# Patient Record
Sex: Female | Born: 1965
Health system: Southern US, Community
[De-identification: ages and names within clinical notes are randomized; demographics above are authoritative.]

## PROBLEM LIST (undated history)

## (undated) DIAGNOSIS — F32A Depression, unspecified: Secondary | ICD-10-CM

## (undated) DIAGNOSIS — M545 Low back pain, unspecified: Secondary | ICD-10-CM

## (undated) DIAGNOSIS — K219 Gastro-esophageal reflux disease without esophagitis: Secondary | ICD-10-CM

## (undated) DIAGNOSIS — I10 Essential (primary) hypertension: Secondary | ICD-10-CM

## (undated) DIAGNOSIS — J189 Pneumonia, unspecified organism: Secondary | ICD-10-CM

## (undated) DIAGNOSIS — Z8719 Personal history of other diseases of the digestive system: Secondary | ICD-10-CM

## (undated) DIAGNOSIS — M1990 Unspecified osteoarthritis, unspecified site: Secondary | ICD-10-CM

## (undated) DIAGNOSIS — K279 Peptic ulcer, site unspecified, unspecified as acute or chronic, without hemorrhage or perforation: Secondary | ICD-10-CM

## (undated) DIAGNOSIS — J449 Chronic obstructive pulmonary disease, unspecified: Secondary | ICD-10-CM

## (undated) DIAGNOSIS — R011 Cardiac murmur, unspecified: Secondary | ICD-10-CM

## (undated) DIAGNOSIS — Z720 Tobacco use: Secondary | ICD-10-CM

## (undated) DIAGNOSIS — E7800 Pure hypercholesterolemia, unspecified: Secondary | ICD-10-CM

## (undated) DIAGNOSIS — G8929 Other chronic pain: Secondary | ICD-10-CM

## (undated) DIAGNOSIS — R109 Unspecified abdominal pain: Secondary | ICD-10-CM

## (undated) DIAGNOSIS — I639 Cerebral infarction, unspecified: Secondary | ICD-10-CM

## (undated) DIAGNOSIS — K209 Esophagitis, unspecified: Secondary | ICD-10-CM

## (undated) DIAGNOSIS — K449 Diaphragmatic hernia without obstruction or gangrene: Secondary | ICD-10-CM

## (undated) DIAGNOSIS — K2270 Barrett's esophagus without dysplasia: Secondary | ICD-10-CM

## (undated) DIAGNOSIS — F329 Major depressive disorder, single episode, unspecified: Secondary | ICD-10-CM

## (undated) DIAGNOSIS — K861 Other chronic pancreatitis: Secondary | ICD-10-CM

## (undated) DIAGNOSIS — R0600 Dyspnea, unspecified: Secondary | ICD-10-CM

## (undated) HISTORY — DX: Cerebral infarction, unspecified: I63.9

## (undated) HISTORY — PX: FRACTURE SURGERY: SHX138

## (undated) HISTORY — PX: KNEE ARTHROSCOPY: SHX127

## (undated) HISTORY — PX: CARDIAC CATHETERIZATION: SHX172

## (undated) HISTORY — PX: PATELLA FRACTURE SURGERY: SHX735

## (undated) HISTORY — PX: LAPAROSCOPIC CHOLECYSTECTOMY: SUR755

---

## 1990-12-26 HISTORY — PX: TUBAL LIGATION: SHX77

## 2001-07-17 ENCOUNTER — Emergency Department (HOSPITAL_COMMUNITY): Admission: EM | Admit: 2001-07-17 | Discharge: 2001-07-18 | Payer: Self-pay | Admitting: *Deleted

## 2002-08-04 ENCOUNTER — Emergency Department (HOSPITAL_COMMUNITY): Admission: EM | Admit: 2002-08-04 | Discharge: 2002-08-04 | Payer: Self-pay | Admitting: Emergency Medicine

## 2003-06-13 ENCOUNTER — Emergency Department (HOSPITAL_COMMUNITY): Admission: EM | Admit: 2003-06-13 | Discharge: 2003-06-13 | Payer: Self-pay | Admitting: Emergency Medicine

## 2004-10-09 ENCOUNTER — Emergency Department (HOSPITAL_COMMUNITY): Admission: EM | Admit: 2004-10-09 | Discharge: 2004-10-09 | Payer: Self-pay | Admitting: Emergency Medicine

## 2005-03-17 ENCOUNTER — Inpatient Hospital Stay (HOSPITAL_COMMUNITY): Admission: EM | Admit: 2005-03-17 | Discharge: 2005-03-19 | Payer: Self-pay | Admitting: Emergency Medicine

## 2005-05-02 ENCOUNTER — Emergency Department (HOSPITAL_COMMUNITY): Admission: EM | Admit: 2005-05-02 | Discharge: 2005-05-03 | Payer: Self-pay | Admitting: Emergency Medicine

## 2005-07-13 ENCOUNTER — Emergency Department (HOSPITAL_COMMUNITY): Admission: EM | Admit: 2005-07-13 | Discharge: 2005-07-13 | Payer: Self-pay | Admitting: Emergency Medicine

## 2005-07-19 ENCOUNTER — Emergency Department (HOSPITAL_COMMUNITY): Admission: EM | Admit: 2005-07-19 | Discharge: 2005-07-19 | Payer: Self-pay | Admitting: Emergency Medicine

## 2005-08-15 ENCOUNTER — Emergency Department (HOSPITAL_COMMUNITY): Admission: EM | Admit: 2005-08-15 | Discharge: 2005-08-15 | Payer: Self-pay | Admitting: Emergency Medicine

## 2005-10-11 ENCOUNTER — Emergency Department (HOSPITAL_COMMUNITY): Admission: EM | Admit: 2005-10-11 | Discharge: 2005-10-12 | Payer: Self-pay | Admitting: Emergency Medicine

## 2007-01-26 ENCOUNTER — Emergency Department (HOSPITAL_COMMUNITY): Admission: EM | Admit: 2007-01-26 | Discharge: 2007-01-26 | Payer: Self-pay | Admitting: Emergency Medicine

## 2007-01-28 ENCOUNTER — Emergency Department (HOSPITAL_COMMUNITY): Admission: EM | Admit: 2007-01-28 | Discharge: 2007-01-28 | Payer: Self-pay | Admitting: Emergency Medicine

## 2007-05-25 ENCOUNTER — Emergency Department (HOSPITAL_COMMUNITY): Admission: EM | Admit: 2007-05-25 | Discharge: 2007-05-25 | Payer: Self-pay | Admitting: Emergency Medicine

## 2007-10-15 ENCOUNTER — Inpatient Hospital Stay (HOSPITAL_COMMUNITY): Admission: EM | Admit: 2007-10-15 | Discharge: 2007-10-19 | Payer: Self-pay | Admitting: Emergency Medicine

## 2007-11-27 ENCOUNTER — Emergency Department (HOSPITAL_COMMUNITY): Admission: EM | Admit: 2007-11-27 | Discharge: 2007-11-27 | Payer: Self-pay | Admitting: Emergency Medicine

## 2008-04-29 ENCOUNTER — Emergency Department (HOSPITAL_COMMUNITY): Admission: EM | Admit: 2008-04-29 | Discharge: 2008-04-29 | Payer: Self-pay | Admitting: Emergency Medicine

## 2008-06-10 ENCOUNTER — Emergency Department (HOSPITAL_COMMUNITY): Admission: EM | Admit: 2008-06-10 | Discharge: 2008-06-10 | Payer: Self-pay | Admitting: Emergency Medicine

## 2008-08-12 ENCOUNTER — Emergency Department (HOSPITAL_COMMUNITY): Admission: EM | Admit: 2008-08-12 | Discharge: 2008-08-12 | Payer: Self-pay | Admitting: Emergency Medicine

## 2009-02-20 ENCOUNTER — Emergency Department (HOSPITAL_COMMUNITY): Admission: EM | Admit: 2009-02-20 | Discharge: 2009-02-20 | Payer: Self-pay | Admitting: Emergency Medicine

## 2009-07-02 ENCOUNTER — Emergency Department (HOSPITAL_COMMUNITY): Admission: EM | Admit: 2009-07-02 | Discharge: 2009-07-02 | Payer: Self-pay | Admitting: Emergency Medicine

## 2009-08-13 ENCOUNTER — Emergency Department (HOSPITAL_COMMUNITY): Admission: EM | Admit: 2009-08-13 | Discharge: 2009-08-13 | Payer: Self-pay | Admitting: Emergency Medicine

## 2009-09-22 ENCOUNTER — Emergency Department (HOSPITAL_COMMUNITY): Admission: EM | Admit: 2009-09-22 | Discharge: 2009-09-22 | Payer: Self-pay | Admitting: Emergency Medicine

## 2009-09-23 ENCOUNTER — Ambulatory Visit (HOSPITAL_COMMUNITY): Admission: RE | Admit: 2009-09-23 | Discharge: 2009-09-23 | Payer: Self-pay | Admitting: Emergency Medicine

## 2009-10-23 ENCOUNTER — Emergency Department (HOSPITAL_COMMUNITY): Admission: EM | Admit: 2009-10-23 | Discharge: 2009-10-23 | Payer: Self-pay | Admitting: Emergency Medicine

## 2009-10-23 ENCOUNTER — Other Ambulatory Visit: Admission: RE | Admit: 2009-10-23 | Discharge: 2009-10-23 | Payer: Self-pay | Admitting: Obstetrics & Gynecology

## 2009-11-29 ENCOUNTER — Emergency Department (HOSPITAL_COMMUNITY): Admission: EM | Admit: 2009-11-29 | Discharge: 2009-11-30 | Payer: Self-pay | Admitting: Emergency Medicine

## 2010-06-23 ENCOUNTER — Emergency Department (HOSPITAL_COMMUNITY): Admission: EM | Admit: 2010-06-23 | Discharge: 2010-06-23 | Payer: Self-pay | Admitting: Emergency Medicine

## 2010-08-21 ENCOUNTER — Emergency Department (HOSPITAL_COMMUNITY): Admission: EM | Admit: 2010-08-21 | Discharge: 2010-08-22 | Payer: Self-pay | Admitting: Emergency Medicine

## 2010-08-30 ENCOUNTER — Emergency Department (HOSPITAL_COMMUNITY): Admission: EM | Admit: 2010-08-30 | Discharge: 2010-08-30 | Payer: Self-pay | Admitting: Emergency Medicine

## 2010-09-25 HISTORY — PX: ESOPHAGOGASTRODUODENOSCOPY: SHX1529

## 2010-10-06 ENCOUNTER — Inpatient Hospital Stay (HOSPITAL_COMMUNITY): Admission: EM | Admit: 2010-10-06 | Discharge: 2010-10-09 | Payer: Self-pay | Admitting: Emergency Medicine

## 2010-10-07 ENCOUNTER — Ambulatory Visit: Payer: Self-pay | Admitting: Internal Medicine

## 2010-10-08 ENCOUNTER — Encounter (INDEPENDENT_AMBULATORY_CARE_PROVIDER_SITE_OTHER): Payer: Self-pay | Admitting: Internal Medicine

## 2010-10-29 ENCOUNTER — Emergency Department (HOSPITAL_COMMUNITY): Admission: EM | Admit: 2010-10-29 | Discharge: 2010-10-29 | Payer: Self-pay | Admitting: Emergency Medicine

## 2010-11-10 ENCOUNTER — Emergency Department (HOSPITAL_COMMUNITY): Admission: EM | Admit: 2010-11-10 | Discharge: 2010-11-10 | Payer: Self-pay | Admitting: Emergency Medicine

## 2010-11-12 ENCOUNTER — Emergency Department (HOSPITAL_COMMUNITY): Admission: EM | Admit: 2010-11-12 | Discharge: 2010-11-12 | Payer: Self-pay | Admitting: Emergency Medicine

## 2010-11-30 ENCOUNTER — Emergency Department (HOSPITAL_COMMUNITY)
Admission: EM | Admit: 2010-11-30 | Discharge: 2010-11-30 | Payer: Self-pay | Source: Home / Self Care | Admitting: Emergency Medicine

## 2010-12-06 ENCOUNTER — Ambulatory Visit: Payer: Self-pay | Admitting: Internal Medicine

## 2010-12-10 ENCOUNTER — Inpatient Hospital Stay (HOSPITAL_COMMUNITY): Admission: EM | Admit: 2010-12-10 | Discharge: 2010-12-12 | Payer: Self-pay | Source: Home / Self Care

## 2011-01-17 ENCOUNTER — Emergency Department (HOSPITAL_COMMUNITY)
Admission: EM | Admit: 2011-01-17 | Discharge: 2011-01-17 | Payer: Self-pay | Source: Home / Self Care | Admitting: Emergency Medicine

## 2011-03-07 LAB — DIFFERENTIAL
Basophils Relative: 0 % (ref 0–1)
Basophils Relative: 0 % (ref 0–1)
Eosinophils Absolute: 0.2 10*3/uL (ref 0.0–0.7)
Eosinophils Absolute: 0.3 10*3/uL (ref 0.0–0.7)
Eosinophils Relative: 3 % (ref 0–5)
Lymphocytes Relative: 27 % (ref 12–46)
Lymphs Abs: 2.2 10*3/uL (ref 0.7–4.0)
Monocytes Absolute: 0.5 10*3/uL (ref 0.1–1.0)
Monocytes Relative: 6 % (ref 3–12)
Monocytes Relative: 6 % (ref 3–12)
Neutrophils Relative %: 64 % (ref 43–77)

## 2011-03-07 LAB — COMPREHENSIVE METABOLIC PANEL
ALT: 16 U/L (ref 0–35)
ALT: 11 U/L (ref 0–35)
Albumin: 4.1 g/dL (ref 3.5–5.2)
Alkaline Phosphatase: 42 U/L (ref 39–117)
BUN: 12 mg/dL (ref 6–23)
CO2: 24 mEq/L (ref 19–32)
Calcium: 8.7 mg/dL (ref 8.4–10.5)
Chloride: 101 mEq/L (ref 96–112)
Creatinine, Ser: 0.75 mg/dL (ref 0.4–1.2)
Glucose, Bld: 100 mg/dL — ABNORMAL HIGH (ref 70–99)
Glucose, Bld: 92 mg/dL (ref 70–99)
Potassium: 3.4 mEq/L — ABNORMAL LOW (ref 3.5–5.1)
Sodium: 134 mEq/L — ABNORMAL LOW (ref 135–145)
Sodium: 139 mEq/L (ref 135–145)
Total Protein: 7.3 g/dL (ref 6.0–8.3)

## 2011-03-07 LAB — URINALYSIS, ROUTINE W REFLEX MICROSCOPIC
Bilirubin Urine: NEGATIVE
Bilirubin Urine: NEGATIVE
Glucose, UA: NEGATIVE mg/dL
Ketones, ur: NEGATIVE mg/dL
Ketones, ur: NEGATIVE mg/dL
Leukocytes, UA: NEGATIVE
Nitrite: NEGATIVE
Protein, ur: NEGATIVE mg/dL
Urobilinogen, UA: 0.2 mg/dL (ref 0.0–1.0)
pH: 6 (ref 5.0–8.0)
pH: 6 (ref 5.0–8.0)

## 2011-03-07 LAB — LIPID PANEL
Triglycerides: 349 mg/dL — ABNORMAL HIGH (ref <150)
VLDL: 70 mg/dL — ABNORMAL HIGH (ref 0–40)

## 2011-03-07 LAB — CBC
HCT: 34.9 % — ABNORMAL LOW (ref 36.0–46.0)
HCT: 35.9 % — ABNORMAL LOW (ref 36.0–46.0)
Hemoglobin: 12.2 g/dL (ref 12.0–15.0)
MCH: 30.9 pg (ref 26.0–34.0)
MCHC: 35 g/dL (ref 30.0–36.0)
MCV: 88.4 fL (ref 78.0–100.0)
Platelets: 387 10*3/uL (ref 150–400)
Platelets: 420 10*3/uL — ABNORMAL HIGH (ref 150–400)
RDW: 13.3 % (ref 11.5–15.5)
RDW: 13.7 % (ref 11.5–15.5)
WBC: 8.7 10*3/uL (ref 4.0–10.5)

## 2011-03-07 LAB — BASIC METABOLIC PANEL
Chloride: 106 mEq/L (ref 96–112)
Creatinine, Ser: 0.75 mg/dL (ref 0.4–1.2)
GFR calc Af Amer: >60 mL/min (ref >60)
Sodium: 138 mEq/L (ref 135–145)

## 2011-03-07 LAB — RAPID URINE DRUG SCREEN, HOSP PERFORMED
Amphetamines: NOT DETECTED
Benzodiazepines: POSITIVE — AB
Tetrahydrocannabinol: NOT DETECTED

## 2011-03-07 LAB — LIPASE, BLOOD: Lipase: 42 U/L (ref 11–59)

## 2011-03-07 LAB — LACTATE DEHYDROGENASE: LDH: 120 U/L (ref 94–250)

## 2011-03-07 LAB — URINE MICROSCOPIC-ADD ON

## 2011-03-08 LAB — COMPREHENSIVE METABOLIC PANEL
ALT: 19 U/L (ref 0–35)
ALT: 23 U/L (ref 0–35)
AST: 13 U/L (ref 0–37)
AST: 20 U/L (ref 0–37)
Albumin: 3.8 g/dL (ref 3.5–5.2)
Albumin: 4 g/dL (ref 3.5–5.2)
Alkaline Phosphatase: 37 U/L — ABNORMAL LOW (ref 39–117)
BUN: 12 mg/dL (ref 6–23)
CO2: 22 mEq/L (ref 19–32)
CO2: 27 mEq/L (ref 19–32)
Calcium: 9.1 mg/dL (ref 8.4–10.5)
Calcium: 9.4 mg/dL (ref 8.4–10.5)
Chloride: 104 mEq/L (ref 96–112)
Creatinine, Ser: 0.7 mg/dL (ref 0.4–1.2)
Creatinine, Ser: 0.74 mg/dL (ref 0.4–1.2)
Creatinine, Ser: 0.77 mg/dL (ref 0.4–1.2)
GFR calc Af Amer: >60 mL/min (ref >60)
GFR calc non Af Amer: >60 mL/min (ref >60)
GFR calc non Af Amer: >60 mL/min (ref >60)
GFR calc non Af Amer: >60 mL/min (ref >60)
Glucose, Bld: 81 mg/dL (ref 70–99)
Potassium: 3.8 mEq/L (ref 3.5–5.1)
Sodium: 136 mEq/L (ref 135–145)
Sodium: 138 mEq/L (ref 135–145)
Total Bilirubin: 0.1 mg/dL — ABNORMAL LOW (ref 0.3–1.2)
Total Bilirubin: 0.3 mg/dL (ref 0.3–1.2)
Total Protein: 7 g/dL (ref 6.0–8.3)

## 2011-03-08 LAB — URINALYSIS, ROUTINE W REFLEX MICROSCOPIC
Bilirubin Urine: NEGATIVE
Bilirubin Urine: NEGATIVE
Glucose, UA: NEGATIVE mg/dL
Hgb urine dipstick: NEGATIVE
Ketones, ur: NEGATIVE mg/dL
Ketones, ur: NEGATIVE mg/dL
Nitrite: NEGATIVE
Protein, ur: NEGATIVE mg/dL
Specific Gravity, Urine: 1.005 (ref 1.005–1.030)
Specific Gravity, Urine: 1.015 (ref 1.005–1.030)
Urobilinogen, UA: 0.2 mg/dL (ref 0.0–1.0)
pH: 5.5 (ref 5.0–8.0)

## 2011-03-08 LAB — PREGNANCY, URINE: Preg Test, Ur: NEGATIVE

## 2011-03-08 LAB — DIFFERENTIAL
Basophils Absolute: 0.1 10*3/uL (ref 0.0–0.1)
Basophils Absolute: 0.1 10*3/uL (ref 0.0–0.1)
Basophils Relative: 1 % (ref 0–1)
Basophils Relative: 1 % (ref 0–1)
Eosinophils Absolute: 0.2 10*3/uL (ref 0.0–0.7)
Eosinophils Absolute: 0.2 10*3/uL (ref 0.0–0.7)
Eosinophils Relative: 2 % (ref 0–5)
Eosinophils Relative: 3 % (ref 0–5)
Lymphocytes Relative: 30 % (ref 12–46)
Lymphocytes Relative: 42 % (ref 12–46)
Lymphs Abs: 2.7 10*3/uL (ref 0.7–4.0)
Lymphs Abs: 3.1 10*3/uL (ref 0.7–4.0)
Monocytes Absolute: 0.6 10*3/uL (ref 0.1–1.0)
Monocytes Absolute: 0.7 10*3/uL (ref 0.1–1.0)
Monocytes Relative: 6 % (ref 3–12)
Neutro Abs: 3.9 10*3/uL (ref 1.7–7.7)
Neutrophils Relative %: 56 % (ref 43–77)

## 2011-03-08 LAB — CBC
HCT: 36.5 % (ref 36.0–46.0)
Hemoglobin: 11.6 g/dL — ABNORMAL LOW (ref 12.0–15.0)
Hemoglobin: 12.3 g/dL (ref 12.0–15.0)
Hemoglobin: 12.4 g/dL (ref 12.0–15.0)
MCH: 30.7 pg (ref 26.0–34.0)
MCH: 30.8 pg (ref 26.0–34.0)
MCH: 30.9 pg (ref 26.0–34.0)
MCHC: 33.8 g/dL (ref 30.0–36.0)
MCHC: 34 g/dL (ref 30.0–36.0)
MCV: 89.7 fL (ref 78.0–100.0)
Platelets: 486 10*3/uL — ABNORMAL HIGH (ref 150–400)
RBC: 3.77 MIL/uL — ABNORMAL LOW (ref 3.87–5.11)
WBC: 8.5 10*3/uL (ref 4.0–10.5)

## 2011-03-08 LAB — LIPASE, BLOOD
Lipase: 34 U/L (ref 11–59)
Lipase: 47 U/L (ref 11–59)
Lipase: 57 U/L (ref 11–59)

## 2011-03-09 LAB — CBC
HCT: 40 % (ref 36.0–46.0)
Hemoglobin: 11.6 g/dL — ABNORMAL LOW (ref 12.0–15.0)
MCH: 31.3 pg (ref 26.0–34.0)
MCH: 31.7 pg (ref 26.0–34.0)
MCHC: 34.2 g/dL (ref 30.0–36.0)
MCHC: 34.3 g/dL (ref 30.0–36.0)
MCHC: 34.4 g/dL (ref 30.0–36.0)
Platelets: 372 10*3/uL (ref 150–400)
Platelets: 357 10*3/uL (ref 150–400)
RBC: 3.62 MIL/uL — ABNORMAL LOW (ref 3.87–5.11)
RBC: 3.53 MIL/uL — ABNORMAL LOW (ref 3.87–5.11)
RBC: 3.69 MIL/uL — ABNORMAL LOW (ref 3.87–5.11)
RDW: 13.8 % (ref 11.5–15.5)

## 2011-03-09 LAB — RAPID URINE DRUG SCREEN, HOSP PERFORMED
Amphetamines: NOT DETECTED
Benzodiazepines: POSITIVE — AB
Cocaine: POSITIVE — AB
Tetrahydrocannabinol: NOT DETECTED

## 2011-03-09 LAB — URINALYSIS, ROUTINE W REFLEX MICROSCOPIC
Bilirubin Urine: NEGATIVE
Ketones, ur: NEGATIVE mg/dL
Nitrite: NEGATIVE
Protein, ur: NEGATIVE mg/dL
Urobilinogen, UA: 0.2 mg/dL (ref 0.0–1.0)

## 2011-03-09 LAB — DIFFERENTIAL
Basophils Relative: 1 % (ref 0–1)
Basophils Relative: 0 % (ref 0–1)
Basophils Relative: 1 % (ref 0–1)
Eosinophils Absolute: 0.2 10*3/uL (ref 0.0–0.7)
Eosinophils Absolute: 0.2 10*3/uL (ref 0.0–0.7)
Lymphs Abs: 2.5 10*3/uL (ref 0.7–4.0)
Lymphs Abs: 2.7 10*3/uL (ref 0.7–4.0)
Monocytes Relative: 5 % (ref 3–12)
Monocytes Relative: 6 % (ref 3–12)
Neutro Abs: 3.6 10*3/uL (ref 1.7–7.7)
Neutro Abs: 4.6 10*3/uL (ref 1.7–7.7)
Neutro Abs: 5.2 10*3/uL (ref 1.7–7.7)
Neutrophils Relative %: 53 % (ref 43–77)
Neutrophils Relative %: 56 % (ref 43–77)
Neutrophils Relative %: 57 % (ref 43–77)
Neutrophils Relative %: 63 % (ref 43–77)

## 2011-03-09 LAB — COMPREHENSIVE METABOLIC PANEL
ALT: 18 U/L (ref 0–35)
BUN: 9 mg/dL (ref 6–23)
Calcium: 9.6 mg/dL (ref 8.4–10.5)
Glucose, Bld: 95 mg/dL (ref 70–99)
Sodium: 137 mEq/L (ref 135–145)
Total Protein: 7.5 g/dL (ref 6.0–8.3)

## 2011-03-09 LAB — WET PREP, GENITAL
Trich, Wet Prep: NONE SEEN
Yeast Wet Prep HPF POC: NONE SEEN

## 2011-03-09 LAB — PREGNANCY, URINE: Preg Test, Ur: NEGATIVE

## 2011-03-09 LAB — TSH: TSH: 1.701 u[IU]/mL (ref 0.350–4.500)

## 2011-03-09 LAB — BASIC METABOLIC PANEL
CO2: 28 mEq/L (ref 19–32)
CO2: 25 mEq/L (ref 19–32)
Calcium: 8.9 mg/dL (ref 8.4–10.5)
Calcium: 8.4 mg/dL (ref 8.4–10.5)
Calcium: 8.4 mg/dL (ref 8.4–10.5)
Creatinine, Ser: 0.78 mg/dL (ref 0.4–1.2)
Creatinine, Ser: 0.76 mg/dL (ref 0.4–1.2)
GFR calc Af Amer: >60 mL/min (ref >60)
GFR calc Af Amer: >60 mL/min (ref >60)
GFR calc Af Amer: >60 mL/min (ref >60)
GFR calc non Af Amer: >60 mL/min (ref >60)
GFR calc non Af Amer: >60 mL/min (ref >60)
GFR calc non Af Amer: >60 mL/min (ref >60)
Glucose, Bld: 97 mg/dL (ref 70–99)
Glucose, Bld: 99 mg/dL (ref 70–99)
Sodium: 139 mEq/L (ref 135–145)
Sodium: 140 mEq/L (ref 135–145)

## 2011-03-09 LAB — GC/CHLAMYDIA PROBE AMP, GENITAL: Chlamydia, DNA Probe: NEGATIVE

## 2011-03-09 LAB — AMYLASE
Amylase: 124 U/L — ABNORMAL HIGH (ref 0–105)
Amylase: 54 U/L (ref 0–105)

## 2011-03-09 LAB — ALT: ALT: 12 U/L (ref 0–35)

## 2011-03-09 LAB — URINE CULTURE

## 2011-03-09 LAB — LIPASE, BLOOD: Lipase: 25 U/L (ref 11–59)

## 2011-03-10 LAB — URINALYSIS, ROUTINE W REFLEX MICROSCOPIC
Leukocytes, UA: NEGATIVE
Nitrite: NEGATIVE
Protein, ur: NEGATIVE mg/dL
Urobilinogen, UA: 0.2 mg/dL (ref 0.0–1.0)

## 2011-03-10 LAB — URINE MICROSCOPIC-ADD ON

## 2011-03-13 LAB — CBC
HCT: 37 % (ref 36.0–46.0)
MCHC: 34.4 g/dL (ref 30.0–36.0)
MCV: 92 fL (ref 78.0–100.0)
RDW: 13.8 % (ref 11.5–15.5)
WBC: 13.1 10*3/uL — ABNORMAL HIGH (ref 4.0–10.5)

## 2011-03-13 LAB — CK TOTAL AND CKMB (NOT AT ARMC): CK, MB: 1 ng/mL (ref 0.3–4.0)

## 2011-03-13 LAB — DIFFERENTIAL
Basophils Absolute: 0 10*3/uL (ref 0.0–0.1)
Basophils Relative: 0 % (ref 0–1)
Eosinophils Relative: 1 % (ref 0–5)
Monocytes Absolute: 0.1 10*3/uL (ref 0.1–1.0)
Neutro Abs: 11.8 10*3/uL — ABNORMAL HIGH (ref 1.7–7.7)

## 2011-03-13 LAB — URINALYSIS, ROUTINE W REFLEX MICROSCOPIC
Bilirubin Urine: NEGATIVE
Hgb urine dipstick: NEGATIVE
Ketones, ur: NEGATIVE mg/dL
Protein, ur: NEGATIVE mg/dL
Urobilinogen, UA: 0.2 mg/dL (ref 0.0–1.0)

## 2011-03-13 LAB — COMPREHENSIVE METABOLIC PANEL
Albumin: 3.8 g/dL (ref 3.5–5.2)
Alkaline Phosphatase: 49 U/L (ref 39–117)
BUN: 9 mg/dL (ref 6–23)
Chloride: 105 mEq/L (ref 96–112)
Potassium: 3.1 mEq/L — ABNORMAL LOW (ref 3.5–5.1)
Total Bilirubin: 0.5 mg/dL (ref 0.3–1.2)

## 2011-04-01 LAB — CBC
HCT: 36.3 % (ref 36.0–46.0)
MCHC: 35.1 g/dL (ref 30.0–36.0)
MCV: 93.1 fL (ref 78.0–100.0)
Platelets: 358 10*3/uL (ref 150–400)

## 2011-04-01 LAB — WET PREP, GENITAL
Trich, Wet Prep: NONE SEEN
WBC, Wet Prep HPF POC: NONE SEEN

## 2011-04-01 LAB — BASIC METABOLIC PANEL
BUN: 9 mg/dL (ref 6–23)
CO2: 28 mEq/L (ref 19–32)
Chloride: 109 mEq/L (ref 96–112)
Creatinine, Ser: 0.67 mg/dL (ref 0.4–1.2)

## 2011-04-01 LAB — PREGNANCY, URINE: Preg Test, Ur: NEGATIVE

## 2011-04-01 LAB — URINALYSIS, ROUTINE W REFLEX MICROSCOPIC
Bilirubin Urine: NEGATIVE
Glucose, UA: NEGATIVE mg/dL
Hgb urine dipstick: NEGATIVE
Ketones, ur: NEGATIVE mg/dL
Protein, ur: NEGATIVE mg/dL

## 2011-04-01 LAB — DIFFERENTIAL
Basophils Relative: 1 % (ref 0–1)
Eosinophils Absolute: 0.2 10*3/uL (ref 0.0–0.7)
Eosinophils Relative: 2 % (ref 0–5)
Monocytes Relative: 4 % (ref 3–12)
Neutrophils Relative %: 68 % (ref 43–77)

## 2011-04-01 LAB — SAMPLE TO BLOOD BANK

## 2011-04-17 ENCOUNTER — Emergency Department (HOSPITAL_COMMUNITY)
Admission: EM | Admit: 2011-04-17 | Discharge: 2011-04-17 | Disposition: A | Discharge status: Home / Self Care | Payer: Self-pay | Attending: Emergency Medicine | Admitting: Emergency Medicine

## 2011-04-17 DIAGNOSIS — M545 Low back pain, unspecified: Secondary | ICD-10-CM | POA: Insufficient documentation

## 2011-04-17 DIAGNOSIS — K219 Gastro-esophageal reflux disease without esophagitis: Secondary | ICD-10-CM | POA: Insufficient documentation

## 2011-05-04 ENCOUNTER — Emergency Department (HOSPITAL_COMMUNITY)
Admission: EM | Admit: 2011-05-04 | Discharge: 2011-05-04 | Disposition: A | Discharge status: Home / Self Care | Payer: Self-pay | Attending: Emergency Medicine | Admitting: Emergency Medicine

## 2011-05-04 DIAGNOSIS — K047 Periapical abscess without sinus: Secondary | ICD-10-CM | POA: Insufficient documentation

## 2011-05-04 DIAGNOSIS — K219 Gastro-esophageal reflux disease without esophagitis: Secondary | ICD-10-CM | POA: Insufficient documentation

## 2011-05-10 NOTE — Discharge Summary (Signed)
Rita [name], Rita [name]                 ACCOUNT NO.:  [id number]   MEDICAL RECORD NO.:  [id number]          PATIENT TYPE:  INP   LOCATION:  A319                          FACILITY:  [facility]   PHYSICIAN:  Marcello Moores, MD   DATE OF BIRTH:  1966/04/24   DATE OF ADMISSION:  10/15/2007  DATE OF DISCHARGE:  10/24/2008LH                               DISCHARGE SUMMARY   PMD unassigned.  She will be assigned when we discharge her, and she  will have follow-up with her PMD.   DISCHARGE DIAGNOSES:  1. Left-sided pyelonephritis.  2. Right small ovarian cyst.   DISCHARGE MEDICATIONS:  1. Levaquin 500 mg p.o. for seven days.  2. Potassium chloride 10 mEq p.o. daily for five days.  3. Percocet 5/500 mg p.o. every 6 hours p.r.n. for five days.   HOSPITAL COURSE:  Ms. Tangredi is a 45 year old female patient with no  significant past medical history.  She presented with abdominal pain,  and she was investigated with CT scan of the abdomen and pelvis which  showed left-sided pyelonephritis without abscess or mass, and she had  leukocytosis of 14,000 during admission, which was resolved, and she was  put on Levaquin IV, and the patient responded well.  Her pain decreased,  and she became asymptomatic, and she remained afebrile.  Blood culture,  which was two bottles negative and one bottle positive for Escherichia  coli, repeated yesterday and became negative, and urine culture is  negative, and she is ready to go home today, and she will have follow-up  with her PMD.   PHYSICAL EXAMINATION:  GENERAL:  Today, she is fairly stable.  VITAL SIGNS:  Temperature is 97.8, and pulse is 80, respiratory rate 20,  blood pressure is 139/86, and saturation 99%.  HEENT: She has pink conjunctivae, nonicteric sclera.  NECK:  Supple.  CHEST:  She has good air entry.  CVS:  S1-S2 regular were heard.  ABDOMEN:  Soft.  No area of tenderness.  EXTREMITIES:  She has no pedal edema.  CNS: She is alert and well  oriented.   On the chemistry today, sodium is 142, potassium is 3.2, chloride 106,  bicarbonate is 32, glucose is 95, BUN 1, creatinine 0.6, and calcium is  8.3.   DISCHARGE PLAN:  The patient will be discharged today after potassium  level is repeated and if it is within normal range, and she is strongly  advised to have follow-up with PMD in the coming 3-4 days as scheduled  as per the schedule of [facility].      Marcello Moores, MD  Electronically Signed     MT/MEDQ  D:  10/19/2007  T:  10/21/2007  Job:  [id number]

## 2011-05-10 NOTE — H&P (Signed)
Rita [name], Rita [name]                 ACCOUNT NO.:  [id number]   MEDICAL RECORD NO.:  [id number]          PATIENT TYPE:  INP   LOCATION:  A319                          FACILITY:  [facility]   PHYSICIAN:  Dorris Singh, DO    DATE OF BIRTH:  1966-02-02   DATE OF ADMISSION:  10/15/2007  DATE OF DISCHARGE:  LH                              HISTORY & PHYSICAL   The patient is a 45 year old female that presented to the [facility]  emergency room, with a progressive history of abdominal pain, stated  that the onset was acute.  She had multiple episodes that did not go  away.  It was noted as being stabbing and sharp and was rated at 7/10.  While in the emergency room, it was found that she had a left-sided  pyelonephrosis.   PAST MEDICAL HISTORY:  Includes LMP of September 30, 2007.  She has no  medical history.   SURGICAL HISTORY:  Cesarean section and a cholecystectomy.  She is a non-  drinker but does smoke a pack a day.  Also, her other surgery is a  bilateral tubal ligation.   ALLERGIES:  THE PATIENT DENIES ANY KNOWN DRUG ALLERGIES AT THIS POINT IN  TIME.   MEDICATIONS:  She is not on any medications.   REVIEW OF SYSTEMS:  GENERAL:  Negative for weight loss but positive for  weakness, appetite changes and chills.  HEENT:  Eyes:  Negative for eye  pain, changes in vision or discharge.  Ear, nose and throat negative for  ear pain, hearing loss, hoarseness, nasal discharge, sore throat or  sinus pressure.  CARDIOVASCULAR:  Negative for chest pain, palpitations,  bradycardia.  RESPIRATORY:  Negative for cough, dyspnea, or wheezing.  GASTROINTESTINAL:  Positive for nausea, vomiting, abdominal pain.  GU:  Positive for flank pain and suprapubic pain.  MUSCULOSKELETAL:  Negative  for arthralgias or back pain.  SKIN:  Negative for rashes or blistering.  NEURO:  Negative for headache, confusion, or altered mental status.  METABOLIC:  Negative for thirst or cold.  HEMATOLOGIC:  Negative for  anemia.   PHYSICAL EXAMINATION:  VITAL SIGNS:  Temperature 98.1, pulse 83,  respirations 20, blood pressure 110/77.  GENERAL:  This is a 45 year old female who is well-developed, well-  nourished, in no acute distress.  SKIN:  Good turgor, good texture, intact.  HEENT:  Normocephalic, atraumatic.  Eyes:  PERRLA, EOMI.  No scleral  icterus or conjunctival discharge.  Ears:  TMIs visualized bilaterally.  Gross auditory acuity intact.  Nose:  Symmetrical.  No discharge,  turbinate inflammation.  Throat:  Fair hygiene.  No erythema or exudate  noted.  NECK:  No masses, full range of motion.  HEART:  Regular rate and rhythm.  No gallops, rubs or murmurs.  LUNGS:  Clear to auscultation bilaterally.  CHEST:  Symmetrical with respirations.  ABDOMEN:  Positive tenderness in right and left lower quadrant, with  also CVA tenderness on the left side.  MUSCULOSKELETAL:  No muscular atrophy, full range of motion.  NEUROLOGIC:  Cranial nerves 2-12 grossly intact.   LABORATORY:  As follows:  Her white count max was 14.4.  Today it is  10.3.  Hemoglobin 11.0, hematocrit 31.9 and platelet count of 305.  Her  electrolytes:  Sodium 140, potassium 2.9, chloride 107, CO2 27, glucose  122, BUN 5 and creatinine of 0.72.  She had testing done which includes  on the 20th.  CT of the abdomen with contrast, which shows findings are  consistent with acute left-sided pyelonephritis, no abscess is noted,  and then she also had a CT of the pelvis with contrast, no acute pelvic  changes.  She had an ultrasound of the pelvis which demonstrated no  significant findings.  There is a small cyst of the right ovary, and  then she had a transvaginal non-OB, which is the same thing.  We have  two reports for that.   ASSESSMENT/PLAN:  1. Left-sided pyelonephritis.  2. Hypokalemia.   PLAN:  Plan will be to admit her to the service of [facility].  Also,  patient was given Levaquin 500 mg in the ED.  Will go ahead and  continue  with that IV.  Will also do appropriate DVT prophylaxis, as well as GI  prophylaxis.  Will continue with Levaquin 500 mg.  Also, received a  blood culture on her just now that was positive for gram-negative rods,  so will continue with the Levaquin as noted and continue with IV  hydration.      Dorris Singh, DO  Electronically Signed     CB/MEDQ  D:  10/16/2007  T:  10/16/2007  Job:  [id number]

## 2011-05-13 NOTE — Op Note (Signed)
Rita [name], Rita [name]                 ACCOUNT NO.:  [id number]   MEDICAL RECORD NO.:  [id number]          PATIENT TYPE:  INP   LOCATION:  A304                          FACILITY:  [facility]   PHYSICIAN:  Dalia Heading, M.D.  DATE OF BIRTH:  Jan 26, 1966   DATE OF PROCEDURE:  03/18/2005  DATE OF DISCHARGE:                                 OPERATIVE REPORT   PREOPERATIVE DIAGNOSIS:  Cholecystitis, cholelithiasis, pancreatitis.   POSTOPERATIVE DIAGNOSIS:  Cholecystitis, cholelithiasis, pancreatitis.   PROCEDURE:  Laparoscopic cholecystectomy with cholangiogram.   SURGEON:  Dr. Franky Macho.   ASSISTANT:  Dr. Arna Snipe.   ANESTHESIA:  General endotracheal.   INDICATIONS:  The patient is a 45 year old white female who presents with  cholecystitis, cholelithiasis, and gallstone pancreatitis. Risks and  benefits of the procedure including bleeding, infection, hepatobiliary  injury, and the possibility an open procedure were fully explained to the  patient, who gave informed consent.   PROCEDURE NOTE:  The patient was placed in supine position. After induction  of general endotracheal anesthesia, the abdomen was prepped and draped using  the usual sterile technique with Betadine. Surgical site confirmation was  performed.   A supraumbilical incision was made down to fascia. A Veress needle was  introduced into the abdominal cavity and confirmation of placement was done  using the saline drop test. The abdomen was then insufflated to 16 mmHg  pressure. An 11-mm trocar was introduced into the abdominal cavity under  direct visualization without difficulty. The patient was placed in reversed  Trendelenburg position. Additional 11-mm trocar was placed epigastric  region, and 5-mm trocars placed in the right upper quadrant and right flank  regions. The liver was inspected and noted to normal limits. The gallbladder  was retracted superior laterally. Dissection was begun around the  infundibulum of the gallbladder. The cystic duct was first identified. Its  juncture to the infundibulum fully identified. A single Endoclip was placed  proximally on cystic duct. An incision was made in the cystic duct, and the  cholangiocatheter was then inserted. Cholangiography was then performed  using digital fluoroscopy. The common bile duct was noted to be within  normal limits. No filling defects were noted. The dye flowed freely into the  duodenum. This system was flushed with saline, and the cholangiocatheter  removed. Multiple Endoclips were placed distally on the cystic duct and  cystic duct was divided. Cystic artery was likewise ligated and divided. The  gallbladder freed away from the gallbladder fossa using Bovie  electrocautery. The gallbladder was delivered through the epigastric trocar  site using EndoCatch bag. The gallbladder fossa was inspected. No abnormal  bleeding or bile leakage was noted. Surgicel was placed in the gallbladder  fossa. All fluid and air then evacuated from the abdominal cavity prior to  removal of the trocars.   All wounds were irrigated with normal saline. Wounds were injected with 0.5%  Sensorcaine. The supraumbilical fascia was reapproximated using a 0 Vicryl  interrupted suture. All skin incisions were closed using staples. Betadine  ointment and dry sterile dressings were applied.   All  tape and needle counts were correct in the procedure. The patient was  extubated in the operating room and went back to recovery room awake in  stable condition.   COMPLICATIONS:  None.   SPECIMEN:  Gallbladder with stones.   BLOOD LOSS:  Minimal.      MAJ/MEDQ  D:  03/18/2005  T:  03/18/2005  Job:  [id number]   cc:   Rita [name], M.D.  [address]  [place]  [place] [address]  Fax: [phone]

## 2011-05-13 NOTE — H&P (Signed)
NAMETRUDA, STAUB NO.:  [id number]   MEDICAL RECORD NO.:  [id number]          PATIENT TYPE:  EMS   LOCATION:  ED                            FACILITY:  [facility]   PHYSICIAN:  Dalia Heading, M.D.  DATE OF BIRTH:  07/20/1966   DATE OF ADMISSION:  03/17/2005  DATE OF DISCHARGE:  LH                                HISTORY & PHYSICAL   CHIEF COMPLAINT:  Cholecystitis, cholelithiasis.   HISTORY OF PRESENT ILLNESS:  The patient is a 45 year old white female who  presents with intermittent episodes of right upper quadrant abdominal pain  and nausea.  She presented to the emergency room today due to worsening  abdominal pain.  An ultrasound of the gallbladder was performed, which  revealed cholelithiasis with a dilated common bile duct.  The patient is now  being admitted for further evaluation and treatment.   PAST MEDICAL HISTORY:  Unremarkable.   PAST SURGICAL HISTORY:  Tubal ligation, C-section.   CURRENT MEDICATIONS:  None.   ALLERGIES:  No known drug allergies.   REVIEW OF SYSTEMS:  The patient does smoke.  She denies any significant  alcohol use.   PHYSICAL EXAMINATION:  GENERAL:  The patient is a well-developed, well-  nourished white female in no acute distress.  VITAL SIGNS:  She is afebrile, and vital signs are stable.  HEENT:  No scleral icterus.  CHEST:  Lungs are clear to auscultation with equal breath sounds  bilaterally.  CARDIAC:  Regular rate and rhythm without S3, S4 or murmurs.  ABDOMEN:  Soft with tenderness down the right upper quadrant to palpation.  No hepatosplenomegaly or masses are noted.   White blood cell count 10.0, hematocrit 41, platelet count 543,000.  MET-7  is within normal limits. SGOT is 608, SGPT 539, alkaline phosphatase 114,  total bilirubin was 1.6.  Lipase 114.  INR is within normal limits.   IMPRESSION:  Cholecystitis, cholelithiasis, dilated common bile duct.   PLAN:  The patient is scheduled for a laparoscopic  cholecystectomy with  cholangiograms tomorrow.  The risks and benefits of the procedure including  bleeding, infection, hepatobiliary injury, and the possibility of an open  procedure, were fully explained to the patient, who gave informed consent.      MAJ/MEDQ  D:  03/17/2005  T:  03/17/2005  Job:  [id number]   cc:   [name], M.D.  [address]  [place]  [place] [address]  Fax: [phone]

## 2011-05-13 NOTE — Discharge Summary (Signed)
NAMELASHICA, [name] NO.:  [id number]   MEDICAL RECORD NO.:  [id number]          PATIENT TYPE:  INP   LOCATION:  A304                          FACILITY:  [facility]   PHYSICIAN:  Dalia Heading, M.D.  DATE OF BIRTH:  04-14-1966   DATE OF ADMISSION:  03/17/2005  DATE OF DISCHARGE:  03/25/2006LH                                 DISCHARGE SUMMARY   HOSPITAL COURSE:  The patient is a 45 year old white female who presented to  the emergency room with right upper quadrant abdominal pain.  Ultrasound of  the gallbladder revealed cholelithiasis and cholecystitis.  She was also  noted to have pancreatitis on laboratory values.  Surgery consultation was  obtained and the patient was admitted to the hospital for further evaluation  and treatment.  She subsequently underwent laparoscopic cholecystectomy with  cholangiograms on March 18, 2005.  She tolerated the procedure well.  Postoperative course has been remarkable only for epigastric pain secondary  to the pancreatitis and her incisions.  Her diet was advanced without  difficulty.   The patient is being discharged home in postoperative day 1 in good and  improving condition.   DISCHARGE INSTRUCTIONS:  The patient is to follow up with Dr. Franky Macho  on March 24, 2005.   DISCHARGE MEDICATIONS:  Tylox 1 tablet p.o. q.4h. p.r.n. pain.   PRINCIPAL DIAGNOSIS:  1.  Cholecystitis, cholelithiasis.  2.  Gallstone pancreatitis.   PRINCIPAL PROCEDURE:  Laparoscopic cholecystectomy with cholangiograms on  March 18, 2005.      MAJ/MEDQ  D:  03/19/2005  T:  03/20/2005  Job:  [id number]   cc:   [name], M.D.  [address], [address]  [place]  [place] [address]  Fax: [phone]   Dalia Heading, M.D.  [address], [place]  [place] [address]  Fax: [phone]

## 2011-05-22 ENCOUNTER — Emergency Department (HOSPITAL_COMMUNITY)
Admission: EM | Admit: 2011-05-22 | Discharge: 2011-05-22 | Discharge status: Against Medical Advice | Payer: Self-pay | Attending: Emergency Medicine | Admitting: Emergency Medicine

## 2011-05-22 DIAGNOSIS — R109 Unspecified abdominal pain: Secondary | ICD-10-CM | POA: Insufficient documentation

## 2011-05-23 ENCOUNTER — Emergency Department (HOSPITAL_COMMUNITY)
Admission: EM | Admit: 2011-05-23 | Discharge: 2011-05-23 | Disposition: A | Discharge status: Home / Self Care | Payer: Self-pay | Attending: Emergency Medicine | Admitting: Emergency Medicine

## 2011-05-23 DIAGNOSIS — Z87898 Personal history of other specified conditions: Secondary | ICD-10-CM | POA: Insufficient documentation

## 2011-05-23 DIAGNOSIS — R1012 Left upper quadrant pain: Secondary | ICD-10-CM | POA: Insufficient documentation

## 2011-05-23 DIAGNOSIS — R112 Nausea with vomiting, unspecified: Secondary | ICD-10-CM | POA: Insufficient documentation

## 2011-05-23 DIAGNOSIS — F17200 Nicotine dependence, unspecified, uncomplicated: Secondary | ICD-10-CM | POA: Insufficient documentation

## 2011-05-23 LAB — COMPREHENSIVE METABOLIC PANEL
ALT: 21 U/L (ref 0–35)
AST: 12 U/L (ref 0–37)
Albumin: 3.4 g/dL — ABNORMAL LOW (ref 3.5–5.2)
Alkaline Phosphatase: 62 U/L (ref 39–117)
Chloride: 104 mEq/L (ref 96–112)
GFR calc Af Amer: >60 mL/min (ref >60)
Potassium: 3.5 mEq/L (ref 3.5–5.1)
Total Bilirubin: 0.1 mg/dL — ABNORMAL LOW (ref 0.3–1.2)

## 2011-05-23 LAB — URINALYSIS, ROUTINE W REFLEX MICROSCOPIC
Bilirubin Urine: NEGATIVE
Ketones, ur: NEGATIVE mg/dL
Nitrite: NEGATIVE
Protein, ur: NEGATIVE mg/dL
Urobilinogen, UA: 0.2 mg/dL (ref 0.0–1.0)
pH: 6.5 (ref 5.0–8.0)

## 2011-05-23 LAB — CBC
HCT: 36.8 % (ref 36.0–46.0)
MCH: 29.3 pg (ref 26.0–34.0)
MCHC: 33.2 g/dL (ref 30.0–36.0)
MCV: 88.5 fL (ref 78.0–100.0)
Platelets: 379 10*3/uL (ref 150–400)
RDW: 14.2 % (ref 11.5–15.5)

## 2011-05-23 LAB — URINE MICROSCOPIC-ADD ON

## 2011-05-23 LAB — DIFFERENTIAL
Eosinophils Absolute: 0.3 10*3/uL (ref 0.0–0.7)
Eosinophils Relative: 5 % (ref 0–5)
Lymphocytes Relative: 39 % (ref 12–46)
Lymphs Abs: 2.9 10*3/uL (ref 0.7–4.0)
Monocytes Absolute: 0.5 10*3/uL (ref 0.1–1.0)
Monocytes Relative: 6 % (ref 3–12)

## 2011-05-31 ENCOUNTER — Emergency Department (HOSPITAL_COMMUNITY)
Admission: EM | Admit: 2011-05-31 | Discharge: 2011-05-31 | Disposition: A | Discharge status: Home / Self Care | Payer: Self-pay | Attending: Emergency Medicine | Admitting: Emergency Medicine

## 2011-05-31 DIAGNOSIS — R0602 Shortness of breath: Secondary | ICD-10-CM | POA: Insufficient documentation

## 2011-05-31 DIAGNOSIS — R109 Unspecified abdominal pain: Secondary | ICD-10-CM | POA: Insufficient documentation

## 2011-05-31 DIAGNOSIS — R112 Nausea with vomiting, unspecified: Secondary | ICD-10-CM | POA: Insufficient documentation

## 2011-05-31 LAB — CBC
Hemoglobin: 13 g/dL (ref 12.0–15.0)
MCH: 29 pg (ref 26.0–34.0)
MCHC: 32.4 g/dL (ref 30.0–36.0)
Platelets: 415 10*3/uL — ABNORMAL HIGH (ref 150–400)

## 2011-05-31 LAB — COMPREHENSIVE METABOLIC PANEL
AST: 13 U/L (ref 0–37)
CO2: 27 mEq/L (ref 19–32)
Calcium: 10.3 mg/dL (ref 8.4–10.5)
Creatinine, Ser: 0.76 mg/dL (ref 0.4–1.2)
GFR calc Af Amer: >60 mL/min (ref >60)
GFR calc non Af Amer: >60 mL/min (ref >60)

## 2011-05-31 LAB — DIFFERENTIAL
Basophils Relative: 0 % (ref 0–1)
Eosinophils Absolute: 0.3 10*3/uL (ref 0.0–0.7)
Eosinophils Relative: 3 % (ref 0–5)
Monocytes Absolute: 0.5 10*3/uL (ref 0.1–1.0)
Monocytes Relative: 6 % (ref 3–12)

## 2011-07-26 ENCOUNTER — Emergency Department (HOSPITAL_COMMUNITY): Payer: Self-pay

## 2011-07-26 ENCOUNTER — Encounter: Payer: Self-pay | Admitting: Emergency Medicine

## 2011-07-26 ENCOUNTER — Emergency Department (HOSPITAL_COMMUNITY)
Admission: EM | Admit: 2011-07-26 | Discharge: 2011-07-26 | Disposition: A | Discharge status: Home / Self Care | Payer: Self-pay | Attending: Emergency Medicine | Admitting: Emergency Medicine

## 2011-07-26 DIAGNOSIS — F17200 Nicotine dependence, unspecified, uncomplicated: Secondary | ICD-10-CM | POA: Insufficient documentation

## 2011-07-26 DIAGNOSIS — W208XXA Other cause of strike by thrown, projected or falling object, initial encounter: Secondary | ICD-10-CM | POA: Insufficient documentation

## 2011-07-26 DIAGNOSIS — T148XXA Other injury of unspecified body region, initial encounter: Secondary | ICD-10-CM

## 2011-07-26 DIAGNOSIS — S9030XA Contusion of unspecified foot, initial encounter: Secondary | ICD-10-CM | POA: Insufficient documentation

## 2011-07-26 MED ORDER — OXYCODONE-ACETAMINOPHEN 5-325 MG PO TABS: 1.0000 | ORAL_TABLET | ORAL | Status: AC | PRN

## 2011-07-26 MED ORDER — OXYCODONE-ACETAMINOPHEN 5-325 MG PO TABS
1.0000 | ORAL_TABLET | Freq: Once | ORAL | Status: AC
  Administered 2011-07-26: 1 via ORAL

## 2011-07-26 NOTE — ED Notes (Signed)
Left in c/o family for transport home; a&ox4; in no distress

## 2011-07-26 NOTE — ED Notes (Signed)
Pt states she dropped a cinderblock on her rt foot about 30pta.

## 2011-07-26 NOTE — ED Notes (Signed)
Ace wrap applied to right foot; crutches with instructions given-demonstrates appropriate ambulation with crutches

## 2011-07-26 NOTE — ED Provider Notes (Signed)
History     Chief Complaint  Patient presents with  . Foot Pain   Patient is a 45 y.o. female presenting with lower extremity pain. The history is provided by the patient.  Foot Pain This is a new problem. The current episode started today. The problem occurs constantly. The problem has been unchanged. Pertinent negatives include no nausea or vomiting. The symptoms are aggravated by standing. She has tried nothing for the symptoms. The treatment provided no relief.  Foot Pain This is a new problem. The current episode started today. The problem occurs constantly. The problem has been unchanged. The symptoms are aggravated by standing. She has tried nothing for the symptoms. The treatment provided no relief.    History reviewed. No pertinent past medical history.  Past Surgical History  Procedure Date  . Cholecystectomy   . Cesarean section     History reviewed. No pertinent family history.  History  Substance Use Topics  . Smoking status: Current Everyday Smoker -- 0.5 packs/day    Types: Cigarettes  . Smokeless tobacco: Not on file  . Alcohol Use: No    OB History    Grav Para Term Preterm Abortions TAB SAB Ect Mult Living                  Review of Systems  Gastrointestinal: Negative for nausea and vomiting.  All other systems reviewed and are negative.    Physical Exam  BP 151/89  Pulse 79  Temp(Src) 98.2 F (36.8 C) (Oral)  Resp 18  Ht 5\' 7"  (1.702 m)  Wt 170 lb (77.111 kg)  BMI 26.63 kg/m2  SpO2 100%  LMP 06/28/2011  Physical Exam  Vitals reviewed. Constitutional: She is oriented to person, place, and time. She appears well-developed and well-nourished.  HENT:  Head: Normocephalic and atraumatic.  Eyes: Conjunctivae are normal.  Neck: Normal range of motion.  Cardiovascular: Normal rate, regular rhythm and intact distal pulses.   Pulmonary/Chest: Effort normal and breath sounds normal.  Abdominal: Soft. Bowel sounds are normal. There is no  tenderness.  Musculoskeletal: Normal range of motion. She exhibits edema and tenderness.       Right foot: She exhibits tenderness, bony tenderness and swelling. She exhibits no crepitus, no deformity and no laceration.       Feet:  Neurological: She is alert and oriented to person, place, and time.  Skin: Skin is warm and dry.  Psychiatric: She has a normal mood and affect.    ED Course  Procedures  MDM Normal xrays.  Patient treated with ice,  Percocet,  Crutches,  Ace wrap.      Candis Musa, PA 07/26/11 1742  Candis Musa, PA 07/26/11 1744  Carleene Cooper III, MD 07/27/11 [phone]

## 2011-07-26 NOTE — ED Notes (Addendum)
Ice pack applied to right foot; pt has foot elevated on pillow; c/o right foot pain-states dropped a cinder block on foot; awaiting assessment by EDPA

## 2011-08-12 ENCOUNTER — Emergency Department (HOSPITAL_COMMUNITY): Payer: Self-pay

## 2011-08-12 ENCOUNTER — Emergency Department (HOSPITAL_COMMUNITY)
Admission: EM | Admit: 2011-08-12 | Discharge: 2011-08-12 | Disposition: A | Discharge status: Home / Self Care | Payer: Self-pay | Attending: Emergency Medicine | Admitting: Emergency Medicine

## 2011-08-12 ENCOUNTER — Encounter (HOSPITAL_COMMUNITY): Payer: Self-pay | Admitting: Emergency Medicine

## 2011-08-12 DIAGNOSIS — F17200 Nicotine dependence, unspecified, uncomplicated: Secondary | ICD-10-CM | POA: Insufficient documentation

## 2011-08-12 DIAGNOSIS — S92253A Displaced fracture of navicular [scaphoid] of unspecified foot, initial encounter for closed fracture: Secondary | ICD-10-CM | POA: Insufficient documentation

## 2011-08-12 DIAGNOSIS — S92909A Unspecified fracture of unspecified foot, initial encounter for closed fracture: Secondary | ICD-10-CM

## 2011-08-12 DIAGNOSIS — IMO0002 Reserved for concepts with insufficient information to code with codable children: Secondary | ICD-10-CM | POA: Insufficient documentation

## 2011-08-12 MED ORDER — OXYCODONE-ACETAMINOPHEN 5-325 MG PO TABS: 2.0000 | ORAL_TABLET | ORAL | Status: DC | PRN

## 2011-08-12 NOTE — ED Notes (Signed)
Patient informed about her Percocet prescription and referral. Pt informed about the dangers of compartment syndrome and how to detect and prevent.

## 2011-08-12 NOTE — ED Notes (Signed)
Patient states that on 07/26/11 she dropped a cinder block on her right foot and that she has since been experiencing increased discomfort with her leg, Patient is experiencing increased swelling towards the end of the day. She states that her leg is "Shiney, it's so swollen" by the end of the day. She also c/o coldness in her foot and decreased ROM. Right foot feels slightly warmer than the left. No swelling seen in the toes or foot at this moment. Pain upon palpation and movement.

## 2011-08-12 NOTE — ED Provider Notes (Addendum)
History   CC: Right foot pain   CSN: [id number] Arrival date & time: 08/12/2011  8:35 AM  Chief Complaint  Patient presents with  . Foot Pain  . Leg Pain   HPI HPI: Cinder block dropped on right foot 2 weeks ago, neg plain films then, continued pain, no swell/red/deformity/weak/numb, pain 24/7 radiates sometimes to lower leg, no associated Sxs, pain severe, worse after walking all day, better if rests and elevates foot.  No other concerns.  History reviewed. No pertinent past medical history.  Past Surgical History  Procedure Date  . Cholecystectomy   . Cesarean section     History reviewed. No pertinent family history.  History  Substance Use Topics  . Smoking status: Current Everyday Smoker -- 0.5 packs/day    Types: Cigarettes  . Smokeless tobacco: Not on file  . Alcohol Use: No    OB History    Grav Para Term Preterm Abortions TAB SAB Ect Mult Living                  Review of Systems  Constitutional: Negative for fever.       10 Systems reviewed and are negative for acute change except as noted in the HPI.  HENT: Negative for congestion.   Eyes: Negative for discharge and redness.  Respiratory: Negative for cough and shortness of breath.   Cardiovascular: Negative for chest pain.  Gastrointestinal: Negative for vomiting and abdominal pain.  Musculoskeletal: Negative for back pain.  Skin: Negative for rash.  Neurological: Negative for syncope, numbness and headaches.  Psychiatric/Behavioral:       No behavior change.    Physical Exam  BP 135/93  Pulse 95  Temp(Src) 98 F (36.7 C) (Oral)  Resp 20  Ht 5\' 7"  (1.702 m)  Wt 170 lb (77.111 kg)  BMI 26.63 kg/m2  SpO2 100%  LMP 06/28/2011  Physical Exam  Nursing note and vitals reviewed. Constitutional:       Awake, alert, nontoxic appearance.  HENT:  Head: Atraumatic.  Eyes: Right eye exhibits no discharge. Left eye exhibits no discharge.  Neck: Neck supple.  Pulmonary/Chest: Effort normal. She  exhibits no tenderness.  Abdominal: Soft. There is no tenderness. There is no rebound.  Musculoskeletal:       Baseline ROM, no obvious new focal weakness.  Arms and left leg NT.  Right leg NT hip/thigh/knee/calf/ankle. Right foot DP intact, CR<2secs, normal LT, FROM, no swel/deformity, color normal, tender midfoot only  Neurological:       Mental status and motor strength appears baseline for patient and situation.  Skin: No rash noted.  Psychiatric: She has a normal mood and affect.    ED Course  Procedures Ct Foot Right Wo Contrast  08/12/2011  *RADIOLOGY REPORT*  Clinical Data: Persistent pain after the patient dropped a cinder block on the dorsum of her foot several weeks ago.  CT OF THE RIGHT FOOT WITHOUT CONTRAST  Technique:  Multidetector CT imaging was performed according to the standard protocol. Multiplanar CT image reconstructions were also generated.  Comparison: Radiographs dated 07/26/2011  Findings: There are hairline fractures of the medial aspect of the navicular adjacent to the os naviculare.  There is suggestion of a hairline fracture of the medial cuneiform but this is not definitive.  Slight lucency in the dorsal aspect of the fourth metatarsal is not felt to represent a fracture.  The other bones appear normal.  IMPRESSION:  1.  Comminuted hairline fractures of the medial aspect of  the navicular. 2.  Possible hairline fracture of the medial cuneiform but this is not definitive.  Original Report Authenticated By: Gwynn Burly, M.D.   Dg Abd Acute W/chest  08/20/2011  *RADIOLOGY REPORT*  Clinical Data: Abdominal pain and swelling.  ACUTE ABDOMEN SERIES (ABDOMEN 2 VIEW & CHEST 1 VIEW)  Comparison: CT scan from 12/10/2010.  Acute abdomen series 12/10/2010  Findings: The lungs are clear without focal infiltrate, edema, pneumothorax or pleural effusion. The cardiopericardial silhouette is within normal limits for size. Imaged bony structures of the thorax are intact.   Upright film shows no evidence for intraperitoneal free air.  No gaseous bowel dilatation to suggest obstruction.  Surgical clips in the right upper quadrant suggest prior cholecystectomy.  IMPRESSION: The no acute cardiopulmonary process.  No evidence for bowel perforation or obstruction.  Original Report Authenticated By: ERIC A. MANSELL, M.D.  MDM Patient / Family / Caregiver informed of clinical course, understand medical decision-making process, and agree with plan.      Hurman Horn, MD 08/26/11 2159  Hurman Horn, MD 08/26/11 2159

## 2011-08-12 NOTE — ED Notes (Signed)
Hyperspace incorrect, L Rita [name] is Charity fundraiser, not NT.

## 2011-08-12 NOTE — ED Notes (Signed)
Pt has prior old injury to the left foot and still c/o pain to the left foot and left leg

## 2011-08-20 ENCOUNTER — Emergency Department (HOSPITAL_COMMUNITY)
Admission: EM | Admit: 2011-08-20 | Discharge: 2011-08-20 | Disposition: A | Discharge status: Home / Self Care | Payer: Self-pay | Attending: Emergency Medicine | Admitting: Emergency Medicine

## 2011-08-20 ENCOUNTER — Emergency Department (HOSPITAL_COMMUNITY): Payer: Self-pay

## 2011-08-20 ENCOUNTER — Encounter (HOSPITAL_COMMUNITY): Payer: Self-pay | Admitting: Emergency Medicine

## 2011-08-20 DIAGNOSIS — K859 Acute pancreatitis without necrosis or infection, unspecified: Secondary | ICD-10-CM

## 2011-08-20 DIAGNOSIS — R1013 Epigastric pain: Secondary | ICD-10-CM | POA: Insufficient documentation

## 2011-08-20 DIAGNOSIS — Z7982 Long term (current) use of aspirin: Secondary | ICD-10-CM | POA: Insufficient documentation

## 2011-08-20 DIAGNOSIS — F17200 Nicotine dependence, unspecified, uncomplicated: Secondary | ICD-10-CM | POA: Insufficient documentation

## 2011-08-20 DIAGNOSIS — R748 Abnormal levels of other serum enzymes: Secondary | ICD-10-CM | POA: Diagnosis present

## 2011-08-20 HISTORY — DX: Gastro-esophageal reflux disease without esophagitis: K21.9

## 2011-08-20 HISTORY — DX: Peptic ulcer, site unspecified, unspecified as acute or chronic, without hemorrhage or perforation: K27.9

## 2011-08-20 LAB — COMPREHENSIVE METABOLIC PANEL
Alkaline Phosphatase: 55 U/L (ref 39–117)
BUN: 9 mg/dL (ref 6–23)
CO2: 26 mEq/L (ref 19–32)
Chloride: 103 mEq/L (ref 96–112)
GFR calc Af Amer: >60 mL/min (ref >60)
Glucose, Bld: 88 mg/dL (ref 70–99)
Potassium: 3.9 mEq/L (ref 3.5–5.1)
Total Bilirubin: 0.1 mg/dL — ABNORMAL LOW (ref 0.3–1.2)

## 2011-08-20 LAB — DIFFERENTIAL
Eosinophils Absolute: 0.5 10*3/uL (ref 0.0–0.7)
Lymphocytes Relative: 37 % (ref 12–46)
Lymphs Abs: 2.7 10*3/uL (ref 0.7–4.0)
Monocytes Relative: 6 % (ref 3–12)
Neutro Abs: 3.7 10*3/uL (ref 1.7–7.7)
Neutrophils Relative %: 50 % (ref 43–77)

## 2011-08-20 LAB — CBC
Hemoglobin: 12.6 g/dL (ref 12.0–15.0)
MCH: 30.1 pg (ref 26.0–34.0)
RBC: 4.18 MIL/uL (ref 3.87–5.11)

## 2011-08-20 LAB — LIPASE, BLOOD: Lipase: 242 U/L — ABNORMAL HIGH (ref 11–59)

## 2011-08-20 MED ORDER — HYDROMORPHONE HCL 1 MG/ML IJ SOLN
1.0000 mg | Freq: Once | INTRAMUSCULAR | Status: AC
  Administered 2011-08-20: 1 mg via INTRAVENOUS
  Filled 2011-08-20: qty 1

## 2011-08-20 MED ORDER — OXYCODONE-ACETAMINOPHEN 5-325 MG PO TABS: 1.0000 | ORAL_TABLET | Freq: Four times a day (QID) | ORAL | Status: AC | PRN

## 2011-08-20 MED ORDER — ONDANSETRON HCL 4 MG/2ML IJ SOLN
4.0000 mg | Freq: Once | INTRAMUSCULAR | Status: AC
  Administered 2011-08-20: 4 mg via INTRAVENOUS
  Filled 2011-08-20: qty 2

## 2011-08-20 MED ORDER — SODIUM CHLORIDE 0.9 % IV SOLN
Freq: Once | INTRAVENOUS | Status: AC
  Administered 2011-08-20: 12:00:00 via INTRAVENOUS

## 2011-08-20 NOTE — ED Notes (Signed)
Rectal exam negative for blood per Dr. Bernette Mayers

## 2011-08-20 NOTE — ED Notes (Signed)
Patient c/o abd pain x3 days. Per pateint hx of pancreatitis. Patient states "It feels like my pancreas. I have been doing the clear liquid diet and relaxing at home to try to help but nothings helping." Patient reports nausea, vomiting, diarrhea (bright red rectal bleeding with stools).

## 2011-08-20 NOTE — ED Notes (Signed)
C/o n/v/d for 3 days, rectal bleeding with bowel movements bright red in color, pt states that sometimes the bleeding is just a little bit but this am it was worse. Epigastric pain that radiates to left side, same pain as in previous flare ups with pancreatitis.

## 2011-08-20 NOTE — ED Notes (Signed)
Pt states that the pain is better but is requesting pain medication prior to going home,

## 2011-08-20 NOTE — ED Notes (Signed)
Pt also c/o pain to left hip area from fall yesterday, pt has splint in place to lower right extremity due to recent fx.

## 2011-08-20 NOTE — ED Provider Notes (Signed)
Scribed for [employer]. Bernette Mayers, MD, the patient was seen in room APA11/APA11. This chart was scribed by [employer]. The patient's care started at 11:10  CSN: [id number] Arrival date & time: 08/20/2011 10:43 AM  Chief Complaint  Patient presents with  . Abdominal Pain    n/v/d   HPI Rita [name] is a 45 y.o. female with a history of pancreatis, who presents to the Emergency Department complaining of epigastric abdominal pain, onset 3 days ago. Patient states that "it feels like my ribs are broken". Associated symptoms include nausea, vomiting, diarrhea with bright red rectal bleeding with stools. Pain is aggravated by coughing and not alleviated by pain medicine she takes for her broken leg. There are no other associated symptoms and no other alleviating or aggravating factors.    HPI ELEMENTS:  Location: Abdomen  Duration: 3 days  Timing: constant Modifying factors: aggravated by coughing Context:  as above  Associated symptoms: as above    Past Medical History  Diagnosis Date  . Pancreatitis   . Peptic ulcer   . GERD (gastroesophageal reflux disease)    MEDICATIONS:  Previous Medications   ARTIFICIAL TEAR OINTMENT (DRY EYES OP)    Apply 1 drop to eye daily as needed. For dry eye relief    ASPIRIN (BAYER ASPIRIN) 325 MG TABLET    Take 325 mg by mouth daily.     OMEPRAZOLE (PRILOSEC) 20 MG CAPSULE    Take 20 mg by mouth 2 (two) times daily.     OXYCODONE-ACETAMINOPHEN (PERCOCET) 5-325 MG PER TABLET    Take 2 tablets by mouth every 4 (four) hours as needed for pain.   SALINE NASAL MIST NA    Place 1 spray into the nose daily as needed. For congestion      ALLERGIES:  Allergies as of 08/20/2011 - Review Complete 08/20/2011  Allergen Reaction Noted  . Ibuprofen Other (See Comments) 08/12/2011  . Vicodin (hydrocodone-acetaminophen) Other (See Comments) 07/26/2011      Past Surgical History  Procedure Date  . Cholecystectomy   . Cesarean section   . Tubial ligation      Family History  Problem Relation Age of Onset  . Asthma Mother   . Heart failure Mother   . Cancer Mother   . Diabetes Mother   . Hypertension Mother   . Stroke Mother   . Heart failure Father   . Diabetes Father     History  Substance Use Topics  . Smoking status: Current Everyday Smoker -- 0.5 packs/day for 20 years    Types: Cigarettes  . Smokeless tobacco: Never Used  . Alcohol Use: No    OB History    Grav Para Term Preterm Abortions TAB SAB Ect Mult Living   4 3 3  1  1   3       Review of Systems  Physical Exam  BP 120/91  Pulse 98  Temp(Src) 97.6 F (36.4 C) (Oral)  Resp 20  Ht 5\' 7"  (1.702 m)  Wt 170 lb (77.111 kg)  BMI 26.63 kg/m2  SpO2 100%  LMP 08/17/2011  Physical Exam  Constitutional: She is oriented to person, place, and time. She appears well-developed and well-nourished. No distress.  HENT:  Head: Normocephalic and atraumatic.  Mouth/Throat: No oropharyngeal exudate.  Eyes: Conjunctivae and EOM are normal. Pupils are equal, round, and reactive to light.  Neck: Normal range of motion. Neck supple. No tracheal deviation present.  Cardiovascular: Normal rate, regular rhythm and normal heart  sounds.   No murmur heard. Pulmonary/Chest: Effort normal and breath sounds normal. No respiratory distress. She has no wheezes.  Abdominal: Bowel sounds are normal. She exhibits distension. There is tenderness (epigastric). There is no guarding.       No peritoneal signs  Musculoskeletal: She exhibits no edema and no tenderness.  Neurological: She is alert and oriented to person, place, and time. No cranial nerve deficit.  Skin: Skin is warm and dry. No rash noted. No erythema.  Psychiatric: She has a normal mood and affect. Her behavior is normal.    ED Course  Procedures  OTHER DATA REVIEWED: Nursing notes, vital signs, and past medical records reviewed.   Results for orders placed during the hospital encounter of 08/20/11  CBC      Component  Value Range   WBC 7.4  4.0 - 10.5 (K/uL)   RBC 4.18  3.87 - 5.11 (MIL/uL)   Hemoglobin 12.6  12.0 - 15.0 (g/dL)   HCT 16.1  09.6 - 04.5 (%)   MCV 88.0  78.0 - 100.0 (fL)   MCH 30.1  26.0 - 34.0 (pg)   MCHC 34.2  30.0 - 36.0 (g/dL)   RDW 40.9  81.1 - 91.4 (%)   Platelets 321  150 - 400 (K/uL)  DIFFERENTIAL      Component Value Range   Neutrophils Relative 50  43 - 77 (%)   Neutro Abs 3.7  1.7 - 7.7 (K/uL)   Lymphocytes Relative 37  12 - 46 (%)   Lymphs Abs 2.7  0.7 - 4.0 (K/uL)   Monocytes Relative 6  3 - 12 (%)   Monocytes Absolute 0.5  0.1 - 1.0 (K/uL)   Eosinophils Relative 6 (*) 0 - 5 (%)   Eosinophils Absolute 0.5  0.0 - 0.7 (K/uL)   Basophils Relative 0  0 - 1 (%)   Basophils Absolute 0.0  0.0 - 0.1 (K/uL)  COMPREHENSIVE METABOLIC PANEL      Component Value Range   Sodium 138  135 - 145 (mEq/L)   Potassium 3.9  3.5 - 5.1 (mEq/L)   Chloride 103  96 - 112 (mEq/L)   CO2 26  19 - 32 (mEq/L)   Glucose, Bld 88  70 - 99 (mg/dL)   BUN 9  6 - 23 (mg/dL)   Creatinine, Ser 7.82  0.50 - 1.10 (mg/dL)   Calcium 9.6  8.4 - 95.6 (mg/dL)   Total Protein 7.2  6.0 - 8.3 (g/dL)   Albumin 3.6  3.5 - 5.2 (g/dL)   AST 14  0 - 37 (U/L)   ALT 27  0 - 35 (U/L)   Alkaline Phosphatase 55  39 - 117 (U/L)   Total Bilirubin 0.1 (*) 0.3 - 1.2 (mg/dL)   GFR calc non Af Amer >60  >60 (mL/min)   GFR calc Af Amer >60  >60 (mL/min)  LIPASE, BLOOD      Component Value Range   Lipase 242 (*) 11 - 59 (U/L)     DIAGNOSTIC STUDIES: Oxygen Saturation is 100% on room air, normal by my interpretation.      ED COURSE / COORDINATION OF CARE: 11:15 - EDMD examined patient and performed hemoccult test. Hemoccult negative. EDMD ordered a DG Abd Acute W/Chest, CBC, Differential, CMP, Lipase, blood UA  MDM: Abdominal pain, consistent with prior pancreatitis. Not improved with clear fluids, rest and pain meds at home. Labs show mild elevation in her lipase. Offered admission to the patient, but she would  like  to continue management at home.    Scribe Attestation I personally performed the services described in the documentation, which were scribed in my presence. The recorded information has been reviewed and considered.   SHELDON,CHARLES B.         [name], MD 08/20/11 1300

## 2011-08-22 LAB — OCCULT BLOOD, POC DEVICE: Fecal Occult Bld: NEGATIVE

## 2011-08-28 ENCOUNTER — Emergency Department (HOSPITAL_COMMUNITY): Payer: Self-pay

## 2011-08-28 ENCOUNTER — Emergency Department (HOSPITAL_COMMUNITY)
Admission: EM | Admit: 2011-08-28 | Discharge: 2011-08-29 | Disposition: A | Discharge status: Home / Self Care | Payer: Self-pay | Attending: Emergency Medicine | Admitting: Emergency Medicine

## 2011-08-28 ENCOUNTER — Encounter (HOSPITAL_COMMUNITY): Payer: Self-pay | Admitting: *Deleted

## 2011-08-28 DIAGNOSIS — K279 Peptic ulcer, site unspecified, unspecified as acute or chronic, without hemorrhage or perforation: Secondary | ICD-10-CM | POA: Insufficient documentation

## 2011-08-28 DIAGNOSIS — F17200 Nicotine dependence, unspecified, uncomplicated: Secondary | ICD-10-CM | POA: Insufficient documentation

## 2011-08-28 DIAGNOSIS — K859 Acute pancreatitis without necrosis or infection, unspecified: Secondary | ICD-10-CM | POA: Insufficient documentation

## 2011-08-28 LAB — COMPREHENSIVE METABOLIC PANEL
Alkaline Phosphatase: 57 U/L (ref 39–117)
BUN: 10 mg/dL (ref 6–23)
CO2: 28 mEq/L (ref 19–32)
Chloride: 102 mEq/L (ref 96–112)
GFR calc Af Amer: >60 mL/min (ref >60)
Glucose, Bld: 103 mg/dL — ABNORMAL HIGH (ref 70–99)
Potassium: 3.4 mEq/L — ABNORMAL LOW (ref 3.5–5.1)
Total Bilirubin: 0.2 mg/dL — ABNORMAL LOW (ref 0.3–1.2)

## 2011-08-28 LAB — CBC
Hemoglobin: 12.8 g/dL (ref 12.0–15.0)
MCH: 29.7 pg (ref 26.0–34.0)
RBC: 4.31 MIL/uL (ref 3.87–5.11)

## 2011-08-28 LAB — DIFFERENTIAL
Lymphs Abs: 3 10*3/uL (ref 0.7–4.0)
Monocytes Relative: 5 % (ref 3–12)
Neutro Abs: 4.9 10*3/uL (ref 1.7–7.7)
Neutrophils Relative %: 55 % (ref 43–77)

## 2011-08-28 LAB — LIPASE, BLOOD: Lipase: 80 U/L — ABNORMAL HIGH (ref 11–59)

## 2011-08-28 MED ORDER — MORPHINE SULFATE 4 MG/ML IJ SOLN
4.0000 mg | Freq: Once | INTRAMUSCULAR | Status: AC
  Administered 2011-08-28: 4 mg via INTRAVENOUS
  Filled 2011-08-28: qty 1

## 2011-08-28 MED ORDER — ONDANSETRON HCL 4 MG/2ML IJ SOLN
4.0000 mg | Freq: Once | INTRAMUSCULAR | Status: AC
  Administered 2011-08-28: 4 mg via INTRAVENOUS
  Filled 2011-08-28: qty 2

## 2011-08-28 MED ORDER — IOHEXOL 300 MG/ML  SOLN
100.0000 mL | Freq: Once | INTRAMUSCULAR | Status: AC | PRN
  Administered 2011-08-28: 100 mL via INTRAVENOUS

## 2011-08-28 MED ORDER — SODIUM CHLORIDE 0.9 % IV SOLN
999.0000 mL | INTRAVENOUS | Status: DC
  Administered 2011-08-28: 1000 mL via INTRAVENOUS

## 2011-08-28 MED ORDER — HYDROMORPHONE HCL 1 MG/ML IJ SOLN
1.0000 mg | Freq: Once | INTRAMUSCULAR | Status: AC
  Administered 2011-08-28: 1 mg via INTRAVENOUS
  Filled 2011-08-28: qty 1

## 2011-08-28 MED ORDER — HYDROMORPHONE HCL 2 MG PO TABS: 2.0000 mg | ORAL_TABLET | ORAL | Status: AC | PRN

## 2011-08-28 MED ORDER — PROMETHAZINE HCL 25 MG PO TABS: 25.0000 mg | ORAL_TABLET | Freq: Four times a day (QID) | ORAL | Status: DC | PRN

## 2011-08-28 NOTE — ED Provider Notes (Addendum)
History   Scribed for Nicholes Stairs, MD, the patient was seen in room APA12/APA12. This chart was scribed by Clarita Crane. This patient's care was started at 8:08PM.   CSN: [id number] Arrival date & time: 08/28/2011  7:00 PM  Chief Complaint  Patient presents with  . Abdominal Pain   The history is provided by the patient.   Rita [name] is a 45 y.o. female who presents to the Emergency Department complaining of constant moderate to severe left upper quadrant abdominal pain onset this afternoon after eating a sandwich and persistent since with associated mildly productive cough. Patient also notes having an episode of hematemesis with bright red blood several minutes after onset of abdominal pain. States abdominal pain is aggravated with coughing and relived by nothing. Denies fever, chills, hematuria, dysuria, dizziness. Patient reports she has had her esophagus stretched several times by Dr. Karilyn Cota which has caused similar episodes of hematemesis as tonight. Patient with h/o pancreatitis, peptic ulcer, GERD, cholecystectomy, C-section. Patient is a current everyday smoker.  Gastroenterologist- Dr. Karilyn Cota  PAST MEDICAL HISTORY:  Past Medical History  Diagnosis Date  . Pancreatitis   . Peptic ulcer   . GERD (gastroesophageal reflux disease)     PAST SURGICAL HISTORY:  Past Surgical History  Procedure Date  . Cholecystectomy   . Cesarean section   . Tubial ligation     MEDICATIONS:  Previous Medications   ARTIFICIAL TEAR OINTMENT (DRY EYES OP)    Apply 1 drop to eye daily as needed. For dry eye relief    ASPIRIN (BAYER ASPIRIN) 325 MG TABLET    Take 325 mg by mouth daily.     OMEPRAZOLE (PRILOSEC) 20 MG CAPSULE    Take 20 mg by mouth 2 (two) times daily.     OXYCODONE-ACETAMINOPHEN (PERCOCET) 5-325 MG PER TABLET    Take 1-2 tablets by mouth every 6 (six) hours as needed for pain.   SALINE NASAL MIST NA    Place 1 spray into the nose daily as needed. For congestion       ALLERGIES:  Allergies as of 08/28/2011 - Review Complete 08/28/2011  Allergen Reaction Noted  . Ibuprofen Other (See Comments) 08/12/2011  . Vicodin (hydrocodone-acetaminophen) Other (See Comments) 07/26/2011     FAMILY HISTORY:  Family History  Problem Relation Age of Onset  . Asthma Mother   . Heart failure Mother   . Cancer Mother   . Diabetes Mother   . Hypertension Mother   . Stroke Mother   . Heart failure Father   . Diabetes Father      SOCIAL HISTORY: History   Social History  . Marital Status: Divorced    Spouse Name: N/A    Number of Children: N/A  . Years of Education: N/A   Social History Main Topics  . Smoking status: Current Everyday Smoker -- 0.5 packs/day for 20 years    Types: Cigarettes  . Smokeless tobacco: Never Used  . Alcohol Use: No  . Drug Use: No  . Sexually Active: Yes    Birth Control/ Protection: Surgical   Other Topics Concern  . None   Social History Narrative  . None    OB History    Grav Para Term Preterm Abortions TAB SAB Ect Mult Living   4 3 3  1  1   3       Review of Systems 10 Systems reviewed and are negative for acute change except as noted in the HPI.  Physical  Exam  BP 115/76  Pulse 67  Temp(Src) 98.2 F (36.8 C) (Oral)  Resp 18  Ht 5\' 7"  (1.702 m)  Wt 170 lb (77.111 kg)  BMI 26.63 kg/m2  SpO2 98%  LMP 08/17/2011  Physical Exam  Nursing note and vitals reviewed. Constitutional: She is oriented to person, place, and time. She appears well-developed and well-nourished.       Uncomfortable appearing.   HENT:  Head: Normocephalic and atraumatic.  Mouth/Throat: Oropharynx is clear and moist.  Eyes: Conjunctivae and EOM are normal.  Neck: Neck supple.  Cardiovascular: Normal rate, regular rhythm and intact distal pulses.  Exam reveals no gallop and no friction rub.   No murmur heard. Pulmonary/Chest: Effort normal and breath sounds normal. She has no wheezes. She has no rales.  Abdominal: Soft. Bowel  sounds are normal. She exhibits no distension. There is no tenderness. There is no rebound and no guarding.       Mild LLQ and RLQ tenderness. Moderate RUQ and LLQ tenderness.   Musculoskeletal: Normal range of motion. She exhibits no edema.  Neurological: She is alert and oriented to person, place, and time. No sensory deficit.  Skin: Skin is warm and dry.  Psychiatric: She has a normal mood and affect. Her behavior is normal.    ED Course  Procedures  OTHER DATA REVIEWED: Nursing notes, vital signs, and past medical records reviewed. Lab results reviewed and considered Imaging results reviewed and considered  DIAGNOSTIC STUDIES: Oxygen Saturation is 100% on room air, normal by my interpretation.    LABS / RADIOLOGY: Results for orders placed during the hospital encounter of 08/28/11  CBC      Component Value Range   WBC 8.9  4.0 - 10.5 (K/uL)   RBC 4.31  3.87 - 5.11 (MIL/uL)   Hemoglobin 12.8  12.0 - 15.0 (g/dL)   HCT 40.9  81.1 - 91.4 (%)   MCV 88.6  78.0 - 100.0 (fL)   MCH 29.7  26.0 - 34.0 (pg)   MCHC 33.5  30.0 - 36.0 (g/dL)   RDW 78.2  95.6 - 21.3 (%)   Platelets 400  150 - 400 (K/uL)  DIFFERENTIAL      Component Value Range   Neutrophils Relative 55  43 - 77 (%)   Neutro Abs 4.9  1.7 - 7.7 (K/uL)   Lymphocytes Relative 34  12 - 46 (%)   Lymphs Abs 3.0  0.7 - 4.0 (K/uL)   Monocytes Relative 5  3 - 12 (%)   Monocytes Absolute 0.4  0.1 - 1.0 (K/uL)   Eosinophils Relative 6 (*) 0 - 5 (%)   Eosinophils Absolute 0.5  0.0 - 0.7 (K/uL)   Basophils Relative 0  0 - 1 (%)   Basophils Absolute 0.0  0.0 - 0.1 (K/uL)  COMPREHENSIVE METABOLIC PANEL      Component Value Range   Sodium 138  135 - 145 (mEq/L)   Potassium 3.4 (*) 3.5 - 5.1 (mEq/L)   Chloride 102  96 - 112 (mEq/L)   CO2 28  19 - 32 (mEq/L)   Glucose, Bld 103 (*) 70 - 99 (mg/dL)   BUN 10  6 - 23 (mg/dL)   Creatinine, Ser 0.86  0.50 - 1.10 (mg/dL)   Calcium 9.3  8.4 - 57.8 (mg/dL)   Total Protein 7.2  6.0 -  8.3 (g/dL)   Albumin 3.9  3.5 - 5.2 (g/dL)   AST 13  0 - 37 (U/L)   ALT 15  0 - 35 (U/L)   Alkaline Phosphatase 57  39 - 117 (U/L)   Total Bilirubin 0.2 (*) 0.3 - 1.2 (mg/dL)   GFR calc non Af Amer >60  >60 (mL/min)   GFR calc Af Amer >60  >60 (mL/min)  LIPASE, BLOOD      Component Value Range   Lipase 80 (*) 11 - 59 (U/L)   Ct Abdomen Pelvis W Contrast  08/28/2011  *RADIOLOGY REPORT*  Clinical Data: Abdominal pain.  Distended abdomen.  History of pancreatitis, GERD, cholecystectomy, C-section.  CT ABDOMEN AND PELVIS WITH CONTRAST  Technique:  Multidetector CT imaging of the abdomen and pelvis was performed following the standard protocol during bolus administration of intravenous contrast.  Contrast: 100 ml Omnipaque-300  Comparison: 12/10/2010  Findings: There is mild dependent change at the lung bases.  The patient has a hiatal hernia.  Heart size is normal.  There are bilateral renal cysts.  No hydronephrosis.  The gallbladder is surgically absent.  There is minimal stranding within the region of the duodenal bulb at the level of the pancreatic head.  The findings may be related to duodenitis, ulcer disease, or less likely, pancreatitis. Otherwise, small and large bowel loops have a normal appearance. The appendix is well seen and has a normal appearance.  The uterus is present.  No adnexal mass or free pelvic fluid identified. No retroperitoneal or mesenteric adenopathy. No evidence for aortic aneurysm. Visualized osseous structures have a normal appearance.  IMPRESSION:  1.  Mild stranding in the region of the duodenal bulb, suggesting duodenitis or peptic ulcer disease.  Differential diagnosis also includes mild pancreatitis. 2.  No evidence for acute appendicitis, bowel obstruction, or abscess.  No evidence for pseudocyst.  Original Report Authenticated By: Patterson Hammersmith, M.D.   Dg Abd Acute W/chest  08/28/2011  *RADIOLOGY REPORT*  Clinical Data: Acute onset of epigastric pain.   Hemoptysis. History of peptic ulcer disease, abdominal surgery.  Rule out perforation or small bowel obstruction.  Nausea, vomiting.  ACUTE ABDOMEN SERIES (ABDOMEN 2 VIEW & CHEST 1 VIEW)  Comparison: 08/20/2011  Findings: The heart is mildly enlarged.  No evidence for pulmonary edema.  There are no focal consolidations or pleural effusions.  No free intraperitoneal air beneath the diaphragm.  Supine and erect views of the abdomen show mildly dilated small bowel loops centrally, a new finding since prior study. Air fluid levels are seen on the erect study.  There is stool within the colon.  IMPRESSION:  1.  No evidence for acute cardiopulmonary disease. 2.  Dilated small bowel loops consistent with early or partial small bowel obstruction.  Original Report Authenticated By: Patterson Hammersmith, M.D.    PROCEDURES:  ED COURSE / COORDINATION OF CARE: Orders Placed This Encounter  Procedures  . DG Abd Acute W/Chest  . CT Abdomen Pelvis W Contrast  . CBC  . Differential  . Comprehensive metabolic panel  . Lipase, blood     MDM: Differential Diagnosis: pud vs mild pancreatitis.  Pain improved.  Not hypotensive. Pt does not want to stay in hospital.  She has gi md with whom she can follow up   PLAN: analgesics, antiemetics, continue protonix. Follow up with her gi md. The patient is to return the emergency department if there is any worsening of symptoms. I have reviewed the discharge instructions with the patient/family  CONDITION ON DISCHARGE: stable   MEDICATIONS GIVEN IN THE E.D.  Medications  0.9 %  sodium chloride infusion (1000 mL  Intravenous New Bag 08/28/11 2048)  morphine injection 4 mg (4 mg Intravenous Given 08/28/11 2047)  ondansetron (ZOFRAN) injection 4 mg (4 mg Intravenous Given 08/28/11 2047)  HYDROmorphone (DILAUDID) injection 1 mg (1 mg Intravenous Given 08/28/11 2142)  iohexol (OMNIPAQUE) 300 MG/ML injection 100 mL (100 mL Intravenous Contrast Given 08/28/11 2209)      I  personally performed the services described in this documentation, which was scribed in my presence. The recorded information has been reviewed and considered. Nicholes Stairs, MD    Nicholes Stairs, MD 08/28/11 [id number]  Nicholes Stairs, MD 08/29/11 0001

## 2011-08-28 NOTE — ED Notes (Signed)
Pt reports some relief of abdominal pain.  tolerating p.o. Intake with no vomiting at this time.

## 2011-08-28 NOTE — ED Notes (Signed)
Pt c/o upper abdominal pain and swelling x 2 days. States that she got a french fry stuck in her throat approximately 1 1/2 hours ago. Pt states that she was sticking her finger down her throat and then she started bleeding from her mouth and her right nostril. No active bleeding at present. Pt states that she thinks the french fry is still in her throat but is not sure.

## 2011-08-29 MED ORDER — ONDANSETRON HCL 4 MG/2ML IJ SOLN
4.0000 mg | Freq: Once | INTRAMUSCULAR | Status: AC
  Administered 2011-08-29: 4 mg via INTRAVENOUS
  Filled 2011-08-29: qty 2

## 2011-08-29 NOTE — ED Notes (Signed)
Pt reports improvement in nausea.  Expressing desire for discharge now.

## 2011-08-29 NOTE — ED Notes (Signed)
Pt reporting nausea and recent emesis.  Notified EDP.

## 2011-09-17 ENCOUNTER — Emergency Department (HOSPITAL_COMMUNITY)
Admission: EM | Admit: 2011-09-17 | Discharge: 2011-09-17 | Disposition: A | Discharge status: Home / Self Care | Payer: Self-pay | Attending: Emergency Medicine | Admitting: Emergency Medicine

## 2011-09-17 ENCOUNTER — Emergency Department (HOSPITAL_COMMUNITY): Payer: Self-pay

## 2011-09-17 ENCOUNTER — Encounter (HOSPITAL_COMMUNITY): Payer: Self-pay | Admitting: *Deleted

## 2011-09-17 DIAGNOSIS — R112 Nausea with vomiting, unspecified: Secondary | ICD-10-CM

## 2011-09-17 DIAGNOSIS — G8929 Other chronic pain: Secondary | ICD-10-CM

## 2011-09-17 DIAGNOSIS — K920 Hematemesis: Secondary | ICD-10-CM | POA: Insufficient documentation

## 2011-09-17 DIAGNOSIS — Z7982 Long term (current) use of aspirin: Secondary | ICD-10-CM | POA: Insufficient documentation

## 2011-09-17 HISTORY — DX: Diaphragmatic hernia without obstruction or gangrene: K44.9

## 2011-09-17 LAB — URINALYSIS, ROUTINE W REFLEX MICROSCOPIC
Ketones, ur: NEGATIVE mg/dL
Nitrite: NEGATIVE
Specific Gravity, Urine: 1.01 (ref 1.005–1.030)
Urobilinogen, UA: 0.2 mg/dL (ref 0.0–1.0)
pH: 6.5 (ref 5.0–8.0)

## 2011-09-17 LAB — DIFFERENTIAL
Eosinophils Absolute: 0.4 10*3/uL (ref 0.0–0.7)
Eosinophils Relative: 6 % — ABNORMAL HIGH (ref 0–5)
Lymphocytes Relative: 34 % (ref 12–46)
Lymphs Abs: 2.2 10*3/uL (ref 0.7–4.0)
Monocytes Absolute: 0.6 10*3/uL (ref 0.1–1.0)

## 2011-09-17 LAB — POCT PREGNANCY, URINE: Preg Test, Ur: NEGATIVE

## 2011-09-17 LAB — COMPREHENSIVE METABOLIC PANEL
CO2: 28 mEq/L (ref 19–32)
Calcium: 9.1 mg/dL (ref 8.4–10.5)
Creatinine, Ser: 0.7 mg/dL (ref 0.50–1.10)
GFR calc Af Amer: >60 mL/min (ref >60)
GFR calc non Af Amer: >60 mL/min (ref >60)
Glucose, Bld: 93 mg/dL (ref 70–99)

## 2011-09-17 LAB — URINE MICROSCOPIC-ADD ON

## 2011-09-17 LAB — CBC
HCT: 37.2 % (ref 36.0–46.0)
MCH: 30 pg (ref 26.0–34.0)
MCV: 89.4 fL (ref 78.0–100.0)
RBC: 4.16 MIL/uL (ref 3.87–5.11)
RDW: 14.1 % (ref 11.5–15.5)
WBC: 6.6 10*3/uL (ref 4.0–10.5)

## 2011-09-17 MED ORDER — POTASSIUM CHLORIDE 20 MEQ PO PACK: 40.0000 meq | PACK | Freq: Once | ORAL | Status: AC

## 2011-09-17 MED ORDER — POTASSIUM CHLORIDE CRYS ER 20 MEQ PO TBCR
EXTENDED_RELEASE_TABLET | ORAL | Status: AC
  Administered 2011-09-17: 40 meq
  Filled 2011-09-17: qty 2

## 2011-09-17 MED ORDER — ONDANSETRON HCL 4 MG/2ML IJ SOLN
4.0000 mg | INTRAMUSCULAR | Status: DC | PRN
  Administered 2011-09-17: 4 mg via INTRAVENOUS
  Filled 2011-09-17: qty 2

## 2011-09-17 MED ORDER — ONDANSETRON HCL 4 MG PO TABS: 4.0000 mg | ORAL_TABLET | Freq: Four times a day (QID) | ORAL | Status: AC | PRN

## 2011-09-17 MED ORDER — PANTOPRAZOLE SODIUM 40 MG IV SOLR
40.0000 mg | Freq: Once | INTRAVENOUS | Status: AC
  Administered 2011-09-17: 40 mg via INTRAVENOUS
  Filled 2011-09-17: qty 40

## 2011-09-17 MED ORDER — FENTANYL CITRATE 0.05 MG/ML IJ SOLN
50.0000 ug | INTRAMUSCULAR | Status: AC | PRN
  Administered 2011-09-17 (×2): 50 ug via INTRAVENOUS
  Filled 2011-09-17 (×2): qty 2

## 2011-09-17 MED ORDER — SODIUM CHLORIDE 0.9 % IV SOLN
INTRAVENOUS | Status: DC
  Administered 2011-09-17: 08:00:00 via INTRAVENOUS

## 2011-09-17 MED ORDER — FAMOTIDINE IN NACL 20-0.9 MG/50ML-% IV SOLN
20.0000 mg | Freq: Once | INTRAVENOUS | Status: AC
  Administered 2011-09-17: 20 mg via INTRAVENOUS
  Filled 2011-09-17: qty 50

## 2011-09-17 NOTE — ED Notes (Signed)
Pt c/o blood coming from mouth and right sided abd pain;

## 2011-09-17 NOTE — ED Provider Notes (Signed)
History  Scribed for Dr. Clarene Duke, the patient was seen in room 15. The chart was scribed by Gilman Schmidt. The patients care was started at 0712.  CSN: [id number] Arrival date & time: 09/17/2011  6:52 AM  Chief Complaint  Patient presents with  . Hematemesis  . Abdominal Pain    right    HPI    HPI Pt was seen at 0710.  Rita [name] is a 45 y.o. female with a history of Pancreatitis and GERD who presents to the Emergency Department complaining of gradual onset and persistence of constant generalized abd "pain" since this morning.  States she woke up with "blood running out of my mouth" and epistaxis, followed by "vomiting blood."  Last bowel movement was thing morning (runny). States she washed her nose out with water and "nasal spray."  Endorses she has also run out of her chronic pain meds (percocet).  Denies black or blood in stools, no CP/SOB, no back pain, no fevers, no rash.     No PCP; GI MD:  Dr. Karilyn Cota  PAST MEDICAL HISTORY:  Past Medical History  Diagnosis Date  . Pancreatitis   . Peptic ulcer   . GERD (gastroesophageal reflux disease)   . Hiatal hernia   . Chronic back pain      PAST SURGICAL HISTORY:  Past Surgical History  Procedure Date  . Cholecystectomy   . Cesarean section   . Tubial ligation      MEDICATIONS:  Previous Medications   ARTIFICIAL TEAR OINTMENT (DRY EYES OP)    Apply 1 drop to eye daily as needed. For dry eye relief    ASPIRIN (BAYER ASPIRIN) 325 MG TABLET    Take 325 mg by mouth daily.     OMEPRAZOLE (PRILOSEC) 20 MG CAPSULE    Take 20 mg by mouth 2 (two) times daily.     SALINE NASAL MIST NA    Place 1 spray into the nose daily as needed. For congestion      ALLERGIES:  Allergies as of 09/17/2011 - Review Complete 09/17/2011  Allergen Reaction Noted  . Ibuprofen Other (See Comments) 08/12/2011  . Vicodin (hydrocodone-acetaminophen) Other (See Comments) 07/26/2011     FAMILY HISTORY:  Family History  Problem Relation Age of Onset  .  Asthma Mother   . Heart failure Mother   . Cancer Mother   . Diabetes Mother   . Hypertension Mother   . Stroke Mother   . Heart failure Father   . Diabetes Father      SOCIAL HISTORY: History  Substance Use Topics  . Smoking status: Current Everyday Smoker -- 0.5 packs/day for 20 years    Types: Cigarettes  . Smokeless tobacco: Never Used  . Alcohol Use: No      Review of Systems  Review of Systems ROS: Statement: All systems negative except as marked or noted in the HPI; Constitutional: Negative for fever and chills. ; ; Eyes: Negative for eye pain, redness and discharge. ; ; ENMT: +epistaxis.  Negative for ear pain, hoarseness, nasal congestion, sinus pressure and sore throat. ; ; Cardiovascular: Negative for chest pain, palpitations, diaphoresis, dyspnea and peripheral edema. ; ; Respiratory: Negative for cough, wheezing and stridor. ; ; Gastrointestinal: +N/V, abd pain, blood in emesis.  Negative for  diarrhea, blood in stool, jaundice and rectal bleeding. . ; ; Genitourinary: Negative for dysuria, flank pain and hematuria. ; ; Musculoskeletal: Negative for back pain and neck pain. Negative for swelling and trauma.; ;  Skin: Negative for pruritus, rash, abrasions, blisters, bruising and skin lesion.; ; Neuro: Negative for headache, lightheadedness and neck stiffness. Negative for weakness, altered level of consciousness , altered mental status, extremity weakness, paresthesias, involuntary movement, seizure and syncope.     Physical Exam    BP 129/86  Pulse 80  Temp(Src) 97.5 F (36.4 C) (Oral)  Resp 18  Ht 5\' 7"  (1.702 m)  Wt 170 lb (77.111 kg)  BMI 26.63 kg/m2  SpO2 100%  LMP 08/17/2011  Physical Exam 0715: Physical examination:  Nursing notes reviewed; Vital signs and O2 SAT reviewed;  Constitutional: Well developed, Well nourished, Well hydrated, In no acute distress; Head:  Normocephalic, atraumatic; Eyes: EOMI, PERRL, No scleral icterus; ENMT: No epistaxis, no dried  blood in nares bilat.  No blood in post pharynx or mouth.  Mouth and pharynx normal, Mucous membranes moist; Neck: Supple, Full range of motion, No lymphadenopathy; Cardiovascular: Regular rate and rhythm, No murmur, rub, or gallop; Respiratory: Breath sounds clear & equal bilaterally, No rales, rhonchi, wheezes, or rub, Normal respiratory effort/excursion; Chest: Nontender, Movement normal; Abdomen: Soft, +mild diffuse tenderness to palp.  No rebound or guarding. Nondistended, Normal bowel sounds, Rectal exam performed w/permission of pt and ED RN chaparone present.  Anal tone normal.  Non-tender, soft brown stool in rectal vault, heme neg.  No fissures, no external hemorrhoids, no palp masses.  Extremities: Pulses normal, No tenderness, No edema, No calf edema or asymmetry.; Neuro: AA&Ox3, Major CN grossly intact. No facial droop, speech clear.  Normal coordination. No gross focal motor or sensory deficits in extremities.; Skin: Color normal, Warm, Dry.    MDM Reviewed: previous chart, nursing note and vitals Reviewed previous: labs Interpretation: labs and x-ray   Results for orders placed during the hospital encounter of 09/17/11  LIPASE, BLOOD      Component Value Range   Lipase 81 (*) 11 - 59 (U/L)  URINALYSIS, ROUTINE W REFLEX MICROSCOPIC      Component Value Range   Color, Urine YELLOW  YELLOW    Appearance HAZY (*) CLEAR    Specific Gravity, Urine 1.010  1.005 - 1.030    pH 6.5  5.0 - 8.0    Glucose, UA NEGATIVE  NEGATIVE (mg/dL)   Hgb urine dipstick LARGE (*) NEGATIVE    Bilirubin Urine NEGATIVE  NEGATIVE    Ketones, ur NEGATIVE  NEGATIVE (mg/dL)   Protein, ur NEGATIVE  NEGATIVE (mg/dL)   Urobilinogen, UA 0.2  0.0 - 1.0 (mg/dL)   Nitrite NEGATIVE  NEGATIVE    Leukocytes, UA NEGATIVE  NEGATIVE   CBC      Component Value Range   WBC 6.6  4.0 - 10.5 (K/uL)   RBC 4.16  3.87 - 5.11 (MIL/uL)   Hemoglobin 12.5  12.0 - 15.0 (g/dL)   HCT 16.1  09.6 - 04.5 (%)   MCV 89.4  78.0 -  100.0 (fL)   MCH 30.0  26.0 - 34.0 (pg)   MCHC 33.6  30.0 - 36.0 (g/dL)   RDW 40.9  81.1 - 91.4 (%)   Platelets 292  150 - 400 (K/uL)  DIFFERENTIAL      Component Value Range   Neutrophils Relative 51  43 - 77 (%)   Neutro Abs 3.3  1.7 - 7.7 (K/uL)   Lymphocytes Relative 34  12 - 46 (%)   Lymphs Abs 2.2  0.7 - 4.0 (K/uL)   Monocytes Relative 9  3 - 12 (%)   Monocytes Absolute 0.6  0.1 - 1.0 (K/uL)   Eosinophils Relative 6 (*) 0 - 5 (%)   Eosinophils Absolute 0.4  0.0 - 0.7 (K/uL)   Basophils Relative 1  0 - 1 (%)   Basophils Absolute 0.0  0.0 - 0.1 (K/uL)  COMPREHENSIVE METABOLIC PANEL      Component Value Range   Sodium 137  135 - 145 (mEq/L)   Potassium 3.2 (*) 3.5 - 5.1 (mEq/L)   Chloride 102  96 - 112 (mEq/L)   CO2 28  19 - 32 (mEq/L)   Glucose, Bld 93  70 - 99 (mg/dL)   BUN 6  6 - 23 (mg/dL)   Creatinine, Ser 0.45  0.50 - 1.10 (mg/dL)   Calcium 9.1  8.4 - 40.9 (mg/dL)   Total Protein 7.0  6.0 - 8.3 (g/dL)   Albumin 3.5  3.5 - 5.2 (g/dL)   AST 16  0 - 37 (U/L)   ALT 15  0 - 35 (U/L)   Alkaline Phosphatase 60  39 - 117 (U/L)   Total Bilirubin 0.3  0.3 - 1.2 (mg/dL)   GFR calc non Af Amer >60  >60 (mL/min)   GFR calc Af Amer >60  >60 (mL/min)  POCT PREGNANCY, URINE      Component Value Range   Preg Test, Ur NEGATIVE    URINE MICROSCOPIC-ADD ON      Component Value Range   Squamous Epithelial / LPF FEW (*) RARE    WBC, UA 3-6  <3 (WBC/hpf)   RBC / HPF 11-20  <3 (RBC/hpf)   Bacteria, UA RARE  RARE    Ct Abdomen Pelvis W Contrast  08/28/2011  *RADIOLOGY REPORT*  Clinical Data: Abdominal pain.  Distended abdomen.  History of pancreatitis, GERD, cholecystectomy, C-section.  CT ABDOMEN AND PELVIS WITH CONTRAST  Technique:  Multidetector CT imaging of the abdomen and pelvis was performed following the standard protocol during bolus administration of intravenous contrast.  Contrast: 100 ml Omnipaque-300  Comparison: 12/10/2010  Findings: There is mild dependent change at the  lung bases.  The patient has a hiatal hernia.  Heart size is normal.  There are bilateral renal cysts.  No hydronephrosis.  The gallbladder is surgically absent.  There is minimal stranding within the region of the duodenal bulb at the level of the pancreatic head.  The findings may be related to duodenitis, ulcer disease, or less likely, pancreatitis. Otherwise, small and large bowel loops have a normal appearance. The appendix is well seen and has a normal appearance.  The uterus is present.  No adnexal mass or free pelvic fluid identified. No retroperitoneal or mesenteric adenopathy. No evidence for aortic aneurysm. Visualized osseous structures have a normal appearance.  IMPRESSION:  1.  Mild stranding in the region of the duodenal bulb, suggesting duodenitis or peptic ulcer disease.  Differential diagnosis also includes mild pancreatitis. 2.  No evidence for acute appendicitis, bowel obstruction, or abscess.  No evidence for pseudocyst.  Original Report Authenticated By: Patterson Hammersmith, M.D.   Dg Abd Acute W/chest  09/17/2011  *RADIOLOGY REPORT*  Clinical Data: Pancreatitis, abdominal pain  ACUTE ABDOMEN SERIES (ABDOMEN 2 VIEW & CHEST 1 VIEW)  Comparison: 08/28/2011  Findings: Heart size upper limits normal.  Patchy atelectasis or early infiltrates in the lung bases as before.  No effusion.  No free air.  Vascular clips in the right upper abdomen.  Small bowel decompressed.  Normal distribution of gas and stool throughout the colon.  Regional bones unremarkable.  IMPRESSION:  1.  Normal bowel gas pattern. 2.  No free air. 3.  Persistent bibasilar atelectasis or infiltrates.  Original Report Authenticated By: Osa Craver, M.D.   Results for JOSLYN, RAMOS (MRN [id number]) as of 09/17/2011 11:39  Ref. Range 08/20/2011 11:35 08/28/2011 20:26 09/17/2011 07:39  Lipase Latest Range: 11-59 U/L 242 (H) 80 (H) 81 (H)    11:40 AM:  Lipase improved from previous.  Will not repeat CT A/P since already had  CT on 08/28/11 (results above).  Pt states she feels "better now" and wants to go home.  Will replete potassium PO.  H/H normal.  No hx of varices per previous Endoscopy report in 09/2010.  Dx testing d/w pt.  Questions answered.  Verb understanding, agreeable to d/c home with outpt f/u.      MEDICATIONS GIVEN IN THE E.D.  Medications  0.9 %  sodium chloride infusion (not administered)  famotidine (PEPCID) IVPB 20 mg (not administered)  ondansetron (ZOFRAN) injection 4 mg (not administered)  fentaNYL (SUBLIMAZE) 0.05 MG/ML injection 50 mcg (not administered)    New Prescriptions   ONDANSETRON (ZOFRAN) 4 MG TABLET    Take 1 tablet (4 mg total) by mouth every 6 (six) hours as needed for nausea.   [facility] M   I personally performed the services described in this documentation, which was scribed in my presence. The recorded information has been reviewed and considered. [facility] M      Procedures             Laray Anger, DO 09/17/11 2059

## 2011-09-17 NOTE — ED Notes (Signed)
Pt to xray

## 2011-09-17 NOTE — ED Notes (Signed)
Pt states that she is still unable to urinate

## 2011-09-17 NOTE — ED Notes (Signed)
Pt states that she is feeling better, feels like she is able to tolerate the pain at home now and is ready to go home, Dr. Clarene Duke notified,

## 2011-09-18 LAB — URINE CULTURE: Culture  Setup Time: 201209222030

## 2011-09-19 ENCOUNTER — Encounter (HOSPITAL_COMMUNITY): Payer: Self-pay

## 2011-09-19 ENCOUNTER — Emergency Department (HOSPITAL_COMMUNITY)
Admission: EM | Admit: 2011-09-19 | Discharge: 2011-09-19 | Disposition: A | Discharge status: Home / Self Care | Payer: Self-pay | Attending: Emergency Medicine | Admitting: Emergency Medicine

## 2011-09-19 DIAGNOSIS — M549 Dorsalgia, unspecified: Secondary | ICD-10-CM | POA: Insufficient documentation

## 2011-09-19 DIAGNOSIS — R10819 Abdominal tenderness, unspecified site: Secondary | ICD-10-CM | POA: Insufficient documentation

## 2011-09-19 DIAGNOSIS — M25569 Pain in unspecified knee: Secondary | ICD-10-CM | POA: Insufficient documentation

## 2011-09-19 DIAGNOSIS — Z7982 Long term (current) use of aspirin: Secondary | ICD-10-CM | POA: Insufficient documentation

## 2011-09-19 DIAGNOSIS — F17200 Nicotine dependence, unspecified, uncomplicated: Secondary | ICD-10-CM | POA: Insufficient documentation

## 2011-09-19 DIAGNOSIS — K449 Diaphragmatic hernia without obstruction or gangrene: Secondary | ICD-10-CM | POA: Insufficient documentation

## 2011-09-19 DIAGNOSIS — R109 Unspecified abdominal pain: Secondary | ICD-10-CM | POA: Insufficient documentation

## 2011-09-19 DIAGNOSIS — R609 Edema, unspecified: Secondary | ICD-10-CM | POA: Insufficient documentation

## 2011-09-19 LAB — URINALYSIS, ROUTINE W REFLEX MICROSCOPIC
Bilirubin Urine: NEGATIVE
Ketones, ur: NEGATIVE mg/dL
Leukocytes, UA: NEGATIVE
Nitrite: NEGATIVE
Specific Gravity, Urine: 1.005 (ref 1.005–1.030)
Urobilinogen, UA: 0.2 mg/dL (ref 0.0–1.0)
pH: 6.5 (ref 5.0–8.0)

## 2011-09-19 LAB — HEPATIC FUNCTION PANEL
Alkaline Phosphatase: 56 U/L (ref 39–117)
Total Bilirubin: 0.1 mg/dL — ABNORMAL LOW (ref 0.3–1.2)
Total Protein: 6.7 g/dL (ref 6.0–8.3)

## 2011-09-19 LAB — BASIC METABOLIC PANEL
CO2: 23 mEq/L (ref 19–32)
Chloride: 101 mEq/L (ref 96–112)
Creatinine, Ser: 0.77 mg/dL (ref 0.50–1.10)
GFR calc Af Amer: >60 mL/min (ref >60)
Sodium: 136 mEq/L (ref 135–145)

## 2011-09-19 LAB — DIFFERENTIAL
Basophils Absolute: 0 10*3/uL (ref 0.0–0.1)
Basophils Relative: 0 % (ref 0–1)
Lymphocytes Relative: 35 % (ref 12–46)
Monocytes Absolute: 0.5 10*3/uL (ref 0.1–1.0)
Neutro Abs: 4.2 10*3/uL (ref 1.7–7.7)
Neutrophils Relative %: 55 % (ref 43–77)

## 2011-09-19 LAB — CBC
MCHC: 33.4 g/dL (ref 30.0–36.0)
Platelets: 297 10*3/uL (ref 150–400)
RDW: 13.8 % (ref 11.5–15.5)
WBC: 7.7 10*3/uL (ref 4.0–10.5)

## 2011-09-19 LAB — URINE MICROSCOPIC-ADD ON

## 2011-09-19 LAB — LIPASE, BLOOD: Lipase: 64 U/L — ABNORMAL HIGH (ref 11–59)

## 2011-09-19 MED ORDER — SODIUM CHLORIDE 0.9 % IV BOLUS (SEPSIS)
1000.0000 mL | Freq: Once | INTRAVENOUS | Status: AC
  Administered 2011-09-19 (×2): 1000 mL via INTRAVENOUS

## 2011-09-19 MED ORDER — HYDROMORPHONE HCL 1 MG/ML IJ SOLN
1.0000 mg | Freq: Once | INTRAMUSCULAR | Status: AC
  Administered 2011-09-19: 1 mg via INTRAVENOUS
  Filled 2011-09-19: qty 1

## 2011-09-19 MED ORDER — ONDANSETRON HCL 4 MG/2ML IJ SOLN
4.0000 mg | Freq: Once | INTRAMUSCULAR | Status: AC
  Administered 2011-09-19: 4 mg via INTRAVENOUS
  Filled 2011-09-19: qty 2

## 2011-09-19 NOTE — ED Notes (Signed)
IV to left AC not running well. Pt c/o pain at site. IV pulled and 20 G placed to right AC.

## 2011-09-19 NOTE — ED Provider Notes (Signed)
History   Chart scribed for Rita B. Bernette Mayers, MD by [name]; the patient was seen in room APA09/APA09; this patient's care was started at 7:13 AM.    CSN: [id number] Arrival date & time: 09/19/2011  6:34 AM  Chief Complaint  Patient presents with  . Abdominal Pain    HPI Rita [name] is a 45 y.o. female who presents to the Emergency Department complaining of abd pain. Pt reports waking with moderate upper abd pain this AM, approx 3 hours ago, associated with nausea and vomiting x 3-4 as well as mild diarrhea. Emesis non-bilious but pt reports it is streaked with dark blood. Pt h/o pancreatitis and states pain and n/v are same as her past episodes of pancreatitis. Pt denies taking any tylenol. Pt denies recent cough, fever, chills, back pain, or urinary complaints. No chest pain or sob. Pt also with c/o mild hand and leg swelling noticed this AM, denies pain, redness, or h/o DVT.   PCP [facility]  Past Medical History  Diagnosis Date  . Pancreatitis   . Peptic ulcer   . GERD (gastroesophageal reflux disease)   . Hiatal hernia   . Chronic back pain     Past Surgical History  Procedure Date  . Cholecystectomy   . Cesarean section   . Tubial ligation     Family History  Problem Relation Age of Onset  . Asthma Mother   . Heart failure Mother   . Cancer Mother   . Diabetes Mother   . Hypertension Mother   . Stroke Mother   . Heart failure Father   . Diabetes Father     History  Substance Use Topics  . Smoking status: Current Everyday Smoker -- 0.5 packs/day for 20 years    Types: Cigarettes  . Smokeless tobacco: Never Used  . Alcohol Use: No    OB History    Grav Para Term Preterm Abortions TAB SAB Ect Mult Living   4 3 3  1  1   3       Review of Systems 10 Systems reviewed and are negative for acute change except as noted in the HPI.   Allergies  Ibuprofen and Vicodin  Home Medications   Current Outpatient Rx  Name Route Sig Dispense Refill  .  OMEPRAZOLE 20 MG PO CPDR Oral Take 20 mg by mouth 2 (two) times daily.      Marland Kitchen ONDANSETRON HCL 4 MG PO TABS Oral Take 1 tablet (4 mg total) by mouth every 6 (six) hours as needed for nausea. 6 tablet 0  . DRY EYES OP Ophthalmic Apply 1 drop to eye daily as needed. For dry eye relief     . ASPIRIN 325 MG PO TABS Oral Take 325 mg by mouth daily.      Marland Kitchen SALINE NASAL MIST NA Nasal Place 1 spray into the nose daily as needed. For congestion       Physical Exam    BP 135/71  Pulse 80  Temp(Src) 98.4 F (36.9 C) (Oral)  Resp 20  Ht 5\' 7"  (1.702 m)  Wt 170 lb (77.111 kg)  BMI 26.63 kg/m2  SpO2 100%  LMP 08/27/2011  Physical Exam  Nursing note and vitals reviewed. Constitutional: She is oriented to person, place, and time. She appears well-developed and well-nourished. No distress.  HENT:  Head: Normocephalic.  Mouth/Throat: Oropharynx is clear and moist and mucous membranes are normal.  Eyes: Conjunctivae are normal.  Neck: Normal range of motion. Neck  supple.  Cardiovascular: Normal rate, regular rhythm and intact distal pulses.  Exam reveals no gallop and no friction rub.   No murmur heard. Pulmonary/Chest: Effort normal and breath sounds normal. She has no wheezes. She has no rales.  Abdominal: Soft. Bowel sounds are normal. She exhibits no distension. There is tenderness (mild diffuse upper abd). There is no guarding.  Musculoskeletal: Normal range of motion. She exhibits edema (1+ bilaterally).  Neurological: She is alert and oriented to person, place, and time.  Skin: Skin is warm and dry. No rash noted.  Psychiatric: She has a normal mood and affect.    ED Course  Procedures - none  LABS/RADIOLOGY: Results for orders placed during the hospital encounter of 09/19/11  CBC      Component Value Range   WBC 7.7  4.0 - 10.5 (K/uL)   RBC 4.16  3.87 - 5.11 (MIL/uL)   Hemoglobin 12.5  12.0 - 15.0 (g/dL)   HCT 16.1  09.6 - 04.5 (%)   MCV 89.9  78.0 - 100.0 (fL)   MCH 30.0  26.0  - 34.0 (pg)   MCHC 33.4  30.0 - 36.0 (g/dL)   RDW 40.9  81.1 - 91.4 (%)   Platelets 297  150 - 400 (K/uL)  DIFFERENTIAL      Component Value Range   Neutrophils Relative 55  43 - 77 (%)   Neutro Abs 4.2  1.7 - 7.7 (K/uL)   Lymphocytes Relative 35  12 - 46 (%)   Lymphs Abs 2.7  0.7 - 4.0 (K/uL)   Monocytes Relative 6  3 - 12 (%)   Monocytes Absolute 0.5  0.1 - 1.0 (K/uL)   Eosinophils Relative 4  0 - 5 (%)   Eosinophils Absolute 0.3  0.0 - 0.7 (K/uL)   Basophils Relative 0  0 - 1 (%)   Basophils Absolute 0.0  0.0 - 0.1 (K/uL)  BASIC METABOLIC PANEL      Component Value Range   Sodium 136  135 - 145 (mEq/L)   Potassium 3.1 (*) 3.5 - 5.1 (mEq/L)   Chloride 101  96 - 112 (mEq/L)   CO2 23  19 - 32 (mEq/L)   Glucose, Bld 140 (*) 70 - 99 (mg/dL)   BUN 7  6 - 23 (mg/dL)   Creatinine, Ser 7.82  0.50 - 1.10 (mg/dL)   Calcium 8.9  8.4 - 95.6 (mg/dL)   GFR calc non Af Amer >60  >60 (mL/min)   GFR calc Af Amer >60  >60 (mL/min)  LIPASE, BLOOD      Component Value Range   Lipase 64 (*) 11 - 59 (U/L)  URINALYSIS, ROUTINE W REFLEX MICROSCOPIC      Component Value Range   Color, Urine YELLOW  YELLOW    Appearance CLEAR  CLEAR    Specific Gravity, Urine 1.005  1.005 - 1.030    pH 6.5  5.0 - 8.0    Glucose, UA NEGATIVE  NEGATIVE (mg/dL)   Hgb urine dipstick LARGE (*) NEGATIVE    Bilirubin Urine NEGATIVE  NEGATIVE    Ketones, ur NEGATIVE  NEGATIVE (mg/dL)   Protein, ur NEGATIVE  NEGATIVE (mg/dL)   Urobilinogen, UA 0.2  0.0 - 1.0 (mg/dL)   Nitrite NEGATIVE  NEGATIVE    Leukocytes, UA NEGATIVE  NEGATIVE   PREGNANCY, URINE      Component Value Range   Preg Test, Ur NEGATIVE    HEPATIC FUNCTION PANEL      Component Value Range  Total Protein 6.7  6.0 - 8.3 (g/dL)   Albumin 3.4 (*) 3.5 - 5.2 (g/dL)   AST 15  0 - 37 (U/L)   ALT 16  0 - 35 (U/L)   Alkaline Phosphatase 56  39 - 117 (U/L)   Total Bilirubin 0.1 (*) 0.3 - 1.2 (mg/dL)   Bilirubin, Direct <1.6  0.0 - 0.3 (mg/dL)   Indirect  Bilirubin NOT CALCULATED  0.3 - 0.9 (mg/dL)  URINE MICROSCOPIC-ADD ON      Component Value Range   Squamous Epithelial / LPF FEW (*) RARE    RBC / HPF 7-10  <3 (RBC/hpf)   Bacteria, UA FEW (*) RARE     9:01 AM - All results reviewed and discussed with pt, questions answered, pt agreeable with plan. Pt still c/o pain and requesting more pain meds prior to discharge.  MDM: Pt seen multiple times recently for same. Had Lipase elevation about a month ago that had been improving with each subsequent visit including today. No indication for imaging. Pt also complaining of some soft tissue swelling to hands and legs, but unclear acuity. Doubt DVT or CHF.   SCRIBE ATTESTATION: I personally performed the services described in the documentation, which were scribed in my presence. The recorded information has been reviewed and considered.   Rita B. Bernette Mayers, MD  Pt feeling better, but would like more pain medications. Her secondary complaints of soft tissue swelling and L knee pain are unclear as to etiology, but I not feel they are due to an emergent medical condition and do not require any additional ED evaluation.        Rita B. Bernette Mayers, MD 09/19/11 [phone]

## 2011-09-19 NOTE — ED Notes (Signed)
Pt reports having a h/o pancreatitis, began having severe ab pain this am, +vomiting blood at times. +diarrhea and pain

## 2011-10-05 LAB — CBC
Hemoglobin: 9.7 — ABNORMAL LOW
MCHC: 34.4
MCV: 87
MCV: 87.7
Platelets: 305
RBC: 3.29 — ABNORMAL LOW
RBC: 3.64 — ABNORMAL LOW
RBC: 4.11
WBC: 10.3
WBC: 14.4 — ABNORMAL HIGH
WBC: 8

## 2011-10-05 LAB — WET PREP, GENITAL
Clue Cells Wet Prep HPF POC: NONE SEEN
WBC, Wet Prep HPF POC: NONE SEEN
Yeast Wet Prep HPF POC: NONE SEEN

## 2011-10-05 LAB — BASIC METABOLIC PANEL
BUN: 5 — ABNORMAL LOW
CO2: 26
Calcium: 7.7 — ABNORMAL LOW
Calcium: 7.9 — ABNORMAL LOW
Calcium: 8.3 — ABNORMAL LOW
Chloride: 106
Chloride: 107
Creatinine, Ser: 0.61
Creatinine, Ser: 0.66
Creatinine, Ser: 0.72
GFR calc Af Amer: >60
GFR calc Af Amer: >60
GFR calc Af Amer: >60
GFR calc non Af Amer: >60
GFR calc non Af Amer: >60
Sodium: 140
Sodium: 142

## 2011-10-05 LAB — DIFFERENTIAL
Eosinophils Absolute: 0
Eosinophils Absolute: 0.1
Eosinophils Relative: 0
Lymphocytes Relative: 9 — ABNORMAL LOW
Lymphs Abs: 0.9
Lymphs Abs: 1.1
Lymphs Abs: 1.8
Monocytes Absolute: 0.6
Monocytes Relative: 10
Monocytes Relative: 8
Neutro Abs: 5.4
Neutro Abs: 8.3 — ABNORMAL HIGH
Neutrophils Relative %: 67
Neutrophils Relative %: 80 — ABNORMAL HIGH

## 2011-10-05 LAB — URINALYSIS, ROUTINE W REFLEX MICROSCOPIC
Bilirubin Urine: NEGATIVE
Glucose, UA: 100 — AB
Ketones, ur: NEGATIVE
Protein, ur: 100 — AB
pH: 6

## 2011-10-05 LAB — CULTURE, BLOOD (ROUTINE X 2)
Culture: NO GROWTH
Report Status: 10252008

## 2011-10-05 LAB — COMPREHENSIVE METABOLIC PANEL
ALT: 15
AST: 19
Alkaline Phosphatase: 74
CO2: 30
Calcium: 7.9 — ABNORMAL LOW
Chloride: 95 — ABNORMAL LOW
GFR calc Af Amer: >60
GFR calc non Af Amer: >60
Potassium: 2.7 — CL
Sodium: 133 — ABNORMAL LOW

## 2011-10-05 LAB — URINE CULTURE

## 2011-10-05 LAB — POTASSIUM
Potassium: 2.9 — ABNORMAL LOW
Potassium: 3.2 — ABNORMAL LOW
Potassium: 3.4 — ABNORMAL LOW

## 2011-10-05 LAB — URINE MICROSCOPIC-ADD ON

## 2011-10-05 LAB — LIPASE, BLOOD: Lipase: 12

## 2011-10-05 LAB — GC/CHLAMYDIA PROBE AMP, GENITAL
Chlamydia, DNA Probe: NEGATIVE
GC Probe Amp, Genital: NEGATIVE

## 2011-10-08 ENCOUNTER — Emergency Department (HOSPITAL_COMMUNITY)
Admission: EM | Admit: 2011-10-08 | Discharge: 2011-10-08 | Disposition: A | Discharge status: Home / Self Care | Payer: Self-pay | Attending: Emergency Medicine | Admitting: Emergency Medicine

## 2011-10-08 ENCOUNTER — Emergency Department (HOSPITAL_COMMUNITY): Payer: Self-pay

## 2011-10-08 ENCOUNTER — Encounter (HOSPITAL_COMMUNITY): Payer: Self-pay | Admitting: *Deleted

## 2011-10-08 DIAGNOSIS — R42 Dizziness and giddiness: Secondary | ICD-10-CM | POA: Insufficient documentation

## 2011-10-08 DIAGNOSIS — K219 Gastro-esophageal reflux disease without esophagitis: Secondary | ICD-10-CM | POA: Insufficient documentation

## 2011-10-08 DIAGNOSIS — R51 Headache: Secondary | ICD-10-CM | POA: Insufficient documentation

## 2011-10-08 DIAGNOSIS — K449 Diaphragmatic hernia without obstruction or gangrene: Secondary | ICD-10-CM | POA: Insufficient documentation

## 2011-10-08 DIAGNOSIS — R350 Frequency of micturition: Secondary | ICD-10-CM | POA: Insufficient documentation

## 2011-10-08 DIAGNOSIS — IMO0001 Reserved for inherently not codable concepts without codable children: Secondary | ICD-10-CM | POA: Insufficient documentation

## 2011-10-08 DIAGNOSIS — M25469 Effusion, unspecified knee: Secondary | ICD-10-CM

## 2011-10-08 DIAGNOSIS — R062 Wheezing: Secondary | ICD-10-CM | POA: Insufficient documentation

## 2011-10-08 DIAGNOSIS — H539 Unspecified visual disturbance: Secondary | ICD-10-CM | POA: Insufficient documentation

## 2011-10-08 DIAGNOSIS — M549 Dorsalgia, unspecified: Secondary | ICD-10-CM | POA: Insufficient documentation

## 2011-10-08 DIAGNOSIS — K279 Peptic ulcer, site unspecified, unspecified as acute or chronic, without hemorrhage or perforation: Secondary | ICD-10-CM | POA: Insufficient documentation

## 2011-10-08 DIAGNOSIS — M7918 Myalgia, other site: Secondary | ICD-10-CM

## 2011-10-08 DIAGNOSIS — R5381 Other malaise: Secondary | ICD-10-CM | POA: Insufficient documentation

## 2011-10-08 MED ORDER — OXYCODONE-ACETAMINOPHEN 5-325 MG PO TABS
1.0000 | ORAL_TABLET | Freq: Once | ORAL | Status: AC
  Administered 2011-10-08: 1 via ORAL
  Filled 2011-10-08: qty 1

## 2011-10-08 MED ORDER — TRAMADOL HCL 50 MG PO TABS
50.0000 mg | ORAL_TABLET | Freq: Four times a day (QID) | ORAL | Status: AC | PRN
Start: 2011-10-08 — End: 2011-10-18

## 2011-10-08 NOTE — ED Provider Notes (Signed)
History     CSN: [id number] Arrival date & time: 10/08/2011  8:52 AM  Chief Complaint  Patient presents with  . Hip Pain   HPI Rita [name] is a 45 y.o. female who presents to the ED for pain in the left hip that started 3 weeks ago. Also has swelling of the left knee that started 4 weeks ago. Over the past 3 days the pain has gotten much worse. Nausea associated with pain. She rates her pain as 9/10 with weight bearing and 10/10 with sitting. Pain increases with sneezing, cough or pressure.   Past Medical History  Diagnosis Date  . Pancreatitis   . Peptic ulcer   . GERD (gastroesophageal reflux disease)   . Hiatal hernia   . Chronic back pain     Past Surgical History  Procedure Date  . Cholecystectomy   . Cesarean section   . Tubial ligation     Family History  Problem Relation Age of Onset  . Asthma Mother   . Heart failure Mother   . Cancer Mother   . Diabetes Mother   . Hypertension Mother   . Stroke Mother   . Heart failure Father   . Diabetes Father     History  Substance Use Topics  . Smoking status: Current Everyday Smoker -- 0.5 packs/day for 20 years    Types: Cigarettes  . Smokeless tobacco: Never Used  . Alcohol Use: No    OB History    Grav Para Term Preterm Abortions TAB SAB Ect Mult Living   4 3 3  1  1   3       Review of Systems  Constitutional: Positive for chills and fatigue. Negative for fever and diaphoresis.  HENT: Positive for dental problem. Negative for ear pain, congestion, sore throat, facial swelling, neck pain, neck stiffness and sinus pressure.   Eyes: Positive for visual disturbance. Negative for photophobia, pain and discharge.  Respiratory: Positive for wheezing. Negative for cough and chest tightness.   Cardiovascular: Negative.   Gastrointestinal: Negative for nausea, vomiting, diarrhea, constipation and abdominal distention.  Genitourinary: Positive for frequency. Negative for dysuria, flank pain, vaginal bleeding,  vaginal discharge and difficulty urinating.  Musculoskeletal: Negative for myalgias, back pain and gait problem.  Skin: Negative for color change and rash.  Neurological: Positive for light-headedness and headaches. Negative for dizziness, speech difficulty, weakness and numbness.  Psychiatric/Behavioral: Negative for confusion and agitation.    Allergies  Ibuprofen and Vicodin  Home Medications   Current Outpatient Rx  Name Route Sig Dispense Refill  . DRY EYES OP Ophthalmic Apply 1 drop to eye daily as needed. For dry eye relief     . ASPIRIN 325 MG PO TABS Oral Take 325 mg by mouth daily.      Marland Kitchen OMEPRAZOLE 20 MG PO CPDR Oral Take 20 mg by mouth 2 (two) times daily.      Marland Kitchen SALINE NASAL MIST NA Nasal Place 1 spray into the nose daily as needed. For congestion       BP 131/105  Pulse 82  Temp(Src) 97.9 F (36.6 C) (Oral)  Resp 18  Ht 5\' 7"  (1.702 m)  Wt 170 lb (77.111 kg)  BMI 26.63 kg/m2  SpO2 98%  LMP 08/27/2011  Physical Exam  Nursing note and vitals reviewed. Constitutional: She is oriented to person, place, and time. She appears well-developed and well-nourished. No distress.       Blood pressure elevated  HENT:  Head: Normocephalic  and atraumatic.  Eyes: Conjunctivae and EOM are normal. Pupils are equal, round, and reactive to light.  Neck: Normal range of motion. Neck supple.  Cardiovascular: Normal rate and regular rhythm.   Pulmonary/Chest: Effort normal and breath sounds normal.  Abdominal: Soft. There is no tenderness.  Musculoskeletal:       Left knee with edema and tenderness on palpation. Tenderness left buttocks with deep palpation. No tenderness over bony prominence.  Neurological: She is alert and oriented to person, place, and time. No cranial nerve deficit.  Skin: Skin is warm and dry.   Dg Knee Complete 4 Views Left  10/08/2011  *RADIOLOGY REPORT*  Clinical Data: Left knee pain/swelling  LEFT KNEE - COMPLETE 4+ VIEW  Comparison: None.  Findings: No  fracture or dislocation is seen.  Mild degenerative changes in the medial and lateral compartments.  Small suprapatellar knee joint effusion.  IMPRESSION: Degenerative changes with small suprapatellar knee joint effusion.  Original Report Authenticated By: Charline Bills, M.D.   Assessment: Pain left buttock   Left knee effusion  Plan:  Knee immobilizer    Ice, elevate   Follow up with ortho   [facility] ED Course  Procedures     MDM          Kerrie Buffalo, NP 10/08/11 1113

## 2011-10-08 NOTE — ED Notes (Signed)
Pt c/o left sided hip pain that radiates down her leg. Pt also c/o left knee pain.

## 2011-10-08 NOTE — ED Notes (Signed)
Pt c/o pain in her left hip radiating down to her knee. States that her left knee is swollen. States that her pain has gotten worse in the last 3 days.

## 2011-10-08 NOTE — ED Provider Notes (Signed)
Medical screening examination/treatment/procedure(s) were performed by non-physician practitioner and as supervising physician I was immediately available for consultation/collaboration. Devoria Albe, MD, FACEP   Ward Givens, MD 10/08/11 1120

## 2011-10-15 ENCOUNTER — Emergency Department (HOSPITAL_COMMUNITY)
Admission: EM | Admit: 2011-10-15 | Discharge: 2011-10-15 | Disposition: A | Discharge status: Home / Self Care | Payer: Self-pay | Attending: Emergency Medicine | Admitting: Emergency Medicine

## 2011-10-15 ENCOUNTER — Encounter (HOSPITAL_COMMUNITY): Payer: Self-pay | Admitting: *Deleted

## 2011-10-15 DIAGNOSIS — K219 Gastro-esophageal reflux disease without esophagitis: Secondary | ICD-10-CM | POA: Insufficient documentation

## 2011-10-15 DIAGNOSIS — M549 Dorsalgia, unspecified: Secondary | ICD-10-CM | POA: Insufficient documentation

## 2011-10-15 DIAGNOSIS — K279 Peptic ulcer, site unspecified, unspecified as acute or chronic, without hemorrhage or perforation: Secondary | ICD-10-CM | POA: Insufficient documentation

## 2011-10-15 DIAGNOSIS — M25469 Effusion, unspecified knee: Secondary | ICD-10-CM | POA: Insufficient documentation

## 2011-10-15 DIAGNOSIS — M25462 Effusion, left knee: Secondary | ICD-10-CM

## 2011-10-15 DIAGNOSIS — Z7982 Long term (current) use of aspirin: Secondary | ICD-10-CM | POA: Insufficient documentation

## 2011-10-15 DIAGNOSIS — F17200 Nicotine dependence, unspecified, uncomplicated: Secondary | ICD-10-CM | POA: Insufficient documentation

## 2011-10-15 MED ORDER — OXYCODONE-ACETAMINOPHEN 5-325 MG PO TABS
1.0000 | ORAL_TABLET | Freq: Once | ORAL | Status: AC
  Administered 2011-10-15: 1 via ORAL
  Filled 2011-10-15: qty 1

## 2011-10-15 MED ORDER — OXYCODONE-ACETAMINOPHEN 5-325 MG PO TABS: 1.0000 | ORAL_TABLET | ORAL | Status: AC | PRN

## 2011-10-15 NOTE — ED Notes (Signed)
Pt a/ox4. Resp even and unlabored. NAD at this time. D/C instructions and Rx x1 reviewed with pt. Pt verbalized understanding. Pt ambulated to POV with steady gate. Husband to transport home.

## 2011-10-15 NOTE — ED Notes (Signed)
Pt c/o left knee pain and left leg pain; pt states she was seen here x 1 week ago with same complaint, but is unable to see Dr. Romeo Apple until she saves up the money

## 2011-10-18 NOTE — ED Provider Notes (Signed)
History     CSN: [id number] Arrival date & time: 10/15/2011  2:15 PM   First MD Initiated Contact with Patient 10/15/11 1416      Chief Complaint  Patient presents with  . Leg Pain    left  . Knee Pain    (Consider location/radiation/quality/duration/timing/severity/associated sxs/prior treatment) Patient is a 45 y.o. female presenting with leg pain and knee pain. The history is provided by the patient.  Leg Pain  The incident occurred more than 1 week ago. Incident location: She presents for pain management for her left knee which was diagnosed here one week ago via xray as djd with a small suprapatellar effusion.  She has not been able to see orthopedics for further management due to financial reasons.  There was no injury mechanism. The pain is present in the left knee. The quality of the pain is described as aching and sharp. The pain is at a severity of 8/10. The pain is moderate. The pain has been constant since onset. Pertinent negatives include no numbness, no inability to bear weight, no loss of motion, no muscle weakness and no loss of sensation. The symptoms are aggravated by bearing weight, activity and palpation. Treatments tried: Ultram. The treatment provided no relief.  Knee Pain Associated symptoms include arthralgias and joint swelling. Pertinent negatives include no chest pain, chills, congestion, fever, headaches, nausea, numbness, rash or weakness.    Past Medical History  Diagnosis Date  . Pancreatitis   . Peptic ulcer   . GERD (gastroesophageal reflux disease)   . Hiatal hernia   . Chronic back pain     Past Surgical History  Procedure Date  . Cholecystectomy   . Cesarean section   . Tubial ligation     Family History  Problem Relation Age of Onset  . Asthma Mother   . Heart failure Mother   . Cancer Mother   . Diabetes Mother   . Hypertension Mother   . Stroke Mother   . Heart failure Father   . Diabetes Father     History  Substance Use  Topics  . Smoking status: Current Everyday Smoker -- 0.5 packs/day for 20 years    Types: Cigarettes  . Smokeless tobacco: Never Used  . Alcohol Use: No    OB History    Grav Para Term Preterm Abortions TAB SAB Ect Mult Living   4 3 3  1  1   3       Review of Systems  Constitutional: Negative for fever and chills.  HENT: Negative for congestion.   Eyes: Negative.   Respiratory: Negative for shortness of breath.   Cardiovascular: Negative for chest pain.  Gastrointestinal: Negative for nausea.  Genitourinary: Negative.   Musculoskeletal: Positive for joint swelling and arthralgias.  Skin: Negative.  Negative for rash and wound.  Neurological: Negative for dizziness, weakness, light-headedness, numbness and headaches.  Hematological: Negative.   Psychiatric/Behavioral: Negative.     Allergies  Ibuprofen and Vicodin  Home Medications   Current Outpatient Rx  Name Route Sig Dispense Refill  . OMEPRAZOLE 20 MG PO CPDR Oral Take 20 mg by mouth 2 (two) times daily.      . TRAMADOL HCL 50 MG PO TABS Oral Take 1 tablet (50 mg total) by mouth every 6 (six) hours as needed for pain. 30 tablet 0  . DRY EYES OP Ophthalmic Apply 1 drop to eye daily as needed. For dry eye relief     . ASPIRIN 325 MG PO  TABS Oral Take 325 mg by mouth daily.      . OXYCODONE-ACETAMINOPHEN 5-325 MG PO TABS Oral Take 1 tablet by mouth every 4 (four) hours as needed for pain. 20 tablet 0  . SALINE NASAL MIST NA Nasal Place 1 spray into the nose daily as needed. For congestion       BP 141/91  Pulse 74  Temp(Src) 97.7 F (36.5 C) (Oral)  Resp 18  Ht 5\' 7"  (1.702 m)  Wt 170 lb (77.111 kg)  BMI 26.63 kg/m2  SpO2 100%  LMP 08/27/2011  Physical Exam  Nursing note and vitals reviewed. Constitutional: She is oriented to person, place, and time. She appears well-developed and well-nourished. No distress.  HENT:  Head: Normocephalic and atraumatic.  Eyes: Conjunctivae and EOM are normal. Pupils are  equal, round, and reactive to light.  Neck: Normal range of motion. Neck supple.  Cardiovascular: Normal rate and regular rhythm.   Pulmonary/Chest: Effort normal and breath sounds normal.  Abdominal: Soft. There is no tenderness.  Musculoskeletal:       Left knee with edema and tenderness on palpation. Tenderness left buttocks with deep palpation. No tenderness over bony prominence.  Neurological: She is alert and oriented to person, place, and time. No cranial nerve deficit.  Skin: Skin is warm and dry.    ED Course  Procedures (including critical care time)  Labs Reviewed - No data to display No results found.   1. Knee effusion, left       MDM  Changed pain medicine to oxycodone which gave better relief while in ed.  Continued RICE.  Patient has crutches and knee immobilizer.  Stressed need to f/u with ortho.          Candis Musa, PA 10/18/11 1507

## 2011-10-21 NOTE — ED Provider Notes (Signed)
Evaluation and management procedures were performed by the PA/NP under my supervision/collaboration.    Felisa Bonier, MD 10/21/11 2123

## 2011-10-24 ENCOUNTER — Emergency Department (HOSPITAL_COMMUNITY)
Admission: EM | Admit: 2011-10-24 | Discharge: 2011-10-24 | Discharge status: Against Medical Advice | Payer: Self-pay | Attending: Emergency Medicine | Admitting: Emergency Medicine

## 2011-10-24 ENCOUNTER — Encounter (HOSPITAL_COMMUNITY): Payer: Self-pay | Admitting: *Deleted

## 2011-10-24 DIAGNOSIS — Z5320 Procedure and treatment not carried out because of patient's decision for unspecified reasons: Secondary | ICD-10-CM | POA: Insufficient documentation

## 2011-10-24 DIAGNOSIS — IMO0002 Reserved for concepts with insufficient information to code with codable children: Secondary | ICD-10-CM | POA: Insufficient documentation

## 2011-10-24 DIAGNOSIS — T18108A Unspecified foreign body in esophagus causing other injury, initial encounter: Secondary | ICD-10-CM | POA: Insufficient documentation

## 2011-10-24 NOTE — ED Notes (Signed)
No answer when called to treatment area 

## 2011-10-24 NOTE — ED Notes (Addendum)
Pt states that she has had pork chop stuck in her throat since last night. States that she has had to have her esophagus stretched before. Pt able to speak with no difficulty. No drooling noted. States that she cant' swallow anything.

## 2011-10-24 NOTE — ED Notes (Signed)
No answer when called to the treatment area.

## 2011-11-11 ENCOUNTER — Encounter (HOSPITAL_COMMUNITY): Payer: Self-pay

## 2011-11-11 ENCOUNTER — Emergency Department (HOSPITAL_COMMUNITY)
Admission: EM | Admit: 2011-11-11 | Discharge: 2011-11-11 | Disposition: A | Discharge status: Home / Self Care | Payer: No Typology Code available for payment source | Attending: Emergency Medicine | Admitting: Emergency Medicine

## 2011-11-11 DIAGNOSIS — Z79899 Other long term (current) drug therapy: Secondary | ICD-10-CM | POA: Insufficient documentation

## 2011-11-11 DIAGNOSIS — K219 Gastro-esophageal reflux disease without esophagitis: Secondary | ICD-10-CM | POA: Insufficient documentation

## 2011-11-11 DIAGNOSIS — M545 Low back pain, unspecified: Secondary | ICD-10-CM | POA: Insufficient documentation

## 2011-11-11 DIAGNOSIS — M25519 Pain in unspecified shoulder: Secondary | ICD-10-CM | POA: Insufficient documentation

## 2011-11-11 DIAGNOSIS — R109 Unspecified abdominal pain: Secondary | ICD-10-CM | POA: Insufficient documentation

## 2011-11-11 DIAGNOSIS — Z9889 Other specified postprocedural states: Secondary | ICD-10-CM | POA: Insufficient documentation

## 2011-11-11 DIAGNOSIS — R079 Chest pain, unspecified: Secondary | ICD-10-CM | POA: Insufficient documentation

## 2011-11-11 DIAGNOSIS — Z7982 Long term (current) use of aspirin: Secondary | ICD-10-CM | POA: Insufficient documentation

## 2011-11-11 DIAGNOSIS — F17200 Nicotine dependence, unspecified, uncomplicated: Secondary | ICD-10-CM | POA: Insufficient documentation

## 2011-11-11 DIAGNOSIS — T07XXXA Unspecified multiple injuries, initial encounter: Secondary | ICD-10-CM

## 2011-11-11 MED ORDER — CARISOPRODOL 350 MG PO TABS: ORAL_TABLET | ORAL | Status: DC
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
## 2011-11-11 NOTE — ED Provider Notes (Signed)
Medical screening examination/treatment/procedure(s) were performed by non-physician practitioner and as supervising physician I was immediately available for consultation/collaboration.   Geoffery Lyons, MD 11/11/11 [phone]

## 2012-03-18 ENCOUNTER — Emergency Department (HOSPITAL_COMMUNITY)
Admission: EM | Admit: 2012-03-18 | Discharge: 2012-03-18 | Disposition: A | Discharge status: Home / Self Care | Payer: Self-pay | Attending: Emergency Medicine | Admitting: Emergency Medicine

## 2012-03-18 ENCOUNTER — Encounter (HOSPITAL_COMMUNITY): Payer: Self-pay | Admitting: *Deleted

## 2012-03-18 DIAGNOSIS — M549 Dorsalgia, unspecified: Secondary | ICD-10-CM | POA: Insufficient documentation

## 2012-03-18 MED ORDER — HYDROMORPHONE HCL PF 1 MG/ML IJ SOLN
1.0000 mg | Freq: Once | INTRAMUSCULAR | Status: AC
  Administered 2012-03-18: 1 mg via INTRAMUSCULAR
  Filled 2012-03-18: qty 1

## 2012-03-18 MED ORDER — ONDANSETRON HCL 4 MG PO TABS
4.0000 mg | ORAL_TABLET | Freq: Once | ORAL | Status: AC
  Administered 2012-03-18: 4 mg via ORAL
  Filled 2012-03-18: qty 1

## 2012-03-18 MED ORDER — DIAZEPAM 5 MG PO TABS
5.0000 mg | ORAL_TABLET | Freq: Once | ORAL | Status: AC
  Administered 2012-03-18: 5 mg via ORAL
  Filled 2012-03-18: qty 1

## 2012-03-18 MED ORDER — KETOROLAC TROMETHAMINE 60 MG/2ML IM SOLN
60.0000 mg | Freq: Once | INTRAMUSCULAR | Status: AC
  Administered 2012-03-18: 60 mg via INTRAMUSCULAR
  Filled 2012-03-18: qty 2

## 2012-03-18 MED ORDER — DEXAMETHASONE 4 MG PO TABS: ORAL_TABLET | ORAL | Status: AC

## 2012-03-18 MED ORDER — CARISOPRODOL 350 MG PO TABS: 350.0000 mg | ORAL_TABLET | Freq: Three times a day (TID) | ORAL | Status: AC

## 2012-03-18 NOTE — ED Notes (Signed)
Pt a/ox4. Resp even and unlabored. NAD at this time. D/ C instructions reviewed with pt. Pt verbalized understanding. Pt ambulated to d/c desk with steady gate.  

## 2012-03-18 NOTE — ED Notes (Signed)
Pt presents with chronic back pain, starting Thursday after twisting to throw building debris. Pt states pain radiates to left leg.

## 2012-03-18 NOTE — Discharge Instructions (Signed)
Please alternate heat and ice to the back.  Soma three times daily for spasm-pain. This medication may cause drowsiness, use with caution. Decadron daily with  Food. Please see the spine specialist  Dr Eduard Clos for additional evaluation and assistance with your pain.Back Pain, Adult Low back pain is very common. About 1 in 5 people have back pain.The cause of low back pain is rarely dangerous. The pain often gets better over time.About half of people with a sudden onset of back pain feel better in just 2 weeks. About 8 in 10 people feel better by 6 weeks.  CAUSES Some common causes of back pain include:  Strain of the muscles or ligaments supporting the spine.   Wear and tear (degeneration) of the spinal discs.   Arthritis.   Direct injury to the back.  DIAGNOSIS Most of the time, the direct cause of low back pain is not known.However, back pain can be treated effectively even when the exact cause of the pain is unknown.Answering your caregiver's questions about your overall health and symptoms is one of the most accurate ways to make sure the cause of your pain is not dangerous. If your caregiver needs more information, he or she may order lab work or imaging tests (X-rays or MRIs).However, even if imaging tests show changes in your back, this usually does not require surgery. HOME CARE INSTRUCTIONS For many people, back pain returns.Since low back pain is rarely dangerous, it is often a condition that people can learn to [facility] their own.   Remain active. It is stressful on the back to sit or stand in one place. Do not sit, drive, or stand in one place for more than 30 minutes at a time. Take short walks on level surfaces as soon as pain allows.Try to increase the length of time you walk each day.   Do not stay in bed.Resting more than 1 or 2 days can delay your recovery.   Do not avoid exercise or work.Your body is made to move.It is not dangerous to be active, even though your  back may hurt.Your back will likely heal faster if you return to being active before your pain is gone.   Pay attention to your body when you bend and lift. Many people have less discomfortwhen lifting if they bend their knees, keep the load close to their bodies,and avoid twisting. Often, the most comfortable positions are those that put less stress on your recovering back.   Find a comfortable position to sleep. Use a firm mattress and lie on your side with your knees slightly bent. If you lie on your back, put a pillow under your knees.   Only take over-the-counter or prescription medicines as directed by your caregiver. Over-the-counter medicines to reduce pain and inflammation are often the most helpful.Your caregiver may prescribe muscle relaxant drugs.These medicines help dull your pain so you can more quickly return to your normal activities and healthy exercise.   Put ice on the injured area.   Put ice in a plastic bag.   Place a towel between your skin and the bag.   Leave the ice on for 15 to 20 minutes, 3 to 4 times a day for the first 2 to 3 days. After that, ice and heat may be alternated to reduce pain and spasms.   Ask your caregiver about trying back exercises and gentle massage. This may be of some benefit.   Avoid feeling anxious or stressed.Stress increases muscle tension and can worsen  back pain.It is important to recognize when you are anxious or stressed and learn ways to manage it.Exercise is a great option.  SEEK MEDICAL CARE IF:  You have pain that is not relieved with rest or medicine.   You have pain that does not improve in 1 week.   You have new symptoms.   You are generally not feeling well.  SEEK IMMEDIATE MEDICAL CARE IF:   You have pain that radiates from your back into your legs.   You develop new bowel or bladder control problems.   You have unusual weakness or numbness in your arms or legs.   You develop nausea or vomiting.   You  develop abdominal pain.   You feel faint.  Document Released: 12/12/2005 Document Revised: 12/01/2011 Document Reviewed: 05/02/2011 [facility] Patient Information 2012 [place], [place].

## 2012-03-19 ENCOUNTER — Emergency Department (HOSPITAL_COMMUNITY)
Admission: EM | Admit: 2012-03-19 | Discharge: 2012-03-19 | Disposition: A | Discharge status: Home / Self Care | Payer: Self-pay | Attending: Emergency Medicine | Admitting: Emergency Medicine

## 2012-03-19 ENCOUNTER — Encounter (HOSPITAL_COMMUNITY): Payer: Self-pay | Admitting: *Deleted

## 2012-03-19 DIAGNOSIS — M545 Low back pain, unspecified: Secondary | ICD-10-CM | POA: Insufficient documentation

## 2012-03-19 DIAGNOSIS — F17200 Nicotine dependence, unspecified, uncomplicated: Secondary | ICD-10-CM | POA: Insufficient documentation

## 2012-03-19 DIAGNOSIS — K219 Gastro-esophageal reflux disease without esophagitis: Secondary | ICD-10-CM | POA: Insufficient documentation

## 2012-03-19 DIAGNOSIS — M549 Dorsalgia, unspecified: Secondary | ICD-10-CM

## 2012-03-19 DIAGNOSIS — R000 Tachycardia, unspecified: Secondary | ICD-10-CM | POA: Insufficient documentation

## 2012-03-19 MED ORDER — PROMETHAZINE HCL 12.5 MG PO TABS
25.0000 mg | ORAL_TABLET | Freq: Once | ORAL | Status: AC
  Administered 2012-03-19: 25 mg via ORAL
  Filled 2012-03-19: qty 2

## 2012-03-19 MED ORDER — DIAZEPAM 5 MG PO TABS
5.0000 mg | ORAL_TABLET | Freq: Once | ORAL | Status: AC
  Administered 2012-03-19: 5 mg via ORAL
  Filled 2012-03-19: qty 1

## 2012-03-19 MED ORDER — HYDROMORPHONE HCL PF 1 MG/ML IJ SOLN
1.0000 mg | Freq: Once | INTRAMUSCULAR | Status: AC
  Administered 2012-03-19: 1 mg via INTRAMUSCULAR
  Filled 2012-03-19: qty 1

## 2012-03-19 MED ORDER — HYDROCODONE-ACETAMINOPHEN 7.5-500 MG PO TABS: 1.0000 | ORAL_TABLET | ORAL | Status: AC | PRN

## 2012-03-19 NOTE — ED Provider Notes (Signed)
History     CSN: [id number]  Arrival date & time 03/19/12  1229   First MD Initiated Contact with Patient 03/19/12 1339      Chief Complaint  Patient presents with  . Back Pain    (Consider location/radiation/quality/duration/timing/severity/associated sxs/prior treatment) Patient is a 46 y.o. female presenting with back pain. The history is provided by the patient.  Back Pain  This is a chronic problem. Episode onset: Pain worse 2 days ago after tossing heavy objects. The problem occurs constantly. The problem has been gradually worsening. The pain is associated with twisting and lifting heavy objects. The pain is present in the lumbar spine. The quality of the pain is described as shooting, aching and burning. The pain is severe. The symptoms are aggravated by certain positions. The pain is the same all the time. Stiffness is present all day. Associated symptoms include leg pain and tingling. Pertinent negatives include no chest pain, no abdominal pain, no bowel incontinence, no perianal numbness, no bladder incontinence and no dysuria. She has tried muscle relaxants (steroids) for the symptoms.    Past Medical History  Diagnosis Date  . Pancreatitis   . Peptic ulcer   . GERD (gastroesophageal reflux disease)   . Hiatal hernia   . Chronic back pain     Past Surgical History  Procedure Date  . Cholecystectomy   . Cesarean section   . Tubial ligation     Family History  Problem Relation Age of Onset  . Asthma Mother   . Heart failure Mother   . Cancer Mother   . Diabetes Mother   . Hypertension Mother   . Stroke Mother   . Heart failure Father   . Diabetes Father     History  Substance Use Topics  . Smoking status: Current Everyday Smoker -- 0.5 packs/day for 20 years    Types: Cigarettes  . Smokeless tobacco: Never Used  . Alcohol Use: No    OB History    Grav Para Term Preterm Abortions TAB SAB Ect Mult Living   4 3 3  1  1   3       Review of Systems    Constitutional: Negative for activity change.       All ROS Neg except as noted in HPI  HENT: Negative for nosebleeds and neck pain.   Eyes: Negative for photophobia and discharge.  Respiratory: Negative for cough, shortness of breath and wheezing.   Cardiovascular: Negative for chest pain and palpitations.  Gastrointestinal: Negative for abdominal pain, blood in stool and bowel incontinence.  Genitourinary: Negative for bladder incontinence, dysuria, frequency and hematuria.  Musculoskeletal: Positive for back pain. Negative for arthralgias.  Skin: Negative.   Neurological: Positive for tingling. Negative for dizziness, seizures and speech difficulty.  Psychiatric/Behavioral: Negative for hallucinations and confusion.    Allergies  Ibuprofen and Vicodin  Home Medications   Current Outpatient Rx  Name Route Sig Dispense Refill  . ASPIRIN 325 MG PO TABS Oral Take 325 mg by mouth daily as needed.     Marland Kitchen CARISOPRODOL 350 MG PO TABS  1 po tid for pain and spasm 21 tablet 0  . CARISOPRODOL 350 MG PO TABS Oral Take 1 tablet (350 mg total) by mouth 3 (three) times daily. 21 tablet 0  . DEXAMETHASONE 4 MG PO TABS  1 po daily with food 6 tablet 0  . OMEPRAZOLE 20 MG PO CPDR Oral Take 20 mg by mouth 2 (two) times daily.  BP 138/98  Pulse 113  Temp(Src) 97.7 F (36.5 C) (Oral)  Resp 20  Ht 5\' 7"  (1.702 m)  Wt 170 lb (77.111 kg)  BMI 26.63 kg/m2  SpO2 99%  LMP 11/11/2011  Physical Exam  Nursing note and vitals reviewed. Constitutional: She is oriented to person, place, and time. She appears well-developed and well-nourished.  Non-toxic appearance.  HENT:  Head: Normocephalic.  Right Ear: Tympanic membrane and external ear normal.  Left Ear: Tympanic membrane and external ear normal.  Eyes: EOM and lids are normal. Pupils are equal, round, and reactive to light.  Neck: Normal range of motion. Neck supple. Carotid bruit is not present.  Cardiovascular: Regular rhythm, normal  heart sounds, intact distal pulses and normal pulses.  Tachycardia present.   Pulmonary/Chest: Breath sounds normal. No respiratory distress.  Abdominal: Soft. Bowel sounds are normal. There is no tenderness. There is no guarding.  Musculoskeletal: Normal range of motion.       Lower lumbar pain an spasm with movement and palpation.  Lymphadenopathy:       Head (right side): No submandibular adenopathy present.       Head (left side): No submandibular adenopathy present.    She has no cervical adenopathy.  Neurological: She is alert and oriented to person, place, and time. She has normal strength. No cranial nerve deficit or sensory deficit. She exhibits normal muscle tone. Coordination normal.  Skin: Skin is warm and dry.  Psychiatric: She has a normal mood and affect. Her speech is normal.    ED Course  Procedures (including critical care time)  Labs Reviewed - No data to display No results found.   No diagnosis found.    MDM  I have reviewed nursing notes, vital signs, and all appropriate lab and imaging results for this patient.* Pt states she is not sure why vicodin was listed as allergy. She denies having an allergic reaction to this medication. Pt states her back pain is getting worse. No loss of bowel or bladder function, just more pain. Will add vicodin and benadryl to current medications.       Kathie Dike, [place] 03/20/12 2123

## 2012-03-19 NOTE — ED Notes (Signed)
Pt states that she injured her back Thursday when she was tossing something heavy, was seen in er yesterday, states that she has not gotten any better.

## 2012-03-19 NOTE — Discharge Instructions (Signed)
Please rest your back. Alternate heat and ice. Continue your dexamethasone and Soma. Add Lortab every 4 hours if needed. Please use a benadryl tablet 2 times daily to prevent itching. Please check with the [facility] to possibly obtain a primary MD for assistance with this condition.Back Pain, Adult Low back pain is very common. About 1 in 5 people have back pain.The cause of low back pain is rarely dangerous. The pain often gets better over time.About half of people with a sudden onset of back pain feel better in just 2 weeks. About 8 in 10 people feel better by 6 weeks.  CAUSES Some common causes of back pain include:  Strain of the muscles or ligaments supporting the spine.   Wear and tear (degeneration) of the spinal discs.   Arthritis.   Direct injury to the back.  DIAGNOSIS Most of the time, the direct cause of low back pain is not known.However, back pain can be treated effectively even when the exact cause of the pain is unknown.Answering your caregiver's questions about your overall health and symptoms is one of the most accurate ways to make sure the cause of your pain is not dangerous. If your caregiver needs more information, he or she may order lab work or imaging tests (X-rays or MRIs).However, even if imaging tests show changes in your back, this usually does not require surgery. HOME CARE INSTRUCTIONS For many people, back pain returns.Since low back pain is rarely dangerous, it is often a condition that people can learn to [facility] their own.   Remain active. It is stressful on the back to sit or stand in one place. Do not sit, drive, or stand in one place for more than 30 minutes at a time. Take short walks on level surfaces as soon as pain allows.Try to increase the length of time you walk each day.   Do not stay in bed.Resting more than 1 or 2 days can delay your recovery.   Do not avoid exercise or work.Your body is made to move.It is not  dangerous to be active, even though your back may hurt.Your back will likely heal faster if you return to being active before your pain is gone.   Pay attention to your body when you bend and lift. Many people have less discomfortwhen lifting if they bend their knees, keep the load close to their bodies,and avoid twisting. Often, the most comfortable positions are those that put less stress on your recovering back.   Find a comfortable position to sleep. Use a firm mattress and lie on your side with your knees slightly bent. If you lie on your back, put a pillow under your knees.   Only take over-the-counter or prescription medicines as directed by your caregiver. Over-the-counter medicines to reduce pain and inflammation are often the most helpful.Your caregiver may prescribe muscle relaxant drugs.These medicines help dull your pain so you can more quickly return to your normal activities and healthy exercise.   Put ice on the injured area.   Put ice in a plastic bag.   Place a towel between your skin and the bag.   Leave the ice on for 15 to 20 minutes, 3 to 4 times a day for the first 2 to 3 days. After that, ice and heat may be alternated to reduce pain and spasms.   Ask your caregiver about trying back exercises and gentle massage. This may be of some benefit.   Avoid feeling anxious or  stressed.Stress increases muscle tension and can worsen back pain.It is important to recognize when you are anxious or stressed and learn ways to manage it.Exercise is a great option.  SEEK MEDICAL CARE IF:  You have pain that is not relieved with rest or medicine.   You have pain that does not improve in 1 week.   You have new symptoms.   You are generally not feeling well.  SEEK IMMEDIATE MEDICAL CARE IF:   You have pain that radiates from your back into your legs.   You develop new bowel or bladder control problems.   You have unusual weakness or numbness in your arms or legs.    You develop nausea or vomiting.   You develop abdominal pain.   You feel faint.  Document Released: 12/12/2005 Document Revised: 12/01/2011 Document Reviewed: 05/02/2011 [facility] Patient Information 2012 [place], [place].

## 2012-03-21 NOTE — ED Provider Notes (Signed)
Medical screening examination/treatment/procedure(s) were performed by non-physician practitioner and as supervising physician I was immediately available for consultation/collaboration.   Laray Anger, DO 03/21/12 [phone]

## 2012-03-31 IMAGING — CR DG ABDOMEN ACUTE W/ 1V CHEST
3 series · 3 of 3 positions shown · non-contrast
Comparison: 08/28/2011

CLINICAL DATA: Pancreatitis, abdominal pain

ACUTE ABDOMEN SERIES (ABDOMEN 2 VIEW & CHEST 1 VIEW)

[view not recorded (1 of 3)]
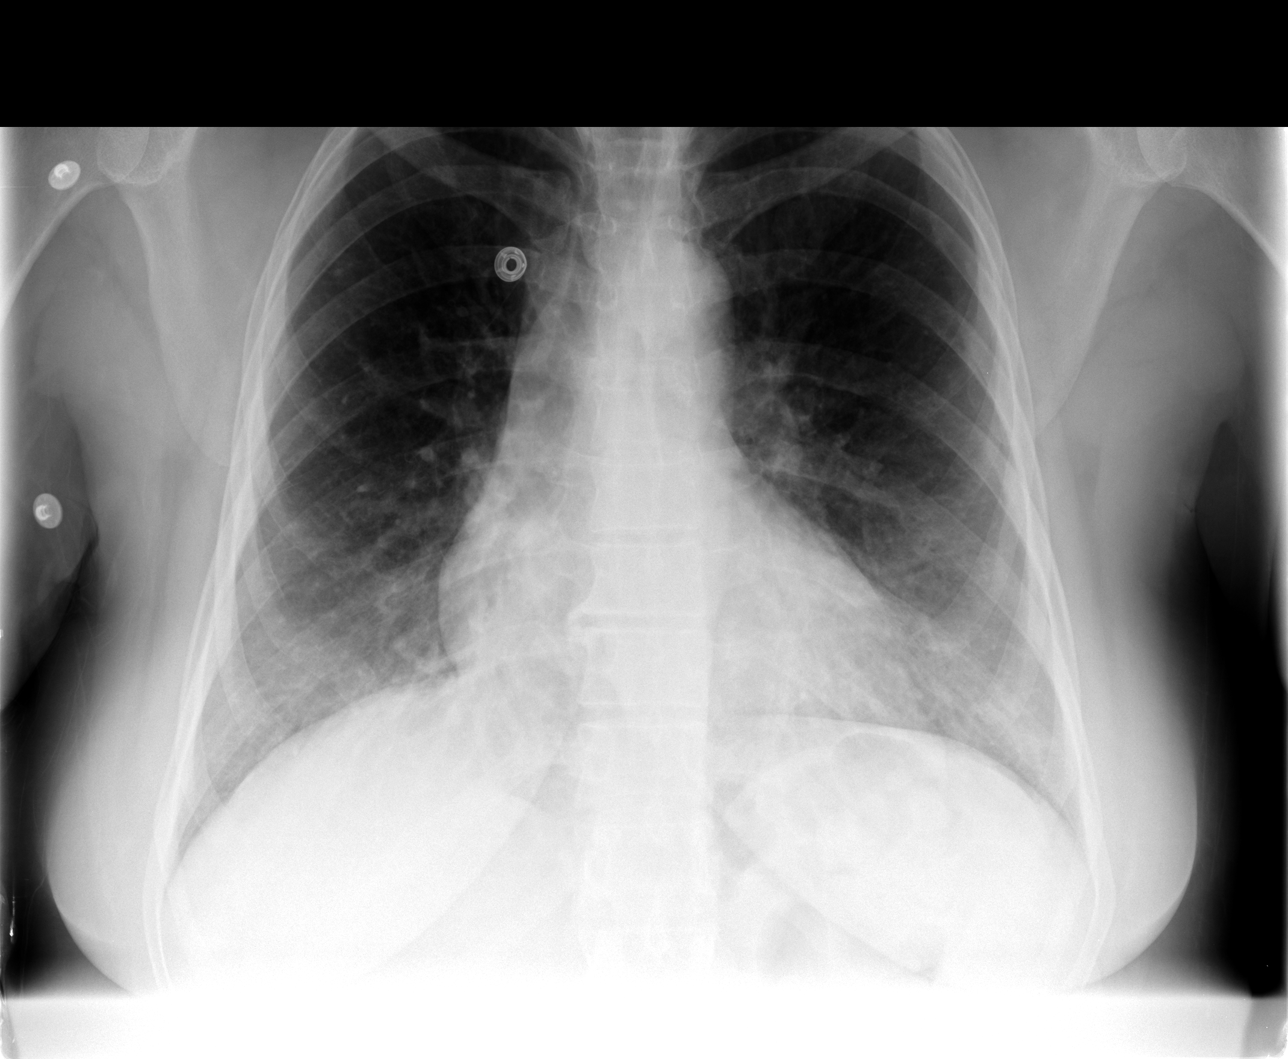

[view not recorded (2 of 3)]
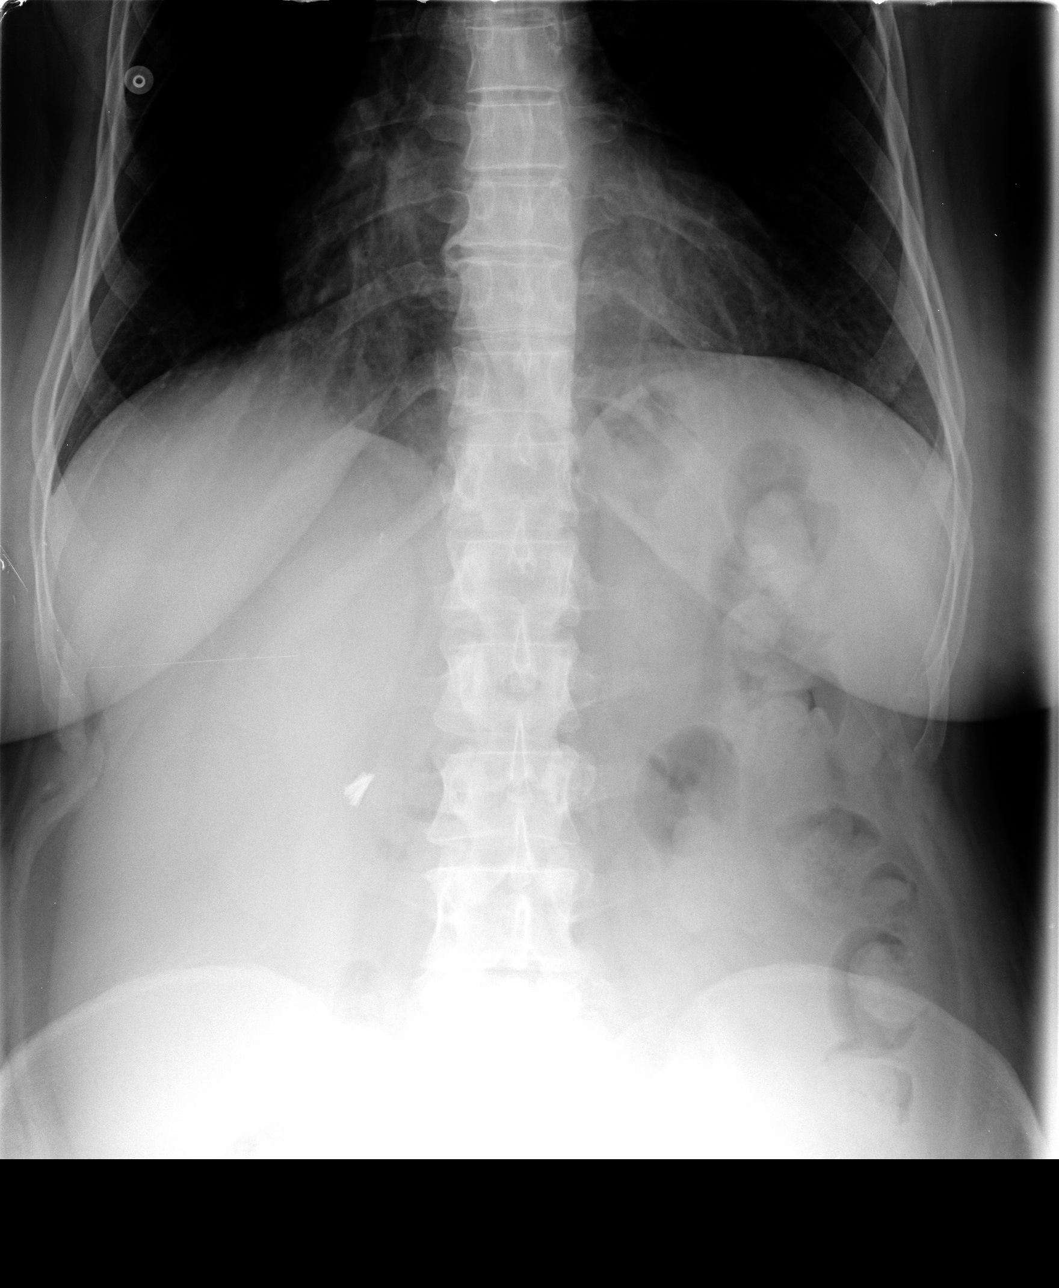

[view not recorded (3 of 3)]
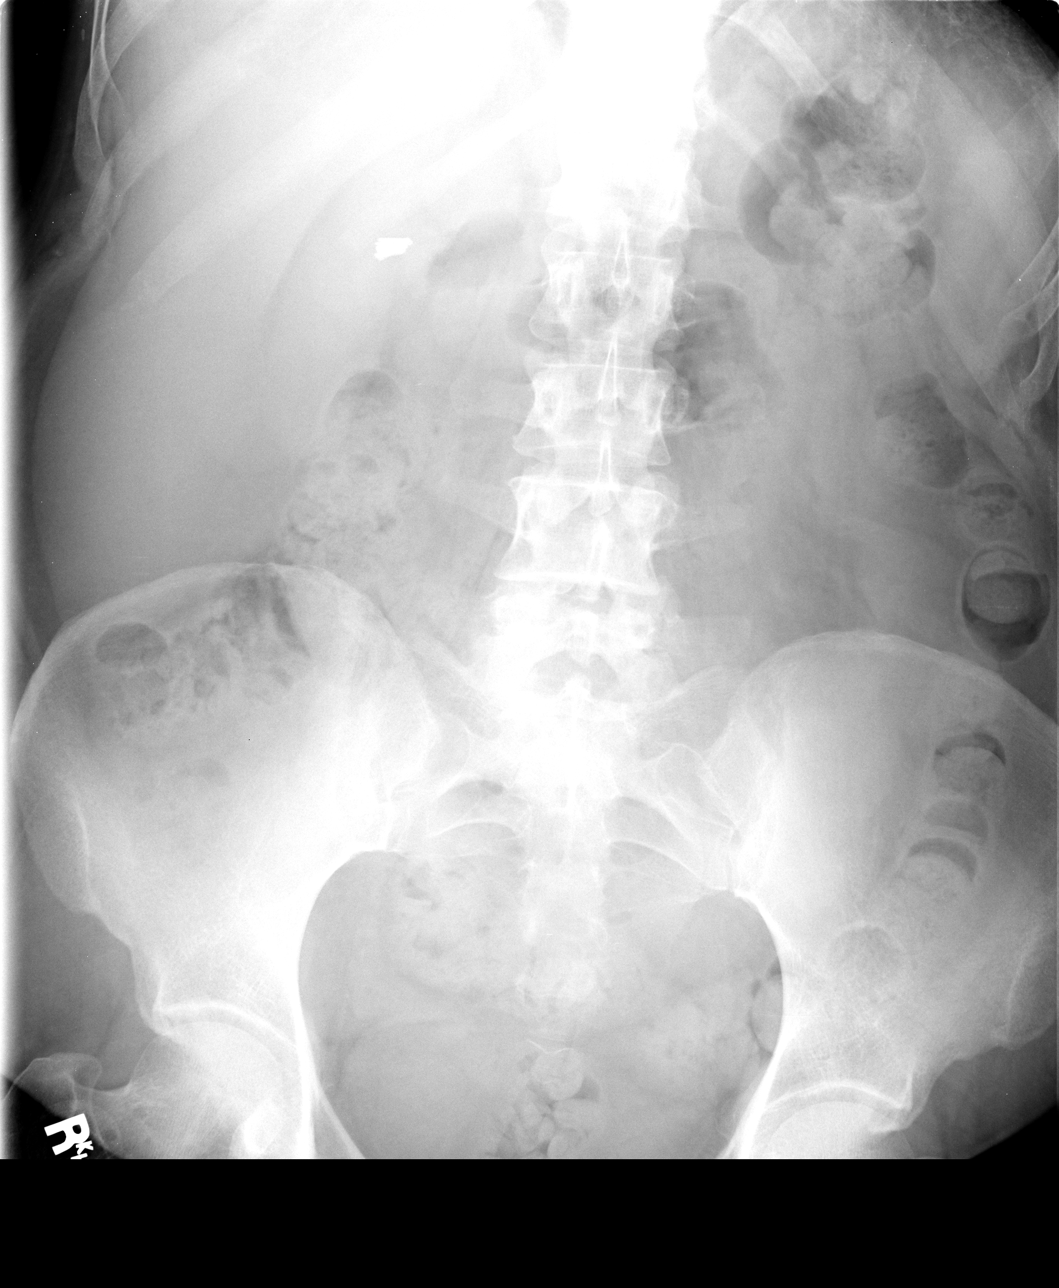

[3 of 3 positions shown; findings below may reference images not displayed]

FINDINGS: Heart size upper limits normal.  Patchy atelectasis or
early infiltrates in the lung bases as before.  No effusion.  No
free air.  Vascular clips in the right upper abdomen.  Small bowel
decompressed.  Normal distribution of gas and stool throughout the
colon.  Regional bones unremarkable.
IMPRESSION: 1.  Normal bowel gas pattern.
2.  No free air.
3.  Persistent bibasilar atelectasis or infiltrates.

## 2012-04-27 NOTE — ED Provider Notes (Signed)
History     CSN: [id number]  Arrival date & time 03/18/12  1055   First MD Initiated Contact with Patient 03/18/12 1204      Chief Complaint  Patient presents with  . Back Pain    (Consider location/radiation/quality/duration/timing/severity/associated sxs/prior treatment) Patient is a 46 y.o. female presenting with back pain. The history is provided by the patient.  Back Pain  This is a new problem. The current episode started more than 2 days ago. The problem occurs constantly. The problem has not changed since onset.The pain is associated with twisting and lifting heavy objects. The pain is present in the lumbar spine. The quality of the pain is described as aching. The pain radiates to the left thigh. The pain is severe. The symptoms are aggravated by certain positions. The pain is the same all the time. Stiffness is present all day. Pertinent negatives include no chest pain, no numbness, no abdominal pain, no bowel incontinence, no perianal numbness, no bladder incontinence, no dysuria and no paresthesias. She has tried bed rest for the symptoms. The treatment provided no relief.    Past Medical History  Diagnosis Date  . Pancreatitis   . Peptic ulcer   . GERD (gastroesophageal reflux disease)   . Hiatal hernia   . Chronic back pain     Past Surgical History  Procedure Date  . Cholecystectomy   . Cesarean section   . Tubial ligation     Family History  Problem Relation Age of Onset  . Asthma Mother   . Heart failure Mother   . Cancer Mother   . Diabetes Mother   . Hypertension Mother   . Stroke Mother   . Heart failure Father   . Diabetes Father     History  Substance Use Topics  . Smoking status: Current Everyday Smoker -- 0.5 packs/day for 20 years    Types: Cigarettes  . Smokeless tobacco: Never Used  . Alcohol Use: No    OB History    Grav Para Term Preterm Abortions TAB SAB Ect Mult Living   4 3 3  1  1   3       Review of Systems    Constitutional: Negative for activity change.       All ROS Neg except as noted in HPI  HENT: Negative for nosebleeds and neck pain.   Eyes: Negative for photophobia and discharge.  Respiratory: Negative for cough, shortness of breath and wheezing.   Cardiovascular: Negative for chest pain and palpitations.  Gastrointestinal: Negative for abdominal pain, blood in stool and bowel incontinence.  Genitourinary: Negative for bladder incontinence, dysuria, frequency and hematuria.  Musculoskeletal: Positive for back pain. Negative for arthralgias.  Skin: Negative.   Neurological: Negative for dizziness, seizures, speech difficulty, numbness and paresthesias.  Psychiatric/Behavioral: Negative for hallucinations and confusion.    Allergies  Ibuprofen and Vicodin  Home Medications   Current Outpatient Rx  Name Route Sig Dispense Refill  . ASPIRIN 325 MG PO TABS Oral Take 325 mg by mouth daily as needed.     Marland Kitchen CARISOPRODOL 350 MG PO TABS  1 po tid for pain and spasm 21 tablet 0  . OMEPRAZOLE 20 MG PO CPDR Oral Take 20 mg by mouth 2 (two) times daily.       BP 107/61  Pulse 97  Temp(Src) 98.3 F (36.8 C) (Oral)  Resp 20  Ht 5\' 7"  (1.702 m)  Wt 170 lb (77.111 kg)  BMI 26.63 kg/m2  SpO2  95%  LMP 11/11/2011  Physical Exam  Nursing note and vitals reviewed. Constitutional: She is oriented to person, place, and time. She appears well-developed and well-nourished.  Non-toxic appearance.  HENT:  Head: Normocephalic.  Right Ear: Tympanic membrane and external ear normal.  Left Ear: Tympanic membrane and external ear normal.  Eyes: EOM and lids are normal. Pupils are equal, round, and reactive to light.  Neck: Normal range of motion. Neck supple. Carotid bruit is not present.  Cardiovascular: Normal rate, regular rhythm, normal heart sounds, intact distal pulses and normal pulses.   Pulmonary/Chest: Breath sounds normal. No respiratory distress.  Abdominal: Soft. Bowel sounds are  normal. There is no tenderness. There is no guarding.  Musculoskeletal:       Pain to palpation of the lumbar area. Pain with attempted ROM. No palpable deformity.  Lymphadenopathy:       Head (right side): No submandibular adenopathy present.       Head (left side): No submandibular adenopathy present.    She has no cervical adenopathy.  Neurological: She is alert and oriented to person, place, and time. She has normal strength. No cranial nerve deficit or sensory deficit.       Gait wnl.  Skin: Skin is warm and dry.  Psychiatric: She has a normal mood and affect. Her speech is normal.    ED Course  Procedures (including critical care time)  Labs Reviewed - No data to display No results found.   1. Back pain       MDM  I have reviewed nursing notes, vital signs, and all appropriate lab and imaging results for this patient.* Pt treated with IM toradol and dilaudid. Pt ambulatory at d/c. No gross neuro deficits noted. Pt to see her pcp for follow up and recheck.       Kathie Dike, [place] 04/27/12 [phone]

## 2012-04-28 NOTE — ED Provider Notes (Signed)
Medical screening examination/treatment/procedure(s) were performed by non-physician practitioner and as supervising physician I was immediately available for consultation/collaboration.   Joya Gaskins, MD 04/28/12 [phone]

## 2012-05-01 ENCOUNTER — Encounter (HOSPITAL_COMMUNITY): Payer: Self-pay

## 2012-05-01 ENCOUNTER — Emergency Department (HOSPITAL_COMMUNITY)
Admission: EM | Admit: 2012-05-01 | Discharge: 2012-05-01 | Disposition: A | Discharge status: Home / Self Care | Payer: Self-pay | Attending: Emergency Medicine | Admitting: Emergency Medicine

## 2012-05-01 DIAGNOSIS — Z8711 Personal history of peptic ulcer disease: Secondary | ICD-10-CM | POA: Insufficient documentation

## 2012-05-01 DIAGNOSIS — R112 Nausea with vomiting, unspecified: Secondary | ICD-10-CM | POA: Insufficient documentation

## 2012-05-01 DIAGNOSIS — R079 Chest pain, unspecified: Secondary | ICD-10-CM | POA: Insufficient documentation

## 2012-05-01 DIAGNOSIS — K219 Gastro-esophageal reflux disease without esophagitis: Secondary | ICD-10-CM | POA: Insufficient documentation

## 2012-05-01 DIAGNOSIS — K859 Acute pancreatitis without necrosis or infection, unspecified: Secondary | ICD-10-CM

## 2012-05-01 DIAGNOSIS — F17200 Nicotine dependence, unspecified, uncomplicated: Secondary | ICD-10-CM | POA: Insufficient documentation

## 2012-05-01 DIAGNOSIS — R109 Unspecified abdominal pain: Secondary | ICD-10-CM | POA: Insufficient documentation

## 2012-05-01 LAB — URINALYSIS, ROUTINE W REFLEX MICROSCOPIC
Ketones, ur: NEGATIVE mg/dL
Leukocytes, UA: NEGATIVE
Nitrite: NEGATIVE
Protein, ur: NEGATIVE mg/dL
Urobilinogen, UA: 0.2 mg/dL (ref 0.0–1.0)
pH: 6.5 (ref 5.0–8.0)

## 2012-05-01 LAB — DIFFERENTIAL
Basophils Relative: 0 % (ref 0–1)
Monocytes Absolute: 0.6 10*3/uL (ref 0.1–1.0)
Monocytes Relative: 8 % (ref 3–12)
Neutro Abs: 4.6 10*3/uL (ref 1.7–7.7)

## 2012-05-01 LAB — COMPREHENSIVE METABOLIC PANEL
Albumin: 3.4 g/dL — ABNORMAL LOW (ref 3.5–5.2)
BUN: 6 mg/dL (ref 6–23)
Chloride: 106 mEq/L (ref 96–112)
Creatinine, Ser: 0.81 mg/dL (ref 0.50–1.10)
GFR calc Af Amer: >90 mL/min (ref >90)
GFR calc non Af Amer: 86 mL/min — ABNORMAL LOW (ref >90)
Glucose, Bld: 107 mg/dL — ABNORMAL HIGH (ref 70–99)
Total Bilirubin: 0.2 mg/dL — ABNORMAL LOW (ref 0.3–1.2)

## 2012-05-01 LAB — LIPASE, BLOOD: Lipase: 112 U/L — ABNORMAL HIGH (ref 11–59)

## 2012-05-01 LAB — CBC
HCT: 36.6 % (ref 36.0–46.0)
Hemoglobin: 12.5 g/dL (ref 12.0–15.0)
MCH: 30.2 pg (ref 26.0–34.0)
MCHC: 34.2 g/dL (ref 30.0–36.0)

## 2012-05-01 MED ORDER — ONDANSETRON 8 MG PO TBDP
8.0000 mg | ORAL_TABLET | Freq: Once | ORAL | Status: AC
  Administered 2012-05-01: 8 mg via ORAL
  Filled 2012-05-01: qty 1

## 2012-05-01 MED ORDER — OXYCODONE-ACETAMINOPHEN 5-325 MG PO TABS: 1.0000 | ORAL_TABLET | ORAL | Status: AC | PRN

## 2012-05-01 MED ORDER — OXYCODONE-ACETAMINOPHEN 5-325 MG PO TABS
2.0000 | ORAL_TABLET | Freq: Once | ORAL | Status: AC
  Administered 2012-05-01: 2 via ORAL
  Filled 2012-05-01: qty 2

## 2012-05-01 MED ORDER — HYDROMORPHONE HCL PF 1 MG/ML IJ SOLN
1.0000 mg | Freq: Once | INTRAMUSCULAR | Status: AC
  Administered 2012-05-01: 1 mg via INTRAMUSCULAR
  Filled 2012-05-01: qty 1

## 2012-05-01 NOTE — ED Provider Notes (Signed)
History   This chart was scribed for Joya Gaskins, MD by Clarita Crane. The patient was seen in room APA11/APA11. Patient's care was started at 1146.    CSN: [id number]  Arrival date & time 05/01/12  1146   First MD Initiated Contact with Patient 05/01/12 1202      Chief Complaint  Patient presents with  . Abdominal Pain    HPI Rita [name] is a 46 y.o. female who presents to the Emergency Department complaining of waxing and waning moderate to severe upper abdominal pain described as sharp which intermittently radiates to chest with associated abdominal swelling onset 3 days ago and gradually worsening since with associated nausea and vomiting. Denies diarrhea, constipation, melena, hematochezia, hematemesis, vaginal bleeding, vaginal discharge, dysuria. Patient with h/o pancreatitis, peptic ulcer, GERD, hiatal hernia, chronic back pain, cholecystectomy, c-section, tubal ligation.   Past Medical History  Diagnosis Date  . Pancreatitis   . Peptic ulcer   . GERD (gastroesophageal reflux disease)   . Hiatal hernia   . Chronic back pain     Past Surgical History  Procedure Date  . Cholecystectomy   . Cesarean section   . Tubial ligation     Family History  Problem Relation Age of Onset  . Asthma Mother   . Heart failure Mother   . Cancer Mother   . Diabetes Mother   . Hypertension Mother   . Stroke Mother   . Heart failure Father   . Diabetes Father     History  Substance Use Topics  . Smoking status: Current Everyday Smoker -- 0.5 packs/day for 20 years    Types: Cigarettes  . Smokeless tobacco: Never Used  . Alcohol Use: No    OB History    Grav Para Term Preterm Abortions TAB SAB Ect Mult Living   4 3 3  1  1   3       Review of Systems A complete 10 system review of systems was obtained and all systems are negative except as noted in the HPI and PMH.   Allergies  Ibuprofen and Vicodin  Home Medications   Current Outpatient Rx  Name Route Sig  Dispense Refill  . ASPIRIN 325 MG PO TABS Oral Take 325 mg by mouth daily as needed.     Marland Kitchen CARISOPRODOL 350 MG PO TABS  1 po tid for pain and spasm 21 tablet 0  . OMEPRAZOLE 20 MG PO CPDR Oral Take 20 mg by mouth 2 (two) times daily.       BP 151/72  Pulse 71  Temp(Src) 97.8 F (36.6 C) (Oral)  Resp 20  Ht 5\' 7"  (1.702 m)  Wt 170 lb (77.111 kg)  BMI 26.63 kg/m2  SpO2 100%  LMP 02/09/2012  Physical Exam CONSTITUTIONAL: Well developed/well nourished HEAD AND FACE: Normocephalic/atraumatic EYES: EOMI/PERRL, no scleral icterus ENMT: Mucous membranes moist NECK: supple no meningeal signs SPINE:entire spine nontender CV: S1/S2 noted, no murmurs/rubs/gallops noted LUNGS: Lungs are clear to auscultation bilaterally, no apparent distress ABDOMEN: soft, no rebound or guarding, epigastric tenderness, tenderness is moderate GU:no cva tenderness NEURO: Pt is awake/alert, moves all extremitiesx4 EXTREMITIES: pulses normal, full ROM SKIN: warm, color normal PSYCH: no abnormalities of mood noted  ED Course  Procedures   DIAGNOSTIC STUDIES: Oxygen Saturation is 100% on room air, normal by my interpretation.    COORDINATION OF CARE: 12:20PM-Patient informed of current plan for treatment and evaluation and agrees with plan at this time.  1:41PM- Patient reports  that pain persists at this time although improved.  3:21 PM Pt tolerating fluids and feels improved Suspect mild pancreatitis.  Discussed strict return precautions Pt agreeable Doubt acute abd process at this time   The patient appears reasonably screened and/or stabilized for discharge and I doubt any other medical condition or other [facility] requiring further screening, evaluation, or treatment in the ED at this time prior to discharge.    Results for orders placed during the hospital encounter of 05/01/12  CBC      Component Value Range   WBC 7.6  4.0 - 10.5 (K/uL)   RBC 4.14  3.87 - 5.11 (MIL/uL)   Hemoglobin 12.5  12.0 -  15.0 (g/dL)   HCT 95.6  21.3 - 08.6 (%)   MCV 88.4  78.0 - 100.0 (fL)   MCH 30.2  26.0 - 34.0 (pg)   MCHC 34.2  30.0 - 36.0 (g/dL)   RDW 57.8  46.9 - 62.9 (%)   Platelets 349  150 - 400 (K/uL)  DIFFERENTIAL      Component Value Range   Neutrophils Relative 61  43 - 77 (%)   Neutro Abs 4.6  1.7 - 7.7 (K/uL)   Lymphocytes Relative 30  12 - 46 (%)   Lymphs Abs 2.3  0.7 - 4.0 (K/uL)   Monocytes Relative 8  3 - 12 (%)   Monocytes Absolute 0.6  0.1 - 1.0 (K/uL)   Eosinophils Relative 1  0 - 5 (%)   Eosinophils Absolute 0.1  0.0 - 0.7 (K/uL)   Basophils Relative 0  0 - 1 (%)   Basophils Absolute 0.0  0.0 - 0.1 (K/uL)  COMPREHENSIVE METABOLIC PANEL      Component Value Range   Sodium 141  135 - 145 (mEq/L)   Potassium 3.5  3.5 - 5.1 (mEq/L)   Chloride 106  96 - 112 (mEq/L)   CO2 26  19 - 32 (mEq/L)   Glucose, Bld 107 (*) 70 - 99 (mg/dL)   BUN 6  6 - 23 (mg/dL)   Creatinine, Ser 5.28  0.50 - 1.10 (mg/dL)   Calcium 9.3  8.4 - 41.3 (mg/dL)   Total Protein 6.7  6.0 - 8.3 (g/dL)   Albumin 3.4 (*) 3.5 - 5.2 (g/dL)   AST 12  0 - 37 (U/L)   ALT 11  0 - 35 (U/L)   Alkaline Phosphatase 55  39 - 117 (U/L)   Total Bilirubin 0.2 (*) 0.3 - 1.2 (mg/dL)   GFR calc non Af Amer 86 (*) >90 (mL/min)   GFR calc Af Amer >90  >90 (mL/min)  LIPASE, BLOOD      Component Value Range   Lipase 112 (*) 11 - 59 (U/L)       MDM  Nursing notes reviewed and considered in documentation Previous records reviewed and considered All labs/vitals reviewed and considered     Date: 05/01/2012  Rate: 65  Rhythm: normal sinus rhythm  QRS Axis: normal  Intervals: normal  ST/T Wave abnormalities: normal  Conduction Disutrbances:none  Narrative Interpretation:   Old EKG Reviewed: unchanged     I personally performed the services described in this documentation, which was scribed in my presence. The recorded information has been reviewed and considered.      Joya Gaskins, MD 05/01/12 [phone]

## 2012-05-01 NOTE — ED Notes (Signed)
Pt c/o upper abd pain with n/v x 3 days.  Reports history of pancreatitis.

## 2012-05-01 NOTE — Discharge Instructions (Signed)
Acute Pancreatitis Acute pancreatitis is the sudden irritation (inflammation) of the pancreas. The pancreas produces digestive juices or enzymes that break down food. The pancreas also makes 2 hormones that control your blood sugar. If the pancreas is irritated, the enzymes attack internal structures and tissues become damaged. When this happens, the pancreas fails to make new enzymes that break down food. HOME CARE  Eat small meals often. Avoid fatty, greasy, and fried foods.   Follow diet instructions given to you by your doctor.   Avoid foods or drinks that may have started the problem (like alcohol).   Drink enough fluids to keep your pee (urine) clear or pale yellow.   Only take medicine as told by your doctor.   Rest often.   Check your blood sugar at home if you are told to.   Keep all your follow-up visits. This is very important.  GET HELP RIGHT AWAY IF:   You do not get better in the time your doctor said you would.   You have pain, weakness, or feel sick to your stomach (nauseous).   You recover but then have another episode of pain.   You are unable to eat or keep liquids down.   Your pain gets worse or changes.   You have a temperature by mouth above 102 F (38.9 C), not controlled by medicine.   Your skin or the white part of your eyes look yellow.   You start to throw up (vomit).   You feel dizzy or pass out (faint).   Your blood sugar is high (over 300).  MAKE SURE YOU:   Understand these instructions.   Will watch your condition.   Will get help right away if you are not doing well or get worse.  Document Released: 05/30/2008 Document Revised: 12/01/2011 Document Reviewed: 05/30/2008 [employer] Patient Information 2012 [employer]. 

## 2012-05-31 ENCOUNTER — Emergency Department (HOSPITAL_COMMUNITY)
Admission: EM | Admit: 2012-05-31 | Discharge: 2012-05-31 | Disposition: A | Discharge status: Home / Self Care | Payer: Self-pay | Attending: Emergency Medicine | Admitting: Emergency Medicine

## 2012-05-31 ENCOUNTER — Encounter (HOSPITAL_COMMUNITY): Payer: Self-pay | Admitting: *Deleted

## 2012-05-31 DIAGNOSIS — G8929 Other chronic pain: Secondary | ICD-10-CM | POA: Insufficient documentation

## 2012-05-31 DIAGNOSIS — L309 Dermatitis, unspecified: Secondary | ICD-10-CM

## 2012-05-31 DIAGNOSIS — F17200 Nicotine dependence, unspecified, uncomplicated: Secondary | ICD-10-CM | POA: Insufficient documentation

## 2012-05-31 DIAGNOSIS — K219 Gastro-esophageal reflux disease without esophagitis: Secondary | ICD-10-CM | POA: Insufficient documentation

## 2012-05-31 DIAGNOSIS — L559 Sunburn, unspecified: Secondary | ICD-10-CM | POA: Insufficient documentation

## 2012-05-31 DIAGNOSIS — W899XXA Exposure to unspecified man-made visible and ultraviolet light, initial encounter: Secondary | ICD-10-CM | POA: Insufficient documentation

## 2012-05-31 DIAGNOSIS — R21 Rash and other nonspecific skin eruption: Secondary | ICD-10-CM | POA: Insufficient documentation

## 2012-05-31 MED ORDER — CEPHALEXIN 250 MG PO CAPS: 250.0000 mg | ORAL_CAPSULE | Freq: Four times a day (QID) | ORAL | Status: AC

## 2012-05-31 MED ORDER — PREDNISONE 20 MG PO TABS
40.0000 mg | ORAL_TABLET | Freq: Once | ORAL | Status: AC
  Administered 2012-05-31: 40 mg via ORAL
  Filled 2012-05-31: qty 2

## 2012-05-31 MED ORDER — HYDROCODONE-ACETAMINOPHEN 5-325 MG PO TABS
1.0000 | ORAL_TABLET | Freq: Once | ORAL | Status: AC
  Administered 2012-05-31: 1 via ORAL
  Filled 2012-05-31: qty 1

## 2012-05-31 MED ORDER — PREDNISONE 10 MG PO TABS: ORAL_TABLET | ORAL | Status: DC

## 2012-05-31 MED ORDER — HYDROCODONE-ACETAMINOPHEN 5-325 MG PO TABS: ORAL_TABLET | ORAL | Status: AC

## 2012-05-31 NOTE — ED Notes (Signed)
Pt alert & oriented x4, stable gait. Pt given discharge instructions, paperwork & prescription(s). Patient instructed to stop at the registration desk to finish any additional paperwork. pt verbalized understanding. Pt left department w/ no further questions.  

## 2012-05-31 NOTE — ED Provider Notes (Signed)
History     CSN: [id number]  Arrival date & time 05/31/12  2014   First MD Initiated Contact with Patient 05/31/12 2025      Chief Complaint  Patient presents with  . Rash    (Consider location/radiation/quality/duration/timing/severity/associated sxs/prior treatment) HPI Comments: Patient reports having a sunburn to her upper back and across her shoulders that occurred 1 week ago. States the area " blistered and peeled".  States that her boyfriend has been applying baby oil and baby lotion to the area. She states the skin has been peeling. Today she noticed a red rash to the same area. She describes the rash as slightly itching and tingling. She denies fever, chills, neck pain, or radiation of tingling into her arms.  Patient is a 46 y.o. female presenting with rash. The history is provided by the patient.  Rash  This is a new problem. Episode onset: Today. The problem has not changed since onset.Associated with: Recent sunburn. There has been no fever. Affected Location: Upper back and shoulders. The pain is mild. The pain has been constant since onset. Associated symptoms include itching. Associated symptoms comments: Tingling. Treatments tried: Baby lotion and baby oil. The treatment provided no relief.    Past Medical History  Diagnosis Date  . Pancreatitis   . Peptic ulcer   . GERD (gastroesophageal reflux disease)   . Hiatal hernia   . Chronic back pain     Past Surgical History  Procedure Date  . Cholecystectomy   . Cesarean section   . Tubial ligation     Family History  Problem Relation Age of Onset  . Asthma Mother   . Heart failure Mother   . Cancer Mother   . Diabetes Mother   . Hypertension Mother   . Stroke Mother   . Heart failure Father   . Diabetes Father     History  Substance Use Topics  . Smoking status: Current Everyday Smoker -- 0.5 packs/day for 20 years    Types: Cigarettes  . Smokeless tobacco: Never Used  . Alcohol Use: No    OB  History    Grav Para Term Preterm Abortions TAB SAB Ect Mult Living   4 3 3  1  1   3       Review of Systems  Constitutional: Negative for fever and chills.  HENT: Negative for facial swelling, trouble swallowing, neck pain and neck stiffness.   Eyes: Negative for visual disturbance.  Respiratory: Negative for shortness of breath.   Cardiovascular: Negative for chest pain.  Musculoskeletal: Negative for joint swelling.  Skin: Positive for color change, itching and rash.  Neurological: Negative for dizziness, weakness, numbness and headaches.  All other systems reviewed and are negative.    Allergies  Review of patient's allergies indicates no known allergies.  Home Medications   Current Outpatient Rx  Name Route Sig Dispense Refill  . ASPIRIN 325 MG PO TABS Oral Take 325 mg by mouth daily as needed. For pain    . IBUPROFEN 200 MG PO TABS Oral Take 800 mg by mouth daily as needed. For pain    . OMEPRAZOLE 20 MG PO CPDR Oral Take 20 mg by mouth 2 (two) times daily.       BP 166/92  Pulse 77  Temp(Src) 98 F (36.7 C) (Oral)  Resp 18  SpO2 100%  LMP 02/09/2012  Physical Exam  Nursing note and vitals reviewed. Constitutional: She appears well-developed and well-nourished. No distress.  HENT:  Head: Normocephalic and atraumatic.  Mouth/Throat: Oropharynx is clear and moist.  Neck: Neck supple.  Cardiovascular: Normal rate, regular rhythm, normal heart sounds and intact distal pulses.   No murmur heard. Pulmonary/Chest: Effort normal and breath sounds normal. She exhibits no tenderness.  Musculoskeletal: She exhibits tenderness. She exhibits no edema.       Cervical back: She exhibits normal range of motion, no bony tenderness, no swelling, no deformity, no laceration and no spasm.       Back:       Multiple small erythematous papules and pustules to the upper back and bilateral posterior shoulders. No edema, bruising, or drainage. No other rashes or lesions to the  torso, neck or UE's  Lymphadenopathy:    She has no cervical adenopathy.  Neurological: She is alert. She exhibits normal muscle tone. Coordination normal.  Skin: Skin is warm and dry.       See MS exam    ED Course  Procedures (including critical care time)       MDM      Previous medical charts, nursing notes and vitals signs from this visit were reviewed by me   All laboratory results and/or imaging results performed on this visit, if applicable, were reviewed by me and discussed with the patient and/or parent as well as recommendation for follow-up    MEDICATIONS GIVEN IN ED:  Norco, prednisone  Multiple, confluent, erythematous papules to the upper back and across the shoulders. few scattered pustules also present.  Probable first to second degree burns of the upper back appears to be healing. Skin is still peeling.  She is smiling and appears non-toxic.  NV intact.  Possible contact dermatitis related to the OTC creams applied to the sunburn vs secondary reaction to the sunburn      PRESCRIPTIONS GIVEN AT DISCHARGE:  Prednisone taper, norco #8, keflex   Pt stable in ED with no significant deterioration in condition. Pt feels improved after observation and/or treatment in ED. Patient / Family / Caregiver understand and agree with initial ED impression and plan with expectations set for ED visit.  Patient agrees to return to ED for any worsening symptoms      Lichelle Viets L. Elsie, [place] 05/31/12 2114

## 2012-05-31 NOTE — ED Notes (Signed)
States she had a sunburn and her back is now tingling

## 2012-05-31 NOTE — ED Notes (Signed)
Pt w/ raised bumps to the back & shoulders. Pt states started last night.

## 2012-06-01 NOTE — ED Provider Notes (Signed)
Medical screening examination/treatment/procedure(s) were performed by non-physician practitioner and as supervising physician I was immediately available for consultation/collaboration.   Joya Gaskins, MD 06/01/12 [phone]

## 2012-08-15 ENCOUNTER — Emergency Department (HOSPITAL_COMMUNITY): Payer: Self-pay

## 2012-08-15 ENCOUNTER — Encounter (HOSPITAL_COMMUNITY): Payer: Self-pay | Admitting: Emergency Medicine

## 2012-08-15 ENCOUNTER — Emergency Department (HOSPITAL_COMMUNITY)
Admission: EM | Admit: 2012-08-15 | Discharge: 2012-08-15 | Disposition: A | Discharge status: Home / Self Care | Payer: Self-pay | Attending: Emergency Medicine | Admitting: Emergency Medicine

## 2012-08-15 DIAGNOSIS — K5900 Constipation, unspecified: Secondary | ICD-10-CM | POA: Insufficient documentation

## 2012-08-15 DIAGNOSIS — Z9089 Acquired absence of other organs: Secondary | ICD-10-CM | POA: Insufficient documentation

## 2012-08-15 DIAGNOSIS — K219 Gastro-esophageal reflux disease without esophagitis: Secondary | ICD-10-CM | POA: Insufficient documentation

## 2012-08-15 DIAGNOSIS — R109 Unspecified abdominal pain: Secondary | ICD-10-CM | POA: Insufficient documentation

## 2012-08-15 DIAGNOSIS — G8929 Other chronic pain: Secondary | ICD-10-CM | POA: Insufficient documentation

## 2012-08-15 DIAGNOSIS — F17200 Nicotine dependence, unspecified, uncomplicated: Secondary | ICD-10-CM | POA: Insufficient documentation

## 2012-08-15 DIAGNOSIS — M549 Dorsalgia, unspecified: Secondary | ICD-10-CM | POA: Insufficient documentation

## 2012-08-15 HISTORY — DX: Other chronic pain: G89.29

## 2012-08-15 HISTORY — DX: Unspecified abdominal pain: R10.9

## 2012-08-15 LAB — URINALYSIS, ROUTINE W REFLEX MICROSCOPIC
Bilirubin Urine: NEGATIVE
Glucose, UA: NEGATIVE mg/dL
Hgb urine dipstick: NEGATIVE
Ketones, ur: NEGATIVE mg/dL
Leukocytes, UA: NEGATIVE
Nitrite: NEGATIVE
Protein, ur: NEGATIVE mg/dL
Specific Gravity, Urine: <1.005 — ABNORMAL LOW (ref 1.005–1.030)
Urobilinogen, UA: 0.2 mg/dL (ref 0.0–1.0)
pH: 7.5 (ref 5.0–8.0)

## 2012-08-15 LAB — COMPREHENSIVE METABOLIC PANEL
ALT: 16 U/L (ref 0–35)
Albumin: 3.3 g/dL — ABNORMAL LOW (ref 3.5–5.2)
Alkaline Phosphatase: 60 U/L (ref 39–117)
BUN: 5 mg/dL — ABNORMAL LOW (ref 6–23)
Calcium: 9.4 mg/dL (ref 8.4–10.5)
GFR calc Af Amer: >90 mL/min (ref >90)
Potassium: 2.9 mEq/L — ABNORMAL LOW (ref 3.5–5.1)
Sodium: 141 mEq/L (ref 135–145)
Total Protein: 6.7 g/dL (ref 6.0–8.3)

## 2012-08-15 LAB — CBC WITH DIFFERENTIAL/PLATELET
Basophils Relative: 0 % (ref 0–1)
Eosinophils Absolute: 0.3 10*3/uL (ref 0.0–0.7)
Eosinophils Relative: 4 % (ref 0–5)
MCH: 29.8 pg (ref 26.0–34.0)
MCHC: 34.5 g/dL (ref 30.0–36.0)
MCV: 86.5 fL (ref 78.0–100.0)
Neutrophils Relative %: 49 % (ref 43–77)
Platelets: 324 10*3/uL (ref 150–400)
RDW: 13.5 % (ref 11.5–15.5)

## 2012-08-15 LAB — LIPASE, BLOOD: Lipase: 29 U/L (ref 11–59)

## 2012-08-15 MED ORDER — OXYCODONE-ACETAMINOPHEN 5-325 MG PO TABS: ORAL_TABLET | ORAL | Status: AC

## 2012-08-15 MED ORDER — OXYCODONE-ACETAMINOPHEN 5-325 MG PO TABS
2.0000 | ORAL_TABLET | Freq: Once | ORAL | Status: AC
  Administered 2012-08-15: 2 via ORAL
  Filled 2012-08-15: qty 2

## 2012-08-15 MED ORDER — POTASSIUM CHLORIDE 20 MEQ/15ML (10%) PO LIQD
40.0000 meq | Freq: Once | ORAL | Status: AC
  Administered 2012-08-15: 40 meq via ORAL
  Filled 2012-08-15: qty 30

## 2012-08-15 MED ORDER — ONDANSETRON 8 MG PO TBDP
8.0000 mg | ORAL_TABLET | Freq: Once | ORAL | Status: AC
  Administered 2012-08-15: 8 mg via ORAL
  Filled 2012-08-15: qty 1

## 2012-08-15 MED ORDER — FAMOTIDINE 20 MG PO TABS
40.0000 mg | ORAL_TABLET | Freq: Once | ORAL | Status: AC
  Administered 2012-08-15: 40 mg via ORAL
  Filled 2012-08-15: qty 2

## 2012-08-15 MED ORDER — HYDROMORPHONE HCL PF 2 MG/ML IJ SOLN
2.0000 mg | Freq: Once | INTRAMUSCULAR | Status: AC
  Administered 2012-08-15: 2 mg via INTRAMUSCULAR
  Filled 2012-08-15: qty 1

## 2012-08-15 MED ORDER — GI COCKTAIL ~~LOC~~
30.0000 mL | Freq: Once | ORAL | Status: AC
  Administered 2012-08-15: 30 mL via ORAL
  Filled 2012-08-15: qty 30

## 2012-08-15 MED ORDER — PROMETHAZINE HCL 25 MG PO TABS: 25.0000 mg | ORAL_TABLET | Freq: Four times a day (QID) | ORAL | Status: DC | PRN

## 2012-08-15 NOTE — ED Notes (Signed)
In and Out cath not needed pt voided on own; MD aware Order received to discontinue in and out

## 2012-08-15 NOTE — ED Notes (Signed)
Pts questions answered; pt instructed not to drive while on pain medication; pt verbalizes understanding

## 2012-08-15 NOTE — ED Notes (Signed)
In and Out cath not needed per MD; pt able to void on own

## 2012-08-15 NOTE — ED Provider Notes (Signed)
History     CSN: [id number]  Arrival date & time 08/15/12  2015   First MD Initiated Contact with Patient 08/15/12 2036      Chief Complaint  Patient presents with  . Abdominal Pain     HPI Pt was seen at 2055.  Per pt, c/o gradual onset and persistence of constant acute flair of her chronic abd "pain" for the past 2 days.  Describes the pain as "sharp," and "it might be my pancreatitis again."  Has been associated with several intermittent episodes of N/V/D.  Denies CP/SOB, no back pain, no black or blood in stools or emesis, no fevers, no rash.     Past Medical History  Diagnosis Date  . Pancreatitis   . Peptic ulcer   . GERD (gastroesophageal reflux disease)   . Hiatal hernia   . Chronic back pain   . Chronic abdominal pain     Past Surgical History  Procedure Date  . Cholecystectomy   . Cesarean section   . Tubial ligation     Family History  Problem Relation Age of Onset  . Asthma Mother   . Heart failure Mother   . Cancer Mother   . Diabetes Mother   . Hypertension Mother   . Stroke Mother   . Heart failure Father   . Diabetes Father     History  Substance Use Topics  . Smoking status: Current Everyday Smoker -- 0.5 packs/day for 20 years    Types: Cigarettes  . Smokeless tobacco: Never Used  . Alcohol Use: No    OB History    Grav Para Term Preterm Abortions TAB SAB Ect Mult Living   4 3 3  1  1   3       Review of Systems ROS: Statement: All systems negative except as marked or noted in the HPI; Constitutional: Negative for fever and chills. ; ; Eyes: Negative for eye pain, redness and discharge. ; ; ENMT: Negative for ear pain, hoarseness, nasal congestion, sinus pressure and sore throat. ; ; Cardiovascular: Negative for chest pain, palpitations, diaphoresis, dyspnea and peripheral edema. ; ; Respiratory: Negative for cough, wheezing and stridor. ; ; Gastrointestinal: +N/V/D, abd pain. Negative for blood in stool, hematemesis, jaundice and rectal  bleeding. . ; ; Genitourinary: Negative for dysuria, flank pain and hematuria. ; ; Musculoskeletal: Negative for back pain and neck pain. Negative for swelling and trauma.; ; Skin: Negative for pruritus, rash, abrasions, blisters, bruising and skin lesion.; ; Neuro: Negative for headache, lightheadedness and neck stiffness. Negative for weakness, altered level of consciousness , altered mental status, extremity weakness, paresthesias, involuntary movement, seizure and syncope.       Allergies  Review of patient's allergies indicates no known allergies.  Home Medications   Current Outpatient Rx  Name Route Sig Dispense Refill  . ASPIRIN 325 MG PO TABS Oral Take 325 mg by mouth daily as needed. For pain    . IBUPROFEN 200 MG PO TABS Oral Take 800 mg by mouth daily as needed. For pain      BP 144/78  Pulse 77  Temp 98 F (36.7 C) (Oral)  Resp 20  Ht 5\' 7"  (1.702 m)  Wt 170 lb (77.111 kg)  BMI 26.63 kg/m2  SpO2 100%  LMP 02/09/2012  Physical Exam 2100: Physical examination:  Nursing notes reviewed; Vital signs and O2 SAT reviewed;  Constitutional: Well developed, Well nourished, Well hydrated, In no acute distress; Head:  Normocephalic, atraumatic; Eyes: EOMI,  PERRL, No scleral icterus; ENMT: Mouth and pharynx normal, Mucous membranes moist; Neck: Supple, Full range of motion, No lymphadenopathy; Cardiovascular: Regular rate and rhythm, No gallop; Respiratory: Breath sounds clear & equal bilaterally, No wheezes.  Speaking full sentences with ease, Normal respiratory effort/excursion; Chest: Nontender, Movement normal; Abdomen: Soft, Nontender, Nondistended, Normal bowel sounds; Genitourinary: No CVA tenderness; Extremities: Pulses normal, No tenderness, No edema, No calf edema or asymmetry.; Neuro: AA&Ox3, Major CN grossly intact.  Speech clear. No gross focal motor or sensory deficits in extremities.; Skin: Color normal, Warm, Dry.   ED Course  Procedures   MDM  MDM Reviewed:  nursing note, vitals and previous chart Reviewed previous: labs Interpretation: labs and x-ray     Results for orders placed during the hospital encounter of 08/15/12  CBC WITH DIFFERENTIAL      Component Value Range   WBC 7.7  4.0 - 10.5 K/uL   RBC 4.06  3.87 - 5.11 MIL/uL   Hemoglobin 12.1  12.0 - 15.0 g/dL   HCT 16.1 (*) 09.6 - 04.5 %   MCV 86.5  78.0 - 100.0 fL   MCH 29.8  26.0 - 34.0 pg   MCHC 34.5  30.0 - 36.0 g/dL   RDW 40.9  81.1 - 91.4 %   Platelets 324  150 - 400 K/uL   Neutrophils Relative 49  43 - 77 %   Neutro Abs 3.8  1.7 - 7.7 K/uL   Lymphocytes Relative 38  12 - 46 %   Lymphs Abs 3.0  0.7 - 4.0 K/uL   Monocytes Relative 9  3 - 12 %   Monocytes Absolute 0.7  0.1 - 1.0 K/uL   Eosinophils Relative 4  0 - 5 %   Eosinophils Absolute 0.3  0.0 - 0.7 K/uL   Basophils Relative 0  0 - 1 %   Basophils Absolute 0.0  0.0 - 0.1 K/uL  COMPREHENSIVE METABOLIC PANEL      Component Value Range   Sodium 141  135 - 145 mEq/L   Potassium 2.9 (*) 3.5 - 5.1 mEq/L   Chloride 103  96 - 112 mEq/L   CO2 28  19 - 32 mEq/L   Glucose, Bld 111 (*) 70 - 99 mg/dL   BUN 5 (*) 6 - 23 mg/dL   Creatinine, Ser 7.82  0.50 - 1.10 mg/dL   Calcium 9.4  8.4 - 95.6 mg/dL   Total Protein 6.7  6.0 - 8.3 g/dL   Albumin 3.3 (*) 3.5 - 5.2 g/dL   AST 14  0 - 37 U/L   ALT 16  0 - 35 U/L   Alkaline Phosphatase 60  39 - 117 U/L   Total Bilirubin 0.2 (*) 0.3 - 1.2 mg/dL   GFR calc non Af Amer >90  >90 mL/min   GFR calc Af Amer >90  >90 mL/min  LIPASE, BLOOD      Component Value Range   Lipase 29  11 - 59 U/L  PREGNANCY, URINE      Component Value Range   Preg Test, Ur NEGATIVE  NEGATIVE  URINALYSIS, ROUTINE W REFLEX MICROSCOPIC      Component Value Range   Color, Urine STRAW (*) YELLOW   APPearance CLEAR  CLEAR   Specific Gravity, Urine <1.005 (*) 1.005 - 1.030   pH 7.5  5.0 - 8.0   Glucose, UA NEGATIVE  NEGATIVE mg/dL   Hgb urine dipstick NEGATIVE  NEGATIVE   Bilirubin Urine NEGATIVE  NEGATIVE  Ketones, ur NEGATIVE  NEGATIVE mg/dL   Protein, ur NEGATIVE  NEGATIVE mg/dL   Urobilinogen, UA 0.2  0.0 - 1.0 mg/dL   Nitrite NEGATIVE  NEGATIVE   Leukocytes, UA NEGATIVE  NEGATIVE   Dg Abd Acute W/chest 08/15/2012  *RADIOLOGY REPORT*  Clinical Data: Abdominal pain.  Rule out bowel obstruction.  ACUTE ABDOMEN SERIES (ABDOMEN 2 VIEW & CHEST 1 VIEW)  Comparison: 09/17/2011  Findings: Chest:  Mild cardiac enlargement.  Negative for heart failure or pneumonia.  Abdomen:  Normal bowel gas pattern.  Mild constipation with stool throughout the colon and rectum.  No air-fluid level.  Negative for pneumoperitoneum.  Surgical clips in the gallbladder fossa. Negative for renal calculi.  No bony abnormality.  IMPRESSION: No active cardiopulmonary disease.  Mild constipation without bowel obstruction.   Original Report Authenticated By: Camelia Phenes, M.D.      2230:  No vomiting in the ED.  Has tol PO well.  Potassium repleted PO. Has ambulated with steady gait.  VS remain stable.  Pt has had multiple ED visits for same.  No acute findings on workup today. Dx testing d/w pt.  Questions answered.  Verb understanding, agreeable to d/c home with outpt f/u.       Laray Anger, DO 08/16/12 2058

## 2012-08-15 NOTE — ED Notes (Signed)
Patient complaining of abdominal pain x 2 days. Also complaining of vomiting and diarrhea today. States "I think my pancreas is acting up again. I am really swollen in my belly and it feels like knives in my stomach."

## 2012-08-16 LAB — URINE CULTURE: Colony Count: 5000

## 2012-10-23 ENCOUNTER — Emergency Department (HOSPITAL_COMMUNITY): Payer: Self-pay

## 2012-10-23 ENCOUNTER — Emergency Department (HOSPITAL_COMMUNITY)
Admission: EM | Admit: 2012-10-23 | Discharge: 2012-10-23 | Disposition: A | Discharge status: Home / Self Care | Payer: Self-pay | Attending: Emergency Medicine | Admitting: Emergency Medicine

## 2012-10-23 ENCOUNTER — Encounter (HOSPITAL_COMMUNITY): Payer: Self-pay | Admitting: *Deleted

## 2012-10-23 DIAGNOSIS — R1084 Generalized abdominal pain: Secondary | ICD-10-CM | POA: Insufficient documentation

## 2012-10-23 DIAGNOSIS — F17200 Nicotine dependence, unspecified, uncomplicated: Secondary | ICD-10-CM | POA: Insufficient documentation

## 2012-10-23 DIAGNOSIS — R112 Nausea with vomiting, unspecified: Secondary | ICD-10-CM | POA: Insufficient documentation

## 2012-10-23 DIAGNOSIS — R109 Unspecified abdominal pain: Secondary | ICD-10-CM | POA: Insufficient documentation

## 2012-10-23 DIAGNOSIS — K219 Gastro-esophageal reflux disease without esophagitis: Secondary | ICD-10-CM | POA: Insufficient documentation

## 2012-10-23 DIAGNOSIS — R748 Abnormal levels of other serum enzymes: Secondary | ICD-10-CM | POA: Insufficient documentation

## 2012-10-23 DIAGNOSIS — G8929 Other chronic pain: Secondary | ICD-10-CM | POA: Insufficient documentation

## 2012-10-23 DIAGNOSIS — E876 Hypokalemia: Secondary | ICD-10-CM | POA: Insufficient documentation

## 2012-10-23 DIAGNOSIS — Z8719 Personal history of other diseases of the digestive system: Secondary | ICD-10-CM | POA: Insufficient documentation

## 2012-10-23 DIAGNOSIS — Z3202 Encounter for pregnancy test, result negative: Secondary | ICD-10-CM | POA: Insufficient documentation

## 2012-10-23 DIAGNOSIS — Z7982 Long term (current) use of aspirin: Secondary | ICD-10-CM | POA: Insufficient documentation

## 2012-10-23 DIAGNOSIS — M549 Dorsalgia, unspecified: Secondary | ICD-10-CM | POA: Insufficient documentation

## 2012-10-23 LAB — URINE MICROSCOPIC-ADD ON

## 2012-10-23 LAB — COMPREHENSIVE METABOLIC PANEL
Albumin: 3.6 g/dL (ref 3.5–5.2)
Alkaline Phosphatase: 250 U/L — ABNORMAL HIGH (ref 39–117)
BUN: 5 mg/dL — ABNORMAL LOW (ref 6–23)
CO2: 29 mEq/L (ref 19–32)
Chloride: 97 mEq/L (ref 96–112)
Potassium: 2.8 mEq/L — ABNORMAL LOW (ref 3.5–5.1)
Total Bilirubin: 1.8 mg/dL — ABNORMAL HIGH (ref 0.3–1.2)

## 2012-10-23 LAB — URINALYSIS, ROUTINE W REFLEX MICROSCOPIC
Glucose, UA: NEGATIVE mg/dL
Ketones, ur: NEGATIVE mg/dL
pH: 6 (ref 5.0–8.0)

## 2012-10-23 LAB — CBC WITH DIFFERENTIAL/PLATELET
Basophils Relative: 1 % (ref 0–1)
Hemoglobin: 14 g/dL (ref 12.0–15.0)
Lymphocytes Relative: 41 % (ref 12–46)
Lymphs Abs: 3.5 10*3/uL (ref 0.7–4.0)
MCHC: 35.2 g/dL (ref 30.0–36.0)
Monocytes Relative: 9 % (ref 3–12)
Neutro Abs: 3.9 10*3/uL (ref 1.7–7.7)
Neutrophils Relative %: 45 % (ref 43–77)
RBC: 4.66 MIL/uL (ref 3.87–5.11)
WBC: 8.5 10*3/uL (ref 4.0–10.5)

## 2012-10-23 LAB — LIPASE, BLOOD: Lipase: 61 U/L — ABNORMAL HIGH (ref 11–59)

## 2012-10-23 MED ORDER — FAMOTIDINE IN NACL 20-0.9 MG/50ML-% IV SOLN
20.0000 mg | Freq: Once | INTRAVENOUS | Status: AC
  Administered 2012-10-23: 20 mg via INTRAVENOUS
  Filled 2012-10-23: qty 50

## 2012-10-23 MED ORDER — SODIUM CHLORIDE 0.9 % IV SOLN
INTRAVENOUS | Status: DC
  Administered 2012-10-23 (×2): via INTRAVENOUS

## 2012-10-23 MED ORDER — MORPHINE SULFATE 4 MG/ML IJ SOLN
4.0000 mg | INTRAMUSCULAR | Status: AC | PRN
  Administered 2012-10-23 (×2): 4 mg via INTRAVENOUS
  Filled 2012-10-23 (×2): qty 1

## 2012-10-23 MED ORDER — FENTANYL CITRATE 0.05 MG/ML IJ SOLN
100.0000 ug | INTRAMUSCULAR | Status: AC | PRN
  Administered 2012-10-23 (×2): 100 ug via INTRAVENOUS
  Filled 2012-10-23 (×2): qty 2

## 2012-10-23 MED ORDER — POTASSIUM CHLORIDE 20 MEQ/15ML (10%) PO LIQD
40.0000 meq | Freq: Once | ORAL | Status: AC
  Administered 2012-10-23: 40 meq via ORAL
  Filled 2012-10-23: qty 30

## 2012-10-23 MED ORDER — IOHEXOL 300 MG/ML  SOLN
100.0000 mL | Freq: Once | INTRAMUSCULAR | Status: AC | PRN
  Administered 2012-10-23: 100 mL via INTRAVENOUS

## 2012-10-23 MED ORDER — POTASSIUM CHLORIDE 10 MEQ/100ML IV SOLN
10.0000 meq | Freq: Once | INTRAVENOUS | Status: AC
  Administered 2012-10-23: 10 meq via INTRAVENOUS
  Filled 2012-10-23: qty 100

## 2012-10-23 MED ORDER — ONDANSETRON HCL 4 MG/2ML IJ SOLN
4.0000 mg | INTRAMUSCULAR | Status: AC | PRN
  Administered 2012-10-23 (×2): 4 mg via INTRAVENOUS
  Filled 2012-10-23 (×2): qty 2

## 2012-10-23 MED ORDER — PROMETHAZINE HCL 25 MG PO TABS: 25.0000 mg | ORAL_TABLET | Freq: Four times a day (QID) | ORAL | Status: DC | PRN

## 2012-10-23 NOTE — ED Notes (Signed)
Pt ambulated to restroom without any problems at this time. Rita [name] 

## 2012-10-23 NOTE — ED Provider Notes (Signed)
History     CSN: [id number]  Arrival date & time 10/23/12  1335   First MD Initiated Contact with Patient 10/23/12 1516      Chief Complaint  Patient presents with  . Abdominal Pain     HPI Pt was seen at 1520.  Per pt, c/o gradual onset and persistence of constant generalized abd "pain" for the past 2 days.  Has been associated with multiple intermittent episodes of N/V.  Describes her symptoms as "it feels like my pancreatitis."  Denies fevers, no back pain, no CP/SOB, no rash, no diarrhea, no black or blood in stools or emesis.      Past Medical History  Diagnosis Date  . Pancreatitis   . Peptic ulcer   . GERD (gastroesophageal reflux disease)   . Hiatal hernia   . Chronic back pain   . Chronic abdominal pain     Past Surgical History  Procedure Date  . Cholecystectomy   . Cesarean section   . Tubial ligation     Family History  Problem Relation Age of Onset  . Asthma Mother   . Heart failure Mother   . Cancer Mother   . Diabetes Mother   . Hypertension Mother   . Stroke Mother   . Heart failure Father   . Diabetes Father     History  Substance Use Topics  . Smoking status: Current Every Day Smoker -- 0.5 packs/day for 20 years    Types: Cigarettes  . Smokeless tobacco: Never Used  . Alcohol Use: No    OB History    Grav Para Term Preterm Abortions TAB SAB Ect Mult Living   4 3 3  1  1   3       Review of Systems ROS: Statement: All systems negative except as marked or noted in the HPI; Constitutional: Negative for fever and chills. ; ; Eyes: Negative for eye pain, redness and discharge. ; ; ENMT: Negative for ear pain, hoarseness, nasal congestion, sinus pressure and sore throat. ; ; Cardiovascular: Negative for chest pain, palpitations, diaphoresis, dyspnea and peripheral edema. ; ; Respiratory: Negative for cough, wheezing and stridor. ; ; Gastrointestinal: +abd pain, N/V.  Negative for diarrhea, blood in stool, hematemesis, jaundice and rectal  bleeding. . ; ; Genitourinary: Negative for dysuria, flank pain and hematuria. ; ; Musculoskeletal: Negative for back pain and neck pain. Negative for swelling and trauma.; ; Skin: Negative for pruritus, rash, abrasions, blisters, bruising and skin lesion.; ; Neuro: Negative for headache, lightheadedness and neck stiffness. Negative for weakness, altered level of consciousness , altered mental status, extremity weakness, paresthesias, involuntary movement, seizure and syncope.       Allergies  Review of patient's allergies indicates no known allergies.  Home Medications   Current Outpatient Rx  Name Route Sig Dispense Refill  . ASPIRIN 325 MG PO TABS Oral Take 325 mg by mouth daily as needed. For pain    . IBUPROFEN 200 MG PO TABS Oral Take 800 mg by mouth daily as needed. For pain      BP 120/88  Pulse 71  Temp 98 F (36.7 C) (Oral)  Resp 18  Ht 5\' 7"  (1.702 m)  Wt 170 lb (77.111 kg)  BMI 26.63 kg/m2  SpO2 99%  LMP 02/09/2012  Physical Exam 1525: Physical examination:  Nursing notes reviewed; Vital signs and O2 SAT reviewed;  Constitutional: Well developed, Well nourished, Well hydrated, Uncomfortable appearing.; Head:  Normocephalic, atraumatic; Eyes: EOMI, PERRL, No scleral  icterus; ENMT: Mouth and pharynx normal, Mucous membranes moist; Neck: Supple, Full range of motion, No lymphadenopathy; Cardiovascular: Regular rate and rhythm, No murmur, rub, or gallop; Respiratory: Breath sounds clear & equal bilaterally, No rales, rhonchi, wheezes.  Speaking full sentences with ease, Normal respiratory effort/excursion; Chest: Nontender, Movement normal; Abdomen: Soft, +diffusely tender to palp. No rebound or guarding. Nondistended, Normal bowel sounds; Genitourinary: No CVA tenderness; Extremities: Pulses normal, No tenderness, No edema, No calf edema or asymmetry.; Neuro: AA&Ox3, Major CN grossly intact.  Speech clear. Walking around exam room with steady upright gait. No gross focal motor  or sensory deficits in extremities.; Skin: Color normal, Warm, Dry.   ED Course  Procedures   1720:  LFT's elevated from previous, pt is already s/p cholecystectomy.  [place] RUQ without acute findings.  Pt still c/o abd pain and requesting more pain meds; will check CT scan.    MDM  MDM Reviewed: nursing note, vitals and previous chart Reviewed previous: labs Interpretation: labs, x-ray, CT scan and ultrasound   Results for orders placed during the hospital encounter of 10/23/12  CBC WITH DIFFERENTIAL      Component Value Range   WBC 8.5  4.0 - 10.5 K/uL   RBC 4.66  3.87 - 5.11 MIL/uL   Hemoglobin 14.0  12.0 - 15.0 g/dL   HCT 16.1  09.6 - 04.5 %   MCV 85.4  78.0 - 100.0 fL   MCH 30.0  26.0 - 34.0 pg   MCHC 35.2  30.0 - 36.0 g/dL   RDW 40.9  81.1 - 91.4 %   Platelets 337  150 - 400 K/uL   Neutrophils Relative 45  43 - 77 %   Neutro Abs 3.9  1.7 - 7.7 K/uL   Lymphocytes Relative 41  12 - 46 %   Lymphs Abs 3.5  0.7 - 4.0 K/uL   Monocytes Relative 9  3 - 12 %   Monocytes Absolute 0.7  0.1 - 1.0 K/uL   Eosinophils Relative 5  0 - 5 %   Eosinophils Absolute 0.4  0.0 - 0.7 K/uL   Basophils Relative 1  0 - 1 %   Basophils Absolute 0.1  0.0 - 0.1 K/uL  COMPREHENSIVE METABOLIC PANEL      Component Value Range   Sodium 137  135 - 145 mEq/L   Potassium 2.8 (*) 3.5 - 5.1 mEq/L   Chloride 97  96 - 112 mEq/L   CO2 29  19 - 32 mEq/L   Glucose, Bld 90  70 - 99 mg/dL   BUN 5 (*) 6 - 23 mg/dL   Creatinine, Ser 7.82  0.50 - 1.10 mg/dL   Calcium 9.6  8.4 - 95.6 mg/dL   Total Protein 7.3  6.0 - 8.3 g/dL   Albumin 3.6  3.5 - 5.2 g/dL   AST 213 (*) 0 - 37 U/L   ALT 685 (*) 0 - 35 U/L   Alkaline Phosphatase 250 (*) 39 - 117 U/L   Total Bilirubin 1.8 (*) 0.3 - 1.2 mg/dL   GFR calc non Af Amer >90  >90 mL/min   GFR calc Af Amer >90  >90 mL/min  LIPASE, BLOOD      Component Value Range   Lipase 61 (*) 11 - 59 U/L  URINALYSIS, ROUTINE W REFLEX MICROSCOPIC      Component Value Range    Color, Urine YELLOW  YELLOW   APPearance CLEAR  CLEAR   Specific Gravity, Urine 1.015  1.005 - 1.030   pH 6.0  5.0 - 8.0   Glucose, UA NEGATIVE  NEGATIVE mg/dL   Hgb urine dipstick NEGATIVE  NEGATIVE   Bilirubin Urine SMALL (*) NEGATIVE   Ketones, ur NEGATIVE  NEGATIVE mg/dL   Protein, ur NEGATIVE  NEGATIVE mg/dL   Urobilinogen, UA 0.2  0.0 - 1.0 mg/dL   Nitrite NEGATIVE  NEGATIVE   Leukocytes, UA SMALL (*) NEGATIVE  PREGNANCY, URINE      Component Value Range   Preg Test, Ur NEGATIVE  NEGATIVE  URINE MICROSCOPIC-ADD ON      Component Value Range   Squamous Epithelial / LPF MANY (*) RARE   WBC, UA 3-6  <3 WBC/hpf   Bacteria, UA FEW (*) RARE   US Abdomen Complete 10/23/2012  *RADIOLOGY REPORT*  Clinical Data:  Abdominal pain with elevated LFT.  Cholecystectomy  COMPLETE ABDOMINAL ULTRASOUND  Comparison:  CT 08/28/2011  Findings:  Gallbladder:  Surgically absent gallbladder  Common bile duct:  7.8 mm which is within normal limits for cholecystectomy.  Liver:  Mild hepatomegaly measuring 20 cm in length.  Increased echogenicity of the liver without focal mass lesion.  IVC:  Appears normal.  Pancreas:  Not well visualized  Spleen:  7.7 cm  Right Kidney:  11.0 cm.  Negative for obstruction  Left Kidney:  11.8 cm.  Negative for obstruction  Abdominal aorta:  No aneurysm identified.  IMPRESSION: Common bile duct is 7.8 mm which is within normal limits for cholecystectomy.  Hepatomegaly with fatty infiltration of the liver.  Pancreas not well visualized.   Original Report Authenticated By: Camelia Phenes, M.D.     Ct Abdomen Pelvis W Contrast 10/23/2012  *RADIOLOGY REPORT*  Clinical Data: Abdominal pain.  Previous cholecystectomy, C- section.  CT ABDOMEN AND PELVIS WITH CONTRAST  Technique:  Multidetector CT imaging of the abdomen and pelvis was performed following the standard protocol during bolus administration of intravenous contrast.  Contrast: OMNIPAQUE IOHEXOL 300 MG/ML  SOLN   Comparison: 08/28/2011  Findings: Dependent atelectasis posteriorly in the visualized lung bases.  Vascular clips in the gallbladder fossa.  Sub centimeter cystic collection in the gallbladder fossa is stable.  No other liver lesion.  Unremarkable spleen, adrenal glands, kidneys, pancreas.  Patchy aortoiliac arterial plaque without aneurysm. Stomach physiologically distended.  Small bowel nondilated.  Normal appendix.  The colon is nondilated, with a few sigmoid diverticuli without adjacent inflammatory or edematous change.  Urinary bladder incompletely distended.  Uterus and adnexal regions unremarkable. No ascites.  No free air.  No adenopathy.  Portal vein patent. Lumbar spine intact.  IMPRESSION:  1.  No acute abdominal process. 2. Scattered sigmoid diverticuli.   Original Report Authenticated By: Osa Craver, M.D.    Dg Abd Acute W/chest 10/23/2012  *RADIOLOGY REPORT*  Clinical Data: Abdominal pain  ACUTE ABDOMEN SERIES (ABDOMEN 2 VIEW & CHEST 1 VIEW)  Comparison: 08/15/2012  Findings: Heart size upper limits normal for technique.  Prominent bibasilar interstitial markings persist.  No effusion.  No free air.  Vascular clips in the right upper abdomen.  Small bowel is decompressed.  Normal distribution of gas and stool throughout the colon.  No abnormal abdominal calcifications.  Regional bones unremarkable.  IMPRESSION:  1.  No acute cardiopulmonary disease. 2.  Nonobstructive bowel gas pattern. 3.  No free air.   Original Report Authenticated By: Osa Craver, M.D.       2115:  Pt has ambulated with steady gait, easy  resps.  Has tol PO well without N/V.  LFT's elevated higher than previous.  Potassium repleted PO and IV.  No other acute findings otherwise on workup today.  Pt wants to go home now.  Dx and testing d/w pt.  Questions answered.  Verb understanding, agreeable to d/c home with outpt f/u this week with her PMD or GI MD for re-check of her LFT's.       Laray Anger, DO 10/25/12 2222

## 2012-10-23 NOTE — ED Notes (Signed)
abd pain since Sunday .  Thinks it is pancreatitis, N/v,

## 2012-10-23 NOTE — ED Notes (Signed)
Pt alert & oriented x4, stable gait. Patient given discharge instructions, paperwork & prescription(s). Patient  instructed to stop at the registration desk to finish any additional paperwork. Patient verbalized understanding. Pt left department w/ no further questions. 

## 2012-10-23 NOTE — ED Notes (Signed)
Pt to US.

## 2012-10-25 LAB — URINE CULTURE

## 2013-03-25 ENCOUNTER — Encounter (HOSPITAL_COMMUNITY): Payer: Self-pay | Admitting: *Deleted

## 2013-03-25 ENCOUNTER — Inpatient Hospital Stay (HOSPITAL_COMMUNITY)
Admission: EM | Admit: 2013-03-25 | Discharge: 2013-03-29 | DRG: 381 | Disposition: A | Discharge status: Home / Self Care | Payer: Self-pay | Attending: Internal Medicine | Admitting: Internal Medicine

## 2013-03-25 ENCOUNTER — Inpatient Hospital Stay (HOSPITAL_COMMUNITY): Payer: Self-pay

## 2013-03-25 DIAGNOSIS — Z720 Tobacco use: Secondary | ICD-10-CM | POA: Diagnosis present

## 2013-03-25 DIAGNOSIS — D649 Anemia, unspecified: Secondary | ICD-10-CM | POA: Diagnosis not present

## 2013-03-25 DIAGNOSIS — M549 Dorsalgia, unspecified: Secondary | ICD-10-CM | POA: Diagnosis present

## 2013-03-25 DIAGNOSIS — K859 Acute pancreatitis without necrosis or infection, unspecified: Secondary | ICD-10-CM

## 2013-03-25 DIAGNOSIS — K2100 Gastro-esophageal reflux disease with esophagitis, without bleeding: Secondary | ICD-10-CM | POA: Diagnosis present

## 2013-03-25 DIAGNOSIS — Z885 Allergy status to narcotic agent status: Secondary | ICD-10-CM

## 2013-03-25 DIAGNOSIS — R1310 Dysphagia, unspecified: Secondary | ICD-10-CM | POA: Diagnosis present

## 2013-03-25 DIAGNOSIS — F17200 Nicotine dependence, unspecified, uncomplicated: Secondary | ICD-10-CM | POA: Diagnosis present

## 2013-03-25 DIAGNOSIS — Z23 Encounter for immunization: Secondary | ICD-10-CM

## 2013-03-25 DIAGNOSIS — K2210 Ulcer of esophagus without bleeding: Secondary | ICD-10-CM | POA: Diagnosis present

## 2013-03-25 DIAGNOSIS — R748 Abnormal levels of other serum enzymes: Secondary | ICD-10-CM | POA: Diagnosis present

## 2013-03-25 DIAGNOSIS — Z8249 Family history of ischemic heart disease and other diseases of the circulatory system: Secondary | ICD-10-CM

## 2013-03-25 DIAGNOSIS — K222 Esophageal obstruction: Secondary | ICD-10-CM | POA: Diagnosis present

## 2013-03-25 DIAGNOSIS — E876 Hypokalemia: Secondary | ICD-10-CM | POA: Diagnosis not present

## 2013-03-25 DIAGNOSIS — R109 Unspecified abdominal pain: Secondary | ICD-10-CM | POA: Diagnosis present

## 2013-03-25 DIAGNOSIS — Z825 Family history of asthma and other chronic lower respiratory diseases: Secondary | ICD-10-CM

## 2013-03-25 DIAGNOSIS — K2270 Barrett's esophagus without dysplasia: Principal | ICD-10-CM | POA: Diagnosis present

## 2013-03-25 DIAGNOSIS — Z833 Family history of diabetes mellitus: Secondary | ICD-10-CM

## 2013-03-25 DIAGNOSIS — Z823 Family history of stroke: Secondary | ICD-10-CM

## 2013-03-25 DIAGNOSIS — Z809 Family history of malignant neoplasm, unspecified: Secondary | ICD-10-CM

## 2013-03-25 DIAGNOSIS — R1319 Other dysphagia: Secondary | ICD-10-CM | POA: Diagnosis present

## 2013-03-25 DIAGNOSIS — K209 Esophagitis, unspecified without bleeding: Secondary | ICD-10-CM | POA: Diagnosis present

## 2013-03-25 DIAGNOSIS — K219 Gastro-esophageal reflux disease without esophagitis: Secondary | ICD-10-CM | POA: Diagnosis present

## 2013-03-25 DIAGNOSIS — G8929 Other chronic pain: Secondary | ICD-10-CM | POA: Diagnosis present

## 2013-03-25 HISTORY — DX: Barrett's esophagus without dysplasia: K22.70

## 2013-03-25 HISTORY — DX: Esophagitis, unspecified: K20.9

## 2013-03-25 HISTORY — DX: Tobacco use: Z72.0

## 2013-03-25 LAB — URINALYSIS, ROUTINE W REFLEX MICROSCOPIC
Glucose, UA: NEGATIVE mg/dL
Leukocytes, UA: NEGATIVE
Nitrite: NEGATIVE
Protein, ur: NEGATIVE mg/dL

## 2013-03-25 LAB — COMPREHENSIVE METABOLIC PANEL
AST: 15 U/L (ref 0–37)
Albumin: 3.9 g/dL (ref 3.5–5.2)
CO2: 24 mEq/L (ref 19–32)
Calcium: 9.6 mg/dL (ref 8.4–10.5)
Creatinine, Ser: 0.9 mg/dL (ref 0.50–1.10)
GFR calc non Af Amer: 75 mL/min — ABNORMAL LOW (ref >90)

## 2013-03-25 LAB — CBC WITH DIFFERENTIAL/PLATELET
Basophils Absolute: 0 10*3/uL (ref 0.0–0.1)
Basophils Relative: 0 % (ref 0–1)
Eosinophils Relative: 1 % (ref 0–5)
HCT: 40.4 % (ref 36.0–46.0)
MCH: 30 pg (ref 26.0–34.0)
MCHC: 33.9 g/dL (ref 30.0–36.0)
MCV: 88.4 fL (ref 78.0–100.0)
Monocytes Absolute: 0.5 10*3/uL (ref 0.1–1.0)
RDW: 13.4 % (ref 11.5–15.5)

## 2013-03-25 MED ORDER — IOHEXOL 300 MG/ML  SOLN: 50.0000 mL | Freq: Once | INTRAMUSCULAR | Status: DC | PRN

## 2013-03-25 MED ORDER — HYDROMORPHONE HCL PF 1 MG/ML IJ SOLN
1.0000 mg | INTRAMUSCULAR | Status: AC | PRN
  Administered 2013-03-25 – 2013-03-26 (×3): 1 mg via INTRAVENOUS
  Filled 2013-03-25 (×3): qty 1

## 2013-03-25 MED ORDER — HEPARIN SODIUM (PORCINE) 5000 UNIT/ML IJ SOLN
5000.0000 [IU] | Freq: Three times a day (TID) | INTRAMUSCULAR | Status: DC
  Administered 2013-03-25 – 2013-03-26 (×3): 5000 [IU] via SUBCUTANEOUS
  Filled 2013-03-25 (×3): qty 1

## 2013-03-25 MED ORDER — PNEUMOCOCCAL VAC POLYVALENT 25 MCG/0.5ML IJ INJ
0.5000 mL | INJECTION | INTRAMUSCULAR | Status: AC
  Administered 2013-03-26: 0.5 mL via INTRAMUSCULAR
  Filled 2013-03-25: qty 0.5

## 2013-03-25 MED ORDER — IOHEXOL 300 MG/ML  SOLN
50.0000 mL | Freq: Once | INTRAMUSCULAR | Status: AC | PRN
  Administered 2013-03-25: 50 mL via ORAL

## 2013-03-25 MED ORDER — INFLUENZA VIRUS VACC SPLIT PF IM SUSP
0.5000 mL | INTRAMUSCULAR | Status: AC
  Administered 2013-03-26: 0.5 mL via INTRAMUSCULAR
  Filled 2013-03-25: qty 0.5

## 2013-03-25 MED ORDER — HYDROMORPHONE HCL PF 2 MG/ML IJ SOLN
2.0000 mg | Freq: Once | INTRAMUSCULAR | Status: AC
  Administered 2013-03-25: 2 mg via INTRAVENOUS
  Filled 2013-03-25: qty 1

## 2013-03-25 MED ORDER — ONDANSETRON HCL 4 MG/2ML IJ SOLN: 4.0000 mg | Freq: Three times a day (TID) | INTRAMUSCULAR | Status: AC | PRN

## 2013-03-25 MED ORDER — MORPHINE SULFATE 4 MG/ML IJ SOLN
4.0000 mg | Freq: Once | INTRAMUSCULAR | Status: AC
  Administered 2013-03-25: 4 mg via INTRAVENOUS
  Filled 2013-03-25: qty 1

## 2013-03-25 MED ORDER — SODIUM CHLORIDE 0.9 % IV SOLN: INTRAVENOUS | Status: DC

## 2013-03-25 MED ORDER — SODIUM CHLORIDE 0.9 % IV SOLN
INTRAVENOUS | Status: DC
  Administered 2013-03-25: 23:00:00 via INTRAVENOUS
  Administered 2013-03-25: 150 mL/h via INTRAVENOUS
  Administered 2013-03-26 – 2013-03-29 (×5): via INTRAVENOUS

## 2013-03-25 MED ORDER — ONDANSETRON HCL 4 MG/2ML IJ SOLN
4.0000 mg | Freq: Once | INTRAMUSCULAR | Status: AC
  Administered 2013-03-25: 4 mg via INTRAVENOUS
  Filled 2013-03-25: qty 2

## 2013-03-25 MED ORDER — SODIUM CHLORIDE 0.9 % IV BOLUS (SEPSIS)
1000.0000 mL | Freq: Once | INTRAVENOUS | Status: AC
  Administered 2013-03-25: 1000 mL via INTRAVENOUS

## 2013-03-25 MED ORDER — IOHEXOL 300 MG/ML  SOLN
100.0000 mL | Freq: Once | INTRAMUSCULAR | Status: AC | PRN
  Administered 2013-03-25: 100 mL via INTRAVENOUS

## 2013-03-25 NOTE — ED Notes (Signed)
abd chest, back pain, onset 2 am, Hx of pancreatitis.  Vomiting,

## 2013-03-25 NOTE — ED Notes (Signed)
Report called to Tameka, RN on unit 300. 

## 2013-03-25 NOTE — ED Provider Notes (Signed)
History  This chart was scribed for Geoffery Lyons, MD by Bennett Scrape, ED Scribe. This patient was seen in room [facility] and the patient's care was started at 1:59 PM.  CSN: [id number]  Arrival date & time 03/25/13  1247   First MD Initiated Contact with Patient 03/25/13 1359      Chief Complaint  Patient presents with  . Abdominal Pain     The history is provided by the patient. No language interpreter was used.    Rita [name] is a 47 y.o. female with a h/o pancreatitis who presents to the Emergency Department complaining of a flare up of intermittent chronic epigastric abdominal pain that radiates into the back with associated chills, nausea, emesis and abdominal pain that started 12 hours ago. Pt states that she has been hospitalized for the same diagnosed as pancreatitis in the past and attempted to rest as instructed with no improvement. She reports chronic back pain but nothing this severe. She denies any known fevers, diarrhea, hematemesis and urinary symptoms as associated symptoms. She has a h/o cholecystectomy 6 or 7 years ago. She is a current everyday smoker but denies alcohol use.  Followed by Dr. Stefano Gaul   Past Medical History  Diagnosis Date  . Pancreatitis   . Peptic ulcer   . GERD (gastroesophageal reflux disease)   . Hiatal hernia   . Chronic back pain   . Chronic abdominal pain     Past Surgical History  Procedure Laterality Date  . Cholecystectomy    . Cesarean section    . Tubial ligation      Family History  Problem Relation Age of Onset  . Asthma Mother   . Heart failure Mother   . Cancer Mother   . Diabetes Mother   . Hypertension Mother   . Stroke Mother   . Heart failure Father   . Diabetes Father     History  Substance Use Topics  . Smoking status: Current Every Day Smoker -- 0.50 packs/day for 20 years    Types: Cigarettes  . Smokeless tobacco: Never Used  . Alcohol Use: No    OB History   Grav Para Term Preterm Abortions  TAB SAB Ect Mult Living   4 3 3  1  1   3       Review of Systems  Constitutional: Positive for chills. Negative for fever.  Gastrointestinal: Positive for nausea, vomiting and abdominal pain. Negative for diarrhea.  Genitourinary: Negative for dysuria, frequency and hematuria.  All other systems reviewed and are negative.    Allergies  Hydrocodone  Home Medications   Current Outpatient Rx  Name  Route  Sig  Dispense  Refill  . Ca Carbonate-Mag Hydroxide (ROLAIDS PO)   Oral   Take 3 tablets by mouth 3 (three) times daily.         Marland Kitchen ibuprofen (ADVIL,MOTRIN) 200 MG tablet   Oral   Take 800 mg by mouth daily as needed. For pain         . omeprazole (PRILOSEC) 20 MG capsule   Oral   Take 20 mg by mouth 2 (two) times daily.           Triage Vitals: BP 135/78  Pulse 88  Temp(Src) 97.9 F (36.6 C) (Oral)  Resp 24  Ht 5\' 7"  (1.702 m)  Wt 191 lb (86.637 kg)  BMI 29.91 kg/m2  SpO2 100%  LMP 02/09/2012  Physical Exam  Nursing note and vitals reviewed. Constitutional: She  is oriented to person, place, and time. She appears well-developed and well-nourished. No distress.  HENT:  Head: Normocephalic and atraumatic.  Eyes: EOM are normal.  Neck: Neck supple. No tracheal deviation present.  Cardiovascular: Normal rate and regular rhythm.   Pulmonary/Chest: Effort normal and breath sounds normal. No respiratory distress.  Abdominal: Soft. There is tenderness (mild epigastric tenderness to palpation ). There is no rebound and no guarding.  Musculoskeletal: Normal range of motion.  Neurological: She is alert and oriented to person, place, and time.  Skin: Skin is warm and dry.  Psychiatric: She has a normal mood and affect. Her behavior is normal.    ED Course  Procedures (including critical care time)  DIAGNOSTIC STUDIES: Oxygen Saturation is 100% on room air, normal by my interpretation.    COORDINATION OF CARE: 2:13 PM-Discussed treatment plan which includes  medications, CBC panel, CMP and UA with pt at bedside and pt agreed to plan.   Labs Reviewed - No data to display No results found.   No diagnosis found.    MDM  The patient presents with epigastric pain.  She has a history of pancreatitis and this feels the same.  She was given morphine and dilaudid and is not feeling much better.  The labs reveal and elevated wbc and lipase.  She is status post cholecystectomy and prior ct scans have not revealed any other causative pathology.  I will consult medicine for admission.        I personally performed the services described in this documentation, which was scribed in my presence. The recorded information has been reviewed and is accurate.      Geoffery Lyons, MD 03/25/13 [phone]

## 2013-03-25 NOTE — H&P (Signed)
[facility] History and Physical  Rita [name] ZOX:[id number] DOB: 27-Apr-1966 DOA: 03/25/2013  Referring physician: Dr Judd Lien PCP: [facility]    Chief Complaint: Abdominal pain  HPI: Rita [name] is a 47 y.o. female presents to the emergency room with epigastric pain radiating to the back, which started 2 AM this morning. It has been associated with nausea and vomiting. She does have a history of recurrent episodes of acute pancreatitis in the past. She denies heavy alcohol use. She does not know why she gets pancreatitis. When she was evaluated in the emergency room, her lipase was significantly elevated and we are asked to admit the patient.   Review of Systems:  Apart from history of present illness, other systems negative.  Past Medical History  Diagnosis Date  . Pancreatitis   . Peptic ulcer   . GERD (gastroesophageal reflux disease)   . Hiatal hernia   . Chronic back pain   . Chronic abdominal pain    Past Surgical History  Procedure Laterality Date  . Cholecystectomy    . Cesarean section    . Tubial ligation     Social History:  Divorced, 2 sons live with her. She denies alcohol use. She does smoke cigarettes. She works on a farm.  Allergies  Allergen Reactions  . Hydrocodone     Family History  Problem Relation Age of Onset  . Asthma Mother   . Heart failure Mother   . Cancer Mother   . Diabetes Mother   . Hypertension Mother   . Stroke Mother   . Heart failure Father   . Diabetes Father      Prior to Admission medications   Medication Sig Start Date End Date Taking? Authorizing Provider  Ca Carbonate-Mag Hydroxide (ROLAIDS PO) Take 3 tablets by mouth 3 (three) times daily.   Yes Historical Provider, MD  ibuprofen (ADVIL,MOTRIN) 200 MG tablet Take 800 mg by mouth daily as needed. For pain   Yes Historical Provider, MD  omeprazole (PRILOSEC) 20 MG capsule Take 20 mg by mouth 2 (two) times daily.   Yes Historical Provider, MD    Physical Exam: Filed Vitals:   03/25/13 1258  BP: 135/78  Pulse: 88  Temp: 97.9 F (36.6 C)  TempSrc: Oral  Resp: 24  Height: 5\' 7"  (1.702 m)  Weight: 86.637 kg (191 lb)  SpO2: 100%     General:  She looks systemically well. She is not toxic or septic.  Eyes: No pallor. No jaundice.  ENT: Unremarkable.  Neck: No lymphadenopathy.  Cardiovascular: Heart sounds are present and normal without murmurs or gallop rhythm.  Respiratory: Lung fields are clear.  Abdomen: Soft, not acutely tender. Bowel sounds scanty. No masses.  Skin: No rash.  Musculoskeletal: Unremarkable.  Psychiatric: Appropriate affect.  Neurologic: Alert and orientated without any focal neurological signs the  Labs on Admission:  Basic Metabolic Panel:  Recent Labs Lab 03/25/13 1426  NA 142  K 3.7  CL 105  CO2 24  GLUCOSE 102*  BUN 7  CREATININE 0.90  CALCIUM 9.6   Liver Function Tests:  Recent Labs Lab 03/25/13 1426  AST 15  ALT 18  ALKPHOS 64  BILITOT 0.2*  PROT 7.8  ALBUMIN 3.9    Recent Labs Lab 03/25/13 1426  LIPASE 590*    CBC:  Recent Labs Lab 03/25/13 1426  WBC 10.8*  NEUTROABS 8.0*  HGB 13.7  HCT 40.4  MCV 88.4  PLT 383    Radiological  Exams on Admission:     Assessment/Plan   1. Acute pancreatitis, unclear etiology.  Plan: 1. Admit. 2. Clear fluid diet. 3. Intravenous fluids. 4. Intravenous analgesia as required. Intravenous antiemetics as required. 5. CT scan of the abdomen. Further recommendations will depend on patient's hospital progress.   Code Status: Full code.  Family Communication: Discussed plan with patient at the bedside.   Disposition Plan: Home when medically stable.  Time spent: 30 minutes.  Wilson Singer [facility] Pager [phone]  If 7PM-7AM, please contact night-coverage [email or web address] Password [facility] 03/25/2013, 4:49 PM

## 2013-03-26 ENCOUNTER — Encounter (HOSPITAL_COMMUNITY): Payer: Self-pay | Admitting: Gastroenterology

## 2013-03-26 DIAGNOSIS — R1013 Epigastric pain: Secondary | ICD-10-CM

## 2013-03-26 DIAGNOSIS — R1310 Dysphagia, unspecified: Secondary | ICD-10-CM | POA: Diagnosis present

## 2013-03-26 DIAGNOSIS — F17200 Nicotine dependence, unspecified, uncomplicated: Secondary | ICD-10-CM

## 2013-03-26 DIAGNOSIS — K2270 Barrett's esophagus without dysplasia: Secondary | ICD-10-CM

## 2013-03-26 DIAGNOSIS — R748 Abnormal levels of other serum enzymes: Secondary | ICD-10-CM

## 2013-03-26 DIAGNOSIS — K219 Gastro-esophageal reflux disease without esophagitis: Secondary | ICD-10-CM | POA: Diagnosis present

## 2013-03-26 DIAGNOSIS — R1314 Dysphagia, pharyngoesophageal phase: Secondary | ICD-10-CM

## 2013-03-26 DIAGNOSIS — Z720 Tobacco use: Secondary | ICD-10-CM | POA: Diagnosis present

## 2013-03-26 DIAGNOSIS — R1319 Other dysphagia: Secondary | ICD-10-CM | POA: Diagnosis present

## 2013-03-26 DIAGNOSIS — M549 Dorsalgia, unspecified: Secondary | ICD-10-CM | POA: Diagnosis present

## 2013-03-26 DIAGNOSIS — G8929 Other chronic pain: Secondary | ICD-10-CM

## 2013-03-26 DIAGNOSIS — K209 Barrett's esophagus without dysplasia: Secondary | ICD-10-CM | POA: Diagnosis present

## 2013-03-26 HISTORY — DX: Barrett's esophagus without dysplasia: K22.70

## 2013-03-26 HISTORY — DX: Esophagitis, unspecified without bleeding: K20.90

## 2013-03-26 HISTORY — DX: Tobacco use: Z72.0

## 2013-03-26 LAB — COMPREHENSIVE METABOLIC PANEL
ALT: 34 U/L (ref 0–35)
Alkaline Phosphatase: 82 U/L (ref 39–117)
BUN: 6 mg/dL (ref 6–23)
CO2: 23 mEq/L (ref 19–32)
Chloride: 110 mEq/L (ref 96–112)
GFR calc Af Amer: >90 mL/min (ref >90)
GFR calc non Af Amer: 80 mL/min — ABNORMAL LOW (ref >90)
Glucose, Bld: 94 mg/dL (ref 70–99)
Potassium: 3.1 mEq/L — ABNORMAL LOW (ref 3.5–5.1)
Total Bilirubin: 0.2 mg/dL — ABNORMAL LOW (ref 0.3–1.2)

## 2013-03-26 LAB — CBC
MCHC: 33.8 g/dL (ref 30.0–36.0)
Platelets: 311 10*3/uL (ref 150–400)
RDW: 13.5 % (ref 11.5–15.5)
WBC: 8.1 10*3/uL (ref 4.0–10.5)

## 2013-03-26 MED ORDER — HYDROMORPHONE HCL PF 1 MG/ML IJ SOLN
1.0000 mg | INTRAMUSCULAR | Status: DC | PRN
  Administered 2013-03-26 – 2013-03-28 (×12): 1 mg via INTRAVENOUS
  Filled 2013-03-26 (×13): qty 1

## 2013-03-26 MED ORDER — PANTOPRAZOLE SODIUM 40 MG IV SOLR
40.0000 mg | Freq: Two times a day (BID) | INTRAVENOUS | Status: DC
  Administered 2013-03-26 – 2013-03-28 (×6): 40 mg via INTRAVENOUS
  Filled 2013-03-26 (×6): qty 40

## 2013-03-26 MED ORDER — PRO-STAT SUGAR FREE PO LIQD
30.0000 mL | Freq: Three times a day (TID) | ORAL | Status: DC
  Administered 2013-03-26 – 2013-03-27 (×2): 30 mL via ORAL
  Filled 2013-03-26 (×5): qty 30

## 2013-03-26 MED ORDER — BOOST / RESOURCE BREEZE PO LIQD
1.0000 | Freq: Three times a day (TID) | ORAL | Status: DC
  Administered 2013-03-26 – 2013-03-29 (×7): 1 via ORAL

## 2013-03-26 NOTE — Care Management Note (Signed)
    Page 1 of 1   03/29/2013     2:55:03 PM   CARE MANAGEMENT NOTE 03/29/2013  Patient:  Rita [name], Rita [name]   Account Number:  [id number]  Date Initiated:  03/26/2013  Documentation initiated by:  Rosemary Holms  Subjective/Objective Assessment:   Pt admitted from home where she lives with her son. Independent at home. Does not have a PCP but goes to [facility]. Has spoken to Kathie Rhodes, Artist     Action/Plan:   Anticipated DC Date:  03/29/2013   Anticipated DC Plan:  HOME/SELF CARE  In-house referral  Financial Counselor      DC Planning Services  CM consult  MATCH Program      Choice offered to / List presented to:             Status of service:  Completed, signed off Medicare Important Message given?  NA - LOS <3 / Initial given by admissions (If response is "NO", the following Medicare IM given date fields will be blank) Date Medicare IM given:   Date Additional Medicare IM given:    Discharge Disposition:  HOME/SELF CARE  Per UR Regulation:    If discussed at Long Length of Stay Meetings, dates discussed:    Comments:  03/26/13 Rosemary Holms RN BSN CM

## 2013-03-26 NOTE — Consult Note (Signed)
Primary Care Physician:  [facility] Primary Gastroenterologist:  Dr. Jena Gauss   Date of Admission:  03/25/13 Date of Consultation: 03/26/13  Reason for Consultation:  Elevated Lipase, Esophagitis   HPI:  Rita [name] is a pleasant 47 year old female with a history of Barrett's, last EGD in 2011 by Dr. Karilyn Cota. At that time, findings consistent with small ulcer in distal esophagus with soft stricture at GE junction s/p balloon dilation. Path consistent with Barrett's. She has a history of gallstone pancreatitis in the remote past, s/p cholecystectomy by Dr. Lovell Sheehan. She has had very mild bumps in lipase in the past year but no evidence of CT-documented pancreatitis. She presented on 3/31 with elevated lipase at 590, but no CT documentation of pancreatitis. Circumferential wall-thickening in distal esophagus noted. Today, lipase improved significantly.   Presented with epigastric pain, radiating towards back, worse in back "than it's ever been". N/V yesterday morning. No ETOH use. Feels hot and cold. States onset of pain yesterday morning, acute. No diarrhea. +black/tarry stool intermittently, noted last on Sunday. Takes Ibuprofen, Aleve, Advil, "something" at least daily. Works on a farm, Psychologist, sport and exercise. +dysphagia for several months. Feels like can't swallow her own saliva at times. Worse when anxious. Feels like an elephant sitting on her chest sometimes. Liquids hurt this morning going down. Macaroni gets stuck. "Anything". Decreased po intake, "scared it will get stuck". Takes Prilosec once daily.   Past Medical History  Diagnosis Date  . Pancreatitis   . Peptic ulcer   . GERD (gastroesophageal reflux disease)   . Hiatal hernia   . Chronic back pain   . Chronic abdominal pain   . Barrett's esophagus with esophagitis 03/26/2013  . Tobacco use 03/26/2013    Past Surgical History  Procedure Laterality Date  . Cholecystectomy    . Cesarean section    . Tubal ligation    .  Esophagogastroduodenoscopy  Oct 2011    Dr. Karilyn Cota: ulcer in distal esophagus, soft stricture at GE junction s/p balloon dilation PATH: BARRETT'S    Prior to Admission medications   Medication Sig Start Date End Date Taking? Authorizing Provider  Ca Carbonate-Mag Hydroxide (ROLAIDS PO) Take 3 tablets by mouth 3 (three) times daily.   Yes Historical Provider, MD  ibuprofen (ADVIL,MOTRIN) 200 MG tablet Take 800 mg by mouth daily as needed. For pain   Yes Historical Provider, MD  omeprazole (PRILOSEC) 20 MG capsule Take 20 mg by mouth 2 (two) times daily.   Yes Historical Provider, MD    Current Facility-Administered Medications  Medication Dose Route Frequency Provider Last Rate Last Dose  . 0.9 %  sodium chloride infusion   Intravenous Continuous Christiane Ha, MD 75 mL/hr at 03/26/13 0954    . HYDROmorphone (DILAUDID) injection 1 mg  1 mg Intravenous Q4H PRN Christiane Ha, MD   1 mg at 03/26/13 0811  . pantoprazole (PROTONIX) injection 40 mg  40 mg Intravenous Q12H Christiane Ha, MD   40 mg at 03/26/13 1039    Allergies as of 03/25/2013 - Review Complete 03/25/2013  Allergen Reaction Noted  . Hydrocodone  03/25/2013    Family History  Problem Relation Age of Onset  . Asthma Mother   . Heart failure Mother   . Cancer Mother   . Diabetes Mother   . Hypertension Mother   . Stroke Mother   . Heart failure Father   . Diabetes Father   . Colon cancer Neg Hx   . Pancreatic cancer Mother  deceased    History   Social History  . Marital Status: Divorced    Spouse Name: N/A    Number of Children: N/A  . Years of Education: N/A   Occupational History  . farm    Social History Main Topics  . Smoking status: Current Every Day Smoker -- 0.50 packs/day for 20 years    Types: Cigarettes  . Smokeless tobacco: Never Used  . Alcohol Use: No  . Drug Use: No  . Sexually Active: Not on file   Other Topics Concern  . Not on file   Social History Narrative  . No  narrative on file    Review of Systems: Gen: SEE HPI CV: +palpitations Resp: +DOE GI: SEE HPI GU : Denies urinary burning, urinary frequency, urinary incontinence.  MS: +joint pain Derm: Denies rash, itching, dry skin Psych: Denies depression, anxiety,confusion, or memory loss Heme: Denies bruising, bleeding, and enlarged lymph nodes.  Physical Exam: Vital signs in last 24 hours: Temp:  [97.4 F (36.3 C)-97.9 F (36.6 C)] 97.8 F (36.6 C) (04/01 0412) Pulse Rate:  [74-88] 74 (04/01 0412) Resp:  [22-24] 22 (04/01 0412) BP: (116-135)/(74-88) 116/74 mmHg (04/01 0412) SpO2:  [97 %-100 %] 97 % (04/01 0412) Weight:  [191 lb (86.637 kg)-196 lb 6.9 oz (89.1 kg)] 196 lb 6.9 oz (89.1 kg) (03/31 1803) Last BM Date: 03/26/13 General:   Alert,  Well-developed, well-nourished, pleasant and cooperative in NAD Head:  Normocephalic and atraumatic. Eyes:  Sclera clear, no icterus.   Conjunctiva pink. Ears:  Normal auditory acuity. Nose:  No deformity, discharge,  or lesions. Mouth:  No deformity or lesions, dentition normal. Neck:  Supple; no masses or thyromegaly. Lungs:  Clear throughout to auscultation.   No wheezes, crackles, or rhonchi. No acute distress. Heart:  Regular rate and rhythm; no murmurs, clicks, rubs,  or gallops. Abdomen:  Soft, very mild TTP epigastric region and nondistended. No masses, hepatosplenomegaly or hernias noted. Normal bowel sounds, without guarding, and without rebound.   Rectal:  Deferred  Msk:  Symmetrical without gross deformities. Normal posture. Extremities:  Without clubbing or edema. Neurologic:  Alert and  oriented x4;  grossly normal neurologically. Skin:  Intact without significant lesions or rashes. Cervical Nodes:  No significant cervical adenopathy. Psych:  Alert and cooperative. Normal mood and affect.  Intake/Output from previous day: 03/31 0701 - 04/01 0700 In: 1637.5 [I.V.:1637.5] Out: -  Intake/Output this shift: Total I/O In: 120  [P.O.:120] Out: -   Lab Results:  Recent Labs  03/25/13 1426 03/26/13 0520  WBC 10.8* 8.1  HGB 13.7 11.4*  HCT 40.4 33.7*  PLT 383 311   BMET  Recent Labs  03/25/13 1426 03/26/13 0520  NA 142 141  K 3.7 3.1*  CL 105 110  CO2 24 23  GLUCOSE 102* 94  BUN 7 6  CREATININE 0.90 0.85  CALCIUM 9.6 8.2*   LFT  Recent Labs  03/25/13 1426 03/26/13 0520  PROT 7.8 6.1  ALBUMIN 3.9 3.1*  AST 15 34  ALT 18 34  ALKPHOS 64 82  BILITOT 0.2* 0.2*   Lab Results  Component Value Date   LIPASE 128* 03/26/2013     Studies/Results: Ct Abdomen W Contrast  03/25/2013  *RADIOLOGY REPORT*  Clinical Data: Abdominal pain with nausea vomiting.  CT ABDOMEN WITH CONTRAST  Technique:  Multidetector CT imaging of the abdomen was performed following the standard protocol during bolus administration of intravenous contrast.  Contrast: 50mL OMNIPAQUE IOHEXOL 300 MG/ML  SOLN, OMNIPAQUE IOHEXOL 300 MG/ML  SOLN  Comparison: 10/23/2012  Findings: Mild circumferential wall thickening is seen in the distal esophagus.  The liver measures 24 cm in cranial caudal length, enlarged.  No focal abnormalities seen in the liver or spleen.  Stomach, duodenum, pancreas, and adrenal glands are unremarkable.  The gallbladder is surgically absent.  Small cortical cysts are seen in both kidneys.  The aorta is without aneurysm.  No free fluid or lymphadenopathy in the abdomen.  No small bowel dilatation to suggest obstruction.  Bone windows reveal no worrisome lytic or sclerotic osseous lesions.  IMPRESSION: Normal CT appearance of the pancreas.  Hepatomegaly  Circumferential wall thickening in the distal esophagus. Esophagitis or esophageal neoplasm could have this appearance.   Original Report Authenticated By: Kennith Center, M.D.     Impression: 47 year old female admitted with significantly elevated lipase of 590, normal LFTs, CT without evidence for pancreatitis. However, noted on CT was circumferential wall  thickening of distal esophagus. She has a history notable for Barrett's esophagus, with last EGD in Oct 2011 by Dr. Karilyn Cota. Dilation performed at that time due to soft stricture at GE junction. 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 Possible melena noted in recent past; also notes esophageal dysphagia/odynophagia with soft foods and liquids. In the setting of daily NSAID use, melena, known Barrett's, and CT findings, needs EGD tomorrow, 4/2. Further work-up of pancreas in the way of MRCP vs EUS as outpatient. Concern for PUD, severe esophagitis due to symptoms and presentation. May need dilation due to history of stricture in the past. The risks and benefits were discussed in detail with patient, and she is agreeable with proceeding.   Plan: Clear liquids today, NPO after midnight EGD with possible dilation if indicated with Dr. Jena Gauss on 4/2 Protonix BID Supportive measures Likely EUS as outpatient with Dr. Christella Hartigan (vs MRCP)   LOS: 1 day    03/26/2013, 12:04 PM

## 2013-03-26 NOTE — Progress Notes (Signed)
INITIAL NUTRITION ASSESSMENT  DOCUMENTATION CODES Per approved criteria  -Obesity Class I   INTERVENTION:  Resource Breeze po BID, each supplement provides 250 kcal and 9 grams of protein. ProStat 30 ml TID (each 30 ml provides 100 kcal, 15 gr protein)  NUTRITION DIAGNOSIS: Inadequate oral inatke related to altered GI function as evidenced by epigastric pain, abdominal distention and avoidance of foods due to GI symptoms.   Goal: Pt to meet >/= 90% of their estimated nutrition needs  Monitor:  Diet advancement/tolerance, po intake, labs and wt trends  Reason for Assessment: Malnurtriton Screen  47 y.o. female  Admitting Dx: Acute Esophagitis   ASSESSMENT: Pt has hx of Esphageal dysphagia, intermittent epigastric pain. Tolerating clear liquids today. She denies recent wt loss. In fact she has gain significant wt. 26# (11.8kg), 15% in six months.  GI team are following and plans for EGD, and possible EUS.  Oral supplement added pending diet advancement. She is reluctant to have diet advanced and says she tolerated the broth this morning but eating jello increased her abdominal pain. She is willing to "try" [employer].   Pt does not meet criteria for malnutrition at this time.   Height: Ht Readings from Last 1 Encounters:  03/25/13 5\' 7"  (1.702 m)    Weight: Wt Readings from Last 1 Encounters:  03/25/13 196 lb 6.9 oz (89.1 kg)    Ideal Body Weight: 135# (61.3 kg)  % Ideal Body Weight: 145%  Wt Readings from Last 10 Encounters:  03/25/13 196 lb 6.9 oz (89.1 kg)  10/23/12 170 lb (77.111 kg)  08/15/12 170 lb (77.111 kg)  05/01/12 170 lb (77.111 kg)  03/19/12 170 lb (77.111 kg)  03/18/12 170 lb (77.111 kg)  11/11/11 170 lb (77.111 kg)  10/24/11 170 lb (77.111 kg)  10/15/11 170 lb (77.111 kg)  10/08/11 170 lb (77.111 kg)    Usual Body Weight: 170#  % Usual Body Weight: 115%  BMI:  Body mass index is 30.76 kg/(m^2).Obesity Class I  Estimated Nutritional  Needs: Kcal: 1600-1900  Protein: 90-106 gr Fluid: >2000 ml/day  Skin: No issues noted  Diet Order: Clear Liquid  EDUCATION NEEDS: -Education not appropriate at this time   Intake/Output Summary (Last 24 hours) at 03/26/13 1332 Last data filed at 03/26/13 1256  Gross per 24 hour  Intake 1877.5 ml  Output      0 ml  Net 1877.5 ml    Last BM: 03/26/13 diarrhea, distended abdomen  Labs:   Recent Labs Lab 03/25/13 1426 03/26/13 0520  NA 142 141  K 3.7 3.1*  CL 105 110  CO2 24 23  BUN 7 6  CREATININE 0.90 0.85  CALCIUM 9.6 8.2*  GLUCOSE 102* 94    CBG (last 3)  No results found for this basename: GLUCAP,  in the last 72 hours  Scheduled Meds: . pantoprazole (PROTONIX) IV  40 mg Intravenous Q12H    Continuous Infusions: . sodium chloride 75 mL/hr at 03/26/13 9604    Past Medical History  Diagnosis Date  . Pancreatitis   . Peptic ulcer   . GERD (gastroesophageal reflux disease)   . Hiatal hernia   . Chronic back pain   . Chronic abdominal pain   . Barrett's esophagus with esophagitis 03/26/2013  . Tobacco use 03/26/2013    Past Surgical History  Procedure Laterality Date  . Cholecystectomy    . Cesarean section    . Tubal ligation    . Esophagogastroduodenoscopy  Oct 2011  Dr. Karilyn Cota: ulcer in distal esophagus, soft stricture at GE junction s/p balloon dilation PATH: BARRETT'S    Royann Shivers MS,RD,LDN,CSG Office: [phone] Pager: [phone]

## 2013-03-26 NOTE — Consult Note (Signed)
Patient seen and examined. Agree with above assessment and recommendations. Previously, patient did have changes on CT suspicious for pancreatitis. In 2013, she had a notable bump in aminotransferases during an attack. She has chronic stuttering intermittent epigastric pain which may or may not be related to her pancreas. Pending findings at upcoming EGD, I recommend this nice lady undergo an elective EUS to take a good look at her pancreas and to more closely image her biliary tree for possible occult stone disease.

## 2013-03-26 NOTE — Progress Notes (Addendum)
Chart reviewed.  Subjective: Complains of heartburn. Regurgitation symptoms. Has difficulty swallowing even soft foods, sometimes even saliva. No nausea currently. Would like to stick with clears for now. Takes ibuprofen daily for chronic back pain. 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 Takes omeprazole daily. Takes an entire roll of Rolaids daily recently. Has had endoscopy multiple times in the past, required dilatation. History of esophagitis and Barretts. Reports no alcohol intake whatsoever.  Objective: Vital signs in last 24 hours: Filed Vitals:   03/25/13 1258 03/25/13 1803 03/25/13 2115 03/26/13 0412  BP: 135/78 135/86 130/88 116/74  Pulse: 88 78 79 74  Temp: 97.9 F (36.6 C) 97.4 F (36.3 C) 97.8 F (36.6 C) 97.8 F (36.6 C)  TempSrc: Oral Oral Oral Oral  Resp: 24 22 22 22   Height: 5\' 7"  (1.702 m) 5\' 7"  (1.702 m)    Weight: 86.637 kg (191 lb) 89.1 kg (196 lb 6.9 oz)    SpO2: 100% 100% 99% 97%   Weight change:   Intake/Output Summary (Last 24 hours) at 03/26/13 0938 Last data filed at 03/26/13 0549  Gross per 24 hour  Intake 1637.5 ml  Output      0 ml  Net 1637.5 ml   General: Comfortable. Cooperative. Oriented. Lungs clear to auscultation bilaterally without wheeze rhonchi or rales Cardiovascular regular rate rhythm without murmurs gallops rubs Abdomen normal bowel sounds, soft, nontender, nondistended Extremities no clubbing cyanosis or edema  Lab Results: Basic Metabolic Panel:  Recent Labs Lab 03/25/13 1426 03/26/13 0520  NA 142 141  K 3.7 3.1*  CL 105 110  CO2 24 23  GLUCOSE 102* 94  BUN 7 6  CREATININE 0.90 0.85  CALCIUM 9.6 8.2*   Liver Function Tests:  Recent Labs Lab 03/25/13 1426 03/26/13 0520  AST 15 34  ALT 18 34  ALKPHOS 64 82  BILITOT 0.2* 0.2*  PROT 7.8 6.1  ALBUMIN 3.9 3.1*    Recent Labs Lab 03/25/13 1426 03/26/13 0520  LIPASE 590* 128*   No results found for this basename: AMMONIA,  in the last 168  hours CBC:  Recent Labs Lab 03/25/13 1426 03/26/13 0520  WBC 10.8* 8.1  NEUTROABS 8.0*  --   HGB 13.7 11.4*  HCT 40.4 33.7*  MCV 88.4 88.9  PLT 383 311     Recent Labs Lab 03/25/13 1720  COLORURINE YELLOW  LABSPEC 1.020  PHURINE 6.0  GLUCOSEU NEGATIVE  HGBUR NEGATIVE  BILIRUBINUR NEGATIVE  KETONESUR NEGATIVE  PROTEINUR NEGATIVE  UROBILINOGEN 0.2  NITRITE NEGATIVE  LEUKOCYTESUR NEGATIVE   Micro Results: No results found for this or any previous visit (from the past 240 hour(s)). Studies/Results: Ct Abdomen W Contrast  03/25/2013  *RADIOLOGY REPORT*  Clinical Data: Abdominal pain with nausea vomiting.  CT ABDOMEN WITH CONTRAST  Technique:  Multidetector CT imaging of the abdomen was performed following the standard protocol during bolus administration of intravenous contrast.  Contrast: 50mL OMNIPAQUE IOHEXOL 300 MG/ML  SOLN, OMNIPAQUE IOHEXOL 300 MG/ML  SOLN  Comparison: 10/23/2012  Findings: Mild circumferential wall thickening is seen in the distal esophagus.  The liver measures 24 cm in cranial caudal length, enlarged.  No focal abnormalities seen in the liver or spleen.  Stomach, duodenum, pancreas, and adrenal glands are unremarkable.  The gallbladder is surgically absent.  Small cortical cysts are seen in both kidneys.  The aorta is without aneurysm.  No free fluid or lymphadenopathy in the abdomen.  No small bowel dilatation to suggest  obstruction.  Bone windows reveal no worrisome lytic or sclerotic osseous lesions.  IMPRESSION: Normal CT appearance of the pancreas.  Hepatomegaly  Circumferential wall thickening in the distal esophagus. Esophagitis or esophageal neoplasm could have this appearance.   Original Report Authenticated By: Kennith Center, M.D.    Scheduled Meds: . heparin  5,000 Units Subcutaneous Q8H  . influenza  inactive virus vaccine  0.5 mL Intramuscular Tomorrow-1000  . pneumococcal 23 valent vaccine  0.5 mL Intramuscular Tomorrow-1000    Continuous Infusions: . sodium chloride 150 mL/hr at 03/26/13 0545   PRN Meds:.HYDROmorphone (DILAUDID) injection Assessment/Plan:    Acute esophagitis: Patient's abdomen is nontender. Her lipase is elevated, but CAT scan of the abdomen and pelvis shows no pancreatic inflammation. Suspect this is the main problem during this hospitalization. Continue clears. Add proton pump inhibitor. Patient describes fairly severe esophageal dysphagia. Will consult GI for endoscopy. Last endoscopy was by Dr. Dionicia Abler in 2011 which showed Barrett's esophagus and esophageal ulceration.    Elevated lipase: Pancreas on CAT scan normal. Clinical picture and CAT scan findings more consistent with esophagitis, likely from chronic reflux and NSAID use. Will add twice a day Protonix.    Esophageal dysphagia    Chronic back pain    GERD (gastroesophageal reflux disease)  Barrett's esophagus: Again, counseled against the use of NSAIDs.    Tobacco use, counseled against   LOS: 1 day   Shrihan Putt L 03/26/2013, 9:38 AM

## 2013-03-27 ENCOUNTER — Encounter (HOSPITAL_COMMUNITY)
Admission: EM | Disposition: A | Discharge status: Home / Self Care | Payer: Self-pay | Source: Home / Self Care | Attending: Internal Medicine

## 2013-03-27 ENCOUNTER — Encounter (HOSPITAL_COMMUNITY): Payer: Self-pay | Admitting: *Deleted

## 2013-03-27 DIAGNOSIS — K219 Gastro-esophageal reflux disease without esophagitis: Secondary | ICD-10-CM

## 2013-03-27 DIAGNOSIS — R933 Abnormal findings on diagnostic imaging of other parts of digestive tract: Secondary | ICD-10-CM

## 2013-03-27 DIAGNOSIS — K210 Gastro-esophageal reflux disease with esophagitis: Secondary | ICD-10-CM

## 2013-03-27 DIAGNOSIS — K222 Esophageal obstruction: Secondary | ICD-10-CM

## 2013-03-27 DIAGNOSIS — K2960 Other gastritis without bleeding: Secondary | ICD-10-CM

## 2013-03-27 DIAGNOSIS — D649 Anemia, unspecified: Secondary | ICD-10-CM | POA: Diagnosis not present

## 2013-03-27 DIAGNOSIS — E876 Hypokalemia: Secondary | ICD-10-CM | POA: Diagnosis not present

## 2013-03-27 DIAGNOSIS — R1310 Dysphagia, unspecified: Secondary | ICD-10-CM

## 2013-03-27 HISTORY — PX: ESOPHAGOGASTRODUODENOSCOPY (EGD) WITH ESOPHAGEAL DILATION: SHX5812

## 2013-03-27 SURGERY — ESOPHAGOGASTRODUODENOSCOPY (EGD) WITH ESOPHAGEAL DILATION
Anesthesia: Moderate Sedation

## 2013-03-27 MED ORDER — MIDAZOLAM HCL 5 MG/5ML IJ SOLN
INTRAMUSCULAR | Status: DC | PRN
  Administered 2013-03-27: 1 mg via INTRAVENOUS
  Administered 2013-03-27 (×3): 2 mg via INTRAVENOUS

## 2013-03-27 MED ORDER — ONDANSETRON HCL 4 MG/2ML IJ SOLN
INTRAMUSCULAR | Status: AC
  Filled 2013-03-27: qty 2

## 2013-03-27 MED ORDER — SODIUM CHLORIDE 0.9 % IV SOLN: INTRAVENOUS | Status: DC

## 2013-03-27 MED ORDER — PROMETHAZINE HCL 25 MG/ML IJ SOLN
INTRAMUSCULAR | Status: DC | PRN
  Administered 2013-03-27: 25 mg via INTRAVENOUS

## 2013-03-27 MED ORDER — SUCRALFATE 1 GM/10ML PO SUSP
1.0000 g | Freq: Three times a day (TID) | ORAL | Status: DC
  Administered 2013-03-27 – 2013-03-29 (×8): 1 g via ORAL
  Filled 2013-03-27 (×8): qty 10

## 2013-03-27 MED ORDER — POTASSIUM CHLORIDE 10 MEQ/100ML IV SOLN
10.0000 meq | INTRAVENOUS | Status: AC
  Administered 2013-03-27 (×2): 10 meq via INTRAVENOUS
  Filled 2013-03-27: qty 200

## 2013-03-27 MED ORDER — MEPERIDINE HCL 100 MG/ML IJ SOLN
INTRAMUSCULAR | Status: DC | PRN
  Administered 2013-03-27 (×3): 50 mg via INTRAVENOUS

## 2013-03-27 MED ORDER — STERILE WATER FOR IRRIGATION IR SOLN
Status: DC | PRN
  Administered 2013-03-27: 13:00:00

## 2013-03-27 MED ORDER — SODIUM CHLORIDE 0.9 % IJ SOLN
INTRAMUSCULAR | Status: AC
  Filled 2013-03-27: qty 10

## 2013-03-27 MED ORDER — PROMETHAZINE HCL 25 MG/ML IJ SOLN
INTRAMUSCULAR | Status: AC
  Filled 2013-03-27: qty 1

## 2013-03-27 MED ORDER — ONDANSETRON HCL 4 MG/2ML IJ SOLN
INTRAMUSCULAR | Status: DC | PRN
  Administered 2013-03-27: 4 mg via INTRAVENOUS

## 2013-03-27 MED ORDER — BUTAMBEN-TETRACAINE-BENZOCAINE 2-2-14 % EX AERO
INHALATION_SPRAY | CUTANEOUS | Status: DC | PRN
  Administered 2013-03-27: 2 via TOPICAL

## 2013-03-27 MED ORDER — MEPERIDINE HCL 100 MG/ML IJ SOLN
INTRAMUSCULAR | Status: AC
  Filled 2013-03-27: qty 2

## 2013-03-27 MED ORDER — MIDAZOLAM HCL 5 MG/5ML IJ SOLN
INTRAMUSCULAR | Status: AC
  Filled 2013-03-27: qty 10

## 2013-03-27 NOTE — Op Note (Signed)
[facility] [address] [place], [address]   ENDOSCOPY PROCEDURE REPORT  PATIENT: Rita [name], Rita [name]  MR#: [id number] BIRTHDATE: [birth year] , 46  yrs. old GENDER: Female ENDOSCOPIST: R.  Roetta Sessions, MD FACP FACG REFERRED BY:  Lilly Cove, M.D. PROCEDURE DATE:  03/27/2013 PROCEDURE:     EGD with Elease Hashimoto dilation followed by esophageal and gastric biopsy INDICATIONS:     Epigastric pain and esophageal dysphagia  INFORMED CONSENT:   The risks, benefits, limitations, alternatives and imponderables have been discussed.  The potential for biopsy, esophogeal dilation, etc. have also been reviewed.  Questions have been answered.  All parties agreeable.  Please see the history and physical in the medical record for more information.  MEDICATIONS:  Versed 7 mg IV and Demerol 150 mg IV in divided doses. Phenergan 25 mg IV. Zofran 4 mg IV. Cetacaine spray  DESCRIPTION OF PROCEDURE:   The EC-3890Li ([id number]) and EG-2990i ([id number])  endoscope was introduced through the mouth and advanced to the second portion of the duodenum without difficulty or limitations.  The mucosal surfaces were surveyed very carefully during advancement of the scope and upon withdrawal.  Retroflexion view of the proximal stomach and esophagogastric junction was performed.      FINDINGS:       Marked inflammatory changes of the esophagus extending halfway up the tubular esophagus characterized by extensive geographic and linear erosions with coalescing areas of ulceration in the distal esophagus producing a mild, soft non-critical.  tubular stricture in this segment. There was a "tongue"  of salmon-colored epithelium extending up 3 cm  from the EG junction. The total extent of this mucosal color change cannot be assessed due to the degree of inflammation.   The scope traversed the esophagus without any resistance, whatsoever. Stomach empty. 2 cm hiatal hernia. Antral erosions present. No  ulcer or infiltrating process. Patent pylorus. Normal first and second portion of the duodenum.  THERAPEUTIC / DIAGNOSTIC MANEUVERS PERFORMED:  The scope was removed. A 52 French Maloney dilator was passed to full insertion with only minimal resistance upon full insertion. A look back revealed the strictured area had been dilated with minimal bleeding or apparent complication.  Subsequently, biopsies of the "tongue" of salmon-colored epithelium in distal esophagus were biopsied for histologic study. Finally, biopsy of the eroded gastric mucosa were taken for histologic study.   COMPLICATIONS:  None  IMPRESSION:  Severe ulcerative/erosive reflux esophagitis with soft stricture  - status post dilation. Abnormal-appearing esophageal epithelium consistent with prior diagnosis of Barrett's esophagus.-Status post biopsy. Antral erosions-status post biopsy  RECOMMENDATIONS:  Continue PPI twice a day. Add Carafate suspension 4 times a day. Followup on pathology. Outpatient endoscopic ultrasound. Repeat EGD in approximately 3 months to reassess esophagus. I have paged Dr. Karilyn Cota to discuss.    _______________________________ R. Roetta Sessions, MD FACP [facility] eSigned:  R. Roetta Sessions, MD FACP [facility] 03/27/2013 2:07 PM     CC:  PATIENT NAME:  [name], [name] MR#: [id number]

## 2013-03-27 NOTE — Progress Notes (Addendum)
[facility] PROGRESS NOTE  Rita [name] ZOX:[id number] DOB: [birth year] DOA: 03/25/2013 PCP: [facility]  Assessment/Plan: Acute esophagitis: Patient's abdomen remains nontender. Unable to tolerate clears due to increase pain with po intake. Appreciate GI assistance. EGD scheduled for this am.  Continue proton pump inhibitor. Hx of  Barrett's esophagus and esophageal ulceration.   Elevated lipase: Pancreas on CAT scan normal. Clinical picture and CAT scan findings more consistent with esophagitis, likely from chronic reflux and NSAID use. Continue  twice a day Protonix.   Esophageal dysphagia: see #1. EGD today with possible dilatation.  Hypokalemia: likely related to decreased po intake. Will replete and recheck.  Anemia: normocytic. May be dilutional. Chart review indicates current level not too far from baseline. Concern given hx NSAID use and report of intermittent dark tarry stools. Will check FOBT. GI following  Chronic back pain: stable at baseline  GERD (gastroesophageal reflux disease): currently NPO for EGD. Continue PPI   Barrett's esophagus: Again, counseled against the use of NSAIDs.   Tobacco use, counseled against   Code Status: full Family Communication: Pt at bedside Disposition Plan: home when ready likely day or two  Consultants:  Dr Myra Rude  Procedures:  EGD 03/27/13  Antibiotics:  None  HPI/Subjective: Awake states "had a rough night". Complained of continued pain worse with po intake. Unable to tolerate clear liquids yesterday.  Objective: Filed Vitals:   03/26/13 0412 03/26/13 1455 03/26/13 2220 03/27/13 0548  BP: 116/74 122/82 139/87 128/93  Pulse: 74 71 81 76  Temp: 97.8 F (36.6 C) 98 F (36.7 C) 98.4 F (36.9 C) 97.9 F (36.6 C)  TempSrc: Oral Oral Oral Oral  Resp: 22 22 19 18   Height:      Weight:      SpO2: 97% 92% 100% 100%    Intake/Output Summary (Last 24 hours) at 03/27/13 0828 Last data filed at  03/26/13 2221  Gross per 24 hour  Intake   1651 ml  Output      0 ml  Net   1651 ml   Filed Weights   03/25/13 1258 03/25/13 1803  Weight: 86.637 kg (191 lb) 89.1 kg (196 lb 6.9 oz)    Exam:   General:  Somewhat uncomfortable appearing, well nourished   Cardiovascular: RRR no MGR no LE edema  Respiratory: normal effort BS clear bilaterally no wheeze/rhonchi  Abdomen:  round soft +BS non-tender to palpation.  Musculoskeletal: moves all extremities. No clubbing or cyanosis  Data Reviewed: Basic Metabolic Panel:  Recent Labs Lab 03/25/13 1426 03/26/13 0520  NA 142 141  K 3.7 3.1*  CL 105 110  CO2 24 23  GLUCOSE 102* 94  BUN 7 6  CREATININE 0.90 0.85  CALCIUM 9.6 8.2*   Liver Function Tests:  Recent Labs Lab 03/25/13 1426 03/26/13 0520  AST 15 34  ALT 18 34  ALKPHOS 64 82  BILITOT 0.2* 0.2*  PROT 7.8 6.1  ALBUMIN 3.9 3.1*    Recent Labs Lab 03/25/13 1426 03/26/13 0520  LIPASE 590* 128*   No results found for this basename: AMMONIA,  in the last 168 hours CBC:  Recent Labs Lab 03/25/13 1426 03/26/13 0520  WBC 10.8* 8.1  NEUTROABS 8.0*  --   HGB 13.7 11.4*  HCT 40.4 33.7*  MCV 88.4 88.9  PLT 383 311   Cardiac Enzymes: No results found for this basename: CKTOTAL, CKMB, CKMBINDEX, TROPONINI,  in the last 168 hours BNP (last 3 results) No results found  for this basename: PROBNP,  in the last 8760 hours CBG: No results found for this basename: GLUCAP,  in the last 168 hours  No results found for this or any previous visit (from the past 240 hour(s)).   Studies: Ct Abdomen W Contrast  03/25/2013  *RADIOLOGY REPORT*  Clinical Data: Abdominal pain with nausea vomiting.  CT ABDOMEN WITH CONTRAST  Technique:  Multidetector CT imaging of the abdomen was performed following the standard protocol during bolus administration of intravenous contrast.  Contrast: 50mL OMNIPAQUE IOHEXOL 300 MG/ML  SOLN, OMNIPAQUE IOHEXOL 300 MG/ML  SOLN  Comparison:  10/23/2012  Findings: Mild circumferential wall thickening is seen in the distal esophagus.  The liver measures 24 cm in cranial caudal length, enlarged.  No focal abnormalities seen in the liver or spleen.  Stomach, duodenum, pancreas, and adrenal glands are unremarkable.  The gallbladder is surgically absent.  Small cortical cysts are seen in both kidneys.  The aorta is without aneurysm.  No free fluid or lymphadenopathy in the abdomen.  No small bowel dilatation to suggest obstruction.  Bone windows reveal no worrisome lytic or sclerotic osseous lesions.  IMPRESSION: Normal CT appearance of the pancreas.  Hepatomegaly  Circumferential wall thickening in the distal esophagus. Esophagitis or esophageal neoplasm could have this appearance.   Original Report Authenticated By: Kennith Center, M.D.     Scheduled Meds: . feeding supplement  30 mL Oral TID WC  . feeding supplement  1 Container Oral TID BM  . pantoprazole (PROTONIX) IV  40 mg Intravenous Q12H   Continuous Infusions: . sodium chloride 75 mL/hr at 03/26/13 1517   Time spent: 30 minutes  [facility] M  [facility]  If 7PM-7AM, please contact night-coverage at [email or web address], password [facility] 03/27/2013, 8:28 AM  LOS: 2 days   Attending note  EGD note reviewed. Patient interviewed and examined. Agree with above. No ammended. Patient is still sedated post procedure. Will try a full liquid diet. Appreciate Dr. Luvenia Starch assistance.

## 2013-03-28 ENCOUNTER — Telehealth: Payer: Self-pay | Admitting: Gastroenterology

## 2013-03-28 DIAGNOSIS — K922 Gastrointestinal hemorrhage, unspecified: Secondary | ICD-10-CM

## 2013-03-28 DIAGNOSIS — K859 Acute pancreatitis without necrosis or infection, unspecified: Secondary | ICD-10-CM

## 2013-03-28 DIAGNOSIS — K210 Gastro-esophageal reflux disease with esophagitis: Secondary | ICD-10-CM

## 2013-03-28 LAB — BASIC METABOLIC PANEL
CO2: 22 mEq/L (ref 19–32)
Calcium: 8.6 mg/dL (ref 8.4–10.5)
Glucose, Bld: 108 mg/dL — ABNORMAL HIGH (ref 70–99)
Potassium: 3 mEq/L — ABNORMAL LOW (ref 3.5–5.1)
Sodium: 140 mEq/L (ref 135–145)

## 2013-03-28 LAB — CBC
Hemoglobin: 11.6 g/dL — ABNORMAL LOW (ref 12.0–15.0)
MCH: 29.7 pg (ref 26.0–34.0)
Platelets: 294 10*3/uL (ref 150–400)
RBC: 3.91 MIL/uL (ref 3.87–5.11)
WBC: 8 10*3/uL (ref 4.0–10.5)

## 2013-03-28 MED ORDER — HYDROMORPHONE HCL PF 1 MG/ML IJ SOLN
1.0000 mg | INTRAMUSCULAR | Status: DC | PRN
  Administered 2013-03-28 – 2013-03-29 (×7): 1 mg via INTRAVENOUS
  Filled 2013-03-28 (×7): qty 1

## 2013-03-28 MED ORDER — POTASSIUM CHLORIDE 10 MEQ/100ML IV SOLN
10.0000 meq | INTRAVENOUS | Status: AC
  Administered 2013-03-28 (×4): 10 meq via INTRAVENOUS
  Filled 2013-03-28: qty 400

## 2013-03-28 NOTE — Telephone Encounter (Signed)
Patient needs OV here in 10 weeks to schedule f/u EGD to verify ulcer healing and assess Barrett's.   Patient needs EUS with Dr. Rob Bunting for pancreatitis unknown etiology, ?occult stone, h/o abnormal LFTs. Please arrange to be done around 6 weeks from now.

## 2013-03-28 NOTE — Progress Notes (Addendum)
Subjective:  Patient c/o loose black stool the last couple of days. No dysphagia. Some upper abd pain and low back pain. Worse postprandially, currently on a full liquid diet. No vomiting. Has these symptoms every couple of months.   Objective: Vital signs in last 24 hours: Temp:  [97.3 F (36.3 C)-98.8 F (37.1 C)] 98.2 F (36.8 C) (04/03 0603) Pulse Rate:  [71-130] 71 (04/03 0603) Resp:  [13-23] 16 (04/03 0603) BP: (103-176)/(78-102) 138/79 mmHg (04/03 0603) SpO2:  [97 %-100 %] 98 % (04/03 0603) Last BM Date: 03/26/13 General:   Alert,  Well-developed, well-nourished, pleasant and cooperative in NAD Head:  Normocephalic and atraumatic. Eyes:  Sclera clear, no icterus.   Abdomen:  Soft, moderate epigastric tenderness, nondistended. No masses, hepatosplenomegaly or hernias noted. Normal bowel sounds, without guarding, and without rebound.   Extremities:  Without clubbing, deformity or edema. Neurologic:  Alert and  oriented x4;  grossly normal neurologically. Skin:  Intact without significant lesions or rashes. Psych:  Alert and cooperative. Normal mood and affect.  Intake/Output from previous day:   Intake/Output this shift:    Lab Results: CBC  Recent Labs  03/25/13 1426 03/26/13 0520 03/28/13 0502  WBC 10.8* 8.1 8.0  HGB 13.7 11.4* 11.6*  HCT 40.4 33.7* 34.0*  MCV 88.4 88.9 87.0  PLT 383 311 294   BMET  Recent Labs  03/25/13 1426 03/26/13 0520 03/28/13 0502  NA 142 141 140  K 3.7 3.1* 3.0*  CL 105 110 108  CO2 24 23 22   GLUCOSE 102* 94 108*  BUN 7 6 6   CREATININE 0.90 0.85 0.82  CALCIUM 9.6 8.2* 8.6   LFTs  Recent Labs  03/25/13 1426 03/26/13 0520  BILITOT 0.2* 0.2*  ALKPHOS 64 82  AST 15 34  ALT 18 34  PROT 7.8 6.1  ALBUMIN 3.9 3.1*    Recent Labs  03/25/13 1426 03/26/13 0520  LIPASE 590* 128*   PT/INR No results found for this basename: LABPROT, INR,  in the last 72 hours    Imaging Studies: Ct Abdomen W Contrast  03/25/2013   *RADIOLOGY REPORT*  Clinical Data: Abdominal pain with nausea vomiting.  CT ABDOMEN WITH CONTRAST  Technique:  Multidetector CT imaging of the abdomen was performed following the standard protocol during bolus administration of intravenous contrast.  Contrast: 50mL OMNIPAQUE IOHEXOL 300 MG/ML  SOLN, OMNIPAQUE IOHEXOL 300 MG/ML  SOLN  Comparison: 10/23/2012  Findings: Mild circumferential wall thickening is seen in the distal esophagus.  The liver measures 24 cm in cranial caudal length, enlarged.  No focal abnormalities seen in the liver or spleen.  Stomach, duodenum, pancreas, and adrenal glands are unremarkable.  The gallbladder is surgically absent.  Small cortical cysts are seen in both kidneys.  The aorta is without aneurysm.  No free fluid or lymphadenopathy in the abdomen.  No small bowel dilatation to suggest obstruction.  Bone windows reveal no worrisome lytic or sclerotic osseous lesions.  IMPRESSION: Normal CT appearance of the pancreas.  Hepatomegaly  Circumferential wall thickening in the distal esophagus. Esophagitis or esophageal neoplasm could have this appearance.   Original Report Authenticated By: Kennith Center, M.D.   [2 weeks]   Assessment: 47 y/o female admitted with elevated lipase of 590, normal LFTs, CT without evidence for pancreatitis. CT showed circumferential wall thickening of distal esophagus. H/O Barrrett's esophagus. Takes NSAIDS daily. ?recent melena, c/o esophageal dysphagia/odynophagia. EGD showed severe ulcerative/erosive reflux esophagitis with soft peptic stricture s/p dilation. Antral erosions s/p bx.  Barrett's s/p bx. She could have melena related this this. H/H stable last 24 hours hours with some drop since admission in part dilutional.  H/O pancreatitis in past. CT 08/2011 s/o pancreatitis. H/O AST/ALT in 400/500s in 09/2012, unexplained. ?occult stone as etiology of pancreatitis.     Plan: 1. PPI BID. 2. Carafate. 3. F/u path. 4. EGD 3 months.  5. No  NSAIDS. 6. Outpatient EUS to further evaluate pancreas and biliary tract for occult stone. 7. H/H AM. 8. Continue full liquids for now.   LOS: 3 days   Tana Coast  03/28/2013, 7:55 AM   Still with some epigastric pain radiating into the back but overall improved. Swallowing better. Would consider advancing her diet tomorrow depending on clinical progress. I would leave her on Carafate for a month and continue twice a day proton pump inhibitor therapy until she returns to see [place] in about 3 months. Agree with plans for outpatient EUS.

## 2013-03-28 NOTE — Telephone Encounter (Signed)
Forwarding Referral to Chales Abrahams with Dr. Christella Hartigan

## 2013-03-28 NOTE — Progress Notes (Signed)
[facility] PROGRESS NOTE  OKTOBER GLAZER ZOX:[id number] DOB: 1966/02/11 DOA: 03/25/2013 [facility]CP: [facility]  Assessment/Plan: Acute esophagitis: S/P EGD with dilation followed by esophageal and gastric biopsy on 03/27/13. Impression is severe ulcerative/erosive reflux esophagitis with soft stricture.  Appreciate GI assistance.  Continue proton pump inhibitor and carafate. Pt currently having some difficulty tolerating full liquids. Recommended room temperature foods.    Elevated lipase: Pancreas on CAT scan normal. Clinical picture and CAT scan findings more consistent with esophagitis, likely from chronic reflux and NSAID use. Continue twice a day Protonix.   Esophageal dysphagia: see #1.    Hypokalemia: likely related to decreased po intake. Will replete with IV potassium given #1. BMET in am   Anemia: normocytic. Stable at 11.4-13. No s/sx bleeding. Monitor   Chronic back pain: stable at baseline   GERD (gastroesophageal reflux disease): See #1. PPI and carafate   Barrett's esophagus: Again, counseled against the use of NSAIDs.   Tobacco use, counseled against      Code Status: full Family Communication:  Disposition Plan: home when ready. Hopefully in 24 hours   Consultants:  Dr Jena Gauss GI  Procedures:  EGD 4.2.14  Antibiotics:  none  HPI/Subjective: Awake alert trying to eat pudding/jello. Reports continued pain with eating.   Objective: Filed Vitals:   03/27/13 1350 03/27/13 1408 03/27/13 2042 03/28/13 0603  BP: 103/82 138/95 126/81 138/79  Pulse: 98 86 76 71  Temp:  97.3 F (36.3 C) 98.8 F (37.1 C) 98.2 F (36.8 C)  TempSrc:   Oral Oral  Resp: 15 17 16 16   Height:      Weight:      SpO2: 99% 98% 99% 98%   No intake or output data in the 24 hours ending 03/28/13 1029 Filed Weights   03/25/13 1258 03/25/13 1803  Weight: 86.637 kg (191 lb) 89.1 kg (196 lb 6.9 oz)    Exam:   General:  Awake alert NAD  Cardiovascular: RRR  No murmur gallup. No LE edema  Respiratory: normal effort BS clear bilaterally. No wheeze no rhonchii  Abdomen: round soft non-tender to palpation. +BS throughout  Musculoskeletal: no clubbing or cyanosis   Data Reviewed: Basic Metabolic Panel:  Recent Labs Lab 03/25/13 1426 03/26/13 0520 03/28/13 0502  NA 142 141 140  K 3.7 3.1* 3.0*  CL 105 110 108  CO2 24 23 22   GLUCOSE 102* 94 108*  BUN 7 6 6   CREATININE 0.90 0.85 0.82  CALCIUM 9.6 8.2* 8.6   Liver Function Tests:  Recent Labs Lab 03/25/13 1426 03/26/13 0520  AST 15 34  ALT 18 34  ALKPHOS 64 82  BILITOT 0.2* 0.2*  PROT 7.8 6.1  ALBUMIN 3.9 3.1*    Recent Labs Lab 03/25/13 1426 03/26/13 0520  LIPASE 590* 128*   No results found for this basename: AMMONIA,  in the last 168 hours CBC:  Recent Labs Lab 03/25/13 1426 03/26/13 0520 03/28/13 0502  WBC 10.8* 8.1 8.0  NEUTROABS 8.0*  --   --   HGB 13.7 11.4* 11.6*  HCT 40.4 33.7* 34.0*  MCV 88.4 88.9 87.0  PLT 383 311 294   Cardiac Enzymes: No results found for this basename: CKTOTAL, CKMB, CKMBINDEX, TROPONINI,  in the last 168 hours BNP (last 3 results) No results found for this basename: PROBNP,  in the last 8760 hours CBG: No results found for this basename: GLUCAP,  in the last 168 hours  No results found for this or any  previous visit (from the past 240 hour(s)).   Studies: No results found.  Scheduled Meds: . feeding supplement  30 mL Oral TID WC  . feeding supplement  1 Container Oral TID BM  . pantoprazole (PROTONIX) IV  40 mg Intravenous Q12H  . potassium chloride  10 mEq Intravenous Q1 Hr x 4  . sucralfate  1 g Oral TID WC & HS   Continuous Infusions: . sodium chloride 75 mL/hr at 03/27/13 1411   Time spent: 30 minutes  [facility] M  [facility]  If 7PM-7AM, please contact night-coverage at [email or web address], password [facility] 03/28/2013, 10:29 AM  LOS: 3 days   Attending note  Patient feels a little better, able to  tolerate small amounts of full liquid diet, but still having significant pain with swallowing. Will monitor her intake  Crista Curb, M.D.

## 2013-03-29 LAB — BASIC METABOLIC PANEL
Chloride: 107 mEq/L (ref 96–112)
GFR calc Af Amer: 87 mL/min — ABNORMAL LOW (ref >90)
GFR calc non Af Amer: 75 mL/min — ABNORMAL LOW (ref >90)
Glucose, Bld: 91 mg/dL (ref 70–99)
Potassium: 2.7 mEq/L — CL (ref 3.5–5.1)
Sodium: 143 mEq/L (ref 135–145)

## 2013-03-29 LAB — MAGNESIUM: Magnesium: 1.7 mg/dL (ref 1.5–2.5)

## 2013-03-29 LAB — HEMOGLOBIN AND HEMATOCRIT, BLOOD: HCT: 33.8 % — ABNORMAL LOW (ref 36.0–46.0)

## 2013-03-29 MED ORDER — PANTOPRAZOLE SODIUM 40 MG PO TBEC
40.0000 mg | DELAYED_RELEASE_TABLET | Freq: Two times a day (BID) | ORAL | Status: DC
  Administered 2013-03-29: 40 mg via ORAL
  Filled 2013-03-29: qty 1

## 2013-03-29 MED ORDER — HYDROCODONE-ACETAMINOPHEN 5-325 MG PO TABS: 2.0000 | ORAL_TABLET | Freq: Four times a day (QID) | ORAL | Status: DC | PRN

## 2013-03-29 MED ORDER — SUCRALFATE 1 GM/10ML PO SUSP: 1.0000 g | Freq: Three times a day (TID) | ORAL | Status: DC

## 2013-03-29 MED ORDER — HYDROCODONE-ACETAMINOPHEN 5-325 MG PO TABS
2.0000 | ORAL_TABLET | Freq: Four times a day (QID) | ORAL | Status: DC | PRN
  Administered 2013-03-29: 2 via ORAL
  Filled 2013-03-29: qty 2

## 2013-03-29 MED ORDER — POTASSIUM CHLORIDE 10 MEQ/100ML IV SOLN
10.0000 meq | INTRAVENOUS | Status: AC
  Administered 2013-03-29 (×2): 10 meq via INTRAVENOUS
  Filled 2013-03-29: qty 100
  Filled 2013-03-29: qty 200

## 2013-03-29 MED ORDER — PANCRELIPASE (LIP-PROT-AMYL) 12000-38000 UNITS PO CPEP
3.0000 | ORAL_CAPSULE | Freq: Three times a day (TID) | ORAL | Status: DC
  Administered 2013-03-29: 3 via ORAL
  Filled 2013-03-29: qty 3

## 2013-03-29 MED ORDER — POTASSIUM CHLORIDE CRYS ER 20 MEQ PO TBCR
40.0000 meq | EXTENDED_RELEASE_TABLET | Freq: Two times a day (BID) | ORAL | Status: DC
  Administered 2013-03-29: 40 meq via ORAL
  Filled 2013-03-29: qty 2

## 2013-03-29 MED ORDER — PRO-STAT SUGAR FREE PO LIQD: 30.0000 mL | Freq: Three times a day (TID) | ORAL | Status: DC

## 2013-03-29 MED ORDER — BOOST / RESOURCE BREEZE PO LIQD: 1.0000 | Freq: Three times a day (TID) | ORAL | Status: DC

## 2013-03-29 MED ORDER — PANCRELIPASE (LIP-PROT-AMYL) 12000-38000 UNITS PO CPEP: 3.0000 | ORAL_CAPSULE | Freq: Three times a day (TID) | ORAL | Status: DC

## 2013-03-29 MED ORDER — PANTOPRAZOLE SODIUM 40 MG PO TBEC: 40.0000 mg | DELAYED_RELEASE_TABLET | Freq: Two times a day (BID) | ORAL | Status: DC

## 2013-03-29 NOTE — Telephone Encounter (Signed)
See alternate note  

## 2013-03-29 NOTE — Progress Notes (Signed)
CRITICAL VALUE ALERT  Critical value received:  Potassium 2.7  Date of notification:  03/29/13  Time of notification:  0625  Critical value read back:yes  Nurse who received alert:  Linward Natal, RN  MD notified (1st page):  Phillips Odor  Time of first page:  0626  MD notified (2nd page):  Time of second page:  Responding MD:  Phillips Odor  Time MD responded:   Doctor put in new orders for patient

## 2013-03-29 NOTE — Telephone Encounter (Signed)
Patty, She needs upper eus, radial +/- linear, 60 min, ++MAC, for pancreatitis, elevated liver tests.  Should be about 6 weeks from now on EUS Thursday.  Thanks

## 2013-03-29 NOTE — Telephone Encounter (Signed)
Toya Smothers NP left message on voice mail and would like to see if the pt has been scheduled.  [phone]

## 2013-03-29 NOTE — Progress Notes (Signed)
Subjective:  Overall feeling better. No BM 24 hours. No pp abd pain so far this morning. Did have some epigastric and back pain during night. Wants to go home. No N/V.  Objective: Vital signs in last 24 hours: Temp:  [98 F (36.7 C)-98.4 F (36.9 C)] 98.4 F (36.9 C) (04/04 0551) Pulse Rate:  [62-84] 70 (04/04 0551) Resp:  [16] 16 (04/04 0551) BP: (151-159)/(78-94) 151/94 mmHg (04/04 0551) SpO2:  [98 %-100 %] 99 % (04/04 0551) Last BM Date: 03/28/13 General:   Alert,  Well-developed, well-nourished, pleasant and cooperative in NAD Head:  Normocephalic and atraumatic. Eyes:  Sclera clear, no icterus.   Abdomen:  Soft, mild epigastric tenderness and nondistended. No masses, hepatosplenomegaly or hernias noted. Normal bowel sounds, without guarding, and without rebound.   Extremities:  Without clubbing, deformity or edema. Neurologic:  Alert and  oriented x4;  grossly normal neurologically. Skin:  Intact without significant lesions or rashes. Psych:  Alert and cooperative. Normal mood and affect.  Intake/Output from previous day: 04/03 0701 - 04/04 0700 In: 2303.8 [I.V.:1903.8; IV Piggyback:400] Out: -  Intake/Output this shift:    Lab Results: CBC  Recent Labs  03/28/13 0502 03/29/13 0507  WBC 8.0  --   HGB 11.6* 11.7*  HCT 34.0* 33.8*  MCV 87.0  --   PLT 294  --    BMET  Recent Labs  03/28/13 0502 03/29/13 0507  NA 140 143  K 3.0* 2.7*  CL 108 107  CO2 22 24  GLUCOSE 108* 91  BUN 6 5*  CREATININE 0.82 0.90  CALCIUM 8.6 8.7     Imaging Studies: Ct Abdomen W Contrast  03/25/2013  *RADIOLOGY REPORT*  Clinical Data: Abdominal pain with nausea vomiting.  CT ABDOMEN WITH CONTRAST  Technique:  Multidetector CT imaging of the abdomen was performed following the standard protocol during bolus administration of intravenous contrast.  Contrast: 50mL OMNIPAQUE IOHEXOL 300 MG/ML  SOLN, OMNIPAQUE IOHEXOL 300 MG/ML  SOLN  Comparison: 10/23/2012  Findings: Mild  circumferential wall thickening is seen in the distal esophagus.  The liver measures 24 cm in cranial caudal length, enlarged.  No focal abnormalities seen in the liver or spleen.  Stomach, duodenum, pancreas, and adrenal glands are unremarkable.  The gallbladder is surgically absent.  Small cortical cysts are seen in both kidneys.  The aorta is without aneurysm.  No free fluid or lymphadenopathy in the abdomen.  No small bowel dilatation to suggest obstruction.  Bone windows reveal no worrisome lytic or sclerotic osseous lesions.  IMPRESSION: Normal CT appearance of the pancreas.  Hepatomegaly  Circumferential wall thickening in the distal esophagus. Esophagitis or esophageal neoplasm could have this appearance.   Original Report Authenticated By: Kennith Center, M.D.   [2 weeks]   Assessment: 47 y/o female admitted with elevated lipase of 590, normal LFTs, CT without evidence of pancreatitis. She has h/o Barrett's, daily NSAIDs. EGD showed severe ulcerative/erosive reflux esophagitis with soft peptic stricture s/p dilation. Antral erosions s/p bx. Barrett's s/p bx.   H/O pancreatitis in past and bump in AST/ALT last year. ?occult stone. EUS as outpatient planned.  H/H stable.    Plan: 1. PPI BID until next EGD. 2. Carafate X 1 month. 3. EGD 3 months. 4. No NSAIDS. 5. Outpatient EUS, in process of being arranged. 6. F/U path. 7. Advance to low-fat diet and add pancreatic enzymes short-term.   LOS: 4 days   Tana Coast  03/29/2013, 7:47 AM

## 2013-03-29 NOTE — Plan of Care (Signed)
Problem: Discharge Progression Outcomes Goal: Discharge plan in place and appropriate Outcome: Completed/Met Date Met:  03/29/13 D/c instructions given to pt pt verbalized understanding of instructions. Prescriptions given to pt

## 2013-03-29 NOTE — Discharge Summary (Signed)
Physician Discharge Summary  Rita [name] WUX:[id number] DOB: 08-May-1966 DOA: 03/25/2013  PCP: [facility]  Admit date: 03/25/2013 Discharge date: 03/29/2013  Time spent: 40 minutes  Recommendations for Outpatient Follow-up:  1. Follow up with health department in 1 week. Recommend BMET to trend potassium  2. Dr. Christella Hartigan with GI office will call to schedule EUS for 6 weeks from now. Will also need EGD in 3 months Discharge Diagnoses:  Principal Problem:   Barrett's esophagus with esophagitis Active Problems:   Elevated lipase   Esophageal dysphagia   Chronic back pain   GERD (gastroesophageal reflux disease)   Tobacco use   Hypokalemia   Anemia   Discharge Condition: stable  Diet recommendation: bland  Filed Weights   03/25/13 1258 03/25/13 1803  Weight: 86.637 kg (191 lb) 89.1 kg (196 lb 6.9 oz)    History of present illness:  Rita [name] is a 47 y.o. female presented to the emergency room 03/25/13 with epigastric pain radiating to the back, which started 2 AM that morning. It had been associated with nausea and vomiting. Pt with hxe a history of recurrent episodes of acute pancreatitis in the past. She denied heavy alcohol use. She did not know why she gets pancreatitis. When she was evaluated in the emergency room, her lipase was significantly elevated and [facility] was asked to admit   Hospital Course:  Acute esophagitis:  Pt admitted. Unable to tolerate clears due to increase pain with po intake. Seen by GI and EGD done 03/27/13. EGD with dilation of esophageal and gastric biopsy. Impression is severe ulcerative/erosive reflux esophagitis with soft stricture. Hx of Barrett's esophagus and esophageal ulceration. At discharge able to tolerate bland diet and manage pain with po pain med. Will continue PPI and carafate at discharge. Instructed to avoid NSAIDS. Follow up  with Dr. Gerilyn Pilgrim whose office will contact.  Elevated lipase: Pancreas on CAT scan normal.  Clinical picture and CAT scan findings more consistent with esophagitis, likely from chronic reflux and NSAID use. Continue twice a day Protonix. Hx of pancreatitis. Seen by GI who recommend EUS as OP, pancreatic enzymes. Concern for occult stone.    Esophageal dysphagia: see #1.    Hypokalemia: likely related to decreased po intake. Given IV on day of discharge. Level 2.7. Avoiding po replacement. Recommend BMET 1 week to track. Suspect will resolve when po intake reliable.  Anemia: normocytic. Stable at 11.6. No s/sx bleeding.  Chronic back pain: stable at baseline   GERD (gastroesophageal reflux disease):PPI and carafate Barrett's esophagus: Again, counseled against the use of NSAIDs.   Tobacco use, counseled against      Procedures:  EGD 03/27/13  Consultations:  GI  Discharge Exam: Filed Vitals:   03/28/13 0603 03/28/13 1500 03/28/13 2042 03/29/13 0551  BP: 138/79 159/78 154/91 151/94  Pulse: 71 62 84 70  Temp: 98.2 F (36.8 C) 98.2 F (36.8 C) 98 F (36.7 C) 98.4 F (36.9 C)  TempSrc: Oral Oral    Resp: 16 16 16 16   Height:      Weight:      SpO2: 98% 98% 100% 99%    General: awake alert NAD Cardiovascular: RRR No MGR No LE edema Respiratory: normal effort BS clear no wheeze Abdomen: round soft +BS non tender    Discharge Instructions  Discharge Orders   Future Orders Complete By Expires     Call MD for:  persistant nausea and vomiting  As directed     Call  MD for:  temperature >100.4  As directed     Diet - low sodium heart healthy  As directed     Increase activity slowly  As directed         Medication List    STOP taking these medications       ibuprofen 200 MG tablet  Commonly known as:  ADVIL,MOTRIN     omeprazole 20 MG capsule  Commonly known as:  PRILOSEC     ROLAIDS PO      TAKE these medications       feeding supplement Liqd  Take 30 mLs by mouth 3 (three) times daily with meals.     HYDROcodone-acetaminophen 5-325 MG per  tablet  Commonly known as:  NORCO/VICODIN  Take 2 tablets by mouth every 6 (six) hours as needed.     lipase/protease/amylase [address] UNITS Cpep  Commonly known as:  CREON-10/PANCREASE  Take 3 capsules by mouth 3 (three) times daily before meals.     pantoprazole 40 MG tablet  Commonly known as:  PROTONIX  Take 1 tablet (40 mg total) by mouth 2 (two) times daily before a meal.     sucralfate 1 GM/10ML suspension  Commonly known as:  CARAFATE  Take 10 mLs (1 g total) by mouth 4 (four) times daily -  with meals and at bedtime.           Follow-up Information   Follow up with [facility]. Schedule an appointment as soon as possible for a visit in 1 week. (will need BMET.)    Contact information:   [address] [place] [address] [phone]       Follow up with Rob Bunting, MD. Call in 2 weeks. (office in process of scheduling follow up . )    Contact information:   [address] [place] [address] [phone]        The results of significant diagnostics from this hospitalization (including imaging, microbiology, ancillary and laboratory) are listed below for reference.    Significant Diagnostic Studies: Ct Abdomen W Contrast  03/25/2013  *RADIOLOGY REPORT*  Clinical Data: Abdominal pain with nausea vomiting.  CT ABDOMEN WITH CONTRAST  Technique:  Multidetector CT imaging of the abdomen was performed following the standard protocol during bolus administration of intravenous contrast.  Contrast: 50mL OMNIPAQUE IOHEXOL 300 MG/ML  SOLN, OMNIPAQUE IOHEXOL 300 MG/ML  SOLN  Comparison: 10/23/2012  Findings: Mild circumferential wall thickening is seen in the distal esophagus.  The liver measures 24 cm in cranial caudal length, enlarged.  No focal abnormalities seen in the liver or spleen.  Stomach, duodenum, pancreas, and adrenal glands are unremarkable.  The gallbladder is surgically absent.  Small cortical cysts are seen in both kidneys.  The aorta  is without aneurysm.  No free fluid or lymphadenopathy in the abdomen.  No small bowel dilatation to suggest obstruction.  Bone windows reveal no worrisome lytic or sclerotic osseous lesions.  IMPRESSION: Normal CT appearance of the pancreas.  Hepatomegaly  Circumferential wall thickening in the distal esophagus. Esophagitis or esophageal neoplasm could have this appearance.   Original Report Authenticated By: Kennith Center, M.D.     Microbiology: No results found for this or any previous visit (from the past 240 hour(s)).   Labs: Basic Metabolic Panel:  Recent Labs Lab 03/25/13 1426 03/26/13 0520 03/28/13 0502 03/29/13 0507  NA 142 141 140 143  K 3.7 3.1* 3.0* 2.7*  CL 105 110 108 107  CO2  24 23 22 24   GLUCOSE 102* 94 108* 91  BUN 7 6 6  5*  CREATININE 0.90 0.85 0.82 0.90  CALCIUM 9.6 8.2* 8.6 8.7  MG  --   --   --  1.7   Liver Function Tests:  Recent Labs Lab 03/25/13 1426 03/26/13 0520  AST 15 34  ALT 18 34  ALKPHOS 64 82  BILITOT 0.2* 0.2*  PROT 7.8 6.1  ALBUMIN 3.9 3.1*    Recent Labs Lab 03/25/13 1426 03/26/13 0520  LIPASE 590* 128*   No results found for this basename: AMMONIA,  in the last 168 hours CBC:  Recent Labs Lab 03/25/13 1426 03/26/13 0520 03/28/13 0502 03/29/13 0507  WBC 10.8* 8.1 8.0  --   NEUTROABS 8.0*  --   --   --   HGB 13.7 11.4* 11.6* 11.7*  HCT 40.4 33.7* 34.0* 33.8*  MCV 88.4 88.9 87.0  --   PLT 383 311 294  --    Cardiac Enzymes: No results found for this basename: CKTOTAL, CKMB, CKMBINDEX, TROPONINI,  in the last 168 hours BNP: BNP (last 3 results) No results found for this basename: PROBNP,  in the last 8760 hours CBG: No results found for this basename: GLUCAP,  in the last 168 hours  Signed:  Gwenyth Bender  [facility] 03/29/2013, 2:00 PM  Crista Curb, M.D.

## 2013-03-29 NOTE — Progress Notes (Signed)
REVIEWED.  

## 2013-03-31 ENCOUNTER — Encounter: Payer: Self-pay | Admitting: Internal Medicine

## 2013-04-01 ENCOUNTER — Other Ambulatory Visit: Payer: Self-pay

## 2013-04-01 ENCOUNTER — Encounter (HOSPITAL_COMMUNITY): Payer: Self-pay | Admitting: Internal Medicine

## 2013-04-01 DIAGNOSIS — K859 Acute pancreatitis without necrosis or infection, unspecified: Secondary | ICD-10-CM

## 2013-04-01 NOTE — Telephone Encounter (Signed)
Pt has been scheduled and will need to be instructed and meds reviewed.  Also a call was placed again to Toya Smothers NP and a message was left

## 2013-04-01 NOTE — Addendum Note (Signed)
Addended by: Donata Duff on: 04/01/2013 09:49 AM   Modules accepted: Orders

## 2013-04-01 NOTE — Telephone Encounter (Signed)
Left message on machine to call back  

## 2013-04-02 NOTE — Telephone Encounter (Signed)
No answer and no voice mail available today

## 2013-04-03 NOTE — Telephone Encounter (Signed)
Left message on machine to call back  

## 2013-04-03 NOTE — Telephone Encounter (Signed)
Referring is aware that the pt is scheduled but I have been unable to reach her by phone.  I also have notified her that the instructions have been mailed to the pt's home

## 2013-04-15 ENCOUNTER — Encounter: Payer: Self-pay | Admitting: Internal Medicine

## 2013-05-07 IMAGING — CR DG ABDOMEN ACUTE W/ 1V CHEST
3 series · 3 of 3 positions shown · non-contrast
Comparison: 08/15/2012

CLINICAL DATA: Abdominal pain

ACUTE ABDOMEN SERIES (ABDOMEN 2 VIEW & CHEST 1 VIEW)

[view not recorded (1 of 3)]
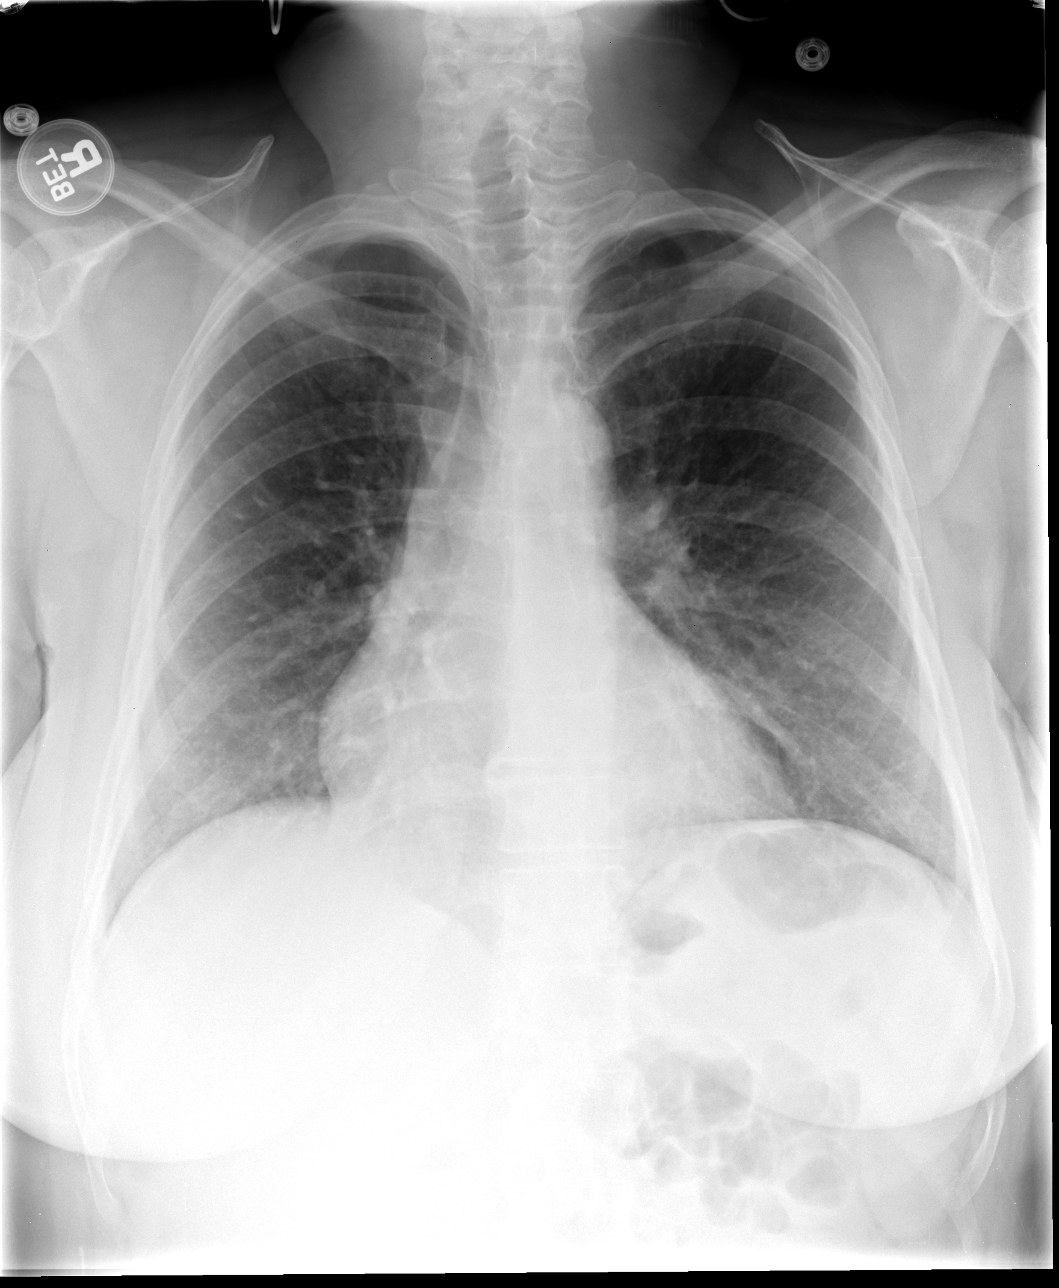

[view not recorded (2 of 3)]
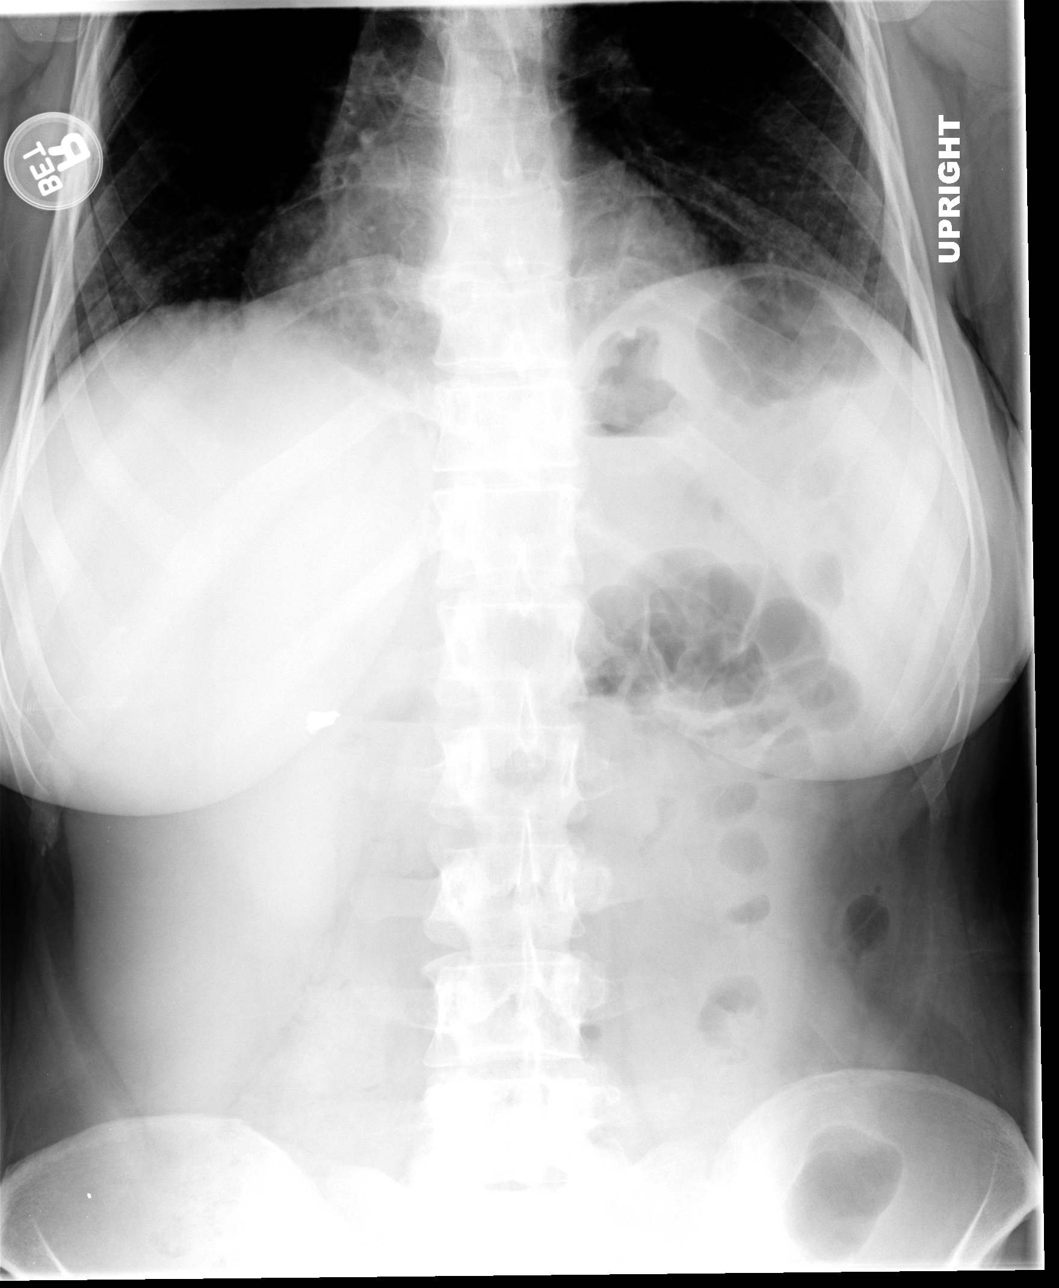

[view not recorded (3 of 3)]
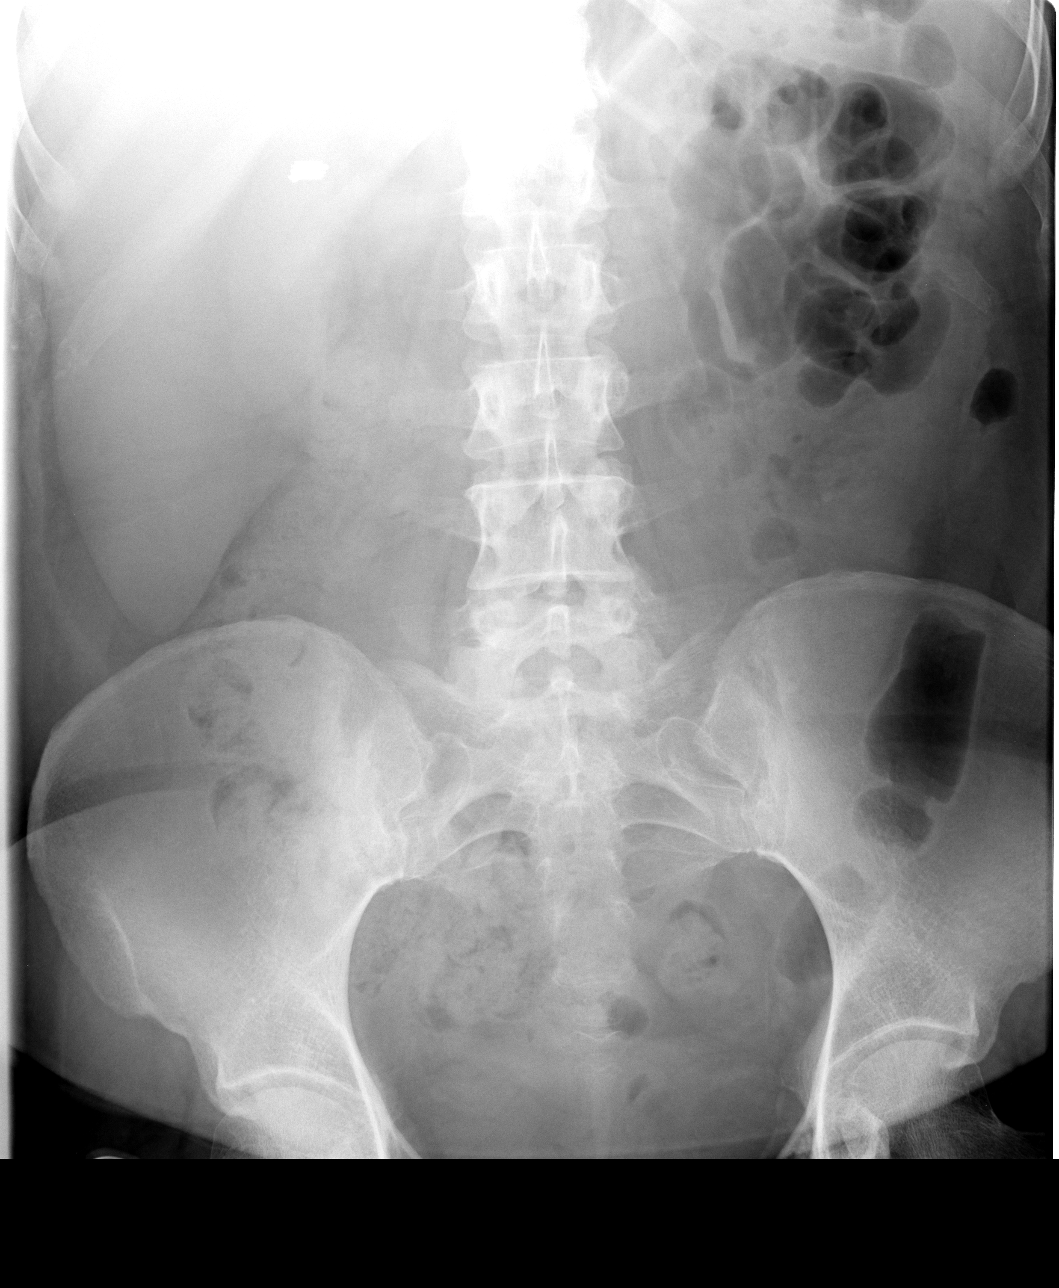

[3 of 3 positions shown; findings below may reference images not displayed]

FINDINGS: Heart size upper limits normal for technique.  Prominent
bibasilar interstitial markings persist.  No effusion.  No free
air.  Vascular clips in the right upper abdomen.  Small bowel is
decompressed.  Normal distribution of gas and stool throughout the
colon.  No abnormal abdominal calcifications.  Regional bones
unremarkable.
IMPRESSION: 1.  No acute cardiopulmonary disease.
2.  Nonobstructive bowel gas pattern.
3.  No free air.

## 2013-05-09 ENCOUNTER — Encounter (HOSPITAL_COMMUNITY)
Admission: RE | Disposition: A | Discharge status: Home / Self Care | Payer: Self-pay | Source: Ambulatory Visit | Attending: Gastroenterology

## 2013-05-09 ENCOUNTER — Ambulatory Visit (HOSPITAL_COMMUNITY): Payer: Self-pay | Admitting: Anesthesiology

## 2013-05-09 ENCOUNTER — Ambulatory Visit (HOSPITAL_COMMUNITY)
Admission: RE | Admit: 2013-05-09 | Discharge: 2013-05-09 | Disposition: A | Discharge status: Home / Self Care | Payer: Self-pay | Source: Ambulatory Visit | Attending: Gastroenterology | Admitting: Gastroenterology

## 2013-05-09 ENCOUNTER — Encounter (HOSPITAL_COMMUNITY): Payer: Self-pay

## 2013-05-09 ENCOUNTER — Encounter (HOSPITAL_COMMUNITY): Payer: Self-pay | Admitting: Anesthesiology

## 2013-05-09 DIAGNOSIS — K449 Diaphragmatic hernia without obstruction or gangrene: Secondary | ICD-10-CM | POA: Insufficient documentation

## 2013-05-09 DIAGNOSIS — Z9089 Acquired absence of other organs: Secondary | ICD-10-CM | POA: Insufficient documentation

## 2013-05-09 DIAGNOSIS — R7402 Elevation of levels of lactic acid dehydrogenase (LDH): Secondary | ICD-10-CM | POA: Insufficient documentation

## 2013-05-09 DIAGNOSIS — K861 Other chronic pancreatitis: Secondary | ICD-10-CM | POA: Insufficient documentation

## 2013-05-09 DIAGNOSIS — R7401 Elevation of levels of liver transaminase levels: Secondary | ICD-10-CM | POA: Insufficient documentation

## 2013-05-09 DIAGNOSIS — R748 Abnormal levels of other serum enzymes: Secondary | ICD-10-CM | POA: Insufficient documentation

## 2013-05-09 DIAGNOSIS — R7989 Other specified abnormal findings of blood chemistry: Secondary | ICD-10-CM

## 2013-05-09 DIAGNOSIS — G709 Myoneural disorder, unspecified: Secondary | ICD-10-CM | POA: Insufficient documentation

## 2013-05-09 DIAGNOSIS — Z885 Allergy status to narcotic agent status: Secondary | ICD-10-CM | POA: Insufficient documentation

## 2013-05-09 DIAGNOSIS — K859 Acute pancreatitis without necrosis or infection, unspecified: Secondary | ICD-10-CM

## 2013-05-09 DIAGNOSIS — F17200 Nicotine dependence, unspecified, uncomplicated: Secondary | ICD-10-CM | POA: Insufficient documentation

## 2013-05-09 DIAGNOSIS — Z8711 Personal history of peptic ulcer disease: Secondary | ICD-10-CM | POA: Insufficient documentation

## 2013-05-09 DIAGNOSIS — K2270 Barrett's esophagus without dysplasia: Secondary | ICD-10-CM | POA: Insufficient documentation

## 2013-05-09 DIAGNOSIS — K2100 Gastro-esophageal reflux disease with esophagitis, without bleeding: Secondary | ICD-10-CM | POA: Insufficient documentation

## 2013-05-09 HISTORY — PX: EUS: SHX5427

## 2013-05-09 LAB — POCT I-STAT 4, (NA,K, GLUC, HGB,HCT)
Glucose, Bld: 126 mg/dL — ABNORMAL HIGH (ref 70–99)
HCT: 38 % (ref 36.0–46.0)
Hemoglobin: 12.9 g/dL (ref 12.0–15.0)

## 2013-05-09 SURGERY — UPPER ENDOSCOPIC ULTRASOUND (EUS) LINEAR
Anesthesia: Monitor Anesthesia Care

## 2013-05-09 MED ORDER — PROPOFOL INFUSION 10 MG/ML OPTIME
INTRAVENOUS | Status: DC | PRN
  Administered 2013-05-09: 120 ug/kg/min via INTRAVENOUS

## 2013-05-09 MED ORDER — MIDAZOLAM HCL 5 MG/5ML IJ SOLN
INTRAMUSCULAR | Status: DC | PRN
  Administered 2013-05-09: 2 mg via INTRAVENOUS

## 2013-05-09 MED ORDER — LACTATED RINGERS IV SOLN
INTRAVENOUS | Status: DC
  Administered 2013-05-09: 1000 mL via INTRAVENOUS

## 2013-05-09 MED ORDER — SODIUM CHLORIDE 0.9 % IV SOLN: INTRAVENOUS | Status: DC

## 2013-05-09 MED ORDER — BUTAMBEN-TETRACAINE-BENZOCAINE 2-2-14 % EX AERO
INHALATION_SPRAY | CUTANEOUS | Status: DC | PRN
  Administered 2013-05-09: 1 via TOPICAL

## 2013-05-09 MED ORDER — KETAMINE HCL 10 MG/ML IJ SOLN
INTRAMUSCULAR | Status: DC | PRN
  Administered 2013-05-09 (×4): 10 mg via INTRAVENOUS

## 2013-05-09 NOTE — Op Note (Addendum)
[facility] [address] [place], [address]   ENDOSCOPIC ULTRASOUND PROCEDURE REPORT  PATIENT: Rita [name], Rita [name]  MR#: [id number] BIRTHDATE: 1966-07-14  GENDER: Female ENDOSCOPIST: Rachael Fee, MD REFERRED BY:  Roetta Sessions, M.D. PROCEDURE DATE:  05/09/2013 PROCEDURE:   Upper EUS ASA CLASS:      Class II INDICATIONS:   chronic abd pain; remote lap chole; recent admission for more acute pain with very elevated pancreatic enzymes but normal pancreatic imaging (CT).  Elevated transaminases 09/2012 but normal transaminses last month. Rare etoh drinker MEDICATIONS: MAC sedation, administered by CRNA  DESCRIPTION OF PROCEDURE:   After the risks benefits and alternatives of the procedure were  explained, informed consent was obtained. The patient was then placed in the left, lateral, decubitus postion and IV sedation was administered. Throughout the procedure, the patients blood pressure, pulse and oxygen saturations were monitored continuously.  Under direct visualization, the EUS scope [id number]  endoscope was introduced through the mouth  and advanced to the second portion of the duodenum .  Water was used as necessary to provide an acoustic interface.  Upon completion of the imaging, water was removed and the patient was sent to the recovery room in satisfactory condition.   Endoscopic findings (limited views with radial echoendoscope): 1. Distal esophagitis 2. Large amount of liquid, solid food in stomach 3. Otherwise normal UGI tract  EUS findings: 1. Pancreatic parenchyma was normal throughout the gland; no discrete lesions and no signs of chronic pancreatitis 2. Main pancreatic duct was normal; non-dilated; pancreatic divisum was ruled out during this exam 3. CBD was normal; non-dilated and contained no stones 4. Gallbladder surgically absent 5. No perpancreatic adenopathy 6. Limited views of liver, spleen, portal and splenic vessels were all  normal  Impression: Pancreas and CBD were normal.  Large amount of liquid and solid food in stomach without anatomic gastric outlet obstruction suggests signficant gastroparesis. This may contribute to her chronic abd pains.   _______________________________ eSignedRachael Fee, MD 05/09/2013 9:04 AM Revised: 05/09/2013 9:04 AM

## 2013-05-09 NOTE — H&P (Signed)
  HPI: This is a woman with recurrent pancreatitis; interesting that enzymes were very elevated during episode last month but pancreas was normal on CT.  LFTs normal last month, but transaminases very elevated 5-6 months ago.  Rare drinker, previous cholecystectomy.    Past Medical History  Diagnosis Date  . Pancreatitis   . Peptic ulcer   . GERD (gastroesophageal reflux disease)   . Hiatal hernia   . Chronic back pain   . Chronic abdominal pain   . Barrett's esophagus with esophagitis 03/26/2013  . Tobacco use 03/26/2013    Past Surgical History  Procedure Laterality Date  . Cholecystectomy    . Cesarean section    . Tubal ligation    . Esophagogastroduodenoscopy  Oct 2011    Dr. Karilyn Cota: ulcer in distal esophagus, soft stricture at GE junction s/p balloon dilation PATH: BARRETT'S  . Esophagogastroduodenoscopy (egd) with esophageal dilation N/A 03/27/2013    Procedure: ESOPHAGOGASTRODUODENOSCOPY (EGD) WITH ESOPHAGEAL DILATION;  Surgeon: Corbin Ade, MD;  Location: [facility];  Service: Endoscopy;  Laterality: N/A;  possible dilation    No current facility-administered medications for this encounter.    Allergies as of 04/01/2013 - Review Complete 03/27/2013  Allergen Reaction Noted  . Hydrocodone  03/25/2013    Family History  Problem Relation Age of Onset  . Asthma Mother   . Heart failure Mother   . Cancer Mother   . Diabetes Mother   . Hypertension Mother   . Stroke Mother   . Heart failure Father   . Diabetes Father   . Colon cancer Neg Hx   . Pancreatic cancer Mother     deceased    History   Social History  . Marital Status: Divorced    Spouse Name: N/A    Number of Children: N/A  . Years of Education: N/A   Occupational History  . farm    Social History Main Topics  . Smoking status: Current Every Day Smoker -- 0.50 packs/day for 20 years    Types: Cigarettes  . Smokeless tobacco: Never Used  . Alcohol Use: No  . Drug Use: No  . Sexually  Active: Not on file   Other Topics Concern  . Not on file   Social History Narrative  . No narrative on file      Physical Exam: LMP 02/09/2012 Constitutional: generally well-appearing Psychiatric: alert and oriented x3 Abdomen: soft, nontender, nondistended, no obvious ascites, no peritoneal signs, normal bowel sounds     Assessment and plan: 47 y.o. female with recurrent pancreatitis  For upper EUS today

## 2013-05-09 NOTE — Anesthesia Preprocedure Evaluation (Addendum)
Anesthesia Evaluation  Patient identified by MRN, date of birth, ID band Patient awake    Reviewed: Allergy & Precautions, H&P , NPO status , Patient's Chart, lab work & pertinent test results  Airway       Dental   Pulmonary Current Smoker,          Cardiovascular Exercise Tolerance: Good negative cardio ROS   ECG normal 05-01-12   Neuro/Psych Chronic back pain.  Neuromuscular disease negative psych ROS   GI/Hepatic Neg liver ROS, hiatal hernia, PUD, GERD-  Medicated,H/O pancreatitis, abdominal pain.   Endo/Other  negative endocrine ROS  Renal/GU K 2.7 on 03-29-13  negative genitourinary   Musculoskeletal negative musculoskeletal ROS (+)   Abdominal   Peds negative pediatric ROS (+)  Hematology negative hematology ROS (+)   Anesthesia Other Findings   Reproductive/Obstetrics S/P tubal ligation.                           Anesthesia Physical Anesthesia Plan  ASA: III  Anesthesia Plan: MAC   Post-op Pain Management:    Induction: Intravenous  Airway Management Planned:   Additional Equipment:   Intra-op Plan:   Post-operative Plan:   Informed Consent: I have reviewed the patients History and Physical, chart, labs and discussed the procedure including the risks, benefits and alternatives for the proposed anesthesia with the patient or authorized representative who has indicated his/her understanding and acceptance.   Dental advisory given  Plan Discussed with: CRNA  Anesthesia Plan Comments: (Repeat K 3.3 today.)       Anesthesia Quick Evaluation

## 2013-05-09 NOTE — Transfer of Care (Signed)
Immediate Anesthesia Transfer of Care Note  Patient: Rita [name]  Procedure(s) Performed: Procedure(s): UPPER ENDOSCOPIC ULTRASOUND (EUS) LINEAR (N/A)  Patient Location: PACU and Endoscopy Unit  Anesthesia Type:MAC  Level of Consciousness: awake, alert , oriented and patient cooperative  Airway & Oxygen Therapy: Patient Spontanous Breathing and Patient connected to nasal cannula oxygen  Post-op Assessment: Report given to PACU RN, Post -op Vital signs reviewed and stable and Patient moving all extremities X 4  Post vital signs: Reviewed and stable  Complications: No apparent anesthesia complications

## 2013-05-09 NOTE — Anesthesia Postprocedure Evaluation (Signed)
  Anesthesia Post-op Note  Patient: Rita [name]  Procedure(s) Performed: Procedure(s) (LRB): UPPER ENDOSCOPIC ULTRASOUND (EUS) LINEAR (N/A)  Patient Location: PACU  Anesthesia Type: MAC  Level of Consciousness: awake and alert   Airway and Oxygen Therapy: Patient Spontanous Breathing  Post-op Pain: mild  Post-op Assessment: Post-op Vital signs reviewed, Patient's Cardiovascular Status Stable, Respiratory Function Stable, Patent Airway and No signs of Nausea or vomiting  Last Vitals:  Filed Vitals:   05/09/13 0934  BP: 147/74  Pulse:   Temp:   Resp: 19    Post-op Vital Signs: stable   Complications: No apparent anesthesia complications. Denies SOB. Oxygen saturations have been good on room air.

## 2013-05-12 ENCOUNTER — Encounter (HOSPITAL_COMMUNITY): Payer: Self-pay | Admitting: Gastroenterology

## 2013-07-23 ENCOUNTER — Emergency Department (HOSPITAL_COMMUNITY)
Admission: EM | Admit: 2013-07-23 | Discharge: 2013-07-23 | Disposition: A | Discharge status: Home / Self Care | Payer: Self-pay | Attending: Emergency Medicine | Admitting: Emergency Medicine

## 2013-07-23 ENCOUNTER — Emergency Department (HOSPITAL_COMMUNITY): Payer: Self-pay

## 2013-07-23 ENCOUNTER — Encounter (HOSPITAL_COMMUNITY): Payer: Self-pay | Admitting: *Deleted

## 2013-07-23 DIAGNOSIS — Z9851 Tubal ligation status: Secondary | ICD-10-CM | POA: Insufficient documentation

## 2013-07-23 DIAGNOSIS — Z9089 Acquired absence of other organs: Secondary | ICD-10-CM | POA: Insufficient documentation

## 2013-07-23 DIAGNOSIS — Z8711 Personal history of peptic ulcer disease: Secondary | ICD-10-CM | POA: Insufficient documentation

## 2013-07-23 DIAGNOSIS — E876 Hypokalemia: Secondary | ICD-10-CM

## 2013-07-23 DIAGNOSIS — G8929 Other chronic pain: Secondary | ICD-10-CM

## 2013-07-23 DIAGNOSIS — Z8719 Personal history of other diseases of the digestive system: Secondary | ICD-10-CM | POA: Insufficient documentation

## 2013-07-23 DIAGNOSIS — R109 Unspecified abdominal pain: Secondary | ICD-10-CM | POA: Insufficient documentation

## 2013-07-23 DIAGNOSIS — F17200 Nicotine dependence, unspecified, uncomplicated: Secondary | ICD-10-CM | POA: Insufficient documentation

## 2013-07-23 DIAGNOSIS — Z3202 Encounter for pregnancy test, result negative: Secondary | ICD-10-CM | POA: Insufficient documentation

## 2013-07-23 LAB — URINE MICROSCOPIC-ADD ON

## 2013-07-23 LAB — CBC WITH DIFFERENTIAL/PLATELET
Basophils Relative: 0 % (ref 0–1)
Eosinophils Absolute: 0.2 10*3/uL (ref 0.0–0.7)
HCT: 41.6 % (ref 36.0–46.0)
Hemoglobin: 14.3 g/dL (ref 12.0–15.0)
MCH: 29.4 pg (ref 26.0–34.0)
MCHC: 34.4 g/dL (ref 30.0–36.0)
Monocytes Absolute: 0.6 10*3/uL (ref 0.1–1.0)
Monocytes Relative: 6 % (ref 3–12)
RDW: 13.6 % (ref 11.5–15.5)

## 2013-07-23 LAB — PREGNANCY, URINE: Preg Test, Ur: NEGATIVE

## 2013-07-23 LAB — COMPREHENSIVE METABOLIC PANEL
Albumin: 3.9 g/dL (ref 3.5–5.2)
BUN: 14 mg/dL (ref 6–23)
Calcium: 9.1 mg/dL (ref 8.4–10.5)
Creatinine, Ser: 0.96 mg/dL (ref 0.50–1.10)
Total Protein: 7.9 g/dL (ref 6.0–8.3)

## 2013-07-23 LAB — URINALYSIS, ROUTINE W REFLEX MICROSCOPIC
Bilirubin Urine: NEGATIVE
Ketones, ur: NEGATIVE mg/dL
Nitrite: NEGATIVE
Protein, ur: NEGATIVE mg/dL
pH: 6 (ref 5.0–8.0)

## 2013-07-23 LAB — LIPASE, BLOOD: Lipase: 75 U/L — ABNORMAL HIGH (ref 11–59)

## 2013-07-23 MED ORDER — POTASSIUM CHLORIDE 20 MEQ/15ML (10%) PO LIQD
40.0000 meq | Freq: Once | ORAL | Status: AC
  Administered 2013-07-23: 40 meq via ORAL
  Filled 2013-07-23: qty 30

## 2013-07-23 MED ORDER — FAMOTIDINE IN NACL 20-0.9 MG/50ML-% IV SOLN
20.0000 mg | Freq: Once | INTRAVENOUS | Status: AC
  Administered 2013-07-23: 20 mg via INTRAVENOUS
  Filled 2013-07-23: qty 50

## 2013-07-23 MED ORDER — POTASSIUM CHLORIDE 10 MEQ/100ML IV SOLN
10.0000 meq | Freq: Once | INTRAVENOUS | Status: AC
  Administered 2013-07-23: 10 meq via INTRAVENOUS
  Filled 2013-07-23: qty 100

## 2013-07-23 MED ORDER — ONDANSETRON HCL 4 MG/2ML IJ SOLN
4.0000 mg | INTRAMUSCULAR | Status: AC | PRN
  Administered 2013-07-23 (×2): 4 mg via INTRAVENOUS
  Filled 2013-07-23 (×2): qty 2

## 2013-07-23 MED ORDER — IOHEXOL 300 MG/ML  SOLN
50.0000 mL | Freq: Once | INTRAMUSCULAR | Status: AC | PRN
  Administered 2013-07-23: 50 mL via ORAL

## 2013-07-23 MED ORDER — PANTOPRAZOLE SODIUM 40 MG IV SOLR
40.0000 mg | Freq: Once | INTRAVENOUS | Status: AC
  Administered 2013-07-23: 40 mg via INTRAVENOUS
  Filled 2013-07-23: qty 40

## 2013-07-23 MED ORDER — FENTANYL CITRATE 0.05 MG/ML IJ SOLN
50.0000 ug | INTRAMUSCULAR | Status: DC | PRN
  Administered 2013-07-23: 50 ug via INTRAVENOUS
  Filled 2013-07-23: qty 2

## 2013-07-23 MED ORDER — OXYCODONE-ACETAMINOPHEN 5-325 MG PO TABS: ORAL_TABLET | ORAL | Status: DC

## 2013-07-23 MED ORDER — ONDANSETRON HCL 4 MG PO TABS: 4.0000 mg | ORAL_TABLET | Freq: Three times a day (TID) | ORAL | Status: DC | PRN

## 2013-07-23 MED ORDER — POTASSIUM CHLORIDE ER 10 MEQ PO TBCR
10.0000 meq | EXTENDED_RELEASE_TABLET | Freq: Two times a day (BID) | ORAL | Status: DC
Start: 2013-07-23 — End: 2013-09-04

## 2013-07-23 MED ORDER — FENTANYL CITRATE 0.05 MG/ML IJ SOLN
50.0000 ug | INTRAMUSCULAR | Status: AC | PRN
  Administered 2013-07-23 (×2): 50 ug via INTRAVENOUS
  Filled 2013-07-23 (×2): qty 2

## 2013-07-23 MED ORDER — SODIUM CHLORIDE 0.9 % IV SOLN
INTRAVENOUS | Status: DC
  Administered 2013-07-23: 18:00:00 via INTRAVENOUS

## 2013-07-23 MED ORDER — IOHEXOL 300 MG/ML  SOLN
100.0000 mL | Freq: Once | INTRAMUSCULAR | Status: AC | PRN
  Administered 2013-07-23: 100 mL via INTRAVENOUS

## 2013-07-23 NOTE — ED Notes (Addendum)
Drank 8 oz of potassium/orange juice mix w/o nausea.

## 2013-07-23 NOTE — ED Notes (Signed)
CRITICAL VALUE ALERT  Critical value received: K+ 2.5  Date of notification: 07/23/2013  Time of notification:  1910  Critical value read back: yes  Nurse who received alert:  Tiana Loft, RN  MD notified:  Dr. Clarene Duke aware

## 2013-07-23 NOTE — ED Notes (Signed)
abd swelling with pain/n/v radiating to back starting today.

## 2013-07-23 NOTE — ED Provider Notes (Signed)
CSN: [id number]     Arrival date & time 07/23/13  1654 History     First MD Initiated Contact with Patient 07/23/13 1721     Chief Complaint  Patient presents with  . Abdominal Pain    HPI Pt was seen at 1725.   Per pt, c/o gradual onset and persistence of constant acute flair of her chronic upper abd "pain" since this morning.  Has been associated with multiple intermittent episodes of N/V.  Describes the abd pain as "aching," and per her usual chronic pain pattern for the past several years. States she has not been taking her GI medications as instructed "for a while." Denies diarrhea, no fevers, no back pain, no rash, no CP/SOB, no black or blood in stools or emesis.      GI: Dr. Christella Hartigan Past Medical History  Diagnosis Date  . Pancreatitis   . Peptic ulcer   . GERD (gastroesophageal reflux disease)   . Hiatal hernia   . Chronic back pain   . Chronic abdominal pain   . Barrett's esophagus with esophagitis 03/26/2013  . Tobacco use 03/26/2013   Past Surgical History  Procedure Laterality Date  . Cholecystectomy    . Cesarean section    . Tubal ligation    . Esophagogastroduodenoscopy  Oct 2011    Dr. Karilyn Cota: ulcer in distal esophagus, soft stricture at GE junction s/p balloon dilation PATH: BARRETT'S  . Esophagogastroduodenoscopy (egd) with esophageal dilation N/A 03/27/2013    Procedure: ESOPHAGOGASTRODUODENOSCOPY (EGD) WITH ESOPHAGEAL DILATION;  Surgeon: Corbin Ade, MD;  Location: [facility];  Service: Endoscopy;  Laterality: N/A;  possible dilation  . Eus N/A 05/09/2013    Procedure: UPPER ENDOSCOPIC ULTRASOUND (EUS) LINEAR;  Surgeon: Rachael Fee, MD;  Location: [facility];  Service: Endoscopy;  Laterality: N/A;   Family History  Problem Relation Age of Onset  . Asthma Mother   . Heart failure Mother   . Cancer Mother   . Diabetes Mother   . Hypertension Mother   . Stroke Mother   . Heart failure Father   . Diabetes Father   . Colon cancer Neg Hx   .  Pancreatic cancer Mother     deceased   History  Substance Use Topics  . Smoking status: Current Every Day Smoker -- 0.50 packs/day for 20 years    Types: Cigarettes  . Smokeless tobacco: Never Used  . Alcohol Use: No   OB History   Grav Para Term Preterm Abortions TAB SAB Ect Mult Living   4 3 3  1  1   3      Review of Systems ROS: Statement: All systems negative except as marked or noted in the HPI; Constitutional: Negative for fever and chills. ; ; Eyes: Negative for eye pain, redness and discharge. ; ; ENMT: Negative for ear pain, hoarseness, nasal congestion, sinus pressure and sore throat. ; ; Cardiovascular: Negative for chest pain, palpitations, diaphoresis, dyspnea and peripheral edema. ; ; Respiratory: Negative for cough, wheezing and stridor. ; ; Gastrointestinal: +abd pain, N/V. Negative for diarrhea, blood in stool, hematemesis, jaundice and rectal bleeding. . ; ; Genitourinary: Negative for dysuria, flank pain and hematuria. ; ; Musculoskeletal: Negative for back pain and neck pain. Negative for swelling and trauma.; ; Skin: Negative for pruritus, rash, abrasions, blisters, bruising and skin lesion.; ; Neuro: Negative for headache, lightheadedness and neck stiffness. Negative for weakness, altered level of consciousness , altered mental status, extremity weakness, paresthesias, involuntary movement, seizure  and syncope.      Allergies  Hydrocodone and Ibuprofen  Home Medications  No current outpatient prescriptions on file. BP 138/72  Pulse 85  Temp(Src) 97.9 F (36.6 C) (Oral)  Resp 18  Ht 5\' 7"  (1.702 m)  Wt 190 lb (86.183 kg)  BMI 29.75 kg/m2  SpO2 100%  LMP 02/09/2012 Physical Exam 1730: Physical examination:  Nursing notes reviewed; Vital signs and O2 SAT reviewed;  Constitutional: Well developed, Well nourished, Well hydrated, In no acute distress; Head:  Normocephalic, atraumatic; Eyes: EOMI, PERRL, No scleral icterus; ENMT: Mouth and pharynx normal, Mucous  membranes moist; Neck: Supple, Full range of motion, No lymphadenopathy; Cardiovascular: Regular rate and rhythm, No murmur, rub, or gallop; Respiratory: Breath sounds clear & equal bilaterally, No rales, rhonchi, wheezes.  Speaking full sentences with ease, Normal respiratory effort/excursion; Chest: Nontender, Movement normal; Abdomen: Soft, +mid-epigastric area tenderness to palp. No rebound or guarding. Nondistended, Normal bowel sounds; Genitourinary: No CVA tenderness; Extremities: Pulses normal, No tenderness, No edema, No calf edema or asymmetry.; Neuro: AA&Ox3, Major CN grossly intact.  Speech clear. No gross focal motor or sensory deficits in extremities.; Skin: Color normal, Warm, Dry.   ED Course   Procedures     MDM  MDM Reviewed: previous chart, nursing note and vitals Reviewed previous: labs Interpretation: labs, x-ray and CT scan   Results for orders placed during the hospital encounter of 07/23/13  PREGNANCY, URINE      Result Value Range   Preg Test, Ur NEGATIVE  NEGATIVE  URINALYSIS, ROUTINE W REFLEX MICROSCOPIC      Result Value Range   Color, Urine YELLOW  YELLOW   APPearance CLEAR  CLEAR   Specific Gravity, Urine <1.005 (*) 1.005 - 1.030   pH 6.0  5.0 - 8.0   Glucose, UA NEGATIVE  NEGATIVE mg/dL   Hgb urine dipstick TRACE (*) NEGATIVE   Bilirubin Urine NEGATIVE  NEGATIVE   Ketones, ur NEGATIVE  NEGATIVE mg/dL   Protein, ur NEGATIVE  NEGATIVE mg/dL   Urobilinogen, UA 0.2  0.0 - 1.0 mg/dL   Nitrite NEGATIVE  NEGATIVE   Leukocytes, UA NEGATIVE  NEGATIVE  CBC WITH DIFFERENTIAL      Result Value Range   WBC 11.0 (*) 4.0 - 10.5 K/uL   RBC 4.86  3.87 - 5.11 MIL/uL   Hemoglobin 14.3  12.0 - 15.0 g/dL   HCT 16.1  09.6 - 04.5 %   MCV 85.6  78.0 - 100.0 fL   MCH 29.4  26.0 - 34.0 pg   MCHC 34.4  30.0 - 36.0 g/dL   RDW 40.9  81.1 - 91.4 %   Platelets 431 (*) 150 - 400 K/uL   Neutrophils Relative % 55  43 - 77 %   Neutro Abs 6.1  1.7 - 7.7 K/uL   Lymphocytes  Relative 37  12 - 46 %   Lymphs Abs 4.1 (*) 0.7 - 4.0 K/uL   Monocytes Relative 6  3 - 12 %   Monocytes Absolute 0.6  0.1 - 1.0 K/uL   Eosinophils Relative 2  0 - 5 %   Eosinophils Absolute 0.2  0.0 - 0.7 K/uL   Basophils Relative 0  0 - 1 %   Basophils Absolute 0.0  0.0 - 0.1 K/uL  COMPREHENSIVE METABOLIC PANEL      Result Value Range   Sodium 131 (*) 135 - 145 mEq/L   Potassium 2.5 (*) 3.5 - 5.1 mEq/L   Chloride 89 (*) 96 -  112 mEq/L   CO2 30  19 - 32 mEq/L   Glucose, Bld 99  70 - 99 mg/dL   BUN 14  6 - 23 mg/dL   Creatinine, Ser 1.61  0.50 - 1.10 mg/dL   Calcium 9.1  8.4 - 09.6 mg/dL   Total Protein 7.9  6.0 - 8.3 g/dL   Albumin 3.9  3.5 - 5.2 g/dL   AST 15  0 - 37 U/L   ALT 12  0 - 35 U/L   Alkaline Phosphatase 66  39 - 117 U/L   Total Bilirubin 0.2 (*) 0.3 - 1.2 mg/dL   GFR calc non Af Amer 69 (*) >90 mL/min   GFR calc Af Amer 80 (*) >90 mL/min  LIPASE, BLOOD      Result Value Range   Lipase 75 (*) 11 - 59 U/L  URINE MICROSCOPIC-ADD ON      Result Value Range   Squamous Epithelial / LPF FEW (*) RARE   WBC, UA 0-2  <3 WBC/hpf   RBC / HPF 0-2  <3 RBC/hpf   Dg Chest 2 View 07/23/2013   *RADIOLOGY REPORT*  Clinical Data: Chest pain  CHEST - 2 VIEW  Comparison: 10/23/2012  Findings: The cardiac shadow is stable.  The lungs are clear bilaterally.  No focal bony abnormality is seen.  IMPRESSION: No acute abnormality noted.   Original Report Authenticated By: Alcide Clever, M.D.   Ct Abdomen Pelvis W Contrast 07/23/2013   *RADIOLOGY REPORT*  Clinical Data: Pain and history of pancreatitis, peptic ulcer disease and hiatal hernia. Also stated history of gastroesophageal reflux disease and Barrett's esophagus.  CT ABDOMEN AND PELVIS WITH CONTRAST  Technique:  Multidetector CT imaging of the abdomen and pelvis was performed following the standard protocol during bolus administration of intravenous contrast.  Contrast: 50mL OMNIPAQUE IOHEXOL 300 MG/ML  SOLN, OMNIPAQUE IOHEXOL 300  MG/ML  SOLN  Comparison: 03/25/2013  Findings: Similar circumferential thickening of the distal esophagus which may relate to chronic reflux/ known Barrett's esophagus.  Correlation suggested with prior endoscopies.  The liver shows stable steatosis without evidence of mass.  The gallbladder has been removed.  There is no evidence of biliary ductal dilatation.  The pancreas shows normal enhancement and no evidence of surrounding inflammation, abnormal calcification, mass or abnormal fluid collection.  The spleen, adrenal glands and kidneys are stable and normal in appearance.  Bowel loops show no evidence of obstruction.  No inflammatory process or abnormal fluid collection is seen.  The bladder, uterus and adnexal regions are unremarkable.  No hernias are identified.  IMPRESSION: No acute findings.  Similar thickened appearance of the distal esophagus.  This could relate to chronic reflux and known history of Barrett's esophagus.  Correlation suggested with prior endoscopic exams.  Stable hepatic steatosis.  No evidence of pancreatitis by CT.   Original Report Authenticated By: Irish Lack, M.D.    Results for KHRISTA, BRAUN (MRN [id number]) as of 07/23/2013 20:43  Ref. Range 10/23/2012 15:30 03/25/2013 14:26 03/26/2013 05:20 07/23/2013 18:09  Lipase Latest Range: 11-59 U/L 61 (H) 590 (H) 128 (H) 75 (H)    2030:  Potassium repleted PO and IV. Pt has tol PO well while in the ED without N/V.  No stooling while in the ED.  Abd benign, VSS. Wants to go home now. Will tx symptomatically at this time, f/u with GI MD.  Dx and testing d/w pt.  Questions answered.  Verb understanding, agreeable to d/c home with outpt  f/u.    Laray Anger, DO 07/24/13 [phone]

## 2013-09-04 ENCOUNTER — Emergency Department (HOSPITAL_COMMUNITY)
Admission: EM | Admit: 2013-09-04 | Discharge: 2013-09-04 | Discharge status: Against Medical Advice | Payer: Self-pay | Attending: Emergency Medicine | Admitting: Emergency Medicine

## 2013-09-04 ENCOUNTER — Encounter (HOSPITAL_COMMUNITY): Payer: Self-pay

## 2013-09-04 ENCOUNTER — Emergency Department (HOSPITAL_COMMUNITY): Payer: Self-pay

## 2013-09-04 DIAGNOSIS — R1110 Vomiting, unspecified: Secondary | ICD-10-CM | POA: Insufficient documentation

## 2013-09-04 DIAGNOSIS — F17200 Nicotine dependence, unspecified, uncomplicated: Secondary | ICD-10-CM | POA: Insufficient documentation

## 2013-09-04 DIAGNOSIS — R1084 Generalized abdominal pain: Secondary | ICD-10-CM | POA: Insufficient documentation

## 2013-09-04 DIAGNOSIS — Z9089 Acquired absence of other organs: Secondary | ICD-10-CM | POA: Insufficient documentation

## 2013-09-04 DIAGNOSIS — Z8711 Personal history of peptic ulcer disease: Secondary | ICD-10-CM | POA: Insufficient documentation

## 2013-09-04 DIAGNOSIS — G8929 Other chronic pain: Secondary | ICD-10-CM

## 2013-09-04 DIAGNOSIS — Z9851 Tubal ligation status: Secondary | ICD-10-CM | POA: Insufficient documentation

## 2013-09-04 DIAGNOSIS — Z8719 Personal history of other diseases of the digestive system: Secondary | ICD-10-CM | POA: Insufficient documentation

## 2013-09-04 LAB — CBC WITH DIFFERENTIAL/PLATELET
Basophils Absolute: 0 10*3/uL (ref 0.0–0.1)
Basophils Relative: 0 % (ref 0–1)
Eosinophils Relative: 2 % (ref 0–5)
HCT: 39.6 % (ref 36.0–46.0)
Hemoglobin: 13.7 g/dL (ref 12.0–15.0)
MCHC: 34.6 g/dL (ref 30.0–36.0)
MCV: 87.4 fL (ref 78.0–100.0)
Monocytes Absolute: 0.5 10*3/uL (ref 0.1–1.0)
Monocytes Relative: 4 % (ref 3–12)
Neutro Abs: 6.9 10*3/uL (ref 1.7–7.7)
RDW: 13.9 % (ref 11.5–15.5)

## 2013-09-04 LAB — BASIC METABOLIC PANEL
BUN: 11 mg/dL (ref 6–23)
CO2: 30 mEq/L (ref 19–32)
Calcium: 10 mg/dL (ref 8.4–10.5)
Chloride: 104 mEq/L (ref 96–112)
Creatinine, Ser: 0.78 mg/dL (ref 0.50–1.10)

## 2013-09-04 LAB — HEPATIC FUNCTION PANEL
ALT: 15 U/L (ref 0–35)
Alkaline Phosphatase: 64 U/L (ref 39–117)
Bilirubin, Direct: <0.1 mg/dL (ref 0.0–0.3)

## 2013-09-04 LAB — LIPASE, BLOOD: Lipase: 75 U/L — ABNORMAL HIGH (ref 11–59)

## 2013-09-04 NOTE — ED Notes (Signed)
No one in room for recheck. edp also states just went in and on one there.

## 2013-09-04 NOTE — ED Provider Notes (Addendum)
CSN: [id number]     Arrival date & time 09/04/13  1115 History  This chart was scribed for Gilda Crease, MD by Greggory Stallion, ED Scribe. This patient was seen in room [facility]09/[facility]09 and the patient's care was started at 2:56 PM.   Chief Complaint  Patient presents with  . Abdominal Pain   The history is provided by the patient. No language interpreter was used.    HPI Comments: Rita [name] is a 47 y.o. female with h/o chronic back and abdominal pain who presents to the Emergency Department complaining of lower abdominal pain that radiates to her lower back that started yesterday. Pt states she is also having hematemesis and it's hard for her to eat. She states she has had this before. Pt denies any other associated symptoms.   Past Medical History  Diagnosis Date  . Pancreatitis   . Peptic ulcer   . GERD (gastroesophageal reflux disease)   . Hiatal hernia   . Chronic back pain   . Chronic abdominal pain   . Barrett's esophagus with esophagitis 03/26/2013  . Tobacco use 03/26/2013   Past Surgical History  Procedure Laterality Date  . Cholecystectomy    . Cesarean section    . Tubal ligation    . Esophagogastroduodenoscopy  Oct 2011    Dr. Karilyn Cota: ulcer in distal esophagus, soft stricture at GE junction s/p balloon dilation PATH: BARRETT'S  . Esophagogastroduodenoscopy (egd) with esophageal dilation N/A 03/27/2013    Procedure: ESOPHAGOGASTRODUODENOSCOPY (EGD) WITH ESOPHAGEAL DILATION;  Surgeon: Corbin Ade, MD;  Location: [facility];  Service: Endoscopy;  Laterality: N/A;  possible dilation  . Eus N/A 05/09/2013    Procedure: UPPER ENDOSCOPIC ULTRASOUND (EUS) LINEAR;  Surgeon: Rachael Fee, MD;  Location: [facility];  Service: Endoscopy;  Laterality: N/A;   Family History  Problem Relation Age of Onset  . Asthma Mother   . Heart failure Mother   . Cancer Mother   . Diabetes Mother   . Hypertension Mother   . Stroke Mother   . Heart failure Father   . Diabetes  Father   . Colon cancer Neg Hx   . Pancreatic cancer Mother     deceased   History  Substance Use Topics  . Smoking status: Current Every Day Smoker -- 0.50 packs/day for 20 years    Types: Cigarettes  . Smokeless tobacco: Never Used  . Alcohol Use: No   OB History   Grav Para Term Preterm Abortions TAB SAB Ect Mult Living   4 3 3  1  1   3      Review of Systems  Gastrointestinal: Positive for vomiting and abdominal pain.  All other systems reviewed and are negative.    Allergies  Hydrocodone and Ibuprofen  Home Medications   Current Outpatient Rx  Name  Route  Sig  Dispense  Refill  . acetaminophen (TYLENOL) 500 MG tablet   Oral   Take 1,000 mg by mouth every 6 (six) hours as needed for pain.          BP 153/79  Pulse 77  Temp(Src) 98.3 F (36.8 C) (Oral)  Resp 20  Ht 5\' 7"  (1.702 m)  Wt 190 lb (86.183 kg)  BMI 29.75 kg/m2  SpO2 99%  LMP 02/09/2012  Physical Exam  Nursing note and vitals reviewed. Constitutional: She is oriented to person, place, and time. She appears well-developed and well-nourished. No distress.  HENT:  Head: Normocephalic and atraumatic.  Right Ear:  Hearing normal.  Left Ear: Hearing normal.  Nose: Nose normal.  Mouth/Throat: Oropharynx is clear and moist and mucous membranes are normal.  Eyes: Conjunctivae and EOM are normal. Pupils are equal, round, and reactive to light.  Neck: Normal range of motion. Neck supple.  Cardiovascular: Regular rhythm, S1 normal and S2 normal.  Exam reveals no gallop and no friction rub.   No murmur heard. Pulmonary/Chest: Effort normal and breath sounds normal. No respiratory distress. She exhibits no tenderness.  Abdominal: Soft. Normal appearance and bowel sounds are normal. There is no hepatosplenomegaly. There is no rebound, no guarding, no tenderness at McBurney's point and negative Murphy's sign. No hernia.  Diffuse abdominal pain.  Musculoskeletal: Normal range of motion.  Neurological: She  is alert and oriented to person, place, and time. She has normal strength. No cranial nerve deficit or sensory deficit. Coordination normal. GCS eye subscore is 4. GCS verbal subscore is 5. GCS motor subscore is 6.  Skin: Skin is warm, dry and intact. No rash noted. No cyanosis.  Psychiatric: She has a normal mood and affect. Her speech is normal and behavior is normal. Thought content normal.    ED Course  Procedures (including critical care time)  DIAGNOSTIC STUDIES: Oxygen Saturation is 99% on RA, normal by my interpretation.    COORDINATION OF CARE: 3:12 PM-Discussed treatment plan which includes labs with pt at bedside and pt agreed to plan.   Labs Review Labs Reviewed  CBC WITH DIFFERENTIAL - Abnormal; Notable for the following:    WBC 10.9 (*)    Platelets 471 (*)    All other components within normal limits  HEPATIC FUNCTION PANEL - Abnormal; Notable for the following:    Total Bilirubin 0.2 (*)    All other components within normal limits  LIPASE, BLOOD - Abnormal; Notable for the following:    Lipase 75 (*)    All other components within normal limits  BASIC METABOLIC PANEL   Imaging Review Dg Abd Acute W/chest  09/04/2013   *RADIOLOGY REPORT*  Clinical Data: Abdominal pain  ACUTE ABDOMEN SERIES (ABDOMEN 2 VIEW & CHEST 1 VIEW)  Comparison: Chest radiograph July 23, 2013; CT abdomen and pelvis July 23, 2013  Findings:  PA chest:  There is no edema or consolidation.  Heart size and pulmonary vascularity are normal. No adenopathy.  Supine and upright abdomen: There is moderate stool in the colon. Bowel gas pattern is unremarkable.  No obstruction or free air. There are no abnormal calcifications.  There are surgical clips in the gallbladder fossa region.  Liver appears enlarged.  IMPRESSION:  Liver appears enlarged.  Bowel gas pattern unremarkable.  No edema or consolidation.   Original Report Authenticated By: Bretta Bang, M.D.    MDM  Diagnosis: Chronic abdominal  pain  Patient presents to the ER for evaluation of abdominal pain. Patient has chronic abdominal pain from previous pancreatitis. This is examination reveals diffuse pain. There is nothing to support a peritonitis or acute surgical process. X-ray does not show any evidence of obstruction. Lab work was unremarkable. There is nothing to indicate any acute process in the patient. Patient left the emergency department prior to the return of her labs. Review of the workup, however, reveals nothing unusual and no need for any further treatment or evaluation.  I personally performed the services described in this documentation, which was scribed in my presence. The recorded information has been reviewed and is accurate.    Gilda Crease, MD 09/05/13 [phone]  Gilda Crease, MD 09/05/13 1530

## 2013-09-04 NOTE — ED Notes (Signed)
Complain of abdominal and back pain. States she has been vomiting blood. Has hx

## 2013-10-21 ENCOUNTER — Encounter (HOSPITAL_COMMUNITY): Payer: Self-pay | Admitting: Emergency Medicine

## 2013-10-21 ENCOUNTER — Emergency Department (HOSPITAL_COMMUNITY)
Admission: EM | Admit: 2013-10-21 | Discharge: 2013-10-21 | Disposition: A | Discharge status: Home / Self Care | Payer: Self-pay | Attending: Emergency Medicine | Admitting: Emergency Medicine

## 2013-10-21 DIAGNOSIS — R05 Cough: Secondary | ICD-10-CM | POA: Insufficient documentation

## 2013-10-21 DIAGNOSIS — K029 Dental caries, unspecified: Secondary | ICD-10-CM

## 2013-10-21 DIAGNOSIS — F17200 Nicotine dependence, unspecified, uncomplicated: Secondary | ICD-10-CM | POA: Insufficient documentation

## 2013-10-21 DIAGNOSIS — R062 Wheezing: Secondary | ICD-10-CM | POA: Insufficient documentation

## 2013-10-21 DIAGNOSIS — R059 Cough, unspecified: Secondary | ICD-10-CM | POA: Insufficient documentation

## 2013-10-21 DIAGNOSIS — Z8711 Personal history of peptic ulcer disease: Secondary | ICD-10-CM | POA: Insufficient documentation

## 2013-10-21 DIAGNOSIS — K089 Disorder of teeth and supporting structures, unspecified: Secondary | ICD-10-CM | POA: Insufficient documentation

## 2013-10-21 DIAGNOSIS — G8929 Other chronic pain: Secondary | ICD-10-CM | POA: Insufficient documentation

## 2013-10-21 MED ORDER — AMOXICILLIN 250 MG PO CAPS
500.0000 mg | ORAL_CAPSULE | Freq: Once | ORAL | Status: AC
  Administered 2013-10-21: 500 mg via ORAL
  Filled 2013-10-21: qty 2

## 2013-10-21 MED ORDER — TRAMADOL HCL 50 MG PO TABS: 50.0000 mg | ORAL_TABLET | Freq: Four times a day (QID) | ORAL | Status: DC | PRN

## 2013-10-21 MED ORDER — OXYCODONE-ACETAMINOPHEN 5-325 MG PO TABS
1.0000 | ORAL_TABLET | Freq: Once | ORAL | Status: AC
  Administered 2013-10-21: 1 via ORAL
  Filled 2013-10-21: qty 1

## 2013-10-21 MED ORDER — AMOXICILLIN 500 MG PO CAPS: 500.0000 mg | ORAL_CAPSULE | Freq: Three times a day (TID) | ORAL | Status: DC

## 2013-10-21 NOTE — ED Notes (Addendum)
"  sore in my mouth" rt lower side. Cough "at night when I lay down" hoarse

## 2013-10-21 NOTE — ED Provider Notes (Signed)
CSN: [id number]     Arrival date & time 10/21/13  1603 History   First MD Initiated Contact with Patient 10/21/13 1627     Chief Complaint  Patient presents with  . Dental Pain   (Consider location/radiation/quality/duration/timing/severity/associated sxs/prior Treatment) Patient is a 47 y.o. female presenting with tooth pain. The history is provided by the patient.  Dental Pain Location:  Lower Lower teeth location:  31/RL 2nd molar, 30/RL 1st molar and 29/RL 2nd bicuspid Quality:  Throbbing and constant Severity:  Severe Onset quality:  Gradual Timing:  Constant Progression:  Worsening Chronicity:  Chronic Context: dental caries and poor dentition   Worsened by:  Cold food/drink and pressure Ineffective treatments:  Acetaminophen Associated symptoms: no facial swelling, no fever and no headaches   Risk factors: smoking    Rita [name] is a 48 y.o. female who presents to the ED with dental pain. The pain has been ongoing for several months. She has teeth on the lower right that have decayed down into the gum. The gum is swollen. She has not seen a dentist because they want money up front. She also has a cough that has been going on for months but she has scheduled an appointment for work up for that.   Past Medical History  Diagnosis Date  . Pancreatitis   . Peptic ulcer   . GERD (gastroesophageal reflux disease)   . Hiatal hernia   . Chronic back pain   . Chronic abdominal pain   . Barrett's esophagus with esophagitis 03/26/2013  . Tobacco use 03/26/2013   Past Surgical History  Procedure Laterality Date  . Cholecystectomy    . Cesarean section    . Tubal ligation    . Esophagogastroduodenoscopy  Oct 2011    Dr. Karilyn Cota: ulcer in distal esophagus, soft stricture at GE junction s/p balloon dilation PATH: BARRETT'S  . Esophagogastroduodenoscopy (egd) with esophageal dilation N/A 03/27/2013    Procedure: ESOPHAGOGASTRODUODENOSCOPY (EGD) WITH ESOPHAGEAL DILATION;  Surgeon:  Corbin Ade, MD;  Location: [facility];  Service: Endoscopy;  Laterality: N/A;  possible dilation  . Eus N/A 05/09/2013    Procedure: UPPER ENDOSCOPIC ULTRASOUND (EUS) LINEAR;  Surgeon: Rachael Fee, MD;  Location: [facility];  Service: Endoscopy;  Laterality: N/A;   Family History  Problem Relation Age of Onset  . Asthma Mother   . Heart failure Mother   . Cancer Mother   . Diabetes Mother   . Hypertension Mother   . Stroke Mother   . Heart failure Father   . Diabetes Father   . Colon cancer Neg Hx   . Pancreatic cancer Mother     deceased   History  Substance Use Topics  . Smoking status: Current Every Day Smoker -- 0.50 packs/day for 20 years    Types: Cigarettes  . Smokeless tobacco: Never Used  . Alcohol Use: No   OB History   Grav Para Term Preterm Abortions TAB SAB Ect Mult Living   4 3 3  1  1   3      Review of Systems  Constitutional: Negative for fever.  HENT: Positive for dental problem. Negative for facial swelling.   Eyes: Negative for redness.  Respiratory: Positive for cough and wheezing.   Gastrointestinal: Negative for nausea and vomiting.  Musculoskeletal: Negative for myalgias.  Skin: Negative for rash.  Neurological: Negative for headaches.  Psychiatric/Behavioral: The patient is not nervous/anxious.     Allergies  Hydrocodone and Ibuprofen  Home Medications  Current Outpatient Rx  Name  Route  Sig  Dispense  Refill  . acetaminophen (TYLENOL) 500 MG tablet   Oral   Take 1,000 mg by mouth every 6 (six) hours as needed for pain.         Marland Kitchen amoxicillin (AMOXIL) 500 MG capsule   Oral   Take 1 capsule (500 mg total) by mouth 3 (three) times daily.   21 capsule   0   . traMADol (ULTRAM) 50 MG tablet   Oral   Take 1 tablet (50 mg total) by mouth every 6 (six) hours as needed for pain.   15 tablet   0    LMP 02/09/2012 Physical Exam  Nursing note and vitals reviewed. Constitutional: She is oriented to person, place, and  time. She appears well-developed and well-nourished. No distress.  HENT:  Head: Normocephalic and atraumatic.  Mouth/Throat: Uvula is midline, oropharynx is clear and moist and mucous membranes are normal.    Teeth decayed to the gum with swelling noted. Tender on exam.  Eyes: Conjunctivae and EOM are normal.  Neck: Neck supple.  Cardiovascular: Normal rate and regular rhythm.   Pulmonary/Chest: Effort normal. She has wheezes.  Abdominal: Soft. There is no tenderness.  Musculoskeletal: Normal range of motion.  Neurological: She is alert and oriented to person, place, and time. No cranial nerve deficit.  Skin: Skin is warm and dry.  Psychiatric: She has a normal mood and affect. Her behavior is normal.    ED Course  Procedures  MDM   1. Pain due to dental caries    47 y.o. female with chronic dental pain. She is to follow up with the dental clinic as soon as possible.  Patient stable for discharge home without any immediate complications.  Discussed with the patient and all questioned fully answered.    Medication List    TAKE these medications       amoxicillin 500 MG capsule  Commonly known as:  AMOXIL  Take 1 capsule (500 mg total) by mouth 3 (three) times daily.     traMADol 50 MG tablet  Commonly known as:  ULTRAM  Take 1 tablet (50 mg total) by mouth every 6 (six) hours as needed for pain.      ASK your doctor about these medications       acetaminophen 500 MG tablet  Commonly known as:  TYLENOL  Take 1,000 mg by mouth every 6 (six) hours as needed for pain.         [name], [place] 10/21/13 [phone]

## 2013-10-22 NOTE — ED Provider Notes (Signed)
Medical screening examination/treatment/procedure(s) were performed by non-physician practitioner and as supervising physician I was immediately available for consultation/collaboration.  EKG Interpretation   None         Enid Skeens, MD 10/22/13 Jacinta Shoe

## 2014-01-20 ENCOUNTER — Encounter (HOSPITAL_COMMUNITY): Payer: Self-pay | Admitting: Emergency Medicine

## 2014-01-20 ENCOUNTER — Emergency Department (HOSPITAL_COMMUNITY)
Admission: EM | Admit: 2014-01-20 | Discharge: 2014-01-20 | Disposition: A | Discharge status: Home / Self Care | Payer: Self-pay | Attending: Emergency Medicine | Admitting: Emergency Medicine

## 2014-01-20 DIAGNOSIS — R1110 Vomiting, unspecified: Secondary | ICD-10-CM | POA: Insufficient documentation

## 2014-01-20 DIAGNOSIS — R0789 Other chest pain: Secondary | ICD-10-CM | POA: Insufficient documentation

## 2014-01-20 DIAGNOSIS — Z9851 Tubal ligation status: Secondary | ICD-10-CM | POA: Insufficient documentation

## 2014-01-20 DIAGNOSIS — Z9089 Acquired absence of other organs: Secondary | ICD-10-CM | POA: Insufficient documentation

## 2014-01-20 DIAGNOSIS — R0602 Shortness of breath: Secondary | ICD-10-CM | POA: Insufficient documentation

## 2014-01-20 DIAGNOSIS — F17200 Nicotine dependence, unspecified, uncomplicated: Secondary | ICD-10-CM | POA: Insufficient documentation

## 2014-01-20 DIAGNOSIS — R109 Unspecified abdominal pain: Secondary | ICD-10-CM

## 2014-01-20 DIAGNOSIS — M545 Low back pain, unspecified: Secondary | ICD-10-CM | POA: Insufficient documentation

## 2014-01-20 DIAGNOSIS — R1310 Dysphagia, unspecified: Secondary | ICD-10-CM | POA: Insufficient documentation

## 2014-01-20 DIAGNOSIS — Z8711 Personal history of peptic ulcer disease: Secondary | ICD-10-CM | POA: Insufficient documentation

## 2014-01-20 DIAGNOSIS — R1033 Periumbilical pain: Secondary | ICD-10-CM | POA: Insufficient documentation

## 2014-01-20 DIAGNOSIS — G8929 Other chronic pain: Secondary | ICD-10-CM | POA: Insufficient documentation

## 2014-01-20 DIAGNOSIS — R339 Retention of urine, unspecified: Secondary | ICD-10-CM | POA: Insufficient documentation

## 2014-01-20 DIAGNOSIS — Z8719 Personal history of other diseases of the digestive system: Secondary | ICD-10-CM | POA: Insufficient documentation

## 2014-01-20 LAB — URINALYSIS, ROUTINE W REFLEX MICROSCOPIC
Bilirubin Urine: NEGATIVE
Glucose, UA: NEGATIVE mg/dL
Hgb urine dipstick: NEGATIVE
Ketones, ur: NEGATIVE mg/dL
Leukocytes, UA: NEGATIVE
Nitrite: NEGATIVE
Protein, ur: NEGATIVE mg/dL
Specific Gravity, Urine: 1.015 (ref 1.005–1.030)
Urobilinogen, UA: 0.2 mg/dL (ref 0.0–1.0)
pH: 6.5 (ref 5.0–8.0)

## 2014-01-20 LAB — CBC WITH DIFFERENTIAL/PLATELET
Basophils Absolute: 0 10*3/uL (ref 0.0–0.1)
Basophils Relative: 0 % (ref 0–1)
Eosinophils Absolute: 0.2 10*3/uL (ref 0.0–0.7)
Eosinophils Relative: 3 % (ref 0–5)
HCT: 41.6 % (ref 36.0–46.0)
Hemoglobin: 13.9 g/dL (ref 12.0–15.0)
Lymphocytes Relative: 33 % (ref 12–46)
Lymphs Abs: 2.6 10*3/uL (ref 0.7–4.0)
MCH: 28.8 pg (ref 26.0–34.0)
MCHC: 33.4 g/dL (ref 30.0–36.0)
MCV: 86.1 fL (ref 78.0–100.0)
Monocytes Absolute: 0.5 10*3/uL (ref 0.1–1.0)
Monocytes Relative: 6 % (ref 3–12)
Neutro Abs: 4.6 10*3/uL (ref 1.7–7.7)
Neutrophils Relative %: 59 % (ref 43–77)
Platelets: 402 10*3/uL — ABNORMAL HIGH (ref 150–400)
RBC: 4.83 MIL/uL (ref 3.87–5.11)
RDW: 13.7 % (ref 11.5–15.5)
WBC: 7.8 10*3/uL (ref 4.0–10.5)

## 2014-01-20 LAB — COMPREHENSIVE METABOLIC PANEL
ALT: 21 U/L (ref 0–35)
AST: 29 U/L (ref 0–37)
Albumin: 3.5 g/dL (ref 3.5–5.2)
Alkaline Phosphatase: 68 U/L (ref 39–117)
BUN: 9 mg/dL (ref 6–23)
CO2: 24 mEq/L (ref 19–32)
Calcium: 9.1 mg/dL (ref 8.4–10.5)
Chloride: 104 mEq/L (ref 96–112)
Creatinine, Ser: 0.79 mg/dL (ref 0.50–1.10)
GFR calc Af Amer: >90 mL/min (ref >90)
GFR calc non Af Amer: >90 mL/min (ref >90)
Glucose, Bld: 95 mg/dL (ref 70–99)
Potassium: 3.9 mEq/L (ref 3.7–5.3)
Sodium: 143 mEq/L (ref 137–147)
Total Bilirubin: 0.2 mg/dL — ABNORMAL LOW (ref 0.3–1.2)
Total Protein: 7.5 g/dL (ref 6.0–8.3)

## 2014-01-20 LAB — LIPASE, BLOOD: Lipase: 75 U/L — ABNORMAL HIGH (ref 11–59)

## 2014-01-20 MED ORDER — GI COCKTAIL ~~LOC~~
30.0000 mL | Freq: Once | ORAL | Status: AC
  Administered 2014-01-20: 30 mL via ORAL
  Filled 2014-01-20: qty 30

## 2014-01-20 MED ORDER — FAMOTIDINE IN NACL 20-0.9 MG/50ML-% IV SOLN
20.0000 mg | Freq: Once | INTRAVENOUS | Status: AC
  Administered 2014-01-20: 20 mg via INTRAVENOUS
  Filled 2014-01-20: qty 50

## 2014-01-20 MED ORDER — ONDANSETRON HCL 4 MG/2ML IJ SOLN
4.0000 mg | Freq: Once | INTRAMUSCULAR | Status: DC
  Filled 2014-01-20: qty 2

## 2014-01-20 MED ORDER — HYDROMORPHONE HCL PF 1 MG/ML IJ SOLN
1.0000 mg | Freq: Once | INTRAMUSCULAR | Status: AC
  Administered 2014-01-20: 1 mg via INTRAVENOUS
  Filled 2014-01-20: qty 1

## 2014-01-20 MED ORDER — ONDANSETRON HCL 4 MG/2ML IJ SOLN
4.0000 mg | Freq: Once | INTRAMUSCULAR | Status: AC
  Administered 2014-01-20: 4 mg via INTRAVENOUS

## 2014-01-20 MED ORDER — SODIUM CHLORIDE 0.9 % IV BOLUS (SEPSIS)
1000.0000 mL | Freq: Once | INTRAVENOUS | Status: AC
  Administered 2014-01-20: 1000 mL via INTRAVENOUS

## 2014-01-20 NOTE — Discharge Instructions (Signed)
Abdominal Pain, Adult  Many things can cause belly (abdominal) pain. Most times, the belly pain is not dangerous. Many cases of belly pain can be watched and treated at home.  HOME CARE   · Do not take medicines that help you go poop (laxatives) unless told to by your doctor.  · Only take medicine as told by your doctor.  · Eat or drink as told by your doctor. Your doctor will tell you if you should be on a special diet.  GET HELP IF:  · You do not know what is causing your belly pain.  · You have belly pain while you are sick to your stomach (nauseous) or have runny poop (diarrhea).  · You have pain while you pee or poop.  · Your belly pain wakes you up at night.  · You have belly pain that gets worse or better when you eat.  · You have belly pain that gets worse when you eat fatty foods.  GET HELP RIGHT AWAY IF:   · The pain does not go away within 2 hours.  · You have a fever.  · You keep throwing up (vomiting).  · The pain changes and is only in the right or left part of the belly.  · You have bloody or tarry looking poop.  MAKE SURE YOU:   · Understand these instructions.  · Will watch your condition.  · Will get help right away if you are not doing well or get worse.  Document Released: 05/30/2008 Document Revised: 10/02/2013 Document Reviewed: 08/21/2013  [employer]® Patient Information ©2014 [employer].

## 2014-01-20 NOTE — ED Notes (Signed)
C/o abd pain/back pain since yesterday. Also states she can't swallow. Pt handling secretions in triage without difficulty. nad noted. Pt tearful

## 2014-01-20 NOTE — ED Provider Notes (Signed)
CSN: [id number]     Arrival date & time 01/20/14  1153 History  This chart was scribed for Virgel Manifold, MD by Zettie Pho, ED Scribe. This patient was seen in room APA19/[facility]19 and the patient's care was started at 1:37 PM.    Chief Complaint  Patient presents with  . Abdominal Pain  . Back Pain   The history is provided by the patient. No language interpreter was used.   HPI Comments: Rita [name] is a 48 y.o. Female with a history of pancreatitis, peptic ulcer, GERD, hiatal hernia, and Barett's esophagus with esophagitis, chronic abdominal pain, and chronic back pain who presents to the Emergency Department complaining of an intermittent, "gouging" pain to the periumbilical region of the abdomen onset this morning. She states that each episode lasts a few seconds and will onset about every 3-4 minutes. She states that this type of pain is new for her. Patient reports some associated difficulty swallowing, multiple episodes of emesis, chills, and some urinary retention. She reports some associated chest tightness and "feeling like I can't get enough air." She denies taking any medications at home to manage her symptoms. Patient has been seen here numerous times in the past for similar complaints. She denies diarrhea, dysuria, fever. Patient also has a history of cholecystectomy, cesarean section, tubal ligation, and esophagogastroduodenoscopy.   Patient is also complaining of a constant, sharp pain to the lower back that is chronic in nature. She states that her back pain is usually exacerbated with her abdominal pain. Patient reports an allergy to hydrocodone.   Past Medical History  Diagnosis Date  . Pancreatitis   . Peptic ulcer   . GERD (gastroesophageal reflux disease)   . Hiatal hernia   . Chronic back pain   . Chronic abdominal pain   . Barrett's esophagus with esophagitis 03/26/2013  . Tobacco use 03/26/2013   Past Surgical History  Procedure Laterality Date  . Cholecystectomy    .  Cesarean section    . Tubal ligation    . Esophagogastroduodenoscopy  Oct 2011    Dr. Laural Golden: ulcer in distal esophagus, soft stricture at GE junction s/p balloon dilation PATH: BARRETT'S  . Esophagogastroduodenoscopy (egd) with esophageal dilation N/A 03/27/2013    Procedure: ESOPHAGOGASTRODUODENOSCOPY (EGD) WITH ESOPHAGEAL DILATION;  Surgeon: Daneil Dolin, MD;  Location: [facility];  Service: Endoscopy;  Laterality: N/A;  possible dilation  . Eus N/A 05/09/2013    Procedure: UPPER ENDOSCOPIC ULTRASOUND (EUS) LINEAR;  Surgeon: Milus Banister, MD;  Location: [facility];  Service: Endoscopy;  Laterality: N/A;   Family History  Problem Relation Age of Onset  . Asthma Mother   . Heart failure Mother   . Cancer Mother   . Diabetes Mother   . Hypertension Mother   . Stroke Mother   . Heart failure Father   . Diabetes Father   . Colon cancer Neg Hx   . Pancreatic cancer Mother     deceased   History  Substance Use Topics  . Smoking status: Current Every Day Smoker -- 0.50 packs/day for 20 years    Types: Cigarettes  . Smokeless tobacco: Never Used  . Alcohol Use: No   OB History   Grav Para Term Preterm Abortions TAB SAB Ect Mult Living   4 3 3  1  1   3      Review of Systems  Constitutional: Negative for fever.  HENT: Positive for trouble swallowing.   Respiratory: Positive for chest tightness  and shortness of breath.   Gastrointestinal: Positive for vomiting and abdominal pain. Negative for diarrhea.  Genitourinary: Positive for decreased urine volume. Negative for dysuria.  Musculoskeletal: Positive for back pain.  All other systems reviewed and are negative.    Allergies  Hydrocodone and Ibuprofen  Home Medications   Current Outpatient Rx  Name  Route  Sig  Dispense  Refill  . acetaminophen (TYLENOL) 500 MG tablet   Oral   Take 1,000 mg by mouth every 6 (six) hours as needed for pain.          Triage Vitals: BP 144/121  Pulse 87  Temp(Src) 98.1 F  (36.7 C) (Oral)  Resp 18  SpO2 98%  LMP 02/09/2012  Physical Exam  Nursing note and vitals reviewed. Constitutional: She is oriented to person, place, and time. She appears well-developed and well-nourished. No distress.  HENT:  Head: Normocephalic and atraumatic.  Eyes: Conjunctivae are normal.  Neck: Normal range of motion. Neck supple.  Cardiovascular: Normal rate, regular rhythm and normal heart sounds.   Pulmonary/Chest: Effort normal and breath sounds normal. No respiratory distress.  Abdominal: Soft. She exhibits no distension. There is tenderness. There is no rebound and no guarding.  Mild suprapubic tenderness. No CVA tenderness.   Musculoskeletal: Normal range of motion.  Neurological: She is alert and oriented to person, place, and time.  Skin: Skin is warm and dry.  Psychiatric: She has a normal mood and affect. Her behavior is normal.    ED Course  Procedures (including critical care time)  DIAGNOSTIC STUDIES: Oxygen Saturation is 98% on room air, normal by my interpretation.    COORDINATION OF CARE: 1:43 PM- Will order blood labs and UA. Will order IV fluids, Pepcid, Dilaudid, Zofran, and a GI cocktail to manage symptoms. Discussed treatment plan with patient at bedside and patient verbalized agreement.     Labs Review Labs Reviewed  CBC WITH DIFFERENTIAL - Abnormal; Notable for the following:    Platelets 402 (*)    All other components within normal limits  COMPREHENSIVE METABOLIC PANEL  URINALYSIS, DIPSTICK ONLY  LIPASE, BLOOD   Imaging Review No results found.  EKG Interpretation   None       MDM   1. Abdominal pain     I personally preformed the services scribed in my presence. The recorded information has been reviewed is accurate. Virgel Manifold, MD.     Virgel Manifold, MD 01/23/14 [phone]

## 2014-01-20 NOTE — ED Notes (Signed)
Pt states she vomited one time today.complain of esophageus "acting up " states she can't swallow,

## 2014-03-07 ENCOUNTER — Emergency Department (HOSPITAL_COMMUNITY)
Admission: EM | Admit: 2014-03-07 | Discharge: 2014-03-07 | Disposition: A | Discharge status: Home / Self Care | Payer: Self-pay | Attending: Emergency Medicine | Admitting: Emergency Medicine

## 2014-03-07 ENCOUNTER — Emergency Department (HOSPITAL_COMMUNITY): Payer: Self-pay

## 2014-03-07 ENCOUNTER — Encounter (HOSPITAL_COMMUNITY): Payer: Self-pay | Admitting: Emergency Medicine

## 2014-03-07 DIAGNOSIS — Z8711 Personal history of peptic ulcer disease: Secondary | ICD-10-CM | POA: Insufficient documentation

## 2014-03-07 DIAGNOSIS — G8929 Other chronic pain: Secondary | ICD-10-CM | POA: Insufficient documentation

## 2014-03-07 DIAGNOSIS — R109 Unspecified abdominal pain: Secondary | ICD-10-CM | POA: Insufficient documentation

## 2014-03-07 DIAGNOSIS — Z9889 Other specified postprocedural states: Secondary | ICD-10-CM | POA: Insufficient documentation

## 2014-03-07 DIAGNOSIS — F17200 Nicotine dependence, unspecified, uncomplicated: Secondary | ICD-10-CM | POA: Insufficient documentation

## 2014-03-07 DIAGNOSIS — Z9089 Acquired absence of other organs: Secondary | ICD-10-CM | POA: Insufficient documentation

## 2014-03-07 DIAGNOSIS — Z7982 Long term (current) use of aspirin: Secondary | ICD-10-CM | POA: Insufficient documentation

## 2014-03-07 DIAGNOSIS — K219 Gastro-esophageal reflux disease without esophagitis: Secondary | ICD-10-CM | POA: Insufficient documentation

## 2014-03-07 DIAGNOSIS — Z79899 Other long term (current) drug therapy: Secondary | ICD-10-CM | POA: Insufficient documentation

## 2014-03-07 DIAGNOSIS — R110 Nausea: Secondary | ICD-10-CM | POA: Insufficient documentation

## 2014-03-07 LAB — COMPREHENSIVE METABOLIC PANEL
ALBUMIN: 3.5 g/dL (ref 3.5–5.2)
ALT: 22 U/L (ref 0–35)
AST: 24 U/L (ref 0–37)
Alkaline Phosphatase: 66 U/L (ref 39–117)
BUN: 9 mg/dL (ref 6–23)
CALCIUM: 9 mg/dL (ref 8.4–10.5)
CHLORIDE: 102 meq/L (ref 96–112)
CO2: 27 mEq/L (ref 19–32)
CREATININE: 0.73 mg/dL (ref 0.50–1.10)
GFR calc Af Amer: >90 mL/min (ref >90)
GFR calc non Af Amer: >90 mL/min (ref >90)
Glucose, Bld: 95 mg/dL (ref 70–99)
Potassium: 3.7 mEq/L (ref 3.7–5.3)
Sodium: 143 mEq/L (ref 137–147)
TOTAL PROTEIN: 7.4 g/dL (ref 6.0–8.3)

## 2014-03-07 LAB — CBC WITH DIFFERENTIAL/PLATELET
BASOS ABS: 0 10*3/uL (ref 0.0–0.1)
BASOS PCT: 1 % (ref 0–1)
EOS ABS: 0.3 10*3/uL (ref 0.0–0.7)
EOS PCT: 4 % (ref 0–5)
HEMATOCRIT: 40.5 % (ref 36.0–46.0)
HEMOGLOBIN: 13.6 g/dL (ref 12.0–15.0)
Lymphocytes Relative: 39 % (ref 12–46)
Lymphs Abs: 2.7 10*3/uL (ref 0.7–4.0)
MCH: 29.4 pg (ref 26.0–34.0)
MCHC: 33.6 g/dL (ref 30.0–36.0)
MCV: 87.7 fL (ref 78.0–100.0)
MONO ABS: 0.4 10*3/uL (ref 0.1–1.0)
MONOS PCT: 7 % (ref 3–12)
NEUTROS ABS: 3.4 10*3/uL (ref 1.7–7.7)
Neutrophils Relative %: 50 % (ref 43–77)
Platelets: 403 10*3/uL — ABNORMAL HIGH (ref 150–400)
RBC: 4.62 MIL/uL (ref 3.87–5.11)
RDW: 14 % (ref 11.5–15.5)
WBC: 6.8 10*3/uL (ref 4.0–10.5)

## 2014-03-07 LAB — AMYLASE: AMYLASE: 53 U/L (ref 0–105)

## 2014-03-07 LAB — TROPONIN I

## 2014-03-07 LAB — LIPASE, BLOOD: LIPASE: 90 U/L — AB (ref 11–59)

## 2014-03-07 MED ORDER — ONDANSETRON HCL 4 MG PO TABS: 4.0000 mg | ORAL_TABLET | Freq: Four times a day (QID) | ORAL | Status: DC

## 2014-03-07 MED ORDER — IOHEXOL 300 MG/ML  SOLN
50.0000 mL | Freq: Once | INTRAMUSCULAR | Status: AC | PRN
  Administered 2014-03-07: 50 mL via ORAL

## 2014-03-07 MED ORDER — IOHEXOL 300 MG/ML  SOLN
100.0000 mL | Freq: Once | INTRAMUSCULAR | Status: AC | PRN
  Administered 2014-03-07: 100 mL via INTRAVENOUS

## 2014-03-07 MED ORDER — ONDANSETRON HCL 4 MG/2ML IJ SOLN
4.0000 mg | Freq: Once | INTRAMUSCULAR | Status: AC
  Administered 2014-03-07: 4 mg via INTRAVENOUS
  Filled 2014-03-07: qty 2

## 2014-03-07 MED ORDER — ONDANSETRON 4 MG PO TBDP
4.0000 mg | ORAL_TABLET | Freq: Once | ORAL | Status: AC
  Administered 2014-03-07: 4 mg via ORAL
  Filled 2014-03-07: qty 1

## 2014-03-07 MED ORDER — OXYCODONE-ACETAMINOPHEN 5-325 MG PO TABS
2.0000 | ORAL_TABLET | Freq: Once | ORAL | Status: AC
  Administered 2014-03-07: 2 via ORAL
  Filled 2014-03-07: qty 2

## 2014-03-07 MED ORDER — OMEPRAZOLE 20 MG PO CPDR: 20.0000 mg | DELAYED_RELEASE_CAPSULE | Freq: Every day | ORAL | Status: DC

## 2014-03-07 MED ORDER — MORPHINE SULFATE 4 MG/ML IJ SOLN
4.0000 mg | Freq: Once | INTRAMUSCULAR | Status: AC
  Administered 2014-03-07: 4 mg via INTRAVENOUS
  Filled 2014-03-07: qty 1

## 2014-03-07 NOTE — ED Notes (Signed)
"  sharp abd pain and I can't swallow".  For 3 days. Alert,

## 2014-03-07 NOTE — Discharge Instructions (Signed)
Abdominal Pain, Adult Take your medications as prescribed and followup with Dr. Gala Romney. Return to the ED if you develop new or worsening symptoms. Many things can cause abdominal pain. Usually, abdominal pain is not caused by a disease and will improve without treatment. It can often be observed and treated at home. Your health care provider will do a physical exam and possibly order blood tests and X-rays to help determine the seriousness of your pain. However, in many cases, more time must pass before a clear cause of the pain can be found. Before that point, your health care provider may not know if you need more testing or further treatment. HOME CARE INSTRUCTIONS  Monitor your abdominal pain for any changes. The following actions may help to alleviate any discomfort you are experiencing:  Only take over-the-counter or prescription medicines as directed by your health care provider.  Do not take laxatives unless directed to do so by your health care provider.  Try a clear liquid diet (broth, tea, or water) as directed by your health care provider. Slowly move to a bland diet as tolerated. SEEK MEDICAL CARE IF:  You have unexplained abdominal pain.  You have abdominal pain associated with nausea or diarrhea.  You have pain when you urinate or have a bowel movement.  You experience abdominal pain that wakes you in the night.  You have abdominal pain that is worsened or improved by eating food.  You have abdominal pain that is worsened with eating fatty foods. SEEK IMMEDIATE MEDICAL CARE IF:   Your pain does not go away within 2 hours.  You have a fever.  You keep throwing up (vomiting).  Your pain is felt only in portions of the abdomen, such as the right side or the left lower portion of the abdomen.  You pass bloody or black tarry stools. MAKE SURE YOU:  Understand these instructions.   Will watch your condition.   Will get help right away if you are not doing well or  get worse.  Document Released: 09/21/2005 Document Revised: 10/02/2013 Document Reviewed: 08/21/2013 [facility] Patient Information 2014 [place].

## 2014-03-07 NOTE — ED Provider Notes (Signed)
CSN: [id number]     Arrival date & time 03/07/14  1433 History   First MD Initiated Contact with Patient 03/07/14 1820     Chief Complaint  Patient presents with  . Abdominal Pain     (Consider location/radiation/quality/duration/timing/severity/associated sxs/prior Treatment) HPI Comments: Patient complains of a three-day history of constant upper and lower abdominal pain typical of her previous episodes of pancreatitis. She states the pain is sharp and stabbing was worse last night and is today. Associated with nausea. She feels that she's having difficulty swallowing and wonders whether she needs esophageal dilation. Her last EGD was in April 2014 which showed "EGD with dilation of esophageal and gastric biopsy. Impression is severe ulcerative/erosive reflux esophagitis with soft stricture. Hx of Barrett's esophagus and esophageal ulceration". She is noncompliant with her protonic due to cost. She states the pain is similar to what she's had in the past. Denies alcohol abuse. Denies any fever. Denies any change in bowel habits.  The history is provided by the patient.    Past Medical History  Diagnosis Date  . Pancreatitis   . Peptic ulcer   . GERD (gastroesophageal reflux disease)   . Hiatal hernia   . Chronic back pain   . Chronic abdominal pain   . Barrett's esophagus with esophagitis 03/26/2013  . Tobacco use 03/26/2013   Past Surgical History  Procedure Laterality Date  . Cholecystectomy    . Cesarean section    . Tubal ligation    . Esophagogastroduodenoscopy  Oct 2011    Dr. Laural Golden: ulcer in distal esophagus, soft stricture at GE junction s/p balloon dilation PATH: BARRETT'S  . Esophagogastroduodenoscopy (egd) with esophageal dilation N/A 03/27/2013    Procedure: ESOPHAGOGASTRODUODENOSCOPY (EGD) WITH ESOPHAGEAL DILATION;  Surgeon: Daneil Dolin, MD;  [facility]: [facility];  Service: Endoscopy;  Laterality: N/A;  possible dilation  . Eus N/A 05/09/2013    Procedure: UPPER  ENDOSCOPIC ULTRASOUND (EUS) LINEAR;  Surgeon: Milus Banister, MD;  Location: [facility];  Service: Endoscopy;  Laterality: N/A;   Family History  Problem Relation Age of Onset  . Asthma Mother   . Heart failure Mother   . Cancer Mother   . Diabetes Mother   . Hypertension Mother   . Stroke Mother   . Heart failure Father   . Diabetes Father   . Colon cancer Neg Hx   . Pancreatic cancer Mother     deceased   History  Substance Use Topics  . Smoking status: Current Every Day Smoker -- 0.50 packs/day for 20 years    Types: Cigarettes  . Smokeless tobacco: Never Used  . Alcohol Use: No   OB History   Grav Para Term Preterm Abortions TAB SAB Ect Mult Living   4 3 3  1  1   3      Review of Systems  Constitutional: Positive for activity change and appetite change. Negative for fever and fatigue.  HENT: Negative for congestion and rhinorrhea.   Eyes: Negative for visual disturbance.  Respiratory: Negative for cough, chest tightness and shortness of breath.   Cardiovascular: Negative for chest pain.  Gastrointestinal: Positive for nausea and abdominal pain. Negative for vomiting.  Genitourinary: Negative for dysuria, hematuria, vaginal bleeding and vaginal discharge.  Musculoskeletal: Negative for arthralgias and myalgias.  Skin: Negative for rash.  Neurological: Negative for dizziness, weakness and headaches.  A complete 10 system review of systems was obtained and all systems are negative except as noted in the HPI and  PMH.      Allergies  Hydrocodone and Ibuprofen  Home Medications   Current Outpatient Rx  Name  Route  Sig  Dispense  Refill  . acetaminophen (TYLENOL) 500 MG tablet   Oral   Take 1,000 mg by mouth every 6 (six) hours as needed for pain.         Marland Kitchen aspirin 325 MG tablet   Oral   Take 650 mg by mouth once. Chest pain         . Simethicone (GAS-X EXTRA STRENGTH PO)   Oral   Take 2 tablets by mouth daily as needed. Gas/indigestion         .  omeprazole (PRILOSEC) 20 MG capsule   Oral   Take 1 capsule (20 mg total) by mouth daily.   30 capsule   0   . ondansetron (ZOFRAN) 4 MG tablet   Oral   Take 1 tablet (4 mg total) by mouth every 6 (six) hours.   12 tablet   0    BP 134/95  Pulse 79  Temp(Src) 98.4 F (36.9 C) (Oral)  Resp 20  SpO2 98%  LMP 02/09/2012 Physical Exam  Constitutional: She is oriented to person, place, and time. She appears well-developed and well-nourished. No distress.  HENT:  Head: Normocephalic and atraumatic.  Mouth/Throat: Oropharynx is clear and moist.  Eyes: Conjunctivae and EOM are normal. Pupils are equal, round, and reactive to light.  Neck: Normal range of motion. Neck supple.  Cardiovascular: Normal rate, regular rhythm and normal heart sounds.   Pulmonary/Chest: Effort normal. No respiratory distress.  Abdominal: Soft. There is tenderness. There is no rebound and no guarding.  Diffuse tenderness, no peritoneal signs  Musculoskeletal: Normal range of motion. She exhibits no edema and no tenderness.  Neurological: She is alert and oriented to person, place, and time. No cranial nerve deficit. She exhibits normal muscle tone. Coordination normal.  Skin: Skin is warm.    ED Course  Procedures (including critical care time) Labs Review Labs Reviewed  CBC WITH DIFFERENTIAL - Abnormal; Notable for the following:    Platelets 403 (*)    All other components within normal limits  COMPREHENSIVE METABOLIC PANEL - Abnormal; Notable for the following:    Total Bilirubin <0.2 (*)    All other components within normal limits  LIPASE, BLOOD - Abnormal; Notable for the following:    Lipase 90 (*)    All other components within normal limits  AMYLASE  TROPONIN I   Imaging Review Ct Abdomen Pelvis W Contrast  03/07/2014   CLINICAL DATA:  Periumbilical abdominal pain, onset this morning  EXAM: CT ABDOMEN AND PELVIS WITH CONTRAST  TECHNIQUE: Multidetector CT imaging of the abdomen and pelvis  was performed using the standard protocol following bolus administration of intravenous contrast.  CONTRAST:  79mL OMNIPAQUE IOHEXOL 300 MG/ML SOLN, 144mL OMNIPAQUE IOHEXOL 300 MG/ML SOLN  COMPARISON:  DG ABD ACUTE W/CHEST dated 03/07/2014; CT ABD - PELV W/ CM dated 07/23/2013; CT ABD - PELV W/ CM dated 10/23/2012  FINDINGS: Minimal bibasilar dependent atelectasis is noted.  Hepatic hypodensity again suggests steatosis without focal abnormality. Cholecystectomy clips are noted. Spleen, pancreas, adrenal glands are unremarkable. Right lower renal pole too small to characterize 5 mm hypodense lesion is most compatible with a cyst. Left mid renal cortical 1.5 cm hypodense lesion measures just above typical fluid density, 23 Hounsfield units, but is unchanged in size and previously measured low attenuation, suggesting this represents volume averaging or  a small amount of protein or hemorrhage. No hydroureteronephrosis or radiopaque renal or ureteral calculus is identified.  No free air. No ascites or lymphadenopathy. No bowel wall thickening or focal segmental dilatation. Uterus and ovaries are normal. The appendix is normal. Mild atheromatous aortic calcification noted without aneurysm. Left acetabular bone island incidentally noted. No acute osseous finding.  IMPRESSION: No acute intra-abdominal or pelvic pathology.   Electronically Signed   By: Conchita Paris M.D.   On: 03/07/2014 20:45   Dg Abd Acute W/chest  03/07/2014   CLINICAL DATA:  Abdominal pain and vomiting.  EXAM: ACUTE ABDOMEN SERIES (ABDOMEN 2 VIEW & CHEST 1 VIEW)  COMPARISON:  09/04/2013.  FINDINGS: The upright chest is unremarkable. No infiltrates or effusions. The heart is normal in size.  Two views of the abdomen demonstrate moderate air and stool throughout the colon and down into the rectum. There also air-filled loops of small bowel in the central abdomen with scattered air-fluid levels. Findings could be due to an early or partial small bowel  obstruction or gastroenteritis. No free air. The soft tissue shadows are maintained. The bony structures are intact.  IMPRESSION: Nonspecific bowel gas pattern could reflect an early or partial small bowel obstruction or gastroenteritis.   Electronically Signed   By: Kalman Jewels M.D.   On: 03/07/2014 19:05     EKG Interpretation   Date/Time:  Friday March 07 2014 18:37:32 EDT Ventricular Rate:  68 PR Interval:  188 QRS Duration: 144 QT Interval:  474 QTC Calculation: 504 R Axis:   30 Text Interpretation:  Normal sinus rhythm Right bundle branch block  Abnormal ECG When compared with ECG of 01-May-2012 12:31, Right bundle  branch block is now Present new RBBB Confirmed by Wyvonnia Dusky  MD, Marguerette Sheller  [phone]) on 03/07/2014 6:46:46 PM      MDM   Final diagnoses:  Chronic abdominal pain   Three-day history of abdominal pain typical of previous episodes of pancreatitis. History of ulcers, GERD and Barrett's esophagus. Noncompliant with PPI.  Abdomen soft without peritoneal signs. Lipase mildly elevated at 90. Labs otherwise unremarkable. X-ray shows possible ileus versus early small bowel obstruction.  This is not confirmed on CT. CT is normal. No acute findings. Patient is tolerating by mouth in the ED. She's not had any vomiting. She is instructed that she needs to restart her PPI. She will followup with her PCP and with Dr. Gala Romney. She is swallow without problem and there is no indication for emergent endoscopy tonight.   Date: 03/07/2014  Rate: 68  Rhythm: normal sinus rhythm  QRS Axis: normal  Intervals: normal  ST/T Wave abnormalities: nonspecific ST/T changes  Conduction Disutrbances:right bundle branch block  Narrative Interpretation:   Old EKG Reviewed: changes noted    Ezequiel Essex, MD 03/07/14 2146

## 2014-03-07 NOTE — ED Notes (Signed)
Patient given Coke to drink at this time. Offered Sprite states that it givers her heartburn. Patient is tearful at this time.

## 2014-03-11 ENCOUNTER — Emergency Department (HOSPITAL_COMMUNITY)
Admission: EM | Admit: 2014-03-11 | Discharge: 2014-03-11 | Disposition: A | Discharge status: Home / Self Care | Payer: Self-pay | Attending: Emergency Medicine | Admitting: Emergency Medicine

## 2014-03-11 ENCOUNTER — Encounter (HOSPITAL_COMMUNITY): Payer: Self-pay | Admitting: Emergency Medicine

## 2014-03-11 DIAGNOSIS — G8929 Other chronic pain: Secondary | ICD-10-CM | POA: Insufficient documentation

## 2014-03-11 DIAGNOSIS — R109 Unspecified abdominal pain: Secondary | ICD-10-CM | POA: Insufficient documentation

## 2014-03-11 DIAGNOSIS — E876 Hypokalemia: Secondary | ICD-10-CM | POA: Insufficient documentation

## 2014-03-11 DIAGNOSIS — R112 Nausea with vomiting, unspecified: Secondary | ICD-10-CM | POA: Insufficient documentation

## 2014-03-11 DIAGNOSIS — F17200 Nicotine dependence, unspecified, uncomplicated: Secondary | ICD-10-CM | POA: Insufficient documentation

## 2014-03-11 DIAGNOSIS — K279 Peptic ulcer, site unspecified, unspecified as acute or chronic, without hemorrhage or perforation: Secondary | ICD-10-CM | POA: Insufficient documentation

## 2014-03-11 DIAGNOSIS — Z79899 Other long term (current) drug therapy: Secondary | ICD-10-CM | POA: Insufficient documentation

## 2014-03-11 DIAGNOSIS — K219 Gastro-esophageal reflux disease without esophagitis: Secondary | ICD-10-CM | POA: Insufficient documentation

## 2014-03-11 LAB — BASIC METABOLIC PANEL
BUN: 10 mg/dL (ref 6–23)
CO2: 24 mEq/L (ref 19–32)
Calcium: 9.5 mg/dL (ref 8.4–10.5)
Chloride: 101 mEq/L (ref 96–112)
Creatinine, Ser: 0.8 mg/dL (ref 0.50–1.10)
GFR, EST NON AFRICAN AMERICAN: 86 mL/min — AB (ref >90)
Glucose, Bld: 150 mg/dL — ABNORMAL HIGH (ref 70–99)
Potassium: 2.9 mEq/L — CL (ref 3.7–5.3)
SODIUM: 141 meq/L (ref 137–147)

## 2014-03-11 MED ORDER — ONDANSETRON HCL 4 MG/2ML IJ SOLN
4.0000 mg | Freq: Once | INTRAMUSCULAR | Status: AC
  Administered 2014-03-11: 4 mg via INTRAVENOUS
  Filled 2014-03-11: qty 2

## 2014-03-11 MED ORDER — DIPHENHYDRAMINE HCL 50 MG/ML IJ SOLN
25.0000 mg | Freq: Once | INTRAMUSCULAR | Status: AC
  Administered 2014-03-11: 25 mg via INTRAVENOUS
  Filled 2014-03-11: qty 1

## 2014-03-11 MED ORDER — METOCLOPRAMIDE HCL 5 MG/ML IJ SOLN
10.0000 mg | Freq: Once | INTRAMUSCULAR | Status: AC
  Administered 2014-03-11: 10 mg via INTRAVENOUS
  Filled 2014-03-11: qty 2

## 2014-03-11 MED ORDER — SODIUM CHLORIDE 0.9 % IV SOLN
1000.0000 mL | Freq: Once | INTRAVENOUS | Status: AC
  Administered 2014-03-11: 1000 mL via INTRAVENOUS

## 2014-03-11 MED ORDER — POTASSIUM CHLORIDE 10 MEQ/100ML IV SOLN
10.0000 meq | Freq: Once | INTRAVENOUS | Status: AC
  Administered 2014-03-11: 10 meq via INTRAVENOUS
  Filled 2014-03-11: qty 100

## 2014-03-11 MED ORDER — ONDANSETRON HCL 4 MG/2ML IJ SOLN: 4.0000 mg | Freq: Once | INTRAMUSCULAR | Status: DC

## 2014-03-11 MED ORDER — PANTOPRAZOLE SODIUM 40 MG IV SOLR
40.0000 mg | Freq: Once | INTRAVENOUS | Status: AC
  Administered 2014-03-11: 40 mg via INTRAVENOUS
  Filled 2014-03-11: qty 40

## 2014-03-11 MED ORDER — POTASSIUM CHLORIDE CRYS ER 20 MEQ PO TBCR
40.0000 meq | EXTENDED_RELEASE_TABLET | Freq: Once | ORAL | Status: AC
  Administered 2014-03-11: 40 meq via ORAL
  Filled 2014-03-11: qty 2

## 2014-03-11 MED ORDER — SODIUM CHLORIDE 0.9 % IV SOLN: 1000.0000 mL | INTRAVENOUS | Status: DC

## 2014-03-11 MED ORDER — ACETAMINOPHEN 325 MG PO TABS
650.0000 mg | ORAL_TABLET | Freq: Once | ORAL | Status: AC
  Administered 2014-03-11: 650 mg via ORAL
  Filled 2014-03-11: qty 2

## 2014-03-11 NOTE — ED Notes (Addendum)
CRITICAL VALUE ALERT  Critical value received:  Potassium 2.9  Date of notification:  03/11/2014  Time of notification:  0805  Critical value read back:yes  Nurse who received alert:  Domenica Reamer RN  MD notified (1st page):  Dr Christy Gentles  Time of first page:  0805  MD notified (2nd page):  Time of second page:  Responding MD:  Dr Christy Gentles  Time MD responded:  [phone]

## 2014-03-11 NOTE — ED Notes (Signed)
Patient reports nausea returning after taking potassium pills. EDP made aware, verbal order given for zofran.

## 2014-03-11 NOTE — ED Provider Notes (Signed)
CSN: [id number]     Arrival date & time 03/11/14  8841 History   First MD Initiated Contact with Patient 03/11/14 [phone]     Chief Complaint  Patient presents with  . Emesis     Patient is a 48 y.o. female presenting with vomiting. The history is provided by the patient.  Emesis Severity:  Moderate Duration:  1 day Timing:  Intermittent Progression:  Worsening Chronicity:  Recurrent Relieved by:  Nothing Worsened by:  Liquids Associated symptoms: abdominal pain   Associated symptoms: no diarrhea   pt reports she has had multiple episodes of nonbloody vomiting since yesterday No diarrhea She reports epigastric abd pain No cp No new SOB No fever This is similar to prior episodes of abd pain/vomiting She denies ETOH use  She denies vag bleeding/disharge/dysuria Reports she hasn't had a period in "years"   Past Medical History  Diagnosis Date  . Pancreatitis   . Peptic ulcer   . GERD (gastroesophageal reflux disease)   . Hiatal hernia   . Chronic back pain   . Chronic abdominal pain   . Barrett's esophagus with esophagitis 03/26/2013  . Tobacco use 03/26/2013   Past Surgical History  Procedure Laterality Date  . Cholecystectomy    . Cesarean section    . Tubal ligation    . Esophagogastroduodenoscopy  Oct 2011    Dr. Laural Golden: ulcer in distal esophagus, soft stricture at GE junction s/p balloon dilation PATH: BARRETT'S  . Esophagogastroduodenoscopy (egd) with esophageal dilation N/A 03/27/2013    Procedure: ESOPHAGOGASTRODUODENOSCOPY (EGD) WITH ESOPHAGEAL DILATION;  Surgeon: Daneil Dolin, MD;  [facility];  Service: Endoscopy;  Laterality: N/A;  possible dilation  . Eus N/A 05/09/2013    Procedure: UPPER ENDOSCOPIC ULTRASOUND (EUS) LINEAR;  Surgeon: Milus Banister, MD;  Location: [facility];  Service: Endoscopy;  Laterality: N/A;   Family History  Problem Relation Age of Onset  . Asthma Mother   . Heart failure Mother   . Cancer Mother   . Diabetes Mother    . Hypertension Mother   . Stroke Mother   . Heart failure Father   . Diabetes Father   . Colon cancer Neg Hx   . Pancreatic cancer Mother     deceased   History  Substance Use Topics  . Smoking status: Current Every Day Smoker -- 0.50 packs/day for 20 years    Types: Cigarettes  . Smokeless tobacco: Never Used  . Alcohol Use: No   OB History   Grav Para Term Preterm Abortions TAB SAB Ect Mult Living   4 3 3  1  1   3      Review of Systems  Constitutional: Negative for fever.  Cardiovascular: Negative for chest pain.  Gastrointestinal: Positive for vomiting and abdominal pain. Negative for diarrhea.  Genitourinary: Negative for dysuria, vaginal bleeding and vaginal discharge.  Neurological: Negative for weakness.  All other systems reviewed and are negative.      Allergies  Hydrocodone and Ibuprofen  Home Medications   Current Outpatient Rx  Name  Route  Sig  Dispense  Refill  . acetaminophen (TYLENOL) 500 MG tablet   Oral   Take 1,000 mg by mouth every 6 (six) hours as needed for pain.         Marland Kitchen omeprazole (PRILOSEC) 20 MG capsule   Oral   Take 1 capsule (20 mg total) by mouth daily.   30 capsule   0   . ondansetron (ZOFRAN) 4  MG tablet   Oral   Take 1 tablet (4 mg total) by mouth every 6 (six) hours.   12 tablet   0   . Simethicone (GAS-X EXTRA STRENGTH PO)   Oral   Take 2 tablets by mouth daily as needed. Gas/indigestion          BP 155/104  Pulse 78  Temp(Src) 98.1 F (36.7 C) (Oral)  Resp 15  Ht 5\' 7"  (1.702 m)  Wt 190 lb (86.183 kg)  BMI 29.75 kg/m2  SpO2 99%  LMP 02/09/2012 Physical Exam CONSTITUTIONAL: Well developed/well nourished HEAD: Normocephalic/atraumatic EYES: EOMI/PERRL, no icterus ENMT: Mucous membranes dry NECK: supple no meningeal signs SPINE:entire spine nontender CV: S1/S2 noted, no murmurs/rubs/gallops noted LUNGS: Lungs are clear to auscultation bilaterally, no apparent distress ABDOMEN: soft, mild epigastric  tenderness, +Bs noted in abdomen, no rebound or guarding GU:no cva tenderness NEURO: Pt is awake/alert, moves all extremitiesx4 EXTREMITIES: pulses normal, full ROM SKIN: warm, color normal PSYCH: no abnormalities of mood noted   ED Course  Procedures  9:09 AM Pt with repeat ER visits last seen on 03/07/14 with workup including labs/imaging that were negative Pt admits to long h/o frequent episodes Defer imaging today Labs indicate mild hypoKalemia (qt mildly prolonged) Oral and IV KCL ordered Pt is without signs of acute abdomen I doubt ACS as cause Will need f/u as outpatient Pt tolerated in PO in the ED  Labs Review Labs Reviewed  BASIC METABOLIC PANEL - Abnormal; Notable for the following:    Potassium 2.9 (*)    Glucose, Bld 150 (*)    GFR calc non Af Amer 86 (*)    All other components within normal limits     EKG Interpretation   Date/Time:  Tuesday March 11 2014 07:24:05 EDT Ventricular Rate:  59 PR Interval:  146 QRS Duration: 90 QT Interval:  480 QTC Calculation: 475 R Axis:   35 Text Interpretation:  Sinus bradycardia Nonspecific T wave abnormality  Prolonged QT Abnormal ECG When compared with ECG of 07-Mar-2014 18:37,  Right bundle branch block is no longer Present Confirmed by Christy Gentles  MD,  Elenore Rota ([id number]) on 03/11/2014 7:31:24 AM      MDM   Final diagnoses:  Abdominal pain  Nausea and vomiting  Hypokalemia   Nursing notes including past medical history and social history reviewed and considered in documentation Labs/vital reviewed and considered Previous records reviewed and considered     Sharyon Cable, MD 03/11/14 [phone]

## 2014-03-11 NOTE — ED Notes (Signed)
Pt reports vomiting worse since yesterday. States has taking all the medication she got the other day.

## 2014-03-11 NOTE — Discharge Instructions (Signed)
°  SEEK IMMEDIATE MEDICAL ATTENTION IF: °The pain does not go away or becomes severe, particularly over the next 8-12 hours.  °A temperature above 100.4F develops.  °The pain becomes localized to portions of the abdomen. The right side could possibly be appendicitis. In an adult, the left lower portion of the abdomen could be colitis or diverticulitis.  °Blood is being passed in stools or vomit (bright red or black tarry stools).  °Return also if you develop chest pain, difficulty breathing, dizziness or fainting, or become confused, poorly responsive, or inconsolable. ° °

## 2014-03-11 NOTE — ED Notes (Signed)
Patient requesting something for pain. EDP made aware, verbal order given for tylenol.

## 2014-03-11 NOTE — Care Management Note (Signed)
ED/CM noted patient did not have health insurance and/or PCP listed in the computer.  Patient was given the [place] resource handout with information on the clinics, food pantries, and the handout for new health insurance sign-up.  Patient expressed appreciation for information received. 

## 2014-10-27 ENCOUNTER — Encounter (HOSPITAL_COMMUNITY): Payer: Self-pay | Admitting: Emergency Medicine

## 2014-11-17 ENCOUNTER — Encounter (HOSPITAL_COMMUNITY): Payer: Self-pay

## 2014-11-17 ENCOUNTER — Emergency Department (HOSPITAL_COMMUNITY)
Admission: EM | Admit: 2014-11-17 | Discharge: 2014-11-17 | Disposition: A | Discharge status: Home / Self Care | Payer: Self-pay | Attending: Emergency Medicine | Admitting: Emergency Medicine

## 2014-11-17 DIAGNOSIS — Z720 Tobacco use: Secondary | ICD-10-CM | POA: Insufficient documentation

## 2014-11-17 DIAGNOSIS — K0889 Other specified disorders of teeth and supporting structures: Secondary | ICD-10-CM

## 2014-11-17 DIAGNOSIS — K219 Gastro-esophageal reflux disease without esophagitis: Secondary | ICD-10-CM | POA: Insufficient documentation

## 2014-11-17 DIAGNOSIS — K029 Dental caries, unspecified: Secondary | ICD-10-CM | POA: Insufficient documentation

## 2014-11-17 DIAGNOSIS — Z8711 Personal history of peptic ulcer disease: Secondary | ICD-10-CM | POA: Insufficient documentation

## 2014-11-17 DIAGNOSIS — K088 Other specified disorders of teeth and supporting structures: Secondary | ICD-10-CM | POA: Insufficient documentation

## 2014-11-17 DIAGNOSIS — G8929 Other chronic pain: Secondary | ICD-10-CM | POA: Insufficient documentation

## 2014-11-17 MED ORDER — HYDROCODONE-ACETAMINOPHEN 5-325 MG PO TABS: ORAL_TABLET | ORAL | Status: DC

## 2014-11-17 MED ORDER — AMOXICILLIN 500 MG PO CAPS: 500.0000 mg | ORAL_CAPSULE | Freq: Three times a day (TID) | ORAL | Status: DC

## 2014-11-17 NOTE — Discharge Instructions (Signed)
Dental Pain °Toothache is pain in or around a tooth. It may get worse with chewing or with cold or heat.  °HOME CARE °· Your dentist may use a numbing medicine during treatment. If so, you may need to avoid eating until the medicine wears off. Ask your dentist about this. °· Only take medicine as told by your dentist or doctor. °· Avoid chewing food near the painful tooth until after all treatment is done. Ask your dentist about this. °GET HELP RIGHT AWAY IF:  °· The problem gets worse or new problems appear. °· You have a fever. °· There is redness and puffiness (swelling) of the face, jaw, or neck. °· You cannot open your mouth. °· There is pain in the jaw. °· There is very bad pain that is not helped by medicine. °MAKE SURE YOU:  °· Understand these instructions. °· Will watch your condition. °· Will get help right away if you are not doing well or get worse. °Document Released: 05/30/2008 Document Revised: 03/05/2012 Document Reviewed: 05/30/2008 °ExitCare® Patient Information ©2015 [employer]. This information is not intended to replace advice given to you by your health care provider. Make sure you discuss any questions you have with your health care provider. ° ° ° °Emergency Department Resource Guide °1) Find a Doctor and Pay Out of Pocket °Although you won't have to find out who is covered by your insurance plan, it is a good idea to ask around and get recommendations. You will then need to call the office and see if the doctor you have chosen will accept you as a new patient and what types of options they offer for patients who are self-pay. Some doctors offer discounts or will set up payment plans for their patients who do not have insurance, but you will need to ask so you aren't surprised when you get to your appointment. ° °2) Contact Your Local Health Department °Not all health departments have doctors that can see patients for sick visits, but many do, so it is worth a call to see if yours does.  If you don't know where your local health department is, you can check in your phone book. The CDC also has a tool to help you locate your state's health department, and many state websites also have listings of all of their local health departments. ° °3) Find a Walk-in Clinic °If your illness is not likely to be very severe or complicated, you may want to try a walk in clinic. These are popping up all over the country in pharmacies, drugstores, and shopping centers. They're usually staffed by nurse practitioners or physician assistants that have been trained to treat common illnesses and complaints. They're usually fairly quick and inexpensive. However, if you have serious medical issues or chronic medical problems, these are probably not your best option. ° °No Primary Care Doctor: °- Call [facility] at  832-8000 - they can help you locate a primary care doctor that  accepts your insurance, provides certain services, etc. °- Physician Referral Service- 1-800-533-3463 ° °Chronic Pain Problems: °Organization         Address  Phone   Notes  °[facility]  (336) 297-2271 Patients need to be referred by their primary care doctor.  ° °Medication Assistance: °Organization         Address  Phone   Notes  °[place] [facility] [address] °[place], [place] [address] (336) 641-8030 --Must be a resident   of [place] °-- Must have NO insurance coverage whatsoever (no Medicaid/ Medicare, etc.) °-- The pt. MUST have a primary care doctor that directs their care regularly and follows them in the community °  °[facility]  (866) 331-1348   °[employer]  (888) 892-1162   ° °Agencies that provide inexpensive medical care: °Organization         Address  Phone   Notes  °[facility]  (336) 832-8035   °[facility]    (336) 832-7272   °[facility] [address] °[place], [place] [address] (336) 832-4777   °[facility] [place] [facility] [address], °[place] (336) 271-4999   °[facility]    (336) 373-0678   °[facility]    (336) 272-1050   °[facility] ° [address], [place] Phone:  (336) 832-4444, Fax:  (336) 832-4440 Hours of Operation:  9 am - 6 pm, M-F.  Also accepts Medicaid/Medicare and self-pay.  °[facility] ° [address], [place] Phone: (336) 832-3150, Fax: (336) 832-3151. Hours of Operation:  8:30 am - 5:30 pm, M-F.  Also accepts Medicaid and self-pay.  °[facility] [place] [address], [place] Phone: (336) 878-6027   °[facility] [address], [place], [place] (336)723-1848, Ext. 123 Mondays & Thursdays: 7-9 AM.  First 15 patients are seen on a first come, first serve basis. °  ° °Medicaid-accepting [place] Providers: ° °Organization         Address  Phone   Notes  °[facility] [address], [place] (336) 641-2100 Also accepts self-pay patients.  °[facility] [address], [place] ° (336) 856-9996   °[facility] [address], [place] (336) 288-8857   °[facility] [address] [place] [address], [place] (336) 299-7000   °Veita Bland [address], [place]  ° (336) 373-1557 Only accepts [place] Access Medicaid patients after they have their name applied to their card.  ° °Self-Pay (no insurance) in [place]: ° °Organization         Address  Phone   Notes  °Sickle Cell Patients, [facility] [address], [place] (336) 832-1970   °[facility] [address], [place] (336) 832-4400   °[facility] ° [address], [place] (336) 992-4800   °[facility]/Dr. Osei-Bonsu ° [address] [place] [address], [place] or [address], [place] (336) 841-8500  Phone number for both [place] and [place] locations is the same.  °[facility] [address], [place] (336) 299-0000   °[facility] [address] [place] [address], [place] or [address] (336) 852-7530 °(336) 878-2260   °[facility] [address], [place] (336) 350-1642, phone; (336) 294-5005, fax Sees patients 1st and 3rd Saturday of every month.  Must not qualify for public or private insurance (i.e. Medicaid, Medicare, [place] Health Choice, Veterans' Benefits) • Household income should be no more than 200% of the poverty level •The clinic cannot treat you if you are pregnant or think you are pregnant • Sexually transmitted diseases are not treated at the clinic.  ° ° °Dental Care: °Organization         Address    Phone  Notes  °[facility] [address], [place] (336) 641-6152 Accepts children up to age 21 who are enrolled in Medicaid or [place] Health Choice; pregnant women with a Medicaid card; and children who have applied for Medicaid or [place] Health Choice, but were declined, whose parents can pay a reduced fee at time of service.  °[facility] [place]  [address], [place] (336) 641-7733 Accepts children up to age 21 who are enrolled in Medicaid or [place] Health Choice; pregnant women with a Medicaid card; and children who have applied for Medicaid or [place] Health Choice, but were declined, whose parents can pay a reduced fee at time of service.  °[facility] ° [address], [place] (336) 641-4533 Patients are seen by appointment only. Walk-ins are not accepted. [facility] will see patients 18 years of age and older. °Monday - Tuesday (8am-5pm) °Most Wednesdays (8:30-5pm) °$30 per visit, cash only  °[facility] ° [address], [place] (336) 641-4533 Patients are seen by appointment  only. Walk-ins are not accepted. [facility] will see patients 18 years of age and older. °One Wednesday Evening (Monthly: Volunteer Based).  $30 per visit, cash only  °[facility]  (919) 537-3737 for adults; Children under age 4, call Graduate Pediatric Dentistry at (919) [phone]. Children aged 4-14, please call (919) [phone] to request a pediatric application. ° Dental services are provided in all areas of dental care including fillings, crowns and bridges, complete and partial dentures, implants, gum treatment, root canals, and extractions. Preventive care is also provided. Treatment is provided to both adults and children. °Patients are selected via a lottery and there is often a waiting list. °  °[facility] [address], °[place] ° (336) 763-8833 [email or web address] °  °[facility] [address], [place], [place] (336)723-1848, Ext. 123 Second and Fourth Thursday of each month, opens at 6:30 AM; Clinic ends at 9 AM.  Patients are seen on a first-come first-served basis, and a limited number are seen during each clinic.  ° °[facility] ° [address], [place], [place] (336) 723-7904   Eligibility Requirements °You must have lived in [place], [place], or [place] counties for at least the last three months. °  You cannot be eligible for state or federal sponsored healthcare insurance, including Veterans Administration, Medicaid, or Medicare. °  You generally cannot be eligible for healthcare insurance through your employer.  °  How to apply: °Eligibility screenings are held every Tuesday and Wednesday afternoon from 1:00 pm until 4:00 pm. You do not need an appointment for the interview!  °[facility] [address], [place], [place] 336-631-2330   °[facility]  336-342-8273   °[facility]  336-703-3100   °[facility]  336-570-6415   ° °Behavioral Health  Resources in the Community: °Intensive Outpatient Programs °Organization         Address  Phone  Notes  °[facility] [address], [place], [place] 336-878-6098   °[facility] [address], [place], [place] 336-832-9800   °[facility] [address], [place], [place] ° 336-882-2125   °[facility] [address],  °, [place] 1-800-853-5163 or 336-641-4981   °Substance Abuse Resources °Organization           Address  Phone  Notes  °[facility]  336-882-2125   °[facility]  336-784-9470   °[facility]  336-285-9073   °[facility]  336-845-3988   °Residential & Outpatient Substance Abuse Program  1-800-659-3381   °Psychological Services °Organization         Address  Phone  Notes  °[facility]  336- 832-9600   °[facility]  336- 378-7881   °[facility] [address], [place] 1-800-853-5163 or 336-641-4981   ° °Mobile Crisis Teams °Organization         Address  Phone  Notes  °[facility], Mobile Crisis Care Unit  1-877-626-1772   °Assertive °[facility] ° [address] [place], [place] 336-834-9664   °Sharon DeEsch [address] °[place] 336-554-5454   ° °Self-Help/Support Groups °Organization         Address  Phone             Notes  °[facility] [place] [facility] - variety of support groups  336- 373-1402 Call for more information  °Narcotics Anonymous (NA), [facility] [address], °[place]  2 meetings at this location  ° °Residential Treatment Programs °Organization         Address  Phone  Notes  °[facility] [address],    °[place]  1-866-801-8205   °[facility] ° [address], [place], [place] 704-293-8524   °[facility] [address], [place] 336-845-3988 Admissions: 8am-3pm M-F  °[facility] [address]    °[place], [place] 336-841-1104   °[facility] [address], [place], [place] 336-379-7146   °[facility] [address]  °[place], [place] 336-285-9073   °[facility] Intensive Outpatient [address], [place], [place] 336-852-3033   °[facility] [address]  °[place], [place] 1-877-615-2722 or 336-784-9470   °[facility] [address] [place], [place] 336-227-7417 Accepts Medicaid  °[facility] [address]  °[place] 1-800-659-3381 Substance Abuse/Addiction Treatment  ° °[place] Behavioral Health Resources °Organization         Address  Phone  Notes  °[facility]  (888) 581-9988   °Julie Brannon, PhD [address] [place], [place]   (336) 349-5553 or (336) 951-0000   °[facility]   [address] °[place], [place] (336) 349-4454   °[facility] [address], [place], [place] (336) 342-8316 Insurance/Medicaid/sponsorship through [facility]  °[facility] [address]                                    [place], [place] (336) 342-8316 Therapy/tele-psych/case  °[facility] [address]  ° [place],  (336) 349-2233    °Dr. Arfeen  (336) 349-4544   °[facility] [place]  [employer] [facility] [place] [facility] 1) [address], [place] °2) [address], [place] °3)  [address], [place] (336) 349-3220 °(336) 342-7768 ° °(336) 342-8140   °[place] Child Abuse [facility] (336) 342-1394 or (336) 342-3537 (After Hours)    ° °  °

## 2014-11-17 NOTE — ED Notes (Signed)
Dental pain for "a long time" but worse for last 3 days.  Pt has mult dental caries and lost teeth.

## 2014-11-17 NOTE — ED Provider Notes (Signed)
CSN: [id number]     Arrival date & time 11/17/14  1325 History  This chart was scribed for non-physician practitioner Kem Parkinson, PA-C working with Nat Christen, MD by Zola Button, ED Scribe. This patient was seen in room APFT21/APFT21 and the patient's care was started at 2:34 PM.    Chief Complaint  Patient presents with  . Dental Pain    The history is provided by the patient. No language interpreter was used.   HPI Comments: Rita [name] is a 48 y.o. female with multiple dental caries and lost teeth who presents to the Emergency Department complaining of gradual onset dental pain that has been going on "for a while" but worsened 2 days ago. Patient also states that she has had a metallic taste in her mouth, and she thinks that her lower jaw has shifted outwards some due to her loss of her upper teeth. Patient denies fever, chills, neck pain or swelling or trouble swallowing. Patient has been trying to find an affordable dentist; she has had some trouble due to financial issues. Pain is worse by cold and heat.  Nothing makes it better   Past Medical History  Diagnosis Date  . Pancreatitis   . Peptic ulcer   . GERD (gastroesophageal reflux disease)   . Hiatal hernia   . Chronic back pain   . Chronic abdominal pain   . Barrett's esophagus with esophagitis 03/26/2013  . Tobacco use 03/26/2013   Past Surgical History  Procedure Laterality Date  . Cholecystectomy    . Cesarean section    . Tubal ligation    . Esophagogastroduodenoscopy  Oct 2011    Dr. Laural Golden: ulcer in distal esophagus, soft stricture at GE junction s/p balloon dilation PATH: BARRETT'S  . Esophagogastroduodenoscopy (egd) with esophageal dilation N/A 03/27/2013    Procedure: ESOPHAGOGASTRODUODENOSCOPY (EGD) WITH ESOPHAGEAL DILATION;  Surgeon: Daneil Dolin, MD;  [facility];  Service: Endoscopy;  Laterality: N/A;  possible dilation  . Eus N/A 05/09/2013    Procedure: UPPER ENDOSCOPIC ULTRASOUND (EUS) LINEAR;   Surgeon: Milus Banister, MD;  Location: [facility] ENDOSCOPY;  Service: Endoscopy;  Laterality: N/A;   Family History  Problem Relation Age of Onset  . Asthma Mother   . Heart failure Mother   . Cancer Mother   . Diabetes Mother   . Hypertension Mother   . Stroke Mother   . Heart failure Father   . Diabetes Father   . Colon cancer Neg Hx   . Pancreatic cancer Mother     deceased   History  Substance Use Topics  . Smoking status: Current Every Day Smoker -- 0.50 packs/day for 20 years    Types: Cigarettes  . Smokeless tobacco: Never Used  . Alcohol Use: No   OB History    Gravida Para Term Preterm AB TAB SAB Ectopic Multiple Living   4 3 3  1  1   3      Review of Systems  Constitutional: Negative for fever, chills and appetite change.  HENT: Positive for dental problem. Negative for congestion, facial swelling, sore throat and trouble swallowing.   Eyes: Negative for pain and visual disturbance.  Musculoskeletal: Negative for neck pain and neck stiffness.  Neurological: Negative for dizziness, facial asymmetry and headaches.  Hematological: Negative for adenopathy.  All other systems reviewed and are negative.     Allergies  Ibuprofen  Home Medications   Prior to Admission medications   Medication Sig Start Date End Date  Taking? Authorizing Provider  acetaminophen (TYLENOL) 500 MG tablet Take 1,000 mg by mouth every 6 (six) hours as needed for pain.   Yes Historical Provider, MD  calcium carbonate (TUMS - DOSED IN MG ELEMENTAL CALCIUM) 500 MG chewable tablet Chew 1 tablet by mouth daily as needed for indigestion or heartburn.   Yes Historical Provider, MD  omeprazole (PRILOSEC) 20 MG capsule Take 1 capsule (20 mg total) by mouth daily. Patient not taking: Reported on 11/17/2014 03/07/14   Ezequiel Essex, MD  ondansetron (ZOFRAN) 4 MG tablet Take 1 tablet (4 mg total) by mouth every 6 (six) hours. Patient not taking: Reported on 11/17/2014 03/07/14   Ezequiel Essex, MD    BP 142/79 mmHg  Pulse 77  Temp(Src) 98.8 F (37.1 C) (Oral)  Resp 18  Ht 5\' 7"  (1.702 m)  Wt 170 lb (77.111 kg)  BMI 26.62 kg/m2  SpO2 99%  LMP 02/09/2012 Physical Exam  Constitutional: She is oriented to person, place, and time. She appears well-developed and well-nourished. No distress.  HENT:  Head: Normocephalic and atraumatic.  Right Ear: Tympanic membrane and ear canal normal.  Left Ear: Tympanic membrane and ear canal normal.  Mouth/Throat: Uvula is midline, oropharynx is clear and moist and mucous membranes are normal. No trismus in the jaw. Dental caries present. No dental abscesses or uvula swelling. No oropharyngeal exudate.  Widespread dental decay and periodontal disease. Tenderness to the left upper central incisor and right upper canine. No obvious dental abscess.  Eyes: Pupils are equal, round, and reactive to light.  Neck: Normal range of motion. Neck supple.  Cardiovascular: Normal rate, regular rhythm, normal heart sounds and intact distal pulses.   No murmur heard. Pulmonary/Chest: Effort normal and breath sounds normal. No respiratory distress.  Musculoskeletal: Normal range of motion.  Lymphadenopathy:    She has no cervical adenopathy.  Neurological: She is alert and oriented to person, place, and time.  Skin: Skin is warm and dry. No rash noted.  Psychiatric: She has a normal mood and affect. Her behavior is normal.  Nursing note and vitals reviewed.   ED Course  Procedures  DIAGNOSTIC STUDIES: Oxygen Saturation is 99% on room air, normal by my interpretation.    COORDINATION OF CARE: 2:39 PM-Discussed treatment plan which includes medications with pt at bedside and pt agreed to plan.    Labs Review Labs Reviewed - No data to display  Imaging Review No results found.   EKG Interpretation None      MDM   Final diagnoses:  Pain, dental    Pt is well appearing, no concerning sx's for infection to floor of the mouth or deep structures  of the neck.  Rx for amoxil and vicodin  I personally performed the services described in this documentation, which was scribed in my presence. The recorded information has been reviewed and is accurate.    Dannel Rafter L. Vanessa St. Martin, PA-C 11/19/14 1332  Nat Christen, MD 11/22/14 1059

## 2014-11-17 NOTE — ED Notes (Signed)
Toothache x 4 days.

## 2015-02-05 ENCOUNTER — Inpatient Hospital Stay (HOSPITAL_COMMUNITY)
Admission: EM | Admit: 2015-02-05 | Discharge: 2015-02-08 | DRG: 440 | Disposition: A | Discharge status: Home / Self Care | Payer: Self-pay | Attending: Internal Medicine | Admitting: Internal Medicine

## 2015-02-05 ENCOUNTER — Emergency Department (HOSPITAL_COMMUNITY): Payer: Self-pay

## 2015-02-05 ENCOUNTER — Encounter (HOSPITAL_COMMUNITY): Payer: Self-pay | Admitting: Emergency Medicine

## 2015-02-05 DIAGNOSIS — Z823 Family history of stroke: Secondary | ICD-10-CM

## 2015-02-05 DIAGNOSIS — G8929 Other chronic pain: Secondary | ICD-10-CM | POA: Insufficient documentation

## 2015-02-05 DIAGNOSIS — K219 Gastro-esophageal reflux disease without esophagitis: Secondary | ICD-10-CM | POA: Diagnosis present

## 2015-02-05 DIAGNOSIS — I2699 Other pulmonary embolism without acute cor pulmonale: Secondary | ICD-10-CM

## 2015-02-05 DIAGNOSIS — Z9049 Acquired absence of other specified parts of digestive tract: Secondary | ICD-10-CM | POA: Diagnosis present

## 2015-02-05 DIAGNOSIS — Z833 Family history of diabetes mellitus: Secondary | ICD-10-CM

## 2015-02-05 DIAGNOSIS — R7989 Other specified abnormal findings of blood chemistry: Secondary | ICD-10-CM | POA: Insufficient documentation

## 2015-02-05 DIAGNOSIS — F17210 Nicotine dependence, cigarettes, uncomplicated: Secondary | ICD-10-CM | POA: Diagnosis present

## 2015-02-05 DIAGNOSIS — K859 Acute pancreatitis without necrosis or infection, unspecified: Secondary | ICD-10-CM | POA: Diagnosis present

## 2015-02-05 DIAGNOSIS — R109 Unspecified abdominal pain: Secondary | ICD-10-CM

## 2015-02-05 DIAGNOSIS — R945 Abnormal results of liver function studies: Secondary | ICD-10-CM

## 2015-02-05 DIAGNOSIS — Z8249 Family history of ischemic heart disease and other diseases of the circulatory system: Secondary | ICD-10-CM

## 2015-02-05 DIAGNOSIS — Z800 Family history of malignant neoplasm of digestive organs: Secondary | ICD-10-CM

## 2015-02-05 DIAGNOSIS — Z825 Family history of asthma and other chronic lower respiratory diseases: Secondary | ICD-10-CM

## 2015-02-05 DIAGNOSIS — J449 Chronic obstructive pulmonary disease, unspecified: Secondary | ICD-10-CM | POA: Diagnosis present

## 2015-02-05 LAB — TROPONIN I: Troponin I: <0.03 ng/mL (ref <0.031)

## 2015-02-05 LAB — CBC WITH DIFFERENTIAL/PLATELET
BASOS ABS: 0 10*3/uL (ref 0.0–0.1)
BASOS PCT: 0 % (ref 0–1)
Eosinophils Absolute: 0.1 10*3/uL (ref 0.0–0.7)
Eosinophils Relative: 1 % (ref 0–5)
HCT: 43.3 % (ref 36.0–46.0)
Hemoglobin: 14.6 g/dL (ref 12.0–15.0)
Lymphocytes Relative: 27 % (ref 12–46)
Lymphs Abs: 3.9 10*3/uL (ref 0.7–4.0)
MCH: 29.4 pg (ref 26.0–34.0)
MCHC: 33.7 g/dL (ref 30.0–36.0)
MCV: 87.1 fL (ref 78.0–100.0)
Monocytes Absolute: 0.9 10*3/uL (ref 0.1–1.0)
Monocytes Relative: 6 % (ref 3–12)
NEUTROS PCT: 66 % (ref 43–77)
Neutro Abs: 9.6 10*3/uL — ABNORMAL HIGH (ref 1.7–7.7)
Platelets: 538 10*3/uL — ABNORMAL HIGH (ref 150–400)
RBC: 4.97 MIL/uL (ref 3.87–5.11)
RDW: 13.9 % (ref 11.5–15.5)
WBC: 14.5 10*3/uL — ABNORMAL HIGH (ref 4.0–10.5)

## 2015-02-05 LAB — COMPREHENSIVE METABOLIC PANEL
ALK PHOS: 70 U/L (ref 39–117)
ALT: 18 U/L (ref 0–35)
AST: 16 U/L (ref 0–37)
Albumin: 4.4 g/dL (ref 3.5–5.2)
Anion gap: 11 (ref 5–15)
BILIRUBIN TOTAL: 0.5 mg/dL (ref 0.3–1.2)
BUN: 15 mg/dL (ref 6–23)
CHLORIDE: 106 mmol/L (ref 96–112)
CO2: 20 mmol/L (ref 19–32)
Calcium: 9.4 mg/dL (ref 8.4–10.5)
Creatinine, Ser: 0.8 mg/dL (ref 0.50–1.10)
GFR calc Af Amer: >90 mL/min (ref >90)
GFR calc non Af Amer: 85 mL/min — ABNORMAL LOW (ref >90)
Glucose, Bld: 119 mg/dL — ABNORMAL HIGH (ref 70–99)
Potassium: 3.5 mmol/L (ref 3.5–5.1)
SODIUM: 137 mmol/L (ref 135–145)
TOTAL PROTEIN: 8.2 g/dL (ref 6.0–8.3)

## 2015-02-05 LAB — LIPASE, BLOOD: Lipase: 207 U/L — ABNORMAL HIGH (ref 11–59)

## 2015-02-05 MED ORDER — HYDROMORPHONE HCL 1 MG/ML IJ SOLN
1.0000 mg | Freq: Once | INTRAMUSCULAR | Status: AC
  Administered 2015-02-05: 1 mg via INTRAVENOUS
  Filled 2015-02-05: qty 1

## 2015-02-05 MED ORDER — ONDANSETRON HCL 4 MG/2ML IJ SOLN
4.0000 mg | Freq: Once | INTRAMUSCULAR | Status: AC
  Administered 2015-02-05: 4 mg via INTRAVENOUS
  Filled 2015-02-05: qty 2

## 2015-02-05 MED ORDER — MORPHINE SULFATE 4 MG/ML IJ SOLN
4.0000 mg | Freq: Once | INTRAMUSCULAR | Status: AC
  Administered 2015-02-05: 4 mg via INTRAVENOUS
  Filled 2015-02-05: qty 1

## 2015-02-05 NOTE — ED Notes (Signed)
Patient reports pain to right side of upper back/ribs x 3 weeks. States pain started radiating into chest x 1 week, worsened last night. Reports nausea, shortness of breath, radiation of pain into right jaw. States pain worsens with deep breathing.

## 2015-02-05 NOTE — ED Notes (Signed)
Patient ambulatory to bathroom to have BM.  Patient informed that we will perform an in and out cath when she gets back to her room.  Patient medicated again for continued pain.

## 2015-02-05 NOTE — ED Notes (Signed)
I&O cath completed; urine sent to lab.

## 2015-02-05 NOTE — ED Provider Notes (Signed)
CSN: [id number]     Arrival date & time 02/05/15  2035 History   First MD Initiated Contact with Patient 02/05/15 2054     Chief Complaint  Patient presents with  . Chest Pain     (Consider location/radiation/quality/duration/timing/severity/associated sxs/prior Treatment) The history is provided by the patient.   Rita [name] is a 49 y.o. female with a history of pancreatitis, PUD and chronic abdominal pain presenting with a a 3 week history of slowly worsening right lateral back pain with radiation into her right upper quadrant, right chest with stabs of pain into her right jaw over the past week.  Her symptoms worsened last night.  She states her pain is similar to pancreatitis pain but not in the right location, as her pancreatitis pain is generally centered in her mid back.  Her pain is worsened with deep inspiration, movement and palpation.  She endorses nausea without emesis and has developed brown watery stool shortly before arrival here.  She has taken no medications and has found no alleviators.  She endorses anorexia and little fluid intake in the past 3 days. She denies history of etoh abuse.  She has had a cholecystectomy.    Past Medical History  Diagnosis Date  . Pancreatitis   . Peptic ulcer   . GERD (gastroesophageal reflux disease)   . Hiatal hernia   . Chronic back pain   . Chronic abdominal pain   . Barrett's esophagus with esophagitis 03/26/2013  . Tobacco use 03/26/2013   Past Surgical History  Procedure Laterality Date  . Cholecystectomy    . Cesarean section    . Tubal ligation    . Esophagogastroduodenoscopy  Oct 2011    Dr. Laural Golden: ulcer in distal esophagus, soft stricture at GE junction s/p balloon dilation PATH: BARRETT'S  . Esophagogastroduodenoscopy (egd) with esophageal dilation N/A 03/27/2013    Procedure: ESOPHAGOGASTRODUODENOSCOPY (EGD) WITH ESOPHAGEAL DILATION;  Surgeon: Daneil Dolin, MD;  Location: [facility];  Service: Endoscopy;  Laterality:  N/A;  possible dilation  . Eus N/A 05/09/2013    Procedure: UPPER ENDOSCOPIC ULTRASOUND (EUS) LINEAR;  Surgeon: Milus Banister, MD;  Location: [facility];  Service: Endoscopy;  Laterality: N/A;   Family History  Problem Relation Age of Onset  . Asthma Mother   . Heart failure Mother   . Cancer Mother   . Diabetes Mother   . Hypertension Mother   . Stroke Mother   . Heart failure Father   . Diabetes Father   . Colon cancer Neg Hx   . Pancreatic cancer Mother     deceased   History  Substance Use Topics  . Smoking status: Current Every Day Smoker -- 0.50 packs/day for 20 years    Types: Cigarettes  . Smokeless tobacco: Never Used  . Alcohol Use: No   OB History    Gravida Para Term Preterm AB TAB SAB Ectopic Multiple Living   4 3 3  1  1   3      Review of Systems  Constitutional: Negative for fever and chills.  HENT: Negative for congestion and sore throat.   Eyes: Negative.   Respiratory: Negative for chest tightness and shortness of breath.   Cardiovascular: Positive for chest pain. Negative for palpitations and leg swelling.  Gastrointestinal: Positive for nausea, abdominal pain and diarrhea. Negative for vomiting.  Genitourinary: Negative.   Musculoskeletal: Negative for joint swelling, arthralgias and neck pain.  Skin: Negative.  Negative for rash and wound.  Neurological:  Negative for dizziness, weakness, light-headedness, numbness and headaches.  Psychiatric/Behavioral: Negative.       Allergies  Ibuprofen  Home Medications   Prior to Admission medications   Medication Sig Start Date End Date Taking? Authorizing Provider  acetaminophen (TYLENOL) 500 MG tablet Take 1,000 mg by mouth every 6 (six) hours as needed for pain.   Yes Historical Provider, MD  calcium carbonate (TUMS - DOSED IN MG ELEMENTAL CALCIUM) 500 MG chewable tablet Chew 1 tablet by mouth daily as needed for indigestion or heartburn.   Yes Historical Provider, MD  amoxicillin (AMOXIL) 500 MG  capsule Take 1 capsule (500 mg total) by mouth 3 (three) times daily. Patient not taking: Reported on 02/05/2015 11/17/14   Tammy L. Triplett, PA-C  HYDROcodone-acetaminophen (NORCO/VICODIN) 5-325 MG per tablet Take one-two tabs po q 4-6 hrs prn pain Patient not taking: Reported on 02/05/2015 11/17/14   Tammy L. Triplett, PA-C  omeprazole (PRILOSEC) 20 MG capsule Take 1 capsule (20 mg total) by mouth daily. Patient not taking: Reported on 11/17/2014 03/07/14   Ezequiel Essex, MD  ondansetron (ZOFRAN) 4 MG tablet Take 1 tablet (4 mg total) by mouth every 6 (six) hours. Patient not taking: Reported on 11/17/2014 03/07/14   Ezequiel Essex, MD   BP 130/70 mmHg  Pulse 87  Temp(Src) 97.9 F (36.6 C) (Oral)  Resp 18  Ht 5\' 7"  (1.702 m)  Wt 187 lb 3.2 oz (84.913 kg)  BMI 29.31 kg/m2  SpO2 100%  LMP 02/09/2012 Physical Exam  Constitutional: She appears well-developed and well-nourished.  HENT:  Head: Normocephalic and atraumatic.  Eyes: Conjunctivae are normal.  Neck: Normal range of motion.  Cardiovascular: Normal rate, regular rhythm, normal heart sounds and intact distal pulses.   Pulmonary/Chest: Effort normal and breath sounds normal. She has no wheezes. She exhibits tenderness.    Abdominal: Soft. Bowel sounds are normal. She exhibits distension. She exhibits no mass. There is tenderness in the right upper quadrant and epigastric area. There is no rebound and no guarding.  Musculoskeletal: Normal range of motion.  Neurological: She is alert.  Skin: Skin is warm and dry.  Psychiatric: She has a normal mood and affect.  Nursing note and vitals reviewed.   ED Course  Procedures (including critical care time) Labs Review Labs Reviewed  CBC WITH DIFFERENTIAL/PLATELET - Abnormal; Notable for the following:    WBC 14.5 (*)    Platelets 538 (*)    Neutro Abs 9.6 (*)    All other components within normal limits  COMPREHENSIVE METABOLIC PANEL - Abnormal; Notable for the following:     Glucose, Bld 119 (*)    GFR calc non Af Amer 85 (*)    All other components within normal limits  LIPASE, BLOOD - Abnormal; Notable for the following:    Lipase 207 (*)    All other components within normal limits  URINALYSIS, ROUTINE W REFLEX MICROSCOPIC - Abnormal; Notable for the following:    Bilirubin Urine SMALL (*)    All other components within normal limits  PREGNANCY, URINE - Abnormal; Notable for the following:    Preg Test, Ur POSITIVE (*)    All other components within normal limits  TROPONIN I  HCG, QUANTITATIVE, PREGNANCY    Imaging Review Dg Chest 2 View  02/05/2015   CLINICAL DATA:  Acute onset of right upper back pain, radiating to the chest, with nausea and shortness of breath. Initial encounter.  EXAM: CHEST  2 VIEW  COMPARISON:  Chest radiograph  performed 03/07/2014  FINDINGS: The lungs are well-aerated and clear. There is no evidence of focal opacification, pleural effusion or pneumothorax.  The heart is normal in size; the mediastinal contour is within normal limits. No acute osseous abnormalities are seen.  IMPRESSION: No acute cardiopulmonary process seen. No displaced rib fractures identified.   Electronically Signed   By: Garald Balding M.D.   On: 02/05/2015 21:47   Dg Abd 2 Views  02/05/2015   CLINICAL DATA:  Acute onset of right upper back pain radiating to the chest. Diarrhea for 2 days. Initial encounter.  EXAM: ABDOMEN - 2 VIEW  COMPARISON:  Abdominal radiograph, and CT of the abdomen and pelvis, performed 03/07/2014  FINDINGS: The visualized bowel gas pattern is unremarkable. Scattered air and stool filled loops of colon are seen; no abnormal dilatation of small bowel loops is seen to suggest small bowel obstruction. No free intra-abdominal air is identified on the provided upright view. Clips are noted within the right upper quadrant, reflecting prior cholecystectomy.  The visualized osseous structures are within normal limits; the sacroiliac joints are  unremarkable in appearance. The visualized lung bases are essentially clear.  IMPRESSION: Unremarkable bowel gas pattern; no free intra-abdominal air seen. No significant stool seen within the colon.   Electronically Signed   By: Garald Balding M.D.   On: 02/05/2015 21:48     EKG Interpretation   Date/Time:  Thursday February 05 2015 21:04:55 EST Ventricular Rate:  87 PR Interval:  164 QRS Duration: 92 QT Interval:  384 QTC Calculation: 462 R Axis:   64 Text Interpretation:  Sinus rhythm Confirmed by Alvino Chapel  MD, Ovid Curd  [phone]) on 02/05/2015 9:13:32 PM      MDM   Final diagnoses:  Abdominal pain  Acute pancreatitis, unspecified pancreatitis type    Patients labs and/or radiological studies were viewed and considered during the medical decision making and disposition process. Urine preg positive.  Lab states faintly positive - suspect false positive.  Confirmed with pt LMP in 2013 - should be post menopausal. Quantitative negative - Rapid pregnancy test falsely positive.  Other labs c/w acute pancreatitis. She continues to have pain despite morphine and dilaudid dosing.  Will admit for pain control, support and hydration.    Discussed with Dr. Alvino Chapel who agrees with plan.    Evalee Jefferson, PA-C 02/06/15 [place] Alvino Chapel, MD 02/07/15 [phone]

## 2015-02-05 NOTE — ED Notes (Signed)
Patient ambulatory to bathroom; states having diarrhea.

## 2015-02-06 ENCOUNTER — Inpatient Hospital Stay (HOSPITAL_COMMUNITY): Payer: Self-pay

## 2015-02-06 DIAGNOSIS — R7989 Other specified abnormal findings of blood chemistry: Secondary | ICD-10-CM

## 2015-02-06 DIAGNOSIS — K859 Acute pancreatitis without necrosis or infection, unspecified: Secondary | ICD-10-CM | POA: Diagnosis present

## 2015-02-06 DIAGNOSIS — R945 Abnormal results of liver function studies: Secondary | ICD-10-CM

## 2015-02-06 DIAGNOSIS — K861 Other chronic pancreatitis: Secondary | ICD-10-CM

## 2015-02-06 DIAGNOSIS — K219 Gastro-esophageal reflux disease without esophagitis: Secondary | ICD-10-CM

## 2015-02-06 LAB — BRAIN NATRIURETIC PEPTIDE: B Natriuretic Peptide: 15 pg/mL (ref 0.0–100.0)

## 2015-02-06 LAB — CBC
HEMATOCRIT: 38.8 % (ref 36.0–46.0)
HEMOGLOBIN: 12.8 g/dL (ref 12.0–15.0)
MCH: 29.4 pg (ref 26.0–34.0)
MCHC: 33 g/dL (ref 30.0–36.0)
MCV: 89 fL (ref 78.0–100.0)
Platelets: 447 10*3/uL — ABNORMAL HIGH (ref 150–400)
RBC: 4.36 MIL/uL (ref 3.87–5.11)
RDW: 13.9 % (ref 11.5–15.5)
WBC: 10.5 10*3/uL (ref 4.0–10.5)

## 2015-02-06 LAB — URINALYSIS, ROUTINE W REFLEX MICROSCOPIC
Glucose, UA: NEGATIVE mg/dL
Hgb urine dipstick: NEGATIVE
KETONES UR: NEGATIVE mg/dL
LEUKOCYTES UA: NEGATIVE
Nitrite: NEGATIVE
PROTEIN: NEGATIVE mg/dL
SPECIFIC GRAVITY, URINE: 1.025 (ref 1.005–1.030)
Urobilinogen, UA: 0.2 mg/dL (ref 0.0–1.0)
pH: 6 (ref 5.0–8.0)

## 2015-02-06 LAB — LIPASE, BLOOD: Lipase: 184 U/L — ABNORMAL HIGH (ref 11–59)

## 2015-02-06 LAB — COMPREHENSIVE METABOLIC PANEL
ALBUMIN: 3.6 g/dL (ref 3.5–5.2)
ALK PHOS: 81 U/L (ref 39–117)
ALT: 57 U/L — AB (ref 0–35)
AST: 84 U/L — AB (ref 0–37)
Anion gap: 9 (ref 5–15)
BUN: 17 mg/dL (ref 6–23)
CO2: 22 mmol/L (ref 19–32)
Calcium: 8.4 mg/dL (ref 8.4–10.5)
Chloride: 108 mmol/L (ref 96–112)
Creatinine, Ser: 0.79 mg/dL (ref 0.50–1.10)
GFR calc Af Amer: >90 mL/min (ref >90)
GFR calc non Af Amer: >90 mL/min (ref >90)
Glucose, Bld: 96 mg/dL (ref 70–99)
Potassium: 3.1 mmol/L — ABNORMAL LOW (ref 3.5–5.1)
SODIUM: 139 mmol/L (ref 135–145)
TOTAL PROTEIN: 6.7 g/dL (ref 6.0–8.3)
Total Bilirubin: 0.5 mg/dL (ref 0.3–1.2)

## 2015-02-06 LAB — HCG, QUANTITATIVE, PREGNANCY: HCG, BETA CHAIN, QUANT, S: 3 m[IU]/mL (ref <5)

## 2015-02-06 LAB — MAGNESIUM: Magnesium: 1.9 mg/dL (ref 1.5–2.5)

## 2015-02-06 LAB — TROPONIN I
Troponin I: <0.03 ng/mL (ref <0.031)
Troponin I: <0.03 ng/mL (ref <0.031)
Troponin I: <0.03 ng/mL (ref <0.031)

## 2015-02-06 LAB — CLOSTRIDIUM DIFFICILE BY PCR: Toxigenic C. Difficile by PCR: NEGATIVE

## 2015-02-06 LAB — PREGNANCY, URINE: PREG TEST UR: POSITIVE — AB

## 2015-02-06 MED ORDER — SODIUM CHLORIDE 0.9 % IV SOLN: INTRAVENOUS | Status: AC

## 2015-02-06 MED ORDER — FAMOTIDINE IN NACL 20-0.9 MG/50ML-% IV SOLN
20.0000 mg | Freq: Two times a day (BID) | INTRAVENOUS | Status: DC
  Administered 2015-02-06 – 2015-02-08 (×5): 20 mg via INTRAVENOUS
  Filled 2015-02-06 (×7): qty 50

## 2015-02-06 MED ORDER — PANTOPRAZOLE SODIUM 40 MG IV SOLR
40.0000 mg | Freq: Two times a day (BID) | INTRAVENOUS | Status: DC
  Administered 2015-02-06 – 2015-02-08 (×5): 40 mg via INTRAVENOUS
  Filled 2015-02-06 (×5): qty 40

## 2015-02-06 MED ORDER — POTASSIUM CHLORIDE CRYS ER 20 MEQ PO TBCR
40.0000 meq | EXTENDED_RELEASE_TABLET | ORAL | Status: AC
  Administered 2015-02-06 (×2): 40 meq via ORAL
  Filled 2015-02-06 (×2): qty 2

## 2015-02-06 MED ORDER — ONDANSETRON HCL 4 MG PO TABS
4.0000 mg | ORAL_TABLET | Freq: Four times a day (QID) | ORAL | Status: DC | PRN
  Administered 2015-02-08: 4 mg via ORAL
  Filled 2015-02-06: qty 1

## 2015-02-06 MED ORDER — ENOXAPARIN SODIUM 40 MG/0.4ML ~~LOC~~ SOLN
40.0000 mg | SUBCUTANEOUS | Status: DC
  Administered 2015-02-06 – 2015-02-08 (×3): 40 mg via SUBCUTANEOUS
  Filled 2015-02-06 (×3): qty 0.4

## 2015-02-06 MED ORDER — ACETAMINOPHEN 325 MG PO TABS: 650.0000 mg | ORAL_TABLET | Freq: Four times a day (QID) | ORAL | Status: DC | PRN

## 2015-02-06 MED ORDER — SODIUM CHLORIDE 0.9 % IV BOLUS (SEPSIS)
1000.0000 mL | Freq: Once | INTRAVENOUS | Status: AC
  Administered 2015-02-06: 1000 mL via INTRAVENOUS

## 2015-02-06 MED ORDER — SODIUM CHLORIDE 0.9 % IV SOLN
INTRAVENOUS | Status: DC
  Administered 2015-02-06 – 2015-02-07 (×2): via INTRAVENOUS

## 2015-02-06 MED ORDER — IPRATROPIUM-ALBUTEROL 0.5-2.5 (3) MG/3ML IN SOLN
3.0000 mL | RESPIRATORY_TRACT | Status: DC
  Administered 2015-02-06 (×3): 3 mL via RESPIRATORY_TRACT
  Filled 2015-02-06 (×3): qty 3

## 2015-02-06 MED ORDER — ONDANSETRON HCL 4 MG/2ML IJ SOLN: 4.0000 mg | Freq: Three times a day (TID) | INTRAMUSCULAR | Status: AC | PRN

## 2015-02-06 MED ORDER — HYDROMORPHONE HCL 1 MG/ML IJ SOLN
1.0000 mg | INTRAMUSCULAR | Status: AC | PRN
  Administered 2015-02-06 (×2): 1 mg via INTRAVENOUS
  Filled 2015-02-06 (×3): qty 1

## 2015-02-06 MED ORDER — HYDROMORPHONE HCL 1 MG/ML IJ SOLN
1.0000 mg | INTRAMUSCULAR | Status: DC | PRN
  Administered 2015-02-06 – 2015-02-07 (×7): 1 mg via INTRAVENOUS
  Filled 2015-02-06 (×6): qty 1

## 2015-02-06 MED ORDER — IOHEXOL 350 MG/ML SOLN
100.0000 mL | Freq: Once | INTRAVENOUS | Status: AC | PRN
  Administered 2015-02-06: 100 mL via INTRAVENOUS

## 2015-02-06 MED ORDER — ONDANSETRON HCL 4 MG/2ML IJ SOLN: 4.0000 mg | Freq: Four times a day (QID) | INTRAMUSCULAR | Status: DC | PRN

## 2015-02-06 MED ORDER — GI COCKTAIL ~~LOC~~
30.0000 mL | Freq: Once | ORAL | Status: AC
  Administered 2015-02-06: 30 mL via ORAL
  Filled 2015-02-06: qty 30

## 2015-02-06 NOTE — Progress Notes (Signed)
Patient was admitted to the hospital earlier this morning by Dr. Darrick Meigs  Patient seen and examined.  She has been admitted to the hospital with abdominal pain, nausea and diarrhea. Her lipase is mildly elevated as are her AST/ALT. She is s/p cholecystectomy. Will continue with bowel rest. Check abdominal ultrasound.  She complains of progressive shortness of breath. CT angio negative for PE. She is a chronic smoker and may have underlying COPD. Will start on neb treatments  C diff has been ordered for diarrhea.  [name],[name]

## 2015-02-06 NOTE — ED Notes (Signed)
Patient medicated per order and NS 1L bolus infusing without difficulty.

## 2015-02-06 NOTE — H&P (Addendum)
PCP:   [facility]   Chief Complaint:  Chest pain  HPI: 49 year old female who   has a past medical history of Pancreatitis; Peptic ulcer; GERD (gastroesophageal reflux disease); Hiatal hernia; Chronic back pain; Chronic abdominal pain; Barrett's esophagus with esophagitis (03/26/2013); and Tobacco use (03/26/2013). Today presents to the ED with chief complaint of right-sided pleuritic chest pain, which radiates to upper epigastric region, right arm and right side of the jaw. The pain has been going on for past 2 days and yesterday patient also had an episode where she was about to pass out, though she denies syncope. Patient says that she came to the hospital today for further evaluation. She also complains of intermittent diarrhea. No blood or black colored stool noted in the stool. In the ED patient was found to have elevated lipase 207. She also complains of shortness of breath on exertion. Her O2 sats are 99% on room air.  Allergies:   Allergies  Allergen Reactions  . Ibuprofen Other (See Comments)    ulcers      Past Medical History  Diagnosis Date  . Pancreatitis   . Peptic ulcer   . GERD (gastroesophageal reflux disease)   . Hiatal hernia   . Chronic back pain   . Chronic abdominal pain   . Barrett's esophagus with esophagitis 03/26/2013  . Tobacco use 03/26/2013    Past Surgical History  Procedure Laterality Date  . Cholecystectomy    . Cesarean section    . Tubal ligation    . Esophagogastroduodenoscopy  Oct 2011    Dr. Laural Golden: ulcer in distal esophagus, soft stricture at GE junction s/p balloon dilation PATH: BARRETT'S  . Esophagogastroduodenoscopy (egd) with esophageal dilation N/A 03/27/2013    Procedure: ESOPHAGOGASTRODUODENOSCOPY (EGD) WITH ESOPHAGEAL DILATION;  Surgeon: Daneil Dolin, MD;  Location: [facility];  Service: Endoscopy;  Laterality: N/A;  possible dilation  . Eus N/A 05/09/2013    Procedure: UPPER ENDOSCOPIC ULTRASOUND (EUS)  LINEAR;  Surgeon: Milus Banister, MD;  Location: [facility];  Service: Endoscopy;  Laterality: N/A;    Prior to Admission medications   Medication Sig Start Date End Date Taking? Authorizing Provider  acetaminophen (TYLENOL) 500 MG tablet Take 1,000 mg by mouth every 6 (six) hours as needed for pain.   Yes Historical Provider, MD  calcium carbonate (TUMS - DOSED IN MG ELEMENTAL CALCIUM) 500 MG chewable tablet Chew 1 tablet by mouth daily as needed for indigestion or heartburn.   Yes Historical Provider, MD  amoxicillin (AMOXIL) 500 MG capsule Take 1 capsule (500 mg total) by mouth 3 (three) times daily. Patient not taking: Reported on 02/05/2015 11/17/14   Tammy L. Triplett, PA-C  HYDROcodone-acetaminophen (NORCO/VICODIN) 5-325 MG per tablet Take one-two tabs po q 4-6 hrs prn pain Patient not taking: Reported on 02/05/2015 11/17/14   Tammy L. Triplett, PA-C  omeprazole (PRILOSEC) 20 MG capsule Take 1 capsule (20 mg total) by mouth daily. Patient not taking: Reported on 11/17/2014 03/07/14   Ezequiel Essex, MD  ondansetron (ZOFRAN) 4 MG tablet Take 1 tablet (4 mg total) by mouth every 6 (six) hours. Patient not taking: Reported on 11/17/2014 03/07/14   Ezequiel Essex, MD    Social History:  reports that she has been smoking Cigarettes.  She has a 10 pack-year smoking history. She has never used smokeless tobacco. She reports that she does not drink alcohol or use illicit drugs.  Family History  Problem Relation Age of Onset  .  Asthma Mother   . Heart failure Mother   . Cancer Mother   . Diabetes Mother   . Hypertension Mother   . Stroke Mother   . Heart failure Father   . Diabetes Father   . Colon cancer Neg Hx   . Pancreatic cancer Mother     deceased     All the positives are listed in BOLD  Review of Systems:  HEENT: Headache, blurred vision, runny nose, sore throat Neck: Hypothyroidism, hyperthyroidism,,lymphadenopathy Chest : Shortness of breath, history of COPD,  Asthma Heart : Chest pain, history of coronary arterey disease GI:  Nausea, vomiting, diarrhea, constipation, GERD GU: Dysuria, urgency, frequency of urination, hematuria Neuro: Stroke, seizures, syncope Psych: Depression, anxiety, hallucinations   Physical Exam: Blood pressure 101/66, pulse 72, temperature 97.9 F (36.6 C), temperature source Oral, resp. rate 18, height 5\' 7"  (1.702 m), weight 84.913 kg (187 lb 3.2 oz), last menstrual period 02/09/2012, SpO2 99 %. Constitutional:   Patient is a well-developed and well-nourished *female in no acute distress and cooperative with exam. Head: Normocephalic and atraumatic Mouth: Mucus membranes moist Eyes: PERRL, EOMI, conjunctivae normal Neck: Supple, No Thyromegaly Cardiovascular: RRR, S1 normal, S2 normal Pulmonary/Chest: CTAB, no wheezes, rales, or rhonchi Abdominal: Soft. Positive epigastric tenderness to palpation, non-distended, bowel sounds are normal, no masses, organomegaly, or guarding present.  Neurological: A&O x3, Strength is normal and symmetric bilaterally, cranial nerve II-XII are grossly intact, no focal motor deficit, sensory intact to light touch bilaterally.  Extremities : No Cyanosis, Clubbing or Edema  Labs on Admission:  Basic Metabolic Panel:  Recent Labs Lab 02/05/15 2047  NA 137  K 3.5  CL 106  CO2 20  GLUCOSE 119*  BUN 15  CREATININE 0.80  CALCIUM 9.4   Liver Function Tests:  Recent Labs Lab 02/05/15 2047  AST 16  ALT 18  ALKPHOS 70  BILITOT 0.5  PROT 8.2  ALBUMIN 4.4    Recent Labs Lab 02/05/15 2115  LIPASE 207*   No results for input(s): AMMONIA in the last 168 hours. CBC:  Recent Labs Lab 02/05/15 2047  WBC 14.5*  NEUTROABS 9.6*  HGB 14.6  HCT 43.3  MCV 87.1  PLT 538*   Cardiac Enzymes:  Recent Labs Lab 02/05/15 2047  TROPONINI <0.03     Radiological Exams on Admission: Dg Chest 2 View  02/05/2015   CLINICAL DATA:  Acute onset of right upper back pain, radiating  to the chest, with nausea and shortness of breath. Initial encounter.  EXAM: CHEST  2 VIEW  COMPARISON:  Chest radiograph performed 03/07/2014  FINDINGS: The lungs are well-aerated and clear. There is no evidence of focal opacification, pleural effusion or pneumothorax.  The heart is normal in size; the mediastinal contour is within normal limits. No acute osseous abnormalities are seen.  IMPRESSION: No acute cardiopulmonary process seen. No displaced rib fractures identified.   Electronically Signed   By: Garald Balding M.D.   On: 02/05/2015 21:47   Dg Abd 2 Views  02/05/2015   CLINICAL DATA:  Acute onset of right upper back pain radiating to the chest. Diarrhea for 2 days. Initial encounter.  EXAM: ABDOMEN - 2 VIEW  COMPARISON:  Abdominal radiograph, and CT of the abdomen and pelvis, performed 03/07/2014  FINDINGS: The visualized bowel gas pattern is unremarkable. Scattered air and stool filled loops of colon are seen; no abnormal dilatation of small bowel loops is seen to suggest small bowel obstruction. No free intra-abdominal air is identified  on the provided upright view. Clips are noted within the right upper quadrant, reflecting prior cholecystectomy.  The visualized osseous structures are within normal limits; the sacroiliac joints are unremarkable in appearance. The visualized lung bases are essentially clear.  IMPRESSION: Unremarkable bowel gas pattern; no free intra-abdominal air seen. No significant stool seen within the colon.   Electronically Signed   By: Garald Balding M.D.   On: 02/05/2015 21:48    EKG: Independently reviewed. Sinus rhythm   Assessment/Plan Principal Problem:   Pancreatitis Active Problems:   GERD (gastroesophageal reflux disease)  Chest pain Patient has right-sided pleuritic chest pain, also had episode of near syncope. Will check a CT angiogram to rule out pulmonary embolism. Also check cardiac enzymes troponin I every 6 hours 3. EKG shows normal sinus  rhythm.  Pancreatitis Patient has mild elevation of lipase. Denies alcohol abuse. Liver enzymes are within normal limits. Will keep her nothing by mouth start IV fluids and recheck lipase in a.m. Will continue with Dilaudid 1 mg every 4 hours when necessary for pain.  Diarrhea She also complains of intermittent diarrhea, will check stool for C. difficile PCR.  DVT prophylaxis Lovenox  Code status: Patient is full code  Family discussion: No family at bedside   Time Spent on Admission: 60 minutes  LAMA,GAGAN S [facility] Pager: [phone] 02/06/2015, 2:31 AM  If 7PM-7AM, please contact night-coverage  [email or web address]  Password [email or web address]

## 2015-02-06 NOTE — ED Notes (Signed)
Patient transported to Room 335 via wheelchair.

## 2015-02-06 NOTE — ED Notes (Signed)
Patient to be admitted.  Continues to c/o abdominal/rib/chest pain.

## 2015-02-07 DIAGNOSIS — J449 Chronic obstructive pulmonary disease, unspecified: Secondary | ICD-10-CM

## 2015-02-07 LAB — COMPREHENSIVE METABOLIC PANEL
ALK PHOS: 61 U/L (ref 39–117)
ALT: 27 U/L (ref 0–35)
ANION GAP: 7 (ref 5–15)
AST: 20 U/L (ref 0–37)
Albumin: 3.1 g/dL — ABNORMAL LOW (ref 3.5–5.2)
BILIRUBIN TOTAL: 0.3 mg/dL (ref 0.3–1.2)
BUN: 13 mg/dL (ref 6–23)
CO2: 21 mmol/L (ref 19–32)
CREATININE: 0.69 mg/dL (ref 0.50–1.10)
Calcium: 8.2 mg/dL — ABNORMAL LOW (ref 8.4–10.5)
Chloride: 115 mmol/L — ABNORMAL HIGH (ref 96–112)
GFR calc Af Amer: >90 mL/min (ref >90)
GLUCOSE: 128 mg/dL — AB (ref 70–99)
POTASSIUM: 3.6 mmol/L (ref 3.5–5.1)
SODIUM: 143 mmol/L (ref 135–145)
TOTAL PROTEIN: 5.7 g/dL — AB (ref 6.0–8.3)

## 2015-02-07 LAB — CBC
HCT: 33.6 % — ABNORMAL LOW (ref 36.0–46.0)
Hemoglobin: 10.9 g/dL — ABNORMAL LOW (ref 12.0–15.0)
MCH: 29.6 pg (ref 26.0–34.0)
MCHC: 32.4 g/dL (ref 30.0–36.0)
MCV: 91.3 fL (ref 78.0–100.0)
Platelets: 339 10*3/uL (ref 150–400)
RBC: 3.68 MIL/uL — ABNORMAL LOW (ref 3.87–5.11)
RDW: 14.1 % (ref 11.5–15.5)
WBC: 6.5 10*3/uL (ref 4.0–10.5)

## 2015-02-07 LAB — LIPASE, BLOOD: Lipase: 40 U/L (ref 11–59)

## 2015-02-07 MED ORDER — IPRATROPIUM-ALBUTEROL 0.5-2.5 (3) MG/3ML IN SOLN
3.0000 mL | Freq: Four times a day (QID) | RESPIRATORY_TRACT | Status: DC
  Administered 2015-02-07: 3 mL via RESPIRATORY_TRACT
  Filled 2015-02-07: qty 3

## 2015-02-07 MED ORDER — SIMETHICONE 80 MG PO CHEW: 160.0000 mg | CHEWABLE_TABLET | Freq: Four times a day (QID) | ORAL | Status: DC | PRN

## 2015-02-07 MED ORDER — OXYCODONE-ACETAMINOPHEN 5-325 MG PO TABS
1.0000 | ORAL_TABLET | ORAL | Status: DC | PRN
  Administered 2015-02-07: 2 via ORAL
  Administered 2015-02-07 – 2015-02-08 (×3): 1 via ORAL
  Administered 2015-02-08: 2 via ORAL
  Filled 2015-02-07: qty 1
  Filled 2015-02-07: qty 2
  Filled 2015-02-07: qty 1
  Filled 2015-02-07: qty 2
  Filled 2015-02-07: qty 1

## 2015-02-07 MED ORDER — SODIUM CHLORIDE 0.45 % IV SOLN
INTRAVENOUS | Status: DC
  Administered 2015-02-07 – 2015-02-08 (×2): via INTRAVENOUS

## 2015-02-07 MED ORDER — ALBUTEROL SULFATE (2.5 MG/3ML) 0.083% IN NEBU: 2.5000 mg | INHALATION_SOLUTION | RESPIRATORY_TRACT | Status: DC | PRN

## 2015-02-07 MED ORDER — CYCLOBENZAPRINE HCL 10 MG PO TABS: 5.0000 mg | ORAL_TABLET | Freq: Three times a day (TID) | ORAL | Status: DC | PRN

## 2015-02-07 MED ORDER — IPRATROPIUM-ALBUTEROL 0.5-2.5 (3) MG/3ML IN SOLN
3.0000 mL | Freq: Three times a day (TID) | RESPIRATORY_TRACT | Status: DC | PRN
  Administered 2015-02-08: 3 mL via RESPIRATORY_TRACT
  Filled 2015-02-07: qty 3

## 2015-02-07 NOTE — Progress Notes (Signed)
Patient left floor this afternoon, stated "the doctor said I could eat and drink so I went to get Dr Rita [name]". Discussed with patient she should not leave unit for safety reasons due to having IV site and receiving pain medication. Stated she understood. Donavan Foil, RN

## 2015-02-07 NOTE — Progress Notes (Signed)
Patient neb txs decreased to q6, lungs decreased , xray clear, no Pulmonary emboli , no wheezes, Potassium low. Patient smokes, has hx of Asthma??

## 2015-02-07 NOTE — Progress Notes (Signed)
[facility] PROGRESS NOTE  LEOTHA WESTERMEYER WUX:[id number] DOB: Jun 13, 1966 DOA: 02/05/2015 PCP: [facility]  Assessment/Plan: 1. Acute pancreatitis. Patient denies any history of alcohol abuse. She is already status post cholecystectomy. She reports having episodes of pancreatitis in the past. She was initially made nothing by mouth for bowel rest and treated supportively with IV fluids and pain medications. She reports that her abdominal pain has improved and her lipase has trended back to normal range. We'll start the patient on clear liquids today. 2. Chest pain. Patient is ruled out for ACS with negative cardiac markers. ECG is nonacute. 3. Diarrhea. Possibly viral in origin. C. difficile is negative. Symptoms have resolved. 4. COPD. Patient is described progressive worsening shortness of breath and dyspnea on exertion. She has a long history of tobacco use. She was started on nebulizer treatments with improvement of her overall breathing. CT angio of the chest was negative for pulmonary embolus.  Code Status: full code Family Communication: discussed with patient Disposition Plan: discharge home once improved   Consultants:    Procedures:    Antibiotics:    HPI/Subjective: Feeling better today. Abdominal pain is improving. Shortness of breath is improving. Beginning to have productive cough  Objective: Filed Vitals:   02/07/15 1519  BP:   Pulse: 74  Temp:   Resp: 20    Intake/Output Summary (Last 24 hours) at 02/07/15 1648 Last data filed at 02/07/15 1451  Gross per 24 hour  Intake   2789 ml  Output      0 ml  Net   2789 ml   Filed Weights   02/05/15 2039 02/06/15 0314  Weight: 84.913 kg (187 lb 3.2 oz) 84.9 kg (187 lb 2.7 oz)    Exam:   General:  NAD  Cardiovascular: S1, S2 RRR  Respiratory: CTA B  Abdomen: soft, diffusely tender, bs+  Musculoskeletal: no edema b/l   Data Reviewed: Basic Metabolic Panel:  Recent Labs Lab  02/05/15 2047 02/06/15 0342 02/06/15 0916 02/07/15 0629  NA 137 139  --  143  K 3.5 3.1*  --  3.6  CL 106 108  --  115*  CO2 20 22  --  21  GLUCOSE 119* 96  --  128*  BUN 15 17  --  13  CREATININE 0.80 0.79  --  0.69  CALCIUM 9.4 8.4  --  8.2*  MG  --   --  1.9  --    Liver Function Tests:  Recent Labs Lab 02/05/15 2047 02/06/15 0342 02/07/15 0629  AST 16 84* 20  ALT 18 57* 27  ALKPHOS 70 81 61  BILITOT 0.5 0.5 0.3  PROT 8.2 6.7 5.7*  ALBUMIN 4.4 3.6 3.1*    Recent Labs Lab 02/05/15 2115 02/06/15 0342 02/07/15 0629  LIPASE 207* 184* 40   No results for input(s): AMMONIA in the last 168 hours. CBC:  Recent Labs Lab 02/05/15 2047 02/06/15 0342 02/07/15 0629  WBC 14.5* 10.5 6.5  NEUTROABS 9.6*  --   --   HGB 14.6 12.8 10.9*  HCT 43.3 38.8 33.6*  MCV 87.1 89.0 91.3  PLT 538* 447* 339   Cardiac Enzymes:  Recent Labs Lab 02/05/15 2047 02/06/15 0342 02/06/15 0916 02/06/15 1519  TROPONINI <0.03 <0.03 <0.03 <0.03   BNP (last 3 results)  Recent Labs  02/06/15 0335  BNP 15.0    ProBNP (last 3 results) No results for input(s): PROBNP in the last 8760 hours.  CBG: No results for  input(s): GLUCAP in the last 168 hours.  Recent Results (from the past 240 hour(s))  Clostridium Difficile by PCR     Status: None   Collection Time: 02/06/15 12:30 PM  Result Value Ref Range Status   C difficile by pcr NEGATIVE NEGATIVE Final     Studies: Dg Chest 2 View  02/05/2015   CLINICAL DATA:  Acute onset of right upper back pain, radiating to the chest, with nausea and shortness of breath. Initial encounter.  EXAM: CHEST  2 VIEW  COMPARISON:  Chest radiograph performed 03/07/2014  FINDINGS: The lungs are well-aerated and clear. There is no evidence of focal opacification, pleural effusion or pneumothorax.  The heart is normal in size; the mediastinal contour is within normal limits. No acute osseous abnormalities are seen.  IMPRESSION: No acute cardiopulmonary  process seen. No displaced rib fractures identified.   Electronically Signed   By: Garald Balding M.D.   On: 02/05/2015 21:47   Ct Angio Chest Pe W/cm &/or Wo Cm  02/06/2015   CLINICAL DATA:  Acute onset of right-sided pleuritic chest pain, radiating to the upper epigastric region, right arm and right side of the jaw. Near syncope. Initial encounter.  EXAM: CT ANGIOGRAPHY CHEST WITH CONTRAST  TECHNIQUE: Multidetector CT imaging of the chest was performed using the standard protocol during bolus administration of intravenous contrast. Multiplanar CT image reconstructions and MIPs were obtained to evaluate the vascular anatomy.  CONTRAST:  144mL OMNIPAQUE IOHEXOL 350 MG/ML SOLN  COMPARISON:  Chest radiograph performed 02/05/2015  FINDINGS: There is no evidence of pulmonary embolus.  The lungs are essentially clear bilaterally. There is no evidence of significant focal consolidation, pleural effusion or pneumothorax. No masses are identified; no abnormal focal contrast enhancement is seen.  Mild diffuse wall thickening is noted along the mid to distal esophagus, concerning for esophagitis. A tiny hiatal hernia is noted.  The mediastinum is otherwise unremarkable. No mediastinal lymphadenopathy is seen. No pericardial effusion is identified. The great vessels are grossly unremarkable in appearance. No axillary lymphadenopathy is seen. The visualized portions of the thyroid gland are unremarkable in appearance.  The visualized portions of the liver and spleen are unremarkable. The visualized portions of the pancreas, stomach, adrenal glands and kidneys are within normal limits.  No acute osseous abnormalities are seen.  Review of the MIP images confirms the above findings.  IMPRESSION: 1. No evidence of pulmonary embolus. 2. Lungs clear bilaterally. 3. Mild diffuse wall thickening along the mid to distal esophagus raises concern for esophagitis, though it could also reflect chronic inflammation. Would correlate for  associated symptoms. 4. Tiny hiatal hernia seen.   Electronically Signed   By: Garald Balding M.D.   On: 02/06/2015 03:38   Dg Abd 2 Views  02/05/2015   CLINICAL DATA:  Acute onset of right upper back pain radiating to the chest. Diarrhea for 2 days. Initial encounter.  EXAM: ABDOMEN - 2 VIEW  COMPARISON:  Abdominal radiograph, and CT of the abdomen and pelvis, performed 03/07/2014  FINDINGS: The visualized bowel gas pattern is unremarkable. Scattered air and stool filled loops of colon are seen; no abnormal dilatation of small bowel loops is seen to suggest small bowel obstruction. No free intra-abdominal air is identified on the provided upright view. Clips are noted within the right upper quadrant, reflecting prior cholecystectomy.  The visualized osseous structures are within normal limits; the sacroiliac joints are unremarkable in appearance. The visualized lung bases are essentially clear.  IMPRESSION: Unremarkable bowel gas  pattern; no free intra-abdominal air seen. No significant stool seen within the colon.   Electronically Signed   By: Garald Balding M.D.   On: 02/05/2015 21:48   US Abdomen Limited Ruq  02/06/2015   CLINICAL DATA:  Elevated liver function tests.  EXAM: US ABDOMEN LIMITED - RIGHT UPPER QUADRANT  COMPARISON:  CT scan of March 07, 2014.  FINDINGS: Gallbladder:  Status post cholecystectomy.  Common bile duct:  Diameter: 7 mm which is within normal limits status post cholecystectomy.  Liver:  Liver measures greater than 20 cm in diameter consistent with hepatomegaly. Increased echogenicity is noted suggesting fatty infiltration or other diffuse hepatocellular disease. No focal abnormality is noted.  IMPRESSION: Status post cholecystectomy.  No biliary dilatation is noted.  Hepatomegaly is noted with increased echogenicity of hepatic parenchyma suggesting fatty infiltration or other diffuse hepatocellular disease.   Electronically Signed   By: Marijo Conception, M.D.   On: 02/06/2015 15:22     Scheduled Meds: . enoxaparin (LOVENOX) injection  40 mg Subcutaneous Q24H  . famotidine (PEPCID) IV  20 mg Intravenous Q12H  . pantoprazole (PROTONIX) IV  40 mg Intravenous Q12H   Continuous Infusions: . sodium chloride 75 mL/hr at 02/07/15 1451    Principal Problem:   Pancreatitis Active Problems:   GERD (gastroesophageal reflux disease)   Acute pancreatitis   Elevated LFTs    Time spent: 78mins    [name],[name]  [facility] Pager [phone]. If 7PM-7AM, please contact night-coverage at [email or web address], password [facility] 02/07/2015, 4:48 PM  LOS: 1 day

## 2015-02-08 DIAGNOSIS — R109 Unspecified abdominal pain: Secondary | ICD-10-CM

## 2015-02-08 DIAGNOSIS — G8929 Other chronic pain: Secondary | ICD-10-CM | POA: Insufficient documentation

## 2015-02-08 MED ORDER — PANTOPRAZOLE SODIUM 40 MG PO TBEC: 40.0000 mg | DELAYED_RELEASE_TABLET | Freq: Two times a day (BID) | ORAL | Status: DC

## 2015-02-08 MED ORDER — CYCLOBENZAPRINE HCL 10 MG PO TABS
5.0000 mg | ORAL_TABLET | ORAL | Status: AC
  Administered 2015-02-08: 5 mg via ORAL
  Filled 2015-02-08: qty 1

## 2015-02-08 MED ORDER — OXYCODONE-ACETAMINOPHEN 5-325 MG PO TABS: 1.0000 | ORAL_TABLET | ORAL | Status: DC | PRN

## 2015-02-08 MED ORDER — ONDANSETRON HCL 4 MG PO TABS: 4.0000 mg | ORAL_TABLET | Freq: Four times a day (QID) | ORAL | Status: DC | PRN

## 2015-02-08 MED ORDER — SIMETHICONE 80 MG PO CHEW
160.0000 mg | CHEWABLE_TABLET | Freq: Once | ORAL | Status: AC
  Administered 2015-02-08: 160 mg via ORAL
  Filled 2015-02-08: qty 2

## 2015-02-08 MED ORDER — LOPERAMIDE HCL 2 MG PO CAPS
4.0000 mg | ORAL_CAPSULE | Freq: Once | ORAL | Status: AC
  Administered 2015-02-08: 4 mg via ORAL
  Filled 2015-02-08: qty 2

## 2015-02-08 MED ORDER — ALBUTEROL SULFATE (2.5 MG/3ML) 0.083% IN NEBU: 2.5000 mg | INHALATION_SOLUTION | Freq: Four times a day (QID) | RESPIRATORY_TRACT | Status: DC | PRN

## 2015-02-08 NOTE — Discharge Summary (Signed)
Physician Discharge Summary  Rita [name] XNA:[id number] DOB: December 09, 1966 DOA: 02/05/2015  PCP: [place] date: 02/05/2015 Discharge date: 02/08/2015  Time spent: 40 minutes  Recommendations for Outpatient Follow-up:  1. Follow up with primary care physician in 1-2 weeks  Discharge Diagnoses:  Principal Problem:   Pancreatitis Active Problems:   GERD (gastroesophageal reflux disease)   Acute pancreatitis   Elevated LFTs   Abdominal pain Chest pain, atypical Diarrhea COPD  Discharge Condition: improved  Diet recommendation: low salt  Filed Weights   02/05/15 2039 02/06/15 0314  Weight: 84.913 kg (187 lb 3.2 oz) 84.9 kg (187 lb 2.7 oz)    History of present illness:  This patient presents to the hospital with complaints of right-sided pleuritic chest pain. She also had epigastric pain, right arm pain and right jaw pain. Workup in the emergency room indicated an elevated lipase. CT angiography of chest was negative for PE and EKG was nonacute. Troponins were found to be negative. She is admitted for further treatment of pancreatitis  Hospital Course:  Patient was treated with bowel rest, IV fluids and pain management. Her symptoms improved and her diet was able to be advanced to solid diet. Patient is feeling significantly improved and her lipase/LFTs have normalized. Abdominal ultrasound indicated that she had a prior cholecystitis and there was evidence of fatty liver.  Patient ruled out for ACS with negative cardiac markers. EKG remained nonacute.  She she does describe significant shortness of breath and is a long-time smoker. She is beside mobility treatments with significant improvement of her overall breathing. It is likely that she has component of COPD and may benefit from outpatient PFTs.  Patient is now tolerating a solid diet. Her pain appears to be reasonably controlled. She'll be discharged home  today.  Procedures:    Consultations:    Discharge Exam: Filed Vitals:   02/08/15 0545  BP: 140/70  Pulse: 72  Temp: 98.2 F (36.8 C)  Resp: 20    General: NAD Cardiovascular: S1, S2 RRR Respiratory: CTA B  Discharge Instructions   Discharge Instructions    Diet - low sodium heart healthy    Complete by:  As directed      Increase activity slowly    Complete by:  As directed           Discharge Medication List as of 02/08/2015  3:44 PM    START taking these medications   Details  albuterol (PROVENTIL) (2.5 MG/3ML) 0.083% nebulizer solution Take 3 mLs (2.5 mg total) by nebulization every 6 (six) hours as needed for wheezing or shortness of breath., Starting 02/08/2015, Until Discontinued, Normal    ondansetron (ZOFRAN) 4 MG tablet Take 1 tablet (4 mg total) by mouth every 6 (six) hours as needed for nausea., Starting 02/08/2015, Until Discontinued, Print    oxyCODONE-acetaminophen (PERCOCET/ROXICET) 5-325 MG per tablet Take 1-2 tablets by mouth every 4 (four) hours as needed for moderate pain., Starting 02/08/2015, Until Discontinued, Print    pantoprazole (PROTONIX) 40 MG tablet Take 1 tablet (40 mg total) by mouth 2 (two) times daily., Starting 02/08/2015, Until Discontinued, Print      CONTINUE these medications which have NOT CHANGED   Details  acetaminophen (TYLENOL) 500 MG tablet Take 1,000 mg by mouth every 6 (six) hours as needed for pain., Until Discontinued, Historical Med    calcium carbonate (TUMS - DOSED IN MG ELEMENTAL CALCIUM) 500 MG chewable tablet Chew 1 tablet by mouth daily as needed  for indigestion or heartburn., Until Discontinued, Historical Med       Allergies  Allergen Reactions  . Ibuprofen Other (See Comments)    ulcers   Follow-up Information    Follow up with [facility]. Schedule an appointment as soon as possible for a visit in 2 weeks.   Specialty:  Occupational Therapy   Contact information:   [address] [place] [address] [phone]        The results of significant diagnostics from this hospitalization (including imaging, microbiology, ancillary and laboratory) are listed below for reference.    Significant Diagnostic Studies: Dg Chest 2 View  02/05/2015   CLINICAL DATA:  Acute onset of right upper back pain, radiating to the chest, with nausea and shortness of breath. Initial encounter.  EXAM: CHEST  2 VIEW  COMPARISON:  Chest radiograph performed 03/07/2014  FINDINGS: The lungs are well-aerated and clear. There is no evidence of focal opacification, pleural effusion or pneumothorax.  The heart is normal in size; the mediastinal contour is within normal limits. No acute osseous abnormalities are seen.  IMPRESSION: No acute cardiopulmonary process seen. No displaced rib fractures identified.   Electronically Signed   By: Garald Balding M.D.   On: 02/05/2015 21:47   Ct Angio Chest Pe W/cm &/or Wo Cm  02/06/2015   CLINICAL DATA:  Acute onset of right-sided pleuritic chest pain, radiating to the upper epigastric region, right arm and right side of the jaw. Near syncope. Initial encounter.  EXAM: CT ANGIOGRAPHY CHEST WITH CONTRAST  TECHNIQUE: Multidetector CT imaging of the chest was performed using the standard protocol during bolus administration of intravenous contrast. Multiplanar CT image reconstructions and MIPs were obtained to evaluate the vascular anatomy.  CONTRAST:  149mL OMNIPAQUE IOHEXOL 350 MG/ML SOLN  COMPARISON:  Chest radiograph performed 02/05/2015  FINDINGS: There is no evidence of pulmonary embolus.  The lungs are essentially clear bilaterally. There is no evidence of significant focal consolidation, pleural effusion or pneumothorax. No masses are identified; no abnormal focal contrast enhancement is seen.  Mild diffuse wall thickening is noted along the mid to distal esophagus, concerning for esophagitis. A tiny hiatal hernia is noted.  The mediastinum is otherwise  unremarkable. No mediastinal lymphadenopathy is seen. No pericardial effusion is identified. The great vessels are grossly unremarkable in appearance. No axillary lymphadenopathy is seen. The visualized portions of the thyroid gland are unremarkable in appearance.  The visualized portions of the liver and spleen are unremarkable. The visualized portions of the pancreas, stomach, adrenal glands and kidneys are within normal limits.  No acute osseous abnormalities are seen.  Review of the MIP images confirms the above findings.  IMPRESSION: 1. No evidence of pulmonary embolus. 2. Lungs clear bilaterally. 3. Mild diffuse wall thickening along the mid to distal esophagus raises concern for esophagitis, though it could also reflect chronic inflammation. Would correlate for associated symptoms. 4. Tiny hiatal hernia seen.   Electronically Signed   By: Garald Balding M.D.   On: 02/06/2015 03:38   Dg Abd 2 Views  02/05/2015   CLINICAL DATA:  Acute onset of right upper back pain radiating to the chest. Diarrhea for 2 days. Initial encounter.  EXAM: ABDOMEN - 2 VIEW  COMPARISON:  Abdominal radiograph, and CT of the abdomen and pelvis, performed 03/07/2014  FINDINGS: The visualized bowel gas pattern is unremarkable. Scattered air and stool filled loops of colon are seen; no abnormal dilatation of small bowel loops  is seen to suggest small bowel obstruction. No free intra-abdominal air is identified on the provided upright view. Clips are noted within the right upper quadrant, reflecting prior cholecystectomy.  The visualized osseous structures are within normal limits; the sacroiliac joints are unremarkable in appearance. The visualized lung bases are essentially clear.  IMPRESSION: Unremarkable bowel gas pattern; no free intra-abdominal air seen. No significant stool seen within the colon.   Electronically Signed   By: Garald Balding M.D.   On: 02/05/2015 21:48   US Abdomen Limited Ruq  02/06/2015   CLINICAL DATA:   Elevated liver function tests.  EXAM: US ABDOMEN LIMITED - RIGHT UPPER QUADRANT  COMPARISON:  CT scan of March 07, 2014.  FINDINGS: Gallbladder:  Status post cholecystectomy.  Common bile duct:  Diameter: 7 mm which is within normal limits status post cholecystectomy.  Liver:  Liver measures greater than 20 cm in diameter consistent with hepatomegaly. Increased echogenicity is noted suggesting fatty infiltration or other diffuse hepatocellular disease. No focal abnormality is noted.  IMPRESSION: Status post cholecystectomy.  No biliary dilatation is noted.  Hepatomegaly is noted with increased echogenicity of hepatic parenchyma suggesting fatty infiltration or other diffuse hepatocellular disease.   Electronically Signed   By: Marijo Conception, M.D.   On: 02/06/2015 15:22    Microbiology: Recent Results (from the past 240 hour(s))  Clostridium Difficile by PCR     Status: None   Collection Time: 02/06/15 12:30 PM  Result Value Ref Range Status   C difficile by pcr NEGATIVE NEGATIVE Final     Labs: Basic Metabolic Panel:  Recent Labs Lab 02/05/15 2047 02/06/15 0342 02/06/15 0916 02/07/15 0629  NA 137 139  --  143  K 3.5 3.1*  --  3.6  CL 106 108  --  115*  CO2 20 22  --  21  GLUCOSE 119* 96  --  128*  BUN 15 17  --  13  CREATININE 0.80 0.79  --  0.69  CALCIUM 9.4 8.4  --  8.2*  MG  --   --  1.9  --    Liver Function Tests:  Recent Labs Lab 02/05/15 2047 02/06/15 0342 02/07/15 0629  AST 16 84* 20  ALT 18 57* 27  ALKPHOS 70 81 61  BILITOT 0.5 0.5 0.3  PROT 8.2 6.7 5.7*  ALBUMIN 4.4 3.6 3.1*    Recent Labs Lab 02/05/15 2115 02/06/15 0342 02/07/15 0629  LIPASE 207* 184* 40   No results for input(s): AMMONIA in the last 168 hours. CBC:  Recent Labs Lab 02/05/15 2047 02/06/15 0342 02/07/15 0629  WBC 14.5* 10.5 6.5  NEUTROABS 9.6*  --   --   HGB 14.6 12.8 10.9*  HCT 43.3 38.8 33.6*  MCV 87.1 89.0 91.3  PLT 538* 447* 339   Cardiac Enzymes:  Recent Labs Lab  02/05/15 2047 02/06/15 0342 02/06/15 0916 02/06/15 1519  TROPONINI <0.03 <0.03 <0.03 <0.03   BNP: BNP (last 3 results)  Recent Labs  02/06/15 0335  BNP 15.0    ProBNP (last 3 results) No results for input(s): PROBNP in the last 8760 hours.  CBG: No results for input(s): GLUCAP in the last 168 hours.     Signed:  [name],[name]  [facility] 02/08/2015, 7:20 PM

## 2015-02-08 NOTE — Progress Notes (Signed)
Pt discharged home today per Dr. Roderic Palau. Pt's IV site D/C'd and WDL. Pt's VSS. Pt provided with home medication list, discharge instructions, pancreatitis teaching sheets, and prescriptions. Verbalized understanding. Pt's DME order for home nebulizer machine faxed to [place]. Pt notified. Pt left floor via WC in stable condition accompanied by NT.

## 2015-02-23 ENCOUNTER — Emergency Department (HOSPITAL_COMMUNITY)
Admission: EM | Admit: 2015-02-23 | Discharge: 2015-02-23 | Disposition: A | Discharge status: Home / Self Care | Payer: Self-pay | Attending: Emergency Medicine | Admitting: Emergency Medicine

## 2015-02-23 ENCOUNTER — Encounter (HOSPITAL_COMMUNITY): Payer: Self-pay | Admitting: *Deleted

## 2015-02-23 DIAGNOSIS — K029 Dental caries, unspecified: Secondary | ICD-10-CM | POA: Insufficient documentation

## 2015-02-23 DIAGNOSIS — Z720 Tobacco use: Secondary | ICD-10-CM | POA: Insufficient documentation

## 2015-02-23 DIAGNOSIS — K219 Gastro-esophageal reflux disease without esophagitis: Secondary | ICD-10-CM | POA: Insufficient documentation

## 2015-02-23 DIAGNOSIS — K047 Periapical abscess without sinus: Secondary | ICD-10-CM | POA: Insufficient documentation

## 2015-02-23 DIAGNOSIS — G8929 Other chronic pain: Secondary | ICD-10-CM | POA: Insufficient documentation

## 2015-02-23 MED ORDER — ONDANSETRON HCL 4 MG PO TABS
4.0000 mg | ORAL_TABLET | Freq: Once | ORAL | Status: AC
  Administered 2015-02-23: 4 mg via ORAL
  Filled 2015-02-23: qty 1

## 2015-02-23 MED ORDER — CLINDAMYCIN HCL 150 MG PO CAPS: ORAL_CAPSULE | ORAL | Status: DC

## 2015-02-23 MED ORDER — HYDROCODONE-ACETAMINOPHEN 5-325 MG PO TABS: 1.0000 | ORAL_TABLET | ORAL | Status: DC | PRN

## 2015-02-23 MED ORDER — LIDOCAINE HCL (PF) 1 % IJ SOLN
INTRAMUSCULAR | Status: AC
  Filled 2015-02-23: qty 5

## 2015-02-23 MED ORDER — CEFTRIAXONE SODIUM 1 G IJ SOLR
1.0000 g | Freq: Once | INTRAMUSCULAR | Status: AC
  Administered 2015-02-23: 1 g via INTRAMUSCULAR
  Filled 2015-02-23: qty 10

## 2015-02-23 MED ORDER — HYDROCODONE-ACETAMINOPHEN 5-325 MG PO TABS
2.0000 | ORAL_TABLET | Freq: Once | ORAL | Status: AC
  Administered 2015-02-23: 2 via ORAL
  Filled 2015-02-23: qty 2

## 2015-02-23 NOTE — ED Notes (Signed)
Mult dental caries, with facial swelling

## 2015-02-23 NOTE — Discharge Instructions (Signed)
It is IMPORTANT that you see a dentist as soon as possible. Abscessed Tooth An abscessed tooth is an infection around your tooth. It may be caused by holes or damage to the tooth (cavity) or a dental disease. An abscessed tooth causes mild to very bad pain in and around the tooth. See your dentist right away if you have tooth or gum pain. HOME CARE  Take your medicine as told. Finish it even if you start to feel better.  Do not drive after taking pain medicine.  Rinse your mouth (gargle) often with salt water ( teaspoon salt in 8 ounces of warm water).  Do not apply heat to the outside of your face. GET HELP RIGHT AWAY IF:   You have a temperature by mouth above 102 F (38.9 C), not controlled by medicine.  You have chills and a very bad headache.  You have problems breathing or swallowing.  Your mouth will not open.  You develop puffiness (swelling) on the neck or around the eye.  Your pain is not helped by medicine.  Your pain is getting worse instead of better. MAKE SURE YOU:   Understand these instructions.  Will watch your condition.  Will get help right away if you are not doing well or get worse. Document Released: 05/30/2008 Document Revised: 03/05/2012 Document Reviewed: 03/22/2011 [facility] Patient Information 2015 [place], [place]. This information is not intended to replace advice given to you by your health care provider. Make sure you discuss any questions you have with your health care provider.  Dental Caries Dental caries is tooth decay. This decay can cause a hole in teeth (cavity) that can get bigger and deeper over time. HOME CARE  Brush and floss your teeth. Do this at least two times a day.  Use a fluoride toothpaste.  Use a mouth rinse if told by your dentist or doctor.  Eat less sugary and starchy foods. Drink less sugary drinks.  Avoid snacking often on sugary and starchy foods. Avoid sipping often on sugary drinks.  Keep regular checkups  and cleanings with your dentist.  Use fluoride supplements if told by your dentist or doctor.  Allow fluoride to be applied to teeth if told by your dentist or doctor. Document Released: 09/20/2008 Document Revised: 04/28/2014 Document Reviewed: 12/14/2012 [facility] Patient Information 2015 [place], [place]. This information is not intended to replace advice given to you by your health care provider. Make sure you discuss any questions you have with your health care provider.

## 2015-02-23 NOTE — ED Provider Notes (Signed)
CSN: [id number]     Arrival date & time 02/23/15  1040 History  This chart was scribed for non-physician practitioner Lily Kocher, PA-C, working with Maudry Diego, MD by Zola Button, ED Scribe. This patient was seen in room APFT23/APFT23 and the patient's care was started at 11:53 AM.      No chief complaint on file.  Patient is a 49 y.o. female presenting with tooth pain. The history is provided by the patient. No language interpreter was used.  Dental Pain Location:  Upper Severity:  Severe Onset quality:  Gradual Duration:  1 day Timing:  Constant Progression:  Worsening Chronicity:  New Context: abscess   Relieved by:  None tried Worsened by:  Nothing tried Ineffective treatments:  None tried Associated symptoms: facial pain and facial swelling   Risk factors: lack of dental care    HPI Comments: Rita [name] is a 49 y.o. female who presents to the Emergency Department complaining of gradual onset left-sided facial swelling with pain that started yesterday but worsened this morning around 2:00 AM. Patient states the pain woke her from her sleep. She states she has an abscessed tooth. She also reports having diaphoresis. Patient did talk to the dentist this morning, but she is waiting for her taxes before she sees the dentist as she does not currently have insurance. NKDA.  No PCP per patient; patient is uninsured  Past Medical History  Diagnosis Date  . Pancreatitis   . Peptic ulcer   . GERD (gastroesophageal reflux disease)   . Hiatal hernia   . Chronic back pain   . Chronic abdominal pain   . Barrett's esophagus with esophagitis 03/26/2013  . Tobacco use 03/26/2013   Past Surgical History  Procedure Laterality Date  . Cholecystectomy    . Cesarean section    . Tubal ligation    . Esophagogastroduodenoscopy  Oct 2011    Dr. Laural Golden: ulcer in distal esophagus, soft stricture at GE junction s/p balloon dilation PATH: BARRETT'S  . Esophagogastroduodenoscopy (egd) with  esophageal dilation N/A 03/27/2013    Procedure: ESOPHAGOGASTRODUODENOSCOPY (EGD) WITH ESOPHAGEAL DILATION;  Surgeon: Daneil Dolin, MD;  Location: [facility];  Service: Endoscopy;  Laterality: N/A;  possible dilation  . Eus N/A 05/09/2013    Procedure: UPPER ENDOSCOPIC ULTRASOUND (EUS) LINEAR;  Surgeon: Milus Banister, MD;  Location: [facility];  Service: Endoscopy;  Laterality: N/A;   Family History  Problem Relation Age of Onset  . Asthma Mother   . Heart failure Mother   . Cancer Mother   . Diabetes Mother   . Hypertension Mother   . Stroke Mother   . Heart failure Father   . Diabetes Father   . Colon cancer Neg Hx   . Pancreatic cancer Mother     deceased   History  Substance Use Topics  . Smoking status: Current Every Day Smoker -- 0.50 packs/day for 20 years    Types: Cigarettes  . Smokeless tobacco: Never Used  . Alcohol Use: No   OB History    Gravida Para Term Preterm AB TAB SAB Ectopic Multiple Living   4 3 3  1  1   3      Review of Systems  Constitutional: Positive for diaphoresis.  HENT: Positive for dental problem and facial swelling.   All other systems reviewed and are negative.     Allergies  Ibuprofen  Home Medications   Prior to Admission medications   Medication Sig Start Date End  Date Taking? Authorizing Provider  acetaminophen (TYLENOL) 500 MG tablet Take 1,000 mg by mouth every 6 (six) hours as needed for pain.   Yes Historical Provider, MD  calcium carbonate (TUMS - DOSED IN MG ELEMENTAL CALCIUM) 500 MG chewable tablet Chew 1 tablet by mouth daily as needed for indigestion or heartburn.   Yes Historical Provider, MD  albuterol (PROVENTIL) (2.5 MG/3ML) 0.083% nebulizer solution Take 3 mLs (2.5 mg total) by nebulization every 6 (six) hours as needed for wheezing or shortness of breath. Patient not taking: Reported on 02/23/2015 02/08/15   Kathie Dike, MD  ondansetron (ZOFRAN) 4 MG tablet Take 1 tablet (4 mg total) by mouth every 6 (six)  hours as needed for nausea. Patient not taking: Reported on 02/23/2015 02/08/15   Kathie Dike, MD  oxyCODONE-acetaminophen (PERCOCET/ROXICET) 5-325 MG per tablet Take 1-2 tablets by mouth every 4 (four) hours as needed for moderate pain. Patient not taking: Reported on 02/23/2015 02/08/15   Kathie Dike, MD  pantoprazole (PROTONIX) 40 MG tablet Take 1 tablet (40 mg total) by mouth 2 (two) times daily. Patient not taking: Reported on 02/23/2015 02/08/15   Kathie Dike, MD   BP 140/94 mmHg  Pulse 93  Temp(Src) 98.5 F (36.9 C) (Oral)  Resp 18  Ht 5\' 7"  (1.702 m)  Wt 187 lb (84.823 kg)  BMI 29.28 kg/m2  SpO2 100%  LMP 02/09/2012 Physical Exam  Constitutional: She is oriented to person, place, and time. She appears well-developed and well-nourished. No distress.  HENT:  Head: Normocephalic and atraumatic.  Mouth/Throat: Oropharynx is clear and moist. No oropharyngeal exudate.  Periorbital redness and swelling on the left. TTP in periorbital area under the eye. Puffiness over the labial fold with tenderness. No tenderness of the left mandible. Multiple dental caries of the upper and lower jaw. Tenderness and swelling of the lower gums. Tenderness and swelling over upper molar area. Question small abscess over the left upper premolar. No swelling under the tongue.  Eyes: EOM are normal. Pupils are equal, round, and reactive to light.  Neck: Neck supple.  Cardiovascular: Normal rate, regular rhythm and normal heart sounds.   No murmur heard. Pulmonary/Chest: Effort normal and breath sounds normal. No respiratory distress. She has no wheezes. She has no rales.  Musculoskeletal: She exhibits no edema.  Neurological: She is alert and oriented to person, place, and time. No cranial nerve deficit.  Skin: Skin is warm and dry. No rash noted.  Psychiatric: She has a normal mood and affect. Her behavior is normal.  Nursing note and vitals reviewed.   ED Course  Procedures  DIAGNOSTIC  STUDIES: Oxygen Saturation is 100% on room air, normal by my interpretation.    COORDINATION OF CARE: 12:00 PM-Discussed treatment plan which includes abx and medications with pt at bedside and pt agreed to plan. Urged patient to follow-up with dentist as soon as possible.   Labs Review Labs Reviewed - No data to display  Imaging Review No results found.   EKG Interpretation None      MDM  No fever, no tachycardia. No evidence for Ludwig's angina. Wide spread dental caries present.  Rocephin IM given in the ED. Rx for norco, and clindamycin given.  STRONGLY advised patient of the danger spread of this level of infection and need for dental care as soon as possible. Pt acknowledges understanding of this issue.   Final diagnoses:  None    *I have reviewed nursing notes, vital signs, and all appropriate lab and  imaging results for this patient.**  **I personally performed the services described in this documentation, which was scribed in my presence. The recorded information has been reviewed and is accurate.Lenox Ahr, PA-C 02/23/15 1229  Maudry Diego, MD 02/23/15 [id number]

## 2015-02-25 NOTE — Progress Notes (Signed)
UR chart review completed.  

## 2016-11-18 ENCOUNTER — Emergency Department (HOSPITAL_COMMUNITY): Payer: Self-pay

## 2016-11-18 ENCOUNTER — Encounter (HOSPITAL_COMMUNITY): Payer: Self-pay | Admitting: Emergency Medicine

## 2016-11-18 ENCOUNTER — Emergency Department (HOSPITAL_COMMUNITY)
Admission: EM | Admit: 2016-11-18 | Discharge: 2016-11-18 | Disposition: A | Discharge status: Home / Self Care | Payer: Self-pay | Attending: Emergency Medicine | Admitting: Emergency Medicine

## 2016-11-18 DIAGNOSIS — K2900 Acute gastritis without bleeding: Secondary | ICD-10-CM | POA: Insufficient documentation

## 2016-11-18 DIAGNOSIS — Z79899 Other long term (current) drug therapy: Secondary | ICD-10-CM | POA: Insufficient documentation

## 2016-11-18 DIAGNOSIS — M549 Dorsalgia, unspecified: Secondary | ICD-10-CM | POA: Insufficient documentation

## 2016-11-18 DIAGNOSIS — R0789 Other chest pain: Secondary | ICD-10-CM

## 2016-11-18 DIAGNOSIS — F17210 Nicotine dependence, cigarettes, uncomplicated: Secondary | ICD-10-CM | POA: Insufficient documentation

## 2016-11-18 LAB — BASIC METABOLIC PANEL
Anion gap: 17 — ABNORMAL HIGH (ref 5–15)
BUN: 10 mg/dL (ref 6–20)
CHLORIDE: 98 mmol/L — AB (ref 101–111)
CO2: 20 mmol/L — AB (ref 22–32)
Calcium: 9.5 mg/dL (ref 8.9–10.3)
Creatinine, Ser: 1.04 mg/dL — ABNORMAL HIGH (ref 0.44–1.00)
GFR calc Af Amer: >60 mL/min (ref >60)
GFR calc non Af Amer: >60 mL/min (ref >60)
GLUCOSE: 199 mg/dL — AB (ref 65–99)
POTASSIUM: 2.9 mmol/L — AB (ref 3.5–5.1)
Sodium: 135 mmol/L (ref 135–145)

## 2016-11-18 LAB — CBC
HEMATOCRIT: 44.8 % (ref 36.0–46.0)
Hemoglobin: 15.5 g/dL — ABNORMAL HIGH (ref 12.0–15.0)
MCH: 30.5 pg (ref 26.0–34.0)
MCHC: 34.6 g/dL (ref 30.0–36.0)
MCV: 88.2 fL (ref 78.0–100.0)
Platelets: 208 10*3/uL (ref 150–400)
RBC: 5.08 MIL/uL (ref 3.87–5.11)
RDW: 13.5 % (ref 11.5–15.5)
WBC: 15.2 10*3/uL — AB (ref 4.0–10.5)

## 2016-11-18 LAB — URINALYSIS, ROUTINE W REFLEX MICROSCOPIC
BILIRUBIN URINE: NEGATIVE
GLUCOSE, UA: NEGATIVE mg/dL
Leukocytes, UA: NEGATIVE
Nitrite: NEGATIVE
PH: 7.5 (ref 5.0–8.0)
PROTEIN: 100 mg/dL — AB
Specific Gravity, Urine: 1.015 (ref 1.005–1.030)

## 2016-11-18 LAB — HEPATIC FUNCTION PANEL
ALK PHOS: 62 U/L (ref 38–126)
ALT: 26 U/L (ref 14–54)
AST: 49 U/L — ABNORMAL HIGH (ref 15–41)
Albumin: 4.2 g/dL (ref 3.5–5.0)
BILIRUBIN DIRECT: 0.1 mg/dL (ref 0.1–0.5)
BILIRUBIN INDIRECT: 0.4 mg/dL (ref 0.3–0.9)
BILIRUBIN TOTAL: 0.5 mg/dL (ref 0.3–1.2)
Total Protein: 8.2 g/dL — ABNORMAL HIGH (ref 6.5–8.1)

## 2016-11-18 LAB — URINE MICROSCOPIC-ADD ON

## 2016-11-18 LAB — TROPONIN I: Troponin I: <0.03 ng/mL (ref <0.03)

## 2016-11-18 LAB — LIPASE, BLOOD: LIPASE: 20 U/L (ref 11–51)

## 2016-11-18 MED ORDER — ONDANSETRON HCL 4 MG PO TABS: 4.0000 mg | ORAL_TABLET | Freq: Four times a day (QID) | ORAL | 0 refills | Status: DC | PRN

## 2016-11-18 MED ORDER — SUCRALFATE 1 GM/10ML PO SUSP: 1.0000 g | Freq: Three times a day (TID) | ORAL | 0 refills | Status: DC

## 2016-11-18 MED ORDER — MORPHINE SULFATE (PF) 4 MG/ML IV SOLN
4.0000 mg | Freq: Once | INTRAVENOUS | Status: AC
  Administered 2016-11-18: 4 mg via INTRAVENOUS
  Filled 2016-11-18: qty 1

## 2016-11-18 MED ORDER — SUCRALFATE 1 GM/10ML PO SUSP
1.0000 g | Freq: Once | ORAL | Status: AC
  Administered 2016-11-18: 1 g via ORAL
  Filled 2016-11-18: qty 10

## 2016-11-18 MED ORDER — SODIUM CHLORIDE 0.9 % IV BOLUS (SEPSIS)
1000.0000 mL | Freq: Once | INTRAVENOUS | Status: AC
  Administered 2016-11-18: 1000 mL via INTRAVENOUS

## 2016-11-18 MED ORDER — ONDANSETRON HCL 4 MG/2ML IJ SOLN
4.0000 mg | Freq: Once | INTRAMUSCULAR | Status: AC
  Administered 2016-11-18: 4 mg via INTRAVENOUS
  Filled 2016-11-18: qty 2

## 2016-11-18 MED ORDER — PANTOPRAZOLE SODIUM 40 MG PO TBEC: 40.0000 mg | DELAYED_RELEASE_TABLET | Freq: Every day | ORAL | 1 refills | Status: DC

## 2016-11-18 MED ORDER — HYDROMORPHONE HCL 1 MG/ML IJ SOLN
1.0000 mg | Freq: Once | INTRAMUSCULAR | Status: AC
  Administered 2016-11-18: 1 mg via INTRAVENOUS
  Filled 2016-11-18: qty 1

## 2016-11-18 MED ORDER — GI COCKTAIL ~~LOC~~
30.0000 mL | Freq: Once | ORAL | Status: AC
  Administered 2016-11-18: 30 mL via ORAL
  Filled 2016-11-18: qty 30

## 2016-11-18 MED ORDER — POTASSIUM CHLORIDE CRYS ER 20 MEQ PO TBCR
40.0000 meq | EXTENDED_RELEASE_TABLET | Freq: Once | ORAL | Status: AC
  Administered 2016-11-18: 40 meq via ORAL
  Filled 2016-11-18: qty 2

## 2016-11-18 NOTE — ED Triage Notes (Addendum)
Per EMS: PT reports cp 2 days ago in center of chest with pain upon palpation.  PT has hx pancreatitis, nauseated today.  PT has labored breathing, feels like arms and legs are "cramping".  Pt has hx low potassium.  Pt took 324 asa.  Pt alert and oriented.   146/80

## 2016-11-18 NOTE — ED Provider Notes (Signed)
[facility] DEPT Provider Note   CSN: [id number] Arrival date & time: 11/18/16  [id number] By signing my name below, I, Doran Stabler, attest that this documentation has been prepared under the direction and in the presence of Julianne Rice, MD. Electronically Signed: Doran Stabler, ED Scribe. 11/18/16. 10:21 AM.  History   Chief Complaint Chief Complaint  Patient presents with  . Chest Pain   The history is provided by the patient and medical records.   HPI Comments: Rita [name] is a 50 y.o. female who presents to the Emergency Department with a PMHx of pancreatitis and GERD complaining of right sided chest pain radiating to her back that began 2 days ago. Pt also reports feeling feverish. She states her pain is exacerbated while eating and taking deep breaths.Similar to previous exacerbations of her pancreatitis. Pt took four 81 mg Aspirin with no relief. Pt denies any SOB, cough, N/V/D, lower extremity swelling or pain or any other symptoms at this time.   Past Medical History:  Diagnosis Date  . Barrett's esophagus with esophagitis 03/26/2013  . Chronic abdominal pain   . Chronic back pain   . GERD (gastroesophageal reflux disease)   . Hiatal hernia   . Pancreatitis   . Peptic ulcer   . Tobacco use 03/26/2013   Patient Active Problem List   Diagnosis Date Noted  . Abdominal pain   . Pancreatitis 02/06/2015  . Acute pancreatitis   . Elevated LFTs   . Pancreatitis, acute 05/09/2013  . Hypokalemia 03/27/2013  . Anemia 03/27/2013  . Chronic back pain 03/26/2013  . GERD (gastroesophageal reflux disease) 03/26/2013  . Tobacco use 03/26/2013  . Barrett's esophagus with esophagitis 03/26/2013  . Elevated lipase 08/20/2011   Past Surgical History:  Procedure Laterality Date  . CESAREAN SECTION    . CHOLECYSTECTOMY    . ESOPHAGOGASTRODUODENOSCOPY  Oct 2011   Dr. Laural Golden: ulcer in distal esophagus, soft stricture at GE junction s/p balloon dilation PATH: BARRETT'S  .  ESOPHAGOGASTRODUODENOSCOPY (EGD) WITH ESOPHAGEAL DILATION N/A 03/27/2013   Procedure: ESOPHAGOGASTRODUODENOSCOPY (EGD) WITH ESOPHAGEAL DILATION;  Surgeon: Daneil Dolin, MD;  Location: [facility];  Service: Endoscopy;  Laterality: N/A;  possible dilation  . EUS N/A 05/09/2013   Procedure: UPPER ENDOSCOPIC ULTRASOUND (EUS) LINEAR;  Surgeon: Milus Banister, MD;  Location: [facility];  Service: Endoscopy;  Laterality: N/A;  . TUBAL LIGATION     OB History    Gravida Para Term Preterm AB Living   4 3 3   1 3    SAB TAB Ectopic Multiple Live Births   1             Home Medications    Prior to Admission medications   Medication Sig Start Date End Date Taking? Authorizing Provider  calcium carbonate (TUMS - DOSED IN MG ELEMENTAL CALCIUM) 500 MG chewable tablet Chew 1 tablet by mouth daily as needed for indigestion or heartburn.   Yes Historical Provider, MD  ondansetron (ZOFRAN) 4 MG tablet Take 1 tablet (4 mg total) by mouth every 6 (six) hours as needed for nausea or vomiting. 11/18/16   Julianne Rice, MD  pantoprazole (PROTONIX) 40 MG tablet Take 1 tablet (40 mg total) by mouth daily. 11/18/16   Julianne Rice, MD  sucralfate (CARAFATE) 1 GM/10ML suspension Take 10 mLs (1 g total) by mouth 4 (four) times daily -  with meals and at bedtime. 11/18/16   Julianne Rice, MD   Family History Family History  Problem Relation Age  of Onset  . Asthma Mother   . Heart failure Mother   . Cancer Mother   . Diabetes Mother   . Hypertension Mother   . Stroke Mother   . Pancreatic cancer Mother     deceased  . Heart failure Father   . Diabetes Father   . Colon cancer Neg Hx    Social History Social History  Substance Use Topics  . Smoking status: Current Every Day Smoker    Packs/day: 0.50    Years: 20.00    Types: Cigarettes  . Smokeless tobacco: Never Used  . Alcohol use No   Allergies   Ibuprofen  Review of Systems Review of Systems  Constitutional: Positive for fever.  Negative for chills.  Respiratory: Negative for cough and shortness of breath.   Cardiovascular: Positive for chest pain. Negative for palpitations and leg swelling.  Gastrointestinal: Positive for abdominal pain, nausea and vomiting. Negative for blood in stool, constipation and diarrhea.  Genitourinary: Negative for difficulty urinating, dysuria and flank pain.  Musculoskeletal: Positive for back pain. Negative for myalgias, neck pain and neck stiffness.  Skin: Negative for rash.  Neurological: Negative for dizziness, weakness, light-headedness, numbness and headaches.  All other systems reviewed and are negative.   Physical Exam Updated Vital Signs BP 136/80   Pulse 75   Temp 98.3 F (36.8 C) (Oral)   Resp 13   Ht 5\' 6"  (1.676 m)   Wt 180 lb (81.6 kg)   LMP 02/09/2012   SpO2 94%   BMI 29.05 kg/m   Physical Exam  Constitutional: She is oriented to person, place, and time. She appears well-developed and well-nourished.  HENT:  Head: Normocephalic and atraumatic.  Mouth/Throat: Oropharynx is clear and moist. No oropharyngeal exudate.  Eyes: EOM are normal. Pupils are equal, round, and reactive to light.  Neck: Normal range of motion. Neck supple.  Cardiovascular: Normal rate and regular rhythm.  Exam reveals no gallop and no friction rub.   No murmur heard. Pulmonary/Chest: Effort normal and breath sounds normal. No respiratory distress. She has no wheezes. She has no rales. She exhibits no tenderness.  Abdominal: Soft. Bowel sounds are normal. There is tenderness (epigastricand right upper quadrant tenderness with palpation. No rebound or guarding.). There is no rebound and no guarding.  Musculoskeletal: Normal range of motion. She exhibits tenderness. She exhibits no edema.  Right CVA tenderness with outpatient. No lower extremity swelling, asymmetry or tenderness. Distal pulses are equal  Neurological: She is alert and oriented to person, place, and time.  Moving all  extremities without deficit. Sensation is intact.  Skin: Skin is warm and dry. No rash noted. No erythema.  Psychiatric: She has a normal mood and affect. Her behavior is normal.  Nursing note and vitals reviewed.   ED Treatments / Results  DIAGNOSTIC STUDIES: Oxygen Saturation is 98% on room air, normal by my interpretation.    COORDINATION OF CARE: 10:21 AM Discussed treatment plan with pt at bedside and pt agreed to plan.  Labs (all labs ordered are listed, but only abnormal results are displayed) Labs Reviewed  BASIC METABOLIC PANEL - Abnormal; Notable for the following:       Result Value   Potassium 2.9 (*)    Chloride 98 (*)    CO2 20 (*)    Glucose, Bld 199 (*)    Creatinine, Ser 1.04 (*)    Anion gap 17 (*)    All other components within normal limits  CBC - Abnormal;  Notable for the following:    WBC 15.2 (*)    Hemoglobin 15.5 (*)    All other components within normal limits  HEPATIC FUNCTION PANEL - Abnormal; Notable for the following:    Total Protein 8.2 (*)    AST 49 (*)    All other components within normal limits  URINALYSIS, ROUTINE W REFLEX MICROSCOPIC (NOT AT [facility]) - Abnormal; Notable for the following:    Hgb urine dipstick TRACE (*)    Ketones, ur TRACE (*)    Protein, ur 100 (*)    All other components within normal limits  URINE MICROSCOPIC-ADD ON - Abnormal; Notable for the following:    Squamous Epithelial / LPF 0-5 (*)    Bacteria, UA FEW (*)    All other components within normal limits  TROPONIN I  LIPASE, BLOOD    EKG  EKG Interpretation  Date/Time:  Friday November 18 2016 10:03:26 EST Ventricular Rate:  93 PR Interval:    QRS Duration: 153 QT Interval:  431 QTC Calculation: 537 R Axis:   59 Text Interpretation:  Sinus rhythm Right bundle branch block Confirmed by Lita Mains  MD, Lashane Whelpley ([id number]) on 11/18/2016 10:50:36 AM      Radiology Dg Chest 2 View  Result Date: 11/18/2016 CLINICAL DATA:  Chest pain. EXAM: CHEST  2 VIEW  COMPARISON:  CT 02/06/2015.  Chest x-ray 02/05/2015. FINDINGS: Mediastinum and hilar structures normal. Low lung volumes with mild bibasilar subsegmental atelectasis. Lungs are clear. Heart size normal. No pleural effusion or pneumothorax. No acute bony abnormality. IMPRESSION: Low lung volumes with mild basilar subsegmental atelectasis. Electronically Signed   By: Marcello Moores  Register   On: 11/18/2016 11:07    Procedures Procedures (including critical care time)  Medications Ordered in ED Medications  morphine 4 MG/ML injection 4 mg (4 mg Intravenous Given 11/18/16 1049)  ondansetron (ZOFRAN) injection 4 mg (4 mg Intravenous Given 11/18/16 1049)  sodium chloride 0.9 % bolus 1,000 mL (0 mLs Intravenous Stopped 11/18/16 1157)  HYDROmorphone (DILAUDID) injection 1 mg (1 mg Intravenous Given 11/18/16 1141)  gi cocktail (Maalox,Lidocaine,Donnatal) (30 mLs Oral Given 11/18/16 1140)  sodium chloride 0.9 % bolus 1,000 mL (1,000 mLs Intravenous New Bag/Given 11/18/16 1158)  potassium chloride SA (K-DUR,KLOR-CON) CR tablet 40 mEq (40 mEq Oral Given 11/18/16 1140)    Initial Impression / Assessment and Plan / ED Course  I have reviewed the triage vital signs and the nursing notes.  Pertinent labs & imaging results that were available during my care of the patient were reviewed by me and considered in my medical decision making (see chart for details).  Clinical Course    Patient states she is feeling much better. Symptoms improved after GI cocktail. Liver function tests and lipase are normal. States that she ran out of her PPI. Symptoms likely due to gastritis with reflux. Patient has had right bundle branch block in the past. No evidence of ischemia. Normal troponin. This would be very atypical presentation for coronary artery disease. Given by mouth potassium replacement in the emergency department. She is advised to follow-up with Dr. Oneida Alar. Return precautions have been given.  Final Clinical  Impressions(s) / ED Diagnoses   Final diagnoses:  Acute gastritis without hemorrhage, unspecified gastritis type  Atypical chest pain   New Prescriptions New Prescriptions   ONDANSETRON (ZOFRAN) 4 MG TABLET    Take 1 tablet (4 mg total) by mouth every 6 (six) hours as needed for nausea or vomiting.   SUCRALFATE (CARAFATE) 1 GM/10ML  SUSPENSION    Take 10 mLs (1 g total) by mouth 4 (four) times daily -  with meals and at bedtime.   I personally performed the services described in this documentation, which was scribed in my presence. The recorded information has been reviewed and is accurate.      Julianne Rice, MD 11/18/16 1416

## 2016-11-18 NOTE — ED Notes (Signed)
ED Provider at bedside. 

## 2016-11-18 NOTE — ED Notes (Signed)
MD at bedside. 

## 2016-12-21 ENCOUNTER — Emergency Department (HOSPITAL_COMMUNITY): Payer: Self-pay

## 2016-12-21 ENCOUNTER — Emergency Department (HOSPITAL_COMMUNITY)
Admission: EM | Admit: 2016-12-21 | Discharge: 2016-12-21 | Disposition: A | Discharge status: Home / Self Care | Payer: Self-pay | Attending: Emergency Medicine | Admitting: Emergency Medicine

## 2016-12-21 ENCOUNTER — Encounter (HOSPITAL_COMMUNITY): Payer: Self-pay | Admitting: Emergency Medicine

## 2016-12-21 DIAGNOSIS — M25552 Pain in left hip: Secondary | ICD-10-CM | POA: Insufficient documentation

## 2016-12-21 DIAGNOSIS — F17210 Nicotine dependence, cigarettes, uncomplicated: Secondary | ICD-10-CM | POA: Insufficient documentation

## 2016-12-21 DIAGNOSIS — M545 Low back pain: Secondary | ICD-10-CM | POA: Insufficient documentation

## 2016-12-21 MED ORDER — DEXAMETHASONE SODIUM PHOSPHATE 4 MG/ML IJ SOLN
8.0000 mg | Freq: Once | INTRAMUSCULAR | Status: AC
  Administered 2016-12-21: 8 mg via INTRAMUSCULAR
  Filled 2016-12-21: qty 2

## 2016-12-21 MED ORDER — CYCLOBENZAPRINE HCL 10 MG PO TABS: 10.0000 mg | ORAL_TABLET | Freq: Three times a day (TID) | ORAL | 0 refills | Status: DC

## 2016-12-21 MED ORDER — DIAZEPAM 5 MG PO TABS
10.0000 mg | ORAL_TABLET | Freq: Once | ORAL | Status: AC
  Administered 2016-12-21: 10 mg via ORAL
  Filled 2016-12-21: qty 2

## 2016-12-21 MED ORDER — TRAMADOL HCL 50 MG PO TABS
100.0000 mg | ORAL_TABLET | Freq: Once | ORAL | Status: AC
  Administered 2016-12-21: 100 mg via ORAL
  Filled 2016-12-21: qty 2

## 2016-12-21 MED ORDER — PROMETHAZINE HCL 12.5 MG PO TABS
12.5000 mg | ORAL_TABLET | Freq: Once | ORAL | Status: AC
  Administered 2016-12-21: 12.5 mg via ORAL
  Filled 2016-12-21: qty 1

## 2016-12-21 MED ORDER — TRAMADOL HCL 50 MG PO TABS: 50.0000 mg | ORAL_TABLET | Freq: Four times a day (QID) | ORAL | 0 refills | Status: DC | PRN

## 2016-12-21 NOTE — ED Triage Notes (Signed)
Patient complains of left hip pain for past month. Patient denies injury and does not know how it started.

## 2016-12-21 NOTE — Discharge Instructions (Signed)
Heating pad to your hip area may be helpful. Please see Dr. Aline Brochure, or the orthopedic specialist of your choice as soon as possible for evaluation of your back and your hip area.Use flexeril and ultram for pain.

## 2016-12-21 NOTE — ED Provider Notes (Signed)
[facility] DEPT Provider Note   CSN: [id number] Arrival date & time: 12/21/16  1320     History   Chief Complaint Chief Complaint  Patient presents with  . Hip Pain    HPI Rita [name] is a 50 y.o. female.  The history is provided by the patient.  Hip Pain  This is a chronic problem. The problem has been gradually worsening. Pertinent negatives include no chest pain, no abdominal pain and no shortness of breath. Exacerbated by: supine position, and certain posistions. Nothing relieves the symptoms. She has tried nothing for the symptoms.    Past Medical History:  Diagnosis Date  . Barrett's esophagus with esophagitis 03/26/2013  . Chronic abdominal pain   . Chronic back pain   . GERD (gastroesophageal reflux disease)   . Hiatal hernia   . Pancreatitis   . Peptic ulcer   . Tobacco use 03/26/2013    Patient Active Problem List   Diagnosis Date Noted  . Abdominal pain   . Pancreatitis 02/06/2015  . Acute pancreatitis   . Elevated LFTs   . Pancreatitis, acute 05/09/2013  . Hypokalemia 03/27/2013  . Anemia 03/27/2013  . Chronic back pain 03/26/2013  . GERD (gastroesophageal reflux disease) 03/26/2013  . Tobacco use 03/26/2013  . Barrett's esophagus with esophagitis 03/26/2013  . Elevated lipase 08/20/2011    Past Surgical History:  Procedure Laterality Date  . CESAREAN SECTION    . CHOLECYSTECTOMY    . ESOPHAGOGASTRODUODENOSCOPY  Oct 2011   Dr. Laural Golden: ulcer in distal esophagus, soft stricture at GE junction s/p balloon dilation PATH: BARRETT'S  . ESOPHAGOGASTRODUODENOSCOPY (EGD) WITH ESOPHAGEAL DILATION N/A 03/27/2013   Procedure: ESOPHAGOGASTRODUODENOSCOPY (EGD) WITH ESOPHAGEAL DILATION;  Surgeon: Daneil Dolin, MD;  Location: [facility];  Service: Endoscopy;  Laterality: N/A;  possible dilation  . EUS N/A 05/09/2013   Procedure: UPPER ENDOSCOPIC ULTRASOUND (EUS) LINEAR;  Surgeon: Milus Banister, MD;  Location: [facility];  Service: Endoscopy;   Laterality: N/A;  . TUBAL LIGATION      OB History    Gravida Para Term Preterm AB Living   4 3 3   1 3    SAB TAB Ectopic Multiple Live Births   1               Home Medications    Prior to Admission medications   Medication Sig Start Date End Date Taking? Authorizing Provider  calcium carbonate (TUMS - DOSED IN MG ELEMENTAL CALCIUM) 500 MG chewable tablet Chew 1 tablet by mouth daily as needed for indigestion or heartburn.    Historical Provider, MD  ondansetron (ZOFRAN) 4 MG tablet Take 1 tablet (4 mg total) by mouth every 6 (six) hours as needed for nausea or vomiting. 11/18/16   Julianne Rice, MD  pantoprazole (PROTONIX) 40 MG tablet Take 1 tablet (40 mg total) by mouth daily. 11/18/16   Julianne Rice, MD  sucralfate (CARAFATE) 1 GM/10ML suspension Take 10 mLs (1 g total) by mouth 4 (four) times daily -  with meals and at bedtime. 11/18/16   Julianne Rice, MD    Family History Family History  Problem Relation Age of Onset  . Asthma Mother   . Heart failure Mother   . Cancer Mother   . Diabetes Mother   . Hypertension Mother   . Stroke Mother   . Pancreatic cancer Mother     deceased  . Heart failure Father   . Diabetes Father   . Colon cancer Neg Hx  Social History Social History  Substance Use Topics  . Smoking status: Current Every Day Smoker    Packs/day: 0.50    Years: 20.00    Types: Cigarettes  . Smokeless tobacco: Never Used  . Alcohol use No     Allergies   Patient has no active allergies.   Review of Systems Review of Systems  Constitutional: Negative for activity change.       All ROS Neg except as noted in HPI  HENT: Negative for nosebleeds.   Eyes: Negative for photophobia and discharge.  Respiratory: Negative for cough, shortness of breath and wheezing.   Cardiovascular: Negative for chest pain and palpitations.  Gastrointestinal: Negative for abdominal pain and blood in stool.  Genitourinary: Negative for dysuria, frequency  and hematuria.  Musculoskeletal: Positive for arthralgias and back pain. Negative for neck pain.  Skin: Negative.   Neurological: Negative for dizziness, seizures and speech difficulty.  Psychiatric/Behavioral: Negative for confusion and hallucinations.     Physical Exam Updated Vital Signs BP 134/85 (BP Location: Left Arm)   Pulse 81   Temp 97 F (36.1 C) (Oral)   Resp 17   Ht 5\' 6"  (1.676 m)   Wt 81.6 kg   LMP 02/09/2012   SpO2 98%   BMI 29.05 kg/m   Physical Exam  Constitutional: She is oriented to person, place, and time. She appears well-developed and well-nourished.  Non-toxic appearance.  HENT:  Head: Normocephalic.  Right Ear: Tympanic membrane and external ear normal.  Left Ear: Tympanic membrane and external ear normal.  Eyes: EOM and lids are normal. Pupils are equal, round, and reactive to light.  Neck: Normal range of motion. Neck supple. Carotid bruit is not present.  Cardiovascular: Normal rate, regular rhythm, normal heart sounds, intact distal pulses and normal pulses.   Pulmonary/Chest: Breath sounds normal. No respiratory distress.  Abdominal: Soft. Bowel sounds are normal. There is no tenderness. There is no guarding.  Musculoskeletal:       Left hip: She exhibits decreased range of motion. She exhibits no deformity.       Lumbar back: She exhibits tenderness.       Legs: Lymphadenopathy:       Head (right side): No submandibular adenopathy present.       Head (left side): No submandibular adenopathy present.    She has no cervical adenopathy.  Neurological: She is alert and oriented to person, place, and time. She has normal strength. No cranial nerve deficit or sensory deficit.  Skin: Skin is warm and dry.  Psychiatric: She has a normal mood and affect. Her speech is normal.  Nursing note and vitals reviewed.    ED Treatments / Results  Labs (all labs ordered are listed, but only abnormal results are displayed) Labs Reviewed - No data to  display  EKG  EKG Interpretation None       Radiology Dg Hip Unilat W Or Wo Pelvis 2-3 Views Left  Result Date: 12/21/2016 CLINICAL DATA:  Left hip pain. EXAM: DG HIP (WITH OR WITHOUT PELVIS) 2-3V LEFT COMPARISON:  KUB February 05, 2015 FINDINGS: A sclerotic focus over the lateral left superior pubic ramus is unchanged since the comparison, of doubtful acute significance. The pelvic bones are otherwise normal. The left hip is normal with no fractures, dislocations, or significant degenerative changes. IMPRESSION: Negative. Electronically Signed   By: Dorise Bullion III M.D   On: 12/21/2016 15:49    Procedures Procedures (including critical care time)  Medications Ordered in  ED Medications - No data to display   Initial Impression / Assessment and Plan / ED Course  I have reviewed the triage vital signs and the nursing notes.  Pertinent labs & imaging results that were available during my care of the patient were reviewed by me and considered in my medical decision making (see chart for details).  Clinical Course     **I have reviewed nursing notes, vital signs, and all appropriate lab and imaging results for this patient.*  Final Clinical Impressions(s) / ED Diagnoses  Vital signs within normal limits. X-ray of the left hip show some mild degenerative changes, but no dislocations, no fracture on. There no gross neurologic deficits appreciated on examination. The patient was advised of the findings. It was suggested to her that she see the orthopedic specialist for additional evaluation concerning her hip. I also discussed with her that this could be related to her back with referred pain. The patient is advised to rest her hip and back is much as possible, to use a heating pad to the area. Prescription for Ultram and Flexeril given to the patient.    Final diagnoses:  Left hip pain    New Prescriptions New Prescriptions   No medications on file     Lily Kocher,  PA-C 12/21/16 [place], MD 12/22/16 0030

## 2016-12-21 NOTE — ED Notes (Signed)
Pt returned from xray

## 2016-12-30 ENCOUNTER — Inpatient Hospital Stay (HOSPITAL_COMMUNITY)
Admission: EM | Admit: 2016-12-30 | Discharge: 2017-01-02 | DRG: 439 | Disposition: A | Discharge status: Home / Self Care | Payer: Self-pay | Attending: Family Medicine | Admitting: Family Medicine

## 2016-12-30 ENCOUNTER — Emergency Department (HOSPITAL_COMMUNITY): Payer: Self-pay

## 2016-12-30 ENCOUNTER — Encounter (HOSPITAL_COMMUNITY): Payer: Self-pay | Admitting: Emergency Medicine

## 2016-12-30 DIAGNOSIS — R1010 Upper abdominal pain, unspecified: Secondary | ICD-10-CM

## 2016-12-30 DIAGNOSIS — K209 Barrett's esophagus without dysplasia: Secondary | ICD-10-CM | POA: Diagnosis present

## 2016-12-30 DIAGNOSIS — Z23 Encounter for immunization: Secondary | ICD-10-CM

## 2016-12-30 DIAGNOSIS — R1314 Dysphagia, pharyngoesophageal phase: Secondary | ICD-10-CM | POA: Diagnosis present

## 2016-12-30 DIAGNOSIS — K921 Melena: Secondary | ICD-10-CM | POA: Diagnosis present

## 2016-12-30 DIAGNOSIS — K838 Other specified diseases of biliary tract: Secondary | ICD-10-CM | POA: Diagnosis present

## 2016-12-30 DIAGNOSIS — K859 Acute pancreatitis without necrosis or infection, unspecified: Principal | ICD-10-CM | POA: Diagnosis present

## 2016-12-30 DIAGNOSIS — G8929 Other chronic pain: Secondary | ICD-10-CM | POA: Diagnosis present

## 2016-12-30 DIAGNOSIS — R748 Abnormal levels of other serum enzymes: Secondary | ICD-10-CM

## 2016-12-30 DIAGNOSIS — Z8711 Personal history of peptic ulcer disease: Secondary | ICD-10-CM

## 2016-12-30 DIAGNOSIS — F1290 Cannabis use, unspecified, uncomplicated: Secondary | ICD-10-CM | POA: Diagnosis present

## 2016-12-30 DIAGNOSIS — K210 Gastro-esophageal reflux disease with esophagitis: Secondary | ICD-10-CM | POA: Diagnosis present

## 2016-12-30 DIAGNOSIS — K2210 Ulcer of esophagus without bleeding: Secondary | ICD-10-CM | POA: Diagnosis present

## 2016-12-30 DIAGNOSIS — F17210 Nicotine dependence, cigarettes, uncomplicated: Secondary | ICD-10-CM | POA: Diagnosis present

## 2016-12-30 DIAGNOSIS — Z9049 Acquired absence of other specified parts of digestive tract: Secondary | ICD-10-CM

## 2016-12-30 DIAGNOSIS — E876 Hypokalemia: Secondary | ICD-10-CM | POA: Diagnosis present

## 2016-12-30 DIAGNOSIS — K449 Diaphragmatic hernia without obstruction or gangrene: Secondary | ICD-10-CM

## 2016-12-30 DIAGNOSIS — Z800 Family history of malignant neoplasm of digestive organs: Secondary | ICD-10-CM

## 2016-12-30 DIAGNOSIS — K8050 Calculus of bile duct without cholangitis or cholecystitis without obstruction: Secondary | ICD-10-CM

## 2016-12-30 DIAGNOSIS — K2270 Barrett's esophagus without dysplasia: Secondary | ICD-10-CM | POA: Diagnosis present

## 2016-12-30 DIAGNOSIS — R109 Unspecified abdominal pain: Secondary | ICD-10-CM

## 2016-12-30 LAB — CBC
HCT: 46.7 % — ABNORMAL HIGH (ref 36.0–46.0)
Hemoglobin: 16.2 g/dL — ABNORMAL HIGH (ref 12.0–15.0)
MCH: 30.5 pg (ref 26.0–34.0)
MCHC: 34.7 g/dL (ref 30.0–36.0)
MCV: 87.8 fL (ref 78.0–100.0)
PLATELETS: 390 10*3/uL (ref 150–400)
RBC: 5.32 MIL/uL — AB (ref 3.87–5.11)
RDW: 13.5 % (ref 11.5–15.5)
WBC: 10.4 10*3/uL (ref 4.0–10.5)

## 2016-12-30 LAB — HEPATIC FUNCTION PANEL
ALBUMIN: 4.4 g/dL (ref 3.5–5.0)
ALT: 34 U/L (ref 14–54)
AST: 45 U/L — AB (ref 15–41)
Alkaline Phosphatase: 87 U/L (ref 38–126)
Bilirubin, Direct: 0.1 mg/dL (ref 0.1–0.5)
Indirect Bilirubin: 0.5 mg/dL (ref 0.3–0.9)
TOTAL PROTEIN: 8 g/dL (ref 6.5–8.1)
Total Bilirubin: 0.6 mg/dL (ref 0.3–1.2)

## 2016-12-30 LAB — BASIC METABOLIC PANEL
Anion gap: 9 (ref 5–15)
BUN: 9 mg/dL (ref 6–20)
CALCIUM: 9.1 mg/dL (ref 8.9–10.3)
CHLORIDE: 102 mmol/L (ref 101–111)
CO2: 25 mmol/L (ref 22–32)
CREATININE: 0.89 mg/dL (ref 0.44–1.00)
Glucose, Bld: 116 mg/dL — ABNORMAL HIGH (ref 65–99)
Potassium: 2.7 mmol/L — CL (ref 3.5–5.1)
SODIUM: 136 mmol/L (ref 135–145)

## 2016-12-30 LAB — LIPASE, BLOOD: Lipase: 375 U/L — ABNORMAL HIGH (ref 11–51)

## 2016-12-30 LAB — TROPONIN I

## 2016-12-30 MED ORDER — SUCRALFATE 1 GM/10ML PO SUSP
1.0000 g | Freq: Three times a day (TID) | ORAL | Status: DC
  Administered 2016-12-31 – 2017-01-01 (×8): 1 g via ORAL
  Filled 2016-12-30 (×8): qty 10

## 2016-12-30 MED ORDER — IOPAMIDOL (ISOVUE-300) INJECTION 61%
INTRAVENOUS | Status: AC
  Administered 2016-12-30: 19:00:00
  Filled 2016-12-30: qty 30

## 2016-12-30 MED ORDER — POTASSIUM CHLORIDE 10 MEQ/100ML IV SOLN
10.0000 meq | INTRAVENOUS | Status: AC
  Administered 2016-12-31: 10 meq via INTRAVENOUS

## 2016-12-30 MED ORDER — ONDANSETRON HCL 4 MG/2ML IJ SOLN
4.0000 mg | Freq: Four times a day (QID) | INTRAMUSCULAR | Status: DC | PRN
  Administered 2016-12-31 – 2017-01-02 (×6): 4 mg via INTRAVENOUS
  Filled 2016-12-30 (×5): qty 2

## 2016-12-30 MED ORDER — ONDANSETRON HCL 4 MG PO TABS
4.0000 mg | ORAL_TABLET | Freq: Four times a day (QID) | ORAL | Status: DC | PRN
  Filled 2016-12-30: qty 1

## 2016-12-30 MED ORDER — SODIUM CHLORIDE 0.9 % IV BOLUS (SEPSIS)
1000.0000 mL | Freq: Once | INTRAVENOUS | Status: AC
  Administered 2016-12-30: 1000 mL via INTRAVENOUS

## 2016-12-30 MED ORDER — HYDROMORPHONE HCL 1 MG/ML IJ SOLN
1.0000 mg | Freq: Once | INTRAMUSCULAR | Status: AC
  Administered 2016-12-30: 1 mg via INTRAVENOUS
  Filled 2016-12-30: qty 1

## 2016-12-30 MED ORDER — POTASSIUM CHLORIDE 10 MEQ/100ML IV SOLN
10.0000 meq | Freq: Once | INTRAVENOUS | Status: AC
  Administered 2016-12-30: 10 meq via INTRAVENOUS
  Filled 2016-12-30: qty 100

## 2016-12-30 MED ORDER — POTASSIUM CHLORIDE 20 MEQ PO PACK
20.0000 meq | PACK | Freq: Every day | ORAL | Status: DC
  Administered 2016-12-31 – 2017-01-02 (×3): 20 meq via ORAL
  Filled 2016-12-30 (×3): qty 1

## 2016-12-30 MED ORDER — INFLUENZA VAC SPLIT QUAD 0.5 ML IM SUSY
0.5000 mL | PREFILLED_SYRINGE | INTRAMUSCULAR | Status: AC
  Administered 2016-12-31: 0.5 mL via INTRAMUSCULAR
  Filled 2016-12-30: qty 0.5

## 2016-12-30 MED ORDER — KCL IN DEXTROSE-NACL 20-5-0.9 MEQ/L-%-% IV SOLN
INTRAVENOUS | Status: DC
  Administered 2016-12-31 – 2017-01-02 (×6): via INTRAVENOUS

## 2016-12-30 MED ORDER — ONDANSETRON HCL 4 MG/2ML IJ SOLN
4.0000 mg | Freq: Once | INTRAMUSCULAR | Status: AC
  Administered 2016-12-30: 4 mg via INTRAVENOUS
  Filled 2016-12-30: qty 2

## 2016-12-30 MED ORDER — ENOXAPARIN SODIUM 40 MG/0.4ML ~~LOC~~ SOLN
40.0000 mg | SUBCUTANEOUS | Status: DC
  Administered 2016-12-31: 40 mg via SUBCUTANEOUS
  Filled 2016-12-30: qty 0.4

## 2016-12-30 MED ORDER — HYDROMORPHONE HCL 1 MG/ML IJ SOLN
1.0000 mg | INTRAMUSCULAR | Status: DC | PRN
  Administered 2016-12-31 (×4): 1 mg via INTRAVENOUS
  Filled 2016-12-30 (×4): qty 1

## 2016-12-30 MED ORDER — HYDROMORPHONE HCL 1 MG/ML IJ SOLN
1.0000 mg | Freq: Once | INTRAMUSCULAR | Status: AC
  Administered 2016-12-30: 1 mg via INTRAVENOUS
  Filled 2016-12-30 (×2): qty 1

## 2016-12-30 MED ORDER — POTASSIUM CHLORIDE 10 MEQ/100ML IV SOLN
INTRAVENOUS | Status: AC
  Administered 2016-12-31: 10 meq
  Filled 2016-12-30: qty 100

## 2016-12-30 MED ORDER — PANTOPRAZOLE SODIUM 40 MG PO TBEC
40.0000 mg | DELAYED_RELEASE_TABLET | Freq: Every day | ORAL | Status: DC
  Administered 2016-12-31: 40 mg via ORAL
  Filled 2016-12-30: qty 1

## 2016-12-30 NOTE — ED Triage Notes (Signed)
Patient states history of pancreatitis. States abdominal pain that radiates to chest and neck.

## 2016-12-30 NOTE — H&P (Signed)
History and Physical    Rita [name] [id number] DOB: Sep 09, 1966 DOA: 12/30/2016  PCP: [facility]  Patient coming from: Home.    Chief Complaint:  Abdominal pain since "Thanksgiving"  HPI: Rita [name] is an 51 y.o. female with hx of tobacco abuse, Barretts with prior esophageal dilatation (Dr Sydell Axon), chronic back pain, hx of hiatal hernia, presented to the ER with daily abdominal pain for several months, with nausea and diarrhea.  She had no black or bloody stool.  She does not have a PCP due to lack of insurance, and denied alcohol use, but admitted to [facility] use and tobacco use.  Evaluation in the ER showed Lipase of 375, and abd pelvic CT showed no definite evidence of pancreatitis. She has no leukocytosis, and had normal renal Fx, but her K was low at 2.7 mm/L. She was given several rounds of dilaudiid, one run of KCL IV, and hospitalist was asked to admit her for further tx of acute pancreatitis and hypokalemia.   ED Course:  See above.   Past Medical History:  Diagnosis Date  . Barrett's esophagus with esophagitis 03/26/2013  . Chronic abdominal pain   . Chronic back pain   . GERD (gastroesophageal reflux disease)   . Hiatal hernia   . Pancreatitis   . Peptic ulcer   . Tobacco use 03/26/2013    Rewiew of Systems:  Constitutional: Negative for malaise, fever and chills. No significant weight loss or weight gain Eyes: Negative for eye pain, redness and discharge, diplopia, visual changes, or flashes of light. ENMT: Negative for ear pain, hoarseness, nasal congestion, sinus pressure and sore throat. No headaches; tinnitus, drooling, or problem swallowing. Cardiovascular: Negative for chest pain, palpitations, diaphoresis, dyspnea and peripheral edema. ; No orthopnea, PND Respiratory: Negative for cough, hemoptysis, wheezing and stridor. No pleuritic chestpain. Gastrointestinal: Negative for constipation, melena, blood in stool, hematemesis, jaundice and rectal  bleeding.    Genitourinary: Negative for frequency, dysuria, incontinence,flank pain and hematuria; Musculoskeletal: Negative for back pain and neck pain. Negative for swelling and trauma.;  Skin: . Negative for pruritus, rash, abrasions, bruising and skin lesion.; ulcerations Neuro: Negative for headache, lightheadedness and neck stiffness. Negative for weakness, altered level of consciousness , altered mental status, extremity weakness, burning feet, involuntary movement, seizure and syncope.  Psych: negative for anxiety, depression, insomnia, tearfulness, panic attacks, hallucinations, paranoia, suicidal or homicidal ideation   Past Surgical History:  Procedure Laterality Date  . CESAREAN SECTION    . CHOLECYSTECTOMY    . ESOPHAGOGASTRODUODENOSCOPY  Oct 2011   Dr. Laural Golden: ulcer in distal esophagus, soft stricture at GE junction s/p balloon dilation PATH: BARRETT'S  . ESOPHAGOGASTRODUODENOSCOPY (EGD) WITH ESOPHAGEAL DILATION N/A 03/27/2013   Procedure: ESOPHAGOGASTRODUODENOSCOPY (EGD) WITH ESOPHAGEAL DILATION;  Surgeon: Daneil Dolin, MD;  Location: [facility];  Service: Endoscopy;  Laterality: N/A;  possible dilation  . EUS N/A 05/09/2013   Procedure: UPPER ENDOSCOPIC ULTRASOUND (EUS) LINEAR;  Surgeon: Milus Banister, MD;  Location: [facility];  Service: Endoscopy;  Laterality: N/A;  . TUBAL LIGATION       reports that she has been smoking Cigarettes.  She has a 10.00 pack-year smoking history. She has never used smokeless tobacco. She reports that she does not drink alcohol or use drugs.  No Known Allergies  Family History  Problem Relation Age of Onset  . Asthma Mother   . Heart failure Mother   . Cancer Mother   . Diabetes Mother   .  Hypertension Mother   . Stroke Mother   . Pancreatic cancer Mother     deceased  . Heart failure Father   . Diabetes Father   . Colon cancer Neg Hx      Prior to Admission medications   Medication Sig Start Date End Date Taking?  Authorizing Provider  calcium carbonate (TUMS - DOSED IN MG ELEMENTAL CALCIUM) 500 MG chewable tablet Chew 1 tablet by mouth daily as needed for indigestion or heartburn.    Historical Provider, MD  cyclobenzaprine (FLEXERIL) 10 MG tablet Take 1 tablet (10 mg total) by mouth 3 (three) times daily. 12/21/16   Lily Kocher, PA-C  ondansetron (ZOFRAN) 4 MG tablet Take 1 tablet (4 mg total) by mouth every 6 (six) hours as needed for nausea or vomiting. 11/18/16   Julianne Rice, MD  pantoprazole (PROTONIX) 40 MG tablet Take 1 tablet (40 mg total) by mouth daily. 11/18/16   Julianne Rice, MD  sucralfate (CARAFATE) 1 GM/10ML suspension Take 10 mLs (1 g total) by mouth 4 (four) times daily -  with meals and at bedtime. 11/18/16   Julianne Rice, MD  traMADol (ULTRAM) 50 MG tablet Take 1 tablet (50 mg total) by mouth every 6 (six) hours as needed. 12/21/16   Lily Kocher, PA-C    Physical Exam: Vitals:   12/30/16 1454 12/30/16 1843 12/30/16 1845 12/30/16 2000  BP:  131/80 131/80 133/84  Pulse:  73 77 75  Resp:   18 15  Temp:      TempSrc:      SpO2:  100% 97% 98%  Weight: 81.6 kg (180 lb)     Height: 5\' 7"  (1.702 m)         Constitutional: NAD, calm, comfortable Vitals:   12/30/16 1454 12/30/16 1843 12/30/16 1845 12/30/16 2000  BP:  131/80 131/80 133/84  Pulse:  73 77 75  Resp:   18 15  Temp:      TempSrc:      SpO2:  100% 97% 98%  Weight: 81.6 kg (180 lb)     Height: 5\' 7"  (1.702 m)      Eyes: PERRL, lids and conjunctivae normal ENMT: Mucous membranes are moist. Posterior pharynx clear of any exudate or lesions.Normal dentition.  Neck: normal, supple, no masses, no thyromegaly Respiratory: clear to auscultation bilaterally, no wheezing, no crackles. Normal respiratory effort. No accessory muscle use.  Cardiovascular: Regular rate and rhythm, no murmurs / rubs / gallops. No extremity edema. 2+ pedal pulses. No carotid bruits.  Abdomen: no tenderness, no masses palpated. No  hepatosplenomegaly. Bowel sounds positive.  Musculoskeletal: no clubbing / cyanosis. No joint deformity upper and lower extremities. Good ROM, no contractures. Normal muscle tone.  Skin: no rashes, lesions, ulcers. No induration Neurologic: CN 2-12 grossly intact. Sensation intact, DTR normal. Strength 5/5 in all 4.  Psychiatric: Normal judgment and insight. Alert and oriented x 3. Normal mood.     Labs on Admission: I have personally reviewed following labs and imaging studies  CBC:  Recent Labs Lab 12/30/16 1535  WBC 10.4  HGB 16.2*  HCT 46.7*  MCV 87.8  PLT [id number]   Basic Metabolic Panel:  Recent Labs Lab 12/30/16 1535  NA 136  K 2.7*  CL 102  CO2 25  GLUCOSE 116*  BUN 9  CREATININE 0.89  CALCIUM 9.1   GFR: Estimated Creatinine Clearance: 82.2 mL/min (by C-G formula based on SCr of 0.89 mg/dL). Liver Function Tests:  Recent Labs Lab 12/30/16 1535  AST 45*  ALT 34  ALKPHOS 87  BILITOT 0.6  PROT 8.0  ALBUMIN 4.4    Recent Labs Lab 12/30/16 1535  LIPASE 375*    Recent Labs Lab 12/30/16 1535  TROPONINI <0.03   Urine analysis:    Component Value Date/Time   COLORURINE YELLOW 11/18/2016 1032   APPEARANCEUR CLEAR 11/18/2016 1032   LABSPEC 1.015 11/18/2016 1032   PHURINE 7.5 11/18/2016 1032   GLUCOSEU NEGATIVE 11/18/2016 1032   HGBUR TRACE (A) 11/18/2016 1032   BILIRUBINUR NEGATIVE 11/18/2016 1032   KETONESUR TRACE (A) 11/18/2016 1032   PROTEINUR 100 (A) 11/18/2016 1032   UROBILINOGEN 0.2 02/05/2015 2347   NITRITE NEGATIVE 11/18/2016 1032   LEUKOCYTESUR NEGATIVE 11/18/2016 1032    Radiological Exams on Admission: Dg Chest 2 View  Result Date: 12/30/2016 CLINICAL DATA:  History pancreatitis. Right-sided pain extends to chest. Personal history of pancreatitis and Barrett's esophagus. EXAM: CHEST  2 VIEW COMPARISON:  None. FINDINGS: The heart size is normal. Lungs are clear. There is no edema or effusion to suggest failure. IMPRESSION: No active  cardiopulmonary disease. Electronically Signed   By: San Morelle M.D.   On: 12/30/2016 15:41   Ct Abdomen Pelvis W Contrast  Result Date: 12/30/2016 CLINICAL DATA:  51 year old female with a history of abdominal pain that radiates to the chest and neck. History of pancreatitis. EXAM: CT ABDOMEN AND PELVIS WITH CONTRAST TECHNIQUE: Multidetector CT imaging of the abdomen and pelvis was performed using the standard protocol following bolus administration of intravenous contrast. CONTRAST:  1 ISOVUE-300 IOPAMIDOL (ISOVUE-300) INJECTION 61% COMPARISON:  CT the abdomen and pelvis 03/07/2014. FINDINGS: Lower chest: Small hiatal hernia. Circumferential thickening of the distal esophagus. Hepatobiliary: Diffuse low attenuation throughout the hepatic parenchyma, compatible with a background of hepatic steatosis. Subcentimeter low-attenuation lesion in the inferior aspect of the right lobe of the liver is too small to definitively characterize, but is similar to prior study 03/07/2014, likely tiny cysts. No intrahepatic biliary ductal dilatation. Status post cholecystectomy. Common bile duct is mildly dilated measuring 10 mm in the porta hepatis, likely reflective of benign post cholecystectomy physiology. Pancreas: No pancreatic mass. No pancreatic ductal dilatation. No pancreatic or peripancreatic fluid or inflammatory changes. Spleen: Unremarkable. Adrenals/Urinary Tract: 2 cm simple cyst in the interpolar region of the left kidney. Other subcentimeter low-attenuation lesions in the kidneys bilaterally are too small to definitively characterize, but are statistically likely tiny cysts. Bilateral adrenal glands are normal in appearance. No hydroureteronephrosis. Urinary bladder is normal in appearance. Stomach/Bowel: The appearance of the stomach is normal. There is no pathologic dilatation of small bowel or colon. Normal appendix. Vascular/Lymphatic: Aortic atherosclerosis, without evidence of aneurysm or  dissection in the abdominal or pelvic vasculature. No lymphadenopathy noted in the abdomen or pelvis. Reproductive: Uterus and ovaries are unremarkable in appearance. Other: No significant volume of ascites.  No pneumoperitoneum. Musculoskeletal: There are no aggressive appearing lytic or blastic lesions noted in the visualized portions of the skeleton. IMPRESSION: 1. No acute findings in the abdomen or pelvis to account for the patient's symptoms. 2. Small hiatal hernia with profound circumferential thickening of the distal esophagus. This may simply reflect reflux esophagitis, however, correlation with follow-up nonemergent endoscopy is suggested in the near future to exclude the possibility of Barrett's metaplasia or neoplasia. 3. Normal appendix. 4. Hepatic steatosis. 5. Aortic atherosclerosis. 6. Additional incidental findings, as above. Electronically Signed   By: Vinnie Langton M.D.   On: 12/30/2016 20:57    EKG: Independently reviewed.  Assessment/Plan Principal Problem:   Pancreatitis, acute Active Problems:   Barrett's esophagus with esophagitis   Hypokalemia   Acute pancreatitis    PLAN:   Acute pancreatitis:  Unclear etiology.  Will give bowel rest, IVF, and IV narcotics.  Will repeat lipase tomorrow.  Consult GI for further recommendation to see if further testing is warranted.  Will check lipid profile.  She is not on any medication that is known to cause pancreatitis.   Hypokalemia:  Check Mag, and replete K.  Barretts with dysphagia:  GI consulted.  Consider EGD, but will defer to GI.   DVT prophylaxis: SUBQ Heparin.  Code Status: FULL CODE.  Family Communication: boyfriend at bedside with her permission.  Disposition Plan: to home when appropriate.  Consults called: None. Admission status: OBS.    Kayceon Oki MD FACP. [facility]  If 7PM-7AM, please contact night-coverage [email or web address] Password [facility]  12/30/2016, 10:37 PM

## 2016-12-30 NOTE — ED Provider Notes (Signed)
[facility] DEPT Provider Note   CSN: [id number] Arrival date & time: 12/30/16  1406     History   Chief Complaint Chief Complaint  Patient presents with  . Abdominal Pain  . Chest Pain    HPI Rita [name] is a 51 y.o. female.  Patient complains of gout pain nausea vomiting.   The history is provided by the patient. No language interpreter was used.  Abdominal Pain   This is a recurrent problem. The problem occurs constantly. The problem has not changed since onset.The pain is associated with an unknown factor. The pain is located in the epigastric region. Pertinent negatives include diarrhea, frequency, hematuria and headaches.  Chest Pain   Associated symptoms include abdominal pain. Pertinent negatives include no back pain, no cough and no headaches.  Pertinent negatives for past medical history include no seizures.    Past Medical History:  Diagnosis Date  . Barrett's esophagus with esophagitis 03/26/2013  . Chronic abdominal pain   . Chronic back pain   . GERD (gastroesophageal reflux disease)   . Hiatal hernia   . Pancreatitis   . Peptic ulcer   . Tobacco use 03/26/2013    Patient Active Problem List   Diagnosis Date Noted  . Abdominal pain   . Pancreatitis 02/06/2015  . Acute pancreatitis   . Elevated LFTs   . Pancreatitis, acute 05/09/2013  . Hypokalemia 03/27/2013  . Anemia 03/27/2013  . Chronic back pain 03/26/2013  . GERD (gastroesophageal reflux disease) 03/26/2013  . Tobacco use 03/26/2013  . Barrett's esophagus with esophagitis 03/26/2013  . Elevated lipase 08/20/2011    Past Surgical History:  Procedure Laterality Date  . CESAREAN SECTION    . CHOLECYSTECTOMY    . ESOPHAGOGASTRODUODENOSCOPY  Oct 2011   Dr. Laural Golden: ulcer in distal esophagus, soft stricture at GE junction s/p balloon dilation PATH: BARRETT'S  . ESOPHAGOGASTRODUODENOSCOPY (EGD) WITH ESOPHAGEAL DILATION N/A 03/27/2013   Procedure: ESOPHAGOGASTRODUODENOSCOPY (EGD) WITH  ESOPHAGEAL DILATION;  Surgeon: Daneil Dolin, MD;  Location: [facility];  Service: Endoscopy;  Laterality: N/A;  possible dilation  . EUS N/A 05/09/2013   Procedure: UPPER ENDOSCOPIC ULTRASOUND (EUS) LINEAR;  Surgeon: Milus Banister, MD;  Location: [facility];  Service: Endoscopy;  Laterality: N/A;  . TUBAL LIGATION      OB History    Gravida Para Term Preterm AB Living   4 3 3   1 3    SAB TAB Ectopic Multiple Live Births   1               Home Medications    Prior to Admission medications   Medication Sig Start Date End Date Taking? Authorizing Provider  calcium carbonate (TUMS - DOSED IN MG ELEMENTAL CALCIUM) 500 MG chewable tablet Chew 1 tablet by mouth daily as needed for indigestion or heartburn.    Historical Provider, MD  cyclobenzaprine (FLEXERIL) 10 MG tablet Take 1 tablet (10 mg total) by mouth 3 (three) times daily. 12/21/16   Lily Kocher, PA-C  ondansetron (ZOFRAN) 4 MG tablet Take 1 tablet (4 mg total) by mouth every 6 (six) hours as needed for nausea or vomiting. 11/18/16   Julianne Rice, MD  pantoprazole (PROTONIX) 40 MG tablet Take 1 tablet (40 mg total) by mouth daily. 11/18/16   Julianne Rice, MD  sucralfate (CARAFATE) 1 GM/10ML suspension Take 10 mLs (1 g total) by mouth 4 (four) times daily -  with meals and at bedtime. 11/18/16   Julianne Rice, MD  traMADol (ULTRAM) 50 MG tablet Take 1 tablet (50 mg total) by mouth every 6 (six) hours as needed. 12/21/16   Lily Kocher, PA-C    Family History Family History  Problem Relation Age of Onset  . Asthma Mother   . Heart failure Mother   . Cancer Mother   . Diabetes Mother   . Hypertension Mother   . Stroke Mother   . Pancreatic cancer Mother     deceased  . Heart failure Father   . Diabetes Father   . Colon cancer Neg Hx     Social History Social History  Substance Use Topics  . Smoking status: Current Every Day Smoker    Packs/day: 0.50    Years: 20.00    Types: Cigarettes  . Smokeless  tobacco: Never Used  . Alcohol use No     Allergies   Patient has no active allergies.   Review of Systems Review of Systems  Constitutional: Negative for appetite change and fatigue.  HENT: Negative for congestion, ear discharge and sinus pressure.   Eyes: Negative for discharge.  Respiratory: Negative for cough.   Cardiovascular: Positive for chest pain.  Gastrointestinal: Positive for abdominal pain. Negative for diarrhea.  Genitourinary: Negative for frequency and hematuria.  Musculoskeletal: Negative for back pain.  Skin: Negative for rash.  Neurological: Negative for seizures and headaches.  Psychiatric/Behavioral: Negative for hallucinations.     Physical Exam Updated Vital Signs BP 133/84   Pulse 75   Temp 97.7 F (36.5 C) (Oral)   Resp 15   Ht 5\' 7"  (1.702 m)   Wt 180 lb (81.6 kg)   LMP 02/09/2012   SpO2 98%   BMI 28.19 kg/m   Physical Exam  Constitutional: She is oriented to person, place, and time. She appears well-developed.  HENT:  Head: Normocephalic.  Eyes: Conjunctivae and EOM are normal. No scleral icterus.  Neck: Neck supple. No thyromegaly present.  Cardiovascular: Normal rate and regular rhythm.  Exam reveals no gallop and no friction rub.   No murmur heard. Pulmonary/Chest: No stridor. She has no wheezes. She has no rales. She exhibits no tenderness.  Abdominal: She exhibits no distension. There is tenderness. There is no rebound.  Musculoskeletal: Normal range of motion. She exhibits no edema.  Lymphadenopathy:    She has no cervical adenopathy.  Neurological: She is oriented to person, place, and time. She exhibits normal muscle tone. Coordination normal.  Skin: No rash noted. No erythema.  Psychiatric: She has a normal mood and affect. Her behavior is normal.     ED Treatments / Results  Labs (all labs ordered are listed, but only abnormal results are displayed) Labs Reviewed  BASIC METABOLIC PANEL - Abnormal; Notable for the  following:       Result Value   Potassium 2.7 (*)    Glucose, Bld 116 (*)    All other components within normal limits  CBC - Abnormal; Notable for the following:    RBC 5.32 (*)    Hemoglobin 16.2 (*)    HCT 46.7 (*)    All other components within normal limits  HEPATIC FUNCTION PANEL - Abnormal; Notable for the following:    AST 45 (*)    All other components within normal limits  LIPASE, BLOOD - Abnormal; Notable for the following:    Lipase 375 (*)    All other components within normal limits  TROPONIN I    EKG  EKG Interpretation  Date/Time:  Friday December 30 2016 15:03:01 EST Ventricular Rate:  75 PR Interval:  146 QRS Duration: 160 QT Interval:  450 QTC Calculation: 502 R Axis:   18 Text Interpretation:  Normal sinus rhythm Right bundle branch block Abnormal ECG Confirmed by Sintia Mckissic  MD, Demarrius Guerrero [phone]) on 12/30/2016 6:59:17 PM       Radiology Dg Chest 2 View  Result Date: 12/30/2016 CLINICAL DATA:  History pancreatitis. Right-sided pain extends to chest. Personal history of pancreatitis and Barrett's esophagus. EXAM: CHEST  2 VIEW COMPARISON:  None. FINDINGS: The heart size is normal. Lungs are clear. There is no edema or effusion to suggest failure. IMPRESSION: No active cardiopulmonary disease. Electronically Signed   By: San Morelle M.D.   On: 12/30/2016 15:41   Ct Abdomen Pelvis W Contrast  Result Date: 12/30/2016 CLINICAL DATA:  51 year old female with a history of abdominal pain that radiates to the chest and neck. History of pancreatitis. EXAM: CT ABDOMEN AND PELVIS WITH CONTRAST TECHNIQUE: Multidetector CT imaging of the abdomen and pelvis was performed using the standard protocol following bolus administration of intravenous contrast. CONTRAST:  1 ISOVUE-300 IOPAMIDOL (ISOVUE-300) INJECTION 61% COMPARISON:  CT the abdomen and pelvis 03/07/2014. FINDINGS: Lower chest: Small hiatal hernia. Circumferential thickening of the distal esophagus. Hepatobiliary:  Diffuse low attenuation throughout the hepatic parenchyma, compatible with a background of hepatic steatosis. Subcentimeter low-attenuation lesion in the inferior aspect of the right lobe of the liver is too small to definitively characterize, but is similar to prior study 03/07/2014, likely tiny cysts. No intrahepatic biliary ductal dilatation. Status post cholecystectomy. Common bile duct is mildly dilated measuring 10 mm in the porta hepatis, likely reflective of benign post cholecystectomy physiology. Pancreas: No pancreatic mass. No pancreatic ductal dilatation. No pancreatic or peripancreatic fluid or inflammatory changes. Spleen: Unremarkable. Adrenals/Urinary Tract: 2 cm simple cyst in the interpolar region of the left kidney. Other subcentimeter low-attenuation lesions in the kidneys bilaterally are too small to definitively characterize, but are statistically likely tiny cysts. Bilateral adrenal glands are normal in appearance. No hydroureteronephrosis. Urinary bladder is normal in appearance. Stomach/Bowel: The appearance of the stomach is normal. There is no pathologic dilatation of small bowel or colon. Normal appendix. Vascular/Lymphatic: Aortic atherosclerosis, without evidence of aneurysm or dissection in the abdominal or pelvic vasculature. No lymphadenopathy noted in the abdomen or pelvis. Reproductive: Uterus and ovaries are unremarkable in appearance. Other: No significant volume of ascites.  No pneumoperitoneum. Musculoskeletal: There are no aggressive appearing lytic or blastic lesions noted in the visualized portions of the skeleton. IMPRESSION: 1. No acute findings in the abdomen or pelvis to account for the patient's symptoms. 2. Small hiatal hernia with profound circumferential thickening of the distal esophagus. This may simply reflect reflux esophagitis, however, correlation with follow-up nonemergent endoscopy is suggested in the near future to exclude the possibility of Barrett's  metaplasia or neoplasia. 3. Normal appendix. 4. Hepatic steatosis. 5. Aortic atherosclerosis. 6. Additional incidental findings, as above. Electronically Signed   By: Vinnie Langton M.D.   On: 12/30/2016 20:57    Procedures Procedures (including critical care time)  Medications Ordered in ED Medications  potassium chloride 10 mEq in 100 mL IVPB (10 mEq Intravenous New Bag/Given 12/30/16 2138)  sodium chloride 0.9 % bolus 1,000 mL (0 mLs Intravenous Stopped 12/30/16 2030)  HYDROmorphone (DILAUDID) injection 1 mg (1 mg Intravenous Given 12/30/16 1923)  ondansetron (ZOFRAN) injection 4 mg (4 mg Intravenous Given 12/30/16 1923)  iopamidol (ISOVUE-300) 61 % injection (  Contrast Given 12/30/16 1915)  HYDROmorphone (DILAUDID) injection 1 mg (1 mg Intravenous Given 12/30/16 2138)  ondansetron [facility]) injection 4 mg (4 mg Intravenous Given 12/30/16 2138)     Initial Impression / Assessment and Plan / ED Course  I have reviewed the triage vital signs and the nursing notes.  Pertinent labs & imaging results that were available during my care of the patient were reviewed by me and considered in my medical decision making (see chart for details).  Clinical Course     Patient had elevated lipase. Patient's abdominal pain is consistent with pancreatitis and possible peptic ulcer problems. Patient will be admitted  Final Clinical Impressions(s) / ED Diagnoses   Final diagnoses:  Pain of upper abdomen    New Prescriptions New Prescriptions   No medications on file     Milton Ferguson, MD 12/30/16 2220

## 2016-12-31 ENCOUNTER — Observation Stay (HOSPITAL_COMMUNITY): Payer: Self-pay

## 2016-12-31 DIAGNOSIS — K859 Acute pancreatitis without necrosis or infection, unspecified: Principal | ICD-10-CM

## 2016-12-31 DIAGNOSIS — R748 Abnormal levels of other serum enzymes: Secondary | ICD-10-CM

## 2016-12-31 DIAGNOSIS — K2270 Barrett's esophagus without dysplasia: Secondary | ICD-10-CM

## 2016-12-31 DIAGNOSIS — R109 Unspecified abdominal pain: Secondary | ICD-10-CM

## 2016-12-31 DIAGNOSIS — E876 Hypokalemia: Secondary | ICD-10-CM

## 2016-12-31 DIAGNOSIS — G8929 Other chronic pain: Secondary | ICD-10-CM

## 2016-12-31 DIAGNOSIS — K209 Esophagitis, unspecified: Secondary | ICD-10-CM

## 2016-12-31 LAB — COMPREHENSIVE METABOLIC PANEL
ALBUMIN: 3.5 g/dL (ref 3.5–5.0)
ALT: 95 U/L — ABNORMAL HIGH (ref 14–54)
ANION GAP: 7 (ref 5–15)
AST: 136 U/L — ABNORMAL HIGH (ref 15–41)
Alkaline Phosphatase: 136 U/L — ABNORMAL HIGH (ref 38–126)
BILIRUBIN TOTAL: 0.7 mg/dL (ref 0.3–1.2)
BUN: 7 mg/dL (ref 6–20)
CO2: 24 mmol/L (ref 22–32)
Calcium: 8.2 mg/dL — ABNORMAL LOW (ref 8.9–10.3)
Chloride: 108 mmol/L (ref 101–111)
Creatinine, Ser: 0.81 mg/dL (ref 0.44–1.00)
GFR calc Af Amer: >60 mL/min (ref >60)
GFR calc non Af Amer: >60 mL/min (ref >60)
GLUCOSE: 103 mg/dL — AB (ref 65–99)
POTASSIUM: 2.9 mmol/L — AB (ref 3.5–5.1)
Sodium: 139 mmol/L (ref 135–145)
TOTAL PROTEIN: 6.2 g/dL — AB (ref 6.5–8.1)

## 2016-12-31 LAB — CBC
HEMATOCRIT: 40.4 % (ref 36.0–46.0)
HEMOGLOBIN: 13.7 g/dL (ref 12.0–15.0)
MCH: 30.1 pg (ref 26.0–34.0)
MCHC: 33.9 g/dL (ref 30.0–36.0)
MCV: 88.8 fL (ref 78.0–100.0)
Platelets: 356 10*3/uL (ref 150–400)
RBC: 4.55 MIL/uL (ref 3.87–5.11)
RDW: 13.6 % (ref 11.5–15.5)
WBC: 7.7 10*3/uL (ref 4.0–10.5)

## 2016-12-31 LAB — LIPASE, BLOOD: Lipase: 218 U/L — ABNORMAL HIGH (ref 11–51)

## 2016-12-31 LAB — MAGNESIUM: MAGNESIUM: 1.9 mg/dL (ref 1.7–2.4)

## 2016-12-31 MED ORDER — POTASSIUM CHLORIDE 10 MEQ/100ML IV SOLN
10.0000 meq | INTRAVENOUS | Status: AC
  Administered 2016-12-31 (×4): 10 meq via INTRAVENOUS
  Filled 2016-12-31 (×3): qty 100

## 2016-12-31 MED ORDER — ALUM & MAG HYDROXIDE-SIMETH 200-200-20 MG/5ML PO SUSP
30.0000 mL | Freq: Four times a day (QID) | ORAL | Status: DC | PRN
  Administered 2016-12-31 – 2017-01-01 (×3): 30 mL via ORAL
  Filled 2016-12-31 (×3): qty 30

## 2016-12-31 MED ORDER — HYDROMORPHONE HCL 1 MG/ML IJ SOLN
0.5000 mg | INTRAMUSCULAR | Status: DC | PRN
  Administered 2016-12-31 – 2017-01-02 (×15): 0.5 mg via INTRAVENOUS
  Filled 2016-12-31 (×16): qty 1

## 2016-12-31 MED ORDER — PANTOPRAZOLE SODIUM 40 MG PO TBEC
40.0000 mg | DELAYED_RELEASE_TABLET | Freq: Two times a day (BID) | ORAL | Status: DC
  Administered 2016-12-31 – 2017-01-02 (×4): 40 mg via ORAL
  Filled 2016-12-31 (×4): qty 1

## 2016-12-31 NOTE — Progress Notes (Signed)
PROGRESS NOTE    Rita [name]  [id number]  DOB: 1966/06/13  DOA: 12/30/2016 PCP: [facility]:   Hospital course: Rita [name] is an 51 y.o. female with hx of tobacco abuse, Barretts with prior esophageal dilatation (Dr Sydell Axon), chronic back pain, hx of hiatal hernia, presented to the ER with daily abdominal pain for several months, with nausea and diarrhea.  She had no black or bloody stool.  She does not have a PCP due to lack of insurance, and denied alcohol use, but admitted to [facility] use and tobacco use.  Evaluation in the ER showed Lipase of 375, and abd pelvic CT showed no definite evidence of pancreatitis. She has no leukocytosis, and had normal renal Fx, but her K was low at 2.7 mm/L. She was given several rounds of dilaudiid, one run of KCL IV, and hospitalist was asked to admit her for further tx of acute pancreatitis and hypokalemia.   Assessment & Plan:   Mild acute pancreatitis - improving, lipase trending down, try clears diet today, pain control, IVFs, lipid panel pending.   Hypokalemia - check magnesium, Add runs of KCL to IVFs. Barretts esophagus with esophagitis - continue PPI, GI consult pending.   DVT prophylaxis: heparin Code Status: full  Family Communication: patient Disposition Plan: TBD  Consultants:  GI   Subjective: Pt says she is slightly improved this morning.  Wants to try clears.    Objective: Vitals:   12/30/16 2313 12/31/16 0134 12/31/16 0647 12/31/16 0648  BP: 116/76 (!) 107/59 (!) 89/50 91/60  Pulse: 68 72 69   Resp: 13 20 18    Temp:  98 F (36.7 C) 98.2 F (36.8 C)   TempSrc:  Oral Oral   SpO2: 96% 98% 97%   Weight:  84.3 kg (185 lb 14.4 oz)    Height:        Intake/Output Summary (Last 24 hours) at 12/31/16 1214 Last data filed at 12/31/16 0303  Gross per 24 hour  Intake           477.08 ml  Output                0 ml  Net           477.08 ml   Filed Weights   12/30/16 1454 12/31/16 0134    Weight: 81.6 kg (180 lb) 84.3 kg (185 lb 14.4 oz)    Exam:  General exam: awake, alert, no distress, cooperative.  Respiratory system: Clear. No increased work of breathing. Cardiovascular system: S1 & S2 heard, RRR. No JVD, murmurs, gallops, clicks or pedal edema. Gastrointestinal system: Abdomen is nondistended, soft and tender epigastric area. Normal bowel sounds heard. Central nervous system: Alert and oriented. No focal neurological deficits. Extremities: no CCE.  Data Reviewed: Basic Metabolic Panel:  Recent Labs Lab 12/30/16 1535 12/31/16 0604  NA 136 139  K 2.7* 2.9*  CL 102 108  CO2 25 24  GLUCOSE 116* 103*  BUN 9 7  CREATININE 0.89 0.81  CALCIUM 9.1 8.2*  MG  --  1.9   Liver Function Tests:  Recent Labs Lab 12/30/16 1535 12/31/16 0604  AST 45* 136*  ALT 34 95*  ALKPHOS 87 136*  BILITOT 0.6 0.7  PROT 8.0 6.2*  ALBUMIN 4.4 3.5    Recent Labs Lab 12/30/16 1535 12/31/16 0604  LIPASE 375* 218*   No results for input(s): AMMONIA in the last 168 hours. CBC:  Recent Labs Lab 12/30/16 1535  12/31/16 0604  WBC 10.4 7.7  HGB 16.2* 13.7  HCT 46.7* 40.4  MCV 87.8 88.8  PLT 390 356   Cardiac Enzymes:  Recent Labs Lab 12/30/16 1535  TROPONINI <0.03   CBG (last 3)  No results for input(s): GLUCAP in the last 72 hours. No results found for this or any previous visit (from the past 240 hour(s)).   Studies: Dg Chest 2 View  Result Date: 12/30/2016 CLINICAL DATA:  History pancreatitis. Right-sided pain extends to chest. Personal history of pancreatitis and Barrett's esophagus. EXAM: CHEST  2 VIEW COMPARISON:  None. FINDINGS: The heart size is normal. Lungs are clear. There is no edema or effusion to suggest failure. IMPRESSION: No active cardiopulmonary disease. Electronically Signed   By: San Morelle M.D.   On: 12/30/2016 15:41   US Abdomen Complete  Result Date: 12/31/2016 CLINICAL DATA:  Mid abdominal pain for 3 days. Elevated liver  enzymes. EXAM: ABDOMEN ULTRASOUND COMPLETE COMPARISON:  CT, 12/30/2016 FINDINGS: Gallbladder: Surgically absent Common bile duct: Diameter: 8.8 mm Liver: Increased parenchymal echogenicity. Mildly heterogeneous parenchymal echogenicity. No mass or focal lesion. IVC: No abnormality visualized. Pancreas: Obscured by midline bowel gas. Spleen: Size and appearance within normal limits. Right Kidney: Length: 10.6 cm. Echogenicity within normal limits. No mass or hydronephrosis visualized. Left Kidney: Length: 12.0 cm. Normal parenchymal echogenicity. Small cysts evident at the midpole on CT is not visualized sonographically. No sonographically visible renal masses. No stones. No hydronephrosis. Abdominal aorta: Limited visualization.  No aneurysm seen. Other findings: None. IMPRESSION: 1. No acute findings. 2. Status post cholecystectomy with mild chronic dilation of common bile duct. 3. Hepatic steatosis. 4. Pancreas not visualized due to midline gas. Electronically Signed   By: Lajean Manes M.D.   On: 12/31/2016 11:04   Ct Abdomen Pelvis W Contrast  Result Date: 12/30/2016 CLINICAL DATA:  51 year old female with a history of abdominal pain that radiates to the chest and neck. History of pancreatitis. EXAM: CT ABDOMEN AND PELVIS WITH CONTRAST TECHNIQUE: Multidetector CT imaging of the abdomen and pelvis was performed using the standard protocol following bolus administration of intravenous contrast. CONTRAST:  1 ISOVUE-300 IOPAMIDOL (ISOVUE-300) INJECTION 61% COMPARISON:  CT the abdomen and pelvis 03/07/2014. FINDINGS: Lower chest: Small hiatal hernia. Circumferential thickening of the distal esophagus. Hepatobiliary: Diffuse low attenuation throughout the hepatic parenchyma, compatible with a background of hepatic steatosis. Subcentimeter low-attenuation lesion in the inferior aspect of the right lobe of the liver is too small to definitively characterize, but is similar to prior study 03/07/2014, likely tiny  cysts. No intrahepatic biliary ductal dilatation. Status post cholecystectomy. Common bile duct is mildly dilated measuring 10 mm in the porta hepatis, likely reflective of benign post cholecystectomy physiology. Pancreas: No pancreatic mass. No pancreatic ductal dilatation. No pancreatic or peripancreatic fluid or inflammatory changes. Spleen: Unremarkable. Adrenals/Urinary Tract: 2 cm simple cyst in the interpolar region of the left kidney. Other subcentimeter low-attenuation lesions in the kidneys bilaterally are too small to definitively characterize, but are statistically likely tiny cysts. Bilateral adrenal glands are normal in appearance. No hydroureteronephrosis. Urinary bladder is normal in appearance. Stomach/Bowel: The appearance of the stomach is normal. There is no pathologic dilatation of small bowel or colon. Normal appendix. Vascular/Lymphatic: Aortic atherosclerosis, without evidence of aneurysm or dissection in the abdominal or pelvic vasculature. No lymphadenopathy noted in the abdomen or pelvis. Reproductive: Uterus and ovaries are unremarkable in appearance. Other: No significant volume of ascites.  No pneumoperitoneum. Musculoskeletal: There are no aggressive appearing lytic  or blastic lesions noted in the visualized portions of the skeleton. IMPRESSION: 1. No acute findings in the abdomen or pelvis to account for the patient's symptoms. 2. Small hiatal hernia with profound circumferential thickening of the distal esophagus. This may simply reflect reflux esophagitis, however, correlation with follow-up nonemergent endoscopy is suggested in the near future to exclude the possibility of Barrett's metaplasia or neoplasia. 3. Normal appendix. 4. Hepatic steatosis. 5. Aortic atherosclerosis. 6. Additional incidental findings, as above. Electronically Signed   By: Vinnie Langton M.D.   On: 12/30/2016 20:57   Scheduled Meds: . enoxaparin (LOVENOX) injection  40 mg Subcutaneous Q24H  .  pantoprazole  40 mg Oral BID  . potassium chloride  20 mEq Oral Daily  . potassium chloride  10 mEq Intravenous Q1 Hr x 4  . sucralfate  1 g Oral TID WC & HS   Continuous Infusions: . dextrose 5 % and 0.9 % NaCl with KCl 20 mEq/L 125 mL/hr at 12/31/16 0002   Principal Problem:   Pancreatitis, acute Active Problems:   Barrett's esophagus with esophagitis   Hypokalemia   Acute pancreatitis  Time spent:   Irwin Brakeman, MD, FAAFP [facility] Pager [phone]  If 7PM-7AM, please contact night-coverage [email or web address] Password [email or web address] 12/31/2016, 12:14 PM    LOS: 0 days

## 2016-12-31 NOTE — Consult Note (Signed)
Referring Provider: No ref. provider found Primary Care Physician:  Renwick He Primary Gastroenterologist:  Dr. Gala Romney  Reason for Consultation:  "Pancreatitis".   HPI: Pleasant 51 year old lady with no health insurance admitted to the hospital with recurrent epigastric/right upper quadrant abdominal pain nausea and vomiting. Relative bump in aminotransferases and lipase noted in association with acute illness. Ultrasound and CT demonstrates a mildly dilated common bile duct without intrahepatic biliary dilation. Thickened distal esophageal wall. The pancreas and peripancreatic structures appear normal. Patient does not drink alcohol. Denies opioid therapy. Her gallbladder removed in the distant past. Long-standing complicated GERD on no acid suppression therapy on a regular basis. History of peptic stricture requiring multiple dilations in the past.  Currently has reflux symptoms every day and recurrent esophageal dysphagia. Also, relates intermittent paper hematochezia. No prior colonoscopy.  Has had similar upper GI symptoms as outlined above for years. She states tempo has been increasing. Prior workup has included an EUS in 2014 demonstrating a normal pancreas and biliary tree  - status post cholecystectomy. Stomach was full of liquid food debris at that time.  Esophageal biopsies previously demonstrated Barrett's esophagus but not on subsequent biopsy.   Past Medical History:  Diagnosis Date  . Barrett's esophagus with esophagitis 03/26/2013  . Chronic abdominal pain   . Chronic back pain   . GERD (gastroesophageal reflux disease)   . Hiatal hernia   . Pancreatitis   . Peptic ulcer   . Tobacco use 03/26/2013    Past Surgical History:  Procedure Laterality Date  . CESAREAN SECTION    . CHOLECYSTECTOMY    . ESOPHAGOGASTRODUODENOSCOPY  Oct 2011   Dr. Laural Golden: ulcer in distal esophagus, soft stricture at GE junction s/p balloon dilation PATH: BARRETT'S  .  ESOPHAGOGASTRODUODENOSCOPY (EGD) WITH ESOPHAGEAL DILATION N/A 03/27/2013   Procedure: ESOPHAGOGASTRODUODENOSCOPY (EGD) WITH ESOPHAGEAL DILATION;  Surgeon: Daneil Dolin, MD;  Location: [facility];  Service: Endoscopy;  Laterality: N/A;  possible dilation  . EUS N/A 05/09/2013   Procedure: UPPER ENDOSCOPIC ULTRASOUND (EUS) LINEAR;  Surgeon: Milus Banister, MD;  Location: [facility];  Service: Endoscopy;  Laterality: N/A;  . TUBAL LIGATION      Prior to Admission medications   Medication Sig Start Date End Date Taking? Authorizing Provider  Cal Carb-Mag Hydrox-Simeth (ROLAIDS ADVANCED) 1000-200-40 MG CHEW Chew 1-2 each by mouth daily as needed (for heart burn).   Yes Historical Provider, MD  calcium carbonate (TUMS - DOSED IN MG ELEMENTAL CALCIUM) 500 MG chewable tablet Chew 1 tablet by mouth daily as needed for indigestion or heartburn.   Yes Historical Provider, MD  cyclobenzaprine (FLEXERIL) 10 MG tablet Take 1 tablet (10 mg total) by mouth 3 (three) times daily. 12/21/16  Yes Lily Kocher, PA-C  traMADol (ULTRAM) 50 MG tablet Take 1 tablet (50 mg total) by mouth every 6 (six) hours as needed. 12/21/16  Yes Lily Kocher, PA-C    Current Facility-Administered Medications  Medication Dose Route Frequency Provider Last Rate Last Dose  . alum & mag hydroxide-simeth (MAALOX/MYLANTA) 200-200-20 MG/5ML suspension 30 mL  30 mL Oral Q6H PRN Orvan Falconer, MD   30 mL at 12/31/16 0753  . dextrose 5 % and 0.9 % NaCl with KCl 20 mEq/L infusion   Intravenous Continuous Orvan Falconer, MD 125 mL/hr at 12/31/16 0002    . enoxaparin (LOVENOX) injection 40 mg  40 mg Subcutaneous Q24H Orvan Falconer, MD      . HYDROmorphone (DILAUDID) injection 0.5 mg  0.5 mg Intravenous Q3H PRN Clanford Marisa Hua, MD      . ondansetron [facility]) tablet 4 mg  4 mg Oral Q6H PRN Orvan Falconer, MD       Or  . ondansetron [facility]) injection 4 mg  4 mg Intravenous Q6H PRN Orvan Falconer, MD   4 mg at 12/31/16 0617  . pantoprazole (PROTONIX) EC tablet 40  mg  40 mg Oral BID Clanford L Johnson, MD      . potassium chloride (KLOR-CON) packet 20 mEq  20 mEq Oral Daily Orvan Falconer, MD   20 mEq at 12/31/16 0923  . potassium chloride 10 mEq in 100 mL IVPB  10 mEq Intravenous Q1 Hr x 4 Clanford L Johnson, MD   10 mEq at 12/31/16 1053  . sucralfate (CARAFATE) 1 GM/10ML suspension 1 g  1 g Oral TID WC & HS Orvan Falconer, MD   1 g at 12/31/16 0753    Allergies as of 12/30/2016  . (No Known Allergies)    Family History  Problem Relation Age of Onset  . Asthma Mother   . Heart failure Mother   . Cancer Mother   . Diabetes Mother   . Hypertension Mother   . Stroke Mother   . Pancreatic cancer Mother     deceased  . Heart failure Father   . Diabetes Father   . Colon cancer Neg Hx     Social History   Social History  . Marital status: Single    Spouse name: N/A  . Number of children: N/A  . Years of education: N/A   Occupational History  . farm    Social History Main Topics  . Smoking status: Current Every Day Smoker    Packs/day: 1.00    Years: 20.00    Types: Cigarettes  . Smokeless tobacco: Never Used  . Alcohol use No  . Drug use: No  . Sexual activity: Not on file   Other Topics Concern  . Not on file   Social History Narrative  . No narrative on file    Review of Systems:  As in history of present illness  Physical Exam: Vital signs in last 24 hours: Temp:  [97.7 F (36.5 C)-98.2 F (36.8 C)] 98.2 F (36.8 C) (01/06 0647) Pulse Rate:  [68-77] 69 (01/06 0647) Resp:  [13-20] 18 (01/06 0647) BP: (89-141)/(50-92) 91/60 (01/06 0648) SpO2:  [96 %-100 %] 97 % (01/06 0647) Weight:  [180 lb (81.6 kg)-185 lb 14.4 oz (84.3 kg)] 185 lb 14.4 oz (84.3 kg) (01/06 0134)   General:   Alert, pleasant and cooperative in NAD Eyes:  Sclera clear, no icterus.   Conjunctiva pink. Lungs:  Clear throughout to auscultation.   No wheezes, crackles, or rhonchi. No acute distress. Heart:  Regular rate and rhythm; no murmurs, clicks, rubs,   or gallops. Abdomen:  Nondistended. Positive bowel sounds. She does have epigastric and right upper quadrant tenderness to palpation. No appreciable mass or organomegaly   Intake/Output from previous day: 01/05 0701 - 01/06 0700 In: 477.1 [I.V.:377.1; IV Piggyback:100] Out: -  Intake/Output this shift: No intake/output data recorded.  Lab Results:  Recent Labs  12/30/16 1535 12/31/16 0604  WBC 10.4 7.7  HGB 16.2* 13.7  HCT 46.7* 40.4  PLT 390 356   BMET  Recent Labs  12/30/16 1535 12/31/16 0604  NA 136 139  K 2.7* 2.9*  CL 102 108  CO2 25 24  GLUCOSE 116* 103*  BUN 9 7  CREATININE 0.89 0.81  CALCIUM 9.1 8.2*   LFT  Recent Labs  12/30/16 1535 12/31/16 0604  PROT 8.0 6.2*  ALBUMIN 4.4 3.5  AST 45* 136*  ALT 34 95*  ALKPHOS 87 136*  BILITOT 0.6 0.7  BILIDIR 0.1  --   IBILI 0.5  --    Studies/Results: Dg Chest 2 View  Result Date: 12/30/2016 CLINICAL DATA:  History pancreatitis. Right-sided pain extends to chest. Personal history of pancreatitis and Barrett's esophagus. EXAM: CHEST  2 VIEW COMPARISON:  None. FINDINGS: The heart size is normal. Lungs are clear. There is no edema or effusion to suggest failure. IMPRESSION: No active cardiopulmonary disease. Electronically Signed   By: San Morelle M.D.   On: 12/30/2016 15:41   US Abdomen Complete  Result Date: 12/31/2016 CLINICAL DATA:  Mid abdominal pain for 3 days. Elevated liver enzymes. EXAM: ABDOMEN ULTRASOUND COMPLETE COMPARISON:  CT, 12/30/2016 FINDINGS: Gallbladder: Surgically absent Common bile duct: Diameter: 8.8 mm Liver: Increased parenchymal echogenicity. Mildly heterogeneous parenchymal echogenicity. No mass or focal lesion. IVC: No abnormality visualized. Pancreas: Obscured by midline bowel gas. Spleen: Size and appearance within normal limits. Right Kidney: Length: 10.6 cm. Echogenicity within normal limits. No mass or hydronephrosis visualized. Left Kidney: Length: 12.0 cm. Normal parenchymal  echogenicity. Small cysts evident at the midpole on CT is not visualized sonographically. No sonographically visible renal masses. No stones. No hydronephrosis. Abdominal aorta: Limited visualization.  No aneurysm seen. Other findings: None. IMPRESSION: 1. No acute findings. 2. Status post cholecystectomy with mild chronic dilation of common bile duct. 3. Hepatic steatosis. 4. Pancreas not visualized due to midline gas. Electronically Signed   By: Lajean Manes M.D.   On: 12/31/2016 11:04   Ct Abdomen Pelvis W Contrast  Result Date: 12/30/2016 CLINICAL DATA:  51 year old female with a history of abdominal pain that radiates to the chest and neck. History of pancreatitis. EXAM: CT ABDOMEN AND PELVIS WITH CONTRAST TECHNIQUE: Multidetector CT imaging of the abdomen and pelvis was performed using the standard protocol following bolus administration of intravenous contrast. CONTRAST:  1 ISOVUE-300 IOPAMIDOL (ISOVUE-300) INJECTION 61% COMPARISON:  CT the abdomen and pelvis 03/07/2014. FINDINGS: Lower chest: Small hiatal hernia. Circumferential thickening of the distal esophagus. Hepatobiliary: Diffuse low attenuation throughout the hepatic parenchyma, compatible with a background of hepatic steatosis. Subcentimeter low-attenuation lesion in the inferior aspect of the right lobe of the liver is too small to definitively characterize, but is similar to prior study 03/07/2014, likely tiny cysts. No intrahepatic biliary ductal dilatation. Status post cholecystectomy. Common bile duct is mildly dilated measuring 10 mm in the porta hepatis, likely reflective of benign post cholecystectomy physiology. Pancreas: No pancreatic mass. No pancreatic ductal dilatation. No pancreatic or peripancreatic fluid or inflammatory changes. Spleen: Unremarkable. Adrenals/Urinary Tract: 2 cm simple cyst in the interpolar region of the left kidney. Other subcentimeter low-attenuation lesions in the kidneys bilaterally are too small to  definitively characterize, but are statistically likely tiny cysts. Bilateral adrenal glands are normal in appearance. No hydroureteronephrosis. Urinary bladder is normal in appearance. Stomach/Bowel: The appearance of the stomach is normal. There is no pathologic dilatation of small bowel or colon. Normal appendix. Vascular/Lymphatic: Aortic atherosclerosis, without evidence of aneurysm or dissection in the abdominal or pelvic vasculature. No lymphadenopathy noted in the abdomen or pelvis. Reproductive: Uterus and ovaries are unremarkable in appearance. Other: No significant volume of ascites.  No pneumoperitoneum. Musculoskeletal: There are no aggressive appearing lytic or blastic lesions noted in the visualized portions of the skeleton.  IMPRESSION: 1. No acute findings in the abdomen or pelvis to account for the patient's symptoms. 2. Small hiatal hernia with profound circumferential thickening of the distal esophagus. This may simply reflect reflux esophagitis, however, correlation with follow-up nonemergent endoscopy is suggested in the near future to exclude the possibility of Barrett's metaplasia or neoplasia. 3. Normal appendix. 4. Hepatic steatosis. 5. Aortic atherosclerosis. 6. Additional incidental findings, as above. Electronically Signed   By: Vinnie Langton M.D.   On: 12/30/2016 20:57    Impression:   Pleasant 50 year old lady with a following GI issues:  #1. Biliary type pain in a patient status post distant cholecystectomy with a relative bump in aminotransferases and serum lipase. Common bile duct mildly dilated on ultrasound and CT scan. No pancreatic parenchymal or per-parenchymal abnormalities to suggestive significant pancreatitis. She has had recurrent symptoms over time.  Need to evaluate further for occult common duct stone although workup negative previously (EUS). I suspect patient may be experiencing symptoms due to SOD dysfunction.  #2.  Long-standing complicated reflux  esophagitis with stricture requiring multiple dilations in the past. She has ongoing daily reflux symptoms and esophageal dysphagia. No regular acid suppression therapy as an outpatient.  Distant esophageal biopsies demonstrated reflux esophagitis and Barrett's epithelium.  #3.  Intermittent hematochezia-no prior colonoscopy  Recommendations:  MRCP to further evaluate for possible occult common bile duct stone. If negative, patient will need to be referred to a tertiary referral center for further evaluation of SOO dysfunction versus empiric sphincterotomy.   Consider adding Levsin sublingual to regimen if MRCP negative for stone disease.  Repeat LFT's in am.  Agree with the twice a day PPI therapy / Carafate for now. EGD with possible esophageal dilation prior to discharge.  Outpatient diagnostic colonoscopy for hematochezia.  Her acute illness is complicated by the fact she has no insurance. I recommend that case management be consulted to assess her candidacy for [facility] assistance.  Further recommendations to follow.        Notice:  This dictation was prepared with Dragon dictation along with smaller phrase technology. Any transcriptional errors that result from this process are unintentional and may not be corrected upon review.

## 2017-01-01 ENCOUNTER — Inpatient Hospital Stay (HOSPITAL_COMMUNITY): Payer: Self-pay

## 2017-01-01 DIAGNOSIS — R1310 Dysphagia, unspecified: Secondary | ICD-10-CM

## 2017-01-01 DIAGNOSIS — K8050 Calculus of bile duct without cholangitis or cholecystitis without obstruction: Secondary | ICD-10-CM

## 2017-01-01 DIAGNOSIS — R1010 Upper abdominal pain, unspecified: Secondary | ICD-10-CM

## 2017-01-01 DIAGNOSIS — K851 Biliary acute pancreatitis without necrosis or infection: Secondary | ICD-10-CM

## 2017-01-01 LAB — LIPID PANEL
CHOL/HDL RATIO: 5.1 ratio
CHOLESTEROL: 162 mg/dL (ref 0–200)
HDL: 32 mg/dL — ABNORMAL LOW (ref >40)
LDL Cholesterol: 98 mg/dL (ref 0–99)
Triglycerides: 159 mg/dL — ABNORMAL HIGH (ref <150)
VLDL: 32 mg/dL (ref 0–40)

## 2017-01-01 LAB — COMPREHENSIVE METABOLIC PANEL
ALK PHOS: 92 U/L (ref 38–126)
ALT: 57 U/L — AB (ref 14–54)
ANION GAP: 6 (ref 5–15)
AST: 41 U/L (ref 15–41)
Albumin: 3.3 g/dL — ABNORMAL LOW (ref 3.5–5.0)
BUN: <5 mg/dL — ABNORMAL LOW (ref 6–20)
CALCIUM: 8.2 mg/dL — AB (ref 8.9–10.3)
CO2: 22 mmol/L (ref 22–32)
CREATININE: 0.77 mg/dL (ref 0.44–1.00)
Chloride: 113 mmol/L — ABNORMAL HIGH (ref 101–111)
Glucose, Bld: 115 mg/dL — ABNORMAL HIGH (ref 65–99)
Potassium: 3.5 mmol/L (ref 3.5–5.1)
SODIUM: 141 mmol/L (ref 135–145)
Total Bilirubin: 0.2 mg/dL — ABNORMAL LOW (ref 0.3–1.2)
Total Protein: 5.7 g/dL — ABNORMAL LOW (ref 6.5–8.1)

## 2017-01-01 LAB — LIPASE, BLOOD: Lipase: 31 U/L (ref 11–51)

## 2017-01-01 MED ORDER — GADOBENATE DIMEGLUMINE 529 MG/ML IV SOLN
15.0000 mL | Freq: Once | INTRAVENOUS | Status: AC | PRN
  Administered 2017-01-01: 15 mL via INTRAVENOUS

## 2017-01-01 MED ORDER — HYOSCYAMINE SULFATE 0.125 MG SL SUBL: 0.1250 mg | SUBLINGUAL_TABLET | Freq: Four times a day (QID) | SUBLINGUAL | Status: DC | PRN

## 2017-01-01 NOTE — Progress Notes (Signed)
PROGRESS NOTE    Rita [name]  [id number]  DOB: 1966/09/16  DOA: 12/30/2016 PCP: [facility]:   Hospital course: Rita [name] is an 51 y.o. female with hx of tobacco abuse, Barretts with prior esophageal dilatation (Dr Sydell Axon), chronic back pain, hx of hiatal hernia, presented to the ER with daily abdominal pain for several months, with nausea and diarrhea.  She had no black or bloody stool.  She does not have a PCP due to lack of insurance, and denied alcohol use, but admitted to [facility] use and tobacco use.  Evaluation in the ER showed Lipase of 375, and abd pelvic CT showed no definite evidence of pancreatitis. She has no leukocytosis, and had normal renal Fx, but her K was low at 2.7 mm/L. She was given several rounds of dilaudiid, one run of KCL IV, and hospitalist was asked to admit her for further tx of acute pancreatitis and hypokalemia.   Assessment & Plan:   Mild acute pancreatitis - improving, lipase down to normal, GI ordered MRCP which is pending, advance diet today to full liquids, pain control, IVFs, lipid panel: TC 162, LDL 98, HDL 32, Trig 159.   Hypokalemia - repleted.  Barretts esophagus with esophagitis - continue PPI, GI considering EGD.   DVT prophylaxis: heparin Code Status: full  Family Communication: patient Disposition Plan: TBD  Consultants:  GI   Subjective: Pt says she is feeling better today.     Objective: Vitals:   12/31/16 0648 12/31/16 1447 12/31/16 2113 01/01/17 0546  BP: 91/60 134/85 110/71 (!) 91/46  Pulse:  63 64 61  Resp:  18 18 18   Temp:  97.8 F (36.6 C) 97.6 F (36.4 C) 97.8 F (36.6 C)  TempSrc:  Oral Oral Oral  SpO2:  99% 100% 100%  Weight:      Height:       No intake or output data in the 24 hours ending 01/01/17 1145 Filed Weights   12/30/16 1454 12/31/16 0134  Weight: 81.6 kg (180 lb) 84.3 kg (185 lb 14.4 oz)    Exam:  General exam: awake, alert, no distress, cooperative.    Respiratory system: Clear. No increased work of breathing. Cardiovascular system: S1 & S2 heard, RRR. No JVD, murmurs, gallops, clicks or pedal edema. Gastrointestinal system: Abdomen is nondistended, soft and tender epigastric area. Normal bowel sounds heard. Central nervous system: Alert and oriented. No focal neurological deficits. Extremities: no CCE.  Data Reviewed: Basic Metabolic Panel:  Recent Labs Lab 12/30/16 1535 12/31/16 0604 01/01/17 0552  NA 136 139 141  K 2.7* 2.9* 3.5  CL 102 108 113*  CO2 25 24 22   GLUCOSE 116* 103* 115*  BUN 9 7 <5*  CREATININE 0.89 0.81 0.77  CALCIUM 9.1 8.2* 8.2*  MG  --  1.9  --    Liver Function Tests:  Recent Labs Lab 12/30/16 1535 12/31/16 0604 01/01/17 0552  AST 45* 136* 41  ALT 34 95* 57*  ALKPHOS 87 136* 92  BILITOT 0.6 0.7 0.2*  PROT 8.0 6.2* 5.7*  ALBUMIN 4.4 3.5 3.3*    Recent Labs Lab 12/30/16 1535 12/31/16 0604 01/01/17 0552  LIPASE 375* 218* 31   No results for input(s): AMMONIA in the last 168 hours. CBC:  Recent Labs Lab 12/30/16 1535 12/31/16 0604  WBC 10.4 7.7  HGB 16.2* 13.7  HCT 46.7* 40.4  MCV 87.8 88.8  PLT 390 356   Cardiac Enzymes:  Recent Labs Lab 12/30/16  [place] <0.03   CBG (last 3)  No results for input(s): GLUCAP in the last 72 hours. No results found for this or any previous visit (from the past 240 hour(s)).   Studies: Dg Chest 2 View  Result Date: 12/30/2016 CLINICAL DATA:  History pancreatitis. Right-sided pain extends to chest. Personal history of pancreatitis and Barrett's esophagus. EXAM: CHEST  2 VIEW COMPARISON:  None. FINDINGS: The heart size is normal. Lungs are clear. There is no edema or effusion to suggest failure. IMPRESSION: No active cardiopulmonary disease. Electronically Signed   By: San Morelle M.D.   On: 12/30/2016 15:41   US Abdomen Complete  Result Date: 12/31/2016 CLINICAL DATA:  Mid abdominal pain for 3 days. Elevated liver enzymes.  EXAM: ABDOMEN ULTRASOUND COMPLETE COMPARISON:  CT, 12/30/2016 FINDINGS: Gallbladder: Surgically absent Common bile duct: Diameter: 8.8 mm Liver: Increased parenchymal echogenicity. Mildly heterogeneous parenchymal echogenicity. No mass or focal lesion. IVC: No abnormality visualized. Pancreas: Obscured by midline bowel gas. Spleen: Size and appearance within normal limits. Right Kidney: Length: 10.6 cm. Echogenicity within normal limits. No mass or hydronephrosis visualized. Left Kidney: Length: 12.0 cm. Normal parenchymal echogenicity. Small cysts evident at the midpole on CT is not visualized sonographically. No sonographically visible renal masses. No stones. No hydronephrosis. Abdominal aorta: Limited visualization.  No aneurysm seen. Other findings: None. IMPRESSION: 1. No acute findings. 2. Status post cholecystectomy with mild chronic dilation of common bile duct. 3. Hepatic steatosis. 4. Pancreas not visualized due to midline gas. Electronically Signed   By: Lajean Manes M.D.   On: 12/31/2016 11:04   Ct Abdomen Pelvis W Contrast  Result Date: 12/30/2016 CLINICAL DATA:  51 year old female with a history of abdominal pain that radiates to the chest and neck. History of pancreatitis. EXAM: CT ABDOMEN AND PELVIS WITH CONTRAST TECHNIQUE: Multidetector CT imaging of the abdomen and pelvis was performed using the standard protocol following bolus administration of intravenous contrast. CONTRAST:  1 ISOVUE-300 IOPAMIDOL (ISOVUE-300) INJECTION 61% COMPARISON:  CT the abdomen and pelvis 03/07/2014. FINDINGS: Lower chest: Small hiatal hernia. Circumferential thickening of the distal esophagus. Hepatobiliary: Diffuse low attenuation throughout the hepatic parenchyma, compatible with a background of hepatic steatosis. Subcentimeter low-attenuation lesion in the inferior aspect of the right lobe of the liver is too small to definitively characterize, but is similar to prior study 03/07/2014, likely tiny cysts. No  intrahepatic biliary ductal dilatation. Status post cholecystectomy. Common bile duct is mildly dilated measuring 10 mm in the porta hepatis, likely reflective of benign post cholecystectomy physiology. Pancreas: No pancreatic mass. No pancreatic ductal dilatation. No pancreatic or peripancreatic fluid or inflammatory changes. Spleen: Unremarkable. Adrenals/Urinary Tract: 2 cm simple cyst in the interpolar region of the left kidney. Other subcentimeter low-attenuation lesions in the kidneys bilaterally are too small to definitively characterize, but are statistically likely tiny cysts. Bilateral adrenal glands are normal in appearance. No hydroureteronephrosis. Urinary bladder is normal in appearance. Stomach/Bowel: The appearance of the stomach is normal. There is no pathologic dilatation of small bowel or colon. Normal appendix. Vascular/Lymphatic: Aortic atherosclerosis, without evidence of aneurysm or dissection in the abdominal or pelvic vasculature. No lymphadenopathy noted in the abdomen or pelvis. Reproductive: Uterus and ovaries are unremarkable in appearance. Other: No significant volume of ascites.  No pneumoperitoneum. Musculoskeletal: There are no aggressive appearing lytic or blastic lesions noted in the visualized portions of the skeleton. IMPRESSION: 1. No acute findings in the abdomen or pelvis to account for the patient's symptoms. 2. Small hiatal hernia  with profound circumferential thickening of the distal esophagus. This may simply reflect reflux esophagitis, however, correlation with follow-up nonemergent endoscopy is suggested in the near future to exclude the possibility of Barrett's metaplasia or neoplasia. 3. Normal appendix. 4. Hepatic steatosis. 5. Aortic atherosclerosis. 6. Additional incidental findings, as above. Electronically Signed   By: Vinnie Langton M.D.   On: 12/30/2016 20:57   Mr 3d Recon At Scanner  Result Date: 01/01/2017 CLINICAL DATA:  51 year old female with history  of abdominal pain for several days. Elevated liver function tests. EXAM: MRI ABDOMEN WITHOUT AND WITH CONTRAST (INCLUDING MRCP) TECHNIQUE: Multiplanar multisequence MR imaging of the abdomen was performed both before and after the administration of intravenous contrast. Heavily T2-weighted images of the biliary and pancreatic ducts were obtained, and three-dimensional MRCP images were rendered by post processing. CONTRAST:  10mL MULTIHANCE GADOBENATE DIMEGLUMINE 529 MG/ML IV SOLN COMPARISON:  No prior MRI of the abdomen. CT the abdomen and pelvis 12/30/2016. FINDINGS: Lower chest: Unremarkable. Hepatobiliary: Mild diffuse loss of signal intensity throughout the hepatic parenchyma on out of phase dual echo images, compatible with a background of mild hepatic steatosis. 1 cm T1 hypointense, T2 mildly hyperintense, nonenhancing lesion in segment 5 of the liver is compatible with a tiny simple cyst. No suspicious hepatic lesions. No intrahepatic biliary ductal dilatation noted on MRCP images. Status post cholecystectomy. Common bile duct measures 8 mm in the porta hepatis, presumably reflective of benign post cholecystectomy physiology. No filling defect within the common bile duct to suggest choledocholithiasis. Pancreas: No pancreatic mass. No pancreatic ductal dilatation on MRCP images. No pancreatic or peripancreatic fluid or inflammatory changes. Spleen:  Unremarkable. Adrenals/Urinary Tract: There are several T1 hypointense, T2 hyperintense, nonenhancing lesions in the kidneys bilaterally, compatible with simple cysts, largest of which measures 2.4 cm in the posterior aspect of the upper pole of the left kidney. No suspicious renal lesions are identified. No hydroureteronephrosis in the visualized abdomen. Bilateral adrenal glands are normal in appearance. Stomach/Bowel: Visualized portions are unremarkable. Vascular/Lymphatic: No aneurysm or dissection identified in the visualized abdominal vasculature. No  lymphadenopathy noted in the abdomen. Other: No significant volume of ascites noted in the visualized portions of the peritoneal cavity. Musculoskeletal: No aggressive osseous lesions are noted in the visualized portions of the skeleton. IMPRESSION: 1. No acute findings. 2. Mild hepatic steatosis. 3. Status post cholecystectomy. Minimal common bile duct dilatation is compatible with benign post cholecystectomy physiology. No evidence of residual choledocholithiasis. 4. Additional incidental findings, as above. Electronically Signed   By: Vinnie Langton M.D.   On: 01/01/2017 11:37   Mr Abdomen Mrcp Moise Boring Contast  Result Date: 01/01/2017 CLINICAL DATA:  51 year old female with history of abdominal pain for several days. Elevated liver function tests. EXAM: MRI ABDOMEN WITHOUT AND WITH CONTRAST (INCLUDING MRCP) TECHNIQUE: Multiplanar multisequence MR imaging of the abdomen was performed both before and after the administration of intravenous contrast. Heavily T2-weighted images of the biliary and pancreatic ducts were obtained, and three-dimensional MRCP images were rendered by post processing. CONTRAST:  91mL MULTIHANCE GADOBENATE DIMEGLUMINE 529 MG/ML IV SOLN COMPARISON:  No prior MRI of the abdomen. CT the abdomen and pelvis 12/30/2016. FINDINGS: Lower chest: Unremarkable. Hepatobiliary: Mild diffuse loss of signal intensity throughout the hepatic parenchyma on out of phase dual echo images, compatible with a background of mild hepatic steatosis. 1 cm T1 hypointense, T2 mildly hyperintense, nonenhancing lesion in segment 5 of the liver is compatible with a tiny simple cyst. No suspicious hepatic lesions. No intrahepatic  biliary ductal dilatation noted on MRCP images. Status post cholecystectomy. Common bile duct measures 8 mm in the porta hepatis, presumably reflective of benign post cholecystectomy physiology. No filling defect within the common bile duct to suggest choledocholithiasis. Pancreas: No pancreatic  mass. No pancreatic ductal dilatation on MRCP images. No pancreatic or peripancreatic fluid or inflammatory changes. Spleen:  Unremarkable. Adrenals/Urinary Tract: There are several T1 hypointense, T2 hyperintense, nonenhancing lesions in the kidneys bilaterally, compatible with simple cysts, largest of which measures 2.4 cm in the posterior aspect of the upper pole of the left kidney. No suspicious renal lesions are identified. No hydroureteronephrosis in the visualized abdomen. Bilateral adrenal glands are normal in appearance. Stomach/Bowel: Visualized portions are unremarkable. Vascular/Lymphatic: No aneurysm or dissection identified in the visualized abdominal vasculature. No lymphadenopathy noted in the abdomen. Other: No significant volume of ascites noted in the visualized portions of the peritoneal cavity. Musculoskeletal: No aggressive osseous lesions are noted in the visualized portions of the skeleton. IMPRESSION: 1. No acute findings. 2. Mild hepatic steatosis. 3. Status post cholecystectomy. Minimal common bile duct dilatation is compatible with benign post cholecystectomy physiology. No evidence of residual choledocholithiasis. 4. Additional incidental findings, as above. Electronically Signed   By: Vinnie Langton M.D.   On: 01/01/2017 11:37   Scheduled Meds: . enoxaparin (LOVENOX) injection  40 mg Subcutaneous Q24H  . pantoprazole  40 mg Oral BID  . potassium chloride  20 mEq Oral Daily  . sucralfate  1 g Oral TID WC & HS   Continuous Infusions: . dextrose 5 % and 0.9 % NaCl with KCl 20 mEq/L 125 mL/hr at 01/01/17 0930   Principal Problem:   Pancreatitis, acute Active Problems:   Barrett's esophagus with esophagitis   Hypokalemia   Acute pancreatitis   Elevated liver enzymes  Time spent:   Irwin Brakeman, MD, FAAFP [facility] Pager [phone]  If 7PM-7AM, please contact night-coverage [email or web address] Password [email or web address] 01/01/2017, 11:45 AM    LOS: 1 day

## 2017-01-01 NOTE — Progress Notes (Signed)
Patient feels much better today. Aminotransferases have dropped significantly. Serum lipase normal.  MRCP reviewed. Mild dilation of common bile duct-expected following a distant cholecystectomy. No evidence of pancreatic or peripancreatic inflammation/abnormality. No common duct stones seen.  Vital signs in last 24 hours: Temp:  [97.6 F (36.4 C)-97.8 F (36.6 C)] 97.8 F (36.6 C) (01/07 0546) Pulse Rate:  [61-64] 61 (01/07 0546) Resp:  [18] 18 (01/07 0546) BP: (91-134)/(46-85) 91/46 (01/07 0546) SpO2:  [99 %-100 %] 100 % (01/07 0546) Last BM Date: 12/31/16 General:   Alert,   pleasant and cooperative in NAD Abdomen:  Nondistended. Positive bowel sounds. Abdomen is essentially soft and nontender without appreciable mass or organomegaly  Extremities:  Without clubbing or edema.    Intake/Output from previous day: No intake/output data recorded. Intake/Output this shift: No intake/output data recorded.  Lab Results:  Recent Labs  12/30/16 1535 12/31/16 0604  WBC 10.4 7.7  HGB 16.2* 13.7  HCT 46.7* 40.4  PLT 390 356   BMET  Recent Labs  12/30/16 1535 12/31/16 0604 01/01/17 0552  NA 136 139 141  K 2.7* 2.9* 3.5  CL 102 108 113*  CO2 25 24 22   GLUCOSE 116* 103* 115*  BUN 9 7 <5*  CREATININE 0.89 0.81 0.77  CALCIUM 9.1 8.2* 8.2*   LFT  Recent Labs  12/30/16 1535  01/01/17 0552  PROT 8.0  < > 5.7*  ALBUMIN 4.4  < > 3.3*  AST 45*  < > 41  ALT 34  < > 57*  ALKPHOS 87  < > 92  BILITOT 0.6  < > 0.2*  BILIDIR 0.1  --   --   IBILI 0.5  --   --   < > = values in this interval not displayed. PT/INR No results for input(s): LABPROT, INR in the last 72 hours. Hepatitis Panel No results for input(s): HEPBSAG, HCVAB, HEPAIGM, HEPBIGM in the last 72 hours. C-Diff No results for input(s): CDIFFTOX in the last 72 hours.  Studies/Results: Dg Chest 2 View  Result Date: 12/30/2016 CLINICAL DATA:  History pancreatitis. Right-sided pain extends to chest. Personal  history of pancreatitis and Barrett's esophagus. EXAM: CHEST  2 VIEW COMPARISON:  None. FINDINGS: The heart size is normal. Lungs are clear. There is no edema or effusion to suggest failure. IMPRESSION: No active cardiopulmonary disease. Electronically Signed   By: San Morelle M.D.   On: 12/30/2016 15:41   US Abdomen Complete  Result Date: 12/31/2016 CLINICAL DATA:  Mid abdominal pain for 3 days. Elevated liver enzymes. EXAM: ABDOMEN ULTRASOUND COMPLETE COMPARISON:  CT, 12/30/2016 FINDINGS: Gallbladder: Surgically absent Common bile duct: Diameter: 8.8 mm Liver: Increased parenchymal echogenicity. Mildly heterogeneous parenchymal echogenicity. No mass or focal lesion. IVC: No abnormality visualized. Pancreas: Obscured by midline bowel gas. Spleen: Size and appearance within normal limits. Right Kidney: Length: 10.6 cm. Echogenicity within normal limits. No mass or hydronephrosis visualized. Left Kidney: Length: 12.0 cm. Normal parenchymal echogenicity. Small cysts evident at the midpole on CT is not visualized sonographically. No sonographically visible renal masses. No stones. No hydronephrosis. Abdominal aorta: Limited visualization.  No aneurysm seen. Other findings: None. IMPRESSION: 1. No acute findings. 2. Status post cholecystectomy with mild chronic dilation of common bile duct. 3. Hepatic steatosis. 4. Pancreas not visualized due to midline gas. Electronically Signed   By: Lajean Manes M.D.   On: 12/31/2016 11:04   Ct Abdomen Pelvis W Contrast  Result Date: 12/30/2016 CLINICAL DATA:  51 year old female with a history  of abdominal pain that radiates to the chest and neck. History of pancreatitis. EXAM: CT ABDOMEN AND PELVIS WITH CONTRAST TECHNIQUE: Multidetector CT imaging of the abdomen and pelvis was performed using the standard protocol following bolus administration of intravenous contrast. CONTRAST:  1 ISOVUE-300 IOPAMIDOL (ISOVUE-300) INJECTION 61% COMPARISON:  CT the abdomen and pelvis  03/07/2014. FINDINGS: Lower chest: Small hiatal hernia. Circumferential thickening of the distal esophagus. Hepatobiliary: Diffuse low attenuation throughout the hepatic parenchyma, compatible with a background of hepatic steatosis. Subcentimeter low-attenuation lesion in the inferior aspect of the right lobe of the liver is too small to definitively characterize, but is similar to prior study 03/07/2014, likely tiny cysts. No intrahepatic biliary ductal dilatation. Status post cholecystectomy. Common bile duct is mildly dilated measuring 10 mm in the porta hepatis, likely reflective of benign post cholecystectomy physiology. Pancreas: No pancreatic mass. No pancreatic ductal dilatation. No pancreatic or peripancreatic fluid or inflammatory changes. Spleen: Unremarkable. Adrenals/Urinary Tract: 2 cm simple cyst in the interpolar region of the left kidney. Other subcentimeter low-attenuation lesions in the kidneys bilaterally are too small to definitively characterize, but are statistically likely tiny cysts. Bilateral adrenal glands are normal in appearance. No hydroureteronephrosis. Urinary bladder is normal in appearance. Stomach/Bowel: The appearance of the stomach is normal. There is no pathologic dilatation of small bowel or colon. Normal appendix. Vascular/Lymphatic: Aortic atherosclerosis, without evidence of aneurysm or dissection in the abdominal or pelvic vasculature. No lymphadenopathy noted in the abdomen or pelvis. Reproductive: Uterus and ovaries are unremarkable in appearance. Other: No significant volume of ascites.  No pneumoperitoneum. Musculoskeletal: There are no aggressive appearing lytic or blastic lesions noted in the visualized portions of the skeleton. IMPRESSION: 1. No acute findings in the abdomen or pelvis to account for the patient's symptoms. 2. Small hiatal hernia with profound circumferential thickening of the distal esophagus. This may simply reflect reflux esophagitis, however,  correlation with follow-up nonemergent endoscopy is suggested in the near future to exclude the possibility of Barrett's metaplasia or neoplasia. 3. Normal appendix. 4. Hepatic steatosis. 5. Aortic atherosclerosis. 6. Additional incidental findings, as above. Electronically Signed   By: Vinnie Langton M.D.   On: 12/30/2016 20:57   Mr 3d Recon At Scanner  Result Date: 01/01/2017 CLINICAL DATA:  51 year old female with history of abdominal pain for several days. Elevated liver function tests. EXAM: MRI ABDOMEN WITHOUT AND WITH CONTRAST (INCLUDING MRCP) TECHNIQUE: Multiplanar multisequence MR imaging of the abdomen was performed both before and after the administration of intravenous contrast. Heavily T2-weighted images of the biliary and pancreatic ducts were obtained, and three-dimensional MRCP images were rendered by post processing. CONTRAST:  55mL MULTIHANCE GADOBENATE DIMEGLUMINE 529 MG/ML IV SOLN COMPARISON:  No prior MRI of the abdomen. CT the abdomen and pelvis 12/30/2016. FINDINGS: Lower chest: Unremarkable. Hepatobiliary: Mild diffuse loss of signal intensity throughout the hepatic parenchyma on out of phase dual echo images, compatible with a background of mild hepatic steatosis. 1 cm T1 hypointense, T2 mildly hyperintense, nonenhancing lesion in segment 5 of the liver is compatible with a tiny simple cyst. No suspicious hepatic lesions. No intrahepatic biliary ductal dilatation noted on MRCP images. Status post cholecystectomy. Common bile duct measures 8 mm in the porta hepatis, presumably reflective of benign post cholecystectomy physiology. No filling defect within the common bile duct to suggest choledocholithiasis. Pancreas: No pancreatic mass. No pancreatic ductal dilatation on MRCP images. No pancreatic or peripancreatic fluid or inflammatory changes. Spleen:  Unremarkable. Adrenals/Urinary Tract: There are several T1 hypointense, T2 hyperintense,  nonenhancing lesions in the kidneys  bilaterally, compatible with simple cysts, largest of which measures 2.4 cm in the posterior aspect of the upper pole of the left kidney. No suspicious renal lesions are identified. No hydroureteronephrosis in the visualized abdomen. Bilateral adrenal glands are normal in appearance. Stomach/Bowel: Visualized portions are unremarkable. Vascular/Lymphatic: No aneurysm or dissection identified in the visualized abdominal vasculature. No lymphadenopathy noted in the abdomen. Other: No significant volume of ascites noted in the visualized portions of the peritoneal cavity. Musculoskeletal: No aggressive osseous lesions are noted in the visualized portions of the skeleton. IMPRESSION: 1. No acute findings. 2. Mild hepatic steatosis. 3. Status post cholecystectomy. Minimal common bile duct dilatation is compatible with benign post cholecystectomy physiology. No evidence of residual choledocholithiasis. 4. Additional incidental findings, as above. Electronically Signed   By: Vinnie Langton M.D.   On: 01/01/2017 11:37   Mr Abdomen Mrcp Moise Boring Contast  Result Date: 01/01/2017 CLINICAL DATA:  51 year old female with history of abdominal pain for several days. Elevated liver function tests. EXAM: MRI ABDOMEN WITHOUT AND WITH CONTRAST (INCLUDING MRCP) TECHNIQUE: Multiplanar multisequence MR imaging of the abdomen was performed both before and after the administration of intravenous contrast. Heavily T2-weighted images of the biliary and pancreatic ducts were obtained, and three-dimensional MRCP images were rendered by post processing. CONTRAST:  17mL MULTIHANCE GADOBENATE DIMEGLUMINE 529 MG/ML IV SOLN COMPARISON:  No prior MRI of the abdomen. CT the abdomen and pelvis 12/30/2016. FINDINGS: Lower chest: Unremarkable. Hepatobiliary: Mild diffuse loss of signal intensity throughout the hepatic parenchyma on out of phase dual echo images, compatible with a background of mild hepatic steatosis. 1 cm T1 hypointense, T2 mildly  hyperintense, nonenhancing lesion in segment 5 of the liver is compatible with a tiny simple cyst. No suspicious hepatic lesions. No intrahepatic biliary ductal dilatation noted on MRCP images. Status post cholecystectomy. Common bile duct measures 8 mm in the porta hepatis, presumably reflective of benign post cholecystectomy physiology. No filling defect within the common bile duct to suggest choledocholithiasis. Pancreas: No pancreatic mass. No pancreatic ductal dilatation on MRCP images. No pancreatic or peripancreatic fluid or inflammatory changes. Spleen:  Unremarkable. Adrenals/Urinary Tract: There are several T1 hypointense, T2 hyperintense, nonenhancing lesions in the kidneys bilaterally, compatible with simple cysts, largest of which measures 2.4 cm in the posterior aspect of the upper pole of the left kidney. No suspicious renal lesions are identified. No hydroureteronephrosis in the visualized abdomen. Bilateral adrenal glands are normal in appearance. Stomach/Bowel: Visualized portions are unremarkable. Vascular/Lymphatic: No aneurysm or dissection identified in the visualized abdominal vasculature. No lymphadenopathy noted in the abdomen. Other: No significant volume of ascites noted in the visualized portions of the peritoneal cavity. Musculoskeletal: No aggressive osseous lesions are noted in the visualized portions of the skeleton. IMPRESSION: 1. No acute findings. 2. Mild hepatic steatosis. 3. Status post cholecystectomy. Minimal common bile duct dilatation is compatible with benign post cholecystectomy physiology. No evidence of residual choledocholithiasis. 4. Additional incidental findings, as above. Electronically Signed   By: Vinnie Langton M.D.   On: 01/01/2017 11:37    Impression:   Much improved over past 24 hours.   MRCP negative for choledocholithiasis.  Acute biliary symptoms and bump in LFTs more likely related to SOO dysfunction although micro-lithiasis not absolutely  excluded. She will need referral to tertiary referral center for manometry versus empiric biliary and possibly pancreatic sphincterotomy. The pancreatic parenchyma appears normal on multiple imaging studies. I suspect elevated serum lipase more of a biochemical phenomenon  rather than clinically significant pancreatitis.  GERD and recurrent dysphagia needs further evaluation while she is here.  Recommendations:    I have offered the patient an EGD with esophageal dilation as feasible/appropriate tomorrow.  The risks, benefits, limitations, alternatives and imponderables have been reviewed with the patient. Potential for esophageal dilation, biopsy, etc. have also been reviewed.  Questions have been answered. All parties agreeable.  Will enlist the help of anesthesia for propofol sedation.  We'll add Levsin sublingually before meals and at bedtime to her regimen for recurrent biliary type pain.  Will stop Lovenox for tomorrow's procedure.  She will stay on a clear liquid diet until midnight tonight in preparation for tomorrow's EGD given her prior history of delayed gastric emptying.  My office will arrange referral to [facility] for further biliary intervention as an outpatient.  We will also get her set up for her first ever colonoscopy (for diagnostic purposes) in the coming weeks.

## 2017-01-02 ENCOUNTER — Encounter (HOSPITAL_COMMUNITY): Payer: Self-pay

## 2017-01-02 ENCOUNTER — Encounter (HOSPITAL_COMMUNITY)
Admission: EM | Disposition: A | Discharge status: Home / Self Care | Payer: Self-pay | Source: Home / Self Care | Attending: Family Medicine

## 2017-01-02 ENCOUNTER — Inpatient Hospital Stay (HOSPITAL_COMMUNITY): Payer: Self-pay | Admitting: Anesthesiology

## 2017-01-02 DIAGNOSIS — K2100 Gastro-esophageal reflux disease with esophagitis, without bleeding: Secondary | ICD-10-CM

## 2017-01-02 DIAGNOSIS — K449 Diaphragmatic hernia without obstruction or gangrene: Secondary | ICD-10-CM

## 2017-01-02 HISTORY — PX: ESOPHAGOGASTRODUODENOSCOPY (EGD) WITH PROPOFOL: SHX5813

## 2017-01-02 LAB — COMPREHENSIVE METABOLIC PANEL
ALT: 40 U/L (ref 14–54)
AST: 20 U/L (ref 15–41)
Albumin: 3 g/dL — ABNORMAL LOW (ref 3.5–5.0)
Alkaline Phosphatase: 73 U/L (ref 38–126)
Anion gap: 4 — ABNORMAL LOW (ref 5–15)
BILIRUBIN TOTAL: 0.1 mg/dL — AB (ref 0.3–1.2)
CO2: 27 mmol/L (ref 22–32)
CREATININE: 0.82 mg/dL (ref 0.44–1.00)
Calcium: 8.2 mg/dL — ABNORMAL LOW (ref 8.9–10.3)
Chloride: 111 mmol/L (ref 101–111)
GFR calc Af Amer: >60 mL/min (ref >60)
Glucose, Bld: 102 mg/dL — ABNORMAL HIGH (ref 65–99)
Potassium: 3.5 mmol/L (ref 3.5–5.1)
Sodium: 142 mmol/L (ref 135–145)
TOTAL PROTEIN: 5.5 g/dL — AB (ref 6.5–8.1)

## 2017-01-02 SURGERY — ESOPHAGOGASTRODUODENOSCOPY (EGD) WITH PROPOFOL
Anesthesia: Monitor Anesthesia Care

## 2017-01-02 MED ORDER — FENTANYL CITRATE (PF) 100 MCG/2ML IJ SOLN
25.0000 ug | INTRAMUSCULAR | Status: DC | PRN
  Administered 2017-01-02: 25 ug via INTRAVENOUS

## 2017-01-02 MED ORDER — HYOSCYAMINE SULFATE 0.125 MG SL SUBL: 0.1250 mg | SUBLINGUAL_TABLET | Freq: Four times a day (QID) | SUBLINGUAL | 0 refills | Status: DC | PRN

## 2017-01-02 MED ORDER — MIDAZOLAM HCL 2 MG/2ML IJ SOLN
INTRAMUSCULAR | Status: AC
  Filled 2017-01-02: qty 2

## 2017-01-02 MED ORDER — DEXLANSOPRAZOLE 60 MG PO CPDR: 60.0000 mg | DELAYED_RELEASE_CAPSULE | Freq: Every day | ORAL | 0 refills | Status: DC

## 2017-01-02 MED ORDER — PROPOFOL 10 MG/ML IV BOLUS
INTRAVENOUS | Status: DC | PRN
  Administered 2017-01-02 (×2): 20 mg via INTRAVENOUS

## 2017-01-02 MED ORDER — SUCRALFATE 1 GM/10ML PO SUSP: 1.0000 g | Freq: Three times a day (TID) | ORAL | 0 refills | Status: DC

## 2017-01-02 MED ORDER — LIDOCAINE VISCOUS 2 % MT SOLN
OROMUCOSAL | Status: AC
  Filled 2017-01-02: qty 15

## 2017-01-02 MED ORDER — FENTANYL CITRATE (PF) 100 MCG/2ML IJ SOLN
INTRAMUSCULAR | Status: AC
  Filled 2017-01-02: qty 2

## 2017-01-02 MED ORDER — MIDAZOLAM HCL 2 MG/2ML IJ SOLN
1.0000 mg | INTRAMUSCULAR | Status: DC | PRN
  Administered 2017-01-02: 2 mg via INTRAVENOUS

## 2017-01-02 MED ORDER — SODIUM CHLORIDE 0.9 % IV SOLN: INTRAVENOUS | Status: DC

## 2017-01-02 MED ORDER — LIDOCAINE VISCOUS 2 % MT SOLN
15.0000 mL | Freq: Once | OROMUCOSAL | Status: AC
  Administered 2017-01-02: 5 mL via OROMUCOSAL

## 2017-01-02 MED ORDER — PROPOFOL 500 MG/50ML IV EMUL
INTRAVENOUS | Status: DC | PRN
  Administered 2017-01-02: 125 ug/kg/min via INTRAVENOUS

## 2017-01-02 MED ORDER — LACTATED RINGERS IV SOLN
INTRAVENOUS | Status: DC
  Administered 2017-01-02: 11:00:00 via INTRAVENOUS

## 2017-01-02 NOTE — Discharge Summary (Signed)
Physician Discharge Summary  Rita [name] ASN:[id number] DOB: 1966/05/01 DOA: 12/30/2016  PCP: [facility]  Admit date: 01/03/3789 Discharge date: 01/02/2017  Admitted From: Home  Disposition:  Home   Recommendations for Outpatient Follow-up:  1. Follow up with PCP in 1 weeks 2. Follow up with GI Dr. Sydell Axon in 1 month 3. Repeat EGD in 3 months  Discharge Condition:STABLE CODE STATUS: FULL  Diet recommendation: Dysphagia 2 diet for 2 weeks   Brief/Interim Summary: HPI: Rita [name] is an 51 y.o. female with hx of tobacco abuse, Barretts with prior esophageal dilatation (Dr Sydell Axon), chronic back pain, hx of hiatal hernia, presented to the ER with daily abdominal pain for several months, with nausea and diarrhea.  She had no black or bloody stool.  She does not have a PCP due to lack of insurance, and denied alcohol use, but admitted to [facility] use and tobacco use.  Evaluation in the ER showed Lipase of 375, and abd pelvic CT showed no definite evidence of pancreatitis. She has no leukocytosis, and had normal renal Fx, but her K was low at 2.7 mm/L. She was given several rounds of dilaudiid, one run of KCL IV, and hospitalist was asked to admit her for further tx of acute pancreatitis and hypokalemia.   Mild acute pancreatitis - resolved now, lipase down to normal, GI ordered MRCP which was negative see below., advanced diet and tolerated well,  lipid panel: TC 162, LDL 98, HDL 32, Trig 159. The patient is going to be referred by GI to a tertiary center for further evaluation and management of her biliary ductal abnormalities.    Hypokalemia - repleted.   Severe esophagitis - Patient had EGD performed by Dr. Sydell Axon on 1/8 and see report below. The patient has severe erosive esophagitis. It was recommended that she continue a dysphagia 2 diet for at least 2 weeks. In addition, she was given a prescription for DEXA lent 60 mg daily in addition to a sample for 30 days that she will pick up  from the GI office. The patient will need to continue taking Carafate solution 4 times daily for the next 2 weeks. These instructions were given to the patient who verbalized understanding. The patient needs to have a repeat EGD done in 3 months.   The patient met with the case manager who provided assistance for medications for the patient. He was given a voucher. She was given written prescriptions for her medications prior to discharge. The importance of outpatient follow-up was strongly discussed with the patient who verbalized understanding.  Tobacco- the patient was strongly advised and counseled against ongoing tobacco and nicotine use. The patient verbalized understanding.  DVT prophylaxis: heparin Code Status: full  Family Communication: patient Disposition Plan: Home   Consultants:  GI  Discharge Diagnoses:  Principal Problem:   Pancreatitis, acute Active Problems:   Barrett's esophagus with esophagitis   Hypokalemia   Acute pancreatitis   Elevated liver enzymes   Biliary pain   Pain of upper abdomen   Hiatal hernia with gastroesophageal reflux disease and esophagitis  Discharge Instructions  Discharge Instructions    Increase activity slowly    Complete by:  As directed      Allergies as of 01/02/2017   No Known Allergies     Medication List    STOP taking these medications   calcium carbonate 500 MG chewable tablet Commonly known as:  TUMS - dosed in mg elemental calcium   cyclobenzaprine 10  MG tablet Commonly known as:  FLEXERIL   ROLAIDS ADVANCED 1000-200-40 MG Chew Generic drug:  Cal Carb-Mag Hydrox-Simeth   traMADol 50 MG tablet Commonly known as:  ULTRAM     TAKE these medications   dexlansoprazole 60 MG capsule Commonly known as:  DEXILANT Take 1 capsule (60 mg total) by mouth daily.   hyoscyamine 0.125 MG SL tablet Commonly known as:  LEVSIN SL Place 1 tablet (0.125 mg total) under the tongue every 6 (six) hours as needed for cramping  (abdominal pain).   sucralfate 1 GM/10ML suspension Commonly known as:  CARAFATE Take 10 mLs (1 g total) by mouth 4 (four) times daily -  with meals and at bedtime.      Follow-up Information    [facility]. Schedule an appointment as soon as possible for a visit in 2 week(s).   Why:  Hospital Follow Up  Contact information: [address] [place] [address] [phone]        Manus Rudd, MD. Schedule an appointment as soon as possible for a visit in 1 month(s).   Specialty:  Gastroenterology Why:  Hospital Follow Up  Contact information: [address] [place] [address] [phone]          No Known Allergies  Procedures/Studies: Dg Chest 2 View  Result Date: 12/30/2016 CLINICAL DATA:  History pancreatitis. Right-sided pain extends to chest. Personal history of pancreatitis and Barrett's esophagus. EXAM: CHEST  2 VIEW COMPARISON:  None. FINDINGS: The heart size is normal. Lungs are clear. There is no edema or effusion to suggest failure. IMPRESSION: No active cardiopulmonary disease. Electronically Signed   By: San Morelle M.D.   On: 12/30/2016 15:41   US Abdomen Complete  Result Date: 12/31/2016 CLINICAL DATA:  Mid abdominal pain for 3 days. Elevated liver enzymes. EXAM: ABDOMEN ULTRASOUND COMPLETE COMPARISON:  CT, 12/30/2016 FINDINGS: Gallbladder: Surgically absent Common bile duct: Diameter: 8.8 mm Liver: Increased parenchymal echogenicity. Mildly heterogeneous parenchymal echogenicity. No mass or focal lesion. IVC: No abnormality visualized. Pancreas: Obscured by midline bowel gas. Spleen: Size and appearance within normal limits. Right Kidney: Length: 10.6 cm. Echogenicity within normal limits. No mass or hydronephrosis visualized. Left Kidney: Length: 12.0 cm. Normal parenchymal echogenicity. Small cysts evident at the midpole on CT is not visualized sonographically. No sonographically visible renal masses. No stones. No hydronephrosis.  Abdominal aorta: Limited visualization.  No aneurysm seen. Other findings: None. IMPRESSION: 1. No acute findings. 2. Status post cholecystectomy with mild chronic dilation of common bile duct. 3. Hepatic steatosis. 4. Pancreas not visualized due to midline gas. Electronically Signed   By: Lajean Manes M.D.   On: 12/31/2016 11:04   Ct Abdomen Pelvis W Contrast  Result Date: 12/30/2016 CLINICAL DATA:  51 year old female with a history of abdominal pain that radiates to the chest and neck. History of pancreatitis. EXAM: CT ABDOMEN AND PELVIS WITH CONTRAST TECHNIQUE: Multidetector CT imaging of the abdomen and pelvis was performed using the standard protocol following bolus administration of intravenous contrast. CONTRAST:  1 ISOVUE-300 IOPAMIDOL (ISOVUE-300) INJECTION 61% COMPARISON:  CT the abdomen and pelvis 03/07/2014. FINDINGS: Lower chest: Small hiatal hernia. Circumferential thickening of the distal esophagus. Hepatobiliary: Diffuse low attenuation throughout the hepatic parenchyma, compatible with a background of hepatic steatosis. Subcentimeter low-attenuation lesion in the inferior aspect of the right lobe of the liver is too small to definitively characterize, but is similar to prior study 03/07/2014, likely tiny cysts. No intrahepatic biliary ductal dilatation. Status post cholecystectomy.  Common bile duct is mildly dilated measuring 10 mm in the porta hepatis, likely reflective of benign post cholecystectomy physiology. Pancreas: No pancreatic mass. No pancreatic ductal dilatation. No pancreatic or peripancreatic fluid or inflammatory changes. Spleen: Unremarkable. Adrenals/Urinary Tract: 2 cm simple cyst in the interpolar region of the left kidney. Other subcentimeter low-attenuation lesions in the kidneys bilaterally are too small to definitively characterize, but are statistically likely tiny cysts. Bilateral adrenal glands are normal in appearance. No hydroureteronephrosis. Urinary bladder is  normal in appearance. Stomach/Bowel: The appearance of the stomach is normal. There is no pathologic dilatation of small bowel or colon. Normal appendix. Vascular/Lymphatic: Aortic atherosclerosis, without evidence of aneurysm or dissection in the abdominal or pelvic vasculature. No lymphadenopathy noted in the abdomen or pelvis. Reproductive: Uterus and ovaries are unremarkable in appearance. Other: No significant volume of ascites.  No pneumoperitoneum. Musculoskeletal: There are no aggressive appearing lytic or blastic lesions noted in the visualized portions of the skeleton. IMPRESSION: 1. No acute findings in the abdomen or pelvis to account for the patient's symptoms. 2. Small hiatal hernia with profound circumferential thickening of the distal esophagus. This may simply reflect reflux esophagitis, however, correlation with follow-up nonemergent endoscopy is suggested in the near future to exclude the possibility of Barrett's metaplasia or neoplasia. 3. Normal appendix. 4. Hepatic steatosis. 5. Aortic atherosclerosis. 6. Additional incidental findings, as above. Electronically Signed   By: Vinnie Langton M.D.   On: 12/30/2016 20:57   Mr 3d Recon At Scanner  Result Date: 01/01/2017 CLINICAL DATA:  51 year old female with history of abdominal pain for several days. Elevated liver function tests. EXAM: MRI ABDOMEN WITHOUT AND WITH CONTRAST (INCLUDING MRCP) TECHNIQUE: Multiplanar multisequence MR imaging of the abdomen was performed both before and after the administration of intravenous contrast. Heavily T2-weighted images of the biliary and pancreatic ducts were obtained, and three-dimensional MRCP images were rendered by post processing. CONTRAST:  59m MULTIHANCE GADOBENATE DIMEGLUMINE 529 MG/ML IV SOLN COMPARISON:  No prior MRI of the abdomen. CT the abdomen and pelvis 12/30/2016. FINDINGS: Lower chest: Unremarkable. Hepatobiliary: Mild diffuse loss of signal intensity throughout the hepatic parenchyma  on out of phase dual echo images, compatible with a background of mild hepatic steatosis. 1 cm T1 hypointense, T2 mildly hyperintense, nonenhancing lesion in segment 5 of the liver is compatible with a tiny simple cyst. No suspicious hepatic lesions. No intrahepatic biliary ductal dilatation noted on MRCP images. Status post cholecystectomy. Common bile duct measures 8 mm in the porta hepatis, presumably reflective of benign post cholecystectomy physiology. No filling defect within the common bile duct to suggest choledocholithiasis. Pancreas: No pancreatic mass. No pancreatic ductal dilatation on MRCP images. No pancreatic or peripancreatic fluid or inflammatory changes. Spleen:  Unremarkable. Adrenals/Urinary Tract: There are several T1 hypointense, T2 hyperintense, nonenhancing lesions in the kidneys bilaterally, compatible with simple cysts, largest of which measures 2.4 cm in the posterior aspect of the upper pole of the left kidney. No suspicious renal lesions are identified. No hydroureteronephrosis in the visualized abdomen. Bilateral adrenal glands are normal in appearance. Stomach/Bowel: Visualized portions are unremarkable. Vascular/Lymphatic: No aneurysm or dissection identified in the visualized abdominal vasculature. No lymphadenopathy noted in the abdomen. Other: No significant volume of ascites noted in the visualized portions of the peritoneal cavity. Musculoskeletal: No aggressive osseous lesions are noted in the visualized portions of the skeleton. IMPRESSION: 1. No acute findings. 2. Mild hepatic steatosis. 3. Status post cholecystectomy. Minimal common bile duct dilatation is compatible with benign post cholecystectomy  physiology. No evidence of residual choledocholithiasis. 4. Additional incidental findings, as above. Electronically Signed   By: Vinnie Langton M.D.   On: 01/01/2017 11:37   Mr Abdomen Mrcp Moise Boring Contast  Result Date: 01/01/2017 CLINICAL DATA:  51 year old female with history  of abdominal pain for several days. Elevated liver function tests. EXAM: MRI ABDOMEN WITHOUT AND WITH CONTRAST (INCLUDING MRCP) TECHNIQUE: Multiplanar multisequence MR imaging of the abdomen was performed both before and after the administration of intravenous contrast. Heavily T2-weighted images of the biliary and pancreatic ducts were obtained, and three-dimensional MRCP images were rendered by post processing. CONTRAST:  41m MULTIHANCE GADOBENATE DIMEGLUMINE 529 MG/ML IV SOLN COMPARISON:  No prior MRI of the abdomen. CT the abdomen and pelvis 12/30/2016. FINDINGS: Lower chest: Unremarkable. Hepatobiliary: Mild diffuse loss of signal intensity throughout the hepatic parenchyma on out of phase dual echo images, compatible with a background of mild hepatic steatosis. 1 cm T1 hypointense, T2 mildly hyperintense, nonenhancing lesion in segment 5 of the liver is compatible with a tiny simple cyst. No suspicious hepatic lesions. No intrahepatic biliary ductal dilatation noted on MRCP images. Status post cholecystectomy. Common bile duct measures 8 mm in the porta hepatis, presumably reflective of benign post cholecystectomy physiology. No filling defect within the common bile duct to suggest choledocholithiasis. Pancreas: No pancreatic mass. No pancreatic ductal dilatation on MRCP images. No pancreatic or peripancreatic fluid or inflammatory changes. Spleen:  Unremarkable. Adrenals/Urinary Tract: There are several T1 hypointense, T2 hyperintense, nonenhancing lesions in the kidneys bilaterally, compatible with simple cysts, largest of which measures 2.4 cm in the posterior aspect of the upper pole of the left kidney. No suspicious renal lesions are identified. No hydroureteronephrosis in the visualized abdomen. Bilateral adrenal glands are normal in appearance. Stomach/Bowel: Visualized portions are unremarkable. Vascular/Lymphatic: No aneurysm or dissection identified in the visualized abdominal vasculature. No  lymphadenopathy noted in the abdomen. Other: No significant volume of ascites noted in the visualized portions of the peritoneal cavity. Musculoskeletal: No aggressive osseous lesions are noted in the visualized portions of the skeleton. IMPRESSION: 1. No acute findings. 2. Mild hepatic steatosis. 3. Status post cholecystectomy. Minimal common bile duct dilatation is compatible with benign post cholecystectomy physiology. No evidence of residual choledocholithiasis. 4. Additional incidental findings, as above. Electronically Signed   By: DVinnie LangtonM.D.   On: 01/01/2017 11:37   Dg Hip Unilat W Or Wo Pelvis 2-3 Views Left  Result Date: 12/21/2016 CLINICAL DATA:  Left hip pain. EXAM: DG HIP (WITH OR WITHOUT PELVIS) 2-3V LEFT COMPARISON:  KUB February 05, 2015 FINDINGS: A sclerotic focus over the lateral left superior pubic ramus is unchanged since the comparison, of doubtful acute significance. The pelvic bones are otherwise normal. The left hip is normal with no fractures, dislocations, or significant degenerative changes. IMPRESSION: Negative. Electronically Signed   By: DDorise BullionIII M.D   On: 12/21/2016 15:49    EGD Findings:      LA Grade D (one or more mucosal breaks involving at least 75% of       esophageal circumference) esophagitis was found 26 to 36 cm from the       incisors. Patient with severe ulcerative esophagitis and subjective       narrowing distal 5 cm the tubular esophagus. Not feasible to assess for       Barrett's. No obvious tumor. However, degree of inflammation limits       ability to survey mucosa.      A  medium-sized hiatal hernia was present.      The duodenal bulb and second portion of the duodenum were normal.      The cardia and gastric fundus were normal on retroflexion. Impression:      - LA Grade D esophagitis. Severe esophagitis. No                            dilation performed.                           - Medium-sized hiatal hernia.                            - Normal duodenal bulb and second portion of the                            duodenum.                           - No specimens collected. Moderate Sedation:      Moderate (conscious) sedation was personally administered by an       anesthesia professional. The following parameters were monitored: oxygen       saturation, heart rate, blood pressure, respiratory rate, EKG, adequacy       of pulmonary ventilation, and response to care. Total physician       intraservice time was 13 minutes. Recommendation:           - Patient has a contact number available for                            emergencies. The signs and symptoms of potential                            delayed complications were discussed with the                            patient. Return to normal activities tomorrow.                            Written discharge instructions were provided to the                            patient.                           Continue Carafate 1 g suspension 4 times a day x 2                            weeks. Patient will need SOO evaluation. Will need                            repeat EGD in 3 months. I suppose both studies                            could be done at [facility]. Patient has a 1 month  sample Dexilant 60 mg daily awaiting her at the                            office once she is discharged.                           - Return patient to hospital ward for ongoing care.                           - Dysphagia 2 diet for 4 weeks.                           - Continue present medications.    Subjective: The patient tolerated EGD procedure well.  Discharge Exam: Vitals:   01/02/17 1230 01/02/17 1305  BP: 111/65 (!) 164/83  Pulse:  63  Resp: 14 18  Temp:  98.2 F (36.8 C)   Vitals:   01/02/17 1135 01/02/17 1200 01/02/17 1230 01/02/17 1305  BP:  110/65 111/65 (!) 164/83  Pulse:    63  Resp: (!) 32 _0 Temp:  97.8 F (36.6 C)  98.2 F (36.8  C)  TempSrc:    Oral  SpO2: 98% 100% 99% 100%  Weight:      Height:       General: Pt is alert, awake, not in acute distress Cardiovascular: RRR, S1/S2 +, no rubs, no gallops Respiratory: CTA bilaterally, no wheezing, no rhonchi Abdominal: Soft, NT, ND, bowel sounds + Extremities: no edema, no cyanosis  The results of significant diagnostics from this hospitalization (including imaging, microbiology, ancillary and laboratory) are listed below for reference.     Microbiology: No results found for this or any previous visit (from the past 240 hour(s)).   Labs: BNP (last 3 results) No results for input(s): BNP in the last 8760 hours. Basic Metabolic Panel:  Recent Labs Lab 12/30/16 1535 12/31/16 0604 01/01/17 0552 01/02/17 0456  NA 136 139 141 142  K 2.7* 2.9* 3.5 3.5  CL 102 108 113* 111  CO2 _1 GLUCOSE 116* 103* 115* 102*  BUN 9 7 <5* <5*  CREATININE 0.89 0.81 0.77 0.82  CALCIUM 9.1 8.2* 8.2* 8.2*  MG  --  1.9  --   --    Liver Function Tests:  Recent Labs Lab 12/30/16 1535 12/31/16 0604 01/01/17 0552 01/02/17 0456  AST 45* 136* 41 20  ALT 34 95* 57* 40  ALKPHOS 87 136* 92 73  BILITOT 0.6 0.7 0.2* 0.1*  PROT 8.0 6.2* 5.7* 5.5*  ALBUMIN 4.4 3.5 3.3* 3.0*    Recent Labs Lab 12/30/16 1535 12/31/16 0604 01/01/17 0552  LIPASE 375* 218* 31   No results for input(s): AMMONIA in the last 168 hours. CBC:  Recent Labs Lab 12/30/16 1535 12/31/16 0604  WBC 10.4 7.7  HGB 16.2* 13.7  HCT 46.7* 40.4  MCV 87.8 88.8  PLT 390 356   Cardiac Enzymes:  Recent Labs Lab 12/30/16 1535  TROPONINI <0.03   BNP: Invalid input(s): POCBNP CBG: No results for input(s): GLUCAP in the last 168 hours. D-Dimer No results for input(s): DDIMER in the last 72 hours. Hgb A1c No results for input(s): HGBA1C in the last 72 hours. Lipid Profile  Recent Labs  01/01/17 0552  CHOL 162  HDL 32*  LDLCALC 98  TRIG 159*  CHOLHDL 5.1   Thyroid function  studies No results for input(s): TSH, T4TOTAL, T3FREE, THYROIDAB in the last 72 hours.  Invalid input(s): FREET3 Anemia work up No results for input(s): VITAMINB12, FOLATE, FERRITIN, TIBC, IRON, RETICCTPCT in the last 72 hours. Urinalysis    Component Value Date/Time   COLORURINE YELLOW 11/18/2016 1032   APPEARANCEUR CLEAR 11/18/2016 1032   LABSPEC 1.015 11/18/2016 1032   PHURINE 7.5 11/18/2016 1032   GLUCOSEU NEGATIVE 11/18/2016 1032   HGBUR TRACE (A) 11/18/2016 1032   BILIRUBINUR NEGATIVE 11/18/2016 1032   KETONESUR TRACE (A) 11/18/2016 1032   PROTEINUR 100 (A) 11/18/2016 1032   UROBILINOGEN 0.2 02/05/2015 2347   NITRITE NEGATIVE 11/18/2016 1032   LEUKOCYTESUR NEGATIVE 11/18/2016 1032   Sepsis Labs Invalid input(s): PROCALCITONIN,  WBC,  LACTICIDVEN Microbiology No results found for this or any previous visit (from the past 240 hour(s)).  Time coordinating discharge: 29 minutes  SIGNED:  Irwin Brakeman, MD  [facility] 01/02/2017, 4:07 PM Pager   If 7PM-7AM, please contact night-coverage [email or web address] Password [email or web address]

## 2017-01-02 NOTE — Interval H&P Note (Signed)
History and Physical Interval Note:  01/02/2017 11:43 AM  Rita [name]  has presented today for surgery, with the diagnosis of GERD and dysphagia  The various methods of treatment have been discussed with the patient and family. After consideration of risks, benefits and other options for treatment, the patient has consented to  Procedure(s) with comments: ESOPHAGOGASTRODUODENOSCOPY (EGD) WITH PROPOFOL (N/A) - with possible esophageal dilation as a surgical intervention .  The patient's history has been reviewed, patient examined, no change in status, stable for surgery.  I have reviewed the patient's chart and labs.  Questions were answered to the patient's satisfaction.    Patient with poor dentition. Risk of the tooth damage/loss with EGD/dilation reviewed with the patient. She seems to understand the risks and is agreeable.   Manus Rudd

## 2017-01-02 NOTE — Anesthesia Postprocedure Evaluation (Signed)
Anesthesia Post Note  Patient: Rita [name]  Procedure(s) Performed: Procedure(s) (LRB): ESOPHAGOGASTRODUODENOSCOPY (EGD) WITH PROPOFOL (N/A)  Patient location during evaluation: PACU Anesthesia Type: MAC Level of consciousness: awake and alert and oriented Pain management: pain level controlled Vital Signs Assessment: post-procedure vital signs reviewed and stable Respiratory status: spontaneous breathing Cardiovascular status: blood pressure returned to baseline Postop Assessment: no signs of nausea or vomiting Anesthetic complications: no     Last Vitals:  Vitals:   01/02/17 1130 01/02/17 1135  BP: 128/84   Pulse:    Resp: 12 (!) 32  Temp:      Last Pain:  Vitals:   01/02/17 1050  TempSrc: Oral  PainSc:                  [name]

## 2017-01-02 NOTE — H&P (View-Only) (Signed)
Patient feels much better today. Aminotransferases have dropped significantly. Serum lipase normal.  MRCP reviewed. Mild dilation of common bile duct-expected following a distant cholecystectomy. No evidence of pancreatic or peripancreatic inflammation/abnormality. No common duct stones seen.  Vital signs in last 24 hours: Temp:  [97.6 F (36.4 C)-97.8 F (36.6 C)] 97.8 F (36.6 C) (01/07 0546) Pulse Rate:  [61-64] 61 (01/07 0546) Resp:  [18] 18 (01/07 0546) BP: (91-134)/(46-85) 91/46 (01/07 0546) SpO2:  [99 %-100 %] 100 % (01/07 0546) Last BM Date: 12/31/16 General:   Alert,   pleasant and cooperative in NAD Abdomen:  Nondistended. Positive bowel sounds. Abdomen is essentially soft and nontender without appreciable mass or organomegaly  Extremities:  Without clubbing or edema.    Intake/Output from previous day: No intake/output data recorded. Intake/Output this shift: No intake/output data recorded.  Lab Results:  Recent Labs  12/30/16 1535 12/31/16 0604  WBC 10.4 7.7  HGB 16.2* 13.7  HCT 46.7* 40.4  PLT 390 356   BMET  Recent Labs  12/30/16 1535 12/31/16 0604 01/01/17 0552  NA 136 139 141  K 2.7* 2.9* 3.5  CL 102 108 113*  CO2 25 24 22   GLUCOSE 116* 103* 115*  BUN 9 7 <5*  CREATININE 0.89 0.81 0.77  CALCIUM 9.1 8.2* 8.2*   LFT  Recent Labs  12/30/16 1535  01/01/17 0552  PROT 8.0  < > 5.7*  ALBUMIN 4.4  < > 3.3*  AST 45*  < > 41  ALT 34  < > 57*  ALKPHOS 87  < > 92  BILITOT 0.6  < > 0.2*  BILIDIR 0.1  --   --   IBILI 0.5  --   --   < > = values in this interval not displayed. PT/INR No results for input(s): LABPROT, INR in the last 72 hours. Hepatitis Panel No results for input(s): HEPBSAG, HCVAB, HEPAIGM, HEPBIGM in the last 72 hours. C-Diff No results for input(s): CDIFFTOX in the last 72 hours.  Studies/Results: Dg Chest 2 View  Result Date: 12/30/2016 CLINICAL DATA:  History pancreatitis. Right-sided pain extends to chest. Personal  history of pancreatitis and Barrett's esophagus. EXAM: CHEST  2 VIEW COMPARISON:  None. FINDINGS: The heart size is normal. Lungs are clear. There is no edema or effusion to suggest failure. IMPRESSION: No active cardiopulmonary disease. Electronically Signed   By: San Morelle M.D.   On: 12/30/2016 15:41   US Abdomen Complete  Result Date: 12/31/2016 CLINICAL DATA:  Mid abdominal pain for 3 days. Elevated liver enzymes. EXAM: ABDOMEN ULTRASOUND COMPLETE COMPARISON:  CT, 12/30/2016 FINDINGS: Gallbladder: Surgically absent Common bile duct: Diameter: 8.8 mm Liver: Increased parenchymal echogenicity. Mildly heterogeneous parenchymal echogenicity. No mass or focal lesion. IVC: No abnormality visualized. Pancreas: Obscured by midline bowel gas. Spleen: Size and appearance within normal limits. Right Kidney: Length: 10.6 cm. Echogenicity within normal limits. No mass or hydronephrosis visualized. Left Kidney: Length: 12.0 cm. Normal parenchymal echogenicity. Small cysts evident at the midpole on CT is not visualized sonographically. No sonographically visible renal masses. No stones. No hydronephrosis. Abdominal aorta: Limited visualization.  No aneurysm seen. Other findings: None. IMPRESSION: 1. No acute findings. 2. Status post cholecystectomy with mild chronic dilation of common bile duct. 3. Hepatic steatosis. 4. Pancreas not visualized due to midline gas. Electronically Signed   By: Lajean Manes M.D.   On: 12/31/2016 11:04   Ct Abdomen Pelvis W Contrast  Result Date: 12/30/2016 CLINICAL DATA:  51 year old female with a history  of abdominal pain that radiates to the chest and neck. History of pancreatitis. EXAM: CT ABDOMEN AND PELVIS WITH CONTRAST TECHNIQUE: Multidetector CT imaging of the abdomen and pelvis was performed using the standard protocol following bolus administration of intravenous contrast. CONTRAST:  1 ISOVUE-300 IOPAMIDOL (ISOVUE-300) INJECTION 61% COMPARISON:  CT the abdomen and pelvis  03/07/2014. FINDINGS: Lower chest: Small hiatal hernia. Circumferential thickening of the distal esophagus. Hepatobiliary: Diffuse low attenuation throughout the hepatic parenchyma, compatible with a background of hepatic steatosis. Subcentimeter low-attenuation lesion in the inferior aspect of the right lobe of the liver is too small to definitively characterize, but is similar to prior study 03/07/2014, likely tiny cysts. No intrahepatic biliary ductal dilatation. Status post cholecystectomy. Common bile duct is mildly dilated measuring 10 mm in the porta hepatis, likely reflective of benign post cholecystectomy physiology. Pancreas: No pancreatic mass. No pancreatic ductal dilatation. No pancreatic or peripancreatic fluid or inflammatory changes. Spleen: Unremarkable. Adrenals/Urinary Tract: 2 cm simple cyst in the interpolar region of the left kidney. Other subcentimeter low-attenuation lesions in the kidneys bilaterally are too small to definitively characterize, but are statistically likely tiny cysts. Bilateral adrenal glands are normal in appearance. No hydroureteronephrosis. Urinary bladder is normal in appearance. Stomach/Bowel: The appearance of the stomach is normal. There is no pathologic dilatation of small bowel or colon. Normal appendix. Vascular/Lymphatic: Aortic atherosclerosis, without evidence of aneurysm or dissection in the abdominal or pelvic vasculature. No lymphadenopathy noted in the abdomen or pelvis. Reproductive: Uterus and ovaries are unremarkable in appearance. Other: No significant volume of ascites.  No pneumoperitoneum. Musculoskeletal: There are no aggressive appearing lytic or blastic lesions noted in the visualized portions of the skeleton. IMPRESSION: 1. No acute findings in the abdomen or pelvis to account for the patient's symptoms. 2. Small hiatal hernia with profound circumferential thickening of the distal esophagus. This may simply reflect reflux esophagitis, however,  correlation with follow-up nonemergent endoscopy is suggested in the near future to exclude the possibility of Barrett's metaplasia or neoplasia. 3. Normal appendix. 4. Hepatic steatosis. 5. Aortic atherosclerosis. 6. Additional incidental findings, as above. Electronically Signed   By: Vinnie Langton M.D.   On: 12/30/2016 20:57   Mr 3d Recon At Scanner  Result Date: 01/01/2017 CLINICAL DATA:  51 year old female with history of abdominal pain for several days. Elevated liver function tests. EXAM: MRI ABDOMEN WITHOUT AND WITH CONTRAST (INCLUDING MRCP) TECHNIQUE: Multiplanar multisequence MR imaging of the abdomen was performed both before and after the administration of intravenous contrast. Heavily T2-weighted images of the biliary and pancreatic ducts were obtained, and three-dimensional MRCP images were rendered by post processing. CONTRAST:  49mL MULTIHANCE GADOBENATE DIMEGLUMINE 529 MG/ML IV SOLN COMPARISON:  No prior MRI of the abdomen. CT the abdomen and pelvis 12/30/2016. FINDINGS: Lower chest: Unremarkable. Hepatobiliary: Mild diffuse loss of signal intensity throughout the hepatic parenchyma on out of phase dual echo images, compatible with a background of mild hepatic steatosis. 1 cm T1 hypointense, T2 mildly hyperintense, nonenhancing lesion in segment 5 of the liver is compatible with a tiny simple cyst. No suspicious hepatic lesions. No intrahepatic biliary ductal dilatation noted on MRCP images. Status post cholecystectomy. Common bile duct measures 8 mm in the porta hepatis, presumably reflective of benign post cholecystectomy physiology. No filling defect within the common bile duct to suggest choledocholithiasis. Pancreas: No pancreatic mass. No pancreatic ductal dilatation on MRCP images. No pancreatic or peripancreatic fluid or inflammatory changes. Spleen:  Unremarkable. Adrenals/Urinary Tract: There are several T1 hypointense, T2 hyperintense,  nonenhancing lesions in the kidneys  bilaterally, compatible with simple cysts, largest of which measures 2.4 cm in the posterior aspect of the upper pole of the left kidney. No suspicious renal lesions are identified. No hydroureteronephrosis in the visualized abdomen. Bilateral adrenal glands are normal in appearance. Stomach/Bowel: Visualized portions are unremarkable. Vascular/Lymphatic: No aneurysm or dissection identified in the visualized abdominal vasculature. No lymphadenopathy noted in the abdomen. Other: No significant volume of ascites noted in the visualized portions of the peritoneal cavity. Musculoskeletal: No aggressive osseous lesions are noted in the visualized portions of the skeleton. IMPRESSION: 1. No acute findings. 2. Mild hepatic steatosis. 3. Status post cholecystectomy. Minimal common bile duct dilatation is compatible with benign post cholecystectomy physiology. No evidence of residual choledocholithiasis. 4. Additional incidental findings, as above. Electronically Signed   By: Vinnie Langton M.D.   On: 01/01/2017 11:37   Mr Abdomen Mrcp Moise Boring Contast  Result Date: 01/01/2017 CLINICAL DATA:  51 year old female with history of abdominal pain for several days. Elevated liver function tests. EXAM: MRI ABDOMEN WITHOUT AND WITH CONTRAST (INCLUDING MRCP) TECHNIQUE: Multiplanar multisequence MR imaging of the abdomen was performed both before and after the administration of intravenous contrast. Heavily T2-weighted images of the biliary and pancreatic ducts were obtained, and three-dimensional MRCP images were rendered by post processing. CONTRAST:  74mL MULTIHANCE GADOBENATE DIMEGLUMINE 529 MG/ML IV SOLN COMPARISON:  No prior MRI of the abdomen. CT the abdomen and pelvis 12/30/2016. FINDINGS: Lower chest: Unremarkable. Hepatobiliary: Mild diffuse loss of signal intensity throughout the hepatic parenchyma on out of phase dual echo images, compatible with a background of mild hepatic steatosis. 1 cm T1 hypointense, T2 mildly  hyperintense, nonenhancing lesion in segment 5 of the liver is compatible with a tiny simple cyst. No suspicious hepatic lesions. No intrahepatic biliary ductal dilatation noted on MRCP images. Status post cholecystectomy. Common bile duct measures 8 mm in the porta hepatis, presumably reflective of benign post cholecystectomy physiology. No filling defect within the common bile duct to suggest choledocholithiasis. Pancreas: No pancreatic mass. No pancreatic ductal dilatation on MRCP images. No pancreatic or peripancreatic fluid or inflammatory changes. Spleen:  Unremarkable. Adrenals/Urinary Tract: There are several T1 hypointense, T2 hyperintense, nonenhancing lesions in the kidneys bilaterally, compatible with simple cysts, largest of which measures 2.4 cm in the posterior aspect of the upper pole of the left kidney. No suspicious renal lesions are identified. No hydroureteronephrosis in the visualized abdomen. Bilateral adrenal glands are normal in appearance. Stomach/Bowel: Visualized portions are unremarkable. Vascular/Lymphatic: No aneurysm or dissection identified in the visualized abdominal vasculature. No lymphadenopathy noted in the abdomen. Other: No significant volume of ascites noted in the visualized portions of the peritoneal cavity. Musculoskeletal: No aggressive osseous lesions are noted in the visualized portions of the skeleton. IMPRESSION: 1. No acute findings. 2. Mild hepatic steatosis. 3. Status post cholecystectomy. Minimal common bile duct dilatation is compatible with benign post cholecystectomy physiology. No evidence of residual choledocholithiasis. 4. Additional incidental findings, as above. Electronically Signed   By: Vinnie Langton M.D.   On: 01/01/2017 11:37    Impression:   Much improved over past 24 hours.   MRCP negative for choledocholithiasis.  Acute biliary symptoms and bump in LFTs more likely related to SOO dysfunction although micro-lithiasis not absolutely  excluded. She will need referral to tertiary referral center for manometry versus empiric biliary and possibly pancreatic sphincterotomy. The pancreatic parenchyma appears normal on multiple imaging studies. I suspect elevated serum lipase more of a biochemical phenomenon  rather than clinically significant pancreatitis.  GERD and recurrent dysphagia needs further evaluation while she is here.  Recommendations:    I have offered the patient an EGD with esophageal dilation as feasible/appropriate tomorrow.  The risks, benefits, limitations, alternatives and imponderables have been reviewed with the patient. Potential for esophageal dilation, biopsy, etc. have also been reviewed.  Questions have been answered. All parties agreeable.  Will enlist the help of anesthesia for propofol sedation.  We'll add Levsin sublingually before meals and at bedtime to her regimen for recurrent biliary type pain.  Will stop Lovenox for tomorrow's procedure.  She will stay on a clear liquid diet until midnight tonight in preparation for tomorrow's EGD given her prior history of delayed gastric emptying.  My office will arrange referral to [facility] for further biliary intervention as an outpatient.  We will also get her set up for her first ever colonoscopy (for diagnostic purposes) in the coming weeks.

## 2017-01-02 NOTE — Discharge Instructions (Signed)
Dysphagia 2 diet for 2 weeks - see attached Continue Carafate for 2 weeks You need a repeat EGD in 3 months You need to [place] - Call 1-800-QUITNOW Follow up with Dr. Sydell Axon in 1 month Take Dexilant 60 mg daily.  Pick up samples at Dr. Sydell Axon office.

## 2017-01-02 NOTE — Transfer of Care (Signed)
Immediate Anesthesia Transfer of Care Note  Patient: Rita [name]  Procedure(s) Performed: Procedure(s) with comments: ESOPHAGOGASTRODUODENOSCOPY (EGD) WITH PROPOFOL (N/A) - with possible esophageal dilation  Patient Location: PACU  Anesthesia Type:MAC  Level of Consciousness: awake and alert   Airway & Oxygen Therapy: Patient Spontanous Breathing  Post-op Assessment: Report given to RN  Post vital signs: Reviewed and stable  Last Vitals:  Vitals:   01/02/17 1130 01/02/17 1135  BP: 128/84   Pulse:    Resp: 12 (!) 32  Temp:      Last Pain:  Vitals:   01/02/17 1050  TempSrc: Oral  PainSc:       Patients Stated Pain Goal: (P) 2 (09/40/76 8088)  Complications: No apparent anesthesia complications

## 2017-01-02 NOTE — Op Note (Signed)
[facility] Patient Name: Rita [name] Procedure Date: 01/02/2017 11:15 AM MRN: [id number] Date of Birth: 21-Dec-1966 Attending MD: Norvel Richards , MD CSN: [id number] Age: 51 Admit Type: Inpatient Procedure:                Upper GI endoscopy Indications:              Dysphagia, , Heartburn/ dysphagia Providers:                Norvel Richards, MD, Lurline Del, RN, Randa Spike, Technician, Aram Candela Referring MD:              Medicines:                Propofol per Anesthesia Complications:            No immediate complications. Estimated Blood Loss:     Estimated blood loss: none. Procedure:                Pre-Anesthesia Assessment:                           - Prior to the procedure, a History and Physical                            was performed, and patient medications and                            allergies were reviewed. The patient's tolerance of                            previous anesthesia was also reviewed. The risks                            and benefits of the procedure and the sedation                            options and risks were discussed with the patient.                            All questions were answered, and informed consent                            was obtained. Prior Anticoagulants: The patient has                            taken no previous anticoagulant or antiplatelet                            agents. ASA Grade Assessment: II - A patient with                            mild systemic disease. After reviewing the risks  and benefits, the patient was deemed in                            satisfactory condition to undergo the procedure.                           After obtaining informed consent, the endoscope was                            passed under direct vision. Throughout the                            procedure, the patient's blood pressure, pulse, and                             oxygen saturations were monitored continuously. The                            [id number]) scope was introduced through the                            and advanced to the second part of duodenum. The                            upper GI endoscopy was accomplished without                            difficulty. The patient tolerated the procedure                            well. The upper GI endoscopy was accomplished                            without difficulty. The patient tolerated the                            procedure well. Scope In: 11:46:32 AM Scope Out: 11:53:03 AM Total Procedure Duration: 0 hours 6 minutes 31 seconds  Findings:      LA Grade D (one or more mucosal breaks involving at least 75% of       esophageal circumference) esophagitis was found 26 to 36 cm from the       incisors. Patient with severe ulcerative esophagitis and subjective       narrowing distal 5 cm the tubular esophagus. Not feasible to assess for       Barrett's. No obvious tumor. However, degree of inflammation limits       ability to survey mucosa.      A medium-sized hiatal hernia was present.      The duodenal bulb and second portion of the duodenum were normal.      The cardia and gastric fundus were normal on retroflexion. Impression:               - LA Grade D esophagitis. Severe esophagitis. No  dilation performed.                           - Medium-sized hiatal hernia.                           - Normal duodenal bulb and second portion of the                            duodenum.                           - No specimens collected. Moderate Sedation:      Moderate (conscious) sedation was personally administered by an       anesthesia professional. The following parameters were monitored: oxygen       saturation, heart rate, blood pressure, respiratory rate, EKG, adequacy       of pulmonary ventilation, and response to care. Total physician       intraservice  time was 13 minutes. Recommendation:           - Patient has a contact number available for                            emergencies. The signs and symptoms of potential                            delayed complications were discussed with the                            patient. Return to normal activities tomorrow.                            Written discharge instructions were provided to the                            patient.                           Continue Carafate 1 g suspension 4 times a day x 2                            weeks. Patient will need SOO evaluation. Will need                            repeat EGD in 3 months. I suppose both studies                            could be done at [facility]. Patient has a 1 month                            sample Dexilant 60 mg daily awaiting her at the                            office once she is discharged.                           -  Return patient to hospital ward for ongoing care.                           - Dysphagia 2 diet for 4 weeks.                           - Continue present medications. Procedure Code(s):        --- Professional ---                           [id number], Esophagogastroduodenoscopy, flexible,                            transoral; diagnostic, including collection of                            specimen(s) by brushing or washing, when performed                            (separate procedure) Diagnosis Code(s):        --- Professional ---                           K20.9, Esophagitis, unspecified                           K44.9, Diaphragmatic hernia without obstruction or                            gangrene                           R13.10, Dysphagia, unspecified                           R12, Heartburn CPT copyright 2016 American [employer] Association. All rights reserved. The codes documented in this report are preliminary and upon coder review may  be revised to meet current compliance requirements. Cristopher Estimable. Antoney Biven,  MD Norvel Richards, MD 01/02/2017 12:08:20 PM This report has been signed electronically. Number of Addenda: 0

## 2017-01-02 NOTE — Care Management Note (Signed)
Case Management Note  Patient Details  Name: ANALYSIA AMAR MRN: [id number] Date of Birth: Feb 03, 1966  Subjective/Objective:                  Pt admitted with pancreatitis. She is from home, lives alone and is ind with ADL's. She has no insurance and is in the process of applying for disability. She goes to the [facility] for PCP care. She will need to f/u with GI. CM has spoke with [facility], FC has worked with pt is in recent past. Pt is aware she needs to fill out financial assistance paper work after DC to assist with specialty appointments. Pt given MATCH voucher for meds at DC.   Action/Plan: Anticipate DC home in next 24hrs. No further CM needs.   Expected Discharge Date:    01/04/2016              Expected Discharge Plan:  Home/Self Care  In-House Referral:  Financial Counselor  Discharge planning Services  CM Consult, [facility]  Post Acute Care Choice:  NA Choice offered to:  NA  Status of Service:  Completed, signed off   Sherald Barge, RN 01/02/2017, 3:34 PM

## 2017-01-02 NOTE — Anesthesia Preprocedure Evaluation (Signed)
Anesthesia Evaluation  Patient identified by MRN, date of birth, ID band Patient awake    Reviewed: Allergy & Precautions, NPO status , Patient's Chart, lab work & pertinent test results  Airway Mallampati: III  TM Distance: >3 FB     Dental  (+) Poor Dentition, Chipped, Missing, Dental Advisory Given   Pulmonary Current Smoker, PE: am cough.   breath sounds clear to auscultation       Cardiovascular negative cardio ROS   Rhythm:Regular Rate:Normal     Neuro/Psych    GI/Hepatic hiatal hernia, PUD, GERD  ,  Endo/Other    Renal/GU      Musculoskeletal   Abdominal   Peds  Hematology   Anesthesia Other Findings   Reproductive/Obstetrics                             Anesthesia Physical Anesthesia Plan  ASA: II  Anesthesia Plan: MAC   Post-op Pain Management:    Induction: Intravenous  Airway Management Planned: Simple Face Mask  Additional Equipment:   Intra-op Plan:   Post-operative Plan:   Informed Consent: I have reviewed the patients History and Physical, chart, labs and discussed the procedure including the risks, benefits and alternatives for the proposed anesthesia with the patient or authorized representative who has indicated his/her understanding and acceptance.     Plan Discussed with:   Anesthesia Plan Comments:         Anesthesia Quick Evaluation

## 2017-01-03 ENCOUNTER — Encounter: Payer: Self-pay | Admitting: Internal Medicine

## 2017-01-04 ENCOUNTER — Encounter (HOSPITAL_COMMUNITY): Payer: Self-pay | Admitting: Internal Medicine

## 2017-01-04 NOTE — Care Management (Signed)
Patient has lost [place] voucher, new MATCH voucher faxed to [employer] in [place]. Prescriptions are at [facility]. Left message for patient on [phone] that voucher was faxed and they can pick up prescriptions.

## 2017-03-21 ENCOUNTER — Other Ambulatory Visit: Payer: Self-pay

## 2017-03-21 ENCOUNTER — Encounter: Payer: Self-pay | Admitting: Gastroenterology

## 2017-03-21 ENCOUNTER — Ambulatory Visit (INDEPENDENT_AMBULATORY_CARE_PROVIDER_SITE_OTHER): Payer: Self-pay | Admitting: Gastroenterology

## 2017-03-21 VITALS — BP 172/106 | HR 77 | Temp 97.8°F | Ht 67.0 in | Wt 195.6 lb

## 2017-03-21 DIAGNOSIS — K449 Diaphragmatic hernia without obstruction or gangrene: Secondary | ICD-10-CM

## 2017-03-21 DIAGNOSIS — R748 Abnormal levels of other serum enzymes: Secondary | ICD-10-CM

## 2017-03-21 DIAGNOSIS — K8050 Calculus of bile duct without cholangitis or cholecystitis without obstruction: Secondary | ICD-10-CM

## 2017-03-21 DIAGNOSIS — R1011 Right upper quadrant pain: Secondary | ICD-10-CM

## 2017-03-21 DIAGNOSIS — K210 Gastro-esophageal reflux disease with esophagitis: Secondary | ICD-10-CM

## 2017-03-21 DIAGNOSIS — K625 Hemorrhage of anus and rectum: Secondary | ICD-10-CM

## 2017-03-21 DIAGNOSIS — R1310 Dysphagia, unspecified: Secondary | ICD-10-CM

## 2017-03-21 NOTE — Progress Notes (Signed)
Please save Dexilant samples for patient when available.

## 2017-03-21 NOTE — Assessment & Plan Note (Signed)
Suspected biliary pain and possibly sphincter of OD dysfunction given bump in LFTs associated with right upper quadrant pain of acute onset. Given severe esophagitis on noted upper endoscopy, plan for repeat EGD as outlined, hopefully with esophageal dilation this time. Based on findings, she still may require referral to [facility] for sphincter of Oddi dysfunction workup.

## 2017-03-21 NOTE — Assessment & Plan Note (Signed)
No prior colonoscopy. Recommend colonoscopy with deep sedation in the OR with Dr. Gala Romney.  I have discussed the risks, alternatives, benefits with regards to but not limited to the risk of reaction to medication, bleeding, infection, perforation and the patient is agreeable to proceed. Written consent to be obtained.

## 2017-03-21 NOTE — Assessment & Plan Note (Signed)
LA grade D esophagitis, previous Barrett's history, dysphagia with subjective narrowing of the distal esophagus. Recommend EGD with possible esophageal dilation this time if appropriate with deep sedation.  I have discussed the risks, alternatives, benefits with regards to but not limited to the risk of reaction to medication, bleeding, infection, perforation and the patient is agreeable to proceed. Written consent to be obtained.Take Dexilant daily. Patient assistance forms provided. No samples of dexilant available but nexium provided to take 20mg  bid. She can call for dexilant samples in couple of days.

## 2017-03-21 NOTE — Patient Instructions (Signed)
1. Complete patient assistance forms for [place].  2. Upper endoscopy and colonoscopy as scheduled. See separate instructions.  3. Continue Dexilant once daily before breakfast. Samples provided. You need to complete patient assistance forms as soon as possible.

## 2017-03-21 NOTE — Progress Notes (Signed)
Primary Care Physician:  Raiford Simmonds., PA-C  Primary Gastroenterologist:  Garfield Cornea, MD   Chief Complaint  Patient presents with  . Gastroesophageal Reflux    repeat EGD, Dexilant has helped  . Nausea  . Emesis  . Dysphagia  . Abdominal Pain    right side into back and jaw    HPI:  Rita [name] is a 50 y.o. female here to schedule 3 month follow-up EGD. She was seen in the hospital back in January with recurrent epigastric/right upper quadrant pain associated with nausea/vomiting, bump in her aminotransferases (AST 136/ALT 95/alkaline phosphatase 135) and lipase (375). Ultrasound and CT demonstrated mild dilated CBD without intrahepatic biliary dilation, thickened distal esophageal wall, pancreas appear normal. Patient does not drink, denies opioid therapy. Gallbladder removed remotely. Long-standing history of complicated GERD and was not taking acid suppression regularly back in 12/2016. No prior colonoscopy. EUS in 2014 with normal pancreas and biliary tree done for similar symptoms which she's had for years. Previous esophageal biopsies with Barrett's esophagus but not seen on subsequent biopsies. MRCP done in January 2018 as well with mild hepatic steatosis, tiny hepatic simple cyst, no intrahepatic biliary dilation, CBD 8 mm, no filling defect in the common bile duct, pancreas normal. EGD 12/2016 with LA grade D esophagitisAnd subjective Nehring distal 5 cm of the tubular esophagus, could not evaluate for Barrett's or perform dilation, medium-sized hiatal hernia.  Belching associated with regurgitation. Wakes up with fluid on mouth and on bed. Sleeps on back and sitting upright. Daily heartburn and nausea terrible. If bend over regurgitation. Dexilant helps some but still with breakthrough symptoms. Admits that she has to "stretch out" her Dexilant to make it lasts, takes one every 1-2 days. Uninsured and living on samples. Epigastric pain into ruq pain and into jaw. Happens at least  3-5 times per day. Cannot stand anything touching the right flank/rib area. Feels like constant bruising there and hurts to have bra on. Bm regular. Some fresh blood per rectum with wiping. No melena. Out of Levsin given for ?sphincter of Oddi dysfunction. Has not bee referred to [facility] yet.     Current Outpatient Prescriptions  Medication Sig Dispense Refill  . dexlansoprazole (DEXILANT) 60 MG capsule Take 1 capsule (60 mg total) by mouth daily. 30 capsule 0  . hyoscyamine (LEVSIN SL) 0.125 MG SL tablet Place 1 tablet (0.125 mg total) under the tongue every 6 (six) hours as needed for cramping (abdominal pain). 30 tablet 0  . sucralfate (CARAFATE) 1 GM/10ML suspension Take 10 mLs (1 g total) by mouth 4 (four) times daily -  with meals and at bedtime. (Patient taking differently: Take 1 g by mouth as needed. ) 420 mL 0   No current facility-administered medications for this visit.     Allergies as of 03/21/2017  . (No Known Allergies)    Past Medical History:  Diagnosis Date  . Barrett's esophagus with esophagitis 03/26/2013  . Chronic abdominal pain   . Chronic back pain   . GERD (gastroesophageal reflux disease)   . Hiatal hernia   . Pancreatitis   . Peptic ulcer   . Tobacco use 03/26/2013    Past Surgical History:  Procedure Laterality Date  . CESAREAN SECTION    . CHOLECYSTECTOMY    . ESOPHAGOGASTRODUODENOSCOPY  Oct 2011   Dr. Laural Golden: ulcer in distal esophagus, soft stricture at GE junction s/p balloon dilation PATH: BARRETT'S  . ESOPHAGOGASTRODUODENOSCOPY (EGD) WITH ESOPHAGEAL DILATION N/A 03/27/2013   Procedure: ESOPHAGOGASTRODUODENOSCOPY (EGD)  WITH ESOPHAGEAL DILATION;  Surgeon: Daneil Dolin, MD;  Location: [facility];  Service: Endoscopy;  Laterality: N/A;  possible dilation  . ESOPHAGOGASTRODUODENOSCOPY (EGD) WITH PROPOFOL N/A 01/02/2017   Procedure: ESOPHAGOGASTRODUODENOSCOPY (EGD) WITH PROPOFOL;  Surgeon: Daneil Dolin, MD;  Location: [facility];  Service: Endoscopy;   Laterality: N/A;  with possible esophageal dilation  . EUS N/A 05/09/2013   Procedure: UPPER ENDOSCOPIC ULTRASOUND (EUS) LINEAR;  Surgeon: Milus Banister, MD;  Location: [facility];  Service: Endoscopy;  Laterality: N/A;  . TUBAL LIGATION      Family History  Problem Relation Age of Onset  . Asthma Mother   . Heart failure Mother   . Cancer Mother   . Diabetes Mother   . Hypertension Mother   . Stroke Mother   . Pancreatic cancer Mother     deceased  . Heart failure Father   . Diabetes Father   . Colon cancer Neg Hx     Social History   Social History  . Marital status: Single    Spouse name: N/A  . Number of children: N/A  . Years of education: N/A   Occupational History  . farm    Social History Main Topics  . Smoking status: Current Every Day Smoker    Packs/day: 1.00    Years: 20.00    Types: Cigarettes  . Smokeless tobacco: Never Used  . Alcohol use No  . Drug use: No  . Sexual activity: Not on file   Other Topics Concern  . Not on file   Social History Narrative  . No narrative on file      ROS:  General: Negative for anorexia, weight loss, fever, chills, fatigue, weakness. Eyes: Negative for vision changes.  ENT: Negative for hoarseness, nasal congestion.Complains of dysphagia. CV: Negative for chest pain, angina, palpitations, dyspnea on exertion, peripheral edema.  Respiratory: Negative for dyspnea at rest, dyspnea on exertion, cough, sputum, wheezing.  GI: See history of present illness. GU:  Negative for dysuria, hematuria, urinary incontinence, urinary frequency, nocturnal urination.  MS: Negative for joint pain, low back pain.  Derm: Negative for rash or itching.  Neuro: Negative for weakness, abnormal sensation, seizure, frequent headaches, memory loss, confusion.  Psych: Negative for anxiety, depression, suicidal ideation, hallucinations.  Endo: Negative for unusual weight change.  Heme: Negative for bruising or bleeding. Allergy:  Negative for rash or hives.    Physical Examination:  BP (!) 172/106   Pulse 77   Temp 97.8 F (36.6 C) (Oral)   Ht 5\' 7"  (1.702 m)   Wt 195 lb 9.6 oz (88.7 kg)   LMP 02/09/2012   BMI 30.64 kg/m    General: Well-nourished, well-developed in no acute distress.  Head: Normocephalic, atraumatic.   Eyes: Conjunctiva pink, no icterus. Mouth: Oropharyngeal mucosa moist and pink , no lesions erythema or exudate. Neck: Supple without thyromegaly, masses, or lymphadenopathy.  Lungs: Clear to auscultation bilaterally.  Heart: Regular rate and rhythm, no murmurs rubs or gallops.  Abdomen: Bowel sounds are normal, mild to moderate epigastric/right upper quadrant pain, nondistended, no hepatosplenomegaly or masses, no abdominal bruits or    hernia , no rebound or guarding.   Rectal: Not performed Extremities: No lower extremity edema. No clubbing or deformities.  Neuro: Alert and oriented x 4 , grossly normal neurologically.  Skin: Warm and dry, no rash or jaundice.   Psych: Alert and cooperative, normal mood and affect.  Labs: Lab Results  Component Value Date  CREATININE 0.82 01/02/2017   BUN <5 (L) 01/02/2017   NA 142 01/02/2017   K 3.5 01/02/2017   CL 111 01/02/2017   CO2 27 01/02/2017   Lab Results  Component Value Date   ALT 40 01/02/2017   AST 20 01/02/2017   ALKPHOS 73 01/02/2017   BILITOT 0.1 (L) 01/02/2017   Lab Results  Component Value Date   WBC 7.7 12/31/2016   HGB 13.7 12/31/2016   HCT 40.4 12/31/2016   MCV 88.8 12/31/2016   PLT 356 12/31/2016     Imaging Studies: No results found.

## 2017-03-22 ENCOUNTER — Other Ambulatory Visit: Payer: Self-pay | Admitting: Gastroenterology

## 2017-03-22 MED ORDER — DEXLANSOPRAZOLE 60 MG PO CPDR
60.0000 mg | DELAYED_RELEASE_CAPSULE | Freq: Every day | ORAL | 3 refills | Status: DC
Start: 2017-03-22 — End: 2017-04-19

## 2017-03-22 NOTE — Progress Notes (Signed)
RX printed for [facility] patient assistance program.

## 2017-03-22 NOTE — Progress Notes (Signed)
cc'ed to pcp °

## 2017-03-22 NOTE — Progress Notes (Signed)
Drug rep came yesterday afternoon, #3 boxes of dexilant are at the front desk. Pt assistance paperwork has been faxed to Denyse Amass at [employer]. Tried to call the pt, she was not available, LM that we had some samples here for her.

## 2017-03-23 NOTE — Progress Notes (Signed)
Pt has picked up samples.  

## 2017-04-07 NOTE — Patient Instructions (Addendum)
Rita [name]  04/07/2017     @PREFPERIOPPHARMACY @   Your procedure is scheduled on 04/17/2017.  Report to [facility] at 10:30 A.M.  Call this number if you have problems the morning of surgery:  [phone]   Remember:  Do not eat food or drink liquids after midnight.  Take these medicines the morning of surgery with A SIP OF WATER : Dexilant   Do not wear jewelry, make-up or nail polish.  Do not wear lotions, powders, or perfumes, or deoderant.  Do not shave 48 hours prior to surgery.  Men may shave face and neck.  Do not bring valuables to the hospital.  [facility] is not responsible for any belongings or valuables.  Contacts, dentures or bridgework may not be worn into surgery.  Leave your suitcase in the car.  After surgery it may be brought to your room.  For patients admitted to the hospital, discharge time will be determined by your treatment team.  Patients discharged the day of surgery will not be allowed to drive home.   Name and phone number of your driver:   family Special instructions:  n/a  Please read over the following fact sheets that you were given. Care and Recovery After Surgery       Esophagogastroduodenoscopy Esophagogastroduodenoscopy (EGD) is a procedure to examine the lining of the esophagus, stomach, and first part of the small intestine (duodenum). This procedure is done to check for problems such as inflammation, bleeding, ulcers, or growths. During this procedure, a long, flexible, lighted tube with a camera attached (endoscope) is inserted down the throat. Tell a health care provider about:  Any allergies you have.  All medicines you are taking, including vitamins, herbs, eye drops, creams, and over-the-counter medicines.  Any problems you or family members have had with anesthetic medicines.  Any blood disorders you have.  Any surgeries you have had.  Any medical conditions you have.  Whether you are pregnant or may be  pregnant. What are the risks? Generally, this is a safe procedure. However, problems may occur, including:  Infection.  Bleeding.  A tear (perforation) in the esophagus, stomach, or duodenum.  Trouble breathing.  Excessive sweating.  Spasms of the larynx.  A slowed heartbeat.  Low blood pressure. What happens before the procedure?  Follow instructions from your health care provider about eating or drinking restrictions.  Ask your health care provider about:  Changing or stopping your regular medicines. This is especially important if you are taking diabetes medicines or blood thinners.  Taking medicines such as aspirin and ibuprofen. These medicines can thin your blood. Do not take these medicines before your procedure if your health care provider instructs you not to.  Plan to have someone take you home after the procedure.  If you wear dentures, be ready to remove them before the procedure. What happens during the procedure?  To reduce your risk of infection, your health care team will wash or sanitize their hands.  An IV tube will be put in a vein in your hand or arm. You will get medicines and fluids through this tube.  You will be given one or more of the following:  A medicine to help you relax (sedative).  A medicine to numb the area (local anesthetic). This medicine may be sprayed into your throat. It will make you feel more comfortable and keep you from gagging or coughing during the procedure.  A medicine for pain.  A mouth guard may  be placed in your mouth to protect your teeth and to keep you from biting on the endoscope.  You will be asked to lie on your left side.  The endoscope will be lowered down your throat into your esophagus, stomach, and duodenum.  Air will be put into the endoscope. This will help your health care provider see better.  The lining of your esophagus, stomach, and duodenum will be examined.  Your health care provider  may:  Take a tissue sample so it can be looked at in a lab (biopsy).  Remove growths.  Remove objects (foreign bodies) that are stuck.  Treat any bleeding with medicines or other devices that stop tissue from bleeding.  Widen (dilate) or stretch narrowed areas of your esophagus and stomach.  The endoscope will be taken out. The procedure may vary among health care providers and hospitals. What happens after the procedure?  Your blood pressure, heart rate, breathing rate, and blood oxygen level will be monitored often until the medicines you were given have worn off.  Do not eat or drink anything until the numbing medicine has worn off and your gag reflex has returned. This information is not intended to replace advice given to you by your health care provider. Make sure you discuss any questions you have with your health care provider. Document Released: 04/14/2005 Document Revised: 05/19/2016 Document Reviewed: 11/05/2015 Elsevier Interactive Patient Education  2017 [place].  Colonoscopy, Adult A colonoscopy is an exam to look at the entire large intestine. During the exam, a lubricated, bendable tube is inserted into the anus and then passed into the rectum, colon, and other parts of the large intestine. A colonoscopy is often done as a part of normal colorectal screening or in response to certain symptoms, such as anemia, persistent diarrhea, abdominal pain, and blood in the stool. The exam can help screen for and diagnose medical problems, including:  Tumors.  Polyps.  Inflammation.  Areas of bleeding. Tell a health care provider about:  Any allergies you have.  All medicines you are taking, including vitamins, herbs, eye drops, creams, and over-the-counter medicines.  Any problems you or family members have had with anesthetic medicines.  Any blood disorders you have.  Any surgeries you have had.  Any medical conditions you have.  Any problems you have had  passing stool. What are the risks? Generally, this is a safe procedure. However, problems may occur, including:  Bleeding.  A tear in the intestine.  A reaction to medicines given during the exam.  Infection (rare). What happens before the procedure? Eating and drinking restrictions  Follow instructions from your health care provider about eating and drinking, which may include:  A few days before the procedure - follow a low-fiber diet. Avoid nuts, seeds, dried fruit, raw fruits, and vegetables.  1-3 days before the procedure - follow a clear liquid diet. Drink only clear liquids, such as clear broth or bouillon, black coffee or tea, clear juice, clear soft drinks or sports drinks, gelatin dessert, and popsicles. Avoid any liquids that contain red or purple dye.  On the day of the procedure - do not eat or drink anything during the 2 hours before the procedure, or within the time period that your health care provider recommends. Bowel prep  If you were prescribed an oral bowel prep to clean out your colon:  Take it as told by your health care provider. Starting the day before your procedure, you will need to drink a large  amount of medicated liquid. The liquid will cause you to have multiple loose stools until your stool is almost clear or light green.  If your skin or anus gets irritated from diarrhea, you may use these to relieve the irritation:  Medicated wipes, such as adult wet wipes with aloe and vitamin E.  A skin soothing-product like petroleum jelly.  If you vomit while drinking the bowel prep, take a break for up to 60 minutes and then begin the bowel prep again. If vomiting continues and you cannot take the bowel prep without vomiting, call your health care provider. General instructions   Ask your health care provider about changing or stopping your regular medicines. This is especially important if you are taking diabetes medicines or blood thinners.  Plan to have  someone take you home from the hospital or clinic. What happens during the procedure?  An IV tube may be inserted into one of your veins.  You will be given medicine to help you relax (sedative).  To reduce your risk of infection:  Your health care team will wash or sanitize their hands.  Your anal area will be washed with soap.  You will be asked to lie on your side with your knees bent.  Your health care provider will lubricate a long, thin, flexible tube. The tube will have a camera and a light on the end.  The tube will be inserted into your anus.  The tube will be gently eased through your rectum and colon.  Air will be delivered into your colon to keep it open. You may feel some pressure or cramping.  The camera will be used to take images during the procedure.  A small tissue sample may be removed from your body to be examined under a microscope (biopsy). If any potential problems are found, the tissue will be sent to a lab for testing.  If small polyps are found, your health care provider may remove them and have them checked for cancer cells.  The tube that was inserted into your anus will be slowly removed. The procedure may vary among health care providers and hospitals. What happens after the procedure?  Your blood pressure, heart rate, breathing rate, and blood oxygen level will be monitored until the medicines you were given have worn off.  Do not drive for 24 hours after the exam.  You may have a small amount of blood in your stool.  You may pass gas and have mild abdominal cramping or bloating due to the air that was used to inflate your colon during the exam.  It is up to you to get the results of your procedure. Ask your health care provider, or the department performing the procedure, when your results will be ready. This information is not intended to replace advice given to you by your health care provider. Make sure you discuss any questions you have  with your health care provider. Document Released: 12/09/2000 Document Revised: 10/12/2016 Document Reviewed: 02/23/2016 Elsevier Interactive Patient Education  2017 [employer].

## 2017-04-11 ENCOUNTER — Encounter (HOSPITAL_COMMUNITY)
Admission: RE | Admit: 2017-04-11 | Discharge: 2017-04-11 | Disposition: A | Discharge status: Home / Self Care | Payer: Self-pay | Source: Ambulatory Visit | Attending: Internal Medicine | Admitting: Internal Medicine

## 2017-04-13 ENCOUNTER — Other Ambulatory Visit (HOSPITAL_COMMUNITY): Payer: Self-pay

## 2017-04-14 ENCOUNTER — Encounter (HOSPITAL_COMMUNITY)
Admission: RE | Admit: 2017-04-14 | Discharge: 2017-04-14 | Disposition: A | Discharge status: Home / Self Care | Payer: Self-pay | Source: Ambulatory Visit | Attending: Internal Medicine | Admitting: Internal Medicine

## 2017-04-14 ENCOUNTER — Encounter (HOSPITAL_COMMUNITY): Payer: Self-pay

## 2017-04-14 DIAGNOSIS — R1011 Right upper quadrant pain: Secondary | ICD-10-CM | POA: Insufficient documentation

## 2017-04-14 DIAGNOSIS — Z01812 Encounter for preprocedural laboratory examination: Secondary | ICD-10-CM | POA: Insufficient documentation

## 2017-04-14 DIAGNOSIS — K625 Hemorrhage of anus and rectum: Secondary | ICD-10-CM | POA: Insufficient documentation

## 2017-04-14 DIAGNOSIS — R1310 Dysphagia, unspecified: Secondary | ICD-10-CM | POA: Insufficient documentation

## 2017-04-14 HISTORY — DX: Unspecified osteoarthritis, unspecified site: M19.90

## 2017-04-14 HISTORY — DX: Cardiac murmur, unspecified: R01.1

## 2017-04-14 LAB — BASIC METABOLIC PANEL
ANION GAP: 11 (ref 5–15)
BUN: 8 mg/dL (ref 6–20)
CALCIUM: 9.2 mg/dL (ref 8.9–10.3)
CO2: 27 mmol/L (ref 22–32)
Chloride: 100 mmol/L — ABNORMAL LOW (ref 101–111)
Creatinine, Ser: 0.7 mg/dL (ref 0.44–1.00)
GLUCOSE: 110 mg/dL — AB (ref 65–99)
POTASSIUM: 2.6 mmol/L — AB (ref 3.5–5.1)
Sodium: 138 mmol/L (ref 135–145)

## 2017-04-14 NOTE — Progress Notes (Signed)
   04/14/17 1345  OBSTRUCTIVE SLEEP APNEA  Have you ever been diagnosed with sleep apnea through a sleep study? No  Do you snore loudly (loud enough to be heard through closed doors)?  1  Do you often feel tired, fatigued, or sleepy during the daytime (such as falling asleep during driving or talking to someone)? 1  Has anyone observed you stop breathing during your sleep? 1  Do you have, or are you being treated for high blood pressure? 0  BMI more than 35 kg/m2? 0  Age > 50 (1-yes) 1  Neck circumference greater than:Female 16 inches or larger, Female 17inches or larger? 0  Female Gender (Yes=1) 0  Obstructive Sleep Apnea Score 4  Score 5 or greater  Results sent to PCP

## 2017-04-14 NOTE — Pre-Procedure Instructions (Signed)
Dr Patsey Berthold consulted about potassium and labs will be repeated on arrival.

## 2017-04-17 ENCOUNTER — Ambulatory Visit (HOSPITAL_COMMUNITY): Payer: Self-pay | Admitting: Anesthesiology

## 2017-04-17 ENCOUNTER — Encounter (HOSPITAL_COMMUNITY): Payer: Self-pay

## 2017-04-17 ENCOUNTER — Encounter (HOSPITAL_COMMUNITY)
Admission: RE | Disposition: A | Discharge status: Home / Self Care | Payer: Self-pay | Source: Ambulatory Visit | Attending: Internal Medicine

## 2017-04-17 ENCOUNTER — Ambulatory Visit (HOSPITAL_COMMUNITY)
Admission: RE | Admit: 2017-04-17 | Discharge: 2017-04-17 | Disposition: A | Discharge status: Home / Self Care | Payer: Self-pay | Source: Ambulatory Visit | Attending: Internal Medicine | Admitting: Internal Medicine

## 2017-04-17 DIAGNOSIS — Z833 Family history of diabetes mellitus: Secondary | ICD-10-CM | POA: Insufficient documentation

## 2017-04-17 DIAGNOSIS — K449 Diaphragmatic hernia without obstruction or gangrene: Secondary | ICD-10-CM | POA: Insufficient documentation

## 2017-04-17 DIAGNOSIS — Z8249 Family history of ischemic heart disease and other diseases of the circulatory system: Secondary | ICD-10-CM | POA: Insufficient documentation

## 2017-04-17 DIAGNOSIS — Z823 Family history of stroke: Secondary | ICD-10-CM | POA: Insufficient documentation

## 2017-04-17 DIAGNOSIS — F17210 Nicotine dependence, cigarettes, uncomplicated: Secondary | ICD-10-CM | POA: Insufficient documentation

## 2017-04-17 DIAGNOSIS — K5730 Diverticulosis of large intestine without perforation or abscess without bleeding: Secondary | ICD-10-CM | POA: Insufficient documentation

## 2017-04-17 DIAGNOSIS — K625 Hemorrhage of anus and rectum: Secondary | ICD-10-CM

## 2017-04-17 DIAGNOSIS — D123 Benign neoplasm of transverse colon: Secondary | ICD-10-CM | POA: Insufficient documentation

## 2017-04-17 DIAGNOSIS — R1011 Right upper quadrant pain: Secondary | ICD-10-CM

## 2017-04-17 DIAGNOSIS — K209 Esophagitis, unspecified: Secondary | ICD-10-CM

## 2017-04-17 DIAGNOSIS — R1310 Dysphagia, unspecified: Secondary | ICD-10-CM | POA: Insufficient documentation

## 2017-04-17 DIAGNOSIS — Z800 Family history of malignant neoplasm of digestive organs: Secondary | ICD-10-CM | POA: Insufficient documentation

## 2017-04-17 DIAGNOSIS — K921 Melena: Secondary | ICD-10-CM | POA: Insufficient documentation

## 2017-04-17 DIAGNOSIS — K210 Gastro-esophageal reflux disease with esophagitis: Secondary | ICD-10-CM | POA: Insufficient documentation

## 2017-04-17 HISTORY — PX: POLYPECTOMY: SHX5525

## 2017-04-17 HISTORY — PX: BIOPSY: SHX5522

## 2017-04-17 HISTORY — PX: MALONEY DILATION: SHX5535

## 2017-04-17 HISTORY — PX: COLONOSCOPY WITH PROPOFOL: SHX5780

## 2017-04-17 HISTORY — PX: ESOPHAGOGASTRODUODENOSCOPY (EGD) WITH PROPOFOL: SHX5813

## 2017-04-17 LAB — POCT I-STAT 4, (NA,K, GLUC, HGB,HCT)
GLUCOSE: 113 mg/dL — AB (ref 65–99)
HEMATOCRIT: 38 % (ref 36.0–46.0)
HEMOGLOBIN: 12.9 g/dL (ref 12.0–15.0)
POTASSIUM: 2.9 mmol/L — AB (ref 3.5–5.1)
Sodium: 143 mmol/L (ref 135–145)

## 2017-04-17 SURGERY — COLONOSCOPY WITH PROPOFOL
Anesthesia: Monitor Anesthesia Care

## 2017-04-17 MED ORDER — FENTANYL CITRATE (PF) 100 MCG/2ML IJ SOLN
INTRAMUSCULAR | Status: AC
  Filled 2017-04-17: qty 2

## 2017-04-17 MED ORDER — MIDAZOLAM HCL 2 MG/2ML IJ SOLN
1.0000 mg | INTRAMUSCULAR | Status: AC
  Administered 2017-04-17: 2 mg via INTRAVENOUS

## 2017-04-17 MED ORDER — PROPOFOL 500 MG/50ML IV EMUL
INTRAVENOUS | Status: DC | PRN
  Administered 2017-04-17 (×2): via INTRAVENOUS
  Administered 2017-04-17: 100 ug/kg/min via INTRAVENOUS

## 2017-04-17 MED ORDER — LIDOCAINE HCL (PF) 1 % IJ SOLN
INTRAMUSCULAR | Status: AC
  Filled 2017-04-17: qty 5

## 2017-04-17 MED ORDER — LIDOCAINE HCL (CARDIAC) 10 MG/ML IV SOLN
INTRAVENOUS | Status: DC | PRN
  Administered 2017-04-17: 50 mg via INTRAVENOUS

## 2017-04-17 MED ORDER — MIDAZOLAM HCL 2 MG/2ML IJ SOLN
INTRAMUSCULAR | Status: AC
  Filled 2017-04-17: qty 2

## 2017-04-17 MED ORDER — PROPOFOL 10 MG/ML IV BOLUS
INTRAVENOUS | Status: DC | PRN
  Administered 2017-04-17 (×3): 20 mg via INTRAVENOUS

## 2017-04-17 MED ORDER — CHLORHEXIDINE GLUCONATE CLOTH 2 % EX PADS: 6.0000 | MEDICATED_PAD | Freq: Once | CUTANEOUS | Status: DC

## 2017-04-17 MED ORDER — PROPOFOL 10 MG/ML IV BOLUS
INTRAVENOUS | Status: AC
  Filled 2017-04-17: qty 20

## 2017-04-17 MED ORDER — FENTANYL CITRATE (PF) 100 MCG/2ML IJ SOLN
25.0000 ug | INTRAMUSCULAR | Status: AC
  Administered 2017-04-17: 25 ug via INTRAVENOUS

## 2017-04-17 MED ORDER — LACTATED RINGERS IV SOLN
INTRAVENOUS | Status: DC | PRN
  Administered 2017-04-17: 11:00:00 via INTRAVENOUS

## 2017-04-17 MED ORDER — LIDOCAINE VISCOUS 2 % MT SOLN
15.0000 mL | Freq: Once | OROMUCOSAL | Status: AC
  Administered 2017-04-17: 15 mL via OROMUCOSAL

## 2017-04-17 MED ORDER — LACTATED RINGERS IV SOLN: INTRAVENOUS | Status: DC

## 2017-04-17 MED ORDER — LIDOCAINE VISCOUS 2 % MT SOLN
OROMUCOSAL | Status: AC
  Filled 2017-04-17: qty 15

## 2017-04-17 NOTE — Interval H&P Note (Signed)
History and Physical Interval Note:  04/17/2017 11:09 AM  Rita [name]  has presented today for surgery, with the diagnosis of RUQ PAIN/DYSPHAGIA/RECTAL BLEEDING  The various methods of treatment have been discussed with the patient and family. After consideration of risks, benefits and other options for treatment, the patient has consented to  Procedure(s) with comments: COLONOSCOPY WITH PROPOFOL (N/A) - 100 ESOPHAGOGASTRODUODENOSCOPY (EGD) WITH PROPOFOL (N/A) MALONEY DILATION (N/A) as a surgical intervention .  The patient's history has been reviewed, patient examined, no change in status, stable for surgery.  I have reviewed the patient's chart and labs.  Questions were answered to the patient's satisfaction.     Rita [name]  Potassium 2.6  3 days ago. Has been on KCl supplementation. Otherwise no change. EGD/ED plus colonoscopy per plan today.  The risks, benefits, limitations, imponderables and alternatives regarding both EGD and colonoscopy have been reviewed with the patient. Questions have been answered. All parties agreeable.

## 2017-04-17 NOTE — Anesthesia Procedure Notes (Signed)
Procedure Name: MAC Date/Time: 04/17/2017 11:36 AM Performed by: [facility] Deck Pre-anesthesia Checklist: Patient identified, Emergency Drugs available, Suction available, Timeout performed and Patient being monitored Patient Re-evaluated:Patient Re-evaluated prior to inductionOxygen Delivery Method: Non-rebreather mask

## 2017-04-17 NOTE — H&P (View-Only) (Signed)
Please save Dexilant samples for patient when available.

## 2017-04-17 NOTE — Op Note (Signed)
[facility] Patient Name: Rita [name] Procedure Date: 04/17/2017 10:54 AM MRN: [id number] Date of Birth: [birth year] Attending MD: Norvel Richards , MD CSN: [id number] Age: 51 Admit Type: Outpatient Procedure:                Upper GI endoscopy Indications:              Dysphagia, Heartburn Providers:                Norvel Richards, MD, Hinton Rao, RN,                            Randa Spike, Technician Referring MD:              Medicines:                Propofol per Anesthesia Complications:            No immediate complications. Estimated Blood Loss:     Estimated blood loss was minimal. Procedure:                Pre-Anesthesia Assessment:                           - Prior to the procedure, a History and Physical                            was performed, and patient medications and                            allergies were reviewed. The patient's tolerance of                            previous anesthesia was also reviewed. The risks                            and benefits of the procedure and the sedation                            options and risks were discussed with the patient.                            All questions were answered, and informed consent                            was obtained. Prior Anticoagulants: The patient has                            taken no previous anticoagulant or antiplatelet                            agents. ASA Grade Assessment: III - A patient with                            severe systemic disease. After reviewing the risks  and benefits, the patient was deemed in                            satisfactory condition to undergo the procedure.                           After obtaining informed consent, the endoscope was                            passed under direct vision. Throughout the                            procedure, the patient's blood pressure, pulse, and                            oxygen  saturations were monitored continuously. The                            [id number] scope was introduced through the                            and advanced to the second part of duodenum. The                            upper GI endoscopy was accomplished without                            difficulty. The patient tolerated the procedure                            well. Scope In: 11:44:11 AM Scope Out: 11:52:49 AM Total Procedure Duration: 0 hours 8 minutes 38 seconds  Findings:      LA Grade B (one or more mucosal breaks greater than 5 mm, not extending       between the tops of two mucosal folds) esophagitis was found 33 to 35 cm       from the incisors. Mild benign stricturing of the GE junction present.       No tumor seen. Salmon-colored mucosa was present. The maximum       longitudinal extent of these esophageal mucosal changes was 4 cm in       length This was biopsied with a cold forceps for histology. Estimated       blood loss was minimal.      A medium-sized hiatal hernia was present.      The exam was otherwise without abnormality.      The duodenal bulb and second portion of the duodenum were normal.      . The scope was withdrawn. Dilation was performed with a Maloney dilator       with mild resistance at 65 Fr. The dilation site was examined following       endoscope reinsertion and showed mild improvement in luminal narrowing.       Estimated blood loss was minimal. Finally, biopsies of the salmon       colored epithelium taken. Impression:               -  LA Grade B esophagitis. Abnormal distal                            esophagus. Status post dilation and biopsy                           - Medium-sized hiatal hernia.                           - The examination was otherwise normal.                           - No specimens collected. Moderate Sedation:      Moderate (conscious) sedation was personally administered by an       anesthesia professional. The  following parameters were monitored: oxygen       saturation, heart rate, blood pressure, respiratory rate, EKG, adequacy       of pulmonary ventilation, and response to care. Total physician       intraservice time was 38 minutes. Recommendation:           - Patient has a contact number available for                            emergencies. The signs and symptoms of potential                            delayed complications were discussed with the                            patient. Return to normal activities tomorrow.                            Written discharge instructions were provided to the                            patient.                           - Resume previous diet.                           - Continue present medications.                           - Await pathology results.                           - No repeat upper endoscopy.                           - Return to GI office in 8 weeks. See colonoscopy                            report. Procedure Code(s):        --- Professional ---  49201, Esophagogastroduodenoscopy, flexible,                            transoral; with biopsy, single or multiple                           43450, Dilation of esophagus, by unguided sound or                            bougie, single or multiple passes Diagnosis Code(s):        --- Professional ---                           K20.9, Esophagitis, unspecified                           K44.9, Diaphragmatic hernia without obstruction or                            gangrene                           R13.10, Dysphagia, unspecified                           R12, Heartburn CPT copyright 2016 [employer]. All rights reserved. The codes documented in this report are preliminary and upon coder review may  be revised to meet current compliance requirements. Cristopher Estimable. Rourk, MD Norvel Richards, MD 04/17/2017 12:25:21 PM This report has been signed  electronically. Number of Addenda: 0

## 2017-04-17 NOTE — H&P (View-Only) (Signed)
   04/14/17 1345  OBSTRUCTIVE SLEEP APNEA  Have you ever been diagnosed with sleep apnea through a sleep study? No  Do you snore loudly (loud enough to be heard through closed doors)?  1  Do you often feel tired, fatigued, or sleepy during the daytime (such as falling asleep during driving or talking to someone)? 1  Has anyone observed you stop breathing during your sleep? 1  Do you have, or are you being treated for high blood pressure? 0  BMI more than 35 kg/m2? 0  Age > 50 (1-yes) 1  Neck circumference greater than:Female 16 inches or larger, Female 17inches or larger? 0  Female Gender (Yes=1) 0  Obstructive Sleep Apnea Score 4  Score 5 or greater  Results sent to PCP

## 2017-04-17 NOTE — Op Note (Signed)
[facility] Patient Name: Rita [name] Procedure Date: 04/17/2017 11:56 AM MRN: [id number] Date of Birth: 10/30/1966 Attending MD: Norvel Richards , MD CSN: [id number] Age: 51 Admit Type: Outpatient Procedure:                Colonoscopy with snare polypectomy Indications:              Hematochezia Providers:                Norvel Richards, MD, Hinton Rao, RN,                            Randa Spike, Technician Referring MD:              Medicines:                Propofol per Anesthesia Complications:            No immediate complications. Estimated Blood Loss:     Estimated blood loss: none. Procedure:                Pre-Anesthesia Assessment:                           - Prior to the procedure, a History and Physical                            was performed, and patient medications and                            allergies were reviewed. The patient's tolerance of                            previous anesthesia was also reviewed. The risks                            and benefits of the procedure and the sedation                            options and risks were discussed with the patient.                            All questions were answered, and informed consent                            was obtained. Prior Anticoagulants: The patient has                            taken no previous anticoagulant or antiplatelet                            agents. ASA Grade Assessment: III - A patient with                            severe systemic disease. After reviewing the risks  and benefits, the patient was deemed in                            satisfactory condition to undergo the procedure.                           After obtaining informed consent, the colonoscope                            was passed under direct vision. Throughout the                            procedure, the patient's blood pressure, pulse, and                            oxygen  saturations were monitored continuously. The                            [id number] ([id number]) scope was introduced through                            the and advanced to the the cecum, identified by                            appendiceal orifice and ileocecal valve. The                            ileocecal valve, appendiceal orifice, and rectum                            were photographed. The entire colon was well                            visualized. The colonoscopy was performed without                            difficulty. The patient tolerated the procedure                            well. The quality of the bowel preparation was                            adequate. Scope In: 11:59:31 AM Scope Out: 12:18:30 PM Scope Withdrawal Time: 0 hours 6 minutes 10 seconds  Total Procedure Duration: 0 hours 18 minutes 59 seconds  Findings:      Scattered small and large-mouthed diverticula were found in the sigmoid       colon and descending colon.      A 15 mm polyp was found in the hepatic flexure. The polyp was       semi-pedunculated. The polyp was removed with a hot snare. Resection and       retrieval were complete. Estimated blood loss: none.      A 7 mm polyp was found in the hepatic flexure. The polyp was       semi-pedunculated. The  polyp was removed with a hot snare. Resection and       retrieval were complete. Estimated blood loss: none.      Internal hemorrhoids were found during retroflexion. The hemorrhoids       were moderate and Grade I (internal hemorrhoids that do not prolapse).      The exam was otherwise without abnormality on direct and retroflexion       views. Impression:               - Diverticulosis in the sigmoid colon and in the                            descending colon.                           - One 15 mm polyp at the hepatic flexure, removed                            with a hot snare and SKIP. Resected and retrieved.                           - One 7 mm  polyp at the hepatic flexure, removed                            with a hot snare. Resected and retrieved.                           - Internal hemorrhoids.                           - The examination was otherwise normal on direct                            and retroflexion views. Moderate Sedation:      Moderate (conscious) sedation was personally administered by an       anesthesia professional. The following parameters were monitored: oxygen       saturation, heart rate, blood pressure, respiratory rate, EKG, adequacy       of pulmonary ventilation, and response to care. Total physician       intraservice time was 38 minutes. Recommendation:           - Patient has a contact number available for                            emergencies. The signs and symptoms of potential                            delayed complications were discussed with the                            patient. Return to normal activities tomorrow.                            Written discharge instructions were provided to the  patient.                           - Resume previous diet.                           - Continue present medications.                           - Repeat colonoscopy date to be determined after                            pending pathology results are reviewed for                            surveillance based on pathology results.                           - Return to GI clinic in 8 weeks. follow-up with a                            new primary care physician regarding hypokalemia.                            See eGD report. Procedure Code(s):        --- Professional ---                           [phone], Colonoscopy, flexible; with removal of                            tumor(s), polyp(s), or other lesion(s) by snare                            technique Diagnosis Code(s):        --- Professional ---                           K64.0, First degree hemorrhoids                            D12.3, Benign neoplasm of transverse colon (hepatic                            flexure or splenic flexure)                           K92.1, Melena (includes Hematochezia)                           K57.30, Diverticulosis of large intestine without                            perforation or abscess without bleeding CPT copyright 2016 American Medical Association. All rights reserved. The codes documented in this report are preliminary and upon coder review may  be revised to meet current compliance requirements. Cristopher Estimable. Erienne Spelman,  MD Norvel Richards, MD 04/17/2017 12:31:02 PM This report has been signed electronically. Number of Addenda: 0

## 2017-04-17 NOTE — Transfer of Care (Signed)
Immediate Anesthesia Transfer of Care Note  Patient: Rita [name]  Procedure(s) Performed: Procedure(s) with comments: COLONOSCOPY WITH PROPOFOL (N/A) - 100 ESOPHAGOGASTRODUODENOSCOPY (EGD) WITH PROPOFOL (N/A) MALONEY DILATION (N/A) BIOPSY - esophageal  POLYPECTOMY  Patient Location: PACU  Anesthesia Type:MAC  Level of Consciousness: awake, alert , oriented and patient cooperative  Airway & Oxygen Therapy: Patient Spontanous Breathing and Patient connected to nasal cannula oxygen  Post-op Assessment: Report given to RN and Post -op Vital signs reviewed and stable  Post vital signs: Reviewed and stable  Last Vitals:  Vitals:   04/17/17 1110  BP: 129/70  Pulse: 62  Resp: 12  Temp: 36.6 C    Last Pain:  Vitals:   04/17/17 1110  TempSrc: Oral  PainSc: 8       Patients Stated Pain Goal: 9 (54/65/68 1275)  Complications: No apparent anesthesia complications

## 2017-04-17 NOTE — Interval H&P Note (Signed)
History and Physical Interval Note:  04/17/2017 11:32 AM  Rita [name]  has presented today for surgery, with the diagnosis of RUQ PAIN/DYSPHAGIA/RECTAL BLEEDING  The various methods of treatment have been discussed with the patient and family. After consideration of risks, benefits and other options for treatment, the patient has consented to  Procedure(s) with comments: COLONOSCOPY WITH PROPOFOL (N/A) - 100 ESOPHAGOGASTRODUODENOSCOPY (EGD) WITH PROPOFOL (N/A) MALONEY DILATION (N/A) as a surgical intervention .  The patient's history has been reviewed, patient examined, no change in status, stable for surgery.  I have reviewed the patient's chart and labs.  Questions were answered to the patient's satisfaction.     Rita [name]  No change. EGD with ED and colonoscopy for rectal bleeding per plan.  The risks, benefits, limitations, imponderables and alternatives regarding both EGD and colonoscopy have been reviewed with the patient. Questions have been answered. All parties agreeable.

## 2017-04-17 NOTE — Anesthesia Preprocedure Evaluation (Signed)
Anesthesia Evaluation  Patient identified by MRN, date of birth, ID band Patient awake    Reviewed: Allergy & Precautions, NPO status , Patient's Chart, lab work & pertinent test results  Airway Mallampati: III  TM Distance: >3 FB     Dental  (+) Poor Dentition, Chipped, Missing, Dental Advisory Given   Pulmonary Current Smoker, PE: am cough.   breath sounds clear to auscultation       Cardiovascular negative cardio ROS   Rhythm:Regular Rate:Normal     Neuro/Psych    GI/Hepatic hiatal hernia, PUD, GERD  ,Pancreratitis   Endo/Other    Renal/GU      Musculoskeletal   Abdominal   Peds  Hematology  (+) anemia ,   Anesthesia Other Findings   Reproductive/Obstetrics                             Anesthesia Physical Anesthesia Plan  ASA: II  Anesthesia Plan: MAC   Post-op Pain Management:    Induction: Intravenous  Airway Management Planned: Simple Face Mask  Additional Equipment:   Intra-op Plan:   Post-operative Plan:   Informed Consent: I have reviewed the patients History and Physical, chart, labs and discussed the procedure including the risks, benefits and alternatives for the proposed anesthesia with the patient or authorized representative who has indicated his/her understanding and acceptance.     Plan Discussed with:   Anesthesia Plan Comments:         Anesthesia Quick Evaluation

## 2017-04-17 NOTE — Anesthesia Postprocedure Evaluation (Signed)
Anesthesia Post Note  Patient: Rita [name]  Procedure(s) Performed: Procedure(s) (LRB): COLONOSCOPY WITH PROPOFOL (N/A) ESOPHAGOGASTRODUODENOSCOPY (EGD) WITH PROPOFOL (N/A) MALONEY DILATION (N/A) BIOPSY POLYPECTOMY  Patient location during evaluation: PACU Anesthesia Type: MAC Level of consciousness: awake and alert, oriented and patient cooperative Pain management: pain level controlled Vital Signs Assessment: post-procedure vital signs reviewed and stable Respiratory status: respiratory function stable and patient connected to nasal cannula oxygen Cardiovascular status: stable Postop Assessment: no signs of nausea or vomiting Anesthetic complications: no     Last Vitals:  Vitals:   04/17/17 1110  BP: 129/70  Pulse: 62  Resp: 12  Temp: 36.6 C    Last Pain:  Vitals:   04/17/17 1110  TempSrc: Oral  PainSc: 8                  ADAMS, AMY A

## 2017-04-17 NOTE — Discharge Instructions (Addendum)
Colonoscopy Discharge Instructions  Read the instructions outlined below and refer to this sheet in the next few weeks. These discharge instructions provide you with general information on caring for yourself after you leave the hospital. Your doctor may also give you specific instructions. While your treatment has been planned according to the most current medical practices available, unavoidable complications occasionally occur. If you have any problems or questions after discharge, call Dr. Gala Romney at [phone]. ACTIVITY  You may resume your regular activity, but move at a slower pace for the next 24 hours.   Take frequent rest periods for the next 24 hours.   Walking will help get rid of the air and reduce the bloated feeling in your belly (abdomen).   No driving for 24 hours (because of the medicine (anesthesia) used during the test).    Do not sign any important legal documents or operate any machinery for 24 hours (because of the anesthesia used during the test).  NUTRITION  Drink plenty of fluids.   You may resume your normal diet as instructed by your doctor.   Begin with a light meal and progress to your normal diet. Heavy or fried foods are harder to digest and may make you feel sick to your stomach (nauseated).   Avoid alcoholic beverages for 24 hours or as instructed.  MEDICATIONS  You may resume your normal medications unless your doctor tells you otherwise.  WHAT YOU CAN EXPECT TODAY  Some feelings of bloating in the abdomen.   Passage of more gas than usual.   Spotting of blood in your stool or on the toilet paper.  IF YOU HAD POLYPS REMOVED DURING THE COLONOSCOPY:  No aspirin products for 7 days or as instructed.   No alcohol for 7 days or as instructed.   Eat a soft diet for the next 24 hours.  FINDING OUT THE RESULTS OF YOUR TEST Not all test results are available during your visit. If your test results are not back during the visit, make an appointment  with your caregiver to find out the results. Do not assume everything is normal if you have not heard from your caregiver or the medical facility. It is important for you to follow up on all of your test results.  SEEK IMMEDIATE MEDICAL ATTENTION IF:  You have more than a spotting of blood in your stool.   Your belly is swollen (abdominal distention).   You are nauseated or vomiting.   You have a temperature over 101.   You have abdominal pain or discomfort that is severe or gets worse throughout the day.    Polyp, hemorrhoid and diverticulosis information provided  No NSAIDs like Aleve or Advil or even aspirin 10 days  GERD information provided  Continue Dexilant 60 mg daily-go by my office for free samples  Further recommendations to follow pending review of pathology report  Complete course of potassium chloride tablets  Make an appointment to see Dr. Meda Coffee as your new  Primary care physician  Office visit with [place] in 2 months    Gastroesophageal Reflux Disease, Adult Normally, food travels down the esophagus and stays in the stomach to be digested. If a person has gastroesophageal reflux disease (GERD), food and stomach acid move back up into the esophagus. When this happens, the esophagus becomes sore and swollen (inflamed). Over time, GERD can make small holes (ulcers) in the lining of the esophagus. Follow these instructions at home: Diet   Follow a diet as told  by your doctor. You may need to avoid foods and drinks such as:  Coffee and tea (with or without caffeine).  Drinks that contain alcohol.  Energy drinks and sports drinks.  Carbonated drinks or sodas.  Chocolate and cocoa.  Peppermint and mint flavorings.  Garlic and onions.  Horseradish.  Spicy and acidic foods, such as peppers, chili powder, curry powder, vinegar, hot sauces, and BBQ sauce.  Citrus fruit juices and citrus fruits, such as oranges, lemons, and limes.  Tomato-based foods,  such as red sauce, chili, salsa, and pizza with red sauce.  Fried and fatty foods, such as donuts, french fries, potato chips, and high-fat dressings.  High-fat meats, such as hot dogs, rib eye steak, sausage, ham, and bacon.  High-fat dairy items, such as whole milk, butter, and cream cheese.  Eat small meals often. Avoid eating large meals.  Avoid drinking large amounts of liquid with your meals.  Avoid eating meals during the 2-3 hours before bedtime.  Avoid lying down right after you eat.  Do not exercise right after you eat. General instructions   Pay attention to any changes in your symptoms.  Take over-the-counter and prescription medicines only as told by your doctor. Do not take aspirin, ibuprofen, or other NSAIDs unless your doctor says it is okay.  Do not use any tobacco products, including cigarettes, chewing tobacco, and e-cigarettes. If you need help quitting, ask your doctor.  Wear loose clothes. Do not wear anything tight around your waist.  Raise (elevate) the head of your bed about 6 inches (15 cm).  Try to lower your stress. If you need help doing this, ask your doctor.  If you are overweight, lose an amount of weight that is healthy for you. Ask your doctor about a safe weight loss goal.  Keep all follow-up visits as told by your doctor. This is important. Contact a doctor if:  You have new symptoms.  You lose weight and you do not know why it is happening.  You have trouble swallowing, or it hurts to swallow.  You have wheezing or a cough that keeps happening.  Your symptoms do not get better with treatment.  You have a hoarse voice. Get help right away if:  You have pain in your arms, neck, jaw, teeth, or back.  You feel sweaty, dizzy, or light-headed.  You have chest pain or shortness of breath.  You throw up (vomit) and your throw up looks like blood or coffee grounds.  You pass out (faint).  Your poop (stool) is bloody or  black.  You cannot swallow, drink, or eat. This information is not intended to replace advice given to you by your health care provider. Make sure you discuss any questions you have with your health care provider. Document Released: 05/30/2008 Document Revised: 05/19/2016 Document Reviewed: 04/08/2015 Elsevier Interactive Patient Education  2017 [place].    Colon Polyps Polyps are tissue growths inside the body. Polyps can grow in many places, including the large intestine (colon). A polyp may be a round bump or a mushroom-shaped growth. You could have one polyp or several. Most colon polyps are noncancerous (benign). However, some colon polyps can become cancerous over time. What are the causes? The exact cause of colon polyps is not known. What increases the risk? This condition is more likely to develop in people who:  Have a family history of colon cancer or colon polyps.  Are older than 50 or older than 45 if they are African  American.  Have inflammatory bowel disease, such as ulcerative colitis or Crohn disease.  Are overweight.  Smoke cigarettes.  Do not get enough exercise.  Drink too much alcohol.  Eat a diet that is:  High in fat and red meat.  Low in fiber.  Had childhood cancer that was treated with abdominal radiation. What are the signs or symptoms? Most polyps do not cause symptoms. If you have symptoms, they may include:  Blood coming from your rectum when having a bowel movement.  Blood in your stool.The stool may look dark red or black.  A change in bowel habits, such as constipation or diarrhea. How is this diagnosed? This condition is diagnosed with a colonoscopy. This is a procedure that uses a lighted, flexible scope to look at the inside of your colon. How is this treated? Treatment for this condition involves removing any polyps that are found. Those polyps will then be tested for cancer. If cancer is found, your health care provider  will talk to you about options for colon cancer treatment. Follow these instructions at home: Diet   Eat plenty of fiber, such as fruits, vegetables, and whole grains.  Eat foods that are high in calcium and vitamin D, such as milk, cheese, yogurt, eggs, liver, fish, and broccoli.  Limit foods high in fat, red meats, and processed meats, such as hot dogs, sausage, bacon, and lunch meats.  Maintain a healthy weight, or lose weight if recommended by your health care provider. General instructions   Do not smoke cigarettes.  Do not drink alcohol excessively.  Keep all follow-up visits as told by your health care provider. This is important. This includes keeping regularly scheduled colonoscopies. Talk to your health care provider about when you need a colonoscopy.  Exercise every day or as told by your health care provider. Contact a health care provider if:  You have new or worsening bleeding during a bowel movement.  You have new or increased blood in your stool.  You have a change in bowel habits.  You unexpectedly lose weight. This information is not intended to replace advice given to you by your health care provider. Make sure you discuss any questions you have with your health care provider. Document Released: 09/07/2004 Document Revised: 05/19/2016 Document Reviewed: 11/02/2015 Elsevier Interactive Patient Education  2017 [place].    Hemorrhoids Hemorrhoids are swollen veins in and around the rectum or anus. There are two types of hemorrhoids:  Internal hemorrhoids. These occur in the veins that are just inside the rectum. They may poke through to the outside and become irritated and painful.  External hemorrhoids. These occur in the veins that are outside of the anus and can be felt as a painful swelling or hard lump near the anus. Most hemorrhoids do not cause serious problems, and they can be managed with home treatments such as diet and lifestyle changes. If  home treatments do not help your symptoms, procedures can be done to shrink or remove the hemorrhoids. What are the causes? This condition is caused by increased pressure in the anal area. This pressure may result from various things, including:  Constipation.  Straining to have a bowel movement.  Diarrhea.  Pregnancy.  Obesity.  Sitting for long periods of time.  Heavy lifting or other activity that causes you to strain.  Anal sex. What are the signs or symptoms? Symptoms of this condition include:  Pain.  Anal itching or irritation.  Rectal bleeding.  Leakage of stool (  feces).  Anal swelling.  One or more lumps around the anus. How is this diagnosed? This condition can often be diagnosed through a visual exam. Other exams or tests may also be done, such as:  Examination of the rectal area with a gloved hand (digital rectal exam).  Examination of the anal canal using a small tube (anoscope).  A blood test, if you have lost a significant amount of blood.  A test to look inside the colon (sigmoidoscopy or colonoscopy). How is this treated? This condition can usually be treated at home. However, various procedures may be done if dietary changes, lifestyle changes, and other home treatments do not help your symptoms. These procedures can help make the hemorrhoids smaller or remove them completely. Some of these procedures involve surgery, and others do not. Common procedures include:  Rubber band ligation. Rubber bands are placed at the base of the hemorrhoids to cut off the blood supply to them.  Sclerotherapy. Medicine is injected into the hemorrhoids to shrink them.  Infrared coagulation. A type of light energy is used to get rid of the hemorrhoids.  Hemorrhoidectomy surgery. The hemorrhoids are surgically removed, and the veins that supply them are tied off.  Stapled hemorrhoidopexy surgery. A circular stapling device is used to remove the hemorrhoids and use  staples to cut off the blood supply to them. Follow these instructions at home: Eating and drinking   Eat foods that have a lot of fiber in them, such as whole grains, beans, nuts, fruits, and vegetables. Ask your health care provider about taking products that have added fiber (fiber supplements).  Drink enough fluid to keep your urine clear or pale yellow. Managing pain and swelling   Take warm sitz baths for 20 minutes, 3-4 times a day to ease pain and discomfort.  If directed, apply ice to the affected area. Using ice packs between sitz baths may be helpful.  Put ice in a plastic bag.  Place a towel between your skin and the bag.  Leave the ice on for 20 minutes, 2-3 times a day. General instructions   Take over-the-counter and prescription medicines only as told by your health care provider.  Use medicated creams or suppositories as told.  Exercise regularly.  Go to the bathroom when you have the urge to have a bowel movement. Do not wait.  Avoid straining to have bowel movements.  Keep the anal area dry and clean. Use wet toilet paper or moist towelettes after a bowel movement.  Do not sit on the toilet for long periods of time. This increases blood pooling and pain. Contact a health care provider if:  You have increasing pain and swelling that are not controlled by treatment or medicine.  You have uncontrolled bleeding.  You have difficulty having a bowel movement, or you are unable to have a bowel movement.  You have pain or inflammation outside the area of the hemorrhoids. This information is not intended to replace advice given to you by your health care provider. Make sure you discuss any questions you have with your health care provider. Document Released: 12/09/2000 Document Revised: 05/11/2016 Document Reviewed: 08/26/2015 Elsevier Interactive Patient Education  2017 [employer]    Diverticulosis Diverticulosis is a condition that develops when small  pouches (diverticula) form in the wall of the large intestine (colon). The colon is where water is absorbed and stool is formed. The pouches form when the inside layer of the colon pushes through weak spots in the outer  layers of the colon. You may have a few pouches or many of them. What are the causes? The cause of this condition is not known. What increases the risk? The following factors may make you more likely to develop this condition:  Being older than age 16. Your risk for this condition increases with age. Diverticulosis is rare among people younger than age 39. By age 22, many people have it.  Eating a low-fiber diet.  Having frequent constipation.  Being overweight.  Not getting enough exercise.  Smoking.  Taking over-the-counter pain medicines, like aspirin and ibuprofen.  Having a family history of diverticulosis. What are the signs or symptoms? In most people, there are no symptoms of this condition. If you do have symptoms, they may include:  Bloating.  Cramps in the abdomen.  Constipation or diarrhea.  Pain in the lower left side of the abdomen. How is this diagnosed? This condition is most often diagnosed during an exam for other colon problems. Because diverticulosis usually has no symptoms, it often cannot be diagnosed independently. This condition may be diagnosed by:  Using a flexible scope to examine the colon (colonoscopy).  Taking an X-ray of the colon after dye has been put into the colon (barium enema).  Doing a CT scan. How is this treated? You may not need treatment for this condition if you have never developed an infection related to diverticulosis. If you have had an infection before, treatment may include:  Eating a high-fiber diet. This may include eating more fruits, vegetables, and grains.  Taking a fiber supplement.  Taking a live bacteria supplement (probiotic).  Taking medicine to relax your colon.  Taking antibiotic  medicines. Follow these instructions at home:  Drink 6-8 glasses of water or more each day to prevent constipation.  Try not to strain when you have a bowel movement.  If you have had an infection before:  Eat more fiber as directed by your health care provider or your diet and nutrition specialist (dietitian).  Take a fiber supplement or probiotic, if your health care provider approves.  Take over-the-counter and prescription medicines only as told by your health care provider.  If you were prescribed an antibiotic, take it as told by your health care provider. Do not stop taking the antibiotic even if you start to feel better.  Keep all follow-up visits as told by your health care provider. This is important. Contact a health care provider if:  You have pain in your abdomen.  You have bloating.  You have cramps.  You have not had a bowel movement in 3 days. Get help right away if:  Your pain gets worse.  Your bloating becomes very bad.  You have a fever or chills, and your symptoms suddenly get worse.  You vomit.  You have bowel movements that are bloody or black.  You have bleeding from your rectum. Summary  Diverticulosis is a condition that develops when small pouches (diverticula) form in the wall of the large intestine (colon).  You may have a few pouches or many of them.  This condition is most often diagnosed during an exam for other colon problems.  If you have had an infection related to diverticulosis, treatment may include increasing the fiber in your diet, taking supplements, or taking medicines. This information is not intended to replace advice given to you by your health care provider. Make sure you discuss any questions you have with your health care provider. Document Released: 09/08/2004 Document Revised:  10/31/2016 Document Reviewed: 10/31/2016 Elsevier Interactive Patient Education  2017 [employer].

## 2017-04-17 NOTE — Pre-Procedure Instructions (Signed)
Patient in for PAT late on Friday afternoon. Potasium 2.6. Office was closed. Spoke with Dr Patsey Berthold and will repeat labs on arrival.

## 2017-04-18 ENCOUNTER — Encounter: Payer: Self-pay | Admitting: Internal Medicine

## 2017-04-20 NOTE — H&P (View-Only) (Signed)
RX printed for [facility] patient assistance program.

## 2017-04-20 NOTE — H&P (Signed)
No change 

## 2017-04-20 NOTE — Interval H&P Note (Signed)
History and Physical Interval Note:  04/20/2017 3:03 PM  Rita [name]  has presented today for surgery, with the diagnosis of RUQ PAIN/DYSPHAGIA/RECTAL BLEEDING  The various methods of treatment have been discussed with the patient and family. After consideration of risks, benefits and other options for treatment, the patient has consented to  Procedure(s) with comments: COLONOSCOPY WITH PROPOFOL (N/A) - 100 ESOPHAGOGASTRODUODENOSCOPY (EGD) WITH PROPOFOL (N/A) MALONEY DILATION (N/A) BIOPSY - esophageal  POLYPECTOMY as a surgical intervention .  The patient's history has been reviewed, patient examined, no change in status, stable for surgery.  I have reviewed the patient's chart and labs.  Questions were answered to the patient's satisfaction.     Manus Rudd

## 2017-04-24 ENCOUNTER — Encounter (HOSPITAL_COMMUNITY): Payer: Self-pay | Admitting: Internal Medicine

## 2017-05-18 ENCOUNTER — Telehealth: Payer: Self-pay | Admitting: Internal Medicine

## 2017-05-18 NOTE — Telephone Encounter (Signed)
[phone] patient called inquiring if we have any more samples of her heartburn medication, stated rmr gives her samples because she can not afford a prescription.

## 2017-05-19 NOTE — Telephone Encounter (Signed)
PATIENT AWARE

## 2017-05-19 NOTE — Telephone Encounter (Signed)
I only have a couple of boxes available and they are at the front desk with patient assistance forms. Please let her know.

## 2017-06-19 ENCOUNTER — Encounter: Payer: Self-pay | Admitting: Internal Medicine

## 2017-06-19 ENCOUNTER — Ambulatory Visit (INDEPENDENT_AMBULATORY_CARE_PROVIDER_SITE_OTHER): Payer: Self-pay | Admitting: Nurse Practitioner

## 2017-06-19 ENCOUNTER — Encounter: Payer: Self-pay | Admitting: Nurse Practitioner

## 2017-06-19 VITALS — BP 143/83 | HR 90 | Temp 99.1°F | Ht 67.0 in | Wt 190.0 lb

## 2017-06-19 DIAGNOSIS — R109 Unspecified abdominal pain: Secondary | ICD-10-CM

## 2017-06-19 DIAGNOSIS — K219 Gastro-esophageal reflux disease without esophagitis: Secondary | ICD-10-CM

## 2017-06-19 DIAGNOSIS — K625 Hemorrhage of anus and rectum: Secondary | ICD-10-CM

## 2017-06-19 DIAGNOSIS — R7989 Other specified abnormal findings of blood chemistry: Secondary | ICD-10-CM

## 2017-06-19 DIAGNOSIS — G8929 Other chronic pain: Secondary | ICD-10-CM

## 2017-06-19 DIAGNOSIS — R945 Abnormal results of liver function studies: Principal | ICD-10-CM

## 2017-06-19 MED ORDER — RANITIDINE HCL 150 MG PO TABS: 150.0000 mg | ORAL_TABLET | Freq: Every day | ORAL | 3 refills | Status: DC

## 2017-06-19 MED ORDER — SUCRALFATE 1 G PO TABS: 1.0000 g | ORAL_TABLET | Freq: Three times a day (TID) | ORAL | 1 refills | Status: DC

## 2017-06-19 NOTE — Patient Instructions (Signed)
1. I will give you a couple boxes of samples of Dexilant. 2. We will check on the status of your patient assistance paperwork. 3. I send him Zantac to your pharmacy. Take this every evening before going to bed, on an empty stomach. 4. I sent him Carafate to your pharmacy. If you have breakthrough heartburn symptoms you can crush a small tablet mix it with a little bit of water and drink it. He can do this up to 4 times a day as needed. 5. Have your labs drawn when you're able to. 6. Return for follow-up in 2 months.

## 2017-06-19 NOTE — Progress Notes (Signed)
Referring Provider: Raiford Simmonds., PA-C Primary Care Physician:  Raiford Simmonds., PA-C Primary GI:  Dr. Gala Romney  Chief Complaint  Patient presents with  . Rectal Bleeding    HPI:   Rita [name] is a 51 y.o. female who presents for follow-up on rectal bleeding. The patient was last seen in our office 03/21/2017 for hiatal hernia with GERD, rectal bleeding, elevated liver enzymes. She was also due to scheduled 3 month follow-up EGD. Admitted to the hospital in January 2018 with epigastric pain with nausea and vomiting, bump in AST/ALT/alkaline phosphatase to 136/95/135, lipase of 375. Ultrasound and CT demonstrated mild dilated CBD without intrahepatic biliary dilation, thickened distal esophageal wall, pancreas appear normal. Patient does not drink, denies opioid therapy. Gallbladder removed remotely. Long-standing history of complicated GERD not on PPI January 2018. No prior colonoscopy. EUS in 2014 found normal pancreas and biliary tree done for similar symptoms which she's had for years. Previous esophageal biopsies with Barrett's esophagus but not seen on subsequent biopsies. MRCP done in January 2018 as well with mild hepatic steatosis, tiny hepatic simple cyst, no intrahepatic biliary dilation, CBD 8 mm, no filling defect in the common bile duct, pancreas normal. EGD 12/2016 with LA grade D esophagitis and subjective narrowing of the distal 5 cm of the tubular esophagus, could not evaluate for Barrett's or perform dilation, medium-sized hiatal hernia.  At her last visit she was having belching associated with regurgitation, daily heartburn, Dexilant help some but still with breakthrough symptoms but only taking Dexilant every 1-2 days due to no insurance and living on samples. Epigastric pain into right upper quadrant and jaw at least 3-5 times a day. Noted regular bowel movements. Some fresh blood per rectum on wiping but no melena. Was previously on Levsin due to questionable sphincter  of Oddi dysfunction but had not been referred to [facility] yet. Recommended repeat EGD with possible dilation, may require referral to [facility] for sphincter of Oddi dysfunction workup. Started patient assistance paperwork for Dexilant but was provided samples of Nexium 20 mg twice a day. Recommended colonoscopy on propofol/MAC.  Colonoscopy completed 04/17/2017 which found diverticulosis in the sigmoid and descending colon, a single 15 mm polyp at the hepatic flexure, a single 7 mm polyp at the hepatic flexure, internal hemorrhoids, otherwise normal. Surgical pathology found the polyps to be multiple fragments of tubular adenoma Recommended repeat colonoscopy in 3 years (2021). Repeat EGD completed the same day found improvement in esophagitis to LA grade B, abnormal distal esophagus status post dilation and biopsy, medium-size hiatal hernia, otherwise normal. Surgical pathology found the biopsies to be inflammation consistent with reflux recommended continue current medications.  Last CMP completed 01/02/2017 which had normal AST/ALT/alkaline phosphatase. Normal bilirubin. Last CBC ordered 04/14/2017 which does not appear to been completed. Last completed CBC on 12/31/2016 with a hemoglobin of 16.2.  Today she states she's not doing very well. She has had a small amount of bleeding about 1-2 times a week with a bowel movement. Occasional hard stools with straining, not on any medication for constipation. Sometimes she has sensation of needing to have a bowel movement and isn't able to go. GERD symptoms not well controlled but not currently on PPI, symptoms worse with with bending forward, laying flat, etc. Has breakthrough at least 4 times a week. Not able to eat much due to heartburn. Occasional diarrhea, worse when in [place] recently. Abdominal pain is in multiple locations; states the worst is in her right-sided back and  radiates around to RUQ/epigastric area and "feels like someone punching me in the  stomach." had a fever of 101.something 2 days ago. Admits intermittent chest pain, has not told your PCP. Denies dizziness, lightheadedness, syncope, near syncope. Denies any other upper or lower GI symptoms.  She is very confused about her medications. Initially stated she's only on levsin. Then asked for samples (which she states she gets from here; we do not have samples of Levsin). Stated the medicine she takes is in a green and white box, which is Dexilant. So it would seem she is on Dexilant, but not on levsin. Medications list updated. She ran out of Dexilant 2 days ago.  Recent imaging includes MRI/MRCP completed on 01/01/2017 which found no acute findings, mild hepatic steatosis, status post cholecystectomy with minimal, bile duct dilation compatible with benign process status post cholecystectomy, no evidence of choledocholithiasis. Abdominal ultrasound completed 12/31/2016 found no acute findings, status post cholecystectomy with mild chronic dilation of common bile duct, hepatic steatosis, pancreas not visualized due to midline gas.  Past Medical History:  Diagnosis Date  . Arthritis   . Barrett's esophagus with esophagitis 03/26/2013  . Chronic abdominal pain   . Chronic back pain   . GERD (gastroesophageal reflux disease)   . Heart murmur   . Hiatal hernia   . Pancreatitis   . Peptic ulcer   . Tobacco use 03/26/2013    Past Surgical History:  Procedure Laterality Date  . BIOPSY  04/17/2017   Procedure: BIOPSY;  Surgeon: Daneil Dolin, MD;  Location: [facility];  Service: Endoscopy;;  esophageal   . CESAREAN SECTION    . CHOLECYSTECTOMY    . COLONOSCOPY WITH PROPOFOL N/A 04/17/2017   Procedure: COLONOSCOPY WITH PROPOFOL;  Surgeon: Daneil Dolin, MD;  Location: [facility];  Service: Endoscopy;  Laterality: N/A;  100  . ESOPHAGOGASTRODUODENOSCOPY  Oct 2011   Dr. Laural Golden: ulcer in distal esophagus, soft stricture at GE junction s/p balloon dilation PATH: BARRETT'S  .  ESOPHAGOGASTRODUODENOSCOPY (EGD) WITH ESOPHAGEAL DILATION N/A 03/27/2013   Procedure: ESOPHAGOGASTRODUODENOSCOPY (EGD) WITH ESOPHAGEAL DILATION;  Surgeon: Daneil Dolin, MD;  [facility]: [facility];  Service: Endoscopy;  Laterality: N/A;  possible dilation  . ESOPHAGOGASTRODUODENOSCOPY (EGD) WITH PROPOFOL N/A 01/02/2017   Procedure: ESOPHAGOGASTRODUODENOSCOPY (EGD) WITH PROPOFOL;  Surgeon: Daneil Dolin, MD;  [facility]: [facility];  Service: Endoscopy;  Laterality: N/A;  with possible esophageal dilation  . ESOPHAGOGASTRODUODENOSCOPY (EGD) WITH PROPOFOL N/A 04/17/2017   Procedure: ESOPHAGOGASTRODUODENOSCOPY (EGD) WITH PROPOFOL;  Surgeon: Daneil Dolin, MD;  [facility]: [facility];  Service: Endoscopy;  Laterality: N/A;  . EUS N/A 05/09/2013   Procedure: UPPER ENDOSCOPIC ULTRASOUND (EUS) LINEAR;  Surgeon: Milus Banister, MD;  Location: [facility];  Service: Endoscopy;  Laterality: N/A;  Venia Minks DILATION N/A 04/17/2017   Procedure: Venia Minks DILATION;  Surgeon: Daneil Dolin, MD;  [facility]: [facility];  Service: Endoscopy;  Laterality: N/A;  . POLYPECTOMY  04/17/2017   Procedure: POLYPECTOMY;  Surgeon: Daneil Dolin, MD;  Location: [facility];  Service: Endoscopy;;  . TUBAL LIGATION      Current Outpatient Prescriptions  Medication Sig Dispense Refill  . hyoscyamine (LEVSIN SL) 0.125 MG SL tablet Place 1 tablet (0.125 mg total) under the tongue every 6 (six) hours as needed for cramping (abdominal pain). 30 tablet 0   No current facility-administered medications for this visit.     Allergies as of 06/19/2017  . (No Known Allergies)  Family History  Problem Relation Age of Onset  . Asthma Mother   . Heart failure Mother   . Cancer Mother   . Diabetes Mother   . Hypertension Mother   . Stroke Mother   . Pancreatic cancer Mother        deceased  . Heart failure Father   . Diabetes Father   . Colon cancer Neg Hx     Social History   Social History  . Marital status:  Single    Spouse name: N/A  . Number of children: N/A  . Years of education: N/A   Occupational History  . farm    Social History Main Topics  . Smoking status: Current Every Day Smoker    Packs/day: 0.25    Years: 20.00    Types: Cigarettes  . Smokeless tobacco: Never Used  . Alcohol use No  . Drug use: No  . Sexual activity: Not Currently    Birth control/ protection: Surgical   Other Topics Concern  . None   Social History Narrative  . None    Review of Systems: General: Negative for anorexia, weight loss, fever, chills, fatigue, weakness. ENT: Negative for hoarseness, difficulty swallowing. CV: Negative for chest pain, angina, palpitations, peripheral edema.  Respiratory: Negative for dyspnea at rest, cough, sputum, wheezing.  GI: See history of present illness. Endo: Negative for unusual weight change.  Heme: Negative for bruising or bleeding.   Physical Exam: BP (!) 143/83   Pulse 90   Temp 99.1 F (37.3 C) (Oral)   Ht _0  (1.702 m)   Wt 190 lb (86.2 kg)   LMP 02/09/2012   BMI 29.76 kg/m  General:   Alert and oriented. Pleasant and cooperative. Well-nourished and well-developed.  Eyes:  Without icterus, sclera clear and conjunctiva pink.  Ears:  Normal auditory acuity. Cardiovascular:  S1, S2 present without murmurs appreciated. Extremities without clubbing or edema. Respiratory:  Clear to auscultation bilaterally. No wheezes, rales, or rhonchi. No distress. Noted congestion and occasional cough (? URI) Gastrointestinal:  +BS, soft, and non-distended. Noted epigastric and RUQ TTP. No HSM noted. No guarding or rebound. No masses appreciated.  Rectal:  Deferred  Musculoskalatal:  Symmetrical without gross deformities. Neurologic:  Alert and oriented x4;  grossly normal neurologically. Psych:  Alert and cooperative. Normal mood and affect. Heme/Lymph/Immune: No excessive bruising noted.    06/19/2017 11:43 AM   Disclaimer: This note was dictated with  voice recognition software. Similar sounding words can inadvertently be transcribed and may not be corrected upon review.

## 2017-06-19 NOTE — Assessment & Plan Note (Signed)
The patient has chronic abdominal pain of the right upper quadrant and epigastric area. This is likely difficult to control GERD/gastritis as per above. Status post cholecystectomy. Common bile duct normal. Liver with mild steatosis. Further GERD management as per above. Return for follow-up in 2 months to check on symptom progression.

## 2017-06-19 NOTE — Progress Notes (Signed)
CC'D TO PCP °

## 2017-06-19 NOTE — Assessment & Plan Note (Signed)
The patient has some persistent intermittent rectal bleeding. EGD up-to-date as per history of present illness. Leading likely due to known internal hemorrhoids. I will check a CBC today to ensure there is no significant blood loss. Return for follow-up in 2 months.

## 2017-06-19 NOTE — Assessment & Plan Note (Signed)
Chronic difficult to manage GERD symptoms. Is on Dexilant, ran out 2 days ago. Has epigastric pain but subjectively and objectively on exam today. Has breakthrough symptoms about 4 times a day. At this point I will start her on Zantac 150 mg in the evenings. I will also provide for Carafate for breakthrough symptoms. I will have our staff check for status of patient assistance paperwork related to [place]. I will provide her with a very limited supply of samples in the meantime of Dexilant. Return for follow-up in 2 months.

## 2017-06-19 NOTE — Assessment & Plan Note (Signed)
History of elevated LFTs, most recent labs normal. I will recheck a CMP today. She may eventually need referral to [facility] for possible sphincter of Oddi dysfunction. Pancreas normal on abdominal imaging. Return for follow-up in 2 months.

## 2017-06-29 ENCOUNTER — Telehealth: Payer: Self-pay

## 2017-06-29 NOTE — Telephone Encounter (Signed)
Pt called- she is going for her disability hearing in October. She wants to know if we can write a letter stating what her GI problems are so she can take it with her when she goes. She said Dr.Rourk knows all about her medical conditions and her GI problems. dexilant doesn't help, the carafate helps for about an hour and then it doesn't help anymore. She said she cant bend over at all d/t her reflux. She said her disability lawyer is wanting her to get this letter.  Pt also said she has a very bad cough. She said it came out of nowhere and has lasted about 3 weeks. She said she will cough until she vomits, no runny nose, no fever. She cant sleep, has tried robitussin and nyquil with no help. She said it was a very dry cough that gets worse when she goes outside in the heat. I advised pt to call her pcp Linwood Dibbles Muse PA at the health de[facility]) and let them know about this before it gets worse. Pt said that she would.

## 2017-07-03 ENCOUNTER — Encounter: Payer: Self-pay | Admitting: Internal Medicine

## 2017-07-03 NOTE — Telephone Encounter (Signed)
Lets get info needed

## 2017-07-04 ENCOUNTER — Encounter: Payer: Self-pay | Admitting: General Practice

## 2017-07-04 NOTE — Telephone Encounter (Signed)
I tried to call the patient, however I was unable to reach her.  Letter placed in the mail

## 2017-07-19 ENCOUNTER — Encounter (HOSPITAL_COMMUNITY): Payer: Self-pay | Admitting: Emergency Medicine

## 2017-07-19 ENCOUNTER — Emergency Department (HOSPITAL_COMMUNITY)
Admission: EM | Admit: 2017-07-19 | Discharge: 2017-07-19 | Disposition: A | Discharge status: Home / Self Care | Payer: Self-pay | Attending: Emergency Medicine | Admitting: Emergency Medicine

## 2017-07-19 ENCOUNTER — Emergency Department (HOSPITAL_COMMUNITY): Payer: Self-pay

## 2017-07-19 DIAGNOSIS — Z79899 Other long term (current) drug therapy: Secondary | ICD-10-CM | POA: Insufficient documentation

## 2017-07-19 DIAGNOSIS — F17210 Nicotine dependence, cigarettes, uncomplicated: Secondary | ICD-10-CM | POA: Insufficient documentation

## 2017-07-19 DIAGNOSIS — R1010 Upper abdominal pain, unspecified: Secondary | ICD-10-CM

## 2017-07-19 DIAGNOSIS — R1013 Epigastric pain: Secondary | ICD-10-CM | POA: Insufficient documentation

## 2017-07-19 LAB — COMPREHENSIVE METABOLIC PANEL
ALT: 43 U/L (ref 14–54)
AST: 40 U/L (ref 15–41)
Albumin: 4.4 g/dL (ref 3.5–5.0)
Alkaline Phosphatase: 70 U/L (ref 38–126)
Anion gap: 13 (ref 5–15)
BUN: 11 mg/dL (ref 6–20)
CHLORIDE: 102 mmol/L (ref 101–111)
CO2: 24 mmol/L (ref 22–32)
CREATININE: 0.84 mg/dL (ref 0.44–1.00)
Calcium: 9.5 mg/dL (ref 8.9–10.3)
GFR calc Af Amer: >60 mL/min (ref >60)
GFR calc non Af Amer: >60 mL/min (ref >60)
Glucose, Bld: 150 mg/dL — ABNORMAL HIGH (ref 65–99)
POTASSIUM: 3.5 mmol/L (ref 3.5–5.1)
SODIUM: 139 mmol/L (ref 135–145)
Total Bilirubin: 0.5 mg/dL (ref 0.3–1.2)
Total Protein: 8.2 g/dL — ABNORMAL HIGH (ref 6.5–8.1)

## 2017-07-19 LAB — URINALYSIS, ROUTINE W REFLEX MICROSCOPIC
Bacteria, UA: NONE SEEN
Bilirubin Urine: NEGATIVE
GLUCOSE, UA: NEGATIVE mg/dL
KETONES UR: NEGATIVE mg/dL
LEUKOCYTES UA: NEGATIVE
Nitrite: NEGATIVE
PROTEIN: NEGATIVE mg/dL
Specific Gravity, Urine: 1.017 (ref 1.005–1.030)
pH: 9 — ABNORMAL HIGH (ref 5.0–8.0)

## 2017-07-19 LAB — CBC WITH DIFFERENTIAL/PLATELET
BASOS ABS: 0 10*3/uL (ref 0.0–0.1)
BASOS PCT: 0 %
EOS ABS: 0.1 10*3/uL (ref 0.0–0.7)
EOS PCT: 1 %
HCT: 42.7 % (ref 36.0–46.0)
Hemoglobin: 14.9 g/dL (ref 12.0–15.0)
Lymphocytes Relative: 19 %
Lymphs Abs: 2.5 10*3/uL (ref 0.7–4.0)
MCH: 30.2 pg (ref 26.0–34.0)
MCHC: 34.9 g/dL (ref 30.0–36.0)
MCV: 86.6 fL (ref 78.0–100.0)
Monocytes Absolute: 0.6 10*3/uL (ref 0.1–1.0)
Monocytes Relative: 5 %
Neutro Abs: 9.7 10*3/uL — ABNORMAL HIGH (ref 1.7–7.7)
Neutrophils Relative %: 75 %
PLATELETS: 411 10*3/uL — AB (ref 150–400)
RBC: 4.93 MIL/uL (ref 3.87–5.11)
RDW: 14.1 % (ref 11.5–15.5)
WBC: 12.9 10*3/uL — AB (ref 4.0–10.5)

## 2017-07-19 LAB — LIPASE, BLOOD: Lipase: 56 U/L — ABNORMAL HIGH (ref 11–51)

## 2017-07-19 MED ORDER — PROMETHAZINE HCL 25 MG RE SUPP: 25.0000 mg | Freq: Four times a day (QID) | RECTAL | 1 refills | Status: DC | PRN

## 2017-07-19 MED ORDER — METOCLOPRAMIDE HCL 5 MG/ML IJ SOLN
INTRAMUSCULAR | Status: AC
  Filled 2017-07-19: qty 2

## 2017-07-19 MED ORDER — PANTOPRAZOLE SODIUM 40 MG IV SOLR
40.0000 mg | Freq: Once | INTRAVENOUS | Status: AC
  Administered 2017-07-19: 40 mg via INTRAVENOUS
  Filled 2017-07-19: qty 40

## 2017-07-19 MED ORDER — HYDROMORPHONE HCL 1 MG/ML IJ SOLN
1.0000 mg | Freq: Once | INTRAMUSCULAR | Status: AC
  Administered 2017-07-19: 1 mg via INTRAVENOUS
  Filled 2017-07-19: qty 1

## 2017-07-19 MED ORDER — ONDANSETRON HCL 4 MG/2ML IJ SOLN
4.0000 mg | Freq: Once | INTRAMUSCULAR | Status: AC
  Administered 2017-07-19: 4 mg via INTRAVENOUS
  Filled 2017-07-19: qty 2

## 2017-07-19 MED ORDER — HYDROCODONE-ACETAMINOPHEN 5-325 MG PO TABS: 1.0000 | ORAL_TABLET | Freq: Four times a day (QID) | ORAL | 0 refills | Status: DC | PRN

## 2017-07-19 MED ORDER — IOPAMIDOL (ISOVUE-300) INJECTION 61%
100.0000 mL | Freq: Once | INTRAVENOUS | Status: AC | PRN
  Administered 2017-07-19: 100 mL via INTRAVENOUS

## 2017-07-19 MED ORDER — METOCLOPRAMIDE HCL 5 MG/ML IJ SOLN
10.0000 mg | Freq: Once | INTRAMUSCULAR | Status: AC
  Administered 2017-07-19: 10 mg via INTRAVENOUS

## 2017-07-19 MED ORDER — METOCLOPRAMIDE HCL 10 MG PO TABS: 10.0000 mg | ORAL_TABLET | Freq: Once | ORAL | Status: DC

## 2017-07-19 MED ORDER — SODIUM CHLORIDE 0.9 % IV BOLUS (SEPSIS)
2000.0000 mL | Freq: Once | INTRAVENOUS | Status: AC
  Administered 2017-07-19: 2000 mL via INTRAVENOUS

## 2017-07-19 NOTE — ED Provider Notes (Signed)
[facility] DEPT Provider Note   CSN: [id number] Arrival date & time: 07/19/17  1224     History   Chief Complaint Chief Complaint  Patient presents with  . Emesis  . Abdominal Pain    HPI Rita [name] is a 51 y.o. female.  Patient complains of abdominal pain nausea vomiting. Patient has severe GERD problems and has had this worked up many times before.   The history is provided by the patient. No language interpreter was used.  Emesis   This is a recurrent problem. The current episode started yesterday. The problem occurs 5 to 10 times per day. The problem has not changed since onset.The emesis has an appearance of stomach contents. There has been no fever. Associated symptoms include abdominal pain. Pertinent negatives include no cough, no diarrhea, no fever and no headaches.  Abdominal Pain   The pain is located in the epigastric region. The pain is at a severity of 5/10. The pain is moderate. Associated symptoms include vomiting. Pertinent negatives include fever, diarrhea, frequency, hematuria and headaches.    Past Medical History:  Diagnosis Date  . Arthritis   . Barrett's esophagus with esophagitis 03/26/2013  . Chronic abdominal pain   . Chronic back pain   . GERD (gastroesophageal reflux disease)   . Heart murmur   . Hiatal hernia   . Pancreatitis   . Peptic ulcer   . Tobacco use 03/26/2013    Patient Active Problem List   Diagnosis Date Noted  . Rectal bleeding 03/21/2017  . Hiatal hernia with gastroesophageal reflux disease and esophagitis   . Biliary pain   . Pain of upper abdomen   . Elevated liver enzymes   . Chronic abdominal pain   . Pancreatitis 02/06/2015  . Acute pancreatitis   . Elevated LFTs   . Pancreatitis, acute 05/09/2013  . Hypokalemia 03/27/2013  . Anemia 03/27/2013  . Esophageal dysphagia 03/26/2013  . Chronic back pain 03/26/2013  . GERD (gastroesophageal reflux disease) 03/26/2013  . Tobacco use 03/26/2013  . Barrett's  esophagus with esophagitis 03/26/2013  . Elevated lipase 08/20/2011    Past Surgical History:  Procedure Laterality Date  . BIOPSY  04/17/2017   Procedure: BIOPSY;  Surgeon: Daneil Dolin, MD;  Location: [facility];  Service: Endoscopy;;  esophageal   . CESAREAN SECTION    . CHOLECYSTECTOMY    . COLONOSCOPY WITH PROPOFOL N/A 04/17/2017   Procedure: COLONOSCOPY WITH PROPOFOL;  Surgeon: Daneil Dolin, MD;  Location: [facility];  Service: Endoscopy;  Laterality: N/A;  100  . ESOPHAGOGASTRODUODENOSCOPY  Oct 2011   Dr. Laural Golden: ulcer in distal esophagus, soft stricture at GE junction s/p balloon dilation PATH: BARRETT'S  . ESOPHAGOGASTRODUODENOSCOPY (EGD) WITH ESOPHAGEAL DILATION N/A 03/27/2013   Procedure: ESOPHAGOGASTRODUODENOSCOPY (EGD) WITH ESOPHAGEAL DILATION;  Surgeon: Daneil Dolin, MD;  [facility]: [facility];  Service: Endoscopy;  Laterality: N/A;  possible dilation  . ESOPHAGOGASTRODUODENOSCOPY (EGD) WITH PROPOFOL N/A 01/02/2017   Procedure: ESOPHAGOGASTRODUODENOSCOPY (EGD) WITH PROPOFOL;  Surgeon: Daneil Dolin, MD;  [facility]: [facility];  Service: Endoscopy;  Laterality: N/A;  with possible esophageal dilation  . ESOPHAGOGASTRODUODENOSCOPY (EGD) WITH PROPOFOL N/A 04/17/2017   Procedure: ESOPHAGOGASTRODUODENOSCOPY (EGD) WITH PROPOFOL;  Surgeon: Daneil Dolin, MD;  [facility]: [facility];  Service: Endoscopy;  Laterality: N/A;  . EUS N/A 05/09/2013   Procedure: UPPER ENDOSCOPIC ULTRASOUND (EUS) LINEAR;  Surgeon: Milus Banister, MD;  Location: [facility];  Service: Endoscopy;  Laterality: N/A;  .  MALONEY DILATION N/A 04/17/2017   Procedure: Venia Minks DILATION;  Surgeon: Daneil Dolin, MD;  [facility]: [facility];  Service: Endoscopy;  Laterality: N/A;  . POLYPECTOMY  04/17/2017   Procedure: POLYPECTOMY;  Surgeon: Daneil Dolin, MD;  [facility]: [facility];  Service: Endoscopy;;  . TUBAL LIGATION      OB History    Gravida Para Term Preterm AB Living   4 3 3   1 3    SAB  TAB Ectopic Multiple Live Births   1               Home Medications    Prior to Admission medications   Medication Sig Start Date End Date Taking? Authorizing Provider  dexlansoprazole (DEXILANT) 60 MG capsule Take 60 mg by mouth daily.    [provider]  HYDROcodone-acetaminophen (NORCO/VICODIN) 5-325 MG tablet Take 1 tablet by mouth every 6 (six) hours as needed for moderate pain. 07/19/17   Milton Ferguson, MD  promethazine (PHENERGAN) 25 MG suppository Place 1 suppository (25 mg total) rectally every 6 (six) hours as needed for nausea or vomiting. 07/19/17   Milton Ferguson, MD  ranitidine (ZANTAC) 150 MG tablet Take 1 tablet (150 mg total) by mouth at bedtime. 06/19/17   Carlis Stable, NP  sucralfate (CARAFATE) 1 g tablet Take 1 tablet (1 g total) by mouth 4 (four) times daily -  with meals and at bedtime. 06/19/17   Carlis Stable, NP    Family History Family History  Problem Relation Age of Onset  . Asthma Mother   . Heart failure Mother   . Cancer Mother   . Diabetes Mother   . Hypertension Mother   . Stroke Mother   . Pancreatic cancer Mother        deceased  . Heart failure Father   . Diabetes Father   . Colon cancer Neg Hx     Social History Social History  Substance Use Topics  . Smoking status: Current Every Day Smoker    Packs/day: 0.25    Years: 20.00    Types: Cigarettes  . Smokeless tobacco: Never Used  . Alcohol use No     Allergies   Patient has no known allergies.   Review of Systems Review of Systems  Constitutional: Negative for appetite change, fatigue and fever.  HENT: Negative for congestion, ear discharge and sinus pressure.   Eyes: Negative for discharge.  Respiratory: Negative for cough.   Cardiovascular: Negative for chest pain.  Gastrointestinal: Positive for abdominal pain and vomiting. Negative for diarrhea.  Genitourinary: Negative for frequency and hematuria.  Musculoskeletal: Negative for back pain.  Skin: Negative for  rash.  Neurological: Negative for seizures and headaches.  Psychiatric/Behavioral: Negative for hallucinations.     Physical Exam Updated Vital Signs BP (!) 182/95   Pulse 69   Temp (!) 97.5 F (36.4 C) (Oral)   Resp (!) 22   Ht 5\' 7"  (1.702 m)   Wt 86.2 kg (190 lb)   LMP 02/09/2012   SpO2 100%   BMI 29.76 kg/m   Physical Exam  Constitutional: She is oriented to person, place, and time. She appears well-developed.  HENT:  Head: Normocephalic.  Eyes: Conjunctivae and EOM are normal. No scleral icterus.  Neck: Neck supple. No thyromegaly present.  Cardiovascular: Normal rate and regular rhythm.  Exam reveals no gallop and no friction rub.   No murmur heard. Pulmonary/Chest: No stridor. She has no wheezes. She has no rales. She  exhibits no tenderness.  Abdominal: She exhibits no distension. There is tenderness. There is no rebound.  Musculoskeletal: Normal range of motion. She exhibits no edema.  Lymphadenopathy:    She has no cervical adenopathy.  Neurological: She is oriented to person, place, and time. She exhibits normal muscle tone. Coordination normal.  Skin: No rash noted. No erythema.  Psychiatric: She has a normal mood and affect. Her behavior is normal.     ED Treatments / Results  Labs (all labs ordered are listed, but only abnormal results are displayed) Labs Reviewed  CBC WITH DIFFERENTIAL/PLATELET - Abnormal; Notable for the following:       Result Value   WBC 12.9 (*)    Platelets 411 (*)    Neutro Abs 9.7 (*)    All other components within normal limits  COMPREHENSIVE METABOLIC PANEL - Abnormal; Notable for the following:    Glucose, Bld 150 (*)    Total Protein 8.2 (*)    All other components within normal limits  LIPASE, BLOOD - Abnormal; Notable for the following:    Lipase 56 (*)    All other components within normal limits  URINALYSIS, ROUTINE W REFLEX MICROSCOPIC    EKG  EKG Interpretation None       Radiology Ct Abdomen Pelvis W  Contrast  Result Date: 07/19/2017 CLINICAL DATA:  51 year old female with nausea, and vomiting. Past history of pancreatitis. EXAM: CT ABDOMEN AND PELVIS WITH CONTRAST TECHNIQUE: Multidetector CT imaging of the abdomen and pelvis was performed using the standard protocol following bolus administration of intravenous contrast. CONTRAST:  163mL ISOVUE-300 IOPAMIDOL (ISOVUE-300) INJECTION 61% COMPARISON:  Most recent prior CT abdomen/pelvis 12/30/2016; prior abdominal MRI 01/01/2017 FINDINGS: Lower chest: Mild circumferential thickening of the distal thoracic esophagus is similar to slightly improved compared 12/30/2016. The visualized cardiac structures are normal in size. No pericardial effusion. Stable small hiatal hernia. The visualized lower lungs are clear. Hepatobiliary: Diffuse low attenuation of the hepatic parenchyma consistent with hepatic steatosis. Small cyst in hepatic segment 5 as seen on prior imaging. No new lesion identified. The gallbladder is surgically absent. No significant intrahepatic biliary ductal dilatation. Pancreas: Unremarkable. No pancreatic ductal dilatation or surrounding inflammatory changes. Spleen: Normal in size without focal abnormality. Adrenals/Urinary Tract: Normal bilateral adrenal glands. No hydronephrosis or nephrolithiasis. No enhancing renal mass. Bilateral simple renal cysts without significant interval change. Unremarkable in ureter bladder. Stomach/Bowel: No evidence of obstruction or focal bowel wall thickening. Normal appendix in the right lower quadrant. The terminal ileum is unremarkable. Vascular/Lymphatic: Atherosclerotic vascular calcifications. No aneurysm. No suspicious adenopathy. Reproductive: Uterus and bilateral adnexa are unremarkable. Other: No abdominal wall hernia or abnormality. No abdominopelvic ascites. Musculoskeletal: No acute fracture or aggressive appearing lytic or blastic osseous lesion. Stable sclerotic focus in the anterior aspect of the  left acetabulum, likely a benign bone island. IMPRESSION: 1. No acute abnormality on today's examination. 2. Slightly improved distal esophageal wall thickening compared to January 2018. The patient has a known history of esophagitis. 3. Small hiatal hernia. 4. Hepatic steatosis. 5. Renal and hepatic simple cyst noted incidentally. 6.  Aortic Atherosclerosis (ICD10-170.0). Electronically Signed   By: Jacqulynn Cadet M.D.   On: 07/19/2017 14:53    Procedures Procedures (including critical care time)  Medications Ordered in ED Medications  metoCLOPramide (REGLAN) 5 MG/ML injection (not administered)  sodium chloride 0.9 % bolus 2,000 mL (2,000 mLs Intravenous New Bag/Given 07/19/17 1312)  pantoprazole (PROTONIX) injection 40 mg (40 mg Intravenous Given 07/19/17 1312)  ondansetron (  ZOFRAN) injection 4 mg (4 mg Intravenous Given 07/19/17 1312)  HYDROmorphone (DILAUDID) injection 1 mg (1 mg Intravenous Given 07/19/17 1311)  ondansetron (ZOFRAN) injection 4 mg (4 mg Intravenous Given 07/19/17 1410)  iopamidol (ISOVUE-300) 61 % injection 100 mL (100 mLs Intravenous Contrast Given 07/19/17 1424)  HYDROmorphone (DILAUDID) injection 1 mg (1 mg Intravenous Given 07/19/17 1449)  metoCLOPramide (REGLAN) injection 10 mg (10 mg Intravenous Given 07/19/17 1450)     Initial Impression / Assessment and Plan / ED Course  I have reviewed the triage vital signs and the nursing notes.  Pertinent labs & imaging results that were available during my care of the patient were reviewed by me and considered in my medical decision making (see chart for details).     CT scan does not show any acute changes. I suspect this is an exacerbation of her chronic esophagitis and peptic ulcer problems. Patient will increase her Carafate to 4 times a day. She is also given some Phenergan and Vicodin and will follow-up with her GI doctor  Final Clinical Impressions(s) / ED Diagnoses   Final diagnoses:  Pain of upper abdomen     New Prescriptions New Prescriptions   HYDROCODONE-ACETAMINOPHEN (NORCO/VICODIN) 5-325 MG TABLET    Take 1 tablet by mouth every 6 (six) hours as needed for moderate pain.   PROMETHAZINE (PHENERGAN) 25 MG SUPPOSITORY    Place 1 suppository (25 mg total) rectally every 6 (six) hours as needed for nausea or vomiting.     Milton Ferguson, MD 07/19/17 [phone]

## 2017-07-19 NOTE — Discharge Instructions (Signed)
Increase your Carafate to 4 times a day. Follow-up with her GI doctor next week

## 2017-07-19 NOTE — ED Triage Notes (Signed)
Pt reports onset of nausea and vomiting this morning.  States "this happens all of the time."  Moaning and not answering all questions at triage.

## 2017-08-15 ENCOUNTER — Telehealth: Payer: Self-pay | Admitting: Internal Medicine

## 2017-08-15 NOTE — Telephone Encounter (Signed)
Please call patient, she has some questions before her appt next week.

## 2017-08-16 NOTE — Telephone Encounter (Signed)
Tried to call pt- gentleman answered the phone and said she was not with him at the moment and he would have her call back.

## 2017-08-17 NOTE — Telephone Encounter (Signed)
Tried to call- NA 

## 2017-08-18 ENCOUNTER — Ambulatory Visit: Payer: Self-pay | Admitting: Internal Medicine

## 2017-08-21 NOTE — Telephone Encounter (Signed)
Tried to call pt- NA- pt has ov tomorrow with [facility].

## 2017-08-22 ENCOUNTER — Ambulatory Visit (INDEPENDENT_AMBULATORY_CARE_PROVIDER_SITE_OTHER): Payer: Self-pay | Admitting: Internal Medicine

## 2017-08-22 ENCOUNTER — Encounter: Payer: Self-pay | Admitting: Internal Medicine

## 2017-08-22 ENCOUNTER — Other Ambulatory Visit: Payer: Self-pay

## 2017-08-22 VITALS — BP 136/77 | HR 98 | Temp 97.5°F | Ht 66.0 in | Wt 190.6 lb

## 2017-08-22 DIAGNOSIS — K2100 Gastro-esophageal reflux disease with esophagitis, without bleeding: Secondary | ICD-10-CM

## 2017-08-22 DIAGNOSIS — R112 Nausea with vomiting, unspecified: Secondary | ICD-10-CM

## 2017-08-22 DIAGNOSIS — R110 Nausea: Secondary | ICD-10-CM

## 2017-08-22 NOTE — Patient Instructions (Addendum)
GERD information provided  Continue Dexilant 60 mg or nexium (take at least 40 mg daily)  Take 2 pepsid complete at bedtime  Keep head of bed elevated  Solid phase gastric emptying study (chronic nausea and regurgitation  Office visit in 3 months  Try and stop smoking

## 2017-08-22 NOTE — Progress Notes (Signed)
Primary Care Physician:  Raiford Simmonds., PA-C Primary Gastroenterologist:  Dr. Gala Romney  Pre-Procedure History & Physical: HPI:  Rita [name] is a 51 y.o. female here for  follow-up of GERD, nausea, upper abdominal /   chestl pain. Symptoms ongoing for quite a while. Takes Dexilant 60 mg daily when she has access to it.  Uses Nexium OTC as replacement.  no dysphagia. Lot of nocturnal reflux and regurgitation. Constant burning. Intermittent regurgitation of at night.. No melena, hematochezia, constipation or diarrhea. Recent colonoscopy demonstrated adenomas; due for surveillance 2021. Recent EGD demonstrated erosive reflux esophagitis;  no Barrett's on biopsies. No peptic ulcer disease. She stopped taking hydrocodone entirely. Did not think Levsin helped at all.  EGD suspicious for Barrett's;  biopsies negative.  She continues to smoke.  She has not had a gastric emptying study. Gallbladder out. MRCP demonstrated only a mildly dilated bile duct without any other significant abnormalities.  Past Medical History:  Diagnosis Date  . Arthritis   . Barrett's esophagus with esophagitis 03/26/2013  . Chronic abdominal pain   . Chronic back pain   . GERD (gastroesophageal reflux disease)   . Heart murmur   . Hiatal hernia   . Pancreatitis   . Peptic ulcer   . Tobacco use 03/26/2013    Past Surgical History:  Procedure Laterality Date  . BIOPSY  04/17/2017   Procedure: BIOPSY;  Surgeon: Daneil Dolin, MD;  Location: [facility];  Service: Endoscopy;;  esophageal   . CESAREAN SECTION    . CHOLECYSTECTOMY    . COLONOSCOPY WITH PROPOFOL N/A 04/17/2017   Procedure: COLONOSCOPY WITH PROPOFOL;  Surgeon: Daneil Dolin, MD;  [facility]: [facility];  Service: Endoscopy;  Laterality: N/A;  100  . ESOPHAGOGASTRODUODENOSCOPY  Oct 2011   Dr. Laural Golden: ulcer in distal esophagus, soft stricture at GE junction s/p balloon dilation PATH: BARRETT'S  . ESOPHAGOGASTRODUODENOSCOPY (EGD) WITH ESOPHAGEAL  DILATION N/A 03/27/2013   Procedure: ESOPHAGOGASTRODUODENOSCOPY (EGD) WITH ESOPHAGEAL DILATION;  Surgeon: Daneil Dolin, MD;  [facility]: [facility];  Service: Endoscopy;  Laterality: N/A;  possible dilation  . ESOPHAGOGASTRODUODENOSCOPY (EGD) WITH PROPOFOL N/A 01/02/2017   Procedure: ESOPHAGOGASTRODUODENOSCOPY (EGD) WITH PROPOFOL;  Surgeon: Daneil Dolin, MD;  [facility];  Service: Endoscopy;  Laterality: N/A;  with possible esophageal dilation  . ESOPHAGOGASTRODUODENOSCOPY (EGD) WITH PROPOFOL N/A 04/17/2017   Procedure: ESOPHAGOGASTRODUODENOSCOPY (EGD) WITH PROPOFOL;  Surgeon: Daneil Dolin, MD;  [facility]: [facility];  Service: Endoscopy;  Laterality: N/A;  . EUS N/A 05/09/2013   Procedure: UPPER ENDOSCOPIC ULTRASOUND (EUS) LINEAR;  Surgeon: Milus Banister, MD;  Location: [facility];  Service: Endoscopy;  Laterality: N/A;  Venia Minks DILATION N/A 04/17/2017   Procedure: Venia Minks DILATION;  Surgeon: Daneil Dolin, MD;  [facility];  Service: Endoscopy;  Laterality: N/A;  . POLYPECTOMY  04/17/2017   Procedure: POLYPECTOMY;  Surgeon: Daneil Dolin, MD;  [facility]: [facility];  Service: Endoscopy;;  . TUBAL LIGATION      Prior to Admission medications   Medication Sig Start Date End Date Taking? Authorizing Provider  dexlansoprazole (DEXILANT) 60 MG capsule Take 60 mg by mouth daily.   Yes [provider]  esomeprazole (NEXIUM) 20 MG capsule Take 20 mg by mouth daily. Sometimes takes 2-3   Yes [provider]  HYDROcodone-acetaminophen (NORCO/VICODIN) 5-325 MG tablet Take 1 tablet by mouth every 6 (six) hours as needed for moderate pain. 07/19/17  Yes Milton Ferguson,  MD  promethazine (PHENERGAN) 25 MG suppository Place 1 suppository (25 mg total) rectally every 6 (six) hours as needed for nausea or vomiting. 07/19/17  Yes Milton Ferguson, MD  sucralfate (CARAFATE) 1 g tablet Take 1 tablet (1 g total) by mouth 4 (four) times daily -  with meals and at  bedtime. Patient taking differently: Take 1 g by mouth 4 (four) times daily -  with meals and at bedtime. Doesn't take everyday 06/19/17  Yes Carlis Stable, NP  ranitidine (ZANTAC) 150 MG tablet Take 1 tablet (150 mg total) by mouth at bedtime. Patient not taking: Reported on 08/22/2017 06/19/17   Carlis Stable, NP    Allergies as of 08/22/2017  . (No Known Allergies)    Family History  Problem Relation Age of Onset  . Asthma Mother   . Heart failure Mother   . Cancer Mother   . Diabetes Mother   . Hypertension Mother   . Stroke Mother   . Pancreatic cancer Mother        deceased  . Heart failure Father   . Diabetes Father   . Colon cancer Neg Hx     Social History   Social History  . Marital status: Single    Spouse name: N/A  . Number of children: N/A  . Years of education: N/A   Occupational History  . farm    Social History Main Topics  . Smoking status: Current Every Day Smoker    Packs/day: 0.25    Years: 20.00    Types: Cigarettes  . Smokeless tobacco: Never Used  . Alcohol use No  . Drug use: No  . Sexual activity: Not Currently    Birth control/ protection: Surgical   Other Topics Concern  . Not on file   Social History Narrative  . No narrative on file    Review of Systems: See HPI, otherwise negative ROS  Physical Exam: BP 136/77   Pulse 98   Temp (!) 97.5 F (36.4 C) (Oral)   Ht 5\' 6"  (1.676 m)   Wt 190 lb 9.6 oz (86.5 kg)   LMP 02/09/2012   BMI 30.76 kg/m  General:   Alert,  , pleasant and cooperative in NAD Neck:  Supple; no masses or thyromegaly. No significant cervical adenopathy. Lungs:  Clear throughout to auscultation.   No wheezes, crackles, or rhonchi. No acute distress. Heart:  Regular rate and rhythm; no murmurs, clicks, rubs,  or gallops. Abdomen: Non-distended, normal bowel sounds.  Soft and nontender without appreciable mass or hepatosplenomegaly.  Pulses:  Normal pulses noted. Extremities:  Without clubbing or  edema.  Impression/:  Refractory symptoms of reflux/regurgitation/nausea. She does not stay on a standard PPI regimen consistently as she does not have the financial means to procure Dexilant.  The possibility of gastroparesis needs to be considered here. She needs to stop smoking.  Recommendations:  GERD information provided  Continue Dexilant 60 mg or nexium (take at least 40 mg daily)  Take 2 pepsid complete at bedtime  Keep head of bed elevated  Solid phase gastric emptying study (chronic nausea and regurgitation  Office visit in 3 months  Try and stop smoking    Notice: This dictation was prepared with Dragon dictation along with smaller phrase technology. Any transcriptional errors that result from this process are unintentional and may not be corrected upon review.

## 2017-09-01 ENCOUNTER — Telehealth: Payer: Self-pay | Admitting: Internal Medicine

## 2017-09-01 ENCOUNTER — Encounter (HOSPITAL_COMMUNITY): Payer: Self-pay

## 2017-09-01 ENCOUNTER — Encounter (HOSPITAL_COMMUNITY)
Admission: RE | Admit: 2017-09-01 | Discharge: 2017-09-01 | Disposition: A | Discharge status: Home / Self Care | Payer: Self-pay | Source: Ambulatory Visit | Attending: Internal Medicine | Admitting: Internal Medicine

## 2017-09-01 DIAGNOSIS — R112 Nausea with vomiting, unspecified: Secondary | ICD-10-CM | POA: Insufficient documentation

## 2017-09-01 MED ORDER — TECHNETIUM TC 99M SULFUR COLLOID
2.0000 | Freq: Once | INTRAVENOUS | Status: AC | PRN
  Administered 2017-09-01: 2.3 via ORAL

## 2017-09-01 NOTE — Telephone Encounter (Signed)
I spoke with the pt, she never got her letter from [employer]. I have printed it and she will come by on Monday and pick it up.

## 2017-09-01 NOTE — Telephone Encounter (Signed)
Please call patient after 65 , she has questions about her disability papers  [phone]

## 2017-09-12 ENCOUNTER — Other Ambulatory Visit: Payer: Self-pay | Admitting: Internal Medicine

## 2017-09-12 MED ORDER — METOCLOPRAMIDE HCL 5 MG PO TABS: 5.0000 mg | ORAL_TABLET | Freq: Three times a day (TID) | ORAL | 0 refills | Status: DC

## 2017-09-28 ENCOUNTER — Encounter: Payer: Self-pay | Admitting: Internal Medicine

## 2017-10-05 ENCOUNTER — Telehealth: Payer: Self-pay | Admitting: Internal Medicine

## 2017-10-05 NOTE — Telephone Encounter (Signed)
Pt called to make her 3 month fu ov and is aware of date and time. She is asking if she can get samples of Dexilant and doesn't remember the strength of it.  Please advise. [phone]

## 2017-10-05 NOTE — Telephone Encounter (Signed)
Samples are at the front desk. Please let her know.

## 2017-10-05 NOTE — Telephone Encounter (Signed)
LMOM

## 2017-10-26 ENCOUNTER — Telehealth: Payer: Self-pay

## 2017-10-26 NOTE — Telephone Encounter (Signed)
Received a fax from the patient assistance co for dexilant. They need income documentation before they can approve it. The pt has an appt next week. I need her to bring that info with her when she comes. Tried to call the pt, a man answered and said he wasn't with her at the moment but will have her call me.

## 2017-10-30 NOTE — Telephone Encounter (Signed)
Pt has ov tomorrow.

## 2017-10-30 NOTE — Telephone Encounter (Signed)
Pt called- she is aware of ov tomorrow and she is aware of the time. She does not have any income, she is currently trying to get disability. The dexilant pt assistance company will not take a letter from the patient or the pt family. They will only take a letter from the doctors office.  Magda Paganini, since you filled out the paper work, will you do a letter for her to send to pt assistance?

## 2017-10-31 ENCOUNTER — Ambulatory Visit (INDEPENDENT_AMBULATORY_CARE_PROVIDER_SITE_OTHER): Payer: Self-pay | Admitting: Internal Medicine

## 2017-10-31 ENCOUNTER — Encounter: Payer: Self-pay | Admitting: Internal Medicine

## 2017-10-31 VITALS — BP 149/88 | HR 70 | Temp 98.3°F | Ht 67.0 in | Wt 190.2 lb

## 2017-10-31 DIAGNOSIS — K3184 Gastroparesis: Secondary | ICD-10-CM

## 2017-10-31 DIAGNOSIS — K5909 Other constipation: Secondary | ICD-10-CM

## 2017-10-31 DIAGNOSIS — K2100 Gastro-esophageal reflux disease with esophagitis, without bleeding: Secondary | ICD-10-CM

## 2017-10-31 NOTE — Patient Instructions (Addendum)
Continue Dexilant 60 mg daily  Use Carafate / Ranitidine as needed for break-through symptoms  Try Reglan as prescribed previously -discussed side effects  Use Miralax 17 grams at bedtime as needed for constipation  Office visit in 3 months

## 2017-10-31 NOTE — Progress Notes (Signed)
Primary Care Physician:  Patient, No Pcp Per Primary Gastroenterologist:  Dr. Gala Romney  Pre-Procedure History & Physical: HPI:  Rita [name] is a 51 y.o. female here for follow-up of GERD/reflux esophagitis and gastroparesis. By patient report, due to convoluted breakdown in communication patient did not start Reglan as previously recommended. She is also not been on DEXA lot with any regularity. Just got Medicaid/disability. Still no PCP continues to smoke. Continues to have symptoms of delayed gastric emptying and reflux. She is chronically constipated.  Has taken ranitidine and Carafate for breakthrough symptoms   Past Medical History:  Diagnosis Date  . Arthritis   . Barrett's esophagus with esophagitis 03/26/2013  . Chronic abdominal pain   . Chronic back pain   . GERD (gastroesophageal reflux disease)   . Heart murmur   . Hiatal hernia   . Pancreatitis   . Peptic ulcer   . Tobacco use 03/26/2013    Past Surgical History:  Procedure Laterality Date  . CESAREAN SECTION    . CHOLECYSTECTOMY    . ESOPHAGOGASTRODUODENOSCOPY  Oct 2011   Dr. Laural Golden: ulcer in distal esophagus, soft stricture at GE junction s/p balloon dilation PATH: BARRETT'S  . TUBAL LIGATION      Prior to Admission medications   Medication Sig Start Date End Date Taking? Authorizing Provider  dexlansoprazole (DEXILANT) 60 MG capsule Take 60 mg by mouth daily.   Yes [provider]  esomeprazole (NEXIUM) 20 MG capsule Take 20 mg by mouth daily. Sometimes takes 2-3   Yes [provider]  ranitidine (ZANTAC) 150 MG tablet Take 1 tablet (150 mg total) by mouth at bedtime. Patient taking differently: Take 150 mg as needed by mouth.  06/19/17  Yes Carlis Stable, NP  sucralfate (CARAFATE) 1 g tablet Take 1 tablet (1 g total) by mouth 4 (four) times daily -  with meals and at bedtime. Patient taking differently: Take 1 g 4 (four) times daily -  with meals and at bedtime by mouth. Doesn't take everyday.  Sometimes takes once a day 06/19/17  Yes Carlis Stable, NP  HYDROcodone-acetaminophen (NORCO/VICODIN) 5-325 MG tablet Take 1 tablet by mouth every 6 (six) hours as needed for moderate pain. Patient not taking: Reported on 10/31/2017 07/19/17   Milton Ferguson, MD  metoCLOPramide (REGLAN) 5 MG tablet Take 1 tablet (5 mg total) by mouth 4 (four) times daily -  before meals and at bedtime. Patient not taking: Reported on 10/31/2017 09/12/17   Charla Criscione, Cristopher Estimable, MD  promethazine (PHENERGAN) 25 MG suppository Place 1 suppository (25 mg total) rectally every 6 (six) hours as needed for nausea or vomiting. Patient not taking: Reported on 10/31/2017 07/19/17   Milton Ferguson, MD    Allergies as of 10/31/2017  . (No Known Allergies)    Family History  Problem Relation Age of Onset  . Asthma Mother   . Heart failure Mother   . Cancer Mother   . Diabetes Mother   . Hypertension Mother   . Stroke Mother   . Pancreatic cancer Mother        deceased  . Heart failure Father   . Diabetes Father   . Colon cancer Neg Hx     Social History   Socioeconomic History  . Marital status: Single    Spouse name: Not on file  . Number of children: Not on file  . Years of education: Not on file  . Highest education level: Not on file  Social  Needs  . Financial resource strain: Not on file  . Food insecurity - worry: Not on file  . Food insecurity - inability: Not on file  . Transportation needs - medical: Not on file  . Transportation needs - non-medical: Not on file  Occupational History  . Occupation: farm  Tobacco Use  . Smoking status: Current Every Day Smoker    Packs/day: 0.25    Years: 20.00    Pack years: 5.00    Types: Cigarettes  . Smokeless tobacco: Never Used  Substance and Sexual Activity  . Alcohol use: No  . Drug use: No  . Sexual activity: Not Currently    Birth control/protection: Surgical  Other Topics Concern  . Not on file  Social History Narrative  . Not on file    Review  of Systems: See HPI, otherwise negative ROS  Physical Exam: BP (!) 149/88   Pulse 70   Temp 98.3 F (36.8 C) (Oral)   Ht 5\' 7"  (1.702 m)   Wt 190 lb 3.2 oz (86.3 kg)   LMP 02/09/2012   BMI 29.79 kg/m  General:   Alert,  Well-developed, well-nourished, pleasant and cooperative in NAD Skin:  Intact without significant lesions or rashes. Eyes:  Sclera clear, no icterus.   Conjunctiva pink. Ears:  Normal auditory acuity. Nose:  No deformity, discharge,  or lesions. Mouth:  No deformity or lesions. Neck:  Supple; no masses or thyromegaly. No significant cervical adenopathy. Lungs:  Clear throughout to auscultation.   No wheezes, crackles, or rhonchi. No acute distress. Heart:  Regular rate and rhythm; no murmurs, clicks, rubs,  or gallops. Abdomen: Non-distended, normal bowel sounds.  Soft and nontender without appreciable mass or hepatosplenomegaly.  Pulses:  Normal pulses noted. Extremities:  Without clubbing or edema.  Impression:  Pleasant 51 year old lady with a severe comp K to reflux esophagitis and documented gastroparesis. Noncompliant with the recommended medical regimen largely due to limited financial resources. She now has Medicaid, hopefully, will be able to start substantial treatment for GERD and gastroparesis.  She is also constipated as a separate issue.   Recommendations:Continue Dexilant 60 mg daily  Use Carafate / Ranitidine as needed for break-through symptoms  Try Reglan as prescribed previously -discussed side effects including tardive dyskinesia, etc.  Use Miralax 17 grams at bedtime as needed for constipation  Office visit in 3 months     Notice: This dictation was prepared with Dragon dictation along with smaller phrase technology. Any transcriptional errors that result from this process are unintentional and may not be corrected upon review.

## 2017-10-31 NOTE — Telephone Encounter (Signed)
I can sign

## 2017-10-31 NOTE — Telephone Encounter (Signed)
I will be more than happy to type up a letter if you will sign it.  Since the paperwork has your name on it, the patient assistance company will only take it from you.

## 2017-10-31 NOTE — Progress Notes (Signed)
Primary Care Physician:  Patient, No Pcp Per Primary Gastroenterologist:  Dr. Gala Romney  Pre-Procedure History & Physical: HPI:  Rita [name] is a 51 y.o. female here for   Past Medical History:  Diagnosis Date  . Arthritis   . Barrett's esophagus with esophagitis 03/26/2013  . Chronic abdominal pain   . Chronic back pain   . GERD (gastroesophageal reflux disease)   . Heart murmur   . Hiatal hernia   . Pancreatitis   . Peptic ulcer   . Tobacco use 03/26/2013    Past Surgical History:  Procedure Laterality Date  . CESAREAN SECTION    . CHOLECYSTECTOMY    . ESOPHAGOGASTRODUODENOSCOPY  Oct 2011   Dr. Laural Golden: ulcer in distal esophagus, soft stricture at GE junction s/p balloon dilation PATH: BARRETT'S  . TUBAL LIGATION      Prior to Admission medications   Medication Sig Start Date End Date Taking? Authorizing Provider  dexlansoprazole (DEXILANT) 60 MG capsule Take 60 mg by mouth daily.   Yes [provider]  esomeprazole (NEXIUM) 20 MG capsule Take 20 mg by mouth daily. Sometimes takes 2-3   Yes [provider]  ranitidine (ZANTAC) 150 MG tablet Take 1 tablet (150 mg total) by mouth at bedtime. Patient taking differently: Take 150 mg as needed by mouth.  06/19/17  Yes Carlis Stable, NP  sucralfate (CARAFATE) 1 g tablet Take 1 tablet (1 g total) by mouth 4 (four) times daily -  with meals and at bedtime. Patient taking differently: Take 1 g 4 (four) times daily -  with meals and at bedtime by mouth. Doesn't take everyday. Sometimes takes once a day 06/19/17  Yes Carlis Stable, NP  HYDROcodone-acetaminophen (NORCO/VICODIN) 5-325 MG tablet Take 1 tablet by mouth every 6 (six) hours as needed for moderate pain. Patient not taking: Reported on 10/31/2017 07/19/17   Milton Ferguson, MD  metoCLOPramide (REGLAN) 5 MG tablet Take 1 tablet (5 mg total) by mouth 4 (four) times daily -  before meals and at bedtime. Patient not taking: Reported on 10/31/2017 09/12/17   Rourk, Cristopher Estimable, MD  promethazine (PHENERGAN) 25 MG suppository Place 1 suppository (25 mg total) rectally every 6 (six) hours as needed for nausea or vomiting. Patient not taking: Reported on 10/31/2017 07/19/17   Milton Ferguson, MD    Allergies as of 10/31/2017  . (No Known Allergies)    Family History  Problem Relation Age of Onset  . Asthma Mother   . Heart failure Mother   . Cancer Mother   . Diabetes Mother   . Hypertension Mother   . Stroke Mother   . Pancreatic cancer Mother        deceased  . Heart failure Father   . Diabetes Father   . Colon cancer Neg Hx     Social History   Socioeconomic History  . Marital status: Single    Spouse name: Not on file  . Number of children: Not on file  . Years of education: Not on file  . Highest education level: Not on file  Social Needs  . Financial resource strain: Not on file  . Food insecurity - worry: Not on file  . Food insecurity - inability: Not on file  . Transportation needs - medical: Not on file  . Transportation needs - non-medical: Not on file  Occupational History  . Occupation: farm  Tobacco Use  . Smoking status: Current Every Day Smoker  Packs/day: 0.25    Years: 20.00    Pack years: 5.00    Types: Cigarettes  . Smokeless tobacco: Never Used  Substance and Sexual Activity  . Alcohol use: No  . Drug use: No  . Sexual activity: Not Currently    Birth control/protection: Surgical  Other Topics Concern  . Not on file  Social History Narrative  . Not on file    Review of Systems: See HPI, otherwise negative ROS  Physical Exam: BP (!) 149/88   Pulse 70   Temp 98.3 F (36.8 C) (Oral)   Ht 5\' 7"  (1.702 m)   Wt 190 lb 3.2 oz (86.3 kg)   LMP 02/09/2012   BMI 29.79 kg/m  General:   Alert,  Well-developed, well-nourished, pleasant and cooperative in NAD Neck:  Supple; no masses or thyromegaly. No significant cervical adenopathy. Lungs:  Clear throughout to auscultation.   No wheezes, crackles, or rhonchi. No  acute distress. Heart:  Regular rate and rhythm; no murmurs, clicks, rubs,  or gallops. Abdomen: Non-distended, normal bowel sounds.  Soft and nontender without appreciable mass or hepatosplenomegaly.  Pulses:  Normal pulses noted. Extremities:  Without clubbing or edema.  Impression:     Notice: This dictation was prepared with Dragon dictation along with smaller phrase technology. Any transcriptional errors that result from this process are unintentional and may not be corrected upon review.

## 2017-10-31 NOTE — Telephone Encounter (Signed)
Two other providers have seen her since me. I last saw her in 02/2017. May be more appropriate coming from them. However, it sounds like you just need a letter stating patient denies having a source of income. You could type a generic letter up and let one of [place] sign it.

## 2017-11-02 NOTE — Telephone Encounter (Signed)
Letter done and placed on [facility] desk.

## 2017-11-03 NOTE — Telephone Encounter (Signed)
Done

## 2017-12-29 ENCOUNTER — Encounter: Payer: Self-pay | Admitting: Internal Medicine

## 2018-01-17 ENCOUNTER — Other Ambulatory Visit: Payer: Self-pay

## 2018-01-17 ENCOUNTER — Ambulatory Visit: Payer: Medicare Other | Admitting: Family Medicine

## 2018-01-17 ENCOUNTER — Encounter: Payer: Self-pay | Admitting: Family Medicine

## 2018-01-17 VITALS — BP 110/70 | HR 76 | Temp 98.4°F | Resp 16 | Ht 67.0 in | Wt 186.2 lb

## 2018-01-17 DIAGNOSIS — G458 Other transient cerebral ischemic attacks and related syndromes: Secondary | ICD-10-CM | POA: Diagnosis not present

## 2018-01-17 DIAGNOSIS — Z23 Encounter for immunization: Secondary | ICD-10-CM | POA: Diagnosis not present

## 2018-01-17 DIAGNOSIS — R739 Hyperglycemia, unspecified: Secondary | ICD-10-CM

## 2018-01-17 DIAGNOSIS — I10 Essential (primary) hypertension: Secondary | ICD-10-CM | POA: Diagnosis not present

## 2018-01-17 DIAGNOSIS — R5383 Other fatigue: Secondary | ICD-10-CM | POA: Diagnosis not present

## 2018-01-17 DIAGNOSIS — K861 Other chronic pancreatitis: Secondary | ICD-10-CM | POA: Diagnosis not present

## 2018-01-17 DIAGNOSIS — Z720 Tobacco use: Secondary | ICD-10-CM

## 2018-01-17 DIAGNOSIS — R011 Cardiac murmur, unspecified: Secondary | ICD-10-CM | POA: Diagnosis not present

## 2018-01-17 DIAGNOSIS — R0609 Other forms of dyspnea: Secondary | ICD-10-CM

## 2018-01-17 DIAGNOSIS — F321 Major depressive disorder, single episode, moderate: Secondary | ICD-10-CM | POA: Diagnosis not present

## 2018-01-17 DIAGNOSIS — Z113 Encounter for screening for infections with a predominantly sexual mode of transmission: Secondary | ICD-10-CM

## 2018-01-17 MED ORDER — ESCITALOPRAM OXALATE 10 MG PO TABS: 10.0000 mg | ORAL_TABLET | Freq: Every day | ORAL | 0 refills | Status: DC

## 2018-01-17 MED ORDER — ASPIRIN EC 81 MG PO TBEC: 81.0000 mg | DELAYED_RELEASE_TABLET | Freq: Every day | ORAL | Status: DC

## 2018-01-17 MED ORDER — AMLODIPINE BESYLATE 5 MG PO TABS: 5.0000 mg | ORAL_TABLET | Freq: Every day | ORAL | 0 refills | Status: DC

## 2018-01-17 NOTE — Progress Notes (Addendum)
Patient ID: Rita [name], female    DOB: November 17, 1966, 52 y.o.   MRN: [id number]  Chief Complaint  Patient presents with  . Abdominal Pain  . Pancreatitis  . Dizziness  . Spots and/or Floaters  . Hand Pain    left  . Hypertension  . Arthritis    Allergies Patient has no known allergies.  Subjective:   Rita [name] is a 52 y.o. female who presents to [facility] today.  HPI Rita [name] presents as a new patient visit to establish care.  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  She is estranged from her husband but has relationship with her 3 children.  Due to lack of insurance and resources she has not had a primary care physician in many years.  She has been followed by Dr. Sydell Axon in gastroenterology for multiple stomach issues/pancreatic issues.  She has had multiple endoscopies and hospitalizations due to pancreatitis.  She reports that her pancreatitis is not related to alcohol use.  She denies alcohol or drug use.  However she does have a significant tobacco use history.  She still currently smokes at least 1 pack/day.  Comes in today for several concerns.  She reports that on Monday, 2 days ago, had an episode that lasted approximately 20 seconds where she had decreased visual acuity and loss of part of her visual fields.  She reports that she could not see well in the center of her visual field, however she could still see in her peripheral visual field.  This episode had no associated syncope, loss of speech, numbness or tingling in her extremities, or other associated neurological manifestations.  She d reports that she felt a bit dizzy prior to the episode.  She also reports an increased amount of floaters in her visual field over the past several months.  She reports that she was very concerned about her vision has an appointment with Dr. Gershon Crane on 02/09/2018.   In addition, she gives a history that for approximately 4 months, that her left arm will hurt  and get very painful, and often feel cold.  Reports that most of these episodes occur reports that she in the late evening and night. Has to sleep with arm propped up or hand hurts terribly. Cannot keep left arm elevated for a long time or left biceps get tired and has in associated tingling sensation.  Reports that if holds up left arm for a period of time, her left hand will go weak and hard to move fingers.   She reports that she has had a history of elevated blood pressure in the past.  She reports that she is gone to the doctor on several occasions and would have an elevated blood pressure in her right arm and a much lower blood pressure in her left arm.  She reports that she has never been treated for hypertension in the past.  She does not take an aspirin.  Has had no history of GI bleeding in the past.  No prior heart attack or known stroke.  Reports that her other concern today is about her mood.  Reports that for the past year has felt depressed and down.  Reports her energy level is low.  Feels sad.  Cries on occasions.  Feels down, depressed, and at times hopeless.  Denies any suicidal ideation.  Reports that approximately 4 months ago she might have hurt herself, however reports she does have a reason  to live now that her twin granddaughters live with her.  She reports that she enjoys being around them and this helps with her mood.  She reports that low appetite, but this is been the case for quite some time.  She reports that she suffers from nausea due to her reflux and pancreatitis symptoms.  Reports she does not sleep well at night.  Does not always want to do things for fun like she has in the past.  Concentration is fair.  Reports she does feel anxious at times.  She has never been hospitalized for her mood.  She has never been on any psychiatric medications.  She reports she has a family history of depression and anxiety.  Reports that over the past 4 months is also felt more winded with  exertion.  Denies any discrete chest pain.  Denies palpitations.  Has never used an inhaler or been hospitalized for her breathing.  Upon questioning, patient reports she has been told that she has a heart murmur but is never had a workup of this in the past.  Denies any swelling in her extremities.  Denies cough or sputum production.  Gets winded if has to walk faster or up inclines or stairs.     Depression         This is a chronic problem.  The current episode started more than 1 year ago.   The onset quality is gradual.   The problem occurs daily.  The problem has been gradually worsening since onset.  Associated symptoms include fatigue, insomnia, irritable, restlessness, decreased interest, body aches and sad.  Associated symptoms include no appetite change and no suicidal ideas.     Exacerbated by: health issues.  Past treatments include nothing.  Past medical history includes chronic illness and recent illness.     Pertinent negatives include no suicide attempts.   Past Medical History:  Diagnosis Date  . Arthritis   . Barrett's esophagus with esophagitis 03/26/2013  . Chronic abdominal pain   . Chronic back pain   . GERD (gastroesophageal reflux disease)   . Heart murmur   . Hiatal hernia   . Pancreatitis   . Peptic ulcer   . Tobacco use 03/26/2013    Past Surgical History:  Procedure Laterality Date  . BIOPSY  04/17/2017   Procedure: BIOPSY;  Surgeon: Daneil Dolin, MD;  Location: [facility];  Service: Endoscopy;;  esophageal  . CESAREAN SECTION    . CHOLECYSTECTOMY    . COLONOSCOPY WITH PROPOFOL N/A 04/17/2017   Procedure: COLONOSCOPY WITH PROPOFOL;  Surgeon: Daneil Dolin, MD;  Location: [facility];  Service: Endoscopy;  Laterality: N/A;  100  . ESOPHAGOGASTRODUODENOSCOPY  Oct 2011   Dr. Laural Golden: ulcer in distal esophagus, soft stricture at GE junction s/p balloon dilation PATH: BARRETT'S  . ESOPHAGOGASTRODUODENOSCOPY (EGD) WITH ESOPHAGEAL DILATION N/A 03/27/2013    Procedure: ESOPHAGOGASTRODUODENOSCOPY (EGD) WITH ESOPHAGEAL DILATION;  Surgeon: Daneil Dolin, MD;  [facility]: [facility];  Service: Endoscopy;  Laterality: N/A;  possible dilation  . ESOPHAGOGASTRODUODENOSCOPY (EGD) WITH PROPOFOL N/A 01/02/2017   Procedure: ESOPHAGOGASTRODUODENOSCOPY (EGD) WITH PROPOFOL;  Surgeon: Daneil Dolin, MD;  [facility]: [facility];  Service: Endoscopy;  Laterality: N/A;  with possible esophageal dilation  . ESOPHAGOGASTRODUODENOSCOPY (EGD) WITH PROPOFOL N/A 04/17/2017   Procedure: ESOPHAGOGASTRODUODENOSCOPY (EGD) WITH PROPOFOL;  Surgeon: Daneil Dolin, MD;  [facility]: [facility];  Service: Endoscopy;  Laterality: N/A;  . EUS N/A 05/09/2013   Procedure: UPPER  ENDOSCOPIC ULTRASOUND (EUS) LINEAR;  Surgeon: Milus Banister, MD;  Location: [facility];  Service: Endoscopy;  Laterality: N/A;  Venia Minks DILATION N/A 04/17/2017   Procedure: Venia Minks DILATION;  Surgeon: Daneil Dolin, MD;  [facility];  Service: Endoscopy;  Laterality: N/A;  . POLYPECTOMY  04/17/2017   Procedure: POLYPECTOMY;  Surgeon: Daneil Dolin, MD;  [facility]: [facility];  Service: Endoscopy;;  . TUBAL LIGATION      Family History  Problem Relation Age of Onset  . Asthma Mother   . Heart failure Mother   . Cancer Mother        pancreatic  . Diabetes Mother   . Hypertension Mother   . Stroke Mother   . Pancreatic cancer Mother        deceased  . Heart failure Father   . Diabetes Father   . Colon cancer Neg Hx      Social History   Socioeconomic History  . Marital status: Single    Spouse name: None  . Number of children: None  . Years of education: None  . Highest education level: None  Social Needs  . Financial resource strain: None  . Food insecurity - worry: None  . Food insecurity - inability: None  . Transportation needs - medical: None  . Transportation needs - non-medical: None  Occupational History  . Occupation: farm  Tobacco Use  . Smoking status:  Current Every Day Smoker    Packs/day: 1.50    Years: 20.00    Pack years: 30.00    Types: Cigarettes  . Smokeless tobacco: Never Used  Substance and Sexual Activity  . Alcohol use: No  . Drug use: No  . Sexual activity: Yes    Partners: Male    Birth control/protection: Surgical  Other Topics Concern  . None  Social History Narrative   Lives in [place], [place].   Is on disability, was a Psychologist, sport and exercise and drove tractors.    Wears seatbelt.   Cannot eat meat due to esophagus.   Smokes cigarettes.   Drinks sodas, 1/2 liter a day.    3 boys and 1 girl. 2 boys live in [place]. 1 son died in 4th month of pregnancy.   Live with friend and his wife at this time.   Used to be married and was abandoned, he would not give her a divorce.       Review of Systems  Constitutional: Positive for fatigue. Negative for activity change, appetite change, fever and unexpected weight change.       More fatigue over past three months.   HENT: Negative for voice change.   Eyes: Negative for photophobia, pain and visual disturbance.       Patient is currently within normal limits per patient.  Does not wear glasses or corrective lenses.  Respiratory: Negative for cough, chest tightness and shortness of breath.        Gets winded when walks distance or walks fast, has been going on for the past 3-4 months.   Cardiovascular: Negative for chest pain, palpitations and leg swelling.       Occasionally feels like has a fluttering in her heart and it takes her breath away. Was told in the past that has a heart murmur but has never had work up of the pain. No edema in LE.   Gastrointestinal: Positive for abdominal pain. Negative for nausea and vomiting.       History of chronic pancreatitis.  No current abdominal pain at this time.  Reports she does suffer from chronic nausea.  Uses Zofran and Phenergan from time to time.  Endocrine: Negative for polyphagia and polyuria.  Genitourinary: Negative for dysuria,  frequency, hematuria and urgency.  Musculoskeletal:       Denies any injury or trauma to her left arm or clavicular area.  Skin: Negative for rash.  Neurological: Positive for weakness and numbness. Negative for dizziness, seizures, syncope and light-headedness.       Numbness and weakness symptoms occur in her hand, left-sided with certain positions.  Denies any other associated numbness or weakness symptoms in her body throughout.  Hematological: Negative for adenopathy.  Psychiatric/Behavioral: Positive for agitation, depression, dysphoric mood and sleep disturbance. Negative for suicidal ideas. The patient is nervous/anxious and has insomnia.        Denies suicidal ideations. Wants to feel better and live b/c of family and twin grandchildren.  Reports has trouble with sleep at times.     Objective:   BP 110/70 (BP Location: Left Arm, Patient Position: Sitting, Cuff Size: Normal)   Pulse 76   Temp 98.4 F (36.9 C) (Other (Comment))   Resp 16   Ht 5\' 7"  (1.702 m)   Wt 186 lb 4 oz (84.5 kg)   LMP 02/09/2012   SpO2 98%   BMI 29.17 kg/m   Physical Exam  Constitutional: She is oriented to person, place, and time. She appears well-developed and well-nourished. She is irritable. No distress.  Pleasant, well-developed well-nourished female, appearance older than stated age.  Smells of tobacco smoke.  HENT:  Head: Normocephalic and atraumatic.  Eyes: Pupils are equal, round, and reactive to light.  Neck: Normal range of motion. Neck supple. No thyromegaly present.  Cardiovascular: Normal rate and regular rhythm.  Murmur heard.  Systolic murmur is present with a grade of 3/6. Pulses:      Radial pulses are 2+ on the right side, and 0 on the left side.  Left-sided radial, brachial, and axillary pulse unable to be palpated.   left sided carotid pulse weaker than right.  Pulmonary/Chest: Effort normal and breath sounds normal. No respiratory distress.  Musculoskeletal:  Muscle tone and  strength intact in upper and lower extremities bilaterally.  No muscle atrophy present.  Neurological: She is alert and oriented to person, place, and time. She displays normal reflexes. No cranial nerve deficit or sensory deficit. Coordination normal.  Skin: Skin is warm and dry. No rash noted.  Psychiatric: Her behavior is normal. Judgment and thought content normal. Her mood appears anxious. Her affect is not blunt, not labile and not inappropriate. Her speech is not slurred. Cognition and memory are normal. She exhibits a depressed mood. She expresses no homicidal and no suicidal ideation. She expresses no suicidal plans and no homicidal plans.  Patient with depressed mood.  Affect consistent with mood.  However her affect does have a full range.  She is able to smile during examination and discussion.  She answers questions appropriately.  Thought content goal directed without evidence of delusions, phobias, obsessions, or compulsions.  No suicidal or homicidal ideations.  Nursing note and vitals reviewed.  Depression screen [facility] 2/9 01/17/2018  Decreased Interest 3  Down, Depressed, Hopeless 3  PHQ - 2 Score 6  Altered sleeping 3  Tired, decreased energy 3  Change in appetite 3  Feeling bad or failure about yourself  3  Trouble concentrating 3  Moving slowly or fidgety/restless 1  Suicidal thoughts 0  PHQ-9 Score 22     Assessment and Plan   1. Subclavian steal syndrome  Called and discussed patient case with Dr. Harrell Gave Dickson/Vascular Surgeon.  We will see patient in their office tomorrow for evaluation.  Will see and evaluate patient and order subsequent arteriogram.  Carotid duplex will be performed in the office tomorrow.  Will  start aspirin at this time to decrease cardiovascular/stroke risk.  Counseled patient regarding -The possible etiologies of symptoms including subclavian artery stenosis versus atherosclerosis versus arterial pathology.  We discussed the need for her  to follow-up with vascular skin surgeon tomorrow morning.  She voiced understanding. - aspirin EC 81 MG tablet; Take 1 tablet (81 mg total) by mouth daily. - Ambulatory referral to Vascular Surgery  2. Depression, major, single episode, moderate (HCC) Moderate to severe depression.  Uncontrolled.  No prior history of SSRI or psychiatric medication use.  At this time will start Lexapro 10 mg p.o. daily.  Unable to try Celexa secondary to concurrent use of Nexium.  Patient was counseled today regarding the diagnosis of depression, mechanism of action of medications, need for compliance and daily administration, and possible side effects of medication.   Suicide risks evaluated and documented in note if present or in the area below.  Patient does not have/denies the following risks: previous suicide attempts, family history of suicide, prior history of psychiatric disorder, history of alcohol or substance abuse disorder, recent loss of a loved one, or severe hopelessness.  Patient has protective factors of family and community support.  Patient reports that family believes is behaving rationally. Patient displays problem solving skills.   Patient specifically denies suicide ideation. Patient has access/information to healthcare contacts if situation or mood changes where patient is a risk to self or others or mood becomes unstable.   During the encounter, the patient had good eye contact and firm handshake regarding safety contract and agreement to seek help if mood worsens and not to harm self.   Patient understands the treatment plan and is in agreement. Agrees to keep follow up and call prior or return to clinic if needed.  Patient will follow-up in 2 weeks or sooner if needed.  She has numbers for behavioral health support.  We will discuss possible referral to psychiatry at follow-up.  She will contact medical help if mood worsens or develop suicidal ideation.  - escitalopram (LEXAPRO) 10 MG  tablet; Take 1 tablet (10 mg total) by mouth daily.  Dispense: 30 tablet; Refill: 0  3. Tobacco abuse The 5 A's Model for treating Tobacco Use and Dependence was used today. I have identified and documented tobacco use status for this patient. I have urged the patient to quit tobacco use. At this time, the patient is unwilling and not ready to attempt to quit. I have provided patient with information regarding risks, cessation techniques, and interventions that might increase future attempts to quit smoking. I will plan on again addressing tobacco dependence at the next visit.   4. Hypertension, unspecified type We will start Norvasc at this time.  Discussed with Dr. Scot Dock starting medication.  He did not feel that blood pressure lowering therapy would make patient more symptomatic with her left arm symptoms.  She will follow-up in 10 days to 2 weeks for recheck of blood pressure.  Will obtain baseline metabolic studies today. - Basic metabolic panel - Lipid panel - amLODipine (NORVASC) 5 MG tablet; Take 1 tablet (5 mg total) by mouth daily.  Dispense: 30 tablet;  Refill: 0  5. Fatigue, unspecified type Suspect secondary to mood disorder, but check studies today. - CBC with Differential/Platelet - TSH - Hepatic function panel  7. Heart murmur Never evaluated, new diagnosis.  Refer to cardiology.  Patient will need echo but also believe patient probably needs stress test so will have her evaluated by cardiology prior to ordering testing. - Ambulatory referral to Cardiology  8. Hyperglycemia Review of labs revealed elevated blood pressure in the past and known pancreatic dysfunction.  Screen for diabetes today. - Hemoglobin A1c  9. Screen for STD (sexually transmitted disease) Testing recommended and discussed with patient today.  She is currently having no symptoms of vaginal STD.  However due to history will screen. - Hepatitis panel, acute - RPR - HIV antibody  10. Dyspnea on  exertion Possibly secondary to pulmonary versus cardiac etiology.  Tobacco cessation recommended.  Obtain evaluation from cardiology first. - Ambulatory referral to Cardiology  11. Immunization due  - Flu Vaccine QUAD 6+ mos PF IM (Fluarix Quad PF)  Office visit today was 60 minutes.  Greater than 50% of office visit was spent counseling and coordinating care.  Will review remaining GI records in chart. Return in about 10 days (around 01/27/2018) for follow up. Caren Macadam, MD 01/18/2018

## 2018-01-18 ENCOUNTER — Other Ambulatory Visit: Payer: Self-pay

## 2018-01-18 ENCOUNTER — Ambulatory Visit (INDEPENDENT_AMBULATORY_CARE_PROVIDER_SITE_OTHER): Payer: Medicare Other | Admitting: Vascular Surgery

## 2018-01-18 ENCOUNTER — Ambulatory Visit (HOSPITAL_COMMUNITY)
Admission: RE | Admit: 2018-01-18 | Discharge: 2018-01-18 | Disposition: A | Discharge status: Home / Self Care | Payer: Medicare Other | Source: Ambulatory Visit | Attending: Vascular Surgery | Admitting: Vascular Surgery

## 2018-01-18 ENCOUNTER — Encounter: Payer: Self-pay | Admitting: Vascular Surgery

## 2018-01-18 ENCOUNTER — Other Ambulatory Visit: Payer: Self-pay | Admitting: *Deleted

## 2018-01-18 VITALS — BP 131/82 | HR 58 | Temp 97.7°F | Resp 20 | Ht 66.0 in | Wt 183.0 lb

## 2018-01-18 DIAGNOSIS — I6529 Occlusion and stenosis of unspecified carotid artery: Secondary | ICD-10-CM | POA: Diagnosis not present

## 2018-01-18 DIAGNOSIS — I739 Peripheral vascular disease, unspecified: Secondary | ICD-10-CM | POA: Diagnosis not present

## 2018-01-18 DIAGNOSIS — I6523 Occlusion and stenosis of bilateral carotid arteries: Secondary | ICD-10-CM | POA: Diagnosis not present

## 2018-01-18 LAB — VAS US CAROTID
LCCAPSYS: 120 cm/s
LEFT ECA DIAS: -22 cm/s
LICADSYS: -92 cm/s
LICAPDIAS: -27 cm/s
LICAPSYS: -94 cm/s
Left CCA dist dias: -30 cm/s
Left CCA dist sys: -107 cm/s
Left CCA prox dias: 24 cm/s
Left ICA dist dias: -29 cm/s
RIGHT CCA MID DIAS: 16 cm/s
RIGHT ECA DIAS: -24 cm/s
Right CCA prox dias: 30 cm/s
Right CCA prox sys: 129 cm/s
Right cca dist sys: -109 cm/s

## 2018-01-18 MED ORDER — SIMVASTATIN 10 MG PO TABS
10.0000 mg | ORAL_TABLET | Freq: Every day | ORAL | 6 refills | Status: DC
Start: 2018-01-18 — End: 2018-02-08

## 2018-01-18 NOTE — H&P (View-Only) (Signed)
Referring Physician: Dr Mannie Stabile  Patient name: Rita [name] MRN: [id number] DOB: 1966/10/11 Sex: female  REASON FOR CONSULT: Left arm pain  HPI: Rita [name] is a 52 y.o. female with approximately 1 year history of left arm fatigue.  He has occasional numbness tingling and coolness in her left hand.  She denies any dizziness.  She also complains of pain over the dorsum of her left wrist on the ulnar side of the hand.  She was started on aspirin yesterday.  She currently smokes but has been counseled in smoking cessation by her primary care physician.  She denies prior history of myocardial infarction chest pain shortness of breath stroke or diabetes.  Other medical problems include chronic abdominal pain.  Followed by Dr. Gala Romney with GI.  Past Medical History:  Diagnosis Date  . Arthritis   . Barrett's esophagus with esophagitis 03/26/2013  . Chronic abdominal pain   . Chronic back pain   . GERD (gastroesophageal reflux disease)   . Heart murmur   . Hiatal hernia   . Pancreatitis   . Peptic ulcer   . Tobacco use 03/26/2013   Past Surgical History:  Procedure Laterality Date  . BIOPSY  04/17/2017   Procedure: BIOPSY;  Surgeon: Daneil Dolin, MD;  [facility];  Service: Endoscopy;;  esophageal  . CESAREAN SECTION    . CHOLECYSTECTOMY    . COLONOSCOPY WITH PROPOFOL N/A 04/17/2017   Procedure: COLONOSCOPY WITH PROPOFOL;  Surgeon: Daneil Dolin, MD;  [facility];  Service: Endoscopy;  Laterality: N/A;  100  . ESOPHAGOGASTRODUODENOSCOPY  Oct 2011   Dr. Laural Golden: ulcer in distal esophagus, soft stricture at GE junction s/p balloon dilation PATH: BARRETT'S  . ESOPHAGOGASTRODUODENOSCOPY (EGD) WITH ESOPHAGEAL DILATION N/A 03/27/2013   Procedure: ESOPHAGOGASTRODUODENOSCOPY (EGD) WITH ESOPHAGEAL DILATION;  Surgeon: Daneil Dolin, MD;  [facility];  Service: Endoscopy;  Laterality: N/A;  possible dilation  . ESOPHAGOGASTRODUODENOSCOPY (EGD) WITH PROPOFOL N/A  01/02/2017   Procedure: ESOPHAGOGASTRODUODENOSCOPY (EGD) WITH PROPOFOL;  Surgeon: Daneil Dolin, MD;  [facility];  Service: Endoscopy;  Laterality: N/A;  with possible esophageal dilation  . ESOPHAGOGASTRODUODENOSCOPY (EGD) WITH PROPOFOL N/A 04/17/2017   Procedure: ESOPHAGOGASTRODUODENOSCOPY (EGD) WITH PROPOFOL;  Surgeon: Daneil Dolin, MD;  [facility];  Service: Endoscopy;  Laterality: N/A;  . EUS N/A 05/09/2013   Procedure: UPPER ENDOSCOPIC ULTRASOUND (EUS) LINEAR;  Surgeon: Milus Banister, MD;  [facility];  Service: Endoscopy;  Laterality: N/A;  Venia Minks DILATION N/A 04/17/2017   Procedure: Venia Minks DILATION;  Surgeon: Daneil Dolin, MD;  [facility];  Service: Endoscopy;  Laterality: N/A;  . POLYPECTOMY  04/17/2017   Procedure: POLYPECTOMY;  Surgeon: Daneil Dolin, MD;  [facility];  Service: Endoscopy;;  . TUBAL LIGATION      Family History  Problem Relation Age of Onset  . Asthma Mother   . Heart failure Mother   . Cancer Mother        pancreatic  . Diabetes Mother   . Hypertension Mother   . Stroke Mother   . Pancreatic cancer Mother        deceased  . Heart failure Father   . Diabetes Father   . Colon cancer Neg Hx     SOCIAL HISTORY: Social History   Socioeconomic History  . Marital status: Single    Spouse name: Not on file  . Number of children: Not  on file  . Years of education: Not on file  . Highest education level: Not on file  Social Needs  . Financial resource strain: Not on file  . Food insecurity - worry: Not on file  . Food insecurity - inability: Not on file  . Transportation needs - medical: Not on file  . Transportation needs - non-medical: Not on file  Occupational History  . Occupation: farm  Tobacco Use  . Smoking status: Current Every Day Smoker    Packs/day: 1.50    Years: 20.00    Pack years: 30.00    Types: Cigarettes  . Smokeless tobacco: Never Used  Substance and Sexual  Activity  . Alcohol use: No  . Drug use: No  . Sexual activity: Yes    Partners: Male    Birth control/protection: Surgical  Other Topics Concern  . Not on file  Social History Narrative   Lives in [place], [place].   Is on disability, was a Psychologist, sport and exercise and drove tractors.    Wears seatbelt.   Cannot eat meat due to esophagus.   Smokes cigarettes.   Drinks sodas, 1/2 liter a day.    3 boys and 1 girl. 2 boys live in [place]. 1 son died in 80th month of pregnancy.   Live with friend and his wife at this time.   Used to be married and was abandoned, he would not give her a divorce.       No Known Allergies  Current Outpatient Medications  Medication Sig Dispense Refill  . dexlansoprazole (DEXILANT) 60 MG capsule Take 60 mg by mouth daily.    . ranitidine (ZANTAC) 150 MG tablet Take 1 tablet (150 mg total) by mouth at bedtime. (Patient taking differently: Take 150 mg as needed by mouth. ) 30 tablet 3  . sucralfate (CARAFATE) 1 g tablet Take 1 tablet (1 g total) by mouth 4 (four) times daily -  with meals and at bedtime. (Patient taking differently: Take 1 g 4 (four) times daily -  with meals and at bedtime by mouth. Doesn't take everyday. Sometimes takes once a day) 30 tablet 1  . amLODipine (NORVASC) 5 MG tablet Take 1 tablet (5 mg total) by mouth daily. (Patient not taking: Reported on 01/18/2018) 30 tablet 0  . aspirin EC 81 MG tablet Take 1 tablet (81 mg total) by mouth daily. (Patient not taking: Reported on 01/18/2018)    . escitalopram (LEXAPRO) 10 MG tablet Take 1 tablet (10 mg total) by mouth daily. (Patient not taking: Reported on 01/18/2018) 30 tablet 0  . esomeprazole (NEXIUM) 20 MG capsule Take 20 mg by mouth daily. Sometimes takes 2-3    . HYDROcodone-acetaminophen (NORCO/VICODIN) 5-325 MG tablet Take 1 tablet by mouth every 6 (six) hours as needed for moderate pain. (Patient not taking: Reported on 10/31/2017) 20 tablet 0  . metoCLOPramide (REGLAN) 5 MG tablet Take 1 tablet (5 mg  total) by mouth 4 (four) times daily -  before meals and at bedtime. (Patient not taking: Reported on 10/31/2017) 60 tablet 0  . promethazine (PHENERGAN) 25 MG suppository Place 1 suppository (25 mg total) rectally every 6 (six) hours as needed for nausea or vomiting. (Patient not taking: Reported on 10/31/2017) 12 suppository 1   No current facility-administered medications for this visit.     ROS:   General:  No weight loss, Fever, chills  HEENT: No recent headaches, no nasal bleeding, no visual changes, no sore throat  Neurologic: No dizziness, blackouts, seizures. No  recent symptoms of stroke or mini- stroke. No recent episodes of slurred speech, or temporary blindness.  Cardiac: No recent episodes of chest pain/pressure, no shortness of breath at rest.  No shortness of breath with exertion.  Denies history of atrial fibrillation or irregular heartbeat  Vascular: No history of rest pain in feet.  No history of claudication.  No history of non-healing ulcer, No history of DVT   Pulmonary: No home oxygen, no productive cough, no hemoptysis,  No asthma or wheezing  Musculoskeletal:  [X]  Arthritis, [ ]  Low back pain,  [X]  Joint pain  Hematologic:No history of hypercoagulable state.  No history of easy bleeding.  No history of anemia  Gastrointestinal: No hematochezia or melena,  No gastroesophageal reflux, no trouble swallowing  Urinary: [ ]  chronic Kidney disease, [ ]  on HD - [ ]  MWF or [ ]  TTHS, [ ]  Burning with urination, [ ]  Frequent urination, [ ]  Difficulty urinating;   Skin: No rashes  Psychological: No history of anxiety,  No history of depression   Physical Examination  Vitals:   01/18/18 1112 01/18/18 1119  BP: (!) 183/106 131/82  Pulse: (!) 58 (!) 58  Resp: 20   Temp: 97.7 F (36.5 C)   TempSrc: Oral   SpO2: 97%   Weight: 183 lb (83 kg)   Height: 5\' 6"  (1.676 m)     Body mass index is 29.54 kg/m.  General:  Alert and oriented, no acute distress HEENT:  Normal Neck: No bruit or JVD Pulmonary: Clear to auscultation bilaterally Cardiac: Regular Rate and Rhythm without murmur Abdomen: Soft, non-tender, non-distended, no mass Skin: No rash Extremity Pulses:  2+ radial, brachial right side absent left brachial radial 2+ femoral, dorsalis pedis, posterior tibial pulses bilaterally Musculoskeletal: No deformity or edema  Neurologic: Upper and lower extremity motor 5/5 and symmetric  DATA:  She had a carotid duplex exam today.  Arm pressure could not be measured on the left side.  Right arm pressure was 139.  Evidence of subclavian stenosis on duplex as well as reversal of flow in the left vertebral artery.  Carotids were less than 40% stenosis bilaterally.  ASSESSMENT: Symptomatic left subclavian artery stenosis with exertional fatigue in the left arm.  I discussed with the patient today that I do not believe the pain that she has in the dorsum of her left wrist is related to her subclavian stenosis but certainly the early fatigue symptoms could be.  She does not have evidence of clinical steal and has no dizziness.  She was previously started on aspirin.  I started her on simvastatin today.   PLAN: Arch aortogram with left upper extremity arteriogram possible intervention on her left subclavian stenosis.  I discussed with her today the risk benefits possible complications and procedure details including but not limited to bleeding infection vessel injury contrast reaction and the possibility that she potentially would require operation rather than a percutaneous intervention if she has extensive left subclavian disease.  She understands and agrees to proceed.  This is scheduled for tomorrow January 19, 2018.   [name], MD [facility] Office: [phone] Pager: [phone]

## 2018-01-18 NOTE — Progress Notes (Signed)
Referring Physician: Dr Mannie Stabile  Patient name: Rita [name] MRN: [id number] DOB: 1966-01-31 Sex: female  REASON FOR CONSULT: Left arm pain  HPI: Rita [name] is a 52 y.o. female with approximately 1 year history of left arm fatigue.  He has occasional numbness tingling and coolness in her left hand.  She denies any dizziness.  She also complains of pain over the dorsum of her left wrist on the ulnar side of the hand.  She was started on aspirin yesterday.  She currently smokes but has been counseled in smoking cessation by her primary care physician.  She denies prior history of myocardial infarction chest pain shortness of breath stroke or diabetes.  Other medical problems include chronic abdominal pain.  Followed by Dr. Gala Romney with GI.  Past Medical History:  Diagnosis Date  . Arthritis   . Barrett's esophagus with esophagitis 03/26/2013  . Chronic abdominal pain   . Chronic back pain   . GERD (gastroesophageal reflux disease)   . Heart murmur   . Hiatal hernia   . Pancreatitis   . Peptic ulcer   . Tobacco use 03/26/2013   Past Surgical History:  Procedure Laterality Date  . BIOPSY  04/17/2017   Procedure: BIOPSY;  Surgeon: Daneil Dolin, MD;  [facility];  Service: Endoscopy;;  esophageal  . CESAREAN SECTION    . CHOLECYSTECTOMY    . COLONOSCOPY WITH PROPOFOL N/A 04/17/2017   Procedure: COLONOSCOPY WITH PROPOFOL;  Surgeon: Daneil Dolin, MD;  [facility];  Service: Endoscopy;  Laterality: N/A;  100  . ESOPHAGOGASTRODUODENOSCOPY  Oct 2011   Dr. Laural Golden: ulcer in distal esophagus, soft stricture at GE junction s/p balloon dilation PATH: BARRETT'S  . ESOPHAGOGASTRODUODENOSCOPY (EGD) WITH ESOPHAGEAL DILATION N/A 03/27/2013   Procedure: ESOPHAGOGASTRODUODENOSCOPY (EGD) WITH ESOPHAGEAL DILATION;  Surgeon: Daneil Dolin, MD;  [facility];  Service: Endoscopy;  Laterality: N/A;  possible dilation  . ESOPHAGOGASTRODUODENOSCOPY (EGD) WITH PROPOFOL N/A  01/02/2017   Procedure: ESOPHAGOGASTRODUODENOSCOPY (EGD) WITH PROPOFOL;  Surgeon: Daneil Dolin, MD;  [facility];  Service: Endoscopy;  Laterality: N/A;  with possible esophageal dilation  . ESOPHAGOGASTRODUODENOSCOPY (EGD) WITH PROPOFOL N/A 04/17/2017   Procedure: ESOPHAGOGASTRODUODENOSCOPY (EGD) WITH PROPOFOL;  Surgeon: Daneil Dolin, MD;  [facility];  Service: Endoscopy;  Laterality: N/A;  . EUS N/A 05/09/2013   Procedure: UPPER ENDOSCOPIC ULTRASOUND (EUS) LINEAR;  Surgeon: Milus Banister, MD;  [facility];  Service: Endoscopy;  Laterality: N/A;  Venia Minks DILATION N/A 04/17/2017   Procedure: Venia Minks DILATION;  Surgeon: Daneil Dolin, MD;  [facility];  Service: Endoscopy;  Laterality: N/A;  . POLYPECTOMY  04/17/2017   Procedure: POLYPECTOMY;  Surgeon: Daneil Dolin, MD;  [facility];  Service: Endoscopy;;  . TUBAL LIGATION      Family History  Problem Relation Age of Onset  . Asthma Mother   . Heart failure Mother   . Cancer Mother        pancreatic  . Diabetes Mother   . Hypertension Mother   . Stroke Mother   . Pancreatic cancer Mother        deceased  . Heart failure Father   . Diabetes Father   . Colon cancer Neg Hx     SOCIAL HISTORY: Social History   Socioeconomic History  . Marital status: Single    Spouse name: Not on file  . Number of children: Not  on file  . Years of education: Not on file  . Highest education level: Not on file  Social Needs  . Financial resource strain: Not on file  . Food insecurity - worry: Not on file  . Food insecurity - inability: Not on file  . Transportation needs - medical: Not on file  . Transportation needs - non-medical: Not on file  Occupational History  . Occupation: farm  Tobacco Use  . Smoking status: Current Every Day Smoker    Packs/day: 1.50    Years: 20.00    Pack years: 30.00    Types: Cigarettes  . Smokeless tobacco: Never Used  Substance and Sexual  Activity  . Alcohol use: No  . Drug use: No  . Sexual activity: Yes    Partners: Male    Birth control/protection: Surgical  Other Topics Concern  . Not on file  Social History Narrative   Lives in [place], [place].   Is on disability, was a Psychologist, sport and exercise and drove tractors.    Wears seatbelt.   Cannot eat meat due to esophagus.   Smokes cigarettes.   Drinks sodas, 1/2 liter a day.    3 boys and 1 girl. 2 boys live in [place]. 1 son died in 65th month of pregnancy.   Live with friend and his wife at this time.   Used to be married and was abandoned, he would not give her a divorce.       No Known Allergies  Current Outpatient Medications  Medication Sig Dispense Refill  . dexlansoprazole (DEXILANT) 60 MG capsule Take 60 mg by mouth daily.    . ranitidine (ZANTAC) 150 MG tablet Take 1 tablet (150 mg total) by mouth at bedtime. (Patient taking differently: Take 150 mg as needed by mouth. ) 30 tablet 3  . sucralfate (CARAFATE) 1 g tablet Take 1 tablet (1 g total) by mouth 4 (four) times daily -  with meals and at bedtime. (Patient taking differently: Take 1 g 4 (four) times daily -  with meals and at bedtime by mouth. Doesn't take everyday. Sometimes takes once a day) 30 tablet 1  . amLODipine (NORVASC) 5 MG tablet Take 1 tablet (5 mg total) by mouth daily. (Patient not taking: Reported on 01/18/2018) 30 tablet 0  . aspirin EC 81 MG tablet Take 1 tablet (81 mg total) by mouth daily. (Patient not taking: Reported on 01/18/2018)    . escitalopram (LEXAPRO) 10 MG tablet Take 1 tablet (10 mg total) by mouth daily. (Patient not taking: Reported on 01/18/2018) 30 tablet 0  . esomeprazole (NEXIUM) 20 MG capsule Take 20 mg by mouth daily. Sometimes takes 2-3    . HYDROcodone-acetaminophen (NORCO/VICODIN) 5-325 MG tablet Take 1 tablet by mouth every 6 (six) hours as needed for moderate pain. (Patient not taking: Reported on 10/31/2017) 20 tablet 0  . metoCLOPramide (REGLAN) 5 MG tablet Take 1 tablet (5 mg  total) by mouth 4 (four) times daily -  before meals and at bedtime. (Patient not taking: Reported on 10/31/2017) 60 tablet 0  . promethazine (PHENERGAN) 25 MG suppository Place 1 suppository (25 mg total) rectally every 6 (six) hours as needed for nausea or vomiting. (Patient not taking: Reported on 10/31/2017) 12 suppository 1   No current facility-administered medications for this visit.     ROS:   General:  No weight loss, Fever, chills  HEENT: No recent headaches, no nasal bleeding, no visual changes, no sore throat  Neurologic: No dizziness, blackouts, seizures. No  recent symptoms of stroke or mini- stroke. No recent episodes of slurred speech, or temporary blindness.  Cardiac: No recent episodes of chest pain/pressure, no shortness of breath at rest.  No shortness of breath with exertion.  Denies history of atrial fibrillation or irregular heartbeat  Vascular: No history of rest pain in feet.  No history of claudication.  No history of non-healing ulcer, No history of DVT   Pulmonary: No home oxygen, no productive cough, no hemoptysis,  No asthma or wheezing  Musculoskeletal:  [X]  Arthritis, [ ]  Low back pain,  [X]  Joint pain  Hematologic:No history of hypercoagulable state.  No history of easy bleeding.  No history of anemia  Gastrointestinal: No hematochezia or melena,  No gastroesophageal reflux, no trouble swallowing  Urinary: [ ]  chronic Kidney disease, [ ]  on HD - [ ]  MWF or [ ]  TTHS, [ ]  Burning with urination, [ ]  Frequent urination, [ ]  Difficulty urinating;   Skin: No rashes  Psychological: No history of anxiety,  No history of depression   Physical Examination  Vitals:   01/18/18 1112 01/18/18 1119  BP: (!) 183/106 131/82  Pulse: (!) 58 (!) 58  Resp: 20   Temp: 97.7 F (36.5 C)   TempSrc: Oral   SpO2: 97%   Weight: 183 lb (83 kg)   Height: 5\' 6"  (1.676 m)     Body mass index is 29.54 kg/m.  General:  Alert and oriented, no acute distress HEENT:  Normal Neck: No bruit or JVD Pulmonary: Clear to auscultation bilaterally Cardiac: Regular Rate and Rhythm without murmur Abdomen: Soft, non-tender, non-distended, no mass Skin: No rash Extremity Pulses:  2+ radial, brachial right side absent left brachial radial 2+ femoral, dorsalis pedis, posterior tibial pulses bilaterally Musculoskeletal: No deformity or edema  Neurologic: Upper and lower extremity motor 5/5 and symmetric  DATA:  She had a carotid duplex exam today.  Arm pressure could not be measured on the left side.  Right arm pressure was 139.  Evidence of subclavian stenosis on duplex as well as reversal of flow in the left vertebral artery.  Carotids were less than 40% stenosis bilaterally.  ASSESSMENT: Symptomatic left subclavian artery stenosis with exertional fatigue in the left arm.  I discussed with the patient today that I do not believe the pain that she has in the dorsum of her left wrist is related to her subclavian stenosis but certainly the early fatigue symptoms could be.  She does not have evidence of clinical steal and has no dizziness.  She was previously started on aspirin.  I started her on simvastatin today.   PLAN: Arch aortogram with left upper extremity arteriogram possible intervention on her left subclavian stenosis.  I discussed with her today the risk benefits possible complications and procedure details including but not limited to bleeding infection vessel injury contrast reaction and the possibility that she potentially would require operation rather than a percutaneous intervention if she has extensive left subclavian disease.  She understands and agrees to proceed.  This is scheduled for tomorrow January 19, 2018.   [name], MD [facility] Office: [phone] Pager: [phone]

## 2018-01-19 ENCOUNTER — Ambulatory Visit (HOSPITAL_COMMUNITY)
Admission: RE | Admit: 2018-01-19 | Discharge: 2018-01-19 | Disposition: A | Discharge status: Home / Self Care | Payer: Medicare Other | Source: Ambulatory Visit | Attending: Vascular Surgery | Admitting: Vascular Surgery

## 2018-01-19 ENCOUNTER — Ambulatory Visit (HOSPITAL_COMMUNITY)
Admission: RE | Disposition: A | Discharge status: Home / Self Care | Payer: Self-pay | Source: Ambulatory Visit | Attending: Vascular Surgery

## 2018-01-19 DIAGNOSIS — F17210 Nicotine dependence, cigarettes, uncomplicated: Secondary | ICD-10-CM | POA: Insufficient documentation

## 2018-01-19 DIAGNOSIS — Z79899 Other long term (current) drug therapy: Secondary | ICD-10-CM | POA: Insufficient documentation

## 2018-01-19 DIAGNOSIS — G8929 Other chronic pain: Secondary | ICD-10-CM | POA: Insufficient documentation

## 2018-01-19 DIAGNOSIS — M1990 Unspecified osteoarthritis, unspecified site: Secondary | ICD-10-CM | POA: Insufficient documentation

## 2018-01-19 DIAGNOSIS — Z8711 Personal history of peptic ulcer disease: Secondary | ICD-10-CM | POA: Diagnosis not present

## 2018-01-19 DIAGNOSIS — I771 Stricture of artery: Secondary | ICD-10-CM | POA: Diagnosis not present

## 2018-01-19 DIAGNOSIS — Z7982 Long term (current) use of aspirin: Secondary | ICD-10-CM | POA: Insufficient documentation

## 2018-01-19 DIAGNOSIS — K219 Gastro-esophageal reflux disease without esophagitis: Secondary | ICD-10-CM | POA: Insufficient documentation

## 2018-01-19 DIAGNOSIS — R109 Unspecified abdominal pain: Secondary | ICD-10-CM | POA: Insufficient documentation

## 2018-01-19 DIAGNOSIS — I998 Other disorder of circulatory system: Secondary | ICD-10-CM | POA: Diagnosis not present

## 2018-01-19 HISTORY — PX: UPPER EXTREMITY ANGIOGRAPHY: CATH118270

## 2018-01-19 HISTORY — PX: AORTIC ARCH ANGIOGRAPHY: CATH118224

## 2018-01-19 SURGERY — AORTIC ARCH ANGIOGRAPHY
Anesthesia: LOCAL

## 2018-01-19 MED ORDER — LABETALOL HCL 5 MG/ML IV SOLN: 10.0000 mg | INTRAVENOUS | Status: DC | PRN

## 2018-01-19 MED ORDER — SODIUM CHLORIDE 0.9% FLUSH: 3.0000 mL | Freq: Two times a day (BID) | INTRAVENOUS | Status: DC

## 2018-01-19 MED ORDER — HEPARIN (PORCINE) IN NACL 2-0.9 UNIT/ML-% IJ SOLN
INTRAMUSCULAR | Status: AC
  Filled 2018-01-19: qty 500

## 2018-01-19 MED ORDER — HEPARIN (PORCINE) IN NACL 2-0.9 UNIT/ML-% IJ SOLN
INTRAMUSCULAR | Status: AC | PRN
  Administered 2018-01-19: 1000 mL via INTRA_ARTERIAL

## 2018-01-19 MED ORDER — HYDRALAZINE HCL 20 MG/ML IJ SOLN: 5.0000 mg | INTRAMUSCULAR | Status: DC | PRN

## 2018-01-19 MED ORDER — SODIUM CHLORIDE 0.9 % IV SOLN
INTRAVENOUS | Status: AC
Start: 2018-01-19 — End: 2018-01-19

## 2018-01-19 MED ORDER — MIDAZOLAM HCL 2 MG/2ML IJ SOLN
INTRAMUSCULAR | Status: DC | PRN
  Administered 2018-01-19: 2 mg via INTRAVENOUS

## 2018-01-19 MED ORDER — LIDOCAINE HCL 1 % IJ SOLN
INTRAMUSCULAR | Status: AC
  Filled 2018-01-19: qty 20

## 2018-01-19 MED ORDER — LIDOCAINE HCL (PF) 1 % IJ SOLN
INTRAMUSCULAR | Status: DC | PRN
Start: 2018-01-19 — End: 2018-01-19
  Administered 2018-01-19: 5 mL

## 2018-01-19 MED ORDER — MIDAZOLAM HCL 2 MG/2ML IJ SOLN
INTRAMUSCULAR | Status: AC
  Filled 2018-01-19: qty 2

## 2018-01-19 MED ORDER — FENTANYL CITRATE (PF) 100 MCG/2ML IJ SOLN
INTRAMUSCULAR | Status: DC | PRN
  Administered 2018-01-19: 25 ug via INTRAVENOUS

## 2018-01-19 MED ORDER — SODIUM CHLORIDE 0.9 % IV SOLN
INTRAVENOUS | Status: DC
  Administered 2018-01-19: 11:00:00 via INTRAVENOUS

## 2018-01-19 MED ORDER — FENTANYL CITRATE (PF) 100 MCG/2ML IJ SOLN
INTRAMUSCULAR | Status: AC
  Filled 2018-01-19: qty 2

## 2018-01-19 MED ORDER — OXYCODONE HCL 5 MG PO TABS: 5.0000 mg | ORAL_TABLET | ORAL | Status: DC | PRN

## 2018-01-19 MED ORDER — MORPHINE SULFATE (PF) 10 MG/ML IV SOLN: 2.0000 mg | INTRAVENOUS | Status: DC | PRN

## 2018-01-19 MED ORDER — SODIUM CHLORIDE 0.9% FLUSH: 3.0000 mL | INTRAVENOUS | Status: DC | PRN

## 2018-01-19 MED ORDER — SODIUM CHLORIDE 0.9 % IV SOLN: 250.0000 mL | INTRAVENOUS | Status: DC | PRN

## 2018-01-19 MED ORDER — IODIXANOL 320 MG/ML IV SOLN
INTRAVENOUS | Status: DC | PRN
  Administered 2018-01-19: 75 mL via INTRA_ARTERIAL

## 2018-01-19 SURGICAL SUPPLY — 9 items
CATH ANGIO 5F PIGTAIL 100CM (CATHETERS) ×2 IMPLANT
COVER PRB 48X5XTLSCP FOLD TPE (BAG) ×1 IMPLANT
COVER PROBE 5X48 (BAG) ×1
KIT PV (KITS) ×2 IMPLANT
SHEATH PINNACLE 5F 10CM (SHEATH) ×2 IMPLANT
SYR MEDRAD MARK V 150ML (SYRINGE) ×2 IMPLANT
TRANSDUCER W/STOPCOCK (MISCELLANEOUS) ×2 IMPLANT
TRAY PV CATH (CUSTOM PROCEDURE TRAY) ×2 IMPLANT
WIRE HI TORQ VERSACORE J 260CM (WIRE) ×2 IMPLANT

## 2018-01-19 NOTE — Interval H&P Note (Signed)
History and Physical Interval Note:  01/19/2018 2:36 PM  Rita [name]  has presented today for surgery, with the diagnosis of subclavian steal  The various methods of treatment have been discussed with the patient and family. After consideration of risks, benefits and other options for treatment, the patient has consented to  Procedure(s): AORTIC ARCH ANGIOGRAPHY (N/A) UPPER EXTREMITY ANGIOGRAPHY (N/A) as a surgical intervention .  The patient's history has been reviewed, patient examined, no change in status, stable for surgery.  I have reviewed the patient's chart and labs.  Questions were answered to the patient's satisfaction.     [name]

## 2018-01-19 NOTE — Interval H&P Note (Signed)
History and Physical Interval Note:  01/19/2018 2:35 PM  Rita [name]  has presented today for surgery, with the diagnosis of subclavian steal  The various methods of treatment have been discussed with the patient and family. After consideration of risks, benefits and other options for treatment, the patient has consented to  Procedure(s): AORTIC ARCH ANGIOGRAPHY (N/A) UPPER EXTREMITY ANGIOGRAPHY (N/A) as a surgical intervention .  The patient's history has been reviewed, patient examined, no change in status, stable for surgery.  I have reviewed the patient's chart and labs.  Questions were answered to the patient's satisfaction.     [name]

## 2018-01-19 NOTE — Discharge Instructions (Signed)
° °  [facility]  Discharge Instructions  Extremity Angiogram; Angioplasty/Stenting  Please refer to the following instructions for your post-procedure care. Your surgeon or physician assistant will discuss any changes with you.  Activity  Avoid lifting more than 8 pounds (1 gallons of milk) for 72 hours (3 days) after your procedure. You may walk as much as you can tolerate. It's OK to drive after 72 hours.  Bathing/Showering  You may shower the day after your procedure. If you have a bandage, you may remove it at 24- 48 hours. Clean your incision site with mild soap and water. Pat the area dry with a clean towel.  Diet  Resume your pre-procedure diet. There are no special food restrictions following this procedure. All patients with peripheral vascular disease should follow a low fat/low cholesterol diet. In order to heal from your surgery, it is CRITICAL to get adequate nutrition. Your body requires vitamins, minerals, and protein. Vegetables are the best source of vitamins and minerals. Vegetables also provide the perfect balance of protein. Processed food has little nutritional value, so try to avoid this.  Medications  Resume taking all of your medications unless your doctor tells you not to. If your incision is causing pain, you may take over-the-counter pain relievers such as acetaminophen (Tylenol)  Follow Up  Follow up will be arranged at the time of your procedure. You may have an office visit scheduled or may be scheduled for surgery. Ask your surgeon if you have any questions.  Please call us immediately for any of the following conditions: Severe or worsening pain your legs or feet at rest or with walking. Increased pain, redness, drainage at your groin puncture site. Fever of 101 degrees or higher. If you have any mild or slow bleeding from your puncture site: lie down, apply firm constant pressure over the area with a piece of gauze  or a clean wash cloth for 30 minutes- no peeking!, call 911 right away if you are still bleeding after 30 minutes, or if the bleeding is heavy and unmanageable.  Reduce your risk factors of vascular disease:  Stop smoking. If you would like help call [facility] at 1-800-QUIT-NOW (630)411-8267) or [place] at [phone]. Manage your cholesterol Maintain a desired weight Control your diabetes Keep your blood pressure down  If you have any questions, please call the office at [phone]

## 2018-01-19 NOTE — Op Note (Signed)
Procedure: Ultrasound right groin arch aortogram  Preoperative diagnosis: Left hand ischemia  Postoperative diagnosis: Same  Anesthesia: Local with IV sedation  Operative findings: #1 normal arch anatomy #2 occlusion left subclavian artery with reversal of flow left vertebral reconstituting the distal left subclavian artery #3 patent left common right common and right subclavian arteries  Operative details: After obtaining informed consent, the patient was brought to the [facility] lab.  The patient was placed in supine position Angio table.  Both groins were prepped and draped in usual sterile fashion.  Ultrasound was used to identify the right common femoral artery and femoral bifurcation.  An introducer needle was used to cannulate the right common femoral artery without difficulty.  An 035 versacore wire was then threaded up the abdominal aorta under fluoroscopic guidance.  A 5 French sheath was placed over the guidewire in the right common femoral artery.  A 5 French pigtail catheter was then advanced over the guidewire and these were advanced as a unit up into the ascending aorta.  An arch aortogram was then obtained in a 40 degree LAO projection.  This solution normal arch anatomy with a patent right common right subclavian right vertebral artery.  The left common carotid artery is patent.  The left carotid bifurcation is well visualized and widely patent.  The left subclavian artery is occluded just after its origin.  It then reconstitutes through a reversed flow in the left vertebral artery and the visualized portions of the left subclavian artery are patent distally.  One additional view was performed with the pico catheter adjacent to the origin of the left subclavian artery.  I did not see a channel within the artery to consider angioplasty or stenting.  At this point the pigtail catheter was removed over a guidewire.  The 5 French sheath was left in place to be pulled in the holding area.  The  patient tolerated procedure well and there were no complications.  The patient was taken to the holding area in stable condition.  Operative management: The patient will be scheduled for a left carotid to subclavian bypass in the near future after cardiology risk stratification.  [name], MD [facility] Office: [phone] Pager: [phone]

## 2018-01-19 NOTE — Progress Notes (Signed)
Site area: Right groin a 5 french arterial sheath was removed  Site Prior to Removal:  Level 0  Pressure Applied For 20 MINUTES    Bedrest Beginning at 1300  Manual:   Yes.    Patient Status During Pull:  stable  Post Pull Groin Site:  Level 0  Post Pull Instructions Given:  Yes.    Post Pull Pulses Present:  Yes.    Dressing Applied:  Yes.    Comments:  VS remain stable

## 2018-01-21 ENCOUNTER — Encounter (HOSPITAL_COMMUNITY): Payer: Self-pay | Admitting: Vascular Surgery

## 2018-01-22 LAB — CBC WITH DIFFERENTIAL/PLATELET
BASOS PCT: 0.5 %
Basophils Absolute: 39 cells/uL (ref 0–200)
Eosinophils Absolute: 162 cells/uL (ref 15–500)
Eosinophils Relative: 2.1 %
HEMATOCRIT: 42.3 % (ref 35.0–45.0)
Hemoglobin: 14.8 g/dL (ref 11.7–15.5)
LYMPHS ABS: 2402 {cells}/uL (ref 850–3900)
MCH: 30 pg (ref 27.0–33.0)
MCHC: 35 g/dL (ref 32.0–36.0)
MCV: 85.8 fL (ref 80.0–100.0)
MPV: 10.2 fL (ref 7.5–12.5)
Monocytes Relative: 4.8 %
Neutro Abs: 4728 cells/uL (ref 1500–7800)
Neutrophils Relative %: 61.4 %
PLATELETS: 376 10*3/uL (ref 140–400)
RBC: 4.93 10*6/uL (ref 3.80–5.10)
RDW: 13.3 % (ref 11.0–15.0)
TOTAL LYMPHOCYTE: 31.2 %
WBC: 7.7 10*3/uL (ref 3.8–10.8)
WBCMIX: 370 {cells}/uL (ref 200–950)

## 2018-01-22 LAB — HEPATITIS PANEL, ACUTE
Hep A IgM: NONREACTIVE
Hep B C IgM: NONREACTIVE
Hepatitis B Surface Ag: NONREACTIVE
Hepatitis C Ab: REACTIVE — AB
SIGNAL TO CUT-OFF: 11.5 — ABNORMAL HIGH (ref <1.00)

## 2018-01-22 LAB — HEPATIC FUNCTION PANEL
AG RATIO: 1.4 (calc) (ref 1.0–2.5)
ALKALINE PHOSPHATASE (APISO): 66 U/L (ref 33–130)
ALT: 26 U/L (ref 6–29)
AST: 25 U/L (ref 10–35)
Albumin: 4.2 g/dL (ref 3.6–5.1)
BILIRUBIN DIRECT: 0 mg/dL (ref 0.0–0.2)
BILIRUBIN TOTAL: 0.3 mg/dL (ref 0.2–1.2)
Globulin: 3.1 g/dL (calc) (ref 1.9–3.7)
Indirect Bilirubin: 0.3 mg/dL (calc) (ref 0.2–1.2)
Total Protein: 7.3 g/dL (ref 6.1–8.1)

## 2018-01-22 LAB — LIPID PANEL
CHOL/HDL RATIO: 6.5 (calc) — AB (ref <5.0)
CHOLESTEROL: 272 mg/dL — AB (ref <200)
HDL: 42 mg/dL — ABNORMAL LOW (ref >50)
LDL CHOLESTEROL (CALC): 171 mg/dL — AB
Non-HDL Cholesterol (Calc): 230 mg/dL (calc) — ABNORMAL HIGH (ref <130)
Triglycerides: 348 mg/dL — ABNORMAL HIGH (ref <150)

## 2018-01-22 LAB — HEMOGLOBIN A1C
EAG (MMOL/L): 6 (calc)
Hgb A1c MFr Bld: 5.4 % of total Hgb (ref <5.7)
Mean Plasma Glucose: 108 (calc)

## 2018-01-22 LAB — BASIC METABOLIC PANEL
BUN: 9 mg/dL (ref 7–25)
CO2: 29 mmol/L (ref 20–32)
Calcium: 9.7 mg/dL (ref 8.6–10.4)
Chloride: 100 mmol/L (ref 98–110)
Creat: 0.83 mg/dL (ref 0.50–1.05)
GLUCOSE: 91 mg/dL (ref 65–99)
POTASSIUM: 3.4 mmol/L — AB (ref 3.5–5.3)
SODIUM: 142 mmol/L (ref 135–146)

## 2018-01-22 LAB — TSH: TSH: 3.78 mIU/L

## 2018-01-22 LAB — RPR: RPR Ser Ql: NONREACTIVE

## 2018-01-22 LAB — HCV RNA,QUANTITATIVE REAL TIME PCR
HCV QUANT LOG: NOT DETECTED {Log_IU}/mL
HCV RNA, PCR, QN: <15 IU/mL

## 2018-01-22 LAB — HIV ANTIBODY (ROUTINE TESTING W REFLEX): HIV 1&2 Ab, 4th Generation: NONREACTIVE

## 2018-01-22 MED FILL — Lidocaine HCl Local Inj 1%: INTRAMUSCULAR | Qty: 20 | Status: AC

## 2018-01-23 ENCOUNTER — Telehealth: Payer: Self-pay | Admitting: Family Medicine

## 2018-01-23 MED ORDER — POTASSIUM CHLORIDE ER 10 MEQ PO CPCR: 10.0000 meq | ORAL_CAPSULE | Freq: Every day | ORAL | 0 refills | Status: DC

## 2018-01-23 NOTE — Telephone Encounter (Signed)
Please call and advise that her potassium is low. I have called in [place] 10 meq, 1 po qd. She needs to start this medication and keep her follow up appointment. When does she have a follow up scheduled? We will discuss her labs at that time. Her cholesterol is very high and her thyroid is borderline.   Please advise to follow up within the next month.  Gwen Her. Mannie Stabile, MD

## 2018-01-23 NOTE — Telephone Encounter (Signed)
Patient informed of message below, verbalized understanding. Appointment 01/29/18 at 10:20

## 2018-01-26 ENCOUNTER — Ambulatory Visit: Payer: Medicare Other | Admitting: Cardiovascular Disease

## 2018-01-29 ENCOUNTER — Ambulatory Visit: Payer: Medicare Other | Admitting: Family Medicine

## 2018-02-08 ENCOUNTER — Ambulatory Visit (INDEPENDENT_AMBULATORY_CARE_PROVIDER_SITE_OTHER): Payer: Medicare Other | Admitting: Family Medicine

## 2018-02-08 ENCOUNTER — Encounter: Payer: Self-pay | Admitting: Family Medicine

## 2018-02-08 VITALS — BP 124/80 | HR 69 | Resp 16 | Ht 66.0 in | Wt 183.0 lb

## 2018-02-08 DIAGNOSIS — E785 Hyperlipidemia, unspecified: Secondary | ICD-10-CM | POA: Diagnosis not present

## 2018-02-08 DIAGNOSIS — K3184 Gastroparesis: Secondary | ICD-10-CM

## 2018-02-08 DIAGNOSIS — E876 Hypokalemia: Secondary | ICD-10-CM

## 2018-02-08 DIAGNOSIS — F5105 Insomnia due to other mental disorder: Secondary | ICD-10-CM

## 2018-02-08 DIAGNOSIS — R110 Nausea: Secondary | ICD-10-CM

## 2018-02-08 DIAGNOSIS — F99 Mental disorder, not otherwise specified: Secondary | ICD-10-CM

## 2018-02-08 LAB — BASIC METABOLIC PANEL
BUN: 8 mg/dL (ref 7–25)
CO2: 31 mmol/L (ref 20–32)
CREATININE: 0.94 mg/dL (ref 0.50–1.05)
Calcium: 9.2 mg/dL (ref 8.6–10.4)
Chloride: 102 mmol/L (ref 98–110)
GLUCOSE: 95 mg/dL (ref 65–139)
Potassium: 3.2 mmol/L — ABNORMAL LOW (ref 3.5–5.3)
Sodium: 141 mmol/L (ref 135–146)

## 2018-02-08 MED ORDER — TRAZODONE HCL 50 MG PO TABS: 50.0000 mg | ORAL_TABLET | Freq: Every day | ORAL | 3 refills | Status: DC

## 2018-02-08 MED ORDER — ATORVASTATIN CALCIUM 40 MG PO TABS: 40.0000 mg | ORAL_TABLET | Freq: Every day | ORAL | 3 refills | Status: DC

## 2018-02-08 MED ORDER — METOCLOPRAMIDE HCL 5 MG PO TABS: 5.0000 mg | ORAL_TABLET | Freq: Three times a day (TID) | ORAL | 0 refills | Status: DC

## 2018-02-08 MED ORDER — ONDANSETRON HCL 4 MG PO TABS: 4.0000 mg | ORAL_TABLET | Freq: Three times a day (TID) | ORAL | 0 refills | Status: DC | PRN

## 2018-02-08 NOTE — Patient Instructions (Addendum)
Start the trazodone for sleep at night Continue the lexapro for mood/worry  Stop the simvastatin and start atrovastatin for cholestoerol   Continue BP medication-amlodipine  Ondansetron for nausea, may use as needed  The reglan/metoclopramide is for stomach to empty and will help with nausea. , take before meals.

## 2018-02-08 NOTE — Progress Notes (Signed)
Patient ID: Rita [name], female    DOB: 1966/06/12, 52 y.o.   MRN: [id number]  Chief Complaint  Patient presents with  . Nausea    constant nausea for the past few weeks, worsening   . Depression    Allergies Patient has no known allergies.  Subjective:   Rita [name] is a 52 y.o. female who presents to [facility] today.  HPI Here for follow up.  She reports that she has had a lot going on. Reports that does not feel good.  For the past several weeks feels nauseated all day. Vomiting on occasion, but very rarely.  Denies any abdominal pain.  Reports that her bowel movements are once a day but sometimes small quantity.  Has had issues with chronic nausea and has been followed by gastroenterology.  She reports that she never started the medication, Reglan, that was given to her by Dr. Buford Dresser.  She reports that she did use Phenergan suppositories in the past.  She does admit to having a diagnosis of gastroparesis in the past.  She reports that her nausea is throughout the day and not specifically related to meals or before or after eating.  She reports she is urinating well.  Denies any dysuria.  No cough.  Denies any shortness of breath.  No dyspnea on exertion.  Reports that she has not had any fevers.  Denies any upper respiratory symptoms.  Patient reports that for the past several months she has felt down, depressed and sad.  She does not feel hopeless.  She reports that she has been crying on a daily basis.  She reports that her symptoms are better with the Lexapro for the past month but still not all where she would like her mood to be.  She denies any suicidal or homicidal ideations.  She denies any auditory or visual hallucinations.  She denies any drug use.  She is tried to cut down on her cigarette use.  She reports that she does not sleep well.  She reports her mind races at night and she finds it difficult to sleep.  She reports that she worries about everything in  her life.  She worries about things that she cannot control.    Past Medical History:  Diagnosis Date  . Arthritis   . Barrett's esophagus with esophagitis 03/26/2013  . Chronic abdominal pain   . Chronic back pain   . GERD (gastroesophageal reflux disease)   . Heart murmur   . Hiatal hernia   . Pancreatitis   . Peptic ulcer   . Tobacco use 03/26/2013    Past Surgical History:  Procedure Laterality Date  . AORTIC ARCH ANGIOGRAPHY N/A 01/19/2018   Procedure: AORTIC ARCH ANGIOGRAPHY;  Surgeon: Elam Dutch, MD;  Location: [facility];  Service: Cardiovascular;  Laterality: N/A;  . BIOPSY  04/17/2017   Procedure: BIOPSY;  Surgeon: Daneil Dolin, MD;  Location[facility];  Service: Endoscopy;;  esophageal  . CESAREAN SECTION    . CHOLECYSTECTOMY    . COLONOSCOPY WITH PROPOFOL N/A 04/17/2017   Procedure: COLONOSCOPY WITH PROPOFOL;  Surgeon: Daneil Dolin, MD;  Location: [facility];  Service: Endoscopy;  Laterality: N/A;  100  . ESOPHAGOGASTRODUODENOSCOPY  Oct 2011   Dr. Laural Golden: ulcer in distal esophagus, soft stricture at GE junction s/p balloon dilation PATH: BARRETT'S  . ESOPHAGOGASTRODUODENOSCOPY (EGD) WITH ESOPHAGEAL DILATION N/A 03/27/2013   Procedure: ESOPHAGOGASTRODUODENOSCOPY (EGD) WITH ESOPHAGEAL DILATION;  Surgeon: Daneil Dolin, MD;  [facility];  Service: Endoscopy;  Laterality: N/A;  possible dilation  . ESOPHAGOGASTRODUODENOSCOPY (EGD) WITH PROPOFOL N/A 01/02/2017   Procedure: ESOPHAGOGASTRODUODENOSCOPY (EGD) WITH PROPOFOL;  Surgeon: Daneil Dolin, MD;  [facility];  Service: Endoscopy;  Laterality: N/A;  with possible esophageal dilation  . ESOPHAGOGASTRODUODENOSCOPY (EGD) WITH PROPOFOL N/A 04/17/2017   Procedure: ESOPHAGOGASTRODUODENOSCOPY (EGD) WITH PROPOFOL;  Surgeon: Daneil Dolin, MD;  [facility];  Service: Endoscopy;  Laterality: N/A;  . EUS N/A 05/09/2013   Procedure: UPPER ENDOSCOPIC ULTRASOUND (EUS) LINEAR;   Surgeon: Milus Banister, MD;  Location: [facility];  Service: Endoscopy;  Laterality: N/A;  Venia Minks DILATION N/A 04/17/2017   Procedure: Venia Minks DILATION;  Surgeon: Daneil Dolin, MD;  [facility];  Service: Endoscopy;  Laterality: N/A;  . POLYPECTOMY  04/17/2017   Procedure: POLYPECTOMY;  Surgeon: Daneil Dolin, MD;  [facility];  Service: Endoscopy;;  . TUBAL LIGATION    . UPPER EXTREMITY ANGIOGRAPHY N/A 01/19/2018   Procedure: UPPER EXTREMITY ANGIOGRAPHY;  Surgeon: Elam Dutch, MD;  Location: [facility];  Service: Cardiovascular;  Laterality: N/A;    Family History  Problem Relation Age of Onset  . Asthma Mother   . Heart failure Mother   . Cancer Mother        pancreatic  . Diabetes Mother   . Hypertension Mother   . Stroke Mother   . Pancreatic cancer Mother        deceased  . Heart failure Father   . Diabetes Father   . Colon cancer Neg Hx      Social History   Socioeconomic History  . Marital status: Single    Spouse name: None  . Number of children: None  . Years of education: None  . Highest education level: None  Social Needs  . Financial resource strain: None  . Food insecurity - worry: None  . Food insecurity - inability: None  . Transportation needs - medical: None  . Transportation needs - non-medical: None  Occupational History  . Occupation: farm  Tobacco Use  . Smoking status: Current Every Day Smoker    Packs/day: 1.50    Years: 20.00    Pack years: 30.00    Types: Cigarettes  . Smokeless tobacco: Never Used  Substance and Sexual Activity  . Alcohol use: No  . Drug use: No  . Sexual activity: Yes    Partners: Male    Birth control/protection: Surgical  Other Topics Concern  . None  Social History Narrative   Lives in [place], [place].   Is on disability, was a Psychologist, sport and exercise and drove tractors.    Wears seatbelt.   Cannot eat meat due to esophagus.   Smokes cigarettes.   Drinks sodas, 1/2 liter a day.     3 boys and 1 girl. 2 boys live in [place]. 1 son died in 50th month of pregnancy.   Live with friend and his wife at this time.   Used to be married and was abandoned, he would not give her a divorce.      Current Outpatient Medications on File Prior to Visit  Medication Sig Dispense Refill  . amLODipine (NORVASC) 5 MG tablet Take 1 tablet (5 mg total) by mouth daily. 30 tablet 0  . aspirin EC 81 MG tablet Take 1 tablet (81 mg total) by mouth daily.    Marland Kitchen dexlansoprazole (DEXILANT) 60  MG capsule Take 60 mg by mouth daily.    Marland Kitchen escitalopram (LEXAPRO) 10 MG tablet Take 1 tablet (10 mg total) by mouth daily. 30 tablet 0  . potassium chloride (MICRO-K) 10 MEQ CR capsule Take 1 capsule (10 mEq total) by mouth daily. 30 capsule 0  . HYDROcodone-acetaminophen (NORCO/VICODIN) 5-325 MG tablet Take 1 tablet by mouth every 6 (six) hours as needed for moderate pain. (Patient not taking: Reported on 10/31/2017) 20 tablet 0  . ibuprofen (ADVIL,MOTRIN) 200 MG tablet Take 400 mg by mouth every 6 (six) hours as needed for headache or moderate pain.    Marland Kitchen metoCLOPramide (REGLAN) 5 MG tablet Take 1 tablet (5 mg total) by mouth 4 (four) times daily -  before meals and at bedtime. (Patient not taking: Reported on 10/31/2017) 60 tablet 0  . naproxen sodium (ALEVE) 220 MG tablet Take 220 mg by mouth daily as needed (for pain or headache).    . promethazine (PHENERGAN) 25 MG suppository Place 1 suppository (25 mg total) rectally every 6 (six) hours as needed for nausea or vomiting. (Patient not taking: Reported on 10/31/2017) 12 suppository 1  . ranitidine (ZANTAC) 150 MG tablet Take 1 tablet (150 mg total) by mouth at bedtime. (Patient taking differently: Take 150 mg by mouth daily as needed for heartburn. ) 30 tablet 3  . sucralfate (CARAFATE) 1 g tablet Take 1 tablet (1 g total) by mouth 4 (four) times daily -  with meals and at bedtime. (Patient not taking: Reported on 01/18/2018) 30 tablet 1   No current  facility-administered medications on file prior to visit.     Review of Systems  Constitutional: Positive for fatigue. Negative for chills, diaphoresis, fever and unexpected weight change.  HENT: Negative for dental problem, mouth sores, nosebleeds, rhinorrhea, sinus pressure, sinus pain, sneezing, trouble swallowing and voice change.   Respiratory: Negative for cough, chest tightness and shortness of breath.   Cardiovascular: Negative for chest pain, palpitations and leg swelling.  Gastrointestinal: Positive for nausea. Negative for abdominal distention, blood in stool, constipation, diarrhea, rectal pain and vomiting.  Genitourinary: Negative for difficulty urinating, dysuria, frequency, hematuria and urgency.  Musculoskeletal: Negative for arthralgias and neck stiffness.  Skin: Negative for rash.  Neurological: Negative for dizziness and light-headedness.  Psychiatric/Behavioral: Positive for dysphoric mood and sleep disturbance. Negative for agitation, behavioral problems, decreased concentration and suicidal ideas. The patient is nervous/anxious.      Objective:   BP 124/80   Pulse 69   Resp 16   Ht 5\' 6"  (1.676 m)   Wt 183 lb (83 kg)   LMP 02/09/2012   SpO2 98%   BMI 29.54 kg/m   Physical Exam  Constitutional: She is oriented to person, place, and time. She appears well-developed and well-nourished.  HENT:  Head: Normocephalic and atraumatic.  Right Ear: External ear normal.  Mouth/Throat: No oropharyngeal exudate.  Eyes: EOM are normal. Pupils are equal, round, and reactive to light. No scleral icterus.  Neck: Normal range of motion. Neck supple. No JVD present. No tracheal deviation present.  Cardiovascular: Normal rate and regular rhythm.  Pulmonary/Chest: Effort normal and breath sounds normal. No respiratory distress.  Abdominal: Soft. Bowel sounds are normal. She exhibits no distension. There is no tenderness. There is no rebound.  Musculoskeletal: Normal range of  motion. She exhibits no edema.  Lymphadenopathy:    She has no cervical adenopathy.  Neurological: She is alert and oriented to person, place, and time. No cranial nerve deficit.  Skin: Skin is warm and dry. No rash noted.  Psychiatric: Her behavior is normal. Judgment and thought content normal.  Vitals reviewed.    Assessment and Plan  1. Hypokalemia -Patient with a history of chronic hypokalemia.  She has been consistently taking 10 mEq/day.  Check BMP today.  Reduction in her LDL. - Basic Metabolic Panel (BMET)  2. Hyperlipidemia LDL goal <70 Was started on simvastatin 10 mg by surgery. D/c at this time. Patient needs approximately 50% start atorvastatin 40 mg p.o. Daily.  Hyperlipidemia and the associated risk of ASCVD were discussed today. Primary vs. Secondary prevention of ASCVD were discussed and how it relates to patient morbidity, mortality, and quality of life. Shared decision making with patient including the risks of statins vs.benefits of ASCVD risk reduction discussed.  Risks of stains discussed including myopathy, rhabdomyoloysis, liver problems, increased risk of diabetes discussed. We discussed heart healthy diet, lifestyle modifications, risk factor modifications, and adherence to the recommended treatment plan. We discussed the need to periodically monitor lipid panel and liver function tests while on statin therapy.   - atorvastatin (LIPITOR) 40 MG tablet; Take 1 tablet (40 mg total) by mouth daily.  Dispense: 90 tablet; Refill: 3  3. Chronic nausea Patient has a history of chronic nausea which is been worked up by GI.  She is a patient of Dr. Guerry Minors.  We discussed today that her nausea could be secondary to the Lexapro.  However her symptoms have been this severe for much longer than the duration that she has been taking the Lexapro.  It is been suspected that her nausea is secondary to gastroparesis.  She is used Phenergan in the past.  However, due to the sedation  factors I am not excited about giving her this medication at this time.  We will try Zofran at this time.  She was counseled concerning the risks versus benefits of this medication.  We did discuss that it does have dopaminergic side effects which can also happen with the Reglan.  She will uses Zofran sparingly.  She also has a follow-up appointment in the next month with Dr. Buford Dresser. - ondansetron (ZOFRAN) 4 MG tablet; Take 1 tablet (4 mg total) by mouth every 8 (eight) hours as needed for nausea or vomiting.  Dispense: 20 tablet; Refill: 0  4. Gastroparesis Patient has a history of gastroparesis.  I have reviewed the office visits notes from gastroenterology today.  She has been given this medication on several occasions, however she has never taken it.  She reports that she does not know that she was prescribed this medication.  However, office visit notes do read that she was given this medication and counseled regarding the side effects including tardive dyskinesia.  We discussed this today.  She will start this medicine 3 times a day and will see if it helps with her symptoms. - metoCLOPramide (REGLAN) 5 MG tablet; Take 1 tablet (5 mg total) by mouth 3 (three) times daily before meals.  Dispense: 90 tablet; Refill: 0  5. Insomnia due to other mental disorder Sedation precautions of trazodone discussed.  She was told if she takes this medication she should not drive or operate heavy machinery.  She will be using it at bedtime for sleep.  She was told not to mix this medication with alcohol or any other controlled substances.  Will start this medication also to help augment her mood in addition to sedation benefits.  She was told if she develops any mood changes,  suicidal or homicidal ideations that she should contact medical help.  She voiced understanding.  She will continue the Lexapro at this time.  She will follow-up in 1 month or sooner if needed.  She will also call with any questions or  concerns. - traZODone (DESYREL) 50 MG tablet; Take 1 tablet (50 mg total) by mouth at bedtime.  Dispense: 30 tablet; Refill: 3  Return in about 4 weeks (around 03/08/2018) for follow up. Caren Macadam, MD 02/09/2018

## 2018-02-09 ENCOUNTER — Telehealth: Payer: Self-pay | Admitting: Family Medicine

## 2018-02-09 DIAGNOSIS — E876 Hypokalemia: Secondary | ICD-10-CM

## 2018-02-09 MED ORDER — POTASSIUM CHLORIDE CRYS ER 20 MEQ PO TBCR: 20.0000 meq | EXTENDED_RELEASE_TABLET | Freq: Every day | ORAL | 3 refills | Status: DC

## 2018-02-09 NOTE — Telephone Encounter (Signed)
Patient informed of message below, verbalized understanding.  

## 2018-02-09 NOTE — Telephone Encounter (Signed)
Please call patient and advised that her potassium is still low.  She has been on 10 mEq a day.  Advised her that I am increasing her to 20 mEq a day.  I have sent in the new dosing to the pharmacy.  If she does have the 10 milli-equivalent tablets she can take 2 pills at the same time.  Please confirm with patient that she has just been taking 10 mEq of Micro-K a day.  Advised her that she needs to come back to the lab in 7-10 days and repeat her potassium and we will need to increase if it is not better.

## 2018-02-14 ENCOUNTER — Ambulatory Visit (INDEPENDENT_AMBULATORY_CARE_PROVIDER_SITE_OTHER): Payer: Medicare Other | Admitting: Cardiovascular Disease

## 2018-02-14 ENCOUNTER — Other Ambulatory Visit: Payer: Self-pay

## 2018-02-14 ENCOUNTER — Encounter: Payer: Self-pay | Admitting: Cardiovascular Disease

## 2018-02-14 ENCOUNTER — Telehealth: Payer: Self-pay | Admitting: Cardiovascular Disease

## 2018-02-14 ENCOUNTER — Encounter: Payer: Self-pay | Admitting: *Deleted

## 2018-02-14 VITALS — BP 146/84 | HR 69 | Ht 66.0 in | Wt 191.0 lb

## 2018-02-14 DIAGNOSIS — Z01818 Encounter for other preprocedural examination: Secondary | ICD-10-CM | POA: Diagnosis not present

## 2018-02-14 DIAGNOSIS — I10 Essential (primary) hypertension: Secondary | ICD-10-CM | POA: Diagnosis not present

## 2018-02-14 DIAGNOSIS — R0609 Other forms of dyspnea: Secondary | ICD-10-CM | POA: Diagnosis not present

## 2018-02-14 DIAGNOSIS — R011 Cardiac murmur, unspecified: Secondary | ICD-10-CM | POA: Diagnosis not present

## 2018-02-14 DIAGNOSIS — Z720 Tobacco use: Secondary | ICD-10-CM | POA: Diagnosis not present

## 2018-02-14 DIAGNOSIS — E785 Hyperlipidemia, unspecified: Secondary | ICD-10-CM

## 2018-02-14 NOTE — Progress Notes (Signed)
CARDIOLOGY CONSULT NOTE  Patient ID: JANIQUA FRISCIA MRN: [id number] DOB/AGE: 53-Jun-1967 53 y.o.  Admit date: (Not on file) Primary Physician: Caren Macadam, MD Referring Physician: Dr. [name]  Reason for Consultation: Preoperative risk stratification  HPI: Rita [name] is a 52 y.o. female who is being seen today for the evaluation of preoperative risk stratification at the request of Dr. [name].   She was evaluated by Dr. Oneida Alar on 01/19/18 and underwent aortic arch angiography.  There was noted to be occlusion of the left subclavian artery.  She is being scheduled to undergo left carotid to subclavian bypass and has been referred for preoperative risk stratification.  Past medical history also includes hypertension and chronic nausea and is followed by gastroenterology.  Carotid Dopplers on 01/18/18 showed bilateral 1-39% internal carotid artery stenosis.  Lipids 01/17/18: Total cholesterol 272, HDL 42, triglycerides 348, LDL 171.  She was recently switched from simvastatin 10 mg to Lipitor 40 mg by her PCP on 02/08/18.  A CT of the abdomen and pelvis on 12/30/16 demonstrated aortic atherosclerosis.  It also demonstrated a small hiatal hernia and hepatic steatosis.  She said she has smoked over a pack of cigarettes daily for several years but is now down to less than 1/2 pack of cigarettes daily and is trying to quit.  She has exertional dyspnea which is been going on for some time.  She has occasional chest discomfort in the retrosternal region and on the right side of her chest.  This only occurs when she is very active and is seldom in occurrence.  She denies orthopnea, leg swelling, paroxysmal nocturnal dyspnea.  Her primary complaints relate to left arm pain and now right arm pain.  She feels numbness in her arms when waking up.  She also said "I hear my heartbeat ".  Family history: Father developed coronary artery disease in his 74s and sees Dr. Harl Bowie.  Her  mother had an MI at age 77.   No Known Allergies  Current Outpatient Medications  Medication Sig Dispense Refill  . amLODipine (NORVASC) 5 MG tablet Take 1 tablet (5 mg total) by mouth daily. 30 tablet 0  . aspirin EC 81 MG tablet Take 1 tablet (81 mg total) by mouth daily.    Marland Kitchen atorvastatin (LIPITOR) 40 MG tablet Take 1 tablet (40 mg total) by mouth daily. 90 tablet 3  . dexlansoprazole (DEXILANT) 60 MG capsule Take 60 mg by mouth daily.    Marland Kitchen escitalopram (LEXAPRO) 10 MG tablet Take 1 tablet (10 mg total) by mouth daily. 30 tablet 0  . ibuprofen (ADVIL,MOTRIN) 200 MG tablet Take 400 mg by mouth every 6 (six) hours as needed for headache or moderate pain.    Marland Kitchen metoCLOPramide (REGLAN) 5 MG tablet Take 1 tablet (5 mg total) by mouth 3 (three) times daily before meals. 90 tablet 0  . ondansetron (ZOFRAN) 4 MG tablet Take 1 tablet (4 mg total) by mouth every 8 (eight) hours as needed for nausea or vomiting. 20 tablet 0  . potassium chloride SA (K-DUR,KLOR-CON) 20 MEQ tablet Take 20 mEq by mouth daily.    . traZODone (DESYREL) 50 MG tablet Take 1 tablet (50 mg total) by mouth at bedtime. 30 tablet 3   No current facility-administered medications for this visit.     Past Medical History:  Diagnosis Date  . Arthritis   . Barrett's esophagus with esophagitis 03/26/2013  . Chronic abdominal pain   .  Chronic back pain   . GERD (gastroesophageal reflux disease)   . Heart murmur   . Hiatal hernia   . Pancreatitis   . Peptic ulcer   . Tobacco use 03/26/2013    Past Surgical History:  Procedure Laterality Date  . AORTIC ARCH ANGIOGRAPHY N/A 01/19/2018   Procedure: AORTIC ARCH ANGIOGRAPHY;  Surgeon: Elam Dutch, MD;  Location: [facility];  Service: Cardiovascular;  Laterality: N/A;  . BIOPSY  04/17/2017   Procedure: BIOPSY;  Surgeon: Daneil Dolin, MD;  Location: [facility];  Service: Endoscopy;;  esophageal  . CESAREAN SECTION    . CHOLECYSTECTOMY    . COLONOSCOPY WITH  PROPOFOL N/A 04/17/2017   Procedure: COLONOSCOPY WITH PROPOFOL;  Surgeon: Daneil Dolin, MD;  Location: [facility];  Service: Endoscopy;  Laterality: N/A;  100  . ESOPHAGOGASTRODUODENOSCOPY  Oct 2011   Dr. Laural Golden: ulcer in distal esophagus, soft stricture at GE junction s/p balloon dilation PATH: BARRETT'S  . ESOPHAGOGASTRODUODENOSCOPY (EGD) WITH ESOPHAGEAL DILATION N/A 03/27/2013   Procedure: ESOPHAGOGASTRODUODENOSCOPY (EGD) WITH ESOPHAGEAL DILATION;  Surgeon: Daneil Dolin, MD;  Location: [facility];  Service: Endoscopy;  Laterality: N/A;  possible dilation  . ESOPHAGOGASTRODUODENOSCOPY (EGD) WITH PROPOFOL N/A 01/02/2017   Procedure: ESOPHAGOGASTRODUODENOSCOPY (EGD) WITH PROPOFOL;  Surgeon: Daneil Dolin, MD;  Location: [facility];  Service: Endoscopy;  Laterality: N/A;  with possible esophageal dilation  . ESOPHAGOGASTRODUODENOSCOPY (EGD) WITH PROPOFOL N/A 04/17/2017   Procedure: ESOPHAGOGASTRODUODENOSCOPY (EGD) WITH PROPOFOL;  Surgeon: Daneil Dolin, MD;  Location: [facility];  Service: Endoscopy;  Laterality: N/A;  . EUS N/A 05/09/2013   Procedure: UPPER ENDOSCOPIC ULTRASOUND (EUS) LINEAR;  Surgeon: Milus Banister, MD;  Location: [facility];  Service: Endoscopy;  Laterality: N/A;  Venia Minks DILATION N/A 04/17/2017   Procedure: Venia Minks DILATION;  Surgeon: Daneil Dolin, MD;  Location: [facility];  Service: Endoscopy;  Laterality: N/A;  . POLYPECTOMY  04/17/2017   Procedure: POLYPECTOMY;  Surgeon: Daneil Dolin, MD;  Location: [facility];  Service: Endoscopy;;  . TUBAL LIGATION    . UPPER EXTREMITY ANGIOGRAPHY N/A 01/19/2018   Procedure: UPPER EXTREMITY ANGIOGRAPHY;  Surgeon: Elam Dutch, MD;  Location: [facility];  Service: Cardiovascular;  Laterality: N/A;    Social History   Socioeconomic History  . Marital status: Single    Spouse name: Not on file  . Number of children: Not on file  . Years of education: Not on file  . Highest education level: Not on  file  Social Needs  . Financial resource strain: Not on file  . Food insecurity - worry: Not on file  . Food insecurity - inability: Not on file  . Transportation needs - medical: Not on file  . Transportation needs - non-medical: Not on file  Occupational History  . Occupation: farm  Tobacco Use  . Smoking status: Current Every Day Smoker    Packs/day: 1.50    Years: 20.00    Pack years: 30.00    Types: Cigarettes    Start date: 09/26/1982  . Smokeless tobacco: Never Used  Substance and Sexual Activity  . Alcohol use: No  . Drug use: No  . Sexual activity: Yes    Partners: Male    Birth control/protection: Surgical  Other Topics Concern  . Not on file  Social History Narrative   Lives in [place], [place].   Is on disability, was a Psychologist, sport and exercise and drove tractors.  Wears seatbelt.   Cannot eat meat due to esophagus.   Smokes cigarettes.   Drinks sodas, 1/2 liter a day.    3 boys and 1 girl. 2 boys live in [place]. 1 son died in 41th month of pregnancy.   Live with friend and his wife at this time.   Used to be married and was abandoned, he would not give her a divorce.         Current Meds  Medication Sig  . amLODipine (NORVASC) 5 MG tablet Take 1 tablet (5 mg total) by mouth daily.  Marland Kitchen aspirin EC 81 MG tablet Take 1 tablet (81 mg total) by mouth daily.  Marland Kitchen atorvastatin (LIPITOR) 40 MG tablet Take 1 tablet (40 mg total) by mouth daily.  Marland Kitchen dexlansoprazole (DEXILANT) 60 MG capsule Take 60 mg by mouth daily.  Marland Kitchen escitalopram (LEXAPRO) 10 MG tablet Take 1 tablet (10 mg total) by mouth daily.  Marland Kitchen ibuprofen (ADVIL,MOTRIN) 200 MG tablet Take 400 mg by mouth every 6 (six) hours as needed for headache or moderate pain.  Marland Kitchen metoCLOPramide (REGLAN) 5 MG tablet Take 1 tablet (5 mg total) by mouth 3 (three) times daily before meals.  . ondansetron (ZOFRAN) 4 MG tablet Take 1 tablet (4 mg total) by mouth every 8 (eight) hours as needed for nausea or vomiting.  . potassium chloride SA  (K-DUR,KLOR-CON) 20 MEQ tablet Take 20 mEq by mouth daily.  . traZODone (DESYREL) 50 MG tablet Take 1 tablet (50 mg total) by mouth at bedtime.  . [DISCONTINUED] simvastatin (ZOCOR) 10 MG tablet Take 10 mg by mouth daily.      Review of systems complete and found to be negative unless listed above in HPI    Physical exam Blood pressure (!) 146/84, pulse 69, height 5\' 6"  (1.676 m), weight 191 lb (86.6 kg), last menstrual period 02/09/2012, SpO2 98 %. General: NAD Neck: No JVD, no thyromegaly or thyroid nodule.  Lungs: Clear to auscultation bilaterally with normal respiratory effort. CV: Nondisplaced PMI. Regular rate and rhythm, normal S1/S2, no K4/M0, 3/6 systolic murmur loudest over right upper sternal border.  No peripheral edema.  No carotid bruit.    Abdomen: Soft, nontender, no distention.  Skin: Intact without lesions or rashes.  Neurologic: Alert and oriented x 3.  Psych: Normal affect. Extremities: No clubbing or cyanosis.  HEENT: Normal.   ECG: Most recent ECG reviewed.   Labs: Lab Results  Component Value Date/Time   K 3.2 (L) 02/08/2018 12:09 PM   BUN 8 02/08/2018 12:09 PM   CREATININE 0.94 02/08/2018 12:09 PM   ALT 26 01/17/2018 11:53 AM   TSH 3.78 01/17/2018 11:53 AM   HGB 14.8 01/17/2018 11:53 AM     Lipids: Lab Results  Component Value Date/Time   LDLCALC 98 01/01/2017 05:52 AM   CHOL 272 (H) 01/17/2018 11:53 AM   TRIG 348 (H) 01/17/2018 11:53 AM   HDL 42 (L) 01/17/2018 11:53 AM        ASSESSMENT AND PLAN:  1.  Preoperative risk stratification: She has symptoms of exertional dyspnea which may be related to COPD given her years of tobacco use but there may being an underlying ischemic component as well.  She does have occasional retrosternal chest discomfort with exertion.  There is a family history of premature coronary disease in her mother. I will obtain an exercise Myoview stress test to evaluate for ischemic heart disease.  2.  Hypertension:  Blood pressure is mildly elevated.  No changes  to management at this time.  3.  Dyslipidemia: Lipids reviewed above in detail.  Continue Lipitor 40 mg.  4.  Cardiac murmur: By auscultation she appears to have some degree of aortic stenosis.  I will obtain an echocardiogram to evaluate cardiac structure and function.  5.  Tobacco abuse: She is trying to quit and has cut back significantly.   Disposition: Follow up in 1 month  Signed: Kate Sable, M.D., F.A.C.C.  02/14/2018, 9:25 AM

## 2018-02-14 NOTE — Patient Instructions (Signed)
Medication Instructions:  Continue all current medications.  Labwork: none  Testing/Procedures:  Your physician has requested that you have an echocardiogram. Echocardiography is a painless test that uses sound waves to create images of your heart. It provides your doctor with information about the size and shape of your heart and how well your heart's chambers and valves are working. This procedure takes approximately one hour. There are no restrictions for this procedure.  Your physician has requested that you have a lexiscan myoview. For further information please visit [email or web address]. Please follow instruction sheet, as given.  Office will contact with results via phone or letter.    Follow-Up: 3-4 weeks   Any Other Special Instructions Will Be Listed Below (If Applicable).  If you need a refill on your cardiac medications before your next appointment, please call your pharmacy.  

## 2018-02-14 NOTE — Telephone Encounter (Signed)
Pre-cert Verification for the following procedure   Lexiscan Myoview scheduled for 02-21-18 at [facility]

## 2018-02-15 ENCOUNTER — Other Ambulatory Visit: Payer: Self-pay

## 2018-02-15 ENCOUNTER — Ambulatory Visit (INDEPENDENT_AMBULATORY_CARE_PROVIDER_SITE_OTHER): Payer: Medicare Other

## 2018-02-15 DIAGNOSIS — R011 Cardiac murmur, unspecified: Secondary | ICD-10-CM

## 2018-02-16 ENCOUNTER — Telehealth: Payer: Self-pay | Admitting: *Deleted

## 2018-02-16 NOTE — Telephone Encounter (Signed)
Notes recorded by Laurine Blazer, LPN on 5/99/2341 at 4:43 PM EST Patient notified via detailed voice message. Copy to pmd. Follow up scheduled for 03/08/18.   ------  Notes recorded by Herminio Commons, MD on 02/15/2018 at 4:55 PM EST Normal pumping function. Mild valve leakage.

## 2018-02-19 ENCOUNTER — Telehealth: Payer: Self-pay | Admitting: Family Medicine

## 2018-02-19 NOTE — Telephone Encounter (Signed)
Please call in a refill for ondansetron (ZOFRAN) 4 MG tablet she still has nausea

## 2018-02-20 ENCOUNTER — Telehealth: Payer: Self-pay | Admitting: Family Medicine

## 2018-02-20 NOTE — Telephone Encounter (Signed)
Patient is requesting nausea medication, are you able to help with this. Pharmacy: [facility] [address] Cb# [phone]

## 2018-02-20 NOTE — Telephone Encounter (Signed)
See other note

## 2018-02-20 NOTE — Telephone Encounter (Signed)
Called Rita [name], aware of dr Thera Flake advice.

## 2018-02-20 NOTE — Telephone Encounter (Signed)
I have called and spoken to Rita [name]. She was given a rx on 2 14 19  for # 20 zofran - she states she has taken them all and is out, and would like more?

## 2018-02-20 NOTE — Telephone Encounter (Signed)
No.  I will not refill.  She was told to take it sparingly - per note from Dr Mannie Stabile.  Needs to go back to her gastroenterologist and follow his instructions.

## 2018-02-21 ENCOUNTER — Encounter (HOSPITAL_COMMUNITY): Payer: Self-pay

## 2018-02-21 ENCOUNTER — Other Ambulatory Visit: Payer: Self-pay

## 2018-02-21 ENCOUNTER — Emergency Department (HOSPITAL_COMMUNITY): Payer: Medicare Other

## 2018-02-21 ENCOUNTER — Encounter (HOSPITAL_COMMUNITY): Payer: Self-pay | Admitting: Emergency Medicine

## 2018-02-21 ENCOUNTER — Encounter (HOSPITAL_COMMUNITY)
Admission: RE | Admit: 2018-02-21 | Discharge: 2018-02-21 | Disposition: A | Discharge status: Home / Self Care | Payer: Medicare Other | Source: Ambulatory Visit | Attending: Cardiovascular Disease | Admitting: Cardiovascular Disease

## 2018-02-21 ENCOUNTER — Inpatient Hospital Stay (HOSPITAL_COMMUNITY)
Admission: EM | Admit: 2018-02-21 | Discharge: 2018-02-26 | DRG: 287 | Disposition: A | Discharge status: Home / Self Care | Payer: Medicare Other | Attending: Internal Medicine | Admitting: Internal Medicine

## 2018-02-21 ENCOUNTER — Ambulatory Visit (HOSPITAL_BASED_OUTPATIENT_CLINIC_OR_DEPARTMENT_OTHER)
Admission: RE | Admit: 2018-02-21 | Discharge: 2018-02-21 | Disposition: A | Discharge status: Home / Self Care | Payer: Medicare Other | Source: Ambulatory Visit | Attending: Cardiovascular Disease | Admitting: Cardiovascular Disease

## 2018-02-21 DIAGNOSIS — R0609 Other forms of dyspnea: Secondary | ICD-10-CM | POA: Diagnosis not present

## 2018-02-21 DIAGNOSIS — R0789 Other chest pain: Secondary | ICD-10-CM | POA: Diagnosis not present

## 2018-02-21 DIAGNOSIS — I10 Essential (primary) hypertension: Secondary | ICD-10-CM | POA: Diagnosis present

## 2018-02-21 DIAGNOSIS — R112 Nausea with vomiting, unspecified: Secondary | ICD-10-CM | POA: Diagnosis present

## 2018-02-21 DIAGNOSIS — K2100 Gastro-esophageal reflux disease with esophagitis, without bleeding: Secondary | ICD-10-CM

## 2018-02-21 DIAGNOSIS — R0602 Shortness of breath: Secondary | ICD-10-CM | POA: Diagnosis not present

## 2018-02-21 DIAGNOSIS — R9439 Abnormal result of other cardiovascular function study: Secondary | ICD-10-CM | POA: Insufficient documentation

## 2018-02-21 DIAGNOSIS — I708 Atherosclerosis of other arteries: Secondary | ICD-10-CM | POA: Diagnosis not present

## 2018-02-21 DIAGNOSIS — K859 Acute pancreatitis without necrosis or infection, unspecified: Secondary | ICD-10-CM | POA: Diagnosis not present

## 2018-02-21 DIAGNOSIS — K449 Diaphragmatic hernia without obstruction or gangrene: Secondary | ICD-10-CM | POA: Diagnosis not present

## 2018-02-21 DIAGNOSIS — Z7982 Long term (current) use of aspirin: Secondary | ICD-10-CM

## 2018-02-21 DIAGNOSIS — E876 Hypokalemia: Secondary | ICD-10-CM | POA: Diagnosis present

## 2018-02-21 DIAGNOSIS — I82B12 Acute embolism and thrombosis of left subclavian vein: Secondary | ICD-10-CM | POA: Diagnosis not present

## 2018-02-21 DIAGNOSIS — I2510 Atherosclerotic heart disease of native coronary artery without angina pectoris: Secondary | ICD-10-CM | POA: Diagnosis present

## 2018-02-21 DIAGNOSIS — I25110 Atherosclerotic heart disease of native coronary artery with unstable angina pectoris: Secondary | ICD-10-CM | POA: Diagnosis not present

## 2018-02-21 DIAGNOSIS — F17210 Nicotine dependence, cigarettes, uncomplicated: Secondary | ICD-10-CM | POA: Diagnosis present

## 2018-02-21 DIAGNOSIS — K2270 Barrett's esophagus without dysplasia: Secondary | ICD-10-CM | POA: Diagnosis present

## 2018-02-21 DIAGNOSIS — R079 Chest pain, unspecified: Secondary | ICD-10-CM | POA: Diagnosis not present

## 2018-02-21 DIAGNOSIS — Z79899 Other long term (current) drug therapy: Secondary | ICD-10-CM

## 2018-02-21 DIAGNOSIS — J449 Chronic obstructive pulmonary disease, unspecified: Secondary | ICD-10-CM | POA: Diagnosis present

## 2018-02-21 DIAGNOSIS — F329 Major depressive disorder, single episode, unspecified: Secondary | ICD-10-CM | POA: Diagnosis present

## 2018-02-21 DIAGNOSIS — I4510 Unspecified right bundle-branch block: Secondary | ICD-10-CM | POA: Diagnosis present

## 2018-02-21 DIAGNOSIS — E782 Mixed hyperlipidemia: Secondary | ICD-10-CM | POA: Diagnosis present

## 2018-02-21 DIAGNOSIS — Z8249 Family history of ischemic heart disease and other diseases of the circulatory system: Secondary | ICD-10-CM

## 2018-02-21 DIAGNOSIS — I739 Peripheral vascular disease, unspecified: Secondary | ICD-10-CM | POA: Diagnosis present

## 2018-02-21 HISTORY — DX: Low back pain: M54.5

## 2018-02-21 HISTORY — DX: Low back pain, unspecified: M54.50

## 2018-02-21 HISTORY — DX: Other chronic pancreatitis: K86.1

## 2018-02-21 HISTORY — DX: Pure hypercholesterolemia, unspecified: E78.00

## 2018-02-21 HISTORY — DX: Depression, unspecified: F32.A

## 2018-02-21 HISTORY — DX: Other chronic pain: G89.29

## 2018-02-21 HISTORY — DX: Chronic obstructive pulmonary disease, unspecified: J44.9

## 2018-02-21 HISTORY — DX: Essential (primary) hypertension: I10

## 2018-02-21 HISTORY — DX: Pneumonia, unspecified organism: J18.9

## 2018-02-21 HISTORY — DX: Major depressive disorder, single episode, unspecified: F32.9

## 2018-02-21 LAB — CBC WITH DIFFERENTIAL/PLATELET
Basophils Absolute: 0 10*3/uL (ref 0.0–0.1)
Basophils Relative: 0 %
EOS ABS: 0 10*3/uL (ref 0.0–0.7)
EOS PCT: 0 %
HCT: 43 % (ref 36.0–46.0)
Hemoglobin: 14.7 g/dL (ref 12.0–15.0)
Lymphocytes Relative: 12 %
Lymphs Abs: 1.5 10*3/uL (ref 0.7–4.0)
MCH: 30 pg (ref 26.0–34.0)
MCHC: 34.2 g/dL (ref 30.0–36.0)
MCV: 87.8 fL (ref 78.0–100.0)
Monocytes Absolute: 0.3 10*3/uL (ref 0.1–1.0)
Monocytes Relative: 3 %
Neutro Abs: 10.8 10*3/uL — ABNORMAL HIGH (ref 1.7–7.7)
Neutrophils Relative %: 85 %
PLATELETS: 359 10*3/uL (ref 150–400)
RBC: 4.9 MIL/uL (ref 3.87–5.11)
RDW: 13.8 % (ref 11.5–15.5)
WBC: 12.6 10*3/uL — AB (ref 4.0–10.5)

## 2018-02-21 LAB — I-STAT CG4 LACTIC ACID, ED
LACTIC ACID, VENOUS: 2.19 mmol/L — AB (ref 0.5–1.9)
Lactic Acid, Venous: 1.52 mmol/L (ref 0.5–1.9)

## 2018-02-21 LAB — NM MYOCAR MULTI W/SPECT W/WALL MOTION / EF
CHL CUP NUCLEAR SDS: 5
CSEPPHR: 93 {beats}/min
LHR: 0.25
LV dias vol: 84 mL (ref 46–106)
LV sys vol: 32 mL
Rest HR: 63 {beats}/min
SRS: 4
SSS: 9
TID: 0.83

## 2018-02-21 LAB — COMPREHENSIVE METABOLIC PANEL
ALBUMIN: 4.2 g/dL (ref 3.5–5.0)
ALK PHOS: 72 U/L (ref 38–126)
ALT: 19 U/L (ref 14–54)
ANION GAP: 20 — AB (ref 5–15)
AST: 28 U/L (ref 15–41)
BILIRUBIN TOTAL: 0.8 mg/dL (ref 0.3–1.2)
BUN: 11 mg/dL (ref 6–20)
CALCIUM: 9.1 mg/dL (ref 8.9–10.3)
CO2: 15 mmol/L — ABNORMAL LOW (ref 22–32)
Chloride: 103 mmol/L (ref 101–111)
Creatinine, Ser: 0.95 mg/dL (ref 0.44–1.00)
GFR calc non Af Amer: >60 mL/min (ref >60)
GLUCOSE: 150 mg/dL — AB (ref 65–99)
Potassium: 3 mmol/L — ABNORMAL LOW (ref 3.5–5.1)
Sodium: 138 mmol/L (ref 135–145)
TOTAL PROTEIN: 7.5 g/dL (ref 6.5–8.1)

## 2018-02-21 LAB — LIPASE, BLOOD: Lipase: 33 U/L (ref 11–51)

## 2018-02-21 LAB — SALICYLATE LEVEL: Salicylate Lvl: <7 mg/dL (ref 2.8–30.0)

## 2018-02-21 LAB — ETHANOL

## 2018-02-21 LAB — CBG MONITORING, ED: Glucose-Capillary: 155 mg/dL — ABNORMAL HIGH (ref 65–99)

## 2018-02-21 LAB — TROPONIN I
Troponin I: <0.03 ng/mL (ref <0.03)
Troponin I: <0.03 ng/mL (ref <0.03)

## 2018-02-21 MED ORDER — ASPIRIN 300 MG RE SUPP
300.0000 mg | RECTAL | Status: AC
  Filled 2018-02-21: qty 1

## 2018-02-21 MED ORDER — SODIUM CHLORIDE 0.9 % IV SOLN
INTRAVENOUS | Status: DC
  Administered 2018-02-21: 23:00:00 via INTRAVENOUS

## 2018-02-21 MED ORDER — TECHNETIUM TC 99M TETROFOSMIN IV KIT
30.0000 | PACK | Freq: Once | INTRAVENOUS | Status: AC | PRN
  Administered 2018-02-21: 31 via INTRAVENOUS

## 2018-02-21 MED ORDER — PROMETHAZINE HCL 25 MG/ML IJ SOLN: 12.5000 mg | Freq: Four times a day (QID) | INTRAMUSCULAR | Status: DC | PRN

## 2018-02-21 MED ORDER — TRAZODONE HCL 50 MG PO TABS
50.0000 mg | ORAL_TABLET | Freq: Every day | ORAL | Status: DC
  Administered 2018-02-21 – 2018-02-25 (×5): 50 mg via ORAL
  Filled 2018-02-21 (×5): qty 1

## 2018-02-21 MED ORDER — LORAZEPAM 2 MG/ML IJ SOLN
1.0000 mg | Freq: Once | INTRAMUSCULAR | Status: AC
  Administered 2018-02-21: 1 mg via INTRAVENOUS
  Filled 2018-02-21: qty 1

## 2018-02-21 MED ORDER — POTASSIUM CHLORIDE CRYS ER 20 MEQ PO TBCR: 20.0000 meq | EXTENDED_RELEASE_TABLET | Freq: Every day | ORAL | Status: DC

## 2018-02-21 MED ORDER — MORPHINE SULFATE (PF) 4 MG/ML IV SOLN
8.0000 mg | Freq: Once | INTRAVENOUS | Status: AC
  Administered 2018-02-21: 4 mg via INTRAVENOUS
  Filled 2018-02-21: qty 2

## 2018-02-21 MED ORDER — ENOXAPARIN SODIUM 40 MG/0.4ML ~~LOC~~ SOLN
40.0000 mg | SUBCUTANEOUS | Status: DC
  Administered 2018-02-22: 40 mg via SUBCUTANEOUS
  Filled 2018-02-21: qty 0.4

## 2018-02-21 MED ORDER — MAGNESIUM SULFATE 2 GM/50ML IV SOLN
2.0000 g | Freq: Once | INTRAVENOUS | Status: AC
  Administered 2018-02-21: 2 g via INTRAVENOUS
  Filled 2018-02-21: qty 50

## 2018-02-21 MED ORDER — INSULIN ASPART 100 UNIT/ML ~~LOC~~ SOLN: 0.0000 [IU] | Freq: Every day | SUBCUTANEOUS | Status: DC

## 2018-02-21 MED ORDER — MORPHINE SULFATE (PF) 4 MG/ML IV SOLN
2.0000 mg | INTRAVENOUS | Status: DC | PRN
Start: 2018-02-21 — End: 2018-02-24
  Administered 2018-02-21 – 2018-02-24 (×12): 4 mg via INTRAVENOUS
  Filled 2018-02-21 (×12): qty 1

## 2018-02-21 MED ORDER — METOCLOPRAMIDE HCL 5 MG/ML IJ SOLN
10.0000 mg | Freq: Once | INTRAMUSCULAR | Status: AC
  Administered 2018-02-21: 10 mg via INTRAVENOUS
  Filled 2018-02-21: qty 2

## 2018-02-21 MED ORDER — POTASSIUM CHLORIDE CRYS ER 20 MEQ PO TBCR
40.0000 meq | EXTENDED_RELEASE_TABLET | Freq: Once | ORAL | Status: DC
  Filled 2018-02-21: qty 2

## 2018-02-21 MED ORDER — SODIUM CHLORIDE 0.9 % IV BOLUS (SEPSIS)
1000.0000 mL | Freq: Once | INTRAVENOUS | Status: AC
  Administered 2018-02-21: 1000 mL via INTRAVENOUS

## 2018-02-21 MED ORDER — IOPAMIDOL (ISOVUE-370) INJECTION 76%
INTRAVENOUS | Status: AC
  Administered 2018-02-21: 100 mL
  Filled 2018-02-21: qty 100

## 2018-02-21 MED ORDER — SODIUM CHLORIDE 0.9% FLUSH
INTRAVENOUS | Status: AC
  Administered 2018-02-21: 10 mL via INTRAVENOUS
  Filled 2018-02-21: qty 10

## 2018-02-21 MED ORDER — NITROGLYCERIN 0.4 MG SL SUBL
0.4000 mg | SUBLINGUAL_TABLET | SUBLINGUAL | Status: DC | PRN
  Administered 2018-02-23: 0.4 mg via SUBLINGUAL
  Filled 2018-02-21: qty 1

## 2018-02-21 MED ORDER — ONDANSETRON HCL 4 MG/2ML IJ SOLN
4.0000 mg | Freq: Once | INTRAMUSCULAR | Status: AC
  Administered 2018-02-21: 4 mg via INTRAVENOUS
  Filled 2018-02-21: qty 2

## 2018-02-21 MED ORDER — REGADENOSON 0.4 MG/5ML IV SOLN
INTRAVENOUS | Status: AC
  Administered 2018-02-21: 0.4 mg via INTRAVENOUS
  Filled 2018-02-21: qty 5

## 2018-02-21 MED ORDER — PROMETHAZINE HCL 25 MG/ML IJ SOLN
25.0000 mg | Freq: Once | INTRAMUSCULAR | Status: AC
  Administered 2018-02-21: 25 mg via INTRAMUSCULAR
  Filled 2018-02-21: qty 1

## 2018-02-21 MED ORDER — ACETAMINOPHEN 325 MG PO TABS
650.0000 mg | ORAL_TABLET | ORAL | Status: DC | PRN
Start: 2018-02-21 — End: 2018-02-24
  Administered 2018-02-22: 650 mg via ORAL
  Filled 2018-02-21: qty 2

## 2018-02-21 MED ORDER — HYDRALAZINE HCL 20 MG/ML IJ SOLN: 10.0000 mg | INTRAMUSCULAR | Status: DC | PRN

## 2018-02-21 MED ORDER — FAMOTIDINE IN NACL 20-0.9 MG/50ML-% IV SOLN
20.0000 mg | Freq: Two times a day (BID) | INTRAVENOUS | Status: DC
  Administered 2018-02-22 – 2018-02-24 (×6): 20 mg via INTRAVENOUS
  Filled 2018-02-21 (×8): qty 50

## 2018-02-21 MED ORDER — ESCITALOPRAM OXALATE 10 MG PO TABS
10.0000 mg | ORAL_TABLET | Freq: Every day | ORAL | Status: DC
  Administered 2018-02-22 – 2018-02-26 (×5): 10 mg via ORAL
  Filled 2018-02-21 (×5): qty 1

## 2018-02-21 MED ORDER — ONDANSETRON HCL 4 MG/2ML IJ SOLN
4.0000 mg | Freq: Four times a day (QID) | INTRAMUSCULAR | Status: DC | PRN
  Administered 2018-02-23: 4 mg via INTRAVENOUS
  Filled 2018-02-21: qty 2

## 2018-02-21 MED ORDER — INSULIN ASPART 100 UNIT/ML ~~LOC~~ SOLN
0.0000 [IU] | Freq: Three times a day (TID) | SUBCUTANEOUS | Status: DC
  Administered 2018-02-23: 1 [IU] via SUBCUTANEOUS

## 2018-02-21 MED ORDER — ATORVASTATIN CALCIUM 40 MG PO TABS
40.0000 mg | ORAL_TABLET | Freq: Every day | ORAL | Status: DC
  Administered 2018-02-22 – 2018-02-26 (×5): 40 mg via ORAL
  Filled 2018-02-21 (×6): qty 1

## 2018-02-21 MED ORDER — AMLODIPINE BESYLATE 5 MG PO TABS
5.0000 mg | ORAL_TABLET | Freq: Every day | ORAL | Status: DC
  Administered 2018-02-22 – 2018-02-26 (×5): 5 mg via ORAL
  Filled 2018-02-21 (×5): qty 1

## 2018-02-21 MED ORDER — PENICILLIN V POTASSIUM 250 MG PO TABS
500.0000 mg | ORAL_TABLET | Freq: Two times a day (BID) | ORAL | Status: DC
  Administered 2018-02-22 – 2018-02-24 (×6): 500 mg via ORAL
  Filled 2018-02-21 (×7): qty 2

## 2018-02-21 MED ORDER — ASPIRIN 81 MG PO CHEW
324.0000 mg | CHEWABLE_TABLET | ORAL | Status: AC
  Administered 2018-02-21: 324 mg via ORAL
  Filled 2018-02-21: qty 4

## 2018-02-21 MED ORDER — METOCLOPRAMIDE HCL 5 MG/ML IJ SOLN
10.0000 mg | Freq: Three times a day (TID) | INTRAMUSCULAR | Status: DC
  Administered 2018-02-21 – 2018-02-26 (×14): 10 mg via INTRAVENOUS
  Filled 2018-02-21 (×14): qty 2

## 2018-02-21 MED ORDER — POTASSIUM CHLORIDE 10 MEQ/100ML IV SOLN
10.0000 meq | INTRAVENOUS | Status: AC
  Administered 2018-02-21 – 2018-02-22 (×3): 10 meq via INTRAVENOUS
  Filled 2018-02-21 (×4): qty 100

## 2018-02-21 MED ORDER — TECHNETIUM TC 99M TETROFOSMIN IV KIT
10.0000 | PACK | Freq: Once | INTRAVENOUS | Status: AC | PRN
  Administered 2018-02-21: 11 via INTRAVENOUS

## 2018-02-21 MED ORDER — METOPROLOL TARTRATE 5 MG/5ML IV SOLN
5.0000 mg | Freq: Once | INTRAVENOUS | Status: AC
  Administered 2018-02-21: 5 mg via INTRAVENOUS
  Filled 2018-02-21: qty 5

## 2018-02-21 MED ORDER — PANTOPRAZOLE SODIUM 40 MG PO TBEC
40.0000 mg | DELAYED_RELEASE_TABLET | Freq: Every day | ORAL | Status: DC
  Administered 2018-02-22 – 2018-02-26 (×5): 40 mg via ORAL
  Filled 2018-02-21 (×5): qty 1

## 2018-02-21 MED ORDER — ASPIRIN EC 81 MG PO TBEC
81.0000 mg | DELAYED_RELEASE_TABLET | Freq: Every day | ORAL | Status: DC
  Administered 2018-02-22 – 2018-02-25 (×4): 81 mg via ORAL
  Filled 2018-02-21 (×5): qty 1

## 2018-02-21 NOTE — ED Notes (Signed)
Patient was in tears, stating that she was in extreme pain and still felt nauseas

## 2018-02-21 NOTE — ED Notes (Signed)
Pt unable to tolerate potassium at this time, pt still feels nauseated, pt does not want IV potassium at this time either

## 2018-02-21 NOTE — ED Notes (Signed)
Patient continues to cry and ask for pain medication.

## 2018-02-21 NOTE — ED Triage Notes (Signed)
Per EMS pt had stress test 100% blockage L carodid, wanted to have surgery but pt  home started having chest pain and, 324 mg aspirin, 2 nitro, 4 mg zofran, R bundle branch block

## 2018-02-21 NOTE — H&P (Signed)
History and Physical    Rita [name] EPP:[id number] DOB: [birth year] DOA: 02/21/2018  PCP: Rita Macadam, MD   Patient coming from: Home  Chief Complaint: chest pain, N/V   HPI: Rita [name] is a 52 y.o. female with medical history significant for GERD with Barrett esophagus and esophagitis, hypertension, and left subclavian stenosis followed by vascular surgery, now presenting to the chest pain, nausea, and vomiting.  Patient reports that she has chronic recurrent episodes of nausea with vomiting that she is usually able to control with antiemetics at home.  Today, she underwent a nuclear medicine stress test as part of preoperative cardiac evaluation (preparing for left carotid to subclavian bypass), and developed chest pain with nausea during the test.  She reports vomiting once during the stress test, but then went on to have many episodes of nonbloody vomiting afterwards.  She has been unable to control her symptoms with home medications.  She also reports a chest pain, localized to an area just left of center, constant, waxing and waning in severity and associated with nausea and mild dyspnea.  She denies abdominal pain or diarrhea.  ED Course: Upon arrival to the ED, patient is found to be afebrile, saturating well on room air, with vitals otherwise normal.  EKG features a sinus rhythm with right bundle branch block and no significant change from prior.  Chest x-ray notable for borderline cardiomegaly and pulmonary vascular congestion.  Chemistry panel features a potassium of 3.0, bicarbonate of 15, and anion gap of 20.  CBC is notable for leukocytosis to 12,600.  Troponin is undetectable.  Acid is reassuring at 1.52.  CTA chest/abdomen/pelvis is negative for acute vascular finding, but notable for the known occlusion of the proximal left subclavian.  Patient was treated with 2 L of normal saline, 30 mEq of potassium, morphine, Zofran, Phenergan, Reglan, and Ativan in the ED.  She remains  hemodynamically stable, but continues to have intractable nausea with nonbloody vomiting as well as persistent chest pain, and will be observed in the stepdown unit for ongoing evaluation and management of this.  Review of Systems:  All other systems reviewed and apart from HPI, are negative.  Past Medical History:  Diagnosis Date  . Arthritis   . Barrett's esophagus with esophagitis 03/26/2013  . Chronic abdominal pain   . Chronic back pain   . GERD (gastroesophageal reflux disease)   . Heart murmur   . Hiatal hernia   . Pancreatitis   . Peptic ulcer   . Tobacco use 03/26/2013    Past Surgical History:  Procedure Laterality Date  . AORTIC ARCH ANGIOGRAPHY N/A 01/19/2018   Procedure: AORTIC ARCH ANGIOGRAPHY;  Surgeon: Rita Dutch, MD;  Location: [facility];  Service: Cardiovascular;  Laterality: N/A;  . BIOPSY  04/17/2017   Procedure: BIOPSY;  Surgeon: Rita Dolin, MD;  Location: [facility];  Service: Endoscopy;;  esophageal  . CESAREAN SECTION    . CHOLECYSTECTOMY    . COLONOSCOPY WITH PROPOFOL N/A 04/17/2017   Procedure: COLONOSCOPY WITH PROPOFOL;  Surgeon: Rita Dolin, MD;  Location: [facility];  Service: Endoscopy;  Laterality: N/A;  100  . ESOPHAGOGASTRODUODENOSCOPY  Oct 2011   Dr. Laural Golden: ulcer in distal esophagus, soft stricture at GE junction s/p balloon dilation PATH: BARRETT'S  . ESOPHAGOGASTRODUODENOSCOPY (EGD) WITH ESOPHAGEAL DILATION N/A 03/27/2013   Procedure: ESOPHAGOGASTRODUODENOSCOPY (EGD) WITH ESOPHAGEAL DILATION;  Surgeon: Rita Dolin, MD;  [facility]: [facility];  Service: Endoscopy;  Laterality:  N/A;  possible dilation  . ESOPHAGOGASTRODUODENOSCOPY (EGD) WITH PROPOFOL N/A 01/02/2017   Procedure: ESOPHAGOGASTRODUODENOSCOPY (EGD) WITH PROPOFOL;  Surgeon: Rita Dolin, MD;  [facility]: [facility];  Service: Endoscopy;  Laterality: N/A;  with possible esophageal dilation  . ESOPHAGOGASTRODUODENOSCOPY (EGD) WITH PROPOFOL N/A 04/17/2017    Procedure: ESOPHAGOGASTRODUODENOSCOPY (EGD) WITH PROPOFOL;  Surgeon: Rita Dolin, MD;  [facility]: [facility];  Service: Endoscopy;  Laterality: N/A;  . EUS N/A 05/09/2013   Procedure: UPPER ENDOSCOPIC ULTRASOUND (EUS) LINEAR;  Surgeon: Rita Banister, MD;  Location: [facility];  Service: Endoscopy;  Laterality: N/A;  Rita [name] DILATION N/A 04/17/2017   Procedure: Rita [name] DILATION;  Surgeon: Rita Dolin, MD;  Location: [facility];  Service: Endoscopy;  Laterality: N/A;  . POLYPECTOMY  04/17/2017   Procedure: POLYPECTOMY;  Surgeon: Rita Dolin, MD;  Location: [facility];  Service: Endoscopy;;  . TUBAL LIGATION    . UPPER EXTREMITY ANGIOGRAPHY N/A 01/19/2018   Procedure: UPPER EXTREMITY ANGIOGRAPHY;  Surgeon: Rita Dutch, MD;  Location: [facility];  Service: Cardiovascular;  Laterality: N/A;     reports that she has been smoking cigarettes.  She started smoking about 35 years ago. She has a 30.00 pack-year smoking history. she has never used smokeless tobacco. She reports that she does not drink alcohol or use drugs.  No Known Allergies  Family History  Problem Relation Age of Onset  . Asthma Mother   . Heart failure Mother   . Cancer Mother        pancreatic  . Diabetes Mother   . Hypertension Mother   . Stroke Mother   . Pancreatic cancer Mother        deceased  . Heart failure Father   . Diabetes Father   . Colon cancer Neg Hx      Prior to Admission medications   Medication Sig Start Date End Date Taking? Authorizing Provider  acetaminophen-codeine (TYLENOL #3) 300-30 MG tablet Take 1 tablet by mouth every 6 (six) hours as needed for pain. Every six to eight hours as needed for pain 02/14/18  Yes [provider]  amLODipine (NORVASC) 5 MG tablet Take 1 tablet (5 mg total) by mouth daily. 01/17/18  Yes Rita Macadam, MD  aspirin EC 81 MG tablet Take 1 tablet (81 mg total) by mouth daily. 01/17/18  Yes Hagler, Apolonio Schneiders, MD  atorvastatin  (LIPITOR) 40 MG tablet Take 1 tablet (40 mg total) by mouth daily. 02/08/18  Yes Hagler, Apolonio Schneiders, MD  dexlansoprazole (DEXILANT) 60 MG capsule Take 60 mg by mouth daily.   Yes [provider]  escitalopram (LEXAPRO) 10 MG tablet Take 1 tablet (10 mg total) by mouth daily. 01/17/18  Yes Rita Macadam, MD  ibuprofen (ADVIL,MOTRIN) 200 MG tablet Take 400 mg by mouth every 6 (six) hours as needed for headache or moderate pain.   Yes [provider]  metoCLOPramide (REGLAN) 5 MG tablet Take 1 tablet (5 mg total) by mouth 3 (three) times daily before meals. 02/08/18  Yes Rita Macadam, MD  ondansetron (ZOFRAN) 4 MG tablet Take 1 tablet (4 mg total) by mouth every 8 (eight) hours as needed for nausea or vomiting. 02/08/18  Yes Rita Macadam, MD  penicillin v potassium (VEETID) 500 MG tablet Take 500 mg by mouth 2 (two) times daily. 02/14/18  Yes [provider]  potassium chloride SA (K-DUR,KLOR-CON) 20 MEQ tablet Take 20 mEq by mouth daily.   Yes [provider]  traZODone (DESYREL) 50 MG tablet Take 1 tablet (50 mg total) by mouth at bedtime. 02/08/18  Yes Rita Macadam, MD    Physical Exam: Vitals:   02/21/18 1623 02/21/18 1700 02/21/18 1830 02/21/18 2120  BP:  (!) 170/90 127/74 (!) 182/94  Pulse:   71 79  Resp:   (!) 22 19  Temp: 98 F (36.7 C)     TempSrc: Oral     SpO2:   99% 97%  Weight:      Height:          Constitutional: NAD, calm, in apparent discomfort, clutching emesis bag Eyes: PERTLA, lids and conjunctivae normal ENMT: Mucous membranes are moist. Posterior pharynx clear of any exudate or lesions.   Neck: normal, supple, no masses, no thyromegaly Respiratory: clear to auscultation bilaterally, no wheezing, no crackles. Normal respiratory effort.   Cardiovascular: S1 & S2 heard, regular rate and rhythm. No significant JVD. Abdomen: No distension, no tenderness, no masses palpated. Bowel sounds active.  Musculoskeletal: no clubbing / cyanosis.  No joint deformity upper and lower extremities.    Skin: no significant rashes, lesions, ulcers. Warm, dry, well-perfused. Neurologic: CN 2-12 grossly intact. Sensation intact. Strength 5/5 in all 4 limbs.  Psychiatric: Alert and oriented x 3. Calm, cooperative.     Labs on Admission: I have personally reviewed following labs and imaging studies  CBC: Recent Labs  Lab 02/21/18 1545  WBC 12.6*  NEUTROABS 10.8*  HGB 14.7  HCT 43.0  MCV 87.8  PLT 485   Basic Metabolic Panel: Recent Labs  Lab 02/21/18 1545  NA 138  K 3.0*  CL 103  CO2 15*  GLUCOSE 150*  BUN 11  CREATININE 0.95  CALCIUM 9.1   GFR: Estimated Creatinine Clearance: 76.8 mL/min (by C-G formula based on SCr of 0.95 mg/dL). Liver Function Tests: Recent Labs  Lab 02/21/18 1545  AST 28  ALT 19  ALKPHOS 72  BILITOT 0.8  PROT 7.5  ALBUMIN 4.2   Recent Labs  Lab 02/21/18 1545  LIPASE 33   No results for input(s): AMMONIA in the last 168 hours. Coagulation Profile: No results for input(s): INR, PROTIME in the last 168 hours. Cardiac Enzymes: Recent Labs  Lab 02/21/18 1545  TROPONINI <0.03   BNP (last 3 results) No results for input(s): PROBNP in the last 8760 hours. HbA1C: No results for input(s): HGBA1C in the last 72 hours. CBG: No results for input(s): GLUCAP in the last 168 hours. Lipid Profile: No results for input(s): CHOL, HDL, LDLCALC, TRIG, CHOLHDL, LDLDIRECT in the last 72 hours. Thyroid Function Tests: No results for input(s): TSH, T4TOTAL, FREET4, T3FREE, THYROIDAB in the last 72 hours. Anemia Panel: No results for input(s): VITAMINB12, FOLATE, FERRITIN, TIBC, IRON, RETICCTPCT in the last 72 hours. Urine analysis:    Component Value Date/Time   COLORURINE COLORLESS (A) 07/19/2017 1248   APPEARANCEUR CLEAR 07/19/2017 1248   LABSPEC 1.017 07/19/2017 1248   PHURINE 9.0 (H) 07/19/2017 1248   GLUCOSEU NEGATIVE 07/19/2017 1248   HGBUR SMALL (A) 07/19/2017 1248   BILIRUBINUR  NEGATIVE 07/19/2017 1248   KETONESUR NEGATIVE 07/19/2017 1248   PROTEINUR NEGATIVE 07/19/2017 1248   UROBILINOGEN 0.2 02/05/2015 2347   NITRITE NEGATIVE 07/19/2017 1248   LEUKOCYTESUR NEGATIVE 07/19/2017 1248   Sepsis Labs: @LABRCNTIP (procalcitonin:4,lacticidven:4) )No results found for this or any previous visit (from the past 240 hour(s)).   Radiological Exams on Admission: Dg Chest 2 View  Result Date: 02/21/2018 CLINICAL DATA:  Left carotid occlusion.  Chest  pain. EXAM: CHEST  2 VIEW COMPARISON:  Two-view chest x-ray 12/30/2016. FINDINGS: Heart is enlarged. Mild pulmonary vascular congestion is present without frank edema. There are no effusions. The visualized soft tissues and bony thorax are unremarkable. IMPRESSION: Borderline cardiomegaly and mild pulmonary vascular congestion. Electronically Signed   By: San Morelle M.D.   On: 02/21/2018 16:34   Nm Myocar Multi W/spect W/wall Motion / Ef  Result Date: 02/21/2018  There was no ST segment deviation noted during stress.  Defect 1: There is a medium defect of moderate severity present in the mid anterior, mid anteroseptal, apical anterior and apical septal location.  Findings consistent with ischemia.  This is an intermediate risk study.  Nuclear stress EF: 62%.    Ct Angio Chest/abd/pel For Dissection W And/or Wo Contrast  Result Date: 02/21/2018 CLINICAL DATA:  Acute onset of extreme chest pain today. Current smoker. Pancreatitis. EXAM: CT ANGIOGRAPHY CHEST, ABDOMEN AND PELVIS TECHNIQUE: Multidetector CT imaging through the chest, abdomen and pelvis was performed using the standard protocol during bolus administration of intravenous contrast. Multiplanar reconstructed images and MIPs were obtained and reviewed to evaluate the vascular anatomy. CONTRAST:  177mL ISOVUE-370 IOPAMIDOL (ISOVUE-370) INJECTION 76% COMPARISON:  Chest CTA 02/06/2015.  Abdominal CT 07/19/2017. FINDINGS: CTA CHEST FINDINGS Cardiovascular: Precontrast  images through the chest demonstrate mild aortic atherosclerosis, but no displaced intimal calcifications. There are mild coronary artery calcifications as well. Following contrast, the aortic lumen opacifies normally. There is no aneurysm or dissection. However, there is evidence of high-grade stenosis or short segment occlusion of the proximal left subclavian artery (image 18/7 and 103/11). This is proximal to the left vertebral artery which is patent. The pulmonary arteries are patent. The heart size is normal. There is no pericardial effusion. Mediastinum/Nodes: There are no enlarged mediastinal, hilar or axillary lymph nodes. There is a small hiatal hernia with mild distal esophageal wall thickening, similar to previous studies. Lungs/Pleura: There is no pleural effusion. Mild dependent atelectasis in both lungs. There is a stable small right lower lobe calcified granuloma. No suspicious pulmonary nodules. Musculoskeletal/Chest wall: There is no chest wall mass or suspicious osseous finding. Review of the MIP images confirms the above findings. CTA ABDOMEN AND PELVIS FINDINGS VASCULAR Aorta: Atherosclerosis without aneurysm or dissection. Celiac: Widely patent. SMA: Widely patent. Renals: Duplicated on the right. Mild atherosclerosis. No significant stenosis. IMA: Patent. Inflow: Bilateral common and external iliac atherosclerosis. No significant stenosis. Veins: Limited opacification.  No significant findings. Review of the MIP images confirms the above findings. NON-VASCULAR Hepatobiliary: Hepatic steatosis again noted. There is a stable small cyst in segment 5. No significant biliary dilatation status post cholecystectomy. Pancreas: Unremarkable. No pancreatic ductal dilatation or surrounding inflammatory changes. Spleen: Normal in size without focal abnormality. Adrenals/Urinary Tract: Both adrenal glands appear normal. No significant kidney findings. The previously demonstrated small renal cysts are not  well visualized. No evidence of urinary tract calculus or hydronephrosis. The bladder appears normal. Stomach/Bowel: No evidence of bowel wall thickening, distention or surrounding inflammatory change. The appendix appears normal. Lymphatic: There are no enlarged abdominal or pelvic lymph nodes. Reproductive: The uterus and ovaries appear normal. No adnexal mass. Other: No evidence of abdominal wall mass or hernia. No ascites. Musculoskeletal: No acute or significant osseous findings. Stable bone islands in the pelvis. Review of the MIP images confirms the above findings. IMPRESSION: 1. No definite acute vascular findings in the chest, abdomen or pelvis. No evidence of aneurysm or dissection. 2. High-grade stenosis or focal short segment  occlusion of the proximal left subclavian artery, proximal to the vertebral artery. This predisposes to subclavian steal syndrome. 3.  Aortic Atherosclerosis (ICD10-I70.0). 4. Hiatal hernia and distal esophageal wall thickening, similar to previous studies. 5. Hepatic steatosis. Electronically Signed   By: Richardean Sale M.D.   On: 02/21/2018 20:43    EKG: Independently reviewed. Sinus rhythm, RBBB, no significant change from prior.   Assessment/Plan   1. Chest pain  - Reports chest pain that developed shortly after a nuclear medicine stress-test earlier on day of presentation  - Stress test was an intermediate-risk study with a defect of moderate severity consistent with ischemia, EF 62%  - CTA C/A/P negative for dissection or acute vascular process, notable for the known left subclavian stenosis/occlusion  - EKG does not appear changed from prior, initial troponin undetectable  - Treat with ASA, may need to be rectal d/t #2, continue statin as tolerated, start beta-blocker in light of elevated BP, continue cardiac monitoring, obtain serial troponin measurements, repeat EKG    2. Intractable nausea and vomiting  - Pt has chronic nausea with frequent vomiting,  usually able to control with antiemetics at home, but more severe today and has persisted throughout the day  - Treated in ED with Zofran, phenergan, and Ativan, but continues to vomit, unable to tolerate oral medications including potassium - No abdominal pain, abdominal exam benign  - Similar to chronic N/V, just more persistent, and possibly secondary to hiatal hernia with GERD and esophagitis  - Schedule Reglan, continue prn Zofran and phenergan, continue IVF hydration, monitor and replete lytes    3. GERD  - Hx of hiatal hernia and Barrett's with esophagitis  - Takes daily PPI, will continue, IV Pepcid added given intractable N/V    4. Hypokalemia  - Serum potassium is 3.0 on admission in the setting of GI-losses   - Treated in ED with 30 mEq IV potassium, also given 2 g IV magnesium  - Continue cardiac monitoring, manage N/V as above, repeat chemistries in am    5. Left subclavian artery occlusion  - Follows with vascular surgery, planning for left carotid-to-subclavian bypass    DVT prophylaxis: Lovenox  Code Status: Full  Family Communication: significant other updated at bedside Disposition Plan: Observe in SDU Consults called: None Admission status: Observation     Vianne Bulls, MD [facility] Pager [phone]  If 7PM-7AM, please contact night-coverage [email or web address] Password [facility]  02/21/2018, 10:02 PM

## 2018-02-21 NOTE — ED Provider Notes (Addendum)
[facility] EMERGENCY DEPARTMENT Provider Note   CSN: [id number] Arrival date & time: 02/21/18  1532     History   Chief Complaint Chief Complaint  Patient presents with  . Chest Pain    HPI Rita [name] is a 52 y.o. female.  HPI  52 year old female presents with chest tightness.  Started this morning while doing a stress test.  She states this is the injected medicine she started feeling tightness that was mild at first and then progressively worsened.  Now it is severe and has been for the last 3 hours.  Since then she has been having vomiting and subsequent upper abdominal pain.  Had a couple loose stools as well.  She is been having shortness of breath and states she broke out into a sweat.  She is having stress test to help get preop clearance for a subclavian occlusion.  EMS gave IM Zofran with no relief and she is still vomiting. She took 4 baby aspirin prior to arrival  Past Medical History:  Diagnosis Date  . Arthritis   . Barrett's esophagus with esophagitis 03/26/2013  . Chronic abdominal pain   . Chronic back pain   . GERD (gastroesophageal reflux disease)   . Heart murmur   . Hiatal hernia   . Pancreatitis   . Peptic ulcer   . Tobacco use 03/26/2013    Patient Active Problem List   Diagnosis Date Noted  . CAD (coronary artery disease) 02/21/2018  . Chest pain 02/21/2018  . Intractable nausea and vomiting 02/21/2018  . Left subclavian artery occlusion 02/21/2018  . Rectal bleeding 03/21/2017  . Hiatal hernia with gastroesophageal reflux disease and esophagitis   . Biliary pain   . Pain of upper abdomen   . Elevated liver enzymes   . Chronic abdominal pain   . Pancreatitis 02/06/2015  . Acute pancreatitis   . Elevated LFTs   . Pancreatitis, acute 05/09/2013  . Hypokalemia 03/27/2013  . Anemia 03/27/2013  . Esophageal dysphagia 03/26/2013  . Chronic back pain 03/26/2013  . GERD (gastroesophageal reflux disease) 03/26/2013  . Tobacco  use 03/26/2013  . Barrett's esophagus with esophagitis 03/26/2013  . Elevated lipase 08/20/2011    Past Surgical History:  Procedure Laterality Date  . AORTIC ARCH ANGIOGRAPHY N/A 01/19/2018   Procedure: AORTIC ARCH ANGIOGRAPHY;  Surgeon: Elam Dutch, MD;  Location: [facility];  Service: Cardiovascular;  Laterality: N/A;  . BIOPSY  04/17/2017   Procedure: BIOPSY;  Surgeon: Daneil Dolin, MD;  Location: [facility];  Service: Endoscopy;;  esophageal  . CESAREAN SECTION    . CHOLECYSTECTOMY    . COLONOSCOPY WITH PROPOFOL N/A 04/17/2017   Procedure: COLONOSCOPY WITH PROPOFOL;  Surgeon: Daneil Dolin, MD;  Location: [facility];  Service: Endoscopy;  Laterality: N/A;  100  . ESOPHAGOGASTRODUODENOSCOPY  Oct 2011   Dr. Laural Golden: ulcer in distal esophagus, soft stricture at GE junction s/p balloon dilation PATH: BARRETT'S  . ESOPHAGOGASTRODUODENOSCOPY (EGD) WITH ESOPHAGEAL DILATION N/A 03/27/2013   Procedure: ESOPHAGOGASTRODUODENOSCOPY (EGD) WITH ESOPHAGEAL DILATION;  Surgeon: Daneil Dolin, MD;  [facility]: [facility];  Service: Endoscopy;  Laterality: N/A;  possible dilation  . ESOPHAGOGASTRODUODENOSCOPY (EGD) WITH PROPOFOL N/A 01/02/2017   Procedure: ESOPHAGOGASTRODUODENOSCOPY (EGD) WITH PROPOFOL;  Surgeon: Daneil Dolin, MD;  [facility]: [facility];  Service: Endoscopy;  Laterality: N/A;  with possible esophageal dilation  . ESOPHAGOGASTRODUODENOSCOPY (EGD) WITH PROPOFOL N/A 04/17/2017   Procedure: ESOPHAGOGASTRODUODENOSCOPY (EGD) WITH PROPOFOL;  Surgeon: Daneil Dolin, MD;  [facility];  Service: Endoscopy;  Laterality: N/A;  . EUS N/A 05/09/2013   Procedure: UPPER ENDOSCOPIC ULTRASOUND (EUS) LINEAR;  Surgeon: Milus Banister, MD;  Location: [facility];  Service: Endoscopy;  Laterality: N/A;  Venia Minks DILATION N/A 04/17/2017   Procedure: Venia Minks DILATION;  Surgeon: Daneil Dolin, MD;  [facility];  Service: Endoscopy;  Laterality: N/A;  . POLYPECTOMY   04/17/2017   Procedure: POLYPECTOMY;  Surgeon: Daneil Dolin, MD;  [facility]: [facility];  Service: Endoscopy;;  . TUBAL LIGATION    . UPPER EXTREMITY ANGIOGRAPHY N/A 01/19/2018   Procedure: UPPER EXTREMITY ANGIOGRAPHY;  Surgeon: Elam Dutch, MD;  Location: [facility];  Service: Cardiovascular;  Laterality: N/A;    OB History    Gravida Para Term Preterm AB Living   4 3 3   1 3    SAB TAB Ectopic Multiple Live Births   1               Home Medications    Prior to Admission medications   Medication Sig Start Date End Date Taking? Authorizing Provider  acetaminophen-codeine (TYLENOL #3) 300-30 MG tablet Take 1 tablet by mouth every 6 (six) hours as needed for pain. Every six to eight hours as needed for pain 02/14/18  Yes [provider]  amLODipine (NORVASC) 5 MG tablet Take 1 tablet (5 mg total) by mouth daily. 01/17/18  Yes Caren Macadam, MD  aspirin EC 81 MG tablet Take 1 tablet (81 mg total) by mouth daily. 01/17/18  Yes Hagler, Apolonio Schneiders, MD  atorvastatin (LIPITOR) 40 MG tablet Take 1 tablet (40 mg total) by mouth daily. 02/08/18  Yes Hagler, Apolonio Schneiders, MD  dexlansoprazole (DEXILANT) 60 MG capsule Take 60 mg by mouth daily.   Yes [provider]  escitalopram (LEXAPRO) 10 MG tablet Take 1 tablet (10 mg total) by mouth daily. 01/17/18  Yes Caren Macadam, MD  ibuprofen (ADVIL,MOTRIN) 200 MG tablet Take 400 mg by mouth every 6 (six) hours as needed for headache or moderate pain.   Yes [provider]  metoCLOPramide (REGLAN) 5 MG tablet Take 1 tablet (5 mg total) by mouth 3 (three) times daily before meals. 02/08/18  Yes Caren Macadam, MD  ondansetron (ZOFRAN) 4 MG tablet Take 1 tablet (4 mg total) by mouth every 8 (eight) hours as needed for nausea or vomiting. 02/08/18  Yes Caren Macadam, MD  penicillin v potassium (VEETID) 500 MG tablet Take 500 mg by mouth 2 (two) times daily. 02/14/18  Yes [provider]  potassium chloride SA  (K-DUR,KLOR-CON) 20 MEQ tablet Take 20 mEq by mouth daily.   Yes [provider]  traZODone (DESYREL) 50 MG tablet Take 1 tablet (50 mg total) by mouth at bedtime. 02/08/18  Yes Caren Macadam, MD    Family History Family History  Problem Relation Age of Onset  . Asthma Mother   . Heart failure Mother   . Cancer Mother        pancreatic  . Diabetes Mother   . Hypertension Mother   . Stroke Mother   . Pancreatic cancer Mother        deceased  . Heart failure Father   . Diabetes Father   . Colon cancer Neg Hx     Social History Social History   Tobacco Use  . Smoking status: Current Every Day Smoker    Packs/day: 1.50    Years: 20.00  Pack years: 30.00    Types: Cigarettes    Start date: 09/26/1982  . Smokeless tobacco: Never Used  Substance Use Topics  . Alcohol use: No  . Drug use: No     Allergies   Patient has no known allergies.   Review of Systems Review of Systems  Constitutional: Negative for fever.  Respiratory: Positive for shortness of breath.   Cardiovascular: Positive for chest pain.  Gastrointestinal: Positive for abdominal pain, diarrhea, nausea and vomiting.  Musculoskeletal: Negative for back pain (chronic back pain but no new back pain with this).  All other systems reviewed and are negative.    Physical Exam Updated Vital Signs BP 118/85   Pulse 67   Temp 98 F (36.7 C) (Oral)   Resp 20   Ht 5\' 6"  (1.676 m)   Wt 86.6 kg (191 lb)   LMP 02/09/2012   SpO2 96%   BMI 30.83 kg/m   Physical Exam  Constitutional: She is oriented to person, place, and time. She appears well-developed and well-nourished.  HENT:  Head: Normocephalic and atraumatic.  Right Ear: External ear normal.  Left Ear: External ear normal.  Nose: Nose normal.  Eyes: Right eye exhibits no discharge. Left eye exhibits no discharge.  Cardiovascular: Normal rate, regular rhythm and normal heart sounds.  Pulmonary/Chest: Effort normal and breath sounds  normal. She exhibits tenderness.    Abdominal: Soft. There is tenderness in the epigastric area.  Neurological: She is alert and oriented to person, place, and time.  Skin: Skin is warm and dry.  Nursing note and vitals reviewed.    ED Treatments / Results  Labs (all labs ordered are listed, but only abnormal results are displayed) Labs Reviewed  COMPREHENSIVE METABOLIC PANEL - Abnormal; Notable for the following components:      Result Value   Potassium 3.0 (*)    CO2 15 (*)    Glucose, Bld 150 (*)    Anion gap 20 (*)    All other components within normal limits  CBC WITH DIFFERENTIAL/PLATELET - Abnormal; Notable for the following components:   WBC 12.6 (*)    Neutro Abs 10.8 (*)    All other components within normal limits  I-STAT CG4 LACTIC ACID, ED - Abnormal; Notable for the following components:   Lactic Acid, Venous 2.19 (*)    All other components within normal limits  CBG MONITORING, ED - Abnormal; Notable for the following components:   Glucose-Capillary 155 (*)    All other components within normal limits  URINE CULTURE  LIPASE, BLOOD  TROPONIN I  ETHANOL  SALICYLATE LEVEL  TROPONIN I  TROPONIN I  TROPONIN I  TROPONIN I  BASIC METABOLIC PANEL  CBC  MAGNESIUM  URINALYSIS, ROUTINE W REFLEX MICROSCOPIC  I-STAT CG4 LACTIC ACID, ED    EKG  EKG Interpretation  Date/Time:  Wednesday February 21 2018 15:43:41 EST Ventricular Rate:  57 PR Interval:    QRS Duration: 165 QT Interval:  566 QTC Calculation: 552 R Axis:   106 Text Interpretation:  Sinus rhythm Right bundle branch block no significant change since Jan 2019 Confirmed by Sherwood Gambler [phone]) on 02/21/2018 4:26:52 PM       Radiology Dg Chest 2 View  Result Date: 02/21/2018 CLINICAL DATA:  Left carotid occlusion.  Chest pain. EXAM: CHEST  2 VIEW COMPARISON:  Two-view chest x-ray 12/30/2016. FINDINGS: Heart is enlarged. Mild pulmonary vascular congestion is present without frank edema. There  are no effusions. The visualized soft tissues  and bony thorax are unremarkable. IMPRESSION: Borderline cardiomegaly and mild pulmonary vascular congestion. Electronically Signed   By: San Morelle M.D.   On: 02/21/2018 16:34   Ct Angio Chest/abd/pel For Dissection W And/or Wo Contrast  Result Date: 02/21/2018 CLINICAL DATA:  Acute onset of extreme chest pain today. Current smoker. Pancreatitis. EXAM: CT ANGIOGRAPHY CHEST, ABDOMEN AND PELVIS TECHNIQUE: Multidetector CT imaging through the chest, abdomen and pelvis was performed using the standard protocol during bolus administration of intravenous contrast. Multiplanar reconstructed images and MIPs were obtained and reviewed to evaluate the vascular anatomy. CONTRAST:  16mL ISOVUE-370 IOPAMIDOL (ISOVUE-370) INJECTION 76% COMPARISON:  Chest CTA 02/06/2015.  Abdominal CT 07/19/2017. FINDINGS: CTA CHEST FINDINGS Cardiovascular: Precontrast images through the chest demonstrate mild aortic atherosclerosis, but no displaced intimal calcifications. There are mild coronary artery calcifications as well. Following contrast, the aortic lumen opacifies normally. There is no aneurysm or dissection. However, there is evidence of high-grade stenosis or short segment occlusion of the proximal left subclavian artery (image 18/7 and 103/11). This is proximal to the left vertebral artery which is patent. The pulmonary arteries are patent. The heart size is normal. There is no pericardial effusion. Mediastinum/Nodes: There are no enlarged mediastinal, hilar or axillary lymph nodes. There is a small hiatal hernia with mild distal esophageal wall thickening, similar to previous studies. Lungs/Pleura: There is no pleural effusion. Mild dependent atelectasis in both lungs. There is a stable small right lower lobe calcified granuloma. No suspicious pulmonary nodules. Musculoskeletal/Chest wall: There is no chest wall mass or suspicious osseous finding. Review of the MIP images  confirms the above findings. CTA ABDOMEN AND PELVIS FINDINGS VASCULAR Aorta: Atherosclerosis without aneurysm or dissection. Celiac: Widely patent. SMA: Widely patent. Renals: Duplicated on the right. Mild atherosclerosis. No significant stenosis. IMA: Patent. Inflow: Bilateral common and external iliac atherosclerosis. No significant stenosis. Veins: Limited opacification.  No significant findings. Review of the MIP images confirms the above findings. NON-VASCULAR Hepatobiliary: Hepatic steatosis again noted. There is a stable small cyst in segment 5. No significant biliary dilatation status post cholecystectomy. Pancreas: Unremarkable. No pancreatic ductal dilatation or surrounding inflammatory changes. Spleen: Normal in size without focal abnormality. Adrenals/Urinary Tract: Both adrenal glands appear normal. No significant kidney findings. The previously demonstrated small renal cysts are not well visualized. No evidence of urinary tract calculus or hydronephrosis. The bladder appears normal. Stomach/Bowel: No evidence of bowel wall thickening, distention or surrounding inflammatory change. The appendix appears normal. Lymphatic: There are no enlarged abdominal or pelvic lymph nodes. Reproductive: The uterus and ovaries appear normal. No adnexal mass. Other: No evidence of abdominal wall mass or hernia. No ascites. Musculoskeletal: No acute or significant osseous findings. Stable bone islands in the pelvis. Review of the MIP images confirms the above findings. IMPRESSION: 1. No definite acute vascular findings in the chest, abdomen or pelvis. No evidence of aneurysm or dissection. 2. High-grade stenosis or focal short segment occlusion of the proximal left subclavian artery, proximal to the vertebral artery. This predisposes to subclavian steal syndrome. 3.  Aortic Atherosclerosis (ICD10-I70.0). 4. Hiatal hernia and distal esophageal wall thickening, similar to previous studies. 5. Hepatic steatosis.  Electronically Signed   By: Richardean Sale M.D.   On: 02/21/2018 20:43    Procedures Procedures (including critical care time)  Medications Ordered in ED Medications  potassium chloride SA (K-DUR,KLOR-CON) CR tablet 40 mEq (0 mEq Oral Hold 02/21/18 2039)  amLODipine (NORVASC) tablet 5 mg (not administered)  aspirin EC tablet 81 mg (not administered)  atorvastatin (LIPITOR) tablet 40 mg (40 mg Oral Given 02/22/18 0055)  pantoprazole (PROTONIX) EC tablet 40 mg (not administered)  escitalopram (LEXAPRO) tablet 10 mg (not administered)  penicillin v potassium (VEETID) tablet 500 mg (500 mg Oral Given 02/22/18 0055)  potassium chloride SA (K-DUR,KLOR-CON) CR tablet 20 mEq (not administered)  traZODone (DESYREL) tablet 50 mg (50 mg Oral Given 02/21/18 2251)  nitroGLYCERIN (NITROSTAT) SL tablet 0.4 mg (not administered)  acetaminophen (TYLENOL) tablet 650 mg (not administered)  ondansetron (ZOFRAN) injection 4 mg (not administered)  enoxaparin (LOVENOX) injection 40 mg (40 mg Subcutaneous Given 02/22/18 0055)  insulin aspart (novoLOG) injection 0-9 Units (not administered)  insulin aspart (novoLOG) injection 0-5 Units (0 Units Subcutaneous Not Given 02/21/18 2246)  metoCLOPramide (REGLAN) injection 10 mg (10 mg Intravenous Given 02/21/18 2234)  promethazine (PHENERGAN) injection 12.5-25 mg (not administered)  morphine 4 MG/ML injection 2-4 mg (4 mg Intravenous Given 02/21/18 2234)  hydrALAZINE (APRESOLINE) injection 10 mg (not administered)  0.9 %  sodium chloride infusion ( Intravenous New Bag/Given 02/21/18 2235)  famotidine (PEPCID) IVPB 20 mg premix (0 mg Intravenous Stopped 02/22/18 0051)  promethazine (PHENERGAN) injection 25 mg (25 mg Intramuscular Given 02/21/18 1620)  sodium chloride 0.9 % bolus 1,000 mL (0 mLs Intravenous Stopped 02/21/18 1756)  morphine 4 MG/ML injection 8 mg (4 mg Intravenous Given 02/21/18 1749)  metoCLOPramide (REGLAN) injection 10 mg (10 mg Intravenous Given 02/21/18 1747)   sodium chloride 0.9 % bolus 1,000 mL (0 mLs Intravenous Stopped 02/21/18 2042)  ondansetron (ZOFRAN) injection 4 mg (4 mg Intravenous Given 02/21/18 2039)  iopamidol (ISOVUE-370) 76 % injection (100 mLs  Contrast Given 02/21/18 2011)  potassium chloride 10 mEq in 100 mL IVPB (0 mEq Intravenous Stopped 02/22/18 0112)  LORazepam (ATIVAN) injection 1 mg (1 mg Intravenous Given 02/21/18 2115)  aspirin chewable tablet 324 mg (324 mg Oral Given 02/21/18 2232)    Or  aspirin suppository 300 mg ( Rectal See Alternative 02/21/18 2232)  magnesium sulfate IVPB 2 g 50 mL (0 g Intravenous Stopped 02/22/18 0002)  metoprolol tartrate (LOPRESSOR) injection 5 mg (5 mg Intravenous Given 02/21/18 2234)     Initial Impression / Assessment and Plan / ED Course  I have reviewed the triage vital signs and the nursing notes.  Pertinent labs & imaging results that were available during my care of the patient were reviewed by me and considered in my medical decision making (see chart for details).     Patient has atypical chest pain and multiple episodes of emesis. Unable to get vomiting under control despite multiple meds. Benign exam otherwise, but given CP and HTN, dissection study obtained and negative. ECG stable compared to baseline. Had stress today but results not back during workup. Will need further workup and vomiting control, especially given hypokalemia. Admit to hospitalist.   Final Clinical Impressions(s) / ED Diagnoses   Final diagnoses:  Intractable vomiting with nausea, unspecified vomiting type  Hypokalemia    ED Discharge Orders    None       Sherwood Gambler, MD 02/22/18 Areta Haber    Sherwood Gambler, MD 02/22/18 Corlis Leak    Sherwood Gambler, MD 02/22/18 [phone]

## 2018-02-21 NOTE — ED Notes (Signed)
Patient transported to X-ray 

## 2018-02-22 ENCOUNTER — Encounter (HOSPITAL_COMMUNITY): Payer: Self-pay

## 2018-02-22 DIAGNOSIS — R0789 Other chest pain: Secondary | ICD-10-CM | POA: Diagnosis not present

## 2018-02-22 DIAGNOSIS — R072 Precordial pain: Secondary | ICD-10-CM | POA: Diagnosis not present

## 2018-02-22 DIAGNOSIS — I2510 Atherosclerotic heart disease of native coronary artery without angina pectoris: Secondary | ICD-10-CM | POA: Diagnosis not present

## 2018-02-22 DIAGNOSIS — F329 Major depressive disorder, single episode, unspecified: Secondary | ICD-10-CM | POA: Diagnosis present

## 2018-02-22 DIAGNOSIS — I82B12 Acute embolism and thrombosis of left subclavian vein: Secondary | ICD-10-CM | POA: Diagnosis present

## 2018-02-22 DIAGNOSIS — K210 Gastro-esophageal reflux disease with esophagitis: Secondary | ICD-10-CM | POA: Diagnosis not present

## 2018-02-22 DIAGNOSIS — I200 Unstable angina: Secondary | ICD-10-CM | POA: Diagnosis not present

## 2018-02-22 DIAGNOSIS — I10 Essential (primary) hypertension: Secondary | ICD-10-CM | POA: Diagnosis present

## 2018-02-22 DIAGNOSIS — I708 Atherosclerosis of other arteries: Secondary | ICD-10-CM | POA: Diagnosis not present

## 2018-02-22 DIAGNOSIS — E876 Hypokalemia: Secondary | ICD-10-CM | POA: Diagnosis not present

## 2018-02-22 DIAGNOSIS — J449 Chronic obstructive pulmonary disease, unspecified: Secondary | ICD-10-CM | POA: Diagnosis present

## 2018-02-22 DIAGNOSIS — G43A1 Cyclical vomiting, intractable: Secondary | ICD-10-CM | POA: Diagnosis not present

## 2018-02-22 DIAGNOSIS — Z7982 Long term (current) use of aspirin: Secondary | ICD-10-CM | POA: Diagnosis not present

## 2018-02-22 DIAGNOSIS — K2270 Barrett's esophagus without dysplasia: Secondary | ICD-10-CM | POA: Diagnosis present

## 2018-02-22 DIAGNOSIS — F17210 Nicotine dependence, cigarettes, uncomplicated: Secondary | ICD-10-CM | POA: Diagnosis present

## 2018-02-22 DIAGNOSIS — Z720 Tobacco use: Secondary | ICD-10-CM | POA: Diagnosis not present

## 2018-02-22 DIAGNOSIS — R112 Nausea with vomiting, unspecified: Secondary | ICD-10-CM | POA: Diagnosis not present

## 2018-02-22 DIAGNOSIS — Z01810 Encounter for preprocedural cardiovascular examination: Secondary | ICD-10-CM | POA: Diagnosis not present

## 2018-02-22 DIAGNOSIS — I4510 Unspecified right bundle-branch block: Secondary | ICD-10-CM | POA: Diagnosis present

## 2018-02-22 DIAGNOSIS — R9439 Abnormal result of other cardiovascular function study: Secondary | ICD-10-CM

## 2018-02-22 DIAGNOSIS — Z79899 Other long term (current) drug therapy: Secondary | ICD-10-CM | POA: Diagnosis not present

## 2018-02-22 DIAGNOSIS — E782 Mixed hyperlipidemia: Secondary | ICD-10-CM | POA: Diagnosis present

## 2018-02-22 DIAGNOSIS — R079 Chest pain, unspecified: Secondary | ICD-10-CM | POA: Diagnosis not present

## 2018-02-22 DIAGNOSIS — I739 Peripheral vascular disease, unspecified: Secondary | ICD-10-CM | POA: Diagnosis present

## 2018-02-22 DIAGNOSIS — I361 Nonrheumatic tricuspid (valve) insufficiency: Secondary | ICD-10-CM | POA: Diagnosis not present

## 2018-02-22 DIAGNOSIS — I25110 Atherosclerotic heart disease of native coronary artery with unstable angina pectoris: Secondary | ICD-10-CM | POA: Diagnosis not present

## 2018-02-22 DIAGNOSIS — Z8249 Family history of ischemic heart disease and other diseases of the circulatory system: Secondary | ICD-10-CM | POA: Diagnosis not present

## 2018-02-22 DIAGNOSIS — K449 Diaphragmatic hernia without obstruction or gangrene: Secondary | ICD-10-CM | POA: Diagnosis not present

## 2018-02-22 LAB — BASIC METABOLIC PANEL
Anion gap: 13 (ref 5–15)
BUN: 5 mg/dL — AB (ref 6–20)
CHLORIDE: 101 mmol/L (ref 101–111)
CO2: 24 mmol/L (ref 22–32)
CREATININE: 0.74 mg/dL (ref 0.44–1.00)
Calcium: 8.3 mg/dL — ABNORMAL LOW (ref 8.9–10.3)
GFR calc Af Amer: >60 mL/min (ref >60)
GFR calc non Af Amer: >60 mL/min (ref >60)
Glucose, Bld: 123 mg/dL — ABNORMAL HIGH (ref 65–99)
Potassium: 2.6 mmol/L — CL (ref 3.5–5.1)
Sodium: 138 mmol/L (ref 135–145)

## 2018-02-22 LAB — URINALYSIS, ROUTINE W REFLEX MICROSCOPIC
Bilirubin Urine: NEGATIVE
Glucose, UA: NEGATIVE mg/dL
KETONES UR: NEGATIVE mg/dL
Leukocytes, UA: NEGATIVE
Nitrite: NEGATIVE
PH: 6 (ref 5.0–8.0)
Protein, ur: NEGATIVE mg/dL
SPECIFIC GRAVITY, URINE: 1.017 (ref 1.005–1.030)

## 2018-02-22 LAB — CBC
HCT: 42.7 % (ref 36.0–46.0)
Hemoglobin: 14.7 g/dL (ref 12.0–15.0)
MCH: 29.6 pg (ref 26.0–34.0)
MCHC: 34.4 g/dL (ref 30.0–36.0)
MCV: 86.1 fL (ref 78.0–100.0)
PLATELETS: 369 10*3/uL (ref 150–400)
RBC: 4.96 MIL/uL (ref 3.87–5.11)
RDW: 13.9 % (ref 11.5–15.5)
WBC: 13.9 10*3/uL — ABNORMAL HIGH (ref 4.0–10.5)

## 2018-02-22 LAB — MAGNESIUM
MAGNESIUM: 2.1 mg/dL (ref 1.7–2.4)
Magnesium: 2.2 mg/dL (ref 1.7–2.4)

## 2018-02-22 LAB — CBG MONITORING, ED
Glucose-Capillary: 103 mg/dL — ABNORMAL HIGH (ref 65–99)
Glucose-Capillary: 92 mg/dL (ref 65–99)

## 2018-02-22 LAB — GLUCOSE, CAPILLARY
GLUCOSE-CAPILLARY: 102 mg/dL — AB (ref 65–99)
Glucose-Capillary: 106 mg/dL — ABNORMAL HIGH (ref 65–99)

## 2018-02-22 LAB — TROPONIN I
Troponin I: <0.03 ng/mL (ref <0.03)
Troponin I: <0.03 ng/mL (ref <0.03)

## 2018-02-22 LAB — POTASSIUM: POTASSIUM: 3.1 mmol/L — AB (ref 3.5–5.1)

## 2018-02-22 MED ORDER — POTASSIUM CHLORIDE 10 MEQ/100ML IV SOLN
10.0000 meq | INTRAVENOUS | Status: AC
  Administered 2018-02-22 (×4): 10 meq via INTRAVENOUS
  Filled 2018-02-22 (×3): qty 100

## 2018-02-22 MED ORDER — POTASSIUM CHLORIDE IN NACL 40-0.9 MEQ/L-% IV SOLN
INTRAVENOUS | Status: AC
  Administered 2018-02-22: 100 mL/h via INTRAVENOUS
  Filled 2018-02-22: qty 1000

## 2018-02-22 MED ORDER — HEPARIN BOLUS VIA INFUSION
3000.0000 [IU] | Freq: Once | INTRAVENOUS | Status: AC
  Administered 2018-02-22: 3000 [IU] via INTRAVENOUS
  Filled 2018-02-22: qty 3000

## 2018-02-22 MED ORDER — HEPARIN (PORCINE) IN NACL 100-0.45 UNIT/ML-% IJ SOLN
1550.0000 [IU]/h | INTRAMUSCULAR | Status: DC
  Administered 2018-02-22: 900 [IU]/h via INTRAVENOUS
  Administered 2018-02-23: 1200 [IU]/h via INTRAVENOUS
  Administered 2018-02-25: 1550 [IU]/h via INTRAVENOUS
  Administered 2018-02-25: 1500 [IU]/h via INTRAVENOUS
  Administered 2018-02-26: 1550 [IU]/h via INTRAVENOUS
  Filled 2018-02-22 (×6): qty 250

## 2018-02-22 MED ORDER — POTASSIUM CHLORIDE 20 MEQ/15ML (10%) PO SOLN
50.0000 meq | Freq: Once | ORAL | Status: AC
  Administered 2018-02-22: 50 meq via ORAL
  Filled 2018-02-22: qty 45

## 2018-02-22 NOTE — ED Notes (Signed)
Patient stretcher linens changed and patient placed in a gown.

## 2018-02-22 NOTE — ED Notes (Signed)
Pt ambulated with steady gait. Tolerated well. Stand by assist for safety. Pt returned to bed on monitors resting comfortably.

## 2018-02-22 NOTE — Plan of Care (Signed)
  Activity: Risk for activity intolerance will decrease 02/22/2018 1505 - Progressing by Shanon Rosser, RN

## 2018-02-22 NOTE — Progress Notes (Signed)
ANTICOAGULATION CONSULT NOTE - Initial Consult  Pharmacy Consult for heparin Indication: chest pain/ACS  No Known Allergies  Patient Measurements: Height: 5\' 6"  (167.6 cm) Weight: 182 lb 12.8 oz (82.9 kg) IBW/kg (Calculated) : 59.3 Heparin Dosing Weight: 77 kg  Vital Signs: Temp: 99.1 F (37.3 C) (02/28 1457) Temp Source: Oral (02/28 1457) BP: 111/70 (02/28 1457) Pulse Rate: 75 (02/28 1457)  Labs: Recent Labs    02/21/18 1545  02/21/18 2307 02/22/18 0357 02/22/18 1432  HGB 14.7  --   --  14.7  --   HCT 43.0  --   --  42.7  --   PLT 359  --   --  369  --   CREATININE 0.95  --   --  0.74  --   TROPONINI <0.03   < > <0.03 <0.03 <0.03   < > = values in this interval not displayed.    Estimated Creatinine Clearance: 89.2 mL/min (by C-G formula based on SCr of 0.74 mg/dL).   Medical History: Past Medical History:  Diagnosis Date  . Arthritis    "qwhere" (02/22/2018)  . Barrett's esophagus with esophagitis 03/26/2013  . Chronic abdominal pain   . Chronic lower back pain   . Chronic pancreatitis ([place])   . COPD (chronic obstructive pulmonary disease) ([place])   . Depression   . GERD (gastroesophageal reflux disease)   . Heart murmur   . Hiatal hernia   . High cholesterol   . Hypertension   . Peptic ulcer   . Pneumonia 2000s X 1  . Tobacco use 03/26/2013    Medications:  Medications Prior to Admission  Medication Sig Dispense Refill Last Dose  . acetaminophen-codeine (TYLENOL #3) 300-30 MG tablet Take 1 tablet by mouth every 6 (six) hours as needed for pain. Every six to eight hours as needed for pain  0 02/20/2018 at Unknown time  . amLODipine (NORVASC) 5 MG tablet Take 1 tablet (5 mg total) by mouth daily. 30 tablet 0 02/20/2018 at Unknown time  . aspirin EC 81 MG tablet Take 1 tablet (81 mg total) by mouth daily.   02/21/2018 at Unknown time  . atorvastatin (LIPITOR) 40 MG tablet Take 1 tablet (40 mg total) by mouth daily. 90 tablet 3 02/20/2018 at Unknown time  .  dexlansoprazole (DEXILANT) 60 MG capsule Take 60 mg by mouth daily.   02/20/2018 at Unknown time  . escitalopram (LEXAPRO) 10 MG tablet Take 1 tablet (10 mg total) by mouth daily. 30 tablet 0 02/20/2018 at Unknown time  . ibuprofen (ADVIL,MOTRIN) 200 MG tablet Take 400 mg by mouth every 6 (six) hours as needed for headache or moderate pain.   02/20/2018 at Unknown time  . metoCLOPramide (REGLAN) 5 MG tablet Take 1 tablet (5 mg total) by mouth 3 (three) times daily before meals. 90 tablet 0 02/20/2018 at Unknown time  . ondansetron (ZOFRAN) 4 MG tablet Take 1 tablet (4 mg total) by mouth every 8 (eight) hours as needed for nausea or vomiting. 20 tablet 0 unknown at prn  . penicillin v potassium (VEETID) 500 MG tablet Take 500 mg by mouth 2 (two) times daily.   02/20/2018 at Unknown time  . potassium chloride SA (K-DUR,KLOR-CON) 20 MEQ tablet Take 20 mEq by mouth daily.   02/20/2018 at Unknown time  . traZODone (DESYREL) 50 MG tablet Take 1 tablet (50 mg total) by mouth at bedtime. 30 tablet 3 02/20/2018 at Unknown time    Assessment: Pt presenting with chest pain,  nausea, and vomiting, with Positive nuclear med stress test. Vascular surgery was planning for Left carotid to subclavian bypass. Pharmacy consulted to start heparin. Not on anticoagulation PTA. Received Lovenox 40 mg 2/28 at 0055. CBC wnl.  Goal of Therapy:  Heparin level 0.3-0.7 units/ml Monitor platelets by anticoagulation protocol: Yes   Plan:  Give heparin 3000 unit bolus x 1 Start heparin infusion at 900 units/hr Check anti-Xa level in 6 hours and daily while on heparin Continue to monitor H&H and platelets   Thank you for allowing [place] to participate in this patients care.  Jens Som, PharmD Clinical phone for 02/22/2018: [phone] If after 10:30p, please call main pharmacy at: [phone] 02/22/2018 7:01 PM

## 2018-02-22 NOTE — Progress Notes (Addendum)
PROGRESS NOTE    Rita [name]  KNL:[id number] DOB: Aug 30, 1966 DOA: 02/21/2018 PCP: Caren Macadam, MD   Brief Narrative:  52 y.o. WF PMHx GERD with Barrett esophagus and esophagitis, HTN, Heart murmur, LEFT subclavian stenosis followed by vascular surgery Chronic abdominal pain/ back pain, Hiatal Hernia, Pancreatitis,  Tobacco use  Presenting to the chest pain, nausea, and vomiting.  Patient reports that she has chronic recurrent episodes of nausea with vomiting that she is usually able to control with antiemetics at home.  Today, she underwent a nuclear medicine stress test as part of preoperative cardiac evaluation (preparing for left carotid to subclavian bypass), and developed chest pain with nausea during the test.  She reports vomiting once during the stress test, but then went on to have many episodes of nonbloody vomiting afterwards.  She has been unable to control her symptoms with home medications.  She also reports a chest pain, localized to an area just left of center, constant, waxing and waning in severity and associated with nausea and mild dyspnea.  She denies abdominal pain or diarrhea.   ED Course: Upon arrival to the ED, patient is found to be afebrile, saturating well on room air, with vitals otherwise normal.  EKG features a sinus rhythm with right bundle branch block and no significant change from prior.  Chest x-ray notable for borderline cardiomegaly and pulmonary vascular congestion.  Chemistry panel features a potassium of 3.0, bicarbonate of 15, and anion gap of 20.  CBC is notable for leukocytosis to 12,600.  Troponin is undetectable.  Acid is reassuring at 1.52.  CTA chest/abdomen/pelvis is negative for acute vascular finding, but notable for the known occlusion of the proximal left subclavian.  Patient was treated with 2 L of normal saline, 30 mEq of potassium, morphine, Zofran, Phenergan, Reglan, and Ativan in the ED.  She remains hemodynamically stable, but continues to  have intractable nausea with nonbloody vomiting as well as persistent chest pain, and will be observed in the stepdown unit for ongoing evaluation and management of this.    Subjective: 2/28 A/O 4, negative SOB. Positive substernal CP radiated 8/10, positive radiation left jaw. Negative N/V. States yesterday positive severe N/V. Negative new foods, has not eaten at any unknown/different locations, negative recent travel. Negative sick contacts.    Assessment & Plan:   Principal Problem:   Intractable nausea and vomiting Active Problems:   Hypokalemia   Hiatal hernia with gastroesophageal reflux disease and esophagitis   CAD (coronary artery disease)   Chest pain   Left subclavian artery occlusion   Abnormal (Positive) nuclear med stress test -Intermediate risk study with defective moderate severity consistent with ischemia. See results below -Echocardiogram pending -Heparin drip per pharmacy -Nothing by mouth after 0500  Chest pain  -Multifactorial hiatal hernia, cardiac? -Reproducible sternal chest pain -CTA chest/abdomen/pelvis negative for overt vascular process. See results below -Serial cardiac enzymes negative Recent Labs  Lab 02/21/18 1545 02/21/18 2040 02/21/18 2307 02/22/18 0357  TROPONINI <0.03 <0.03 <0.03 <0.03  -ASA -Currently patient's BP soft, will hold on starting beta blocker or ACEI  LEFT subclavian artery occlusion -Vascular surgery planning for LEFT carotid to subclavian bypass  Intractable nausea and vomiting -Patient has chronic nausea with frequent vomiting usually controlled with antiemetics at home. Most likely secondary to hiatal hernia. -Resolved -Pepcid 20 mg BID -Reglan 10 mg TID -Protonix 40 mg daily -Phenergan PRN   GERD/hiatal hernia   - Hx of hiatal hernia and Barrett's with esophagitis  - Takes  daily PPI, will continue, IV Pepcid added given intractable N/V   -May require GI consult   Hypokalemia -Potassium IV 50  mEq -Normal saline+ KCl 40 mEq 158ml/hr -Repeat K/Mg @ 1300---> potassium low : Repeat potassium 50 mEq   DVT prophylaxis: Heparin Code Status: Full Family Communication: Family at bedside for discussion of plan care Disposition Plan:    Consultants:  None  Procedures/Significant Events:   -2/21 Echocardiogram:Left ventricle: The cavity size was normal. Wall thickness was   normal. Systolic function was normal. The estimated ejection   fraction was 60%. Wall motion was normal; there were no regional   wall motion abnormalities. Left ventricular diastolic function   parameters were normal. - Mitral valve: There was mild regurgitation. - Tricuspid valve: There was mild regurgitation. ------------------------------------------------------------------------------------------------------------------------------------------ 2/27 Nuclear medicine stress test:-Negative ST segment deviation noted during stress. -Medium defect of moderate severity present in the mid anterior, mid anteroseptal, apical anterior and apical septal location. Findings consistent with ischemia. -This is an intermediate risk study. -LVEF = 62%. 2/27 CT angiogram chest/abdomen/pelvis:No definite acute vascular findings in the chest, abdomen or pelvis. No evidence of aneurysm or dissection. -High-grade stenosis or focal short segment occlusion of the proximal left subclavian artery, proximal to the vertebral artery.This predisposes to subclavian steal syndrome. -Hiatal hernia and distal esophageal wall thickening, similar to previous studies. -Hepatic steatosis.     I have personally reviewed and interpreted all radiology studies and my findings are as above.  VENTILATOR SETTINGS:    Cultures   Antimicrobials:    Devices    LINES / TUBES:      Continuous Infusions: . 0.9 % NaCl with KCl 40 mEq / L 100 mL/hr (02/22/18 0640)  . famotidine (PEPCID) IV Stopped (02/22/18 0051)      Objective: Vitals:   02/22/18 0433 02/22/18 0500 02/22/18 0600 02/22/18 0730  BP: 100/76 108/70 110/60 102/61  Pulse: 71 71 72 69  Resp: 18 19 14 18   Temp:      TempSrc:      SpO2: 95% 94% 98% 96%  Weight:      Height:        Intake/Output Summary (Last 24 hours) at 02/22/2018 1660 Last data filed at 02/22/2018 6301 Gross per 24 hour  Intake 2500 ml  Output -  Net 2500 ml   Filed Weights   02/21/18 1545  Weight: 191 lb (86.6 kg)    Examination:  General: a/O 4,No acute respiratory distress Neck:  Negative scars, masses, torticollis, lymphadenopathy, JVD Lungs: Clear to auscultation bilaterally without wheezes or crackles Cardiovascular: Regular rate and rhythm without murmur gallop or rub normal S1 and S2. Positive pain to palpation substernal Abdomen: negative abdominal pain, nondistended, positive soft, bowel sounds, no rebound, no ascites, no appreciable mass Extremities: No significant cyanosis, clubbing, or edema bilateral lower extremities Skin: Negative rashes, lesions, ulcers Psychiatric:  Negative depression, negative anxiety, negative fatigue, negative mania  Central nervous system:  Cranial nerves II through XII intact, tongue/uvula midline, all extremities muscle strength 5/5, sensation intact throughout,negative dysarthria, negative expressive aphasia, negative receptive aphasia.  .     Data Reviewed: Care during the described time interval was provided by me .  I have reviewed this patient's available data, including medical history, events of note, physical examination, and all test results as part of my evaluation.   CBC: Recent Labs  Lab 02/21/18 1545 02/22/18 0357  WBC 12.6* 13.9*  NEUTROABS 10.8*  --   HGB 14.7 14.7  HCT 43.0  42.7  MCV 87.8 86.1  PLT 359 161   Basic Metabolic Panel: Recent Labs  Lab 02/21/18 1545 02/22/18 0357  NA 138 138  K 3.0* 2.6*  CL 103 101  CO2 15* 24  GLUCOSE 150* 123*  BUN 11 5*  CREATININE 0.95 0.74   CALCIUM 9.1 8.3*  MG  --  2.2   GFR: Estimated Creatinine Clearance: 91.2 mL/min (by C-G formula based on SCr of 0.74 mg/dL). Liver Function Tests: Recent Labs  Lab 02/21/18 1545  AST 28  ALT 19  ALKPHOS 72  BILITOT 0.8  PROT 7.5  ALBUMIN 4.2   Recent Labs  Lab 02/21/18 1545  LIPASE 33   No results for input(s): AMMONIA in the last 168 hours. Coagulation Profile: No results for input(s): INR, PROTIME in the last 168 hours. Cardiac Enzymes: Recent Labs  Lab 02/21/18 1545 02/21/18 2040 02/21/18 2307 02/22/18 0357  TROPONINI <0.03 <0.03 <0.03 <0.03   BNP (last 3 results) No results for input(s): PROBNP in the last 8760 hours. HbA1C: No results for input(s): HGBA1C in the last 72 hours. CBG: Recent Labs  Lab 02/21/18 2244 02/22/18 0818  GLUCAP 155* 103*   Lipid Profile: No results for input(s): CHOL, HDL, LDLCALC, TRIG, CHOLHDL, LDLDIRECT in the last 72 hours. Thyroid Function Tests: No results for input(s): TSH, T4TOTAL, FREET4, T3FREE, THYROIDAB in the last 72 hours. Anemia Panel: No results for input(s): VITAMINB12, FOLATE, FERRITIN, TIBC, IRON, RETICCTPCT in the last 72 hours. Urine analysis:    Component Value Date/Time   COLORURINE YELLOW 02/22/2018 0803   APPEARANCEUR CLEAR 02/22/2018 0803   LABSPEC 1.017 02/22/2018 0803   PHURINE 6.0 02/22/2018 0803   GLUCOSEU NEGATIVE 02/22/2018 0803   HGBUR SMALL (A) 02/22/2018 0803   BILIRUBINUR NEGATIVE 02/22/2018 0803   KETONESUR NEGATIVE 02/22/2018 0803   PROTEINUR NEGATIVE 02/22/2018 0803   UROBILINOGEN 0.2 02/05/2015 2347   NITRITE NEGATIVE 02/22/2018 0803   LEUKOCYTESUR NEGATIVE 02/22/2018 0803   Sepsis Labs: @LABRCNTIP (procalcitonin:4,lacticidven:4)  )No results found for this or any previous visit (from the past 240 hour(s)).       Radiology Studies: Dg Chest 2 View  Result Date: 02/21/2018 CLINICAL DATA:  Left carotid occlusion.  Chest pain. EXAM: CHEST  2 VIEW COMPARISON:  Two-view chest  x-ray 12/30/2016. FINDINGS: Heart is enlarged. Mild pulmonary vascular congestion is present without frank edema. There are no effusions. The visualized soft tissues and bony thorax are unremarkable. IMPRESSION: Borderline cardiomegaly and mild pulmonary vascular congestion. Electronically Signed   By: San Morelle M.D.   On: 02/21/2018 16:34   Nm Myocar Multi W/spect W/wall Motion / Ef  Result Date: 02/21/2018  There was no ST segment deviation noted during stress.  Defect 1: There is a medium defect of moderate severity present in the mid anterior, mid anteroseptal, apical anterior and apical septal location.  Findings consistent with ischemia.  This is an intermediate risk study.  Nuclear stress EF: 62%.    Ct Angio Chest/abd/pel For Dissection W And/or Wo Contrast  Result Date: 02/21/2018 CLINICAL DATA:  Acute onset of extreme chest pain today. Current smoker. Pancreatitis. EXAM: CT ANGIOGRAPHY CHEST, ABDOMEN AND PELVIS TECHNIQUE: Multidetector CT imaging through the chest, abdomen and pelvis was performed using the standard protocol during bolus administration of intravenous contrast. Multiplanar reconstructed images and MIPs were obtained and reviewed to evaluate the vascular anatomy. CONTRAST:  171mL ISOVUE-370 IOPAMIDOL (ISOVUE-370) INJECTION 76% COMPARISON:  Chest CTA 02/06/2015.  Abdominal CT 07/19/2017. FINDINGS: CTA CHEST FINDINGS Cardiovascular: Precontrast  images through the chest demonstrate mild aortic atherosclerosis, but no displaced intimal calcifications. There are mild coronary artery calcifications as well. Following contrast, the aortic lumen opacifies normally. There is no aneurysm or dissection. However, there is evidence of high-grade stenosis or short segment occlusion of the proximal left subclavian artery (image 18/7 and 103/11). This is proximal to the left vertebral artery which is patent. The pulmonary arteries are patent. The heart size is normal. There is no  pericardial effusion. Mediastinum/Nodes: There are no enlarged mediastinal, hilar or axillary lymph nodes. There is a small hiatal hernia with mild distal esophageal wall thickening, similar to previous studies. Lungs/Pleura: There is no pleural effusion. Mild dependent atelectasis in both lungs. There is a stable small right lower lobe calcified granuloma. No suspicious pulmonary nodules. Musculoskeletal/Chest wall: There is no chest wall mass or suspicious osseous finding. Review of the MIP images confirms the above findings. CTA ABDOMEN AND PELVIS FINDINGS VASCULAR Aorta: Atherosclerosis without aneurysm or dissection. Celiac: Widely patent. SMA: Widely patent. Renals: Duplicated on the right. Mild atherosclerosis. No significant stenosis. IMA: Patent. Inflow: Bilateral common and external iliac atherosclerosis. No significant stenosis. Veins: Limited opacification.  No significant findings. Review of the MIP images confirms the above findings. NON-VASCULAR Hepatobiliary: Hepatic steatosis again noted. There is a stable small cyst in segment 5. No significant biliary dilatation status post cholecystectomy. Pancreas: Unremarkable. No pancreatic ductal dilatation or surrounding inflammatory changes. Spleen: Normal in size without focal abnormality. Adrenals/Urinary Tract: Both adrenal glands appear normal. No significant kidney findings. The previously demonstrated small renal cysts are not well visualized. No evidence of urinary tract calculus or hydronephrosis. The bladder appears normal. Stomach/Bowel: No evidence of bowel wall thickening, distention or surrounding inflammatory change. The appendix appears normal. Lymphatic: There are no enlarged abdominal or pelvic lymph nodes. Reproductive: The uterus and ovaries appear normal. No adnexal mass. Other: No evidence of abdominal wall mass or hernia. No ascites. Musculoskeletal: No acute or significant osseous findings. Stable bone islands in the pelvis. Review of  the MIP images confirms the above findings. IMPRESSION: 1. No definite acute vascular findings in the chest, abdomen or pelvis. No evidence of aneurysm or dissection. 2. High-grade stenosis or focal short segment occlusion of the proximal left subclavian artery, proximal to the vertebral artery. This predisposes to subclavian steal syndrome. 3.  Aortic Atherosclerosis (ICD10-I70.0). 4. Hiatal hernia and distal esophageal wall thickening, similar to previous studies. 5. Hepatic steatosis. Electronically Signed   By: Richardean Sale M.D.   On: 02/21/2018 20:43        Scheduled Meds: . amLODipine  5 mg Oral Daily  . aspirin EC  81 mg Oral Daily  . atorvastatin  40 mg Oral q1800  . enoxaparin (LOVENOX) injection  40 mg Subcutaneous Q24H  . escitalopram  10 mg Oral Daily  . insulin aspart  0-5 Units Subcutaneous QHS  . insulin aspart  0-9 Units Subcutaneous TID WC  . metoCLOPramide (REGLAN) injection  10 mg Intravenous Q8H  . pantoprazole  40 mg Oral Daily  . penicillin v potassium  500 mg Oral BID  . potassium chloride SA  20 mEq Oral Daily  . potassium chloride SA  40 mEq Oral Once  . traZODone  50 mg Oral QHS   Continuous Infusions: . 0.9 % NaCl with KCl 40 mEq / L 100 mL/hr (02/22/18 0640)  . famotidine (PEPCID) IV Stopped (02/22/18 0051)     LOS: 0 days    Time spent: 40 minutes  Allie Bossier, MD [facility] Pager [phone]   If 7PM-7AM, please contact night-coverage [email or web address] Password [facility] 02/22/2018, 9:03 AM

## 2018-02-23 ENCOUNTER — Inpatient Hospital Stay (HOSPITAL_COMMUNITY): Payer: Medicare Other

## 2018-02-23 ENCOUNTER — Other Ambulatory Visit: Payer: Self-pay

## 2018-02-23 DIAGNOSIS — K449 Diaphragmatic hernia without obstruction or gangrene: Secondary | ICD-10-CM

## 2018-02-23 DIAGNOSIS — Z720 Tobacco use: Secondary | ICD-10-CM

## 2018-02-23 DIAGNOSIS — I200 Unstable angina: Secondary | ICD-10-CM

## 2018-02-23 DIAGNOSIS — I25110 Atherosclerotic heart disease of native coronary artery with unstable angina pectoris: Principal | ICD-10-CM

## 2018-02-23 DIAGNOSIS — K210 Gastro-esophageal reflux disease with esophagitis: Secondary | ICD-10-CM

## 2018-02-23 DIAGNOSIS — I361 Nonrheumatic tricuspid (valve) insufficiency: Secondary | ICD-10-CM

## 2018-02-23 DIAGNOSIS — I708 Atherosclerosis of other arteries: Secondary | ICD-10-CM

## 2018-02-23 LAB — URINE CULTURE

## 2018-02-23 LAB — GLUCOSE, CAPILLARY
Glucose-Capillary: 132 mg/dL — ABNORMAL HIGH (ref 65–99)
Glucose-Capillary: 90 mg/dL (ref 65–99)
Glucose-Capillary: 88 mg/dL (ref 65–99)

## 2018-02-23 LAB — BASIC METABOLIC PANEL
ANION GAP: 11 (ref 5–15)
BUN: 11 mg/dL (ref 6–20)
CO2: 19 mmol/L — ABNORMAL LOW (ref 22–32)
CREATININE: 0.86 mg/dL (ref 0.44–1.00)
Calcium: 8.2 mg/dL — ABNORMAL LOW (ref 8.9–10.3)
Chloride: 107 mmol/L (ref 101–111)
GFR calc non Af Amer: >60 mL/min (ref >60)
Glucose, Bld: 83 mg/dL (ref 65–99)
Potassium: 3.2 mmol/L — ABNORMAL LOW (ref 3.5–5.1)
SODIUM: 137 mmol/L (ref 135–145)

## 2018-02-23 LAB — CBC
HEMATOCRIT: 37 % (ref 36.0–46.0)
Hemoglobin: 12.1 g/dL (ref 12.0–15.0)
MCH: 29.6 pg (ref 26.0–34.0)
MCHC: 32.7 g/dL (ref 30.0–36.0)
MCV: 90.5 fL (ref 78.0–100.0)
Platelets: 330 10*3/uL (ref 150–400)
RBC: 4.09 MIL/uL (ref 3.87–5.11)
RDW: 14.5 % (ref 11.5–15.5)
WBC: 8 10*3/uL (ref 4.0–10.5)

## 2018-02-23 LAB — MAGNESIUM: MAGNESIUM: 2.1 mg/dL (ref 1.7–2.4)

## 2018-02-23 LAB — HEPARIN LEVEL (UNFRACTIONATED)
Heparin Unfractionated: <0.1 IU/mL — ABNORMAL LOW (ref 0.30–0.70)
Heparin Unfractionated: 0.36 IU/mL (ref 0.30–0.70)

## 2018-02-23 LAB — ECHOCARDIOGRAM LIMITED
HEIGHTINCHES: 66 in
Weight: 2939.2 oz

## 2018-02-23 MED ORDER — ENSURE ENLIVE PO LIQD
237.0000 mL | Freq: Two times a day (BID) | ORAL | Status: DC
  Administered 2018-02-25 (×2): 237 mL via ORAL

## 2018-02-23 MED ORDER — ADULT MULTIVITAMIN W/MINERALS CH
1.0000 | ORAL_TABLET | Freq: Every day | ORAL | Status: DC
  Administered 2018-02-23 – 2018-02-26 (×4): 1 via ORAL
  Filled 2018-02-23 (×4): qty 1

## 2018-02-23 MED ORDER — HEPARIN BOLUS VIA INFUSION
3000.0000 [IU] | Freq: Once | INTRAVENOUS | Status: AC
  Administered 2018-02-23: 3000 [IU] via INTRAVENOUS
  Filled 2018-02-23: qty 3000

## 2018-02-23 NOTE — Progress Notes (Signed)
ANTICOAGULATION CONSULT NOTE - Follow Up Consult  Pharmacy Consult for Heparin Indication: chest pain/ACS  No Known Allergies  Patient Measurements: Height: 5\' 6"  (167.6 cm) Weight: 183 lb 11.2 oz (83.3 kg) IBW/kg (Calculated) : 59.3 Heparin Dosing Weight: 77 kg  Vital Signs: Temp: 98.8 F (37.1 C) (03/01 0853) Temp Source: Oral (03/01 0853) BP: 104/67 (03/01 1118) Pulse Rate: 58 (03/01 0400)  Labs: Recent Labs    02/21/18 1545  02/21/18 2307 02/22/18 0357 02/22/18 1432 02/23/18 0220 02/23/18 0936  HGB 14.7  --   --  14.7  --   --  12.1  HCT 43.0  --   --  42.7  --   --  37.0  PLT 359  --   --  369  --   --  330  HEPARINUNFRC  --   --   --   --   --  <0.10* 0.36  CREATININE 0.95  --   --  0.74  --   --   --   TROPONINI <0.03   < > <0.03 <0.03 <0.03  --   --    < > = values in this interval not displayed.    Estimated Creatinine Clearance: 89.5 mL/min (by C-G formula based on SCr of 0.74 mg/dL).  Assessment:  Pt presented 02/22/18 with chest pain, nausea, and vomiting, with Positive nuclear med stress test. Vascular surgery was planning for Left carotid to subclavian bypass. Pharmacy consulted to start heparin. Not on anticoagulation PTA.     Initial heparin level was undetectable overnight, got re-bolus and drip rate increased.   Heparin level is now therapeutic (0.36) on 1200 units/hr.    Goal of Therapy:  Heparin level 0.3-0.7 units/ml Monitor platelets by anticoagulation protocol: Yes   Plan:   Continue heparin drip at 1200 units/hr  Recheck heparin level this afternoon to be sure it stays in target range.  Daily heparin level and CBC while on heparin.  Arty Baumgartner , [place] Pager: [phone] or [phone] 02/23/2018,11:43 AM

## 2018-02-23 NOTE — Consult Note (Addendum)
Cardiology Consultation:   Patient ID: Rita [name]; [id number]; 1966-06-30   Admit date: 02/21/2018 Date of Consult: 02/23/2018  Primary Care Provider: Caren Macadam, MD Primary Cardiologist: Dr. Bronson [name]   Patient Profile:   Rita [name] is a 52 y.o. female with a hx of COPD, hypertension, hyperlipidemia, Barrett's esophagus with esophagitis, peptic ulcer disease, ongoing tobacco abuse and left subclavian stenosis who is being seen today for the evaluation of abnormal stress test at the request of Dr. Sherral [name].  Patient was seen by Dr. Bronson [name] 02/14/18 for preoperative risk stratification for recent finding of left subclavian artery stenosis.  Recommended stress test for risk stratification.  Carotid Dopplers on 01/18/18 showed bilateral 1-39% internal carotid artery stenosis.  Lipids 01/17/18: Total cholesterol 272, HDL 42, triglycerides 348, LDL 171.  She was recently switched from simvastatin 10 mg to Lipitor 40 mg by her PCP on 02/08/18.  A CT of the abdomen and pelvis on 12/30/16 demonstrated aortic atherosclerosis.  It also demonstrated a small hiatal hernia and hepatic steatosis.  History of Present Illness:   Rita [name] underwent Lexiscan Myoview on 02/21/18.  During stress test patient had mild chest discomfort and nausea but eventually resolved and went home.  A few hours after returning from stress test she started having severe substernal chest pressure with shortness of breath and multiple rounds of vomiting.  EMS was called and she came to ER for evaluation.  CTA chest/abdomen/pelvis is negative for acute vascular finding, but notable for the known occlusion of the proximal left subclavian.  Patient was treated with 2 L of normal saline, 30 mEq of potassium, morphine, Zofran, Phenergan, Reglan, and Ativan in the ED.    Chest pain improved with sublingual nitroglycerin and IV morphine.  Patient continued to have intermittent chest pressure since admission.  Resolved with  sublingual nitroglycerin.  He is on IV heparin and IV Pepcid.  Stress test was an intermediate-risk study with a defect of moderate severity consistent with ischemia, EF 62%.  Echocardiogram showed LV function of 55-60%, no wall motion abnormality.  EKG shows sinus bradycardia at rate of 58 bpm with right bundle branch block-personally reviewed.  Review of telemetry shows rate persistently in the 50s-personally reviewed.  Her mother had a MI at age 28.  Father had history of CAD in his early 78s.  Ongoing tobacco smoking for past 30 years.  Past Medical History:  Diagnosis Date  . Arthritis    "qwhere" (02/22/2018)  . Barrett's esophagus with esophagitis 03/26/2013  . Chronic abdominal pain   . Chronic lower back pain   . Chronic pancreatitis ([place])   . COPD (chronic obstructive pulmonary disease) ([place])   . Depression   . GERD (gastroesophageal reflux disease)   . Heart murmur   . Hiatal hernia   . High cholesterol   . Hypertension   . Peptic ulcer   . Pneumonia 2000s X 1  . Tobacco use 03/26/2013    Past Surgical History:  Procedure Laterality Date  . AORTIC ARCH ANGIOGRAPHY N/A 01/19/2018   Procedure: AORTIC ARCH ANGIOGRAPHY;  Surgeon: Elam Dutch, MD;  Location: [facility];  Service: Cardiovascular;  Laterality: N/A;  . BIOPSY  04/17/2017   Procedure: BIOPSY;  Surgeon: Daneil Dolin, MD;  [facility];  Service: Endoscopy;;  esophageal  . [place]; 1988  . COLONOSCOPY WITH PROPOFOL N/A 04/17/2017   Procedure: COLONOSCOPY WITH PROPOFOL;  Surgeon: Daneil Dolin, MD;  [facility]: [facility];  Service: Endoscopy;  Laterality: N/A;  100  . ESOPHAGOGASTRODUODENOSCOPY  Oct 2011   Dr. Laural Golden: ulcer in distal esophagus, soft stricture at GE junction s/p balloon dilation PATH: BARRETT'S  . ESOPHAGOGASTRODUODENOSCOPY (EGD) WITH ESOPHAGEAL DILATION N/A 03/27/2013   Procedure: ESOPHAGOGASTRODUODENOSCOPY (EGD) WITH ESOPHAGEAL DILATION;  Surgeon: Daneil Dolin, MD;  [facility];  Service: Endoscopy;  Laterality: N/A;  possible dilation  . ESOPHAGOGASTRODUODENOSCOPY (EGD) WITH PROPOFOL N/A 01/02/2017   Procedure: ESOPHAGOGASTRODUODENOSCOPY (EGD) WITH PROPOFOL;  Surgeon: Daneil Dolin, MD;  [facility];  Service: Endoscopy;  Laterality: N/A;  with possible esophageal dilation  . ESOPHAGOGASTRODUODENOSCOPY (EGD) WITH PROPOFOL N/A 04/17/2017   Procedure: ESOPHAGOGASTRODUODENOSCOPY (EGD) WITH PROPOFOL;  Surgeon: Daneil Dolin, MD;  [facility];  Service: Endoscopy;  Laterality: N/A;  . EUS N/A 05/09/2013   Procedure: UPPER ENDOSCOPIC ULTRASOUND (EUS) LINEAR;  Surgeon: Milus Banister, MD;  Location: [facility];  Service: Endoscopy;  Laterality: N/A;  . FRACTURE SURGERY    . LAPAROSCOPIC CHOLECYSTECTOMY    . MALONEY DILATION N/A 04/17/2017   Procedure: Venia Minks DILATION;  Surgeon: Daneil Dolin, MD;  [facility];  Service: Endoscopy;  Laterality: N/A;  . PATELLA FRACTURE SURGERY Left   . POLYPECTOMY  04/17/2017   Procedure: POLYPECTOMY;  Surgeon: Daneil Dolin, MD;  [facility];  Service: Endoscopy;;  . TUBAL LIGATION  1992  . UPPER EXTREMITY ANGIOGRAPHY N/A 01/19/2018   Procedure: UPPER EXTREMITY ANGIOGRAPHY;  Surgeon: Elam Dutch, MD;  Location: [facility];  Service: Cardiovascular;  Laterality: N/A;     Inpatient Medications: Scheduled Meds: . amLODipine  5 mg Oral Daily  . aspirin EC  81 mg Oral Daily  . atorvastatin  40 mg Oral q1800  . escitalopram  10 mg Oral Daily  . feeding supplement (ENSURE ENLIVE)  237 mL Oral BID BM  . insulin aspart  0-5 Units Subcutaneous QHS  . insulin aspart  0-9 Units Subcutaneous TID WC  . metoCLOPramide (REGLAN) injection  10 mg Intravenous Q8H  . multivitamin with minerals  1 tablet Oral Daily  . pantoprazole  40 mg Oral Daily  . penicillin v potassium  500 mg Oral BID  . traZODone  50 mg Oral QHS   Continuous Infusions: .  famotidine (PEPCID) IV Stopped (02/23/18 1129)  . heparin 1,200 Units/hr (02/23/18 1349)   PRN Meds: acetaminophen, hydrALAZINE, morphine injection, nitroGLYCERIN, ondansetron (ZOFRAN) IV, promethazine  Allergies:   No Known Allergies  Social History:   Social History   Socioeconomic History  . Marital status: Divorced    Spouse name: Not on file  . Number of children: Not on file  . Years of education: Not on file  . Highest education level: Not on file  Social Needs  . Financial resource strain: Not on file  . Food insecurity - worry: Not on file  . Food insecurity - inability: Not on file  . Transportation needs - medical: Not on file  . Transportation needs - non-medical: Not on file  Occupational History  . Occupation: farm  Tobacco Use  . Smoking status: Current Every Day Smoker    Packs/day: 0.50    Years: 41.00    Pack years: 20.50    Types: Cigarettes    Start date: 09/26/1982  . Smokeless tobacco: Never Used  Substance and Sexual Activity  . Alcohol use: No  . Drug use: No  . Sexual activity: Not Currently  Partners: Male    Birth control/protection: Surgical  Other Topics Concern  . Not on file  Social History Narrative   Lives in [place], [place].   Is on disability, was a Psychologist, sport and exercise and drove tractors.    Wears seatbelt.   Cannot eat meat due to esophagus.   Smokes cigarettes.   Drinks sodas, 1/2 liter a day.    3 boys and 1 girl. 2 boys live in [place]. 1 son died in 44th month of pregnancy.   Live with friend and his wife at this time.   Used to be married and was abandoned, he would not give her a divorce.       Family History:    Family History  Problem Relation Age of Onset  . Asthma Mother   . Heart failure Mother   . Cancer Mother        pancreatic  . Diabetes Mother   . Hypertension Mother   . Stroke Mother   . Pancreatic cancer Mother        deceased  . Heart failure Father   . Diabetes Father   . Colon cancer Neg Hx      ROS:    Please see the history of present illness.  All other ROS reviewed and negative.     Physical Exam/Data:   Vitals:   02/23/18 0853 02/23/18 1110 02/23/18 1118 02/23/18 1230  BP:  121/67 104/67   Pulse:      Resp:      Temp: 98.8 F (37.1 C)   97.7 F (36.5 C)  TempSrc: Oral   Oral  SpO2: 98%   97%  Weight:      Height:        Intake/Output Summary (Last 24 hours) at 02/23/2018 1436 Last data filed at 02/23/2018 1000 Gross per 24 hour  Intake 753.55 ml  Output -  Net 753.55 ml   Filed Weights   02/21/18 1545 02/22/18 1614 02/23/18 0500  Weight: 191 lb (86.6 kg) 182 lb 12.8 oz (82.9 kg) 183 lb 11.2 oz (83.3 kg)   Body mass index is 29.65 kg/m.  General:  Well nourished, well developed, in no acute distress HEENT: normal Lymph: no adenopathy Neck: no JVD Endocrine:  No thryomegaly Vascular: No carotid bruits; FA pulses 2+ bilaterally without bruits  Cardiac:  normal S1, S2; RRR; systolic murmur  Lungs:  clear to auscultation bilaterally, no wheezing, rhonchi or rales  Abd: soft, nontender, no hepatomegaly  Ext: no edema Musculoskeletal:  No deformities, BUE and BLE strength normal and equal Skin: warm and dry  Neuro:  CNs 2-12 intact, no focal abnormalities noted Psych:  Normal affect   Relevant CV Studies: As summarized above  Laboratory Data:  Chemistry Recent Labs  Lab 02/21/18 1545 02/22/18 0357 02/22/18 1432 02/23/18 1150  NA 138 138  --  137  K 3.0* 2.6* 3.1* 3.2*  CL 103 101  --  107  CO2 15* 24  --  19*  GLUCOSE 150* 123*  --  83  BUN 11 5*  --  11  CREATININE 0.95 0.74  --  0.86  CALCIUM 9.1 8.3*  --  8.2*  GFRNONAA >60 >60  --  >60  GFRAA >60 >60  --  >60  ANIONGAP 20* 13  --  11    Recent Labs  Lab 02/21/18 1545  PROT 7.5  ALBUMIN 4.2  AST 28  ALT 19  ALKPHOS 72  BILITOT 0.8   Hematology Recent Labs  Lab 02/21/18 1545 02/22/18 0357 02/23/18 0936  WBC 12.6* 13.9* 8.0  RBC 4.90 4.96 4.09  HGB 14.7 14.7 12.1  HCT 43.0 42.7  37.0  MCV 87.8 86.1 90.5  MCH 30.0 29.6 29.6  MCHC 34.2 34.4 32.7  RDW 13.8 13.9 14.5  PLT 359 369 330   Cardiac Enzymes Recent Labs  Lab 02/21/18 1545 02/21/18 2040 02/21/18 2307 02/22/18 0357 02/22/18 1432  TROPONINI <0.03 <0.03 <0.03 <0.03 <0.03   No results for input(s): TROPIPOC in the last 168 hours.   Radiology/Studies:  Dg Chest 2 View  Result Date: 02/21/2018 CLINICAL DATA:  Left carotid occlusion.  Chest pain. EXAM: CHEST  2 VIEW COMPARISON:  Two-view chest x-ray 12/30/2016. FINDINGS: Heart is enlarged. Mild pulmonary vascular congestion is present without frank edema. There are no effusions. The visualized soft tissues and bony thorax are unremarkable. IMPRESSION: Borderline cardiomegaly and mild pulmonary vascular congestion. Electronically Signed   By: San Morelle M.D.   On: 02/21/2018 16:34   Nm Myocar Multi W/spect W/wall Motion / Ef  Result Date: 02/21/2018  There was no ST segment deviation noted during stress.  Defect 1: There is a medium defect of moderate severity present in the mid anterior, mid anteroseptal, apical anterior and apical septal location.  Findings consistent with ischemia.  This is an intermediate risk study.  Nuclear stress EF: 62%.    Ct Angio Chest/abd/pel For Dissection W And/or Wo Contrast  Result Date: 02/21/2018 CLINICAL DATA:  Acute onset of extreme chest pain today. Current smoker. Pancreatitis. EXAM: CT ANGIOGRAPHY CHEST, ABDOMEN AND PELVIS TECHNIQUE: Multidetector CT imaging through the chest, abdomen and pelvis was performed using the standard protocol during bolus administration of intravenous contrast. Multiplanar reconstructed images and MIPs were obtained and reviewed to evaluate the vascular anatomy. CONTRAST:  175mL ISOVUE-370 IOPAMIDOL (ISOVUE-370) INJECTION 76% COMPARISON:  Chest CTA 02/06/2015.  Abdominal CT 07/19/2017. FINDINGS: CTA CHEST FINDINGS Cardiovascular: Precontrast images through the chest demonstrate mild  aortic atherosclerosis, but no displaced intimal calcifications. There are mild coronary artery calcifications as well. Following contrast, the aortic lumen opacifies normally. There is no aneurysm or dissection. However, there is evidence of high-grade stenosis or short segment occlusion of the proximal left subclavian artery (image 18/7 and 103/11). This is proximal to the left vertebral artery which is patent. The pulmonary arteries are patent. The heart size is normal. There is no pericardial effusion. Mediastinum/Nodes: There are no enlarged mediastinal, hilar or axillary lymph nodes. There is a small hiatal hernia with mild distal esophageal wall thickening, similar to previous studies. Lungs/Pleura: There is no pleural effusion. Mild dependent atelectasis in both lungs. There is a stable small right lower lobe calcified granuloma. No suspicious pulmonary nodules. Musculoskeletal/Chest wall: There is no chest wall mass or suspicious osseous finding. Review of the MIP images confirms the above findings. CTA ABDOMEN AND PELVIS FINDINGS VASCULAR Aorta: Atherosclerosis without aneurysm or dissection. Celiac: Widely patent. SMA: Widely patent. Renals: Duplicated on the right. Mild atherosclerosis. No significant stenosis. IMA: Patent. Inflow: Bilateral common and external iliac atherosclerosis. No significant stenosis. Veins: Limited opacification.  No significant findings. Review of the MIP images confirms the above findings. NON-VASCULAR Hepatobiliary: Hepatic steatosis again noted. There is a stable small cyst in segment 5. No significant biliary dilatation status post cholecystectomy. Pancreas: Unremarkable. No pancreatic ductal dilatation or surrounding inflammatory changes. Spleen: Normal in size without focal abnormality. Adrenals/Urinary Tract: Both adrenal glands appear normal. No significant kidney findings. The previously demonstrated small renal cysts are not  well visualized. No evidence of urinary  tract calculus or hydronephrosis. The bladder appears normal. Stomach/Bowel: No evidence of bowel wall thickening, distention or surrounding inflammatory change. The appendix appears normal. Lymphatic: There are no enlarged abdominal or pelvic lymph nodes. Reproductive: The uterus and ovaries appear normal. No adnexal mass. Other: No evidence of abdominal wall mass or hernia. No ascites. Musculoskeletal: No acute or significant osseous findings. Stable bone islands in the pelvis. Review of the MIP images confirms the above findings. IMPRESSION: 1. No definite acute vascular findings in the chest, abdomen or pelvis. No evidence of aneurysm or dissection. 2. High-grade stenosis or focal short segment occlusion of the proximal left subclavian artery, proximal to the vertebral artery. This predisposes to subclavian steal syndrome. 3.  Aortic Atherosclerosis (ICD10-I70.0). 4. Hiatal hernia and distal esophageal wall thickening, similar to previous studies. 5. Hepatic steatosis. Electronically Signed   By: Richardean Sale M.D.   On: 02/21/2018 20:43    Assessment and Plan:   1. Chest pain/unstable angina. -Mixed symptoms.  She had tender chest wall however her chest tightness/pressure is different.  This occurs every few hours since admission resolved with sublingual nitroglycerin and/or or morphine. -Her cardiac risk factor includes hyperlipidemia, hypertension, ongoing tobacco smoking and family history of CAD. Troponin has been remained negative. ? DC heparin.  -Echo showed reassuring EF. Stress test medium defect of moderate severity present in the mid anterior, mid anteroseptal, apical anterior and apical septal location. Findings consistent with ischemia. - She will need cardiac cath for definite evaluation. Either today or Monday.   2. Left subclavian artery occlusion  - Follows with vascular surgery, planning for left carotid-to-subclavian bypass  once cardiac clearnace.  3.  Intractable nausea and  vomiting -Has a chronic GI issue.  Currently on IV Pepcid.  4. HLD - 01/17/2018: Cholesterol 272; HDL 42; Triglycerides 348 . LDL 171. - Continue lipitor 40mg  qd.   5. Hypokalemia - supplement per primary      For questions or updates, please contact [place] Please consult [email or web address] for contact info under Cardiology/STEMI.   Jarrett Soho, [place]  02/23/2018 2:36 PM

## 2018-02-23 NOTE — Progress Notes (Signed)
Initial Nutrition Assessment  DOCUMENTATION CODES:   Obesity unspecified  INTERVENTION:   When diet advances:  - RD to order Ensure Enlive po BID, each supplement provides 350kcals and 20g of protein. - RD to order MVI  NUTRITION DIAGNOSIS:   Inadequate oral intake related to inability to eat as evidenced by NPO status.  GOAL:   Patient will meet greater than or equal to 90% of their needs  MONITOR:   Diet advancement, Labs, I & O's, PO intake, Supplement acceptance  REASON FOR ASSESSMENT:   Malnutrition Screening Tool    ASSESSMENT:   52 y.o.female with PMH of GERD with Barrett esophagus and esophagitis, HTN, left subclavian stenosis followed by vascular surgery Chronic abdominal pain/ back pain, hiatal hernia, pancreatitis, and tobacco use. Presented to the ED with N/V following nuclear medicine stress test on 2/27 as part of preoperative cardiac evaluation for left carotid to subclavian bypass.   Pt reports less nausea without vomiting at time of visit this morning. Pt reports she has not eaten a meal since Tuesday evenening 2/26. Pt states she had many episodes of vomiting following a stress test Wednesday morning and called 911.   Pt states she wants to try an eat this morning. She was not able to consume 1/2 [place] sandwich last night, with significant nausea within the first few bites.   Pt explained that she is followed by a GI doctor and states she has a "lazy" stomach that empties slowly. She reports having 2-3 day "episodes" of vomiting every 3 months or which she attributes to this. She reports having a sore throat currently and PTA; she avoids tough cuts of meat due to throat pain. Pt states her pancreatitis has not been an issue and she does not drink any alcohol.  Diet hx: pt states she eats poorly at home. She will have a yeast roll or Ritz crackers with "squirt cheese" during the day and a "good" dinner. She reports baking all of her meats for dinner because  anything greasy/spicy causes her GERD to flare up. Pt admits she drinks Dr. Malachi Bonds "all day long." About 3x a week, she states she will not eat anything during the day and only drink Dr. Malachi Bonds.   Pt amenable to receiving Ensure when diet advances as well as MVI. Intern recommended drinking Ensure/Boost and taking MVI at home if intake is poor.   Pt reports a UBW of 170lb from 2 years ago. She states she gained some wt but lost 10lb in 2-3 days during her last "episode" of N/V in late January/early February. Per chart review, pt has lost 7lb (3.7%) since 10/31/2017 which is not significant. Wt hx outlined below.   Medications: Insulin, reglan, protonix, penicillin v potassium, famotidine.  Labs: 2/28 - K(L;3.1)  CBG (last 3)  Recent Labs    02/22/18 2054 02/23/18 0737 02/23/18 1124  GLUCAP 106* 132* 90     NUTRITION - FOCUSED PHYSICAL EXAM:  No muscle or fat depletions noted at this time.   Diet Order:  Diet NPO time specified  EDUCATION NEEDS:   Education needs have been addressed  Skin:  Skin Assessment: Reviewed RN Assessment  Last BM:  2/28  Height:   Ht Readings from Last 1 Encounters:  02/22/18 5\' 6"  (1.676 m)    Weight:   Wt Readings from Last 15 Encounters:  02/23/18 183 lb 11.2 oz (83.3 kg)  02/14/18 191 lb (86.6 kg)  02/08/18 183 lb (83 kg)  01/19/18 186 lb (84.4  kg)  01/18/18 183 lb (83 kg)  01/17/18 186 lb 4 oz (84.5 kg)  10/31/17 190 lb 3.2 oz (86.3 kg)  08/22/17 190 lb 9.6 oz (86.5 kg)  07/19/17 190 lb (86.2 kg)  06/19/17 190 lb (86.2 kg)  04/17/17 196 lb (88.9 kg)  04/14/17 196 lb 6.4 oz (89.1 kg)  03/21/17 195 lb 9.6 oz (88.7 kg)  12/31/16 185 lb 14.4 oz (84.3 kg)  12/21/16 180 lb (81.6 kg)     Ideal Body Weight:  59.1 kg  BMI:  Body mass index is 29.65 kg/m.  Estimated Nutritional Needs:   Kcal:  1700-1900  Protein:  90-100g  Fluid:  >1.7L   Dareon Nunziato, MS, Dietetic Intern Pager # [phone]

## 2018-02-23 NOTE — Progress Notes (Signed)
ANTICOAGULATION CONSULT NOTE  Pharmacy Consult for heparin Indication: chest pain/ACS  No Known Allergies  Patient Measurements: Height: 5\' 6"  (167.6 cm) Weight: 182 lb 12.8 oz (82.9 kg) IBW/kg (Calculated) : 59.3 Heparin Dosing Weight: 77 kg  Vital Signs: Temp: 98.6 F (37 C) (03/01 0040) Temp Source: Oral (03/01 0040) BP: 98/43 (03/01 0040) Pulse Rate: 56 (03/01 0040)  Labs: Recent Labs    02/21/18 1545  02/21/18 2307 02/22/18 0357 02/22/18 1432 02/23/18 0220  HGB 14.7  --   --  14.7  --   --   HCT 43.0  --   --  42.7  --   --   PLT 359  --   --  369  --   --   HEPARINUNFRC  --   --   --   --   --  <0.10*  CREATININE 0.95  --   --  0.74  --   --   TROPONINI <0.03   < > <0.03 <0.03 <0.03  --    < > = values in this interval not displayed.    Estimated Creatinine Clearance: 89.2 mL/min (by C-G formula based on SCr of 0.74 mg/dL).   Medical History: Past Medical History:  Diagnosis Date  . Arthritis    "qwhere" (02/22/2018)  . Barrett's esophagus with esophagitis 03/26/2013  . Chronic abdominal pain   . Chronic lower back pain   . Chronic pancreatitis ([place])   . COPD (chronic obstructive pulmonary disease) ([place])   . Depression   . GERD (gastroesophageal reflux disease)   . Heart murmur   . Hiatal hernia   . High cholesterol   . Hypertension   . Peptic ulcer   . Pneumonia 2000s X 1  . Tobacco use 03/26/2013    Assessment: Pt presenting with chest pain, nausea, and vomiting, with Positive nuclear med stress test. Vascular surgery was planning for Left carotid to subclavian bypass. Pharmacy consulted to start heparin. Not on anticoagulation PTA.   Initial heparin level undetectable, no bleeding or IV issues noted. Will rebolus and increase rate.  Goal of Therapy:  Heparin level 0.3-0.7 units/ml Monitor platelets by anticoagulation protocol: Yes   Plan:  Repeat heparin 3000 unit bolus x 1 Increase heparin infusion to 1200 units/hr Check anti-Xa level in  6 hours and daily while on heparin Continue to monitor H&H and platelets   Thank you for allowing [place] to participate in this patients care.  Erin Hearing PharmD., BCPS Clinical Pharmacist 02/23/2018 3:38 AM

## 2018-02-23 NOTE — Progress Notes (Signed)
PROGRESS NOTE    Rita [name]  UUV:[id number] DOB: 05/30/1966 DOA: 02/21/2018 PCP: Caren Macadam, MD   Brief Narrative:  52 y.o. WF PMHx GERD with Barrett esophagus and esophagitis, HTN, Heart murmur, LEFT subclavian stenosis followed by vascular surgery Chronic abdominal pain/ back pain, Hiatal Hernia, Pancreatitis,  Tobacco use  Presenting to the chest pain, nausea, and vomiting.  Patient reports that she has chronic recurrent episodes of nausea with vomiting that she is usually able to control with antiemetics at home.  Today, she underwent a nuclear medicine stress test as part of preoperative cardiac evaluation (preparing for left carotid to subclavian bypass), and developed chest pain with nausea during the test.  She reports vomiting once during the stress test, but then went on to have many episodes of nonbloody vomiting afterwards.  She has been unable to control her symptoms with home medications.  She also reports a chest pain, localized to an area just left of center, constant, waxing and waning in severity and associated with nausea and mild dyspnea.  She denies abdominal pain or diarrhea.   ED Course: Upon arrival to the ED, patient is found to be afebrile, saturating well on room air, with vitals otherwise normal.  EKG features a sinus rhythm with right bundle branch block and no significant change from prior.  Chest x-ray notable for borderline cardiomegaly and pulmonary vascular congestion.  Chemistry panel features a potassium of 3.0, bicarbonate of 15, and anion gap of 20.  CBC is notable for leukocytosis to 12,600.  Troponin is undetectable.  Acid is reassuring at 1.52.  CTA chest/abdomen/pelvis is negative for acute vascular finding, but notable for the known occlusion of the proximal left subclavian.  Patient was treated with 2 L of normal saline, 30 mEq of potassium, morphine, Zofran, Phenergan, Reglan, and Ativan in the ED.  She remains hemodynamically stable, but continues to  have intractable nausea with nonbloody vomiting as well as persistent chest pain, and will be observed in the stepdown unit for ongoing evaluation and management of this.    Subjective: 3/1 A/O 4, negative SOB. Positive reproducible substernal CP. Negative N/V. States overnight positive sharp CP with positive radiation to LEFT jaw, did not report to nursing staff. Currently asymptomatic.    Assessment & Plan:   Principal Problem:   Intractable nausea and vomiting Active Problems:   Hypokalemia   Hiatal hernia with gastroesophageal reflux disease and esophagitis   CAD (coronary artery disease)   Chest pain   Left subclavian artery occlusion   Abnormal (Positive) nuclear med stress test -Intermediate risk study with defective moderate severity consistent with ischemia. See results below -Echocardiogram: WNL see results below. -Heparin drip per pharmacy -Discussed case with cardiology will not be able to perform cardiac catheterization today. Will evaluate patient and most likely perform cardiac catheterization on 4 March Monday.  Chest pain/Unstable Angina   -Multifactorial hiatal hernia, cardiac? -Continued Reproducible sternal chest pain most likely secondary to N/V -Patient with CP overnight unprovoked consistent with ischemia. Sharp chest pain with radiation to left jaw. Patient with multiple risk factors: Smoker, family history cardiac disease, recurrent occluded subclavian artery.Marland Kitchen -CTA chest/abdomen/pelvis negative for overt vascular process. See results below -Serial cardiac enzymes negative Recent Labs  Lab 02/21/18 1545 02/21/18 2040 02/21/18 2307 02/22/18 0357 02/22/18 1432  TROPONINI <0.03 <0.03 <0.03 <0.03 <0.03  -ASA 81 mg -BP currently soft hold on starting beta blocker or  ACEI -Patient counseled that she must report chest pain that radiates to her  jaw to staff. NTG PRN -See abnormal nuclear stress test  LEFT subclavian artery occlusion -Vascular surgery  planning for LEFT carotid to subclavian bypass  Intractable nausea and vomiting -Patient has chronic nausea with frequent vomiting usually controlled with antiemetics at home. Most likely secondary to hiatal hernia. -Resolved -Pepcid 20 mg BID -Reglan 10 mg TID -Protonix 40 mg daily -Phenergan PRN   GERD/hiatal hernia   - Hx of hiatal hernia and Barrett's with esophagitis  - Takes daily PPI, will continue, IV Pepcid added given intractable N/V   -May require GI consult   Hypokalemia -Potassium IV 50 mEq  HLD -Lipitor 40 mg daily  Tobacco abuse -Counseled at length on absolute requirement to discontinued use.    DVT prophylaxis: Heparin Code Status: Full Family Communication: Family at bedside for discussion of plan care Disposition Plan: TBD   Consultants:  Cardiology     Procedures/Significant Events:   -2/21 Echocardiogram:Left ventricle: The cavity size was normal. Wall thickness was   normal. Systolic function was normal. The estimated ejection   fraction was 60%. Wall motion was normal; there were no regional   wall motion abnormalities. Left ventricular diastolic function   parameters were normal. - Mitral valve: There was mild regurgitation. - Tricuspid valve: There was mild regurgitation. ------------------------------------------------------------------------------------------------------------------------------------------ 2/27 Nuclear medicine stress test:-Negative ST segment deviation noted during stress. -Medium defect of moderate severity present in the mid anterior, mid anteroseptal, apical anterior and apical septal location. Findings consistent with ischemia. -This is an intermediate risk study. -LVEF = 62%. 2/27 CT angiogram chest/abdomen/pelvis:No definite acute vascular findings in the chest, abdomen or pelvis. No evidence of aneurysm or dissection. -High-grade stenosis or focal short segment occlusion of the proximal left subclavian artery,  proximal to the vertebral artery.This predisposes to subclavian steal syndrome. -Hiatal hernia and distal esophageal wall thickening, similar to previous studies. -Hepatic steatosis.     I have personally reviewed and interpreted all radiology studies and my findings are as above.  VENTILATOR SETTINGS:    Cultures   Antimicrobials:    Devices    LINES / TUBES:      Continuous Infusions: . famotidine (PEPCID) IV 20 mg (02/23/18 1059)  . heparin 1,200 Units/hr (02/23/18 0400)     Objective: Vitals:   02/23/18 0800 02/23/18 0853 02/23/18 1110 02/23/18 1118  BP: 123/63  121/67 104/67  Pulse:      Resp:      Temp:  98.8 F (37.1 C)    TempSrc:  Oral    SpO2:  98%    Weight:      Height:        Intake/Output Summary (Last 24 hours) at 02/23/2018 1218 Last data filed at 02/23/2018 1000 Gross per 24 hour  Intake 753.55 ml  Output -  Net 753.55 ml   Filed Weights   02/21/18 1545 02/22/18 1614 02/23/18 0500  Weight: 191 lb (86.6 kg) 182 lb 12.8 oz (82.9 kg) 183 lb 11.2 oz (83.3 kg)    Physical Exam:  General: A/O 4,No acute respiratory distress Neck:  Negative scars, masses, torticollis, lymphadenopathy, JVD Lungs: Clear to auscultation bilaterally without wheezes or crackles Cardiovascular: Regular rate and rhythm without murmur gallop or rub normal S1 and S2. Positive pain to palpation substernal Abdomen: negative abdominal pain, nondistended, positive soft, bowel sounds, no rebound, no ascites, no appreciable mass Extremities: No significant cyanosis, clubbing, or edema bilateral lower extremities Skin: Negative rashes, lesions, ulcers Psychiatric:  Negative depression, negative anxiety, negative fatigue, negative mania  Central nervous system:  Cranial nerves II through XII intact, tongue/uvula midline, all extremities muscle strength 5/5, sensation intact throughout,negative dysarthria, negative expressive aphasia, negative receptive aphasia.  .      Data Reviewed: Care during the described time interval was provided by me .  I have reviewed this patient's available data, including medical history, events of note, physical examination, and all test results as part of my evaluation.   CBC: Recent Labs  Lab 02/21/18 1545 02/22/18 0357 02/23/18 0936  WBC 12.6* 13.9* 8.0  NEUTROABS 10.8*  --   --   HGB 14.7 14.7 12.1  HCT 43.0 42.7 37.0  MCV 87.8 86.1 90.5  PLT 359 369 784   Basic Metabolic Panel: Recent Labs  Lab 02/21/18 1545 02/22/18 0357 02/22/18 1432  NA 138 138  --   K 3.0* 2.6* 3.1*  CL 103 101  --   CO2 15* 24  --   GLUCOSE 150* 123*  --   BUN 11 5*  --   CREATININE 0.95 0.74  --   CALCIUM 9.1 8.3*  --   MG  --  2.2 2.1   GFR: Estimated Creatinine Clearance: 89.5 mL/min (by C-G formula based on SCr of 0.74 mg/dL). Liver Function Tests: Recent Labs  Lab 02/21/18 1545  AST 28  ALT 19  ALKPHOS 72  BILITOT 0.8  PROT 7.5  ALBUMIN 4.2   Recent Labs  Lab 02/21/18 1545  LIPASE 33   No results for input(s): AMMONIA in the last 168 hours. Coagulation Profile: No results for input(s): INR, PROTIME in the last 168 hours. Cardiac Enzymes: Recent Labs  Lab 02/21/18 1545 02/21/18 2040 02/21/18 2307 02/22/18 0357 02/22/18 1432  TROPONINI <0.03 <0.03 <0.03 <0.03 <0.03   BNP (last 3 results) No results for input(s): PROBNP in the last 8760 hours. HbA1C: No results for input(s): HGBA1C in the last 72 hours. CBG: Recent Labs  Lab 02/22/18 1136 02/22/18 1612 02/22/18 2054 02/23/18 0737 02/23/18 1124  GLUCAP 92 102* 106* 132* 90   Lipid Profile: No results for input(s): CHOL, HDL, LDLCALC, TRIG, CHOLHDL, LDLDIRECT in the last 72 hours. Thyroid Function Tests: No results for input(s): TSH, T4TOTAL, FREET4, T3FREE, THYROIDAB in the last 72 hours. Anemia Panel: No results for input(s): VITAMINB12, FOLATE, FERRITIN, TIBC, IRON, RETICCTPCT in the last 72 hours. Urine analysis:    Component  Value Date/Time   COLORURINE YELLOW 02/22/2018 0803   APPEARANCEUR CLEAR 02/22/2018 0803   LABSPEC 1.017 02/22/2018 0803   PHURINE 6.0 02/22/2018 0803   GLUCOSEU NEGATIVE 02/22/2018 0803   HGBUR SMALL (A) 02/22/2018 0803   BILIRUBINUR NEGATIVE 02/22/2018 0803   KETONESUR NEGATIVE 02/22/2018 0803   PROTEINUR NEGATIVE 02/22/2018 0803   UROBILINOGEN 0.2 02/05/2015 2347   NITRITE NEGATIVE 02/22/2018 0803   LEUKOCYTESUR NEGATIVE 02/22/2018 0803   Sepsis Labs: @LABRCNTIP (procalcitonin:4,lacticidven:4)  ) Recent Results (from the past 240 hour(s))  Urine Culture     Status: Abnormal   Collection Time: 02/22/18  8:04 AM  Result Value Ref Range Status   Specimen Description URINE, RANDOM  Final   Special Requests NONE  Final   Culture (A)  Final    <10,000 COLONIES/mL INSIGNIFICANT GROWTH Performed at [facility], [place] [address], [place], [place] [address]    Report Status 02/23/2018 FINAL  Final  Culture, blood (routine x 2)     Status: None (Preliminary result)   Collection Time: 02/22/18  2:50 PM  Result Value Ref Range Status   Specimen  Description BLOOD LEFT ARM  Final   Special Requests IN PEDIATRIC BOTTLE Blood Culture adequate volume  Final   Culture   Final    NO GROWTH < 24 HOURS Performed at [facility], [address] [place], [place] [address]    Report Status PENDING  Incomplete  Culture, blood (routine x 2)     Status: None (Preliminary result)   Collection Time: 02/22/18  2:55 PM  Result Value Ref Range Status   Specimen Description BLOOD LEFT HAND  Final   Special Requests IN PEDIATRIC BOTTLE Blood Culture adequate volume  Final   Culture   Final    NO GROWTH < 24 HOURS Performed at [facility], [place] [address], [place], [place] [address]    Report Status PENDING  Incomplete         Radiology Studies: Dg Chest 2 View  Result Date: 02/21/2018 CLINICAL DATA:  Left carotid occlusion.  Chest pain. EXAM: CHEST  2 VIEW COMPARISON:   Two-view chest x-ray 12/30/2016. FINDINGS: Heart is enlarged. Mild pulmonary vascular congestion is present without frank edema. There are no effusions. The visualized soft tissues and bony thorax are unremarkable. IMPRESSION: Borderline cardiomegaly and mild pulmonary vascular congestion. Electronically Signed   By: San Morelle M.D.   On: 02/21/2018 16:34   Ct Angio Chest/abd/pel For Dissection W And/or Wo Contrast  Result Date: 02/21/2018 CLINICAL DATA:  Acute onset of extreme chest pain today. Current smoker. Pancreatitis. EXAM: CT ANGIOGRAPHY CHEST, ABDOMEN AND PELVIS TECHNIQUE: Multidetector CT imaging through the chest, abdomen and pelvis was performed using the standard protocol during bolus administration of intravenous contrast. Multiplanar reconstructed images and MIPs were obtained and reviewed to evaluate the vascular anatomy. CONTRAST:  124mL ISOVUE-370 IOPAMIDOL (ISOVUE-370) INJECTION 76% COMPARISON:  Chest CTA 02/06/2015.  Abdominal CT 07/19/2017. FINDINGS: CTA CHEST FINDINGS Cardiovascular: Precontrast images through the chest demonstrate mild aortic atherosclerosis, but no displaced intimal calcifications. There are mild coronary artery calcifications as well. Following contrast, the aortic lumen opacifies normally. There is no aneurysm or dissection. However, there is evidence of high-grade stenosis or short segment occlusion of the proximal left subclavian artery (image 18/7 and 103/11). This is proximal to the left vertebral artery which is patent. The pulmonary arteries are patent. The heart size is normal. There is no pericardial effusion. Mediastinum/Nodes: There are no enlarged mediastinal, hilar or axillary lymph nodes. There is a small hiatal hernia with mild distal esophageal wall thickening, similar to previous studies. Lungs/Pleura: There is no pleural effusion. Mild dependent atelectasis in both lungs. There is a stable small right lower lobe calcified granuloma. No  suspicious pulmonary nodules. Musculoskeletal/Chest wall: There is no chest wall mass or suspicious osseous finding. Review of the MIP images confirms the above findings. CTA ABDOMEN AND PELVIS FINDINGS VASCULAR Aorta: Atherosclerosis without aneurysm or dissection. Celiac: Widely patent. SMA: Widely patent. Renals: Duplicated on the right. Mild atherosclerosis. No significant stenosis. IMA: Patent. Inflow: Bilateral common and external iliac atherosclerosis. No significant stenosis. Veins: Limited opacification.  No significant findings. Review of the MIP images confirms the above findings. NON-VASCULAR Hepatobiliary: Hepatic steatosis again noted. There is a stable small cyst in segment 5. No significant biliary dilatation status post cholecystectomy. Pancreas: Unremarkable. No pancreatic ductal dilatation or surrounding inflammatory changes. Spleen: Normal in size without focal abnormality. Adrenals/Urinary Tract: Both adrenal glands appear normal. No significant kidney findings. The previously demonstrated small renal cysts are not well visualized. No evidence of urinary tract calculus or hydronephrosis.  The bladder appears normal. Stomach/Bowel: No evidence of bowel wall thickening, distention or surrounding inflammatory change. The appendix appears normal. Lymphatic: There are no enlarged abdominal or pelvic lymph nodes. Reproductive: The uterus and ovaries appear normal. No adnexal mass. Other: No evidence of abdominal wall mass or hernia. No ascites. Musculoskeletal: No acute or significant osseous findings. Stable bone islands in the pelvis. Review of the MIP images confirms the above findings. IMPRESSION: 1. No definite acute vascular findings in the chest, abdomen or pelvis. No evidence of aneurysm or dissection. 2. High-grade stenosis or focal short segment occlusion of the proximal left subclavian artery, proximal to the vertebral artery. This predisposes to subclavian steal syndrome. 3.  Aortic  Atherosclerosis (ICD10-I70.0). 4. Hiatal hernia and distal esophageal wall thickening, similar to previous studies. 5. Hepatic steatosis. Electronically Signed   By: Richardean Sale M.D.   On: 02/21/2018 20:43        Scheduled Meds: . amLODipine  5 mg Oral Daily  . aspirin EC  81 mg Oral Daily  . atorvastatin  40 mg Oral q1800  . escitalopram  10 mg Oral Daily  . insulin aspart  0-5 Units Subcutaneous QHS  . insulin aspart  0-9 Units Subcutaneous TID WC  . metoCLOPramide (REGLAN) injection  10 mg Intravenous Q8H  . pantoprazole  40 mg Oral Daily  . penicillin v potassium  500 mg Oral BID  . traZODone  50 mg Oral QHS   Continuous Infusions: . famotidine (PEPCID) IV 20 mg (02/23/18 1059)  . heparin 1,200 Units/hr (02/23/18 0400)     LOS: 1 day    Time spent: 40 minutes    Jamil Castillo, Geraldo Docker, MD [facility] Pager [phone]   If 7PM-7AM, please contact night-coverage [email or web address] Password [facility] 02/23/2018, 12:18 PM

## 2018-02-24 DIAGNOSIS — E876 Hypokalemia: Secondary | ICD-10-CM

## 2018-02-24 DIAGNOSIS — I2510 Atherosclerotic heart disease of native coronary artery without angina pectoris: Secondary | ICD-10-CM

## 2018-02-24 DIAGNOSIS — G43A1 Cyclical vomiting, intractable: Secondary | ICD-10-CM

## 2018-02-24 LAB — CBC
HEMATOCRIT: 35 % — AB (ref 36.0–46.0)
HEMOGLOBIN: 11.3 g/dL — AB (ref 12.0–15.0)
MCH: 28.6 pg (ref 26.0–34.0)
MCHC: 32.3 g/dL (ref 30.0–36.0)
MCV: 88.6 fL (ref 78.0–100.0)
PLATELETS: 351 10*3/uL (ref 150–400)
RBC: 3.95 MIL/uL (ref 3.87–5.11)
RDW: 14.1 % (ref 11.5–15.5)
WBC: 8.8 10*3/uL (ref 4.0–10.5)

## 2018-02-24 LAB — GLUCOSE, CAPILLARY
GLUCOSE-CAPILLARY: 85 mg/dL (ref 65–99)
GLUCOSE-CAPILLARY: 106 mg/dL — AB (ref 65–99)
Glucose-Capillary: 91 mg/dL (ref 65–99)
Glucose-Capillary: 102 mg/dL — ABNORMAL HIGH (ref 65–99)

## 2018-02-24 LAB — HEPARIN LEVEL (UNFRACTIONATED)
HEPARIN UNFRACTIONATED: 0.47 [IU]/mL (ref 0.30–0.70)
Heparin Unfractionated: 0.2 IU/mL — ABNORMAL LOW (ref 0.30–0.70)
Heparin Unfractionated: 0.21 IU/mL — ABNORMAL LOW (ref 0.30–0.70)

## 2018-02-24 MED ORDER — NITROGLYCERIN 2 % TD OINT
1.0000 [in_us] | TOPICAL_OINTMENT | Freq: Three times a day (TID) | TRANSDERMAL | Status: DC
  Administered 2018-02-24 – 2018-02-26 (×5): 1 [in_us] via TOPICAL
  Filled 2018-02-24: qty 30

## 2018-02-24 MED ORDER — METOPROLOL TARTRATE 12.5 MG HALF TABLET
12.5000 mg | ORAL_TABLET | Freq: Two times a day (BID) | ORAL | Status: DC
  Administered 2018-02-24 – 2018-02-26 (×4): 12.5 mg via ORAL
  Filled 2018-02-24 (×4): qty 1

## 2018-02-24 MED ORDER — ACETAMINOPHEN 325 MG PO TABS: 650.0000 mg | ORAL_TABLET | ORAL | Status: DC | PRN

## 2018-02-24 MED ORDER — MORPHINE SULFATE (PF) 4 MG/ML IV SOLN
2.0000 mg | INTRAVENOUS | Status: DC | PRN
  Administered 2018-02-24 – 2018-02-26 (×10): 4 mg via INTRAVENOUS
  Filled 2018-02-24 (×10): qty 1

## 2018-02-24 MED ORDER — POTASSIUM CHLORIDE CRYS ER 20 MEQ PO TBCR
40.0000 meq | EXTENDED_RELEASE_TABLET | Freq: Once | ORAL | Status: AC
  Administered 2018-02-24: 40 meq via ORAL
  Filled 2018-02-24: qty 2

## 2018-02-24 MED ORDER — NITROGLYCERIN 2 % TD OINT
0.5000 [in_us] | TOPICAL_OINTMENT | Freq: Three times a day (TID) | TRANSDERMAL | Status: DC
  Administered 2018-02-24: 0.5 [in_us] via TOPICAL
  Filled 2018-02-24: qty 30

## 2018-02-24 MED ORDER — HEPARIN BOLUS VIA INFUSION
1100.0000 [IU] | Freq: Once | INTRAVENOUS | Status: AC
  Administered 2018-02-24: 1100 [IU] via INTRAVENOUS
  Filled 2018-02-24: qty 1100

## 2018-02-24 MED ORDER — OXYCODONE HCL 5 MG PO TABS
5.0000 mg | ORAL_TABLET | ORAL | Status: DC | PRN
  Administered 2018-02-24 – 2018-02-26 (×10): 10 mg via ORAL
  Filled 2018-02-24 (×10): qty 2

## 2018-02-24 MED ORDER — FAMOTIDINE 20 MG PO TABS
20.0000 mg | ORAL_TABLET | Freq: Two times a day (BID) | ORAL | Status: DC
  Administered 2018-02-24 – 2018-02-26 (×4): 20 mg via ORAL
  Filled 2018-02-24 (×4): qty 1

## 2018-02-24 NOTE — Plan of Care (Signed)
  Clinical Measurements: Cardiovascular complication will be avoided 02/24/2018 2015 - Progressing by Irish Lack, RN   Pain Managment: General experience of comfort will improve 02/24/2018 2015 - Progressing by Irish Lack, RN

## 2018-02-24 NOTE — Progress Notes (Signed)
Subjective:  Having some intermittent chest pain overnight.  Currently on IV heparin.  Objective:  Vital Signs in the last 24 hours: BP 132/77 (BP Location: Right Arm)   Pulse (!) 56   Temp 98.2 F (36.8 C) (Oral)   Resp 18   Ht 5\' 6"  (1.676 m)   Wt 82.9 kg (182 lb 12.8 oz)   LMP 02/09/2012   SpO2 99%   BMI 29.50 kg/m   Physical Exam: Pleasant obese female in no acute distress Lungs:  Clear Cardiac:  Regular rhythm, normal S1 and S2, no S3 Extremities:  No edema present  Intake/Output from previous day: 03/01 0701 - 03/02 0700 In: 820 [P.O.:480; I.V.:240; IV Piggyback:100] Out: -   Weight Filed Weights   02/22/18 1614 02/23/18 0500 02/24/18 0543  Weight: 82.9 kg (182 lb 12.8 oz) 83.3 kg (183 lb 11.2 oz) 82.9 kg (182 lb 12.8 oz)    Lab Results: Basic Metabolic Panel: Recent Labs    02/22/18 0357 02/22/18 1432 02/23/18 1150  NA 138  --  137  K 2.6* 3.1* 3.2*  CL 101  --  107  CO2 24  --  19*  GLUCOSE 123*  --  83  BUN 5*  --  11  CREATININE 0.74  --  0.86   CBC: Recent Labs    02/21/18 1545  02/23/18 0936 02/24/18 0219  WBC 12.6*   < > 8.0 8.8  NEUTROABS 10.8*  --   --   --   HGB 14.7   < > 12.1 11.3*  HCT 43.0   < > 37.0 35.0*  MCV 87.8   < > 90.5 88.6  PLT 359   < > 330 351   < > = values in this interval not displayed.   Cardiac Panel (last 3 results) Recent Labs    02/21/18 2307 02/22/18 0357 02/22/18 1432  TROPONINI <0.03 <0.03 <0.03    Telemetry: Sinus rhythm   Assessment/Plan:  1.  Peripheral vascular disease with need for left carotid to subclavian bypass 2.  Chest pain with nausea and vomiting abnormal nuclear stress test  Recommendations:  Plans are for catheterization on Monday.  We'll replace potassium.     Kerry Hough  MD [facility]  02/24/2018, 12:40 PM

## 2018-02-24 NOTE — Progress Notes (Signed)
ANTICOAGULATION CONSULT NOTE - Follow Up Consult  Pharmacy Consult for heparin Indication: chest pain/ACS  No Known Allergies  Patient Measurements: Height: 5\' 6"  (167.6 cm) Weight: 182 lb 12.8 oz (82.9 kg) IBW/kg (Calculated) : 59.3 Heparin Dosing Weight: 77 kg   Vital Signs: Temp: 98.2 F (36.8 C) (03/02 1214) Temp Source: Oral (03/02 1214) BP: 132/77 (03/02 1214) Pulse Rate: 56 (03/02 0543)  Labs: Recent Labs    02/21/18 1545  02/21/18 2307 02/22/18 0357 02/22/18 1432  02/23/18 0936 02/23/18 1150 02/24/18 0219 02/24/18 1130  HGB 14.7  --   --  14.7  --   --  12.1  --  11.3*  --   HCT 43.0  --   --  42.7  --   --  37.0  --  35.0*  --   PLT 359  --   --  369  --   --  330  --  351  --   HEPARINUNFRC  --   --   --   --   --    < > 0.36  --  0.20* 0.21*  CREATININE 0.95  --   --  0.74  --   --   --  0.86  --   --   TROPONINI <0.03   < > <0.03 <0.03 <0.03  --   --   --   --   --    < > = values in this interval not displayed.    Estimated Creatinine Clearance: 83 mL/min (by C-G formula based on SCr of 0.86 mg/dL).   Medications:  Scheduled:  . amLODipine  5 mg Oral Daily  . aspirin EC  81 mg Oral Daily  . atorvastatin  40 mg Oral q1800  . escitalopram  10 mg Oral Daily  . feeding supplement (ENSURE ENLIVE)  237 mL Oral BID BM  . heparin  1,100 Units Intravenous Once  . insulin aspart  0-5 Units Subcutaneous QHS  . insulin aspart  0-9 Units Subcutaneous TID WC  . metoCLOPramide (REGLAN) injection  10 mg Intravenous Q8H  . multivitamin with minerals  1 tablet Oral Daily  . nitroGLYCERIN  0.5 inch Topical Q8H  . pantoprazole  40 mg Oral Daily  . penicillin v potassium  500 mg Oral BID  . traZODone  50 mg Oral QHS   Infusions:  . famotidine (PEPCID) IV Stopped (02/24/18 0854)  . heparin 1,350 Units/hr (02/24/18 8099)    Assessment: 72 YOF presented with chest pain, N/V and positive nuclear stress test with known L subclavian occlusion. Cardiology planning  for cath Mon 3/4 and [facility] planning L carotid subclavian bypass next week. Patient is not on anticoag PTA, pharmacy to dose heparin.  Heparin level is 0.21, up from 0.20 early this morning after rate increase. Per RN there are no issues with the infusion. HGB down slightly 12.1 > 11.3, platelets stable, no bleeding noted.  Goal of Therapy:  Heparin level 0.3-0.7 units/ml Monitor platelets by anticoagulation protocol: Yes   Plan:  Heparin bolus 1100 units x 1 Increase heparin to 1500 units/hr Check 6-hour heparin level Daily heparin level, CBC, monitor for s/sx of bleeding F/u cardiology and VVS plans post-cath 3/4    Charlene Brooke, PharmD PGY1 Pharmacy Resident Phone: [phone] After 3:30PM please call Main Pharmacy [phone] 02/24/2018,12:46 PM

## 2018-02-24 NOTE — Progress Notes (Signed)
[place] TEAM 1 - Stepdown/ICU TEAM  Rita [name]  WUJ:[id number] DOB: 1966/04/02 DOA: 02/21/2018 PCP: Caren Macadam, MD    Brief Narrative:  52 y.o.F w/ a Hx of GERD with Barrett esophagus and esophagitis, HTN, Heart murmur, L subclavian stenosis, Chronic abdominal/back pain, Hiatal Hernia, Pancreatitis, and Tobacco use who presented w/ chest pain, nausea, and vomiting.  She underwent a nuclear med stress test as part of a preoperative cardiac evaluation (preparing for left carotid to subclavian bypass), and developed chest pain with nausea during the test. She vomiting once during the stress test, and went on to have many episodes of nonbloody vomiting afterwards.   In the ED EKG featured a sinus rhythm with right bundle branch block and no significant change from prior. Troponin was undetectable. CTA chest/abdomen/pelvis was negative for acute vascular findings, but notable for the known occlusion of the proximal left subclavian. Her chest pain persisted.    Significant Events: 2/27 admit  Subjective: C/o ongoing uncontrolled chest pain.  Denies sob, n/v, or abdom pain.  Is very bored and anxious to get outside.    Assessment & Plan:   Abnormal nuclear med stress test - chest pain  -Intermediate risk study consistent with ischemia -Echocardiogram: WNL  -Heparin drip per pharmacy -for cardiac cath Monday  -CTA chest/abdomen/pelvis negative for overt vascular process -Serial cardiac enzymes negative -ASA 81 mg -BP improved therefore begin low dose BB  LEFT subclavian artery occlusion -Vascular Surgery planning for LEFT carotid to subclavian bypass after cardiac w/u   Intractable nausea and vomiting - GERD/Hiatal hernia  -Patient has chronic nausea with frequent vomiting usually controlled with antiemetics at home, likely secondary to hiatal hernia. -Hx of hiatal hernia and Barrett's with esophagitis  Hypokalemia - supplement and follow   HLD -Lipitor 40 mg  daily  Tobacco abuse -Counseled at length on absolute requirement to discontinue use  DVT prophylaxis: heparin gtt Code Status: FULL CODE Family Communication: spoke w/ pt and family at bedside   Disposition Plan: awaiting cardiac cath   Consultants:  Cardiology   Antimicrobials:  PCN V > 3/2  Objective: Blood pressure 132/77, pulse (!) 56, temperature 98.2 F (36.8 C), temperature source Oral, resp. rate 18, height 5\' 6"  (1.676 m), weight 82.9 kg (182 lb 12.8 oz), last menstrual period 02/09/2012, SpO2 99 %.  Intake/Output Summary (Last 24 hours) at 02/24/2018 1504 Last data filed at 02/24/2018 0000 Gross per 24 hour  Intake 748 ml  Output -  Net 748 ml   Filed Weights   02/22/18 1614 02/23/18 0500 02/24/18 0543  Weight: 82.9 kg (182 lb 12.8 oz) 83.3 kg (183 lb 11.2 oz) 82.9 kg (182 lb 12.8 oz)    Examination: General: No acute respiratory distress Lungs: Clear to auscultation bilaterally without wheezes or crackles Cardiovascular: Regular rate and rhythm without murmur gallop or rub normal S1 and S2 Abdomen: Nontender, nondistended, soft, bowel sounds positive, no rebound, no ascites, no appreciable mass Extremities: No significant cyanosis, clubbing, or edema bilateral lower extremities  CBC: Recent Labs  Lab 02/21/18 1545 02/22/18 0357 02/23/18 0936 02/24/18 0219  WBC 12.6* 13.9* 8.0 8.8  NEUTROABS 10.8*  --   --   --   HGB 14.7 14.7 12.1 11.3*  HCT 43.0 42.7 37.0 35.0*  MCV 87.8 86.1 90.5 88.6  PLT 359 369 330 956   Basic Metabolic Panel: Recent Labs  Lab 02/21/18 1545 02/22/18 0357 02/22/18 1432 02/23/18 1150  NA 138 138  --  137  K  3.0* 2.6* 3.1* 3.2*  CL 103 101  --  107  CO2 15* 24  --  19*  GLUCOSE 150* 123*  --  83  BUN 11 5*  --  11  CREATININE 0.95 0.74  --  0.86  CALCIUM 9.1 8.3*  --  8.2*  MG  --  2.2 2.1 2.1   GFR: Estimated Creatinine Clearance: 83 mL/[name] (by C-G formula based on SCr of 0.86 mg/dL).  Liver Function Tests: Recent  Labs  Lab 02/21/18 1545  AST 28  ALT 19  ALKPHOS 72  BILITOT 0.8  PROT 7.5  ALBUMIN 4.2   Recent Labs  Lab 02/21/18 1545  LIPASE 33    Cardiac Enzymes: Recent Labs  Lab 02/21/18 1545 02/21/18 2040 02/21/18 2307 02/22/18 0357 02/22/18 1432  TROPONINI <0.03 <0.03 <0.03 <0.03 <0.03    HbA1C: Hgb A1c MFr Bld  Date/Time Value Ref Range Status  01/17/2018 11:53 AM 5.4 <5.7 % of total Hgb Final    Comment:    For the purpose of screening for the presence of diabetes: . <5.7%       Consistent with the absence of diabetes 5.7-6.4%    Consistent with increased risk for diabetes             (prediabetes) > or =6.5%  Consistent with diabetes . This assay result is consistent with a decreased risk of diabetes. . Currently, no consensus exists regarding use of hemoglobin A1c for diagnosis of diabetes in children. . According to American Diabetes Association (ADA) guidelines, hemoglobin A1c <7.0% represents optimal control in non-pregnant diabetic patients. Different metrics may apply to specific patient populations.  Standards of Medical Care in Diabetes(ADA). .     CBG: Recent Labs  Lab 02/23/18 0737 02/23/18 1124 02/23/18 2108 02/24/18 0726 02/24/18 1146  GLUCAP 132* 90 88 91 85    Recent Results (from the past 240 hour(s))  Urine Culture     Status: Abnormal   Collection Time: 02/22/18  8:04 AM  Result Value Ref Range Status   Specimen Description URINE, RANDOM  Final   Special Requests NONE  Final   Culture (A)  Final    <10,000 COLONIES/mL INSIGNIFICANT GROWTH Performed at [facility], [place] [address], [place], [place] [address]    Report Status 02/23/2018 FINAL  Final  Culture, blood (routine x 2)     Status: None (Preliminary result)   Collection Time: 02/22/18  2:50 PM  Result Value Ref Range Status   Specimen Description BLOOD LEFT ARM  Final   Special Requests IN PEDIATRIC BOTTLE Blood Culture adequate volume  Final   Culture   Final     NO GROWTH 2 DAYS Performed at [facility], [place] [address] [place], [place] [address]    Report Status PENDING  Incomplete  Culture, blood (routine x 2)     Status: None (Preliminary result)   Collection Time: 02/22/18  2:55 PM  Result Value Ref Range Status   Specimen Description BLOOD LEFT HAND  Final   Special Requests IN PEDIATRIC BOTTLE Blood Culture adequate volume  Final   Culture   Final    NO GROWTH 2 DAYS Performed at [facility], [place] [address], [place],  [address]    Report Status PENDING  Incomplete     Scheduled Meds: . amLODipine  5 mg Oral Daily  . aspirin EC  81 mg Oral Daily  . atorvastatin  40 mg Oral q1800  .  escitalopram  10 mg Oral Daily  . feeding supplement (ENSURE ENLIVE)  237 mL Oral BID BM  . insulin aspart  0-5 Units Subcutaneous QHS  . insulin aspart  0-9 Units Subcutaneous TID WC  . metoCLOPramide (REGLAN) injection  10 mg Intravenous Q8H  . multivitamin with minerals  1 tablet Oral Daily  . nitroGLYCERIN  0.5 inch Topical Q8H  . pantoprazole  40 mg Oral Daily  . penicillin v potassium  500 mg Oral BID  . traZODone  50 mg Oral QHS   Continuous Infusions: . famotidine (PEPCID) IV Stopped (02/24/18 0854)  . heparin 1,500 Units/hr (02/24/18 1249)     LOS: 2 days   Cherene Altes, MD [facility] Office  [phone] Pager - Text Page per Amion as per below:  On-Call/Text Page:      [email or web address]      password [email or web address]  If 7PM-7AM, please contact night-coverage [email or web address] Password [facility] 02/24/2018, 3:04 PM

## 2018-02-24 NOTE — Progress Notes (Signed)
ANTICOAGULATION CONSULT NOTE - Follow Up Consult  Pharmacy Consult for heparin Indication: chest pain/ACS  No Known Allergies  Patient Measurements: Height: 5\' 6"  (167.6 cm) Weight: 182 lb 12.8 oz (82.9 kg) IBW/kg (Calculated) : 59.3 Heparin Dosing Weight: 77 kg   Vital Signs: Temp: 98.4 F (36.9 C) (03/02 1659) Temp Source: Oral (03/02 1659) BP: 136/69 (03/02 1659)  Labs: Recent Labs    02/21/18 2307  02/22/18 0357 02/22/18 1432  02/23/18 0936 02/23/18 1150 02/24/18 0219 02/24/18 1130 02/24/18 1859  HGB  --    < > 14.7  --   --  12.1  --  11.3*  --   --   HCT  --   --  42.7  --   --  37.0  --  35.0*  --   --   PLT  --   --  369  --   --  330  --  351  --   --   HEPARINUNFRC  --   --   --   --    < > 0.36  --  0.20* 0.21* 0.47  CREATININE  --   --  0.74  --   --   --  0.86  --   --   --   TROPONINI <0.03  --  <0.03 <0.03  --   --   --   --   --   --    < > = values in this interval not displayed.    Estimated Creatinine Clearance: 83 mL/min (by C-G formula based on SCr of 0.86 mg/dL).   Medications:  Scheduled:  . amLODipine  5 mg Oral Daily  . aspirin EC  81 mg Oral Daily  . atorvastatin  40 mg Oral q1800  . escitalopram  10 mg Oral Daily  . famotidine  20 mg Oral BID  . feeding supplement (ENSURE ENLIVE)  237 mL Oral BID BM  . metoCLOPramide (REGLAN) injection  10 mg Intravenous Q8H  . metoprolol tartrate  12.5 mg Oral BID  . multivitamin with minerals  1 tablet Oral Daily  . nitroGLYCERIN  1 inch Topical Q8H  . pantoprazole  40 mg Oral Daily  . traZODone  50 mg Oral QHS   Infusions:  . heparin 1,500 Units/hr (02/24/18 1249)    Assessment: 13 YOF presented with chest pain, N/V and positive nuclear stress test with known L subclavian occlusion. Cardiology planning for cath Mon 3/4 and VVS planning L carotid subclavian bypass next week. Patient is not on anticoag PTA, pharmacy to dose heparin.  Heparin level this evening is therapeutic after a rate  increase earlier today (HL 0.47 << 0.21, goal of 0.3-0.7). Hgb/Hct slight drop, plts wnl.    Goal of Therapy:  Heparin level 0.3-0.7 units/ml Monitor platelets by anticoagulation protocol: Yes   Plan:  1. Continue Heparin at 1500 units/hr (15 ml/hr) 2. Will continue to monitor for any signs/symptoms of bleeding and will follow up with heparin level in the a.m to confirm 3. F/u cardiology and VVS plans post-cath 3/4  Thank you for allowing pharmacy to be a part of this patient's care.  Alycia Rossetti, PharmD, BCPS Clinical Pharmacist Pager: [phone] If after 3:30p, please call main pharmacy at: [phone] 02/24/2018 8:05 PM

## 2018-02-24 NOTE — Progress Notes (Signed)
ANTICOAGULATION CONSULT NOTE - Follow Up Consult  Pharmacy Consult for Heparin Indication: chest pain/ACS  No Known Allergies  Patient Measurements: Height: 5\' 6"  (167.6 cm) Weight: 183 lb 11.2 oz (83.3 kg) IBW/kg (Calculated) : 59.3 Heparin Dosing Weight: 77 kg  Vital Signs: Temp: 98.6 F (37 C) (03/01 2100) Temp Source: Oral (03/01 2100) BP: 118/66 (03/01 2100) Pulse Rate: 51 (03/01 2100)  Labs: Recent Labs    02/21/18 1545  02/21/18 2307 02/22/18 0357 02/22/18 1432 02/23/18 0220 02/23/18 0936 02/23/18 1150 02/24/18 0219  HGB 14.7  --   --  14.7  --   --  12.1  --  11.3*  HCT 43.0  --   --  42.7  --   --  37.0  --  35.0*  PLT 359  --   --  369  --   --  330  --  351  HEPARINUNFRC  --   --   --   --   --  <0.10* 0.36  --  0.20*  CREATININE 0.95  --   --  0.74  --   --   --  0.86  --   TROPONINI <0.03   < > <0.03 <0.03 <0.03  --   --   --   --    < > = values in this interval not displayed.    Estimated Creatinine Clearance: 83.2 mL/min (by C-G formula based on SCr of 0.86 mg/dL).  Assessment:  Pt presented 02/22/18 with chest pain, nausea, and vomiting, with Positive nuclear med stress test. Vascular surgery was planning for Left carotid to subclavian bypass. Pharmacy consulted to start heparin. Not on anticoagulation PTA.   Update 3/2 AM: heparin level low this AM, no issues per RN  Goal of Therapy:  Heparin level 0.3-0.7 units/ml Monitor platelets by anticoagulation protocol: Yes   Plan:  Inc heparin to 1350 units/hr 1200 HL  Narda Bonds, PharmD, BCPS Clinical Pharmacist Phone: [phone]

## 2018-02-25 DIAGNOSIS — R0789 Other chest pain: Secondary | ICD-10-CM

## 2018-02-25 LAB — GLUCOSE, CAPILLARY
GLUCOSE-CAPILLARY: 98 mg/dL (ref 65–99)
Glucose-Capillary: 104 mg/dL — ABNORMAL HIGH (ref 65–99)
Glucose-Capillary: 110 mg/dL — ABNORMAL HIGH (ref 65–99)

## 2018-02-25 LAB — BASIC METABOLIC PANEL
ANION GAP: 9 (ref 5–15)
BUN: 10 mg/dL (ref 6–20)
CALCIUM: 8.7 mg/dL — AB (ref 8.9–10.3)
CO2: 25 mmol/L (ref 22–32)
CREATININE: 0.85 mg/dL (ref 0.44–1.00)
Chloride: 103 mmol/L (ref 101–111)
GLUCOSE: 89 mg/dL (ref 65–99)
Potassium: 3.6 mmol/L (ref 3.5–5.1)
Sodium: 137 mmol/L (ref 135–145)

## 2018-02-25 LAB — CBC
HEMATOCRIT: 32.6 % — AB (ref 36.0–46.0)
Hemoglobin: 11 g/dL — ABNORMAL LOW (ref 12.0–15.0)
MCH: 29.8 pg (ref 26.0–34.0)
MCHC: 33.7 g/dL (ref 30.0–36.0)
MCV: 88.3 fL (ref 78.0–100.0)
Platelets: 311 10*3/uL (ref 150–400)
RBC: 3.69 MIL/uL — ABNORMAL LOW (ref 3.87–5.11)
RDW: 13.9 % (ref 11.5–15.5)
WBC: 8.5 10*3/uL (ref 4.0–10.5)

## 2018-02-25 LAB — HEPARIN LEVEL (UNFRACTIONATED): HEPARIN UNFRACTIONATED: 0.33 [IU]/mL (ref 0.30–0.70)

## 2018-02-25 MED ORDER — DOCUSATE SODIUM 50 MG/5ML PO LIQD
100.0000 mg | Freq: Once | ORAL | Status: AC
  Administered 2018-02-25: 100 mg via ORAL
  Filled 2018-02-25: qty 10

## 2018-02-25 MED ORDER — SODIUM CHLORIDE 0.9 % IV SOLN: 250.0000 mL | INTRAVENOUS | Status: DC | PRN

## 2018-02-25 MED ORDER — SODIUM CHLORIDE 0.9% FLUSH
3.0000 mL | Freq: Two times a day (BID) | INTRAVENOUS | Status: DC
  Administered 2018-02-25 – 2018-02-26 (×2): 3 mL via INTRAVENOUS

## 2018-02-25 MED ORDER — SODIUM CHLORIDE 0.9% FLUSH: 3.0000 mL | INTRAVENOUS | Status: DC | PRN

## 2018-02-25 MED ORDER — ASPIRIN 81 MG PO CHEW
81.0000 mg | CHEWABLE_TABLET | ORAL | Status: AC
  Administered 2018-02-26: 81 mg via ORAL
  Filled 2018-02-25: qty 1

## 2018-02-25 MED ORDER — POTASSIUM CHLORIDE CRYS ER 20 MEQ PO TBCR
40.0000 meq | EXTENDED_RELEASE_TABLET | Freq: Once | ORAL | Status: AC
  Administered 2018-02-25: 40 meq via ORAL
  Filled 2018-02-25: qty 2

## 2018-02-25 MED ORDER — SODIUM CHLORIDE 0.9 % WEIGHT BASED INFUSION
3.0000 mL/kg/h | INTRAVENOUS | Status: DC
  Administered 2018-02-25: 3 mL/kg/h via INTRAVENOUS

## 2018-02-25 MED ORDER — SODIUM CHLORIDE 0.9 % WEIGHT BASED INFUSION
1.0000 mL/kg/h | INTRAVENOUS | Status: DC
  Administered 2018-02-25 – 2018-02-26 (×2): 1 mL/kg/h via INTRAVENOUS

## 2018-02-25 NOTE — Progress Notes (Signed)
Subjective:  Having some intermittent chest pain overnight as well as dyspnea.  Currently on IV heparin.  Objective:  Vital Signs in the last 24 hours: BP 102/65 (BP Location: Right Arm)   Pulse (!) 58   Temp 97.8 F (36.6 C) (Oral)   Resp 18   Ht 5\' 6"  (1.676 m)   Wt 83.3 kg (183 lb 9.6 oz)   LMP 02/09/2012   SpO2 98%   BMI 29.63 kg/m   Physical Exam: Pleasant obese female in no acute distress Lungs:  Clear Cardiac:  Regular rhythm, normal S1 and S2, no S3 Extremities:  No edema present  Intake/Output from previous day: 03/02 0701 - 03/03 0700 In: 950 [P.O.:830; I.V.:120] Out: -   Weight Filed Weights   02/23/18 0500 02/24/18 0543 02/25/18 0508  Weight: 83.3 kg (183 lb 11.2 oz) 82.9 kg (182 lb 12.8 oz) 83.3 kg (183 lb 9.6 oz)    Lab Results: Basic Metabolic Panel: Recent Labs    02/23/18 1150 02/25/18 0810  NA 137 137  K 3.2* 3.6  CL 107 103  CO2 19* 25  GLUCOSE 83 89  BUN 11 10  CREATININE 0.86 0.85   CBC: Recent Labs    02/24/18 0219 02/25/18 0810  WBC 8.8 8.5  HGB 11.3* 11.0*  HCT 35.0* 32.6*  MCV 88.6 88.3  PLT 351 311   Cardiac Panel (last 3 results) Recent Labs    02/22/18 1432  TROPONINI <0.03    Telemetry: Sinus rhythm   Assessment/Plan:  1.  Peripheral vascular disease with need for left carotid to subclavian bypass 2.  Chest pain with nausea and vomiting abnormal nuclear stress test  Recommendations:  Cardiac catheterization was discussed with the patient fully including risks of myocardial infarction, death, stroke, bleeding, arrhythmia, dye allergy, renal insufficiency or bleeding.  The patient understands and is willing to proceed.  Possibility of intervention at the same time also discussed with patient and she understands and is agreeable to proceed      W. Doristine Church  MD [facility]  02/25/2018, 11:05 AM

## 2018-02-25 NOTE — Progress Notes (Signed)
Rita [name] TEAM 1 - Stepdown/ICU TEAM  IVIS Rita [name]  TMH:[id number] DOB: August 09, 1966 DOA: 02/21/2018 PCP: Caren Macadam, MD    Brief Narrative:  52 y.o.F w/ a Hx of GERD with Barrett esophagus and esophagitis, HTN, Heart murmur, L subclavian stenosis, Chronic abdominal/back pain, Hiatal Hernia, Pancreatitis, and Tobacco use who presented w/ chest pain, nausea, and vomiting.  She underwent a nuclear med stress test as part of a preoperative cardiac evaluation (preparing for left carotid to subclavian bypass), and developed chest pain with nausea during the test. She vomiting once during the stress test, and went on to have many episodes of nonbloody vomiting afterwards.   In the ED EKG featured a sinus rhythm with right bundle branch block and no significant change from prior. Troponin was undetectable. CTA chest/abdomen/pelvis was negative for acute vascular findings, but notable for the known occlusion of the proximal left subclavian. Her chest pain persisted.    Significant Events: 2/27 admit  Subjective: Awaiting cardiac cath 3/4.  Some intermittent chest pain persists/  Remains on medical management presently, to include IV heparin.     Assessment & Plan:   Abnormal nuclear med stress test - chest pain  -Intermediate risk study consistent with ischemia -Echocardiogram: WNL  -Heparin drip per pharmacy -for cardiac cath Monday  -CTA chest/abdomen/pelvis negative for overt vascular process -Serial cardiac enzymes negative -ASA 81 mg + BB  LEFT subclavian artery occlusion -Vascular Surgery planning for LEFT carotid to subclavian bypass after cardiac w/u   Intractable nausea and vomiting - GERD/Hiatal hernia  -Patient has chronic nausea with frequent vomiting usually controlled with antiemetics at home, likely secondary to hiatal hernia. -Hx of hiatal hernia and Barrett's with esophagitis  Hypokalemia - improved w/ supplementation   HLD -Lipitor 40 mg  daily  Tobacco abuse -Counseled at length on absolute requirement to discontinue use  DVT prophylaxis: heparin gtt Code Status: FULL CODE Family Communication: spoke w/ pt and family at bedside   Disposition Plan: awaiting cardiac cath   Consultants:  Cardiology   Antimicrobials:  PCN V > 3/2  Objective: Blood pressure 120/64, pulse 60, temperature 98.4 F (36.9 C), temperature source Oral, resp. rate 18, height 5\' 6"  (1.676 m), weight 83.3 kg (183 lb 9.6 oz), last menstrual period 02/09/2012, SpO2 98 %.  Intake/Output Summary (Last 24 hours) at 02/25/2018 1542 Last data filed at 02/25/2018 1102 Gross per 24 hour  Intake 1070.75 ml  Output -  Net 1070.75 ml   Filed Weights   02/23/18 0500 02/24/18 0543 02/25/18 0508  Weight: 83.3 kg (183 lb 11.2 oz) 82.9 kg (182 lb 12.8 oz) 83.3 kg (183 lb 9.6 oz)    Examination: General: No acute respiratory distress Lungs: Clear to auscultation bilaterally  Cardiovascular: Regular rate and rhythm  Abdomen: Nontender, nondistended, soft, bowel sounds positive Extremities: No significant edema bilateral lower extremities  CBC: Recent Labs  Lab 02/21/18 1545 02/22/18 0357 02/23/18 0936 02/24/18 0219 02/25/18 0810  WBC 12.6* 13.9* 8.0 8.8 8.5  NEUTROABS 10.8*  --   --   --   --   HGB 14.7 14.7 12.1 11.3* 11.0*  HCT 43.0 42.7 37.0 35.0* 32.6*  MCV 87.8 86.1 90.5 88.6 88.3  PLT 359 369 330 351 921   Basic Metabolic Panel: Recent Labs  Lab 02/21/18 1545 02/22/18 0357 02/22/18 1432 02/23/18 1150 02/25/18 0810  NA 138 138  --  137 137  K 3.0* 2.6* 3.1* 3.2* 3.6  CL 103 101  --  107 103  CO2 15* 24  --  19* 25  GLUCOSE 150* 123*  --  83 89  BUN 11 5*  --  11 10  CREATININE 0.95 0.74  --  0.86 0.85  CALCIUM 9.1 8.3*  --  8.2* 8.7*  MG  --  2.2 2.1 2.1  --    GFR: Estimated Creatinine Clearance: 84.2 mL/min (by C-G formula based on SCr of 0.85 mg/dL).  Liver Function Tests: Recent Labs  Lab 02/21/18 1545  AST 28   ALT 19  ALKPHOS 72  BILITOT 0.8  PROT 7.5  ALBUMIN 4.2   Recent Labs  Lab 02/21/18 1545  LIPASE 33    Cardiac Enzymes: Recent Labs  Lab 02/21/18 1545 02/21/18 2040 02/21/18 2307 02/22/18 0357 02/22/18 1432  TROPONINI <0.03 <0.03 <0.03 <0.03 <0.03    HbA1C: Hgb A1c MFr Bld  Date/Time Value Ref Range Status  01/17/2018 11:53 AM 5.4 <5.7 % of total Hgb Final    Comment:    For the purpose of screening for the presence of diabetes: . <5.7%       Consistent with the absence of diabetes 5.7-6.4%    Consistent with increased risk for diabetes             (prediabetes) > or =6.5%  Consistent with diabetes . This assay result is consistent with a decreased risk of diabetes. . Currently, no consensus exists regarding use of hemoglobin A1c for diagnosis of diabetes in children. . According to American Diabetes Association (ADA) guidelines, hemoglobin A1c <7.0% represents optimal control in non-pregnant diabetic patients. Different metrics may apply to specific patient populations.  Standards of Medical Care in Diabetes(ADA). .     CBG: Recent Labs  Lab 02/24/18 1146 02/24/18 1630 02/24/18 2118 02/25/18 0727 02/25/18 1136  GLUCAP 85 106* 102* 104* 98    Recent Results (from the past 240 hour(s))  Urine Culture     Status: Abnormal   Collection Time: 02/22/18  8:04 AM  Result Value Ref Range Status   Specimen Description URINE, RANDOM  Final   Special Requests NONE  Final   Culture (A)  Final    <10,000 COLONIES/mL INSIGNIFICANT GROWTH Performed at [facility], [place] [address], [place], [place] [address]    Report Status 02/23/2018 FINAL  Final  Culture, blood (routine x 2)     Status: None (Preliminary result)   Collection Time: 02/22/18  2:50 PM  Result Value Ref Range Status   Specimen Description BLOOD LEFT ARM  Final   Special Requests IN PEDIATRIC BOTTLE Blood Culture adequate volume  Final   Culture   Final    NO GROWTH 3  DAYS Performed at [facility], [place] [address], [place], [place] [address]    Report Status PENDING  Incomplete  Culture, blood (routine x 2)     Status: None (Preliminary result)   Collection Time: 02/22/18  2:55 PM  Result Value Ref Range Status   Specimen Description BLOOD LEFT HAND  Final   Special Requests IN PEDIATRIC BOTTLE Blood Culture adequate volume  Final   Culture   Final    NO GROWTH 3 DAYS Performed at [facility], [place] [address], [place], [place] [address]    Report Status PENDING  Incomplete     Scheduled Meds: . amLODipine  5 mg Oral Daily  . [START ON 02/26/2018] aspirin  81 mg Oral Pre-Cath  . aspirin EC  81 mg Oral Daily  . atorvastatin  40 mg Oral q1800  . escitalopram  10 mg Oral Daily  . famotidine  20 mg Oral BID  . feeding supplement (ENSURE ENLIVE)  237 mL Oral BID BM  . metoCLOPramide (REGLAN) injection  10 mg Intravenous Q8H  . metoprolol tartrate  12.5 mg Oral BID  . multivitamin with minerals  1 tablet Oral Daily  . nitroGLYCERIN  1 inch Topical Q8H  . pantoprazole  40 mg Oral Daily  . sodium chloride flush  3 mL Intravenous Q12H  . traZODone  50 mg Oral QHS     LOS: 3 days   Cherene Altes, MD [facility] Office  [phone] Pager - Text Page per Amion as per below:  On-Call/Text Page:      [email or web address]      password [email or web address]  If 7PM-7AM, please contact night-coverage [email or web address] Password [facility] 02/25/2018, 3:42 PM

## 2018-02-25 NOTE — Plan of Care (Signed)
  Clinical Measurements: Cardiovascular complication will be avoided 02/25/2018 1948 - Progressing by Irish Lack, RN   Clinical Measurements: Ability to maintain clinical measurements within normal limits will improve 02/25/2018 1948 - Progressing by Irish Lack, RN

## 2018-02-25 NOTE — Progress Notes (Signed)
ANTICOAGULATION CONSULT NOTE - Follow Up Consult  Pharmacy Consult for heparin Indication: chest pain/ACS  No Known Allergies  Patient Measurements: Height: 5\' 6"  (167.6 cm) Weight: 183 lb 9.6 oz (83.3 kg) IBW/kg (Calculated) : 59.3 Heparin Dosing Weight: 77 kg   Vital Signs: Temp: 97.8 F (36.6 C) (03/03 0728) Temp Source: Oral (03/03 0728) BP: 102/65 (03/03 0728) Pulse Rate: 58 (03/03 0728)  Labs: Recent Labs    02/22/18 1432  02/23/18 0936 02/23/18 1150 02/24/18 0219 02/24/18 1130 02/24/18 1859 02/25/18 0810  HGB  --    < > 12.1  --  11.3*  --   --  11.0*  HCT  --   --  37.0  --  35.0*  --   --  32.6*  PLT  --   --  330  --  351  --   --  311  HEPARINUNFRC  --    < > 0.36  --  0.20* 0.21* 0.47 0.33  CREATININE  --   --   --  0.86  --   --   --  0.85  TROPONINI <0.03  --   --   --   --   --   --   --    < > = values in this interval not displayed.    Estimated Creatinine Clearance: 84.2 mL/min (by C-G formula based on SCr of 0.85 mg/dL).   Medications:  Scheduled:  . amLODipine  5 mg Oral Daily  . aspirin EC  81 mg Oral Daily  . atorvastatin  40 mg Oral q1800  . escitalopram  10 mg Oral Daily  . famotidine  20 mg Oral BID  . feeding supplement (ENSURE ENLIVE)  237 mL Oral BID BM  . metoCLOPramide (REGLAN) injection  10 mg Intravenous Q8H  . metoprolol tartrate  12.5 mg Oral BID  . multivitamin with minerals  1 tablet Oral Daily  . nitroGLYCERIN  1 inch Topical Q8H  . pantoprazole  40 mg Oral Daily  . traZODone  50 mg Oral QHS   Infusions:  . heparin 1,500 Units/hr (02/25/18 0314)    Assessment: Rita [name] presented with chest pain, N/V and positive nuclear stress test with known L subclavian occlusion. Cardiology planning for cath Mon 3/4 and VVS planning L carotid subclavian bypass next week. Patient is not on anticoag PTA, pharmacy to dose heparin.  Heparin level is 0.33, at low end of goal range. CBC stable, no bleeding noted.  Goal of Therapy:   Heparin level 0.3-0.7 units/ml Monitor platelets by anticoagulation protocol: Yes   Plan:  Increase heparin slightly to 1550 units/hr Daily heparin level, CBC, monitor for s/sx of bleeding F/u cardiology and VVS plans post-cath 3/4   Charlene Brooke, PharmD PGY1 Pharmacy Resident Phone: [phone] After 3:30PM please call [place] [phone] 02/25/2018,9:54 AM

## 2018-02-26 ENCOUNTER — Encounter (HOSPITAL_COMMUNITY)
Admission: EM | Disposition: A | Discharge status: Home / Self Care | Payer: Self-pay | Source: Home / Self Care | Attending: Internal Medicine

## 2018-02-26 DIAGNOSIS — R072 Precordial pain: Secondary | ICD-10-CM

## 2018-02-26 DIAGNOSIS — R112 Nausea with vomiting, unspecified: Secondary | ICD-10-CM

## 2018-02-26 DIAGNOSIS — I25119 Atherosclerotic heart disease of native coronary artery with unspecified angina pectoris: Secondary | ICD-10-CM

## 2018-02-26 DIAGNOSIS — E782 Mixed hyperlipidemia: Secondary | ICD-10-CM

## 2018-02-26 HISTORY — PX: LEFT HEART CATH AND CORONARY ANGIOGRAPHY: CATH118249

## 2018-02-26 LAB — PROTIME-INR
INR: 0.96
Prothrombin Time: 12.7 seconds (ref 11.4–15.2)

## 2018-02-26 LAB — BASIC METABOLIC PANEL
ANION GAP: 9 (ref 5–15)
BUN: 12 mg/dL (ref 6–20)
CALCIUM: 9.1 mg/dL (ref 8.9–10.3)
CO2: 25 mmol/L (ref 22–32)
Chloride: 105 mmol/L (ref 101–111)
Creatinine, Ser: 0.92 mg/dL (ref 0.44–1.00)
GFR calc Af Amer: >60 mL/min (ref >60)
GFR calc non Af Amer: >60 mL/min (ref >60)
Glucose, Bld: 99 mg/dL (ref 65–99)
POTASSIUM: 3.8 mmol/L (ref 3.5–5.1)
Sodium: 139 mmol/L (ref 135–145)

## 2018-02-26 LAB — CBC
HEMATOCRIT: 37 % (ref 36.0–46.0)
HEMOGLOBIN: 12.2 g/dL (ref 12.0–15.0)
MCH: 29.4 pg (ref 26.0–34.0)
MCHC: 33 g/dL (ref 30.0–36.0)
MCV: 89.2 fL (ref 78.0–100.0)
Platelets: 323 10*3/uL (ref 150–400)
RBC: 4.15 MIL/uL (ref 3.87–5.11)
RDW: 14.3 % (ref 11.5–15.5)
WBC: 7.5 10*3/uL (ref 4.0–10.5)

## 2018-02-26 LAB — HEPARIN LEVEL (UNFRACTIONATED): HEPARIN UNFRACTIONATED: 0.48 [IU]/mL (ref 0.30–0.70)

## 2018-02-26 LAB — GLUCOSE, CAPILLARY: GLUCOSE-CAPILLARY: 110 mg/dL — AB (ref 65–99)

## 2018-02-26 SURGERY — LEFT HEART CATH AND CORONARY ANGIOGRAPHY
Anesthesia: LOCAL

## 2018-02-26 MED ORDER — HEPARIN (PORCINE) IN NACL 2-0.9 UNIT/ML-% IJ SOLN
INTRAMUSCULAR | Status: AC
  Filled 2018-02-26: qty 1000

## 2018-02-26 MED ORDER — LIDOCAINE HCL 1 % IJ SOLN
INTRAMUSCULAR | Status: AC
  Filled 2018-02-26: qty 20

## 2018-02-26 MED ORDER — HEPARIN SODIUM (PORCINE) 1000 UNIT/ML IJ SOLN
INTRAMUSCULAR | Status: DC | PRN
  Administered 2018-02-26: 4000 [IU] via INTRAVENOUS

## 2018-02-26 MED ORDER — SODIUM CHLORIDE 0.9% FLUSH: 3.0000 mL | Freq: Two times a day (BID) | INTRAVENOUS | Status: DC

## 2018-02-26 MED ORDER — SODIUM CHLORIDE 0.9 % IV SOLN: 250.0000 mL | INTRAVENOUS | Status: DC | PRN

## 2018-02-26 MED ORDER — OXYCODONE HCL 5 MG PO TABS: 5.0000 mg | ORAL_TABLET | ORAL | 0 refills | Status: DC | PRN

## 2018-02-26 MED ORDER — MIDAZOLAM HCL 2 MG/2ML IJ SOLN
INTRAMUSCULAR | Status: AC
  Filled 2018-02-26: qty 2

## 2018-02-26 MED ORDER — SODIUM CHLORIDE 0.9% FLUSH: 3.0000 mL | INTRAVENOUS | Status: DC | PRN

## 2018-02-26 MED ORDER — FENTANYL CITRATE (PF) 100 MCG/2ML IJ SOLN
INTRAMUSCULAR | Status: AC
  Filled 2018-02-26: qty 2

## 2018-02-26 MED ORDER — LIDOCAINE HCL (PF) 1 % IJ SOLN
INTRAMUSCULAR | Status: DC | PRN
  Administered 2018-02-26: 2 mL via INTRADERMAL

## 2018-02-26 MED ORDER — VERAPAMIL HCL 2.5 MG/ML IV SOLN
INTRAVENOUS | Status: DC | PRN
  Administered 2018-02-26: 10 mL via INTRA_ARTERIAL

## 2018-02-26 MED ORDER — IOPAMIDOL (ISOVUE-370) INJECTION 76%
INTRAVENOUS | Status: AC
  Filled 2018-02-26: qty 100

## 2018-02-26 MED ORDER — HEPARIN (PORCINE) IN NACL 2-0.9 UNIT/ML-% IJ SOLN
INTRAMUSCULAR | Status: AC | PRN
  Administered 2018-02-26: 1000 mL

## 2018-02-26 MED ORDER — IOPAMIDOL (ISOVUE-370) INJECTION 76%
INTRAVENOUS | Status: DC | PRN
  Administered 2018-02-26: 50 mL via INTRAVENOUS

## 2018-02-26 MED ORDER — SODIUM CHLORIDE 0.9 % IV SOLN: INTRAVENOUS | Status: DC

## 2018-02-26 MED ORDER — HEPARIN SODIUM (PORCINE) 1000 UNIT/ML IJ SOLN
INTRAMUSCULAR | Status: AC
  Filled 2018-02-26: qty 1

## 2018-02-26 MED ORDER — VERAPAMIL HCL 2.5 MG/ML IV SOLN
INTRAVENOUS | Status: AC
  Filled 2018-02-26: qty 2

## 2018-02-26 SURGICAL SUPPLY — 8 items
CATH IMPULSE 5F ANG/FL3.5 (CATHETERS) ×2 IMPLANT
GLIDESHEATH SLEND SS 6F .021 (SHEATH) ×2 IMPLANT
GUIDEWIRE INQWIRE 1.5J.035X260 (WIRE) ×1 IMPLANT
INQWIRE 1.5J .035X260CM (WIRE) ×2
KIT HEART LEFT (KITS) ×2 IMPLANT
PACK CARDIAC CATHETERIZATION (CUSTOM PROCEDURE TRAY) ×2 IMPLANT
TRANSDUCER W/STOPCOCK (MISCELLANEOUS) ×2 IMPLANT
TUBING CIL FLEX 10 FLL-RA (TUBING) ×2 IMPLANT

## 2018-02-26 NOTE — H&P (View-Only) (Signed)
Progress Note  Patient Name: Rita [name] Date of Encounter: 02/26/2018  Primary Cardiologist: No primary care provider on file.  Subjective   Some chest tightness this morning. Sitting comfortably in the bed. Family at bedside.   Inpatient Medications    Scheduled Meds: . amLODipine  5 mg Oral Daily  . aspirin EC  81 mg Oral Daily  . atorvastatin  40 mg Oral q1800  . escitalopram  10 mg Oral Daily  . famotidine  20 mg Oral BID  . feeding supplement (ENSURE ENLIVE)  237 mL Oral BID BM  . metoCLOPramide (REGLAN) injection  10 mg Intravenous Q8H  . metoprolol tartrate  12.5 mg Oral BID  . multivitamin with minerals  1 tablet Oral Daily  . nitroGLYCERIN  1 inch Topical Q8H  . pantoprazole  40 mg Oral Daily  . sodium chloride flush  3 mL Intravenous Q12H  . traZODone  50 mg Oral QHS   Continuous Infusions: . sodium chloride    . sodium chloride 1 mL/kg/hr (02/26/18 1009)  . heparin 1,550 Units/hr (02/26/18 1009)   PRN Meds: sodium chloride, acetaminophen, hydrALAZINE, morphine injection, nitroGLYCERIN, ondansetron (ZOFRAN) IV, oxyCODONE, promethazine, sodium chloride flush   Vital Signs    Vitals:   02/25/18 1716 02/25/18 2004 02/25/18 2127 02/26/18 0518  BP: 128/67 126/70 132/77 112/64  Pulse: 63 60 60 (!) 55  Resp: 18     Temp: 98.6 F (37 C) 98 F (36.7 C)  97.7 F (36.5 C)  TempSrc: Oral Oral  Oral  SpO2: 98% 97%  98%  Weight:    183 lb 6.4 oz (83.2 kg)  Height:        Intake/Output Summary (Last 24 hours) at 02/26/2018 1032 Last data filed at 02/26/2018 0900 Gross per 24 hour  Intake 1497 ml  Output -  Net 1497 ml   Filed Weights   02/24/18 0543 02/25/18 0508 02/26/18 0518  Weight: 182 lb 12.8 oz (82.9 kg) 183 lb 9.6 oz (83.3 kg) 183 lb 6.4 oz (83.2 kg)    Telemetry    SR - Personally Reviewed  ECG    N/a - Personally Reviewed  Physical Exam   General: Well developed, well nourished, female appearing in no acute distress. Head:  Normocephalic, atraumatic.  Neck: Supple, no JVD. Lungs:  Resp regular and unlabored, CTA. Heart: RRR, S1, S2, no S3, S4, or murmur; no rub. Abdomen: Soft, non-tender, non-distended with normoactive bowel sounds.  Extremities: No clubbing, cyanosis, edema. Distal pedal pulses are 2+ bilaterally. Neuro: Alert and oriented X 3. Moves all extremities spontaneously. Psych: Normal affect.  Labs    Chemistry Recent Labs  Lab 02/21/18 1545  02/23/18 1150 02/25/18 0810 02/26/18 0500  NA 138   < > 137 137 139  K 3.0*   < > 3.2* 3.6 3.8  CL 103   < > 107 103 105  CO2 15*   < > 19* 25 25  GLUCOSE 150*   < > 83 89 99  BUN 11   < > 11 10 12   CREATININE 0.95   < > 0.86 0.85 0.92  CALCIUM 9.1   < > 8.2* 8.7* 9.1  PROT 7.5  --   --   --   --   ALBUMIN 4.2  --   --   --   --   AST 28  --   --   --   --   ALT 19  --   --   --   --  ALKPHOS 72  --   --   --   --   BILITOT 0.8  --   --   --   --   GFRNONAA >60   < > >60 >60 >60  GFRAA >60   < > >60 >60 >60  ANIONGAP 20*   < > 11 9 9    < > = values in this interval not displayed.     Hematology Recent Labs  Lab 02/24/18 0219 02/25/18 0810 02/26/18 0500  WBC 8.8 8.5 7.5  RBC 3.95 3.69* 4.15  HGB 11.3* 11.0* 12.2  HCT 35.0* 32.6* 37.0  MCV 88.6 88.3 89.2  MCH 28.6 29.8 29.4  MCHC 32.3 33.7 33.0  RDW 14.1 13.9 14.3  PLT 351 311 323    Cardiac Enzymes Recent Labs  Lab 02/21/18 2040 02/21/18 2307 02/22/18 0357 02/22/18 1432  TROPONINI <0.03 <0.03 <0.03 <0.03   No results for input(s): TROPIPOC in the last 168 hours.   BNPNo results for input(s): BNP, PROBNP in the last 168 hours.   DDimer No results for input(s): DDIMER in the last 168 hours.   Lipid Panel     Component Value Date/Time   CHOL 272 (H) 01/17/2018 1153   TRIG 348 (H) 01/17/2018 1153   HDL 42 (L) 01/17/2018 1153   CHOLHDL 6.5 (H) 01/17/2018 1153   VLDL 32 01/01/2017 0552   LDLCALC 98 01/01/2017 0552    Radiology    No results found.  Cardiac  Studies   TTE: 02/23/18   Study Conclusions  - Left ventricle: The cavity size was normal. Systolic function was   normal. The estimated ejection fraction was in the range of 55%   to 60%. Wall motion was normal; there were no regional wall   motion abnormalities. The study is not technically sufficient to   allow evaluation of LV diastolic function. - Aortic valve: Transvalvular velocity was within the normal range.   There was no stenosis. There was no regurgitation. - Mitral valve: Transvalvular velocity was within the normal range.   There was no evidence for stenosis. There was trivial   regurgitation. - Left atrium: The atrium was moderately dilated. - Right ventricle: The cavity size was normal. Wall thickness was   normal. Systolic function was normal. - Atrial septum: No defect or patent foramen ovale was identified   by color flow Doppler. - Pulmonary arteries: Systolic pressure was within the normal   range.  Patient Profile     52 y.o. female with a hx of COPD, hypertension, hyperlipidemia, Barrett's esophagus with esophagitis, peptic ulcer disease, ongoing tobacco abuse and left subclavian stenosis who was seen after an abnormal stress test.   Assessment & Plan    1. Chest pain/unstable angina: -Mixed symptoms.  She had tender chest wall however her chest tightness/pressure was different.   -Her cardiac risk factor includes hyperlipidemia, hypertension, ongoing tobacco smoking and family history of CAD. Troponin has been remained negative. -Echo showed reassuring EF. Stress test medium defect of moderate severity present in the mid anterior, mid anteroseptal, apical anterior and apical septal location. Findings consistent with ischemia. - planned for cardiac cath today  2.Left subclavian artery occlusion: -Follows with vascular surgery, planning for left carotid-to-subclavian bypass once cardiac clearnace.  3.  Intractable nausea and vomiting: -Has a chronic  GI issue, stable this morning.    4. HLD: - 01/17/2018: Cholesterol 272; HDL 42; Triglycerides 348 . LDL 171. - Continue lipitor 40mg  qd.   5. Tobacco Use:  cessation advised    Signed, Reino Bellis, NP  02/26/2018, 10:32 AM  Pager # [phone]   For questions or updates, please contact [place] Please consult [email or web address] for contact info under Cardiology/STEMI.  Patient seen and examined. Agree with assessment and plan.Admits to recurrent chest pain since in hospital. Abnormal nuclear study suggestive of LAD ischemia. ECG with SB at 56, RBBB with repolarization changes and PACs.  High-grade stenosis or focal short segment occlusion of the proximal left subclavian artery, proximal to the vertebral artery predisposing to subclavian steal syndrome. Planning for left carotid-to-subclavian bypass. Discussed smoking cessation. Significant mixed hyperlipidemia with atherogenic dyslipidemic profile with TC 272, TG 378 and LDL 98. Increase atorvastatin to 80 mg; consider vascepa 2 grams bid per [employer]-it trial data. Needs improved diet. Plan cath with possible PCI today.  I have reviewed the risks, indications, and alternatives to cardiac catheterization, possible angioplasty, and stenting with the patient. Risks include but are not limited to bleeding, infection, vascular injury, stroke, myocardial infection, arrhythmia, kidney injury, radiation-related injury in the case of prolonged fluoroscopy use, emergency cardiac surgery, and death. The patient understands the risks of serious complication is 1-2 in 0786 with diagnostic cardiac cath and 1-2% or less with angioplasty/stenting.      Troy Sine, MD, [facility] 02/26/2018 11:01 AM

## 2018-02-26 NOTE — Progress Notes (Signed)
Discharge teaching complete. Meds, diet, activity, follow up appointments and TR band teaching reviewed and all questions answered. Copy of instructions and Oxycodone prescription given to patient. Boyfriend at bedside for teaching.

## 2018-02-26 NOTE — Progress Notes (Deleted)
Patient off floor to Endo.   

## 2018-02-26 NOTE — Discharge Instructions (Signed)
Chest Wall Pain °Chest wall pain is pain in or around the bones and muscles of your chest. Sometimes, an injury causes this pain. Sometimes, the cause may not be known. This pain may take several weeks or longer to get better. °Follow these instructions at home: °Pay attention to any changes in your symptoms. Take these actions to help with your pain: °· Rest as told by your doctor. °· Avoid activities that cause pain. Try not to use your chest, belly (abdominal), or side muscles to lift heavy things. °· If directed, apply ice to the painful area: °? Put ice in a plastic bag. °? Place a towel between your skin and the bag. °? Leave the ice on for 20 minutes, 2-3 times per day. °· Take over-the-counter and prescription medicines only as told by your doctor. °· Do not use tobacco products, including cigarettes, chewing tobacco, and e-cigarettes. If you need help quitting, ask your doctor. °· Keep all follow-up visits as told by your doctor. This is important. ° °Contact a doctor if: °· You have a fever. °· Your chest pain gets worse. °· You have new symptoms. °Get help right away if: °· You feel sick to your stomach (nauseous) or you throw up (vomit). °· You feel sweaty or light-headed. °· You have a cough with phlegm (sputum) or you cough up blood. °· You are short of breath. °This information is not intended to replace advice given to you by your health care provider. Make sure you discuss any questions you have with your health care provider. °Document Released: 05/30/2008 Document Revised: 05/19/2016 Document Reviewed: 03/09/2015 °Elsevier Interactive Patient Education © 2018 [employer] Inc. ° °

## 2018-02-26 NOTE — Discharge Summary (Signed)
DISCHARGE SUMMARY  Rita [name]  MR#: [id number]  DOB:1966/10/05  Date of Admission: 02/21/2018 Date of Discharge: 02/26/2018  Attending Physician:Janan Bogie Rosina Lowenstein, MD  Patient's KXF:GHWEXH, Apolonio Schneiders, MD  Consults: Cardiology  Disposition: D/C home   Follow-up Appts: Follow-up Information    Caren Macadam, MD Follow up.   Specialty:  Family Medicine Why:  Keep your scheduled appointment this week as we discussed.   Contact information: [address] [place] [address] [phone]           Tests Needing Follow-up: -assess status of chest pain   Discharge Diagnoses: Abnormal nuclear med stress test - chest pain  LEFT subclavian artery occlusion Intractable nausea and vomiting - GERD/Hiatal hernia/chronic esophagitis  Hypokalemia HLD Tobacco abuse  Initial presentation: 52 y.o.F w/ a Hxof GERD with Barrett esophagus and esophagitis, HTN, Heart murmur, L subclavian stenosis, Chronic abdominal/back pain, Hiatal Hernia, Pancreatitis, and Tobacco use who presented w/ chest pain, nausea, and vomiting.  She underwent a nuclear med stress test as part of a preoperative cardiac evaluation (preparing for left carotid to subclavian bypass), and developed chest pain with nausea during the test. She vomiting once during the stress test, and went on to have many episodes of nonbloody vomiting afterwards.   In the ED EKG featured a sinus rhythm with right bundle branch block and no significant change from prior. Troponin was undetectable. CTA chest/abdomen/pelvis was negative for acute vascular findings, but notable for the known occlusion of the proximal left subclavian. Her chest pain persisted.    Hospital Course:  Abnormal nuclear med stress test - chest pain  -Intermediate risk study consistent with ischemia -Echocardiogram: WNL  -cardiac cath Monday 3/4 noted no signif CAD -CTA chest/abdomen/pelvis negative for overt vascular process / PE -Serial  cardiac enzymes negative -cleared for d/c home 3/4 - short term low dose narcotic provided w/ instructions to f/u w/ PCP for further evaluation of her ongoing acute pain (?GI etiology)  LEFT subclavian artery occlusion -Vascular Surgery planning for LEFT carotid to subclavian bypass after cardiac w/u - to f/u as outpt now that cardiac eval completed   Intractable nausea and vomiting - GERD/Hiatal hernia  -Patient has chronic nausea with frequent vomiting usually controlled with antiemetics at home, likely secondary to hiatal hernia -controlled at time of d/c w/ pt able to tolerated oral intake w/o difficulty   Hypokalemia -corrected w/ supplementation   HLD -Lipitor 40 mg daily  Tobacco abuse -Counseled at length on absolute requirement to discontinue use   Allergies as of 02/26/2018   No Known Allergies     Medication List    STOP taking these medications   acetaminophen-codeine 300-30 MG tablet Commonly known as:  TYLENOL #3   penicillin v potassium 500 MG tablet Commonly known as:  VEETID     TAKE these medications   amLODipine 5 MG tablet Commonly known as:  NORVASC Take 1 tablet (5 mg total) by mouth daily.   aspirin EC 81 MG tablet Take 1 tablet (81 mg total) by mouth daily.   atorvastatin 40 MG tablet Commonly known as:  LIPITOR Take 1 tablet (40 mg total) by mouth daily.   DEXILANT 60 MG capsule Generic drug:  dexlansoprazole Take 60 mg by mouth daily.   escitalopram 10 MG tablet Commonly known as:  LEXAPRO Take 1 tablet (10 mg total) by mouth daily.   ibuprofen 200 MG tablet Commonly known as:  ADVIL,MOTRIN Take 400 mg by mouth every 6 (six) hours  as needed for headache or moderate pain.   metoCLOPramide 5 MG tablet Commonly known as:  REGLAN Take 1 tablet (5 mg total) by mouth 3 (three) times daily before meals.   ondansetron 4 MG tablet Commonly known as:  ZOFRAN Take 1 tablet (4 mg total) by mouth every 8 (eight) hours as needed for  nausea or vomiting.   oxyCODONE 5 MG immediate release tablet Commonly known as:  Oxy IR/ROXICODONE Take 1-2 tablets (5-10 mg total) by mouth every 4 (four) hours as needed for severe pain.   potassium chloride SA 20 MEQ tablet Commonly known as:  K-DUR,KLOR-CON Take 20 mEq by mouth daily.   traZODone 50 MG tablet Commonly known as:  DESYREL Take 1 tablet (50 mg total) by mouth at bedtime.       Day of Discharge BP (!) 152/67 (BP Location: Left Leg)   Pulse 65   Temp 97.7 F (36.5 C) (Oral)   Resp 19   Ht 5\' 6"  (1.676 m)   Wt 83.2 kg (183 lb 6.4 oz)   LMP 02/09/2012   SpO2 96%   BMI 29.60 kg/m   Physical Exam: General: No acute respiratory distress Lungs: Clear to auscultation bilaterally without wheezes or crackles Cardiovascular: Regular rate and rhythm without murmur gallop or rub normal S1 and S2 Abdomen: Nontender, nondistended, soft, bowel sounds positive, no rebound, no ascites, no appreciable mass Extremities: No significant cyanosis, clubbing, or edema bilateral lower extremities  Basic Metabolic Panel: Recent Labs  Lab 02/21/18 1545 02/22/18 0357 02/22/18 1432 02/23/18 1150 02/25/18 0810 02/26/18 0500  NA 138 138  --  137 137 139  K 3.0* 2.6* 3.1* 3.2* 3.6 3.8  CL 103 101  --  107 103 105  CO2 15* 24  --  19* 25 25  GLUCOSE 150* 123*  --  83 89 99  BUN 11 5*  --  11 10 12   CREATININE 0.95 0.74  --  0.86 0.85 0.92  CALCIUM 9.1 8.3*  --  8.2* 8.7* 9.1  MG  --  2.2 2.1 2.1  --   --     Liver Function Tests: Recent Labs  Lab 02/21/18 1545  AST 28  ALT 19  ALKPHOS 72  BILITOT 0.8  PROT 7.5  ALBUMIN 4.2   Recent Labs  Lab 02/21/18 1545  LIPASE 33   Coags: Recent Labs  Lab 02/26/18 0500  INR 0.96   CBC: Recent Labs  Lab 02/21/18 1545 02/22/18 0357 02/23/18 0936 02/24/18 0219 02/25/18 0810 02/26/18 0500  WBC 12.6* 13.9* 8.0 8.8 8.5 7.5  NEUTROABS 10.8*  --   --   --   --   --   HGB 14.7 14.7 12.1 11.3* 11.0* 12.2  HCT 43.0  42.7 37.0 35.0* 32.6* 37.0  MCV 87.8 86.1 90.5 88.6 88.3 89.2  PLT 359 369 330 351 311 323    Cardiac Enzymes: Recent Labs  Lab 02/21/18 1545 02/21/18 2040 02/21/18 2307 02/22/18 0357 02/22/18 1432  TROPONINI <0.03 <0.03 <0.03 <0.03 <0.03    CBG: Recent Labs  Lab 02/24/18 1630 02/24/18 2118 02/25/18 0727 02/25/18 1136 02/25/18 1708  GLUCAP 106* 102* 104* 98 110*    Recent Results (from the past 240 hour(s))  Urine Culture     Status: Abnormal   Collection Time: 02/22/18  8:04 AM  Result Value Ref Range Status   Specimen Description URINE, RANDOM  Final   Special Requests NONE  Final   Culture (A)  Final    <  10,000 COLONIES/mL INSIGNIFICANT GROWTH Performed at [facility], [place] [address] [place], [place] [address]    Report Status 02/23/2018 FINAL  Final  Culture, blood (routine x 2)     Status: None (Preliminary result)   Collection Time: 02/22/18  2:50 PM  Result Value Ref Range Status   Specimen Description BLOOD LEFT ARM  Final   Special Requests IN PEDIATRIC BOTTLE Blood Culture adequate volume  Final   Culture   Final    NO GROWTH 4 DAYS Performed at [facility], [place] [address], [place], [place] [address]    Report Status PENDING  Incomplete  Culture, blood (routine x 2)     Status: None (Preliminary result)   Collection Time: 02/22/18  2:55 PM  Result Value Ref Range Status   Specimen Description BLOOD LEFT HAND  Final   Special Requests IN PEDIATRIC BOTTLE Blood Culture adequate volume  Final   Culture   Final    NO GROWTH 4 DAYS Performed at [facility], [place] [address], [place], [place] [address]    Report Status PENDING  Incomplete     Time spent in discharge (includes decision making & examination of pt): 30 minutes  02/26/2018, 5:52 PM   Cherene Altes, MD [facility] Office  [phone] Pager [phone]  On-Call/Text Page:      [email or web address]      password [facility]

## 2018-02-26 NOTE — Progress Notes (Signed)
Patient arrived from cath lab in NAD. VS stable and TR band site level 0.

## 2018-02-26 NOTE — Research (Signed)
CAD FEM Informed Consent           Subject Name: Rita [name]. Rita [name]     Subject met inclusion and exclusion criteria.  The informed consent form, study requirements and expectations were reviewed with the subject and questions and concerns were addressed prior to the signing of the consent form.  The subject verbalized understanding of the trial requirements.  The subject agreed to participate in the CAD FEM trial and signed the informed consent.  The informed consent was obtained prior to performance of any protocol-specific procedures for the subject.  A copy of the signed informed consent was given to the subject and a copy was placed in the subject's medical record.   [name] ,Research Assistant 02-26-2018 08:25 a.m.

## 2018-02-26 NOTE — Progress Notes (Signed)
ANTICOAGULATION CONSULT NOTE - Follow Up Consult  Pharmacy Consult for Heparin Indication: chest pain/ACS  No Known Allergies  Patient Measurements: Height: 5\' 6"  (167.6 cm) Weight: 183 lb 6.4 oz (83.2 kg) IBW/kg (Calculated) : 59.3 Heparin Dosing Weight: 77 kg  Vital Signs: Temp: 97.7 F (36.5 C) (03/04 0518) Temp Source: Oral (03/04 0518) BP: 112/64 (03/04 0518) Pulse Rate: 55 (03/04 0518)  Labs: Recent Labs    02/23/18 1150  02/24/18 0219  02/24/18 1859 02/25/18 0810 02/26/18 0500  HGB  --    < > 11.3*  --   --  11.0* 12.2  HCT  --   --  35.0*  --   --  32.6* 37.0  PLT  --   --  351  --   --  311 323  LABPROT  --   --   --   --   --   --  12.7  INR  --   --   --   --   --   --  0.96  HEPARINUNFRC  --   --  0.20*   < > 0.47 0.33 0.48  CREATININE 0.86  --   --   --   --  0.85 0.92   < > = values in this interval not displayed.    Estimated Creatinine Clearance: 77.8 mL/min (by C-G formula based on SCr of 0.92 mg/dL).  Assessment:  Pt presented 02/22/18 with chest pain, nausea, and vomiting, with positive nuclear med stress test  As part of pre-op evaluation for left carotid to subclavian bypass. Pharmacy consulted to start heparin. Not on anticoagulation PTA.     Heparin level is therapeutic (0.48) on 1550 units/hr.     Planning cardiac cath today.  Goal of Therapy:  Heparin level 0.3-0.7 units/ml Monitor platelets by anticoagulation protocol: Yes   Plan:   Continue heparin drip at 1550 units/hr  Daily heparin level and CBC while on heparin.  Follow up post-cath.  Arty Baumgartner , [place] Pager: [phone] or [phone] 02/26/2018,10:29 AM

## 2018-02-26 NOTE — Progress Notes (Addendum)
Progress Note  Patient Name: Rita [name] Date of Encounter: 02/26/2018  Primary Cardiologist: No primary care provider on file.  Subjective   Some chest tightness this morning. Sitting comfortably in the bed. Family at bedside.   Inpatient Medications    Scheduled Meds: . amLODipine  5 mg Oral Daily  . aspirin EC  81 mg Oral Daily  . atorvastatin  40 mg Oral q1800  . escitalopram  10 mg Oral Daily  . famotidine  20 mg Oral BID  . feeding supplement (ENSURE ENLIVE)  237 mL Oral BID BM  . metoCLOPramide (REGLAN) injection  10 mg Intravenous Q8H  . metoprolol tartrate  12.5 mg Oral BID  . multivitamin with minerals  1 tablet Oral Daily  . nitroGLYCERIN  1 inch Topical Q8H  . pantoprazole  40 mg Oral Daily  . sodium chloride flush  3 mL Intravenous Q12H  . traZODone  50 mg Oral QHS   Continuous Infusions: . sodium chloride    . sodium chloride 1 mL/kg/hr (02/26/18 1009)  . heparin 1,550 Units/hr (02/26/18 1009)   PRN Meds: sodium chloride, acetaminophen, hydrALAZINE, morphine injection, nitroGLYCERIN, ondansetron (ZOFRAN) IV, oxyCODONE, promethazine, sodium chloride flush   Vital Signs    Vitals:   02/25/18 1716 02/25/18 2004 02/25/18 2127 02/26/18 0518  BP: 128/67 126/70 132/77 112/64  Pulse: 63 60 60 (!) 55  Resp: 18     Temp: 98.6 F (37 C) 98 F (36.7 C)  97.7 F (36.5 C)  TempSrc: Oral Oral  Oral  SpO2: 98% 97%  98%  Weight:    183 lb 6.4 oz (83.2 kg)  Height:        Intake/Output Summary (Last 24 hours) at 02/26/2018 1032 Last data filed at 02/26/2018 0900 Gross per 24 hour  Intake 1497 ml  Output -  Net 1497 ml   Filed Weights   02/24/18 0543 02/25/18 0508 02/26/18 0518  Weight: 182 lb 12.8 oz (82.9 kg) 183 lb 9.6 oz (83.3 kg) 183 lb 6.4 oz (83.2 kg)    Telemetry    SR - Personally Reviewed  ECG    N/a - Personally Reviewed  Physical Exam   General: Well developed, well nourished, female appearing in no acute distress. Head:  Normocephalic, atraumatic.  Neck: Supple, no JVD. Lungs:  Resp regular and unlabored, CTA. Heart: RRR, S1, S2, no S3, S4, or murmur; no rub. Abdomen: Soft, non-tender, non-distended with normoactive bowel sounds.  Extremities: No clubbing, cyanosis, edema. Distal pedal pulses are 2+ bilaterally. Neuro: Alert and oriented X 3. Moves all extremities spontaneously. Psych: Normal affect.  Labs    Chemistry Recent Labs  Lab 02/21/18 1545  02/23/18 1150 02/25/18 0810 02/26/18 0500  NA 138   < > 137 137 139  K 3.0*   < > 3.2* 3.6 3.8  CL 103   < > 107 103 105  CO2 15*   < > 19* 25 25  GLUCOSE 150*   < > 83 89 99  BUN 11   < > 11 10 12   CREATININE 0.95   < > 0.86 0.85 0.92  CALCIUM 9.1   < > 8.2* 8.7* 9.1  PROT 7.5  --   --   --   --   ALBUMIN 4.2  --   --   --   --   AST 28  --   --   --   --   ALT 19  --   --   --   --  ALKPHOS 72  --   --   --   --   BILITOT 0.8  --   --   --   --   GFRNONAA >60   < > >60 >60 >60  GFRAA >60   < > >60 >60 >60  ANIONGAP 20*   < > 11 9 9    < > = values in this interval not displayed.     Hematology Recent Labs  Lab 02/24/18 0219 02/25/18 0810 02/26/18 0500  WBC 8.8 8.5 7.5  RBC 3.95 3.69* 4.15  HGB 11.3* 11.0* 12.2  HCT 35.0* 32.6* 37.0  MCV 88.6 88.3 89.2  MCH 28.6 29.8 29.4  MCHC 32.3 33.7 33.0  RDW 14.1 13.9 14.3  PLT 351 311 323    Cardiac Enzymes Recent Labs  Lab 02/21/18 2040 02/21/18 2307 02/22/18 0357 02/22/18 1432  TROPONINI <0.03 <0.03 <0.03 <0.03   No results for input(s): TROPIPOC in the last 168 hours.   BNPNo results for input(s): BNP, PROBNP in the last 168 hours.   DDimer No results for input(s): DDIMER in the last 168 hours.   Lipid Panel     Component Value Date/Time   CHOL 272 (H) 01/17/2018 1153   TRIG 348 (H) 01/17/2018 1153   HDL 42 (L) 01/17/2018 1153   CHOLHDL 6.5 (H) 01/17/2018 1153   VLDL 32 01/01/2017 0552   LDLCALC 98 01/01/2017 0552    Radiology    No results found.  Cardiac  Studies   TTE: 02/23/18   Study Conclusions  - Left ventricle: The cavity size was normal. Systolic function was   normal. The estimated ejection fraction was in the range of 55%   to 60%. Wall motion was normal; there were no regional wall   motion abnormalities. The study is not technically sufficient to   allow evaluation of LV diastolic function. - Aortic valve: Transvalvular velocity was within the normal range.   There was no stenosis. There was no regurgitation. - Mitral valve: Transvalvular velocity was within the normal range.   There was no evidence for stenosis. There was trivial   regurgitation. - Left atrium: The atrium was moderately dilated. - Right ventricle: The cavity size was normal. Wall thickness was   normal. Systolic function was normal. - Atrial septum: No defect or patent foramen ovale was identified   by color flow Doppler. - Pulmonary arteries: Systolic pressure was within the normal   range.  Patient Profile     52 y.o. female with a hx of COPD, hypertension, hyperlipidemia, Barrett's esophagus with esophagitis, peptic ulcer disease, ongoing tobacco abuse and left subclavian stenosis who was seen after an abnormal stress test.   Assessment & Plan    1. Chest pain/unstable angina: -Mixed symptoms.  She had tender chest wall however her chest tightness/pressure was different.   -Her cardiac risk factor includes hyperlipidemia, hypertension, ongoing tobacco smoking and family history of CAD. Troponin has been remained negative. -Echo showed reassuring EF. Stress test medium defect of moderate severity present in the mid anterior, mid anteroseptal, apical anterior and apical septal location. Findings consistent with ischemia. - planned for cardiac cath today  2.Left subclavian artery occlusion: -Follows with vascular surgery, planning for left carotid-to-subclavian bypass once cardiac clearnace.  3.  Intractable nausea and vomiting: -Has a chronic  GI issue, stable this morning.    4. HLD: - 01/17/2018: Cholesterol 272; HDL 42; Triglycerides 348 . LDL 171. - Continue lipitor 40mg  qd.   5. Tobacco Use:  cessation advised    Signed, Reino Bellis, NP  02/26/2018, 10:32 AM  Pager # [phone]   For questions or updates, please contact [place] Please consult [email or web address] for contact info under Cardiology/STEMI.  Patient seen and examined. Agree with assessment and plan.Admits to recurrent chest pain since in hospital. Abnormal nuclear study suggestive of LAD ischemia. ECG with SB at 56, RBBB with repolarization changes and PACs.  High-grade stenosis or focal short segment occlusion of the proximal left subclavian artery, proximal to the vertebral artery predisposing to subclavian steal syndrome. Planning for left carotid-to-subclavian bypass. Discussed smoking cessation. Significant mixed hyperlipidemia with atherogenic dyslipidemic profile with TC 272, TG 378 and LDL 98. Increase atorvastatin to 80 mg; consider vascepa 2 grams bid per Recuce-it trial data. Needs improved diet. Plan cath with possible PCI today.  I have reviewed the risks, indications, and alternatives to cardiac catheterization, possible angioplasty, and stenting with the patient. Risks include but are not limited to bleeding, infection, vascular injury, stroke, myocardial infection, arrhythmia, kidney injury, radiation-related injury in the case of prolonged fluoroscopy use, emergency cardiac surgery, and death. The patient understands the risks of serious complication is 1-2 in 2956 with diagnostic cardiac cath and 1-2% or less with angioplasty/stenting.      Troy Sine, MD, [facility] 02/26/2018 11:01 AM

## 2018-02-26 NOTE — Interval H&P Note (Signed)
History and Physical Interval Note:  02/26/2018 1:13 PM  Rita [name]  has presented today for cardiac cath with the diagnosis of unstable angina/abnormal stress test  The various methods of treatment have been discussed with the patient and family. After consideration of risks, benefits and other options for treatment, the patient has consented to  Procedure(s): LEFT HEART CATH AND CORONARY ANGIOGRAPHY (N/A) as a surgical intervention .  The patient's history has been reviewed, patient examined, no change in status, stable for surgery.  I have reviewed the patient's chart and labs.  Questions were answered to the patient's satisfaction.    Cath Lab Visit (complete for each Cath Lab visit)  Clinical Evaluation Leading to the Procedure:   ACS: No.  Non-ACS:    Anginal Classification: CCS III  Anti-ischemic medical therapy: Minimal Therapy (1 class of medications)  Non-Invasive Test Results: Intermediate-risk stress test findings: cardiac mortality 1-3%/year  Prior CABG: No previous CABG        Lauree Chandler

## 2018-02-26 NOTE — Progress Notes (Signed)
Patient off floor to cath lab.  

## 2018-02-27 ENCOUNTER — Telehealth: Payer: Self-pay | Admitting: Family Medicine

## 2018-02-27 ENCOUNTER — Encounter (HOSPITAL_COMMUNITY): Payer: Self-pay | Admitting: Cardiovascular Disease

## 2018-02-27 ENCOUNTER — Telehealth: Payer: Self-pay

## 2018-02-27 LAB — CULTURE, BLOOD (ROUTINE X 2)
Culture: NO GROWTH
Culture: NO GROWTH
Special Requests: ADEQUATE
Special Requests: ADEQUATE

## 2018-02-27 NOTE — Telephone Encounter (Signed)
[facility] to do [facility]- patient was unavailable to come to the phone. She will return my call later.

## 2018-02-27 NOTE — Telephone Encounter (Signed)
Please call pt for a post hospital discharge appointment.

## 2018-02-27 NOTE — Telephone Encounter (Signed)
Called patient regarding message below. No answer, unable to leave message.  

## 2018-02-27 NOTE — Telephone Encounter (Signed)
I have attempted to call patient once today. I will continue trying.

## 2018-02-28 NOTE — Telephone Encounter (Signed)
Called patient regarding message below. No answer, unable to leave message.  

## 2018-02-28 NOTE — Telephone Encounter (Signed)
Transition Care Management Follow-up Telephone Call   Date discharged?  02/26/18              How have you been since you were released from the hospital? [facility] states she is still not 100%, still hurting throughout her chest, arm is still tired.    Do you understand why you were in the hospital? Yes   Do you understand the discharge instructions? Yes   Where were you discharged to? Home   Items Reviewed:  Medications reviewed: Yes  Allergies reviewed: Yes  Dietary changes reviewed: Yes- was told to drink Ensure  Referrals reviewed: Yes   Functional Questionnaire:   Activities of Daily Living (ADLs):  Patient is independent    Any transportation issues/concerns?: No.    Any patient concerns? No.    Confirmed importance and date/time of follow-up visits scheduled    Yes  Confirmed with patient if condition begins to worsen call PCP or go to the ER.  Patient was given the office number and encouraged to call back with question or concerns.  : Yes

## 2018-03-01 ENCOUNTER — Telehealth: Payer: Self-pay

## 2018-03-01 DIAGNOSIS — R110 Nausea: Secondary | ICD-10-CM

## 2018-03-01 MED ORDER — ONDANSETRON HCL 4 MG PO TABS: 4.0000 mg | ORAL_TABLET | Freq: Three times a day (TID) | ORAL | 3 refills | Status: DC | PRN

## 2018-03-01 NOTE — Addendum Note (Signed)
Addended by: Annitta Needs on: 03/01/2018 08:00 PM   Modules accepted: Orders

## 2018-03-01 NOTE — Telephone Encounter (Signed)
Pt walked in office to get Dexilant samples. Pt is taking it once daily. Pt also asked about a nausea medication. Pt said she has gotten one from our office previously.  Pt was seen and admitted at [facility] for a few days due to the Dr. Monitoring heart attack symptoms. Pt said she was clear of a heart attack but has 90 % blockage in an artery. Pt asked the [facility] Physician to refill the nausea medication and pt was asked to contact our office for additional refill of nausea meds.

## 2018-03-06 ENCOUNTER — Other Ambulatory Visit: Payer: Self-pay

## 2018-03-06 ENCOUNTER — Encounter: Payer: Self-pay | Admitting: Family Medicine

## 2018-03-06 ENCOUNTER — Other Ambulatory Visit: Payer: Self-pay | Admitting: *Deleted

## 2018-03-06 ENCOUNTER — Ambulatory Visit (INDEPENDENT_AMBULATORY_CARE_PROVIDER_SITE_OTHER): Payer: Medicare Other | Admitting: Family Medicine

## 2018-03-06 VITALS — BP 110/64 | HR 95 | Temp 97.4°F | Resp 16 | Ht 66.0 in | Wt 186.2 lb

## 2018-03-06 DIAGNOSIS — G458 Other transient cerebral ischemic attacks and related syndromes: Secondary | ICD-10-CM | POA: Diagnosis not present

## 2018-03-06 DIAGNOSIS — F99 Mental disorder, not otherwise specified: Secondary | ICD-10-CM

## 2018-03-06 DIAGNOSIS — K3184 Gastroparesis: Secondary | ICD-10-CM

## 2018-03-06 DIAGNOSIS — I10 Essential (primary) hypertension: Secondary | ICD-10-CM | POA: Diagnosis not present

## 2018-03-06 DIAGNOSIS — F5105 Insomnia due to other mental disorder: Secondary | ICD-10-CM | POA: Diagnosis not present

## 2018-03-06 DIAGNOSIS — Z1231 Encounter for screening mammogram for malignant neoplasm of breast: Secondary | ICD-10-CM | POA: Diagnosis not present

## 2018-03-06 DIAGNOSIS — E785 Hyperlipidemia, unspecified: Secondary | ICD-10-CM

## 2018-03-06 DIAGNOSIS — F321 Major depressive disorder, single episode, moderate: Secondary | ICD-10-CM | POA: Diagnosis not present

## 2018-03-06 DIAGNOSIS — Z1239 Encounter for other screening for malignant neoplasm of breast: Secondary | ICD-10-CM

## 2018-03-06 MED ORDER — ESCITALOPRAM OXALATE 10 MG PO TABS: 10.0000 mg | ORAL_TABLET | Freq: Every day | ORAL | 0 refills | Status: DC

## 2018-03-06 MED ORDER — TRAZODONE HCL 50 MG PO TABS: 50.0000 mg | ORAL_TABLET | Freq: Every day | ORAL | 3 refills | Status: DC

## 2018-03-06 MED ORDER — AMLODIPINE BESYLATE 5 MG PO TABS: 5.0000 mg | ORAL_TABLET | Freq: Every day | ORAL | 0 refills | Status: DC

## 2018-03-06 MED ORDER — ATORVASTATIN CALCIUM 40 MG PO TABS: 40.0000 mg | ORAL_TABLET | Freq: Every day | ORAL | 3 refills | Status: DC

## 2018-03-06 MED ORDER — OXYCODONE HCL 5 MG PO TABS
5.0000 mg | ORAL_TABLET | ORAL | 0 refills | Status: DC | PRN
Start: 2018-03-06 — End: 2018-03-13

## 2018-03-06 MED ORDER — ASPIRIN EC 81 MG PO TBEC: 81.0000 mg | DELAYED_RELEASE_TABLET | Freq: Every day | ORAL | Status: DC

## 2018-03-06 MED ORDER — METOCLOPRAMIDE HCL 5 MG PO TABS: 5.0000 mg | ORAL_TABLET | Freq: Three times a day (TID) | ORAL | 0 refills | Status: DC

## 2018-03-06 NOTE — Patient Instructions (Signed)
Schedule mammogram at check-out

## 2018-03-06 NOTE — Progress Notes (Signed)
Patient ID: Rita [name], female    DOB: 03/16/1966, 52 y.o.   MRN: [id number]  Chief Complaint  Patient presents with  . Follow-up    Allergies Patient has no known allergies.  Subjective:   [name] is a 52 y.o. female who presents to [facility] today.  HPI Rita [name] presents today for TOC visit/follow up from hospital.  Her hospital discharge summary is reviewed today by myself and discussed with her.  She was contacted via phone on 02/26/2018 for her telephone encounter for transition of care services.  She has been taking the oxycodone for pain since she was discharged.     Was discharged home on 02/26/18.  Patient went in to the hospital for cardiac stress test.  Developed subsequent intractable nausea and vomiting during the stress test.  Was subsequently admitted and had a cardiac catheterization performed for evaluation of her symptoms. This was done as part of a preop evaluation for upcoming subclavian artery stenosis surgery.    She reports that since she has been home has felt terrible and does not feel great. Reports that has not been vomiting other than occasional. Reports that she does stay nauseated. Repots that her left arm and left jaw hurt terrible.  Reports that the pain is been constant since she was in the hospital and was subsequently discharged.  Has to dangle left arm b/c of the pain and has to shake it to help get rid of the pain reports that her left arm feels dead. Wants to go ahead and get her surgery b/c she believes that all her symptoms are due to the the subclavian thrombosis.   Reports that has pain in jaw and arm which is worse at night. Given pain pills, oxycodone when she was discharged from the Ed. she reports that the pain pills do help with the pain to some degree.  She reports that she has had four kids vaginally and never had pain like this. Reports that was given morphine in IV while in the hospital and it did not touch her pain. Then  reports that hey gave her oxycodone and it helped her pain somewhat.  She reports that she has not taken any pain pills today.  She reports that she is ready to get her surgery completed and feel back to normal.  She reports that she dose not have an upcoming appointment with Dr. Su Monks surgeon who will be performing her upcoming surgery.  She does request that she needs refills on her other prescriptions today.  Reports she has been taking her blood pressure medicine, cholesterol medicine, and medication for her mood.  She reports that she does believe the medications helped her mood and she does not feel depressed.  She reports that she is just eager to get the surgery over with him feel back to her normal self.    Past Medical History:  Diagnosis Date  . Arthritis    "qwhere" (02/22/2018)  . Barrett's esophagus with esophagitis 03/26/2013  . Chronic abdominal pain   . Chronic lower back pain   . Chronic pancreatitis ([place])   . COPD (chronic obstructive pulmonary disease) ([place])   . Depression   . GERD (gastroesophageal reflux disease)   . Heart murmur   . Hiatal hernia   . High cholesterol   . Hypertension   . Peptic ulcer   . Pneumonia 2000s X 1  . Tobacco use 03/26/2013    Past Surgical History:  Procedure  Laterality Date  . AORTIC ARCH ANGIOGRAPHY N/A 01/19/2018   Procedure: AORTIC ARCH ANGIOGRAPHY;  Surgeon: Elam Dutch, MD;  Location: [place] [facility];  Service: Cardiovascular;  Laterality: N/A;  . BIOPSY  04/17/2017   Procedure: BIOPSY;  Surgeon: Daneil Dolin, MD;  [facility];  Service: Endoscopy;;  esophageal  . [name]; 1988  . COLONOSCOPY WITH PROPOFOL N/A 04/17/2017   Procedure: COLONOSCOPY WITH PROPOFOL;  Surgeon: Daneil Dolin, MD;  [facility];  Service: Endoscopy;  Laterality: N/A;  100  . ESOPHAGOGASTRODUODENOSCOPY  Oct 2011   Dr. Laural Golden: ulcer in distal esophagus, soft stricture at GE junction s/p balloon  dilation PATH: BARRETT'S  . ESOPHAGOGASTRODUODENOSCOPY (EGD) WITH ESOPHAGEAL DILATION N/A 03/27/2013   Procedure: ESOPHAGOGASTRODUODENOSCOPY (EGD) WITH ESOPHAGEAL DILATION;  Surgeon: Daneil Dolin, MD;  [facility];  Service: Endoscopy;  Laterality: N/A;  possible dilation  . ESOPHAGOGASTRODUODENOSCOPY (EGD) WITH PROPOFOL N/A 01/02/2017   Procedure: ESOPHAGOGASTRODUODENOSCOPY (EGD) WITH PROPOFOL;  Surgeon: Daneil Dolin, MD;  [facility];  Service: Endoscopy;  Laterality: N/A;  with possible esophageal dilation  . ESOPHAGOGASTRODUODENOSCOPY (EGD) WITH PROPOFOL N/A 04/17/2017   Procedure: ESOPHAGOGASTRODUODENOSCOPY (EGD) WITH PROPOFOL;  Surgeon: Daneil Dolin, MD;  [facility];  Service: Endoscopy;  Laterality: N/A;  . EUS N/A 05/09/2013   Procedure: UPPER ENDOSCOPIC ULTRASOUND (EUS) LINEAR;  Surgeon: Rita Banister, MD;  [facility]: [facility];  Service: Endoscopy;  Laterality: N/A;  . FRACTURE SURGERY    . LAPAROSCOPIC CHOLECYSTECTOMY    . LEFT HEART CATH AND CORONARY ANGIOGRAPHY N/A 02/26/2018   Procedure: LEFT HEART CATH AND CORONARY ANGIOGRAPHY;  Surgeon: Burnell Blanks, MD;  Location: [place] [facility];  Service: Cardiovascular;  Laterality: N/A;  . Rita [name] DILATION N/A 04/17/2017   Procedure: Rita [name] DILATION;  Surgeon: Daneil Dolin, MD;  [facility];  Service: Endoscopy;  Laterality: N/A;  . PATELLA FRACTURE SURGERY Left   . POLYPECTOMY  04/17/2017   Procedure: POLYPECTOMY;  Surgeon: Daneil Dolin, MD;  [facility];  Service: Endoscopy;;  . TUBAL LIGATION  1992  . UPPER EXTREMITY ANGIOGRAPHY N/A 01/19/2018   Procedure: UPPER EXTREMITY ANGIOGRAPHY;  Surgeon: Elam Dutch, MD;  Location: [place] [facility];  Service: Cardiovascular;  Laterality: N/A;    Family History  Problem Relation Age of Onset  . Asthma Mother   . Heart failure Mother   . Cancer Mother        pancreatic  . Diabetes Mother   . Hypertension  Mother   . Stroke Mother   . Pancreatic cancer Mother        deceased  . Heart failure Father   . Diabetes Father   . Colon cancer Neg Hx      Social History   Socioeconomic History  . Marital status: Divorced    Spouse name: None  . Number of children: None  . Years of education: None  . Highest education level: None  Social Needs  . Financial resource strain: None  . Food insecurity - worry: None  . Food insecurity - inability: None  . Transportation needs - medical: None  . Transportation needs - non-medical: None  Occupational History  . Occupation: farm  Tobacco Use  . Smoking status: Current Every Day Smoker    Packs/day: 0.50    Years: 41.00    Pack years: 20.50    Types: Cigarettes  Start date: 09/26/1982  . Smokeless tobacco: Never Used  Substance and Sexual Activity  . Alcohol use: No  . Drug use: No  . Sexual activity: Not Currently    Partners: Male    Birth control/protection: Surgical  Other Topics Concern  . None  Social History Narrative   Lives in [place], [place].   Is on disability, was a Psychologist, sport and exercise and drove tractors.    Wears seatbelt.   Cannot eat meat due to esophagus.   Smokes cigarettes.   Drinks sodas, 1/2 liter a day.    3 boys and 1 girl. 2 boys live in [place]. 1 son died in 39th month of pregnancy.   Live with friend and his wife at this time.   Used to be married and was abandoned, he would not give her a divorce.       Review of Systems  Constitutional: Negative for activity change, appetite change and fever.  HENT: Negative for congestion, dental problem, nosebleeds, postnasal drip, rhinorrhea, sinus pressure, sinus pain, trouble swallowing and voice change.   Eyes: Negative for visual disturbance.  Respiratory: Negative for cough, chest tightness and shortness of breath.   Cardiovascular: Negative for chest pain, palpitations and leg swelling.  Gastrointestinal: Positive for nausea. Negative for abdominal pain, constipation,  diarrhea and vomiting.  Genitourinary: Negative for dysuria, frequency and urgency.  Musculoskeletal: Negative for back pain and neck pain.  Skin: Negative for rash.  Neurological: Negative for dizziness, syncope, light-headedness, numbness and headaches.  Hematological: Negative for adenopathy.  Psychiatric/Behavioral: Negative for behavioral problems, confusion, decreased concentration, hallucinations, self-injury, sleep disturbance and suicidal ideas. The patient is not nervous/anxious and is not hyperactive.      Objective:   BP 110/64 (BP Location: Left Arm, Patient Position: Sitting, Cuff Size: Normal)   Pulse 95   Temp (!) 97.4 F (36.3 C) (Temporal)   Resp 16   Ht 5\' 6"  (1.676 m)   Wt 186 lb 4 oz (84.5 kg)   LMP 02/09/2012   SpO2 98%   BMI 30.06 kg/m   Physical Exam  Constitutional: She is oriented to person, place, and time. She appears well-developed.  Patient sitting in exam room.  Appearance older than her stated age, in obvious pain.  HENT:  Head: Normocephalic and atraumatic.  Eyes: Conjunctivae and EOM are normal. Pupils are equal, round, and reactive to light. No scleral icterus.  Neck: Normal range of motion. Neck supple. No JVD present. No tracheal deviation present.  Pulmonary/Chest: Effort normal and breath sounds normal. No stridor. No respiratory distress.  Abdominal: Soft. Bowel sounds are normal. She exhibits no distension.  Musculoskeletal:  Upper and lower extremities warm bilaterally.  Neurological: She is alert and oriented to person, place, and time. No cranial nerve deficit.  Skin: Skin is warm and dry. Capillary refill takes less than 2 seconds. No rash noted. No erythema.  Psychiatric: She has a normal mood and affect. Her behavior is normal. Judgment and thought content normal.  Vitals reviewed.    Assessment and Plan  1. Subclavian steal syndrome Called and spoke with Zigmund Daniel at Dr. Oneida Alar office.  Office was updated on patient condition and  the fact that she has had negative cardiac catheterization.  Zigmund Daniel will contact patient and plan on setting up surgery within the next 1-2 Weeks Pending Doctor Fld. schedule. - aspirin EC 81 MG tablet; Take 1 tablet (81 mg total) by mouth daily.  2. Hypertension, unspecified type Stable.  Continue medications as directed. - amLODipine (  NORVASC) 5 MG tablet; Take 1 tablet (5 mg total) by mouth daily.  Dispense: 30 tablet; Refill: 0  3. Hyperlipidemia LDL goal <70 Any medications as directed.  Plan to recheck labs in 3 months peer - atorvastatin (LIPITOR) 40 MG tablet; Take 1 tablet (40 mg total) by mouth daily.  Dispense: 90 tablet; Refill: 3  4. Depression, major, single episode, moderate ([place]) Patient counseled in detail regarding the risks of medication. Told to call or return to clinic if develop any worrisome signs or symptoms. Patient voiced understanding.  Suicide risks evaluated and documented in note if present or in the area below.  Patient has protective factors of family and community support.  Patient reports that family believes is behaving rationally. Patient displays problem solving skills.   Patient specifically denies suicide ideation. Patient has access/information to healthcare contacts if situation or mood changes where patient is a risk to self or others or mood becomes unstable.   During the encounter, the patient had good eye contact and firm handshake regarding safety contract and agreement to seek help if mood worsens and not to harm self.   Patient understands the treatment plan and is in agreement. Agrees to keep follow up and call prior or return to clinic if needed.   - escitalopram (LEXAPRO) 10 MG tablet; Take 1 tablet (10 mg total) by mouth daily.  Dispense: 30 tablet; Refill: 0  5. Gastroparesis Patient has chronic history of gastroparesis with associated nausea and vomiting, followed by Dr. Buford Dresser.  Medications refilled.  Please keep scheduled appointments  with GI. - metoCLOPramide (REGLAN) 5 MG tablet; Take 1 tablet (5 mg total) by mouth 3 (three) times daily before meals.  Dispense: 90 tablet; Refill: 0  6. Insomnia due to other mental disorder To any medication as directed.  Sleep hygiene discussed. - traZODone (DESYREL) 50 MG tablet; Take 1 tablet (50 mg total) by mouth at bedtime.  Dispense: 30 tablet; Refill: 3  7. Screening for breast cancer Place referral.  Patient requests to wait until after surgery to perform screening. - [facility] Digital Screening; Future  Return in about 4 weeks (around 04/03/2018). Caren Macadam, MD 03/06/2018

## 2018-03-08 ENCOUNTER — Ambulatory Visit: Payer: Medicare Other | Admitting: Cardiovascular Disease

## 2018-03-12 ENCOUNTER — Encounter: Payer: Self-pay | Admitting: Cardiovascular Disease

## 2018-03-12 ENCOUNTER — Ambulatory Visit (INDEPENDENT_AMBULATORY_CARE_PROVIDER_SITE_OTHER): Payer: Medicare Other | Admitting: Cardiovascular Disease

## 2018-03-12 VITALS — BP 110/72 | HR 76 | Ht 67.0 in | Wt 188.0 lb

## 2018-03-12 DIAGNOSIS — E785 Hyperlipidemia, unspecified: Secondary | ICD-10-CM | POA: Diagnosis not present

## 2018-03-12 DIAGNOSIS — I10 Essential (primary) hypertension: Secondary | ICD-10-CM

## 2018-03-12 DIAGNOSIS — G458 Other transient cerebral ischemic attacks and related syndromes: Secondary | ICD-10-CM

## 2018-03-12 DIAGNOSIS — I739 Peripheral vascular disease, unspecified: Secondary | ICD-10-CM | POA: Diagnosis not present

## 2018-03-12 DIAGNOSIS — I6529 Occlusion and stenosis of unspecified carotid artery: Secondary | ICD-10-CM | POA: Diagnosis not present

## 2018-03-12 NOTE — Patient Instructions (Signed)
Medication Instructions:  Continue all current medications.  Labwork: none  Testing/Procedures: none  Follow-Up: As needed.    Any Other Special Instructions Will Be Listed Below (If Applicable).  If you need a refill on your cardiac medications before your next appointment, please call your pharmacy.  

## 2018-03-12 NOTE — Progress Notes (Signed)
SUBJECTIVE: The patient presents for follow-up.  She underwent cardiac catheterization on 02/27/18 which showed no evidence of coronary disease.  Echocardiogram 02/23/18 showed normal left ventricular systolic function and regional wall motion, LVEF 55-60%.  There was moderate left atrial dilatation.  There was no significant valvular regurgitation.  An echocardiogram on 02/15/18 demonstrated mild mitral and tricuspid regurgitation.  She was evaluated by Dr. Oneida Alar on 01/19/18 and underwent aortic arch angiography.  There was noted to be occlusion of the left subclavian artery.  She has scheduled to undergo left carotid to subclavian bypass surgery on 03/19/18.  She describes right sided breast pain which radiates to the left breast and then around to the back.  She normally has lower back pain but has had some upper back pain more recently.  She denies exertional chest pain.  Most symptoms occur at rest.  She feels very fatigued.  It now hurts to pick up her purse.  She just came back from attending her father's funeral.    Review of Systems: As per "subjective", otherwise negative.  No Known Allergies  Current Outpatient Medications  Medication Sig Dispense Refill  . amLODipine (NORVASC) 5 MG tablet Take 1 tablet (5 mg total) by mouth daily. (Patient taking differently: Take 5 mg by mouth at bedtime. ) 30 tablet 0  . aspirin EC 81 MG tablet Take 1 tablet (81 mg total) by mouth daily. (Patient taking differently: Take 81 mg by mouth at bedtime. )    . atorvastatin (LIPITOR) 40 MG tablet Take 1 tablet (40 mg total) by mouth daily. (Patient taking differently: Take 40 mg by mouth at bedtime. ) 90 tablet 3  . dexlansoprazole (DEXILANT) 60 MG capsule Take 60 mg by mouth at bedtime.     Marland Kitchen escitalopram (LEXAPRO) 10 MG tablet Take 1 tablet (10 mg total) by mouth daily. (Patient taking differently: Take 10 mg by mouth at bedtime. ) 30 tablet 0  . metoCLOPramide (REGLAN) 5 MG tablet Take 1 tablet  (5 mg total) by mouth 3 (three) times daily before meals. 90 tablet 0  . ondansetron (ZOFRAN) 4 MG tablet Take 1 tablet (4 mg total) by mouth every 8 (eight) hours as needed for nausea or vomiting. 30 tablet 3  . oxyCODONE (OXY IR/ROXICODONE) 5 MG immediate release tablet Take 1 tablet (5 mg total) by mouth every 4 (four) hours as needed for severe pain. 40 tablet 0  . potassium chloride SA (K-DUR,KLOR-CON) 20 MEQ tablet Take 20 mEq by mouth at bedtime.     . traZODone (DESYREL) 50 MG tablet Take 1 tablet (50 mg total) by mouth at bedtime. 30 tablet 3   No current facility-administered medications for this visit.     Past Medical History:  Diagnosis Date  . Arthritis    "qwhere" (02/22/2018)  . Barrett's esophagus with esophagitis 03/26/2013  . Chronic abdominal pain   . Chronic lower back pain   . Chronic pancreatitis ([place])   . COPD (chronic obstructive pulmonary disease) ([place])   . Depression   . GERD (gastroesophageal reflux disease)   . Heart murmur   . Hiatal hernia   . High cholesterol   . Hypertension   . Peptic ulcer   . Pneumonia 2000s X 1  . Tobacco use 03/26/2013    Past Surgical History:  Procedure Laterality Date  . AORTIC ARCH ANGIOGRAPHY N/A 01/19/2018   Procedure: AORTIC ARCH ANGIOGRAPHY;  Surgeon: Elam Dutch, MD;  Location: [facility];  Service: Cardiovascular;  Laterality: N/A;  . BIOPSY  04/17/2017   Procedure: BIOPSY;  Surgeon: Daneil Dolin, MD;  [facility];  Service: Endoscopy;;  esophageal  . [place]; 1988  . COLONOSCOPY WITH PROPOFOL N/A 04/17/2017   Procedure: COLONOSCOPY WITH PROPOFOL;  Surgeon: Daneil Dolin, MD;  [facility];  Service: Endoscopy;  Laterality: N/A;  100  . ESOPHAGOGASTRODUODENOSCOPY  Oct 2011   Dr. Laural Golden: ulcer in distal esophagus, soft stricture at GE junction s/p balloon dilation PATH: BARRETT'S  . ESOPHAGOGASTRODUODENOSCOPY (EGD) WITH ESOPHAGEAL DILATION N/A 03/27/2013   Procedure:  ESOPHAGOGASTRODUODENOSCOPY (EGD) WITH ESOPHAGEAL DILATION;  Surgeon: Daneil Dolin, MD;  [facility];  Service: Endoscopy;  Laterality: N/A;  possible dilation  . ESOPHAGOGASTRODUODENOSCOPY (EGD) WITH PROPOFOL N/A 01/02/2017   Procedure: ESOPHAGOGASTRODUODENOSCOPY (EGD) WITH PROPOFOL;  Surgeon: Daneil Dolin, MD;  [facility];  Service: Endoscopy;  Laterality: N/A;  with possible esophageal dilation  . ESOPHAGOGASTRODUODENOSCOPY (EGD) WITH PROPOFOL N/A 04/17/2017   Procedure: ESOPHAGOGASTRODUODENOSCOPY (EGD) WITH PROPOFOL;  Surgeon: Daneil Dolin, MD;  [facility];  Service: Endoscopy;  Laterality: N/A;  . EUS N/A 05/09/2013   Procedure: UPPER ENDOSCOPIC ULTRASOUND (EUS) LINEAR;  Surgeon: Milus Banister, MD;  [facility];  Service: Endoscopy;  Laterality: N/A;  . FRACTURE SURGERY    . LAPAROSCOPIC CHOLECYSTECTOMY    . LEFT HEART CATH AND CORONARY ANGIOGRAPHY N/A 02/26/2018   Procedure: LEFT HEART CATH AND CORONARY ANGIOGRAPHY;  Surgeon: Burnell Blanks, MD;  Location: [facility];  Service: Cardiovascular;  Laterality: N/A;  . Venia Minks DILATION N/A 04/17/2017   Procedure: Venia Minks DILATION;  Surgeon: Daneil Dolin, MD;  [facility];  Service: Endoscopy;  Laterality: N/A;  . PATELLA FRACTURE SURGERY Left   . POLYPECTOMY  04/17/2017   Procedure: POLYPECTOMY;  Surgeon: Daneil Dolin, MD;  [facility];  Service: Endoscopy;;  . TUBAL LIGATION  1992  . UPPER EXTREMITY ANGIOGRAPHY N/A 01/19/2018   Procedure: UPPER EXTREMITY ANGIOGRAPHY;  Surgeon: Elam Dutch, MD;  Location: [facility];  Service: Cardiovascular;  Laterality: N/A;    Social History   Socioeconomic History  . Marital status: Divorced    Spouse name: Not on file  . Number of children: Not on file  . Years of education: Not on file  . Highest education level: Not on file  Social Needs  . Financial resource strain: Not on file  . Food  insecurity - worry: Not on file  . Food insecurity - inability: Not on file  . Transportation needs - medical: Not on file  . Transportation needs - non-medical: Not on file  Occupational History  . Occupation: farm  Tobacco Use  . Smoking status: Current Every Day Smoker    Packs/day: 0.50    Years: 41.00    Pack years: 20.50    Types: Cigarettes    Start date: 09/26/1982  . Smokeless tobacco: Never Used  Substance and Sexual Activity  . Alcohol use: No  . Drug use: No  . Sexual activity: Not Currently    Partners: Male    Birth control/protection: Surgical  Other Topics Concern  . Not on file  Social History Narrative   Lives in [place], [place].   Is on disability, was a Psychologist, sport and exercise and drove tractors.    Wears seatbelt.   Cannot eat meat due to esophagus.   Smokes cigarettes.   Drinks  sodas, 1/2 liter a day.    3 boys and 1 girl. 2 boys live in [place]. 1 son died in 25th month of pregnancy.   Live with friend and his wife at this time.   Used to be married and was abandoned, he would not give her a divorce.        Vitals:   03/12/18 1600  BP: 110/72  Pulse: 76  SpO2: 92%  Weight: 188 lb (85.3 kg)  Height: 5\' 7"  (1.702 m)    Wt Readings from Last 3 Encounters:  03/12/18 188 lb (85.3 kg)  03/06/18 186 lb 4 oz (84.5 kg)  02/26/18 183 lb 6.4 oz (83.2 kg)     PHYSICAL EXAM General: NAD HEENT: Normal. Neck: No JVD, no thyromegaly. Lungs: Clear to auscultation bilaterally with normal respiratory effort. CV: Regular rate and rhythm, normal S1/S2, no B0/F7, 2/6 systolic murmur along left sternal border. No pretibial or periankle edema.  No carotid bruit.   Abdomen: Soft, nontender, no distention.  Neurologic: Alert and oriented.  Psych: Normal affect. Skin: Normal. Musculoskeletal: No gross deformities.    ECG: Most recent ECG reviewed.   Labs: Lab Results  Component Value Date/Time   K 3.8 02/26/2018 05:00 AM   BUN 12 02/26/2018 05:00 AM   CREATININE  0.92 02/26/2018 05:00 AM   CREATININE 0.94 02/08/2018 12:09 PM   ALT 19 02/21/2018 03:45 PM   TSH 3.78 01/17/2018 11:53 AM   HGB 12.2 02/26/2018 05:00 AM     Lipids: Lab Results  Component Value Date/Time   LDLCALC 171 (H) 01/17/2018 11:53 AM   CHOL 272 (H) 01/17/2018 11:53 AM   TRIG 348 (H) 01/17/2018 11:53 AM   HDL 42 (L) 01/17/2018 11:53 AM       ASSESSMENT AND PLAN: 1.  Hypertension: Blood pressure is controlled.  No changes.  2.  Dyslipidemia: Lipids previously reviewed.  Continue Lipitor.  3.  Peripheral vascular disease with subclavian steal artery syndrome: She was evaluated by Dr. Oneida Alar on 01/19/18 and underwent aortic arch angiography.  There was noted to be occlusion of the left subclavian artery.  She has scheduled to undergo left carotid to subclavian bypass surgery on 03/19/18. Currently on aspirin and statin.    Disposition: Follow up as needed   Kate Sable, M.D., F.A.C.C.

## 2018-03-13 ENCOUNTER — Other Ambulatory Visit: Payer: Self-pay | Admitting: Family Medicine

## 2018-03-13 DIAGNOSIS — G458 Other transient cerebral ischemic attacks and related syndromes: Secondary | ICD-10-CM

## 2018-03-13 NOTE — Telephone Encounter (Signed)
Patient left message on nurse line stating that her surgery is not until the 25th and she was told Dr Mannie Stabile would refill her pain medication. Please advise

## 2018-03-13 NOTE — Pre-Procedure Instructions (Signed)
Rita [name]  03/13/2018      [facility] [phone] - [place], [place] - [place] [address] [place] [address] Phone: [phone] Fax: [phone]    Your procedure is scheduled on March 19, 2018.  Report to [facility] Admitting at 530 AM.  Call this number if you have problems the morning of surgery:  [phone]   Remember:  Do not eat food or drink liquids after midnight.  Take these medicines the morning of surgery with A SIP OF WATER amlodipine (norvasc), aspirin, dexlansoprazole (dexilant), ondansetron (zofran), metoclopramide (reglan), ondansetron (zofran), oxycodone-if needed.  Beginning now, STOP taking any Aleve, Naproxen, Ibuprofen, Motrin, Advil, Goody's, BC's, all herbal medications, fish oil, and all vitamins  Continue all other medications as instructed by your physician except follow the above medication instructions before surgery   Do not wear jewelry, make-up or nail polish.  Do not wear lotions, powders, or perfumes, or deodorant.  Do not shave 48 hours prior to surgery.    Do not bring valuables to the hospital.  [facility] is not responsible for any belongings or valuables.  Contacts, dentures or bridgework may not be worn into surgery.  Leave your suitcase in the car.  After surgery it may be brought to your room.  For patients admitted to the hospital, discharge time will be determined by your treatment team.  Patients discharged the day of surgery will not be allowed to drive home.   [facility]- Preparing For Surgery  Before surgery, you can play an important role. Because skin is not sterile, your skin needs to be as free of germs as possible. You can reduce the number of germs on your skin by washing with CHG (chlorahexidine gluconate) Soap before surgery.  CHG is an antiseptic cleaner which kills germs and bonds with the skin to continue killing germs even after  washing.  Please do not use if you have an allergy to CHG or antibacterial soaps. If your skin becomes reddened/irritated stop using the CHG.  Do not shave (including legs and underarms) for at least 48 hours prior to first CHG shower. It is OK to shave your face.  Please follow these instructions carefully.   1. Shower the NIGHT BEFORE SURGERY and the MORNING OF SURGERY with CHG.   2. If you chose to wash your hair, wash your hair first as usual with your normal shampoo.  3. After you shampoo, rinse your hair and body thoroughly to remove the shampoo.  4. Use CHG as you would any other liquid soap. You can apply CHG directly to the skin and wash gently with a scrungie or a clean washcloth.   5. Apply the CHG Soap to your body ONLY FROM THE NECK DOWN.  Do not use on open wounds or open sores. Avoid contact with your eyes, ears, mouth and genitals (private parts). Wash Face and genitals (private parts)  with your normal soap.  6. Wash thoroughly, paying special attention to the area where your surgery will be performed.  7. Thoroughly rinse your body with warm water from the neck down.  8. DO NOT shower/wash with your normal soap after using and rinsing off the CHG Soap.  9. Pat yourself dry with a CLEAN TOWEL.  10. Wear CLEAN PAJAMAS to bed the night before surgery, wear comfortable clothes the morning of surgery  11. Place CLEAN SHEETS on your bed the night of  your first shower and DO NOT SLEEP WITH PETS.  Day of Surgery: Do not apply any deodorants/lotions. Please wear clean clothes to the hospital/surgery center.    Please read over the following fact sheets that you were given. Pain Booklet, Coughing and Deep Breathing, MRSA Information and Surgical Site Infection Prevention

## 2018-03-13 NOTE — Telephone Encounter (Signed)
Yes, I did say that I would refill this pain medication until her vascular surgery.  Please put the prescription in for refill.  Find out how much she is taking on a daily basis.Rita Her. Mannie Stabile, MD

## 2018-03-13 NOTE — Telephone Encounter (Signed)
Patient states she is taking 2 pills (Oxycodone 5 mg) in the morning, 2 at lunch and anywhere from 2-6 throughout the night. I verified this with her three times. I advised that per medication instructions, she was only to be taking 1 pill every 4 hours. She states she knows but one was not enough.

## 2018-03-14 ENCOUNTER — Telehealth: Payer: Self-pay | Admitting: Family Medicine

## 2018-03-14 ENCOUNTER — Encounter (HOSPITAL_COMMUNITY): Payer: Self-pay

## 2018-03-14 ENCOUNTER — Other Ambulatory Visit: Payer: Self-pay

## 2018-03-14 ENCOUNTER — Encounter (HOSPITAL_COMMUNITY)
Admission: RE | Admit: 2018-03-14 | Discharge: 2018-03-14 | Disposition: A | Discharge status: Home / Self Care | Payer: Medicare Other | Source: Ambulatory Visit | Attending: Vascular Surgery | Admitting: Vascular Surgery

## 2018-03-14 DIAGNOSIS — Z7982 Long term (current) use of aspirin: Secondary | ICD-10-CM | POA: Insufficient documentation

## 2018-03-14 DIAGNOSIS — I10 Essential (primary) hypertension: Secondary | ICD-10-CM | POA: Diagnosis not present

## 2018-03-14 DIAGNOSIS — K219 Gastro-esophageal reflux disease without esophagitis: Secondary | ICD-10-CM | POA: Insufficient documentation

## 2018-03-14 DIAGNOSIS — J449 Chronic obstructive pulmonary disease, unspecified: Secondary | ICD-10-CM | POA: Insufficient documentation

## 2018-03-14 DIAGNOSIS — F17200 Nicotine dependence, unspecified, uncomplicated: Secondary | ICD-10-CM | POA: Insufficient documentation

## 2018-03-14 DIAGNOSIS — Z01812 Encounter for preprocedural laboratory examination: Secondary | ICD-10-CM | POA: Diagnosis not present

## 2018-03-14 DIAGNOSIS — E785 Hyperlipidemia, unspecified: Secondary | ICD-10-CM | POA: Diagnosis not present

## 2018-03-14 DIAGNOSIS — Z79899 Other long term (current) drug therapy: Secondary | ICD-10-CM | POA: Diagnosis not present

## 2018-03-14 HISTORY — DX: Dyspnea, unspecified: R06.00

## 2018-03-14 LAB — CBC
HCT: 37.1 % (ref 36.0–46.0)
HEMOGLOBIN: 12.3 g/dL (ref 12.0–15.0)
MCH: 29.7 pg (ref 26.0–34.0)
MCHC: 33.2 g/dL (ref 30.0–36.0)
MCV: 89.6 fL (ref 78.0–100.0)
Platelets: 324 10*3/uL (ref 150–400)
RBC: 4.14 MIL/uL (ref 3.87–5.11)
RDW: 14.2 % (ref 11.5–15.5)
WBC: 14.3 10*3/uL — ABNORMAL HIGH (ref 4.0–10.5)

## 2018-03-14 LAB — BLOOD GAS, ARTERIAL
Acid-Base Excess: 3.4 mmol/L — ABNORMAL HIGH (ref 0.0–2.0)
BICARBONATE: 27.6 mmol/L (ref 20.0–28.0)
Drawn by: 470591
FIO2: 21
O2 Saturation: 96.2 %
PO2 ART: 77.4 mmHg — AB (ref 83.0–108.0)
Patient temperature: 98.6
pCO2 arterial: 43.1 mmHg (ref 32.0–48.0)
pH, Arterial: 7.422 (ref 7.350–7.450)

## 2018-03-14 LAB — COMPREHENSIVE METABOLIC PANEL
ALBUMIN: 3.5 g/dL (ref 3.5–5.0)
ALK PHOS: 59 U/L (ref 38–126)
ALT: 18 U/L (ref 14–54)
ANION GAP: 10 (ref 5–15)
AST: 22 U/L (ref 15–41)
BILIRUBIN TOTAL: 0.5 mg/dL (ref 0.3–1.2)
BUN: 5 mg/dL — ABNORMAL LOW (ref 6–20)
CALCIUM: 8.9 mg/dL (ref 8.9–10.3)
CO2: 25 mmol/L (ref 22–32)
Chloride: 103 mmol/L (ref 101–111)
Creatinine, Ser: 0.82 mg/dL (ref 0.44–1.00)
GFR calc non Af Amer: >60 mL/min (ref >60)
GLUCOSE: 129 mg/dL — AB (ref 65–99)
Potassium: 2.8 mmol/L — ABNORMAL LOW (ref 3.5–5.1)
Sodium: 138 mmol/L (ref 135–145)
Total Protein: 6.5 g/dL (ref 6.5–8.1)

## 2018-03-14 LAB — PROTIME-INR
INR: 0.92
Prothrombin Time: 12.2 seconds (ref 11.4–15.2)

## 2018-03-14 LAB — URINALYSIS, ROUTINE W REFLEX MICROSCOPIC
BILIRUBIN URINE: NEGATIVE
GLUCOSE, UA: NEGATIVE mg/dL
KETONES UR: NEGATIVE mg/dL
LEUKOCYTES UA: NEGATIVE
Nitrite: NEGATIVE
PH: 7 (ref 5.0–8.0)
PROTEIN: NEGATIVE mg/dL
Specific Gravity, Urine: 1.009 (ref 1.005–1.030)

## 2018-03-14 LAB — APTT: aPTT: 29 seconds (ref 24–36)

## 2018-03-14 LAB — TYPE AND SCREEN
ABO/RH(D): O POS
Antibody Screen: NEGATIVE

## 2018-03-14 LAB — SURGICAL PCR SCREEN
MRSA, PCR: NEGATIVE
Staphylococcus aureus: NEGATIVE

## 2018-03-14 LAB — ABO/RH: ABO/RH(D): O POS

## 2018-03-14 MED ORDER — OXYCODONE HCL 10 MG PO TABS: 10.0000 mg | ORAL_TABLET | Freq: Four times a day (QID) | ORAL | 0 refills | Status: DC | PRN

## 2018-03-14 NOTE — Progress Notes (Addendum)
PCP: Dr. Caren Macadam in [place] Cardiologist: Dr. Bronson Ing   Pt. reports for past 6 months she has pain from right shoulder across breast and up to her jaw. Stated cardiologist/Dr. Oneida Alar is aware and she had cardiac studies that were negative.  Spoke with Ulis Rias @ Dr. Oneida Alar' office, stated for pt. To continue aspirin.  Called  Abnormal lab results to [facility] @ Dr Nona Dell office. K+ 2.8, WBC 14.3 and urine.

## 2018-03-14 NOTE — Telephone Encounter (Signed)
Patient informed of message below, verbalized understanding.  

## 2018-03-14 NOTE — Telephone Encounter (Signed)
Please call patient and advised that I have sent in the [facility] code own pills for refill.  However I did give her the 10 mg tablets.  She should take no more than 1 pill every 6 hours as needed for pain.  I have given her 40 pills.  This should last her until her surgery.Gwen Her. Mannie Stabile, MD

## 2018-03-15 NOTE — Progress Notes (Signed)
Anesthesia Chart Review:  Pt is a 52 year old female scheduled for L carotid-subclavian bypass graft on 03/19/2018 with [name], MD  - PCP is Caren Macadam, MD - Cardiologist is Kate Sable, MD who is aware of upcoming surgery. Last office visit 03/12/18; PRN f/u recommended.   PMH includes:  HTN, heart murmur, hyperlipidemia, COPD, chronic pancreatitis, GERD. Current smoker. BMI 29  Medications include: amlodipine, ASA 81mg , lipitor, dexilant, potassium  BP 135/69   Pulse 77   Temp 36.7 C   Resp 18   Ht 5\' 7"  (1.702 m)   Wt 185 lb 12.8 oz (84.3 kg)   LMP 02/09/2012   SpO2 98%   BMI 29.10 kg/m   Preoperative labs reviewed.   - K 2.8.  - Pre-admission testing RN notified Zigmund Daniel in Dr. Oneida Alar' office of low K.  Will recheck K day of surgery.   CXR 02/21/18: Borderline cardiomegaly and mild pulmonary vascular congestion.  EKG 02/23/18: Sinus bradycardia (58 bpm). RBBB  Cardiac cath 02/27/18 (for abnormal stress test):  1. No angiographic evidence of CAD.   Echo 02/23/18:  - Left ventricle: The cavity size was normal. Systolic function was normal. The estimated ejection fraction was in the range of 55% to 60%. Wall motion was normal; there were no regional wall motion abnormalities. The study is not technically sufficient to allow evaluation of LV diastolic function. - Aortic valve: Transvalvular velocity was within the normal range. There was no stenosis. There was no regurgitation. - Mitral valve: Transvalvular velocity was within the normal range. There was no evidence for stenosis. There was trivial regurgitation. - Left atrium: The atrium was moderately dilated. - Right ventricle: The cavity size was normal. Wall thickness was normal. Systolic function was normal. - Atrial septum: No defect or patent foramen ovale was identified by color flow Doppler. - Pulmonary arteries: Systolic pressure was within the normal range.  CT angio chest/abd/pelvis 02/21/18:  1. No definite  acute vascular findings in the chest, abdomen or pelvis. No evidence of aneurysm or dissection. 2. High-grade stenosis or focal short segment occlusion of the proximal left subclavian artery, proximal to the vertebral artery. This predisposes to subclavian steal syndrome. 3.  Aortic Atherosclerosis (ICD10-I70.0). 4. Hiatal hernia and distal esophageal wall thickening, similar to previous studies. 5. Hepatic steatosis.  Carotid duplex 01/18/18:  - Right Carotid: Velocities in the right ICA are consistent with a 1-39% stenosis. - Left Carotid: Velocities in the left ICA are consistent with a 1-39% stenosis. - Vertebrals: Right vertebral artery was patent with antegrade flow. A left vertebral artery reversal of flow was noted - Subclavians: Normal flow hemodynamics were seen in the right subclavian artery. Monophasic left subclavian artery waveform consistent with subclavian stenosis.  If labs acceptable day of surgery, I anticipate pt can proceed with surgery as scheduled.   Willeen Cass, FNP-BC [facility]/Anesthesiology Phone: [phone] 03/15/2018 1:11 PM

## 2018-03-16 ENCOUNTER — Telehealth: Payer: Self-pay | Admitting: Internal Medicine

## 2018-03-16 DIAGNOSIS — R1310 Dysphagia, unspecified: Secondary | ICD-10-CM

## 2018-03-16 DIAGNOSIS — K209 Esophagitis, unspecified without bleeding: Secondary | ICD-10-CM

## 2018-03-16 DIAGNOSIS — K2270 Barrett's esophagus without dysplasia: Secondary | ICD-10-CM

## 2018-03-16 DIAGNOSIS — K2100 Gastro-esophageal reflux disease with esophagitis, without bleeding: Secondary | ICD-10-CM

## 2018-03-16 DIAGNOSIS — R1319 Other dysphagia: Secondary | ICD-10-CM

## 2018-03-16 MED ORDER — DEXLANSOPRAZOLE 60 MG PO CPDR: 60.0000 mg | DELAYED_RELEASE_CAPSULE | Freq: Every day | ORAL | 3 refills | Status: DC

## 2018-03-16 NOTE — Telephone Encounter (Signed)
Pt asked if we could send in a prescription of Dexilant to [facility] on [address]

## 2018-03-16 NOTE — Telephone Encounter (Signed)
Routing to refill box  

## 2018-03-16 NOTE — Addendum Note (Signed)
Addended by: Gordy Levan, Inocente Krach A on: 03/16/2018 02:34 PM   Modules accepted: Orders

## 2018-03-19 ENCOUNTER — Other Ambulatory Visit: Payer: Self-pay

## 2018-03-19 ENCOUNTER — Inpatient Hospital Stay (HOSPITAL_COMMUNITY): Payer: Medicare Other | Admitting: Emergency Medicine

## 2018-03-19 ENCOUNTER — Encounter (HOSPITAL_COMMUNITY): Payer: Self-pay

## 2018-03-19 ENCOUNTER — Inpatient Hospital Stay (HOSPITAL_COMMUNITY): Payer: Medicare Other | Admitting: Vascular Surgery

## 2018-03-19 ENCOUNTER — Encounter (HOSPITAL_COMMUNITY)
Admission: RE | Disposition: A | Discharge status: Home / Self Care | Payer: Self-pay | Source: Ambulatory Visit | Attending: Vascular Surgery

## 2018-03-19 ENCOUNTER — Inpatient Hospital Stay (HOSPITAL_COMMUNITY): Payer: Medicare Other

## 2018-03-19 ENCOUNTER — Inpatient Hospital Stay (HOSPITAL_COMMUNITY)
Admission: RE | Admit: 2018-03-19 | Discharge: 2018-03-21 | DRG: 039 | Disposition: A | Discharge status: Home / Self Care | Payer: Medicare Other | Source: Ambulatory Visit | Attending: Vascular Surgery | Admitting: Vascular Surgery

## 2018-03-19 DIAGNOSIS — R5383 Other fatigue: Secondary | ICD-10-CM | POA: Diagnosis not present

## 2018-03-19 DIAGNOSIS — I6522 Occlusion and stenosis of left carotid artery: Secondary | ICD-10-CM | POA: Diagnosis not present

## 2018-03-19 DIAGNOSIS — F17210 Nicotine dependence, cigarettes, uncomplicated: Secondary | ICD-10-CM | POA: Diagnosis present

## 2018-03-19 DIAGNOSIS — I10 Essential (primary) hypertension: Secondary | ICD-10-CM | POA: Diagnosis not present

## 2018-03-19 DIAGNOSIS — R109 Unspecified abdominal pain: Secondary | ICD-10-CM | POA: Diagnosis present

## 2018-03-19 DIAGNOSIS — R402413 Glasgow coma scale score 13-15, at hospital admission: Secondary | ICD-10-CM | POA: Diagnosis not present

## 2018-03-19 DIAGNOSIS — Z8711 Personal history of peptic ulcer disease: Secondary | ICD-10-CM | POA: Diagnosis not present

## 2018-03-19 DIAGNOSIS — K219 Gastro-esophageal reflux disease without esophagitis: Secondary | ICD-10-CM | POA: Diagnosis not present

## 2018-03-19 DIAGNOSIS — Z7982 Long term (current) use of aspirin: Secondary | ICD-10-CM

## 2018-03-19 DIAGNOSIS — Z79899 Other long term (current) drug therapy: Secondary | ICD-10-CM | POA: Diagnosis not present

## 2018-03-19 DIAGNOSIS — Z4682 Encounter for fitting and adjustment of non-vascular catheter: Secondary | ICD-10-CM | POA: Diagnosis not present

## 2018-03-19 DIAGNOSIS — M549 Dorsalgia, unspecified: Secondary | ICD-10-CM | POA: Diagnosis not present

## 2018-03-19 DIAGNOSIS — K2270 Barrett's esophagus without dysplasia: Secondary | ICD-10-CM | POA: Diagnosis not present

## 2018-03-19 DIAGNOSIS — D649 Anemia, unspecified: Secondary | ICD-10-CM | POA: Diagnosis not present

## 2018-03-19 DIAGNOSIS — M1990 Unspecified osteoarthritis, unspecified site: Secondary | ICD-10-CM | POA: Diagnosis present

## 2018-03-19 DIAGNOSIS — G8929 Other chronic pain: Secondary | ICD-10-CM | POA: Diagnosis present

## 2018-03-19 DIAGNOSIS — I708 Atherosclerosis of other arteries: Secondary | ICD-10-CM | POA: Diagnosis present

## 2018-03-19 DIAGNOSIS — E876 Hypokalemia: Secondary | ICD-10-CM | POA: Diagnosis not present

## 2018-03-19 DIAGNOSIS — I771 Stricture of artery: Secondary | ICD-10-CM | POA: Diagnosis present

## 2018-03-19 DIAGNOSIS — G458 Other transient cerebral ischemic attacks and related syndromes: Secondary | ICD-10-CM

## 2018-03-19 DIAGNOSIS — Z9889 Other specified postprocedural states: Secondary | ICD-10-CM

## 2018-03-19 HISTORY — PX: CAROTID-SUBCLAVIAN BYPASS GRAFT: SHX910

## 2018-03-19 LAB — CBC
HEMATOCRIT: 38.4 % (ref 36.0–46.0)
HEMOGLOBIN: 12.7 g/dL (ref 12.0–15.0)
MCH: 29.5 pg (ref 26.0–34.0)
MCHC: 33.1 g/dL (ref 30.0–36.0)
MCV: 89.1 fL (ref 78.0–100.0)
Platelets: 399 10*3/uL (ref 150–400)
RBC: 4.31 MIL/uL (ref 3.87–5.11)
RDW: 14.2 % (ref 11.5–15.5)
WBC: 8.8 10*3/uL (ref 4.0–10.5)

## 2018-03-19 LAB — POCT I-STAT 4, (NA,K, GLUC, HGB,HCT)
GLUCOSE: 102 mg/dL — AB (ref 65–99)
HEMATOCRIT: 33 % — AB (ref 36.0–46.0)
HEMOGLOBIN: 11.2 g/dL — AB (ref 12.0–15.0)
POTASSIUM: 2.8 mmol/L — AB (ref 3.5–5.1)
Sodium: 142 mmol/L (ref 135–145)

## 2018-03-19 LAB — CREATININE, SERUM
Creatinine, Ser: 0.94 mg/dL (ref 0.44–1.00)
GFR calc non Af Amer: >60 mL/min (ref >60)

## 2018-03-19 SURGERY — CREATION, BYPASS, ARTERIAL, SUBCLAVIAN TO CAROTID, USING GRAFT
Anesthesia: General | Site: Neck | Laterality: Left

## 2018-03-19 MED ORDER — PROTAMINE SULFATE 10 MG/ML IV SOLN
INTRAVENOUS | Status: DC | PRN
  Administered 2018-03-19: 50 mg via INTRAVENOUS

## 2018-03-19 MED ORDER — PHENYLEPHRINE HCL 10 MG/ML IJ SOLN
INTRAMUSCULAR | Status: DC | PRN
  Administered 2018-03-19: 40 ug via INTRAVENOUS

## 2018-03-19 MED ORDER — SODIUM CHLORIDE 0.9 % IV SOLN
INTRAVENOUS | Status: DC | PRN
  Administered 2018-03-19: 08:00:00

## 2018-03-19 MED ORDER — METOPROLOL TARTRATE 5 MG/5ML IV SOLN: 2.0000 mg | INTRAVENOUS | Status: DC | PRN

## 2018-03-19 MED ORDER — SODIUM CHLORIDE 0.9 % IV SOLN
INTRAVENOUS | Status: DC
  Administered 2018-03-19: 12:00:00 via INTRAVENOUS

## 2018-03-19 MED ORDER — SODIUM CHLORIDE 0.9 % IV SOLN: 500.0000 mL | Freq: Once | INTRAVENOUS | Status: DC | PRN

## 2018-03-19 MED ORDER — PROPOFOL 10 MG/ML IV BOLUS
INTRAVENOUS | Status: AC
  Filled 2018-03-19: qty 20

## 2018-03-19 MED ORDER — DEXAMETHASONE SODIUM PHOSPHATE 10 MG/ML IJ SOLN
INTRAMUSCULAR | Status: AC
  Filled 2018-03-19: qty 1

## 2018-03-19 MED ORDER — DOCUSATE SODIUM 100 MG PO CAPS
100.0000 mg | ORAL_CAPSULE | Freq: Every day | ORAL | Status: DC
  Administered 2018-03-20 – 2018-03-21 (×2): 100 mg via ORAL
  Filled 2018-03-19 (×2): qty 1

## 2018-03-19 MED ORDER — PROPOFOL 10 MG/ML IV BOLUS
INTRAVENOUS | Status: DC | PRN
  Administered 2018-03-19: 130 mg via INTRAVENOUS
  Administered 2018-03-19: 20 mg via INTRAVENOUS

## 2018-03-19 MED ORDER — ONDANSETRON HCL 4 MG/2ML IJ SOLN: 4.0000 mg | Freq: Four times a day (QID) | INTRAMUSCULAR | Status: DC | PRN

## 2018-03-19 MED ORDER — ONDANSETRON HCL 4 MG PO TABS: 4.0000 mg | ORAL_TABLET | Freq: Three times a day (TID) | ORAL | Status: DC | PRN

## 2018-03-19 MED ORDER — FENTANYL CITRATE (PF) 100 MCG/2ML IJ SOLN
INTRAMUSCULAR | Status: AC
  Administered 2018-03-19: 50 ug via INTRAVENOUS
  Filled 2018-03-19: qty 2

## 2018-03-19 MED ORDER — OXYCODONE HCL 5 MG PO TABS: 5.0000 mg | ORAL_TABLET | Freq: Once | ORAL | Status: DC | PRN

## 2018-03-19 MED ORDER — TRAZODONE HCL 50 MG PO TABS
50.0000 mg | ORAL_TABLET | Freq: Every day | ORAL | Status: DC
  Administered 2018-03-19 – 2018-03-20 (×2): 50 mg via ORAL
  Filled 2018-03-19 (×2): qty 1

## 2018-03-19 MED ORDER — LIDOCAINE HCL (PF) 1 % IJ SOLN
INTRAMUSCULAR | Status: AC
  Filled 2018-03-19: qty 30

## 2018-03-19 MED ORDER — LABETALOL HCL 5 MG/ML IV SOLN: 10.0000 mg | INTRAVENOUS | Status: DC | PRN

## 2018-03-19 MED ORDER — ONDANSETRON HCL 4 MG/2ML IJ SOLN: 4.0000 mg | Freq: Once | INTRAMUSCULAR | Status: DC | PRN

## 2018-03-19 MED ORDER — POTASSIUM CHLORIDE CRYS ER 20 MEQ PO TBCR: 20.0000 meq | EXTENDED_RELEASE_TABLET | Freq: Every day | ORAL | Status: DC | PRN

## 2018-03-19 MED ORDER — CEFAZOLIN SODIUM-DEXTROSE 2-4 GM/100ML-% IV SOLN
2.0000 g | INTRAVENOUS | Status: AC
  Administered 2018-03-19: 2 g via INTRAVENOUS
  Filled 2018-03-19: qty 100

## 2018-03-19 MED ORDER — ACETAMINOPHEN 325 MG PO TABS: 325.0000 mg | ORAL_TABLET | ORAL | Status: DC | PRN

## 2018-03-19 MED ORDER — MAGNESIUM SULFATE 2 GM/50ML IV SOLN: 2.0000 g | Freq: Every day | INTRAVENOUS | Status: DC | PRN

## 2018-03-19 MED ORDER — ROCURONIUM BROMIDE 10 MG/ML (PF) SYRINGE
PREFILLED_SYRINGE | INTRAVENOUS | Status: AC
  Filled 2018-03-19: qty 5

## 2018-03-19 MED ORDER — PANTOPRAZOLE SODIUM 40 MG PO TBEC
40.0000 mg | DELAYED_RELEASE_TABLET | Freq: Every day | ORAL | Status: DC
  Administered 2018-03-20 – 2018-03-21 (×2): 40 mg via ORAL
  Filled 2018-03-19 (×2): qty 1

## 2018-03-19 MED ORDER — FENTANYL CITRATE (PF) 100 MCG/2ML IJ SOLN
INTRAMUSCULAR | Status: DC | PRN
  Administered 2018-03-19 (×3): 50 ug via INTRAVENOUS
  Administered 2018-03-19: 100 ug via INTRAVENOUS

## 2018-03-19 MED ORDER — DEXAMETHASONE SODIUM PHOSPHATE 10 MG/ML IJ SOLN
INTRAMUSCULAR | Status: DC | PRN
Start: 2018-03-19 — End: 2018-03-19
  Administered 2018-03-19: 10 mg via INTRAVENOUS

## 2018-03-19 MED ORDER — CHLORHEXIDINE GLUCONATE CLOTH 2 % EX PADS: 6.0000 | MEDICATED_PAD | Freq: Once | CUTANEOUS | Status: DC

## 2018-03-19 MED ORDER — ATORVASTATIN CALCIUM 40 MG PO TABS
40.0000 mg | ORAL_TABLET | Freq: Every day | ORAL | Status: DC
  Administered 2018-03-19 – 2018-03-20 (×2): 40 mg via ORAL
  Filled 2018-03-19 (×2): qty 1

## 2018-03-19 MED ORDER — PHENOL 1.4 % MT LIQD: 1.0000 | OROMUCOSAL | Status: DC | PRN

## 2018-03-19 MED ORDER — SODIUM CHLORIDE 0.9 % IV SOLN: INTRAVENOUS | Status: DC

## 2018-03-19 MED ORDER — FENTANYL CITRATE (PF) 100 MCG/2ML IJ SOLN
25.0000 ug | INTRAMUSCULAR | Status: DC | PRN
  Administered 2018-03-19 (×3): 50 ug via INTRAVENOUS

## 2018-03-19 MED ORDER — ROCURONIUM BROMIDE 100 MG/10ML IV SOLN
INTRAVENOUS | Status: DC | PRN
  Administered 2018-03-19: 20 mg via INTRAVENOUS
  Administered 2018-03-19: 50 mg via INTRAVENOUS

## 2018-03-19 MED ORDER — ESMOLOL HCL 100 MG/10ML IV SOLN
INTRAVENOUS | Status: AC
  Filled 2018-03-19: qty 10

## 2018-03-19 MED ORDER — MORPHINE SULFATE (PF) 4 MG/ML IV SOLN
INTRAVENOUS | Status: AC
  Administered 2018-03-19: 2 mg
  Filled 2018-03-19: qty 1

## 2018-03-19 MED ORDER — ALUM & MAG HYDROXIDE-SIMETH 200-200-20 MG/5ML PO SUSP: 15.0000 mL | ORAL | Status: DC | PRN

## 2018-03-19 MED ORDER — SENNOSIDES-DOCUSATE SODIUM 8.6-50 MG PO TABS: 1.0000 | ORAL_TABLET | Freq: Every evening | ORAL | Status: DC | PRN

## 2018-03-19 MED ORDER — SUGAMMADEX SODIUM 200 MG/2ML IV SOLN
INTRAVENOUS | Status: DC | PRN
  Administered 2018-03-19: 150 mg via INTRAVENOUS

## 2018-03-19 MED ORDER — POTASSIUM CHLORIDE CRYS ER 20 MEQ PO TBCR
20.0000 meq | EXTENDED_RELEASE_TABLET | Freq: Every day | ORAL | Status: DC
  Administered 2018-03-19 – 2018-03-20 (×2): 20 meq via ORAL
  Filled 2018-03-19 (×2): qty 1

## 2018-03-19 MED ORDER — HYDRALAZINE HCL 20 MG/ML IJ SOLN
INTRAMUSCULAR | Status: AC
  Administered 2018-03-19: 5 mg via INTRAVENOUS
  Filled 2018-03-19: qty 1

## 2018-03-19 MED ORDER — ENOXAPARIN SODIUM 40 MG/0.4ML ~~LOC~~ SOLN
40.0000 mg | SUBCUTANEOUS | Status: DC
  Administered 2018-03-20: 40 mg via SUBCUTANEOUS
  Filled 2018-03-19: qty 0.4

## 2018-03-19 MED ORDER — ONDANSETRON HCL 4 MG/2ML IJ SOLN
INTRAMUSCULAR | Status: DC | PRN
  Administered 2018-03-19: 4 mg via INTRAVENOUS

## 2018-03-19 MED ORDER — ACETAMINOPHEN 325 MG RE SUPP: 325.0000 mg | RECTAL | Status: DC | PRN

## 2018-03-19 MED ORDER — ONDANSETRON HCL 4 MG/2ML IJ SOLN
INTRAMUSCULAR | Status: AC
  Filled 2018-03-19: qty 2

## 2018-03-19 MED ORDER — OXYCODONE HCL 5 MG PO TABS
10.0000 mg | ORAL_TABLET | ORAL | Status: DC | PRN
  Administered 2018-03-19 – 2018-03-21 (×9): 10 mg via ORAL
  Filled 2018-03-19 (×10): qty 2

## 2018-03-19 MED ORDER — EPHEDRINE SULFATE-NACL 50-0.9 MG/10ML-% IV SOSY
PREFILLED_SYRINGE | INTRAVENOUS | Status: DC | PRN
  Administered 2018-03-19: 10 mg via INTRAVENOUS

## 2018-03-19 MED ORDER — MIDAZOLAM HCL 5 MG/5ML IJ SOLN
INTRAMUSCULAR | Status: DC | PRN
  Administered 2018-03-19: 2 mg via INTRAVENOUS

## 2018-03-19 MED ORDER — HEPARIN SODIUM (PORCINE) 1000 UNIT/ML IJ SOLN
INTRAMUSCULAR | Status: DC | PRN
  Administered 2018-03-19: 8000 [IU] via INTRAVENOUS

## 2018-03-19 MED ORDER — FENTANYL CITRATE (PF) 250 MCG/5ML IJ SOLN
INTRAMUSCULAR | Status: AC
  Filled 2018-03-19: qty 5

## 2018-03-19 MED ORDER — OXYCODONE HCL 5 MG/5ML PO SOLN: 5.0000 mg | Freq: Once | ORAL | Status: DC | PRN

## 2018-03-19 MED ORDER — ESMOLOL HCL 100 MG/10ML IV SOLN
INTRAVENOUS | Status: DC | PRN
  Administered 2018-03-19: 10 mg via INTRAVENOUS

## 2018-03-19 MED ORDER — ESCITALOPRAM OXALATE 10 MG PO TABS
10.0000 mg | ORAL_TABLET | Freq: Every day | ORAL | Status: DC
  Administered 2018-03-19 – 2018-03-20 (×2): 10 mg via ORAL
  Filled 2018-03-19 (×2): qty 1

## 2018-03-19 MED ORDER — 0.9 % SODIUM CHLORIDE (POUR BTL) OPTIME
TOPICAL | Status: DC | PRN
  Administered 2018-03-19: 2000 mL

## 2018-03-19 MED ORDER — PHENYLEPHRINE 40 MCG/ML (10ML) SYRINGE FOR IV PUSH (FOR BLOOD PRESSURE SUPPORT)
PREFILLED_SYRINGE | INTRAVENOUS | Status: AC
  Filled 2018-03-19: qty 10

## 2018-03-19 MED ORDER — SUCCINYLCHOLINE CHLORIDE 200 MG/10ML IV SOSY
PREFILLED_SYRINGE | INTRAVENOUS | Status: AC
  Filled 2018-03-19: qty 10

## 2018-03-19 MED ORDER — MIDAZOLAM HCL 2 MG/2ML IJ SOLN
INTRAMUSCULAR | Status: AC
  Filled 2018-03-19: qty 2

## 2018-03-19 MED ORDER — ASPIRIN EC 81 MG PO TBEC
81.0000 mg | DELAYED_RELEASE_TABLET | Freq: Every day | ORAL | Status: DC
  Administered 2018-03-19 – 2018-03-20 (×2): 81 mg via ORAL
  Filled 2018-03-19 (×2): qty 1

## 2018-03-19 MED ORDER — SUGAMMADEX SODIUM 200 MG/2ML IV SOLN
INTRAVENOUS | Status: AC
  Filled 2018-03-19: qty 2

## 2018-03-19 MED ORDER — BISACODYL 5 MG PO TBEC: 5.0000 mg | DELAYED_RELEASE_TABLET | Freq: Every day | ORAL | Status: DC | PRN

## 2018-03-19 MED ORDER — MORPHINE SULFATE (PF) 2 MG/ML IV SOLN
1.0000 mg | INTRAVENOUS | Status: DC | PRN
  Administered 2018-03-19 – 2018-03-21 (×9): 2 mg via INTRAVENOUS
  Filled 2018-03-19 (×9): qty 1

## 2018-03-19 MED ORDER — GUAIFENESIN-DM 100-10 MG/5ML PO SYRP: 15.0000 mL | ORAL_SOLUTION | ORAL | Status: DC | PRN

## 2018-03-19 MED ORDER — AMLODIPINE BESYLATE 5 MG PO TABS
5.0000 mg | ORAL_TABLET | Freq: Every day | ORAL | Status: DC
  Administered 2018-03-19 – 2018-03-20 (×2): 5 mg via ORAL
  Filled 2018-03-19 (×2): qty 1

## 2018-03-19 MED ORDER — METOCLOPRAMIDE HCL 5 MG PO TABS
5.0000 mg | ORAL_TABLET | Freq: Three times a day (TID) | ORAL | Status: DC
  Administered 2018-03-19 – 2018-03-21 (×5): 5 mg via ORAL
  Filled 2018-03-19 (×5): qty 1

## 2018-03-19 MED ORDER — EPHEDRINE 5 MG/ML INJ
INTRAVENOUS | Status: AC
  Filled 2018-03-19: qty 10

## 2018-03-19 MED ORDER — LACTATED RINGERS IV SOLN
INTRAVENOUS | Status: DC | PRN
  Administered 2018-03-19: 07:00:00 via INTRAVENOUS

## 2018-03-19 MED ORDER — CEFAZOLIN SODIUM-DEXTROSE 2-4 GM/100ML-% IV SOLN
2.0000 g | Freq: Three times a day (TID) | INTRAVENOUS | Status: AC
  Administered 2018-03-19 – 2018-03-20 (×2): 2 g via INTRAVENOUS
  Filled 2018-03-19 (×2): qty 100

## 2018-03-19 MED ORDER — HYDRALAZINE HCL 20 MG/ML IJ SOLN
5.0000 mg | INTRAMUSCULAR | Status: DC | PRN
  Administered 2018-03-19: 5 mg via INTRAVENOUS

## 2018-03-19 SURGICAL SUPPLY — 43 items
CANISTER SUCT 3000ML PPV (MISCELLANEOUS) ×2 IMPLANT
CATH ROBINSON RED A/P 18FR (CATHETERS) ×2 IMPLANT
CLIP VESOCCLUDE MED 24/CT (CLIP) ×2 IMPLANT
CLIP VESOCCLUDE SM WIDE 24/CT (CLIP) ×2 IMPLANT
CRADLE DONUT ADULT HEAD (MISCELLANEOUS) ×2 IMPLANT
DECANTER SPIKE VIAL GLASS SM (MISCELLANEOUS) IMPLANT
DERMABOND ADVANCED (GAUZE/BANDAGES/DRESSINGS) ×1
DERMABOND ADVANCED .7 DNX12 (GAUZE/BANDAGES/DRESSINGS) ×1 IMPLANT
DRAIN HEMOVAC 1/8 X 5 (WOUND CARE) ×2 IMPLANT
ELECT REM PT RETURN 9FT ADLT (ELECTROSURGICAL) ×2
ELECTRODE REM PT RTRN 9FT ADLT (ELECTROSURGICAL) ×1 IMPLANT
EVACUATOR SILICONE 100CC (DRAIN) ×2 IMPLANT
GAUZE SPONGE 4X4 12PLY STRL (GAUZE/BANDAGES/DRESSINGS) ×2 IMPLANT
GLOVE BIO SURGEON STRL SZ 6.5 (GLOVE) ×2 IMPLANT
GLOVE BIO SURGEON STRL SZ7.5 (GLOVE) ×2 IMPLANT
GLOVE BIOGEL PI IND STRL 6.5 (GLOVE) ×2 IMPLANT
GLOVE BIOGEL PI INDICATOR 6.5 (GLOVE) ×2
GLOVE ECLIPSE 7.0 STRL STRAW (GLOVE) ×2 IMPLANT
GOWN STRL REUS W/ TWL LRG LVL3 (GOWN DISPOSABLE) ×4 IMPLANT
GOWN STRL REUS W/TWL LRG LVL3 (GOWN DISPOSABLE) ×4
GRAFT HEMASHIELD 8MM (Vascular Products) ×1 IMPLANT
GRAFT VASC STRG 30X8KNIT (Vascular Products) ×1 IMPLANT
HEMOSTAT SPONGE AVITENE ULTRA (HEMOSTASIS) IMPLANT
INSERT FOGARTY SM (MISCELLANEOUS) IMPLANT
KIT BASIN OR (CUSTOM PROCEDURE TRAY) ×2 IMPLANT
KIT ROOM TURNOVER OR (KITS) ×2 IMPLANT
LOOP VESSEL MINI RED (MISCELLANEOUS) IMPLANT
NEEDLE 22X1 1/2 (OR ONLY) (NEEDLE) IMPLANT
NS IRRIG 1000ML POUR BTL (IV SOLUTION) ×4 IMPLANT
PACK CAROTID (CUSTOM PROCEDURE TRAY) ×2 IMPLANT
PAD ARMBOARD 7.5X6 YLW CONV (MISCELLANEOUS) ×4 IMPLANT
SHUNT CAROTID BYPASS 10 (VASCULAR PRODUCTS) IMPLANT
SUT ETHILON 3 0 PS 1 (SUTURE) ×2 IMPLANT
SUT PROLENE 6 0 C 1 30 (SUTURE) ×10 IMPLANT
SUT PROLENE 6 0 CC (SUTURE) ×4 IMPLANT
SUT PROLENE 7 0 BV 1 (SUTURE) IMPLANT
SUT VIC AB 3-0 SH 27 (SUTURE) ×2
SUT VIC AB 3-0 SH 27X BRD (SUTURE) ×2 IMPLANT
SUT VICRYL 4-0 PS2 18IN ABS (SUTURE) ×2 IMPLANT
SYR CONTROL 10ML LL (SYRINGE) IMPLANT
TOWEL GREEN STERILE (TOWEL DISPOSABLE) ×2 IMPLANT
TRAY FOLEY MTR SLVR 16FR STAT (CATHETERS) IMPLANT
WATER STERILE IRR 1000ML POUR (IV SOLUTION) ×2 IMPLANT

## 2018-03-19 NOTE — Anesthesia Postprocedure Evaluation (Signed)
Anesthesia Post Note  Patient: Rita [name]  Procedure(s) Performed: BYPASS GRAFT CAROTID-SUBCLAVIAN (Left Neck)     Patient location during evaluation: PACU Anesthesia Type: General Level of consciousness: awake and alert Pain management: pain level controlled Vital Signs Assessment: post-procedure vital signs reviewed and stable Respiratory status: spontaneous breathing, nonlabored ventilation, respiratory function stable and patient connected to nasal cannula oxygen Cardiovascular status: blood pressure returned to baseline and stable Postop Assessment: no apparent nausea or vomiting Anesthetic complications: no    Last Vitals:  Vitals:   03/19/18 1430 03/19/18 1515  BP: 135/81 129/81  Pulse: 77 72  Resp: 12   Temp: (!) 36.3 C 36.7 C  SpO2: 99% 96%    Last Pain:  Vitals:   03/19/18 1515  TempSrc: Oral  PainSc:                  [name] COKER

## 2018-03-19 NOTE — Anesthesia Procedure Notes (Signed)
Central Venous Catheter Insertion Performed by: Roberts Gaudy, MD, anesthesiologist Start/End3/25/2019 7:10 AM, 03/19/2018 7:15 AM Patient location: Pre-op. Preanesthetic checklist: patient identified, IV checked, site marked, risks and benefits discussed, surgical consent, monitors and equipment checked, pre-op evaluation, timeout performed and anesthesia consent Lidocaine 1% used for infiltration and patient sedated Hand hygiene performed  and maximum sterile barriers used  Catheter size: 8 Fr Total catheter length 16. Central line was placed.Double lumen Procedure performed using ultrasound guided technique. Ultrasound Notes:image(s) printed for medical record Attempts: 1 Following insertion, dressing applied and line sutured. Post procedure assessment: blood return through all ports  Patient tolerated the procedure well with no immediate complications.

## 2018-03-19 NOTE — H&P (Signed)
Referring Physician: Dr Mannie Stabile   Patient name: Rita [name] MRN: [id number]        DOB: 09/12/1966          Sex: female   REASON FOR CONSULT: Left arm pain   HPI: Rita [name] is a 52 y.o. female with approximately 1 year history of left arm fatigue.  He has occasional numbness tingling and coolness in her left hand.  She denies any dizziness.  She also complains of pain over the dorsum of her left wrist on the ulnar side of the hand.  She was started on aspirin yesterday.  She currently smokes but has been counseled in smoking cessation by her primary care physician.  She denies prior history of myocardial infarction chest pain shortness of breath stroke or diabetes.  Other medical problems include chronic abdominal pain.  Followed by Dr. Gala Romney with GI.   Past Medical History:  Diagnosis Date  . Arthritis    . Barrett's esophagus with esophagitis 03/26/2013  . Chronic abdominal pain    . Chronic back pain    . GERD (gastroesophageal reflux disease)    . Heart murmur    . Hiatal hernia    . Pancreatitis    . Peptic ulcer    . Tobacco use 03/26/2013         Past Surgical History:  Procedure Laterality Date  . BIOPSY   04/17/2017    Procedure: BIOPSY;  Surgeon: Daneil Dolin, MD;  [facility];  Service: Endoscopy;;  esophageal  . CESAREAN SECTION      . CHOLECYSTECTOMY      . COLONOSCOPY WITH PROPOFOL N/A 04/17/2017    Procedure: COLONOSCOPY WITH PROPOFOL;  Surgeon: Daneil Dolin, MD;  [facility];  Service: Endoscopy;  Laterality: N/A;  100  . ESOPHAGOGASTRODUODENOSCOPY   Oct 2011    Dr. Laural Golden: ulcer in distal esophagus, soft stricture at GE junction s/p balloon dilation PATH: BARRETT'S  . ESOPHAGOGASTRODUODENOSCOPY (EGD) WITH ESOPHAGEAL DILATION N/A 03/27/2013    Procedure: ESOPHAGOGASTRODUODENOSCOPY (EGD) WITH ESOPHAGEAL DILATION;  Surgeon: Daneil Dolin, MD;  [facility];  Service: Endoscopy;  Laterality: N/A;  possible dilation  .  ESOPHAGOGASTRODUODENOSCOPY (EGD) WITH PROPOFOL N/A 01/02/2017    Procedure: ESOPHAGOGASTRODUODENOSCOPY (EGD) WITH PROPOFOL;  Surgeon: Daneil Dolin, MD;  [facility];  Service: Endoscopy;  Laterality: N/A;  with possible esophageal dilation  . ESOPHAGOGASTRODUODENOSCOPY (EGD) WITH PROPOFOL N/A 04/17/2017    Procedure: ESOPHAGOGASTRODUODENOSCOPY (EGD) WITH PROPOFOL;  Surgeon: Daneil Dolin, MD;  [facility];  Service: Endoscopy;  Laterality: N/A;  . EUS N/A 05/09/2013    Procedure: UPPER ENDOSCOPIC ULTRASOUND (EUS) LINEAR;  Surgeon: Milus Banister, MD;  Location[facility];  Service: Endoscopy;  Laterality: N/A;  Venia Minks DILATION N/A 04/17/2017    Procedure: Venia Minks DILATION;  Surgeon: Daneil Dolin, MD;  [facility];  Service: Endoscopy;  Laterality: N/A;  . POLYPECTOMY   04/17/2017    Procedure: POLYPECTOMY;  Surgeon: Daneil Dolin, MD;  [facility];  Service: Endoscopy;;  . TUBAL LIGATION               Family History  Problem Relation Age of Onset  . Asthma Mother    . Heart failure Mother    . Cancer Mother          pancreatic  . Diabetes Mother    . Hypertension Mother    .  Stroke Mother    . Pancreatic cancer Mother          deceased  . Heart failure Father    . Diabetes Father    . Colon cancer Neg Hx        SOCIAL HISTORY: Social History         Socioeconomic History  . Marital status: Single      Spouse name: Not on file  . Number of children: Not on file  . Years of education: Not on file  . Highest education level: Not on file  Social Needs  . Financial resource strain: Not on file  . Food insecurity - worry: Not on file  . Food insecurity - inability: Not on file  . Transportation needs - medical: Not on file  . Transportation needs - non-medical: Not on file  Occupational History  . Occupation: farm  Tobacco Use  . Smoking status: Current Every Day Smoker      Packs/day: 1.50      Years: 20.00       Pack years: 30.00      Types: Cigarettes  . Smokeless tobacco: Never Used  Substance and Sexual Activity  . Alcohol use: No  . Drug use: No  . Sexual activity: Yes      Partners: Male      Birth control/protection: Surgical  Other Topics Concern  . Not on file  Social History Narrative    Lives in [place], [place].    Is on disability, was a Psychologist, sport and exercise and drove tractors.     Wears seatbelt.    Cannot eat meat due to esophagus.    Smokes cigarettes.    Drinks sodas, 1/2 liter a day.     3 boys and 1 girl. 2 boys live in [place]. 1 son died in 52th month of pregnancy.    Live with friend and his wife at this time.    Used to be married and was abandoned, he would not give her a divorce.           No Known Allergies         Current Outpatient Medications  Medication Sig Dispense Refill  . dexlansoprazole (DEXILANT) 60 MG capsule Take 60 mg by mouth daily.      . ranitidine (ZANTAC) 150 MG tablet Take 1 tablet (150 mg total) by mouth at bedtime. (Patient taking differently: Take 150 mg as needed by mouth. ) 30 tablet 3  . sucralfate (CARAFATE) 1 g tablet Take 1 tablet (1 g total) by mouth 4 (four) times daily -  with meals and at bedtime. (Patient taking differently: Take 1 g 4 (four) times daily -  with meals and at bedtime by mouth. Doesn't take everyday. Sometimes takes once a day) 30 tablet 1  . amLODipine (NORVASC) 5 MG tablet Take 1 tablet (5 mg total) by mouth daily. (Patient not taking: Reported on 01/18/2018) 30 tablet 0  . aspirin EC 81 MG tablet Take 1 tablet (81 mg total) by mouth daily. (Patient not taking: Reported on 01/18/2018)      . escitalopram (LEXAPRO) 10 MG tablet Take 1 tablet (10 mg total) by mouth daily. (Patient not taking: Reported on 01/18/2018) 30 tablet 0  . esomeprazole (NEXIUM) 20 MG capsule Take 20 mg by mouth daily. Sometimes takes 2-3      . HYDROcodone-acetaminophen (NORCO/VICODIN) 5-325 MG tablet Take 1 tablet by mouth every 6 (six) hours as needed for  moderate pain. (Patient not taking:  Reported on 10/31/2017) 20 tablet 0  . metoCLOPramide (REGLAN) 5 MG tablet Take 1 tablet (5 mg total) by mouth 4 (four) times daily -  before meals and at bedtime. (Patient not taking: Reported on 10/31/2017) 60 tablet 0  . promethazine (PHENERGAN) 25 MG suppository Place 1 suppository (25 mg total) rectally every 6 (six) hours as needed for nausea or vomiting. (Patient not taking: Reported on 10/31/2017) 12 suppository 1    No current facility-administered medications for this visit.       ROS:    General:  No weight loss, Fever, chills   HEENT: No recent headaches, no nasal bleeding, no visual changes, no sore throat   Neurologic: No dizziness, blackouts, seizures. No recent symptoms of stroke or mini- stroke. No recent episodes of slurred speech, or temporary blindness.   Cardiac: No recent episodes of chest pain/pressure, no shortness of breath at rest.  No shortness of breath with exertion.  Denies history of atrial fibrillation or irregular heartbeat   Vascular: No history of rest pain in feet.  No history of claudication.  No history of non-healing ulcer, No history of DVT    Pulmonary: No home oxygen, no productive cough, no hemoptysis,  No asthma or wheezing   Musculoskeletal:  [X]  Arthritis, [ ]  Low back pain,  [X]  Joint pain   Hematologic:No history of hypercoagulable state.  No history of easy bleeding.  No history of anemia   Gastrointestinal: No hematochezia or melena,  No gastroesophageal reflux, no trouble swallowing   Urinary: [ ]  chronic Kidney disease, [ ]  on HD - [ ]  MWF or [ ]  TTHS, [ ]  Burning with urination, [ ]  Frequent urination, [ ]  Difficulty urinating;    Skin: No rashes   Psychological: No history of anxiety,  No history of depression     Physical Examination   Vitals:   03/19/18 0542 03/19/18 0558  BP: 117/63   Pulse: 67   Resp: 18   Temp: 98.7 F (37.1 C)   TempSrc: Oral   SpO2: 96%   Weight:  185 lb 12.8 oz  (84.3 kg)  Height:  5\' 7"  (1.702 m)   General:  Alert and oriented, no acute distress HEENT: Normal Neck: No bruit or JVD Pulmonary: Clear to auscultation bilaterally Cardiac: Regular Rate and Rhythm without murmur Abdomen: Soft, non-tender, non-distended, no mass Skin: No rash Extremity Pulses:  2+ radial, brachial right side absent left brachial radial 2+ femoral, dorsalis pedis, posterior tibial pulses bilaterally Musculoskeletal: No deformity or edema      Neurologic: Upper and lower extremity motor 5/5 and symmetric   DATA:  She had a carotid duplex exam today.  Arm pressure could not be measured on the left side.  Right arm pressure was 139.  Evidence of subclavian stenosis on duplex as well as reversal of flow in the left vertebral artery.  Carotids were less than 40% stenosis bilaterally.   ASSESSMENT: Symptomatic left subclavian artery stenosis with exertional fatigue in the left arm.  I discussed with the patient today that I do not believe the pain that she has in the dorsum of her left wrist is related to her subclavian stenosis but certainly the early fatigue symptoms could be.  She does not have evidence of clinical steal and has no dizziness.  She was previously started on aspirin and simvastatin     PLAN: Left carotid subclavian bypass today.  Risks benefits possible complications including but not limited to bleeding infection stroke nerve  injury    [name], MD [facility] Office: [phone] Pager: [phone]

## 2018-03-19 NOTE — Anesthesia Procedure Notes (Signed)
Arterial Line Insertion Start/End3/25/2019 6:45 AM, 03/19/2018 6:50 AM Performed by: Shirlyn Goltz, CRNA  Patient location: Pre-op. Preanesthetic checklist: patient identified, IV checked, site marked, risks and benefits discussed, surgical consent, monitors and equipment checked, pre-op evaluation and timeout performed Lidocaine 1% used for infiltration and patient sedated Right, radial was placed Catheter size: 20 G Hand hygiene performed , maximum sterile barriers used  and Seldinger technique used Allen's test indicative of satisfactory collateral circulation Attempts: 1 Procedure performed without using ultrasound guided technique. Ultrasound Notes:anatomy identified Following insertion, Biopatch and dressing applied. Post procedure assessment: normal

## 2018-03-19 NOTE — Progress Notes (Signed)
Attempt to call report.

## 2018-03-19 NOTE — Anesthesia Preprocedure Evaluation (Signed)
Anesthesia Evaluation  Patient identified by MRN, date of birth, ID band  Reviewed: Allergy & Precautions, NPO status , Patient's Chart, lab work & pertinent test results  Airway Mallampati: II  TM Distance: >3 FB Neck ROM: Full    Dental  (+) Poor Dentition, Dental Advisory Given   Pulmonary Current Smoker,     + decreased breath sounds      Cardiovascular hypertension,  Rhythm:Regular Rate:Normal     Neuro/Psych    GI/Hepatic   Endo/Other    Renal/GU      Musculoskeletal   Abdominal   Peds  Hematology   Anesthesia Other Findings   Reproductive/Obstetrics                             Anesthesia Physical Anesthesia Plan  ASA: III  Anesthesia Plan: General   Post-op Pain Management:    Induction: Intravenous  PONV Risk Score and Plan: 1 and Ondansetron and Dexamethasone  Airway Management Planned: Oral ETT  Additional Equipment: Arterial line, CVP and Ultrasound Guidance Line Placement  Intra-op Plan:   Post-operative Plan: Extubation in OR  Informed Consent: I have reviewed the patients History and Physical, chart, labs and discussed the procedure including the risks, benefits and alternatives for the proposed anesthesia with the patient or authorized representative who has indicated his/her understanding and acceptance.   Dental advisory given  Plan Discussed with: CRNA and Anesthesiologist  Anesthesia Plan Comments:         Anesthesia Quick Evaluation

## 2018-03-19 NOTE — Transfer of Care (Signed)
Immediate Anesthesia Transfer of Care Note  Patient: Rita [name]  Procedure(s) Performed: BYPASS GRAFT CAROTID-SUBCLAVIAN (Left Neck)  Patient Location: PACU  Anesthesia Type:General  Level of Consciousness: awake, alert , oriented and patient cooperative  Airway & Oxygen Therapy: Patient Spontanous Breathing and Patient connected to nasal cannula oxygen  Post-op Assessment: Report given to RN and Post -op Vital signs reviewed and stable  Post vital signs: Reviewed and stable  Last Vitals:  Vitals Value Taken Time  BP 136/78 03/19/2018 10:27 AM  Temp    Pulse 74 03/19/2018 10:30 AM  Resp 18 03/19/2018 10:30 AM  SpO2 95 % 03/19/2018 10:30 AM  Vitals shown include unvalidated device data.  Last Pain:  Vitals:   03/19/18 0558  TempSrc:   PainSc: 7          Complications: No apparent anesthesia complications

## 2018-03-19 NOTE — Progress Notes (Signed)
    Patient alert and oriented asking for ice chips.  Comfortable now.   Left clavicular area incision with JP drain in place. Bounding left radial palpable pulse.  Sensation and active range of motion intact left hand.    S/P Left carotid subclavian bypass  Disposition stable waiting on stepdown bed.  Roxy Horseman PA-C

## 2018-03-19 NOTE — Anesthesia Procedure Notes (Signed)
Procedure Name: Intubation Date/Time: 03/19/2018 7:45 AM Performed by: Shirlyn Goltz, CRNA Pre-anesthesia Checklist: Patient identified, Emergency Drugs available, Suction available and Patient being monitored Patient Re-evaluated:Patient Re-evaluated prior to induction Oxygen Delivery Method: Circle system utilized Preoxygenation: Pre-oxygenation with 100% oxygen Induction Type: IV induction Ventilation: Mask ventilation without difficulty and Oral airway inserted - appropriate to patient size Laryngoscope Size: Mac and 3 Grade View: Grade I Tube type: Oral Tube size: 7.0 mm Number of attempts: 1 Airway Equipment and Method: Stylet Placement Confirmation: ETT inserted through vocal cords under direct vision,  positive ETCO2 and breath sounds checked- equal and bilateral Secured at: 20 cm Tube secured with: Tape Dental Injury: Teeth and Oropharynx as per pre-operative assessment

## 2018-03-19 NOTE — Op Note (Signed)
Procedure: Left carotid subclavian bypass  Preoperative diagnosis: Exertional fatigue left arm  Postoperative diagnosis: Same  Anesthesia: General  Assistant: Gerri Lins PA-C  Operative findings: #1 8 mm Dacron graft into side to left common carotid left subclavian  Operative details: After obtaining informed consent, the patient was taken the operating.  The patient was placed in supine position operating table.  After induction of general anesthesia and endotracheal intubation, Foley catheter was placed.  Next patient's entire left neck and chest were prepped and draped in usual sterile fashion.  An oblique incision was made about 1 fingerbreadth above the left clavicle running from the lateral head of the sternocleidomastoid muscle about 5 cm laterally.  The incision was carried through the subcutaneous tissues and platysma muscle.  The lateral head of the sternocleidomastoid muscle was divided as well as the omohyoid muscle.  The scalene fat pad was then mobilized and reflected superiorly and laterally.  The internal jugular vein was dissected free circumferentially and then reflected medially to expose the common carotid artery posterior to it.  The vagus nerve was identified and protected.  The anterior scalene muscle was identified at the base of the incision as well as the phrenic nerve.  The scalene muscle was divided while protecting the phrenic nerve.  The subclavian artery was then dissected free circumferentially below this.  Several centimeters of the artery was dissected free to prepare a surface for clamping and the anastomosis.  Patient was given 8000 units of intravenous heparin.  Common carotid artery was clamped proximally distally with a Henley clamps.  A longitudinal opening was made in the common carotid artery and the graft was slightly beveled and sewn end of graft to side of common carotid artery using a running 6-0 Prolene suture.  Usual flushing procedures were performed  and flow was restored to the common carotid artery.  This was an 8 mm Dacron graft.  The graft was then cut to length and beveled on the distal limb.  In similar fashion the subclavian artery was controlled proximally distally with a Henley clamps.  A longitudinal opening was made in the dome of the subclavian artery graft was cut to length and beveled and sewn endograft to side of artery using a running 6-0 Prolene suture.  There was some injury to the left thoracic duct and this was controlled with cautery.  Clamps were released and flow was restored to the left subclavian.  Hemostasis was obtained.  There was good Doppler flow in the common carotid and distal subclavian.  A 10 flat Jackson-Pratt drain was placed over the area of the previous lymphatic injury.  The scalene fat pad was tacked in place with several 3-0 Vicryl sutures.  The platysma muscle was reapproximated using a running 3-0 Vicryl suture.  The skin was closed with a 4-0 Vicryl subcuticular stitch.  The drain was sutured to the skin with a 3-0 nylon.  The patient tolerated the procedure well and there were no complications.  The instrument sponge needle count was correct at the end of the case.  The patient was awakened in the operating room taken the recovery room in stable condition and neurologically stable condition.  [name], MD [facility] Office: [phone] Pager: [phone]

## 2018-03-20 ENCOUNTER — Encounter (HOSPITAL_COMMUNITY): Payer: Self-pay | Admitting: Vascular Surgery

## 2018-03-20 LAB — BASIC METABOLIC PANEL
Anion gap: 9 (ref 5–15)
BUN: 8 mg/dL (ref 6–20)
CALCIUM: 7.8 mg/dL — AB (ref 8.9–10.3)
CHLORIDE: 107 mmol/L (ref 101–111)
CO2: 23 mmol/L (ref 22–32)
CREATININE: 0.78 mg/dL (ref 0.44–1.00)
GFR calc non Af Amer: >60 mL/min (ref >60)
Glucose, Bld: 142 mg/dL — ABNORMAL HIGH (ref 65–99)
Potassium: 2.5 mmol/L — CL (ref 3.5–5.1)
Sodium: 139 mmol/L (ref 135–145)

## 2018-03-20 LAB — CBC
HCT: 34.5 % — ABNORMAL LOW (ref 36.0–46.0)
Hemoglobin: 11.3 g/dL — ABNORMAL LOW (ref 12.0–15.0)
MCH: 28.5 pg (ref 26.0–34.0)
MCHC: 32.8 g/dL (ref 30.0–36.0)
MCV: 86.9 fL (ref 78.0–100.0)
Platelets: 375 10*3/uL (ref 150–400)
RBC: 3.97 MIL/uL (ref 3.87–5.11)
RDW: 13.9 % (ref 11.5–15.5)
WBC: 14.6 10*3/uL — ABNORMAL HIGH (ref 4.0–10.5)

## 2018-03-20 MED ORDER — POTASSIUM CHLORIDE 10 MEQ/50ML IV SOLN
10.0000 meq | INTRAVENOUS | Status: AC
  Administered 2018-03-20 (×3): 10 meq via INTRAVENOUS
  Filled 2018-03-20 (×3): qty 50

## 2018-03-20 MED ORDER — POTASSIUM CHLORIDE 10 MEQ/50ML IV SOLN: 10.0000 meq | INTRAVENOUS | Status: DC | PRN

## 2018-03-20 NOTE — Plan of Care (Signed)
Problem: Activity: Goal: Risk for activity intolerance will decrease Outcome: Progressing Pt OOB independantly, reports no difficulty walking   Problem: Pain Managment: Goal: General experience of comfort will improve Outcome: Progressing Pt understands needs to wean off IV medications. Able to control pain w/ PRN PO meds.

## 2018-03-20 NOTE — Progress Notes (Addendum)
[facility]  Subjective  - Doing OK over all, still having pain issues.   Objective 123/81 77 97.9 F (36.6 C) (Oral) 13 96%  Intake/Output Summary (Last 24 hours) at 03/20/2018 0735 Last data filed at 03/20/2018 0547 Gross per 24 hour  Intake 3230 ml  Output 2380 ml  Net 850 ml    Left radial and ulnar palpable pulse Grip grossly 5/5 left UE Left supra clavicular incision healing well without hematoma, drain in place out put 80 cc Heart RRR Lungs non labored breathing   Assessment/Planning: POD # 1 Left carotid subclavian bypass We will keep her another day for observation, maintain drain. Will try and wean off need for IV morphine and maintain pain control on PO's. K+ 2.5 will repleat  Roxy Horseman 03/20/2018 7:35 AM -- Agree with above.  Need to keep drain until less than 30 cc over 24 hours Rx hypokalemia Pain control D/c home next 1-2 days  [name], MD [facility]: [phone] Pager: [phone]  Laboratory Lab Results: Recent Labs    03/19/18 1859 03/20/18 0330  WBC 8.8 14.6*  HGB 12.7 11.3*  HCT 38.4 34.5*  PLT 399 375   BMET Recent Labs    03/19/18 0621 03/19/18 1859 03/20/18 0330  NA 142  --  139  K 2.8*  --  2.5*  CL  --   --  107  CO2  --   --  23  GLUCOSE 102*  --  142*  BUN  --   --  8  CREATININE  --  0.94 0.78  CALCIUM  --   --  7.8*    COAG Lab Results  Component Value Date   INR 0.92 03/14/2018   INR 0.96 02/26/2018   No results found for: PTT

## 2018-03-20 NOTE — Progress Notes (Signed)
CRITICAL VALUE ALERT  Critical Value:  K+ 2.5  Date & Time Notied:  3/26 0439  Provider Notified: Early, MD  Orders Received/Actions taken: new orders received; see [facility]

## 2018-03-20 NOTE — Progress Notes (Signed)
Patient ambulated independently in the hall, three times on this shift, ambulation well tolerated will continue to monitor.

## 2018-03-21 ENCOUNTER — Ambulatory Visit: Payer: Medicare Other | Admitting: Family Medicine

## 2018-03-21 ENCOUNTER — Telehealth: Payer: Self-pay | Admitting: Family Medicine

## 2018-03-21 LAB — BASIC METABOLIC PANEL
Anion gap: 10 (ref 5–15)
BUN: 8 mg/dL (ref 6–20)
CALCIUM: 8.5 mg/dL — AB (ref 8.9–10.3)
CO2: 25 mmol/L (ref 22–32)
CREATININE: 0.85 mg/dL (ref 0.44–1.00)
Chloride: 105 mmol/L (ref 101–111)
GFR calc Af Amer: >60 mL/min (ref >60)
Glucose, Bld: 134 mg/dL — ABNORMAL HIGH (ref 65–99)
Potassium: 2.6 mmol/L — CL (ref 3.5–5.1)
SODIUM: 140 mmol/L (ref 135–145)

## 2018-03-21 LAB — CBC
HEMATOCRIT: 35.5 % — AB (ref 36.0–46.0)
Hemoglobin: 11.7 g/dL — ABNORMAL LOW (ref 12.0–15.0)
MCH: 29.8 pg (ref 26.0–34.0)
MCHC: 33 g/dL (ref 30.0–36.0)
MCV: 90.6 fL (ref 78.0–100.0)
PLATELETS: 344 10*3/uL (ref 150–400)
RBC: 3.92 MIL/uL (ref 3.87–5.11)
RDW: 14.8 % (ref 11.5–15.5)
WBC: 12 10*3/uL — ABNORMAL HIGH (ref 4.0–10.5)

## 2018-03-21 LAB — MAGNESIUM: MAGNESIUM: 1.7 mg/dL (ref 1.7–2.4)

## 2018-03-21 MED ORDER — OXYCODONE HCL 10 MG PO TABS: 10.0000 mg | ORAL_TABLET | Freq: Four times a day (QID) | ORAL | 0 refills | Status: DC | PRN

## 2018-03-21 MED ORDER — POTASSIUM CHLORIDE CRYS ER 20 MEQ PO TBCR
80.0000 meq | EXTENDED_RELEASE_TABLET | Freq: Once | ORAL | Status: AC
  Administered 2018-03-21: 80 meq via ORAL
  Filled 2018-03-21: qty 4

## 2018-03-21 MED ORDER — POTASSIUM CHLORIDE 20 MEQ PO PACK
80.0000 meq | PACK | Freq: Once | ORAL | Status: DC
  Filled 2018-03-21: qty 4

## 2018-03-21 NOTE — Telephone Encounter (Signed)
Patient left message today at 12:06 pm asking to reschedule her appointment.

## 2018-03-21 NOTE — Progress Notes (Signed)
D/c instructions given to pt. Prescription given to patient. Telemetry removed. Son to escort pt home.  Clyde Canterbury, RN

## 2018-03-21 NOTE — Care Management Note (Signed)
Case Management Note Marvetta Gibbons RN, BSN Unit 4E-Case Manager [phone]  Patient Details  Name: Rita [name] MRN: [id number] Date of Birth: Feb 17, 1966  Subjective/Objective:   Pt admitted s/p Left carotid subclavian bypass               Action/Plan: PTA pt lived at home plan to return home with son to escort home- notified by Jonelle Sidle with [facility] pt had pre-op referral for any HH needs- No HH orders placed for discharge and no HH or transition needs noted - pt ambulated independently prior to discharge.   Expected Discharge Date:  03/21/18               Expected Discharge Plan:  Home/Self Care  In-House Referral:  NA  Discharge planning Services  CM Consult  Post Acute Care Choice:    Choice offered to:     DME Arranged:    DME Agency:     HH Arranged:    HH Agency:  [facility]  Status of Service:  Completed, signed off  If discussed at [facility] of Stay Meetings, dates discussed:    Discharge Disposition: home/self care   Additional Comments:  Dawayne Patricia, RN 03/21/2018, 12:12 PM

## 2018-03-21 NOTE — Progress Notes (Signed)
    CBC    Component Value Date/Time   WBC 12.0 (H) 03/21/2018 0718   RBC 3.92 03/21/2018 0718   HGB 11.7 (L) 03/21/2018 0718   HCT 35.5 (L) 03/21/2018 0718   PLT 344 03/21/2018 0718   MCV 90.6 03/21/2018 0718   MCH 29.8 03/21/2018 0718   MCHC 33.0 03/21/2018 0718   RDW 14.8 03/21/2018 0718   LYMPHSABS 1.5 02/21/2018 1545   MONOABS 0.3 02/21/2018 1545   EOSABS 0.0 02/21/2018 1545   BASOSABS 0.0 02/21/2018 1545    BMET    Component Value Date/Time   NA 140 03/21/2018 0718   K 2.6 (LL) 03/21/2018 0718   CL 105 03/21/2018 0718   CO2 25 03/21/2018 0718   GLUCOSE 134 (H) 03/21/2018 0718   BUN 8 03/21/2018 0718   CREATININE 0.85 03/21/2018 0718   CREATININE 0.94 02/08/2018 1209   CALCIUM 8.5 (L) 03/21/2018 0718   GFRNONAA >60 03/21/2018 0718   GFRAA >60 03/21/2018 0718   K+ continued to be depleted.  Patient was given 3 Meq of PO K prior to discharge home.  She will continue her home PO K+ 20 MEQ daily.      Roxy Horseman PA-C

## 2018-03-21 NOTE — Progress Notes (Signed)
CRITICAL VALUE ALERT  Critical Value:  Potassium 2.6  Date & Time Notied:  03/21/18 1735  Provider Notified: Gerri Lins, PA  Orders Received/Actions taken: Give 21meq of PO K

## 2018-03-21 NOTE — Progress Notes (Addendum)
[facility] [place]  Subjective  - Doing well.  States that her hand feels significantly better.  Still has incisional pain.  Objective 135/67 70 98 F (36.7 C) (Oral) 18 98%  Intake/Output Summary (Last 24 hours) at 03/21/2018 0726 Last data filed at 03/21/2018 0236 Gross per 24 hour  Intake 720 ml  Output 15 ml  Net 705 ml    Left clavicular incision is healing well without erythema or edema.  Drain output 15 cc clear drainage.  Drain d/c'd patient tolerated well. Left radial palpable 2+, sensation and grip intact and equal B. Heart RRR Lungs non labored breathing  Assessment/Planning: POD # 2 Left carotid subclavian bypass Patent by pass with palpable distal pulse left UE. Plan to discharge home today after am labs to ck K+.  She takes 20 meq daily.   F/U in the office in 2 weeks with Dr. Camillia [name] Rita [name] 03/21/2018 7:26 AM --  I have interviewed the patient and examined the patient. I agree with the findings by the PA. Palpable left radial pulse.  Incision looks fine.  JP had minimal drainage and was discontinued.  Rita Gallop, MD [phone]   Laboratory Lab Results: Recent Labs    03/19/18 1859 03/20/18 0330  WBC 8.8 14.6*  HGB 12.7 11.3*  HCT 38.4 34.5*  PLT 399 375   BMET Recent Labs    03/19/18 0621 03/19/18 1859 03/20/18 0330  NA 142  --  139  K 2.8*  --  2.5*  CL  --   --  107  CO2  --   --  23  GLUCOSE 102*  --  142*  BUN  --   --  8  CREATININE  --  0.94 0.78  CALCIUM  --   --  7.8*    COAG Lab Results  Component Value Date   INR 0.92 03/14/2018   INR 0.96 02/26/2018   No results found for: PTT

## 2018-03-22 NOTE — Discharge Summary (Signed)
[facility] Discharge Summary   Patient ID:  Rita [name] MRN: [id number] DOB/AGE: 04-18-1966 52 y.o.  Admit date: 03/19/2018 Discharge date: 03/21/2018 Date of Surgery: 03/19/2018 Surgeon: Surgeon(s): Elam Dutch, MD  Admission Diagnosis: Left carotid-subclavian stenosis  Discharge Diagnoses:  Left carotid-subclavian stenosis  Secondary Diagnoses: Past Medical History:  Diagnosis Date  . Arthritis    "qwhere" (02/22/2018)  . Barrett's esophagus with esophagitis 03/26/2013  . Chronic abdominal pain   . Chronic lower back pain   . Chronic pancreatitis ([place])   . COPD (chronic obstructive pulmonary disease) ([place])   . Depression   . Dyspnea   . GERD (gastroesophageal reflux disease)   . Heart murmur   . Hiatal hernia   . High cholesterol   . Hypertension   . Peptic ulcer   . Pneumonia 2000s X 1  . Tobacco use 03/26/2013    Procedure(s): BYPASS GRAFT CAROTID-SUBCLAVIAN  Discharged Condition: good  HPI: 52 y/o female with symptomatic left arm fatigue, tingling, and coolness sensations in the left hand.  Her carotid duplex revealed left subclavian stenosis.  Arm pressure could not be measured on the left side. Right arm pressure was 139.  She was scheduled for left carotid to subclavian by pass.      Hospital Course:  Rita [name] is a 52 y.o. female is S/P Left Procedure(s): BYPASS GRAFT CAROTID-SUBCLAVIAN  Post op she has increased sensation and palpable radial pulse with biphasic doppler signals.  He chief complaint was pain control requiring IV push q 2 hours with PO medication.  He post op labs revealed hypokalemia which was replenished.  She was instructed to continue all her home medication to include potassium 20 meq daily and follow up with her PCP.   Drain output 15 cc clear drainage.  Drain d/c'd patient tolerated well.    She was discharged home in stable condition with good PO pain control.  F/U in our office in 2-3 weeks with Dr.  Oneida Alar.    Significant Diagnostic Studies: CBC Lab Results  Component Value Date   WBC 12.0 (H) 03/21/2018   HGB 11.7 (L) 03/21/2018   HCT 35.5 (L) 03/21/2018   MCV 90.6 03/21/2018   PLT 344 03/21/2018    BMET    Component Value Date/Time   NA 140 03/21/2018 0718   K 2.6 (LL) 03/21/2018 0718   CL 105 03/21/2018 0718   CO2 25 03/21/2018 0718   GLUCOSE 134 (H) 03/21/2018 0718   BUN 8 03/21/2018 0718   CREATININE 0.85 03/21/2018 0718   CREATININE 0.94 02/08/2018 1209   CALCIUM 8.5 (L) 03/21/2018 0718   GFRNONAA >60 03/21/2018 0718   GFRAA >60 03/21/2018 0718   COAG Lab Results  Component Value Date   INR 0.92 03/14/2018   INR 0.96 02/26/2018     Disposition:  Discharge to :Home Discharge Instructions    Call MD for:  redness, tenderness, or signs of infection (pain, swelling, bleeding, redness, odor or green/yellow discharge around incision site)   Complete by:  As directed    Call MD for:  severe or increased pain, loss or decreased feeling  in affected limb(s)   Complete by:  As directed    Call MD for:  temperature >100.5   Complete by:  As directed    Resume previous diet   Complete by:  As directed      Allergies as of 03/21/2018   No Known Allergies     Medication List  TAKE these medications   amLODipine 5 MG tablet Commonly known as:  NORVASC Take 1 tablet (5 mg total) by mouth daily. What changed:  when to take this   aspirin EC 81 MG tablet Take 1 tablet (81 mg total) by mouth daily. What changed:  when to take this   atorvastatin 40 MG tablet Commonly known as:  LIPITOR Take 1 tablet (40 mg total) by mouth daily. What changed:  when to take this   dexlansoprazole 60 MG capsule Commonly known as:  DEXILANT Take 1 capsule (60 mg total) by mouth at bedtime.   escitalopram 10 MG tablet Commonly known as:  LEXAPRO Take 1 tablet (10 mg total) by mouth daily. What changed:  when to take this   metoCLOPramide 5 MG tablet Commonly known  as:  REGLAN Take 1 tablet (5 mg total) by mouth 3 (three) times daily before meals.   ondansetron 4 MG tablet Commonly known as:  ZOFRAN Take 1 tablet (4 mg total) by mouth every 8 (eight) hours as needed for nausea or vomiting.   Oxycodone HCl 10 MG Tabs Take 1 tablet (10 mg total) by mouth every 6 (six) hours as needed. What changed:  reasons to take this   potassium chloride SA 20 MEQ tablet Commonly known as:  K-DUR,KLOR-CON Take 20 mEq by mouth at bedtime.   traZODone 50 MG tablet Commonly known as:  DESYREL Take 1 tablet (50 mg total) by mouth at bedtime.      Verbal and written Discharge instructions given to the patient. Wound care per Discharge AVS Follow-up Information    Elam Dutch, MD Follow up in 2 week(s).   Specialties:  Vascular Surgery, Cardiology Why:  office will call Contact information: [address] [place] [address] [phone]           Signed: Roxy Horseman 03/22/2018, 1:33 PM

## 2018-03-23 ENCOUNTER — Telehealth: Payer: Self-pay

## 2018-03-23 NOTE — Telephone Encounter (Signed)
Transition Care Management Follow-up Telephone Call   Date discharged?  03/21/2018             How have you been since you were released from the hospital? Sore and tired. No problems/concerns    Do you understand why you were in the hospital? She explained exactly what her surgery was for and why she had it done.    Do you understand the discharge instructions? Yes, no questions or concerns    Where were you discharged to? Home   Items Reviewed:  Medications reviewed: meds understood and reviewed   Allergies reviewed: yes  Dietary changes reviewed: yes, no dietary restrictions  Referrals reviewed: yes   Functional Questionnaire:   Activities of Daily Living (ADLs):  Has assistance from family that checks on her throughout the day. Able to move around and complete ADLs   Any transportation issues/concerns?:  no   Any patient concerns? no   Confirmed importance and date/time of follow-up visits scheduled yes. Aware an appt will be scheduled for 1 week follow up with her PCP. We will call and schedule today.     Confirmed with patient if condition begins to worsen call PCP or go to the ER.  Patient was given the office number and encouraged to call back with question or concerns.  : patient aware of return to ER precautions and to call office with any concerns.

## 2018-03-27 ENCOUNTER — Ambulatory Visit: Payer: Medicare Other | Admitting: Family Medicine

## 2018-03-27 ENCOUNTER — Telehealth: Payer: Self-pay | Admitting: Vascular Surgery

## 2018-03-27 NOTE — Telephone Encounter (Signed)
LVM for pts appt 4/25  [facility]tr

## 2018-03-27 NOTE — Telephone Encounter (Signed)
-----   Message from Mena Goes, RN sent at 03/23/2018  9:33 AM EDT ----- Regarding: 2-3 weeks postop   ----- Message ----- From: Ulyses Amor, PA-C Sent: 03/22/2018   1:31 PM To: [facility] Charge Pool  F/U with Dr. Oneida Alar in 2-3 weeks s/p left carotid to subclavian by pass

## 2018-03-28 ENCOUNTER — Ambulatory Visit: Payer: Medicare Other | Admitting: Family Medicine

## 2018-03-30 ENCOUNTER — Other Ambulatory Visit: Payer: Self-pay | Admitting: Family Medicine

## 2018-03-30 DIAGNOSIS — F321 Major depressive disorder, single episode, moderate: Secondary | ICD-10-CM

## 2018-03-30 DIAGNOSIS — I10 Essential (primary) hypertension: Secondary | ICD-10-CM

## 2018-04-18 ENCOUNTER — Ambulatory Visit: Payer: Medicare Other | Admitting: Family Medicine

## 2018-04-19 ENCOUNTER — Encounter: Payer: Medicare Other | Admitting: Vascular Surgery

## 2018-06-01 ENCOUNTER — Encounter: Payer: Self-pay | Admitting: Family Medicine

## 2018-06-22 ENCOUNTER — Ambulatory Visit: Payer: Medicare Other | Admitting: Family Medicine

## 2018-07-24 ENCOUNTER — Telehealth: Payer: Self-pay | Admitting: Family Medicine

## 2018-07-24 ENCOUNTER — Telehealth: Payer: Self-pay | Admitting: Internal Medicine

## 2018-07-24 NOTE — Telephone Encounter (Signed)
Pt said that her pharmacy doesn't have her prescription for Dexilant and could she get Dexilant samples. Please call her at [phone]

## 2018-07-24 NOTE — Telephone Encounter (Signed)
Spoke with pt, she is going to have her pharmacy fax a refill sheet over. 1 box of Dexilant was left up front for pt.

## 2018-07-24 NOTE — Telephone Encounter (Signed)
PT lvm for [place] to call... Called and LVM for the pt to call us

## 2018-08-06 ENCOUNTER — Telehealth: Payer: Self-pay | Admitting: Internal Medicine

## 2018-08-06 NOTE — Telephone Encounter (Signed)
Routing to refill.

## 2018-08-06 NOTE — Telephone Encounter (Signed)
Pt is having problems with her pharmacy and called asking if we would send her prescription of dexilant to [facility] in [place]. [phone]

## 2018-08-08 ENCOUNTER — Telehealth: Payer: Self-pay | Admitting: Internal Medicine

## 2018-08-08 NOTE — Telephone Encounter (Signed)
Spoke with pt, she wants medication sent to [facility]. Pt is aware that message was sent to our refill box.

## 2018-08-08 NOTE — Telephone Encounter (Signed)
[phone] please call patient about her dexilant, she said last time she believes she got it in the mail and she is needing a refill

## 2018-08-08 NOTE — Telephone Encounter (Signed)
Patient would like Rx sent to [facility] in [place]

## 2018-08-09 NOTE — Telephone Encounter (Signed)
Rx was sent to [facility] in [place] per Rx request process.

## 2018-09-28 ENCOUNTER — Ambulatory Visit: Payer: Medicare Other | Admitting: Internal Medicine

## 2018-09-28 ENCOUNTER — Encounter: Payer: Self-pay | Admitting: Internal Medicine

## 2018-10-26 DIAGNOSIS — R0989 Other specified symptoms and signs involving the circulatory and respiratory systems: Secondary | ICD-10-CM | POA: Diagnosis not present

## 2018-10-26 DIAGNOSIS — Z113 Encounter for screening for infections with a predominantly sexual mode of transmission: Secondary | ICD-10-CM | POA: Diagnosis not present

## 2018-10-26 DIAGNOSIS — R0602 Shortness of breath: Secondary | ICD-10-CM | POA: Diagnosis not present

## 2018-10-26 DIAGNOSIS — I70219 Atherosclerosis of native arteries of extremities with intermittent claudication, unspecified extremity: Secondary | ICD-10-CM | POA: Diagnosis not present

## 2018-10-26 DIAGNOSIS — Z1389 Encounter for screening for other disorder: Secondary | ICD-10-CM | POA: Diagnosis not present

## 2018-10-26 DIAGNOSIS — F329 Major depressive disorder, single episode, unspecified: Secondary | ICD-10-CM | POA: Diagnosis not present

## 2018-10-26 DIAGNOSIS — Z1339 Encounter for screening examination for other mental health and behavioral disorders: Secondary | ICD-10-CM | POA: Diagnosis not present

## 2018-10-26 DIAGNOSIS — E559 Vitamin D deficiency, unspecified: Secondary | ICD-10-CM | POA: Diagnosis not present

## 2018-10-26 DIAGNOSIS — R9431 Abnormal electrocardiogram [ECG] [EKG]: Secondary | ICD-10-CM | POA: Diagnosis not present

## 2018-10-26 DIAGNOSIS — Z0000 Encounter for general adult medical examination without abnormal findings: Secondary | ICD-10-CM | POA: Diagnosis not present

## 2018-10-26 DIAGNOSIS — R5383 Other fatigue: Secondary | ICD-10-CM | POA: Diagnosis not present

## 2018-10-26 DIAGNOSIS — E7800 Pure hypercholesterolemia, unspecified: Secondary | ICD-10-CM | POA: Diagnosis not present

## 2018-10-26 DIAGNOSIS — Z79899 Other long term (current) drug therapy: Secondary | ICD-10-CM | POA: Diagnosis not present

## 2018-10-31 DIAGNOSIS — M129 Arthropathy, unspecified: Secondary | ICD-10-CM | POA: Diagnosis not present

## 2018-10-31 DIAGNOSIS — M542 Cervicalgia: Secondary | ICD-10-CM | POA: Diagnosis not present

## 2018-10-31 DIAGNOSIS — Z79899 Other long term (current) drug therapy: Secondary | ICD-10-CM | POA: Diagnosis not present

## 2018-10-31 DIAGNOSIS — M545 Low back pain: Secondary | ICD-10-CM | POA: Diagnosis not present

## 2018-10-31 DIAGNOSIS — F322 Major depressive disorder, single episode, severe without psychotic features: Secondary | ICD-10-CM | POA: Diagnosis not present

## 2018-10-31 DIAGNOSIS — N951 Menopausal and female climacteric states: Secondary | ICD-10-CM | POA: Diagnosis not present

## 2018-10-31 DIAGNOSIS — F329 Major depressive disorder, single episode, unspecified: Secondary | ICD-10-CM | POA: Diagnosis not present

## 2018-10-31 DIAGNOSIS — R209 Unspecified disturbances of skin sensation: Secondary | ICD-10-CM | POA: Diagnosis not present

## 2018-10-31 DIAGNOSIS — G8929 Other chronic pain: Secondary | ICD-10-CM | POA: Diagnosis not present

## 2018-11-08 ENCOUNTER — Telehealth: Payer: Self-pay | Admitting: Internal Medicine

## 2018-11-08 NOTE — Telephone Encounter (Signed)
I spoke with the patient and she is going to have [facility] send in her Rx refill

## 2018-11-08 NOTE — Telephone Encounter (Signed)
Lmom, waiting on a return call.  

## 2018-11-08 NOTE — Telephone Encounter (Signed)
Pt called to reschedule her appt that she missed. She asked if she could get more samples of Dexilant to hold her until her OV. [phone]

## 2018-11-29 ENCOUNTER — Encounter: Payer: Self-pay | Admitting: Internal Medicine

## 2018-12-06 DIAGNOSIS — Z79899 Other long term (current) drug therapy: Secondary | ICD-10-CM | POA: Diagnosis not present

## 2018-12-14 DIAGNOSIS — Z79899 Other long term (current) drug therapy: Secondary | ICD-10-CM | POA: Diagnosis not present

## 2019-01-01 ENCOUNTER — Ambulatory Visit: Payer: Medicare Other | Admitting: Internal Medicine

## 2019-01-18 ENCOUNTER — Ambulatory Visit: Payer: Medicare Other | Admitting: Internal Medicine

## 2019-01-18 ENCOUNTER — Encounter: Payer: Self-pay | Admitting: Internal Medicine

## 2019-01-18 ENCOUNTER — Telehealth: Payer: Self-pay | Admitting: Internal Medicine

## 2019-01-18 NOTE — Telephone Encounter (Signed)
Patient was a no show and letter sent  °

## 2019-01-20 ENCOUNTER — Emergency Department (HOSPITAL_COMMUNITY): Payer: Medicare Other

## 2019-01-20 ENCOUNTER — Inpatient Hospital Stay (HOSPITAL_COMMUNITY)
Admission: EM | Admit: 2019-01-20 | Discharge: 2019-01-24 | DRG: 064 | Disposition: A | Discharge status: Home / Self Care | Payer: Medicare Other | Attending: Family Medicine | Admitting: Family Medicine

## 2019-01-20 ENCOUNTER — Encounter (HOSPITAL_COMMUNITY): Payer: Self-pay | Admitting: Emergency Medicine

## 2019-01-20 ENCOUNTER — Observation Stay (HOSPITAL_COMMUNITY): Payer: Medicare Other

## 2019-01-20 ENCOUNTER — Other Ambulatory Visit: Payer: Self-pay

## 2019-01-20 DIAGNOSIS — J440 Chronic obstructive pulmonary disease with acute lower respiratory infection: Secondary | ICD-10-CM | POA: Diagnosis not present

## 2019-01-20 DIAGNOSIS — R918 Other nonspecific abnormal finding of lung field: Secondary | ICD-10-CM | POA: Diagnosis present

## 2019-01-20 DIAGNOSIS — J852 Abscess of lung without pneumonia: Secondary | ICD-10-CM | POA: Diagnosis not present

## 2019-01-20 DIAGNOSIS — K529 Noninfective gastroenteritis and colitis, unspecified: Secondary | ICD-10-CM | POA: Diagnosis present

## 2019-01-20 DIAGNOSIS — I472 Ventricular tachycardia: Secondary | ICD-10-CM | POA: Diagnosis present

## 2019-01-20 DIAGNOSIS — I071 Rheumatic tricuspid insufficiency: Secondary | ICD-10-CM | POA: Diagnosis present

## 2019-01-20 DIAGNOSIS — R109 Unspecified abdominal pain: Secondary | ICD-10-CM | POA: Diagnosis not present

## 2019-01-20 DIAGNOSIS — I63419 Cerebral infarction due to embolism of unspecified middle cerebral artery: Secondary | ICD-10-CM | POA: Diagnosis not present

## 2019-01-20 DIAGNOSIS — E876 Hypokalemia: Secondary | ICD-10-CM | POA: Diagnosis present

## 2019-01-20 DIAGNOSIS — K760 Fatty (change of) liver, not elsewhere classified: Secondary | ICD-10-CM | POA: Diagnosis not present

## 2019-01-20 DIAGNOSIS — I639 Cerebral infarction, unspecified: Secondary | ICD-10-CM | POA: Diagnosis not present

## 2019-01-20 DIAGNOSIS — G8929 Other chronic pain: Secondary | ICD-10-CM | POA: Diagnosis present

## 2019-01-20 DIAGNOSIS — E785 Hyperlipidemia, unspecified: Secondary | ICD-10-CM | POA: Diagnosis present

## 2019-01-20 DIAGNOSIS — E7800 Pure hypercholesterolemia, unspecified: Secondary | ICD-10-CM | POA: Diagnosis not present

## 2019-01-20 DIAGNOSIS — H53461 Homonymous bilateral field defects, right side: Secondary | ICD-10-CM | POA: Diagnosis present

## 2019-01-20 DIAGNOSIS — Z7982 Long term (current) use of aspirin: Secondary | ICD-10-CM

## 2019-01-20 DIAGNOSIS — R112 Nausea with vomiting, unspecified: Secondary | ICD-10-CM | POA: Diagnosis not present

## 2019-01-20 DIAGNOSIS — R1010 Upper abdominal pain, unspecified: Secondary | ICD-10-CM

## 2019-01-20 DIAGNOSIS — J984 Other disorders of lung: Secondary | ICD-10-CM

## 2019-01-20 DIAGNOSIS — I739 Peripheral vascular disease, unspecified: Secondary | ICD-10-CM | POA: Diagnosis present

## 2019-01-20 DIAGNOSIS — Z720 Tobacco use: Secondary | ICD-10-CM | POA: Diagnosis present

## 2019-01-20 DIAGNOSIS — R29703 NIHSS score 3: Secondary | ICD-10-CM | POA: Diagnosis present

## 2019-01-20 DIAGNOSIS — I708 Atherosclerosis of other arteries: Secondary | ICD-10-CM | POA: Diagnosis present

## 2019-01-20 DIAGNOSIS — R1013 Epigastric pain: Secondary | ICD-10-CM | POA: Diagnosis not present

## 2019-01-20 DIAGNOSIS — F329 Major depressive disorder, single episode, unspecified: Secondary | ICD-10-CM | POA: Diagnosis present

## 2019-01-20 DIAGNOSIS — Z823 Family history of stroke: Secondary | ICD-10-CM

## 2019-01-20 DIAGNOSIS — I10 Essential (primary) hypertension: Secondary | ICD-10-CM | POA: Diagnosis present

## 2019-01-20 DIAGNOSIS — F17210 Nicotine dependence, cigarettes, uncomplicated: Secondary | ICD-10-CM | POA: Diagnosis present

## 2019-01-20 DIAGNOSIS — R079 Chest pain, unspecified: Secondary | ICD-10-CM | POA: Diagnosis not present

## 2019-01-20 DIAGNOSIS — E440 Moderate protein-calorie malnutrition: Secondary | ICD-10-CM | POA: Diagnosis present

## 2019-01-20 DIAGNOSIS — M545 Low back pain: Secondary | ICD-10-CM | POA: Diagnosis present

## 2019-01-20 DIAGNOSIS — H5461 Unqualified visual loss, right eye, normal vision left eye: Secondary | ICD-10-CM | POA: Diagnosis present

## 2019-01-20 DIAGNOSIS — K219 Gastro-esophageal reflux disease without esophagitis: Secondary | ICD-10-CM | POA: Diagnosis present

## 2019-01-20 DIAGNOSIS — Z79899 Other long term (current) drug therapy: Secondary | ICD-10-CM

## 2019-01-20 DIAGNOSIS — I2699 Other pulmonary embolism without acute cor pulmonale: Secondary | ICD-10-CM

## 2019-01-20 DIAGNOSIS — R1110 Vomiting, unspecified: Secondary | ICD-10-CM | POA: Diagnosis not present

## 2019-01-20 LAB — CBC
HCT: 46.4 % — ABNORMAL HIGH (ref 36.0–46.0)
Hemoglobin: 15.5 g/dL — ABNORMAL HIGH (ref 12.0–15.0)
MCH: 27.8 pg (ref 26.0–34.0)
MCHC: 33.4 g/dL (ref 30.0–36.0)
MCV: 83.3 fL (ref 80.0–100.0)
Platelets: 570 10*3/uL — ABNORMAL HIGH (ref 150–400)
RBC: 5.57 MIL/uL — ABNORMAL HIGH (ref 3.87–5.11)
RDW: 13.1 % (ref 11.5–15.5)
WBC: 11.3 10*3/uL — ABNORMAL HIGH (ref 4.0–10.5)
nRBC: 0 % (ref 0.0–0.2)

## 2019-01-20 LAB — COMPREHENSIVE METABOLIC PANEL
ALBUMIN: 3.7 g/dL (ref 3.5–5.0)
ALK PHOS: 94 U/L (ref 38–126)
ALT: 17 U/L (ref 0–44)
AST: 18 U/L (ref 15–41)
Anion gap: 16 — ABNORMAL HIGH (ref 5–15)
BILIRUBIN TOTAL: 0.8 mg/dL (ref 0.3–1.2)
BUN: 12 mg/dL (ref 6–20)
CO2: 25 mmol/L (ref 22–32)
Calcium: 9.3 mg/dL (ref 8.9–10.3)
Chloride: 96 mmol/L — ABNORMAL LOW (ref 98–111)
Creatinine, Ser: 0.74 mg/dL (ref 0.44–1.00)
GFR calc Af Amer: >60 mL/min (ref >60)
GFR calc non Af Amer: >60 mL/min (ref >60)
GLUCOSE: 167 mg/dL — AB (ref 70–99)
Potassium: 2.9 mmol/L — ABNORMAL LOW (ref 3.5–5.1)
Sodium: 137 mmol/L (ref 135–145)
TOTAL PROTEIN: 8 g/dL (ref 6.5–8.1)

## 2019-01-20 LAB — URINALYSIS, ROUTINE W REFLEX MICROSCOPIC
BILIRUBIN URINE: NEGATIVE
Glucose, UA: NEGATIVE mg/dL
HGB URINE DIPSTICK: NEGATIVE
KETONES UR: 5 mg/dL — AB
Leukocytes, UA: NEGATIVE
Nitrite: NEGATIVE
PROTEIN: NEGATIVE mg/dL
Specific Gravity, Urine: 1.013 (ref 1.005–1.030)
pH: 8 (ref 5.0–8.0)

## 2019-01-20 LAB — LIPASE, BLOOD: Lipase: 57 U/L — ABNORMAL HIGH (ref 11–51)

## 2019-01-20 LAB — MAGNESIUM: Magnesium: 1.9 mg/dL (ref 1.7–2.4)

## 2019-01-20 MED ORDER — SODIUM CHLORIDE 0.9% FLUSH: 3.0000 mL | Freq: Once | INTRAVENOUS | Status: DC

## 2019-01-20 MED ORDER — ASPIRIN 325 MG PO TABS
325.0000 mg | ORAL_TABLET | Freq: Once | ORAL | Status: AC
  Administered 2019-01-20: 325 mg via ORAL
  Filled 2019-01-20: qty 1

## 2019-01-20 MED ORDER — HYDROMORPHONE HCL 1 MG/ML IJ SOLN
0.5000 mg | INTRAMUSCULAR | Status: DC | PRN
  Administered 2019-01-20 – 2019-01-23 (×8): 0.5 mg via INTRAVENOUS
  Filled 2019-01-20 (×8): qty 0.5

## 2019-01-20 MED ORDER — BOOST / RESOURCE BREEZE PO LIQD CUSTOM
1.0000 | Freq: Three times a day (TID) | ORAL | Status: DC
  Administered 2019-01-20 – 2019-01-24 (×10): 1 via ORAL

## 2019-01-20 MED ORDER — ONDANSETRON HCL 4 MG/2ML IJ SOLN: 4.0000 mg | Freq: Four times a day (QID) | INTRAMUSCULAR | Status: DC | PRN

## 2019-01-20 MED ORDER — SODIUM CHLORIDE 0.9 % IV SOLN
100.0000 mg | Freq: Two times a day (BID) | INTRAVENOUS | Status: DC
  Administered 2019-01-20 – 2019-01-21 (×2): 100 mg via INTRAVENOUS
  Filled 2019-01-20 (×6): qty 100

## 2019-01-20 MED ORDER — HEPARIN SODIUM (PORCINE) 5000 UNIT/ML IJ SOLN: 5000.0000 [IU] | Freq: Three times a day (TID) | INTRAMUSCULAR | Status: DC

## 2019-01-20 MED ORDER — METOCLOPRAMIDE HCL 5 MG/ML IJ SOLN
10.0000 mg | Freq: Once | INTRAMUSCULAR | Status: AC
  Administered 2019-01-20: 10 mg via INTRAVENOUS
  Filled 2019-01-20: qty 2

## 2019-01-20 MED ORDER — ONDANSETRON HCL 4 MG/2ML IJ SOLN
4.0000 mg | Freq: Once | INTRAMUSCULAR | Status: AC | PRN
  Administered 2019-01-20: 4 mg via INTRAVENOUS
  Filled 2019-01-20: qty 2

## 2019-01-20 MED ORDER — FAMOTIDINE IN NACL 20-0.9 MG/50ML-% IV SOLN
20.0000 mg | Freq: Once | INTRAVENOUS | Status: AC
  Administered 2019-01-20: 20 mg via INTRAVENOUS
  Filled 2019-01-20: qty 50

## 2019-01-20 MED ORDER — POTASSIUM CHLORIDE 10 MEQ/100ML IV SOLN
10.0000 meq | Freq: Once | INTRAVENOUS | Status: AC
  Administered 2019-01-20: 10 meq via INTRAVENOUS
  Filled 2019-01-20: qty 100

## 2019-01-20 MED ORDER — IOPAMIDOL (ISOVUE-300) INJECTION 61%
100.0000 mL | Freq: Once | INTRAVENOUS | Status: AC | PRN
  Administered 2019-01-20: 100 mL via INTRAVENOUS

## 2019-01-20 MED ORDER — SODIUM CHLORIDE 0.9 % IV SOLN
3.0000 g | Freq: Four times a day (QID) | INTRAVENOUS | Status: DC
  Administered 2019-01-20 – 2019-01-23 (×7): 3 g via INTRAVENOUS
  Filled 2019-01-20 (×20): qty 3

## 2019-01-20 MED ORDER — ATORVASTATIN CALCIUM 80 MG PO TABS
80.0000 mg | ORAL_TABLET | Freq: Every day | ORAL | Status: DC
  Administered 2019-01-20 – 2019-01-23 (×4): 80 mg via ORAL
  Filled 2019-01-20 (×2): qty 1
  Filled 2019-01-20 (×3): qty 2

## 2019-01-20 MED ORDER — HYDROMORPHONE HCL 1 MG/ML IJ SOLN
1.0000 mg | Freq: Once | INTRAMUSCULAR | Status: AC
  Administered 2019-01-20: 1 mg via INTRAVENOUS
  Filled 2019-01-20: qty 1

## 2019-01-20 MED ORDER — ONDANSETRON HCL 4 MG PO TABS
4.0000 mg | ORAL_TABLET | Freq: Four times a day (QID) | ORAL | Status: DC | PRN
  Administered 2019-01-24 (×2): 4 mg via ORAL
  Filled 2019-01-20 (×2): qty 1

## 2019-01-20 MED ORDER — FAMOTIDINE IN NACL 20-0.9 MG/50ML-% IV SOLN
20.0000 mg | Freq: Two times a day (BID) | INTRAVENOUS | Status: DC
  Administered 2019-01-20: 20 mg via INTRAVENOUS
  Filled 2019-01-20 (×2): qty 50

## 2019-01-20 MED ORDER — SODIUM CHLORIDE 0.9 % IV BOLUS
1000.0000 mL | Freq: Once | INTRAVENOUS | Status: AC
  Administered 2019-01-20: 1000 mL via INTRAVENOUS

## 2019-01-20 MED ORDER — POTASSIUM CHLORIDE IN NACL 40-0.9 MEQ/L-% IV SOLN
INTRAVENOUS | Status: AC
  Administered 2019-01-20: 125 mL/h via INTRAVENOUS
  Filled 2019-01-20 (×4): qty 1000

## 2019-01-20 MED ORDER — ONDANSETRON HCL 4 MG/2ML IJ SOLN
4.0000 mg | Freq: Once | INTRAMUSCULAR | Status: AC
  Administered 2019-01-20: 4 mg via INTRAVENOUS
  Filled 2019-01-20: qty 2

## 2019-01-20 MED ORDER — ASPIRIN EC 81 MG PO TBEC
81.0000 mg | DELAYED_RELEASE_TABLET | Freq: Every day | ORAL | Status: DC
  Administered 2019-01-21 – 2019-01-24 (×4): 81 mg via ORAL
  Filled 2019-01-20 (×4): qty 1

## 2019-01-20 MED ORDER — PANTOPRAZOLE SODIUM 40 MG IV SOLR
40.0000 mg | Freq: Once | INTRAVENOUS | Status: AC
  Administered 2019-01-20: 40 mg via INTRAVENOUS
  Filled 2019-01-20: qty 40

## 2019-01-20 MED ORDER — POTASSIUM CHLORIDE CRYS ER 20 MEQ PO TBCR
40.0000 meq | EXTENDED_RELEASE_TABLET | Freq: Once | ORAL | Status: AC
  Administered 2019-01-20: 40 meq via ORAL
  Filled 2019-01-20: qty 2

## 2019-01-20 NOTE — ED Triage Notes (Signed)
Patient c/o generalized abd pain with nausea, vomiting, severe watery diarrhea, fevers, and decreased urinary output. Patient goes to Dr Sydell Axon for pancreatitis. Patient went to see him when pain started but he was unable to assess her in office because hegot called to attend to an emergency surgery.

## 2019-01-20 NOTE — ED Provider Notes (Signed)
[facility] EMERGENCY DEPARTMENT Provider Note   CSN: [id number] Arrival date & time: 01/20/19  1829     History   Chief Complaint Chief Complaint  Patient presents with  . Abdominal Pain    HPI Rita [name] is a 53 y.o. female.  Patient complains of abdominal pain.  She has a history of severe gastritis and esophagitis  The history is provided by the patient. A language interpreter was used.  Abdominal Pain  Pain location:  Epigastric Pain quality: aching   Pain radiates to:  Does not radiate Pain severity:  Severe Onset quality:  Sudden Timing:  Constant Progression:  Waxing and waning Chronicity:  Recurrent Context: not alcohol use   Relieved by:  Nothing Worsened by:  Nothing Associated symptoms: vomiting   Associated symptoms: no chest pain, no cough, no diarrhea, no fatigue and no hematuria     Past Medical History:  Diagnosis Date  . Arthritis    "qwhere" (02/22/2018)  . Barrett's esophagus with esophagitis 03/26/2013  . Chronic abdominal pain   . Chronic lower back pain   . Chronic pancreatitis ([place])   . COPD (chronic obstructive pulmonary disease) ([place])   . Depression   . Dyspnea   . GERD (gastroesophageal reflux disease)   . Heart murmur   . Hiatal hernia   . High cholesterol   . Hypertension   . Peptic ulcer   . Pneumonia 2000s X 1  . Tobacco use 03/26/2013    Patient Active Problem List   Diagnosis Date Noted  . Gastroenteritis 01/20/2019  . Stenosis of left subclavian artery ([place]) 03/19/2018  . Chest pain 02/21/2018  . Left subclavian artery occlusion 02/21/2018  . Rectal bleeding 03/21/2017  . Hiatal hernia with gastroesophageal reflux disease and esophagitis   . Biliary pain   . Pain of upper abdomen   . Elevated liver enzymes   . Chronic abdominal pain   . Pancreatitis 02/06/2015  . Acute pancreatitis   . Elevated LFTs   . Pancreatitis, acute 05/09/2013  . Anemia 03/27/2013  . Esophageal dysphagia 03/26/2013  . Chronic back pain  03/26/2013  . GERD (gastroesophageal reflux disease) 03/26/2013  . Tobacco use 03/26/2013  . Barrett's esophagus with esophagitis 03/26/2013  . Elevated lipase 08/20/2011    Past Surgical History:  Procedure Laterality Date  . AORTIC ARCH ANGIOGRAPHY N/A 01/19/2018   Procedure: AORTIC ARCH ANGIOGRAPHY;  Surgeon: Elam Dutch, MD;  Location: [place] [facility];  Service: Cardiovascular;  Laterality: N/A;  . BIOPSY  04/17/2017   Procedure: BIOPSY;  Surgeon: Daneil Dolin, MD;  [facility];  Service: Endoscopy;;  esophageal  . CARDIAC CATHETERIZATION    . CAROTID-SUBCLAVIAN BYPASS GRAFT Left 03/19/2018   Procedure: BYPASS GRAFT CAROTID-SUBCLAVIAN;  Surgeon: Elam Dutch, MD;  Location: [facility];  Service: Vascular;  Laterality: Left;  . [place]; 1988  . COLONOSCOPY WITH PROPOFOL N/A 04/17/2017   Procedure: COLONOSCOPY WITH PROPOFOL;  Surgeon: Daneil Dolin, MD;  Location: [facility];  Service: Endoscopy;  Laterality: N/A;  100  . ESOPHAGOGASTRODUODENOSCOPY  Oct 2011   Dr. Laural Golden: ulcer in distal esophagus, soft stricture at GE junction s/p balloon dilation PATH: BARRETT'S  . ESOPHAGOGASTRODUODENOSCOPY (EGD) WITH ESOPHAGEAL DILATION N/A 03/27/2013   Procedure: ESOPHAGOGASTRODUODENOSCOPY (EGD) WITH ESOPHAGEAL DILATION;  Surgeon: Daneil Dolin, MD;  [facility];  Service: Endoscopy;  Laterality: N/A;  possible dilation  . ESOPHAGOGASTRODUODENOSCOPY (EGD) WITH PROPOFOL N/A 01/02/2017   Procedure:  ESOPHAGOGASTRODUODENOSCOPY (EGD) WITH PROPOFOL;  Surgeon: Daneil Dolin, MD;  [facility];  Service: Endoscopy;  Laterality: N/A;  with possible esophageal dilation  . ESOPHAGOGASTRODUODENOSCOPY (EGD) WITH PROPOFOL N/A 04/17/2017   Procedure: ESOPHAGOGASTRODUODENOSCOPY (EGD) WITH PROPOFOL;  Surgeon: Daneil Dolin, MD;  [facility];  Service: Endoscopy;  Laterality: N/A;  . EUS N/A 05/09/2013   Procedure: UPPER ENDOSCOPIC ULTRASOUND  (EUS) LINEAR;  Surgeon: Milus Banister, MD;  Location: [facility];  Service: Endoscopy;  Laterality: N/A;  . FRACTURE SURGERY     pt. denies  . KNEE ARTHROSCOPY Right   . LAPAROSCOPIC CHOLECYSTECTOMY    . LEFT HEART CATH AND CORONARY ANGIOGRAPHY N/A 02/26/2018   Procedure: LEFT HEART CATH AND CORONARY ANGIOGRAPHY;  Surgeon: Burnell Blanks, MD;  Location: [place] [facility];  Service: Cardiovascular;  Laterality: N/A;  . Venia Minks DILATION N/A 04/17/2017   Procedure: Venia Minks DILATION;  Surgeon: Daneil Dolin, MD;  [facility];  Service: Endoscopy;  Laterality: N/A;  . PATELLA FRACTURE SURGERY Left   . POLYPECTOMY  04/17/2017   Procedure: POLYPECTOMY;  Surgeon: Daneil Dolin, MD;  [facility];  Service: Endoscopy;;  . TUBAL LIGATION  1992  . UPPER EXTREMITY ANGIOGRAPHY N/A 01/19/2018   Procedure: UPPER EXTREMITY ANGIOGRAPHY;  Surgeon: Elam Dutch, MD;  Location: [place] [facility];  Service: Cardiovascular;  Laterality: N/A;     OB History    Gravida  4   Para  3   Term  3   Preterm      AB  1   Living  3     SAB  1   TAB      Ectopic      Multiple      Live Births               Home Medications    Prior to Admission medications   Medication Sig Start Date End Date Taking? Authorizing Provider  amLODipine (NORVASC) 5 MG tablet Take 1 tablet (5 mg total) by mouth daily. Patient taking differently: Take 5 mg by mouth at bedtime.  03/06/18   Caren Macadam, MD  amLODipine (NORVASC) 5 MG tablet TAKE 1 TABLET BY MOUTH DAILY 04/02/18   Caren Macadam, MD  aspirin EC 81 MG tablet Take 1 tablet (81 mg total) by mouth daily. Patient taking differently: Take 81 mg by mouth at bedtime.  03/06/18   Caren Macadam, MD  atorvastatin (LIPITOR) 40 MG tablet Take 1 tablet (40 mg total) by mouth daily. Patient taking differently: Take 40 mg by mouth at bedtime.  03/06/18   Caren Macadam, MD  dexlansoprazole (DEXILANT) 60 MG capsule Take 1 capsule  (60 mg total) by mouth at bedtime. 03/16/18   Carlis Stable, NP  escitalopram (LEXAPRO) 10 MG tablet Take 1 tablet (10 mg total) by mouth daily. Patient taking differently: Take 10 mg by mouth at bedtime.  03/06/18   Caren Macadam, MD  escitalopram (LEXAPRO) 10 MG tablet TAKE 1 TABLET BY MOUTH DAILY 04/02/18   Caren Macadam, MD  metoCLOPramide (REGLAN) 5 MG tablet Take 1 tablet (5 mg total) by mouth 3 (three) times daily before meals. 03/06/18   Caren Macadam, MD  ondansetron (ZOFRAN) 4 MG tablet Take 1 tablet (4 mg total) by mouth every 8 (eight) hours as needed for nausea or vomiting. 03/01/18   Annitta Needs, NP  Oxycodone HCl 10 MG TABS Take 1 tablet (10 mg total)  by mouth every 6 (six) hours as needed. 03/21/18   Ulyses Amor, PA-C  potassium chloride SA (K-DUR,KLOR-CON) 20 MEQ tablet Take 20 mEq by mouth at bedtime.     [provider]  traZODone (DESYREL) 50 MG tablet Take 1 tablet (50 mg total) by mouth at bedtime. 03/06/18   Caren Macadam, MD    Family History Family History  Problem Relation Age of Onset  . Asthma Mother   . Heart failure Mother   . Cancer Mother        pancreatic  . Diabetes Mother   . Hypertension Mother   . Stroke Mother   . Pancreatic cancer Mother        deceased  . Heart failure Father   . Diabetes Father   . Colon cancer Neg Hx     Social History Social History   Tobacco Use  . Smoking status: Current Every Day Smoker    Packs/day: 0.50    Years: 41.00    Pack years: 20.50    Types: Cigarettes    Start date: 09/26/1982  . Smokeless tobacco: Never Used  Substance Use Topics  . Alcohol use: No  . Drug use: No     Allergies   Patient has no known allergies.   Review of Systems Review of Systems  Constitutional: Negative for appetite change and fatigue.  HENT: Negative for congestion, ear discharge and sinus pressure.   Eyes: Negative for discharge.  Respiratory: Negative for cough.   Cardiovascular: Negative for chest pain.    Gastrointestinal: Positive for abdominal pain and vomiting. Negative for diarrhea.  Genitourinary: Negative for frequency and hematuria.  Musculoskeletal: Negative for back pain.  Skin: Negative for rash.  Neurological: Negative for seizures and headaches.  Psychiatric/Behavioral: Negative for hallucinations.     Physical Exam Updated Vital Signs BP 93/70   Pulse 78   Temp 97.8 F (36.6 C) (Oral)   Resp 17   Ht 5\' 7"  (1.702 m)   Wt 84.3 kg   LMP 02/09/2012   SpO2 99%   BMI 29.11 kg/m   Physical Exam Vitals signs and nursing note reviewed.  Constitutional:      Appearance: She is well-developed.  HENT:     Head: Normocephalic.     Nose: Nose normal.  Eyes:     General: No scleral icterus.    Conjunctiva/sclera: Conjunctivae normal.  Neck:     Musculoskeletal: Neck supple.     Thyroid: No thyromegaly.  Cardiovascular:     Rate and Rhythm: Normal rate and regular rhythm.     Heart sounds: No murmur. No friction rub. No gallop.   Pulmonary:     Breath sounds: No stridor. No wheezing or rales.  Chest:     Chest wall: No tenderness.  Abdominal:     General: There is no distension.     Tenderness: There is abdominal tenderness. There is no rebound.  Musculoskeletal: Normal range of motion.  Lymphadenopathy:     Cervical: No cervical adenopathy.  Skin:    Findings: No erythema or rash.  Neurological:     Mental Status: She is oriented to person, place, and time.     Motor: No abnormal muscle tone.     Coordination: Coordination normal.  Psychiatric:        Behavior: Behavior normal.      ED Treatments / Results  Labs (all labs ordered are listed, but only abnormal results are displayed) Labs Reviewed  LIPASE, BLOOD -  Abnormal; Notable for the following components:      Result Value   Lipase 57 (*)    All other components within normal limits  COMPREHENSIVE METABOLIC PANEL - Abnormal; Notable for the following components:   Potassium 2.9 (*)    Chloride  96 (*)    Glucose, Bld 167 (*)    Anion gap 16 (*)    All other components within normal limits  CBC - Abnormal; Notable for the following components:   WBC 11.3 (*)    RBC 5.57 (*)    Hemoglobin 15.5 (*)    HCT 46.4 (*)    Platelets 570 (*)    All other components within normal limits  URINALYSIS, ROUTINE W REFLEX MICROSCOPIC - Abnormal; Notable for the following components:   Ketones, ur 5 (*)    All other components within normal limits  C DIFFICILE QUICK SCREEN W PCR REFLEX  MAGNESIUM    EKG None  Radiology Ct Chest W Contrast  Result Date: 01/20/2019 CLINICAL DATA:  Nausea and vomiting with pain, initial encounter EXAM: CT CHEST, ABDOMEN, AND PELVIS WITH CONTRAST TECHNIQUE: Multidetector CT imaging of the chest, abdomen and pelvis was performed following the standard protocol during bolus administration of intravenous contrast. CONTRAST:  137mL ISOVUE-300 IOPAMIDOL (ISOVUE-300) INJECTION 61% COMPARISON:  Plain film from earlier in the same day as well as CT from 02/21/2018 FINDINGS: CT CHEST FINDINGS Cardiovascular: Thoracic aorta is within normal limits. No cardiac enlargement is seen. The pulmonary artery as visualized is within normal limits. 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 Opacification of the more distal subclavian artery is noted secondary to a left carotid to subclavian bypass. Mediastinum/Nodes: Thoracic inlet is within normal limits.The esophagus demonstrates some diffuse wall thickening likely related to underlying reflux. The overall appearance is stable from the prior exam. No left hilar adenopathy is noted. No significant mediastinal adenopathy is noted. Scattered small right hilar lymph nodes are seen likely reactive in nature. Lungs/Pleura: Lungs are well aerated bilaterally. A few scattered calcified granulomas are seen. In the superior segment of the right lower lobe, there is a focal 3.8 x 4.3 cm  mass lesion with central decreased attenuation identified consistent with fluid. Some suggestion of air within the central portion of the lesion is noted on the coronal and sagittal reconstructions . The lungs are otherwise clear. No sizable effusion is noted. Musculoskeletal: No acute bony abnormality is noted. CT ABDOMEN PELVIS FINDINGS Hepatobiliary: Fatty infiltration of the liver is noted. The gallbladder has been surgically removed. Pancreas: Unremarkable. No pancreatic ductal dilatation or surrounding inflammatory changes. Spleen: Normal in size without focal abnormality. Adrenals/Urinary Tract: Adrenal glands are within normal limits. Kidneys are well visualized bilaterally without renal calculi or obstructive changes. Ureters are within normal limits. The bladder is partially distended. Stomach/Bowel: The appendix is within normal limits. No obstructive or inflammatory changes of the bowel are seen. Small sliding-type hiatal hernia is noted as well as the aforementioned esophageal thickening distally consistent with reflux disease. Vascular/Lymphatic: Aortic atherosclerosis. No enlarged abdominal or pelvic lymph nodes. Reproductive: Uterus and bilateral adnexa are unremarkable. Other: No abdominal wall hernia or abnormality. No abdominopelvic ascites. Musculoskeletal: Degenerative changes of the lumbar spine are noted. IMPRESSION: Rounded masslike density in the superior segment of the right lower lobe as described. There is central decreased attenuation as well as a few foci of what appears to be air within on the sagittal and coronal reconstructions. Although this  could represent a necrotic neoplasm, short-term treatment for necrotic pneumonia/lung abscess is recommended with repeat imaging to assess for resolution. If the lesion persists on subsequent imaging more aggressive workup can be performed. Chronic changes as described above. Electronically Signed   By: Inez Catalina M.D.   On: 01/20/2019 15:56     Ct Abdomen Pelvis W Contrast  Result Date: 01/20/2019 CLINICAL DATA:  Nausea and vomiting with pain, initial encounter EXAM: CT CHEST, ABDOMEN, AND PELVIS WITH CONTRAST TECHNIQUE: Multidetector CT imaging of the chest, abdomen and pelvis was performed following the standard protocol during bolus administration of intravenous contrast. CONTRAST:  170mL ISOVUE-300 IOPAMIDOL (ISOVUE-300) INJECTION 61% COMPARISON:  Plain film from earlier in the same day as well as CT from 02/21/2018 FINDINGS: CT CHEST FINDINGS Cardiovascular: Thoracic aorta is within normal limits. No cardiac enlargement is seen. The pulmonary artery as visualized is within normal limits. 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 Opacification of the more distal subclavian artery is noted secondary to a left carotid to subclavian bypass. Mediastinum/Nodes: Thoracic inlet is within normal limits.The esophagus demonstrates some diffuse wall thickening likely related to underlying reflux. The overall appearance is stable from the prior exam. No left hilar adenopathy is noted. No significant mediastinal adenopathy is noted. Scattered small right hilar lymph nodes are seen likely reactive in nature. Lungs/Pleura: Lungs are well aerated bilaterally. A few scattered calcified granulomas are seen. In the superior segment of the right lower lobe, there is a focal 3.8 x 4.3 cm mass lesion with central decreased attenuation identified consistent with fluid. Some suggestion of air within the central portion of the lesion is noted on the coronal and sagittal reconstructions . The lungs are otherwise clear. No sizable effusion is noted. Musculoskeletal: No acute bony abnormality is noted. CT ABDOMEN PELVIS FINDINGS Hepatobiliary: Fatty infiltration of the liver is noted. The gallbladder has been surgically removed. Pancreas: Unremarkable. No pancreatic ductal dilatation or surrounding inflammatory  changes. Spleen: Normal in size without focal abnormality. Adrenals/Urinary Tract: Adrenal glands are within normal limits. Kidneys are well visualized bilaterally without renal calculi or obstructive changes. Ureters are within normal limits. The bladder is partially distended. Stomach/Bowel: The appendix is within normal limits. No obstructive or inflammatory changes of the bowel are seen. Small sliding-type hiatal hernia is noted as well as the aforementioned esophageal thickening distally consistent with reflux disease. Vascular/Lymphatic: Aortic atherosclerosis. No enlarged abdominal or pelvic lymph nodes. Reproductive: Uterus and bilateral adnexa are unremarkable. Other: No abdominal wall hernia or abnormality. No abdominopelvic ascites. Musculoskeletal: Degenerative changes of the lumbar spine are noted. IMPRESSION: Rounded masslike density in the superior segment of the right lower lobe as described. There is central decreased attenuation as well as a few foci of what appears to be air within on the sagittal and coronal reconstructions. Although this could represent a necrotic neoplasm, short-term treatment for necrotic pneumonia/lung abscess is recommended with repeat imaging to assess for resolution. If the lesion persists on subsequent imaging more aggressive workup can be performed. Chronic changes as described above. Electronically Signed   By: Inez Catalina M.D.   On: 01/20/2019 15:56   Dg Abd Acute 2+v W 1v Chest  Result Date: 01/20/2019 CLINICAL DATA:  Abdominal pain, nausea, vomiting, severe watery diarrhea, fevers and decreased urinary output. Smoker. EXAM: DG ABDOMEN ACUTE W/ 1V CHEST COMPARISON:  Portable chest dated 03/19/2018, chest CTA dated 02/21/2018 and abdomen and pelvis CT dated 07/19/2017. FINDINGS: Normal sized  heart. Interval 4.2 cm rounded mass-like density with poorly defined margins in the right mid lung on the frontal view of the chest. Moderate diffuse peribronchial thickening  and accentuation of the interstitial markings with mild progression. Normal bowel gas pattern without free peritoneal air. Cholecystectomy clips. Minimal lumbar spine degenerative changes. IMPRESSION: 1. Interval 4.2 cm rounded mass-like density in the right mid lung. This is concerning for a primary lung neoplasm. A rounded area of pneumonia is less likely. Further evaluation with a chest CT with contrast is recommended. 2. Progressive bronchitic changes. 3. No acute abdominal abnormality. Electronically Signed   By: Claudie Revering M.D.   On: 01/20/2019 13:18    Procedures Procedures (including critical care time)  Medications Ordered in ED Medications  sodium chloride flush (NS) 0.9 % injection 3 mL (3 mLs Intravenous Not Given 01/20/19 1047)  potassium chloride 10 mEq in 100 mL IVPB (has no administration in time range)  0.9 % NaCl with KCl 40 mEq / L  infusion (has no administration in time range)  ondansetron (ZOFRAN) injection 4 mg (4 mg Intravenous Given 01/20/19 1026)  sodium chloride 0.9 % bolus 1,000 mL (0 mLs Intravenous Stopped 01/20/19 1325)  HYDROmorphone (DILAUDID) injection 1 mg (1 mg Intravenous Given 01/20/19 1225)  ondansetron (ZOFRAN) injection 4 mg (4 mg Intravenous Given 01/20/19 1225)  famotidine (PEPCID) IVPB 20 mg premix (0 mg Intravenous Stopped 01/20/19 1257)  metoCLOPramide (REGLAN) injection 10 mg (10 mg Intravenous Given 01/20/19 1423)  potassium chloride 10 mEq in 100 mL IVPB ( Intravenous Stopped 01/20/19 1514)  pantoprazole (PROTONIX) injection 40 mg (40 mg Intravenous Given 01/20/19 1425)  iopamidol (ISOVUE-300) 61 % injection 100 mL (100 mLs Intravenous Contrast Given 01/20/19 1516)     Initial Impression / Assessment and Plan / ED Course  I have reviewed the triage vital signs and the nursing notes.  Pertinent labs & imaging results that were available during my care of the patient were reviewed by me and considered in my medical decision making (see chart for  details).     Patient with severe gastritis and esophagitis.  With persistent vomiting.  She also has what appears to be lesion in her lungs that could be pneumonia or possibly tumor cancer.  She will be admitted to medicine for treatment of her abdominal pain and evaluation of her lung mass  Final Clinical Impressions(s) / ED Diagnoses   Final diagnoses:  Pain of upper abdomen    ED Discharge Orders    None       Milton Ferguson, MD 01/20/19 1719

## 2019-01-20 NOTE — ED Notes (Signed)
No D/ noted since pt has been in ED.

## 2019-01-20 NOTE — ED Notes (Signed)
Cannot give urine sample at this time.

## 2019-01-20 NOTE — ED Notes (Signed)
Pt unable to provide urine sample at this time 

## 2019-01-20 NOTE — ED Notes (Addendum)
Pt now c/o R sided vision changes, Admitting MD at bedside ordered stat CT Head. States that sx have been ongoing for several weeks. VANS negative.

## 2019-01-20 NOTE — H&P (Addendum)
History and Physical    Rita [name] TXM:[id number] DOB: 1966-10-20 DOA: 01/20/2019  PCP: System, Pcp Not In   Patient coming from: Home  I have personally briefly reviewed patient's old medical records in [place]  Chief Complaint: Vomiting, loose stools, abdominal pain  HPI: Rita [name] is a 53 y.o. female with medical history significant for pancreatitis, esophagitis with dysphagia, left subclavian artery occlusion, who presented to the ED with complaints of multiple episodes of vomiting and loose stools daily over the past 2 weeks.  She reports ~18 loose stools yesterday, and multiple episodes of vomiting today, with some improvement with antiemetics.  In the ED.  She reports persistent abdominal pain, radiating to her back with mild improvement with pain medication given in the ED.  No recent antibiotic use in the past at least 3 weeks Patient also reports loss of peripheral vision over the past 2 weeks, significant other present at bedside also reports change in patient's gait.  Patient reports generalized headaches over the past 2 weeks also. No difficulty breathing no cough, no fevers but reports subjective chills.  No chest pain.  No family history of lung cancer or other malignancies.  Patient smokes half a pack of cigarettes daily.  ED Course: Stable vitals. K  low -2.9.  Otherwise unremarkable CMP.  Lipase 57.  UA clean.  Abdominal and chest x-ray with suggested possible primary lung neoplasm, subsequent chest and abdomino-pelvic CT with contrast -, rounded masslike density in the superior segment right lower lobe, possibly necrotic neoplasm, necrotic no pneumonia, all lung abscess.  Short-term treatment and repeat imaging to assess for resolution recommended.  Review of Systems: As per HPI all other systems reviewed and negative.  Past Medical History:  Diagnosis Date  . Arthritis    "qwhere" (02/22/2018)  . Barrett's esophagus with esophagitis 03/26/2013  . Chronic  abdominal pain   . Chronic lower back pain   . Chronic pancreatitis ([employer])   . COPD (chronic obstructive pulmonary disease) ([place])   . Depression   . Dyspnea   . GERD (gastroesophageal reflux disease)   . Heart murmur   . Hiatal hernia   . High cholesterol   . Hypertension   . Peptic ulcer   . Pneumonia 2000s X 1  . Tobacco use 03/26/2013    Past Surgical History:  Procedure Laterality Date  . AORTIC ARCH ANGIOGRAPHY N/A 01/19/2018   Procedure: AORTIC ARCH ANGIOGRAPHY;  Surgeon: Elam Dutch, MD;  Location: [facility];  Service: Cardiovascular;  Laterality: N/A;  . BIOPSY  04/17/2017   Procedure: BIOPSY;  Surgeon: Daneil Dolin, MD;  [facility];  Service: Endoscopy;;  esophageal  . CARDIAC CATHETERIZATION    . CAROTID-SUBCLAVIAN BYPASS GRAFT Left 03/19/2018   Procedure: BYPASS GRAFT CAROTID-SUBCLAVIAN;  Surgeon: Elam Dutch, MD;  Location: [facility] OR;  Service: Vascular;  Laterality: Left;  . [place]; 1988  . COLONOSCOPY WITH PROPOFOL N/A 04/17/2017   Procedure: COLONOSCOPY WITH PROPOFOL;  Surgeon: Daneil Dolin, MD;  [facility];  Service: Endoscopy;  Laterality: N/A;  100  . ESOPHAGOGASTRODUODENOSCOPY  Oct 2011   Dr. Laural Golden: ulcer in distal esophagus, soft stricture at GE junction s/p balloon dilation PATH: BARRETT'S  . ESOPHAGOGASTRODUODENOSCOPY (EGD) WITH ESOPHAGEAL DILATION N/A 03/27/2013   Procedure: ESOPHAGOGASTRODUODENOSCOPY (EGD) WITH ESOPHAGEAL DILATION;  Surgeon: Daneil Dolin, MD;  [facility];  Service: Endoscopy;  Laterality: N/A;  possible dilation  . ESOPHAGOGASTRODUODENOSCOPY (  EGD) WITH PROPOFOL N/A 01/02/2017   Procedure: ESOPHAGOGASTRODUODENOSCOPY (EGD) WITH PROPOFOL;  Surgeon: Daneil Dolin, MD;  [facility];  Service: Endoscopy;  Laterality: N/A;  with possible esophageal dilation  . ESOPHAGOGASTRODUODENOSCOPY (EGD) WITH PROPOFOL N/A 04/17/2017   Procedure: ESOPHAGOGASTRODUODENOSCOPY (EGD)  WITH PROPOFOL;  Surgeon: Daneil Dolin, MD;  [facility];  Service: Endoscopy;  Laterality: N/A;  . EUS N/A 05/09/2013   Procedure: UPPER ENDOSCOPIC ULTRASOUND (EUS) LINEAR;  Surgeon: Milus Banister, MD;  Location: [facility];  Service: Endoscopy;  Laterality: N/A;  . FRACTURE SURGERY     pt. denies  . KNEE ARTHROSCOPY Right   . LAPAROSCOPIC CHOLECYSTECTOMY    . LEFT HEART CATH AND CORONARY ANGIOGRAPHY N/A 02/26/2018   Procedure: LEFT HEART CATH AND CORONARY ANGIOGRAPHY;  Surgeon: Burnell Blanks, MD;  Location: [facility];  Service: Cardiovascular;  Laterality: N/A;  . Venia Minks DILATION N/A 04/17/2017   Procedure: Venia Minks DILATION;  Surgeon: Daneil Dolin, MD;  [facility];  Service: Endoscopy;  Laterality: N/A;  . PATELLA FRACTURE SURGERY Left   . POLYPECTOMY  04/17/2017   Procedure: POLYPECTOMY;  Surgeon: Daneil Dolin, MD;  [facility];  Service: Endoscopy;;  . TUBAL LIGATION  1992  . UPPER EXTREMITY ANGIOGRAPHY N/A 01/19/2018   Procedure: UPPER EXTREMITY ANGIOGRAPHY;  Surgeon: Elam Dutch, MD;  Location: [facility];  Service: Cardiovascular;  Laterality: N/A;     reports that she has been smoking cigarettes. She started smoking about 36 years ago. She has a 20.50 pack-year smoking history. She has never used smokeless tobacco. She reports that she does not drink alcohol or use drugs.  No Known Allergies  Family History  Problem Relation Age of Onset  . Asthma Mother   . Heart failure Mother   . Cancer Mother        pancreatic  . Diabetes Mother   . Hypertension Mother   . Stroke Mother   . Pancreatic cancer Mother        deceased  . Heart failure Father   . Diabetes Father   . Colon cancer Neg Hx     Prior to Admission medications   Medication Sig Start Date End Date Taking? Authorizing Provider  amLODipine (NORVASC) 5 MG tablet Take 1 tablet (5 mg total) by mouth daily. Patient taking differently: Take 5  mg by mouth at bedtime.  03/06/18   Caren Macadam, MD  amLODipine (NORVASC) 5 MG tablet TAKE 1 TABLET BY MOUTH DAILY 04/02/18   Caren Macadam, MD  aspirin EC 81 MG tablet Take 1 tablet (81 mg total) by mouth daily. Patient taking differently: Take 81 mg by mouth at bedtime.  03/06/18   Caren Macadam, MD  atorvastatin (LIPITOR) 40 MG tablet Take 1 tablet (40 mg total) by mouth daily. Patient taking differently: Take 40 mg by mouth at bedtime.  03/06/18   Caren Macadam, MD  dexlansoprazole (DEXILANT) 60 MG capsule Take 1 capsule (60 mg total) by mouth at bedtime. 03/16/18   Carlis Stable, NP  escitalopram (LEXAPRO) 10 MG tablet Take 1 tablet (10 mg total) by mouth daily. Patient taking differently: Take 10 mg by mouth at bedtime.  03/06/18   Caren Macadam, MD  escitalopram (LEXAPRO) 10 MG tablet TAKE 1 TABLET BY MOUTH DAILY 04/02/18   Caren Macadam, MD  metoCLOPramide (REGLAN) 5 MG tablet Take 1 tablet (5 mg total) by mouth 3 (three) times daily before meals.  03/06/18   Caren Macadam, MD  ondansetron (ZOFRAN) 4 MG tablet Take 1 tablet (4 mg total) by mouth every 8 (eight) hours as needed for nausea or vomiting. 03/01/18   Annitta Needs, NP  Oxycodone HCl 10 MG TABS Take 1 tablet (10 mg total) by mouth every 6 (six) hours as needed. 03/21/18   Ulyses Amor, PA-C  potassium chloride SA (K-DUR,KLOR-CON) 20 MEQ tablet Take 20 mEq by mouth at bedtime.     [provider]  traZODone (DESYREL) 50 MG tablet Take 1 tablet (50 mg total) by mouth at bedtime. 03/06/18   Caren Macadam, MD    Physical Exam: Vitals:   01/20/19 1224 01/20/19 1330 01/20/19 1430 01/20/19 1530  BP: (!) 181/91 (!) 168/88 (!) 162/87 122/67  Pulse: 79 84 85 93  Resp: 20 16 14 20   Temp:      TempSrc:      SpO2: 100% 100% 100% 100%  Weight:      Height:        Constitutional: tearful, but appears comfortable Vitals:   01/20/19 1224 01/20/19 1330 01/20/19 1430 01/20/19 1530  BP: (!) 181/91 (!) 168/88 (!) 162/87 122/67    Pulse: 79 84 85 93  Resp: 20 16 14 20   Temp:      TempSrc:      SpO2: 100% 100% 100% 100%  Weight:      Height:       Eyes: PERRL, lids and conjunctivae normal ENMT: Mucous membranes are dry . Posterior pharynx clear of any exudate or lesions. Neck: normal, supple, no masses, no thyromegaly Respiratory: clear to auscultation bilaterally, no wheezing, no crackles. Normal respiratory effort. No accessory muscle use.  Cardiovascular: Regular rate and rhythm, no murmurs / rubs / gallops. No extremity edema. 2+ pedal pulses. No carotid bruits.  Abdomen: Diffuse abdominal tenderness but abdomen is soft , no masses palpated. No hepatosplenomegaly. Bowel sounds positive.  Musculoskeletal: no clubbing / cyanosis. No joint deformity upper and lower extremities. Good ROM, no contractures. Normal muscle tone.  Skin: no rashes, lesions, ulcers. No induration Neurologic:  On testing peripheral vision patient has deficits in the right upper and lower outer quadrants, present on testing both eyes and each eye individually.  She reports decreased sensation right side of her face, right upper and lower extremities.  Strength 4+/5 in all extremities.  Gait appears normal on walking short distances at bedside. Psychiatric: Normal judgment and insight. Alert and oriented x 3.  Anxious, tearful.  Labs on Admission: I have personally reviewed following labs and imaging studies  CBC: Recent Labs  Lab 01/20/19 1021  WBC 11.3*  HGB 15.5*  HCT 46.4*  MCV 83.3  PLT 588*   Basic Metabolic Panel: Recent Labs  Lab 01/20/19 1021  NA 137  K 2.9*  CL 96*  CO2 25  GLUCOSE 167*  BUN 12  CREATININE 0.74  CALCIUM 9.3   Liver Function Tests: Recent Labs  Lab 01/20/19 1021  AST 18  ALT 17  ALKPHOS 94  BILITOT 0.8  PROT 8.0  ALBUMIN 3.7   Recent Labs  Lab 01/20/19 1021  LIPASE 57*   Urine analysis:    Component Value Date/Time   COLORURINE YELLOW 01/20/2019 1430   APPEARANCEUR CLEAR  01/20/2019 1430   LABSPEC 1.013 01/20/2019 1430   PHURINE 8.0 01/20/2019 1430   GLUCOSEU NEGATIVE 01/20/2019 1430   HGBUR NEGATIVE 01/20/2019 1430   BILIRUBINUR NEGATIVE 01/20/2019 1430   KETONESUR 5 (A) 01/20/2019 1430  PROTEINUR NEGATIVE 01/20/2019 1430   UROBILINOGEN 0.2 02/05/2015 2347   NITRITE NEGATIVE 01/20/2019 1430   LEUKOCYTESUR NEGATIVE 01/20/2019 1430    Radiological Exams on Admission: Ct Chest W Contrast  Result Date: 01/20/2019 CLINICAL DATA:  Nausea and vomiting with pain, initial encounter EXAM: CT CHEST, ABDOMEN, AND PELVIS WITH CONTRAST TECHNIQUE: Multidetector CT imaging of the chest, abdomen and pelvis was performed following the standard protocol during bolus administration of intravenous contrast. CONTRAST:  1102mL ISOVUE-300 IOPAMIDOL (ISOVUE-300) INJECTION 61% COMPARISON:  Plain film from earlier in the same day as well as CT from 02/21/2018 FINDINGS: CT CHEST FINDINGS Cardiovascular: Thoracic aorta is within normal limits. No cardiac enlargement is seen. The pulmonary artery as visualized is within normal limits. 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 Opacification of the more distal subclavian artery is noted secondary to a left carotid to subclavian bypass. Mediastinum/Nodes: Thoracic inlet is within normal limits.The esophagus demonstrates some diffuse wall thickening likely related to underlying reflux. The overall appearance is stable from the prior exam. No left hilar adenopathy is noted. No significant mediastinal adenopathy is noted. Scattered small right hilar lymph nodes are seen likely reactive in nature. Lungs/Pleura: Lungs are well aerated bilaterally. A few scattered calcified granulomas are seen. In the superior segment of the right lower lobe, there is a focal 3.8 x 4.3 cm mass lesion with central decreased attenuation identified consistent with fluid. Some suggestion of air within the central  portion of the lesion is noted on the coronal and sagittal reconstructions . The lungs are otherwise clear. No sizable effusion is noted. Musculoskeletal: No acute bony abnormality is noted. CT ABDOMEN PELVIS FINDINGS Hepatobiliary: Fatty infiltration of the liver is noted. The gallbladder has been surgically removed. Pancreas: Unremarkable. No pancreatic ductal dilatation or surrounding inflammatory changes. Spleen: Normal in size without focal abnormality. Adrenals/Urinary Tract: Adrenal glands are within normal limits. Kidneys are well visualized bilaterally without renal calculi or obstructive changes. Ureters are within normal limits. The bladder is partially distended. Stomach/Bowel: The appendix is within normal limits. No obstructive or inflammatory changes of the bowel are seen. Small sliding-type hiatal hernia is noted as well as the aforementioned esophageal thickening distally consistent with reflux disease. Vascular/Lymphatic: Aortic atherosclerosis. No enlarged abdominal or pelvic lymph nodes. Reproductive: Uterus and bilateral adnexa are unremarkable. Other: No abdominal wall hernia or abnormality. No abdominopelvic ascites. Musculoskeletal: Degenerative changes of the lumbar spine are noted. IMPRESSION: Rounded masslike density in the superior segment of the right lower lobe as described. There is central decreased attenuation as well as a few foci of what appears to be air within on the sagittal and coronal reconstructions. Although this could represent a necrotic neoplasm, short-term treatment for necrotic pneumonia/lung abscess is recommended with repeat imaging to assess for resolution. If the lesion persists on subsequent imaging more aggressive workup can be performed. Chronic changes as described above. Electronically Signed   By: Inez Catalina M.D.   On: 01/20/2019 15:56   Ct Abdomen Pelvis W Contrast  Result Date: 01/20/2019 CLINICAL DATA:  Nausea and vomiting with pain, initial encounter  EXAM: CT CHEST, ABDOMEN, AND PELVIS WITH CONTRAST TECHNIQUE: Multidetector CT imaging of the chest, abdomen and pelvis was performed following the standard protocol during bolus administration of intravenous contrast. CONTRAST:  187mL ISOVUE-300 IOPAMIDOL (ISOVUE-300) INJECTION 61% COMPARISON:  Plain film from earlier in the same day as well as CT from 02/21/2018 FINDINGS: CT CHEST FINDINGS Cardiovascular: Thoracic aorta is  within normal limits. No cardiac enlargement is seen. The pulmonary artery as visualized is within normal limits. 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 Opacification of the more distal subclavian artery is noted secondary to a left carotid to subclavian bypass. Mediastinum/Nodes: Thoracic inlet is within normal limits.The esophagus demonstrates some diffuse wall thickening likely related to underlying reflux. The overall appearance is stable from the prior exam. No left hilar adenopathy is noted. No significant mediastinal adenopathy is noted. Scattered small right hilar lymph nodes are seen likely reactive in nature. Lungs/Pleura: Lungs are well aerated bilaterally. A few scattered calcified granulomas are seen. In the superior segment of the right lower lobe, there is a focal 3.8 x 4.3 cm mass lesion with central decreased attenuation identified consistent with fluid. Some suggestion of air within the central portion of the lesion is noted on the coronal and sagittal reconstructions . The lungs are otherwise clear. No sizable effusion is noted. Musculoskeletal: No acute bony abnormality is noted. CT ABDOMEN PELVIS FINDINGS Hepatobiliary: Fatty infiltration of the liver is noted. The gallbladder has been surgically removed. Pancreas: Unremarkable. No pancreatic ductal dilatation or surrounding inflammatory changes. Spleen: Normal in size without focal abnormality. Adrenals/Urinary Tract: Adrenal glands are within normal limits.  Kidneys are well visualized bilaterally without renal calculi or obstructive changes. Ureters are within normal limits. The bladder is partially distended. Stomach/Bowel: The appendix is within normal limits. No obstructive or inflammatory changes of the bowel are seen. Small sliding-type hiatal hernia is noted as well as the aforementioned esophageal thickening distally consistent with reflux disease. Vascular/Lymphatic: Aortic atherosclerosis. No enlarged abdominal or pelvic lymph nodes. Reproductive: Uterus and bilateral adnexa are unremarkable. Other: No abdominal wall hernia or abnormality. No abdominopelvic ascites. Musculoskeletal: Degenerative changes of the lumbar spine are noted. IMPRESSION: Rounded masslike density in the superior segment of the right lower lobe as described. There is central decreased attenuation as well as a few foci of what appears to be air within on the sagittal and coronal reconstructions. Although this could represent a necrotic neoplasm, short-term treatment for necrotic pneumonia/lung abscess is recommended with repeat imaging to assess for resolution. If the lesion persists on subsequent imaging more aggressive workup can be performed. Chronic changes as described above. Electronically Signed   By: Inez Catalina M.D.   On: 01/20/2019 15:56   Dg Abd Acute 2+v W 1v Chest  Result Date: 01/20/2019 CLINICAL DATA:  Abdominal pain, nausea, vomiting, severe watery diarrhea, fevers and decreased urinary output. Smoker. EXAM: DG ABDOMEN ACUTE W/ 1V CHEST COMPARISON:  Portable chest dated 03/19/2018, chest CTA dated 02/21/2018 and abdomen and pelvis CT dated 07/19/2017. FINDINGS: Normal sized heart. Interval 4.2 cm rounded mass-like density with poorly defined margins in the right mid lung on the frontal view of the chest. Moderate diffuse peribronchial thickening and accentuation of the interstitial markings with mild progression. Normal bowel gas pattern without free peritoneal air.  Cholecystectomy clips. Minimal lumbar spine degenerative changes. IMPRESSION: 1. Interval 4.2 cm rounded mass-like density in the right mid lung. This is concerning for a primary lung neoplasm. A rounded area of pneumonia is less likely. Further evaluation with a chest CT with contrast is recommended. 2. Progressive bronchitic changes. 3. No acute abdominal abnormality. Electronically Signed   By: Claudie Revering M.D.   On: 01/20/2019 13:18    EKG: None.  Assessment/Plan Active Problems:   Gastroenteritis  Gastroenteritis-multiple episodes of vomiting and loose stools for 2 weeks.  Afebrile.  WBC 11.3. - Stool C. Diff - GI pathogen panel - Hydrate N/s+ 40 KCL 125cc/hr x 15hrs - IV famotidine -Bowel rest with Clear liquid diet  Lung Mass-incidental finding, patient is asymptomatic. CT W contrast- Necrotic neoplasm Vs necrotic pneumonia Vs lung abscess.  Short-term treatment recommended with repeat imaging to assess for resolution.  Tobacco abuse 1/2 PPD.  She denies family history of lung cancer. -IV doxycycline and IV Unasyn -Pulmonology consult-order placed  Left CVA- Peripheral visual defects, right upper and outer quadrants both eyes.  Gait abnormalities, sensory deficits right side and headaches.  Stat CT head- Acute/subacute infarction in the left parieto-occipital junction region. Minimal petechial blood products but no frank hematoma. Mild swelling but no significant mass effect or shift. - Talked to neurology on call at [facility], Dr Aroor, recommended CTA, and if greater than 70% stenosis patient would need carotid endarterectomy and therefore transfer to [facility]. But patient was exposed to contrast today so cannot do CTA. He recommended CTA does not need to be done emergently, MRA can also be done. So admit at [facility] for now. Also avoid dual antiplatelet, Can give aspirin. -Echocardiogram -PT OT -MRI brain -MRA head and neck -Lipid panel, Hgba1c a.m -Aspirin 325mg  X 1, then 81 daily -  Atorvastatin 80mg  daily -Neurology consulted- Order placed  Hypokalemia- potassium 2.9.  Likely from GI losses.  Normal magnesium 1.9. - Replete K - BMP a.m  HIV as part of routine health screening  DVT prophylaxis: Scds Code Status: Full Family Communication: Significant other at bedside Disposition Plan: 1-2 days Consults called: Pulmonology, neurology Admission status: Obs, tele   Bethena Roys MD [facility]  01/20/2019, 4:51 PM

## 2019-01-20 NOTE — ED Notes (Signed)
ED Provider at bedside. 

## 2019-01-20 NOTE — Progress Notes (Signed)
Pharmacy Antibiotic Note  Rita [name] is a 53 y.o. female admitted on 01/20/2019 with pneumonia.  Pharmacy has been consulted for unasyn dosing.  Plan: unasyn 3gm iv q6h  Height: 5\' 7"  (170.2 cm) Weight: 185 lb 13.6 oz (84.3 kg) IBW/kg (Calculated) : 61.6  Temp (24hrs), Avg:97.8 F (36.6 C), Min:97.8 F (36.6 C), Max:97.8 F (36.6 C)  Recent Labs  Lab 01/20/19 1021  WBC 11.3*  CREATININE 0.74    Estimated Creatinine Clearance: 90.8 mL/min (by C-G formula based on SCr of 0.74 mg/dL).    No Known Allergies  Antimicrobials this admission: 1/26 unasyn >>  1/26 doxycycline >>   Microbiology results: 1/26 GI Panel: Sent  Thank you for allowing pharmacy to be a part of this patient's care.  Rita [name] 01/20/2019 7:26 PM

## 2019-01-20 NOTE — ED Notes (Signed)
Report given to [facility] at this time. IV Doxycycline bag from [facility] transported with pt upstairs.

## 2019-01-21 ENCOUNTER — Observation Stay (HOSPITAL_COMMUNITY): Payer: Medicare Other

## 2019-01-21 ENCOUNTER — Inpatient Hospital Stay (HOSPITAL_COMMUNITY): Payer: Medicare Other

## 2019-01-21 DIAGNOSIS — G8929 Other chronic pain: Secondary | ICD-10-CM | POA: Diagnosis present

## 2019-01-21 DIAGNOSIS — J440 Chronic obstructive pulmonary disease with acute lower respiratory infection: Secondary | ICD-10-CM | POA: Diagnosis not present

## 2019-01-21 DIAGNOSIS — R29703 NIHSS score 3: Secondary | ICD-10-CM | POA: Diagnosis present

## 2019-01-21 DIAGNOSIS — R918 Other nonspecific abnormal finding of lung field: Secondary | ICD-10-CM | POA: Diagnosis not present

## 2019-01-21 DIAGNOSIS — K219 Gastro-esophageal reflux disease without esophagitis: Secondary | ICD-10-CM | POA: Diagnosis not present

## 2019-01-21 DIAGNOSIS — I6389 Other cerebral infarction: Secondary | ICD-10-CM | POA: Diagnosis not present

## 2019-01-21 DIAGNOSIS — J449 Chronic obstructive pulmonary disease, unspecified: Secondary | ICD-10-CM | POA: Diagnosis not present

## 2019-01-21 DIAGNOSIS — K529 Noninfective gastroenteritis and colitis, unspecified: Secondary | ICD-10-CM | POA: Diagnosis not present

## 2019-01-21 DIAGNOSIS — I081 Rheumatic disorders of both mitral and tricuspid valves: Secondary | ICD-10-CM | POA: Diagnosis not present

## 2019-01-21 DIAGNOSIS — H53461 Homonymous bilateral field defects, right side: Secondary | ICD-10-CM | POA: Diagnosis present

## 2019-01-21 DIAGNOSIS — Z79899 Other long term (current) drug therapy: Secondary | ICD-10-CM | POA: Diagnosis not present

## 2019-01-21 DIAGNOSIS — I340 Nonrheumatic mitral (valve) insufficiency: Secondary | ICD-10-CM | POA: Diagnosis not present

## 2019-01-21 DIAGNOSIS — H5461 Unqualified visual loss, right eye, normal vision left eye: Secondary | ICD-10-CM | POA: Diagnosis present

## 2019-01-21 DIAGNOSIS — J984 Other disorders of lung: Secondary | ICD-10-CM | POA: Diagnosis not present

## 2019-01-21 DIAGNOSIS — E7800 Pure hypercholesterolemia, unspecified: Secondary | ICD-10-CM | POA: Diagnosis not present

## 2019-01-21 DIAGNOSIS — I63212 Cerebral infarction due to unspecified occlusion or stenosis of left vertebral arteries: Secondary | ICD-10-CM | POA: Diagnosis not present

## 2019-01-21 DIAGNOSIS — E876 Hypokalemia: Secondary | ICD-10-CM | POA: Diagnosis not present

## 2019-01-21 DIAGNOSIS — Z720 Tobacco use: Secondary | ICD-10-CM | POA: Diagnosis not present

## 2019-01-21 DIAGNOSIS — I10 Essential (primary) hypertension: Secondary | ICD-10-CM | POA: Diagnosis not present

## 2019-01-21 DIAGNOSIS — I361 Nonrheumatic tricuspid (valve) insufficiency: Secondary | ICD-10-CM

## 2019-01-21 DIAGNOSIS — G464 Cerebellar stroke syndrome: Secondary | ICD-10-CM | POA: Diagnosis not present

## 2019-01-21 DIAGNOSIS — I63419 Cerebral infarction due to embolism of unspecified middle cerebral artery: Secondary | ICD-10-CM | POA: Diagnosis not present

## 2019-01-21 DIAGNOSIS — J852 Abscess of lung without pneumonia: Secondary | ICD-10-CM | POA: Diagnosis not present

## 2019-01-21 DIAGNOSIS — K210 Gastro-esophageal reflux disease with esophagitis: Secondary | ICD-10-CM | POA: Diagnosis not present

## 2019-01-21 DIAGNOSIS — Z7982 Long term (current) use of aspirin: Secondary | ICD-10-CM | POA: Diagnosis not present

## 2019-01-21 DIAGNOSIS — M545 Low back pain: Secondary | ICD-10-CM | POA: Diagnosis present

## 2019-01-21 DIAGNOSIS — F329 Major depressive disorder, single episode, unspecified: Secondary | ICD-10-CM | POA: Diagnosis not present

## 2019-01-21 DIAGNOSIS — I472 Ventricular tachycardia: Secondary | ICD-10-CM | POA: Diagnosis not present

## 2019-01-21 DIAGNOSIS — I639 Cerebral infarction, unspecified: Secondary | ICD-10-CM | POA: Diagnosis not present

## 2019-01-21 DIAGNOSIS — Z823 Family history of stroke: Secondary | ICD-10-CM | POA: Diagnosis not present

## 2019-01-21 DIAGNOSIS — I071 Rheumatic tricuspid insufficiency: Secondary | ICD-10-CM | POA: Diagnosis present

## 2019-01-21 DIAGNOSIS — E440 Moderate protein-calorie malnutrition: Secondary | ICD-10-CM | POA: Diagnosis not present

## 2019-01-21 DIAGNOSIS — D649 Anemia, unspecified: Secondary | ICD-10-CM | POA: Diagnosis not present

## 2019-01-21 DIAGNOSIS — F17210 Nicotine dependence, cigarettes, uncomplicated: Secondary | ICD-10-CM | POA: Diagnosis not present

## 2019-01-21 DIAGNOSIS — I708 Atherosclerosis of other arteries: Secondary | ICD-10-CM | POA: Diagnosis not present

## 2019-01-21 DIAGNOSIS — E785 Hyperlipidemia, unspecified: Secondary | ICD-10-CM | POA: Diagnosis present

## 2019-01-21 DIAGNOSIS — I63513 Cerebral infarction due to unspecified occlusion or stenosis of bilateral middle cerebral arteries: Secondary | ICD-10-CM | POA: Diagnosis not present

## 2019-01-21 DIAGNOSIS — I739 Peripheral vascular disease, unspecified: Secondary | ICD-10-CM | POA: Diagnosis present

## 2019-01-21 DIAGNOSIS — R600 Localized edema: Secondary | ICD-10-CM | POA: Diagnosis not present

## 2019-01-21 LAB — BASIC METABOLIC PANEL
Anion gap: 7 (ref 5–15)
BUN: 8 mg/dL (ref 6–20)
CO2: 26 mmol/L (ref 22–32)
Calcium: 8.2 mg/dL — ABNORMAL LOW (ref 8.9–10.3)
Chloride: 107 mmol/L (ref 98–111)
Creatinine, Ser: 0.64 mg/dL (ref 0.44–1.00)
GFR calc non Af Amer: >60 mL/min (ref >60)
Glucose, Bld: 97 mg/dL (ref 70–99)
Potassium: 3 mmol/L — ABNORMAL LOW (ref 3.5–5.1)
Sodium: 140 mmol/L (ref 135–145)

## 2019-01-21 LAB — ECHOCARDIOGRAM COMPLETE
HEIGHTINCHES: 66 in
Weight: 2772.5 oz

## 2019-01-21 LAB — HEMOGLOBIN A1C
Hgb A1c MFr Bld: 5.7 % — ABNORMAL HIGH (ref 4.8–5.6)
Mean Plasma Glucose: 116.89 mg/dL

## 2019-01-21 LAB — LIPID PANEL
Cholesterol: 134 mg/dL (ref 0–200)
HDL: 30 mg/dL — ABNORMAL LOW (ref >40)
LDL Cholesterol: 83 mg/dL (ref 0–99)
Total CHOL/HDL Ratio: 4.5 RATIO
Triglycerides: 106 mg/dL (ref <150)
VLDL: 21 mg/dL (ref 0–40)

## 2019-01-21 MED ORDER — OXYCODONE HCL 5 MG PO TABS
5.0000 mg | ORAL_TABLET | ORAL | Status: DC | PRN
  Administered 2019-01-21 – 2019-01-23 (×8): 5 mg via ORAL
  Filled 2019-01-21 (×9): qty 1

## 2019-01-21 MED ORDER — HEPARIN SODIUM (PORCINE) 5000 UNIT/ML IJ SOLN
5000.0000 [IU] | Freq: Three times a day (TID) | INTRAMUSCULAR | Status: DC
  Administered 2019-01-21 – 2019-01-24 (×9): 5000 [IU] via SUBCUTANEOUS
  Filled 2019-01-21 (×9): qty 1

## 2019-01-21 MED ORDER — POTASSIUM CHLORIDE CRYS ER 20 MEQ PO TBCR
40.0000 meq | EXTENDED_RELEASE_TABLET | Freq: Once | ORAL | Status: AC
  Administered 2019-01-21: 40 meq via ORAL
  Filled 2019-01-21: qty 2

## 2019-01-21 MED ORDER — PHENOL 1.4 % MT LIQD
1.0000 | OROMUCOSAL | Status: DC | PRN
  Administered 2019-01-21: 1 via OROMUCOSAL
  Filled 2019-01-21: qty 177

## 2019-01-21 MED ORDER — POTASSIUM CHLORIDE IN NACL 40-0.9 MEQ/L-% IV SOLN: INTRAVENOUS | Status: AC

## 2019-01-21 MED ORDER — IPRATROPIUM-ALBUTEROL 0.5-2.5 (3) MG/3ML IN SOLN
3.0000 mL | Freq: Three times a day (TID) | RESPIRATORY_TRACT | Status: DC
  Administered 2019-01-21 (×2): 3 mL via RESPIRATORY_TRACT
  Filled 2019-01-21 (×3): qty 3

## 2019-01-21 MED ORDER — GUAIFENESIN ER 600 MG PO TB12
600.0000 mg | ORAL_TABLET | Freq: Two times a day (BID) | ORAL | Status: DC
  Administered 2019-01-21 – 2019-01-24 (×6): 600 mg via ORAL
  Filled 2019-01-21 (×6): qty 1

## 2019-01-21 MED ORDER — ALBUTEROL SULFATE (2.5 MG/3ML) 0.083% IN NEBU: 2.5000 mg | INHALATION_SOLUTION | RESPIRATORY_TRACT | Status: DC | PRN

## 2019-01-21 MED ORDER — PRO-STAT SUGAR FREE PO LIQD
30.0000 mL | Freq: Two times a day (BID) | ORAL | Status: DC
  Administered 2019-01-21 – 2019-01-24 (×6): 30 mL via ORAL
  Filled 2019-01-21 (×6): qty 30

## 2019-01-21 MED ORDER — ADULT MULTIVITAMIN W/MINERALS CH
1.0000 | ORAL_TABLET | Freq: Every day | ORAL | Status: DC
  Administered 2019-01-21 – 2019-01-24 (×4): 1 via ORAL
  Filled 2019-01-21 (×4): qty 1

## 2019-01-21 MED ORDER — GADOBUTROL 1 MMOL/ML IV SOLN
7.0000 mL | Freq: Once | INTRAVENOUS | Status: AC | PRN
  Administered 2019-01-21: 7 mL via INTRAVENOUS

## 2019-01-21 NOTE — Plan of Care (Signed)
IV lost in transport from procedure.  x3 attempts by ICU RN - with no success.  Dr. Informed and IV team consult placed to assess for Triple Lumen PICC placement.  Oral pain meds ordered, since IV pain meds not able to give.  IV team contacted to state hey would not be able to assess patient till tomorrow (if still here). They also stated, " if already transferred to [facility] - we will make sure to see her there."

## 2019-01-21 NOTE — Progress Notes (Signed)
Patient Demographics:    Rita [name], is a 53 y.o. female, DOB - 01/23/1966, OYD:[id number]  Admit date - 01/20/2019   Admitting Physician Ejiroghene Arlyce Dice, MD  Outpatient Primary MD for the patient is System, Pcp Not In  LOS - 0   Chief Complaint  Patient presents with  . Abdominal Pain        Subjective:    Rita [name] today has no fevers, no emesis, had chest pain earlier in the day, resolved now, no further diarrhea, abdominal pain on and off, cough and congestion with discolored sputum,   Assessment  & Plan :    Principal Problem:   Left sided cerebral hemisphere cerebrovascular accident (CVA) ([place]) Active Problems:   Tobacco use   Left subclavian artery occlusion   Gastroenteritis   Cavitating mass in right lower lung lobe (PNA Vs Cancer)  MRI/MRA Head and MRA Neck IMPRESSION: 1. Acute/subacute nonhemorrhagic left occipital lobe infarct. This may be a lateral PCA or watershed infarct. 2. Additional acute/subacute nonhemorrhagic left ACA/MCA watershed infarcts over the left convexity. 3. Subcortical white matter disease is mildly advanced for age. This likely reflects the sequela of chronic microvascular ischemia. 4. 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 5. Contrast in the left vertebral artery proximal to the dural segment. Limited time-of-flight signal suggests slow or reversed flow. 6. Moderate distal right M1 segment stenosis. 7. Moderate distal left A1 segment stenosis. 8. Diffuse small vessel disease present on the MRA circle-of-Willis. 9. Mild irregularity in the basilar artery and proximal PCA vessels bilaterally with asymmetric attenuation of distal branches, right greater than left.  Brief Summary Patient is a 53 year old smoker with history of PAD, Barrett's esophagus, chronic abdominal pain, chronic lower back pain, chronic pancreatitis, COPD admitted on  01/20/2019 with GI symptoms, however patient also complained of visual disturbance and gait disturbance so admitting physician did a neuro work-up which showed left hemispheric strokes, pt also found to have right lower lobe necrotic neoplasm Versus necrotic pneumonia/lung abscess... After discussing with neurologist Dr. Julius Bowels transfer to [place] for TEE to rule out endocarditis with septic emboli, as well as for possible lung biopsy   Plan:- 1)Left Hemispheric strokes--- watershed pattern involving left occipital lobe, left ACA/MCA watershed infarcts, as well as occlusion of left vertebral artery--- as per Dr. Erlinda Hong okay to continue aspirin hold off on Plavix in case patient needs additional procedures, continue Lipitor, TTE with preserved EF of 60 to 65% without wall motion abnormalities or intracardiac thrombus or endocarditis type findings, patient needs a TEE, patient will also need hypercoagulability work-up if TEE is negative.... Lower extremity venous Dopplers without acute DVT.  Patient may need vascular surgery consult... HDL is 30, LDL is 83, A1c is 5.7, TSH was 3.7 in January 2019  2)Rt LL PNA Vs Cancer--- CT chest with finding of  right lower lobe necrotic neoplasm Versus necrotic pneumonia/lung abscess, pulmonology consult from Dr. Luan Pulling appreciated, continue IV Unasyn, patient may need lung biopsy, blood cultures are pending  3)Gastroenteritis--- diarrhea appears to have resolved for now, stool for C. difficile and GI panel not sent as patient has no further loose stools  4)Tobacco abuse--- smoking cessation strongly advised  Disposition/Need for in-Hospital Stay- patient unable to be discharged  at this time due to acute stroke and right-sided pneumonia requiring further stroke work-up including TEE and IV antibiotics for pneumonia  Code Status : Full  Family Communication:   na   Disposition Plan  : Transfer to [facility] complex as outlined above for possible TEE,  possible lung biopsy and for  stroke neurology consult, patient may need vascular surgery consult  Consults  :  Phone Consult with Dr Erlinda Hong--- Neurology  DVT Prophylaxis  :  Heparin - SCDs   Lab Results  Component Value Date   PLT 570 (H) 01/20/2019    Inpatient Medications  Scheduled Meds: . aspirin EC  81 mg Oral Daily  . atorvastatin  80 mg Oral q1800  . feeding supplement  1 Container Oral TID BM  . feeding supplement (PRO-STAT SUGAR FREE 64)  30 mL Oral BID  . multivitamin with minerals  1 tablet Oral Daily  . potassium chloride  40 mEq Oral Once  . sodium chloride flush  3 mL Intravenous Once   Continuous Infusions: . 0.9 % NaCl with KCl 40 mEq / L    . ampicillin-sulbactam (UNASYN) IV 3 g (01/21/19 0931)  . famotidine Stopped (01/21/19 0041)   PRN Meds:.HYDROmorphone (DILAUDID) injection, ondansetron **OR** ondansetron (ZOFRAN) IV, oxyCODONE    Anti-infectives (From admission, onward)   Start     Dose/Rate Route Frequency Ordered Stop   01/20/19 1930  doxycycline (VIBRAMYCIN) 100 mg in sodium chloride 0.9 % 250 mL IVPB  Status:  Discontinued     100 mg 125 mL/hr over 120 Minutes Intravenous Every 12 hours 01/20/19 1915 01/21/19 1232   01/20/19 1930  Ampicillin-Sulbactam (UNASYN) 3 g in sodium chloride 0.9 % 100 mL IVPB     3 g 200 mL/hr over 30 Minutes Intravenous Every 6 hours 01/20/19 1925          Objective:   Vitals:   01/20/19 2012 01/20/19 2013 01/21/19 0533 01/21/19 1315  BP:  121/78 107/68 120/74  Pulse:  75 63 62  Resp:  12 19 18   Temp:  98.4 F (36.9 C) 98 F (36.7 C) 98.4 F (36.9 C)  TempSrc:  Oral Oral   SpO2:   100% 98%  Weight: 78.6 kg 78.6 kg    Height: 5\' 6"  (1.676 m) 5\' 6"  (1.676 m)      Wt Readings from Last 3 Encounters:  01/20/19 78.6 kg  03/19/18 84.3 kg  03/14/18 84.3 kg     Intake/Output Summary (Last 24 hours) at 01/21/2019 1706 Last data filed at 01/21/2019 0116 Gross per 24 hour  Intake 923.3 ml  Output -  Net 923.3  ml   Physical Exam Patient is examined daily including today on 01/21/2019 , exams remain the same as of yesterday except that has changed   Gen:- Awake Alert, no acute distress HEENT:- [place].AT, No sclera icterus Eyes--visual deficiencies in the right upper and to a lesser extent lower outer quadrants Neck-Supple Neck,No JVD,.  Lungs-fair air movement, few scattered rhonchi right lung  CV- S1, S2 normal, regular 2/6 SM Abd-  +ve B.Sounds, Abd Soft, No tenderness,    Extremity/Skin:- No  edema, pedal pulses present  Psych-affect is appropriate, oriented x3 Neuro-muscle strength 5/5 on the right,  subtle right-sided sensory deficits as well as subtle right-sided extremities sensory deficit   Data Review:   Micro Results Recent Results (from the past 240 hour(s))  Culture, blood (Routine X 2) w Reflex to ID Panel     Status: None (  Preliminary result)   Collection Time: 01/21/19  3:26 PM  Result Value Ref Range Status   Specimen Description RIGHT ANTECUBITAL  Final   Special Requests   Final    BOTTLES DRAWN AEROBIC AND ANAEROBIC Blood Culture adequate volume Performed at [facility], [address], [place], [place] [address]    Culture PENDING  Incomplete   Report Status PENDING  Incomplete    Radiology Reports Ct Head Wo Contrast  Result Date: 01/20/2019 CLINICAL DATA:  Right-sided visual disturbance. EXAM: CT HEAD WITHOUT CONTRAST TECHNIQUE: Contiguous axial images were obtained from the base of the skull through the vertex without intravenous contrast. COMPARISON:  11/29/2009 FINDINGS: Brain: There is acute/subacute infarction in the left parieto-occipital junction region. There may be minimal petechial blood products but there is no frank hematoma. Mild swelling but no significant mass effect or shift. The remainder the brain appears negative. Vascular: No significant vascular finding. Ordinary atherosclerotic calcification in the carotid siphon regions. No finding on this  noncontrast study to suggest venous thrombosis. Skull: Normal Sinuses/Orbits: Clear/normal Other: None IMPRESSION: Acute/subacute infarction in the left parieto-occipital junction region. Minimal petechial blood products but no frank hematoma. Mild swelling but no significant mass effect or shift. Electronically Signed   By: Nelson Chimes M.D.   On: 01/20/2019 19:15   Ct Chest W Contrast  Result Date: 01/20/2019 CLINICAL DATA:  Nausea and vomiting with pain, initial encounter EXAM: CT CHEST, ABDOMEN, AND PELVIS WITH CONTRAST TECHNIQUE: Multidetector CT imaging of the chest, abdomen and pelvis was performed following the standard protocol during bolus administration of intravenous contrast. CONTRAST:  19mL ISOVUE-300 IOPAMIDOL (ISOVUE-300) INJECTION 61% COMPARISON:  Plain film from earlier in the same day as well as CT from 02/21/2018 FINDINGS: CT CHEST FINDINGS Cardiovascular: Thoracic aorta is within normal limits. No cardiac enlargement is seen. The pulmonary artery as visualized is within normal limits. 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 Opacification of the more distal subclavian artery is noted secondary to a left carotid to subclavian bypass. Mediastinum/Nodes: Thoracic inlet is within normal limits.The esophagus demonstrates some diffuse wall thickening likely related to underlying reflux. The overall appearance is stable from the prior exam. No left hilar adenopathy is noted. No significant mediastinal adenopathy is noted. Scattered small right hilar lymph nodes are seen likely reactive in nature. Lungs/Pleura: Lungs are well aerated bilaterally. A few scattered calcified granulomas are seen. In the superior segment of the right lower lobe, there is a focal 3.8 x 4.3 cm mass lesion with central decreased attenuation identified consistent with fluid. Some suggestion of air within the central portion of the lesion is noted on the coronal  and sagittal reconstructions . The lungs are otherwise clear. No sizable effusion is noted. Musculoskeletal: No acute bony abnormality is noted. CT ABDOMEN PELVIS FINDINGS Hepatobiliary: Fatty infiltration of the liver is noted. The gallbladder has been surgically removed. Pancreas: Unremarkable. No pancreatic ductal dilatation or surrounding inflammatory changes. Spleen: Normal in size without focal abnormality. Adrenals/Urinary Tract: Adrenal glands are within normal limits. Kidneys are well visualized bilaterally without renal calculi or obstructive changes. Ureters are within normal limits. The bladder is partially distended. Stomach/Bowel: The appendix is within normal limits. No obstructive or inflammatory changes of the bowel are seen. Small sliding-type hiatal hernia is noted as well as the aforementioned esophageal thickening distally consistent with reflux disease. Vascular/Lymphatic: Aortic atherosclerosis. No enlarged abdominal or pelvic lymph nodes. Reproductive: Uterus and bilateral adnexa are unremarkable.  Other: No abdominal wall hernia or abnormality. No abdominopelvic ascites. Musculoskeletal: Degenerative changes of the lumbar spine are noted. IMPRESSION: Rounded masslike density in the superior segment of the right lower lobe as described. There is central decreased attenuation as well as a few foci of what appears to be air within on the sagittal and coronal reconstructions. Although this could represent a necrotic neoplasm, short-term treatment for necrotic pneumonia/lung abscess is recommended with repeat imaging to assess for resolution. If the lesion persists on subsequent imaging more aggressive workup can be performed. Chronic changes as described above. Electronically Signed   By: Inez Catalina M.D.   On: 01/20/2019 15:56   Mr Jodene Nam Head Wo Contrast  Result Date: 01/21/2019 CLINICAL DATA:  Headache and weakness. Abnormal CT scan. Visual field deficits for 1 week. Gait abnormalities.  Headaches. EXAM: MRI HEAD WITHOUT CONTRAST MRA HEAD WITHOUT CONTRAST MRA NECK WITHOUT AND WITH CONTRAST TECHNIQUE: Multiplanar, multiecho pulse sequences of the brain and surrounding structures were obtained without contrast. Angiographic images of the Circle of Willis were obtained using MRA technique without intravenous contrast. Angiographic images of the neck were obtained using MRA technique without and with intravenous contrast. Carotid stenosis measurements (when applicable) are obtained utilizing NASCET criteria, using the distal internal carotid diameter as the denominator. CONTRAST:  7 mL Gadavist COMPARISON:  CT head without contrast 01/20/2019 FINDINGS: MRI HEAD FINDINGS Brain: A left MCA/PCA acute/subacute watershed infarct is confirmed. Additional foci of restricted diffusion extend over the convexity in the left MCA/ACA watershed territory. There is minimal, if any, involvement of the precentral gyrus. T2 signal changes are associated with the areas of acute/subacute infarction, compatible with a time frame over the last week. Minimal subcortical T2 changes are present in the right frontal lobe and more anterior left frontal lobe. Basal ganglia are intact. Insert normal IAC's The brainstem and cerebellum are within normal limits. Vascular: Flow is present in the major intracranial arteries. Skull and upper cervical spine: The craniocervical junction is normal. Upper cervical spine is within normal limits. Marrow signal is unremarkable. Sinuses/Orbits: Mild mucosal thickening is present within the ethmoid air cells. The paranasal sinuses and mastoid air cells are otherwise clear. MRA HEAD FINDINGS Atherosclerotic irregularity is present within the cavernous internal carotid arteries bilaterally. There is a moderate stenosis in the distal left A1 segment. The right A1 segments is normal. The left M1 segment is normal. There is a moderate stenosis of the distal right M1 segment with significant signal  loss. Distal small vessel disease is present in the MCA distributions bilaterally. No other significant proximal disease is present. There is no flow in the vertebral artery apart from minimal signal is proximal to the vertebrobasilar junction. There is a moderate stenosis of the residual right vertebral artery at the PICA. Irregularity is present throughout the basilar artery without a significant stenosis. There is mild irregularity of the proximal PCA vessels bilaterally without significant stenosis. Right greater than left distal PCA branch vessel attenuation is present. MRA NECK FINDINGS The time-of-flight images demonstrate no significant flow disturbance at either carotid bifurcation. There is no antegrade flow signal in the left vertebral artery. The right common carotid artery is within normal limits. There is moderate stenosis at the right external carotid artery. No significant stenosis is present in the right ICA through the ICA terminus. The left common carotid artery is within normal limits. Bifurcation is unremarkable. There is no significant stenosis in the left ICA through the terminus. There is moderate narrowing at the  origin of the dominant right vertebral artery. Additional narrowing is present at the dural margin of the right vertebral artery. Distal left vertebral artery occlusion is present. There is some signal in the left vertebral artery to the level of the dura. IMPRESSION: 1. Acute/subacute nonhemorrhagic left occipital lobe infarct. This may be a lateral PCA or watershed infarct. 2. Additional acute/subacute nonhemorrhagic left ACA/MCA watershed infarcts over the left convexity. 3. Subcortical white matter disease is mildly advanced for age. This likely reflects the sequela of chronic microvascular ischemia. 4. 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 5. Contrast in the left vertebral artery proximal to the dural segment. Limited time-of-flight signal suggests slow or reversed  flow. 6. Moderate distal right M1 segment stenosis. 7. Moderate distal left A1 segment stenosis. 8. Diffuse small vessel disease present on the MRA circle-of-Willis. 9. Mild irregularity in the basilar artery and proximal PCA vessels bilaterally with asymmetric attenuation of distal branches, right greater than left. Electronically Signed   By: San Morelle M.D.   On: 01/21/2019 09:07   Mr Jodene Nam Neck W Wo Contrast  Result Date: 01/21/2019 CLINICAL DATA:  Headache and weakness. Abnormal CT scan. Visual field deficits for 1 week. Gait abnormalities. Headaches. EXAM: MRI HEAD WITHOUT CONTRAST MRA HEAD WITHOUT CONTRAST MRA NECK WITHOUT AND WITH CONTRAST TECHNIQUE: Multiplanar, multiecho pulse sequences of the brain and surrounding structures were obtained without contrast. Angiographic images of the Circle of Willis were obtained using MRA technique without intravenous contrast. Angiographic images of the neck were obtained using MRA technique without and with intravenous contrast. Carotid stenosis measurements (when applicable) are obtained utilizing NASCET criteria, using the distal internal carotid diameter as the denominator. CONTRAST:  7 mL Gadavist COMPARISON:  CT head without contrast 01/20/2019 FINDINGS: MRI HEAD FINDINGS Brain: A left MCA/PCA acute/subacute watershed infarct is confirmed. Additional foci of restricted diffusion extend over the convexity in the left MCA/ACA watershed territory. There is minimal, if any, involvement of the precentral gyrus. T2 signal changes are associated with the areas of acute/subacute infarction, compatible with a time frame over the last week. Minimal subcortical T2 changes are present in the right frontal lobe and more anterior left frontal lobe. Basal ganglia are intact. Insert normal IAC's The brainstem and cerebellum are within normal limits. Vascular: Flow is present in the major intracranial arteries. Skull and upper cervical spine: The craniocervical  junction is normal. Upper cervical spine is within normal limits. Marrow signal is unremarkable. Sinuses/Orbits: Mild mucosal thickening is present within the ethmoid air cells. The paranasal sinuses and mastoid air cells are otherwise clear. MRA HEAD FINDINGS Atherosclerotic irregularity is present within the cavernous internal carotid arteries bilaterally. There is a moderate stenosis in the distal left A1 segment. The right A1 segments is normal. The left M1 segment is normal. There is a moderate stenosis of the distal right M1 segment with significant signal loss. Distal small vessel disease is present in the MCA distributions bilaterally. No other significant proximal disease is present. There is no flow in the vertebral artery apart from minimal signal is proximal to the vertebrobasilar junction. There is a moderate stenosis of the residual right vertebral artery at the PICA. Irregularity is present throughout the basilar artery without a significant stenosis. There is mild irregularity of the proximal PCA vessels bilaterally without significant stenosis. Right greater than left distal PCA branch vessel attenuation is present. MRA NECK FINDINGS The time-of-flight images demonstrate no significant flow disturbance at either carotid bifurcation. There is no antegrade flow signal  in the left vertebral artery. The right common carotid artery is within normal limits. There is moderate stenosis at the right external carotid artery. No significant stenosis is present in the right ICA through the ICA terminus. The left common carotid artery is within normal limits. Bifurcation is unremarkable. There is no significant stenosis in the left ICA through the terminus. There is moderate narrowing at the origin of the dominant right vertebral artery. Additional narrowing is present at the dural margin of the right vertebral artery. Distal left vertebral artery occlusion is present. There is some signal in the left vertebral  artery to the level of the dura. IMPRESSION: 1. Acute/subacute nonhemorrhagic left occipital lobe infarct. This may be a lateral PCA or watershed infarct. 2. Additional acute/subacute nonhemorrhagic left ACA/MCA watershed infarcts over the left convexity. 3. Subcortical white matter disease is mildly advanced for age. This likely reflects the sequela of chronic microvascular ischemia. 4. 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 5. Contrast in the left vertebral artery proximal to the dural segment. Limited time-of-flight signal suggests slow or reversed flow. 6. Moderate distal right M1 segment stenosis. 7. Moderate distal left A1 segment stenosis. 8. Diffuse small vessel disease present on the MRA circle-of-Willis. 9. Mild irregularity in the basilar artery and proximal PCA vessels bilaterally with asymmetric attenuation of distal branches, right greater than left. Electronically Signed   By: San Morelle M.D.   On: 01/21/2019 09:07   Mr Brain Wo Contrast  Result Date: 01/21/2019 CLINICAL DATA:  Headache and weakness. Abnormal CT scan. Visual field deficits for 1 week. Gait abnormalities. Headaches. EXAM: MRI HEAD WITHOUT CONTRAST MRA HEAD WITHOUT CONTRAST MRA NECK WITHOUT AND WITH CONTRAST TECHNIQUE: Multiplanar, multiecho pulse sequences of the brain and surrounding structures were obtained without contrast. Angiographic images of the Circle of Willis were obtained using MRA technique without intravenous contrast. Angiographic images of the neck were obtained using MRA technique without and with intravenous contrast. Carotid stenosis measurements (when applicable) are obtained utilizing NASCET criteria, using the distal internal carotid diameter as the denominator. CONTRAST:  7 mL Gadavist COMPARISON:  CT head without contrast 01/20/2019 FINDINGS: MRI HEAD FINDINGS Brain: A left MCA/PCA acute/subacute watershed infarct is confirmed. Additional foci of restricted diffusion extend over the  convexity in the left MCA/ACA watershed territory. There is minimal, if any, involvement of the precentral gyrus. T2 signal changes are associated with the areas of acute/subacute infarction, compatible with a time frame over the last week. Minimal subcortical T2 changes are present in the right frontal lobe and more anterior left frontal lobe. Basal ganglia are intact. Insert normal IAC's The brainstem and cerebellum are within normal limits. Vascular: Flow is present in the major intracranial arteries. Skull and upper cervical spine: The craniocervical junction is normal. Upper cervical spine is within normal limits. Marrow signal is unremarkable. Sinuses/Orbits: Mild mucosal thickening is present within the ethmoid air cells. The paranasal sinuses and mastoid air cells are otherwise clear. MRA HEAD FINDINGS Atherosclerotic irregularity is present within the cavernous internal carotid arteries bilaterally. There is a moderate stenosis in the distal left A1 segment. The right A1 segments is normal. The left M1 segment is normal. There is a moderate stenosis of the distal right M1 segment with significant signal loss. Distal small vessel disease is present in the MCA distributions bilaterally. No other significant proximal disease is present. There is no flow in the vertebral artery apart from minimal signal is proximal to the vertebrobasilar junction. There is a moderate stenosis  of the residual right vertebral artery at the PICA. Irregularity is present throughout the basilar artery without a significant stenosis. There is mild irregularity of the proximal PCA vessels bilaterally without significant stenosis. Right greater than left distal PCA branch vessel attenuation is present. MRA NECK FINDINGS The time-of-flight images demonstrate no significant flow disturbance at either carotid bifurcation. There is no antegrade flow signal in the left vertebral artery. The right common carotid artery is within normal  limits. There is moderate stenosis at the right external carotid artery. No significant stenosis is present in the right ICA through the ICA terminus. The left common carotid artery is within normal limits. Bifurcation is unremarkable. There is no significant stenosis in the left ICA through the terminus. There is moderate narrowing at the origin of the dominant right vertebral artery. Additional narrowing is present at the dural margin of the right vertebral artery. Distal left vertebral artery occlusion is present. There is some signal in the left vertebral artery to the level of the dura. IMPRESSION: 1. Acute/subacute nonhemorrhagic left occipital lobe infarct. This may be a lateral PCA or watershed infarct. 2. Additional acute/subacute nonhemorrhagic left ACA/MCA watershed infarcts over the left convexity. 3. Subcortical white matter disease is mildly advanced for age. This likely reflects the sequela of chronic microvascular ischemia. 4. 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 5. Contrast in the left vertebral artery proximal to the dural segment. Limited time-of-flight signal suggests slow or reversed flow. 6. Moderate distal right M1 segment stenosis. 7. Moderate distal left A1 segment stenosis. 8. Diffuse small vessel disease present on the MRA circle-of-Willis. 9. Mild irregularity in the basilar artery and proximal PCA vessels bilaterally with asymmetric attenuation of distal branches, right greater than left. Electronically Signed   By: San Morelle M.D.   On: 01/21/2019 09:07   Ct Abdomen Pelvis W Contrast  Result Date: 01/20/2019 CLINICAL DATA:  Nausea and vomiting with pain, initial encounter EXAM: CT CHEST, ABDOMEN, AND PELVIS WITH CONTRAST TECHNIQUE: Multidetector CT imaging of the chest, abdomen and pelvis was performed following the standard protocol during bolus administration of intravenous contrast. CONTRAST:  160mL ISOVUE-300 IOPAMIDOL (ISOVUE-300) INJECTION 61% COMPARISON:   Plain film from earlier in the same day as well as CT from 02/21/2018 FINDINGS: CT CHEST FINDINGS Cardiovascular: Thoracic aorta is within normal limits. No cardiac enlargement is seen. The pulmonary artery as visualized is within normal limits. 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 Opacification of the more distal subclavian artery is noted secondary to a left carotid to subclavian bypass. Mediastinum/Nodes: Thoracic inlet is within normal limits.The esophagus demonstrates some diffuse wall thickening likely related to underlying reflux. The overall appearance is stable from the prior exam. No left hilar adenopathy is noted. No significant mediastinal adenopathy is noted. Scattered small right hilar lymph nodes are seen likely reactive in nature. Lungs/Pleura: Lungs are well aerated bilaterally. A few scattered calcified granulomas are seen. In the superior segment of the right lower lobe, there is a focal 3.8 x 4.3 cm mass lesion with central decreased attenuation identified consistent with fluid. Some suggestion of air within the central portion of the lesion is noted on the coronal and sagittal reconstructions . The lungs are otherwise clear. No sizable effusion is noted. Musculoskeletal: No acute bony abnormality is noted. CT ABDOMEN PELVIS FINDINGS Hepatobiliary: Fatty infiltration of the liver is noted. The gallbladder has been surgically removed. Pancreas: Unremarkable. No pancreatic ductal dilatation or surrounding inflammatory  changes. Spleen: Normal in size without focal abnormality. Adrenals/Urinary Tract: Adrenal glands are within normal limits. Kidneys are well visualized bilaterally without renal calculi or obstructive changes. Ureters are within normal limits. The bladder is partially distended. Stomach/Bowel: The appendix is within normal limits. No obstructive or inflammatory changes of the bowel are seen. Small sliding-type hiatal  hernia is noted as well as the aforementioned esophageal thickening distally consistent with reflux disease. Vascular/Lymphatic: Aortic atherosclerosis. No enlarged abdominal or pelvic lymph nodes. Reproductive: Uterus and bilateral adnexa are unremarkable. Other: No abdominal wall hernia or abnormality. No abdominopelvic ascites. Musculoskeletal: Degenerative changes of the lumbar spine are noted. IMPRESSION: Rounded masslike density in the superior segment of the right lower lobe as described. There is central decreased attenuation as well as a few foci of what appears to be air within on the sagittal and coronal reconstructions. Although this could represent a necrotic neoplasm, short-term treatment for necrotic pneumonia/lung abscess is recommended with repeat imaging to assess for resolution. If the lesion persists on subsequent imaging more aggressive workup can be performed. Chronic changes as described above. Electronically Signed   By: Inez Catalina M.D.   On: 01/20/2019 15:56   US Venous Img Lower Bilateral  Result Date: 01/21/2019 CLINICAL DATA:  Bilateral lower extremity pain and edema for the past 3 years. History of pulmonary embolism. Evaluate for acute or chronic DVT. EXAM: BILATERAL LOWER EXTREMITY VENOUS DOPPLER ULTRASOUND TECHNIQUE: Gray-scale sonography with graded compression, as well as color Doppler and duplex ultrasound were performed to evaluate the lower extremity deep venous systems from the level of the common femoral vein and including the common femoral, femoral, profunda femoral, popliteal and calf veins including the posterior tibial, peroneal and gastrocnemius veins when visible. The superficial great saphenous vein was also interrogated. Spectral Doppler was utilized to evaluate flow at rest and with distal augmentation maneuvers in the common femoral, femoral and popliteal veins. COMPARISON:  None. FINDINGS: RIGHT LOWER EXTREMITY Common Femoral Vein: No evidence of thrombus.  Normal compressibility, respiratory phasicity and response to augmentation. Saphenofemoral Junction: No evidence of thrombus. Normal compressibility and flow on color Doppler imaging. Profunda Femoral Vein: No evidence of thrombus. Normal compressibility and flow on color Doppler imaging. Femoral Vein: No evidence of thrombus. Normal compressibility, respiratory phasicity and response to augmentation. Popliteal Vein: No evidence of thrombus. Normal compressibility, respiratory phasicity and response to augmentation. Calf Veins: No evidence of thrombus. Normal compressibility and flow on color Doppler imaging. Superficial Great Saphenous Vein: No evidence of thrombus. Normal compressibility. Venous Reflux:  None. Other Findings: Minimal amount of eccentric echogenic plaque is seen within the right common femoral artery (image 2 and 3). LEFT LOWER EXTREMITY Common Femoral Vein: No evidence of thrombus. Normal compressibility, respiratory phasicity and response to augmentation. Saphenofemoral Junction: No evidence of thrombus. Normal compressibility and flow on color Doppler imaging. Profunda Femoral Vein: No evidence of thrombus. Normal compressibility and flow on color Doppler imaging. Femoral Vein: No evidence of thrombus. Normal compressibility, respiratory phasicity and response to augmentation. Popliteal Vein: No evidence of thrombus. Normal compressibility, respiratory phasicity and response to augmentation. Calf Veins: No evidence of thrombus. Normal compressibility and flow on color Doppler imaging. Superficial Great Saphenous Vein: No evidence of thrombus. Normal compressibility. Venous Reflux:  None. Other Findings:  None. IMPRESSION: No evidence acute or chronic DVT within either lower extremity. Electronically Signed   By: Sandi Mariscal M.D.   On: 01/21/2019 15:08   Dg Abd Acute 2+v W 1v Chest  Result Date:  01/20/2019 CLINICAL DATA:  Abdominal pain, nausea, vomiting, severe watery diarrhea, fevers and  decreased urinary output. Smoker. EXAM: DG ABDOMEN ACUTE W/ 1V CHEST COMPARISON:  Portable chest dated 03/19/2018, chest CTA dated 02/21/2018 and abdomen and pelvis CT dated 07/19/2017. FINDINGS: Normal sized heart. Interval 4.2 cm rounded mass-like density with poorly defined margins in the right mid lung on the frontal view of the chest. Moderate diffuse peribronchial thickening and accentuation of the interstitial markings with mild progression. Normal bowel gas pattern without free peritoneal air. Cholecystectomy clips. Minimal lumbar spine degenerative changes. IMPRESSION: 1. Interval 4.2 cm rounded mass-like density in the right mid lung. This is concerning for a primary lung neoplasm. A rounded area of pneumonia is less likely. Further evaluation with a chest CT with contrast is recommended. 2. Progressive bronchitic changes. 3. No acute abdominal abnormality. Electronically Signed   By: Claudie Revering M.D.   On: 01/20/2019 13:18     CBC Recent Labs  Lab 01/20/19 1021  WBC 11.3*  HGB 15.5*  HCT 46.4*  PLT 570*  MCV 83.3  MCH 27.8  MCHC 33.4  RDW 13.1    Chemistries  Recent Labs  Lab 01/20/19 1021 01/21/19 0505  NA 137 140  K 2.9* 3.0*  CL 96* 107  CO2 25 26  GLUCOSE 167* 97  BUN 12 8  CREATININE 0.74 0.64  CALCIUM 9.3 8.2*  MG 1.9  --   AST 18  --   ALT 17  --   ALKPHOS 94  --   BILITOT 0.8  --    ------------------------------------------------------------------------------------------------------------------ Recent Labs    01/21/19 0505  CHOL 134  HDL 30*  LDLCALC 83  TRIG 106  CHOLHDL 4.5    Lab Results  Component Value Date   HGBA1C 5.7 (H) 01/21/2019   ------------------------------------------------------------------------------------------------------------------ No results for input(s): TSH, T4TOTAL, T3FREE, THYROIDAB in the last 72 hours.  Invalid input(s):  FREET3 ------------------------------------------------------------------------------------------------------------------ No results for input(s): VITAMINB12, FOLATE, FERRITIN, TIBC, IRON, RETICCTPCT in the last 72 hours.  Coagulation profile No results for input(s): INR, PROTIME in the last 168 hours.  No results for input(s): DDIMER in the last 72 hours.  Cardiac Enzymes No results for input(s): CKMB, TROPONINI, MYOGLOBIN in the last 168 hours.  Invalid input(s): CK ------------------------------------------------------------------------------------------------------------------    Component Value Date/Time   BNP 15.0 02/06/2015 0335     Roxan Hockey M.D on 01/21/2019 at 5:06 PM  Go to [email or web address] - for contact info  [facility] - Office  [phone]

## 2019-01-21 NOTE — Progress Notes (Signed)
Noted PICC order.  Spoke to floor nurse to inform them that PICC nurse is unavailable at this time.  Will follow up after transfer to [facility].

## 2019-01-21 NOTE — Progress Notes (Addendum)
Initial Nutrition Assessment  DOCUMENTATION CODES:      INTERVENTION:  Boost Breeze po TID, each supplement provides 250 kcal and 9 grams of protein   ProStat 30 ml BID (each 30 ml provides 100 kcal, 15 gr protein)   Clear liquids per MD  Multivitamin  NUTRITION DIAGNOSIS:   Inadequate oral intake related to acute illness, vomiting-gastroenteritis as evidenced by per patient/family report(-clear liquid diet ).   GOAL:   Patient will meet greater than or equal to 90% of their needs  MONITOR:   Diet advancement, Supplement acceptance, PO intake, Weight trends, Labs  REASON FOR ASSESSMENT:   Malnutrition Screening Tool    ASSESSMENT: Patient is a 53 yo female with hx of peptic ulcer, GERD, Hiatal hernia, Chronic abdominal pain, barrett's esophagus, COPD, Chronic pancreatitis. Patient complains of loose stools and vomiting prior to admission. Gastroenteritis and left sided CVA (MRI) and CT of chest shows abnormal finding right upper lobe. Patient says she is being transferred to [facility].  Patient home diet is regular- she usually eats 2 meals and snacks when feeling well. The past 3 weeks patient endorses poor intake and intermittent vomiting. She reports weight loss but unable to quantify- she doesn't weigh herself at home. Hospital records show usual of 83-85 kg which reflects a 6% loss compared to 10 months ago.  Medications reviewed and include: KCL, Pepcid, Lipitor.    Labs: BMP Latest Ref Rng & Units 01/21/2019 01/20/2019 03/21/2018  Glucose 70 - 99 mg/dL 97 167(H) 134(H)  BUN 6 - 20 mg/dL 8 12 8   Creatinine 0.44 - 1.00 mg/dL 0.64 0.74 0.85  BUN/Creat Ratio 6 - 22 (calc) - - -  Sodium 135 - 145 mmol/L 140 137 140  Potassium 3.5 - 5.1 mmol/L 3.0(L) 2.9(L) 2.6(LL)  Chloride 98 - 111 mmol/L 107 96(L) 105  CO2 22 - 32 mmol/L 26 25 25   Calcium 8.9 - 10.3 mg/dL 8.2(L) 9.3 8.5(L)     NUTRITION - FOCUSED PHYSICAL EXAM:    Most Recent Value  Orbital Region  Mild  depletion  Upper Arm Region  No depletion  Thoracic and Lumbar Region  No depletion  Buccal Region  Mild depletion  Temple Region  Mild depletion  Clavicle Bone Region  No depletion  Clavicle and Acromion Bone Region  No depletion      Diet Order:   Diet Order            Diet Heart Room service appropriate? Yes; Fluid consistency: Thin  Diet effective now              EDUCATION NEEDS: none at this time   Skin:  Skin Assessment: Reviewed RN Assessment  Last BM:  1/26  Height:   Ht Readings from Last 1 Encounters:  01/20/19 5\' 6"  (1.676 m)    Weight:   Wt Readings from Last 1 Encounters:  01/20/19 78.6 kg    Ideal Body Weight:  59 kg  BMI:  Body mass index is 27.97 kg/m.  Estimated Nutritional Needs:   Kcal:  1517-6160 (24-26 kcal/kg/bw)  Protein:  90-100 gr  Fluid:  >1800 ml daily   Colman Cater MS,RD,CSG,LDN Office: [phone] Pager: [phone]

## 2019-01-21 NOTE — Evaluation (Signed)
Occupational Therapy Evaluation Patient Details Name: Rita [name] MRN: [id number] DOB: 1966/08/31 Today's Date: 01/21/2019    History of Present Illness Rita [name] is a 53 y.o. female with medical history significant for pancreatitis, esophagitis with dysphagia, left subclavian artery occlusion, who presented to the ED with complaints of multiple episodes of vomiting and loose stools daily over the past 2 weeks.  She reports ~18 loose stools yesterday, and multiple episodes of vomiting today, with some improvement with antiemetics.  In the ED.  She reports persistent abdominal pain, radiating to her back with mild improvement with pain medication given in the ED.  No recent antibiotic use in the past at least 3 weeks   Clinical Impression   Pt in bed and agreeable to participate in OT evaluation. Pt presents with complaints of changes to right side vision while a right side possible visual field cut in the right eye only. Discussed safety awareness, task modifications, and using additional head turns to scan her environment complete with no additional OT needs at this time.     Follow Up Recommendations  No OT follow up    Equipment Recommendations  None recommended by OT       Precautions / Restrictions Precautions Precautions: Other (comment) Precaution Comments: visual field deficit Restrictions Weight Bearing Restrictions: No      Mobility Bed Mobility Overal bed mobility: Independent                Transfers Overall transfer level: Independent                        ADL either performed or assessed with clinical judgement   ADL Overall ADL's : Independent;At baseline           Vision Baseline Vision/History: No visual deficits Vision Assessment?: Yes Eye Alignment: Within Functional Limits Ocular Range of Motion: Within Functional Limits Alignment/Gaze Preference: Within Defined Limits Tracking/Visual Pursuits: Decreased smoothness of eye  movement to LEFT inferior field;Requires cues, head turns, or add eye shifts to track Saccades: Within functional limits Convergence: Within functional limits Visual Fields: Right visual field deficit Depth Perception: Undershoots Additional Comments: Pt reports right side visual field deficit. Portion of OTR/L's face not complete on the right side. Reports it looked "filled in."             Pertinent Vitals/Pain Pain Assessment: No/denies pain     Hand Dominance Right   Extremity/Trunk Assessment Upper Extremity Assessment Upper Extremity Assessment: Overall WFL for tasks assessed   Lower Extremity Assessment Lower Extremity Assessment: Defer to PT evaluation       Communication Communication Communication: No difficulties   Cognition Arousal/Alertness: Awake/alert Behavior During Therapy: WFL for tasks assessed/performed Overall Cognitive Status: Within Functional Limits for tasks assessed                    Home Living Family/patient expects to be discharged to:: Private residence Living Arrangements: Spouse/significant other Available Help at Discharge: Friend(s);Available PRN/intermittently          Prior Functioning/Environment Level of Independence: Independent                 OT Problem List: Impaired vision/perception                          AM-PAC OT "6 Clicks" Daily Activity     Outcome Measure Help from another person eating meals?: None Help from  another person taking care of personal grooming?: None Help from another person toileting, which includes using toliet, bedpan, or urinal?: None Help from another person bathing (including washing, rinsing, drying)?: None Help from another person to put on and taking off regular upper body clothing?: None Help from another person to put on and taking off regular lower body clothing?: None 6 Click Score: 24   End of Session    Activity Tolerance: Patient tolerated treatment  well Patient left: in bed;with call bell/phone within reach  OT Visit Diagnosis: Low vision, both eyes (H54.2)                Time: 3557-3220 OT Time Calculation (min): 21 min Charges:  OT General Charges $OT Visit: 1 Visit OT Evaluation $OT Eval Low Complexity: 1 Low  Ailene Ravel, OTR/L,CBIS  [phone]   Tobby Fawcett, Clarene Duke 01/21/2019, 10:07 AM

## 2019-01-21 NOTE — Consult Note (Signed)
Consult requested by: [facility] Consult requested for: Abnormal chest CT  HPI: This is a 53 year old who has multiple medical problems including Barrett's esophagus, chronic abdominal pain, chronic lower back pain, chronic pancreatitis, COPD with ongoing smoking and previous episodes of pneumonia.  She says that she came to the emergency department because of abdominal pain nausea and vomiting and diarrhea.  She also had increasing cough and congestion chest pain when she takes a deep breath and production of large amounts of discolored sputum.  She was also found to have an acute left CVA.  CT of the chest which I have personally reviewed shows a mass lesion in the right upper lobe that could be cancer versus pneumonia.  She is being treated for pneumonia.  She has trouble with gait likely related to the CVA.  She has visual field defect.  Past Medical History:  Diagnosis Date  . Arthritis    "qwhere" (02/22/2018)  . Barrett's esophagus with esophagitis 03/26/2013  . Chronic abdominal pain   . Chronic lower back pain   . Chronic pancreatitis ([place])   . COPD (chronic obstructive pulmonary disease) ([place])   . Depression   . Dyspnea   . GERD (gastroesophageal reflux disease)   . Heart murmur   . Hiatal hernia   . High cholesterol   . Hypertension   . Peptic ulcer   . Pneumonia 2000s X 1  . Tobacco use 03/26/2013     Family History  Problem Relation Age of Onset  . Asthma Mother   . Heart failure Mother   . Cancer Mother        pancreatic  . Diabetes Mother   . Hypertension Mother   . Stroke Mother   . Pancreatic cancer Mother        deceased  . Heart failure Father   . Diabetes Father   . Colon cancer Neg Hx      Social History   Socioeconomic History  . Marital status: Divorced    Spouse name: Not on file  . Number of children: Not on file  . Years of education: Not on file  . Highest education level: Not on file  Occupational History  . Occupation: farm  Social  Needs  . Financial resource strain: Not on file  . Food insecurity:    Worry: Not on file    Inability: Not on file  . Transportation needs:    Medical: Not on file    Non-medical: Not on file  Tobacco Use  . Smoking status: Current Every Day Smoker    Packs/day: 0.50    Years: 41.00    Pack years: 20.50    Types: Cigarettes    Start date: 09/26/1982  . Smokeless tobacco: Never Used  Substance and Sexual Activity  . Alcohol use: No  . Drug use: No  . Sexual activity: Not Currently    Partners: Male    Birth control/protection: Surgical  Lifestyle  . Physical activity:    Days per week: Not on file    Minutes per session: Not on file  . Stress: Not on file  Relationships  . Social connections:    Talks on phone: Not on file    Gets together: Not on file    Attends religious service: Not on file    Active member of club or organization: Not on file    Attends meetings of clubs or organizations: Not on file    Relationship status: Not on file  Other Topics Concern  . Not on file  Social History Narrative   [place]ives in [place], [place].   Is on disability, was a Psychologist, sport and exercise and drove tractors.    Wears seatbelt.   Cannot eat meat due to esophagus.   Smokes cigarettes.   Drinks sodas, 1/2 liter a day.    3 boys and 1 girl. 2 boys live in [place]. 1 son died in 81th month of pregnancy.   Live with friend and his wife at this time.   Used to be married and was abandoned, he would not give her a divorce.        ROS: Except as mentioned 10 point review of systems is negative    Objective: Vital signs in last 24 hours: Temp:  [97.8 F (36.6 C)-98.4 F (36.9 C)] 98 F (36.7 C) (01/27 0533) Pulse Rate:  [63-93] 63 (01/27 0533) Resp:  [11-22] 19 (01/27 0533) BP: (93-181)/(67-96) 107/68 (01/27 0533) SpO2:  [98 %-100 %] 100 % (01/27 0533) Weight:  [78.6 kg-84.3 kg] 78.6 kg (01/26 2013) Weight change:  Last BM Date: 01/20/19  Intake/Output from previous day: 01/26 0701 -  01/27 0700 In: 2051 [I.V.:399.5; IV Piggyback:1651.5] Out: -   PHYSICAL EXAM Constitutional: She is awake and alert and in no acute distress.  She is coughing during the exam.  Eyes: Pupils react EOMI.  I did not do visual field confrontation.  Ears nose mouth and throat: Mucous membranes are slightly dry.  Neck is supple.  Respiratory: She has some rhonchi on the right more than the left.  Cardiovascular: Her heart is regular without gallop.  Gastrointestinal: Her abdomen is minimally tender with hyperactive bowel sounds.  Musculoskeletal: Normal strength bilaterally.  Psychiatric: She is mildly anxious.  Neurological: She has visual field defects by history which I did not retest.  Lab Results: Basic Metabolic Panel: Recent Labs    01/20/19 1021 01/21/19 0505  NA 137 140  K 2.9* 3.0*  CL 96* 107  CO2 25 26  GLUCOSE 167* 97  BUN 12 8  CREATININE 0.74 0.64  CALCIUM 9.3 8.2*  MG 1.9  --    Liver Function Tests: Recent Labs    01/20/19 1021  AST 18  ALT 17  ALKPHOS 94  BILITOT 0.8  PROT 8.0  ALBUMIN 3.7   Recent Labs    01/20/19 1021  LIPASE 57*   No results for input(s): AMMONIA in the last 72 hours. CBC: Recent Labs    01/20/19 1021  WBC 11.3*  HGB 15.5*  HCT 46.4*  MCV 83.3  PLT 570*   Cardiac Enzymes: No results for input(s): CKTOTAL, CKMB, CKMBINDEX, TROPONINI in the last 72 hours. BNP: No results for input(s): PROBNP in the last 72 hours. D-Dimer: No results for input(s): DDIMER in the last 72 hours. CBG: No results for input(s): GLUCAP in the last 72 hours. Hemoglobin A1C: No results for input(s): HGBA1C in the last 72 hours. Fasting Lipid Panel: Recent Labs    01/21/19 0505  CHOL 134  HDL 30*  LDLCALC 83  TRIG 106  CHOLHDL 4.5   Thyroid Function Tests: No results for input(s): TSH, T4TOTAL, FREET4, T3FREE, THYROIDAB in the last 72 hours. Anemia Panel: No results for input(s): VITAMINB12, FOLATE, FERRITIN, TIBC, IRON, RETICCTPCT in the  last 72 hours. Coagulation: No results for input(s): LABPROT, INR in the last 72 hours. Urine Drug Screen: Drugs of Abuse     Component Value Date/Time   LABOPIA POSITIVE (A) 12/10/2010 1431  COCAINSCRNUR POSITIVE (A) 12/10/2010 1431   LABBENZ POSITIVE (A) 12/10/2010 1431   AMPHETMU NONE DETECTED 12/10/2010 1431   THCU NONE DETECTED 12/10/2010 1431   LABBARB  12/10/2010 1431    NONE DETECTED        DRUG SCREEN FOR MEDICAL PURPOSES ONLY.  IF CONFIRMATION IS NEEDED FOR ANY PURPOSE, NOTIFY LAB WITHIN 5 DAYS.        LOWEST DETECTABLE LIMITS FOR URINE DRUG SCREEN Drug Class       Cutoff (ng/mL) Amphetamine      1000 Barbiturate      200 Benzodiazepine   427 Tricyclics       062 Opiates          300 Cocaine          300 THC              50    Alcohol Level: No results for input(s): ETH in the last 72 hours. Urinalysis: Recent Labs    01/20/19 1430  COLORURINE YELLOW  LABSPEC 1.013  PHURINE 8.0  GLUCOSEU NEGATIVE  HGBUR NEGATIVE  BILIRUBINUR NEGATIVE  KETONESUR 5*  PROTEINUR NEGATIVE  NITRITE NEGATIVE  LEUKOCYTESUR NEGATIVE   Misc. Labs:   ABGS: No results for input(s): PHART, PO2ART, TCO2, HCO3 in the last 72 hours.  Invalid input(s): PCO2   MICROBIOLOGY: No results found for this or any previous visit (from the past 240 hour(s)).  Studies/Results: Ct Head Wo Contrast  Result Date: 01/20/2019 CLINICAL DATA:  Right-sided visual disturbance. EXAM: CT HEAD WITHOUT CONTRAST TECHNIQUE: Contiguous axial images were obtained from the base of the skull through the vertex without intravenous contrast. COMPARISON:  11/29/2009 FINDINGS: Brain: There is acute/subacute infarction in the left parieto-occipital junction region. There may be minimal petechial blood products but there is no frank hematoma. Mild swelling but no significant mass effect or shift. The remainder the brain appears negative. Vascular: No significant vascular finding. Ordinary atherosclerotic  calcification in the carotid siphon regions. No finding on this noncontrast study to suggest venous thrombosis. Skull: Normal Sinuses/Orbits: Clear/normal Other: None IMPRESSION: Acute/subacute infarction in the left parieto-occipital junction region. Minimal petechial blood products but no frank hematoma. Mild swelling but no significant mass effect or shift. Electronically Signed   By: Nelson Chimes M.D.   On: 01/20/2019 19:15   Ct Chest W Contrast  Result Date: 01/20/2019 CLINICAL DATA:  Nausea and vomiting with pain, initial encounter EXAM: CT CHEST, ABDOMEN, AND PELVIS WITH CONTRAST TECHNIQUE: Multidetector CT imaging of the chest, abdomen and pelvis was performed following the standard protocol during bolus administration of intravenous contrast. CONTRAST:  156mL ISOVUE-300 IOPAMIDOL (ISOVUE-300) INJECTION 61% COMPARISON:  Plain film from earlier in the same day as well as CT from 02/21/2018 FINDINGS: CT CHEST FINDINGS Cardiovascular: Thoracic aorta is within normal limits. No cardiac enlargement is seen. The pulmonary artery as visualized is within normal limits. 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 Opacification of the more distal subclavian artery is noted secondary to a left carotid to subclavian bypass. Mediastinum/Nodes: Thoracic inlet is within normal limits.The esophagus demonstrates some diffuse wall thickening likely related to underlying reflux. The overall appearance is stable from the prior exam. No left hilar adenopathy is noted. No significant mediastinal adenopathy is noted. Scattered small right hilar lymph nodes are seen likely reactive in nature. Lungs/Pleura: Lungs are well aerated bilaterally. A few scattered calcified granulomas are seen. In the superior segment of the right lower  lobe, there is a focal 3.8 x 4.3 cm mass lesion with central decreased attenuation identified consistent with fluid. Some suggestion of air  within the central portion of the lesion is noted on the coronal and sagittal reconstructions . The lungs are otherwise clear. No sizable effusion is noted. Musculoskeletal: No acute bony abnormality is noted. CT ABDOMEN PELVIS FINDINGS Hepatobiliary: Fatty infiltration of the liver is noted. The gallbladder has been surgically removed. Pancreas: Unremarkable. No pancreatic ductal dilatation or surrounding inflammatory changes. Spleen: Normal in size without focal abnormality. Adrenals/Urinary Tract: Adrenal glands are within normal limits. Kidneys are well visualized bilaterally without renal calculi or obstructive changes. Ureters are within normal limits. The bladder is partially distended. Stomach/Bowel: The appendix is within normal limits. No obstructive or inflammatory changes of the bowel are seen. Small sliding-type hiatal hernia is noted as well as the aforementioned esophageal thickening distally consistent with reflux disease. Vascular/Lymphatic: Aortic atherosclerosis. No enlarged abdominal or pelvic lymph nodes. Reproductive: Uterus and bilateral adnexa are unremarkable. Other: No abdominal wall hernia or abnormality. No abdominopelvic ascites. Musculoskeletal: Degenerative changes of the lumbar spine are noted. IMPRESSION: Rounded masslike density in the superior segment of the right lower lobe as described. There is central decreased attenuation as well as a few foci of what appears to be air within on the sagittal and coronal reconstructions. Although this could represent a necrotic neoplasm, short-term treatment for necrotic pneumonia/lung abscess is recommended with repeat imaging to assess for resolution. If the lesion persists on subsequent imaging more aggressive workup can be performed. Chronic changes as described above. Electronically Signed   By: Inez Catalina M.D.   On: 01/20/2019 15:56   Ct Abdomen Pelvis W Contrast  Result Date: 01/20/2019 CLINICAL DATA:  Nausea and vomiting with  pain, initial encounter EXAM: CT CHEST, ABDOMEN, AND PELVIS WITH CONTRAST TECHNIQUE: Multidetector CT imaging of the chest, abdomen and pelvis was performed following the standard protocol during bolus administration of intravenous contrast. CONTRAST:  153mL ISOVUE-300 IOPAMIDOL (ISOVUE-300) INJECTION 61% COMPARISON:  Plain film from earlier in the same day as well as CT from 02/21/2018 FINDINGS: CT CHEST FINDINGS Cardiovascular: Thoracic aorta is within normal limits. No cardiac enlargement is seen. The pulmonary artery as visualized is within normal limits. 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 Opacification of the more distal subclavian artery is noted secondary to a left carotid to subclavian bypass. Mediastinum/Nodes: Thoracic inlet is within normal limits.The esophagus demonstrates some diffuse wall thickening likely related to underlying reflux. The overall appearance is stable from the prior exam. No left hilar adenopathy is noted. No significant mediastinal adenopathy is noted. Scattered small right hilar lymph nodes are seen likely reactive in nature. Lungs/Pleura: Lungs are well aerated bilaterally. A few scattered calcified granulomas are seen. In the superior segment of the right lower lobe, there is a focal 3.8 x 4.3 cm mass lesion with central decreased attenuation identified consistent with fluid. Some suggestion of air within the central portion of the lesion is noted on the coronal and sagittal reconstructions . The lungs are otherwise clear. No sizable effusion is noted. Musculoskeletal: No acute bony abnormality is noted. CT ABDOMEN PELVIS FINDINGS Hepatobiliary: Fatty infiltration of the liver is noted. The gallbladder has been surgically removed. Pancreas: Unremarkable. No pancreatic ductal dilatation or surrounding inflammatory changes. Spleen: Normal in size without focal abnormality. Adrenals/Urinary Tract: Adrenal glands are  within normal limits. Kidneys are well visualized bilaterally without renal calculi or  obstructive changes. Ureters are within normal limits. The bladder is partially distended. Stomach/Bowel: The appendix is within normal limits. No obstructive or inflammatory changes of the bowel are seen. Small sliding-type hiatal hernia is noted as well as the aforementioned esophageal thickening distally consistent with reflux disease. Vascular/Lymphatic: Aortic atherosclerosis. No enlarged abdominal or pelvic lymph nodes. Reproductive: Uterus and bilateral adnexa are unremarkable. Other: No abdominal wall hernia or abnormality. No abdominopelvic ascites. Musculoskeletal: Degenerative changes of the lumbar spine are noted. IMPRESSION: Rounded masslike density in the superior segment of the right lower lobe as described. There is central decreased attenuation as well as a few foci of what appears to be air within on the sagittal and coronal reconstructions. Although this could represent a necrotic neoplasm, short-term treatment for necrotic pneumonia/lung abscess is recommended with repeat imaging to assess for resolution. If the lesion persists on subsequent imaging more aggressive workup can be performed. Chronic changes as described above. Electronically Signed   By: Inez Catalina M.D.   On: 01/20/2019 15:56   Dg Abd Acute 2+v W 1v Chest  Result Date: 01/20/2019 CLINICAL DATA:  Abdominal pain, nausea, vomiting, severe watery diarrhea, fevers and decreased urinary output. Smoker. EXAM: DG ABDOMEN ACUTE W/ 1V CHEST COMPARISON:  Portable chest dated 03/19/2018, chest CTA dated 02/21/2018 and abdomen and pelvis CT dated 07/19/2017. FINDINGS: Normal sized heart. Interval 4.2 cm rounded mass-like density with poorly defined margins in the right mid lung on the frontal view of the chest. Moderate diffuse peribronchial thickening and accentuation of the interstitial markings with mild progression. Normal bowel gas pattern without  free peritoneal air. Cholecystectomy clips. Minimal lumbar spine degenerative changes. IMPRESSION: 1. Interval 4.2 cm rounded mass-like density in the right mid lung. This is concerning for a primary lung neoplasm. A rounded area of pneumonia is less likely. Further evaluation with a chest CT with contrast is recommended. 2. Progressive bronchitic changes. 3. No acute abdominal abnormality. Electronically Signed   By: Claudie Revering M.D.   On: 01/20/2019 13:18    Medications:  Prior to Admission:  Medications Prior to Admission  Medication Sig Dispense Refill Last Dose  . Buprenorphine HCl-Naloxone HCl 8-2 MG FILM Take 1 Film by mouth daily. 1/2 film under the tongue   Past Week at Unknown time  . dexlansoprazole (DEXILANT) 60 MG capsule Take 1 capsule (60 mg total) by mouth at bedtime. 90 capsule 3 01/19/2019 at Unknown time  . amLODipine (NORVASC) 5 MG tablet Take 1 tablet (5 mg total) by mouth daily. (Patient taking differently: Take 5 mg by mouth at bedtime. ) 30 tablet 0 03/18/2018 at Unknown time  . amLODipine (NORVASC) 5 MG tablet TAKE 1 TABLET BY MOUTH DAILY 30 tablet 0   . aspirin EC 81 MG tablet Take 1 tablet (81 mg total) by mouth daily. (Patient taking differently: Take 81 mg by mouth at bedtime. )   03/18/2018 at Unknown time  . atorvastatin (LIPITOR) 40 MG tablet Take 1 tablet (40 mg total) by mouth daily. (Patient taking differently: Take 40 mg by mouth at bedtime. ) 90 tablet 3 03/18/2018 at Unknown time  . escitalopram (LEXAPRO) 10 MG tablet Take 1 tablet (10 mg total) by mouth daily. (Patient taking differently: Take 10 mg by mouth at bedtime. ) 30 tablet 0 03/18/2018 at Unknown time  . escitalopram (LEXAPRO) 10 MG tablet TAKE 1 TABLET BY MOUTH DAILY 30 tablet 0   . metoCLOPramide (REGLAN) 5 MG tablet Take 1 tablet (5 mg total) by mouth  3 (three) times daily before meals. 90 tablet 0 03/18/2018 at Unknown time  . ondansetron (ZOFRAN) 4 MG tablet Take 1 tablet (4 mg total) by mouth every 8  (eight) hours as needed for nausea or vomiting. 30 tablet 3 03/18/2018 at Unknown time  . Oxycodone HCl 10 MG TABS Take 1 tablet (10 mg total) by mouth every 6 (six) hours as needed. 10 tablet 0   . potassium chloride SA (K-DUR,KLOR-CON) 20 MEQ tablet Take 20 mEq by mouth at bedtime.    03/18/2018 at Unknown time  . traZODone (DESYREL) 50 MG tablet Take 1 tablet (50 mg total) by mouth at bedtime. 30 tablet 3 Past Month at Unknown time   Scheduled: . aspirin EC  81 mg Oral Daily  . atorvastatin  80 mg Oral q1800  . feeding supplement  1 Container Oral TID BM  . sodium chloride flush  3 mL Intravenous Once   Continuous: . ampicillin-sulbactam (UNASYN) IV 3 g (01/21/19 0116)  . doxycycline (VIBRAMYCIN) IV 100 mg (01/21/19 1423)  . famotidine Stopped (01/21/19 0041)   TRV:UYEBXIDHWYSHU (DILAUDID) injection, ondansetron **OR** ondansetron (ZOFRAN) IV  Assesment: She was admitted with gastroenteritis but has multiple other problems.  She has what appears to be a mass lesion in the right lung pneumonia versus cancer.  Agree with treating her for pneumonia.  This is in the superior segment of the right so could be related to aspiration.  Agree with Unasyn.  She has had a stroke and is just been down for MRI which is not available yet.  She has had trouble with her esophagus which is being treated Principal Problem:   Gastroenteritis Active Problems:   Left sided cerebral hemisphere cerebrovascular accident (CVA) ([place])    Plan: Continue current treatments.  No changes in meds.    LOS: 0 days   Alonza Bogus 01/21/2019, 9:07 AM

## 2019-01-21 NOTE — Evaluation (Signed)
Physical Therapy Evaluation Patient Details Name: Rita [name] MRN: [id number] DOB: Jan 10, 1966 Today's Date: 01/21/2019   History of Present Illness  Rita [name] is a 53 y.o. female with medical history significant for pancreatitis, esophagitis with dysphagia, left subclavian artery occlusion, who presented to the ED with complaints of multiple episodes of vomiting and loose stools daily over the past 2 weeks.  She reports ~18 loose stools yesterday, and multiple episodes of vomiting today, with some improvement with antiemetics.  In the ED.  She reports persistent abdominal pain, radiating to her back with mild improvement with pain medication given in the ED.  No recent antibiotic use in the past at least 3 weeks.   Clinical Impression  Patient presents supine in bed and is agreeable to PT evaluation. Patient performs bed mobility, transfers, and ambulates around the room completing turns, sidestepping, and taking steps backwards without assistive devices or increased time to complete tasks. Patient reports feeling back at her baseline today. Physical therapy evaluation completed, patient is at baseline and no further PT services recommended at this time. Patient discharged to care of nursing for ambulation daily as tolerated for length of stay.     Follow Up Recommendations No PT follow up    Equipment Recommendations  None recommended by PT    Recommendations for Other Services       Precautions / Restrictions Precautions Precautions: Other (comment) Precaution Comments: visual field deficit Restrictions Weight Bearing Restrictions: No      Mobility  Bed Mobility Overal bed mobility: Independent                Transfers Overall transfer level: Independent Equipment used: None                Ambulation/Gait Ambulation/Gait assistance: Independent   Assistive device: None Gait Pattern/deviations: WFL(Within Functional Limits) Gait velocity: slightly  decreased      Stairs            Wheelchair Mobility    Modified Rankin (Stroke Patients Only) Modified Rankin (Stroke Patients Only) Pre-Morbid Rankin Score: No symptoms Modified Rankin: No significant disability(Patient reports decreased sensation on right lower extremity compared to left)     Balance Overall balance assessment: Independent                                           Pertinent Vitals/Pain Pain Assessment: No/denies pain    Home Living Family/patient expects to be discharged to:: Private residence Living Arrangements: Spouse/significant other Available Help at Discharge: Friend(s);Available PRN/intermittently Type of Home: House Home Access: Stairs to enter Entrance Stairs-Rails: Right Entrance Stairs-Number of Steps: 1 Home Layout: One level Home Equipment: None      Prior Function Level of Independence: Independent         Comments: Patient reports being independent with driving, community ambulation without assistive device, and all ADLs, but 3 weeks ago returned home from visiting family in [place] with weakness, nausea and vomitting     Hand Dominance   Dominant Hand: Right    Extremity/Trunk Assessment   Upper Extremity Assessment Upper Extremity Assessment: Defer to OT evaluation    Lower Extremity Assessment Lower Extremity Assessment: Overall WFL for tasks assessed    Cervical / Trunk Assessment Cervical / Trunk Assessment: Normal  Communication   Communication: No difficulties  Cognition Arousal/Alertness: Awake/alert Behavior During Therapy: WFL for tasks assessed/performed  Overall Cognitive Status: Within Functional Limits for tasks assessed                                        General Comments      Exercises     Assessment/Plan    PT Assessment Patent does not need any further PT services  PT Problem List         PT Treatment Interventions      PT Goals (Current  goals can be found in the Care Plan section)  Acute Rehab PT Goals Patient Stated Goal: return home with family support PT Goal Formulation: With patient Time For Goal Achievement: 01/21/19 Potential to Achieve Goals: Good    Frequency     Barriers to discharge        Co-evaluation               AM-PAC PT "6 Clicks" Mobility  Outcome Measure Help needed turning from your back to your side while in a flat bed without using bedrails?: None Help needed moving from lying on your back to sitting on the side of a flat bed without using bedrails?: None Help needed moving to and from a bed to a chair (including a wheelchair)?: None Help needed standing up from a chair using your arms (e.g., wheelchair or bedside chair)?: None Help needed to walk in hospital room?: None Help needed climbing 3-5 steps with a railing? : None 6 Click Score: 24    End of Session   Activity Tolerance: Patient tolerated treatment well Patient left: in chair;with call bell/phone within reach        Time: 0935-0955 PT Time Calculation (min) (ACUTE ONLY): 20 min   Charges:   PT Evaluation $PT Eval Moderate Complexity: 1 Mod          10:20 AM, 01/21/19 Talbot Grumbling, DPT Physical Therapist with [facility] [phone] office

## 2019-01-21 NOTE — Progress Notes (Signed)
Patient reported having chest pain when I entered room. Notified Rodman Key, RN. Patient stated she wanted to wait to have echo because her chest is raw and hurting. Will check later.

## 2019-01-21 NOTE — Plan of Care (Signed)
Patient complaining of chest pain.  Vitals stable.  Text Dr. to inform.

## 2019-01-22 ENCOUNTER — Encounter (HOSPITAL_COMMUNITY)
Admission: EM | Disposition: A | Discharge status: Home / Self Care | Payer: Self-pay | Source: Home / Self Care | Attending: Family Medicine

## 2019-01-22 ENCOUNTER — Inpatient Hospital Stay: Payer: Self-pay

## 2019-01-22 LAB — COMPREHENSIVE METABOLIC PANEL
ALT: 11 U/L (ref 0–44)
AST: 11 U/L — ABNORMAL LOW (ref 15–41)
Albumin: 2.6 g/dL — ABNORMAL LOW (ref 3.5–5.0)
Alkaline Phosphatase: 53 U/L (ref 38–126)
Anion gap: 9 (ref 5–15)
BUN: 8 mg/dL (ref 6–20)
CO2: 24 mmol/L (ref 22–32)
Calcium: 8.1 mg/dL — ABNORMAL LOW (ref 8.9–10.3)
Chloride: 108 mmol/L (ref 98–111)
Creatinine, Ser: 0.61 mg/dL (ref 0.44–1.00)
GFR calc Af Amer: >60 mL/min (ref >60)
GFR calc non Af Amer: >60 mL/min (ref >60)
Glucose, Bld: 123 mg/dL — ABNORMAL HIGH (ref 70–99)
POTASSIUM: 2.7 mmol/L — AB (ref 3.5–5.1)
Sodium: 141 mmol/L (ref 135–145)
Total Bilirubin: 0.2 mg/dL — ABNORMAL LOW (ref 0.3–1.2)
Total Protein: 5.7 g/dL — ABNORMAL LOW (ref 6.5–8.1)

## 2019-01-22 LAB — CBC
HCT: 33.6 % — ABNORMAL LOW (ref 36.0–46.0)
Hemoglobin: 10.8 g/dL — ABNORMAL LOW (ref 12.0–15.0)
MCH: 28.3 pg (ref 26.0–34.0)
MCHC: 32.1 g/dL (ref 30.0–36.0)
MCV: 88.2 fL (ref 80.0–100.0)
NRBC: 0 % (ref 0.0–0.2)
Platelets: 472 10*3/uL — ABNORMAL HIGH (ref 150–400)
RBC: 3.81 MIL/uL — ABNORMAL LOW (ref 3.87–5.11)
RDW: 13.3 % (ref 11.5–15.5)
WBC: 8.5 10*3/uL (ref 4.0–10.5)

## 2019-01-22 LAB — HIV ANTIBODY (ROUTINE TESTING W REFLEX): HIV Screen 4th Generation wRfx: NONREACTIVE

## 2019-01-22 SURGERY — ECHOCARDIOGRAM, TRANSESOPHAGEAL
Anesthesia: Moderate Sedation

## 2019-01-22 MED ORDER — POTASSIUM CHLORIDE CRYS ER 20 MEQ PO TBCR
40.0000 meq | EXTENDED_RELEASE_TABLET | Freq: Once | ORAL | Status: AC
  Administered 2019-01-22: 40 meq via ORAL
  Filled 2019-01-22: qty 4

## 2019-01-22 MED ORDER — IPRATROPIUM-ALBUTEROL 0.5-2.5 (3) MG/3ML IN SOLN
3.0000 mL | Freq: Three times a day (TID) | RESPIRATORY_TRACT | Status: DC
  Administered 2019-01-22 (×2): 3 mL via RESPIRATORY_TRACT
  Filled 2019-01-22: qty 3

## 2019-01-22 MED ORDER — POTASSIUM CHLORIDE CRYS ER 20 MEQ PO TBCR
40.0000 meq | EXTENDED_RELEASE_TABLET | Freq: Once | ORAL | Status: AC
  Administered 2019-01-22: 40 meq via ORAL
  Filled 2019-01-22: qty 2

## 2019-01-22 MED ORDER — ALUM & MAG HYDROXIDE-SIMETH 200-200-20 MG/5ML PO SUSP
30.0000 mL | ORAL | Status: DC | PRN
  Administered 2019-01-22 – 2019-01-24 (×3): 30 mL via ORAL
  Filled 2019-01-22 (×3): qty 30

## 2019-01-22 MED ORDER — IPRATROPIUM-ALBUTEROL 0.5-2.5 (3) MG/3ML IN SOLN
3.0000 mL | Freq: Two times a day (BID) | RESPIRATORY_TRACT | Status: DC
  Administered 2019-01-22: 3 mL via RESPIRATORY_TRACT
  Filled 2019-01-22 (×2): qty 3

## 2019-01-22 MED ORDER — FAMOTIDINE 20 MG PO TABS
20.0000 mg | ORAL_TABLET | Freq: Two times a day (BID) | ORAL | Status: DC
  Administered 2019-01-22 – 2019-01-24 (×6): 20 mg via ORAL
  Filled 2019-01-22 (×6): qty 1

## 2019-01-22 NOTE — Consult Note (Addendum)
Neurology Consultation  Reason for Consult: Stroke Referring Physician: Denton Brick  CC: Nausea vomiting and abnormal gait  History is obtained from: Patient and chart  HPI: Rita [name] is a 53 y.o. female who smokes approximately half a pack to a pack a day, has history of hypertension, hypercholesterolemia, depression, Barrett's esophagus.  Patient and husband state that symptoms have been going on for approximately 3 weeks.  Patient did not see a physician secondary to thinking that she had the flu.  Symptoms included gait instability, nausea, vomiting, and decreased vision on the right side.  Per husband the reason she did not also see a physician where she is "hard headed".  Patient went [facility] however it took 24 hours for her to be transferred.  Currently she still has a right hemianopsia, right nasolabial fold decrease and decrease sensation on the right.  In addition husband also states that she has been having chest pain and SOB for a while.  There was a CT of the chest and abdomen done over at [facility] that was concerning for a right middle lobe mass- unclear if malignant or not.  She was transferred over to [facility] for further evaluation of that with a biopsy or another modality other than the CTA.  The stroke attending had been called and recommended doing a TEE if no other cause could be identified for the stroke.  LKW: 3 weeks ago tpa given?: no, out of window Premorbid modified Rankin scale (mRS): 0 NIHSS: 3   ROS: ROS was performed and is negative except as noted in the HPI.   Past Medical History:  Diagnosis Date  . Arthritis    "qwhere" (02/22/2018)  . Barrett's esophagus with esophagitis 03/26/2013  . Chronic abdominal pain   . Chronic lower back pain   . Chronic pancreatitis ([place])   . COPD (chronic obstructive pulmonary disease) ([place])   . Depression   . Dyspnea   . GERD (gastroesophageal reflux disease)   . Heart murmur   . Hiatal  hernia   . High cholesterol   . Hypertension   . Peptic ulcer   . Pneumonia 2000s X 1  . Tobacco use 03/26/2013    Family History  Problem Relation Age of Onset  . Asthma Mother   . Heart failure Mother   . Cancer Mother        pancreatic  . Diabetes Mother   . Hypertension Mother   . Stroke Mother   . Pancreatic cancer Mother        deceased  . Heart failure Father   . Diabetes Father   . Colon cancer Neg Hx    Social History:   reports that she has been smoking cigarettes. She started smoking about 36 years ago. She has a 20.50 pack-year smoking history. She has never used smokeless tobacco. She reports that she does not drink alcohol or use drugs.  Medications  Current Facility-Administered Medications:  .  albuterol (PROVENTIL) (2.5 MG/3ML) 0.083% nebulizer solution 2.5 mg, 2.5 mg, Nebulization, Q2H PRN, Emokpae, Courage, MD .  alum & mag hydroxide-simeth (MAALOX/MYLANTA) 200-200-20 MG/5ML suspension 30 mL, 30 mL, Oral, Q4H PRN, Emokpae, Courage, MD, 30 mL at 01/22/19 0145 .  Ampicillin-Sulbactam (UNASYN) 3 g in sodium chloride 0.9 % 100 mL IVPB, 3 g, Intravenous, Q6H, Coffee, Donna Christen, [facility], Last Rate: 200 mL/hr at 01/21/19 0931, 3 g at 01/21/19 0931 .  aspirin EC tablet 81 mg, 81 mg, Oral, Daily, Emokpae, Ejiroghene  E, MD, 81 mg at 01/22/19 0823 .  atorvastatin (LIPITOR) tablet 80 mg, 80 mg, Oral, q1800, Emokpae, Ejiroghene E, MD, 80 mg at 01/21/19 1705 .  famotidine (PEPCID) tablet 20 mg, 20 mg, Oral, BID, Howerter, Justin B, DO, 20 mg at 01/22/19 5361 .  feeding supplement (BOOST / RESOURCE BREEZE) liquid 1 Container, 1 Container, Oral, TID BM, Emokpae, Ejiroghene E, MD, 1 Container at 01/22/19 1007 .  feeding supplement (PRO-STAT SUGAR FREE 64) liquid 30 mL, 30 mL, Oral, BID, Emokpae, Courage, MD, 30 mL at 01/22/19 0821 .  guaiFENesin (MUCINEX) 12 hr tablet 600 mg, 600 mg, Oral, BID, Emokpae, Courage, MD, 600 mg at 01/22/19 0822 .  heparin injection 5,000 Units, 5,000  Units, Subcutaneous, Q8H, Emokpae, Courage, MD, 5,000 Units at 01/22/19 0653 .  HYDROmorphone (DILAUDID) injection 0.5 mg, 0.5 mg, Intravenous, Q4H PRN, Emokpae, Ejiroghene E, MD, 0.5 mg at 01/21/19 0930 .  ipratropium-albuterol (DUONEB) 0.5-2.5 (3) MG/3ML nebulizer solution 3 mL, 3 mL, Nebulization, TID, Emokpae, Courage, MD, 3 mL at 01/22/19 1339 .  multivitamin with minerals tablet 1 tablet, 1 tablet, Oral, Daily, Emokpae, Courage, MD, 1 tablet at 01/22/19 4431 .  ondansetron (ZOFRAN) tablet 4 mg, 4 mg, Oral, Q6H PRN **OR** ondansetron (ZOFRAN) injection 4 mg, 4 mg, Intravenous, Q6H PRN, Emokpae, Ejiroghene E, MD .  oxyCODONE (Oxy IR/ROXICODONE) immediate release tablet 5 mg, 5 mg, Oral, Q4H PRN, Denton Brick, Courage, MD, 5 mg at 01/22/19 0915 .  phenol (CHLORASEPTIC) mouth spray 1 spray, 1 spray, Mouth/Throat, PRN, Denton Brick, Courage, MD, 1 spray at 01/21/19 2139 .  potassium chloride SA (K-DUR,KLOR-CON) CR tablet 40 mEq, 40 mEq, Oral, Once, Emokpae, Courage, MD .  sodium chloride flush (NS) 0.9 % injection 3 mL, 3 mL, Intravenous, Once, Orlie Dakin, MD   Exam: Current vital signs: BP 112/72 (BP Location: Right Arm)   Pulse 69   Temp 97.8 F (36.6 C) (Oral)   Resp 20   Ht 5\' 6"  (1.676 m)   Wt 78.6 kg   LMP 02/09/2012   SpO2 99%   BMI 27.97 kg/m  Vital signs in last 24 hours: Temp:  [97.8 F (36.6 C)-98.9 F (37.2 C)] 97.8 F (36.6 C) (01/28 1301) Pulse Rate:  [56-69] 69 (01/28 1301) Resp:  [16-20] 20 (01/28 1301) BP: (112-126)/(72-79) 112/72 (01/28 1301) SpO2:  [99 %-100 %] 99 % (01/28 1340)  Physical Exam  Constitutional: Appears well-developed and well-nourished.  Psych: Affect appropriate to situation Eyes: No scleral injection HENT: No OP obstrucion Head: Normocephalic.  Cardiovascular: Normal rate and regular rhythm.  Respiratory: Effort normal, non-labored breathing GI: Soft.  No distension. There is no tenderness.  Skin: WDI  Neuro: Mental Status: Patient is  awake, alert, oriented to person, place, month, able to name objects, and situation. Patient is able to give a clear and coherent history. No signs of aphasia or neglect Cranial Nerves: II: Right hemianopsia III,IV, VI: EOMI without ptosis or diploplia. Pupils are equal, round, and reactive to light.   V: Facial sensation is symmetric to temperature VII: Facial movement is symmetric.  With resting right nasolabial fold decrease VIII: hearing is intact to voice X: Uvula elevates symmetrically XI: Shoulder shrug is symmetric. XII: tongue is midline without atrophy or fasciculations.  Motor: Tone is normal. Bulk is normal. 5/5 strength on the left with 4/5 on the right Sensory: Decrease sensation on the right arm and leg Deep Tendon Reflexes: No ankle jerk, no knee jerk however she has 2+ upper extremities bilaterally Plantars:  Toes are downgoing bilaterally.  Cerebellar: FNF normal  Labs I have reviewed labs in epic and the results pertinent to this consultation are:   CBC    Component Value Date/Time   WBC 8.5 01/22/2019 0427   RBC 3.81 (L) 01/22/2019 0427   HGB 10.8 (L) 01/22/2019 0427   HCT 33.6 (L) 01/22/2019 0427   PLT 472 (H) 01/22/2019 0427   MCV 88.2 01/22/2019 0427   MCH 28.3 01/22/2019 0427   MCHC 32.1 01/22/2019 0427   RDW 13.3 01/22/2019 0427   LYMPHSABS 1.5 02/21/2018 1545   MONOABS 0.3 02/21/2018 1545   EOSABS 0.0 02/21/2018 1545   BASOSABS 0.0 02/21/2018 1545    CMP     Component Value Date/Time   NA 141 01/22/2019 0427   K 2.7 (LL) 01/22/2019 0427   CL 108 01/22/2019 0427   CO2 24 01/22/2019 0427   GLUCOSE 123 (H) 01/22/2019 0427   BUN 8 01/22/2019 0427   CREATININE 0.61 01/22/2019 0427   CREATININE 0.94 02/08/2018 1209   CALCIUM 8.1 (L) 01/22/2019 0427   PROT 5.7 (L) 01/22/2019 0427   ALBUMIN 2.6 (L) 01/22/2019 0427   AST 11 (L) 01/22/2019 0427   ALT 11 01/22/2019 0427   ALKPHOS 53 01/22/2019 0427   BILITOT 0.2 (L) 01/22/2019 0427    GFRNONAA >60 01/22/2019 0427   GFRAA >60 01/22/2019 0427    Lipid Panel     Component Value Date/Time   CHOL 134 01/21/2019 0505   TRIG 106 01/21/2019 0505   HDL 30 (L) 01/21/2019 0505   CHOLHDL 4.5 01/21/2019 0505   VLDL 21 01/21/2019 0505   LDLCALC 83 01/21/2019 0505   LDLCALC 171 (H) 01/17/2018 1153     Imaging I have reviewed the images obtained:  CT-scan of the brain- acute/subacute infarction in the left parietal occipital junction region.  Minimal petechial blood products but no frank hematoma  MRI examination of the brain- acute/subacute nonhemorrhagic left occipital lobe infarct.  This may be a lateral PCA or watershed infarct.  Additional acute/subacute nonhemorrhagic left ACA/MCA watershed infarcts over the left convexity.  Subcortical white matter disease is mildly advanced for age.  MRA head- occlusion of the distal left vertebral artery, contrast in the left tibial artery proximal to the dural segment.  Limited time of flight signal suggests low or reversed flow.  Moderate distal right M1 segment stenosis.  Moderate distal left A1 segment stenosis.  Diffuse small vessel disease present on MRA circle of Willis.  Echocardiogram- 60% - 65% EF  A1c-5.7 LDL-83  Etta Quill PA-C [facility] [phone]  M-F  (9:00 am- 5:00 PM)  01/22/2019, 3:30 PM   Attending addendum Have seen and examined the patient. I have reviewed the chart personally. I have obtained the history from the patient personally. I have reviewed the images personally.  MRI of the brain shows acute to subacute him nonhemorrhagic left occipital lobe infarcts which might be lateral PCA watershed infarct and there are also acute subacute ACA MCA watershed infarcts over the left cerebral hemisphere.  Advanced white matter disease.  MRA of the head shows occlusion of the distal left vertebral artery and moderate distal right M1 and distal left A1 stenosis with diffuse small vessel disease  present on circle of Willis.  Assessment:  53 year old female with history of hypertension, hypercholesterolemia and smokes approximately half pack a day.  Presenting with 3-week history of nausea vomiting and gait instability.  MRI reveals acute/subacute ACA/MCA and also left occipital lobe infarct  likely watershed distribution.   No cardiac source to explain the symptoms.  No significant carotid disease to explain the pattern of strokes. The CT of the chest concerning for the mass needs to be explored further and if it is determined that that is not malignant, a TEE definitely should be done.   Impression -Acute ischemic stroke involving the left cerebral hemisphere-the pattern is suggestive of watershed distribution of strokes but there is no significant carotid or proximal stenosis/occlusion to explain the watershed-looking infarcts. -Consider hypercoagulability from a malignancy as a possibility since she has a?  Lung mass. -Blood cultures obtained yesterday unremarkable-less likely endocarditis -TEE might be useful.  Recommendations: -Continue aspirin and Lipitor.  Add Plavix 75 daily -Consider TEE-keep patient n.p.o. overnight. -Management of the lung mass per primary team. -PT/OT/speech therapy -Continue to monitor on telemetry   -- Amie Portland, MD [facility] Pager: [phone] If 7pm to 7am, please call on call as listed on AMION.

## 2019-01-22 NOTE — Progress Notes (Signed)
Patient Demographics:    Rita [name], is a 53 y.o. female, DOB - 26-Dec-1966, BZJ:[id number]  Admit date - 01/20/2019   Admitting Physician Ejiroghene Arlyce Dice, MD  Outpatient Primary MD for the patient is System, Pcp Not In  LOS - 1   Chief Complaint  Patient presents with  . Abdominal Pain        Subjective:    Rita [name] today has no fevers, no emesis, had chest pain earlier in the day, resolved now, no further diarrhea, abdominal pain on and off, cough and congestion with discolored sputum,   Assessment  & Plan :    Principal Problem:   Left sided cerebral hemisphere cerebrovascular accident (CVA) ([place]) Active Problems:   Tobacco use   Left subclavian artery occlusion   Gastroenteritis   Cavitating mass in right lower lung lobe (PNA Vs Cancer)  MRI/MRA Head and MRA Neck IMPRESSION: 1. Acute/subacute nonhemorrhagic left occipital lobe infarct. This may be a lateral PCA or watershed infarct. 2. Additional acute/subacute nonhemorrhagic left ACA/MCA watershed infarcts over the left convexity. 3. Subcortical white matter disease is mildly advanced for age. This likely reflects the sequela of chronic microvascular ischemia. 4. 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 5. Contrast in the left vertebral artery proximal to the dural segment. Limited time-of-flight signal suggests slow or reversed flow. 6. Moderate distal right M1 segment stenosis. 7. Moderate distal left A1 segment stenosis. 8. Diffuse small vessel disease present on the MRA circle-of-Willis. 9. Mild irregularity in the basilar artery and proximal PCA vessels bilaterally with asymmetric attenuation of distal branches, right greater than left.  Brief Summary Patient is a 53 year old smoker with history of PAD, Barrett's esophagus, chronic abdominal pain, chronic lower back pain, HTN,  chronic pancreatitis, HLD,  Depression , COPD admitted on 01/20/2019 with GI symptoms, however patient also complained of visual disturbance and gait disturbance so admitting physician did a neuro work-up which showed left hemispheric strokes, pt also found to have right lower lobe necrotic neoplasm Versus necrotic pneumonia/lung abscess... After discussing with neurologist Dr. Julius Bowels transfer to [facility] for TEE to rule out endocarditis with septic emboli, as well as for possible lung biopsy   Plan:- 1)Left Hemispheric strokes--- clinically from a neurological standpoint, she continues to have right hemianopsia, right nasolabial fold decrease and decrease sensation on the right.  MRI Brain shows watershed pattern involving left occipital lobe, left ACA/MCA watershed infarcts, as well as occlusion of left vertebral artery--- as per Dr. Erlinda Hong okay to continue aspirin hold off on Plavix in case patient needs additional procedures, continue Lipitor, TTE with preserved EF of 60 to 65% without wall motion abnormalities or intracardiac thrombus or endocarditis type findings, patient needs a TEE, patient will also need hypercoagulability work-up if TEE is negative.... Lower extremity venous Dopplers without acute DVT.  Patient may need vascular surgery consult... HDL is 30, LDL is 83, A1c is 5.7, TSH was 3.7 in January 2019  2)Rt LL PNA Vs Cancer--- CT chest with finding of  right lower lobe necrotic neoplasm Versus necrotic pneumonia/lung abscess, pulmonology consult from Dr. Luan Pulling appreciated, continue IV Unasyn started 01/20/19, patient may need  lung biopsy, blood cultures are NTD  3)Gastroenteritis--- Vomiting and diarrhea appears to have resolved  for now, stool for C. difficile and GI panel not sent as patient has no further loose stools  4)Tobacco abuse--- smoking cessation strongly advised  5) vascular disease--   MRA of the head shows occlusion of the distal left vertebral artery and moderate distal right M1 and  distal left A1 stenosis--- defer to stroke neurologist to determine if vascular surgery consult is required  Disposition/Need for in-Hospital Stay- patient unable to be discharged at this time due to acute stroke and right-sided pneumonia requiring further stroke work-up including TEE and IV antibiotics for pneumonia  Code Status : Full  Family Communication:   na   Disposition Plan  : Transfer to [facility] as outlined above for possible TEE, possible lung biopsy and for  stroke neurology consult, patient may need vascular surgery consult  Consults  :  Phone Consult with Dr Erlinda Hong--- Neurology  DVT Prophylaxis  :  Heparin - SCDs   Lab Results  Component Value Date   PLT 472 (H) 01/22/2019    Inpatient Medications  Scheduled Meds: . aspirin EC  81 mg Oral Daily  . atorvastatin  80 mg Oral q1800  . famotidine  20 mg Oral BID  . feeding supplement  1 Container Oral TID BM  . feeding supplement (PRO-STAT SUGAR FREE 64)  30 mL Oral BID  . guaiFENesin  600 mg Oral BID  . heparin injection (subcutaneous)  5,000 Units Subcutaneous Q8H  . ipratropium-albuterol  3 mL Nebulization BID  . multivitamin with minerals  1 tablet Oral Daily  . sodium chloride flush  3 mL Intravenous Once   Continuous Infusions: . ampicillin-sulbactam (UNASYN) IV 3 g (01/22/19 1635)   PRN Meds:.albuterol, alum & mag hydroxide-simeth, HYDROmorphone (DILAUDID) injection, ondansetron **OR** ondansetron (ZOFRAN) IV, oxyCODONE, phenol    Anti-infectives (From admission, onward)   Start     Dose/Rate Route Frequency Ordered Stop   01/20/19 1930  doxycycline (VIBRAMYCIN) 100 mg in sodium chloride 0.9 % 250 mL IVPB  Status:  Discontinued     100 mg 125 mL/hr over 120 Minutes Intravenous Every 12 hours 01/20/19 1915 01/21/19 1232   01/20/19 1930  Ampicillin-Sulbactam (UNASYN) 3 g in sodium chloride 0.9 % 100 mL IVPB     3 g 200 mL/hr over 30 Minutes Intravenous Every 6 hours 01/20/19 1925           Objective:   Vitals:   01/22/19 0820 01/22/19 1301 01/22/19 1340 01/22/19 1611  BP:  112/72  132/70  Pulse:  69  68  Resp:  20  17  Temp:  97.8 F (36.6 C)  98.4 F (36.9 C)  TempSrc:  Oral  Oral  SpO2: 100% 100% 99% 100%  Weight:      Height:        Wt Readings from Last 3 Encounters:  01/20/19 78.6 kg  03/19/18 84.3 kg  03/14/18 84.3 kg     Intake/Output Summary (Last 24 hours) at 01/22/2019 1825 Last data filed at 01/22/2019 7867 Gross per 24 hour  Intake 480 ml  Output -  Net 480 ml   Physical Exam Patient is examined daily including today on 01/22/2019 , exams remain the same as of yesterday except that has changed   Gen:- Awake Alert, no acute distress HEENT:- [place].AT, No sclera icterus Eyes--visual deficiencies in the right upper and to a lesser extent lower outer quadrants -right hemi-anopsia Neck-Supple Neck,No JVD,.  Lungs-fair air movement, few scattered rhonchi right lung  CV- S1, S2 normal,  regular 2/6 SM Abd-  +ve B.Sounds, Abd Soft, No tenderness,    Extremity/Skin:- No  edema, pedal pulses present  Psych-affect is appropriate, oriented x3 Neuro-right hemianopsia, right nasolabial fold decrease and decrease sensation on the right.,muscle strength 4/5 on the right,  subtle right-sided sensory deficits as well as subtle right-sided extremities sensory deficit   Data Review:   Micro Results Recent Results (from the past 240 hour(s))  Culture, blood (Routine X 2) w Reflex to ID Panel     Status: None (Preliminary result)   Collection Time: 01/21/19  3:26 PM  Result Value Ref Range Status   Specimen Description RIGHT ANTECUBITAL  Final   Special Requests   Final    BOTTLES DRAWN AEROBIC AND ANAEROBIC Blood Culture adequate volume   Culture   Final    NO GROWTH < 24 HOURS Performed at [facility], [address], [place], [place] [address]    Report Status PENDING  Incomplete    Radiology Reports Ct Head Wo Contrast  Result Date: 01/20/2019 CLINICAL  DATA:  Right-sided visual disturbance. EXAM: CT HEAD WITHOUT CONTRAST TECHNIQUE: Contiguous axial images were obtained from the base of the skull through the vertex without intravenous contrast. COMPARISON:  11/29/2009 FINDINGS: Brain: There is acute/subacute infarction in the left parieto-occipital junction region. There may be minimal petechial blood products but there is no frank hematoma. Mild swelling but no significant mass effect or shift. The remainder the brain appears negative. Vascular: No significant vascular finding. Ordinary atherosclerotic calcification in the carotid siphon regions. No finding on this noncontrast study to suggest venous thrombosis. Skull: Normal Sinuses/Orbits: Clear/normal Other: None IMPRESSION: Acute/subacute infarction in the left parieto-occipital junction region. Minimal petechial blood products but no frank hematoma. Mild swelling but no significant mass effect or shift. Electronically Signed   By: Nelson Chimes M.D.   On: 01/20/2019 19:15   Ct Chest W Contrast  Result Date: 01/20/2019 CLINICAL DATA:  Nausea and vomiting with pain, initial encounter EXAM: CT CHEST, ABDOMEN, AND PELVIS WITH CONTRAST TECHNIQUE: Multidetector CT imaging of the chest, abdomen and pelvis was performed following the standard protocol during bolus administration of intravenous contrast. CONTRAST:  145mL ISOVUE-300 IOPAMIDOL (ISOVUE-300) INJECTION 61% COMPARISON:  Plain film from earlier in the same day as well as CT from 02/21/2018 FINDINGS: CT CHEST FINDINGS Cardiovascular: Thoracic aorta is within normal limits. No cardiac enlargement is seen. The pulmonary artery as visualized is within normal limits. 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 Opacification of the more distal subclavian artery is noted secondary to a left carotid to subclavian bypass. Mediastinum/Nodes: Thoracic inlet is within normal limits.The esophagus demonstrates  some diffuse wall thickening likely related to underlying reflux. The overall appearance is stable from the prior exam. No left hilar adenopathy is noted. No significant mediastinal adenopathy is noted. Scattered small right hilar lymph nodes are seen likely reactive in nature. Lungs/Pleura: Lungs are well aerated bilaterally. A few scattered calcified granulomas are seen. In the superior segment of the right lower lobe, there is a focal 3.8 x 4.3 cm mass lesion with central decreased attenuation identified consistent with fluid. Some suggestion of air within the central portion of the lesion is noted on the coronal and sagittal reconstructions . The lungs are otherwise clear. No sizable effusion is noted. Musculoskeletal: No acute bony abnormality is noted. CT ABDOMEN PELVIS FINDINGS Hepatobiliary: Fatty infiltration of the liver is noted. The gallbladder has been surgically removed. Pancreas:  Unremarkable. No pancreatic ductal dilatation or surrounding inflammatory changes. Spleen: Normal in size without focal abnormality. Adrenals/Urinary Tract: Adrenal glands are within normal limits. Kidneys are well visualized bilaterally without renal calculi or obstructive changes. Ureters are within normal limits. The bladder is partially distended. Stomach/Bowel: The appendix is within normal limits. No obstructive or inflammatory changes of the bowel are seen. Small sliding-type hiatal hernia is noted as well as the aforementioned esophageal thickening distally consistent with reflux disease. Vascular/Lymphatic: Aortic atherosclerosis. No enlarged abdominal or pelvic lymph nodes. Reproductive: Uterus and bilateral adnexa are unremarkable. Other: No abdominal wall hernia or abnormality. No abdominopelvic ascites. Musculoskeletal: Degenerative changes of the lumbar spine are noted. IMPRESSION: Rounded masslike density in the superior segment of the right lower lobe as described. There is central decreased attenuation as  well as a few foci of what appears to be air within on the sagittal and coronal reconstructions. Although this could represent a necrotic neoplasm, short-term treatment for necrotic pneumonia/lung abscess is recommended with repeat imaging to assess for resolution. If the lesion persists on subsequent imaging more aggressive workup can be performed. Chronic changes as described above. Electronically Signed   By: Inez Catalina M.D.   On: 01/20/2019 15:56   Mr Jodene Nam Head Wo Contrast  Result Date: 01/21/2019 CLINICAL DATA:  Headache and weakness. Abnormal CT scan. Visual field deficits for 1 week. Gait abnormalities. Headaches. EXAM: MRI HEAD WITHOUT CONTRAST MRA HEAD WITHOUT CONTRAST MRA NECK WITHOUT AND WITH CONTRAST TECHNIQUE: Multiplanar, multiecho pulse sequences of the brain and surrounding structures were obtained without contrast. Angiographic images of the Circle of Willis were obtained using MRA technique without intravenous contrast. Angiographic images of the neck were obtained using MRA technique without and with intravenous contrast. Carotid stenosis measurements (when applicable) are obtained utilizing NASCET criteria, using the distal internal carotid diameter as the denominator. CONTRAST:  7 mL Gadavist COMPARISON:  CT head without contrast 01/20/2019 FINDINGS: MRI HEAD FINDINGS Brain: A left MCA/PCA acute/subacute watershed infarct is confirmed. Additional foci of restricted diffusion extend over the convexity in the left MCA/ACA watershed territory. There is minimal, if any, involvement of the precentral gyrus. T2 signal changes are associated with the areas of acute/subacute infarction, compatible with a time frame over the last week. Minimal subcortical T2 changes are present in the right frontal lobe and more anterior left frontal lobe. Basal ganglia are intact. Insert normal IAC's The brainstem and cerebellum are within normal limits. Vascular: Flow is present in the major intracranial arteries.  Skull and upper cervical spine: The craniocervical junction is normal. Upper cervical spine is within normal limits. Marrow signal is unremarkable. Sinuses/Orbits: Mild mucosal thickening is present within the ethmoid air cells. The paranasal sinuses and mastoid air cells are otherwise clear. MRA HEAD FINDINGS Atherosclerotic irregularity is present within the cavernous internal carotid arteries bilaterally. There is a moderate stenosis in the distal left A1 segment. The right A1 segments is normal. The left M1 segment is normal. There is a moderate stenosis of the distal right M1 segment with significant signal loss. Distal small vessel disease is present in the MCA distributions bilaterally. No other significant proximal disease is present. There is no flow in the vertebral artery apart from minimal signal is proximal to the vertebrobasilar junction. There is a moderate stenosis of the residual right vertebral artery at the PICA. Irregularity is present throughout the basilar artery without a significant stenosis. There is mild irregularity of the proximal PCA vessels bilaterally without significant stenosis. Right greater  than left distal PCA branch vessel attenuation is present. MRA NECK FINDINGS The time-of-flight images demonstrate no significant flow disturbance at either carotid bifurcation. There is no antegrade flow signal in the left vertebral artery. The right common carotid artery is within normal limits. There is moderate stenosis at the right external carotid artery. No significant stenosis is present in the right ICA through the ICA terminus. The left common carotid artery is within normal limits. Bifurcation is unremarkable. There is no significant stenosis in the left ICA through the terminus. There is moderate narrowing at the origin of the dominant right vertebral artery. Additional narrowing is present at the dural margin of the right vertebral artery. Distal left vertebral artery occlusion is  present. There is some signal in the left vertebral artery to the level of the dura. IMPRESSION: 1. Acute/subacute nonhemorrhagic left occipital lobe infarct. This may be a lateral PCA or watershed infarct. 2. Additional acute/subacute nonhemorrhagic left ACA/MCA watershed infarcts over the left convexity. 3. Subcortical white matter disease is mildly advanced for age. This likely reflects the sequela of chronic microvascular ischemia. 4. 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 5. Contrast in the left vertebral artery proximal to the dural segment. Limited time-of-flight signal suggests slow or reversed flow. 6. Moderate distal right M1 segment stenosis. 7. Moderate distal left A1 segment stenosis. 8. Diffuse small vessel disease present on the MRA circle-of-Willis. 9. Mild irregularity in the basilar artery and proximal PCA vessels bilaterally with asymmetric attenuation of distal branches, right greater than left. Electronically Signed   By: San Morelle M.D.   On: 01/21/2019 09:07   Mr Jodene Nam Neck W Wo Contrast  Result Date: 01/21/2019 CLINICAL DATA:  Headache and weakness. Abnormal CT scan. Visual field deficits for 1 week. Gait abnormalities. Headaches. EXAM: MRI HEAD WITHOUT CONTRAST MRA HEAD WITHOUT CONTRAST MRA NECK WITHOUT AND WITH CONTRAST TECHNIQUE: Multiplanar, multiecho pulse sequences of the brain and surrounding structures were obtained without contrast. Angiographic images of the Circle of Willis were obtained using MRA technique without intravenous contrast. Angiographic images of the neck were obtained using MRA technique without and with intravenous contrast. Carotid stenosis measurements (when applicable) are obtained utilizing NASCET criteria, using the distal internal carotid diameter as the denominator. CONTRAST:  7 mL Gadavist COMPARISON:  CT head without contrast 01/20/2019 FINDINGS: MRI HEAD FINDINGS Brain: A left MCA/PCA acute/subacute watershed infarct is confirmed.  Additional foci of restricted diffusion extend over the convexity in the left MCA/ACA watershed territory. There is minimal, if any, involvement of the precentral gyrus. T2 signal changes are associated with the areas of acute/subacute infarction, compatible with a time frame over the last week. Minimal subcortical T2 changes are present in the right frontal lobe and more anterior left frontal lobe. Basal ganglia are intact. Insert normal IAC's The brainstem and cerebellum are within normal limits. Vascular: Flow is present in the major intracranial arteries. Skull and upper cervical spine: The craniocervical junction is normal. Upper cervical spine is within normal limits. Marrow signal is unremarkable. Sinuses/Orbits: Mild mucosal thickening is present within the ethmoid air cells. The paranasal sinuses and mastoid air cells are otherwise clear. MRA HEAD FINDINGS Atherosclerotic irregularity is present within the cavernous internal carotid arteries bilaterally. There is a moderate stenosis in the distal left A1 segment. The right A1 segments is normal. The left M1 segment is normal. There is a moderate stenosis of the distal right M1 segment with significant signal loss. Distal small vessel disease is present in the MCA  distributions bilaterally. No other significant proximal disease is present. There is no flow in the vertebral artery apart from minimal signal is proximal to the vertebrobasilar junction. There is a moderate stenosis of the residual right vertebral artery at the PICA. Irregularity is present throughout the basilar artery without a significant stenosis. There is mild irregularity of the proximal PCA vessels bilaterally without significant stenosis. Right greater than left distal PCA branch vessel attenuation is present. MRA NECK FINDINGS The time-of-flight images demonstrate no significant flow disturbance at either carotid bifurcation. There is no antegrade flow signal in the left vertebral  artery. The right common carotid artery is within normal limits. There is moderate stenosis at the right external carotid artery. No significant stenosis is present in the right ICA through the ICA terminus. The left common carotid artery is within normal limits. Bifurcation is unremarkable. There is no significant stenosis in the left ICA through the terminus. There is moderate narrowing at the origin of the dominant right vertebral artery. Additional narrowing is present at the dural margin of the right vertebral artery. Distal left vertebral artery occlusion is present. There is some signal in the left vertebral artery to the level of the dura. IMPRESSION: 1. Acute/subacute nonhemorrhagic left occipital lobe infarct. This may be a lateral PCA or watershed infarct. 2. Additional acute/subacute nonhemorrhagic left ACA/MCA watershed infarcts over the left convexity. 3. Subcortical white matter disease is mildly advanced for age. This likely reflects the sequela of chronic microvascular ischemia. 4. 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 5. Contrast in the left vertebral artery proximal to the dural segment. Limited time-of-flight signal suggests slow or reversed flow. 6. Moderate distal right M1 segment stenosis. 7. Moderate distal left A1 segment stenosis. 8. Diffuse small vessel disease present on the MRA circle-of-Willis. 9. Mild irregularity in the basilar artery and proximal PCA vessels bilaterally with asymmetric attenuation of distal branches, right greater than left. Electronically Signed   By: San Morelle M.D.   On: 01/21/2019 09:07   Mr Brain Wo Contrast  Result Date: 01/21/2019 CLINICAL DATA:  Headache and weakness. Abnormal CT scan. Visual field deficits for 1 week. Gait abnormalities. Headaches. EXAM: MRI HEAD WITHOUT CONTRAST MRA HEAD WITHOUT CONTRAST MRA NECK WITHOUT AND WITH CONTRAST TECHNIQUE: Multiplanar, multiecho pulse sequences of the brain and surrounding structures were  obtained without contrast. Angiographic images of the Circle of Willis were obtained using MRA technique without intravenous contrast. Angiographic images of the neck were obtained using MRA technique without and with intravenous contrast. Carotid stenosis measurements (when applicable) are obtained utilizing NASCET criteria, using the distal internal carotid diameter as the denominator. CONTRAST:  7 mL Gadavist COMPARISON:  CT head without contrast 01/20/2019 FINDINGS: MRI HEAD FINDINGS Brain: A left MCA/PCA acute/subacute watershed infarct is confirmed. Additional foci of restricted diffusion extend over the convexity in the left MCA/ACA watershed territory. There is minimal, if any, involvement of the precentral gyrus. T2 signal changes are associated with the areas of acute/subacute infarction, compatible with a time frame over the last week. Minimal subcortical T2 changes are present in the right frontal lobe and more anterior left frontal lobe. Basal ganglia are intact. Insert normal IAC's The brainstem and cerebellum are within normal limits. Vascular: Flow is present in the major intracranial arteries. Skull and upper cervical spine: The craniocervical junction is normal. Upper cervical spine is within normal limits. Marrow signal is unremarkable. Sinuses/Orbits: Mild mucosal thickening is present within the ethmoid air cells. The paranasal sinuses and mastoid air  cells are otherwise clear. MRA HEAD FINDINGS Atherosclerotic irregularity is present within the cavernous internal carotid arteries bilaterally. There is a moderate stenosis in the distal left A1 segment. The right A1 segments is normal. The left M1 segment is normal. There is a moderate stenosis of the distal right M1 segment with significant signal loss. Distal small vessel disease is present in the MCA distributions bilaterally. No other significant proximal disease is present. There is no flow in the vertebral artery apart from minimal signal  is proximal to the vertebrobasilar junction. There is a moderate stenosis of the residual right vertebral artery at the PICA. Irregularity is present throughout the basilar artery without a significant stenosis. There is mild irregularity of the proximal PCA vessels bilaterally without significant stenosis. Right greater than left distal PCA branch vessel attenuation is present. MRA NECK FINDINGS The time-of-flight images demonstrate no significant flow disturbance at either carotid bifurcation. There is no antegrade flow signal in the left vertebral artery. The right common carotid artery is within normal limits. There is moderate stenosis at the right external carotid artery. No significant stenosis is present in the right ICA through the ICA terminus. The left common carotid artery is within normal limits. Bifurcation is unremarkable. There is no significant stenosis in the left ICA through the terminus. There is moderate narrowing at the origin of the dominant right vertebral artery. Additional narrowing is present at the dural margin of the right vertebral artery. Distal left vertebral artery occlusion is present. There is some signal in the left vertebral artery to the level of the dura. IMPRESSION: 1. Acute/subacute nonhemorrhagic left occipital lobe infarct. This may be a lateral PCA or watershed infarct. 2. Additional acute/subacute nonhemorrhagic left ACA/MCA watershed infarcts over the left convexity. 3. Subcortical white matter disease is mildly advanced for age. This likely reflects the sequela of chronic microvascular ischemia. 4. 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 5. Contrast in the left vertebral artery proximal to the dural segment. Limited time-of-flight signal suggests slow or reversed flow. 6. Moderate distal right M1 segment stenosis. 7. Moderate distal left A1 segment stenosis. 8. Diffuse small vessel disease present on the MRA circle-of-Willis. 9. Mild irregularity in the basilar  artery and proximal PCA vessels bilaterally with asymmetric attenuation of distal branches, right greater than left. Electronically Signed   By: San Morelle M.D.   On: 01/21/2019 09:07   Ct Abdomen Pelvis W Contrast  Result Date: 01/20/2019 CLINICAL DATA:  Nausea and vomiting with pain, initial encounter EXAM: CT CHEST, ABDOMEN, AND PELVIS WITH CONTRAST TECHNIQUE: Multidetector CT imaging of the chest, abdomen and pelvis was performed following the standard protocol during bolus administration of intravenous contrast. CONTRAST:  152mL ISOVUE-300 IOPAMIDOL (ISOVUE-300) INJECTION 61% COMPARISON:  Plain film from earlier in the same day as well as CT from 02/21/2018 FINDINGS: CT CHEST FINDINGS Cardiovascular: Thoracic aorta is within normal limits. No cardiac enlargement is seen. The pulmonary artery as visualized is within normal limits. 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 Opacification of the more distal subclavian artery is noted secondary to a left carotid to subclavian bypass. Mediastinum/Nodes: Thoracic inlet is within normal limits.The esophagus demonstrates some diffuse wall thickening likely related to underlying reflux. The overall appearance is stable from the prior exam. No left hilar adenopathy is noted. No significant mediastinal adenopathy is noted. Scattered small right hilar lymph nodes are seen likely reactive in nature. Lungs/Pleura: Lungs are well aerated bilaterally. A  few scattered calcified granulomas are seen. In the superior segment of the right lower lobe, there is a focal 3.8 x 4.3 cm mass lesion with central decreased attenuation identified consistent with fluid. Some suggestion of air within the central portion of the lesion is noted on the coronal and sagittal reconstructions . The lungs are otherwise clear. No sizable effusion is noted. Musculoskeletal: No acute bony abnormality is noted. CT ABDOMEN PELVIS  FINDINGS Hepatobiliary: Fatty infiltration of the liver is noted. The gallbladder has been surgically removed. Pancreas: Unremarkable. No pancreatic ductal dilatation or surrounding inflammatory changes. Spleen: Normal in size without focal abnormality. Adrenals/Urinary Tract: Adrenal glands are within normal limits. Kidneys are well visualized bilaterally without renal calculi or obstructive changes. Ureters are within normal limits. The bladder is partially distended. Stomach/Bowel: The appendix is within normal limits. No obstructive or inflammatory changes of the bowel are seen. Small sliding-type hiatal hernia is noted as well as the aforementioned esophageal thickening distally consistent with reflux disease. Vascular/Lymphatic: Aortic atherosclerosis. No enlarged abdominal or pelvic lymph nodes. Reproductive: Uterus and bilateral adnexa are unremarkable. Other: No abdominal wall hernia or abnormality. No abdominopelvic ascites. Musculoskeletal: Degenerative changes of the lumbar spine are noted. IMPRESSION: Rounded masslike density in the superior segment of the right lower lobe as described. There is central decreased attenuation as well as a few foci of what appears to be air within on the sagittal and coronal reconstructions. Although this could represent a necrotic neoplasm, short-term treatment for necrotic pneumonia/lung abscess is recommended with repeat imaging to assess for resolution. If the lesion persists on subsequent imaging more aggressive workup can be performed. Chronic changes as described above. Electronically Signed   By: Inez Catalina M.D.   On: 01/20/2019 15:56   US Venous Img Lower Bilateral  Result Date: 01/21/2019 CLINICAL DATA:  Bilateral lower extremity pain and edema for the past 3 years. History of pulmonary embolism. Evaluate for acute or chronic DVT. EXAM: BILATERAL LOWER EXTREMITY VENOUS DOPPLER ULTRASOUND TECHNIQUE: Gray-scale sonography with graded compression, as well as  color Doppler and duplex ultrasound were performed to evaluate the lower extremity deep venous systems from the level of the common femoral vein and including the common femoral, femoral, profunda femoral, popliteal and calf veins including the posterior tibial, peroneal and gastrocnemius veins when visible. The superficial great saphenous vein was also interrogated. Spectral Doppler was utilized to evaluate flow at rest and with distal augmentation maneuvers in the common femoral, femoral and popliteal veins. COMPARISON:  None. FINDINGS: RIGHT LOWER EXTREMITY Common Femoral Vein: No evidence of thrombus. Normal compressibility, respiratory phasicity and response to augmentation. Saphenofemoral Junction: No evidence of thrombus. Normal compressibility and flow on color Doppler imaging. Profunda Femoral Vein: No evidence of thrombus. Normal compressibility and flow on color Doppler imaging. Femoral Vein: No evidence of thrombus. Normal compressibility, respiratory phasicity and response to augmentation. Popliteal Vein: No evidence of thrombus. Normal compressibility, respiratory phasicity and response to augmentation. Calf Veins: No evidence of thrombus. Normal compressibility and flow on color Doppler imaging. Superficial Great Saphenous Vein: No evidence of thrombus. Normal compressibility. Venous Reflux:  None. Other Findings: Minimal amount of eccentric echogenic plaque is seen within the right common femoral artery (image 2 and 3). LEFT LOWER EXTREMITY Common Femoral Vein: No evidence of thrombus. Normal compressibility, respiratory phasicity and response to augmentation. Saphenofemoral Junction: No evidence of thrombus. Normal compressibility and flow on color Doppler imaging. Profunda Femoral Vein: No evidence of thrombus. Normal compressibility and flow on color Doppler imaging.  Femoral Vein: No evidence of thrombus. Normal compressibility, respiratory phasicity and response to augmentation. Popliteal Vein:  No evidence of thrombus. Normal compressibility, respiratory phasicity and response to augmentation. Calf Veins: No evidence of thrombus. Normal compressibility and flow on color Doppler imaging. Superficial Great Saphenous Vein: No evidence of thrombus. Normal compressibility. Venous Reflux:  None. Other Findings:  None. IMPRESSION: No evidence acute or chronic DVT within either lower extremity. Electronically Signed   By: Sandi Mariscal M.D.   On: 01/21/2019 15:08   Dg Abd Acute 2+v W 1v Chest  Result Date: 01/20/2019 CLINICAL DATA:  Abdominal pain, nausea, vomiting, severe watery diarrhea, fevers and decreased urinary output. Smoker. EXAM: DG ABDOMEN ACUTE W/ 1V CHEST COMPARISON:  Portable chest dated 03/19/2018, chest CTA dated 02/21/2018 and abdomen and pelvis CT dated 07/19/2017. FINDINGS: Normal sized heart. Interval 4.2 cm rounded mass-like density with poorly defined margins in the right mid lung on the frontal view of the chest. Moderate diffuse peribronchial thickening and accentuation of the interstitial markings with mild progression. Normal bowel gas pattern without free peritoneal air. Cholecystectomy clips. Minimal lumbar spine degenerative changes. IMPRESSION: 1. Interval 4.2 cm rounded mass-like density in the right mid lung. This is concerning for a primary lung neoplasm. A rounded area of pneumonia is less likely. Further evaluation with a chest CT with contrast is recommended. 2. Progressive bronchitic changes. 3. No acute abdominal abnormality. Electronically Signed   By: Claudie Revering M.D.   On: 01/20/2019 13:18   [place] Ekg Site Rite  Result Date: 01/22/2019 If Site Rite image not attached, placement could not be confirmed due to current cardiac rhythm.    CBC Recent Labs  Lab 01/20/19 1021 01/22/19 0427  WBC 11.3* 8.5  HGB 15.5* 10.8*  HCT 46.4* 33.6*  PLT 570* 472*  MCV 83.3 88.2  MCH 27.8 28.3  MCHC 33.4 32.1  RDW 13.1 13.3    Chemistries  Recent Labs  Lab  01/20/19 1021 01/21/19 0505 01/22/19 0427  NA 137 140 141  K 2.9* 3.0* 2.7*  CL 96* 107 108  CO2 25 26 24   GLUCOSE 167* 97 123*  BUN 12 8 8   CREATININE 0.74 0.64 0.61  CALCIUM 9.3 8.2* 8.1*  MG 1.9  --   --   AST 18  --  11*  ALT 17  --  11  ALKPHOS 94  --  53  BILITOT 0.8  --  0.2*   ------------------------------------------------------------------------------------------------------------------ Recent Labs    01/21/19 0505  CHOL 134  HDL 30*  LDLCALC 83  TRIG 106  CHOLHDL 4.5    Lab Results  Component Value Date   HGBA1C 5.7 (H) 01/21/2019   ------------------------------------------------------------------------------------------------------------------ No results for input(s): TSH, T4TOTAL, T3FREE, THYROIDAB in the last 72 hours.  Invalid input(s): FREET3 ------------------------------------------------------------------------------------------------------------------ No results for input(s): VITAMINB12, FOLATE, FERRITIN, TIBC, IRON, RETICCTPCT in the last 72 hours.  Coagulation profile No results for input(s): INR, PROTIME in the last 168 hours.  No results for input(s): DDIMER in the last 72 hours.  Cardiac Enzymes No results for input(s): CKMB, TROPONINI, MYOGLOBIN in the last 168 hours.  Invalid input(s): CK ------------------------------------------------------------------------------------------------------------------    Component Value Date/Time   BNP 15.0 02/06/2015 0335     Roxan Hockey M.D on 01/22/2019 at 6:25 PM  Go to [email or web address] - for contact info  [facility] - Office  [phone]

## 2019-01-22 NOTE — Progress Notes (Signed)
She continues to have multiple issues.  She lost IV access.  She still has abnormalities on her chest x-ray pneumonia versus cancer.  She has substantial vascular disease in her brain demonstrated on the MRI.  There is some concern that she may have had embolic phenomenon and she is going to be set up for a transesophageal echocardiogram.  Plans are for her to go to [facility] for TEE neurology evaluation potential vascular surgery evaluation.  She may need biopsy of the lung lesion versus continue to treat her for pneumonia with early follow-up.  Discussed with patient at bedside and she understands  Will of course plan to sign off since she is being moved

## 2019-01-22 NOTE — Progress Notes (Signed)
Report called to [facility] at [place].  Discharge instructions reviewed with patient . [facility] called several hours ago.

## 2019-01-22 NOTE — Progress Notes (Signed)
RN is with patient and central monitor called that patient had 7 beats run of V.tach.  Patient is asymtomatic.

## 2019-01-23 ENCOUNTER — Encounter (HOSPITAL_COMMUNITY)
Admission: EM | Disposition: A | Discharge status: Home / Self Care | Payer: Self-pay | Source: Home / Self Care | Attending: Family Medicine

## 2019-01-23 ENCOUNTER — Inpatient Hospital Stay (HOSPITAL_COMMUNITY): Payer: Medicare Other | Admitting: Certified Registered Nurse Anesthetist

## 2019-01-23 ENCOUNTER — Inpatient Hospital Stay (HOSPITAL_COMMUNITY): Payer: Medicare Other

## 2019-01-23 DIAGNOSIS — I639 Cerebral infarction, unspecified: Secondary | ICD-10-CM

## 2019-01-23 DIAGNOSIS — Z720 Tobacco use: Secondary | ICD-10-CM

## 2019-01-23 DIAGNOSIS — I6389 Other cerebral infarction: Secondary | ICD-10-CM

## 2019-01-23 DIAGNOSIS — I708 Atherosclerosis of other arteries: Secondary | ICD-10-CM

## 2019-01-23 DIAGNOSIS — J984 Other disorders of lung: Secondary | ICD-10-CM

## 2019-01-23 DIAGNOSIS — I340 Nonrheumatic mitral (valve) insufficiency: Secondary | ICD-10-CM

## 2019-01-23 HISTORY — PX: TEE WITHOUT CARDIOVERSION: SHX5443

## 2019-01-23 LAB — RAPID URINE DRUG SCREEN, HOSP PERFORMED
Amphetamines: NOT DETECTED
Barbiturates: NOT DETECTED
Benzodiazepines: NOT DETECTED
Cocaine: NOT DETECTED
Opiates: POSITIVE — AB
Tetrahydrocannabinol: POSITIVE — AB

## 2019-01-23 LAB — CBC
HCT: 35.6 % — ABNORMAL LOW (ref 36.0–46.0)
Hemoglobin: 11.9 g/dL — ABNORMAL LOW (ref 12.0–15.0)
MCH: 29 pg (ref 26.0–34.0)
MCHC: 33.4 g/dL (ref 30.0–36.0)
MCV: 86.8 fL (ref 80.0–100.0)
PLATELETS: 501 10*3/uL — AB (ref 150–400)
RBC: 4.1 MIL/uL (ref 3.87–5.11)
RDW: 13.3 % (ref 11.5–15.5)
WBC: 9.7 10*3/uL (ref 4.0–10.5)
nRBC: 0 % (ref 0.0–0.2)

## 2019-01-23 LAB — BASIC METABOLIC PANEL
Anion gap: 11 (ref 5–15)
BUN: 11 mg/dL (ref 6–20)
CO2: 24 mmol/L (ref 22–32)
Calcium: 8.7 mg/dL — ABNORMAL LOW (ref 8.9–10.3)
Chloride: 106 mmol/L (ref 98–111)
Creatinine, Ser: 0.81 mg/dL (ref 0.44–1.00)
GFR calc Af Amer: >60 mL/min (ref >60)
GFR calc non Af Amer: >60 mL/min (ref >60)
GLUCOSE: 101 mg/dL — AB (ref 70–99)
POTASSIUM: 3.3 mmol/L — AB (ref 3.5–5.1)
Sodium: 141 mmol/L (ref 135–145)

## 2019-01-23 SURGERY — ECHOCARDIOGRAM, TRANSESOPHAGEAL
Anesthesia: Monitor Anesthesia Care

## 2019-01-23 MED ORDER — SODIUM CHLORIDE (PF) 0.9 % IJ SOLN
INTRAMUSCULAR | Status: DC | PRN
  Administered 2019-01-23: 9 mL via INTRAVENOUS

## 2019-01-23 MED ORDER — LACTATED RINGERS IV SOLN
INTRAVENOUS | Status: DC
  Administered 2019-01-23: 13:00:00 via INTRAVENOUS

## 2019-01-23 MED ORDER — PROPOFOL 10 MG/ML IV BOLUS
INTRAVENOUS | Status: DC | PRN
  Administered 2019-01-23: 30 mg via INTRAVENOUS
  Administered 2019-01-23: 40 mg via INTRAVENOUS
  Administered 2019-01-23: 30 mg via INTRAVENOUS

## 2019-01-23 MED ORDER — PHENYLEPHRINE 40 MCG/ML (10ML) SYRINGE FOR IV PUSH (FOR BLOOD PRESSURE SUPPORT): PREFILLED_SYRINGE | INTRAVENOUS | Status: DC | PRN

## 2019-01-23 MED ORDER — SODIUM CHLORIDE 0.9 % IV SOLN: INTRAVENOUS | Status: DC

## 2019-01-23 MED ORDER — SODIUM CHLORIDE 0.9 % IV SOLN
3.0000 g | Freq: Four times a day (QID) | INTRAVENOUS | Status: DC
  Administered 2019-01-23 – 2019-01-24 (×4): 3 g via INTRAVENOUS
  Filled 2019-01-23 (×6): qty 3

## 2019-01-23 MED ORDER — SODIUM CHLORIDE 0.9% FLUSH: 10.0000 mL | INTRAVENOUS | Status: DC | PRN

## 2019-01-23 MED ORDER — SACCHAROMYCES BOULARDII 250 MG PO CAPS
250.0000 mg | ORAL_CAPSULE | Freq: Two times a day (BID) | ORAL | Status: DC
  Administered 2019-01-23 – 2019-01-24 (×3): 250 mg via ORAL
  Filled 2019-01-23 (×3): qty 1

## 2019-01-23 MED ORDER — PHENYLEPHRINE 40 MCG/ML (10ML) SYRINGE FOR IV PUSH (FOR BLOOD PRESSURE SUPPORT)
PREFILLED_SYRINGE | INTRAVENOUS | Status: DC | PRN
  Administered 2019-01-23: 80 ug via INTRAVENOUS

## 2019-01-23 MED ORDER — BUPRENORPHINE HCL-NALOXONE HCL 8-2 MG SL SUBL
1.0000 | SUBLINGUAL_TABLET | Freq: Every day | SUBLINGUAL | Status: DC
  Administered 2019-01-23 – 2019-01-24 (×2): 1 via SUBLINGUAL
  Filled 2019-01-23 (×2): qty 1

## 2019-01-23 MED ORDER — PROPOFOL 500 MG/50ML IV EMUL
INTRAVENOUS | Status: DC | PRN
  Administered 2019-01-23: 100 ug/kg/min via INTRAVENOUS

## 2019-01-23 MED ORDER — SODIUM CHLORIDE 0.9% FLUSH
10.0000 mL | Freq: Two times a day (BID) | INTRAVENOUS | Status: DC
  Administered 2019-01-23 – 2019-01-24 (×3): 10 mL

## 2019-01-23 MED ORDER — LIDOCAINE 2% (20 MG/ML) 5 ML SYRINGE
INTRAMUSCULAR | Status: DC | PRN
  Administered 2019-01-23: 100 mg via INTRAVENOUS

## 2019-01-23 MED ORDER — IPRATROPIUM-ALBUTEROL 0.5-2.5 (3) MG/3ML IN SOLN: 3.0000 mL | Freq: Four times a day (QID) | RESPIRATORY_TRACT | Status: DC | PRN

## 2019-01-23 MED ORDER — STROKE: EARLY STAGES OF RECOVERY BOOK
Freq: Once | Status: AC
  Administered 2019-01-23: 16:00:00
  Filled 2019-01-23: qty 1

## 2019-01-23 MED ORDER — CLOPIDOGREL BISULFATE 75 MG PO TABS
75.0000 mg | ORAL_TABLET | Freq: Every day | ORAL | Status: DC
  Administered 2019-01-23 – 2019-01-24 (×2): 75 mg via ORAL
  Filled 2019-01-23 (×2): qty 1

## 2019-01-23 NOTE — Progress Notes (Signed)
Peripherally Inserted Central Catheter/Midline Placement  The IV Nurse has discussed with the patient and/or persons authorized to consent for the patient, the purpose of this procedure and the potential benefits and risks involved with this procedure.  The benefits include less needle sticks, lab draws from the catheter, and the patient may be discharged home with the catheter. Risks include, but not limited to, infection, bleeding, blood clot (thrombus formation), and puncture of an artery; nerve damage and irregular heartbeat and possibility to perform a PICC exchange if needed/ordered by physician.  Alternatives to this procedure were also discussed.  Bard Power PICC patient education guide, fact sheet on infection prevention and patient information card has been provided to patient /or left at bedside.    PICC/Midline Placement Documentation        Rita [name] 01/23/2019, 10:09 AM

## 2019-01-23 NOTE — Progress Notes (Signed)
PROGRESS NOTE    Rita [name]  MQK:[id number] DOB: Aug 27, 1966 DOA: 01/20/2019 PCP: System, Pcp Not In      Brief Narrative:  Rita [name] is a 53 y.o. F with hx PVD s/p carotid-subclavian L bypass last year, chronic pain, chronic pancreatitis, depression and COPD who presented with 3 weeks vomiting, diarrhea, found incidentally to have right lower lobe mass and right sided paresthesias and hemianopsia.  Evaluated by Pulmonology at [facility], then transferred to [facility] for stroke work up and TEE.      Assessment & Plan:  Acute watershed distribution strokes -Non-invasive angiography showed moderate intracranial atherosclerosis -Echocardiogram showed no cardiogenic source of embolism -Carotid imaging unremarkable   -Lipids ordered: continue Lipitor -Aspirin and Plavix continued on admission -Atrial fibrillation: not present -tPA not given because outside window -Dysphagia screen ordered in ER -PT eval ordered -Smoking cessation: recommended   Right lower lobe lung mass Discussed with Dr. Luan Pulling.   -Continue Unasyn -Start probiotic -Will need 6 weeks antibiotics then repeat imaging  Gastroenteritis Nausea and vomiting completely resolved.  Chronic pain -Continue Suboxone  Moderate protein calorie malnutrition -Continue nutritional supplements  Other medications -Continue Pepcid      MDM and disposition: The below labs and imaging reports were reviewed and summarized above.  Medication management as above.  The patient was admitted with nausea, found to have stroke as well as lung abscess..          DVT prophylaxis: Heparin Code Status: FULL Family Communication: None present    Consultants:   Neurology  Cardiology  Pulmonlogy  Procedures:   TEE  Antimicrobials:   Unasyn    Subjective: Feeling well.  Some moderate right sided burning chest pain, no change from previosu.  No sputum, fever.  No confusion, LOC.  Still with right sided  paresthesias and right sided vision loss.  Objective: Vitals:   01/23/19 1340 01/23/19 1342 01/23/19 1350 01/23/19 1552  BP: (!) 93/53 106/62 118/70 105/77  Pulse: 74 68 (!) 59 74  Resp: 16 18 19 18   Temp:    97.9 F (36.6 C)  TempSrc:    Oral  SpO2: 99% 98% 96% 100%  Weight:      Height:        Intake/Output Summary (Last 24 hours) at 01/23/2019 1855 Last data filed at 01/23/2019 1335 Gross per 24 hour  Intake 440 ml  Output -  Net 440 ml   Filed Weights   01/20/19 2012 01/20/19 2013 01/23/19 1227  Weight: 78.6 kg 78.6 kg 78.6 kg    Examination: General appearance:  adult female, alert and in no acute distress.   HEENT: Anicteric, conjunctiva pink, lids and lashes normal. No nasal deformity, discharge, epistaxis.  Lips moist.   Skin: Warm and dry.  no jaundice.  No suspicious rashes or lesions. Cardiac: RRR, nl S1-S2, no murmurs appreciated.  Capillary refill is brisk.  JVP not visible.  No LE edema.  Radia  pulses 2+ and symmetric. Respiratory: Normal respiratory rate and rhythm.  CTAB without rales or wheezes.Diminisehd on right Abdomen: Abdomen soft.  No TTP. No ascites, distension, hepatosplenomegaly.   MSK: No deformities or effusions. Neuro: Awake and alert.  EOMI, moves all extremities. Speech fluent.   Decreased sensation to touch on right.  Right sided hemianopsia. Psych: Sensorium intact and responding to questions, attention normal. Affect normal.  Judgment and insight appear normal.    Data Reviewed: I have personally reviewed following labs and imaging studies:  CBC: Recent Labs  Lab 01/20/19 1021 01/22/19 0427 01/23/19 0502  WBC 11.3* 8.5 9.7  HGB 15.5* 10.8* 11.9*  HCT 46.4* 33.6* 35.6*  MCV 83.3 88.2 86.8  PLT 570* 472* 585*   Basic Metabolic Panel: Recent Labs  Lab 01/20/19 1021 01/21/19 0505 01/22/19 0427 01/23/19 0502  NA 137 140 141 141  K 2.9* 3.0* 2.7* 3.3*  CL 96* 107 108 106  CO2 25 26 24 24   GLUCOSE 167* 97 123* 101*  BUN 12 8  8 11   CREATININE 0.74 0.64 0.61 0.81  CALCIUM 9.3 8.2* 8.1* 8.7*  MG 1.9  --   --   --    GFR: Estimated Creatinine Clearance: 85 mL/min (by C-G formula based on SCr of 0.81 mg/dL). Liver Function Tests: Recent Labs  Lab 01/20/19 1021 01/22/19 0427  AST 18 11*  ALT 17 11  ALKPHOS 94 53  BILITOT 0.8 0.2*  PROT 8.0 5.7*  ALBUMIN 3.7 2.6*   Recent Labs  Lab 01/20/19 1021  LIPASE 57*   No results for input(s): AMMONIA in the last 168 hours. Coagulation Profile: No results for input(s): INR, PROTIME in the last 168 hours. Cardiac Enzymes: No results for input(s): CKTOTAL, CKMB, CKMBINDEX, TROPONINI in the last 168 hours. BNP (last 3 results) No results for input(s): PROBNP in the last 8760 hours. HbA1C: Recent Labs    01/21/19 0505  HGBA1C 5.7*   CBG: No results for input(s): GLUCAP in the last 168 hours. Lipid Profile: Recent Labs    01/21/19 0505  CHOL 134  HDL 30*  LDLCALC 83  TRIG 106  CHOLHDL 4.5   Thyroid Function Tests: No results for input(s): TSH, T4TOTAL, FREET4, T3FREE, THYROIDAB in the last 72 hours. Anemia Panel: No results for input(s): VITAMINB12, FOLATE, FERRITIN, TIBC, IRON, RETICCTPCT in the last 72 hours. Urine analysis:    Component Value Date/Time   COLORURINE YELLOW 01/20/2019 1430   APPEARANCEUR CLEAR 01/20/2019 1430   LABSPEC 1.013 01/20/2019 1430   PHURINE 8.0 01/20/2019 1430   GLUCOSEU NEGATIVE 01/20/2019 1430   HGBUR NEGATIVE 01/20/2019 1430   BILIRUBINUR NEGATIVE 01/20/2019 1430   KETONESUR 5 (A) 01/20/2019 1430   PROTEINUR NEGATIVE 01/20/2019 1430   UROBILINOGEN 0.2 02/05/2015 2347   NITRITE NEGATIVE 01/20/2019 1430   LEUKOCYTESUR NEGATIVE 01/20/2019 1430   Sepsis Labs: @LABRCNTIP (procalcitonin:4,lacticacidven:4)  ) Recent Results (from the past 240 hour(s))  Culture, blood (Routine X 2) w Reflex to ID Panel     Status: None (Preliminary result)   Collection Time: 01/21/19  3:26 PM  Result Value Ref Range Status    Specimen Description RIGHT ANTECUBITAL  Final   Special Requests   Final    BOTTLES DRAWN AEROBIC AND ANAEROBIC Blood Culture adequate volume   Culture   Final    NO GROWTH 2 DAYS Performed at [facility], [address] [place],  [address]    Report Status PENDING  Incomplete         Radiology Studies: [place] Ekg Site Rite  Result Date: 01/22/2019 If Site Rite image not attached, placement could not be confirmed due to current cardiac rhythm.       Scheduled Meds: . aspirin EC  81 mg Oral Daily  . atorvastatin  80 mg Oral q1800  . buprenorphine-naloxone  1 tablet Sublingual Daily  . clopidogrel  75 mg Oral Daily  . famotidine  20 mg Oral BID  . feeding supplement  1 Container Oral TID BM  . feeding supplement (PRO-STAT SUGAR FREE 64)  30 mL Oral BID  . guaiFENesin  600 mg Oral BID  . heparin injection (subcutaneous)  5,000 Units Subcutaneous Q8H  . multivitamin with minerals  1 tablet Oral Daily  . saccharomyces boulardii  250 mg Oral BID  . sodium chloride flush  10-40 mL Intracatheter Q12H  . sodium chloride flush  3 mL Intravenous Once   Continuous Infusions: . ampicillin-sulbactam (UNASYN) IV 3 g (01/23/19 1709)     LOS: 2 days    Time spent: 35 minutes    Edwin Dada, MD [facility] 01/23/2019, 6:55 PM     Please page through [place]:  [email or web address] Password [email or web address] If 7PM-7AM, please contact night-coverage

## 2019-01-23 NOTE — CV Procedure (Signed)
   Transesophageal Echocardiogram  Indications: Cryptogenic stroke  Time out performed  Anesthesia: Propofol under supervision.  Findings:  Left Ventricle: Normal left regular ejection fraction 55% with no wall motion abnormalities  Mitral Valve: Normal mitral valve with no significant mitral regurgitation  Aortic Valve: Normal aortic valve  Tricuspid Valve: Moderate tricuspid regurgitation  Left Atrium: No thrombus  Right atrium was dilated as well as right ventricle was dilated  Bubble Contrast Study: Negative bubble study with no shunt  Impression: Negative TEE, no embolic source identified.  Candee Furbish, MD

## 2019-01-23 NOTE — Interval H&P Note (Signed)
History and Physical Interval Note:  01/23/2019 1:11 PM  Rita [name]  has presented today for surgery, with the diagnosis of stroke  The various methods of treatment have been discussed with the patient and family. After consideration of risks, benefits and other options for treatment, the patient has consented to  Procedure(s): TRANSESOPHAGEAL ECHOCARDIOGRAM (TEE) (N/A) as a surgical intervention .  The patient's history has been reviewed, patient examined, no change in status, stable for surgery.  I have reviewed the patient's chart and labs.  Questions were answered to the patient's satisfaction.     [employer]

## 2019-01-23 NOTE — H&P (View-Only) (Signed)
ELECTROPHYSIOLOGY CONSULT NOTE  Patient ID: Rita [name] MRN: [id number], DOB/AGE: 54-Jan-1967   Admit date: 01/20/2019 Date of Consult: 01/23/2019  Primary Physician: System, Pcp Not In Primary Cardiologist: Dr. Bronson Ing Reason for Consultation: Cryptogenic stroke ; recommendations regarding Implantable Loop Recorder, requested by Dr. Erlinda Hong  History of Present Illness Rita [name] was admitted on 01/20/2019 with stroke, transferred from [facility] with symptoms of stroke, findings of necrotic lung mass vs necrotic pneumonia for further management. PMHx noted for PVD (s/p left carotid subclavian bypass 3/209), ongoing smoker, HTN, COPD, HLD, barrett's esophagus/esophagitis, PUD. Neurology noted imaging Multiple left brain infarcts (occipital and ACA/MCA watershed) embolic secondary to unknown source   she has undergone workup for stroke including echocardiogram and carotid angio.  The patient has been monitored on telemetry which has demonstrated sinus rhythm with one NSVT only, no other arrhythmias.  Inpatient stroke work-up is to be completed with a TEE.    Echocardiogram this admission demonstrated  Study Conclusions - Left ventricle: The cavity size was normal. Wall thickness was   increased in a pattern of moderate LVH. Systolic function was   normal. The estimated ejection fraction was in the range of 60%   to 65%. Wall motion was normal; there were no regional wall   motion abnormalities. Left ventricular diastolic function   parameters were normal. - Aortic valve: Mildly calcified annulus. Trileaflet; mildly   thickened leaflets. Valve area (VTI): 1.94 cm^2. Valve area   (Vmax): 1.9 cm^2. - Left atrium: The atrium was mildly dilated. - Atrial septum: No defect or patent foramen ovale was identified.  Lab work is reviewed.    Past Medical History:  Diagnosis Date  . Arthritis    "qwhere" (02/22/2018)  . Barrett's esophagus with esophagitis 03/26/2013  . Chronic abdominal pain     . Chronic lower back pain   . Chronic pancreatitis ([place])   . COPD (chronic obstructive pulmonary disease) ([place])   . Depression   . Dyspnea   . GERD (gastroesophageal reflux disease)   . Heart murmur   . Hiatal hernia   . High cholesterol   . Hypertension   . Peptic ulcer   . Pneumonia 2000s X 1  . Tobacco use 03/26/2013     Surgical History:  Past Surgical History:  Procedure Laterality Date  . AORTIC ARCH ANGIOGRAPHY N/A 01/19/2018   Procedure: AORTIC ARCH ANGIOGRAPHY;  Surgeon: Elam Dutch, MD;  Location: [place] [facility];  Service: Cardiovascular;  Laterality: N/A;  . BIOPSY  04/17/2017   Procedure: BIOPSY;  Surgeon: Daneil Dolin, MD;  [facility];  Service: Endoscopy;;  esophageal  . CARDIAC CATHETERIZATION    . CAROTID-SUBCLAVIAN BYPASS GRAFT Left 03/19/2018   Procedure: BYPASS GRAFT CAROTID-SUBCLAVIAN;  Surgeon: Elam Dutch, MD;  Location: [facility] OR;  Service: Vascular;  Laterality: Left;  . [place]; 1988  . COLONOSCOPY WITH PROPOFOL N/A 04/17/2017   Procedure: COLONOSCOPY WITH PROPOFOL;  Surgeon: Daneil Dolin, MD;  [facility];  Service: Endoscopy;  Laterality: N/A;  100  . ESOPHAGOGASTRODUODENOSCOPY  Oct 2011   Dr. Laural Golden: ulcer in distal esophagus, soft stricture at GE junction s/p balloon dilation PATH: BARRETT'S  . ESOPHAGOGASTRODUODENOSCOPY (EGD) WITH ESOPHAGEAL DILATION N/A 03/27/2013   Procedure: ESOPHAGOGASTRODUODENOSCOPY (EGD) WITH ESOPHAGEAL DILATION;  Surgeon: Daneil Dolin, MD;  [facility];  Service: Endoscopy;  Laterality: N/A;  possible dilation  . ESOPHAGOGASTRODUODENOSCOPY (EGD) WITH PROPOFOL N/A 01/02/2017   Procedure:  ESOPHAGOGASTRODUODENOSCOPY (EGD) WITH PROPOFOL;  Surgeon: Daneil Dolin, MD;  [facility];  Service: Endoscopy;  Laterality: N/A;  with possible esophageal dilation  . ESOPHAGOGASTRODUODENOSCOPY (EGD) WITH PROPOFOL N/A 04/17/2017   Procedure: ESOPHAGOGASTRODUODENOSCOPY  (EGD) WITH PROPOFOL;  Surgeon: Daneil Dolin, MD;  [facility];  Service: Endoscopy;  Laterality: N/A;  . EUS N/A 05/09/2013   Procedure: UPPER ENDOSCOPIC ULTRASOUND (EUS) LINEAR;  Surgeon: Milus Banister, MD;  Location: [facility];  Service: Endoscopy;  Laterality: N/A;  . FRACTURE SURGERY     pt. denies  . KNEE ARTHROSCOPY Right   . LAPAROSCOPIC CHOLECYSTECTOMY    . LEFT HEART CATH AND CORONARY ANGIOGRAPHY N/A 02/26/2018   Procedure: LEFT HEART CATH AND CORONARY ANGIOGRAPHY;  Surgeon: Burnell Blanks, MD;  Location: [place] [facility];  Service: Cardiovascular;  Laterality: N/A;  . Venia Minks DILATION N/A 04/17/2017   Procedure: Venia Minks DILATION;  Surgeon: Daneil Dolin, MD;  [facility];  Service: Endoscopy;  Laterality: N/A;  . PATELLA FRACTURE SURGERY Left   . POLYPECTOMY  04/17/2017   Procedure: POLYPECTOMY;  Surgeon: Daneil Dolin, MD;  [facility];  Service: Endoscopy;;  . TUBAL LIGATION  1992  . UPPER EXTREMITY ANGIOGRAPHY N/A 01/19/2018   Procedure: UPPER EXTREMITY ANGIOGRAPHY;  Surgeon: Elam Dutch, MD;  Location: [place] [facility];  Service: Cardiovascular;  Laterality: N/A;     Medications Prior to Admission  Medication Sig Dispense Refill Last Dose  . Buprenorphine HCl-Naloxone HCl 8-2 MG FILM Take 1 Film by mouth daily. 1/2 film under the tongue   Past Week at Unknown time  . dexlansoprazole (DEXILANT) 60 MG capsule Take 1 capsule (60 mg total) by mouth at bedtime. 90 capsule 3 01/19/2019 at Unknown time  . amLODipine (NORVASC) 5 MG tablet Take 1 tablet (5 mg total) by mouth daily. (Patient taking differently: Take 5 mg by mouth at bedtime. ) 30 tablet 0 03/18/2018 at Unknown time  . amLODipine (NORVASC) 5 MG tablet TAKE 1 TABLET BY MOUTH DAILY 30 tablet 0   . aspirin EC 81 MG tablet Take 1 tablet (81 mg total) by mouth daily. (Patient taking differently: Take 81 mg by mouth at bedtime. )   03/18/2018 at Unknown time  .  atorvastatin (LIPITOR) 40 MG tablet Take 1 tablet (40 mg total) by mouth daily. (Patient taking differently: Take 40 mg by mouth at bedtime. ) 90 tablet 3 03/18/2018 at Unknown time  . escitalopram (LEXAPRO) 10 MG tablet Take 1 tablet (10 mg total) by mouth daily. (Patient taking differently: Take 10 mg by mouth at bedtime. ) 30 tablet 0 03/18/2018 at Unknown time  . escitalopram (LEXAPRO) 10 MG tablet TAKE 1 TABLET BY MOUTH DAILY 30 tablet 0   . metoCLOPramide (REGLAN) 5 MG tablet Take 1 tablet (5 mg total) by mouth 3 (three) times daily before meals. 90 tablet 0 03/18/2018 at Unknown time  . ondansetron (ZOFRAN) 4 MG tablet Take 1 tablet (4 mg total) by mouth every 8 (eight) hours as needed for nausea or vomiting. 30 tablet 3 03/18/2018 at Unknown time  . Oxycodone HCl 10 MG TABS Take 1 tablet (10 mg total) by mouth every 6 (six) hours as needed. 10 tablet 0   . potassium chloride SA (K-DUR,KLOR-CON) 20 MEQ tablet Take 20 mEq by mouth at bedtime.    03/18/2018 at Unknown time  . traZODone (DESYREL) 50 MG tablet Take 1 tablet (50 mg total) by mouth  at bedtime. 30 tablet 3 Past Month at Unknown time    Inpatient Medications:  . aspirin EC  81 mg Oral Daily  . atorvastatin  80 mg Oral q1800  . famotidine  20 mg Oral BID  . feeding supplement  1 Container Oral TID BM  . feeding supplement (PRO-STAT SUGAR FREE 64)  30 mL Oral BID  . guaiFENesin  600 mg Oral BID  . heparin injection (subcutaneous)  5,000 Units Subcutaneous Q8H  . multivitamin with minerals  1 tablet Oral Daily  . sodium chloride flush  3 mL Intravenous Once    Allergies: No Known Allergies  Social History   Socioeconomic History  . Marital status: Divorced    Spouse name: Not on file  . Number of children: Not on file  . Years of education: Not on file  . Highest education level: Not on file  Occupational History  . Occupation: farm  Social Needs  . Financial resource strain: Not on file  . Food insecurity:    Worry: Not  on file    Inability: Not on file  . Transportation needs:    Medical: Not on file    Non-medical: Not on file  Tobacco Use  . Smoking status: Current Every Day Smoker    Packs/day: 0.50    Years: 41.00    Pack years: 20.50    Types: Cigarettes    Start date: 09/26/1982  . Smokeless tobacco: Never Used  Substance and Sexual Activity  . Alcohol use: No  . Drug use: No  . Sexual activity: Not Currently    Partners: Male    Birth control/protection: Surgical  Lifestyle  . Physical activity:    Days per week: Not on file    Minutes per session: Not on file  . Stress: Not on file  Relationships  . Social connections:    Talks on phone: Not on file    Gets together: Not on file    Attends religious service: Not on file    Active member of club or organization: Not on file    Attends meetings of clubs or organizations: Not on file    Relationship status: Not on file  . Intimate partner violence:    Fear of current or ex partner: Not on file    Emotionally abused: Not on file    Physically abused: Not on file    Forced sexual activity: Not on file  Other Topics Concern  . Not on file  Social History Narrative   Lives in [place], [place].   Is on disability, was a Psychologist, sport and exercise and drove tractors.    Wears seatbelt.   Cannot eat meat due to esophagus.   Smokes cigarettes.   Drinks sodas, 1/2 liter a day.    3 boys and 1 girl. 2 boys live in [place]. 1 son died in 79th month of pregnancy.   Live with friend and his wife at this time.   Used to be married and was abandoned, he would not give her a divorce.        Family History  Problem Relation Age of Onset  . Asthma Mother   . Heart failure Mother   . Cancer Mother        pancreatic  . Diabetes Mother   . Hypertension Mother   . Stroke Mother   . Pancreatic cancer Mother        deceased  . Heart failure Father   . Diabetes Father   .  Colon cancer Neg Hx       Review of Systems: All other systems reviewed and are  otherwise negative except as noted above.  Physical Exam: Vitals:   01/22/19 2107 01/23/19 0035 01/23/19 0433 01/23/19 0738  BP:  (!) 121/59 116/71 (!) 144/92  Pulse:  64 67 67  Resp:  17 17 18   Temp:  98.2 F (36.8 C) 98.3 F (36.8 C) 97.9 F (36.6 C)  TempSrc:  Oral Oral Oral  SpO2: 98% 100% 100% 100%  Weight:      Height:        GEN- The patient is well appearing, alert and oriented x 3 today.   Head- normocephalic, atraumatic Eyes-  Sclera clear, conjunctiva pink Ears- hearing intact Oropharynx- clear Neck- supple Lungs- CTA b/l, normal work of breathing Heart- RRR, no murmurs, rubs or gallops  GI- soft, NT, ND Extremities- no clubbing, cyanosis, or edema MS- no significant deformity or atrophy Skin- no rash or lesion Psych- euthymic mood, full affect   Labs:   Lab Results  Component Value Date   WBC 9.7 01/23/2019   HGB 11.9 (L) 01/23/2019   HCT 35.6 (L) 01/23/2019   MCV 86.8 01/23/2019   PLT 501 (H) 01/23/2019    Recent Labs  Lab 01/22/19 0427 01/23/19 0502  NA 141 141  K 2.7* 3.3*  CL 108 106  CO2 24 24  BUN 8 11  CREATININE 0.61 0.81  CALCIUM 8.1* 8.7*  PROT 5.7*  --   BILITOT 0.2*  --   ALKPHOS 53  --   ALT 11  --   AST 11*  --   GLUCOSE 123* 101*   Lab Results  Component Value Date   CKTOTAL 41 06/23/2010   CKMB 1.0 06/23/2010   TROPONINI <0.03 02/22/2018   Lab Results  Component Value Date   CHOL 134 01/21/2019   CHOL 272 (H) 01/17/2018   CHOL 162 01/01/2017   Lab Results  Component Value Date   HDL 30 (L) 01/21/2019   HDL 42 (L) 01/17/2018   HDL 32 (L) 01/01/2017   Lab Results  Component Value Date   LDLCALC 83 01/21/2019   LDLCALC 171 (H) 01/17/2018   LDLCALC 98 01/01/2017   Lab Results  Component Value Date   TRIG 106 01/21/2019   TRIG 348 (H) 01/17/2018   TRIG 159 (H) 01/01/2017   Lab Results  Component Value Date   CHOLHDL 4.5 01/21/2019   CHOLHDL 6.5 (H) 01/17/2018   CHOLHDL 5.1 01/01/2017   No results  found for: LDLDIRECT  No results found for: DDIMER   Radiology/Studies:   Ct Head Wo Contrast Result Date: 01/20/2019 CLINICAL DATA:  Right-sided visual disturbance. EXAM: CT HEAD WITHOUT CONTRAST TECHNIQUE: Contiguous axial images were obtained from the base of the skull through the vertex without intravenous contrast. COMPARISON:  11/29/2009 FINDINGS: Brain: There is acute/subacute infarction in the left parieto-occipital junction region. There may be minimal petechial blood products but there is no frank hematoma. Mild swelling but no significant mass effect or shift. The remainder the brain appears negative. Vascular: No significant vascular finding. Ordinary atherosclerotic calcification in the carotid siphon regions. No finding on this noncontrast study to suggest venous thrombosis. Skull: Normal Sinuses/Orbits: Clear/normal Other: None IMPRESSION: Acute/subacute infarction in the left parieto-occipital junction region. Minimal petechial blood products but no frank hematoma. Mild swelling but no significant mass effect or shift. Electronically Signed   By: Nelson Chimes M.D.   On: 01/20/2019 19:15  Ct Chest W Contrast Result Date: 01/20/2019 CLINICAL DATA:  Nausea and vomiting with pain, initial encounter EXAM: CT CHEST, ABDOMEN, AND PELVIS WITH CONTRAST TECHNIQUE: Multidetector CT imaging of the chest, abdomen and pelvis was performed following the standard protocol during bolus administration of intravenous contrast. CONTRAST:  141mL ISOVUE-300 IOPAMIDOL (ISOVUE-300) INJECTION 61% COMPARISON:  Plain film from earlier in the same day as well as CT from 02/21/2018 FINDINGS: CT CHEST FINDINGS Cardiovascular: Thoracic aorta is within normal limits. No cardiac enlargement is seen. The pulmonary artery as visualized is within normal limits. 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 Opacification of the more distal subclavian artery is noted  secondary to a left carotid to subclavian bypass. Mediastinum/Nodes: Thoracic inlet is within normal limits.The esophagus demonstrates some diffuse wall thickening likely related to underlying reflux. The overall appearance is stable from the prior exam. No left hilar adenopathy is noted. No significant mediastinal adenopathy is noted. Scattered small right hilar lymph nodes are seen likely reactive in nature. Lungs/Pleura: Lungs are well aerated bilaterally. A few scattered calcified granulomas are seen. In the superior segment of the right lower lobe, there is a focal 3.8 x 4.3 cm mass lesion with central decreased attenuation identified consistent with fluid. Some suggestion of air within the central portion of the lesion is noted on the coronal and sagittal reconstructions . The lungs are otherwise clear. No sizable effusion is noted. Musculoskeletal: No acute bony abnormality is noted. CT ABDOMEN PELVIS FINDINGS Hepatobiliary: Fatty infiltration of the liver is noted. The gallbladder has been surgically removed. Pancreas: Unremarkable. No pancreatic ductal dilatation or surrounding inflammatory changes. Spleen: Normal in size without focal abnormality. Adrenals/Urinary Tract: Adrenal glands are within normal limits. Kidneys are well visualized bilaterally without renal calculi or obstructive changes. Ureters are within normal limits. The bladder is partially distended. Stomach/Bowel: The appendix is within normal limits. No obstructive or inflammatory changes of the bowel are seen. Small sliding-type hiatal hernia is noted as well as the aforementioned esophageal thickening distally consistent with reflux disease. Vascular/Lymphatic: Aortic atherosclerosis. No enlarged abdominal or pelvic lymph nodes. Reproductive: Uterus and bilateral adnexa are unremarkable. Other: No abdominal wall hernia or abnormality. No abdominopelvic ascites. Musculoskeletal: Degenerative changes of the lumbar spine are noted.  IMPRESSION: Rounded masslike density in the superior segment of the right lower lobe as described. There is central decreased attenuation as well as a few foci of what appears to be air within on the sagittal and coronal reconstructions. Although this could represent a necrotic neoplasm, short-term treatment for necrotic pneumonia/lung abscess is recommended with repeat imaging to assess for resolution. If the lesion persists on subsequent imaging more aggressive workup can be performed. Chronic changes as described above. Electronically Signed   By: Inez Catalina M.D.   On: 01/20/2019 15:56    Mr Jodene Nam Head Wo Contrast Result Date: 01/21/2019 CLINICAL DATA:  Headache and weakness. Abnormal CT scan. Visual field deficits for 1 week. Gait abnormalities. Headaches. EXAM: MRI HEAD WITHOUT CONTRAST MRA HEAD WITHOUT CONTRAST MRA NECK WITHOUT AND WITH CONTRAST TECHNIQUE: Multiplanar, multiecho pulse sequences of the brain and surrounding structures were obtained without contrast. Angiographic images of the Circle of Willis were obtained using MRA technique without intravenous contrast. Angiographic images of the neck were obtained using MRA technique without and with intravenous contrast. Carotid stenosis measurements (when applicable) are obtained utilizing NASCET criteria, using the distal internal carotid diameter as the denominator. CONTRAST:  7 mL Gadavist  COMPARISON:  CT head without contrast 01/20/2019 FINDINGS: MRI HEAD FINDINGS Brain: A left MCA/PCA acute/subacute watershed infarct is confirmed. Additional foci of restricted diffusion extend over the convexity in the left MCA/ACA watershed territory. There is minimal, if any, involvement of the precentral gyrus. T2 signal changes are associated with the areas of acute/subacute infarction, compatible with a time frame over the last week. Minimal subcortical T2 changes are present in the right frontal lobe and more anterior left frontal lobe. Basal ganglia are  intact. Insert normal IAC's The brainstem and cerebellum are within normal limits. Vascular: Flow is present in the major intracranial arteries. Skull and upper cervical spine: The craniocervical junction is normal. Upper cervical spine is within normal limits. Marrow signal is unremarkable. Sinuses/Orbits: Mild mucosal thickening is present within the ethmoid air cells. The paranasal sinuses and mastoid air cells are otherwise clear. MRA HEAD FINDINGS Atherosclerotic irregularity is present within the cavernous internal carotid arteries bilaterally. There is a moderate stenosis in the distal left A1 segment. The right A1 segments is normal. The left M1 segment is normal. There is a moderate stenosis of the distal right M1 segment with significant signal loss. Distal small vessel disease is present in the MCA distributions bilaterally. No other significant proximal disease is present. There is no flow in the vertebral artery apart from minimal signal is proximal to the vertebrobasilar junction. There is a moderate stenosis of the residual right vertebral artery at the PICA. Irregularity is present throughout the basilar artery without a significant stenosis. There is mild irregularity of the proximal PCA vessels bilaterally without significant stenosis. Right greater than left distal PCA branch vessel attenuation is present. MRA NECK FINDINGS The time-of-flight images demonstrate no significant flow disturbance at either carotid bifurcation. There is no antegrade flow signal in the left vertebral artery. The right common carotid artery is within normal limits. There is moderate stenosis at the right external carotid artery. No significant stenosis is present in the right ICA through the ICA terminus. The left common carotid artery is within normal limits. Bifurcation is unremarkable. There is no significant stenosis in the left ICA through the terminus. There is moderate narrowing at the origin of the dominant right  vertebral artery. Additional narrowing is present at the dural margin of the right vertebral artery. Distal left vertebral artery occlusion is present. There is some signal in the left vertebral artery to the level of the dura. IMPRESSION: 1. Acute/subacute nonhemorrhagic left occipital lobe infarct. This may be a lateral PCA or watershed infarct. 2. Additional acute/subacute nonhemorrhagic left ACA/MCA watershed infarcts over the left convexity. 3. Subcortical white matter disease is mildly advanced for age. This likely reflects the sequela of chronic microvascular ischemia. 4. 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 5. Contrast in the left vertebral artery proximal to the dural segment. Limited time-of-flight signal suggests slow or reversed flow. 6. Moderate distal right M1 segment stenosis. 7. Moderate distal left A1 segment stenosis. 8. Diffuse small vessel disease present on the MRA circle-of-Willis. 9. Mild irregularity in the basilar artery and proximal PCA vessels bilaterally with asymmetric attenuation of distal branches, right greater than left. Electronically Signed   By: San Morelle M.D.   On: 01/21/2019 09:07     US Venous Img Lower Bilateral Result Date: 01/21/2019 CLINICAL DATA:  Bilateral lower extremity pain and edema for the past 3 years. History of pulmonary embolism. Evaluate for acute or chronic DVT. EXAM: BILATERAL LOWER EXTREMITY VENOUS DOPPLER ULTRASOUND TECHNIQUE: Gray-scale sonography with  graded compression, as well as color Doppler and duplex ultrasound were performed to evaluate the lower extremity deep venous systems from the level of the common femoral vein and including the common femoral, femoral, profunda femoral, popliteal and calf veins including the posterior tibial, peroneal and gastrocnemius veins when visible. The superficial great saphenous vein was also interrogated. Spectral Doppler was utilized to evaluate flow at rest and with distal augmentation  maneuvers in the common femoral, femoral and popliteal veins. COMPARISON:  None. FINDINGS: RIGHT LOWER EXTREMITY Common Femoral Vein: No evidence of thrombus. Normal compressibility, respiratory phasicity and response to augmentation. Saphenofemoral Junction: No evidence of thrombus. Normal compressibility and flow on color Doppler imaging. Profunda Femoral Vein: No evidence of thrombus. Normal compressibility and flow on color Doppler imaging. Femoral Vein: No evidence of thrombus. Normal compressibility, respiratory phasicity and response to augmentation. Popliteal Vein: No evidence of thrombus. Normal compressibility, respiratory phasicity and response to augmentation. Calf Veins: No evidence of thrombus. Normal compressibility and flow on color Doppler imaging. Superficial Great Saphenous Vein: No evidence of thrombus. Normal compressibility. Venous Reflux:  None. Other Findings: Minimal amount of eccentric echogenic plaque is seen within the right common femoral artery (image 2 and 3). LEFT LOWER EXTREMITY Common Femoral Vein: No evidence of thrombus. Normal compressibility, respiratory phasicity and response to augmentation. Saphenofemoral Junction: No evidence of thrombus. Normal compressibility and flow on color Doppler imaging. Profunda Femoral Vein: No evidence of thrombus. Normal compressibility and flow on color Doppler imaging. Femoral Vein: No evidence of thrombus. Normal compressibility, respiratory phasicity and response to augmentation. Popliteal Vein: No evidence of thrombus. Normal compressibility, respiratory phasicity and response to augmentation. Calf Veins: No evidence of thrombus. Normal compressibility and flow on color Doppler imaging. Superficial Great Saphenous Vein: No evidence of thrombus. Normal compressibility. Venous Reflux:  None. Other Findings:  None. IMPRESSION: No evidence acute or chronic DVT within either lower extremity. Electronically Signed   By: Sandi Mariscal M.D.   On:  01/21/2019 15:08   Dg Abd Acute 2+v W 1v Chest  Result Date: 01/20/2019 CLINICAL DATA:  Abdominal pain, nausea, vomiting, severe watery diarrhea, fevers and decreased urinary output. Smoker. EXAM: DG ABDOMEN ACUTE W/ 1V CHEST COMPARISON:  Portable chest dated 03/19/2018, chest CTA dated 02/21/2018 and abdomen and pelvis CT dated 07/19/2017. FINDINGS: Normal sized heart. Interval 4.2 cm rounded mass-like density with poorly defined margins in the right mid lung on the frontal view of the chest. Moderate diffuse peribronchial thickening and accentuation of the interstitial markings with mild progression. Normal bowel gas pattern without free peritoneal air. Cholecystectomy clips. Minimal lumbar spine degenerative changes. IMPRESSION: 1. Interval 4.2 cm rounded mass-like density in the right mid lung. This is concerning for a primary lung neoplasm. A rounded area of pneumonia is less likely. Further evaluation with a chest CT with contrast is recommended. 2. Progressive bronchitic changes. 3. No acute abdominal abnormality. Electronically Signed   By: Claudie Revering M.D.   On: 01/20/2019 13:18    12-lead ECG SR, RBBB All prior EKG's in EPIC reviewed with no documented atrial fibrillation  Telemetry SR, one NSVT only  Assessment and Plan:  1. Cryptogenic stroke The patient presents with cryptogenic (watershed) stroke.  (PVD/patent carotid/subclavian bypass, not felt to contribute)The patient has a TEE planned for this AM.  She has picc placed this AM for IV antibiotics 2/2 necrtotic lung mass vs cancer.  She is afebrile, wbc wnl. Though despite not being an intravascular device, given plans to treat initially with  long term IV antibiotics would hold off on an implantable device (loop) and recommend starting with an event monitor.  Recommend f/u with Dr. Bronson Ing for this.  The patient is agreeable with this strategy  Ongoing potassium replacement deferred to medicine team   Please call with  questions.   Baldwin Jamaica, PA-C 01/23/2019  I have seen and examined this patient with Tommye Standard.  Agree with above, note added to reflect my findings.  On exam, RRR, no murmurs, lungs clear. Patietn admitted with cryptogenic stroke, found to have necrotic lung abscess. Patient to have TEE today to rule out endocarditis. Plan for likely monitor as outpatient as she has had palpitations in the past. Chantella Creech have her follow up with primary cardiology.    Alessio Bogan M. Georgann Bramble MD 01/23/2019 12:13 PM

## 2019-01-23 NOTE — Consult Note (Addendum)
ELECTROPHYSIOLOGY CONSULT NOTE  Patient ID: Rita [name] MRN: [id number], DOB/AGE: December 29, 1965   Admit date: 01/20/2019 Date of Consult: 01/23/2019  Primary Physician: System, Pcp Not In Primary Cardiologist: Dr. Bronson Ing Reason for Consultation: Cryptogenic stroke ; recommendations regarding Implantable Loop Recorder, requested by Dr. Erlinda Hong  History of Present Illness Rita [name] was admitted on 01/20/2019 with stroke, transferred from [facility] with symptoms of stroke, findings of necrotic lung mass vs necrotic pneumonia for further management. PMHx noted for PVD (s/p left carotid subclavian bypass 3/209), ongoing smoker, HTN, COPD, HLD, barrett's esophagus/esophagitis, PUD. Neurology noted imaging Multiple left brain infarcts (occipital and ACA/MCA watershed) embolic secondary to unknown source   she has undergone workup for stroke including echocardiogram and carotid angio.  The patient has been monitored on telemetry which has demonstrated sinus rhythm with one NSVT only, no other arrhythmias.  Inpatient stroke work-up is to be completed with a TEE.    Echocardiogram this admission demonstrated  Study Conclusions - Left ventricle: The cavity size was normal. Wall thickness was   increased in a pattern of moderate LVH. Systolic function was   normal. The estimated ejection fraction was in the range of 60%   to 65%. Wall motion was normal; there were no regional wall   motion abnormalities. Left ventricular diastolic function   parameters were normal. - Aortic valve: Mildly calcified annulus. Trileaflet; mildly   thickened leaflets. Valve area (VTI): 1.94 cm^2. Valve area   (Vmax): 1.9 cm^2. - Left atrium: The atrium was mildly dilated. - Atrial septum: No defect or patent foramen ovale was identified.  Lab work is reviewed.    Past Medical History:  Diagnosis Date  . Arthritis    "qwhere" (02/22/2018)  . Barrett's esophagus with esophagitis 03/26/2013  . Chronic abdominal pain     . Chronic lower back pain   . Chronic pancreatitis ([place])   . COPD (chronic obstructive pulmonary disease) ([place])   . Depression   . Dyspnea   . GERD (gastroesophageal reflux disease)   . Heart murmur   . Hiatal hernia   . High cholesterol   . Hypertension   . Peptic ulcer   . Pneumonia 2000s X 1  . Tobacco use 03/26/2013     Surgical History:  Past Surgical History:  Procedure Laterality Date  . AORTIC ARCH ANGIOGRAPHY N/A 01/19/2018   Procedure: AORTIC ARCH ANGIOGRAPHY;  Surgeon: Elam Dutch, MD;  Location: [place] [facility];  Service: Cardiovascular;  Laterality: N/A;  . BIOPSY  04/17/2017   Procedure: BIOPSY;  Surgeon: Daneil Dolin, MD;  [facility];  Service: Endoscopy;;  esophageal  . CARDIAC CATHETERIZATION    . CAROTID-SUBCLAVIAN BYPASS GRAFT Left 03/19/2018   Procedure: BYPASS GRAFT CAROTID-SUBCLAVIAN;  Surgeon: Elam Dutch, MD;  Location: [facility] OR;  Service: Vascular;  Laterality: Left;  . [place]; 1988  . COLONOSCOPY WITH PROPOFOL N/A 04/17/2017   Procedure: COLONOSCOPY WITH PROPOFOL;  Surgeon: Daneil Dolin, MD;  [facility];  Service: Endoscopy;  Laterality: N/A;  100  . ESOPHAGOGASTRODUODENOSCOPY  Oct 2011   Dr. Laural Golden: ulcer in distal esophagus, soft stricture at GE junction s/p balloon dilation PATH: BARRETT'S  . ESOPHAGOGASTRODUODENOSCOPY (EGD) WITH ESOPHAGEAL DILATION N/A 03/27/2013   Procedure: ESOPHAGOGASTRODUODENOSCOPY (EGD) WITH ESOPHAGEAL DILATION;  Surgeon: Daneil Dolin, MD;  [facility];  Service: Endoscopy;  Laterality: N/A;  possible dilation  . ESOPHAGOGASTRODUODENOSCOPY (EGD) WITH PROPOFOL N/A 01/02/2017   Procedure:  ESOPHAGOGASTRODUODENOSCOPY (EGD) WITH PROPOFOL;  Surgeon: Daneil Dolin, MD;  [facility];  Service: Endoscopy;  Laterality: N/A;  with possible esophageal dilation  . ESOPHAGOGASTRODUODENOSCOPY (EGD) WITH PROPOFOL N/A 04/17/2017   Procedure: ESOPHAGOGASTRODUODENOSCOPY  (EGD) WITH PROPOFOL;  Surgeon: Daneil Dolin, MD;  [facility];  Service: Endoscopy;  Laterality: N/A;  . EUS N/A 05/09/2013   Procedure: UPPER ENDOSCOPIC ULTRASOUND (EUS) LINEAR;  Surgeon: Milus Banister, MD;  Location: [facility];  Service: Endoscopy;  Laterality: N/A;  . FRACTURE SURGERY     pt. denies  . KNEE ARTHROSCOPY Right   . LAPAROSCOPIC CHOLECYSTECTOMY    . LEFT HEART CATH AND CORONARY ANGIOGRAPHY N/A 02/26/2018   Procedure: LEFT HEART CATH AND CORONARY ANGIOGRAPHY;  Surgeon: Burnell Blanks, MD;  Location: [place] [facility];  Service: Cardiovascular;  Laterality: N/A;  . Venia Minks DILATION N/A 04/17/2017   Procedure: Venia Minks DILATION;  Surgeon: Daneil Dolin, MD;  [facility];  Service: Endoscopy;  Laterality: N/A;  . PATELLA FRACTURE SURGERY Left   . POLYPECTOMY  04/17/2017   Procedure: POLYPECTOMY;  Surgeon: Daneil Dolin, MD;  [facility];  Service: Endoscopy;;  . TUBAL LIGATION  1992  . UPPER EXTREMITY ANGIOGRAPHY N/A 01/19/2018   Procedure: UPPER EXTREMITY ANGIOGRAPHY;  Surgeon: Elam Dutch, MD;  Location: [place] [facility];  Service: Cardiovascular;  Laterality: N/A;     Medications Prior to Admission  Medication Sig Dispense Refill Last Dose  . Buprenorphine HCl-Naloxone HCl 8-2 MG FILM Take 1 Film by mouth daily. 1/2 film under the tongue   Past Week at Unknown time  . dexlansoprazole (DEXILANT) 60 MG capsule Take 1 capsule (60 mg total) by mouth at bedtime. 90 capsule 3 01/19/2019 at Unknown time  . amLODipine (NORVASC) 5 MG tablet Take 1 tablet (5 mg total) by mouth daily. (Patient taking differently: Take 5 mg by mouth at bedtime. ) 30 tablet 0 03/18/2018 at Unknown time  . amLODipine (NORVASC) 5 MG tablet TAKE 1 TABLET BY MOUTH DAILY 30 tablet 0   . aspirin EC 81 MG tablet Take 1 tablet (81 mg total) by mouth daily. (Patient taking differently: Take 81 mg by mouth at bedtime. )   03/18/2018 at Unknown time  .  atorvastatin (LIPITOR) 40 MG tablet Take 1 tablet (40 mg total) by mouth daily. (Patient taking differently: Take 40 mg by mouth at bedtime. ) 90 tablet 3 03/18/2018 at Unknown time  . escitalopram (LEXAPRO) 10 MG tablet Take 1 tablet (10 mg total) by mouth daily. (Patient taking differently: Take 10 mg by mouth at bedtime. ) 30 tablet 0 03/18/2018 at Unknown time  . escitalopram (LEXAPRO) 10 MG tablet TAKE 1 TABLET BY MOUTH DAILY 30 tablet 0   . metoCLOPramide (REGLAN) 5 MG tablet Take 1 tablet (5 mg total) by mouth 3 (three) times daily before meals. 90 tablet 0 03/18/2018 at Unknown time  . ondansetron (ZOFRAN) 4 MG tablet Take 1 tablet (4 mg total) by mouth every 8 (eight) hours as needed for nausea or vomiting. 30 tablet 3 03/18/2018 at Unknown time  . Oxycodone HCl 10 MG TABS Take 1 tablet (10 mg total) by mouth every 6 (six) hours as needed. 10 tablet 0   . potassium chloride SA (K-DUR,KLOR-CON) 20 MEQ tablet Take 20 mEq by mouth at bedtime.    03/18/2018 at Unknown time  . traZODone (DESYREL) 50 MG tablet Take 1 tablet (50 mg total) by mouth  at bedtime. 30 tablet 3 Past Month at Unknown time    Inpatient Medications:  . aspirin EC  81 mg Oral Daily  . atorvastatin  80 mg Oral q1800  . famotidine  20 mg Oral BID  . feeding supplement  1 Container Oral TID BM  . feeding supplement (PRO-STAT SUGAR FREE 64)  30 mL Oral BID  . guaiFENesin  600 mg Oral BID  . heparin injection (subcutaneous)  5,000 Units Subcutaneous Q8H  . multivitamin with minerals  1 tablet Oral Daily  . sodium chloride flush  3 mL Intravenous Once    Allergies: No Known Allergies  Social History   Socioeconomic History  . Marital status: Divorced    Spouse name: Not on file  . Number of children: Not on file  . Years of education: Not on file  . Highest education level: Not on file  Occupational History  . Occupation: farm  Social Needs  . Financial resource strain: Not on file  . Food insecurity:    Worry: Not  on file    Inability: Not on file  . Transportation needs:    Medical: Not on file    Non-medical: Not on file  Tobacco Use  . Smoking status: Current Every Day Smoker    Packs/day: 0.50    Years: 41.00    Pack years: 20.50    Types: Cigarettes    Start date: 09/26/1982  . Smokeless tobacco: Never Used  Substance and Sexual Activity  . Alcohol use: No  . Drug use: No  . Sexual activity: Not Currently    Partners: Male    Birth control/protection: Surgical  Lifestyle  . Physical activity:    Days per week: Not on file    Minutes per session: Not on file  . Stress: Not on file  Relationships  . Social connections:    Talks on phone: Not on file    Gets together: Not on file    Attends religious service: Not on file    Active member of club or organization: Not on file    Attends meetings of clubs or organizations: Not on file    Relationship status: Not on file  . Intimate partner violence:    Fear of current or ex partner: Not on file    Emotionally abused: Not on file    Physically abused: Not on file    Forced sexual activity: Not on file  Other Topics Concern  . Not on file  Social History Narrative   Lives in [place], [place].   Is on disability, was a Psychologist, sport and exercise and drove tractors.    Wears seatbelt.   Cannot eat meat due to esophagus.   Smokes cigarettes.   Drinks sodas, 1/2 liter a day.    3 boys and 1 girl. 2 boys live in [place]. 1 son died in 77th month of pregnancy.   Live with friend and his wife at this time.   Used to be married and was abandoned, he would not give her a divorce.        Family History  Problem Relation Age of Onset  . Asthma Mother   . Heart failure Mother   . Cancer Mother        pancreatic  . Diabetes Mother   . Hypertension Mother   . Stroke Mother   . Pancreatic cancer Mother        deceased  . Heart failure Father   . Diabetes Father   .  Colon cancer Neg Hx       Review of Systems: All other systems reviewed and are  otherwise negative except as noted above.  Physical Exam: Vitals:   01/22/19 2107 01/23/19 0035 01/23/19 0433 01/23/19 0738  BP:  (!) 121/59 116/71 (!) 144/92  Pulse:  64 67 67  Resp:  17 17 18   Temp:  98.2 F (36.8 C) 98.3 F (36.8 C) 97.9 F (36.6 C)  TempSrc:  Oral Oral Oral  SpO2: 98% 100% 100% 100%  Weight:      Height:        GEN- The patient is well appearing, alert and oriented x 3 today.   Head- normocephalic, atraumatic Eyes-  Sclera clear, conjunctiva pink Ears- hearing intact Oropharynx- clear Neck- supple Lungs- CTA b/l, normal work of breathing Heart- RRR, no murmurs, rubs or gallops  GI- soft, NT, ND Extremities- no clubbing, cyanosis, or edema MS- no significant deformity or atrophy Skin- no rash or lesion Psych- euthymic mood, full affect   Labs:   Lab Results  Component Value Date   WBC 9.7 01/23/2019   HGB 11.9 (L) 01/23/2019   HCT 35.6 (L) 01/23/2019   MCV 86.8 01/23/2019   PLT 501 (H) 01/23/2019    Recent Labs  Lab 01/22/19 0427 01/23/19 0502  NA 141 141  K 2.7* 3.3*  CL 108 106  CO2 24 24  BUN 8 11  CREATININE 0.61 0.81  CALCIUM 8.1* 8.7*  PROT 5.7*  --   BILITOT 0.2*  --   ALKPHOS 53  --   ALT 11  --   AST 11*  --   GLUCOSE 123* 101*   Lab Results  Component Value Date   CKTOTAL 41 06/23/2010   CKMB 1.0 06/23/2010   TROPONINI <0.03 02/22/2018   Lab Results  Component Value Date   CHOL 134 01/21/2019   CHOL 272 (H) 01/17/2018   CHOL 162 01/01/2017   Lab Results  Component Value Date   HDL 30 (L) 01/21/2019   HDL 42 (L) 01/17/2018   HDL 32 (L) 01/01/2017   Lab Results  Component Value Date   LDLCALC 83 01/21/2019   LDLCALC 171 (H) 01/17/2018   LDLCALC 98 01/01/2017   Lab Results  Component Value Date   TRIG 106 01/21/2019   TRIG 348 (H) 01/17/2018   TRIG 159 (H) 01/01/2017   Lab Results  Component Value Date   CHOLHDL 4.5 01/21/2019   CHOLHDL 6.5 (H) 01/17/2018   CHOLHDL 5.1 01/01/2017   No results  found for: LDLDIRECT  No results found for: DDIMER   Radiology/Studies:   Ct Head Wo Contrast Result Date: 01/20/2019 CLINICAL DATA:  Right-sided visual disturbance. EXAM: CT HEAD WITHOUT CONTRAST TECHNIQUE: Contiguous axial images were obtained from the base of the skull through the vertex without intravenous contrast. COMPARISON:  11/29/2009 FINDINGS: Brain: There is acute/subacute infarction in the left parieto-occipital junction region. There may be minimal petechial blood products but there is no frank hematoma. Mild swelling but no significant mass effect or shift. The remainder the brain appears negative. Vascular: No significant vascular finding. Ordinary atherosclerotic calcification in the carotid siphon regions. No finding on this noncontrast study to suggest venous thrombosis. Skull: Normal Sinuses/Orbits: Clear/normal Other: None IMPRESSION: Acute/subacute infarction in the left parieto-occipital junction region. Minimal petechial blood products but no frank hematoma. Mild swelling but no significant mass effect or shift. Electronically Signed   By: Nelson Chimes M.D.   On: 01/20/2019 19:15  Ct Chest W Contrast Result Date: 01/20/2019 CLINICAL DATA:  Nausea and vomiting with pain, initial encounter EXAM: CT CHEST, ABDOMEN, AND PELVIS WITH CONTRAST TECHNIQUE: Multidetector CT imaging of the chest, abdomen and pelvis was performed following the standard protocol during bolus administration of intravenous contrast. CONTRAST:  138mL ISOVUE-300 IOPAMIDOL (ISOVUE-300) INJECTION 61% COMPARISON:  Plain film from earlier in the same day as well as CT from 02/21/2018 FINDINGS: CT CHEST FINDINGS Cardiovascular: Thoracic aorta is within normal limits. No cardiac enlargement is seen. The pulmonary artery as visualized is within normal limits. 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 Opacification of the more distal subclavian artery is noted  secondary to a left carotid to subclavian bypass. Mediastinum/Nodes: Thoracic inlet is within normal limits.The esophagus demonstrates some diffuse wall thickening likely related to underlying reflux. The overall appearance is stable from the prior exam. No left hilar adenopathy is noted. No significant mediastinal adenopathy is noted. Scattered small right hilar lymph nodes are seen likely reactive in nature. Lungs/Pleura: Lungs are well aerated bilaterally. A few scattered calcified granulomas are seen. In the superior segment of the right lower lobe, there is a focal 3.8 x 4.3 cm mass lesion with central decreased attenuation identified consistent with fluid. Some suggestion of air within the central portion of the lesion is noted on the coronal and sagittal reconstructions . The lungs are otherwise clear. No sizable effusion is noted. Musculoskeletal: No acute bony abnormality is noted. CT ABDOMEN PELVIS FINDINGS Hepatobiliary: Fatty infiltration of the liver is noted. The gallbladder has been surgically removed. Pancreas: Unremarkable. No pancreatic ductal dilatation or surrounding inflammatory changes. Spleen: Normal in size without focal abnormality. Adrenals/Urinary Tract: Adrenal glands are within normal limits. Kidneys are well visualized bilaterally without renal calculi or obstructive changes. Ureters are within normal limits. The bladder is partially distended. Stomach/Bowel: The appendix is within normal limits. No obstructive or inflammatory changes of the bowel are seen. Small sliding-type hiatal hernia is noted as well as the aforementioned esophageal thickening distally consistent with reflux disease. Vascular/Lymphatic: Aortic atherosclerosis. No enlarged abdominal or pelvic lymph nodes. Reproductive: Uterus and bilateral adnexa are unremarkable. Other: No abdominal wall hernia or abnormality. No abdominopelvic ascites. Musculoskeletal: Degenerative changes of the lumbar spine are noted.  IMPRESSION: Rounded masslike density in the superior segment of the right lower lobe as described. There is central decreased attenuation as well as a few foci of what appears to be air within on the sagittal and coronal reconstructions. Although this could represent a necrotic neoplasm, short-term treatment for necrotic pneumonia/lung abscess is recommended with repeat imaging to assess for resolution. If the lesion persists on subsequent imaging more aggressive workup can be performed. Chronic changes as described above. Electronically Signed   By: Inez Catalina M.D.   On: 01/20/2019 15:56    Mr Jodene Nam Head Wo Contrast Result Date: 01/21/2019 CLINICAL DATA:  Headache and weakness. Abnormal CT scan. Visual field deficits for 1 week. Gait abnormalities. Headaches. EXAM: MRI HEAD WITHOUT CONTRAST MRA HEAD WITHOUT CONTRAST MRA NECK WITHOUT AND WITH CONTRAST TECHNIQUE: Multiplanar, multiecho pulse sequences of the brain and surrounding structures were obtained without contrast. Angiographic images of the Circle of Willis were obtained using MRA technique without intravenous contrast. Angiographic images of the neck were obtained using MRA technique without and with intravenous contrast. Carotid stenosis measurements (when applicable) are obtained utilizing NASCET criteria, using the distal internal carotid diameter as the denominator. CONTRAST:  7 mL Gadavist  COMPARISON:  CT head without contrast 01/20/2019 FINDINGS: MRI HEAD FINDINGS Brain: A left MCA/PCA acute/subacute watershed infarct is confirmed. Additional foci of restricted diffusion extend over the convexity in the left MCA/ACA watershed territory. There is minimal, if any, involvement of the precentral gyrus. T2 signal changes are associated with the areas of acute/subacute infarction, compatible with a time frame over the last week. Minimal subcortical T2 changes are present in the right frontal lobe and more anterior left frontal lobe. Basal ganglia are  intact. Insert normal IAC's The brainstem and cerebellum are within normal limits. Vascular: Flow is present in the major intracranial arteries. Skull and upper cervical spine: The craniocervical junction is normal. Upper cervical spine is within normal limits. Marrow signal is unremarkable. Sinuses/Orbits: Mild mucosal thickening is present within the ethmoid air cells. The paranasal sinuses and mastoid air cells are otherwise clear. MRA HEAD FINDINGS Atherosclerotic irregularity is present within the cavernous internal carotid arteries bilaterally. There is a moderate stenosis in the distal left A1 segment. The right A1 segments is normal. The left M1 segment is normal. There is a moderate stenosis of the distal right M1 segment with significant signal loss. Distal small vessel disease is present in the MCA distributions bilaterally. No other significant proximal disease is present. There is no flow in the vertebral artery apart from minimal signal is proximal to the vertebrobasilar junction. There is a moderate stenosis of the residual right vertebral artery at the PICA. Irregularity is present throughout the basilar artery without a significant stenosis. There is mild irregularity of the proximal PCA vessels bilaterally without significant stenosis. Right greater than left distal PCA branch vessel attenuation is present. MRA NECK FINDINGS The time-of-flight images demonstrate no significant flow disturbance at either carotid bifurcation. There is no antegrade flow signal in the left vertebral artery. The right common carotid artery is within normal limits. There is moderate stenosis at the right external carotid artery. No significant stenosis is present in the right ICA through the ICA terminus. The left common carotid artery is within normal limits. Bifurcation is unremarkable. There is no significant stenosis in the left ICA through the terminus. There is moderate narrowing at the origin of the dominant right  vertebral artery. Additional narrowing is present at the dural margin of the right vertebral artery. Distal left vertebral artery occlusion is present. There is some signal in the left vertebral artery to the level of the dura. IMPRESSION: 1. Acute/subacute nonhemorrhagic left occipital lobe infarct. This may be a lateral PCA or watershed infarct. 2. Additional acute/subacute nonhemorrhagic left ACA/MCA watershed infarcts over the left convexity. 3. Subcortical white matter disease is mildly advanced for age. This likely reflects the sequela of chronic microvascular ischemia. 4. 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 5. Contrast in the left vertebral artery proximal to the dural segment. Limited time-of-flight signal suggests slow or reversed flow. 6. Moderate distal right M1 segment stenosis. 7. Moderate distal left A1 segment stenosis. 8. Diffuse small vessel disease present on the MRA circle-of-Willis. 9. Mild irregularity in the basilar artery and proximal PCA vessels bilaterally with asymmetric attenuation of distal branches, right greater than left. Electronically Signed   By: San Morelle M.D.   On: 01/21/2019 09:07     US Venous Img Lower Bilateral Result Date: 01/21/2019 CLINICAL DATA:  Bilateral lower extremity pain and edema for the past 3 years. History of pulmonary embolism. Evaluate for acute or chronic DVT. EXAM: BILATERAL LOWER EXTREMITY VENOUS DOPPLER ULTRASOUND TECHNIQUE: Gray-scale sonography with  graded compression, as well as color Doppler and duplex ultrasound were performed to evaluate the lower extremity deep venous systems from the level of the common femoral vein and including the common femoral, femoral, profunda femoral, popliteal and calf veins including the posterior tibial, peroneal and gastrocnemius veins when visible. The superficial great saphenous vein was also interrogated. Spectral Doppler was utilized to evaluate flow at rest and with distal augmentation  maneuvers in the common femoral, femoral and popliteal veins. COMPARISON:  None. FINDINGS: RIGHT LOWER EXTREMITY Common Femoral Vein: No evidence of thrombus. Normal compressibility, respiratory phasicity and response to augmentation. Saphenofemoral Junction: No evidence of thrombus. Normal compressibility and flow on color Doppler imaging. Profunda Femoral Vein: No evidence of thrombus. Normal compressibility and flow on color Doppler imaging. Femoral Vein: No evidence of thrombus. Normal compressibility, respiratory phasicity and response to augmentation. Popliteal Vein: No evidence of thrombus. Normal compressibility, respiratory phasicity and response to augmentation. Calf Veins: No evidence of thrombus. Normal compressibility and flow on color Doppler imaging. Superficial Great Saphenous Vein: No evidence of thrombus. Normal compressibility. Venous Reflux:  None. Other Findings: Minimal amount of eccentric echogenic plaque is seen within the right common femoral artery (image 2 and 3). LEFT LOWER EXTREMITY Common Femoral Vein: No evidence of thrombus. Normal compressibility, respiratory phasicity and response to augmentation. Saphenofemoral Junction: No evidence of thrombus. Normal compressibility and flow on color Doppler imaging. Profunda Femoral Vein: No evidence of thrombus. Normal compressibility and flow on color Doppler imaging. Femoral Vein: No evidence of thrombus. Normal compressibility, respiratory phasicity and response to augmentation. Popliteal Vein: No evidence of thrombus. Normal compressibility, respiratory phasicity and response to augmentation. Calf Veins: No evidence of thrombus. Normal compressibility and flow on color Doppler imaging. Superficial Great Saphenous Vein: No evidence of thrombus. Normal compressibility. Venous Reflux:  None. Other Findings:  None. IMPRESSION: No evidence acute or chronic DVT within either lower extremity. Electronically Signed   By: Sandi Mariscal M.D.   On:  01/21/2019 15:08   Dg Abd Acute 2+v W 1v Chest  Result Date: 01/20/2019 CLINICAL DATA:  Abdominal pain, nausea, vomiting, severe watery diarrhea, fevers and decreased urinary output. Smoker. EXAM: DG ABDOMEN ACUTE W/ 1V CHEST COMPARISON:  Portable chest dated 03/19/2018, chest CTA dated 02/21/2018 and abdomen and pelvis CT dated 07/19/2017. FINDINGS: Normal sized heart. Interval 4.2 cm rounded mass-like density with poorly defined margins in the right mid lung on the frontal view of the chest. Moderate diffuse peribronchial thickening and accentuation of the interstitial markings with mild progression. Normal bowel gas pattern without free peritoneal air. Cholecystectomy clips. Minimal lumbar spine degenerative changes. IMPRESSION: 1. Interval 4.2 cm rounded mass-like density in the right mid lung. This is concerning for a primary lung neoplasm. A rounded area of pneumonia is less likely. Further evaluation with a chest CT with contrast is recommended. 2. Progressive bronchitic changes. 3. No acute abdominal abnormality. Electronically Signed   By: Claudie Revering M.D.   On: 01/20/2019 13:18    12-lead ECG SR, RBBB All prior EKG's in EPIC reviewed with no documented atrial fibrillation  Telemetry SR, one NSVT only  Assessment and Plan:  1. Cryptogenic stroke The patient presents with cryptogenic (watershed) stroke.  (PVD/patent carotid/subclavian bypass, not felt to contribute)The patient has a TEE planned for this AM.  She has picc placed this AM for IV antibiotics 2/2 necrtotic lung mass vs cancer.  She is afebrile, wbc wnl. Though despite not being an intravascular device, given plans to treat initially with  long term IV antibiotics would hold off on an implantable device (loop) and recommend starting with an event monitor.  Recommend f/u with Dr. Bronson Ing for this.  The patient is agreeable with this strategy  Ongoing potassium replacement deferred to medicine team   Please call with  questions.   Baldwin Jamaica, PA-C 01/23/2019  I have seen and examined this patient with Tommye Standard.  Agree with above, note added to reflect my findings.  On exam, RRR, no murmurs, lungs clear. Patietn admitted with cryptogenic stroke, found to have necrotic lung abscess. Patient to have TEE today to rule out endocarditis. Plan for likely monitor as outpatient as she has had palpitations in the past. Elonna Mcfarlane have her follow up with primary cardiology.    Eather Chaires M. Vickki Igou MD 01/23/2019 12:13 PM

## 2019-01-23 NOTE — Care Management Note (Signed)
Case Management Note  Patient Details  Name: Rita [name] MRN: [id number] Date of Birth: December 07, 1966  Subjective/Objective:     Pt admitted with a stroke. She is from home with her spouse.  DME: none PCP: [facility] [address] No issues obtaining her meds.  No issues with transportation.               Action/Plan: No f/u per PT/OT and no DME needs. CM following for further needs at d/c.  Expected Discharge Date:  01/22/19               Expected Discharge Plan:  Home/Self Care  In-House Referral:     Discharge planning Services     Post Acute Care Choice:    Choice offered to:     DME Arranged:    DME Agency:     HH Arranged:    HH Agency:     Status of Service:  In process, will continue to follow  If discussed at Long Length of Stay Meetings, dates discussed:    Additional Comments:  Pollie Friar, RN 01/23/2019, 12:06 PM

## 2019-01-23 NOTE — Progress Notes (Addendum)
STROKE TEAM PROGRESS NOTE   INTERVAL HISTORY Her RN is at the bedside.  She is stable, still with hemianopsia, no weakness. Plans for TEE today. Discussed possible loop with EP - will hold for now given likely plans for abx at d/c for infection. They prefer to avoid implantation in setting of active infection. Can do 30d monitor and consider loop in the future if needed.   Vitals:   01/22/19 2107 01/23/19 0035 01/23/19 0433 01/23/19 0738  BP:  (!) 121/59 116/71 (!) 144/92  Pulse:  64 67 67  Resp:  17 17 18   Temp:  98.2 F (36.8 C) 98.3 F (36.8 C) 97.9 F (36.6 C)  TempSrc:  Oral Oral Oral  SpO2: 98% 100% 100% 100%  Weight:      Height:        CBC:  Recent Labs  Lab 01/22/19 0427 01/23/19 0502  WBC 8.5 9.7  HGB 10.8* 11.9*  HCT 33.6* 35.6*  MCV 88.2 86.8  PLT 472* 501*    Basic Metabolic Panel:  Recent Labs  Lab 01/20/19 1021  01/22/19 0427 01/23/19 0502  NA 137   < > 141 141  K 2.9*   < > 2.7* 3.3*  CL 96*   < > 108 106  CO2 25   < > 24 24  GLUCOSE 167*   < > 123* 101*  BUN 12   < > 8 11  CREATININE 0.74   < > 0.61 0.81  CALCIUM 9.3   < > 8.1* 8.7*  MG 1.9  --   --   --    < > = values in this interval not displayed.   Lipid Panel:     Component Value Date/Time   CHOL 134 01/21/2019 0505   TRIG 106 01/21/2019 0505   HDL 30 (L) 01/21/2019 0505   CHOLHDL 4.5 01/21/2019 0505   VLDL 21 01/21/2019 0505   LDLCALC 83 01/21/2019 0505   LDLCALC 171 (H) 01/17/2018 1153   HgbA1c:  Lab Results  Component Value Date   HGBA1C 5.7 (H) 01/21/2019   Urine Drug Screen:     Component Value Date/Time   LABOPIA POSITIVE (A) 12/10/2010 1431   COCAINSCRNUR POSITIVE (A) 12/10/2010 1431   LABBENZ POSITIVE (A) 12/10/2010 1431   AMPHETMU NONE DETECTED 12/10/2010 1431   THCU NONE DETECTED 12/10/2010 1431   LABBARB  12/10/2010 1431    NONE DETECTED        DRUG SCREEN FOR MEDICAL PURPOSES ONLY.  IF CONFIRMATION IS NEEDED FOR ANY PURPOSE, NOTIFY LAB WITHIN 5 DAYS.         LOWEST DETECTABLE LIMITS FOR URINE DRUG SCREEN Drug Class       Cutoff (ng/mL) Amphetamine      1000 Barbiturate      200 Benzodiazepine   098 Tricyclics       119 Opiates          300 Cocaine          300 THC              50    Alcohol Level     Component Value Date/Time   ETH <10 02/21/2018 1725    IMAGING US Venous Img Lower Bilateral  Result Date: 01/21/2019 CLINICAL DATA:  Bilateral lower extremity pain and edema for the past 3 years. History of pulmonary embolism. Evaluate for acute or chronic DVT. EXAM: BILATERAL LOWER EXTREMITY VENOUS DOPPLER ULTRASOUND TECHNIQUE: Gray-scale sonography with graded compression, as well as  color Doppler and duplex ultrasound were performed to evaluate the lower extremity deep venous systems from the level of the common femoral vein and including the common femoral, femoral, profunda femoral, popliteal and calf veins including the posterior tibial, peroneal and gastrocnemius veins when visible. The superficial great saphenous vein was also interrogated. Spectral Doppler was utilized to evaluate flow at rest and with distal augmentation maneuvers in the common femoral, femoral and popliteal veins. COMPARISON:  None. FINDINGS: RIGHT LOWER EXTREMITY Common Femoral Vein: No evidence of thrombus. Normal compressibility, respiratory phasicity and response to augmentation. Saphenofemoral Junction: No evidence of thrombus. Normal compressibility and flow on color Doppler imaging. Profunda Femoral Vein: No evidence of thrombus. Normal compressibility and flow on color Doppler imaging. Femoral Vein: No evidence of thrombus. Normal compressibility, respiratory phasicity and response to augmentation. Popliteal Vein: No evidence of thrombus. Normal compressibility, respiratory phasicity and response to augmentation. Calf Veins: No evidence of thrombus. Normal compressibility and flow on color Doppler imaging. Superficial Great Saphenous Vein: No evidence of thrombus.  Normal compressibility. Venous Reflux:  None. Other Findings: Minimal amount of eccentric echogenic plaque is seen within the right common femoral artery (image 2 and 3). LEFT LOWER EXTREMITY Common Femoral Vein: No evidence of thrombus. Normal compressibility, respiratory phasicity and response to augmentation. Saphenofemoral Junction: No evidence of thrombus. Normal compressibility and flow on color Doppler imaging. Profunda Femoral Vein: No evidence of thrombus. Normal compressibility and flow on color Doppler imaging. Femoral Vein: No evidence of thrombus. Normal compressibility, respiratory phasicity and response to augmentation. Popliteal Vein: No evidence of thrombus. Normal compressibility, respiratory phasicity and response to augmentation. Calf Veins: No evidence of thrombus. Normal compressibility and flow on color Doppler imaging. Superficial Great Saphenous Vein: No evidence of thrombus. Normal compressibility. Venous Reflux:  None. Other Findings:  None. IMPRESSION: No evidence acute or chronic DVT within either lower extremity. Electronically Signed   By: Sandi Mariscal M.D.   On: 01/21/2019 15:08   [place] Ekg Site Rite  Result Date: 01/22/2019 If Site Rite image not attached, placement could not be confirmed due to current cardiac rhythm.  2D Echocardiogram  - Left ventricle: The cavity size was normal. Wall thickness was   increased in a pattern of moderate LVH. Systolic function was   normal. The estimated ejection fraction was in the range of 60%   to 65%. Wall motion was normal; there were no regional wall   motion abnormalities. Left ventricular diastolic function   parameters were normal. - Aortic valve: Mildly calcified annulus. Trileaflet; mildly   thickened leaflets. Valve area (VTI): 1.94 cm^2. Valve area   (Vmax): 1.9 cm^2. - Left atrium: The atrium was mildly dilated. - Atrial septum: No defect or patent foramen ovale was identified.   PHYSICAL EXAM Constitutional: Appears  well-developed and well-nourished.  Psych: Affect appropriate to situation Eyes: No scleral injection HENT: No OP obstrucion Head: Normocephalic.  Cardiovascular: Normal rate and regular rhythm.  Respiratory: Effort normal, non-labored breathing Skin: WDI  Neuro: Mental Status: Patient is awake, alert, oriented to person, place, month, able to name objects, and situation. Patient is able to give a clear and coherent history. No signs of aphasia or neglect Cranial Nerves: II: Right hemianopsia III,IV, VI: EOMI without ptosis or diploplia. Pupils are equal, round, and reactive to light.   V: Facial sensation is symmetric to temperature VII: Facial movement is symmetric.  mild right nasolabial fold decrease VIII: hearing is intact to voice X: Uvula elevates symmetrically XI: Shoulder shrug is  symmetric. XII: tongue is midline without atrophy or fasciculations.  Motor: Tone is normal. Bulk is normal. 5/5 strength on the left with 5/5 on the right Sensory: Decrease sensation on the right arm and leg to touch/temperature Plantars: Toes are downgoing bilaterally.  Cerebellar: FNF normal   ASSESSMENT/PLAN Ms. CARLYLE ACHENBACH is a 53 y.o. female with history of tobacco use, hypertension, hypercholesterolemia, depression, Barrett's esophagus presenting to [facility] with nausea, vomiting and abnormal gait x 3 weeks. Found to have a R hemianopsia, R NL fold decrease and decreased sensation on the R.  Stroke:  Multiple left brain infarcts (occipital and ACA/MCA watershed) embolic secondary to unknown source  CT head acute/subacute L P-O jxn infarct. Minimal petechial HT. Mild edema but no mass effect.   MRI  Acute/subacute L occipital lobe infarct. L ACA/MCA watershed infarcts. Mild small vessel disease.   MRA head & neck  L VA occluded. Mod RM1 stenosis. Mod L A1 stenosis. Diffuse SVD COW. Mild BA and R>L PCA irregularity - L VA not related to current stroke and typically 1 VA dominant, and may  not be closed, just no flow seen, so this may be normal variant. R M1 not symptomatic or related to current infarct.  2D Echo  EF 60-65%. No source of embolus   LE doppler negative  TEE to look for embolic source. Arranged with [place] today (she is a work in, no specific scheduled time) She is NPO w/ sips w/ meds.  Discussed loop with [place] EP - will not place loop now given likely plans for abx at d/c for infection. They prefer to avoid implantation in setting of active infection. Can do 30d monitor and consider loop in the future if needed.   LDL 83  HgbA1c 5.7  HIV negative   Hypercoagulable panel pending   Heparin 5000 units sq tid for VTE prophylaxis  aspirin 81 mg daily prior to admission, now on aspirin 81 mg daily and plavix. Recommend DAPT for 3 weeks and then either ASA or plavix alone.   Therapy recommendations:  No PT needs. Will have OT eval given visual deficit.  Disposition:  pending   Patient advised not to drive at this time  Hypertension  Elevated 160-180s on arrival, stable now . BP goal normotensive  Hyperlipidemia  Home meds:  lipitor 40  lipitor increased to 80   LDL 83, goal < 70  Continue statin at discharge  Other Stroke Risk Factors  Cigarette smoker, advised to stop smoking  Overweight, Body mass index is 27.97 kg/m., recommend weight loss, diet and exercise as appropriate   Family hx stroke (mother)  L ICA / SCA bypass in March 2019 by Dr. Oneida Alar. This is not felt to be related to current stroke. Do not recommend vascular involvement.   Other Active Problems  Hx Barrett's esophagus  Gastroenteritis   R LL PNA vs CA - on abx. Ok for OP treatment, OP bx if needed (no need to do as an IP from stroke perspective. If turns out to be lung cancer, could be related to current stroke)  Hospital day # [place], MSN, APRN, ANVP-BC, AGPCNP-BC Advanced Practice Stroke  Nurse [place] for Schedule & Pager information 01/23/2019 10:17 AM   ATTENDING NOTE: I reviewed above note and agree with the assessment and plan. Pt was seen and examined.   53 year old female with history of chronic pancreatitis, alcohol use, hypertension,  hyperlipidemia, COPD, smoker, barrettes esophagitis, left CCA subclavian bypass in 02/2018 admitted for gait disturbance, nausea vomiting, right hemianopia present numbness and right facial droop.  CT showed left posterior inferior MCA infarct.  MRI confirmed left posterior inferior MCA infarct but also found to have left MCA/ACA scattered infarcts.  MRA head and neck left VA occlusion, right M1 left A1 stenosis.  EF 60 to 60%.  DVT negative.  LDL 83 and A1c 5.7.  Had a TEE today which was unremarkable.  Hypercoagulable work-up pending.  Recommend to continue aspirin 81 and Plavix for 3 weeks and then either aspirin or Plavix alone.  Increase Lipitor from 40 to 80 for HLD and stroke prevention.  Patient does complain of intermittent heart palpitation with dizziness and SOB.  EP consulted and felt the patient would be a candidate for 30-day cardiac event monitoring as outpatient before considering loop recorder.  Patient also found to have right lower lobe masslike lesion, infectious versus malignancy.  Patient was put on Unasyn and plan for discharge with Augmentin.  She will follow-up with pulmonology and consider biopsy if needed.  Neurology will sign off. Please call with questions. Pt will follow up with stroke clinic NP at [facility] in about 4 weeks. Thanks for the consult.   Rosalin Hawking, MD PhD Stroke Neurology 01/23/2019 6:04 PM   I spent  35 minutes in total face-to-face time with the patient, more than 50% of which was spent in counseling and coordination of care, reviewing test results, images and medication, and discussing the diagnosis of embolic stroke, right lower lung mass, treatment plan and potential  prognosis. This patient's care requiresreview of multiple databases, neurological assessment, discussion with family, other specialists and medical decision making of high complexity.    To contact Stroke Continuity provider, please refer to [email or web address] After hours, contact General Neurology

## 2019-01-23 NOTE — Anesthesia Preprocedure Evaluation (Signed)
Anesthesia Evaluation  Patient identified by MRN, date of birth, ID band Patient awake    Reviewed: Allergy & Precautions, NPO status , Patient's Chart, lab work & pertinent test results  History of Anesthesia Complications Negative for: history of anesthetic complications  Airway Mallampati: II  TM Distance: >3 FB Neck ROM: Full    Dental  (+) Poor Dentition   Pulmonary COPD, Current Smoker,    Pulmonary exam normal        Cardiovascular hypertension, + Peripheral Vascular Disease  Normal cardiovascular exam Rhythm:Regular Rate:Normal     Neuro/Psych PSYCHIATRIC DISORDERS Depression CVA    GI/Hepatic Neg liver ROS, hiatal hernia, PUD, GERD  ,  Endo/Other  negative endocrine ROS  Renal/GU negative Renal ROS  negative genitourinary   Musculoskeletal negative musculoskeletal ROS (+)   Abdominal   Peds  Hematology negative hematology ROS (+)   Anesthesia Other Findings Pt with cryptogenic strokes, for TEE to look for source  Reproductive/Obstetrics                             Anesthesia Physical Anesthesia Plan  ASA: III  Anesthesia Plan: MAC   Post-op Pain Management:    Induction: Intravenous  PONV Risk Score and Plan: 1 and Propofol infusion, TIVA and Treatment may vary due to age or medical condition  Airway Management Planned: Nasal Cannula and Simple Face Mask  Additional Equipment: None  Intra-op Plan:   Post-operative Plan:   Informed Consent: I have reviewed the patients History and Physical, chart, labs and discussed the procedure including the risks, benefits and alternatives for the proposed anesthesia with the patient or authorized representative who has indicated his/her understanding and acceptance.       Plan Discussed with:   Anesthesia Plan Comments:         Anesthesia Quick Evaluation

## 2019-01-23 NOTE — Consult Note (Addendum)
    [facility] has been requested to perform a transesophageal echocardiogram on Rita [name] for cryptogenic stroks.  After careful review of history and examination, the risks and benefits of transesophageal echocardiogram have been explained including risks of esophageal damage, perforation (1:10,000 risk), bleeding, pharyngeal hematoma as well as other potential complications associated with conscious sedation including aspiration, arrhythmia, respiratory failure and death. Alternatives to treatment were discussed, questions were answered. Patient is willing to proceed.   The patient confirms diagnosis of COPD, Barrett's esophagus/esophagitis history, she reports having a number of EGD's historically without difficulty.  She denies any active issues/trouble swallowing.  She is agreeable to proceed with TEEE  Baldwin Jamaica, PA-C  01/23/2019 11:26 AM   [name] Curt Bears, MD

## 2019-01-23 NOTE — Transfer of Care (Signed)
Immediate Anesthesia Transfer of Care Note  Patient: Rita [name]  Procedure(s) Performed: TRANSESOPHAGEAL ECHOCARDIOGRAM (TEE) (N/A ) BUBBLE STUDY  Patient Location: Endoscopy Unit  Anesthesia Type:MAC  Level of Consciousness: awake, alert  and oriented  Airway & Oxygen Therapy: Patient Spontanous Breathing and Patient connected to nasal cannula oxygen  Post-op Assessment: Report given to RN and Post -op Vital signs reviewed and stable  Post vital signs: Reviewed and stable  Last Vitals:  Vitals Value Taken Time  BP    Temp    Pulse 72 01/23/2019  1:38 PM  Resp    SpO2 98 % 01/23/2019  1:38 PM  Vitals shown include unvalidated device data.  Last Pain:  Vitals:   01/23/19 1229  TempSrc: Oral  PainSc:       Patients Stated Pain Goal: 5 (15/17/61 6073)  Complications: No apparent anesthesia complications

## 2019-01-23 NOTE — Progress Notes (Signed)
  Echocardiogram Echocardiogram Transesophageal has been performed.  Rita [name] 01/23/2019, 1:45 PM

## 2019-01-24 LAB — ANCA TITERS
Atypical P-ANCA titer: <1:20 {titer}
C-ANCA: <1:20 {titer}
P-ANCA: <1:20 {titer}

## 2019-01-24 LAB — BASIC METABOLIC PANEL
Anion gap: 10 (ref 5–15)
BUN: 13 mg/dL (ref 6–20)
CO2: 27 mmol/L (ref 22–32)
Calcium: 8.1 mg/dL — ABNORMAL LOW (ref 8.9–10.3)
Chloride: 103 mmol/L (ref 98–111)
Creatinine, Ser: 0.67 mg/dL (ref 0.44–1.00)
GFR calc Af Amer: >60 mL/min (ref >60)
GFR calc non Af Amer: >60 mL/min (ref >60)
Glucose, Bld: 107 mg/dL — ABNORMAL HIGH (ref 70–99)
Potassium: 2.8 mmol/L — ABNORMAL LOW (ref 3.5–5.1)
Sodium: 140 mmol/L (ref 135–145)

## 2019-01-24 LAB — CBC
HCT: 33.7 % — ABNORMAL LOW (ref 36.0–46.0)
Hemoglobin: 11.1 g/dL — ABNORMAL LOW (ref 12.0–15.0)
MCH: 28.7 pg (ref 26.0–34.0)
MCHC: 32.9 g/dL (ref 30.0–36.0)
MCV: 87.1 fL (ref 80.0–100.0)
Platelets: 480 10*3/uL — ABNORMAL HIGH (ref 150–400)
RBC: 3.87 MIL/uL (ref 3.87–5.11)
RDW: 13.2 % (ref 11.5–15.5)
WBC: 9 10*3/uL (ref 4.0–10.5)
nRBC: 0 % (ref 0.0–0.2)

## 2019-01-24 LAB — ANTINUCLEAR ANTIBODIES, IFA: ANA Ab, IFA: NEGATIVE

## 2019-01-24 LAB — HOMOCYSTEINE: Homocysteine: 17.9 umol/L — ABNORMAL HIGH (ref 0.0–14.5)

## 2019-01-24 LAB — MPO/PR-3 (ANCA) ANTIBODIES: Myeloperoxidase Abs: <9 U/mL (ref 0.0–9.0)

## 2019-01-24 LAB — LUPUS ANTICOAGULANT PANEL
DRVVT: 40.5 s (ref 0.0–47.0)
PTT Lupus Anticoagulant: 34.9 s (ref 0.0–51.9)

## 2019-01-24 LAB — ANTI-DNA ANTIBODY, DOUBLE-STRANDED: ds DNA Ab: <1 IU/mL (ref 0–9)

## 2019-01-24 MED ORDER — AMOXICILLIN-POT CLAVULANATE 875-125 MG PO TABS: 1.0000 | ORAL_TABLET | Freq: Two times a day (BID) | ORAL | 0 refills | Status: AC

## 2019-01-24 MED ORDER — CLOPIDOGREL BISULFATE 75 MG PO TABS: 75.0000 mg | ORAL_TABLET | Freq: Every day | ORAL | 3 refills | Status: DC

## 2019-01-24 MED ORDER — SACCHAROMYCES BOULARDII 250 MG PO CAPS: 250.0000 mg | ORAL_CAPSULE | Freq: Two times a day (BID) | ORAL | Status: DC

## 2019-01-24 MED ORDER — ATORVASTATIN CALCIUM 80 MG PO TABS: 80.0000 mg | ORAL_TABLET | Freq: Every day | ORAL | 0 refills | Status: DC

## 2019-01-24 MED ORDER — POTASSIUM CHLORIDE CRYS ER 20 MEQ PO TBCR
40.0000 meq | EXTENDED_RELEASE_TABLET | Freq: Two times a day (BID) | ORAL | Status: DC
  Administered 2019-01-24: 40 meq via ORAL
  Filled 2019-01-24: qty 2

## 2019-01-24 NOTE — Progress Notes (Signed)
Occupational Therapy Evaluation Patient Details Name: Rita [name] MRN: [id number] DOB: 04-Dec-1966 Today's Date: 01/24/2019    History of Present Illness Pt is a 53 y/o female admitted secondary to nausea, vomitting and abnormal gait. MRI revealed Multiple left brain infarcts (occipital and ACA/MCA watershed) embolic secondary to unknown source. Incidental finding of R LL mass. PMH including but not limited to COPD and HTN.   Clinical Impression   PTA, pt independent with ADL, IADL and mobility, including driving. Pt presents with apparent R visual field cut, RUE sensory deficits, dysgraphia and apparent memory deficits. Educated pt on home safety as she states she is "running into things on her R". Discussed recommendations to have a full [employer] Assement completed by her eye doctor to further assess her B R field cut. Pt was educated on need to refrain from driving. Recommend follow up with outpt OT. Pt verbalized understanding of recommendations. Pt asked for therapist to review information with her boyfriend when he comes to pick her up.     Follow Up Recommendations  Outpatient OT;Supervision - Intermittent    Equipment Recommendations  None recommended by OT    Recommendations for Other Services       Precautions / Restrictions Precautions Precautions: None Precaution Comments: fall Restrictions Weight Bearing Restrictions: No      Mobility Bed Mobility Overal bed mobility: Modified Independent                Transfers Overall transfer level: Needs assistance Equipment used: None Transfers: Sit to/from Stand Sit to Stand: Supervision         General transfer comment: for safety    Balance Overall balance assessment: Needs assistance Sitting-balance support: Feet supported Sitting balance-Leahy Scale: Good     Standing balance support: No upper extremity supported Standing balance-Leahy Scale: Fair                             ADL  either performed or assessed with clinical judgement   ADL Overall ADL's : Needs assistance/impaired     Grooming: Set up   Upper Body Bathing: Set up;Sitting   Lower Body Bathing: Supervison/ safety;Sit to/from stand   Upper Body Dressing : Set up   Lower Body Dressing: Set up;Sit to/from stand   Toilet Transfer: Supervision/safety   Toileting- Water quality scientist and Hygiene: Modified independent       Functional mobility during ADLs: Supervision/safety General ADL Comments: Pt states she "runs into things on the right". Educated on home safety and environament modifications to increase safety with mobility and reduce risk of falls; Educated on keeping items in consistent places to reduce frustration of losing itmes     Vision Baseline Vision/History: No visual deficits Patient Visual Report: (Pt reports she "needs glasses"; wears readers) Vision Assessment?: Yes Eye Alignment: Within Functional Limits Ocular Range of Motion: Within Functional Limits Alignment/Gaze Preference: Within Defined Limits Tracking/Visual Pursuits: Decreased smoothness of horizontal tracking;Decreased smoothness of vertical tracking Saccades: Additional eye shifts occurred during testing Convergence: Within functional limits Visual Fields: Right homonymous hemianopsia Depth Perception: Undershoots Additional Comments: Does not see therapist's face in her R visual field "I only see your left eye"; appeasr to have macular sparing     Perception Perception Perception Tested?: Yes Comments: Demonstrates difficulty with writing numbers; writing imparied   Praxis      Pertinent Vitals/Pain Pain Assessment: Faces Faces Pain Scale: Hurts little more Pain Location: back Pain Descriptors /  Indicators: Sore;Discomfort Pain Intervention(s): Limited activity within patient's tolerance     Hand Dominance Right   Extremity/Trunk Assessment Upper Extremity Assessment Upper Extremity Assessment:  RUE deficits/detail RUE Deficits / Details: genralized weakness but funcitonal; dropping items frequently; impaired sensation RUE Sensation: decreased light touch   Lower Extremity Assessment Lower Extremity Assessment: Defer to PT evaluation   Cervical / Trunk Assessment Cervical / Trunk Assessment: Normal   Communication Communication Communication: No difficulties   Cognition Arousal/Alertness: Awake/alert Behavior During Therapy: WFL for tasks assessed/performed Overall Cognitive Status: Impaired/Different from baseline Area of Impairment: Memory                               General Comments: unable to recall her phone number; unable to recall boyfriend's phone number - frustrating to pt   General Comments       Exercises     Shoulder Instructions      Home Living Family/patient expects to be discharged to:: Private residence Living Arrangements: Spouse/significant other Available Help at Discharge: Friend(s);Available PRN/intermittently Type of Home: House Home Access: Stairs to enter [employer] of Steps: 1   Home Layout: One level     Bathroom Shower/Tub: Occupational psychologist: Standard Bathroom Accessibility: Yes How Accessible: Accessible via walker Home Equipment: (Has shower chair she can borrow)          Prior Functioning/Environment Level of Independence: Independent        Comments: drives, does not work        OT Problem List: Decreased strength;Impaired balance (sitting and/or standing);Impaired vision/perception;Decreased coordination;Decreased cognition;Impaired UE functional use;Pain;Decreased knowledge of use of DME or AE;Impaired sensation      OT Treatment/Interventions: Self-care/ADL training;Therapeutic exercise;DME and/or AE instruction;Therapeutic activities;Cognitive remediation/compensation;Visual/perceptual remediation/compensation;Patient/family education;Balance training    OT  Goals(Current goals can be found in the care plan section) Acute Rehab OT Goals Patient Stated Goal: to be able to see  OT Goal Formulation: With patient Time For Goal Achievement: 01/31/19 Potential to Achieve Goals: Good  OT Frequency: Min 3X/week   Barriers to D/C:            Co-evaluation              AM-PAC OT "6 Clicks" Daily Activity     Outcome Measure Help from another person eating meals?: None Help from another person taking care of personal grooming?: None Help from another person toileting, which includes using toliet, bedpan, or urinal?: None Help from another person bathing (including washing, rinsing, drying)?: None Help from another person to put on and taking off regular upper body clothing?: None Help from another person to put on and taking off regular lower body clothing?: None 6 Click Score: 24   End of Session Nurse Communication: Mobility status;Other (comment)(DC needs)  Activity Tolerance: Patient tolerated treatment well Patient left: in bed;with call bell/phone within reach  OT Visit Diagnosis: Low vision, both eyes (H54.2);Other symptoms and signs involving cognitive function;Muscle weakness (generalized) (M62.81)                Time: 3614-4315 OT Time Calculation (min): 27 min Charges:  OT General Charges $OT Visit: 1 Visit OT Evaluation $OT Eval Moderate Complexity: 1 Mod OT Treatments $Self Care/Home Management : 8-22 mins  Maurie Boettcher, OT/L   Acute OT Clinical Specialist [place] Pager [phone] Office [phone]   [facility] 01/24/2019, 2:21 PM

## 2019-01-24 NOTE — Progress Notes (Signed)
Pharmacy Antibiotic Note  Rita [name] is a 53 y.o. female admitted on 01/20/2019 with pneumonia.  Pharmacy has been consulted for unasyn dosing. R-lower lobe lung mass found with Pulm plan to continue abx with further workup.  SCr 0.67 and remains stable, AF and WBC wnl.    Plan: Continue Unasyn 3g IV every 6 hours Monitor renal function, LOT  Height: 5\' 6"  (167.6 cm) Weight: 173 lb 4.5 oz (78.6 kg) IBW/kg (Calculated) : 59.3  Temp (24hrs), Avg:98.3 F (36.8 C), Min:97.9 F (36.6 C), Max:98.7 F (37.1 C)  Recent Labs  Lab 01/20/19 1021 01/21/19 0505 01/22/19 0427 01/23/19 0502 01/24/19 0515  WBC 11.3*  --  8.5 9.7 9.0  CREATININE 0.74 0.64 0.61 0.81 0.67    Estimated Creatinine Clearance: 86 mL/min (by C-G formula based on SCr of 0.67 mg/dL).    No Known Allergies  Antimicrobials this admission: 1/26 unasyn >>  1/26 doxycycline >> 1/27  Microbiology results: 1/27 BCx: ngtd  Bertis Ruddy, PharmD Clinical Pharmacist Please check AMION for all [place] numbers 01/24/2019 10:02 AM

## 2019-01-24 NOTE — Progress Notes (Signed)
OT Treatment Note  Session focused on pt/family education  Regarding R visual field deficits, home safety; medication and financial management; precautions due to RUE sensory deficits and need to refrain from driving.Educated on need to have Full Engineer, technical sales. Pt/caregiver verbalized understanding. Pt to continue with outpt OT.     01/24/19 1600  OT Visit Information  Last OT Received On 01/24/19  Assistance Needed +1  History of Present Illness Pt is a 53 y/o female admitted secondary to nausea, vomitting and abnormal gait. MRI revealed Multiple left brain infarcts (occipital and ACA/MCA watershed) embolic secondary to unknown source. Incidental finding of R LL mass. PMH including but not limited to COPD and HTN.  Pain Assessment  Pain Assessment No/denies pain  Cognition  Arousal/Alertness Awake/alert  Behavior During Therapy WFL for tasks assessed/performed  Overall Cognitive Status Impaired/Different from baseline  General Comments  General comments (skin integrity, edema, etc.) Education with significant other regarding home safety and reducing risk of falls. Educated on R field cut and impact on function. Educated on need for visual field test. Educated on activities to increase scanning (sticky note activity; letter cancellation; word search).   OT - End of Session  Activity Tolerance Patient tolerated treatment well  Patient left in bed;with call bell/phone within reach;with family/visitor present  Nurse Communication Mobility status  OT Assessment/Plan  OT Plan Discharge plan remains appropriate  OT Visit Diagnosis Low vision, both eyes (H54.2);Other symptoms and signs involving cognitive function;Muscle weakness (generalized) (M62.81)  OT Frequency (ACUTE ONLY) Min 3X/week  Follow Up Recommendations Outpatient OT;Supervision - Intermittent  OT Equipment None recommended by OT  AM-PAC OT "6 Clicks" Daily Activity Outcome Measure (Version 2)  Help from another person  eating meals? 4  Help from another person taking care of personal grooming? 4  Help from another person toileting, which includes using toliet, bedpan, or urinal? 4  Help from another person bathing (including washing, rinsing, drying)? 4  Help from another person to put on and taking off regular upper body clothing? 4  Help from another person to put on and taking off regular lower body clothing? 4  6 Click Score 24  Acute Rehab OT Goals  Patient Stated Goal to be able to see   OT Goal Formulation With patient  Time For Goal Achievement 01/31/19  Potential to Achieve Goals Good  OT Time Calculation  OT Start Time (ACUTE ONLY) 1510  OT Stop Time (ACUTE ONLY) 1530  OT Time Calculation (min) 20 min  OT General Charges  $OT Visit 1 Visit  OT Treatments  $Self Care/Home Management  8-22 mins  Maurie Boettcher, OT/L   Acute OT Clinical Specialist [place] Pager [phone] Office [phone]

## 2019-01-24 NOTE — Evaluation (Signed)
Physical Therapy Evaluation Patient Details Name: Rita [name] MRN: [id number] DOB: 1966-05-11 Today's Date: 01/24/2019   History of Present Illness  Pt is a 53 y/o female admitted secondary to nausea, vomitting and abnormal gait. MRI revealed Multiple left brain infarcts (occipital and ACA/MCA watershed) embolic secondary to unknown source. Incidental finding of R LL mass. PMH including but not limited to COPD and HTN.    Clinical Impression  Pt presented supine in bed with HOB elevated, awake and willing to participate in therapy session. Prior to admission, pt reported that she was independent with all functional mobility and ADLs. Pt lives with her fiance in a single level home with one step to enter. She is currently mod I for bed mobility, supervision for transfers and min guard to min A for ambulation in hallway without use of an AD. Pt participated in a higher level balance assessment and scored a 17/24 on the DGI, indicating that she is at an increased risk for falls. Therefore, recommending OP PT to address her higher level balance deficits. Pt in agreement. PT will continue to follow acutely to progress mobility as tolerated.     Follow Up Recommendations Outpatient PT    Equipment Recommendations  None recommended by PT    Recommendations for Other Services       Precautions / Restrictions Precautions Precautions: None Precaution Comments: R eye, R hemianopsia  Restrictions Weight Bearing Restrictions: No      Mobility  Bed Mobility Overal bed mobility: Modified Independent                Transfers Overall transfer level: Needs assistance Equipment used: None Transfers: Sit to/from Stand Sit to Stand: Supervision         General transfer comment: for safety  Ambulation/Gait Ambulation/Gait assistance: Min guard;Min assist Gait Distance (Feet): 300 Feet Assistive device: None Gait Pattern/deviations: Step-through pattern;Decreased stride length;Drifts  right/left Gait velocity: able to fluctuate   General Gait Details: pt with steady ambulation over even surfaces with no challenges to balance; however with some higher level balance tasks, pt requiring min A (see DGI results)  Stairs Stairs: Yes Stairs assistance: Min guard Stair Management: Two rails;Step to pattern;Forwards Number of Stairs: 2 General stair comments: min guard for safety  Wheelchair Mobility    Modified Rankin (Stroke Patients Only) Modified Rankin (Stroke Patients Only) Pre-Morbid Rankin Score: No symptoms Modified Rankin: Slight disability     Balance Overall balance assessment: Needs assistance Sitting-balance support: Feet supported Sitting balance-Leahy Scale: Good     Standing balance support: No upper extremity supported Standing balance-Leahy Scale: Fair                   Standardized Balance Assessment Standardized Balance Assessment : Dynamic Gait Index   Dynamic Gait Index Level Surface: Normal Change in Gait Speed: Normal Gait with Horizontal Head Turns: Normal Gait with Vertical Head Turns: Moderate Impairment Gait and Pivot Turn: Mild Impairment Step Over Obstacle: Mild Impairment Step Around Obstacles: Mild Impairment Steps: Moderate Impairment Total Score: 17       Pertinent Vitals/Pain Pain Assessment: 0-10 Pain Score: 9  Pain Location: back Pain Descriptors / Indicators: Sore Pain Intervention(s): Monitored during session;Repositioned    Home Living Family/patient expects to be discharged to:: Private residence Living Arrangements: Spouse/significant other Available Help at Discharge: Friend(s);Available PRN/intermittently Type of Home: House Home Access: Stairs to enter   Entrance Stairs-Number of Steps: 1 Home Layout: One level Home Equipment: None  Prior Function Level of Independence: Independent         Comments: drives, does not work     Journalist, newspaper   Dominant Hand: Right     Extremity/Trunk Assessment   Upper Extremity Assessment Upper Extremity Assessment: RUE deficits/detail RUE Deficits / Details: MMT revealed 3+/5 for shoulder flexion, 4/5 for elbow flexion, 4/5 for elbow extension and decreased grip strength as compared to L. Pt also with decreased sensation to light touch throughout, worsening proximally    Lower Extremity Assessment Lower Extremity Assessment: RLE deficits/detail RLE Deficits / Details: MMT revealed 3+/5 for hip flexion, 4/5 for knee flexion, 3+/5 for knee extension and 4/5 for ankle DF. Pt with reported decreased sensation to light touch throughout as compared to R LE.       Communication   Communication: No difficulties  Cognition Arousal/Alertness: Awake/alert Behavior During Therapy: WFL for tasks assessed/performed Overall Cognitive Status: Within Functional Limits for tasks assessed                                        General Comments      Exercises     Assessment/Plan    PT Assessment Patient needs continued PT services  PT Problem List Decreased strength;Decreased activity tolerance;Decreased balance;Decreased mobility;Decreased coordination;Decreased knowledge of use of DME;Decreased safety awareness;Decreased knowledge of precautions       PT Treatment Interventions DME instruction;Gait training;Stair training;Functional mobility training;Therapeutic activities;Neuromuscular re-education;Therapeutic exercise;Balance training;Patient/family education    PT Goals (Current goals can be found in the Care Plan section)  Acute Rehab PT Goals Patient Stated Goal: to return to independence PT Goal Formulation: With patient Time For Goal Achievement: 02/07/19 Potential to Achieve Goals: Good    Frequency Min 4X/week   Barriers to discharge        Co-evaluation               AM-PAC PT "6 Clicks" Mobility  Outcome Measure Help needed turning from your back to your side while in a flat  bed without using bedrails?: None Help needed moving from lying on your back to sitting on the side of a flat bed without using bedrails?: None Help needed moving to and from a bed to a chair (including a wheelchair)?: None Help needed standing up from a chair using your arms (e.g., wheelchair or bedside chair)?: None Help needed to walk in hospital room?: A Little Help needed climbing 3-5 steps with a railing? : A Little 6 Click Score: 22    End of Session Equipment Utilized During Treatment: Gait belt Activity Tolerance: Patient tolerated treatment well Patient left: in bed;with call bell/phone within reach Nurse Communication: Mobility status PT Visit Diagnosis: Other abnormalities of gait and mobility (R26.89);Other symptoms and signs involving the nervous system (R29.898)    Time: 0940-1003 PT Time Calculation (min) (ACUTE ONLY): 23 min   Charges:   PT Evaluation $PT Eval Moderate Complexity: 1 Mod PT Treatments $Gait Training: 8-22 mins        Sherie Don, [place], DPT  Acute Rehabilitation Services Pager [phone] Office [place] 01/24/2019, 10:25 AM

## 2019-01-24 NOTE — Anesthesia Postprocedure Evaluation (Signed)
Anesthesia Post Note  Patient: RONI FRIBERG  Procedure(s) Performed: TRANSESOPHAGEAL ECHOCARDIOGRAM (TEE) (N/A ) BUBBLE STUDY     Patient location during evaluation: Endoscopy Anesthesia Type: MAC Level of consciousness: awake and alert Pain management: pain level controlled Vital Signs Assessment: post-procedure vital signs reviewed and stable Respiratory status: spontaneous breathing, nonlabored ventilation and respiratory function stable Cardiovascular status: blood pressure returned to baseline and stable Postop Assessment: no apparent nausea or vomiting Anesthetic complications: no    Last Vitals:  Vitals:   01/24/19 0854 01/24/19 1222  BP: 110/79 130/81  Pulse: 66 67  Resp: 17 20  Temp: 36.9 C 36.8 C  SpO2: 97% 99%    Last Pain:  Vitals:   01/24/19 1222  TempSrc: Oral  PainSc:    Pain Goal: Patients Stated Pain Goal: 5 (01/24/19 0759)                 Lidia Collum

## 2019-01-24 NOTE — Progress Notes (Signed)
Nutrition Follow-up  DOCUMENTATION CODES:   Not applicable  INTERVENTION:  Continue Boost Breeze po TID, each supplement provides 250 kcal and 9 grams of protein.  Continue 30 ml Prostat po BID, each supplement provides 100 kcal and 15 grams of protein.   Encourage adequate PO intake.   NUTRITION DIAGNOSIS:   Inadequate oral intake related to acute illness, vomiting(history of swallow problem) as evidenced by per patient/family report(-clear liquid diet ); diet advanced; intake improved  GOAL:   Patient will meet greater than or equal to 90% of their needs; met  MONITOR:   PO intake, Supplement acceptance, Weight trends, TF tolerance, Skin, Other (Comment)  REASON FOR ASSESSMENT:   Malnutrition Screening Tool    ASSESSMENT:   Patient is a 53 yo female with hx of peptic ulcer, GERD, Hiatal hernia, Chronic abdominal pain, barrett's esophagus, COPD, Chronic pancreatitis. Patient complains of loose stools and vomiting prior to admission. Gastroenteritis and left sided CVA (MRI) and CT of chest shows abnormal finding right upper lobe. Patient says she is being transferred to [facility].  Meal completion has been 50-100%. Pt does report some nausea during time of visit and requests anti-nausea medication, RN aware. Pt reports appetite and intake has been well with no other difficulties. Pt currently has Boost Breeze and Prostat ordered and has been consuming them. Recommend continuation of nutritional supplements post discharge especially if po intake becomes inadequate. Labs and medications reviewed.   Diet Order:   Diet Order            Diet - low sodium heart healthy        Diet Heart Room service appropriate? Yes; Fluid consistency: Thin  Diet effective now              EDUCATION NEEDS:   No education needs have been identified at this time  Skin:  Skin Assessment: Reviewed RN Assessment  Last BM:  1/26  Height:   Ht Readings from Last 1 Encounters:  01/23/19  _0  (1.676 m)    Weight:   Wt Readings from Last 1 Encounters:  01/23/19 78.6 kg    Ideal Body Weight:  59 kg  BMI:  Body mass index is 27.97 kg/m.  Estimated Nutritional Needs:   Kcal:  4492-0100 (24-26 kcal/kg/bw)  Protein:  90-100 gr  Fluid:  >1800 ml daily    Corrin Parker, MS, RD, LDN Pager # [phone] After hours/ weekend pager # [phone]

## 2019-01-24 NOTE — Care Management Important Message (Signed)
Important Message  Patient Details  Name: Rita [name] MRN: [id number] Date of Birth: Jan 19, 1966   Medicare Important Message Given:  Yes    Savanna Dooley 01/24/2019, 3:20 PM

## 2019-01-24 NOTE — Care Management Note (Signed)
Case Management Note  Patient Details  Name: Rita [name] MRN: [id number] Date of Birth: 05/17/1966  Subjective/Objective:                    Action/Plan: CM consulted for Outpatient therapy. CM met with the patient and she would like to attend in [place]. Orders in Epic and information on the AVS. Pt states she has transportation home when medically ready for d/c.   Expected Discharge Date:  01/24/19               Expected Discharge Plan:  OP Rehab  In-House Referral:     Discharge planning Services  CM Consult  Post Acute Care Choice:    Choice offered to:     DME Arranged:    DME Agency:     HH Arranged:    HH Agency:     Status of Service:  Completed, signed off  If discussed at Long Length of Stay Meetings, dates discussed:    Additional Comments:  Kelli F Willard, RN 01/24/2019, 11:05 AM  

## 2019-01-25 ENCOUNTER — Encounter (HOSPITAL_COMMUNITY): Payer: Self-pay | Admitting: Cardiology

## 2019-01-25 LAB — CARDIOLIPIN ANTIBODIES, IGG, IGM, IGA
Anticardiolipin IgA: <9 APL U/mL (ref 0–11)
Anticardiolipin IgG: <9 GPL U/mL (ref 0–14)
Anticardiolipin IgM: <9 MPL U/mL (ref 0–12)

## 2019-01-25 LAB — BETA-2-GLYCOPROTEIN I ABS, IGG/M/A
Beta-2 Glyco I IgG: <9 GPI IgG units (ref 0–20)
Beta-2-Glycoprotein I IgM: <9 GPI IgM units (ref 0–32)

## 2019-01-25 NOTE — Discharge Summary (Addendum)
Physician Discharge Summary  SIGNE TACKITT YBO:[id number] DOB: February 04, 1966 DOA: 01/20/2019  PCP: System, Pcp Not In  Admit date: 01/20/2019 Discharge date: 01/24/2019  Admitted From: Home  Disposition:  Home   Recommendations for Outpatient Follow-up:  1. Follow up with PCP in 1 week 2. Please obtain BMP/CBC in one week 3. Follow up with Dr. Luan Pulling in 4-6 weeks 4. Please obtain CT chest in 6 weeks to re-evaluate RLL mass 5. Follow up with Neurology in 4 weeks  Home Health: Yes  Equipment/Devices: None  Discharge Condition: Good  CODE STATUS: FULL Diet recommendation: Cardiac  Brief/Interim Summary: Mrs. Swalley is a 53 y.o. F with hx PVD s/p carotid-subclavian L bypass last year, chronic pain, chronic pancreatitis, depression and COPD who presented with 3 weeks vomiting, diarrhea, found incidentally to have right lower lobe mass and right sided paresthesias and hemianopsia.  Evaluated by Pulmonology at [facility], then transferred to [facility] for stroke work up and TEE.     PRINCIPAL HOSPITAL DIAGNOSIS: Acute ischemic stroke    Discharge Diagnoses:   Acute left ACA/MCA/PCA watershed distribution ischemic stroke MRI showed scattered acute nonhemorrhagic strokes in the left ACA, MCA and PCA distribution watersheds.  Non-invasive angiography showed moderate intracranial atherosclerosis.  Echocardiogram showed no cardiogenic source of embolism.  Carotid imaging unremarkable.  Her Lipitor was increased, aspirin and Plavix continued.  Atrial fibrillation was absent from monitoring.  tPA not given because of presentation outside the window.  Dysphagia screen ordered in ER.  PT and OT ordered and recommended outpatient OT.  Smoking cessation: recommended.   Right lower lobe lung mass The patient was noted to have a mass in the upper portion of the right lower lobe.  This was thought to be either necrotic mass/malignancy or more likely infection/abscess.  She was evaluated at [facility] by Dr. Luan Pulling,  treated with Unasyn for 3 days.  At discharge, after discussion with Dr. Luan Pulling, we agreed on plan to treat for 6 weeks with Augmentin and repeat imaging.  Dr. Luan Pulling will coordinate biopsy if the mass is not resolved.  Gastroenteritis Nausea and vomiting completely resolved.  Chronic pain  Moderate protein calorie malnutrition  Right sided dysesthesias Patient also notes at least 9 months of right sided "pain" since immediately after her surgery.  She describes a dysesthesia in which, briefly, and without provocation, exertion or movement (often while just sitting quietly), she feels like "my whole right side is falling off".  This is sometimes a few times a day, sometimes a few times a month.  It was worse in the last few days here in the hospital, but preceded this, and began to occur after her carotid bypass 9 months ago.  She has never attempted to discuss with Dr. Oneida Alar.          Discharge Instructions  Discharge Instructions    Ambulatory referral to Neurology   Complete by:  As directed    Follow up with stroke clinic NP (Jessica Vanschaick or Cecille Rubin, if both not available, consider Zachery Dauer, or Ahern) at [facility] in about 4 weeks. Thanks.   Ambulatory referral to Occupational Therapy   Complete by:  As directed    Ambulatory referral to Physical Therapy   Complete by:  As directed    Diet - low sodium heart healthy   Complete by:  As directed    Discharge instructions   Complete by:  As directed    From Dr. Loleta Books: You were admitted for feeling tired and having  nausea and vomiting.   You were found to have a lung mass and a stroke.  The lung mass may just be an infection: For this, take Augmentin 875-125 mg twice daily (morning and night every day) for 6 weeks Call Dr. Miguel Rota office in [place] [phone] and ask for an appointment in 4 to 6 weeks, let them know Dr. Luan Pulling saw you in the hospital at [facility]  While you are taking the  antibiotic Augmentin, take a probiotic daily (this is healthy bacteria to replace the bacteria killed by Augmentin)  It is extremely important that you see Dr. Luan Pulling, so he can order repeat imaging of your lung to make sure this mass is gone.  If it is not completely gone, would recommend that he biopsy it to make sure it is not cancer.   For the stroke: Increase your cholesterol medicine (Lipitor) dose to 80 mg nightly Continue your baby aspirin 81 mg daily but add the other medicine clopidogrel/Plavix 75 mg daily for three months In three months, stop either the aspirin or the Plavix, but continue the other  Follow up with Neurology as directed Ask your primary care doctor to refer you to physical therapy   While your vision is recovering, you may not drive a car. Do not drive until you have been cleared by a neurologist. They may recommend that you see an eye specialist for formal visual field testing.   Increase activity slowly   Complete by:  As directed      Allergies as of 01/24/2019   No Known Allergies     Medication List    STOP taking these medications   escitalopram 10 MG tablet Commonly known as:  LEXAPRO   metoCLOPramide 5 MG tablet Commonly known as:  REGLAN   ondansetron 4 MG tablet Commonly known as:  ZOFRAN   traZODone 50 MG tablet Commonly known as:  DESYREL     TAKE these medications   amLODipine 5 MG tablet Commonly known as:  NORVASC Take 1 tablet (5 mg total) by mouth daily. What changed:    when to take this  Another medication with the same name was removed. Continue taking this medication, and follow the directions you see here.   amoxicillin-clavulanate 875-125 MG tablet Commonly known as:  AUGMENTIN Take 1 tablet by mouth 2 (two) times daily.   aspirin EC 81 MG tablet Take 1 tablet (81 mg total) by mouth daily. What changed:  when to take this   atorvastatin 80 MG tablet Commonly known as:  LIPITOR Take 1 tablet (80 mg total)  by mouth daily at 6 PM. What changed:    medication strength  how much to take  when to take this   Buprenorphine HCl-Naloxone HCl 8-2 MG Film Take 1 Film by mouth daily. 1/2 film under the tongue   clopidogrel 75 MG tablet Commonly known as:  PLAVIX Take 1 tablet (75 mg total) by mouth daily.   dexlansoprazole 60 MG capsule Commonly known as:  DEXILANT Take 1 capsule (60 mg total) by mouth at bedtime.   Oxycodone HCl 10 MG Tabs Take 1 tablet (10 mg total) by mouth every 6 (six) hours as needed.   potassium chloride SA 20 MEQ tablet Commonly known as:  K-DUR,KLOR-CON Take 20 mEq by mouth at bedtime.   saccharomyces boulardii 250 MG capsule Commonly known as:  FLORASTOR Take 1 capsule (250 mg total) by mouth 2 (two) times daily.  Follow-up Information    [facility]. Schedule an appointment as soon as possible for a visit in 4 week(s).   Specialty:  Neurology Contact information: [address] [place] Follow up.   Specialty:  Rehabilitation Why:  They will contact you for the first appointment Contact information: [place] [phone] [place] [phone]         No Known Allergies  Consultations:  Neurology  Pulmonology   Procedures/Studies: Ct Head Wo Contrast  Result Date: 01/20/2019 CLINICAL DATA:  Right-sided visual disturbance. EXAM: CT HEAD WITHOUT CONTRAST TECHNIQUE: Contiguous axial images were obtained from the base of the skull through the vertex without intravenous contrast. COMPARISON:  11/29/2009 FINDINGS: Brain: There is acute/subacute infarction in the left parieto-occipital junction region. There may be minimal petechial blood products but there is no frank hematoma. Mild swelling but no significant mass effect or shift. The remainder the brain appears negative.  Vascular: No significant vascular finding. Ordinary atherosclerotic calcification in the carotid siphon regions. No finding on this noncontrast study to suggest venous thrombosis. Skull: Normal Sinuses/Orbits: Clear/normal Other: None IMPRESSION: Acute/subacute infarction in the left parieto-occipital junction region. Minimal petechial blood products but no frank hematoma. Mild swelling but no significant mass effect or shift. Electronically Signed   By: Nelson Chimes M.D.   On: 01/20/2019 19:15   Ct Chest W Contrast  Result Date: 01/20/2019 CLINICAL DATA:  Nausea and vomiting with pain, initial encounter EXAM: CT CHEST, ABDOMEN, AND PELVIS WITH CONTRAST TECHNIQUE: Multidetector CT imaging of the chest, abdomen and pelvis was performed following the standard protocol during bolus administration of intravenous contrast. CONTRAST:  176mL ISOVUE-300 IOPAMIDOL (ISOVUE-300) INJECTION 61% COMPARISON:  Plain film from earlier in the same day as well as CT from 02/21/2018 FINDINGS: CT CHEST FINDINGS Cardiovascular: Thoracic aorta is within normal limits. No cardiac enlargement is seen. The pulmonary artery as visualized is within normal limits. 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 Opacification of the more distal subclavian artery is noted secondary to a left carotid to subclavian bypass. Mediastinum/Nodes: Thoracic inlet is within normal limits.The esophagus demonstrates some diffuse wall thickening likely related to underlying reflux. The overall appearance is stable from the prior exam. No left hilar adenopathy is noted. No significant mediastinal adenopathy is noted. Scattered small right hilar lymph nodes are seen likely reactive in nature. Lungs/Pleura: Lungs are well aerated bilaterally. A few scattered calcified granulomas are seen. In the superior segment of the right lower lobe, there is a focal 3.8 x 4.3 cm mass lesion with central decreased  attenuation identified consistent with fluid. Some suggestion of air within the central portion of the lesion is noted on the coronal and sagittal reconstructions . The lungs are otherwise clear. No sizable effusion is noted. Musculoskeletal: No acute bony abnormality is noted. CT ABDOMEN PELVIS FINDINGS Hepatobiliary: Fatty infiltration of the liver is noted. The gallbladder has been surgically removed. Pancreas: Unremarkable. No pancreatic ductal dilatation or surrounding inflammatory changes. Spleen: Normal in size without focal abnormality. Adrenals/Urinary Tract: Adrenal glands are within normal limits. Kidneys are well visualized bilaterally without renal calculi or obstructive changes. Ureters are within normal limits. The bladder is partially distended. Stomach/Bowel: The appendix is within normal limits. No obstructive or inflammatory changes of the bowel are seen. Small sliding-type hiatal hernia is noted as well  as the aforementioned esophageal thickening distally consistent with reflux disease. Vascular/Lymphatic: Aortic atherosclerosis. No enlarged abdominal or pelvic lymph nodes. Reproductive: Uterus and bilateral adnexa are unremarkable. Other: No abdominal wall hernia or abnormality. No abdominopelvic ascites. Musculoskeletal: Degenerative changes of the lumbar spine are noted. IMPRESSION: Rounded masslike density in the superior segment of the right lower lobe as described. There is central decreased attenuation as well as a few foci of what appears to be air within on the sagittal and coronal reconstructions. Although this could represent a necrotic neoplasm, short-term treatment for necrotic pneumonia/lung abscess is recommended with repeat imaging to assess for resolution. If the lesion persists on subsequent imaging more aggressive workup can be performed. Chronic changes as described above. Electronically Signed   By: Inez Catalina M.D.   On: 01/20/2019 15:56   Mr Jodene Nam Head Wo  Contrast  Result Date: 01/21/2019 CLINICAL DATA:  Headache and weakness. Abnormal CT scan. Visual field deficits for 1 week. Gait abnormalities. Headaches. EXAM: MRI HEAD WITHOUT CONTRAST MRA HEAD WITHOUT CONTRAST MRA NECK WITHOUT AND WITH CONTRAST TECHNIQUE: Multiplanar, multiecho pulse sequences of the brain and surrounding structures were obtained without contrast. Angiographic images of the Circle of Willis were obtained using MRA technique without intravenous contrast. Angiographic images of the neck were obtained using MRA technique without and with intravenous contrast. Carotid stenosis measurements (when applicable) are obtained utilizing NASCET criteria, using the distal internal carotid diameter as the denominator. CONTRAST:  7 mL Gadavist COMPARISON:  CT head without contrast 01/20/2019 FINDINGS: MRI HEAD FINDINGS Brain: A left MCA/PCA acute/subacute watershed infarct is confirmed. Additional foci of restricted diffusion extend over the convexity in the left MCA/ACA watershed territory. There is minimal, if any, involvement of the precentral gyrus. T2 signal changes are associated with the areas of acute/subacute infarction, compatible with a time frame over the last week. Minimal subcortical T2 changes are present in the right frontal lobe and more anterior left frontal lobe. Basal ganglia are intact. Insert normal IAC's The brainstem and cerebellum are within normal limits. Vascular: Flow is present in the major intracranial arteries. Skull and upper cervical spine: The craniocervical junction is normal. Upper cervical spine is within normal limits. Marrow signal is unremarkable. Sinuses/Orbits: Mild mucosal thickening is present within the ethmoid air cells. The paranasal sinuses and mastoid air cells are otherwise clear. MRA HEAD FINDINGS Atherosclerotic irregularity is present within the cavernous internal carotid arteries bilaterally. There is a moderate stenosis in the distal left A1 segment. The  right A1 segments is normal. The left M1 segment is normal. There is a moderate stenosis of the distal right M1 segment with significant signal loss. Distal small vessel disease is present in the MCA distributions bilaterally. No other significant proximal disease is present. There is no flow in the vertebral artery apart from minimal signal is proximal to the vertebrobasilar junction. There is a moderate stenosis of the residual right vertebral artery at the PICA. Irregularity is present throughout the basilar artery without a significant stenosis. There is mild irregularity of the proximal PCA vessels bilaterally without significant stenosis. Right greater than left distal PCA branch vessel attenuation is present. MRA NECK FINDINGS The time-of-flight images demonstrate no significant flow disturbance at either carotid bifurcation. There is no antegrade flow signal in the left vertebral artery. The right common carotid artery is within normal limits. There is moderate stenosis at the right external carotid artery. No significant stenosis is present in the right ICA through the ICA terminus. The left common  carotid artery is within normal limits. Bifurcation is unremarkable. There is no significant stenosis in the left ICA through the terminus. There is moderate narrowing at the origin of the dominant right vertebral artery. Additional narrowing is present at the dural margin of the right vertebral artery. Distal left vertebral artery occlusion is present. There is some signal in the left vertebral artery to the level of the dura. IMPRESSION: 1. Acute/subacute nonhemorrhagic left occipital lobe infarct. This may be a lateral PCA or watershed infarct. 2. Additional acute/subacute nonhemorrhagic left ACA/MCA watershed infarcts over the left convexity. 3. Subcortical white matter disease is mildly advanced for age. This likely reflects the sequela of chronic microvascular ischemia. 4. Occlusion of the distal left  vertebral artery. 5. Contrast in the left vertebral artery proximal to the dural segment. Limited time-of-flight signal suggests slow or reversed flow. 6. Moderate distal right M1 segment stenosis. 7. Moderate distal left A1 segment stenosis. 8. Diffuse small vessel disease present on the MRA circle-of-Willis. 9. Mild irregularity in the basilar artery and proximal PCA vessels bilaterally with asymmetric attenuation of distal branches, right greater than left. Electronically Signed   By: San Morelle M.D.   On: 01/21/2019 09:07   Mr Jodene Nam Neck W Wo Contrast  Result Date: 01/21/2019 CLINICAL DATA:  Headache and weakness. Abnormal CT scan. Visual field deficits for 1 week. Gait abnormalities. Headaches. EXAM: MRI HEAD WITHOUT CONTRAST MRA HEAD WITHOUT CONTRAST MRA NECK WITHOUT AND WITH CONTRAST TECHNIQUE: Multiplanar, multiecho pulse sequences of the brain and surrounding structures were obtained without contrast. Angiographic images of the Circle of Willis were obtained using MRA technique without intravenous contrast. Angiographic images of the neck were obtained using MRA technique without and with intravenous contrast. Carotid stenosis measurements (when applicable) are obtained utilizing NASCET criteria, using the distal internal carotid diameter as the denominator. CONTRAST:  7 mL Gadavist COMPARISON:  CT head without contrast 01/20/2019 FINDINGS: MRI HEAD FINDINGS Brain: A left MCA/PCA acute/subacute watershed infarct is confirmed. Additional foci of restricted diffusion extend over the convexity in the left MCA/ACA watershed territory. There is minimal, if any, involvement of the precentral gyrus. T2 signal changes are associated with the areas of acute/subacute infarction, compatible with a time frame over the last week. Minimal subcortical T2 changes are present in the right frontal lobe and more anterior left frontal lobe. Basal ganglia are intact. Insert normal IAC's The brainstem and cerebellum  are within normal limits. Vascular: Flow is present in the major intracranial arteries. Skull and upper cervical spine: The craniocervical junction is normal. Upper cervical spine is within normal limits. Marrow signal is unremarkable. Sinuses/Orbits: Mild mucosal thickening is present within the ethmoid air cells. The paranasal sinuses and mastoid air cells are otherwise clear. MRA HEAD FINDINGS Atherosclerotic irregularity is present within the cavernous internal carotid arteries bilaterally. There is a moderate stenosis in the distal left A1 segment. The right A1 segments is normal. The left M1 segment is normal. There is a moderate stenosis of the distal right M1 segment with significant signal loss. Distal small vessel disease is present in the MCA distributions bilaterally. No other significant proximal disease is present. There is no flow in the vertebral artery apart from minimal signal is proximal to the vertebrobasilar junction. There is a moderate stenosis of the residual right vertebral artery at the PICA. Irregularity is present throughout the basilar artery without a significant stenosis. There is mild irregularity of the proximal PCA vessels bilaterally without significant stenosis. Right greater than left distal  PCA branch vessel attenuation is present. MRA NECK FINDINGS The time-of-flight images demonstrate no significant flow disturbance at either carotid bifurcation. There is no antegrade flow signal in the left vertebral artery. The right common carotid artery is within normal limits. There is moderate stenosis at the right external carotid artery. No significant stenosis is present in the right ICA through the ICA terminus. The left common carotid artery is within normal limits. Bifurcation is unremarkable. There is no significant stenosis in the left ICA through the terminus. There is moderate narrowing at the origin of the dominant right vertebral artery. Additional narrowing is present at the  dural margin of the right vertebral artery. Distal left vertebral artery occlusion is present. There is some signal in the left vertebral artery to the level of the dura. IMPRESSION: 1. Acute/subacute nonhemorrhagic left occipital lobe infarct. This may be a lateral PCA or watershed infarct. 2. Additional acute/subacute nonhemorrhagic left ACA/MCA watershed infarcts over the left convexity. 3. Subcortical white matter disease is mildly advanced for age. This likely reflects the sequela of chronic microvascular ischemia. 4. 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 5. Contrast in the left vertebral artery proximal to the dural segment. Limited time-of-flight signal suggests slow or reversed flow. 6. Moderate distal right M1 segment stenosis. 7. Moderate distal left A1 segment stenosis. 8. Diffuse small vessel disease present on the MRA circle-of-Willis. 9. Mild irregularity in the basilar artery and proximal PCA vessels bilaterally with asymmetric attenuation of distal branches, right greater than left. Electronically Signed   By: San Morelle M.D.   On: 01/21/2019 09:07   Mr Brain Wo Contrast  Result Date: 01/21/2019 CLINICAL DATA:  Headache and weakness. Abnormal CT scan. Visual field deficits for 1 week. Gait abnormalities. Headaches. EXAM: MRI HEAD WITHOUT CONTRAST MRA HEAD WITHOUT CONTRAST MRA NECK WITHOUT AND WITH CONTRAST TECHNIQUE: Multiplanar, multiecho pulse sequences of the brain and surrounding structures were obtained without contrast. Angiographic images of the Circle of Willis were obtained using MRA technique without intravenous contrast. Angiographic images of the neck were obtained using MRA technique without and with intravenous contrast. Carotid stenosis measurements (when applicable) are obtained utilizing NASCET criteria, using the distal internal carotid diameter as the denominator. CONTRAST:  7 mL Gadavist COMPARISON:  CT head without contrast 01/20/2019 FINDINGS: MRI HEAD  FINDINGS Brain: A left MCA/PCA acute/subacute watershed infarct is confirmed. Additional foci of restricted diffusion extend over the convexity in the left MCA/ACA watershed territory. There is minimal, if any, involvement of the precentral gyrus. T2 signal changes are associated with the areas of acute/subacute infarction, compatible with a time frame over the last week. Minimal subcortical T2 changes are present in the right frontal lobe and more anterior left frontal lobe. Basal ganglia are intact. Insert normal IAC's The brainstem and cerebellum are within normal limits. Vascular: Flow is present in the major intracranial arteries. Skull and upper cervical spine: The craniocervical junction is normal. Upper cervical spine is within normal limits. Marrow signal is unremarkable. Sinuses/Orbits: Mild mucosal thickening is present within the ethmoid air cells. The paranasal sinuses and mastoid air cells are otherwise clear. MRA HEAD FINDINGS Atherosclerotic irregularity is present within the cavernous internal carotid arteries bilaterally. There is a moderate stenosis in the distal left A1 segment. The right A1 segments is normal. The left M1 segment is normal. There is a moderate stenosis of the distal right M1 segment with significant signal loss. Distal small vessel disease is present in the MCA distributions bilaterally. No other significant  proximal disease is present. There is no flow in the vertebral artery apart from minimal signal is proximal to the vertebrobasilar junction. There is a moderate stenosis of the residual right vertebral artery at the PICA. Irregularity is present throughout the basilar artery without a significant stenosis. There is mild irregularity of the proximal PCA vessels bilaterally without significant stenosis. Right greater than left distal PCA branch vessel attenuation is present. MRA NECK FINDINGS The time-of-flight images demonstrate no significant flow disturbance at either  carotid bifurcation. There is no antegrade flow signal in the left vertebral artery. The right common carotid artery is within normal limits. There is moderate stenosis at the right external carotid artery. No significant stenosis is present in the right ICA through the ICA terminus. The left common carotid artery is within normal limits. Bifurcation is unremarkable. There is no significant stenosis in the left ICA through the terminus. There is moderate narrowing at the origin of the dominant right vertebral artery. Additional narrowing is present at the dural margin of the right vertebral artery. Distal left vertebral artery occlusion is present. There is some signal in the left vertebral artery to the level of the dura. IMPRESSION: 1. Acute/subacute nonhemorrhagic left occipital lobe infarct. This may be a lateral PCA or watershed infarct. 2. Additional acute/subacute nonhemorrhagic left ACA/MCA watershed infarcts over the left convexity. 3. Subcortical white matter disease is mildly advanced for age. This likely reflects the sequela of chronic microvascular ischemia. 4. 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 5. Contrast in the left vertebral artery proximal to the dural segment. Limited time-of-flight signal suggests slow or reversed flow. 6. Moderate distal right M1 segment stenosis. 7. Moderate distal left A1 segment stenosis. 8. Diffuse small vessel disease present on the MRA circle-of-Willis. 9. Mild irregularity in the basilar artery and proximal PCA vessels bilaterally with asymmetric attenuation of distal branches, right greater than left. Electronically Signed   By: San Morelle M.D.   On: 01/21/2019 09:07   Ct Abdomen Pelvis W Contrast  Result Date: 01/20/2019 CLINICAL DATA:  Nausea and vomiting with pain, initial encounter EXAM: CT CHEST, ABDOMEN, AND PELVIS WITH CONTRAST TECHNIQUE: Multidetector CT imaging of the chest, abdomen and pelvis was performed following the standard  protocol during bolus administration of intravenous contrast. CONTRAST:  178mL ISOVUE-300 IOPAMIDOL (ISOVUE-300) INJECTION 61% COMPARISON:  Plain film from earlier in the same day as well as CT from 02/21/2018 FINDINGS: CT CHEST FINDINGS Cardiovascular: Thoracic aorta is within normal limits. No cardiac enlargement is seen. The pulmonary artery as visualized is within normal limits. 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 Opacification of the more distal subclavian artery is noted secondary to a left carotid to subclavian bypass. Mediastinum/Nodes: Thoracic inlet is within normal limits.The esophagus demonstrates some diffuse wall thickening likely related to underlying reflux. The overall appearance is stable from the prior exam. No left hilar adenopathy is noted. No significant mediastinal adenopathy is noted. Scattered small right hilar lymph nodes are seen likely reactive in nature. Lungs/Pleura: Lungs are well aerated bilaterally. A few scattered calcified granulomas are seen. In the superior segment of the right lower lobe, there is a focal 3.8 x 4.3 cm mass lesion with central decreased attenuation identified consistent with fluid. Some suggestion of air within the central portion of the lesion is noted on the coronal and sagittal reconstructions . The lungs are otherwise clear. No sizable effusion is noted. Musculoskeletal: No acute bony abnormality is noted.  CT ABDOMEN PELVIS FINDINGS Hepatobiliary: Fatty infiltration of the liver is noted. The gallbladder has been surgically removed. Pancreas: Unremarkable. No pancreatic ductal dilatation or surrounding inflammatory changes. Spleen: Normal in size without focal abnormality. Adrenals/Urinary Tract: Adrenal glands are within normal limits. Kidneys are well visualized bilaterally without renal calculi or obstructive changes. Ureters are within normal limits. The bladder is partially distended.  Stomach/Bowel: The appendix is within normal limits. No obstructive or inflammatory changes of the bowel are seen. Small sliding-type hiatal hernia is noted as well as the aforementioned esophageal thickening distally consistent with reflux disease. Vascular/Lymphatic: Aortic atherosclerosis. No enlarged abdominal or pelvic lymph nodes. Reproductive: Uterus and bilateral adnexa are unremarkable. Other: No abdominal wall hernia or abnormality. No abdominopelvic ascites. Musculoskeletal: Degenerative changes of the lumbar spine are noted. IMPRESSION: Rounded masslike density in the superior segment of the right lower lobe as described. There is central decreased attenuation as well as a few foci of what appears to be air within on the sagittal and coronal reconstructions. Although this could represent a necrotic neoplasm, short-term treatment for necrotic pneumonia/lung abscess is recommended with repeat imaging to assess for resolution. If the lesion persists on subsequent imaging more aggressive workup can be performed. Chronic changes as described above. Electronically Signed   By: Inez Catalina M.D.   On: 01/20/2019 15:56   US Venous Img Lower Bilateral  Result Date: 01/21/2019 CLINICAL DATA:  Bilateral lower extremity pain and edema for the past 3 years. History of pulmonary embolism. Evaluate for acute or chronic DVT. EXAM: BILATERAL LOWER EXTREMITY VENOUS DOPPLER ULTRASOUND TECHNIQUE: Gray-scale sonography with graded compression, as well as color Doppler and duplex ultrasound were performed to evaluate the lower extremity deep venous systems from the level of the common femoral vein and including the common femoral, femoral, profunda femoral, popliteal and calf veins including the posterior tibial, peroneal and gastrocnemius veins when visible. The superficial great saphenous vein was also interrogated. Spectral Doppler was utilized to evaluate flow at rest and with distal augmentation maneuvers in the  common femoral, femoral and popliteal veins. COMPARISON:  None. FINDINGS: RIGHT LOWER EXTREMITY Common Femoral Vein: No evidence of thrombus. Normal compressibility, respiratory phasicity and response to augmentation. Saphenofemoral Junction: No evidence of thrombus. Normal compressibility and flow on color Doppler imaging. Profunda Femoral Vein: No evidence of thrombus. Normal compressibility and flow on color Doppler imaging. Femoral Vein: No evidence of thrombus. Normal compressibility, respiratory phasicity and response to augmentation. Popliteal Vein: No evidence of thrombus. Normal compressibility, respiratory phasicity and response to augmentation. Calf Veins: No evidence of thrombus. Normal compressibility and flow on color Doppler imaging. Superficial Great Saphenous Vein: No evidence of thrombus. Normal compressibility. Venous Reflux:  None. Other Findings: Minimal amount of eccentric echogenic plaque is seen within the right common femoral artery (image 2 and 3). LEFT LOWER EXTREMITY Common Femoral Vein: No evidence of thrombus. Normal compressibility, respiratory phasicity and response to augmentation. Saphenofemoral Junction: No evidence of thrombus. Normal compressibility and flow on color Doppler imaging. Profunda Femoral Vein: No evidence of thrombus. Normal compressibility and flow on color Doppler imaging. Femoral Vein: No evidence of thrombus. Normal compressibility, respiratory phasicity and response to augmentation. Popliteal Vein: No evidence of thrombus. Normal compressibility, respiratory phasicity and response to augmentation. Calf Veins: No evidence of thrombus. Normal compressibility and flow on color Doppler imaging. Superficial Great Saphenous Vein: No evidence of thrombus. Normal compressibility. Venous Reflux:  None. Other Findings:  None. IMPRESSION: No evidence acute or chronic DVT within either lower  extremity. Electronically Signed   By: Sandi Mariscal M.D.   On: 01/21/2019 15:08    Dg Abd Acute 2+v W 1v Chest  Result Date: 01/20/2019 CLINICAL DATA:  Abdominal pain, nausea, vomiting, severe watery diarrhea, fevers and decreased urinary output. Smoker. EXAM: DG ABDOMEN ACUTE W/ 1V CHEST COMPARISON:  Portable chest dated 03/19/2018, chest CTA dated 02/21/2018 and abdomen and pelvis CT dated 07/19/2017. FINDINGS: Normal sized heart. Interval 4.2 cm rounded mass-like density with poorly defined margins in the right mid lung on the frontal view of the chest. Moderate diffuse peribronchial thickening and accentuation of the interstitial markings with mild progression. Normal bowel gas pattern without free peritoneal air. Cholecystectomy clips. Minimal lumbar spine degenerative changes. IMPRESSION: 1. Interval 4.2 cm rounded mass-like density in the right mid lung. This is concerning for a primary lung neoplasm. A rounded area of pneumonia is less likely. Further evaluation with a chest CT with contrast is recommended. 2. Progressive bronchitic changes. 3. No acute abdominal abnormality. Electronically Signed   By: Claudie Revering M.D.   On: 01/20/2019 13:18   [place] Ekg Site Rite  Result Date: 01/22/2019 If Site Rite image not attached, placement could not be confirmed due to current cardiac rhythm.      Subjective: Still some right sided vision changes, still some right sided fleeting discomforts, stable since her surgery 6 months ago.  No new slurred speech, seizure, confusion, fever.  Discharge Exam: Vitals:   01/24/19 0854 01/24/19 1222  BP: 110/79 130/81  Pulse: 66 67  Resp: 17 20  Temp: 98.4 F (36.9 C) 98.3 F (36.8 C)  SpO2: 97% 99%   Vitals:   01/23/19 2344 01/24/19 0351 01/24/19 0854 01/24/19 1222  BP: 104/67 (!) 115/52 110/79 130/81  Pulse: (!) 57 83 66 67  Resp: 18 18 17 20   Temp: 98.6 F (37 C) 98.3 F (36.8 C) 98.4 F (36.9 C) 98.3 F (36.8 C)  TempSrc: Oral Oral Oral Oral  SpO2: 100% 100% 97% 99%  Weight:      Height:        General: Pt is alert,  awake, not in acute distress Cardiovascular: RRR, nl S1-S2, no murmurs appreciated.   No LE edema.   Respiratory: Normal respiratory rate and rhythm.  CTAB without rales or wheezes. Abdominal: Abdomen soft and non-tender.  No distension or HSM.   Neuro/Psych: Strength symmetric in upper and lower extremities.  Judgment and insight appear normal.   The results of significant diagnostics from this hospitalization (including imaging, microbiology, ancillary and laboratory) are listed below for reference.     Microbiology: Recent Results (from the past 240 hour(s))  Culture, blood (Routine X 2) w Reflex to ID Panel     Status: None (Preliminary result)   Collection Time: 01/21/19  3:26 PM  Result Value Ref Range Status   Specimen Description RIGHT ANTECUBITAL  Final   Special Requests   Final    BOTTLES DRAWN AEROBIC AND ANAEROBIC Blood Culture adequate volume   Culture   Final    NO GROWTH 3 DAYS Performed at [facility], [address] [place], [place] [address]    Report Status PENDING  Incomplete     Labs: BNP (last 3 results) No results for input(s): BNP in the last 8760 hours. Basic Metabolic Panel: Recent Labs  Lab 01/20/19 1021 01/21/19 0505 01/22/19 0427 01/23/19 0502 01/24/19 0515  NA 137 140 141 141 140  K 2.9* 3.0* 2.7* 3.3* 2.8*  CL 96* 107 108  106 103  CO2 25 26 24 24 27   GLUCOSE 167* 97 123* 101* 107*  BUN 12 8 8 11 13   CREATININE 0.74 0.64 0.61 0.81 0.67  CALCIUM 9.3 8.2* 8.1* 8.7* 8.1*  MG 1.9  --   --   --   --    Liver Function Tests: Recent Labs  Lab 01/20/19 1021 01/22/19 0427  AST 18 11*  ALT 17 11  ALKPHOS 94 53  BILITOT 0.8 0.2*  PROT 8.0 5.7*  ALBUMIN 3.7 2.6*   Recent Labs  Lab 01/20/19 1021  LIPASE 57*   No results for input(s): AMMONIA in the last 168 hours. CBC: Recent Labs  Lab 01/20/19 1021 01/22/19 0427 01/23/19 0502 01/24/19 0515  WBC 11.3* 8.5 9.7 9.0  HGB 15.5* 10.8* 11.9* 11.1*  HCT 46.4* 33.6* 35.6* 33.7*  MCV  83.3 88.2 86.8 87.1  PLT 570* 472* 501* 480*   Cardiac Enzymes: No results for input(s): CKTOTAL, CKMB, CKMBINDEX, TROPONINI in the last 168 hours. BNP: Invalid input(s): POCBNP CBG: No results for input(s): GLUCAP in the last 168 hours. D-Dimer No results for input(s): DDIMER in the last 72 hours. Hgb A1c No results for input(s): HGBA1C in the last 72 hours. Lipid Profile No results for input(s): CHOL, HDL, LDLCALC, TRIG, CHOLHDL, LDLDIRECT in the last 72 hours. Thyroid function studies No results for input(s): TSH, T4TOTAL, T3FREE, THYROIDAB in the last 72 hours.  Invalid input(s): FREET3 Anemia work up No results for input(s): VITAMINB12, FOLATE, FERRITIN, TIBC, IRON, RETICCTPCT in the last 72 hours. Urinalysis    Component Value Date/Time   COLORURINE YELLOW 01/20/2019 1430   APPEARANCEUR CLEAR 01/20/2019 1430   LABSPEC 1.013 01/20/2019 1430   PHURINE 8.0 01/20/2019 1430   GLUCOSEU NEGATIVE 01/20/2019 1430   HGBUR NEGATIVE 01/20/2019 1430   BILIRUBINUR NEGATIVE 01/20/2019 1430   KETONESUR 5 (A) 01/20/2019 1430   PROTEINUR NEGATIVE 01/20/2019 1430   UROBILINOGEN 0.2 02/05/2015 2347   NITRITE NEGATIVE 01/20/2019 1430   LEUKOCYTESUR NEGATIVE 01/20/2019 1430   Sepsis Labs Invalid input(s): PROCALCITONIN,  WBC,  LACTICIDVEN Microbiology Recent Results (from the past 240 hour(s))  Culture, blood (Routine X 2) w Reflex to ID Panel     Status: None (Preliminary result)   Collection Time: 01/21/19  3:26 PM  Result Value Ref Range Status   Specimen Description RIGHT ANTECUBITAL  Final   Special Requests   Final    BOTTLES DRAWN AEROBIC AND ANAEROBIC Blood Culture adequate volume   Culture   Final    NO GROWTH 3 DAYS Performed at [facility], [address], [place], [place] [address]    Report Status PENDING  Incomplete     Time coordinating discharge: 40 minutes       SIGNED:   Edwin Dada, MD  [facility] 01/24/2019, 6:00 PM

## 2019-01-26 LAB — CULTURE, BLOOD (ROUTINE X 2)
CULTURE: NO GROWTH
Special Requests: ADEQUATE

## 2019-02-06 DIAGNOSIS — F17200 Nicotine dependence, unspecified, uncomplicated: Secondary | ICD-10-CM | POA: Diagnosis not present

## 2019-02-06 DIAGNOSIS — J189 Pneumonia, unspecified organism: Secondary | ICD-10-CM | POA: Diagnosis not present

## 2019-02-06 DIAGNOSIS — J449 Chronic obstructive pulmonary disease, unspecified: Secondary | ICD-10-CM | POA: Diagnosis not present

## 2019-02-06 DIAGNOSIS — R079 Chest pain, unspecified: Secondary | ICD-10-CM | POA: Diagnosis not present

## 2019-02-13 ENCOUNTER — Ambulatory Visit (HOSPITAL_COMMUNITY): Payer: Medicare Other | Attending: Family Medicine

## 2019-02-13 ENCOUNTER — Telehealth (HOSPITAL_COMMUNITY): Payer: Self-pay

## 2019-02-13 ENCOUNTER — Ambulatory Visit (HOSPITAL_COMMUNITY): Payer: Medicare Other | Admitting: Occupational Therapy

## 2019-02-13 ENCOUNTER — Other Ambulatory Visit: Payer: Self-pay

## 2019-02-13 DIAGNOSIS — I639 Cerebral infarction, unspecified: Secondary | ICD-10-CM | POA: Insufficient documentation

## 2019-02-13 DIAGNOSIS — M6281 Muscle weakness (generalized): Secondary | ICD-10-CM | POA: Insufficient documentation

## 2019-02-13 NOTE — Telephone Encounter (Signed)
L/m for pt to call us back to confrim this apptment and let us know if she wants to r/s OT. NF 02/13/2019

## 2019-02-13 NOTE — Patient Instructions (Signed)
Step 1  Step 2  Step 3  Sit to Stand reps: 10  sets: 2-3  daily: 1  weekly: 7 Setup  Begin sitting upright with your feet flat on the ground underneath your knees. Movement  Move your shoulders and head over your toes, bring your knees forward, and allow your hips to come off the chair, then push down equally into both feet to stand up. Sit back down and repeat.  Tip  Make sure to keep your weight evenly distributed between both legs, and try to keep your back straight throughout the exercise. Do not lock out your knees once you are standing. Step 1  Step 2  Standing Hip Extension with Counter Support reps: 10  sets: 2-3  hold: 3 seconds  daily: 1  weekly: 7 Setup  Begin in a standing upright position with your hands resting on a counter. Movement  Tighten your buttock muscles and slowly lift your leg backward. Return to the starting position and repeat. Tip  Make sure to keep your moving leg straight and keep your shoulders and hips facing forward during the exercise. Use the counter to help you balance as needed. Step 1  Step 2  Standing Hip Abduction with Counter Support reps: 10  sets: 2-3  hold: 3 seconds  daily: 1  weekly: 7 Setup  Begin in a standing upright position with your hands resting on a counter. Movement  Lift your leg out to your side, then return to the starting position and repeat.  Tip  Make sure to keep your moving leg straight and do not bend or rotate your trunk during the exercise. Use the counter to help you balance as needed.

## 2019-02-13 NOTE — Therapy (Signed)
Rita [name], [place], [address] Phone: [phone]   Fax:  [phone]  Physical Therapy Evaluation  Patient Details  Name: Rita [name] MRN: [id number] Date of Birth: 02-15-1966 Referring Provider (PT): Rita Books Suann Larry, MD   Encounter Date: 02/13/2019  PT End of Session - 02/13/19 4967    Visit Number  1    Number of Visits  12    Date for PT Re-Evaluation  03/27/19    Authorization Type  Medicare Part A and B (no auth requried, no visit limit)    Authorization Time Period  02/13/2019 - 03/29/2019    Authorization - Visit Number  1    Authorization - Number of Visits  10    PT Start Time  1608    PT Stop Time  1646    PT Time Calculation (min)  38 min    Activity Tolerance  Patient tolerated treatment well    Behavior During Therapy  [facility] for tasks assessed/performed       Past Medical History:  Diagnosis Date  . Arthritis    "qwhere" (02/22/2018)  . Barrett's esophagus with esophagitis 03/26/2013  . Chronic abdominal pain   . Chronic lower back pain   . Chronic pancreatitis ([place])   . COPD (chronic obstructive pulmonary disease) ([place])   . Depression   . Dyspnea   . GERD (gastroesophageal reflux disease)   . Heart murmur   . Hiatal hernia   . High cholesterol   . Hypertension   . Peptic ulcer   . Pneumonia 2000s X 1  . Tobacco use 03/26/2013    Past Surgical History:  Procedure Laterality Date  . AORTIC ARCH ANGIOGRAPHY N/A 01/19/2018   Procedure: AORTIC ARCH ANGIOGRAPHY;  Surgeon: Elam Dutch, MD;  Location: [facility];  Service: Cardiovascular;  Laterality: N/A;  . BIOPSY  04/17/2017   Procedure: BIOPSY;  Surgeon: Daneil Dolin, MD;  [facility];  Service: Endoscopy;;  esophageal  . CARDIAC CATHETERIZATION    . CAROTID-SUBCLAVIAN BYPASS GRAFT Left 03/19/2018   Procedure: BYPASS GRAFT CAROTID-SUBCLAVIAN;  Surgeon: Elam Dutch, MD;  Location: [facility] OR;  Service:  Vascular;  Laterality: Left;  . [place]; 1988  . COLONOSCOPY WITH PROPOFOL N/A 04/17/2017   Procedure: COLONOSCOPY WITH PROPOFOL;  Surgeon: Daneil Dolin, MD;  [facility];  Service: Endoscopy;  Laterality: N/A;  100  . ESOPHAGOGASTRODUODENOSCOPY  Oct 2011   Dr. Laural Golden: ulcer in distal esophagus, soft stricture at GE junction s/p balloon dilation PATH: BARRETT'S  . ESOPHAGOGASTRODUODENOSCOPY (EGD) WITH ESOPHAGEAL DILATION N/A 03/27/2013   Procedure: ESOPHAGOGASTRODUODENOSCOPY (EGD) WITH ESOPHAGEAL DILATION;  Surgeon: Daneil Dolin, MD;  [facility];  Service: Endoscopy;  Laterality: N/A;  possible dilation  . ESOPHAGOGASTRODUODENOSCOPY (EGD) WITH PROPOFOL N/A 01/02/2017   Procedure: ESOPHAGOGASTRODUODENOSCOPY (EGD) WITH PROPOFOL;  Surgeon: Daneil Dolin, MD;  [facility];  Service: Endoscopy;  Laterality: N/A;  with possible esophageal dilation  . ESOPHAGOGASTRODUODENOSCOPY (EGD) WITH PROPOFOL N/A 04/17/2017   Procedure: ESOPHAGOGASTRODUODENOSCOPY (EGD) WITH PROPOFOL;  Surgeon: Daneil Dolin, MD;  [facility];  Service: Endoscopy;  Laterality: N/A;  . EUS N/A 05/09/2013   Procedure: UPPER ENDOSCOPIC ULTRASOUND (EUS) LINEAR;  Surgeon: Milus Banister, MD;  Location: [facility];  Service: Endoscopy;  Laterality: N/A;  . FRACTURE SURGERY     pt. denies  . KNEE ARTHROSCOPY Right   . LAPAROSCOPIC  CHOLECYSTECTOMY    . LEFT HEART CATH AND CORONARY ANGIOGRAPHY N/A 02/26/2018   Procedure: LEFT HEART CATH AND CORONARY ANGIOGRAPHY;  Surgeon: Burnell Blanks, MD;  Location: [facility];  Service: Cardiovascular;  Laterality: N/A;  . Venia Minks DILATION N/A 04/17/2017   Procedure: Venia Minks DILATION;  Surgeon: Daneil Dolin, MD;  [facility];  Service: Endoscopy;  Laterality: N/A;  . PATELLA FRACTURE SURGERY Left   . POLYPECTOMY  04/17/2017   Procedure: POLYPECTOMY;  Surgeon: Daneil Dolin, MD;  [facility];   Service: Endoscopy;;  . TEE WITHOUT CARDIOVERSION N/A 01/23/2019   Procedure: TRANSESOPHAGEAL ECHOCARDIOGRAM (TEE);  Surgeon: Jerline Pain, MD;  Location: [facility] ENDOSCOPY;  Service: Cardiovascular;  Laterality: N/A;  . [place]  . UPPER EXTREMITY ANGIOGRAPHY N/A 01/19/2018   Procedure: UPPER EXTREMITY ANGIOGRAPHY;  Surgeon: Elam Dutch, MD;  Location: [facility];  Service: Cardiovascular;  Laterality: N/A;    There were no vitals filed for this visit.   Subjective Assessment - 02/13/19 1616    Subjective  Patient reports she was admitted first to [facility] in January and was transferred to [facility] as she needed increased care for the stroke. She reports since returning home from [facility] she continues to feel weak and fatigued easily. She states she is no where close to her normal level of function and mobility. She did not receive any home health and was referred for OP Therapy. She feels in addition to her endurance, strength, and balance being decreased her arms feel weak and she is having difficulty with her vision. She reports the stroke caused her to lose her peripheral vision. She would like to improve her strength and endurance to get back [place].     Pertinent History  Chart Review Reveals: Rita [name] was admitted from ED at [facility] on 01/20/19 after 2 weeks illness including multiple episodes of emesis and diarrhea, abdominal pain, and back pain. She also experienced loss of peripheral vision in the 2 weeks prior to admission. In [facility] ED she was found to have concerning right middle lobe mass - unclear if malignant or pneumonia. She was transferred for this reason to [facility] for further evaluation of that with a biopsy or another modality other than the CTA, and a TEE to identify cause of stroke. Imaging revealed multiple left brain infarcts (occipital and ACA/MCA watershed) embolic secondary to  unknown source.  She was discharged from [facility] on 01/24/19 with antibiotics to address mass in lung and plans to follow up with OPPT.     Limitations  House hold activities;Walking;Standing    Currently in Pain?  Yes    Pain Score  6     Pain Location  Back    Pain Orientation  Lower    Pain Descriptors / Indicators  Aching;Throbbing;Burning    Pain Type  Chronic pain    Pain Onset  More than a month ago    Pain Frequency  Constant    Aggravating Factors   standing    Pain Relieving Factors  rest    Effect of Pain on Daily Activities  limits activity          PT Assessment [facility]) - 02/13/19 0001  Medical Diagnosis Lt CVA  Referring Provider (PT) Danford, Suann Larry, MD  Onset Date/Surgical Date 01/20/19 (s/s approx 2 weeks prior to date)  Hand Dominance Right  Prior Therapy [facility] at [facility]  Precautions  Precautions None  Restrictions  Weight Bearing Restrictions No  Balance Screen  Has the patient fallen in the past 6 months No  Has the patient had a decrease in activity level because of a fear of falling?  No  Is the patient reluctant to leave their home because of a fear of falling?  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)  Home Access Level entry  [place] seat  Additional Comments Pt gets too fatigued to stand in shower and need to sit now  Prior Function  Level of Independence Independent  Cognition  Overall Cognitive Status Within Functional Limits for tasks assessed  Observation/Other Assessments  Focus on Therapeutic Outcomes (FOTO)  52% limited  ROM / Strength  AROM / PROM / Strength Strength  Strength  Strength Assessment Site Hip;Knee;Ankle  Right/Left Knee Right;Left  Right Knee Flexion 4/5  Right Knee Extension 4/5  Left Knee Flexion 4/5  Left Knee Extension 4+/5  Right Hip Flexion 4+/5  Right Hip  Extension 4/5  Right Hip ABduction 4+/5  Left Hip Flexion 4+/5  Left Hip Extension 4/5  Left Hip ABduction 4/5  Right Ankle Dorsiflexion 4+/5  Right Ankle Plantar Flexion 4/5  Left Ankle Dorsiflexion 4+/5  Left Ankle Plantar Flexion 4+/5  Transfers  Five time sit to stand comments  13.7 with hands on thighs  Ambulation/Gait  Ambulation/Gait Yes  Ambulation/Gait Assistance 7: Independent  Ambulation Distance (Feet) 478 Feet  Assistive device None  Gait Pattern WFL  Ambulation Surface Level;Indoor  Gait velocity 1.18 m/s      Objective measurements completed on examination: See above findings.       PT Treatment/Exercise [facility]) - 02/13/19 0001  Transfers  Five time sit to stand comments  13.7 with hands on thighs  Ambulation/Gait  Ambulation/Gait Yes  Ambulation/Gait Assistance 7: Independent  Ambulation Distance (Feet) 478 Feet  Assistive device None  Gait Pattern WFL  Ambulation Surface Level;Indoor  Gait velocity 1.18 m/s  Exercises  Exercises Knee/Hip  Knee/Hip Exercises: Standing  Hip Abduction Both;1 set;10 reps;Knee straight  Abduction Limitations Red TB  Hip Extension Both;1 set;10 reps;Knee straight;Limitations  Extension Limitations Red TB  Knee/Hip Exercises: Seated  Sit to Sand  (educated on for HEP)     PT Education - 02/13/19 1759    Education Details  Educated on exam findings and appropriate POC. Educated on initial HEP.    Person(s) Educated  Patient    Methods  Explanation    Comprehension  Verbalized understanding       PT Short Term Goals - 02/13/19 1759      PT SHORT TERM GOAL #1   Title  Patient will be independent with HEP, updated PRN, to improve funcitonal LE strength and endrance to return to PLOF.    Time  2    Period  Weeks    Status  New    Target Date  02/27/19      PT SHORT TERM GOAL #2   Title  Patient will score a 24/24 on DGI to indicate she is not at risk for falling and has improved balance with dynamic activities  and stair negotiation.    Time  3    Period  Weeks    Status  New    Target Date  03/06/19  PT Long Term Goals - 02/13/19 1759      PT LONG TERM GOAL #1   Title  Patient will perform the 6 MWT at 1.2 m/s to demonstrate improved activity tolerance and endurance while ambulating at typical/safe functional community walking speed.     Time  6    Period  Weeks    Status  New    Target Date  03/27/19      PT LONG TERM GOAL #2   Title  Patient will make 1 grade improvement in bil LE strength with MMT up to 5/5 to demonstrate increased LE strength for improved funcitonal mobility.     Time  6    Period  Weeks    Status  New      PT LONG TERM GOAL #3   Title  Patient will improve FOTO score to 39% or less to improve significantly in self reported limitation related to mobilty and ADL's following her stroke.    Time  6    Period  Weeks    Status  New        Plan - 02/13/19 1759    Clinical Impression Statement  Ms. Faulks presents to physical therapy for evaluation s/p Lt CVA. Her greatest complaints since returning home are weakness in bil UE's/LE's, fatigue, and peripheral vision loss. She presents with weakness in bil LE's Rt>Lt and mild balance deficits with gait. She performed the 2MWT and ambulated at 1.18 m/s placing her in a normal community ambulator category and at reduced risk for falling. She was educated on importance of beginning  a regular exercise routine to improve health to reduce risk of second/future strokes and on [place] program as she has expressed interest in quitting. She will benefit from skilled PT interventions to address impairments and progress towards PLOF. She will also benefit from skilled OT evaluation for vision impairments and UE weakness.    Clinical Presentation  Stable    Clinical Presentation due to:  weakness, reduced activity tolerance, decreased balance, decreased endurance, clinical judgement    Clinical Decision Making  Low    Rehab  Potential  Good    PT Frequency  2x / week    PT Duration  6 weeks    PT Treatment/Interventions  ADLs/Self Care Home Management;Aquatic Therapy;Electrical Stimulation;Cryotherapy;Moist Heat;Stair training;Functional mobility training;Therapeutic activities;Therapeutic exercise;Balance training;Neuromuscular re-education;Patient/family education;Manual techniques;Passive range of motion    PT Next Visit Plan  Review eval and goals. Follow up on initial HEP and advance functional LE strengthening. Provide quitsmart handout.     PT Home Exercise Plan  Eval: hip ext and abd with red TB, sit to stand;     Consulted and Agree with Plan of Care  Patient       Patient will benefit from skilled therapeutic intervention in order to improve the following deficits and impairments:  Decreased activity tolerance, Decreased balance, Decreased endurance, Decreased strength, Difficulty walking, Decreased mobility  Visit Diagnosis: Left sided cerebral hemisphere cerebrovascular accident (CVA) ([place])  Muscle weakness (generalized)     Problem List Patient Active Problem List   Diagnosis Date Noted  . Cavitating mass in right lower lung lobe (PNA Vs Cancer) 01/21/2019  . Gastroenteritis 01/20/2019  . Left sided cerebral hemisphere cerebrovascular accident (CVA) ([place]) 01/20/2019  . Stenosis of left subclavian artery ([place]) 03/19/2018  . Chest pain 02/21/2018  . Left subclavian artery occlusion 02/21/2018  . Rectal bleeding 03/21/2017  . Hiatal hernia with gastroesophageal reflux disease and esophagitis   .  Biliary pain   . Pain of upper abdomen   . Elevated liver enzymes   . Chronic abdominal pain   . Pancreatitis 02/06/2015  . Acute pancreatitis   . Elevated LFTs   . Pancreatitis, acute 05/09/2013  . Anemia 03/27/2013  . Esophageal dysphagia 03/26/2013  . Chronic back pain 03/26/2013  . GERD (gastroesophageal reflux disease) 03/26/2013  . Tobacco use 03/26/2013  . Barrett's esophagus with  esophagitis 03/26/2013  . Elevated lipase 08/20/2011    Kipp Brood, PT, DPT, [facility] Physical Therapist with [facility]  02/14/2019 2:34 PM    [place] [address] [place], [place], [address] Phone: [phone]   Fax:  [phone]  Name: SHELBYLYNN WALCZYK MRN: [id number] Date of Birth: 1966-08-31

## 2019-02-13 NOTE — Telephone Encounter (Signed)
Pt called back to confrim today's apptment. for today and she will let the PT tell her if she needs OT. NF

## 2019-02-14 ENCOUNTER — Encounter (HOSPITAL_COMMUNITY): Payer: Self-pay

## 2019-02-18 ENCOUNTER — Other Ambulatory Visit (HOSPITAL_COMMUNITY): Payer: Self-pay | Admitting: Pulmonary Disease

## 2019-02-18 ENCOUNTER — Other Ambulatory Visit: Payer: Self-pay | Admitting: Pulmonary Disease

## 2019-02-18 ENCOUNTER — Other Ambulatory Visit: Payer: Self-pay | Admitting: Nurse Practitioner

## 2019-02-18 DIAGNOSIS — K2270 Barrett's esophagus without dysplasia: Secondary | ICD-10-CM

## 2019-02-18 DIAGNOSIS — K209 Esophagitis, unspecified without bleeding: Secondary | ICD-10-CM

## 2019-02-18 DIAGNOSIS — R1319 Other dysphagia: Secondary | ICD-10-CM

## 2019-02-18 DIAGNOSIS — K2100 Gastro-esophageal reflux disease with esophagitis, without bleeding: Secondary | ICD-10-CM

## 2019-02-18 DIAGNOSIS — R1310 Dysphagia, unspecified: Secondary | ICD-10-CM

## 2019-02-18 DIAGNOSIS — J189 Pneumonia, unspecified organism: Secondary | ICD-10-CM

## 2019-02-19 ENCOUNTER — Ambulatory Visit (HOSPITAL_COMMUNITY): Payer: Medicare Other | Admitting: Physical Therapy

## 2019-02-19 DIAGNOSIS — M6281 Muscle weakness (generalized): Secondary | ICD-10-CM

## 2019-02-19 DIAGNOSIS — I639 Cerebral infarction, unspecified: Secondary | ICD-10-CM

## 2019-02-19 NOTE — Therapy (Signed)
[place], [place], [address] Phone: [phone]   Fax:  [phone]  Physical Therapy Treatment  Patient Details  Name: Rita [name] MRN: [id number] Date of Birth: August 17, 1966 Referring Provider (PT): Loleta Books Suann Larry, MD   Encounter Date: 02/19/2019  PT End of Session - 02/19/19 1505    Visit Number  2    Number of Visits  12    Date for PT Re-Evaluation  03/27/19    Authorization Type  Medicare Part A and B (no auth requried, no visit limit)    Authorization Time Period  02/13/2019 - 03/29/2019    Authorization - Visit Number  2    Authorization - Number of Visits  10    PT Start Time  1435    PT Stop Time  1515    PT Time Calculation (min)  40 min    Activity Tolerance  Patient tolerated treatment well    Behavior During Therapy  [facility] for tasks assessed/performed       Past Medical History:  Diagnosis Date  . Arthritis    "qwhere" (02/22/2018)  . Barrett's esophagus with esophagitis 03/26/2013  . Chronic abdominal pain   . Chronic lower back pain   . Chronic pancreatitis ([place])   . COPD (chronic obstructive pulmonary disease) ([place])   . Depression   . Dyspnea   . GERD (gastroesophageal reflux disease)   . Heart murmur   . Hiatal hernia   . High cholesterol   . Hypertension   . Peptic ulcer   . Pneumonia 2000s X 1  . Tobacco use 03/26/2013    Past Surgical History:  Procedure Laterality Date  . AORTIC ARCH ANGIOGRAPHY N/A 01/19/2018   Procedure: AORTIC ARCH ANGIOGRAPHY;  Surgeon: Elam Dutch, MD;  Location: [facility];  Service: Cardiovascular;  Laterality: N/A;  . BIOPSY  04/17/2017   Procedure: BIOPSY;  Surgeon: Daneil Dolin, MD;  [facility];  Service: Endoscopy;;  esophageal  . CARDIAC CATHETERIZATION    . CAROTID-SUBCLAVIAN BYPASS GRAFT Left 03/19/2018   Procedure: BYPASS GRAFT CAROTID-SUBCLAVIAN;  Surgeon: Elam Dutch, MD;  Location: [facility] OR;  Service:  Vascular;  Laterality: Left;  . [place]; 1988  . COLONOSCOPY WITH PROPOFOL N/A 04/17/2017   Procedure: COLONOSCOPY WITH PROPOFOL;  Surgeon: Daneil Dolin, MD;  [facility];  Service: Endoscopy;  Laterality: N/A;  100  . ESOPHAGOGASTRODUODENOSCOPY  Oct 2011   Dr. Laural Golden: ulcer in distal esophagus, soft stricture at GE junction s/p balloon dilation PATH: BARRETT'S  . ESOPHAGOGASTRODUODENOSCOPY (EGD) WITH ESOPHAGEAL DILATION N/A 03/27/2013   Procedure: ESOPHAGOGASTRODUODENOSCOPY (EGD) WITH ESOPHAGEAL DILATION;  Surgeon: Daneil Dolin, MD;  [facility];  Service: Endoscopy;  Laterality: N/A;  possible dilation  . ESOPHAGOGASTRODUODENOSCOPY (EGD) WITH PROPOFOL N/A 01/02/2017   Procedure: ESOPHAGOGASTRODUODENOSCOPY (EGD) WITH PROPOFOL;  Surgeon: Daneil Dolin, MD;  [facility];  Service: Endoscopy;  Laterality: N/A;  with possible esophageal dilation  . ESOPHAGOGASTRODUODENOSCOPY (EGD) WITH PROPOFOL N/A 04/17/2017   Procedure: ESOPHAGOGASTRODUODENOSCOPY (EGD) WITH PROPOFOL;  Surgeon: Daneil Dolin, MD;  [facility];  Service: Endoscopy;  Laterality: N/A;  . EUS N/A 05/09/2013   Procedure: UPPER ENDOSCOPIC ULTRASOUND (EUS) LINEAR;  Surgeon: Milus Banister, MD;  [facility];  Service: Endoscopy;  Laterality: N/A;  . FRACTURE SURGERY     pt. denies  . KNEE ARTHROSCOPY Right   . LAPAROSCOPIC  CHOLECYSTECTOMY    . LEFT HEART CATH AND CORONARY ANGIOGRAPHY N/A 02/26/2018   Procedure: LEFT HEART CATH AND CORONARY ANGIOGRAPHY;  Surgeon: Burnell Blanks, MD;  Location: [place];  Service: Cardiovascular;  Laterality: N/A;  . Venia Minks DILATION N/A 04/17/2017   Procedure: Venia Minks DILATION;  Surgeon: Daneil Dolin, MD;  [facility];  Service: Endoscopy;  Laterality: N/A;  . PATELLA FRACTURE SURGERY Left   . POLYPECTOMY  04/17/2017   Procedure: POLYPECTOMY;  Surgeon: Daneil Dolin, MD;  [facility];   Service: Endoscopy;;  . TEE WITHOUT CARDIOVERSION N/A 01/23/2019   Procedure: TRANSESOPHAGEAL ECHOCARDIOGRAM (TEE);  Surgeon: Jerline Pain, MD;  Location: [facility] ENDOSCOPY;  Service: Cardiovascular;  Laterality: N/A;  . [place]  . UPPER EXTREMITY ANGIOGRAPHY N/A 01/19/2018   Procedure: UPPER EXTREMITY ANGIOGRAPHY;  Surgeon: Elam Dutch, MD;  Location: [place] [facility];  Service: Cardiovascular;  Laterality: N/A;    There were no vitals filed for this visit.  Subjective Assessment - 02/19/19 1505    Subjective  pt states she was out walking with her grandkids in the park before thereapy.  States she has been compliant with her HEP and wants to get stronger.  states her Rt anterior shoulder is sore from "hitting the walls" due to loss of her Rt peripheral vision and begins seeing OT soon.  Reports she has an eye exam tomorrow to get a full work-up.  States her lower back hurts today in the center.    Currently in Pain?  Yes    Pain Score  7     Pain Location  Back    Pain Orientation  Mid;Lower    Pain Descriptors / Indicators  Aching    Pain Type  Chronic pain                       [facility] Adult PT Treatment/Exercise - 02/19/19 0001      Knee/Hip Exercises: Stretches   Active Hamstring Stretch  Both;3 reps;20 seconds    Active Hamstring Stretch Limitations  longsitting      Knee/Hip Exercises: Standing   Hip Abduction  Both;1 set;10 reps;Knee straight    Abduction Limitations  reviewed HEP; Red TB    Hip Extension  Both;1 set;10 reps;Knee straight;Limitations    Extension Limitations  reviewed HEP; Red TB      Knee/Hip Exercises: Seated   Sit to Sand  10 reps;without UE support      Knee/Hip Exercises: Supine   Bridges  10 reps    Straight Leg Raises  10 reps;Both      Knee/Hip Exercises: Sidelying   Hip ABduction  Both;10 reps      Knee/Hip Exercises: Prone   Hip Extension  Both;10 reps             PT Education - 02/19/19 1637     Education Details  reveiwed goals, HEP and POC moving forward.  Provided with informaiton on quit smart program    Person(s) Educated  Patient    Methods  Handout;Explanation;Demonstration;Tactile cues;Verbal cues    Comprehension  Returned demonstration;Verbalized understanding;Verbal cues required;Tactile cues required       PT Short Term Goals - 02/19/19 1500      PT SHORT TERM GOAL #1   Title  Patient will be independent with HEP, updated PRN, to improve funcitonal LE strength and endrance to return to PLOF.    Time  2    Period  Weeks    Status  On-going    Target Date  02/27/19      PT SHORT TERM GOAL #2   Title  Patient will score a 24/24 on DGI to indicate she is not at risk for falling and has improved balance with dynamic activities and stair negotiation.    Time  3    Period  Weeks    Status  On-going    Target Date  03/06/19        PT Long Term Goals - 02/19/19 1502      PT LONG TERM GOAL #1   Title  Patient will perform the 6 MWT at 1.2 m/s to demonstrate improved activity tolerance and endurance while ambulating at typical/safe functional community walking speed.     Time  6    Period  Weeks    Status  On-going      PT LONG TERM GOAL #2   Title  Patient will make 1 grade improvement in bil LE strength with MMT up to 5/5 to demonstrate increased LE strength for improved funcitonal mobility.     Time  6    Period  Weeks    Status  On-going      PT LONG TERM GOAL #3   Title  Patient will improve FOTO score to 39% or less to improve significantly in self reported limitation related to mobilty and ADL's following her stroke.    Time  6    Period  Weeks    Status  On-going            Plan - 02/19/19 1638    Clinical Impression Statement  Reviewed goals, HEP and POC moving forward.  Provided with quit smart literature per request for smoking cessation.  Pt rquried verbal and tactile cues with standing hip ex using theraband due to poor mechanics/posturing.   Began additional LE strengthening exercises this session and instructed with hamstirng stretch in long sitting.  Pt with noted tightness bilaterally.  Pt witout complaints during or at end of session     Rehab Potential  Good    PT Frequency  2x / week    PT Duration  6 weeks    PT Treatment/Interventions  ADLs/Self Care Home Management;Aquatic Therapy;Electrical Stimulation;Cryotherapy;Moist Heat;Stair training;Functional mobility training;Therapeutic activities;Therapeutic exercise;Balance training;Neuromuscular re-education;Patient/family education;Manual techniques;Passive range of motion    PT Next Visit Plan  Advance functional LE strengthening as tolerated.   Begin standing ex (i.e lunge, steps, etc)  and dynamic balance activities.      PT Home Exercise Plan  Eval: hip ext and abd with red TB, sit to stand;     Consulted and Agree with Plan of Care  Patient       Patient will benefit from skilled therapeutic intervention in order to improve the following deficits and impairments:  Decreased activity tolerance, Decreased balance, Decreased endurance, Decreased strength, Difficulty walking, Decreased mobility  Visit Diagnosis: Left sided cerebral hemisphere cerebrovascular accident (CVA) ([place])  Muscle weakness (generalized)     Problem List Patient Active Problem List   Diagnosis Date Noted  . Cavitating mass in right lower lung lobe (PNA Vs Cancer) 01/21/2019  . Gastroenteritis 01/20/2019  . Left sided cerebral hemisphere cerebrovascular accident (CVA) ([place]) 01/20/2019  . Stenosis of left subclavian artery ([place]) 03/19/2018  . Chest pain 02/21/2018  . Left subclavian artery occlusion 02/21/2018  . Rectal bleeding 03/21/2017  . Hiatal hernia with gastroesophageal reflux disease and  esophagitis   . Biliary pain   . Pain of upper abdomen   . Elevated liver enzymes   . Chronic abdominal pain   . Pancreatitis 02/06/2015  . Acute pancreatitis   . Elevated LFTs   .  Pancreatitis, acute 05/09/2013  . Anemia 03/27/2013  . Esophageal dysphagia 03/26/2013  . Chronic back pain 03/26/2013  . GERD (gastroesophageal reflux disease) 03/26/2013  . Tobacco use 03/26/2013  . Barrett's esophagus with esophagitis 03/26/2013  . Elevated lipase 08/20/2011   Teena Irani, PTA/CLT [phone]  Teena Irani 02/19/2019, 4:42 PM  [facility] [address] [place], [place], [address] Phone: [phone]   Fax:  [phone]  Name: Rita [name] MRN: [id number] Date of Birth: 1966-11-26

## 2019-02-20 ENCOUNTER — Encounter (HOSPITAL_COMMUNITY): Payer: Self-pay

## 2019-02-20 ENCOUNTER — Ambulatory Visit (HOSPITAL_COMMUNITY): Payer: Medicare Other

## 2019-02-20 DIAGNOSIS — M6281 Muscle weakness (generalized): Secondary | ICD-10-CM

## 2019-02-20 DIAGNOSIS — I639 Cerebral infarction, unspecified: Secondary | ICD-10-CM | POA: Diagnosis not present

## 2019-02-20 NOTE — Therapy (Signed)
[place], [place], [address] Phone: [phone]   Fax:  [phone]  Physical Therapy Treatment  Patient Details  Name: LAKELA KUBA MRN: [id number] Date of Birth: August 03, 1966 Referring Provider (PT): Loleta Books Suann Larry, MD   Encounter Date: 02/20/2019  PT End of Session - 02/20/19 1340    Visit Number  3    Number of Visits  12    Date for PT Re-Evaluation  03/27/19    Authorization Type  Medicare Part A and B (no auth requried, no visit limit)    Authorization Time Period  02/13/2019 - 03/29/2019    Authorization - Visit Number  3    Authorization - Number of Visits  10    PT Start Time  5329    PT Stop Time  1415    PT Time Calculation (min)  42 min    Equipment Utilized During Treatment  Gait belt    Activity Tolerance  Patient tolerated treatment well    Behavior During Therapy  WFL for tasks assessed/performed       Past Medical History:  Diagnosis Date  . Arthritis    "qwhere" (02/22/2018)  . Barrett's esophagus with esophagitis 03/26/2013  . Chronic abdominal pain   . Chronic lower back pain   . Chronic pancreatitis ([place])   . COPD (chronic obstructive pulmonary disease) ([place])   . Depression   . Dyspnea   . GERD (gastroesophageal reflux disease)   . Heart murmur   . Hiatal hernia   . High cholesterol   . Hypertension   . Peptic ulcer   . Pneumonia 2000s X 1  . Tobacco use 03/26/2013    Past Surgical History:  Procedure Laterality Date  . AORTIC ARCH ANGIOGRAPHY N/A 01/19/2018   Procedure: AORTIC ARCH ANGIOGRAPHY;  Surgeon: Elam Dutch, MD;  Location: [place] [facility];  Service: Cardiovascular;  Laterality: N/A;  . BIOPSY  04/17/2017   Procedure: BIOPSY;  Surgeon: Daneil Dolin, MD;  [facility];  Service: Endoscopy;;  esophageal  . CARDIAC CATHETERIZATION    . CAROTID-SUBCLAVIAN BYPASS GRAFT Left 03/19/2018   Procedure: BYPASS GRAFT CAROTID-SUBCLAVIAN;  Surgeon: Elam Dutch, MD;  Location: [facility] OR;  Service: Vascular;  Laterality: Left;  . [place]; 1988  . COLONOSCOPY WITH PROPOFOL N/A 04/17/2017   Procedure: COLONOSCOPY WITH PROPOFOL;  Surgeon: Daneil Dolin, MD;  [facility];  Service: Endoscopy;  Laterality: N/A;  100  . ESOPHAGOGASTRODUODENOSCOPY  Oct 2011   Dr. Laural Golden: ulcer in distal esophagus, soft stricture at GE junction s/p balloon dilation PATH: BARRETT'S  . ESOPHAGOGASTRODUODENOSCOPY (EGD) WITH ESOPHAGEAL DILATION N/A 03/27/2013   Procedure: ESOPHAGOGASTRODUODENOSCOPY (EGD) WITH ESOPHAGEAL DILATION;  Surgeon: Daneil Dolin, MD;  [facility];  Service: Endoscopy;  Laterality: N/A;  possible dilation  . ESOPHAGOGASTRODUODENOSCOPY (EGD) WITH PROPOFOL N/A 01/02/2017   Procedure: ESOPHAGOGASTRODUODENOSCOPY (EGD) WITH PROPOFOL;  Surgeon: Daneil Dolin, MD;  [facility];  Service: Endoscopy;  Laterality: N/A;  with possible esophageal dilation  . ESOPHAGOGASTRODUODENOSCOPY (EGD) WITH PROPOFOL N/A 04/17/2017   Procedure: ESOPHAGOGASTRODUODENOSCOPY (EGD) WITH PROPOFOL;  Surgeon: Daneil Dolin, MD;  [facility];  Service: Endoscopy;  Laterality: N/A;  . EUS N/A 05/09/2013   Procedure: UPPER ENDOSCOPIC ULTRASOUND (EUS) LINEAR;  Surgeon: Milus Banister, MD;  [facility];  Service: Endoscopy;  Laterality: N/A;  . FRACTURE SURGERY     pt.  denies  . KNEE ARTHROSCOPY Right   . LAPAROSCOPIC CHOLECYSTECTOMY    . LEFT HEART CATH AND CORONARY ANGIOGRAPHY N/A 02/26/2018   Procedure: LEFT HEART CATH AND CORONARY ANGIOGRAPHY;  Surgeon: Burnell Blanks, MD;  Location: [facility] [facility];  Service: Cardiovascular;  Laterality: N/A;  . Venia Minks DILATION N/A 04/17/2017   Procedure: Venia Minks DILATION;  Surgeon: Daneil Dolin, MD;  [facility];  Service: Endoscopy;  Laterality: N/A;  . PATELLA FRACTURE SURGERY Left   . POLYPECTOMY  04/17/2017   Procedure: POLYPECTOMY;  Surgeon: Daneil Dolin, MD;  [facility];  Service: Endoscopy;;  . TEE WITHOUT CARDIOVERSION N/A 01/23/2019   Procedure: TRANSESOPHAGEAL ECHOCARDIOGRAM (TEE);  Surgeon: Jerline Pain, MD;  Location: [facility] ENDOSCOPY;  Service: Cardiovascular;  Laterality: N/A;  . [place]  . UPPER EXTREMITY ANGIOGRAPHY N/A 01/19/2018   Procedure: UPPER EXTREMITY ANGIOGRAPHY;  Surgeon: Elam Dutch, MD;  Location: [place] [facility];  Service: Cardiovascular;  Laterality: N/A;    There were no vitals filed for this visit.  Subjective Assessment - 02/20/19 1328    Subjective  Pt stated she has been complaint with HEP.  Reports some increased LBP, pain scale 7/10 and soreness on Rt shoulder feels from "hitting the walls" from loss of peripheral vision, had eye apt earlier today.  Reports improvements with hand writing though does have some fine motor problems with hand use.      Pertinent History  Chart Review Reveals: Ms. Marcos was admitted from ED at [facility] on 01/20/19 after 2 weeks illness including multiple episodes of emesis and diarrhea, abdominal pain, and back pain. She also experienced loss of peripheral vision in the 2 weeks prior to admission. In [facility] ED she was found to have concerning right middle lobe mass - unclear if malignant or pneumonia. She was transferred for this reason to [facility] for further evaluation of that with a biopsy or another modality other than the CTA, and a TEE to identify cause of stroke. Imaging revealed multiple left brain infarcts (occipital and ACA/MCA watershed) embolic secondary to unknown source.  She was discharged from [facility] on 01/24/19 with antibiotics to address mass in lung and plans to follow up with OPPT.     Currently in Pain?  Yes    Pain Score  7     Pain Location  Back    Pain Orientation  Mid;Lower    Pain Descriptors / Indicators  Aching    Pain Type  Chronic pain    Pain Onset  More than a month ago    Pain Frequency  Constant    Aggravating  Factors   standing    Pain Relieving Factors  rest    Effect of Pain on Daily Activities  limits activity                       [facility] Adult PT Treatment/Exercise - 02/20/19 0001      Knee/Hip Exercises: Stretches   Active Hamstring Stretch  Both;3 reps;20 seconds    Active Hamstring Stretch Limitations  longsitting      Knee/Hip Exercises: Standing   Heel Raises  15 reps    Heel Raises Limitations  heel and toe raises on incline slope    Forward Lunges  Both;15 reps    Forward Lunges Limitations  on 6 in step    Forward Step Up  15 reps;Hand Hold: 0;Step Height: 6"  Step Down  10 reps;Step Height: 4"      Knee/Hip Exercises: Prone   Other Prone Exercises  Quadruped: opposite arm/leg 10x 5"          Balance Exercises - 02/20/19 1352      Balance Exercises: Standing   SLS  Eyes open;Solid surface;3 reps   Rt 31", Lt 16" max    SLS with Vectors  Solid surface;3 reps   3x 5" BLE wiht 1 HHA   Balance Beam  begin next session    Tandem Gait  1 rep    Retro Gait  1 rep    Sidestepping  2 reps;Theraband        PT Education - 02/20/19 1414    Education Details  Discussed and flyer given for brain injury support group    Person(s) Educated  Patient    Methods  Explanation;Handout    Comprehension  Verbalized understanding       PT Short Term Goals - 02/19/19 1500      PT SHORT TERM GOAL #1   Title  Patient will be independent with HEP, updated PRN, to improve funcitonal LE strength and endrance to return to PLOF.    Time  2    Period  Weeks    Status  On-going    Target Date  02/27/19      PT SHORT TERM GOAL #2   Title  Patient will score a 24/24 on DGI to indicate she is not at risk for falling and has improved balance with dynamic activities and stair negotiation.    Time  3    Period  Weeks    Status  On-going    Target Date  03/06/19        PT Long Term Goals - 02/19/19 1502      PT LONG TERM GOAL #1   Title  Patient will perform the  6 MWT at 1.2 m/s to demonstrate improved activity tolerance and endurance while ambulating at typical/safe functional community walking speed.     Time  6    Period  Weeks    Status  On-going      PT LONG TERM GOAL #2   Title  Patient will make 1 grade improvement in bil LE strength with MMT up to 5/5 to demonstrate increased LE strength for improved funcitonal mobility.     Time  6    Period  Weeks    Status  On-going      PT LONG TERM GOAL #3   Title  Patient will improve FOTO score to 39% or less to improve significantly in self reported limitation related to mobilty and ADL's following her stroke.    Time  6    Period  Weeks    Status  On-going            Plan - 02/20/19 1415    Clinical Impression Statement  Progressed functional strengthening and balance training with additional CKC exercises.  Pt able to complete all exericses with good form following initial instruction and min cueing to look up and improve form to assist with SLS activities.  Pt presents with good stability with static balance activities, progressed to tandem and retro tandem gait with min guard, ready for balance beam next session.  Pt educated on brain injury support group for community outreach, given paperwork for next meeting.  No reports of increasead pain through session, was limited by fatigue wiht activities.  Rehab Potential  Good    PT Frequency  2x / week    PT Duration  6 weeks    PT Treatment/Interventions  ADLs/Self Care Home Management;Aquatic Therapy;Electrical Stimulation;Cryotherapy;Moist Heat;Stair training;Functional mobility training;Therapeutic activities;Therapeutic exercise;Balance training;Neuromuscular re-education;Patient/family education;Manual techniques;Passive range of motion    PT Next Visit Plan  Balance beam, hurdles, balance master (may be difficult with vision) and advance functional LE strengthening.  Add foam wiht SLS/vector stance.  Lower to 4in step with forward  lunges.      PT Home Exercise Plan  Eval: hip ext and abd with red TB, sit to stand;        Patient will benefit from skilled therapeutic intervention in order to improve the following deficits and impairments:  Decreased activity tolerance, Decreased balance, Decreased endurance, Decreased strength, Difficulty walking, Decreased mobility  Visit Diagnosis: Left sided cerebral hemisphere cerebrovascular accident (CVA) ([facility])  Muscle weakness (generalized)     Problem List Patient Active Problem List   Diagnosis Date Noted  . Cavitating mass in right lower lung lobe (PNA Vs Cancer) 01/21/2019  . Gastroenteritis 01/20/2019  . Left sided cerebral hemisphere cerebrovascular accident (CVA) ([place]) 01/20/2019  . Stenosis of left subclavian artery ([place]) 03/19/2018  . Chest pain 02/21/2018  . Left subclavian artery occlusion 02/21/2018  . Rectal bleeding 03/21/2017  . Hiatal hernia with gastroesophageal reflux disease and esophagitis   . Biliary pain   . Pain of upper abdomen   . Elevated liver enzymes   . Chronic abdominal pain   . Pancreatitis 02/06/2015  . Acute pancreatitis   . Elevated LFTs   . Pancreatitis, acute 05/09/2013  . Anemia 03/27/2013  . Esophageal dysphagia 03/26/2013  . Chronic back pain 03/26/2013  . GERD (gastroesophageal reflux disease) 03/26/2013  . Tobacco use 03/26/2013  . Barrett's esophagus with esophagitis 03/26/2013  . Elevated lipase 08/20/2011   Ihor Austin, LPTA; [place]  Aldona Lento 02/20/2019, 2:27 PM  [facility] [address] [place], [place], [address] Phone: [phone]   Fax:  [phone]  Name: SALISHA BARDSLEY MRN: [id number] Date of Birth: [birth year]

## 2019-02-25 ENCOUNTER — Ambulatory Visit (HOSPITAL_COMMUNITY): Payer: Medicare Other | Admitting: Physical Therapy

## 2019-02-25 ENCOUNTER — Telehealth (HOSPITAL_COMMUNITY): Payer: Self-pay

## 2019-02-25 NOTE — Telephone Encounter (Signed)
02/25/19  pt called to cx said that she couldn't make it

## 2019-02-27 ENCOUNTER — Ambulatory Visit (HOSPITAL_COMMUNITY): Payer: Medicare Other | Attending: Family Medicine

## 2019-02-27 ENCOUNTER — Telehealth (HOSPITAL_COMMUNITY): Payer: Self-pay

## 2019-02-27 NOTE — Telephone Encounter (Signed)
No Show # 1: I called [name] at her home number on file to check in as she did not arrive for her 3:15 PM appointment. She did not answer and I left her a voice message informing her that this does count as a no show visit. I also reminded her of her next scheduled appointment for Monday 02/27/19 at 1:45 PM and asked that she call our front office if she cannot make it. I provided our front office number, [phone].  Kipp Brood, PT, DPT, [facility] Physical Therapist with [facility]  02/27/2019 3:45 PM

## 2019-03-04 ENCOUNTER — Telehealth (HOSPITAL_COMMUNITY): Payer: Self-pay

## 2019-03-04 ENCOUNTER — Ambulatory Visit (HOSPITAL_COMMUNITY): Payer: Medicare Other

## 2019-03-04 NOTE — Telephone Encounter (Signed)
No show #2; PT called and LVM regarding missed appointment. Reminded her of her next scheduled appointment and encouraged her to call if she could not make it. Also reminded her of [facility] attendance policy. Geraldine Solar PT, DPT

## 2019-03-06 ENCOUNTER — Ambulatory Visit (HOSPITAL_COMMUNITY): Payer: Medicare Other | Admitting: Physical Therapy

## 2019-03-06 ENCOUNTER — Telehealth (HOSPITAL_COMMUNITY): Payer: Self-pay | Admitting: Physical Therapy

## 2019-03-06 ENCOUNTER — Ambulatory Visit (HOSPITAL_COMMUNITY): Payer: Medicare Other

## 2019-03-06 NOTE — Telephone Encounter (Signed)
Pt did not show for PT appointment (3rd NS) or OT evaluation.  Called number given and spoke to [place], significant other who reports patient states she is done with therapy, however he would like for her to continue coming.  Per rehab policy, explained to CG that she will need a new referral to continue therapy at this point and we were to discharge her due non-compliance.  CG reports understanding.  Teena Irani, PTA/CLT [phone]

## 2019-03-07 ENCOUNTER — Ambulatory Visit (HOSPITAL_COMMUNITY): Admission: RE | Admit: 2019-03-07 | Payer: Medicare Other | Source: Ambulatory Visit

## 2019-03-07 ENCOUNTER — Encounter (HOSPITAL_COMMUNITY): Payer: Self-pay

## 2019-03-11 ENCOUNTER — Ambulatory Visit (HOSPITAL_COMMUNITY): Payer: Medicare Other

## 2019-03-13 ENCOUNTER — Ambulatory Visit (HOSPITAL_COMMUNITY): Payer: Medicare Other

## 2019-03-13 ENCOUNTER — Encounter (HOSPITAL_COMMUNITY): Payer: Self-pay

## 2019-03-13 NOTE — Therapy (Signed)
[facility] [place], [place], [address] Phone: [phone]   Fax:  [phone]  Patient Details  Name: Rita [name] MRN: [id number] Date of Birth: [birth year] Referring Provider:  No ref. provider found  Encounter Date: 03/13/2019   PHYSICAL THERAPY DISCHARGE SUMMARY  Visits from Start of Care: 3  Current functional level related to goals / functional outcomes: Patient had 3 consecutive no-shows and received phone calls for all missed visits to attempt to contact patient and remind her of the next upcoming appointment. On third no show therapist spoke with patient's significant other who stated the patient would like to be done with therapy. The calling therapist informed the patient's significant other that they would need a new referral to begin therapy again and she will be discharged from this episode.    Remaining deficits: See last treatment notes and below goals not formally re-assessed due to short duration of therapy.    PT Short Term Goals - 02/19/19 1500            PT SHORT TERM GOAL #1   Title  Patient will be independent with HEP, updated PRN, to improve funcitonal LE strength and endrance to return to PLOF.    Time  2    Period  Weeks    Status  On-going    Target Date  02/27/19        PT SHORT TERM GOAL #2   Title  Patient will score a 24/24 on DGI to indicate she is not at risk for falling and has improved balance with dynamic activities and stair negotiation.    Time  3    Period  Weeks    Status  On-going    Target Date  03/06/19           PT Long Term Goals - 02/19/19 1502            PT LONG TERM GOAL #1   Title  Patient will perform the 6 MWT at 1.2 m/s to demonstrate improved activity tolerance and endurance while ambulating at typical/safe functional community walking speed.     Time  6    Period  Weeks    Status  On-going        PT LONG TERM GOAL #2   Title   Patient will make 1 grade improvement in bil LE strength with MMT up to 5/5 to demonstrate increased LE strength for improved funcitonal mobility.     Time  6    Period  Weeks    Status  On-going        PT LONG TERM GOAL #3   Title  Patient will improve FOTO score to 39% or less to improve significantly in self reported limitation related to mobilty and ADL's following her stroke.    Time  6    Period  Weeks    Status  TEFL teacher / Equipment: Patient educated on HEP throughout therapy duration. Educated on attendance and no-show policy for cancellations and discharge.   Plan: Patient agrees to discharge.  Patient goals were not met. Patient is being discharged due to not returning since the last visit.  ?????     Kipp Brood, PT, DPT, [facility] Physical Therapist with [facility]  03/13/2019 8:38 AM    [facility] [address] [place], [place], [address] Phone: [phone]   Fax:  [phone]

## 2019-03-18 ENCOUNTER — Ambulatory Visit (HOSPITAL_COMMUNITY): Payer: Medicare Other

## 2019-03-20 ENCOUNTER — Encounter (HOSPITAL_COMMUNITY): Payer: Medicare Other

## 2019-03-25 ENCOUNTER — Encounter (HOSPITAL_COMMUNITY): Payer: Medicare Other

## 2019-03-28 ENCOUNTER — Encounter (HOSPITAL_COMMUNITY): Payer: Medicare Other

## 2019-07-22 DIAGNOSIS — I639 Cerebral infarction, unspecified: Secondary | ICD-10-CM | POA: Diagnosis not present

## 2019-07-22 DIAGNOSIS — R918 Other nonspecific abnormal finding of lung field: Secondary | ICD-10-CM | POA: Diagnosis not present

## 2019-07-22 DIAGNOSIS — J449 Chronic obstructive pulmonary disease, unspecified: Secondary | ICD-10-CM | POA: Diagnosis not present

## 2019-07-23 ENCOUNTER — Other Ambulatory Visit: Payer: Self-pay | Admitting: Pulmonary Disease

## 2019-07-23 ENCOUNTER — Other Ambulatory Visit (HOSPITAL_COMMUNITY): Payer: Self-pay | Admitting: Pulmonary Disease

## 2019-07-23 DIAGNOSIS — R918 Other nonspecific abnormal finding of lung field: Secondary | ICD-10-CM

## 2019-08-14 ENCOUNTER — Ambulatory Visit (HOSPITAL_COMMUNITY): Admission: RE | Admit: 2019-08-14 | Payer: Medicare Other | Source: Ambulatory Visit

## 2019-08-14 ENCOUNTER — Encounter (HOSPITAL_COMMUNITY): Payer: Self-pay

## 2019-09-20 ENCOUNTER — Other Ambulatory Visit: Payer: Self-pay

## 2019-09-20 ENCOUNTER — Encounter (HOSPITAL_COMMUNITY): Payer: Self-pay | Admitting: Emergency Medicine

## 2019-09-20 ENCOUNTER — Emergency Department (HOSPITAL_COMMUNITY): Payer: Medicare Other

## 2019-09-20 ENCOUNTER — Emergency Department (HOSPITAL_COMMUNITY)
Admission: EM | Admit: 2019-09-20 | Discharge: 2019-09-20 | Disposition: A | Discharge status: Home / Self Care | Payer: Medicare Other | Attending: Emergency Medicine | Admitting: Emergency Medicine

## 2019-09-20 DIAGNOSIS — Z7901 Long term (current) use of anticoagulants: Secondary | ICD-10-CM | POA: Insufficient documentation

## 2019-09-20 DIAGNOSIS — Z79899 Other long term (current) drug therapy: Secondary | ICD-10-CM | POA: Diagnosis not present

## 2019-09-20 DIAGNOSIS — F17210 Nicotine dependence, cigarettes, uncomplicated: Secondary | ICD-10-CM | POA: Insufficient documentation

## 2019-09-20 DIAGNOSIS — Z7982 Long term (current) use of aspirin: Secondary | ICD-10-CM | POA: Diagnosis not present

## 2019-09-20 DIAGNOSIS — I69354 Hemiplegia and hemiparesis following cerebral infarction affecting left non-dominant side: Secondary | ICD-10-CM | POA: Insufficient documentation

## 2019-09-20 DIAGNOSIS — Z955 Presence of coronary angioplasty implant and graft: Secondary | ICD-10-CM | POA: Diagnosis not present

## 2019-09-20 DIAGNOSIS — M79671 Pain in right foot: Secondary | ICD-10-CM | POA: Diagnosis not present

## 2019-09-20 DIAGNOSIS — I10 Essential (primary) hypertension: Secondary | ICD-10-CM | POA: Insufficient documentation

## 2019-09-20 MED ORDER — PREDNISONE 20 MG PO TABS: 40.0000 mg | ORAL_TABLET | Freq: Every day | ORAL | 0 refills | Status: AC

## 2019-09-20 MED ORDER — OXYCODONE-ACETAMINOPHEN 5-325 MG PO TABS
1.0000 | ORAL_TABLET | Freq: Once | ORAL | Status: AC
  Administered 2019-09-20: 1 via ORAL
  Filled 2019-09-20: qty 1

## 2019-09-20 MED ORDER — COLCHICINE 0.6 MG PO TABS: 0.6000 mg | ORAL_TABLET | Freq: Two times a day (BID) | ORAL | 0 refills | Status: DC

## 2019-09-20 NOTE — ED Provider Notes (Signed)
[facility] EMERGENCY DEPARTMENT Provider Note   CSN: [id number] Arrival date & time: 09/20/19  1743     History   Chief Complaint Chief Complaint  Patient presents with   Foot Pain    right    HPI Rita [name] is a 53 y.o. female.     53 y.o female with a PMH of COPD, GERD, HTN presents to the ED with a chief complaint of right foot pain x 1 week. Patient reports a sharp pain to the lateral aspect of his right foot, this pain is worse with movement and worse with ambulation. She has been ambulating with a cane as she reports she is unable to put any pressure to her right foot.  She has tried elevation, Tylenol, ibuprofen along with warm soaks without improvement in symptoms.  Patient reports is extremely painful for her to touch the lateral aspect of her foot.  She does not have any prior history of gout.  She denies drinking any alcohol, denies intake of red meats.  She denies any trauma, fevers, IV drug use.  The history is provided by the patient.    Past Medical History:  Diagnosis Date   Arthritis    "qwhere" (02/22/2018)   Barrett's esophagus with esophagitis 03/26/2013   Chronic abdominal pain    Chronic lower back pain    Chronic pancreatitis (HCC)    COPD (chronic obstructive pulmonary disease) (HCC)    Depression    Dyspnea    GERD (gastroesophageal reflux disease)    Heart murmur    Hiatal hernia    High cholesterol    Hypertension    Peptic ulcer    Pneumonia 2000s X 1   Tobacco use 03/26/2013    Patient Active Problem List   Diagnosis Date Noted   Cavitating mass in right lower lung lobe (PNA Vs Cancer) 01/21/2019   Gastroenteritis 01/20/2019   Left sided cerebral hemisphere cerebrovascular accident (CVA) ([place]) 01/20/2019   Stenosis of left subclavian artery ([place]) 03/19/2018   Chest pain 02/21/2018   Left subclavian artery occlusion 02/21/2018   Rectal bleeding 03/21/2017   Hiatal hernia with gastroesophageal reflux disease  and esophagitis    Biliary pain    Pain of upper abdomen    Elevated liver enzymes    Chronic abdominal pain    Pancreatitis 02/06/2015   Acute pancreatitis    Elevated LFTs    Pancreatitis, acute 05/09/2013   Anemia 03/27/2013   Esophageal dysphagia 03/26/2013   Chronic back pain 03/26/2013   GERD (gastroesophageal reflux disease) 03/26/2013   Tobacco use 03/26/2013   Barrett's esophagus with esophagitis 03/26/2013   Elevated lipase 08/20/2011    Past Surgical History:  Procedure Laterality Date   AORTIC ARCH ANGIOGRAPHY N/A 01/19/2018   Procedure: AORTIC ARCH ANGIOGRAPHY;  Surgeon: Elam Dutch, MD;  Location: [facility];  Service: Cardiovascular;  Laterality: N/A;   BIOPSY  04/17/2017   Procedure: BIOPSY;  Surgeon: Daneil Dolin, MD;  [facility];  Service: Endoscopy;;  esophageal   CARDIAC CATHETERIZATION     CAROTID-SUBCLAVIAN BYPASS GRAFT Left 03/19/2018   Procedure: BYPASS GRAFT CAROTID-SUBCLAVIAN;  Surgeon: Elam Dutch, MD;  Location: [facility];  Service: Vascular;  Laterality: Left;   [place]; 1988   COLONOSCOPY WITH PROPOFOL N/A 04/17/2017   Procedure: COLONOSCOPY WITH PROPOFOL;  Surgeon: Daneil Dolin, MD;  [facility]: [facility];  Service: Endoscopy;  Laterality: N/A;  100   ESOPHAGOGASTRODUODENOSCOPY  Oct 2011   Dr. Laural Golden: ulcer in distal esophagus, soft stricture at GE junction s/p balloon dilation PATH: BARRETT'S   ESOPHAGOGASTRODUODENOSCOPY (EGD) WITH ESOPHAGEAL DILATION N/A 03/27/2013   Procedure: ESOPHAGOGASTRODUODENOSCOPY (EGD) WITH ESOPHAGEAL DILATION;  Surgeon: Daneil Dolin, MD;  [facility];  Service: Endoscopy;  Laterality: N/A;  possible dilation   ESOPHAGOGASTRODUODENOSCOPY (EGD) WITH PROPOFOL N/A 01/02/2017   Procedure: ESOPHAGOGASTRODUODENOSCOPY (EGD) WITH PROPOFOL;  Surgeon: Daneil Dolin, MD;  [facility];  Service: Endoscopy;  Laterality: N/A;  with possible  esophageal dilation   ESOPHAGOGASTRODUODENOSCOPY (EGD) WITH PROPOFOL N/A 04/17/2017   Procedure: ESOPHAGOGASTRODUODENOSCOPY (EGD) WITH PROPOFOL;  Surgeon: Daneil Dolin, MD;  [facility];  Service: Endoscopy;  Laterality: N/A;   EUS N/A 05/09/2013   Procedure: UPPER ENDOSCOPIC ULTRASOUND (EUS) LINEAR;  Surgeon: Milus Banister, MD;  Location: [facility];  Service: Endoscopy;  Laterality: N/A;   FRACTURE SURGERY     pt. denies   KNEE ARTHROSCOPY Right    LAPAROSCOPIC CHOLECYSTECTOMY     LEFT HEART CATH AND CORONARY ANGIOGRAPHY N/A 02/26/2018   Procedure: LEFT HEART CATH AND CORONARY ANGIOGRAPHY;  Surgeon: Burnell Blanks, MD;  Location: [facility];  Service: Cardiovascular;  Laterality: N/A;   MALONEY DILATION N/A 04/17/2017   Procedure: Venia Minks DILATION;  Surgeon: Daneil Dolin, MD;  [facility];  Service: Endoscopy;  Laterality: N/A;   PATELLA FRACTURE SURGERY Left    POLYPECTOMY  04/17/2017   Procedure: POLYPECTOMY;  Surgeon: Daneil Dolin, MD;  [facility];  Service: Endoscopy;;   TEE WITHOUT CARDIOVERSION N/A 01/23/2019   Procedure: TRANSESOPHAGEAL ECHOCARDIOGRAM (TEE);  Surgeon: Jerline Pain, MD;  Location: [facility];  Service: Cardiovascular;  Laterality: N/A;   [place] N/A 01/19/2018   Procedure: UPPER EXTREMITY ANGIOGRAPHY;  Surgeon: Elam Dutch, MD;  Location: [facility];  Service: Cardiovascular;  Laterality: N/A;     OB History    Gravida  4   Para  3   Term  3   Preterm      AB  1   Living  3     SAB  1   TAB      Ectopic      Multiple      Live Births               Home Medications    Prior to Admission medications   Medication Sig Start Date End Date Taking? Authorizing Provider  amLODipine (NORVASC) 5 MG tablet Take 1 tablet (5 mg total) by mouth daily. Patient taking differently: Take 5 mg by mouth at bedtime.  03/06/18    Caren Macadam, MD  aspirin EC 81 MG tablet Take 1 tablet (81 mg total) by mouth daily. Patient taking differently: Take 81 mg by mouth at bedtime.  03/06/18   Caren Macadam, MD  atorvastatin (LIPITOR) 80 MG tablet Take 1 tablet (80 mg total) by mouth daily at 6 PM. 01/24/19   Danford, Suann Larry, MD  Buprenorphine HCl-Naloxone HCl 8-2 MG FILM Take 1 Film by mouth daily. 1/2 film under the tongue 12/14/18   [provider]  clopidogrel (PLAVIX) 75 MG tablet Take 1 tablet (75 mg total) by mouth daily. 01/24/19   Danford, Suann Larry, MD  colchicine 0.6 MG tablet Take 1 tablet (0.6 mg total) by mouth 2 (two) times daily for 7 days. 09/20/19 09/27/19  Aleks Nawrot,  Gerica Koble, PA-C  DEXILANT 60 MG capsule TAKE 1 CAPSULE(60 MG) BY MOUTH AT BEDTIME 02/19/19   Annitta Needs, NP  Oxycodone HCl 10 MG TABS Take 1 tablet (10 mg total) by mouth every 6 (six) hours as needed. Patient not taking: Reported on 02/13/2019 03/21/18   Ulyses Amor, PA-C  potassium chloride SA (K-DUR,KLOR-CON) 20 MEQ tablet Take 20 mEq by mouth at bedtime.     [provider]  predniSONE (DELTASONE) 20 MG tablet Take 2 tablets (40 mg total) by mouth daily for 5 days. 09/20/19 09/25/19  Janeece Fitting, PA-C  saccharomyces boulardii (FLORASTOR) 250 MG capsule Take 1 capsule (250 mg total) by mouth 2 (two) times daily. Patient not taking: Reported on 02/13/2019 01/24/19   Danford, Suann Larry, MD    Family History Family History  Problem Relation Age of Onset   Asthma Mother    Heart failure Mother    Cancer Mother        pancreatic   Diabetes Mother    Hypertension Mother    Stroke Mother    Pancreatic cancer Mother        deceased   Heart failure Father    Diabetes Father    Colon cancer Neg Hx     Social History Social History   Tobacco Use   Smoking status: Current Every Day Smoker    Packs/day: 0.50    Years: 41.00    Pack years: 20.50    Types: Cigarettes    Start date: 09/26/1982   Smokeless  tobacco: Never Used  Substance Use Topics   Alcohol use: No   Drug use: No     Allergies   Patient has no known allergies.   Review of Systems Review of Systems  Constitutional: Negative for fever.  Musculoskeletal: Positive for arthralgias.  Skin: Negative for pallor and wound.     Physical Exam Updated Vital Signs BP (!) 169/95 (BP Location: Right Arm)    Pulse 79    Temp (!) 97.1 F (36.2 C) (Oral)    Resp 17    Ht 5\' 7"  (1.702 m)    Wt 90.7 kg    LMP 02/09/2012    SpO2 100%    BMI 31.32 kg/m   Physical Exam Vitals signs and nursing note reviewed.  Constitutional:      General: She is not in acute distress.    Appearance: Normal appearance. She is well-developed.  HENT:     Head: Normocephalic and atraumatic.     Mouth/Throat:     Pharynx: No oropharyngeal exudate.  Eyes:     Pupils: Pupils are equal, round, and reactive to light.  Neck:     Musculoskeletal: Normal range of motion.  Cardiovascular:     Rate and Rhythm: Regular rhythm.     Pulses:          Dorsalis pedis pulses are 2+ on the right side.       Posterior tibial pulses are 2+ on the right side.     Heart sounds: Normal heart sounds.  Pulmonary:     Effort: Pulmonary effort is normal. No respiratory distress.     Breath sounds: Normal breath sounds.  Abdominal:     General: Bowel sounds are normal. There is no distension.     Palpations: Abdomen is soft.     Tenderness: There is no abdominal tenderness.  Musculoskeletal:        General: No tenderness or deformity.     Right  lower leg: No edema.     Left lower leg: No edema.     Right foot: Normal range of motion and normal capillary refill. Swelling present. No tenderness, bony tenderness, crepitus, deformity or laceration.       Feet:  Feet:     Right foot:     Skin integrity: Callus and dry skin present.     Toenail Condition: Right toenails are normal.  Skin:    General: Skin is warm and dry.  Neurological:     Mental Status: She is  alert and oriented to person, place, and time.      ED Treatments / Results  Labs (all labs ordered are listed, but only abnormal results are displayed) Labs Reviewed - No data to display  EKG None  Radiology Dg Foot Complete Right  Result Date: 09/20/2019 CLINICAL DATA:  RIGHT foot pain. EXAM: RIGHT FOOT COMPLETE - 3+ VIEW COMPARISON:  None. FINDINGS: No fracture or dislocation of mid foot or forefoot. The phalanges are normal. The calcaneus is normal. No soft tissue abnormality. IMPRESSION: No fracture or dislocation. Electronically Signed   By: Suzy Bouchard M.D.   On: 09/20/2019 21:20    Procedures Procedures (including critical care time)  Medications Ordered in ED Medications  oxyCODONE-acetaminophen (PERCOCET/ROXICET) 5-325 MG per tablet 1 tablet (1 tablet Oral Given 09/20/19 2102)     Initial Impression / Assessment and Plan / ED Course  I have reviewed the triage vital signs and the nursing notes.  Pertinent labs & imaging results that were available during my care of the patient were reviewed by me and considered in my medical decision making (see chart for details).       Patient with extensive past medical history presents to the ED with complaints of right foot pain x1 week.  Reports her symptoms have worsened in the past 2 days, she is currently ambulating with a cane, reports its extreme is appreciated for her to place any weight on her right foot, she also reports some mild swelling.  She is neurovascularly intact, there are no changes in skin, streaking, low suspicion for any cellulitis.  Has not been running fevers at home, denies any previous history of IV drug use.  No fevers, is able to range her foot with pain.  Low suspicion for any septic joint.  Patient does not have a previous history of gout, reports she currently does not drink any alcohol. She denies having any trauma, prior fracture to the right foot.  Will obtain x-ray of her right foot to  further evaluate patient's condition.  She was provided with Percocet to help with her pain.    Xray of her right foot showed: No fracture or dislocation.  Some suspicion the patient likely has gout to the midfoot.  She reports pain has improved after Percocet therapy.  Patient is not a diabetic, will prescribe her some prednisone to help decrease inflammation and pain.  She will also go home on colchicine, she has been educated about colchicine and her anticoagulation.  She is also going to be provided with a referral to podiatry for further follow-up.  Patient understands and agrees with management, stable vital signs.  Patient stable for discharge.   Portions of this note were generated with [employer]obby[employer]. Dictation errors may occur despite best attempts at proofreading.  Final Clinical Impressions(s) / ED Diagnoses   Final diagnoses:  Foot pain, right    ED Discharge Orders  Ordered    colchicine 0.6 MG tablet  2 times daily     09/20/19 2141    predniSONE (DELTASONE) 20 MG tablet  Daily     09/20/19 2141           Janeece Fitting, PA-C 09/20/19 2145    Milton Ferguson, MD 09/25/19 1020

## 2019-09-20 NOTE — ED Notes (Signed)
Pt went outside ED lobby in [facility]

## 2019-09-20 NOTE — ED Triage Notes (Signed)
Pt c/o of right foot pain x1 week. Worsening pain x2 days. Denies injury. Pt states unable to put pressure on foot.

## 2019-09-20 NOTE — Discharge Instructions (Addendum)
I have prescribed 2 medications to help with your symptoms.  Please take 2 tablets daily of steroids for the next 5 days.  Please be aware this medication can cause flushness, insomnia, appetite changes.  The second medication is colchicine, this will help with pain along with swelling.  Please take 1 tablet twice a day for the next 7 days.  Follow-up for podiatry has been given, please schedule an appointment for further follow-up.  If you experience any fever, worsening symptoms please return to the emergency department.

## 2019-10-03 DIAGNOSIS — M129 Arthropathy, unspecified: Secondary | ICD-10-CM | POA: Diagnosis not present

## 2019-10-03 DIAGNOSIS — Z1159 Encounter for screening for other viral diseases: Secondary | ICD-10-CM | POA: Diagnosis not present

## 2019-10-03 DIAGNOSIS — Z114 Encounter for screening for human immunodeficiency virus [HIV]: Secondary | ICD-10-CM | POA: Diagnosis not present

## 2019-10-03 DIAGNOSIS — Z79899 Other long term (current) drug therapy: Secondary | ICD-10-CM | POA: Diagnosis not present

## 2019-10-10 DIAGNOSIS — Z1159 Encounter for screening for other viral diseases: Secondary | ICD-10-CM | POA: Diagnosis not present

## 2019-10-10 DIAGNOSIS — K219 Gastro-esophageal reflux disease without esophagitis: Secondary | ICD-10-CM | POA: Diagnosis not present

## 2019-10-10 DIAGNOSIS — F329 Major depressive disorder, single episode, unspecified: Secondary | ICD-10-CM | POA: Diagnosis not present

## 2019-10-10 DIAGNOSIS — E559 Vitamin D deficiency, unspecified: Secondary | ICD-10-CM | POA: Diagnosis not present

## 2019-10-10 DIAGNOSIS — Z79899 Other long term (current) drug therapy: Secondary | ICD-10-CM | POA: Diagnosis not present

## 2019-10-10 DIAGNOSIS — R768 Other specified abnormal immunological findings in serum: Secondary | ICD-10-CM | POA: Diagnosis not present

## 2019-10-25 DIAGNOSIS — Z79899 Other long term (current) drug therapy: Secondary | ICD-10-CM | POA: Diagnosis not present

## 2019-11-08 DIAGNOSIS — Z79899 Other long term (current) drug therapy: Secondary | ICD-10-CM | POA: Diagnosis not present

## 2019-12-06 DIAGNOSIS — Z79899 Other long term (current) drug therapy: Secondary | ICD-10-CM | POA: Diagnosis not present

## 2020-03-09 ENCOUNTER — Encounter: Payer: Self-pay | Admitting: Internal Medicine

## 2021-07-20 ENCOUNTER — Other Ambulatory Visit: Payer: Self-pay | Admitting: Family Medicine

## 2021-07-20 DIAGNOSIS — Z1231 Encounter for screening mammogram for malignant neoplasm of breast: Secondary | ICD-10-CM

## 2021-07-22 ENCOUNTER — Inpatient Hospital Stay: Admission: RE | Admit: 2021-07-22 | Payer: Medicare Other | Source: Ambulatory Visit

## 2021-08-11 ENCOUNTER — Emergency Department (HOSPITAL_COMMUNITY): Payer: Medicare Other

## 2021-08-11 ENCOUNTER — Encounter (HOSPITAL_COMMUNITY): Payer: Self-pay | Admitting: Emergency Medicine

## 2021-08-11 ENCOUNTER — Emergency Department (HOSPITAL_COMMUNITY)
Admission: EM | Admit: 2021-08-11 | Discharge: 2021-08-11 | Disposition: A | Discharge status: Home / Self Care | Payer: Medicare Other | Attending: Emergency Medicine | Admitting: Emergency Medicine

## 2021-08-11 DIAGNOSIS — R531 Weakness: Secondary | ICD-10-CM | POA: Diagnosis present

## 2021-08-11 DIAGNOSIS — Z7902 Long term (current) use of antithrombotics/antiplatelets: Secondary | ICD-10-CM | POA: Insufficient documentation

## 2021-08-11 DIAGNOSIS — R059 Cough, unspecified: Secondary | ICD-10-CM | POA: Insufficient documentation

## 2021-08-11 DIAGNOSIS — F17210 Nicotine dependence, cigarettes, uncomplicated: Secondary | ICD-10-CM | POA: Insufficient documentation

## 2021-08-11 DIAGNOSIS — Z955 Presence of coronary angioplasty implant and graft: Secondary | ICD-10-CM | POA: Diagnosis not present

## 2021-08-11 DIAGNOSIS — Z79899 Other long term (current) drug therapy: Secondary | ICD-10-CM | POA: Insufficient documentation

## 2021-08-11 DIAGNOSIS — Z7982 Long term (current) use of aspirin: Secondary | ICD-10-CM | POA: Insufficient documentation

## 2021-08-11 DIAGNOSIS — R0602 Shortness of breath: Secondary | ICD-10-CM | POA: Diagnosis not present

## 2021-08-11 DIAGNOSIS — J449 Chronic obstructive pulmonary disease, unspecified: Secondary | ICD-10-CM | POA: Insufficient documentation

## 2021-08-11 DIAGNOSIS — R410 Disorientation, unspecified: Secondary | ICD-10-CM | POA: Diagnosis not present

## 2021-08-11 DIAGNOSIS — E876 Hypokalemia: Secondary | ICD-10-CM | POA: Insufficient documentation

## 2021-08-11 DIAGNOSIS — I10 Essential (primary) hypertension: Secondary | ICD-10-CM | POA: Insufficient documentation

## 2021-08-11 DIAGNOSIS — R29810 Facial weakness: Secondary | ICD-10-CM | POA: Insufficient documentation

## 2021-08-11 LAB — CBC
HCT: 41.9 % (ref 36.0–46.0)
Hemoglobin: 13.4 g/dL (ref 12.0–15.0)
MCH: 29.1 pg (ref 26.0–34.0)
MCHC: 32 g/dL (ref 30.0–36.0)
MCV: 90.9 fL (ref 80.0–100.0)
Platelets: 368 10*3/uL (ref 150–400)
RBC: 4.61 MIL/uL (ref 3.87–5.11)
RDW: 15.5 % (ref 11.5–15.5)
WBC: 7.9 10*3/uL (ref 4.0–10.5)
nRBC: 0 % (ref 0.0–0.2)

## 2021-08-11 LAB — COMPREHENSIVE METABOLIC PANEL
ALT: 24 U/L (ref 0–44)
AST: 26 U/L (ref 15–41)
Albumin: 3.7 g/dL (ref 3.5–5.0)
Alkaline Phosphatase: 65 U/L (ref 38–126)
Anion gap: 10 (ref 5–15)
BUN: 8 mg/dL (ref 6–20)
CO2: 26 mmol/L (ref 22–32)
Calcium: 9.2 mg/dL (ref 8.9–10.3)
Chloride: 106 mmol/L (ref 98–111)
Creatinine, Ser: 0.91 mg/dL (ref 0.44–1.00)
GFR, Estimated: >60 mL/min (ref >60)
Glucose, Bld: 118 mg/dL — ABNORMAL HIGH (ref 70–99)
Potassium: 2.9 mmol/L — ABNORMAL LOW (ref 3.5–5.1)
Sodium: 142 mmol/L (ref 135–145)
Total Bilirubin: 0.7 mg/dL (ref 0.3–1.2)
Total Protein: 7.1 g/dL (ref 6.5–8.1)

## 2021-08-11 LAB — I-STAT CHEM 8, ED
BUN: 8 mg/dL (ref 6–20)
Calcium, Ion: 1.05 mmol/L — ABNORMAL LOW (ref 1.15–1.40)
Chloride: 106 mmol/L (ref 98–111)
Creatinine, Ser: 0.8 mg/dL (ref 0.44–1.00)
Glucose, Bld: 116 mg/dL — ABNORMAL HIGH (ref 70–99)
HCT: 40 % (ref 36.0–46.0)
Hemoglobin: 13.6 g/dL (ref 12.0–15.0)
Potassium: 3 mmol/L — ABNORMAL LOW (ref 3.5–5.1)
Sodium: 145 mmol/L (ref 135–145)
TCO2: 27 mmol/L (ref 22–32)

## 2021-08-11 LAB — DIFFERENTIAL
Abs Immature Granulocytes: 0.03 10*3/uL (ref 0.00–0.07)
Basophils Absolute: 0 10*3/uL (ref 0.0–0.1)
Basophils Relative: 1 %
Eosinophils Absolute: 0.1 10*3/uL (ref 0.0–0.5)
Eosinophils Relative: 2 %
Immature Granulocytes: 0 %
Lymphocytes Relative: 31 %
Lymphs Abs: 2.5 10*3/uL (ref 0.7–4.0)
Monocytes Absolute: 0.6 10*3/uL (ref 0.1–1.0)
Monocytes Relative: 7 %
Neutro Abs: 4.7 10*3/uL (ref 1.7–7.7)
Neutrophils Relative %: 59 %

## 2021-08-11 LAB — PROTIME-INR
INR: 0.9 (ref 0.8–1.2)
Prothrombin Time: 12.4 seconds (ref 11.4–15.2)

## 2021-08-11 LAB — ETHANOL: Alcohol, Ethyl (B): <10 mg/dL (ref <10)

## 2021-08-11 LAB — APTT: aPTT: 28 seconds (ref 24–36)

## 2021-08-11 MED ORDER — POTASSIUM CHLORIDE CRYS ER 20 MEQ PO TBCR
40.0000 meq | EXTENDED_RELEASE_TABLET | Freq: Once | ORAL | Status: AC
  Administered 2021-08-11: 40 meq via ORAL
  Filled 2021-08-11: qty 2

## 2021-08-11 NOTE — ED Provider Notes (Signed)
Emergency Medicine Provider Triage Evaluation Note  Rita [name] , a 55 y.o. female  was evaluated in triage.  Pt complains of feeling off balance, right-sided weakness.  Symptoms began when she woke up this morning.  She went to bed around 10 PM last night.  She denies any vision changes, loss of vision, numbness, changes to speech.  Review of Systems  Positive: Weakness Negative: Numbness, blurry vision, loss of vision, changes to speech  Physical Exam  BP (!) 185/93 (BP Location: Left Arm)   Pulse 75   Temp 98.1 F (36.7 C) (Oral)   Resp 20   LMP 02/09/2012   SpO2 96%  Gen:   Awake, no distress   Resp:  Normal effort  MSK:   Moves extremities without difficulty  Other:  Slightly decreased strength noted in right upper extremity.  No facial asymmetry noted.  Pronator drift.  Ambulating here without difficulty or assistance.  No visual field deficit.  No neglect noted, no aphasia  Medical Decision Making  Medically screening exam initiated at 5:56 PM.  Appropriate orders placed.  Rita [name] was informed that the remainder of the evaluation will be completed by another provider, this initial triage assessment does not replace that evaluation, and the importance of remaining in the ED until their evaluation is complete.  Patient woke up with the symptoms, no indication for code stroke, she is Rita [name] negative. Stroke w/u ordered.   Rita Heady, PA-C 08/11/21 1803    Rita Bo, MD 08/13/21 [phone]

## 2021-08-11 NOTE — ED Triage Notes (Signed)
Pt arrives stating that she went to bed last night around 10pm and woke with right sided weakness, difficulty walking and left sided facial droop. Pt reports hx of stroke. Pt currently VAN neg, does have some upper extremity ataxia, mild difficulty walking. No facial droop, drift noted.

## 2021-08-11 NOTE — ED Provider Notes (Signed)
I provided a substantive portion of the care of this patient.  I personally performed the entirety of the medical decision making for this encounter.  EKG Interpretation  Date/Time:  Wednesday August 11 2021 17:57:07 EDT Ventricular Rate:  72 PR Interval:  162 QRS Duration: 150 QT Interval:  468 QTC Calculation: 512 R Axis:   -23 Text Interpretation: Normal sinus rhythm Right bundle branch block Minimal voltage criteria for LVH, may be normal variant ( R in aVL ) Abnormal ECG No significant change since last tracing Confirmed by Lacretia Leigh ([id number]) on 08/11/2021 10:24:53 PM   55 year old female presents with feeling weak and off balance.  Brain MRI negative here.  Neurological exam patient able to ambulate.  She has no focal weakness.  Will be discharged   Lacretia Leigh, MD 08/11/21 2259

## 2021-08-11 NOTE — ED Provider Notes (Signed)
7:21 PM Patient seen in conjunction with Loeffler PA-C.   Pt with h/o left occipital CVA 2020, she reports no residual deficits --presents for evaluation of left-sided facial droop, right-sided weakness first noticed after waking from sleep this morning.  Patient states that she went to bed approximately 10 PM last night and felt normal.  No code stroke activated due to being out of the window, van negative.  BP (!) 185/93 (BP Location: Left Arm)   Pulse 75   Temp 98.1 F (36.7 C) (Oral)   Resp 20   LMP 02/09/2012   SpO2 96%     Carlisle Cater, PA-C 08/11/21 2342    Lacretia Leigh, MD 08/12/21 1352

## 2021-08-11 NOTE — ED Provider Notes (Signed)
[facility] DEPART[facility] Provider Note   CSN: [id number] Arrival date & time: 08/11/21  1733     History Chief Complaint  Patient presents with   Stroke Symptoms    Rita [name] is a 55 y.o. female with a significant PMH of Tobacco use, Left sided CVA, Left subclavian artery stenosis who presents to the ED with complaints of RHB weakness and Left facial droop. Last known normal is unknown. She/ states that she went to bed at 2200 on 8/16, and her daughter came over today and noticed a left facial droop.  She states that she had a previous stroke about 10 years ago with similar symptoms. She does have some shortness of breath and cough that is not her baseline. She denies any fever or chills. She also states that she has been more confused today. She denies headache, vision changes, dizziness, syncope, chest pain, abdominal pain, nausea, and vomiting.      Past Medical History:  Diagnosis Date   Arthritis    "qwhere" (02/22/2018)   Barrett's esophagus with esophagitis 03/26/2013   Chronic abdominal pain    Chronic lower back pain    Chronic pancreatitis (HCC)    COPD (chronic obstructive pulmonary disease) (HCC)    Depression    Dyspnea    GERD (gastroesophageal reflux disease)    Heart murmur    Hiatal hernia    High cholesterol    Hypertension    Peptic ulcer    Pneumonia 2000s X 1   Tobacco use 03/26/2013    Patient Active Problem List   Diagnosis Date Noted   Cavitating mass in right lower lung lobe (PNA Vs Cancer) 01/21/2019   Gastroenteritis 01/20/2019   Left sided cerebral hemisphere cerebrovascular accident (CVA) ([place]) 01/20/2019   Stenosis of left subclavian artery ([place]) 03/19/2018   Chest pain 02/21/2018   Left subclavian artery occlusion 02/21/2018   Rectal bleeding 03/21/2017   Hiatal hernia with gastroesophageal reflux disease and esophagitis    Biliary pain    Pain of upper abdomen    Elevated liver enzymes    Chronic abdominal  pain    Pancreatitis 02/06/2015   Acute pancreatitis    Elevated LFTs    Pancreatitis, acute 05/09/2013   Anemia 03/27/2013   Esophageal dysphagia 03/26/2013   Chronic back pain 03/26/2013   GERD (gastroesophageal reflux disease) 03/26/2013   Tobacco use 03/26/2013   Barrett's esophagus with esophagitis 03/26/2013   Elevated lipase 08/20/2011    Past Surgical History:  Procedure Laterality Date   AORTIC ARCH ANGIOGRAPHY N/A 01/19/2018   Procedure: AORTIC ARCH ANGIOGRAPHY;  Surgeon: Elam Dutch, MD;  Location: [facility];  Service: Cardiovascular;  Laterality: N/A;   BIOPSY  04/17/2017   Procedure: BIOPSY;  Surgeon: Daneil Dolin, MD;  [facility];  Service: Endoscopy;;  esophageal   CARDIAC CATHETERIZATION     CAROTID-SUBCLAVIAN BYPASS GRAFT Left 03/19/2018   Procedure: BYPASS GRAFT CAROTID-SUBCLAVIAN;  Surgeon: Elam Dutch, MD;  Location: [facility];  Service: Vascular;  Laterality: Left;   [place]; 1988   COLONOSCOPY WITH PROPOFOL N/A 04/17/2017   Procedure: COLONOSCOPY WITH PROPOFOL;  Surgeon: Daneil Dolin, MD;  [facility];  Service: Endoscopy;  Laterality: N/A;  100   ESOPHAGOGASTRODUODENOSCOPY  Oct 2011   Dr. Laural Golden: ulcer in distal esophagus, soft stricture at GE junction s/p balloon dilation PATH: BARRETT'S   ESOPHAGOGASTRODUODENOSCOPY (EGD) WITH ESOPHAGEAL DILATION N/A 03/27/2013  Procedure: ESOPHAGOGASTRODUODENOSCOPY (EGD) WITH ESOPHAGEAL DILATION;  Surgeon: Daneil Dolin, MD;  [facility];  Service: Endoscopy;  Laterality: N/A;  possible dilation   ESOPHAGOGASTRODUODENOSCOPY (EGD) WITH PROPOFOL N/A 01/02/2017   Procedure: ESOPHAGOGASTRODUODENOSCOPY (EGD) WITH PROPOFOL;  Surgeon: Daneil Dolin, MD;  [facility];  Service: Endoscopy;  Laterality: N/A;  with possible esophageal dilation   ESOPHAGOGASTRODUODENOSCOPY (EGD) WITH PROPOFOL N/A 04/17/2017   Procedure: ESOPHAGOGASTRODUODENOSCOPY (EGD) WITH  PROPOFOL;  Surgeon: Daneil Dolin, MD;  [facility];  Service: Endoscopy;  Laterality: N/A;   EUS N/A 05/09/2013   Procedure: UPPER ENDOSCOPIC ULTRASOUND (EUS) LINEAR;  Surgeon: Milus Banister, MD;  Location: [facility];  Service: Endoscopy;  Laterality: N/A;   FRACTURE SURGERY     pt. denies   KNEE ARTHROSCOPY Right    LAPAROSCOPIC CHOLECYSTECTOMY     LEFT HEART CATH AND CORONARY ANGIOGRAPHY N/A 02/26/2018   Procedure: LEFT HEART CATH AND CORONARY ANGIOGRAPHY;  Surgeon: Burnell Blanks, MD;  Location: [facility];  Service: Cardiovascular;  Laterality: N/A;   MALONEY DILATION N/A 04/17/2017   Procedure: Venia Minks DILATION;  Surgeon: Daneil Dolin, MD;  [facility];  Service: Endoscopy;  Laterality: N/A;   PATELLA FRACTURE SURGERY Left    POLYPECTOMY  04/17/2017   Procedure: POLYPECTOMY;  Surgeon: Daneil Dolin, MD;  [facility];  Service: Endoscopy;;   TEE WITHOUT CARDIOVERSION N/A 01/23/2019   Procedure: TRANSESOPHAGEAL ECHOCARDIOGRAM (TEE);  Surgeon: Jerline Pain, MD;  [facility];  Service: Cardiovascular;  Laterality: N/A;   [place] N/A 01/19/2018   Procedure: UPPER EXTREMITY ANGIOGRAPHY;  Surgeon: Elam Dutch, MD;  Location: [facility];  Service: Cardiovascular;  Laterality: N/A;     OB History     Gravida  4   Para  3   Term  3   Preterm      AB  1   Living  3      SAB  1   IAB      Ectopic      Multiple      Live Births              Family History  Problem Relation Age of Onset   Asthma Mother    Heart failure Mother    Cancer Mother        pancreatic   Diabetes Mother    Hypertension Mother    Stroke Mother    Pancreatic cancer Mother        deceased   Heart failure Father    Diabetes Father    Colon cancer Neg Hx     Social History   Tobacco Use   Smoking status: Every Day    Packs/day: 0.50    Years: 41.00    Pack  years: 20.50    Types: Cigarettes    Start date: 09/26/1982   Smokeless tobacco: Never  Vaping Use   Vaping Use: Former  Substance Use Topics   Alcohol use: No   Drug use: No    Home Medications Prior to Admission medications   Medication Sig Start Date End Date Taking? Authorizing Provider  amLODipine (NORVASC) 5 MG tablet Take 1 tablet (5 mg total) by mouth daily. Patient taking differently: Take 5 mg by mouth at bedtime.  03/06/18   Caren Macadam, MD  aspirin EC 81 MG tablet Take 1 tablet (81 mg  total) by mouth daily. Patient taking differently: Take 81 mg by mouth at bedtime.  03/06/18   Caren Macadam, MD  atorvastatin (LIPITOR) 80 MG tablet Take 1 tablet (80 mg total) by mouth daily at 6 PM. 01/24/19   Danford, Suann Larry, MD  Buprenorphine HCl-Naloxone HCl 8-2 MG FILM Take 1 Film by mouth daily. 1/2 film under the tongue 12/14/18   [provider]  clopidogrel (PLAVIX) 75 MG tablet Take 1 tablet (75 mg total) by mouth daily. 01/24/19   Danford, Suann Larry, MD  colchicine 0.6 MG tablet Take 1 tablet (0.6 mg total) by mouth 2 (two) times daily for 7 days. 09/20/19 09/27/19  Janeece Fitting, PA-C  DEXILANT 60 MG capsule TAKE 1 CAPSULE(60 MG) BY MOUTH AT BEDTIME 02/19/19   Annitta Needs, NP  Oxycodone HCl 10 MG TABS Take 1 tablet (10 mg total) by mouth every 6 (six) hours as needed. Patient not taking: Reported on 02/13/2019 03/21/18   Ulyses Amor, PA-C  potassium chloride SA (K-DUR,KLOR-CON) 20 MEQ tablet Take 20 mEq by mouth at bedtime.     [provider]  saccharomyces boulardii (FLORASTOR) 250 MG capsule Take 1 capsule (250 mg total) by mouth 2 (two) times daily. Patient not taking: Reported on 02/13/2019 01/24/19   Edwin Dada, MD    Allergies    Patient has no known allergies.  Review of Systems   Review of Systems  Constitutional:  Negative for chills, fatigue and fever.  HENT:  Negative for congestion.   Eyes:  Negative for visual disturbance.   Respiratory:  Positive for cough and shortness of breath.   Cardiovascular:  Negative for chest pain.  Gastrointestinal:  Negative for abdominal pain, diarrhea, nausea and vomiting.  Genitourinary: Negative.   Musculoskeletal: Negative.   Skin: Negative.   Neurological:  Positive for facial asymmetry, speech difficulty and weakness. Negative for dizziness, syncope, light-headedness, numbness and headaches.  Psychiatric/Behavioral:  Positive for confusion.    Physical Exam Updated Vital Signs BP 140/90   Pulse 62   Temp 98.1 F (36.7 C) (Oral)   Resp 18   LMP 02/09/2012   SpO2 97%   Physical Exam Vitals reviewed.  Constitutional:      General: She is not in acute distress. HENT:     Head: Normocephalic and atraumatic.     Nose: No congestion or rhinorrhea.  Eyes:     General: Visual field deficit present. No scleral icterus.    Extraocular Movements: Extraocular movements intact.     Conjunctiva/sclera: Conjunctivae normal.     Pupils: Pupils are equal, round, and reactive to light.  Cardiovascular:     Rate and Rhythm: Normal rate and regular rhythm.     Pulses: Normal pulses.     Heart sounds: Normal heart sounds. No murmur heard.   No friction rub. No gallop.  Pulmonary:     Effort: Pulmonary effort is normal. No respiratory distress.     Breath sounds: Rhonchi present.  Abdominal:     General: Abdomen is flat. Bowel sounds are normal. There is no distension.     Palpations: Abdomen is soft.     Tenderness: There is no abdominal tenderness.  Skin:    General: Skin is warm and dry.  Neurological:     Mental Status: She is alert.     GCS: GCS eye subscore is 4. GCS verbal subscore is 4. GCS motor subscore is 6.     Cranial Nerves: Dysarthria and facial asymmetry  present.     Sensory: Sensory deficit present.     Motor: Weakness present. No pronator drift.     Comments: Right Visual Field Deficit Mild Left Facial droop Strength: RUE: 4/5, LUE: 5/5, RLE: 4/5, LLE:  5/5 Sensation: decreased on the right side.      ED Results / Procedures / Treatments   Labs (all labs ordered are listed, but only abnormal results are displayed) Labs Reviewed  COMPREHENSIVE METABOLIC PANEL - Abnormal; Notable for the following components:      Result Value   Potassium 2.9 (*)    Glucose, Bld 118 (*)    All other components within normal limits  I-STAT CHEM 8, ED - Abnormal; Notable for the following components:   Potassium 3.0 (*)    Glucose, Bld 116 (*)    Calcium, Ion 1.05 (*)    All other components within normal limits  ETHANOL  PROTIME-INR  APTT  CBC  DIFFERENTIAL    EKG EKG Interpretation  Date/Time:  Wednesday August 11 2021 17:57:07 EDT Ventricular Rate:  72 PR Interval:  162 QRS Duration: 150 QT Interval:  468 QTC Calculation: 512 R Axis:   -23 Text Interpretation: Normal sinus rhythm Right bundle branch block Minimal voltage criteria for LVH, may be normal variant ( R in aVL ) Abnormal ECG No significant change since last tracing Confirmed by Lacretia Leigh ([id number]) on 08/11/2021 10:45:46 PM  Radiology CT HEAD WO CONTRAST  Result Date: 08/11/2021 CLINICAL DATA:  Transient ischemic attack EXAM: CT HEAD WITHOUT CONTRAST TECHNIQUE: Contiguous axial images were obtained from the base of the skull through the vertex without intravenous contrast. COMPARISON:  01/20/2019 FINDINGS: Brain: There are old bilateral cerebellar and left occipital infarcts. No acute hemorrhage. There is periventricular hypoattenuation compatible with chronic microvascular disease. Vascular: No abnormal hyperdensity of the major intracranial arteries or dural venous sinuses. No intracranial atherosclerosis. Skull: The visualized skull base, calvarium and extracranial soft tissues are normal. Sinuses/Orbits: No fluid levels or advanced mucosal thickening of the visualized paranasal sinuses. No mastoid or middle ear effusion. The orbits are normal. IMPRESSION: 1. No acute  intracranial abnormality. 2. Old bilateral cerebellar and left occipital infarcts and findings of chronic microvascular ischemia. Electronically Signed   By: Ulyses Jarred M.D.   On: 08/11/2021 19:34   MR BRAIN WO CONTRAST  Result Date: 08/11/2021 CLINICAL DATA:  Left facial weakness and right arm weakness EXAM: MRI HEAD WITHOUT CONTRAST TECHNIQUE: Multiplanar, multiecho pulse sequences of the brain and surrounding structures were obtained without intravenous contrast. COMPARISON:  None. FINDINGS: Brain: No acute infarct, mass effect or extra-axial collection. No acute or chronic hemorrhage. Old infarcts of the left occipital lobe, left corona radiata and both cerebellar hemispheres. Multifocal white matter hyperintense T2-weighted signal. Volume loss slightly greater than expected for age. The midline structures are normal. Vascular: Major flow voids are preserved. Skull and upper cervical spine: Normal calvarium and skull base. Visualized upper cervical spine and soft tissues are normal. Sinuses/Orbits:No paranasal sinus fluid levels or advanced mucosal thickening. No mastoid or middle ear effusion. Normal orbits. IMPRESSION: 1. No acute intracranial abnormality. 2. Old infarcts of the left occipital lobe, left corona radiata and the cerebellum. Electronically Signed   By: Ulyses Jarred M.D.   On: 08/11/2021 22:07    Procedures Procedures   Medications Ordered in ED Medications  potassium chloride SA (KLOR-CON) CR tablet 40 mEq (has no administration in time range)    ED Course  I have reviewed the triage vital  signs and the nursing notes.  Pertinent labs & imaging results that were available during my care of the patient were reviewed by me and considered in my medical decision making (see chart for details).  Clinical Course as of 08/11/21 2319  Wed Aug 11, 2021  1845 Patient seen and evaluated  [GL]  1846 PE notable for RHB weakness and sensation deficit. Mild Right facial droop. PERRLA.  EOM intact. Alert and oriented x 2. Right visual field deficit. Denies drinking alcohol. Has hx of prior stroke several years ago. Awaiting CT head. LKN 10 pm last night.   [GL]  1952 CTH negative for acute findings. MRI pending [GL]  2224 MRI negative for stroke  [GL]  2255 Reassessed patient. RUE weakness improved. RLE weakness still present. Patient is able to ambulate around room with some wobbliness.  [GL]  2257 Dr. Zenia Resides saw patient and deemed her stable for discharge.  [GL]  2259 Given Potassium supplement prior to discharge  [GL]    Clinical Course User Index [GL] Majed Pellegrin, Adora Fridge, PA-C   MDM Rules/Calculators/A&P                           This is a 55 y.o. female. Presented to ED with left facial droop, right sided weakness with LKN 10 pm last night. Vital Signs stable upon admission. Hypertensive at 185/93, however with concern for CVA, we will allow for permissive hypertension. PE significant for minimal left facial droop, 4/5 strength on right arm and right leg. No drift noted. Mild sensory deficit on right arm and left when compared to right. Right lower visual field deficit. No aphasia. She is oriented x 2 (not oriented to time). Concerned for CVA given previous history of stroke and current focal deficits that are not baseline, however patient is outside of the TPA window.   Labs and Imaging reviewed. Labs notable for hypokalemia which appears to be chronic. No hypoglycemia. No CBC abnormalities. Initial CT head negative for acute abnormalities. Evidence of old bilateral cerebellar and left occipital infarcts found along with evidence of chronic microvascular ischemia . She is VAN negative so will not obtain CTA head and neck.  MRI resulted no new infarct. On reexamination neuro exam is much improved. Still mild RLE weakness. She is able to walk without assistance. Replaced Potassium. Instructed to follow up with PCP and given return precautions. Dr. Zenia Resides saw patient and deemed her  to be stable for discharge.    Final Clinical Impression(s) / ED Diagnoses Final diagnoses:  Right sided weakness    Rx / DC Orders ED Discharge Orders     None        Adolphus Birchwood, PA-C 08/11/21 2320    Lacretia Leigh, MD 08/12/21 1352

## 2021-08-11 NOTE — ED Notes (Signed)
Pt transported to MRI 

## 2021-08-12 ENCOUNTER — Other Ambulatory Visit: Payer: Self-pay

## 2021-08-12 DIAGNOSIS — I771 Stricture of artery: Secondary | ICD-10-CM

## 2021-08-13 ENCOUNTER — Ambulatory Visit (HOSPITAL_COMMUNITY)
Admission: RE | Admit: 2021-08-13 | Discharge: 2021-08-13 | Disposition: A | Discharge status: Home / Self Care | Payer: Medicare Other | Source: Ambulatory Visit | Attending: Vascular Surgery | Admitting: Vascular Surgery

## 2021-08-13 ENCOUNTER — Ambulatory Visit (INDEPENDENT_AMBULATORY_CARE_PROVIDER_SITE_OTHER): Payer: Medicare Other | Admitting: Vascular Surgery

## 2021-08-13 ENCOUNTER — Encounter: Payer: Self-pay | Admitting: Vascular Surgery

## 2021-08-13 ENCOUNTER — Other Ambulatory Visit: Payer: Self-pay

## 2021-08-13 VITALS — BP 167/103 | HR 71 | Temp 98.0°F | Resp 20 | Ht 67.0 in | Wt 202.0 lb

## 2021-08-13 DIAGNOSIS — I771 Stricture of artery: Secondary | ICD-10-CM | POA: Diagnosis present

## 2021-08-13 DIAGNOSIS — I6523 Occlusion and stenosis of bilateral carotid arteries: Secondary | ICD-10-CM | POA: Diagnosis not present

## 2021-08-13 NOTE — Progress Notes (Signed)
Patient ID: Rita [name], female   DOB: September 07, 1966, 55 y.o.   MRN: [id number]  Reason for Consult: Follow-up   Referred by Center, [facility]  Subjective:     HPI:  Rita [name] is a 55 y.o. female has a previous history of a left carotid to subclavian artery bypass with Dacron graft by Dr. Oneida Alar performed in 2019.  Her last follow-up with [place] was in 2019.  More recently she was evaluated in the emergency department for right lower extremity weakness and left facial droop.  An MRI was performed which demonstrated old occipital infarcts no new strokes.  Over the past couple days she has improved although she remains somewhat confused.  She states that she is feels like she still has left lip droop but she has her strength back in her lower extremities.  She states that she recently had some medications changed but she is unsure what these medications were.  She thinks that she does take aspirin, Plavix and a statin.  She denies any weakness at this time but states that she is feels somewhat "foggy."  Past Medical History:  Diagnosis Date   Arthritis    "qwhere" (02/22/2018)   Barrett's esophagus with esophagitis 03/26/2013   Chronic abdominal pain    Chronic lower back pain    Chronic pancreatitis (HCC)    COPD (chronic obstructive pulmonary disease) (HCC)    Depression    Dyspnea    GERD (gastroesophageal reflux disease)    Heart murmur    Hiatal hernia    High cholesterol    Hypertension    Peptic ulcer    Pneumonia 2000s X 1   Stroke ([place])    Tobacco use 03/26/2013   Family History  Problem Relation Age of Onset   Asthma Mother    Heart failure Mother    Cancer Mother        pancreatic   Diabetes Mother    Hypertension Mother    Stroke Mother    Pancreatic cancer Mother        deceased   Heart failure Father    Diabetes Father    Colon cancer Neg Hx    Past Surgical History:  Procedure Laterality Date   AORTIC ARCH ANGIOGRAPHY N/A 01/19/2018   Procedure:  AORTIC ARCH ANGIOGRAPHY;  Surgeon: Elam Dutch, MD;  Location: [facility];  Service: Cardiovascular;  Laterality: N/A;   BIOPSY  04/17/2017   Procedure: BIOPSY;  Surgeon: Daneil Dolin, MD;  [facility];  Service: Endoscopy;;  esophageal   CARDIAC CATHETERIZATION     CAROTID-SUBCLAVIAN BYPASS GRAFT Left 03/19/2018   Procedure: BYPASS GRAFT CAROTID-SUBCLAVIAN;  Surgeon: Elam Dutch, MD;  Location: [place];  Service: Vascular;  Laterality: Left;   [place]; 1988   COLONOSCOPY WITH PROPOFOL N/A 04/17/2017   Procedure: COLONOSCOPY WITH PROPOFOL;  Surgeon: Daneil Dolin, MD;  [facility];  Service: Endoscopy;  Laterality: N/A;  100   ESOPHAGOGASTRODUODENOSCOPY  Oct 2011   Dr. Laural Golden: ulcer in distal esophagus, soft stricture at GE junction s/p balloon dilation PATH: BARRETT'S   ESOPHAGOGASTRODUODENOSCOPY (EGD) WITH ESOPHAGEAL DILATION N/A 03/27/2013   Procedure: ESOPHAGOGASTRODUODENOSCOPY (EGD) WITH ESOPHAGEAL DILATION;  Surgeon: Daneil Dolin, MD;  [facility];  Service: Endoscopy;  Laterality: N/A;  possible dilation   ESOPHAGOGASTRODUODENOSCOPY (EGD) WITH PROPOFOL N/A 01/02/2017   Procedure: ESOPHAGOGASTRODUODENOSCOPY (EGD) WITH PROPOFOL;  Surgeon: Daneil Dolin, MD;  [facility];  Service: Endoscopy;  Laterality: N/A;  with possible esophageal dilation   ESOPHAGOGASTRODUODENOSCOPY (EGD) WITH PROPOFOL N/A 04/17/2017   Procedure: ESOPHAGOGASTRODUODENOSCOPY (EGD) WITH PROPOFOL;  Surgeon: Daneil Dolin, MD;  [facility];  Service: Endoscopy;  Laterality: N/A;   EUS N/A 05/09/2013   Procedure: UPPER ENDOSCOPIC ULTRASOUND (EUS) LINEAR;  Surgeon: Milus Banister, MD;  Location: [facility];  Service: Endoscopy;  Laterality: N/A;   FRACTURE SURGERY     pt. denies   KNEE ARTHROSCOPY Right    LAPAROSCOPIC CHOLECYSTECTOMY     LEFT HEART CATH AND CORONARY ANGIOGRAPHY N/A 02/26/2018   Procedure: LEFT HEART CATH AND  CORONARY ANGIOGRAPHY;  Surgeon: Burnell Blanks, MD;  Location: [place] [facility];  Service: Cardiovascular;  Laterality: N/A;   MALONEY DILATION N/A 04/17/2017   Procedure: Venia Minks DILATION;  Surgeon: Daneil Dolin, MD;  [facility];  Service: Endoscopy;  Laterality: N/A;   PATELLA FRACTURE SURGERY Left    POLYPECTOMY  04/17/2017   Procedure: POLYPECTOMY;  Surgeon: Daneil Dolin, MD;  [facility];  Service: Endoscopy;;   TEE WITHOUT CARDIOVERSION N/A 01/23/2019   Procedure: TRANSESOPHAGEAL ECHOCARDIOGRAM (TEE);  Surgeon: Jerline Pain, MD;  Location[facility];  Service: Cardiovascular;  Laterality: N/A;   [place]ort N/A 01/19/2018   Procedure: UPPER EXTREMITY ANGIOGRAPHY;  Surgeon: Elam Dutch, MD;  Location: [place] [facility];  Service: Cardiovascular;  Laterality: N/A;    Short Social History:  Social History   Tobacco Use   Smoking status: Every Day    Packs/day: 0.50    Years: 41.00    Pack years: 20.50    Types: Cigarettes    Start date: 09/26/1982   Smokeless tobacco: Never  Substance Use Topics   Alcohol use: No    No Known Allergies  Current Outpatient Medications  Medication Sig Dispense Refill   amLODipine (NORVASC) 5 MG tablet Take 1 tablet (5 mg total) by mouth daily. (Patient taking differently: Take 5 mg by mouth at bedtime.) 30 tablet 0   aspirin EC 81 MG tablet Take 1 tablet (81 mg total) by mouth daily. (Patient taking differently: Take 81 mg by mouth at bedtime.)     atorvastatin (LIPITOR) 80 MG tablet Take 1 tablet (80 mg total) by mouth daily at 6 PM. 30 tablet 0   Buprenorphine HCl-Naloxone HCl 8-2 MG FILM Take 1 Film by mouth daily. 1/2 film under the tongue     clopidogrel (PLAVIX) 75 MG tablet Take 1 tablet (75 mg total) by mouth daily. 30 tablet 3   DEXILANT 60 MG capsule TAKE 1 CAPSULE(60 MG) BY MOUTH AT BEDTIME 90 capsule 3   Oxycodone HCl 10 MG TABS Take 1 tablet (10 mg  total) by mouth every 6 (six) hours as needed. 10 tablet 0   potassium chloride SA (K-DUR,KLOR-CON) 20 MEQ tablet Take 20 mEq by mouth at bedtime.      saccharomyces boulardii (FLORASTOR) 250 MG capsule Take 1 capsule (250 mg total) by mouth 2 (two) times daily.     colchicine 0.6 MG tablet Take 1 tablet (0.6 mg total) by mouth 2 (two) times daily for 7 days. 14 tablet 0   No current facility-administered medications for this visit.    Review of Systems  Constitutional: Positive for fever.  Eyes: Positive for visual disturbance.   Respiratory: Respiratory negative.  Cardiovascular: Cardiovascular negative.  GI: Gastrointestinal negative.  Musculoskeletal:  Musculoskeletal negative.  Skin: Skin negative.  Neurological: Positive for facial asymmetry and focal weakness.  Psychiatric: Positive for confusion.       Objective:  Objective   Vitals:   08/13/21 1008 08/13/21 1009  BP: (!) 175/110 (!) 167/103  Pulse: 71   Resp: 20   Temp: 98 F (36.7 C)   SpO2: 95%   Weight: 202 lb (91.6 kg)   Height: '5\' 7"'$  (1.702 m)    Body mass index is 31.64 kg/m.  Physical Exam HENT:     Head: Normocephalic.     Nose:     Comments: Wearing a mask    Mouth/Throat:     Comments: Smile is symmetric, tongue is midline Eyes:     Pupils: Pupils are equal, round, and reactive to light.  Neck:     Vascular: No carotid bruit.  Cardiovascular:     Rate and Rhythm: Normal rate.     Pulses:          Radial pulses are 2+ on the right side and 2+ on the left side.  Pulmonary:     Effort: Pulmonary effort is normal.  Abdominal:     General: Abdomen is flat.     Palpations: Abdomen is soft.  Musculoskeletal:        General: No swelling.     Cervical back: Neck supple.  Skin:    General: Skin is warm and dry.     Capillary Refill: Capillary refill takes less than 2 seconds.  Neurological:     General: No focal deficit present.     Mental Status: She is alert and oriented to person, place, and  time.     Sensory: No sensory deficit.     Motor: No weakness.  Psychiatric:        Mood and Affect: Mood normal.        Behavior: Behavior normal.        Thought Content: Thought content normal.    Data: MRI IMPRESSION: 1. No acute intracranial abnormality. 2. Old infarcts of the left occipital lobe, left corona radiata and the cerebellum.  Right Carotid Findings:  +----------+--------+--------+--------+------------------+--------+            PSV cm/sEDV cm/sStenosisPlaque DescriptionComments  +----------+--------+--------+--------+------------------+--------+  CCA Prox  117     29                                          +----------+--------+--------+--------+------------------+--------+  CCA Mid   83      26                                          +----------+--------+--------+--------+------------------+--------+  CCA Distal81      29              heterogenous                +----------+--------+--------+--------+------------------+--------+  ICA Prox  97      42      1-39%   heterogenous                +----------+--------+--------+--------+------------------+--------+  ICA Mid   96      43                                          +----------+--------+--------+--------+------------------+--------+  ICA Distal95      42                                          +----------+--------+--------+--------+------------------+--------+  ECA       173     40                                          +----------+--------+--------+--------+------------------+--------+   +----------+--------+-------+----------------+-------------------+            PSV cm/sEDV cmsDescribe        Arm Pressure (mmHG)  +----------+--------+-------+----------------+-------------------+  [id number]            Multiphasic, WNL                     +----------+--------+-------+----------------+-------------------+    +---------+--------+--+--------+-+------------------------+  VertebralPSV cm/s18EDV cm/s4Antegrade and diminished  +---------+--------+--+--------+-+------------------------+       Left Carotid Findings:  +----------+--------+--------+--------+------------------+--------+            PSV cm/sEDV cm/sStenosisPlaque DescriptionComments  +----------+--------+--------+--------+------------------+--------+  CCA Prox  212     35                                          +----------+--------+--------+--------+------------------+--------+  CCA Mid   103     30              heterogenous                +----------+--------+--------+--------+------------------+--------+  CCA Distal103     23              heterogenous                +----------+--------+--------+--------+------------------+--------+  ICA Prox  87      26      1-39%   heterogenous                +----------+--------+--------+--------+------------------+--------+  ICA Mid   126     59      40-59%                              +----------+--------+--------+--------+------------------+--------+  ICA Distal121     56      40-59%                              +----------+--------+--------+--------+------------------+--------+  ECA       112     24                                          +----------+--------+--------+--------+------------------+--------+   +----------+--------+--------+----------------+-------------------+            PSV cm/sEDV cm/sDescribe        Arm Pressure (mmHG)  +----------+--------+--------+----------------+-------------------+  [id number]             Multiphasic, WNL                     +----------+--------+--------+----------------+-------------------+   +---------+--------+--+--------+--+---------+  VertebralPSV cm/s83EDV cm/s30Antegrade  +---------+--------+--+--------+--+---------+  Left Graft #1:  +--------------------+---++---------++  Inflow              168triphasic  +--------------------+---++---------++  Proximal Anastomosis232triphasic  +--------------------+---++---------++  Proximal Graft      270triphasic  +--------------------+---++---------++  Mid Graft           138triphasic  +--------------------+---++---------++  Distal Graft        210triphasic  +--------------------+---++---------++  Distal Anastamosis  220triphasic  +--------------------+---++---------++  Outflow             197triphasic  +--------------------+---++---------++       Summary:  Right Carotid: Velocities in the right ICA are consistent with a 1-39%  stenosis.   Left Carotid: Velocities in the left ICA are consistent with a 40-59%  stenosis                in the mid and distal segments.. Patent carotid to  subclavian                bypass graft.   Vertebrals:  Bilateral vertebral arteries demonstrate antegrade flow.  Right               verterbral flow is diminished.  Subclavians: Normal flow hemodynamics were seen in bilateral subclavian               arteries.      Assessment/Plan:     55 year old female presents for evaluation of her carotid artery disease with known left carotid to subclavian bypass.  She still appears a little bit disoriented but is able to orient easily.  She does not have any focal deficits although she feels as though she has left sided lip droop she does have a symmetric smile and strength is preserved in her bilateral upper and lower extremities and she is walking without any assistance.  Recent MRI demonstrated no CVA.  She does have stenosis identified on her carotid duplex today on the left side nothing appears acute given that she has not had a stroke does not really appear to have a TIA I have not recommended any intervention.  She will continue medical therapy with aspirin, Plavix and statin.  I discussed with the  patient and her significant other the need for evaluation by primary care and if her situation worsens to seek emergent medical care again in the emergency department.  They are understandably frustrated by this but I see no need for vascular invention at this time and she can follow-up with [place] in 1 year with repeat duplex.     Waynetta Sandy MD [facility]

## 2021-08-19 NOTE — Progress Notes (Addendum)
NEUROLOGY CONSULTATION NOTE  Rita [name] MRN: [id number] DOB: 12/18/1966  Referring provider: Andy Gauss, NP Primary care provider: [facility]  Reason for consult:  TIA  Assessment/Plan:   Transient ischemic attack presenting with left facial droop, possible aphasia and worsening right sided weakness in patient with previous history of left hemispheric watershed infarcts.  ED note clearly indicates objective lateralizing weakness.  She still endorses mild weakness but not appreciated on my exam.  Constellation of symptoms does not localize to one area of the brain but she does have multivessel intracranial atherosclerosis and stenosis.  The questionable aphasia and worsening right sided weakness may be due to left MCA or left ICA syndrome.  Left facial weakness may be related to right MCA stenosis.   Intracranial stenosis Moderate left ICA stenosis Hypertension Hyperlipidemia Tobacco use disorder Palpitations.  I agree with vascular surgery to favor maximum medical management rather than intervention of the left ICA stenosis, which is moderate at best.  Medical management of secondary stroke prevention should be followed by her PCP: This event occurred off antiplatelet therapy.  She was advised to continue both ASA '81mg'$  and Plavix '75mg'$  daily for 3 months (in case the ICA stenosis may have contributed to aphasia and right sided weakness), followed by ASA '81mg'$  daily alone. Atorvastatin '80mg'$  daily.  LDL goal less than 70 Normotensive blood pressure Hgb A1c goal less than 7 Discussed smoking cessation Mediterranean diet I would still like to check CTA of head and neck. Due to palpitations, will refer to cardiology. Follow up 6 months.   Subjective:  Rita [name] is a 55 year old right-handed female with COPD, HTN, high cholesterol, chronic low back and abdominal pain, tobacco use and history of prior CVA who presents for transient ischemic attack.  History  supplemented by ED note.  CT and MRI of brain performed in ED personally reviewed.  Patient has history of left embolic sided occipital and ACA/MCA watershed infarcts in January 2020 presenting with right sided hemianopsia, mild right sided facial weakness and right sided sensory deficits.  MRA of head and neck at that time demonstrated multivessel intracranial stenosis.  Workup, including TEE, did not reveal definitive cardiac source. Since then, she reported maybe slight residual right upper and lower extremity weakness  She subsequently had stopped a lot of her medications and hadn't been on antiplatelet therapy or statin.  On 08/11/2021, she woke up that morning and noted having left-sided facial droop and confusion.  She was slurring her words and hand trouble understanding what people were saying to her.  She also didn't always make sense when she spoke.  She also noted worsening right sided upper and lower extremity weakness (last known normal at 10 PM prior night).  She went to the [facility] ED for stroke evaluation.  She was outside the window for IV tPA.  As per ED provider's exam, she had left sided facial droop, right sided upper and lower extremity weakness and numbness, dysarthria (but no aphasia) and right-sided visual field deficit.  CT head showed remote bilateral cerebellar infarcts and old left occipital lobe infarcts but no acute abnormalities.  MRI of brain showed no acute infarcts.  Repeat exam reportedly improved.  Due to negative VAN score, CTA of head and neck was deferred and she was discharged with outpatient PCP follow up.  She followed up with her vascular surgeon.  Carotid ultrasound on 08/13/2021 showed new 40-59% stenosis in the mid and distal segments of the  left ICA.  She followed up with her vascular surgeon.  As her symptoms were subjective and did not exhibit any clear objective focal abnormalities, intervention was not recommended.  Symptoms have greatly improved.  She  says she sometimes still may have trouble understanding others, usually in the evenings.  She thinks she has a slight left facial droop and right leg still feels slightly weak.    She also endorses palpitations which have progressively gotten worse.  They now occur daily.     08/13/2021 CAROTID U/S:  Right Carotid: Velocities in the right ICA are consistent with a 1-39%  stenosis. Left Carotid: Velocities in the left ICA are consistent with a 40-59% stenosis in the mid and distal segments.. Patent carotid to subclavian bypass graft.  Vertebrals:  Bilateral vertebral arteries demonstrate antegrade flow. Right verterbral flow is diminished.  Subclavians: Normal flow hemodynamics were seen in bilateral subclavian arteries.  08/11/2021 MRI BRAIN:  No acute intracranial abnormality.  Old infarcts of the left occipital lobe, left corona radiata and the bilateral cerebellar hemispheres 01/21/2019 MRI BRAIN:  Acute/subacute nonhemorrhagic left occipital lobe infarct. This may be a lateral PCA or watershed infarct.  Additional acute/subacute nonhemorrhagic left ACA/MCA watershed infarcts over the left convexity.  The brainstem and cerebellum are within normal limits.  Subcortical white matter disease is mildly advanced for age. This likely reflects the sequela of chronic microvascular ischemia. 01/21/2019 MRA HEAD & NECK:  Distal left vertebral artery occlusion with slow/reversed flow in the left VA proximal to the dural segment.  Moderate distal right M1 segment stenosis.  Moderate distal left A1 segment stenosis.  Mild irregularity in the basilar artery and proximal PCA vessels bilaterally with asymmetric attenuation of the distal branches, right greater than left.  PAST MEDICAL HISTORY: Past Medical History:  Diagnosis Date   Arthritis    "qwhere" (02/22/2018)   Barrett's esophagus with esophagitis 03/26/2013   Chronic abdominal pain    Chronic lower back pain    Chronic pancreatitis (HCC)    COPD  (chronic obstructive pulmonary disease) (HCC)    Depression    Dyspnea    GERD (gastroesophageal reflux disease)    Heart murmur    Hiatal hernia    High cholesterol    Hypertension    Peptic ulcer    Pneumonia 2000s X 1   Stroke ([place])    Tobacco use 03/26/2013    PAST SURGICAL HISTORY: Past Surgical History:  Procedure Laterality Date   AORTIC ARCH ANGIOGRAPHY N/A 01/19/2018   Procedure: AORTIC ARCH ANGIOGRAPHY;  Surgeon: Elam Dutch, MD;  Location: [facility];  Service: Cardiovascular;  Laterality: N/A;   BIOPSY  04/17/2017   Procedure: BIOPSY;  Surgeon: Daneil Dolin, MD;  [facility];  Service: Endoscopy;;  esophageal   CARDIAC CATHETERIZATION     CAROTID-SUBCLAVIAN BYPASS GRAFT Left 03/19/2018   Procedure: BYPASS GRAFT CAROTID-SUBCLAVIAN;  Surgeon: Elam Dutch, MD;  Location: [facility];  Service: Vascular;  Laterality: Left;   [place]; 1988   COLONOSCOPY WITH PROPOFOL N/A 04/17/2017   Procedure: COLONOSCOPY WITH PROPOFOL;  Surgeon: Daneil Dolin, MD;  [facility];  Service: Endoscopy;  Laterality: N/A;  100   ESOPHAGOGASTRODUODENOSCOPY  Oct 2011   Dr. Laural Golden: ulcer in distal esophagus, soft stricture at GE junction s/p balloon dilation PATH: BARRETT'S   ESOPHAGOGASTRODUODENOSCOPY (EGD) WITH ESOPHAGEAL DILATION N/A 03/27/2013   Procedure: ESOPHAGOGASTRODUODENOSCOPY (EGD) WITH ESOPHAGEAL DILATION;  Surgeon: Daneil Dolin, MD;  [facility];  Service: Endoscopy;  Laterality: N/A;  possible dilation   ESOPHAGOGASTRODUODENOSCOPY (EGD) WITH PROPOFOL N/A 01/02/2017   Procedure: ESOPHAGOGASTRODUODENOSCOPY (EGD) WITH PROPOFOL;  Surgeon: Daneil Dolin, MD;  [facility];  Service: Endoscopy;  Laterality: N/A;  with possible esophageal dilation   ESOPHAGOGASTRODUODENOSCOPY (EGD) WITH PROPOFOL N/A 04/17/2017   Procedure: ESOPHAGOGASTRODUODENOSCOPY (EGD) WITH PROPOFOL;  Surgeon: Daneil Dolin, MD;  [facility];  Service: Endoscopy;  Laterality: N/A;   EUS N/A 05/09/2013   Procedure: UPPER ENDOSCOPIC ULTRASOUND (EUS) LINEAR;  Surgeon: Milus Banister, MD;  Location: [facility];  Service: Endoscopy;  Laterality: N/A;   FRACTURE SURGERY     pt. denies   KNEE ARTHROSCOPY Right    LAPAROSCOPIC CHOLECYSTECTOMY     LEFT HEART CATH AND CORONARY ANGIOGRAPHY N/A 02/26/2018   Procedure: LEFT HEART CATH AND CORONARY ANGIOGRAPHY;  Surgeon: Burnell Blanks, MD;  Location: [facility];  Service: Cardiovascular;  Laterality: N/A;   MALONEY DILATION N/A 04/17/2017   Procedure: Venia Minks DILATION;  Surgeon: Daneil Dolin, MD;  [facility];  Service: Endoscopy;  Laterality: N/A;   PATELLA FRACTURE SURGERY Left    POLYPECTOMY  04/17/2017   Procedure: POLYPECTOMY;  Surgeon: Daneil Dolin, MD;  [facility];  Service: Endoscopy;;   TEE WITHOUT CARDIOVERSION N/A 01/23/2019   Procedure: TRANSESOPHAGEAL ECHOCARDIOGRAM (TEE);  Surgeon: Jerline Pain, MD;  Location: [facility];  Service: Cardiovascular;  Laterality: N/A;   [place] N/A 01/19/2018   Procedure: UPPER EXTREMITY ANGIOGRAPHY;  Surgeon: Elam Dutch, MD;  Location: [facility];  Service: Cardiovascular;  Laterality: N/A;    MEDICATIONS: Current Outpatient Medications on File Prior to Visit  Medication Sig Dispense Refill   amLODipine (NORVASC) 5 MG tablet Take 1 tablet (5 mg total) by mouth daily. (Patient taking differently: Take 5 mg by mouth at bedtime.) 30 tablet 0   aspirin EC 81 MG tablet Take 1 tablet (81 mg total) by mouth daily. (Patient taking differently: Take 81 mg by mouth at bedtime.)     atorvastatin (LIPITOR) 80 MG tablet Take 1 tablet (80 mg total) by mouth daily at 6 PM. 30 tablet 0   Buprenorphine HCl-Naloxone HCl 8-2 MG FILM Take 1 Film by mouth daily. 1/2 film under the tongue     clopidogrel (PLAVIX) 75 MG tablet Take 1 tablet (75 mg total) by  mouth daily. 30 tablet 3   colchicine 0.6 MG tablet Take 1 tablet (0.6 mg total) by mouth 2 (two) times daily for 7 days. 14 tablet 0   DEXILANT 60 MG capsule TAKE 1 CAPSULE(60 MG) BY MOUTH AT BEDTIME 90 capsule 3   Oxycodone HCl 10 MG TABS Take 1 tablet (10 mg total) by mouth every 6 (six) hours as needed. 10 tablet 0   potassium chloride SA (K-DUR,KLOR-CON) 20 MEQ tablet Take 20 mEq by mouth at bedtime.      saccharomyces boulardii (FLORASTOR) 250 MG capsule Take 1 capsule (250 mg total) by mouth 2 (two) times daily.     No current facility-administered medications on file prior to visit.    ALLERGIES: No Known Allergies  FAMILY HISTORY: Family History  Problem Relation Age of Onset   Asthma Mother    Heart failure Mother    Cancer Mother        pancreatic   Diabetes Mother    Hypertension Mother  Stroke Mother    Pancreatic cancer Mother        deceased   Heart failure Father    Diabetes Father    Colon cancer Neg Hx     Objective:  Blood pressure (!) 151/76, pulse 80, resp. rate 18, height '5\' 7"'$  (1.702 m), weight 204 lb (92.5 kg), last menstrual period 02/09/2012, SpO2 97 %. General: No acute distress.  Patient appears well-groomed.   Head:  Normocephalic/atraumatic Eyes:  fundi examined but not visualized Neck: supple, no paraspinal tenderness, full range of motion Back: No paraspinal tenderness Heart: regular rate and rhythm Lungs: Clear to auscultation bilaterally. Vascular: No carotid bruits. Neurological Exam: Mental status: alert and oriented to person, place, and time, recent and remote memory intact, fund of knowledge intact, attention and concentration intact, speech fluent and not dysarthric, language intact. Cranial nerves: CN I: not tested CN II: pupils equal, round and reactive to light, visual fields intact CN III, IV, VI:  full range of motion, no nystagmus, no ptosis CN V: facial sensation intact. CN VII: upper and lower face symmetric CN VIII:  hearing intact CN IX, X: gag intact, uvula midline CN XI: sternocleidomastoid and trapezius muscles intact CN XII: tongue midline Bulk & Tone: normal, no fasciculations. Motor:  muscle strength 5/5 throughout Sensation:  Pinprick sensation reduced on right upper and lower extremities.  Vibratory sensation intact. Deep Tendon Reflexes:  2+ throughout,  toes downgoing.   Finger to nose testing:  Without dysmetria.   Heel to shin:  Without dysmetria.   Gait:  Normal station and stride.  Romberg negative.    Thank you for allowing me to take part in the care of this patient.  Metta Clines, DO

## 2021-08-20 ENCOUNTER — Ambulatory Visit (INDEPENDENT_AMBULATORY_CARE_PROVIDER_SITE_OTHER): Payer: Medicare Other | Admitting: Neurology

## 2021-08-20 ENCOUNTER — Other Ambulatory Visit: Payer: Self-pay

## 2021-08-20 ENCOUNTER — Encounter: Payer: Self-pay | Admitting: Neurology

## 2021-08-20 VITALS — BP 151/76 | HR 80 | Resp 18 | Ht 67.0 in | Wt 204.0 lb

## 2021-08-20 DIAGNOSIS — I10 Essential (primary) hypertension: Secondary | ICD-10-CM | POA: Diagnosis not present

## 2021-08-20 DIAGNOSIS — E785 Hyperlipidemia, unspecified: Secondary | ICD-10-CM | POA: Diagnosis not present

## 2021-08-20 DIAGNOSIS — R002 Palpitations: Secondary | ICD-10-CM

## 2021-08-20 DIAGNOSIS — G459 Transient cerebral ischemic attack, unspecified: Secondary | ICD-10-CM | POA: Diagnosis not present

## 2021-08-20 DIAGNOSIS — I6523 Occlusion and stenosis of bilateral carotid arteries: Secondary | ICD-10-CM | POA: Diagnosis not present

## 2021-08-20 DIAGNOSIS — F17200 Nicotine dependence, unspecified, uncomplicated: Secondary | ICD-10-CM

## 2021-08-20 DIAGNOSIS — I6522 Occlusion and stenosis of left carotid artery: Secondary | ICD-10-CM

## 2021-08-20 NOTE — Patient Instructions (Addendum)
Continue aspirin and Plavix daily for 90 days.  After 90 days, you may discontinue Plavix and continue aspirin '81mg'$  daily alone. Continue atorvastatin '80mg'$  daily Blood pressure control Mediterranean diet (see below) Try to quit smoking CTA head and neck Refer to cardiology for palpitations Follow up 6 months  Mediterranean Diet A Mediterranean diet refers to food and lifestyle choices that are based on the traditions of countries located on the [employer]. This way of eating has been shown to help prevent certain conditions and improve outcomes forpeople who have chronic diseases, like kidney disease and heart disease. What are tips for following this plan? Lifestyle Cook and eat meals together with your family, when possible. Drink enough fluid to keep your urine clear or pale yellow. Be physically active every day. This includes: Aerobic exercise like running or swimming. Leisure activities like gardening, walking, or housework. Get 7-8 hours of sleep each night. If recommended by your health care provider, drink red wine in moderation. This means 1 glass a day for nonpregnant women and 2 glasses a day for men. A glass of wine equals 5 oz (150 mL). Reading food labels  Check the serving size of packaged foods. For foods such as rice and pasta, the serving size refers to the amount of cooked product, not dry. Check the total fat in packaged foods. Avoid foods that have saturated fat or trans fats. Check the ingredients list for added sugars, such as corn syrup.  Shopping At the grocery store, buy most of your food from the areas near the walls of the store. This includes: Fresh fruits and vegetables (produce). Grains, beans, nuts, and seeds. Some of these may be available in unpackaged forms or large amounts (in bulk). Fresh seafood. Poultry and eggs. Low-fat dairy products. Buy whole ingredients instead of prepackaged foods. Buy fresh fruits and vegetables in-season from  local farmers markets. Buy frozen fruits and vegetables in resealable bags. If you do not have access to quality fresh seafood, buy precooked frozen shrimp or canned fish, such as tuna, salmon, or sardines. Buy small amounts of raw or cooked vegetables, salads, or olives from the deli or salad bar at your store. Stock your pantry so you always have certain foods on hand, such as olive oil, canned tuna, canned tomatoes, rice, pasta, and beans. Cooking Cook foods with extra-virgin olive oil instead of using butter or other vegetable oils. Have meat as a side dish, and have vegetables or grains as your main dish. This means having meat in small portions or adding small amounts of meat to foods like pasta or stew. Use beans or vegetables instead of meat in common dishes like chili or lasagna. Experiment with different cooking methods. Try roasting or broiling vegetables instead of steaming or sauteing them. Add frozen vegetables to soups, stews, pasta, or rice. Add nuts or seeds for added healthy fat at each meal. You can add these to yogurt, salads, or vegetable dishes. Marinate fish or vegetables using olive oil, lemon juice, garlic, and fresh herbs. Meal planning  Plan to eat 1 vegetarian meal one day each week. Try to work up to 2 vegetarian meals, if possible. Eat seafood 2 or more times a week. Have healthy snacks readily available, such as: Vegetable sticks with hummus. Greek yogurt. Fruit and nut trail mix. Eat balanced meals throughout the week. This includes: Fruit: 2-3 servings a day Vegetables: 4-5 servings a day Low-fat dairy: 2 servings a day Fish, poultry, or lean meat: 1 serving a  day Beans and legumes: 2 or more servings a week Nuts and seeds: 1-2 servings a day Whole grains: 6-8 servings a day Extra-virgin olive oil: 3-4 servings a day Limit red meat and sweets to only a few servings a month  What are my food choices? Mediterranean diet Recommended Grains:  Whole-grain pasta. Brown rice. Bulgar wheat. Polenta. Couscous. Whole-wheat bread. [place]. Vegetables: Artichokes. Beets. Broccoli. Cabbage. Carrots. Eggplant. Green beans. Chard. Kale. Spinach. Onions. Leeks. Peas. Squash. Tomatoes. Peppers. Radishes. Fruits: Apples. Apricots. Avocado. Berries. Bananas. Cherries. Dates. Figs. Grapes. Lemons. Melon. Oranges. Peaches. Plums. Pomegranate. Meats and other protein foods: Beans. Almonds. Sunflower seeds. Pine nuts. Peanuts. [place]. Salmon. Scallops. Shrimp. [place]. Tilapia. Clams. Oysters. Eggs. Dairy: Low-fat milk. Cheese. Greek yogurt. Beverages: Water. Red wine. Herbal tea. Fats and oils: Extra virgin olive oil. Avocado oil. Grape seed oil. Sweets and desserts: [place] yogurt with honey. Baked apples. Poached pears. Trail mix. Seasoning and other foods: Basil. Cilantro. Coriander. Cumin. Mint. Parsley. Sage. Rosemary. Tarragon. Garlic. Oregano. Thyme. Pepper. Balsalmic vinegar. Tahini. Hummus. Tomato sauce. Olives. Mushrooms. Limit these Grains: Prepackaged pasta or rice dishes. Prepackaged cereal with added sugar. Vegetables: Deep fried potatoes (french fries). Fruits: Fruit canned in syrup. Meats and other protein foods: Beef. Pork. Lamb. Poultry with skin. Hot dogs. Berniece Salines. Dairy: Ice cream. Sour cream. Whole milk. Beverages: Juice. Sugar-sweetened soft drinks. Beer. Liquor and spirits. Fats and oils: Butter. Canola oil. Vegetable oil. Beef fat (tallow). Lard. Sweets and desserts: Cookies. Cakes. Pies. Candy. Seasoning and other foods: Mayonnaise. Premade sauces and marinades. The items listed may not be a complete list. Talk with your dietitian aboutwhat dietary choices are right for you. Summary The Mediterranean diet includes both food and lifestyle choices. Eat a variety of fresh fruits and vegetables, beans, nuts, seeds, and whole grains. Limit the amount of red meat and sweets that you eat. Talk with your health care provider about  whether it is safe for you to drink red wine in moderation. This means 1 glass a day for nonpregnant women and 2 glasses a day for men. A glass of wine equals 5 oz (150 mL). This information is not intended to replace advice given to you by your health care provider. Make sure you discuss any questions you have with your healthcare provider. Document Revised: 08/11/2016 Document Reviewed: 08/04/2016 Elsevier Patient Education  [place].   We have sent a referral to [facility] for your CTA  and they will call you directly to schedule your appointment. They are located at [place]. If you need to contact them directly please call [phone].

## 2022-03-01 NOTE — Progress Notes (Unsigned)
Virtual Visit via Video Note The purpose of this virtual visit is to provide medical care while limiting exposure to the novel coronavirus.    Consent was obtained for video visit:  Yes.   Answered questions that patient had about telehealth interaction:  Yes.   I discussed the limitations, risks, security and privacy concerns of performing an evaluation and management service by telemedicine. I also discussed with the patient that there may be a patient responsible charge related to this service. The patient expressed understanding and agreed to proceed.  Pt location: Home Physician Location: office Name of referring provider:  [facility] [place] connected with Rita [name] at patients initiation/request on 03/02/2022 at  3:30 PM EST by video enabled telemedicine application and verified that I am speaking with the correct person using two identifiers. Pt MRN:  [id number] Pt DOB:  1966-03-04 Video Participants:  Rita [name]  Assessment and Plan:   Transient ischemic attack presenting with left facial droop, possible aphasia and worsening right sided weakness in patient with previous history of left hemispheric watershed infarcts.  ED note clearly indicates objective lateralizing weakness.  She still endorses mild weakness but not appreciated on my exam.  Constellation of symptoms does not localize to one area of the brain but she does have multivessel intracranial atherosclerosis and stenosis.  The questionable aphasia and worsening right sided weakness may be due to left MCA or left ICA syndrome.  Left facial weakness may be related to right MCA stenosis.   Intracranial stenosis Moderate left ICA stenosis Hypertension Hyperlipidemia Tobacco use disorder Palpitations.   I agree with vascular surgery to favor maximum medical management rather than intervention of the left ICA stenosis, which is moderate at best.  Medical management of secondary stroke prevention should be followed  by her PCP: This event occurred off antiplatelet therapy.  She was advised to continue both ASA '81mg'$  and Plavix '75mg'$  daily for 3 months (in case the ICA stenosis may have contributed to aphasia and right sided weakness), followed by ASA '81mg'$  daily alone. Atorvastatin '80mg'$  daily.  LDL goal less than 70 Normotensive blood pressure Hgb A1c goal less than 7 Discussed smoking cessation Mediterranean diet I would still like to check CTA of head and neck. Due to palpitations, will refer to cardiology. Follow up 6 months.     Subjective:  Rita [name] is a 56 year old right-handed female with COPD, HTN, high cholesterol, chronic low back and abdominal pain, tobacco use and history of prior CVA who presents for transient ischemic attack.  History supplemented by ED note.  CT and MRI of brain performed in ED personally reviewed.   UPDATE: Current medications:  ***  CTA of head and neck was ordered by never performed.  Due to palpitations, she was referred to cardiology ***  HISTORY: Patient has history of left embolic sided occipital and ACA/MCA watershed infarcts in January 2020 presenting with right sided hemianopsia, mild right sided facial weakness and right sided sensory deficits.  MRA of head and neck at that time demonstrated multivessel intracranial stenosis.  Workup, including TEE, did not reveal definitive cardiac source. Since then, she reported maybe slight residual right upper and lower extremity weakness   She subsequently had stopped a lot of her medications and hadn't been on antiplatelet therapy or statin.   On 08/11/2021, she woke up that morning and noted having left-sided facial droop and confusion.  She was slurring her words and hand trouble understanding what people  were saying to her.  She also didn't always make sense when she spoke.  She also noted worsening right sided upper and lower extremity weakness (last known normal at 10 PM prior night).  She went to the [facility] ED  for stroke evaluation.  She was outside the window for IV tPA.  As per ED provider's exam, she had left sided facial droop, right sided upper and lower extremity weakness and numbness, dysarthria (but no aphasia) and right-sided visual field deficit.  CT head showed remote bilateral cerebellar infarcts and old left occipital lobe infarcts but no acute abnormalities.  MRI of brain showed no acute infarcts.  Repeat exam reportedly improved.  Due to negative VAN score, CTA of head and neck was deferred and she was discharged with outpatient PCP follow up.  She followed up with her vascular surgeon.  Carotid ultrasound on 08/13/2021 showed new 40-59% stenosis in the mid and distal segments of the left ICA.  She followed up with her vascular surgeon.  As her symptoms were subjective and did not exhibit any clear objective focal abnormalities, intervention was not recommended.   Symptoms have greatly improved.  She says she sometimes still may have trouble understanding others, usually in the evenings.  She thinks she has a slight left facial droop and right leg still feels slightly weak.     She also endorses palpitations which have progressively gotten worse.  They now occur daily.     08/13/2021 CAROTID U/S:  Right Carotid: Velocities in the right ICA are consistent with a 1-39%  stenosis. Left Carotid: Velocities in the left ICA are consistent with a 40-59% stenosis in the mid and distal segments.. Patent carotid to subclavian bypass graft.  Vertebrals:  Bilateral vertebral arteries demonstrate antegrade flow. Right verterbral flow is diminished.  Subclavians: Normal flow hemodynamics were seen in bilateral subclavian arteries.  08/11/2021 MRI BRAIN:  No acute intracranial abnormality.  Old infarcts of the left occipital lobe, left corona radiata and the bilateral cerebellar hemispheres 01/21/2019 MRI BRAIN:  Acute/subacute nonhemorrhagic left occipital lobe infarct. This may be a lateral PCA or watershed  infarct.  Additional acute/subacute nonhemorrhagic left ACA/MCA watershed infarcts over the left convexity.  The brainstem and cerebellum are within normal limits.  Subcortical white matter disease is mildly advanced for age. This likely reflects the sequela of chronic microvascular ischemia. 01/21/2019 MRA HEAD & NECK:  Distal left vertebral artery occlusion with slow/reversed flow in the left VA proximal to the dural segment.  Moderate distal right M1 segment stenosis.  Moderate distal left A1 segment stenosis.  Mild irregularity in the basilar artery and proximal PCA vessels bilaterally with asymmetric attenuation of the distal branches, right greater than left.  Past Medical History: Past Medical History:  Diagnosis Date   Arthritis    "qwhere" (02/22/2018)   Barrett's esophagus with esophagitis 03/26/2013   Chronic abdominal pain    Chronic lower back pain    Chronic pancreatitis (HCC)    COPD (chronic obstructive pulmonary disease) (HCC)    Depression    Dyspnea    GERD (gastroesophageal reflux disease)    Heart murmur    Hiatal hernia    High cholesterol    Hypertension    Peptic ulcer    Pneumonia 2000s X 1   Stroke ([place])    Tobacco use 03/26/2013    Medications: Outpatient Encounter Medications as of 03/02/2022  Medication Sig   amLODipine (NORVASC) 5 MG tablet Take 1 tablet (5 mg total) by  mouth daily. (Patient taking differently: Take 5 mg by mouth at bedtime.)   aspirin EC 81 MG tablet Take 1 tablet (81 mg total) by mouth daily. (Patient taking differently: Take 81 mg by mouth at bedtime.)   atorvastatin (LIPITOR) 80 MG tablet Take 1 tablet (80 mg total) by mouth daily at 6 PM.   baclofen (LIORESAL) 10 MG tablet Take 10 mg by mouth 4 (four) times daily as needed.   Buprenorphine HCl-Naloxone HCl 8-2 MG FILM Take 1 Film by mouth daily. 1/2 film under the tongue   buPROPion (WELLBUTRIN SR) 150 MG 12 hr tablet Take 150 mg by mouth daily.   clopidogrel (PLAVIX) 75 MG tablet  Take 1 tablet (75 mg total) by mouth daily.   colchicine 0.6 MG tablet Take 1 tablet (0.6 mg total) by mouth 2 (two) times daily for 7 days.   DEXILANT 60 MG capsule TAKE 1 CAPSULE(60 MG) BY MOUTH AT BEDTIME   Oxycodone HCl 10 MG TABS Take 1 tablet (10 mg total) by mouth every 6 (six) hours as needed.   potassium chloride SA (K-DUR,KLOR-CON) 20 MEQ tablet Take 20 mEq by mouth at bedtime.    saccharomyces boulardii (FLORASTOR) 250 MG capsule Take 1 capsule (250 mg total) by mouth 2 (two) times daily.   No facility-administered encounter medications on file as of 03/02/2022.    Allergies: No Known Allergies  Family History: Family History  Problem Relation Age of Onset   Asthma Mother    Heart failure Mother    Cancer Mother        pancreatic   Diabetes Mother    Hypertension Mother    Stroke Mother    Pancreatic cancer Mother        deceased   Heart failure Father    Diabetes Father    Colon cancer Neg Hx     Observations/Objective:   No acute distress.  Alert and oriented.  Speech fluent and not dysarthric.  Language intact.     Follow Up Instructions:    -I discussed the assessment and treatment plan with the patient. The patient was provided an opportunity to ask questions and all were answered. The patient agreed with the plan and demonstrated an understanding of the instructions.   The patient was advised to call back or seek an in-person evaluation if the symptoms worsen or if the condition fails to improve as anticipated.   Dudley Major, DO

## 2022-03-02 ENCOUNTER — Ambulatory Visit: Payer: Medicare Other | Admitting: Neurology

## 2022-09-01 ENCOUNTER — Emergency Department (HOSPITAL_COMMUNITY): Payer: Medicare Other

## 2022-09-01 ENCOUNTER — Encounter (HOSPITAL_COMMUNITY): Payer: Self-pay | Admitting: Emergency Medicine

## 2022-09-01 ENCOUNTER — Other Ambulatory Visit: Payer: Self-pay

## 2022-09-01 ENCOUNTER — Inpatient Hospital Stay (HOSPITAL_COMMUNITY)
Admission: EM | Admit: 2022-09-01 | Discharge: 2022-09-09 | DRG: 024 | Disposition: A | Discharge status: Home / Self Care | Payer: Medicare Other | Attending: Family Medicine | Admitting: Family Medicine

## 2022-09-01 DIAGNOSIS — Z716 Tobacco abuse counseling: Secondary | ICD-10-CM

## 2022-09-01 DIAGNOSIS — E669 Obesity, unspecified: Secondary | ICD-10-CM | POA: Diagnosis present

## 2022-09-01 DIAGNOSIS — F32A Depression, unspecified: Secondary | ICD-10-CM | POA: Diagnosis present

## 2022-09-01 DIAGNOSIS — M1990 Unspecified osteoarthritis, unspecified site: Secondary | ICD-10-CM | POA: Diagnosis present

## 2022-09-01 DIAGNOSIS — E876 Hypokalemia: Secondary | ICD-10-CM | POA: Diagnosis not present

## 2022-09-01 DIAGNOSIS — E860 Dehydration: Secondary | ICD-10-CM | POA: Diagnosis present

## 2022-09-01 DIAGNOSIS — K219 Gastro-esophageal reflux disease without esophagitis: Secondary | ICD-10-CM | POA: Diagnosis not present

## 2022-09-01 DIAGNOSIS — E782 Mixed hyperlipidemia: Secondary | ICD-10-CM | POA: Diagnosis not present

## 2022-09-01 DIAGNOSIS — M549 Dorsalgia, unspecified: Secondary | ICD-10-CM | POA: Diagnosis present

## 2022-09-01 DIAGNOSIS — R29705 NIHSS score 5: Secondary | ICD-10-CM | POA: Diagnosis present

## 2022-09-01 DIAGNOSIS — Z8249 Family history of ischemic heart disease and other diseases of the circulatory system: Secondary | ICD-10-CM

## 2022-09-01 DIAGNOSIS — M545 Low back pain, unspecified: Secondary | ICD-10-CM | POA: Diagnosis present

## 2022-09-01 DIAGNOSIS — Z8673 Personal history of transient ischemic attack (TIA), and cerebral infarction without residual deficits: Secondary | ICD-10-CM

## 2022-09-01 DIAGNOSIS — R918 Other nonspecific abnormal finding of lung field: Secondary | ICD-10-CM | POA: Diagnosis present

## 2022-09-01 DIAGNOSIS — F17210 Nicotine dependence, cigarettes, uncomplicated: Secondary | ICD-10-CM | POA: Diagnosis present

## 2022-09-01 DIAGNOSIS — Z823 Family history of stroke: Secondary | ICD-10-CM

## 2022-09-01 DIAGNOSIS — R7302 Impaired glucose tolerance (oral): Secondary | ICD-10-CM

## 2022-09-01 DIAGNOSIS — I63233 Cerebral infarction due to unspecified occlusion or stenosis of bilateral carotid arteries: Secondary | ICD-10-CM | POA: Diagnosis not present

## 2022-09-01 DIAGNOSIS — I639 Cerebral infarction, unspecified: Secondary | ICD-10-CM | POA: Diagnosis not present

## 2022-09-01 DIAGNOSIS — I671 Cerebral aneurysm, nonruptured: Secondary | ICD-10-CM

## 2022-09-01 DIAGNOSIS — Z79899 Other long term (current) drug therapy: Secondary | ICD-10-CM

## 2022-09-01 DIAGNOSIS — Z6835 Body mass index (BMI) 35.0-35.9, adult: Secondary | ICD-10-CM

## 2022-09-01 DIAGNOSIS — Z91199 Patient's noncompliance with other medical treatment and regimen due to unspecified reason: Secondary | ICD-10-CM

## 2022-09-01 DIAGNOSIS — Z79891 Long term (current) use of opiate analgesic: Secondary | ICD-10-CM

## 2022-09-01 DIAGNOSIS — F1120 Opioid dependence, uncomplicated: Secondary | ICD-10-CM

## 2022-09-01 DIAGNOSIS — Z7902 Long term (current) use of antithrombotics/antiplatelets: Secondary | ICD-10-CM

## 2022-09-01 DIAGNOSIS — N179 Acute kidney failure, unspecified: Secondary | ICD-10-CM | POA: Diagnosis present

## 2022-09-01 DIAGNOSIS — I6522 Occlusion and stenosis of left carotid artery: Secondary | ICD-10-CM | POA: Diagnosis present

## 2022-09-01 DIAGNOSIS — E66812 Obesity, class 2: Secondary | ICD-10-CM

## 2022-09-01 DIAGNOSIS — R4701 Aphasia: Secondary | ICD-10-CM | POA: Diagnosis present

## 2022-09-01 DIAGNOSIS — J449 Chronic obstructive pulmonary disease, unspecified: Secondary | ICD-10-CM | POA: Diagnosis present

## 2022-09-01 DIAGNOSIS — R001 Bradycardia, unspecified: Secondary | ICD-10-CM | POA: Diagnosis present

## 2022-09-01 DIAGNOSIS — Z006 Encounter for examination for normal comparison and control in clinical research program: Secondary | ICD-10-CM

## 2022-09-01 DIAGNOSIS — Z833 Family history of diabetes mellitus: Secondary | ICD-10-CM

## 2022-09-01 DIAGNOSIS — K2270 Barrett's esophagus without dysplasia: Secondary | ICD-10-CM | POA: Diagnosis present

## 2022-09-01 DIAGNOSIS — I708 Atherosclerosis of other arteries: Secondary | ICD-10-CM | POA: Diagnosis present

## 2022-09-01 DIAGNOSIS — G8929 Other chronic pain: Secondary | ICD-10-CM | POA: Diagnosis present

## 2022-09-01 DIAGNOSIS — Z720 Tobacco use: Secondary | ICD-10-CM

## 2022-09-01 DIAGNOSIS — Z8711 Personal history of peptic ulcer disease: Secondary | ICD-10-CM

## 2022-09-01 DIAGNOSIS — G8321 Monoplegia of upper limb affecting right dominant side: Secondary | ICD-10-CM | POA: Diagnosis present

## 2022-09-01 DIAGNOSIS — I720 Aneurysm of carotid artery: Secondary | ICD-10-CM | POA: Diagnosis present

## 2022-09-01 DIAGNOSIS — Z9049 Acquired absence of other specified parts of digestive tract: Secondary | ICD-10-CM

## 2022-09-01 DIAGNOSIS — R739 Hyperglycemia, unspecified: Secondary | ICD-10-CM | POA: Diagnosis present

## 2022-09-01 DIAGNOSIS — I10 Essential (primary) hypertension: Secondary | ICD-10-CM | POA: Diagnosis present

## 2022-09-01 DIAGNOSIS — K861 Other chronic pancreatitis: Secondary | ICD-10-CM | POA: Diagnosis present

## 2022-09-01 DIAGNOSIS — Z800 Family history of malignant neoplasm of digestive organs: Secondary | ICD-10-CM

## 2022-09-01 LAB — COMPREHENSIVE METABOLIC PANEL
ALT: 25 U/L (ref 0–44)
AST: 20 U/L (ref 15–41)
Albumin: 3.9 g/dL (ref 3.5–5.0)
Alkaline Phosphatase: 78 U/L (ref 38–126)
Anion gap: 11 (ref 5–15)
BUN: 9 mg/dL (ref 6–20)
CO2: 24 mmol/L (ref 22–32)
Calcium: 9.1 mg/dL (ref 8.9–10.3)
Chloride: 103 mmol/L (ref 98–111)
Creatinine, Ser: 0.91 mg/dL (ref 0.44–1.00)
GFR, Estimated: >60 mL/min (ref >60)
Glucose, Bld: 139 mg/dL — ABNORMAL HIGH (ref 70–99)
Potassium: 2.9 mmol/L — ABNORMAL LOW (ref 3.5–5.1)
Sodium: 138 mmol/L (ref 135–145)
Total Bilirubin: 0.6 mg/dL (ref 0.3–1.2)
Total Protein: 7.3 g/dL (ref 6.5–8.1)

## 2022-09-01 LAB — I-STAT CHEM 8, ED
BUN: 10 mg/dL (ref 6–20)
Calcium, Ion: 1.03 mmol/L — ABNORMAL LOW (ref 1.15–1.40)
Chloride: 102 mmol/L (ref 98–111)
Creatinine, Ser: 0.8 mg/dL (ref 0.44–1.00)
Glucose, Bld: 142 mg/dL — ABNORMAL HIGH (ref 70–99)
HCT: 41 % (ref 36.0–46.0)
Hemoglobin: 13.9 g/dL (ref 12.0–15.0)
Potassium: 3.4 mmol/L — ABNORMAL LOW (ref 3.5–5.1)
Sodium: 139 mmol/L (ref 135–145)
TCO2: 26 mmol/L (ref 22–32)

## 2022-09-01 LAB — CBC WITH DIFFERENTIAL/PLATELET
Basophils Absolute: 0.1 10*3/uL (ref 0.0–0.1)
Basophils Relative: 1 %
Eosinophils Absolute: 0.2 10*3/uL (ref 0.0–0.5)
Eosinophils Relative: 2 %
HCT: 41.9 % (ref 36.0–46.0)
Hemoglobin: 13.9 g/dL (ref 12.0–15.0)
Lymphocytes Relative: 28 %
Lymphs Abs: 2.9 10*3/uL (ref 0.7–4.0)
MCH: 29 pg (ref 26.0–34.0)
MCHC: 33.2 g/dL (ref 30.0–36.0)
MCV: 87.5 fL (ref 80.0–100.0)
Monocytes Absolute: 0.5 10*3/uL (ref 0.1–1.0)
Monocytes Relative: 5 %
Neutro Abs: 6.7 10*3/uL (ref 1.7–7.7)
Neutrophils Relative %: 64 %
Platelets: 350 10*3/uL (ref 150–400)
RBC: 4.79 MIL/uL (ref 3.87–5.11)
RDW: 14.3 % (ref 11.5–15.5)
WBC: 10.5 10*3/uL (ref 4.0–10.5)

## 2022-09-01 LAB — CBC
HCT: 40 % (ref 36.0–46.0)
Hemoglobin: 13.7 g/dL (ref 12.0–15.0)
MCH: 29 pg (ref 26.0–34.0)
MCHC: 34.3 g/dL (ref 30.0–36.0)
MCV: 84.7 fL (ref 80.0–100.0)
Platelets: 374 10*3/uL (ref 150–400)
RBC: 4.72 MIL/uL (ref 3.87–5.11)
RDW: 14.4 % (ref 11.5–15.5)
WBC: 9.6 10*3/uL (ref 4.0–10.5)
nRBC: 0 % (ref 0.0–0.2)

## 2022-09-01 LAB — LIPID PANEL
Cholesterol: 264 mg/dL — ABNORMAL HIGH (ref 0–200)
HDL: 38 mg/dL — ABNORMAL LOW (ref >40)
LDL Cholesterol: 167 mg/dL — ABNORMAL HIGH (ref 0–99)
Total CHOL/HDL Ratio: 6.9 RATIO
Triglycerides: 293 mg/dL — ABNORMAL HIGH (ref <150)
VLDL: 59 mg/dL — ABNORMAL HIGH (ref 0–40)

## 2022-09-01 LAB — CREATININE, SERUM
Creatinine, Ser: 0.8 mg/dL (ref 0.44–1.00)
GFR, Estimated: >60 mL/min (ref >60)

## 2022-09-01 LAB — ETHANOL: Alcohol, Ethyl (B): <10 mg/dL (ref <10)

## 2022-09-01 LAB — PROTIME-INR
INR: 0.9 (ref 0.8–1.2)
INR: 0.9 (ref 0.8–1.2)
Prothrombin Time: 12 seconds (ref 11.4–15.2)
Prothrombin Time: 12.3 seconds (ref 11.4–15.2)

## 2022-09-01 LAB — APTT: aPTT: 28 seconds (ref 24–36)

## 2022-09-01 LAB — GLUCOSE, CAPILLARY: Glucose-Capillary: 154 mg/dL — ABNORMAL HIGH (ref 70–99)

## 2022-09-01 MED ORDER — ENOXAPARIN SODIUM 60 MG/0.6ML IJ SOSY
50.0000 mg | PREFILLED_SYRINGE | INTRAMUSCULAR | Status: DC
  Administered 2022-09-01 – 2022-09-07 (×6): 50 mg via SUBCUTANEOUS
  Filled 2022-09-01 (×7): qty 0.6

## 2022-09-01 MED ORDER — PANTOPRAZOLE SODIUM 40 MG PO TBEC
40.0000 mg | DELAYED_RELEASE_TABLET | Freq: Every day | ORAL | Status: DC
  Administered 2022-09-02 – 2022-09-04 (×3): 40 mg via ORAL
  Filled 2022-09-01 (×3): qty 1

## 2022-09-01 MED ORDER — SODIUM CHLORIDE 0.9% FLUSH: 3.0000 mL | Freq: Once | INTRAVENOUS | Status: DC

## 2022-09-01 MED ORDER — POTASSIUM CHLORIDE CRYS ER 20 MEQ PO TBCR
40.0000 meq | EXTENDED_RELEASE_TABLET | Freq: Once | ORAL | Status: AC
Start: 2022-09-01 — End: 2022-09-01
  Administered 2022-09-01: 40 meq via ORAL
  Filled 2022-09-01: qty 2

## 2022-09-01 MED ORDER — POTASSIUM CHLORIDE CRYS ER 20 MEQ PO TBCR
20.0000 meq | EXTENDED_RELEASE_TABLET | Freq: Once | ORAL | Status: AC
  Administered 2022-09-01: 20 meq via ORAL
  Filled 2022-09-01: qty 1

## 2022-09-01 MED ORDER — GADOBUTROL 1 MMOL/ML IV SOLN
10.0000 mL | Freq: Once | INTRAVENOUS | Status: AC | PRN
Start: 2022-09-01 — End: 2022-09-01
  Administered 2022-09-01: 10 mL via INTRAVENOUS

## 2022-09-01 MED ORDER — ATORVASTATIN CALCIUM 80 MG PO TABS
80.0000 mg | ORAL_TABLET | Freq: Every day | ORAL | Status: DC
  Administered 2022-09-01 – 2022-09-08 (×8): 80 mg via ORAL
  Filled 2022-09-01 (×2): qty 1
  Filled 2022-09-01 (×3): qty 2
  Filled 2022-09-01 (×4): qty 1

## 2022-09-01 MED ORDER — ACETAMINOPHEN 325 MG PO TABS
650.0000 mg | ORAL_TABLET | Freq: Four times a day (QID) | ORAL | Status: DC | PRN
  Administered 2022-09-01 – 2022-09-08 (×8): 650 mg via ORAL
  Filled 2022-09-01 (×8): qty 2

## 2022-09-01 MED ORDER — CLOPIDOGREL BISULFATE 75 MG PO TABS
75.0000 mg | ORAL_TABLET | Freq: Every day | ORAL | Status: DC
  Administered 2022-09-02: 75 mg via ORAL
  Filled 2022-09-01: qty 1

## 2022-09-01 NOTE — ED Provider Notes (Signed)
Signout received on this 56 year old female who presented with concern for right-sided weakness.  Positive pronator drift.  History of CVA.  Patient at the time of signout is awaiting call to hospitalist for admission.  Discussed with neurology who recommends admission for further work-up. Physical Exam  BP 120/77   Pulse (!) 58   Temp 98.8 F (37.1 C) (Oral)   Resp 12   Ht '5\' 7"'$  (1.702 m)   Wt 101.5 kg   LMP 02/09/2012   SpO2 94%   BMI 35.05 kg/m     Procedures  Procedures  ED Course / MDM   Clinical Course as of 09/01/22 2017  Thu Sep 01, 2022  1849 Comprehensive metabolic panel(!) Hypokalemia but otherwise normal. [CF]  1850 I-stat chem 8, ED(!) Hypokalemia. [CF]  1850 Ethanol Negative. [CF]  1850 Protime-INR Normal [CF]  1850 Glucose, capillary(!) Normal. [CF]  9678 MR Brain W and Wo Contrast Personally ordered MRI brain with and without contrast which shows acute stroke.  I do agree with the radiologist interpretation. [CF]  1851 MR ANGIO HEAD WO CONTRAST I personally ordered an MRA head with and without contrast which shows acute stroke.  I do agree with the radiologist interpretation. [CF]  1851 CT HEAD WO CONTRAST No evidence of acute intracranial hemorrhage. [CF]  1903 I spoke with Dr. Rory Percy with neurology who recommends admission and should contact Dr. Hortense Ramal in the morning for further evaluation. She can stay at [facility]  [CF]  [place] with Differential/Platelet [AA]    Clinical Course User Index [AA] Evlyn Courier, PA-C [CF] Hendricks Limes, PA-C   Medical Decision Making Amount and/or Complexity of Data Reviewed Labs: ordered. Decision-making details documented in ED Course. Radiology: ordered. Decision-making details documented in ED Course.  Risk Prescription drug management. Decision regarding hospitalization.   Discussed with hospitalist who will evaluate patient for admission.       Evlyn Courier, PA-C 09/01/22 2018    Elgie Congo, MD 09/02/22 [phone]

## 2022-09-01 NOTE — ED Notes (Signed)
Patient off unit to CT/MRI at this time.

## 2022-09-01 NOTE — H&P (Signed)
History and Physical    Patient: Rita [name] BWG:[id number] DOB: [birth year] DOA: 09/01/2022 DOS: the patient was seen and examined on 09/01/2022 PCP: [facility] [place]  Patient coming from: Home  Chief Complaint:  Chief Complaint  Patient presents with   Aphasia   HPI: Rita [name] is a 56 y.o. female with medical history significant of hyperlipidemia, Plavix, history of left occipital CVA without residual deficits (2020), tobacco abuse who presented to the emergency department due to right-sided weakness and slurred speech which started yesterday.  She states that she was in her normal state of health when she went to bed on Tuesday evening and on walking up yesterday (Wednesday) morning, she noted right-sided weakness which was worse in right arm.  Symptoms worsened as the day progressed with worsening slurred speech, weakness and numbness.  She states that she has been compliant with her statin and Plavix, though she endorsed missing some doses occasionally.  Patient continues to smoke 3 to 4 cigarettes daily.  She denies headache, chest pain, shortness of breath, nausea, vomiting, abdominal pain   ED Course:  In the emergency department, she was intermittently bradycardic, BP was 119/80 and other vital signs were within normal range.  Work-up in the ED shows hypokalemia, hyperglycemia, alcohol level was less than 10. CT head without contrast showed no acute intracranial abnormality MRI head without and with contrast showed: 1. Multiple acute infarcts in the high left frontal and parietal lobes, potentially watershed. 2. Motion limited MRA without definite evidence of emergent proximal large vessel occlusion. 3. Particularly limited evaluation of the PCAs with nondiagnostic evaluation distal to the mid P2 PCAs bilaterally. A CTA could further characterize if clinically warranted. Potassium was replenished.  Neurologist at [facility] was consulted and recommended the patient can stay at [facility] and  to consult with [facility] neurology in the morning per ED PA.  Review of Systems: Review of systems as noted in the HPI. All other systems reviewed and are negative.   Past Medical History:  Diagnosis Date   Arthritis    "qwhere" (02/22/2018)   Barrett's esophagus with esophagitis 03/26/2013   Chronic abdominal pain    Chronic lower back pain    Chronic pancreatitis (HCC)    COPD (chronic obstructive pulmonary disease) (HCC)    Depression    Dyspnea    GERD (gastroesophageal reflux disease)    Heart murmur    Hiatal hernia    High cholesterol    Hypertension    Peptic ulcer    Pneumonia 2000s X 1   Stroke ([place])    Tobacco use 03/26/2013   Past Surgical History:  Procedure Laterality Date   AORTIC ARCH ANGIOGRAPHY N/A 01/19/2018   Procedure: AORTIC ARCH ANGIOGRAPHY;  Surgeon: Elam Dutch, MD;  Location: [facility] [facility];  Service: Cardiovascular;  Laterality: N/A;   BIOPSY  04/17/2017   Procedure: BIOPSY;  Surgeon: Daneil Dolin, MD;  Location[facility];  Service: Endoscopy;;  esophageal   CARDIAC CATHETERIZATION     CAROTID-SUBCLAVIAN BYPASS GRAFT Left 03/19/2018   Procedure: BYPASS GRAFT CAROTID-SUBCLAVIAN;  Surgeon: Elam Dutch, MD;  Location: [facility];  Service: Vascular;  Laterality: Left;   [place]; 1988   COLONOSCOPY WITH PROPOFOL N/A 04/17/2017   Procedure: COLONOSCOPY WITH PROPOFOL;  Surgeon: Daneil Dolin, MD;  Location: [facility];  Service: Endoscopy;  Laterality: N/A;  100   ESOPHAGOGASTRODUODENOSCOPY  Oct 2011   Dr. Laural Golden: ulcer in distal esophagus, soft  stricture at GE junction s/p balloon dilation PATH: BARRETT'S   ESOPHAGOGASTRODUODENOSCOPY (EGD) WITH ESOPHAGEAL DILATION N/A 03/27/2013   Procedure: ESOPHAGOGASTRODUODENOSCOPY (EGD) WITH ESOPHAGEAL DILATION;  Surgeon: Daneil Dolin, MD;  [facility];  Service: Endoscopy;  Laterality: N/A;  possible dilation   ESOPHAGOGASTRODUODENOSCOPY (EGD) WITH PROPOFOL N/A 01/02/2017    Procedure: ESOPHAGOGASTRODUODENOSCOPY (EGD) WITH PROPOFOL;  Surgeon: Daneil Dolin, MD;  [facility];  Service: Endoscopy;  Laterality: N/A;  with possible esophageal dilation   ESOPHAGOGASTRODUODENOSCOPY (EGD) WITH PROPOFOL N/A 04/17/2017   Procedure: ESOPHAGOGASTRODUODENOSCOPY (EGD) WITH PROPOFOL;  Surgeon: Daneil Dolin, MD;  [facility];  Service: Endoscopy;  Laterality: N/A;   EUS N/A 05/09/2013   Procedure: UPPER ENDOSCOPIC ULTRASOUND (EUS) LINEAR;  Surgeon: Milus Banister, MD;  Location: [facility];  Service: Endoscopy;  Laterality: N/A;   FRACTURE SURGERY     pt. denies   KNEE ARTHROSCOPY Right    LAPAROSCOPIC CHOLECYSTECTOMY     LEFT HEART CATH AND CORONARY ANGIOGRAPHY N/A 02/26/2018   Procedure: LEFT HEART CATH AND CORONARY ANGIOGRAPHY;  Surgeon: Burnell Blanks, MD;  Location: [facility];  Service: Cardiovascular;  Laterality: N/A;   MALONEY DILATION N/A 04/17/2017   Procedure: Venia Minks DILATION;  Surgeon: Daneil Dolin, MD;  [facility];  Service: Endoscopy;  Laterality: N/A;   PATELLA FRACTURE SURGERY Left    POLYPECTOMY  04/17/2017   Procedure: POLYPECTOMY;  Surgeon: Daneil Dolin, MD;  [facility];  Service: Endoscopy;;   TEE WITHOUT CARDIOVERSION N/A 01/23/2019   Procedure: TRANSESOPHAGEAL ECHOCARDIOGRAM (TEE);  Surgeon: Jerline Pain, MD;  Location: [facility];  Service: Cardiovascular;  Laterality: N/A;   [place] N/A 01/19/2018   Procedure: UPPER EXTREMITY ANGIOGRAPHY;  Surgeon: Elam Dutch, MD;  Location: [facility];  Service: Cardiovascular;  Laterality: N/A;    Social History:  reports that she has been smoking cigarettes. She started smoking about 39 years ago. She has a 20.50 pack-year smoking history. She has never used smokeless tobacco. She reports that she does not drink alcohol and does not use drugs.   No Known Allergies  Family History   Problem Relation Age of Onset   Asthma Mother    Heart failure Mother    Cancer Mother        pancreatic   Diabetes Mother    Hypertension Mother    Stroke Mother    Pancreatic cancer Mother        deceased   Heart failure Father    Diabetes Father    Colon cancer Neg Hx      Prior to Admission medications   Medication Sig Start Date End Date Taking? Authorizing Provider  atorvastatin (LIPITOR) 80 MG tablet Take 1 tablet (80 mg total) by mouth daily at 6 PM. 01/24/19  Yes Danford, Suann Larry, MD  Buprenorphine HCl-Naloxone HCl 8-2 MG FILM Take 1 Film by mouth 3 (three) times daily. 12/14/18  Yes [provider]  clopidogrel (PLAVIX) 75 MG tablet Take 1 tablet (75 mg total) by mouth daily. 01/24/19  Yes Danford, Suann Larry, MD  DEXILANT 60 MG capsule TAKE 1 CAPSULE(60 MG) BY MOUTH AT BEDTIME Patient taking differently: Take 60 mg by mouth daily. 02/19/19  Yes Annitta Needs, NP  potassium chloride (MICRO-K) 10 MEQ CR capsule Take 10 mEq by mouth 2 (two) times daily. 05/26/22  Yes [provider]  colchicine  0.6 MG tablet Take 1 tablet (0.6 mg total) by mouth 2 (two) times daily for 7 days. Patient not taking: Reported on 09/01/2022 09/20/19 09/27/19  Janeece Fitting, PA-C    Physical Exam: BP 120/77   Pulse (!) 58   Temp 98.8 F (37.1 C) (Oral)   Resp 12   Ht '5\' 7"'$  (1.702 m)   Wt 101.5 kg   LMP 02/09/2012   SpO2 94%   BMI 35.05 kg/m   General: 56 y.o. year-old female well developed well nourished in no acute distress.  Alert and oriented x3. HEENT: NCAT, EOMI Neck: Supple, trachea medial Cardiovascular: Bradycardia.  Regular rate and rhythm with no rubs or gallops.  No thyromegaly or JVD noted.  No lower extremity edema. 2/4 pulses in all 4 extremities. Respiratory: Clear to auscultation with no wheezes or rales. Good inspiratory effort. Abdomen: Soft, nontender nondistended with normal bowel sounds x4 quadrants. Muskuloskeletal: No cyanosis, clubbing or  edema noted bilaterally Neuro: CN II-XII intact, strength left-sided 5/5; right-sided  4/5, normal heel-to-shin movements Skin: No ulcerative lesions noted or rashes Psychiatry: Mood is appropriate for condition and setting          Labs on Admission:  Basic Metabolic Panel: Recent Labs  Lab 09/01/22 1550 09/01/22 1605  NA 139 138  K 3.4* 2.9*  CL 102 103  CO2  --  24  GLUCOSE 142* 139*  BUN 10 9  CREATININE 0.80 0.91  CALCIUM  --  9.1   Liver Function Tests: Recent Labs  Lab 09/01/22 1605  AST 20  ALT 25  ALKPHOS 78  BILITOT 0.6  PROT 7.3  ALBUMIN 3.9   No results for input(s): "LIPASE", "AMYLASE" in the last 168 hours. No results for input(s): "AMMONIA" in the last 168 hours. CBC: Recent Labs  Lab 09/01/22 1550 09/01/22 2015  WBC  --  9.6  HGB 13.9 13.7  HCT 41.0 40.0  MCV  --  84.7  PLT  --  374   Cardiac Enzymes: No results for input(s): "CKTOTAL", "CKMB", "CKMBINDEX", "TROPONINI" in the last 168 hours.  BNP (last 3 results) No results for input(s): "BNP" in the last 8760 hours.  ProBNP (last 3 results) No results for input(s): "PROBNP" in the last 8760 hours.  CBG: Recent Labs  Lab 09/01/22 1526  GLUCAP 154*    Radiological Exams on Admission: MR Brain W and Wo Contrast  Result Date: 09/01/2022 CLINICAL DATA:  Neuro deficit, acute, stroke suspected EXAM: MRI HEAD WITHOUT AND WITH CONTRAST MRA HEAD WITHOUT CONTRAST TECHNIQUE: Multiplanar, multiecho pulse sequences of the brain and surrounding structures were obtained without and with intravenous contrast. Angiographic images of the Circle of Willis were obtained using MRA technique without intravenous contrast. CONTRAST:  24m GADAVIST GADOBUTROL 1 MMOL/ML IV SOLN COMPARISON:  MRI head 08/11/2021.  MRA 01/21/2019. FINDINGS: MRI HEAD FINDINGS Brain: Multiple acute infarcts in the high left frontal and parietal lobes, potentially watershed and/or left MCA territory. Many other remote infarcts,  including in the corona radiata and cerebellum bilaterally. No evidence of acute hemorrhage, mass lesion, midline shift or hydrocephalus. Mild enhancement in the region of the infarct, probably enhance infarcts. Otherwise, no pathologic enhancement. Vascular: Detailed below. Skull and upper cervical spine: Normal marrow signal. Sinuses/Orbits: Mild paranasal sinus mucosal thickening. No acute orbital findings. Other: No mastoid effusions. MRA HEAD FINDINGS Motion limited study. Anterior circulation: Limited assessment due to motion with bilateral intracranial ICAs, MCAs and ACAs grossly patent without visible proximal emergent large vessel occlusion.  Posterior circulation: Limited study due to patient motion with the visualized basilar artery appearing patent. Only the distal most intradural vertebral arteries are imaged, which appear grossly patent. Poor assessment of the posterior cerebral arteries bilaterally with flow related signal in the P1 and proximal P2 segments and nondiagnostic evaluation more distally. Chronically small vertebrobasilar system. IMPRESSION: 1. Multiple acute infarcts in the high left frontal and parietal lobes, potentially watershed. 2. Motion limited MRA without definite evidence of emergent proximal large vessel occlusion. 3. Particularly limited evaluation of the PCAs with nondiagnostic evaluation distal to the mid P2 PCAs bilaterally. A CTA could further characterize if clinically warranted. Electronically Signed   By: Margaretha Sheffield M.D.   On: 09/01/2022 18:44   MR ANGIO HEAD WO CONTRAST  Result Date: 09/01/2022 CLINICAL DATA:  Neuro deficit, acute, stroke suspected EXAM: MRI HEAD WITHOUT AND WITH CONTRAST MRA HEAD WITHOUT CONTRAST TECHNIQUE: Multiplanar, multiecho pulse sequences of the brain and surrounding structures were obtained without and with intravenous contrast. Angiographic images of the Circle of Willis were obtained using MRA technique without intravenous contrast.  CONTRAST:  24m GADAVIST GADOBUTROL 1 MMOL/ML IV SOLN COMPARISON:  MRI head 08/11/2021.  MRA 01/21/2019. FINDINGS: MRI HEAD FINDINGS Brain: Multiple acute infarcts in the high left frontal and parietal lobes, potentially watershed and/or left MCA territory. Many other remote infarcts, including in the corona radiata and cerebellum bilaterally. No evidence of acute hemorrhage, mass lesion, midline shift or hydrocephalus. Mild enhancement in the region of the infarct, probably enhance infarcts. Otherwise, no pathologic enhancement. Vascular: Detailed below. Skull and upper cervical spine: Normal marrow signal. Sinuses/Orbits: Mild paranasal sinus mucosal thickening. No acute orbital findings. Other: No mastoid effusions. MRA HEAD FINDINGS Motion limited study. Anterior circulation: Limited assessment due to motion with bilateral intracranial ICAs, MCAs and ACAs grossly patent without visible proximal emergent large vessel occlusion. Posterior circulation: Limited study due to patient motion with the visualized basilar artery appearing patent. Only the distal most intradural vertebral arteries are imaged, which appear grossly patent. Poor assessment of the posterior cerebral arteries bilaterally with flow related signal in the P1 and proximal P2 segments and nondiagnostic evaluation more distally. Chronically small vertebrobasilar system. IMPRESSION: 1. Multiple acute infarcts in the high left frontal and parietal lobes, potentially watershed. 2. Motion limited MRA without definite evidence of emergent proximal large vessel occlusion. 3. Particularly limited evaluation of the PCAs with nondiagnostic evaluation distal to the mid P2 PCAs bilaterally. A CTA could further characterize if clinically warranted. Electronically Signed   By: FMargaretha SheffieldM.D.   On: 09/01/2022 18:44   CT HEAD WO CONTRAST  Result Date: 09/01/2022 CLINICAL DATA:  Headache, slurred speech, right-sided weakness. Symptoms began yesterday.  EXAM: CT HEAD WITHOUT CONTRAST TECHNIQUE: Contiguous axial images were obtained from the base of the skull through the vertex without intravenous contrast. RADIATION DOSE REDUCTION: This exam was performed according to the departmental dose-optimization program which includes automated exposure control, adjustment of the mA and/or kV according to patient size and/or use of iterative reconstruction technique. COMPARISON:  CT head 08/11/2021 FINDINGS: Brain: No evidence of acute infarction, hemorrhage, hydrocephalus, extra-axial collection or mass lesion/mass effect. Chronic infarcts noted in the bilateral cerebellum, left occipital lobe, and left corona radiata. Vascular: No hyperdense vessel or unexpected calcification. Skull: Normal. Negative for fracture or focal lesion. Sinuses/Orbits: No acute finding. Trace mucosal thickening in the maxillary sinuses, left sphenoid sinus, and left frontal sinus as well as mild-to-moderate mucosal thickening in scattered bilateral anterior ethmoid air cells.  Mastoid air cells are clear. Other: None. IMPRESSION: No acute intracranial abnormality. Chronic infarcts in the bilateral cerebellum, left occipital lobe, and left corona radiata. Electronically Signed   By: Ileana Roup M.D.   On: 09/01/2022 17:58    EKG: I independently viewed the EKG done and my findings are as followed: Normal sinus rhythm at a rate of 61 bpm with RBBB  Assessment/Plan Present on Admission:  Acute ischemic stroke ([place])  GERD (gastroesophageal reflux disease)  Hypokalemia  Tobacco use  Principal Problem:   Acute ischemic stroke ([place]) Active Problems:   GERD (gastroesophageal reflux disease)   Tobacco use   Hypokalemia   Mixed hyperlipidemia  Acute ischemic stroke Patient has history of prior ischemic stroke without residual deficits MRI head with and without contrast showed multiple acute infarcts in the high left frontal and parietal lobes, potentially watershed. Patient will be  admitted to telemetry unit  Echocardiogram in the morning Continue Plavix and statin Continue fall precautions and neuro checks Lipid panel and hemoglobin A1c will be checked Continue PT/SLP/OT eval and treat Bedside swallow eval by nursing prior to diet Tele neurology will be consulted in the morning.    Hypokalemia K+ is 2.9 K+ will be replenished Please monitor for AM K+ for further replenishmemnt  Mixed hyperlipidemia Continue statin  GERD Continue Protonix  Tobacco abuse Patient was counseled on tobacco abuse cessation  DVT prophylaxis: Lovenox  Code Status: Full code  Consults: Teleneurology  Family Communication: Husband at bedside (all questions answered to satisfaction)  Severity of Illness: The appropriate patient status for this patient is OBSERVATION. Observation status is judged to be reasonable and necessary in order to provide the required intensity of service to ensure the patient's safety. The patient's presenting symptoms, physical exam findings, and initial radiographic and laboratory data in the context of their medical condition is felt to place them at decreased risk for further clinical deterioration. Furthermore, it is anticipated that the patient will be medically stable for discharge from the hospital within 2 midnights of admission.   Author: Bernadette Hoit, DO 09/01/2022 8:51 PM  For on call review [email or web address].

## 2022-09-01 NOTE — ED Provider Notes (Signed)
[facility] EMERGENCY DEPARTMENT Provider Note   CSN: [id number] Arrival date & time: 09/01/22  1504     History Chief Complaint  Patient presents with  . Aphasia    Rita [name] is a 56 y.o. female with history of stroke with chronic residual peripheral field deficits who presents to the emergency department today with right-sided weakness and slurred speech that started yesterday.  Patient states that she went to bed Tuesday evening feeling normal and woke up Wednesday morning with right-sided weakness and tingliness.  As the day progressed, the patient had increasing trouble speaking in addition to weakness and numbness.  Patient is anticoagulated with Plavix although she does state that she misses doses from time to time.  She does not believe she is missed any doses in the last week.  HPI     Home Medications Prior to Admission medications   Medication Sig Start Date End Date Taking? Authorizing Provider  atorvastatin (LIPITOR) 80 MG tablet Take 1 tablet (80 mg total) by mouth daily at 6 PM. 01/24/19  Yes Danford, Suann Larry, MD  Buprenorphine HCl-Naloxone HCl 8-2 MG FILM Take 1 Film by mouth 3 (three) times daily. 12/14/18  Yes [provider]  clopidogrel (PLAVIX) 75 MG tablet Take 1 tablet (75 mg total) by mouth daily. 01/24/19  Yes Danford, Suann Larry, MD  DEXILANT 60 MG capsule TAKE 1 CAPSULE(60 MG) BY MOUTH AT BEDTIME Patient taking differently: Take 60 mg by mouth daily. 02/19/19  Yes Annitta Needs, NP  potassium chloride (MICRO-K) 10 MEQ CR capsule Take 10 mEq by mouth 2 (two) times daily. 05/26/22  Yes [provider]  colchicine 0.6 MG tablet Take 1 tablet (0.6 mg total) by mouth 2 (two) times daily for 7 days. Patient not taking: Reported on 09/01/2022 09/20/19 09/27/19  Janeece Fitting, PA-C      Allergies    Patient has no known allergies.    Review of Systems   Review of Systems  All other systems reviewed and are negative.   Physical  Exam Updated Vital Signs BP 124/80   Pulse (!) 58   Temp 98.8 F (37.1 C) (Oral)   Resp 14   Ht '5\' 7"'$  (1.702 m)   Wt 101.5 kg   LMP 02/09/2012   SpO2 93%   BMI 35.05 kg/m  Physical Exam Vitals and nursing note reviewed.  Constitutional:      General: She is not in acute distress.    Appearance: Normal appearance.  HENT:     Head: Normocephalic and atraumatic.  Eyes:     General:        Right eye: No discharge.        Left eye: No discharge.  Cardiovascular:     Comments: Regular rate and rhythm.  S1/S2 are distinct without any evidence of murmur, rubs, or gallops.  Radial pulses are 2+ bilaterally.  Dorsalis pedis pulses are 2+ bilaterally.  No evidence of pedal edema. Pulmonary:     Comments: Clear to auscultation bilaterally.  Normal effort.  No respiratory distress.  No evidence of wheezes, rales, or rhonchi heard throughout. Abdominal:     General: Abdomen is flat. Bowel sounds are normal. There is no distension.     Tenderness: There is no abdominal tenderness. There is no guarding or rebound.  Musculoskeletal:        General: Normal range of motion.     Cervical back: Neck supple.  Skin:    General: Skin is warm  and dry.     Findings: No rash.  Neurological:     General: No focal deficit present.     Mental Status: She is alert and oriented to person, place, and time.     Comments: Cranial nerves are intact apart from trigeminal nerve.  Patient has clear weakness in the right upper and right lower extremity with resistance.  There is subjective decrease sensation in the right upper and right lower extremity.  Psychiatric:        Mood and Affect: Mood normal.        Behavior: Behavior normal.     ED Results / Procedures / Treatments   Labs (all labs ordered are listed, but only abnormal results are displayed) Labs Reviewed  COMPREHENSIVE METABOLIC PANEL - Abnormal; Notable for the following components:      Result Value   Potassium 2.9 (*)    Glucose, Bld  139 (*)    All other components within normal limits  GLUCOSE, CAPILLARY - Abnormal; Notable for the following components:   Glucose-Capillary 154 (*)    All other components within normal limits  I-STAT CHEM 8, ED - Abnormal; Notable for the following components:   Potassium 3.4 (*)    Glucose, Bld 142 (*)    Calcium, Ion 1.03 (*)    All other components within normal limits  PROTIME-INR  APTT  ETHANOL  CBC WITH DIFFERENTIAL/PLATELET  PROTIME-INR  APTT  CBG MONITORING, ED    EKG EKG Interpretation  Date/Time:  Thursday September 01 2022 17:10:31 EDT Ventricular Rate:  61 PR Interval:  171 QRS Duration: 176 QT Interval:  487 QTC Calculation: 491 R Axis:   -20 Text Interpretation: Sinus rhythm Right bundle branch block Left ventricular hypertrophy Similar to last tracing Confirmed by Georgina Snell [phone]) on 09/01/2022 5:12:04 PM  Radiology MR Brain W and Wo Contrast  Result Date: 09/01/2022 CLINICAL DATA:  Neuro deficit, acute, stroke suspected EXAM: MRI HEAD WITHOUT AND WITH CONTRAST MRA HEAD WITHOUT CONTRAST TECHNIQUE: Multiplanar, multiecho pulse sequences of the brain and surrounding structures were obtained without and with intravenous contrast. Angiographic images of the Circle of Willis were obtained using MRA technique without intravenous contrast. CONTRAST:  64m GADAVIST GADOBUTROL 1 MMOL/ML IV SOLN COMPARISON:  MRI head 08/11/2021.  MRA 01/21/2019. FINDINGS: MRI HEAD FINDINGS Brain: Multiple acute infarcts in the high left frontal and parietal lobes, potentially watershed and/or left MCA territory. Many other remote infarcts, including in the corona radiata and cerebellum bilaterally. No evidence of acute hemorrhage, mass lesion, midline shift or hydrocephalus. Mild enhancement in the region of the infarct, probably enhance infarcts. Otherwise, no pathologic enhancement. Vascular: Detailed below. Skull and upper cervical spine: Normal marrow signal. Sinuses/Orbits: Mild  paranasal sinus mucosal thickening. No acute orbital findings. Other: No mastoid effusions. MRA HEAD FINDINGS Motion limited study. Anterior circulation: Limited assessment due to motion with bilateral intracranial ICAs, MCAs and ACAs grossly patent without visible proximal emergent large vessel occlusion. Posterior circulation: Limited study due to patient motion with the visualized basilar artery appearing patent. Only the distal most intradural vertebral arteries are imaged, which appear grossly patent. Poor assessment of the posterior cerebral arteries bilaterally with flow related signal in the P1 and proximal P2 segments and nondiagnostic evaluation more distally. Chronically small vertebrobasilar system. IMPRESSION: 1. Multiple acute infarcts in the high left frontal and parietal lobes, potentially watershed. 2. Motion limited MRA without definite evidence of emergent proximal large vessel occlusion. 3. Particularly limited evaluation of the PCAs with  nondiagnostic evaluation distal to the mid P2 PCAs bilaterally. A CTA could further characterize if clinically warranted. Electronically Signed   By: Margaretha Sheffield M.D.   On: 09/01/2022 18:44   MR ANGIO HEAD WO CONTRAST  Result Date: 09/01/2022 CLINICAL DATA:  Neuro deficit, acute, stroke suspected EXAM: MRI HEAD WITHOUT AND WITH CONTRAST MRA HEAD WITHOUT CONTRAST TECHNIQUE: Multiplanar, multiecho pulse sequences of the brain and surrounding structures were obtained without and with intravenous contrast. Angiographic images of the Circle of Willis were obtained using MRA technique without intravenous contrast. CONTRAST:  7m GADAVIST GADOBUTROL 1 MMOL/ML IV SOLN COMPARISON:  MRI head 08/11/2021.  MRA 01/21/2019. FINDINGS: MRI HEAD FINDINGS Brain: Multiple acute infarcts in the high left frontal and parietal lobes, potentially watershed and/or left MCA territory. Many other remote infarcts, including in the corona radiata and cerebellum bilaterally. No  evidence of acute hemorrhage, mass lesion, midline shift or hydrocephalus. Mild enhancement in the region of the infarct, probably enhance infarcts. Otherwise, no pathologic enhancement. Vascular: Detailed below. Skull and upper cervical spine: Normal marrow signal. Sinuses/Orbits: Mild paranasal sinus mucosal thickening. No acute orbital findings. Other: No mastoid effusions. MRA HEAD FINDINGS Motion limited study. Anterior circulation: Limited assessment due to motion with bilateral intracranial ICAs, MCAs and ACAs grossly patent without visible proximal emergent large vessel occlusion. Posterior circulation: Limited study due to patient motion with the visualized basilar artery appearing patent. Only the distal most intradural vertebral arteries are imaged, which appear grossly patent. Poor assessment of the posterior cerebral arteries bilaterally with flow related signal in the P1 and proximal P2 segments and nondiagnostic evaluation more distally. Chronically small vertebrobasilar system. IMPRESSION: 1. Multiple acute infarcts in the high left frontal and parietal lobes, potentially watershed. 2. Motion limited MRA without definite evidence of emergent proximal large vessel occlusion. 3. Particularly limited evaluation of the PCAs with nondiagnostic evaluation distal to the mid P2 PCAs bilaterally. A CTA could further characterize if clinically warranted. Electronically Signed   By: FMargaretha SheffieldM.D.   On: 09/01/2022 18:44   CT HEAD WO CONTRAST  Result Date: 09/01/2022 CLINICAL DATA:  Headache, slurred speech, right-sided weakness. Symptoms began yesterday. EXAM: CT HEAD WITHOUT CONTRAST TECHNIQUE: Contiguous axial images were obtained from the base of the skull through the vertex without intravenous contrast. RADIATION DOSE REDUCTION: This exam was performed according to the departmental dose-optimization program which includes automated exposure control, adjustment of the mA and/or kV according to  patient size and/or use of iterative reconstruction technique. COMPARISON:  CT head 08/11/2021 FINDINGS: Brain: No evidence of acute infarction, hemorrhage, hydrocephalus, extra-axial collection or mass lesion/mass effect. Chronic infarcts noted in the bilateral cerebellum, left occipital lobe, and left corona radiata. Vascular: No hyperdense vessel or unexpected calcification. Skull: Normal. Negative for fracture or focal lesion. Sinuses/Orbits: No acute finding. Trace mucosal thickening in the maxillary sinuses, left sphenoid sinus, and left frontal sinus as well as mild-to-moderate mucosal thickening in scattered bilateral anterior ethmoid air cells. Mastoid air cells are clear. Other: None. IMPRESSION: No acute intracranial abnormality. Chronic infarcts in the bilateral cerebellum, left occipital lobe, and left corona radiata. Electronically Signed   By: LIleana RoupM.D.   On: 09/01/2022 17:58    Procedures .Critical Care  Performed by: FHendricks Limes PA-C Authorized by: FHendricks Limes PA-C   Critical care provider statement:    Critical care time (minutes):  35   Critical care time was exclusive of:  Separately billable procedures and treating other patients   Critical  care was necessary to treat or prevent imminent or life-threatening deterioration of the following conditions:  CNS failure or compromise   Critical care was time spent personally by me on the following activities:  Ordering and performing treatments and interventions, ordering and review of laboratory studies, ordering and review of radiographic studies, pulse oximetry, re-evaluation of patient's condition, blood draw for specimens, development of treatment plan with patient or surrogate and discussions with consultants     Medications Ordered in ED Medications  sodium chloride flush (NS) 0.9 % injection 3 mL (3 mLs Intravenous Not Given 09/01/22 1534)  potassium chloride SA (KLOR-CON M) CR tablet 40 mEq (40 mEq Oral  Given 09/01/22 1848)  gadobutrol (GADAVIST) 1 MMOL/ML injection 10 mL (10 mLs Intravenous Contrast Given 09/01/22 1818)    ED Course/ Medical Decision Making/ A&P Clinical Course as of 09/01/22 1910  Thu Sep 01, 2022  1849 Comprehensive metabolic panel(!) Hypokalemia but otherwise normal. [CF]  1850 I-stat chem 8, ED(!) Hypokalemia. [CF]  1850 Ethanol Negative. [CF]  1850 Protime-INR Normal [CF]  1850 Glucose, capillary(!) Normal. [CF]  3299 MR Brain W and Wo Contrast Personally ordered MRI brain with and without contrast which shows acute stroke.  I do agree with the radiologist interpretation. [CF]  1851 MR ANGIO HEAD WO CONTRAST I personally ordered an MRA head with and without contrast which shows acute stroke.  I do agree with the radiologist interpretation. [CF]  1851 CT HEAD WO CONTRAST No evidence of acute intracranial hemorrhage. [CF]  1903 I spoke with Dr. Rory Percy with neurology who recommends admission and should contact Dr. Hortense Ramal in the morning for further evaluation. She can stay at [facility]  [CF]    Clinical Course User Index [CF] Hendricks Limes, PA-C                           Medical Decision Making AKESHA URESTI is a 56 y.o. female patient who presents to the emergency department today for evaluation of right-sided weakness and slurred speech.  Signs and symptoms are concerning for stroke.  Patient is outside the window for tPA and LVO.  We will get stroke labs and plan to get a CT head without contrast in addition to MRI brain with and without contrast.  MRI of the brain shows evidence of acute stroke.  I do feel the patient would benefit from further evaluation in the hospital.  Feel that neurology should be contacted and then likely admission.  Due to shift change, was her work-up will be transferred to [facility] where ultimate disposition will be made.   Amount and/or Complexity of Data Reviewed Labs: ordered. Decision-making details documented in ED  Course. Radiology: ordered. Decision-making details documented in ED Course.  Risk Prescription drug management.   Final Clinical Impression(s) / ED Diagnoses Final diagnoses:  None    Rx / DC Orders ED Discharge Orders     None         Hendricks Limes, PA-C 09/01/22 1856    Elgie Congo, MD 09/02/22 [phone]

## 2022-09-01 NOTE — ED Triage Notes (Signed)
Pt presents with headache, slurred speech and right side weakness, symptoms began on yesterday, PMH include stroke.

## 2022-09-02 ENCOUNTER — Other Ambulatory Visit (HOSPITAL_COMMUNITY): Payer: Self-pay | Admitting: *Deleted

## 2022-09-02 ENCOUNTER — Inpatient Hospital Stay (HOSPITAL_COMMUNITY): Payer: Medicare Other

## 2022-09-02 ENCOUNTER — Encounter (HOSPITAL_COMMUNITY): Payer: Self-pay | Admitting: Internal Medicine

## 2022-09-02 ENCOUNTER — Observation Stay (HOSPITAL_COMMUNITY): Payer: Medicare Other

## 2022-09-02 DIAGNOSIS — F17210 Nicotine dependence, cigarettes, uncomplicated: Secondary | ICD-10-CM | POA: Diagnosis present

## 2022-09-02 DIAGNOSIS — E860 Dehydration: Secondary | ICD-10-CM | POA: Diagnosis present

## 2022-09-02 DIAGNOSIS — I6389 Other cerebral infarction: Secondary | ICD-10-CM

## 2022-09-02 DIAGNOSIS — I63232 Cerebral infarction due to unspecified occlusion or stenosis of left carotid arteries: Secondary | ICD-10-CM | POA: Diagnosis not present

## 2022-09-02 DIAGNOSIS — I639 Cerebral infarction, unspecified: Secondary | ICD-10-CM | POA: Diagnosis present

## 2022-09-02 DIAGNOSIS — M1990 Unspecified osteoarthritis, unspecified site: Secondary | ICD-10-CM | POA: Diagnosis present

## 2022-09-02 DIAGNOSIS — K861 Other chronic pancreatitis: Secondary | ICD-10-CM | POA: Diagnosis present

## 2022-09-02 DIAGNOSIS — I671 Cerebral aneurysm, nonruptured: Secondary | ICD-10-CM | POA: Diagnosis not present

## 2022-09-02 DIAGNOSIS — E669 Obesity, unspecified: Secondary | ICD-10-CM | POA: Diagnosis present

## 2022-09-02 DIAGNOSIS — F1120 Opioid dependence, uncomplicated: Secondary | ICD-10-CM

## 2022-09-02 DIAGNOSIS — R7302 Impaired glucose tolerance (oral): Secondary | ICD-10-CM | POA: Diagnosis not present

## 2022-09-02 DIAGNOSIS — Z6835 Body mass index (BMI) 35.0-35.9, adult: Secondary | ICD-10-CM | POA: Diagnosis not present

## 2022-09-02 DIAGNOSIS — E66812 Obesity, class 2: Secondary | ICD-10-CM

## 2022-09-02 DIAGNOSIS — E876 Hypokalemia: Secondary | ICD-10-CM | POA: Diagnosis present

## 2022-09-02 DIAGNOSIS — R001 Bradycardia, unspecified: Secondary | ICD-10-CM | POA: Diagnosis present

## 2022-09-02 DIAGNOSIS — R918 Other nonspecific abnormal finding of lung field: Secondary | ICD-10-CM

## 2022-09-02 DIAGNOSIS — I708 Atherosclerosis of other arteries: Secondary | ICD-10-CM | POA: Diagnosis present

## 2022-09-02 DIAGNOSIS — I63233 Cerebral infarction due to unspecified occlusion or stenosis of bilateral carotid arteries: Secondary | ICD-10-CM | POA: Diagnosis present

## 2022-09-02 DIAGNOSIS — M545 Low back pain, unspecified: Secondary | ICD-10-CM | POA: Diagnosis present

## 2022-09-02 DIAGNOSIS — R4701 Aphasia: Secondary | ICD-10-CM | POA: Diagnosis present

## 2022-09-02 DIAGNOSIS — R739 Hyperglycemia, unspecified: Secondary | ICD-10-CM | POA: Diagnosis present

## 2022-09-02 DIAGNOSIS — K2270 Barrett's esophagus without dysplasia: Secondary | ICD-10-CM | POA: Diagnosis present

## 2022-09-02 DIAGNOSIS — F32A Depression, unspecified: Secondary | ICD-10-CM | POA: Diagnosis present

## 2022-09-02 DIAGNOSIS — J449 Chronic obstructive pulmonary disease, unspecified: Secondary | ICD-10-CM | POA: Diagnosis present

## 2022-09-02 DIAGNOSIS — I6522 Occlusion and stenosis of left carotid artery: Secondary | ICD-10-CM | POA: Diagnosis not present

## 2022-09-02 DIAGNOSIS — I10 Essential (primary) hypertension: Secondary | ICD-10-CM | POA: Diagnosis present

## 2022-09-02 DIAGNOSIS — I720 Aneurysm of carotid artery: Secondary | ICD-10-CM | POA: Diagnosis present

## 2022-09-02 DIAGNOSIS — K219 Gastro-esophageal reflux disease without esophagitis: Secondary | ICD-10-CM | POA: Diagnosis present

## 2022-09-02 DIAGNOSIS — R29705 NIHSS score 5: Secondary | ICD-10-CM | POA: Diagnosis present

## 2022-09-02 DIAGNOSIS — G8321 Monoplegia of upper limb affecting right dominant side: Secondary | ICD-10-CM | POA: Diagnosis present

## 2022-09-02 DIAGNOSIS — N179 Acute kidney failure, unspecified: Secondary | ICD-10-CM | POA: Diagnosis present

## 2022-09-02 DIAGNOSIS — E782 Mixed hyperlipidemia: Secondary | ICD-10-CM | POA: Diagnosis present

## 2022-09-02 LAB — HEMOGLOBIN A1C
Hgb A1c MFr Bld: 6.2 % — ABNORMAL HIGH (ref 4.8–5.6)
Mean Plasma Glucose: 131.24 mg/dL

## 2022-09-02 LAB — CBC
HCT: 41.2 % (ref 36.0–46.0)
Hemoglobin: 13.5 g/dL (ref 12.0–15.0)
MCH: 28.5 pg (ref 26.0–34.0)
MCHC: 32.8 g/dL (ref 30.0–36.0)
MCV: 86.9 fL (ref 80.0–100.0)
Platelets: 362 10*3/uL (ref 150–400)
RBC: 4.74 MIL/uL (ref 3.87–5.11)
RDW: 14.4 % (ref 11.5–15.5)
WBC: 9.4 10*3/uL (ref 4.0–10.5)
nRBC: 0 % (ref 0.0–0.2)

## 2022-09-02 LAB — ECHOCARDIOGRAM COMPLETE
AR max vel: 1.6 cm2
AV Area VTI: 1.9 cm2
AV Area mean vel: 1.77 cm2
AV Mean grad: 9 mmHg
AV Peak grad: 19.9 mmHg
Ao pk vel: 2.23 m/s
Area-P 1/2: 2.34 cm2
Height: 67 in
S' Lateral: 2.4 cm
Weight: 3612.02 oz

## 2022-09-02 LAB — COMPREHENSIVE METABOLIC PANEL
ALT: 25 U/L (ref 0–44)
AST: 19 U/L (ref 15–41)
Albumin: 3.7 g/dL (ref 3.5–5.0)
Alkaline Phosphatase: 74 U/L (ref 38–126)
Anion gap: 10 (ref 5–15)
BUN: 10 mg/dL (ref 6–20)
CO2: 27 mmol/L (ref 22–32)
Calcium: 8.8 mg/dL — ABNORMAL LOW (ref 8.9–10.3)
Chloride: 104 mmol/L (ref 98–111)
Creatinine, Ser: 0.78 mg/dL (ref 0.44–1.00)
GFR, Estimated: >60 mL/min (ref >60)
Glucose, Bld: 103 mg/dL — ABNORMAL HIGH (ref 70–99)
Potassium: 3 mmol/L — ABNORMAL LOW (ref 3.5–5.1)
Sodium: 141 mmol/L (ref 135–145)
Total Bilirubin: 0.6 mg/dL (ref 0.3–1.2)
Total Protein: 7.1 g/dL (ref 6.5–8.1)

## 2022-09-02 LAB — MAGNESIUM: Magnesium: 1.8 mg/dL (ref 1.7–2.4)

## 2022-09-02 LAB — PHOSPHORUS: Phosphorus: 3.3 mg/dL (ref 2.5–4.6)

## 2022-09-02 LAB — HIV ANTIBODY (ROUTINE TESTING W REFLEX): HIV Screen 4th Generation wRfx: NONREACTIVE

## 2022-09-02 MED ORDER — IOHEXOL 350 MG/ML SOLN
100.0000 mL | Freq: Once | INTRAVENOUS | Status: AC | PRN
  Administered 2022-09-02: 75 mL via INTRAVENOUS

## 2022-09-02 MED ORDER — ASPIRIN 81 MG PO TBEC
81.0000 mg | DELAYED_RELEASE_TABLET | Freq: Every day | ORAL | Status: DC
  Administered 2022-09-02 – 2022-09-09 (×8): 81 mg via ORAL
  Filled 2022-09-02 (×8): qty 1

## 2022-09-02 MED ORDER — ALUM & MAG HYDROXIDE-SIMETH 200-200-20 MG/5ML PO SUSP
15.0000 mL | Freq: Four times a day (QID) | ORAL | Status: DC | PRN
  Administered 2022-09-03 – 2022-09-04 (×2): 15 mL via ORAL
  Filled 2022-09-02 (×5): qty 30

## 2022-09-02 MED ORDER — NICOTINE 14 MG/24HR TD PT24
14.0000 mg | MEDICATED_PATCH | Freq: Every day | TRANSDERMAL | Status: DC
  Administered 2022-09-02 – 2022-09-09 (×7): 14 mg via TRANSDERMAL
  Filled 2022-09-02 (×7): qty 1

## 2022-09-02 MED ORDER — PERFLUTREN LIPID MICROSPHERE
1.0000 mL | INTRAVENOUS | Status: AC | PRN
  Administered 2022-09-02: 3 mL via INTRAVENOUS

## 2022-09-02 MED ORDER — ONDANSETRON HCL 4 MG/2ML IJ SOLN
4.0000 mg | Freq: Four times a day (QID) | INTRAMUSCULAR | Status: DC | PRN
  Administered 2022-09-02 – 2022-09-08 (×13): 4 mg via INTRAVENOUS
  Filled 2022-09-02 (×13): qty 2

## 2022-09-02 MED ORDER — BUPRENORPHINE HCL-NALOXONE HCL 8-2 MG SL SUBL
1.0000 | SUBLINGUAL_TABLET | Freq: Every day | SUBLINGUAL | Status: DC
  Administered 2022-09-02 – 2022-09-09 (×7): 1 via SUBLINGUAL
  Filled 2022-09-02 (×7): qty 1

## 2022-09-02 MED ORDER — TICAGRELOR 90 MG PO TABS
90.0000 mg | ORAL_TABLET | Freq: Two times a day (BID) | ORAL | Status: DC
  Administered 2022-09-02 – 2022-09-08 (×13): 90 mg via ORAL
  Filled 2022-09-02 (×13): qty 1

## 2022-09-02 NOTE — Assessment & Plan Note (Signed)
Tobacco cessation discussed 

## 2022-09-02 NOTE — Evaluation (Signed)
Occupational Therapy Evaluation Patient Details Name: Rita [name] MRN: [id number] DOB: 03-12-1966 Today's Date: 09/02/2022   History of Present Illness Rita [name] is a 56 y.o. female with medical history significant of hyperlipidemia, Plavix, history of left occipital CVA without residual deficits (2020), tobacco abuse who presented to the emergency department due to right-sided weakness and slurred speech which started yesterday.  She states that she was in her normal state of health when she went to bed on Tuesday evening and on walking up yesterday (Wednesday) morning, she noted right-sided weakness which was worse in right arm.  Symptoms worsened as the day progressed with worsening slurred speech, weakness and numbness.  She states that she has been compliant with her statin and Plavix, though she endorsed missing some doses occasionally.  Patient continues to smoke 3 to 4 cigarettes daily.  She denies headache, chest pain, shortness of breath, nausea, vomiting, abdominal pain (per DO)   Clinical Impression   Pt agreeable to OT and PT co-evaluation. Pt presents with significant R UE weakness with decreased sensation and decreased gross and fine motor coordination. Pt has peripheral vision deficits at baseline from a previous stroke but she does reporting worsening of deficits in the past day. Pt requires assist to open containers due to weak pinch and gross grasp in R UE. Pt was left in bed with call bell within reach. Pt will benefit from continued OT in the hospital and recommended venue below to increase strength, balance, and endurance for safe ADL's.        Recommendations for follow up therapy are one component of a multi-disciplinary discharge planning process, led by the attending physician.  Recommendations may be updated based on patient status, additional functional criteria and insurance authorization.   Follow Up Recommendations  Outpatient OT    Assistance Recommended at  Discharge Set up Supervision/Assistance  Patient can return home with the following Assistance with cooking/housework;Assist for transportation;Assistance with feeding    Functional Status Assessment  Patient has had a recent decline in their functional status and demonstrates the ability to make significant improvements in function in a reasonable and predictable amount of time.  Equipment Recommendations  None recommended by OT    Recommendations for Other Services       Precautions / Restrictions Precautions Precautions: Fall Restrictions Weight Bearing Restrictions: No      Mobility Bed Mobility Overal bed mobility: Modified Independent             General bed mobility comments: HOB elevated.    Transfers Overall transfer level: Needs assistance Equipment used: None Transfers: Sit to/from Stand, Bed to chair/wheelchair/BSC Sit to Stand: Supervision, Modified independent (Device/Increase time)     Step pivot transfers: Modified independent (Device/Increase time), Supervision     General transfer comment: Mildly unsteady in standing. No physical assist needed.      Balance Overall balance assessment: Mild deficits observed, not formally tested                                         ADL either performed or assessed with clinical judgement   ADL Overall ADL's : Needs assistance/impaired     Grooming: Standing;Oral care;Set up;Min guard Grooming Details (indicate cue type and reason): Pt is unable to opent toothpaste at thit time.     Lower Body Bathing: Modified independent;Sitting/lateral leans   Upper Body Dressing : Modified  independent;Sitting   Lower Body Dressing: Modified independent;Sitting/lateral leans   Toilet Transfer: Supervision/safety;Ambulation;Modified Independent Toilet Transfer Details (indicate cue type and reason): Simualted via ambulation in hall from bed and return to bed without AD.         Functional  mobility during ADLs: Supervision/safety       Vision Baseline Vision/History: 1 Wears glasses Ability to See in Adequate Light: 2 Moderately impaired Patient Visual Report: Peripheral vision impairment (Pt has history of peripheral vision impairment since her previous stroke. Pt does report symptoms are worse today.) Vision Assessment?: Yes Eye Alignment: Within Functional Limits Tracking/Visual Pursuits: Able to track stimulus in all quads without difficulty Visual Fields: Right visual field deficit;Left visual field deficit (Noted to have both R and L visual field deficits peripherally. Pt has issues at baseline but reports worsening of symptoms.)     Perception     Praxis      Pertinent Vitals/Pain Pain Assessment Pain Assessment: 0-10 Pain Score: 10-Worst pain ever Pain Location: low back Pain Descriptors / Indicators: Grimacing Pain Intervention(s): Limited activity within patient's tolerance, Monitored during session, Repositioned     Hand Dominance Right   Extremity/Trunk Assessment Upper Extremity Assessment Upper Extremity Assessment: RUE deficits/detail RUE Deficits / Details: 3+/5 grossly. Poor fine motor control and deficits in gross motor control. Decreased sensation to light touch. Mild limitation in A/ROM but able to reach near full with extended time. RUE Sensation: decreased light touch RUE Coordination: decreased fine motor;decreased gross motor   Lower Extremity Assessment Lower Extremity Assessment: Defer to PT evaluation   Cervical / Trunk Assessment Cervical / Trunk Assessment: Normal   Communication Communication Communication: No difficulties   Cognition Arousal/Alertness: Awake/alert Behavior During Therapy: WFL for tasks assessed/performed Overall Cognitive Status: Within Functional Limits for tasks assessed                                                        Home Living Family/patient expects to be discharged  to:: Private residence Living Arrangements: Spouse/significant other Available Help at Discharge: Family;Available PRN/intermittently Type of Home: House Home Access: Stairs to enter [employer] of Steps: 1   Home Layout: One level     Bathroom Shower/Tub: Teacher, early years/pre: Standard     Home Equipment: Cane - single point          Prior Functioning/Environment Prior Level of Function : Independent/Modified Independent             Mobility Comments: Short distance community ambulator without AD. ADLs Comments: Independent. Pt drives.        OT Problem List: Decreased strength;Decreased range of motion;Decreased activity tolerance;Impaired vision/perception;Impaired UE functional use;Pain      OT Treatment/Interventions: Self-care/ADL training;Therapeutic exercise;Neuromuscular education;Therapeutic activities;Visual/perceptual remediation/compensation;Patient/family education;Balance training    OT Goals(Current goals can be found in the care plan section) Acute Rehab OT Goals Patient Stated Goal: Open to outpatient therapy. OT Goal Formulation: With patient Time For Goal Achievement: 09/16/22 Potential to Achieve Goals: Good  OT Frequency: Min 1X/week    Co-evaluation PT/OT/SLP Co-Evaluation/Treatment: Yes Reason for Co-Treatment: To address functional/ADL transfers   OT goals addressed during session: ADL's and self-care      AM-PAC OT "6 Clicks" Daily Activity     Outcome Measure Help from another person eating meals?: A Little Help  from another person taking care of personal grooming?: A Little Help from another person toileting, which includes using toliet, bedpan, or urinal?: None Help from another person bathing (including washing, rinsing, drying)?: None Help from another person to put on and taking off regular upper body clothing?: None Help from another person to put on and taking off regular lower body clothing?:  None 6 Click Score: 22   End of Session    Activity Tolerance: Patient tolerated treatment well Patient left: in bed;with call bell/phone within reach  OT Visit Diagnosis: Muscle weakness (generalized) (M62.81);Hemiplegia and hemiparesis;Low vision, both eyes (H54.2) Hemiplegia - Right/Left: Right Hemiplegia - dominant/non-dominant: Dominant Hemiplegia - caused by: Cerebral infarction                Time: 0811-0828 OT Time Calculation (min): 17 min Charges:  OT General Charges $OT Visit: 1 Visit OT Evaluation $OT Eval Low Complexity: 1 Low  Kalista Laguardia OT, MOT   Larey Seat 09/02/2022, 8:46 AM

## 2022-09-02 NOTE — Progress Notes (Addendum)
PROGRESS NOTE  Rita [name] AVW:[id number] DOB: Feb 02, 1966 DOA: 09/01/2022 PCP: [facility]  Brief History:  56 year old female with a history of Barrett's esophagus, chronic back pain, chronic abdominal pain, COPD, tobacco abuse, stroke, hypertension, depression, hyperlipidemia presenting with right arm weakness.  She noted the right arm weakness when she woke up on the morning of 08/31/2022.  She was normal in her usual state of health when she went to bed on the evening of 08/30/2022.  Throughout the day on 08/31/2022, her right arm weakness continued to progress.  When she woke up on the morning of 09/01/2022, her weakness has continued to worsen with development of some slurred speech and numbness in her right hand.  As result, the patient presented for further evaluation and treatment.  She denies any fevers, chills, headache, chest pain, shortness breath, cough, hemoptysis.  She continues to smoke about 3 to 4 cigarettes/day. When asked about her medications, the patient seems to have poor insight regarding what medication she is actually taking and not taking.  In the emergency department, the patient was afebrile and hemodynamically stable.  WBC 9.6, hemoglobin 13.7, platelets 274,000.  MRI of the brain showed multiple acute infarcts in the high left frontal and left parietal area.  MRI was negative for LVO but was limited for evaluation of the PCAs.  Neurologist at [facility] was consulted and recommended the patient can stay at [facility] and to consult with [facility]y     Assessment and Plan: * Acute ischemic stroke [facility]) -[place] [facility] Consult--discussed with Dr. Winfred Leeds to [facility] for IR (Dr. Kathi Ludwig) to eval for possible intracranial stenting -PT/OT evaluation -Speech therapy eval -CT brain--neg -MRI brain--multiple acute infarcts in the high left frontal and left parietal areas -MRA brain--negative LVO, limited evaluation of PCAs -CTA head and neck--Occluded left  vertebral artery at its origin. Small right vertebral artery is occluded at its V3 segment. Both intradural vertebral arteries are largely non-opacified with small/irregular basilar artery -Echo--pending -LDL--167 -HbA1C--6.2 -Antiplatelet--aspirin& plavix  Tobacco use Tobacco cessation discussed  GERD (gastroesophageal reflux disease) Continue pantoprazole  Impaired glucose tolerance A1C--6.2 Lifestyle modification Will need outpt follow up  Lung mass Pt had RLL lung mass in Jan 2020 --lost to follow up Repeat CT chest to evaluate  Obesity, Class II, BMI 35-39.9 BMI 35.26 Lifestyle modification  Opioid dependence ([place]) PDMP reviewed --Patient receives buprenorphine No. 90 on a monthly basis -- Resume buprenorphine  Mixed hyperlipidemia Continue statin Unclear regarding patient's compliance  Hypokalemia Replete Magnesium 1.8      Family Communication:   no Family at bedside  Consultants:  neurology  Code Status:  FULL   DVT Prophylaxis:   [place]owler Lovenox   Procedures: As Listed in Progress Note Above  Antibiotics: None     Subjective: Patient denies fevers, chills, headache, chest pain, dyspnea, nausea, vomiting, abdominal pain, dysuria, hematuria, hematochezia, and melena. She has some lose stool  Objective: Vitals:   09/02/22 0115 09/02/22 0516 09/02/22 0817 09/02/22 1300  BP: 121/66  139/84 128/65  Pulse: (!) 57  62 64  Resp: 18  20   Temp: 98.2 F (36.8 C) 97.8 F (36.6 C)  98.3 F (36.8 C)  TempSrc: Oral Oral  Oral  SpO2: 97% 97% 98% 100%  Weight:      Height:        Intake/Output Summary (Last 24 hours) at 09/02/2022 1416 Last data filed at 09/02/2022 1038 Gross per 24 hour  Intake 0 ml  Output 1 ml  Net -1 ml   Weight change:  Exam:  General:  Pt is alert, follows commands appropriately, not in acute distress HEENT: No icterus, No thrush, No neck mass, [place]/AT Cardiovascular: RRR, S1/S2, no rubs, no gallops Respiratory:  bibasilar rales.  No wheeze. Diminished BS Abdomen: Soft/+BS, non tender, non distended, no guarding Extremities: No edema, No lymphangitis, No petechiae, No rashes, no synovitis   Data Reviewed: I have personally reviewed following labs and imaging studies Basic Metabolic Panel: Recent Labs  Lab 09/01/22 1550 09/01/22 1605 09/01/22 2128 09/02/22 0303  NA 139 138  --  141  K 3.4* 2.9*  --  3.0*  CL 102 103  --  104  CO2  --  24  --  27  GLUCOSE 142* 139*  --  103*  BUN 10 9  --  10  CREATININE 0.80 0.91 0.80 0.78  CALCIUM  --  9.1  --  8.8*  MG  --   --   --  1.8  PHOS  --   --   --  3.3   Liver Function Tests: Recent Labs  Lab 09/01/22 1605 09/02/22 0303  AST 20 19  ALT 25 25  ALKPHOS 78 74  BILITOT 0.6 0.6  PROT 7.3 7.1  ALBUMIN 3.9 3.7   No results for input(s): "LIPASE", "AMYLASE" in the last 168 hours. No results for input(s): "AMMONIA" in the last 168 hours. Coagulation Profile: Recent Labs  Lab 09/01/22 1605 09/01/22 2128  INR 0.9 0.9   CBC: Recent Labs  Lab 09/01/22 1550 09/01/22 2015 09/01/22 2127 09/01/22 2128 09/02/22 0303  WBC  --  9.6 10.5 TEST WILL BE CREDITED 9.4  NEUTROABS  --   --  6.7  --   --   HGB 13.9 13.7 13.9 TEST WILL BE CREDITED 13.5  HCT 41.0 40.0 41.9 TEST WILL BE CREDITED 41.2  MCV  --  84.7 87.5 TEST WILL BE CREDITED 86.9  PLT  --  374 350 TEST WILL BE CREDITED 362   Cardiac Enzymes: No results for input(s): "CKTOTAL", "CKMB", "CKMBINDEX", "TROPONINI" in the last 168 hours. BNP: Invalid input(s): "POCBNP" CBG: Recent Labs  Lab 09/01/22 1526  GLUCAP 154*   HbA1C: Recent Labs    09/02/22 0303  HGBA1C 6.2*   Urine analysis:    Component Value Date/Time   COLORURINE YELLOW 01/20/2019 1430   APPEARANCEUR CLEAR 01/20/2019 1430   LABSPEC 1.013 01/20/2019 1430   PHURINE 8.0 01/20/2019 1430   GLUCOSEU NEGATIVE 01/20/2019 1430   HGBUR NEGATIVE 01/20/2019 1430   BILIRUBINUR NEGATIVE 01/20/2019 1430   KETONESUR 5  (A) 01/20/2019 1430   PROTEINUR NEGATIVE 01/20/2019 1430   UROBILINOGEN 0.2 02/05/2015 2347   NITRITE NEGATIVE 01/20/2019 1430   LEUKOCYTESUR NEGATIVE 01/20/2019 1430   Sepsis Labs: '@LABRCNTIP'$ (procalcitonin:4,lacticidven:4) )No results found for this or any previous visit (from the past 240 hour(s)).   Scheduled Meds:  aspirin EC  81 mg Oral Daily   atorvastatin  80 mg Oral q1800   buprenorphine-naloxone  1 tablet Sublingual Daily   enoxaparin (LOVENOX) injection  50 mg Subcutaneous Q24H   nicotine  14 mg Transdermal Daily   pantoprazole  40 mg Oral Daily   sodium chloride flush  3 mL Intravenous Once   ticagrelor  90 mg Oral BID   Continuous Infusions:  Procedures/Studies: CT ANGIO HEAD NECK W WO CM  Result Date: 09/02/2022 CLINICAL DATA:  Neuro deficit, acute, stroke suspected acute stroke  on MR brain EXAM: CT ANGIOGRAPHY HEAD AND NECK TECHNIQUE: Multidetector CT imaging of the head and neck was performed using the standard protocol during bolus administration of intravenous contrast. Multiplanar CT image reconstructions and MIPs were obtained to evaluate the vascular anatomy. Carotid stenosis measurements (when applicable) are obtained utilizing NASCET criteria, using the distal internal carotid diameter as the denominator. RADIATION DOSE REDUCTION: This exam was performed according to the departmental dose-optimization program which includes automated exposure control, adjustment of the mA and/or kV according to patient size and/or use of iterative reconstruction technique. CONTRAST:  35m OMNIPAQUE IOHEXOL 350 MG/ML SOLN COMPARISON:  None Available. FINDINGS: CT HEAD FINDINGS Brain: Left frontoparietal infarcts better characterized on recent MRI. No evidence of progressive mass effect or acute hemorrhage. Multiple remote infarcts including in the cerebellum, left occipital lobe and left corona radiata. No evidence of hydrocephalus or mass lesion. Vascular: See below. Skull: No acute  fracture. Sinuses/Orbits: Clear sinuses. Other: No mastoid effusions. Review of the MIP images confirms the above findings CTA NECK FINDINGS Aortic arch: Occlusion of the proximal left subclavian artery with distal reconstitution. Right carotid system: Atherosclerosis at the carotid bifurcation involving the proximal ICA without greater than 50% stenosis. Left carotid system: No greater than 50% stenosis. Atherosclerosis at the carotid bifurcation involving the proximal ICA. Vertebral arteries: Left vertebral artery is occluded at its origin with 4/irregular distal V2 reconstitution. Right vertebral artery is small, but patent proximally with occlusion at the V3 segment. Skeleton: No acute findings. Other neck: No acute findings. Upper chest: Visualized lung apices are clear. Review of the MIP images confirms the above findings CTA HEAD FINDINGS Anterior circulation: Bilateral intracranial ICAs are patent with areas of atherosclerotic narrowing, including moderate to severe left cavernous and paraclinoid and moderate right paraclinoid ICA stenosis. Mild high bilateral M1 MCA stenosis. Bilateral M1 and proximal M2 MCA vessels are patent. Bilateral ACAs are patent without proximal hemodynamically significant stenosis. Posterior circulation: Both intradural vertebral arteries are largely non-opacified with small/irregular basilar artery. The PCAs are small bilaterally but appear patent. Venous sinuses: As permitted by contrast timing, patent. Review of the MIP images confirms the above findings IMPRESSION: 1. Occluded left vertebral artery at its origin. Small right vertebral artery is occluded at its V3 segment. Both intradural vertebral arteries are largely non-opacified with small/irregular basilar artery. Small but patent PCAs. 2. Occlusion of the proximal left subclavian artery with distal reconstitution. 3. Moderate to severe left cavernous and paraclinoid ICA and moderate right paraclinoid ICA stenosis. 4. Mild  bilateral M1 MCA stenosis. These results will be called to the ordering clinician or representative by the Radiologist Assistant, and communication documented in the PACS or [employer] Electronically Signed   By: FMargaretha SheffieldM.D.   On: 09/02/2022 10:27   MR Brain W and Wo Contrast  Result Date: 09/01/2022 CLINICAL DATA:  Neuro deficit, acute, stroke suspected EXAM: MRI HEAD WITHOUT AND WITH CONTRAST MRA HEAD WITHOUT CONTRAST TECHNIQUE: Multiplanar, multiecho pulse sequences of the brain and surrounding structures were obtained without and with intravenous contrast. Angiographic images of the Circle of Willis were obtained using MRA technique without intravenous contrast. CONTRAST:  165mGADAVIST GADOBUTROL 1 MMOL/ML IV SOLN COMPARISON:  MRI head 08/11/2021.  MRA 01/21/2019. FINDINGS: MRI HEAD FINDINGS Brain: Multiple acute infarcts in the high left frontal and parietal lobes, potentially watershed and/or left MCA territory. Many other remote infarcts, including in the corona radiata and cerebellum bilaterally. No evidence of acute hemorrhage, mass lesion, midline shift or hydrocephalus.  Mild enhancement in the region of the infarct, probably enhance infarcts. Otherwise, no pathologic enhancement. Vascular: Detailed below. Skull and upper cervical spine: Normal marrow signal. Sinuses/Orbits: Mild paranasal sinus mucosal thickening. No acute orbital findings. Other: No mastoid effusions. MRA HEAD FINDINGS Motion limited study. Anterior circulation: Limited assessment due to motion with bilateral intracranial ICAs, MCAs and ACAs grossly patent without visible proximal emergent large vessel occlusion. Posterior circulation: Limited study due to patient motion with the visualized basilar artery appearing patent. Only the distal most intradural vertebral arteries are imaged, which appear grossly patent. Poor assessment of the posterior cerebral arteries bilaterally with flow related signal in the P1 and  proximal P2 segments and nondiagnostic evaluation more distally. Chronically small vertebrobasilar system. IMPRESSION: 1. Multiple acute infarcts in the high left frontal and parietal lobes, potentially watershed. 2. Motion limited MRA without definite evidence of emergent proximal large vessel occlusion. 3. Particularly limited evaluation of the PCAs with nondiagnostic evaluation distal to the mid P2 PCAs bilaterally. A CTA could further characterize if clinically warranted. Electronically Signed   By: Margaretha Sheffield M.D.   On: 09/01/2022 18:44   MR ANGIO HEAD WO CONTRAST  Result Date: 09/01/2022 CLINICAL DATA:  Neuro deficit, acute, stroke suspected EXAM: MRI HEAD WITHOUT AND WITH CONTRAST MRA HEAD WITHOUT CONTRAST TECHNIQUE: Multiplanar, multiecho pulse sequences of the brain and surrounding structures were obtained without and with intravenous contrast. Angiographic images of the Circle of Willis were obtained using MRA technique without intravenous contrast. CONTRAST:  24m GADAVIST GADOBUTROL 1 MMOL/ML IV SOLN COMPARISON:  MRI head 08/11/2021.  MRA 01/21/2019. FINDINGS: MRI HEAD FINDINGS Brain: Multiple acute infarcts in the high left frontal and parietal lobes, potentially watershed and/or left MCA territory. Many other remote infarcts, including in the corona radiata and cerebellum bilaterally. No evidence of acute hemorrhage, mass lesion, midline shift or hydrocephalus. Mild enhancement in the region of the infarct, probably enhance infarcts. Otherwise, no pathologic enhancement. Vascular: Detailed below. Skull and upper cervical spine: Normal marrow signal. Sinuses/Orbits: Mild paranasal sinus mucosal thickening. No acute orbital findings. Other: No mastoid effusions. MRA HEAD FINDINGS Motion limited study. Anterior circulation: Limited assessment due to motion with bilateral intracranial ICAs, MCAs and ACAs grossly patent without visible proximal emergent large vessel occlusion. Posterior  circulation: Limited study due to patient motion with the visualized basilar artery appearing patent. Only the distal most intradural vertebral arteries are imaged, which appear grossly patent. Poor assessment of the posterior cerebral arteries bilaterally with flow related signal in the P1 and proximal P2 segments and nondiagnostic evaluation more distally. Chronically small vertebrobasilar system. IMPRESSION: 1. Multiple acute infarcts in the high left frontal and parietal lobes, potentially watershed. 2. Motion limited MRA without definite evidence of emergent proximal large vessel occlusion. 3. Particularly limited evaluation of the PCAs with nondiagnostic evaluation distal to the mid P2 PCAs bilaterally. A CTA could further characterize if clinically warranted. Electronically Signed   By: FMargaretha SheffieldM.D.   On: 09/01/2022 18:44   CT HEAD WO CONTRAST  Result Date: 09/01/2022 CLINICAL DATA:  Headache, slurred speech, right-sided weakness. Symptoms began yesterday. EXAM: CT HEAD WITHOUT CONTRAST TECHNIQUE: Contiguous axial images were obtained from the base of the skull through the vertex without intravenous contrast. RADIATION DOSE REDUCTION: This exam was performed according to the departmental dose-optimization program which includes automated exposure control, adjustment of the mA and/or kV according to patient size and/or use of iterative reconstruction technique. COMPARISON:  CT head 08/11/2021 FINDINGS: Brain: No evidence of acute  infarction, hemorrhage, hydrocephalus, extra-axial collection or mass lesion/mass effect. Chronic infarcts noted in the bilateral cerebellum, left occipital lobe, and left corona radiata. Vascular: No hyperdense vessel or unexpected calcification. Skull: Normal. Negative for fracture or focal lesion. Sinuses/Orbits: No acute finding. Trace mucosal thickening in the maxillary sinuses, left sphenoid sinus, and left frontal sinus as well as mild-to-moderate mucosal  thickening in scattered bilateral anterior ethmoid air cells. Mastoid air cells are clear. Other: None. IMPRESSION: No acute intracranial abnormality. Chronic infarcts in the bilateral cerebellum, left occipital lobe, and left corona radiata. Electronically Signed   By: Ileana Roup M.D.   On: 09/01/2022 17:58    Orson Eva, DO  [facility]  If 7PM-7AM, please contact night-coverage [email or web address] Password [email or web address] 09/02/2022, 2:16 PM   LOS: 0 days

## 2022-09-02 NOTE — Assessment & Plan Note (Signed)
PDMP reviewed --Patient receives buprenorphine No. 90 on a monthly basis -- Resume buprenorphine

## 2022-09-02 NOTE — Assessment & Plan Note (Signed)
A1C--6.2 Lifestyle modification Will need outpt follow up

## 2022-09-02 NOTE — Assessment & Plan Note (Addendum)
-  Appreciate Neurology Consult--discussed with Dr. Winfred Leeds to [facility] for IR (Dr. Kathi Ludwig) to eval for possible intracranial stenting -PT/OT evaluation -Speech therapy eval -CT brain--neg -MRI brain--multiple acute infarcts in the high left frontal and left parietal areas -MRA brain--negative LVO, limited evaluation of PCAs -CTA head and neck--Occluded left vertebral artery at its origin. Small right vertebral artery is occluded at its V3 segment. Both intradural vertebral arteries are largely non-opacified with small/irregular basilar artery -Echo--pending -LDL--167 -HbA1C--6.2 -Antiplatelet--aspirin& plavix

## 2022-09-02 NOTE — Assessment & Plan Note (Signed)
Replete Magnesium 1.8

## 2022-09-02 NOTE — Assessment & Plan Note (Signed)
Pt had RLL lung mass in Jan 2020 --lost to follow up Repeat CT chest to evaluate

## 2022-09-02 NOTE — Progress Notes (Signed)
  Transition of Care [facility]) Screening Note   Patient Details  Name: BETTINA WARN Date of Birth: [birth year]   Transition of Care [facility]) CM/SW Contact:    Ihor Gully, LCSW Phone Number: 09/02/2022, 2:37 PM    Transition of Care Department [facility]) has reviewed patient and no TOC needs have been identified at this time. We will continue to monitor patient advancement through interdisciplinary progression rounds. If new patient transition needs arise, please place a TOC consult.

## 2022-09-02 NOTE — Plan of Care (Signed)
  Problem: Acute Rehab OT Goals (only OT should resolve) Goal: Pt. Will Perform Grooming Flowsheets (Taken 09/02/2022 0849) Pt Will Perform Grooming:  with modified independence  standing Goal: Pt/Caregiver Will Perform Home Exercise Program Flowsheets (Taken 09/02/2022 [phone]) Pt/caregiver will Perform Home Exercise Program:  Increased strength  Increased ROM  Right Upper extremity  Independently Goal: OT Additional ADL Goal #1 Flowsheets (Taken 09/02/2022 0849) Additional ADL Goal #1: Pt will demosntrate improved dexterity and pinch/grip strength of R UE by opening a container of toothpaste independently.  Viraat Vanpatten OT, MOT

## 2022-09-02 NOTE — Plan of Care (Signed)
  Problem: Acute Rehab PT Goals(only PT should resolve) Goal: Patient Will Transfer Sit To/From Stand Outcome: Progressing Flowsheets (Taken 09/02/2022 1005) Patient will transfer sit to/from stand: Independently Goal: Pt Will Transfer Bed To Chair/Chair To Bed Outcome: Progressing Flowsheets (Taken 09/02/2022 1005) Pt will Transfer Bed to Chair/Chair to Bed: Independently Goal: Pt Will Ambulate Outcome: Progressing Flowsheets (Taken 09/02/2022 1005) Pt will Ambulate:  > 125 feet  Independently Goal: Pt/caregiver will Perform Home Exercise Program Outcome: Progressing Flowsheets (Taken 09/02/2022 1005) Pt/caregiver will Perform Home Exercise Program:  For increased strengthening  For improved balance  Independently  10:06 AM, 09/02/22 Mearl Latin PT, DPT Physical Therapist at [facility]

## 2022-09-02 NOTE — Evaluation (Signed)
Physical Therapy Evaluation Patient Details Name: Rita [name] MRN: [id number] DOB: 03/22/1966 Today's Date: 09/02/2022  History of Present Illness  Rita [name] is a 56 y.o. female with medical history significant of hyperlipidemia, Plavix, history of left occipital CVA without residual deficits (2020), tobacco abuse who presented to the emergency department due to right-sided weakness and slurred speech which started yesterday.  She states that she was in her normal state of health when she went to bed on Tuesday evening and on walking up yesterday (Wednesday) morning, she noted right-sided weakness which was worse in right arm.  Symptoms worsened as the day progressed with worsening slurred speech, weakness and numbness.  She states that she has been compliant with her statin and Plavix, though she endorsed missing some doses occasionally.  Patient continues to smoke 3 to 4 cigarettes daily.  She denies headache, chest pain, shortness of breath, nausea, vomiting, abdominal pain   Clinical Impression  Patient with grossly decreased RLE sensation and strength (4/5). Patient does demonstrate mild unsteadiness with standing and ambulation but no loss of balance and does not require use of AD. Patient notes fatigue with ambulating about 200 feet. Patient will benefit from continued skilled physical therapy in hospital and recommended venue below to increase strength, balance, endurance for safe ADLs and gait.        Recommendations for follow up therapy are one component of a multi-disciplinary discharge planning process, led by the attending physician.  Recommendations may be updated based on patient status, additional functional criteria and insurance authorization.  Follow Up Recommendations Outpatient PT      Assistance Recommended at Discharge PRN  Patient can return home with the following  Assistance with cooking/housework;Assist for transportation;Help with stairs or ramp for entrance     Equipment Recommendations None recommended by PT  Recommendations for Other Services       Functional Status Assessment Patient has had a recent decline in their functional status and demonstrates the ability to make significant improvements in function in a reasonable and predictable amount of time.     Precautions / Restrictions Precautions Precautions: Fall Restrictions Weight Bearing Restrictions: No      Mobility  Bed Mobility Overal bed mobility: Modified Independent             General bed mobility comments: HOB elevated.    Transfers Overall transfer level: Needs assistance Equipment used: None Transfers: Sit to/from Stand, Bed to chair/wheelchair/BSC Sit to Stand: Supervision, Modified independent (Device/Increase time)   Step pivot transfers: Modified independent (Device/Increase time), Supervision       General transfer comment: Mildly unsteady in standing. No physical assist needed.    Ambulation/Gait Ambulation/Gait assistance: Modified independent (Device/Increase time), Supervision Gait Distance (Feet): 200 Feet Assistive device: None Gait Pattern/deviations: Step-through pattern, Decreased stride length       General Gait Details: decreased, mild unsteadiness  Stairs            Wheelchair Mobility    Modified Rankin (Stroke Patients Only)       Balance Overall balance assessment: Mild deficits observed, not formally tested                                           Pertinent Vitals/Pain Pain Assessment Pain Assessment: 0-10 Pain Score: 10-Worst pain ever Pain Location: low back Pain Descriptors / Indicators: Grimacing Pain Intervention(s): Limited activity within  patient's tolerance, Monitored during session, Repositioned    Home Living Family/patient expects to be discharged to:: Private residence Living Arrangements: Spouse/significant other Available Help at Discharge: Family;Available  PRN/intermittently Type of Home: House Home Access: Stairs to enter   [employer] of Steps: 1   Home Layout: One level Home Equipment: Cane - single point      Prior Function Prior Level of Function : Independent/Modified Independent             Mobility Comments: Short distance community ambulator without AD. ADLs Comments: Independent. Pt drives.     Hand Dominance   Dominant Hand: Right    Extremity/Trunk Assessment   Upper Extremity Assessment Upper Extremity Assessment: Defer to OT evaluation RUE Deficits / Details: 3+/5 grossly. Poor fine motor control and deficits in gross motor control. Decreased sensation to light touch. Mild limitation in A/ROM but able to reach near full with extended time. RUE Sensation: decreased light touch RUE Coordination: decreased fine motor;decreased gross motor    Lower Extremity Assessment Lower Extremity Assessment: RLE deficits/detail RLE Deficits / Details: Grossly decreased RLE strength to 4/5 throughout RLE Sensation: decreased light touch    Cervical / Trunk Assessment Cervical / Trunk Assessment: Normal  Communication   Communication: No difficulties  Cognition Arousal/Alertness: Awake/alert Behavior During Therapy: WFL for tasks assessed/performed Overall Cognitive Status: Within Functional Limits for tasks assessed                                          General Comments      Exercises     Assessment/Plan    PT Assessment Patient needs continued PT services  PT Problem List Decreased strength;Decreased mobility;Decreased activity tolerance;Decreased balance;Pain       PT Treatment Interventions DME instruction;Therapeutic exercise;Gait training;Balance training;Stair training;Neuromuscular re-education;Functional mobility training;Therapeutic activities;Patient/family education    PT Goals (Current goals can be found in the Care Plan section)  Acute Rehab PT Goals Patient  Stated Goal: return home PT Goal Formulation: With patient Time For Goal Achievement: 09/09/22 Potential to Achieve Goals: Good    Frequency Min 3X/week     Co-evaluation PT/OT/SLP Co-Evaluation/Treatment: Yes Reason for Co-Treatment: To address functional/ADL transfers;Complexity of the patient's impairments (multi-system involvement) PT goals addressed during session: Mobility/safety with mobility;Balance;Strengthening/ROM OT goals addressed during session: ADL's and self-care       AM-PAC PT "6 Clicks" Mobility  Outcome Measure Help needed turning from your back to your side while in a flat bed without using bedrails?: None Help needed moving from lying on your back to sitting on the side of a flat bed without using bedrails?: A Little Help needed moving to and from a bed to a chair (including a wheelchair)?: None Help needed standing up from a chair using your arms (e.g., wheelchair or bedside chair)?: None Help needed to walk in hospital room?: None Help needed climbing 3-5 steps with a railing? : A Little 6 Click Score: 22    End of Session   Activity Tolerance: Patient tolerated treatment well;Patient limited by fatigue Patient left: in bed;with call bell/phone within reach Nurse Communication: Mobility status PT Visit Diagnosis: Unsteadiness on feet (R26.81);Other abnormalities of gait and mobility (R26.89);Other symptoms and signs involving the nervous system (R29.898);Muscle weakness (generalized) (M62.81)    Time: 3086-5784 PT Time Calculation (min) (ACUTE ONLY): 17 min   Charges:   PT Evaluation $PT Eval Low Complexity:  1 Low          10:03 AM, 09/02/22 Mearl Latin PT, DPT Physical Therapist at [facility]

## 2022-09-02 NOTE — Assessment & Plan Note (Signed)
Continue statin Unclear regarding patient's compliance

## 2022-09-02 NOTE — Assessment & Plan Note (Signed)
BMI 35.26 Lifestyle modification

## 2022-09-02 NOTE — Hospital Course (Signed)
56 year old female with a history of Barrett's esophagus, chronic back pain, chronic abdominal pain, COPD, tobacco abuse, stroke, hypertension, depression, hyperlipidemia presenting with right arm weakness.  She noted the right arm weakness when she woke up on the morning of 08/31/2022.  She was normal in her usual state of health when she went to bed on the evening of 08/30/2022.  Throughout the day on 08/31/2022, her right arm weakness continued to progress.  When she woke up on the morning of 09/01/2022, her weakness has continued to worsen with development of some slurred speech and numbness in her right hand.  As result, the patient presented for further evaluation and treatment.  She denies any fevers, chills, headache, chest pain, shortness breath, cough, hemoptysis.  She continues to smoke about 3 to 4 cigarettes/day. When asked about her medications, the patient seems to have poor insight regarding what medication she is actually taking and not taking.  In the emergency department, the patient was afebrile and hemodynamically stable.  WBC 9.6, hemoglobin 13.7, platelets 274,000.  MRI of the brain showed multiple acute infarcts in the high left frontal and left parietal area.  MRI was negative for LVO but was limited for evaluation of the PCAs.  Neurologist at [facility] was consulted and recommended the patient can stay at [facility] and to consult with [facility] neurology

## 2022-09-02 NOTE — Consult Note (Signed)
I connected with  Rita [name] on 09/02/22 by a video enabled telemedicine application and verified that I am speaking with the correct person using two identifiers.   I discussed the limitations of evaluation and management by telemedicine. The patient expressed understanding and agreed to proceed.  Location of patient: [facility] Location of physician: [facility]   Neurology Consultation Reason for Consult: stroke Referring Physician: Dr Rita [name]  CC: stroke  History is obtained from: Patient, chart review  HPI: Rita [name] is a 56 y.o. female with past medical history of hypertension, hyperlipidemia, prior stroke without residual deficits, nicotine use disorder who presented with right-sided weakness and slurred speech.  Patient states she woke up on Wednesday morning 08/31/2022 and noticed that her right arm "was not doing what she wanted to do" and her speech was slurred.  When the symptoms did improve until next day she eventually came to emergency room.  Last known normal: 11 PM on 08/30/2022 Event happened at home No tPA as outside window No thrombectomy as no large vessel occlusion mRS 0  ROS: All other systems reviewed and negative except as noted in the HPI.   Past Medical History:  Diagnosis Date   Arthritis    "qwhere" (02/22/2018)   Barrett's esophagus with esophagitis 03/26/2013   Chronic abdominal pain    Chronic lower back pain    Chronic pancreatitis (HCC)    COPD (chronic obstructive pulmonary disease) (HCC)    Depression    Dyspnea    GERD (gastroesophageal reflux disease)    Heart murmur    Hiatal hernia    High cholesterol    Hypertension    Peptic ulcer    Pneumonia 2000s X 1   Stroke ([place])    Tobacco use 03/26/2013    Family History  Problem Relation Age of Onset   Asthma Mother    Heart failure Mother    Cancer Mother        pancreatic   Diabetes Mother    Hypertension Mother    Stroke Mother    Pancreatic cancer  Mother        deceased   Heart failure Father    Diabetes Father    Colon cancer Neg Hx     Social History:  reports that she has been smoking cigarettes. She started smoking about 39 years ago. She has a 20.50 pack-year smoking history. She has never used smokeless tobacco. She reports that she does not drink alcohol and does not use drugs.   Medications Prior to Admission  Medication Sig Dispense Refill Last Dose   atorvastatin (LIPITOR) 80 MG tablet Take 1 tablet (80 mg total) by mouth daily at 6 PM. 30 tablet 0 Past Week   Buprenorphine HCl-Naloxone HCl 8-2 MG FILM Take 1 Film by mouth 3 (three) times daily.   09/01/2022   clopidogrel (PLAVIX) 75 MG tablet Take 1 tablet (75 mg total) by mouth daily. 30 tablet 3 08/31/2022 at PM   DEXILANT 60 MG capsule TAKE 1 CAPSULE(60 MG) BY MOUTH AT BEDTIME (Patient taking differently: Take 60 mg by mouth daily.) 90 capsule 3 08/31/2022   potassium chloride (MICRO-K) 10 MEQ CR capsule Take 10 mEq by mouth 2 (two) times daily.   Past Week   colchicine 0.6 MG tablet Take 1 tablet (0.6 mg total) by mouth 2 (two) times daily for 7 days. (Patient not taking: Reported on 09/01/2022) 14 tablet 0 Not Taking  Exam: Current vital signs: BP 139/84   Pulse 62   Temp 97.8 F (36.6 C) (Oral)   Resp 20   Ht '5\' 7"'$  (1.702 m)   Wt 102.4 kg   LMP 02/09/2012   SpO2 98%   BMI 35.36 kg/m  Vital signs in last 24 hours: Temp:  [97.8 F (36.6 C)-98.8 F (37.1 C)] 97.8 F (36.6 C) (09/08 0516) Pulse Rate:  [57-86] 62 (09/08 0817) Resp:  [12-21] 20 (09/08 0817) BP: (117-162)/(66-106) 139/84 (09/08 0817) SpO2:  [92 %-98 %] 98 % (09/08 0817) Weight:  [101.5 kg-102.4 kg] 102.4 kg (09/07 2114)   Physical Exam  Constitutional: Appears well-developed and well-nourished.  Psych: Affect appropriate to situation Eyes: No scleral injection Neuro: AOx3, no aphasia, cranial nerves II to XII appear grossly intact, antigravity strength without drift in all 4 extremities  but appears to have some weakness in right upper extremity (limited evaluation on tele) FTN intact, decree sensation to light touch in right upper extremity  NIHSS 1 ( for right sensory loss)  I have reviewed labs in epic and the results pertinent to this consultation are: CBC:  Recent Labs  Lab 09/01/22 2127 09/01/22 2128 09/02/22 0303  WBC 10.5 TEST WILL BE CREDITED 9.4  NEUTROABS 6.7  --   --   HGB 13.9 TEST WILL BE CREDITED 13.5  HCT 41.9 TEST WILL BE CREDITED 41.2  MCV 87.5 TEST WILL BE CREDITED 86.9  PLT 350 TEST WILL BE CREDITED 220    Basic Metabolic Panel:  Lab Results  Component Value Date   NA 141 09/02/2022   K 3.0 (L) 09/02/2022   CO2 27 09/02/2022   GLUCOSE 103 (H) 09/02/2022   BUN 10 09/02/2022   CREATININE 0.78 09/02/2022   CALCIUM 8.8 (L) 09/02/2022   GFRNONAA >60 09/02/2022   GFRAA >60 01/24/2019   Lipid Panel:  Lab Results  Component Value Date   LDLCALC 167 (H) 09/01/2022   HgbA1c:  Lab Results  Component Value Date   HGBA1C 6.2 (H) 09/02/2022   Urine Drug Screen:     Component Value Date/Time   LABOPIA POSITIVE (A) 01/23/2019 0659   COCAINSCRNUR NONE DETECTED 01/23/2019 0659   LABBENZ NONE DETECTED 01/23/2019 0659   AMPHETMU NONE DETECTED 01/23/2019 0659   THCU POSITIVE (A) 01/23/2019 0659   LABBARB NONE DETECTED 01/23/2019 0659    Alcohol Level     Component Value Date/Time   ETH <10 09/01/2022 1605     I have reviewed the images obtained: MRI brain without contrast 09/01/2022:  Multiple acute infarcts in the high left frontal and parietal lobes, potentially watershed.  MRA head without contrast 09/01/2022:Motion limited MRA without definite evidence of emergent proximal large vessel occlusion. Particularly limited evaluation of the PCAs with nondiagnostic evaluation distal to the mid P2 PCAs bilaterally.  CTA head and neck with and without contrast 09/02/2022:  1.Occluded left vertebral artery at its origin. Small right vertebral  artery is occluded at its V3 segment. Both intradural vertebral arteries are largely non-opacified with small/irregular basilar artery. Small but patent PCAs. 2. Occlusion of the proximal left subclavian artery with distal reconstitution. 3. Moderate to severe left cavernous and paraclinoid ICA and moderate right paraclinoid ICA stenosis. 4. Mild bilateral M1 MCA stenosis.    ASSESSMENT/PLAN: 56 year old female with multiple medical comorbidities as noted above who presented with right-sided weakness and slurred speech.  MRI brain showed acute ischemic stroke.   Acute ischemic stroke Carotid stenosis Intracranial stenosis Nicotine use disorder Hypertension  Hyperlipidemia -Etiology: Likely due to intracranial stenosis -Of note, patient has been on aspirin and Plavix in the past and is currently on Plavix. -Patient was also seen in 2020 with similar symptoms and cardiac monitor was recommended at that time which patient did not get -CT chest in 2020 showed focal mass in right lower lobe and repeat imaging was recommended which has not since been performed  Recommendations: -Patient had strokes while on Plavix.  Therefore recommend aspirin 81 mg daily and Brilinta 90 mg twice daily for secondary stroke prevention for 3 months. -Recommend atorvastatin 80 mg daily -Recommend modification of stroke risk factors including smoking cessation counseling.  Patient was tearful and is interested in quitting smoking now -Goal blood pressure: Normotension with SBP 1 20-140.  Please avoid hypotension. -Discussed with interventional neuro Dr Kathi Ludwig.  Recommended transfer for carotid stenting on Monday for outpatient follow-up.  In the past patient has not been compliant with recommendations.  Therefore discussed with Dr. Carles Collet about transferring to [facility] to be seen by Dr. Estanislado Pandy for possible carotid stenting on Monday  Thank you for allowing [place] to participate in the care of this patient. If you  have any further questions, please contact  me or neurohospitalist.   Zeb [facility] neurohospitalist

## 2022-09-02 NOTE — Assessment & Plan Note (Signed)
Continue pantoprazole. °

## 2022-09-02 NOTE — Progress Notes (Signed)
*  PRELIMINARY RESULTS* Echocardiogram 2D Echocardiogram has been performed with  Definity.  Samuel [place] 09/02/2022, 4:52 PM

## 2022-09-03 DIAGNOSIS — E669 Obesity, unspecified: Secondary | ICD-10-CM | POA: Diagnosis not present

## 2022-09-03 DIAGNOSIS — R7302 Impaired glucose tolerance (oral): Secondary | ICD-10-CM

## 2022-09-03 DIAGNOSIS — E876 Hypokalemia: Secondary | ICD-10-CM | POA: Diagnosis not present

## 2022-09-03 LAB — CBC
HCT: 41.2 % (ref 36.0–46.0)
Hemoglobin: 13.8 g/dL (ref 12.0–15.0)
MCH: 29 pg (ref 26.0–34.0)
MCHC: 33.5 g/dL (ref 30.0–36.0)
MCV: 86.6 fL (ref 80.0–100.0)
Platelets: 345 10*3/uL (ref 150–400)
RBC: 4.76 MIL/uL (ref 3.87–5.11)
RDW: 14.3 % (ref 11.5–15.5)
WBC: 9.2 10*3/uL (ref 4.0–10.5)
nRBC: 0 % (ref 0.0–0.2)

## 2022-09-03 MED ORDER — POTASSIUM CHLORIDE CRYS ER 20 MEQ PO TBCR
40.0000 meq | EXTENDED_RELEASE_TABLET | Freq: Once | ORAL | Status: AC
  Administered 2022-09-03: 40 meq via ORAL
  Filled 2022-09-03: qty 2

## 2022-09-03 MED ORDER — POTASSIUM CHLORIDE 10 MEQ/100ML IV SOLN
10.0000 meq | INTRAVENOUS | Status: AC
  Administered 2022-09-03 (×3): 10 meq via INTRAVENOUS
  Filled 2022-09-03 (×3): qty 100

## 2022-09-03 NOTE — Progress Notes (Signed)
[facility] here to transfer pt. To [place] 3[facility]. Pt. Alert and oriented. Pt. Is c/o of heartburn and requesting Dexilant '60mg'$  that she takes at home, but otherwise not in any distress. Cardiac monitoring removed. Pt. Stood and walked to stretcher. Report given to [facility]. Day shift nurse has given report to 3W nurse.

## 2022-09-03 NOTE — Progress Notes (Signed)
PROGRESS NOTE  Rita [name] ZGY:[id number] DOB: Feb 17, 1966 DOA: 09/01/2022 PCP: [facility]  Brief History:  56 year old female with a history of Barrett's esophagus, chronic back pain, chronic abdominal pain, COPD, tobacco abuse, stroke, hypertension, depression, hyperlipidemia presenting with right arm weakness.  She noted the right arm weakness when she woke up on the morning of 08/31/2022.  She was normal in her usual state of health when she went to bed on the evening of 08/30/2022.  Throughout the day on 08/31/2022, her right arm weakness continued to progress.  When she woke up on the morning of 09/01/2022, her weakness has continued to worsen with development of some slurred speech and numbness in her right hand.  As result, the patient presented for further evaluation and treatment.  She denies any fevers, chills, headache, chest pain, shortness breath, cough, hemoptysis.  She continues to smoke about 3 to 4 cigarettes/day. When asked about her medications, the patient seems to have poor insight regarding what medication she is actually taking and not taking.  In the emergency department, the patient was afebrile and hemodynamically stable.  WBC 9.6, hemoglobin 13.7, platelets 274,000.  MRI of the brain showed multiple acute infarcts in the high left frontal and left parietal area.  MRI was negative for LVO but was limited for evaluation of the PCAs.  Neurologist at [facility] was consulted and recommended the patient can stay at [facility] and to consult with [facility]     Assessment and Plan: * Acute ischemic stroke [facility] [address]) -[facility] Consult--discussed with Dr. Winfred Leeds to [facility] for IR (Dr. Kathi Ludwig) to eval for possible intracranial stenting -PT/OT evaluation -Speech therapy eval -CT brain--neg -MRI brain--multiple acute infarcts in the high left frontal and left parietal areas -MRA brain--negative LVO, limited evaluation of PCAs -CTA head and neck--Occluded left  vertebral artery at its origin. Small right vertebral artery is occluded at its V3 segment. Both intradural vertebral arteries are largely non-opacified with small/irregular basilar artery -Echo--pending -LDL--167 -HbA1C--6.2 -Antiplatelet--aspirin& Brillinta  Tobacco use Tobacco cessation discussed  GERD (gastroesophageal reflux disease) Continue pantoprazole  Impaired glucose tolerance A1C--6.2 Lifestyle modification Will need outpt follow up  Lung mass Pt had RLL lung mass in Jan 2020 --lost to follow up 09/02/22 CT chest--shows resolution of RLL mass;  No new concerning nodules or masses  Obesity, Class II, BMI 35-39.9 BMI 35.26 Lifestyle modification  Opioid dependence ([place]) PDMP reviewed --Patient receives buprenorphine No. 90 on a monthly basis -- Resume buprenorphine  Mixed hyperlipidemia Continue statin Unclear regarding patient's compliance  Hypokalemia Replete Magnesium 1.8     Family Communication:   no Family at bedside   Consultants:  neurology   Code Status:  FULL    DVT Prophylaxis:   [place] Lovenox     Procedures: As Listed in Progress Note Above   Antibiotics: None    Subjective: Patient states her right arm weakness is stable.  Denies new deficits.  Patient denies fevers, chills, headache, chest pain, dyspnea, nausea, vomiting, diarrhea, abdominal pain, dysuria, hematuria, hematochezia, and melena.   Objective: Vitals:   09/02/22 2120 09/03/22 0230 09/03/22 0536 09/03/22 1315  BP: (!) 162/77 (!) 142/90 (!) 147/70 128/87  Pulse: 70 75 (!) 58 (!) 58  Resp: '20 18 18 16  '$ Temp: 98.2 F (36.8 C) 98.1 F (36.7 C) (!) 96.9 F (36.1 C) 98.3 F (36.8 C)  TempSrc: Oral Oral Tympanic Oral  SpO2: 100% 98% 100% 98%  Weight:  Height:        Intake/Output Summary (Last 24 hours) at 09/03/2022 1336 Last data filed at 09/02/2022 1825 Gross per 24 hour  Intake 480 ml  Output --  Net 480 ml   Weight change:  Exam:  General:  Pt is  alert, follows commands appropriately, not in acute distress HEENT: No icterus, No thrush, No neck mass, [place]/AT Cardiovascular: RRR, S1/S2, no rubs, no gallops Respiratory: bibasilar rales. No wheeze Abdomen: Soft/+BS, non tender, non distended, no guarding Extremities: No edema, No lymphangitis, No petechiae, No rashes, no synovitis   Data Reviewed: I have personally reviewed following labs and imaging studies Basic Metabolic Panel: Recent Labs  Lab 09/01/22 1550 09/01/22 1605 09/01/22 2128 09/02/22 0303 09/03/22 0517  NA 139 138  --  141 140  K 3.4* 2.9*  --  3.0* 2.7*  CL 102 103  --  104 107  CO2  --  24  --  27 25  GLUCOSE 142* 139*  --  103* 128*  BUN 10 9  --  10 11  CREATININE 0.80 0.91 0.80 0.78 0.91  CALCIUM  --  9.1  --  8.8* 8.9  MG  --   --   --  1.8  --   PHOS  --   --   --  3.3  --    Liver Function Tests: Recent Labs  Lab 09/01/22 1605 09/02/22 0303  AST 20 19  ALT 25 25  ALKPHOS 78 74  BILITOT 0.6 0.6  PROT 7.3 7.1  ALBUMIN 3.9 3.7   No results for input(s): "LIPASE", "AMYLASE" in the last 168 hours. No results for input(s): "AMMONIA" in the last 168 hours. Coagulation Profile: Recent Labs  Lab 09/01/22 1605 09/01/22 2128  INR 0.9 0.9   CBC: Recent Labs  Lab 09/01/22 2015 09/01/22 2127 09/01/22 2128 09/02/22 0303 09/03/22 0517  WBC 9.6 10.5 TEST WILL BE CREDITED 9.4 9.2  NEUTROABS  --  6.7  --   --   --   HGB 13.7 13.9 TEST WILL BE CREDITED 13.5 13.8  HCT 40.0 41.9 TEST WILL BE CREDITED 41.2 41.2  MCV 84.7 87.5 TEST WILL BE CREDITED 86.9 86.6  PLT 374 350 TEST WILL BE CREDITED 362 345   Cardiac Enzymes: No results for input(s): "CKTOTAL", "CKMB", "CKMBINDEX", "TROPONINI" in the last 168 hours. BNP: Invalid input(s): "POCBNP" CBG: Recent Labs  Lab 09/01/22 1526  GLUCAP 154*   HbA1C: Recent Labs    09/02/22 0303  HGBA1C 6.2*   Urine analysis:    Component Value Date/Time   COLORURINE YELLOW 01/20/2019 1430    APPEARANCEUR CLEAR 01/20/2019 1430   LABSPEC 1.013 01/20/2019 1430   PHURINE 8.0 01/20/2019 1430   GLUCOSEU NEGATIVE 01/20/2019 1430   HGBUR NEGATIVE 01/20/2019 1430   BILIRUBINUR NEGATIVE 01/20/2019 1430   KETONESUR 5 (A) 01/20/2019 1430   PROTEINUR NEGATIVE 01/20/2019 1430   UROBILINOGEN 0.2 02/05/2015 2347   NITRITE NEGATIVE 01/20/2019 1430   LEUKOCYTESUR NEGATIVE 01/20/2019 1430   Sepsis Labs: '@LABRCNTIP'$ (procalcitonin:4,lacticidven:4) )No results found for this or any previous visit (from the past 240 hour(s)).   Scheduled Meds:  aspirin EC  81 mg Oral Daily   atorvastatin  80 mg Oral q1800   buprenorphine-naloxone  1 tablet Sublingual Daily   enoxaparin (LOVENOX) injection  50 mg Subcutaneous Q24H   nicotine  14 mg Transdermal Daily   pantoprazole  40 mg Oral Daily   sodium chloride flush  3 mL Intravenous Once  ticagrelor  90 mg Oral BID   Continuous Infusions:  Procedures/Studies: ECHOCARDIOGRAM COMPLETE  Result Date: 09/02/2022    ECHOCARDIOGRAM REPORT   Patient Name:   Rita [name] Date of Exam: 09/02/2022 Medical Rec #:  [id number]     Height:       67.0 in Accession #:    [id number]    Weight:       225.7 lb Date of Birth:  1966-08-09     BSA:          2.129 m Patient Age:    52 years      BP:           128/65 mmHg Patient Gender: F             HR:           64 bpm. Exam Location:  [facility] Procedure: 2D Echo, Color Doppler and Cardiac Doppler Indications:    Stroke I63.9  History:        Patient has prior history of Echocardiogram examinations, most                 recent 01/23/2019. Stroke and COPD; Risk Factors:Dyslipidemia,                 Current Smoker and Hypertension. Lung mass.  Sonographer:    Alvino Chapel RCS Referring Phys: [id number] OLADAPO ADEFESO IMPRESSIONS  1. Left ventricular ejection fraction, by estimation, is 60 to 65%. The left ventricle has normal function. The left ventricle has no regional wall motion abnormalities. There is mild left ventricular  hypertrophy. Left ventricular diastolic parameters are consistent with Grade I diastolic dysfunction (impaired relaxation).  2. Right ventricular systolic function is normal. The right ventricular size is normal. There is normal pulmonary artery systolic pressure. The estimated right ventricular systolic pressure is 24.4 mmHg.  3. The mitral valve is normal in structure. No evidence of mitral valve regurgitation. No evidence of mitral stenosis.  4. The aortic valve is tricuspid. Aortic valve regurgitation is not visualized. Mild aortic valve stenosis.  5. The inferior vena cava is normal in size with greater than 50% respiratory variability, suggesting right atrial pressure of 3 mmHg. FINDINGS  Left Ventricle: Left ventricular ejection fraction, by estimation, is 60 to 65%. The left ventricle has normal function. The left ventricle has no regional wall motion abnormalities. Definity contrast agent was given IV to delineate the left ventricular  endocardial borders. The left ventricular internal cavity size was normal in size. There is mild left ventricular hypertrophy. Left ventricular diastolic parameters are consistent with Grade I diastolic dysfunction (impaired relaxation). Right Ventricle: The right ventricular size is normal. No increase in right ventricular wall thickness. Right ventricular systolic function is normal. There is normal pulmonary artery systolic pressure. The tricuspid regurgitant velocity is 2.18 m/s, and  with an assumed right atrial pressure of 3 mmHg, the estimated right ventricular systolic pressure is 01.0 mmHg. Left Atrium: Left atrial size was normal in size. Right Atrium: Right atrial size was normal in size. Pericardium: There is no evidence of pericardial effusion. Mitral Valve: The mitral valve is normal in structure. No evidence of mitral valve regurgitation. No evidence of mitral valve stenosis. Tricuspid Valve: The tricuspid valve is normal in structure. Tricuspid valve  regurgitation is trivial. Aortic Valve: The aortic valve is tricuspid. Aortic valve regurgitation is not visualized. Mild aortic stenosis is present. Aortic valve mean gradient measures 9.0 mmHg. Aortic valve peak gradient measures 19.9 mmHg. Aortic  valve area, by VTI measures 1.90 cm. Pulmonic Valve: The pulmonic valve was not well visualized. Pulmonic valve regurgitation is trivial. Aorta: The aortic root is normal in size and structure. Venous: The inferior vena cava is normal in size with greater than 50% respiratory variability, suggesting right atrial pressure of 3 mmHg. IAS/Shunts: The interatrial septum was not well visualized.  LEFT VENTRICLE PLAX 2D LVIDd:         3.60 cm   Diastology LVIDs:         2.40 cm   LV e' medial:    6.20 cm/s LV PW:         1.20 cm   LV E/e' medial:  11.6 LV IVS:        1.20 cm   LV e' lateral:   7.72 cm/s LVOT diam:     1.90 cm   LV E/e' lateral: 9.3 LV SV:         77 LV SV Index:   36 LVOT Area:     2.84 cm  RIGHT VENTRICLE RV S prime:     11.30 cm/s TAPSE (M-mode): 2.3 cm LEFT ATRIUM             Index        RIGHT ATRIUM           Index LA diam:        3.60 cm 1.69 cm/m   RA Area:     12.20 cm LA Vol (A2C):   47.2 ml 22.17 ml/m  RA Volume:   24.50 ml  11.51 ml/m LA Vol (A4C):   41.9 ml 19.68 ml/m LA Biplane Vol: 44.7 ml 21.00 ml/m  AORTIC VALVE AV Area (Vmax):    1.60 cm AV Area (Vmean):   1.77 cm AV Area (VTI):     1.90 cm AV Vmax:           223.00 cm/s AV Vmean:          137.000 cm/s AV VTI:            0.407 m AV Peak Grad:      19.9 mmHg AV Mean Grad:      9.0 mmHg LVOT Vmax:         126.00 cm/s LVOT Vmean:        85.400 cm/s LVOT VTI:          0.273 m LVOT/AV VTI ratio: 0.67  AORTA Ao Root diam: 3.00 cm MITRAL VALVE               TRICUSPID VALVE MV Area (PHT): 2.34 cm    TR Peak grad:   19.0 mmHg MV Decel Time: 324 msec    TR Vmax:        218.00 cm/s MV E velocity: 71.80 cm/s MV A velocity: 92.50 cm/s  SHUNTS MV E/A ratio:  0.78        Systemic VTI:  0.27 m                             Systemic Diam: 1.90 cm Oswaldo Milian MD Electronically signed by Oswaldo Milian MD Signature Date/Time: 09/02/2022/5:37:23 PM    Final    CT CHEST WO CONTRAST  Result Date: 09/02/2022 CLINICAL DATA:  History of right lung mass EXAM: CT CHEST WITHOUT CONTRAST TECHNIQUE: Multidetector CT imaging of the chest was performed following the standard protocol without IV contrast. RADIATION DOSE REDUCTION: This exam was  performed according to the departmental dose-optimization program which includes automated exposure control, adjustment of the mA and/or kV according to patient size and/or use of iterative reconstruction technique. COMPARISON:  Chest CT 01/20/2019 FINDINGS: Cardiovascular: Limited without intravenous contrast. Mild aortic atherosclerosis. No aneurysm. Normal cardiac size. No pericardial effusion. Mediastinum/Nodes: Midline trachea. No suspicious thyroid nodule. No suspicious lymph nodes. Esophagus within normal limits. Lungs/Pleura: Punctate calcified and non calcified nodules, not significantly changed and felt consistent with benign finding, no imaging follow-up is recommended. Previously noted right lower lobe masslike consolidation on the exam from 2020 is no longer visualized. No suspicious pulmonary nodules on today's study. No acute airspace disease, pleural effusion, or pneumothorax. Upper Abdomen: Hepatic steatosis Musculoskeletal: No chest wall mass or suspicious bone lesions identified. IMPRESSION: 1. The previously noted right lower lobe masslike consolidation is no longer visualized. No suspicious pulmonary nodules on today's study. 2. Hepatic steatosis Aortic Atherosclerosis (ICD10-I70.0). Electronically Signed   By: Donavan Foil M.D.   On: 09/02/2022 17:29   CT ANGIO HEAD NECK W WO CM  Result Date: 09/02/2022 CLINICAL DATA:  Neuro deficit, acute, stroke suspected acute stroke on MR brain EXAM: CT ANGIOGRAPHY HEAD AND NECK TECHNIQUE: Multidetector CT  imaging of the head and neck was performed using the standard protocol during bolus administration of intravenous contrast. Multiplanar CT image reconstructions and MIPs were obtained to evaluate the vascular anatomy. Carotid stenosis measurements (when applicable) are obtained utilizing NASCET criteria, using the distal internal carotid diameter as the denominator. RADIATION DOSE REDUCTION: This exam was performed according to the departmental dose-optimization program which includes automated exposure control, adjustment of the mA and/or kV according to patient size and/or use of iterative reconstruction technique. CONTRAST:  62m OMNIPAQUE IOHEXOL 350 MG/ML SOLN COMPARISON:  None Available. FINDINGS: CT HEAD FINDINGS Brain: Left frontoparietal infarcts better characterized on recent MRI. No evidence of progressive mass effect or acute hemorrhage. Multiple remote infarcts including in the cerebellum, left occipital lobe and left corona radiata. No evidence of hydrocephalus or mass lesion. Vascular: See below. Skull: No acute fracture. Sinuses/Orbits: Clear sinuses. Other: No mastoid effusions. Review of the MIP images confirms the above findings CTA NECK FINDINGS Aortic arch: Occlusion of the proximal left subclavian artery with distal reconstitution. Right carotid system: Atherosclerosis at the carotid bifurcation involving the proximal ICA without greater than 50% stenosis. Left carotid system: No greater than 50% stenosis. Atherosclerosis at the carotid bifurcation involving the proximal ICA. Vertebral arteries: Left vertebral artery is occluded at its origin with 4/irregular distal V2 reconstitution. Right vertebral artery is small, but patent proximally with occlusion at the V3 segment. Skeleton: No acute findings. Other neck: No acute findings. Upper chest: Visualized lung apices are clear. Review of the MIP images confirms the above findings CTA HEAD FINDINGS Anterior circulation: Bilateral intracranial  ICAs are patent with areas of atherosclerotic narrowing, including moderate to severe left cavernous and paraclinoid and moderate right paraclinoid ICA stenosis. Mild high bilateral M1 MCA stenosis. Bilateral M1 and proximal M2 MCA vessels are patent. Bilateral ACAs are patent without proximal hemodynamically significant stenosis. Posterior circulation: Both intradural vertebral arteries are largely non-opacified with small/irregular basilar artery. The PCAs are small bilaterally but appear patent. Venous sinuses: As permitted by contrast timing, patent. Review of the MIP images confirms the above findings IMPRESSION: 1. Occluded left vertebral artery at its origin. Small right vertebral artery is occluded at its V3 segment. Both intradural vertebral arteries are largely non-opacified with small/irregular basilar artery. Small but patent PCAs. 2.  Occlusion of the proximal left subclavian artery with distal reconstitution. 3. Moderate to severe left cavernous and paraclinoid ICA and moderate right paraclinoid ICA stenosis. 4. Mild bilateral M1 MCA stenosis. These results will be called to the ordering clinician or representative by the Radiologist Assistant, and communication documented in the PACS or [employer]. Electronically Signed   By: Margaretha Sheffield M.D.   On: 09/02/2022 10:27   MR Brain W and Wo Contrast  Result Date: 09/01/2022 CLINICAL DATA:  Neuro deficit, acute, stroke suspected EXAM: MRI HEAD WITHOUT AND WITH CONTRAST MRA HEAD WITHOUT CONTRAST TECHNIQUE: Multiplanar, multiecho pulse sequences of the brain and surrounding structures were obtained without and with intravenous contrast. Angiographic images of the Circle of Willis were obtained using MRA technique without intravenous contrast. CONTRAST:  68m GADAVIST GADOBUTROL 1 MMOL/ML IV SOLN COMPARISON:  MRI head 08/11/2021.  MRA 01/21/2019. FINDINGS: MRI HEAD FINDINGS Brain: Multiple acute infarcts in the high left frontal and parietal  lobes, potentially watershed and/or left MCA territory. Many other remote infarcts, including in the corona radiata and cerebellum bilaterally. No evidence of acute hemorrhage, mass lesion, midline shift or hydrocephalus. Mild enhancement in the region of the infarct, probably enhance infarcts. Otherwise, no pathologic enhancement. Vascular: Detailed below. Skull and upper cervical spine: Normal marrow signal. Sinuses/Orbits: Mild paranasal sinus mucosal thickening. No acute orbital findings. Other: No mastoid effusions. MRA HEAD FINDINGS Motion limited study. Anterior circulation: Limited assessment due to motion with bilateral intracranial ICAs, MCAs and ACAs grossly patent without visible proximal emergent large vessel occlusion. Posterior circulation: Limited study due to patient motion with the visualized basilar artery appearing patent. Only the distal most intradural vertebral arteries are imaged, which appear grossly patent. Poor assessment of the posterior cerebral arteries bilaterally with flow related signal in the P1 and proximal P2 segments and nondiagnostic evaluation more distally. Chronically small vertebrobasilar system. IMPRESSION: 1. Multiple acute infarcts in the high left frontal and parietal lobes, potentially watershed. 2. Motion limited MRA without definite evidence of emergent proximal large vessel occlusion. 3. Particularly limited evaluation of the PCAs with nondiagnostic evaluation distal to the mid P2 PCAs bilaterally. A CTA could further characterize if clinically warranted. Electronically Signed   By: FMargaretha SheffieldM.D.   On: 09/01/2022 18:44   MR ANGIO HEAD WO CONTRAST  Result Date: 09/01/2022 CLINICAL DATA:  Neuro deficit, acute, stroke suspected EXAM: MRI HEAD WITHOUT AND WITH CONTRAST MRA HEAD WITHOUT CONTRAST TECHNIQUE: Multiplanar, multiecho pulse sequences of the brain and surrounding structures were obtained without and with intravenous contrast. Angiographic images of  the Circle of Willis were obtained using MRA technique without intravenous contrast. CONTRAST:  168mGADAVIST GADOBUTROL 1 MMOL/ML IV SOLN COMPARISON:  MRI head 08/11/2021.  MRA 01/21/2019. FINDINGS: MRI HEAD FINDINGS Brain: Multiple acute infarcts in the high left frontal and parietal lobes, potentially watershed and/or left MCA territory. Many other remote infarcts, including in the corona radiata and cerebellum bilaterally. No evidence of acute hemorrhage, mass lesion, midline shift or hydrocephalus. Mild enhancement in the region of the infarct, probably enhance infarcts. Otherwise, no pathologic enhancement. Vascular: Detailed below. Skull and upper cervical spine: Normal marrow signal. Sinuses/Orbits: Mild paranasal sinus mucosal thickening. No acute orbital findings. Other: No mastoid effusions. MRA HEAD FINDINGS Motion limited study. Anterior circulation: Limited assessment due to motion with bilateral intracranial ICAs, MCAs and ACAs grossly patent without visible proximal emergent large vessel occlusion. Posterior circulation: Limited study due to patient motion with the visualized basilar artery appearing patent. Only the  distal most intradural vertebral arteries are imaged, which appear grossly patent. Poor assessment of the posterior cerebral arteries bilaterally with flow related signal in the P1 and proximal P2 segments and nondiagnostic evaluation more distally. Chronically small vertebrobasilar system. IMPRESSION: 1. Multiple acute infarcts in the high left frontal and parietal lobes, potentially watershed. 2. Motion limited MRA without definite evidence of emergent proximal large vessel occlusion. 3. Particularly limited evaluation of the PCAs with nondiagnostic evaluation distal to the mid P2 PCAs bilaterally. A CTA could further characterize if clinically warranted. Electronically Signed   By: Margaretha Sheffield M.D.   On: 09/01/2022 18:44   CT HEAD WO CONTRAST  Result Date: 09/01/2022 CLINICAL  DATA:  Headache, slurred speech, right-sided weakness. Symptoms began yesterday. EXAM: CT HEAD WITHOUT CONTRAST TECHNIQUE: Contiguous axial images were obtained from the base of the skull through the vertex without intravenous contrast. RADIATION DOSE REDUCTION: This exam was performed according to the departmental dose-optimization program which includes automated exposure control, adjustment of the mA and/or kV according to patient size and/or use of iterative reconstruction technique. COMPARISON:  CT head 08/11/2021 FINDINGS: Brain: No evidence of acute infarction, hemorrhage, hydrocephalus, extra-axial collection or mass lesion/mass effect. Chronic infarcts noted in the bilateral cerebellum, left occipital lobe, and left corona radiata. Vascular: No hyperdense vessel or unexpected calcification. Skull: Normal. Negative for fracture or focal lesion. Sinuses/Orbits: No acute finding. Trace mucosal thickening in the maxillary sinuses, left sphenoid sinus, and left frontal sinus as well as mild-to-moderate mucosal thickening in scattered bilateral anterior ethmoid air cells. Mastoid air cells are clear. Other: None. IMPRESSION: No acute intracranial abnormality. Chronic infarcts in the bilateral cerebellum, left occipital lobe, and left corona radiata. Electronically Signed   By: Ileana Roup M.D.   On: 09/01/2022 17:58    Orson Eva, DO  [facility]  If 7PM-7AM, please contact night-coverage [email or web address] Password [email or web address] 09/03/2022, 1:36 PM   LOS: 1 day

## 2022-09-04 DIAGNOSIS — E669 Obesity, unspecified: Secondary | ICD-10-CM | POA: Diagnosis not present

## 2022-09-04 DIAGNOSIS — E876 Hypokalemia: Secondary | ICD-10-CM | POA: Diagnosis not present

## 2022-09-04 DIAGNOSIS — R918 Other nonspecific abnormal finding of lung field: Secondary | ICD-10-CM | POA: Diagnosis not present

## 2022-09-04 DIAGNOSIS — R7302 Impaired glucose tolerance (oral): Secondary | ICD-10-CM | POA: Diagnosis not present

## 2022-09-04 LAB — LACTIC ACID, PLASMA: Lactic Acid, Venous: 1.7 mmol/L (ref 0.5–1.9)

## 2022-09-04 LAB — BASIC METABOLIC PANEL
Anion gap: 11 (ref 5–15)
BUN: 5 mg/dL — ABNORMAL LOW (ref 6–20)
CO2: 26 mmol/L (ref 22–32)
Calcium: 8.9 mg/dL (ref 8.9–10.3)
Chloride: 103 mmol/L (ref 98–111)
Creatinine, Ser: 0.94 mg/dL (ref 0.44–1.00)
GFR, Estimated: >60 mL/min (ref >60)
Glucose, Bld: 131 mg/dL — ABNORMAL HIGH (ref 70–99)
Potassium: 3.1 mmol/L — ABNORMAL LOW (ref 3.5–5.1)
Sodium: 140 mmol/L (ref 135–145)

## 2022-09-04 LAB — LACTATE DEHYDROGENASE: LDH: 139 U/L (ref 98–192)

## 2022-09-04 LAB — MAGNESIUM: Magnesium: 1.9 mg/dL (ref 1.7–2.4)

## 2022-09-04 MED ORDER — SODIUM CHLORIDE 0.9 % IV SOLN: INTRAVENOUS | Status: AC

## 2022-09-04 MED ORDER — MORPHINE SULFATE (PF) 2 MG/ML IV SOLN
4.0000 mg | INTRAVENOUS | Status: DC | PRN
  Administered 2022-09-04 – 2022-09-09 (×17): 4 mg via INTRAVENOUS
  Filled 2022-09-04 (×19): qty 2

## 2022-09-04 MED ORDER — POTASSIUM CHLORIDE 20 MEQ PO PACK
40.0000 meq | PACK | Freq: Two times a day (BID) | ORAL | Status: AC
  Administered 2022-09-04 (×2): 40 meq via ORAL
  Filled 2022-09-04 (×2): qty 2

## 2022-09-04 NOTE — Progress Notes (Signed)
[facility] PROGRESS NOTE    Progress Note  Rita [name]  FAO:[id number] DOB: 05-Nov-1966 DOA: 09/01/2022 PCP: [facility]     Brief Narrative:   Rita [name] is an 56 y.o. female past medical history of chronic back pain, chronic abdominal pain, COPD, stroke, essential hypertension hyperlipidemia presents with right arm weakness when she woke up on 08/31/2022 in the ED she was found to be afebrile hemoglobin of 13 white blood cell count of 9 MRI of the brain showed multiple acute infarcts in the frontal and parietal lobe, MRI of the head was negative for large vessel occlusion neurologist was consulted recommended transfer to [facility]   Assessment/Plan:   Acute ischemic stroke multiple acute infarcts in the left frontal and parietal skull: CTA of the head showed occluded left vertebral artery occlusion of the left subclavian artery with distal reconstitution and moderate paraclinoid ICA and right paraclinoid stenosis Neurology was consulted recommended aspirin and Brilinta twice a day for 3 months. He also recommended high-dose statins. IR was consulted recommended transfer to [facility] for carotid stenting on Monday.  New acute abdominal pain: The patient was not able to tolerate her diet complaining of nausea. Abdominal exam is benign no rebound or guarding. Her pain seems out of proportion to physical exam. Get a CT angio to check an LDH and a lactate. Start IV fluid hydration try to help clear the contrast.  Tobacco abuse: Discussed cessation.  GERD: Continue Protonix.  Impaired glucose intolerance: With an A1c of 6.2 lifestyle modifications and follow-up with PCP.  Right lobe lung mass: Diagnosed in January 2020 lost to follow-up repeated CT scan showed resolution of left lower lobe.  Obesity: Lifestyle modification.  Opioid dependence: Continue buprenorphine.  Mixed hyperlipidemia Continue statins.  Hypokalemia: Replete orally continues to be  low continue replete orally magnesium 1.9.  DVT prophylaxis: lovenox Family Communication:none Status is: Inpatient Remains inpatient appropriate because: Acute CVA    Code Status:     Code Status Orders  (From admission, onward)           Start     Ordered   09/01/22 2043  Full code  Continuous        09/01/22 2043           Code Status History     Date Active Date Inactive Code Status Order ID Comments User Context   01/20/2019 1751 01/24/2019 1914 Full Code [id number]  Bethena Roys, MD ED   03/19/2018 1513 03/21/2018 1419 Full Code [id number]  Ulyses Amor, PA-C Inpatient   02/21/2018 2200 02/26/2018 2113 Full Code [id number]  Vianne Bulls, MD ED   01/19/2018 1312 01/19/2018 1954 Full Code [id number]  Elam Dutch, MD Inpatient   12/30/2016 2336 01/02/2017 2012 Full Code [id number]  Orvan Falconer, MD Inpatient   02/06/2015 0320 02/08/2015 1942 Full Code [id number]  Oswald Hillock, MD Inpatient   03/25/2013 1647 03/29/2013 1615 Full Code [id number]  Doree Albee, MD ED         IV Access:   Peripheral IV   Procedures and diagnostic studies:   ECHOCARDIOGRAM COMPLETE  Result Date: 09/02/2022    ECHOCARDIOGRAM REPORT   Patient Name:   Rita [name] Date of Exam: 09/02/2022 Medical Rec #:  [id number]     Height:       67.0 in Accession #:    [id number]    Weight:       225.7 lb Date of Birth:  Apr 07, 1966     BSA:          2.129 m Patient Age:    13 years      BP:           128/65 mmHg Patient Gender: F             HR:           64 bpm. Exam Location:  [facility] Procedure: 2D Echo, Color Doppler and Cardiac Doppler Indications:    Stroke I63.9  History:        Patient has prior history of Echocardiogram examinations, most                 recent 01/23/2019. Stroke and COPD; Risk Factors:Dyslipidemia,                 Current Smoker and Hypertension. Lung mass.  Sonographer:    Alvino Chapel RCS Referring Phys: [id number] OLADAPO ADEFESO IMPRESSIONS  1. Left ventricular ejection  fraction, by estimation, is 60 to 65%. The left ventricle has normal function. The left ventricle has no regional wall motion abnormalities. There is mild left ventricular hypertrophy. Left ventricular diastolic parameters are consistent with Grade I diastolic dysfunction (impaired relaxation).  2. Right ventricular systolic function is normal. The right ventricular size is normal. There is normal pulmonary artery systolic pressure. The estimated right ventricular systolic pressure is 20.2 mmHg.  3. The mitral valve is normal in structure. No evidence of mitral valve regurgitation. No evidence of mitral stenosis.  4. The aortic valve is tricuspid. Aortic valve regurgitation is not visualized. Mild aortic valve stenosis.  5. The inferior vena cava is normal in size with greater than 50% respiratory variability, suggesting right atrial pressure of 3 mmHg. FINDINGS  Left Ventricle: Left ventricular ejection fraction, by estimation, is 60 to 65%. The left ventricle has normal function. The left ventricle has no regional wall motion abnormalities. Definity contrast agent was given IV to delineate the left ventricular  endocardial borders. The left ventricular internal cavity size was normal in size. There is mild left ventricular hypertrophy. Left ventricular diastolic parameters are consistent with Grade I diastolic dysfunction (impaired relaxation). Right Ventricle: The right ventricular size is normal. No increase in right ventricular wall thickness. Right ventricular systolic function is normal. There is normal pulmonary artery systolic pressure. The tricuspid regurgitant velocity is 2.18 m/s, and  with an assumed right atrial pressure of 3 mmHg, the estimated right ventricular systolic pressure is 54.2 mmHg. Left Atrium: Left atrial size was normal in size. Right Atrium: Right atrial size was normal in size. Pericardium: There is no evidence of pericardial effusion. Mitral Valve: The mitral valve is normal in  structure. No evidence of mitral valve regurgitation. No evidence of mitral valve stenosis. Tricuspid Valve: The tricuspid valve is normal in structure. Tricuspid valve regurgitation is trivial. Aortic Valve: The aortic valve is tricuspid. Aortic valve regurgitation is not visualized. Mild aortic stenosis is present. Aortic valve mean gradient measures 9.0 mmHg. Aortic valve peak gradient measures 19.9 mmHg. Aortic valve area, by VTI measures 1.90 cm. Pulmonic Valve: The pulmonic valve was not well visualized. Pulmonic valve regurgitation is trivial. Aorta: The aortic root is normal in size and structure. Venous: The inferior vena cava is normal in size with greater than 50% respiratory variability, suggesting right atrial pressure of 3 mmHg. IAS/Shunts: The interatrial septum was not well visualized.  LEFT VENTRICLE PLAX 2D LVIDd:         3.60  cm   Diastology LVIDs:         2.40 cm   LV e' medial:    6.20 cm/s LV PW:         1.20 cm   LV E/e' medial:  11.6 LV IVS:        1.20 cm   LV e' lateral:   7.72 cm/s LVOT diam:     1.90 cm   LV E/e' lateral: 9.3 LV SV:         77 LV SV Index:   36 LVOT Area:     2.84 cm  RIGHT VENTRICLE RV S prime:     11.30 cm/s TAPSE (M-mode): 2.3 cm LEFT ATRIUM             Index        RIGHT ATRIUM           Index LA diam:        3.60 cm 1.69 cm/m   RA Area:     12.20 cm LA Vol (A2C):   47.2 ml 22.17 ml/m  RA Volume:   24.50 ml  11.51 ml/m LA Vol (A4C):   41.9 ml 19.68 ml/m LA Biplane Vol: 44.7 ml 21.00 ml/m  AORTIC VALVE AV Area (Vmax):    1.60 cm AV Area (Vmean):   1.77 cm AV Area (VTI):     1.90 cm AV Vmax:           223.00 cm/s AV Vmean:          137.000 cm/s AV VTI:            0.407 m AV Peak Grad:      19.9 mmHg AV Mean Grad:      9.0 mmHg LVOT Vmax:         126.00 cm/s LVOT Vmean:        85.400 cm/s LVOT VTI:          0.273 m LVOT/AV VTI ratio: 0.67  AORTA Ao Root diam: 3.00 cm MITRAL VALVE               TRICUSPID VALVE MV Area (PHT): 2.34 cm    TR Peak grad:   19.0  mmHg MV Decel Time: 324 msec    TR Vmax:        218.00 cm/s MV E velocity: 71.80 cm/s MV A velocity: 92.50 cm/s  SHUNTS MV E/A ratio:  0.78        Systemic VTI:  0.27 m                            Systemic Diam: 1.90 cm Oswaldo Milian MD Electronically signed by Oswaldo Milian MD Signature Date/Time: 09/02/2022/5:37:23 PM    Final    CT CHEST WO CONTRAST  Result Date: 09/02/2022 CLINICAL DATA:  History of right lung mass EXAM: CT CHEST WITHOUT CONTRAST TECHNIQUE: Multidetector CT imaging of the chest was performed following the standard protocol without IV contrast. RADIATION DOSE REDUCTION: This exam was performed according to the departmental dose-optimization program which includes automated exposure control, adjustment of the mA and/or kV according to patient size and/or use of iterative reconstruction technique. COMPARISON:  Chest CT 01/20/2019 FINDINGS: Cardiovascular: Limited without intravenous contrast. Mild aortic atherosclerosis. No aneurysm. Normal cardiac size. No pericardial effusion. Mediastinum/Nodes: Midline trachea. No suspicious thyroid nodule. No suspicious lymph nodes. Esophagus within normal limits. Lungs/Pleura: Punctate calcified and non calcified nodules, not significantly changed and  felt consistent with benign finding, no imaging follow-up is recommended. Previously noted right lower lobe masslike consolidation on the exam from 2020 is no longer visualized. No suspicious pulmonary nodules on today's study. No acute airspace disease, pleural effusion, or pneumothorax. Upper Abdomen: Hepatic steatosis Musculoskeletal: No chest wall mass or suspicious bone lesions identified. IMPRESSION: 1. The previously noted right lower lobe masslike consolidation is no longer visualized. No suspicious pulmonary nodules on today's study. 2. Hepatic steatosis Aortic Atherosclerosis (ICD10-I70.0). Electronically Signed   By: Donavan Foil M.D.   On: 09/02/2022 17:29   CT ANGIO HEAD NECK W WO  CM  Result Date: 09/02/2022 CLINICAL DATA:  Neuro deficit, acute, stroke suspected acute stroke on MR brain EXAM: CT ANGIOGRAPHY HEAD AND NECK TECHNIQUE: Multidetector CT imaging of the head and neck was performed using the standard protocol during bolus administration of intravenous contrast. Multiplanar CT image reconstructions and MIPs were obtained to evaluate the vascular anatomy. Carotid stenosis measurements (when applicable) are obtained utilizing NASCET criteria, using the distal internal carotid diameter as the denominator. RADIATION DOSE REDUCTION: This exam was performed according to the departmental dose-optimization program which includes automated exposure control, adjustment of the mA and/or kV according to patient size and/or use of iterative reconstruction technique. CONTRAST:  27m OMNIPAQUE IOHEXOL 350 MG/ML SOLN COMPARISON:  None Available. FINDINGS: CT HEAD FINDINGS Brain: Left frontoparietal infarcts better characterized on recent MRI. No evidence of progressive mass effect or acute hemorrhage. Multiple remote infarcts including in the cerebellum, left occipital lobe and left corona radiata. No evidence of hydrocephalus or mass lesion. Vascular: See below. Skull: No acute fracture. Sinuses/Orbits: Clear sinuses. Other: No mastoid effusions. Review of the MIP images confirms the above findings CTA NECK FINDINGS Aortic arch: Occlusion of the proximal left subclavian artery with distal reconstitution. Right carotid system: Atherosclerosis at the carotid bifurcation involving the proximal ICA without greater than 50% stenosis. Left carotid system: No greater than 50% stenosis. Atherosclerosis at the carotid bifurcation involving the proximal ICA. Vertebral arteries: Left vertebral artery is occluded at its origin with 4/irregular distal V2 reconstitution. Right vertebral artery is small, but patent proximally with occlusion at the V3 segment. Skeleton: No acute findings. Other neck: No acute  findings. Upper chest: Visualized lung apices are clear. Review of the MIP images confirms the above findings CTA HEAD FINDINGS Anterior circulation: Bilateral intracranial ICAs are patent with areas of atherosclerotic narrowing, including moderate to severe left cavernous and paraclinoid and moderate right paraclinoid ICA stenosis. Mild high bilateral M1 MCA stenosis. Bilateral M1 and proximal M2 MCA vessels are patent. Bilateral ACAs are patent without proximal hemodynamically significant stenosis. Posterior circulation: Both intradural vertebral arteries are largely non-opacified with small/irregular basilar artery. The PCAs are small bilaterally but appear patent. Venous sinuses: As permitted by contrast timing, patent. Review of the MIP images confirms the above findings IMPRESSION: 1. Occluded left vertebral artery at its origin. Small right vertebral artery is occluded at its V3 segment. Both intradural vertebral arteries are largely non-opacified with small/irregular basilar artery. Small but patent PCAs. 2. Occlusion of the proximal left subclavian artery with distal reconstitution. 3. Moderate to severe left cavernous and paraclinoid ICA and moderate right paraclinoid ICA stenosis. 4. Mild bilateral M1 MCA stenosis. These results will be called to the ordering clinician or representative by the Radiologist Assistant, and communication documented in the PACS or [employer] Electronically Signed   By: FMargaretha SheffieldM.D.   On: 09/02/2022 10:27     Medical Consultants:  None.   Subjective:    Rita [name] she is complaining of new abdominal pain did not tolerate her diet this morning.  Objective:    Vitals:   09/03/22 2131 09/03/22 2317 09/04/22 0400 09/04/22 0822  BP: 134/75 (!) 153/82 133/78 (!) 151/79  Pulse: 64 (!) 59 (!) 59 67  Resp: '18 16 16 12  '$ Temp: 98.2 F (36.8 C) 98.4 F (36.9 C) 97.7 F (36.5 C) 97.7 F (36.5 C)  TempSrc:  Oral Oral Oral  SpO2: 100% 98% 98%    Weight:      Height:       SpO2: 98 %   Intake/Output Summary (Last 24 hours) at 09/04/2022 0844 Last data filed at 09/03/2022 1700 Gross per 24 hour  Intake 1161.61 ml  Output --  Net 1161.61 ml   Filed Weights   09/01/22 1523 09/01/22 2114  Weight: 101.5 kg 102.4 kg    Exam: General exam: In no acute distress. Respiratory system: Good air movement and clear to auscultation. Cardiovascular system: S1 & S2 heard, RRR. No JVD. Gastrointestinal system: Positive bowel sounds soft nondistended no rebound or guarding. Extremities: No pedal edema. Skin: No rashes, lesions or ulcers Psychiatry: Judgement and insight appear normal. Mood & affect appropriate.    Data Reviewed:    Labs: Basic Metabolic Panel: Recent Labs  Lab 09/01/22 1550 09/01/22 1605 09/01/22 2128 09/02/22 0303 09/03/22 0517 09/04/22 0208  NA 139 138  --  141 140 140  K 3.4* 2.9*  --  3.0* 2.7* 3.1*  CL 102 103  --  104 107 103  CO2  --  24  --  '27 25 26  '$ GLUCOSE 142* 139*  --  103* 128* 131*  BUN 10 9  --  10 11 5*  CREATININE 0.80 0.91 0.80 0.78 0.91 0.94  CALCIUM  --  9.1  --  8.8* 8.9 8.9  MG  --   --   --  1.8  --  1.9  PHOS  --   --   --  3.3  --   --    GFR Estimated Creatinine Clearance: 82.2 mL/min (by C-G formula based on SCr of 0.94 mg/dL). Liver Function Tests: Recent Labs  Lab 09/01/22 1605 09/02/22 0303  AST 20 19  ALT 25 25  ALKPHOS 78 74  BILITOT 0.6 0.6  PROT 7.3 7.1  ALBUMIN 3.9 3.7   No results for input(s): "LIPASE", "AMYLASE" in the last 168 hours. No results for input(s): "AMMONIA" in the last 168 hours. Coagulation profile Recent Labs  Lab 09/01/22 1605 09/01/22 2128  INR 0.9 0.9   COVID-19 Labs  No results for input(s): "DDIMER", "FERRITIN", "LDH", "CRP" in the last 72 hours.  No results found for: "SARSCOV2NAA"  CBC: Recent Labs  Lab 09/01/22 2015 09/01/22 2127 09/01/22 2128 09/02/22 0303 09/03/22 0517  WBC 9.6 10.5 TEST WILL BE CREDITED 9.4 9.2   NEUTROABS  --  6.7  --   --   --   HGB 13.7 13.9 TEST WILL BE CREDITED 13.5 13.8  HCT 40.0 41.9 TEST WILL BE CREDITED 41.2 41.2  MCV 84.7 87.5 TEST WILL BE CREDITED 86.9 86.6  PLT 374 350 TEST WILL BE CREDITED 362 345   Cardiac Enzymes: No results for input(s): "CKTOTAL", "CKMB", "CKMBINDEX", "TROPONINI" in the last 168 hours. BNP (last 3 results) No results for input(s): "PROBNP" in the last 8760 hours. CBG: Recent Labs  Lab 09/01/22 1526  GLUCAP 154*   D-Dimer: No  results for input(s): "DDIMER" in the last 72 hours. Hgb A1c: Recent Labs    09/02/22 0303  HGBA1C 6.2*   Lipid Profile: Recent Labs    09/01/22 2128  CHOL 264*  HDL 38*  LDLCALC 167*  TRIG 293*  CHOLHDL 6.9   Thyroid function studies: No results for input(s): "TSH", "T4TOTAL", "T3FREE", "THYROIDAB" in the last 72 hours.  Invalid input(s): "FREET3" Anemia work up: No results for input(s): "VITAMINB12", "FOLATE", "FERRITIN", "TIBC", "IRON", "RETICCTPCT" in the last 72 hours. Sepsis Labs: Recent Labs  Lab 09/01/22 2127 09/01/22 2128 09/02/22 0303 09/03/22 0517  WBC 10.5 TEST WILL BE CREDITED 9.4 9.2   Microbiology No results found for this or any previous visit (from the past 240 hour(s)).   Medications:    aspirin EC  81 mg Oral Daily   atorvastatin  80 mg Oral q1800   buprenorphine-naloxone  1 tablet Sublingual Daily   enoxaparin (LOVENOX) injection  50 mg Subcutaneous Q24H   nicotine  14 mg Transdermal Daily   pantoprazole  40 mg Oral Daily   sodium chloride flush  3 mL Intravenous Once   ticagrelor  90 mg Oral BID   Continuous Infusions:    LOS: 2 days   Charlynne Cousins  [facility]  09/04/2022, 8:44 AM

## 2022-09-05 DIAGNOSIS — E669 Obesity, unspecified: Secondary | ICD-10-CM | POA: Diagnosis not present

## 2022-09-05 DIAGNOSIS — R7302 Impaired glucose tolerance (oral): Secondary | ICD-10-CM | POA: Diagnosis not present

## 2022-09-05 DIAGNOSIS — E876 Hypokalemia: Secondary | ICD-10-CM | POA: Diagnosis not present

## 2022-09-05 DIAGNOSIS — R918 Other nonspecific abnormal finding of lung field: Secondary | ICD-10-CM | POA: Diagnosis not present

## 2022-09-05 LAB — HEPATIC FUNCTION PANEL
ALT: 25 U/L (ref 0–44)
AST: 19 U/L (ref 15–41)
Albumin: 3.4 g/dL — ABNORMAL LOW (ref 3.5–5.0)
Alkaline Phosphatase: 72 U/L (ref 38–126)
Bilirubin, Direct: <0.1 mg/dL (ref 0.0–0.2)
Total Bilirubin: 0.4 mg/dL (ref 0.3–1.2)
Total Protein: 6.3 g/dL — ABNORMAL LOW (ref 6.5–8.1)

## 2022-09-05 LAB — BASIC METABOLIC PANEL
Anion gap: 12 (ref 5–15)
BUN: 8 mg/dL (ref 6–20)
CO2: 17 mmol/L — ABNORMAL LOW (ref 22–32)
Calcium: 8.7 mg/dL — ABNORMAL LOW (ref 8.9–10.3)
Chloride: 110 mmol/L (ref 98–111)
Creatinine, Ser: 0.92 mg/dL (ref 0.44–1.00)
GFR, Estimated: >60 mL/min (ref >60)
Glucose, Bld: 112 mg/dL — ABNORMAL HIGH (ref 70–99)
Potassium: 3.6 mmol/L (ref 3.5–5.1)
Sodium: 139 mmol/L (ref 135–145)

## 2022-09-05 MED ORDER — POTASSIUM CHLORIDE 20 MEQ PO PACK
40.0000 meq | PACK | Freq: Two times a day (BID) | ORAL | Status: AC
  Administered 2022-09-05: 40 meq via ORAL
  Filled 2022-09-05 (×2): qty 2

## 2022-09-05 MED ORDER — PANTOPRAZOLE SODIUM 40 MG PO TBEC
40.0000 mg | DELAYED_RELEASE_TABLET | Freq: Two times a day (BID) | ORAL | Status: DC
  Administered 2022-09-05 – 2022-09-09 (×7): 40 mg via ORAL
  Filled 2022-09-05 (×7): qty 1

## 2022-09-05 MED ORDER — PANTOPRAZOLE SODIUM 40 MG IV SOLR
40.0000 mg | Freq: Two times a day (BID) | INTRAVENOUS | Status: DC
  Administered 2022-09-05: 40 mg via INTRAVENOUS
  Filled 2022-09-05: qty 10

## 2022-09-05 MED ORDER — PROTAMINE SULFATE 10 MG/ML IV SOLN: 50.0000 mg | Freq: Once | INTRAVENOUS | Status: DC

## 2022-09-05 MED ORDER — PROCHLORPERAZINE EDISYLATE 10 MG/2ML IJ SOLN
5.0000 mg | INTRAMUSCULAR | Status: AC
  Administered 2022-09-05: 5 mg via INTRAVENOUS
  Filled 2022-09-05: qty 2

## 2022-09-05 NOTE — Progress Notes (Signed)
Occupational Therapy Treatment Patient Details Name: Rita [name] MRN: [id number] DOB: May 23, 1966 Today's Date: 09/05/2022   History of present illness Pt is a 56 y/o female who presented to the ED 09/01/22 with right-sided weakness and slurred speech. MRI revealed multiple acute infarcts in the high L frontal and parietal lobes, potentially watershed. PMH significant of hyperlipidemia, Plavix, history of left occipital CVA without residual deficits (2020), tobacco abuse.   OT comments  Pt supine in bed and agreeable to OT session.  Reports increased visual loss on R side, assessment reveals lower quadrant deficit.  Pt appears to be compensating well, reviewed techniques and provided handout. Completed exercises to R UE (see below), provided red built up handle, squeeze ball, and theraputty.  Progressing well.  Continue to recommend Outpatient OT (reports having a ride), will follow acutely.    Recommendations for follow up therapy are one component of a multi-disciplinary discharge planning process, led by the attending physician.  Recommendations may be updated based on patient status, additional functional criteria and insurance authorization.    Follow Up Recommendations  Outpatient OT    Assistance Recommended at Discharge Set up Supervision/Assistance  Patient can return home with the following  Assistance with cooking/housework;Assist for transportation;Assistance with feeding   Equipment Recommendations  None recommended by OT    Recommendations for Other Services      Precautions / Restrictions Precautions Precautions: Fall Precaution Comments: Increased area of vision loss since initial eval. Pt reports having increased difficulty seeing inferiorly now as well. Restrictions Weight Bearing Restrictions: No       Mobility Bed Mobility Overal bed mobility: Modified Independent                  Transfers                         Balance   Sitting-balance  support: No upper extremity supported, Feet supported Sitting balance-Leahy Scale: Good                                     ADL either performed or assessed with clinical judgement   ADL Overall ADL's : Needs assistance/impaired       Grooming Details (indicate cue type and reason): provided red bulit up handle to simulate grooming and feeding with R hand, increased control today                                    Extremity/Trunk Assessment              Vision   Vision Assessment?: Yes Eye Alignment: Within Functional Limits Ocular Range of Motion: Within Functional Limits Alignment/Gaze Preference: Within Defined Limits Tracking/Visual Pursuits: Able to track stimulus in all quads without difficulty Visual Fields: Other (comment) (R lower quadrant deficits) Additional Comments: pt unable to locate # of fingers in R lower quadrant, reports peripheral vision deficits from prior cva but this is new   Perception     Praxis      Cognition Arousal/Alertness: Awake/alert Behavior During Therapy: WFL for tasks assessed/performed Overall Cognitive Status: Within Functional Limits for tasks assessed  Exercises Exercises: Other exercises Other Exercises Other Exercises: BUE exericses x 10 reps with HEP provided: shoulder flexion/extension, elbow flexion/extension, supination/pronation (gravity eliminated), gross grasp/extension Other Exercises: Provided squeeze ball and thearputty-- provided HEPs    Shoulder Instructions       General Comments      Pertinent Vitals/ Pain       Pain Assessment Pain Assessment: No/denies pain Pain Intervention(s): Monitored during session  Home Living                                          Prior Functioning/Environment              Frequency  Min 2X/week        Progress Toward Goals  OT Goals(current goals can now  be found in the care plan section)  Progress towards OT goals: Progressing toward goals  Acute Rehab OT Goals Patient Stated Goal: use my R hand OT Goal Formulation: With patient Time For Goal Achievement: 09/16/22 Potential to Achieve Goals: Good  Plan Discharge plan remains appropriate;Frequency needs to be updated    Co-evaluation                 AM-PAC OT "6 Clicks" Daily Activity     Outcome Measure   Help from another person eating meals?: A Little Help from another person taking care of personal grooming?: A Little Help from another person toileting, which includes using toliet, bedpan, or urinal?: A Little Help from another person bathing (including washing, rinsing, drying)?: A Little Help from another person to put on and taking off regular upper body clothing?: A Little Help from another person to put on and taking off regular lower body clothing?: A Little 6 Click Score: 18    End of Session    OT Visit Diagnosis: Muscle weakness (generalized) (M62.81);Hemiplegia and hemiparesis;Low vision, both eyes (H54.2) Hemiplegia - Right/Left: Right Hemiplegia - dominant/non-dominant: Dominant Hemiplegia - caused by: Cerebral infarction   Activity Tolerance Patient tolerated treatment well   Patient Left in bed;with call bell/phone within reach   Nurse Communication          Time: 6203-5597 OT Time Calculation (min): 15 min  Charges: OT General Charges $OT Visit: 1 Visit OT Treatments $Neuromuscular Re-education: 8-22 mins  Rita [name], [place] [phone]   Delight Stare 09/05/2022, 1:14 PM

## 2022-09-05 NOTE — Care Management Important Message (Signed)
Important Message  Patient Details  Name: Rita [name] MRN: [id number] Date of Birth: December 22, 1966   Medicare Important Message Given:  Yes     Ashaunte Standley Montine Circle 09/05/2022, 3:11 PM

## 2022-09-05 NOTE — Progress Notes (Signed)
[facility] PROGRESS NOTE    Progress Note  Rita [name]  JIR:[id number] DOB: 1966/09/03 DOA: 09/01/2022 PCP: [facility]     Brief Narrative:   Rita [name] is an 56 y.o. female past medical history of chronic back pain, chronic abdominal pain, COPD, stroke, essential hypertension hyperlipidemia presents with right arm weakness when she woke up on 08/31/2022 in the ED she was found to be afebrile hemoglobin of 13 white blood cell count of 9 MRI of the brain showed multiple acute infarcts in the frontal and parietal lobe, MRI of the head was negative for large vessel occlusion neurologist was consulted recommended transfer to [facility]   Assessment/Plan:   Acute ischemic stroke multiple acute infarcts in the left frontal and parietal skull: CTA of the head showed occluded left vertebral artery occlusion of the left subclavian artery with distal reconstitution and moderate paraclinoid ICA and right paraclinoid stenosis Neurology was consulted recommended aspirin and Brilinta twice a day for 3 months. He also recommended high-dose statins. IR was consulted recommended transfer to [facility] for carotid stenting on 09/05/2022.  New acute abdominal pain: Was able to tolerate her diet yesterday she says her abdominal pain has resolved. Cancel CT angio, LDH and lactic are unremarkable  Tobacco abuse: Discussed cessation.  GERD: Continue Protonix.  Impaired glucose intolerance: With an A1c of 6.2 lifestyle modifications and follow-up with PCP.  Right lobe lung mass: Diagnosed in January 2020 lost to follow-up repeated CT scan showed resolution of left lower lobe.  Obesity: Lifestyle modification.  Opioid dependence: Continue buprenorphine.  Mixed hyperlipidemia Continue statins.  Hypokalemia: Replete orally continues to be low continue replete orally magnesium 1.9.  DVT prophylaxis: lovenox Family Communication:none Status is: Inpatient Remains inpatient  appropriate because: Acute CVA    Code Status:     Code Status Orders  (From admission, onward)           Start     Ordered   09/01/22 2043  Full code  Continuous        09/01/22 2043           Code Status History     Date Active Date Inactive Code Status Order ID Comments User Context   01/20/2019 1751 01/24/2019 1914 Full Code [id number]  Bethena Roys, MD ED   03/19/2018 1513 03/21/2018 1419 Full Code [id number]  Ulyses Amor, PA-C Inpatient   02/21/2018 2200 02/26/2018 2113 Full Code [id number]  Vianne Bulls, MD ED   01/19/2018 1312 01/19/2018 1954 Full Code [id number]  Elam Dutch, MD Inpatient   12/30/2016 2336 01/02/2017 2012 Full Code [id number]  Orvan Falconer, MD Inpatient   02/06/2015 0320 02/08/2015 1942 Full Code [id number]  Oswald Hillock, MD Inpatient   03/25/2013 1647 03/29/2013 1615 Full Code [id number]  Doree Albee, MD ED         IV Access:   Peripheral IV   Procedures and diagnostic studies:   No results found.   Medical Consultants:   None.   Subjective:    Rita [name] hungry this morning, abdominal pain has resolved.  She relates she tolerated her diet yesterday.  Objective:    Vitals:   09/04/22 0822 09/04/22 2000 09/05/22 0400 09/05/22 0740  BP: (!) 151/79 122/63 138/70   Pulse: 67 (!) 58 (!) 55   Resp: '12 14 16 20  '$ Temp: 97.7 F (36.5 C) 98.4 F (36.9 C) 98 F (36.7 C)   TempSrc: Oral Oral Oral  SpO2:  100% 99%   Weight:      Height:       SpO2: 99 %   Intake/Output Summary (Last 24 hours) at 09/05/2022 0818 Last data filed at 09/04/2022 1700 Gross per 24 hour  Intake 246.6 ml  Output --  Net 246.6 ml    Filed Weights   09/01/22 1523 09/01/22 2114  Weight: 101.5 kg 102.4 kg    Exam: General exam: In no acute distress. Respiratory system: Good air movement and clear to auscultation. Cardiovascular system: S1 & S2 heard, RRR. No JVD. Gastrointestinal system: Abdomen is nondistended, soft and nontender.   Extremities: No pedal edema. Skin: No rashes, lesions or ulcers Psychiatry: Judgement and insight appear normal. Mood & affect appropriate.  Data Reviewed:    Labs: Basic Metabolic Panel: Recent Labs  Lab 09/01/22 1550 09/01/22 1605 09/01/22 2128 09/02/22 0303 09/03/22 0517 09/04/22 0208  NA 139 138  --  141 140 140  K 3.4* 2.9*  --  3.0* 2.7* 3.1*  CL 102 103  --  104 107 103  CO2  --  24  --  '27 25 26  '$ GLUCOSE 142* 139*  --  103* 128* 131*  BUN 10 9  --  10 11 5*  CREATININE 0.80 0.91 0.80 0.78 0.91 0.94  CALCIUM  --  9.1  --  8.8* 8.9 8.9  MG  --   --   --  1.8  --  1.9  PHOS  --   --   --  3.3  --   --     GFR Estimated Creatinine Clearance: 82.2 mL/min (by C-G formula based on SCr of 0.94 mg/dL). Liver Function Tests: Recent Labs  Lab 09/01/22 1605 09/02/22 0303  AST 20 19  ALT 25 25  ALKPHOS 78 74  BILITOT 0.6 0.6  PROT 7.3 7.1  ALBUMIN 3.9 3.7    No results for input(s): "LIPASE", "AMYLASE" in the last 168 hours. No results for input(s): "AMMONIA" in the last 168 hours. Coagulation profile Recent Labs  Lab 09/01/22 1605 09/01/22 2128  INR 0.9 0.9    COVID-19 Labs  Recent Labs    09/04/22 0946  LDH 139    No results found for: "SARSCOV2NAA"  CBC: Recent Labs  Lab 09/01/22 2015 09/01/22 2127 09/01/22 2128 09/02/22 0303 09/03/22 0517  WBC 9.6 10.5 TEST WILL BE CREDITED 9.4 9.2  NEUTROABS  --  6.7  --   --   --   HGB 13.7 13.9 TEST WILL BE CREDITED 13.5 13.8  HCT 40.0 41.9 TEST WILL BE CREDITED 41.2 41.2  MCV 84.7 87.5 TEST WILL BE CREDITED 86.9 86.6  PLT 374 350 TEST WILL BE CREDITED 362 345    Cardiac Enzymes: No results for input(s): "CKTOTAL", "CKMB", "CKMBINDEX", "TROPONINI" in the last 168 hours. BNP (last 3 results) No results for input(s): "PROBNP" in the last 8760 hours. CBG: Recent Labs  Lab 09/01/22 1526  GLUCAP 154*    D-Dimer: No results for input(s): "DDIMER" in the last 72 hours. Hgb A1c: No results for  input(s): "HGBA1C" in the last 72 hours.  Lipid Profile: No results for input(s): "CHOL", "HDL", "LDLCALC", "TRIG", "CHOLHDL", "LDLDIRECT" in the last 72 hours.  Thyroid function studies: No results for input(s): "TSH", "T4TOTAL", "T3FREE", "THYROIDAB" in the last 72 hours.  Invalid input(s): "FREET3" Anemia work up: No results for input(s): "VITAMINB12", "FOLATE", "FERRITIN", "TIBC", "IRON", "RETICCTPCT" in the last 72 hours. Sepsis Labs: Recent Labs  Lab 09/01/22 2127  09/01/22 2128 09/02/22 0303 09/03/22 0517 09/04/22 0946  WBC 10.5 TEST WILL BE CREDITED 9.4 9.2  --   LATICACIDVEN  --   --   --   --  1.7    Microbiology No results found for this or any previous visit (from the past 240 hour(s)).   Medications:    aspirin EC  81 mg Oral Daily   atorvastatin  80 mg Oral q1800   buprenorphine-naloxone  1 tablet Sublingual Daily   enoxaparin (LOVENOX) injection  50 mg Subcutaneous Q24H   nicotine  14 mg Transdermal Daily   pantoprazole  40 mg Oral Daily   sodium chloride flush  3 mL Intravenous Once   ticagrelor  90 mg Oral BID   Continuous Infusions:    LOS: 3 days   Charlynne Cousins  [facility]  09/05/2022, 8:18 AM

## 2022-09-05 NOTE — Consult Note (Signed)
Chief Complaint: Patient was seen in consultation today for multiple intra-cranial stenoses  Referring Physician(s): Lora Havens  Supervising Physician: Luanne Bras  Patient Status: [facility] - In-pt  History of Present Illness: Rita [name] is a 56 y.o. female with a past medical history significant for tobacco use, chronic pancreatitis, GERD, COPD, HTN, HLD, left occipital CVA previously on Plavix who presented to [facility] ED 09/01/22 with complaints of right sided weakness and slurred speech. MRI brain w/ and w/o contrast was obtained which showed:  1. Multiple acute infarcts in the high left frontal and parietal lobes, potentially watershed. 2. Motion limited MRA without definite evidence of emergent proximal large vessel occlusion. 3. Particularly limited evaluation of the PCAs with nondiagnostic evaluation distal to the mid P2 PCAs bilaterally. A CTA could further characterize if clinically warranted.  CTA head/neck was also obtained which showed:  1. Occluded left vertebral artery at its origin. Small right vertebral artery is occluded at its V3 segment. Both intradural vertebral arteries are largely non-opacified with small/irregular basilar artery. Small but patent PCAs. 2. Occlusion of the proximal left subclavian artery with distal reconstitution. 3. Moderate to severe left cavernous and paraclinoid ICA and moderate right paraclinoid ICA stenosis. 4. Mild bilateral M1 MCA stenosis.  Teleneurology was consulted and recommendation was made for Brilinta 90 mg BID + ASA 81 mg QD. NIR was consulted and recommendation was made for outpatient follow up however given patient's history of poor compliance with medical recommendations decision was made to transfer her to [facility] for NIR evaluation.   Patient seen in her room, states she feels ok right now - better than when she came in. Her right hand "feels like a spring chicken" when she wakes up in the morning but slowly gets  weaker and harder to use as the day goes on. She also has trouble finding her words and forgets what she is saying sometimes which is also improving. She currently smokes but wants to quit. She is agreeable to NIR procedure.  Past Medical History:  Diagnosis Date   Arthritis    "qwhere" (02/22/2018)   Barrett's esophagus with esophagitis 03/26/2013   Chronic abdominal pain    Chronic lower back pain    Chronic pancreatitis (HCC)    COPD (chronic obstructive pulmonary disease) (HCC)    Depression    Dyspnea    GERD (gastroesophageal reflux disease)    Heart murmur    Hiatal hernia    High cholesterol    Hypertension    Peptic ulcer    Pneumonia 2000s X 1   Stroke ([place])    Tobacco use 03/26/2013    Past Surgical History:  Procedure Laterality Date   AORTIC ARCH ANGIOGRAPHY N/A 01/19/2018   Procedure: AORTIC ARCH ANGIOGRAPHY;  Surgeon: Elam Dutch, MD;  Location: [place] [facility];  Service: Cardiovascular;  Laterality: N/A;   BIOPSY  04/17/2017   Procedure: BIOPSY;  Surgeon: Daneil Dolin, MD;  [facility];  Service: Endoscopy;;  esophageal   CARDIAC CATHETERIZATION     CAROTID-SUBCLAVIAN BYPASS GRAFT Left 03/19/2018   Procedure: BYPASS GRAFT CAROTID-SUBCLAVIAN;  Surgeon: Elam Dutch, MD;  Location: [place];  Service: Vascular;  Laterality: Left;   [place]; 1988   COLONOSCOPY WITH PROPOFOL N/A 04/17/2017   Procedure: COLONOSCOPY WITH PROPOFOL;  Surgeon: Daneil Dolin, MD;  [facility];  Service: Endoscopy;  Laterality: N/A;  100   ESOPHAGOGASTRODUODENOSCOPY  Oct 2011   Dr. Laural Golden:  ulcer in distal esophagus, soft stricture at GE junction s/p balloon dilation PATH: BARRETT'S   ESOPHAGOGASTRODUODENOSCOPY (EGD) WITH ESOPHAGEAL DILATION N/A 03/27/2013   Procedure: ESOPHAGOGASTRODUODENOSCOPY (EGD) WITH ESOPHAGEAL DILATION;  Surgeon: Daneil Dolin, MD;  [facility];  Service: Endoscopy;  Laterality: N/A;  possible dilation    ESOPHAGOGASTRODUODENOSCOPY (EGD) WITH PROPOFOL N/A 01/02/2017   Procedure: ESOPHAGOGASTRODUODENOSCOPY (EGD) WITH PROPOFOL;  Surgeon: Daneil Dolin, MD;  [facility];  Service: Endoscopy;  Laterality: N/A;  with possible esophageal dilation   ESOPHAGOGASTRODUODENOSCOPY (EGD) WITH PROPOFOL N/A 04/17/2017   Procedure: ESOPHAGOGASTRODUODENOSCOPY (EGD) WITH PROPOFOL;  Surgeon: Daneil Dolin, MD;  [facility];  Service: Endoscopy;  Laterality: N/A;   EUS N/A 05/09/2013   Procedure: UPPER ENDOSCOPIC ULTRASOUND (EUS) LINEAR;  Surgeon: Milus Banister, MD;  Location: [facility];  Service: Endoscopy;  Laterality: N/A;   FRACTURE SURGERY     pt. denies   KNEE ARTHROSCOPY Right    LAPAROSCOPIC CHOLECYSTECTOMY     LEFT HEART CATH AND CORONARY ANGIOGRAPHY N/A 02/26/2018   Procedure: LEFT HEART CATH AND CORONARY ANGIOGRAPHY;  Surgeon: Burnell Blanks, MD;  Location: [place] [facility];  Service: Cardiovascular;  Laterality: N/A;   MALONEY DILATION N/A 04/17/2017   Procedure: Venia Minks DILATION;  Surgeon: Daneil Dolin, MD;  [facility];  Service: Endoscopy;  Laterality: N/A;   PATELLA FRACTURE SURGERY Left    POLYPECTOMY  04/17/2017   Procedure: POLYPECTOMY;  Surgeon: Daneil Dolin, MD;  [facility];  Service: Endoscopy;;   TEE WITHOUT CARDIOVERSION N/A 01/23/2019   Procedure: TRANSESOPHAGEAL ECHOCARDIOGRAM (TEE);  Surgeon: Jerline Pain, MD;  Location: [facility];  Service: Cardiovascular;  Laterality: N/A;   [place] N/A 01/19/2018   Procedure: UPPER EXTREMITY ANGIOGRAPHY;  Surgeon: Elam Dutch, MD;  Location: [place] [facility];  Service: Cardiovascular;  Laterality: N/A;    Allergies: Patient has no known allergies.  Medications: Prior to Admission medications   Medication Sig Start Date End Date Taking? Authorizing Provider  atorvastatin (LIPITOR) 80 MG tablet Take 1 tablet (80 mg total) by mouth  daily at 6 PM. 01/24/19  Yes Danford, Suann Larry, MD  Buprenorphine HCl-Naloxone HCl 8-2 MG FILM Take 1 Film by mouth 3 (three) times daily. 12/14/18  Yes [provider]  clopidogrel (PLAVIX) 75 MG tablet Take 1 tablet (75 mg total) by mouth daily. 01/24/19  Yes Danford, Suann Larry, MD  DEXILANT 60 MG capsule TAKE 1 CAPSULE(60 MG) BY MOUTH AT BEDTIME Patient taking differently: Take 60 mg by mouth daily. 02/19/19  Yes Annitta Needs, NP  potassium chloride (MICRO-K) 10 MEQ CR capsule Take 10 mEq by mouth 2 (two) times daily. 05/26/22  Yes [provider]  colchicine 0.6 MG tablet Take 1 tablet (0.6 mg total) by mouth 2 (two) times daily for 7 days. Patient not taking: Reported on 09/01/2022 09/20/19 09/27/19  Janeece Fitting, PA-C     Family History  Problem Relation Age of Onset   Asthma Mother    Heart failure Mother    Cancer Mother        pancreatic   Diabetes Mother    Hypertension Mother    Stroke Mother    Pancreatic cancer Mother        deceased   Heart failure Father    Diabetes Father    Colon cancer Neg Hx     Social History  Socioeconomic History   Marital status: Divorced    Spouse name: Not on file   Number of children: Not on file   Years of education: Not on file   Highest education level: Not on file  Occupational History   Occupation: farm  Tobacco Use   Smoking status: Every Day    Packs/day: 0.50    Years: 41.00    Total pack years: 20.50    Types: Cigarettes    Start date: 09/26/1982   Smokeless tobacco: Never  Vaping Use   Vaping Use: Former  Substance and Sexual Activity   Alcohol use: No   Drug use: No   Sexual activity: Not Currently    Partners: Male    Birth control/protection: Surgical  Other Topics Concern   Not on file  Social History Narrative   Lives in [place], [place].   Is on disability, was a Psychologist, sport and exercise and drove tractors.    Wears seatbelt.   Cannot eat meat due to esophagus.   Smokes cigarettes.   Drinks sodas,  1/2 liter a day.    3 boys and 1 girl. 2 boys live in [place]. 1 son died in 69th month of pregnancy.   Live with friend and his wife at this time.   Used to be married and was abandoned, he would not give her a divorce.   Right handed      Social Determinants of Health   Financial Resource Strain: Not on file  Food Insecurity: Not on file  Transportation Needs: Not on file  Physical Activity: Not on file  Stress: Not on file  Social Connections: Not on file     Review of Systems: A 12 point ROS discussed and pertinent positives are indicated in the HPI above.  All other systems are negative.  Review of Systems  Constitutional:  Negative for chills and fever.  Respiratory:  Positive for shortness of breath (intermittent per patient, sometimes wakes up and feels short of breath for no reason). Negative for cough.   Cardiovascular:  Negative for chest pain.  Gastrointestinal:  Negative for abdominal pain, diarrhea, nausea and vomiting.  Musculoskeletal:  Negative for back pain.  Neurological:  Negative for dizziness and headaches.    Vital Signs: BP 105/60   Pulse (!) 51   Temp 97.9 F (36.6 C) (Oral)   Resp 18   Ht '5\' 7"'$  (1.702 m)   Wt 225 lb 12 oz (102.4 kg)   LMP 02/09/2012   SpO2 98%   BMI 35.36 kg/m   Physical Exam Vitals and nursing note reviewed.  Constitutional:      General: She is not in acute distress. HENT:     Head: Normocephalic.     Mouth/Throat:     Mouth: Mucous membranes are moist.     Pharynx: Oropharynx is clear. No oropharyngeal exudate or posterior oropharyngeal erythema.     Comments: Edentulous Cardiovascular:     Rate and Rhythm: Regular rhythm. Bradycardia present.  Pulmonary:     Effort: Pulmonary effort is normal.     Breath sounds: Normal breath sounds.  Abdominal:     General: There is no distension.     Palpations: Abdomen is soft.     Tenderness: There is no abdominal tenderness.  Skin:    General: Skin is warm and dry.   Neurological:     Mental Status: She is alert.  Psychiatric:        Mood and Affect: Mood normal.  Behavior: Behavior normal.        Thought Content: Thought content normal.        Judgment: Judgment normal.    Alert, awake, and oriented x 3 Intermittent expressive aphasia, slurred speech. Comprehension in tact.  RUE weakness   MD Evaluation Airway: WNL Heart: WNL Abdomen: WNL Chest/ Lungs: WNL ASA  Classification: 3 Mallampati/Airway Score: Two   Imaging: ECHOCARDIOGRAM COMPLETE  Result Date: 09/02/2022    ECHOCARDIOGRAM REPORT   Patient Name:   Rita [name] Date of Exam: 09/02/2022 Medical Rec #:  [id number]     Height:       67.0 in Accession #:    [id number]    Weight:       225.7 lb Date of Birth:  [birth year]     BSA:          2.129 m Patient Age:    64 years      BP:           128/65 mmHg Patient Gender: F             HR:           64 bpm. Exam Location:  [facility] Procedure: 2D Echo, Color Doppler and Cardiac Doppler Indications:    Stroke I63.9  History:        Patient has prior history of Echocardiogram examinations, most                 recent 01/23/2019. Stroke and COPD; Risk Factors:Dyslipidemia,                 Current Smoker and Hypertension. Lung mass.  Sonographer:    Alvino Chapel RCS Referring Phys: [id number] OLADAPO ADEFESO IMPRESSIONS  1. Left ventricular ejection fraction, by estimation, is 60 to 65%. The left ventricle has normal function. The left ventricle has no regional wall motion abnormalities. There is mild left ventricular hypertrophy. Left ventricular diastolic parameters are consistent with Grade I diastolic dysfunction (impaired relaxation).  2. Right ventricular systolic function is normal. The right ventricular size is normal. There is normal pulmonary artery systolic pressure. The estimated right ventricular systolic pressure is 92.1 mmHg.  3. The mitral valve is normal in structure. No evidence of mitral valve regurgitation. No evidence of mitral  stenosis.  4. The aortic valve is tricuspid. Aortic valve regurgitation is not visualized. Mild aortic valve stenosis.  5. The inferior vena cava is normal in size with greater than 50% respiratory variability, suggesting right atrial pressure of 3 mmHg. FINDINGS  Left Ventricle: Left ventricular ejection fraction, by estimation, is 60 to 65%. The left ventricle has normal function. The left ventricle has no regional wall motion abnormalities. Definity contrast agent was given IV to delineate the left ventricular  endocardial borders. The left ventricular internal cavity size was normal in size. There is mild left ventricular hypertrophy. Left ventricular diastolic parameters are consistent with Grade I diastolic dysfunction (impaired relaxation). Right Ventricle: The right ventricular size is normal. No increase in right ventricular wall thickness. Right ventricular systolic function is normal. There is normal pulmonary artery systolic pressure. The tricuspid regurgitant velocity is 2.18 m/s, and  with an assumed right atrial pressure of 3 mmHg, the estimated right ventricular systolic pressure is 19.4 mmHg. Left Atrium: Left atrial size was normal in size. Right Atrium: Right atrial size was normal in size. Pericardium: There is no evidence of pericardial effusion. Mitral Valve: The mitral valve is normal in structure. No evidence of mitral  valve regurgitation. No evidence of mitral valve stenosis. Tricuspid Valve: The tricuspid valve is normal in structure. Tricuspid valve regurgitation is trivial. Aortic Valve: The aortic valve is tricuspid. Aortic valve regurgitation is not visualized. Mild aortic stenosis is present. Aortic valve mean gradient measures 9.0 mmHg. Aortic valve peak gradient measures 19.9 mmHg. Aortic valve area, by VTI measures 1.90 cm. Pulmonic Valve: The pulmonic valve was not well visualized. Pulmonic valve regurgitation is trivial. Aorta: The aortic root is normal in size and structure.  Venous: The inferior vena cava is normal in size with greater than 50% respiratory variability, suggesting right atrial pressure of 3 mmHg. IAS/Shunts: The interatrial septum was not well visualized.  LEFT VENTRICLE PLAX 2D LVIDd:         3.60 cm   Diastology LVIDs:         2.40 cm   LV e' medial:    6.20 cm/s LV PW:         1.20 cm   LV E/e' medial:  11.6 LV IVS:        1.20 cm   LV e' lateral:   7.72 cm/s LVOT diam:     1.90 cm   LV E/e' lateral: 9.3 LV SV:         77 LV SV Index:   36 LVOT Area:     2.84 cm  RIGHT VENTRICLE RV S prime:     11.30 cm/s TAPSE (M-mode): 2.3 cm LEFT ATRIUM             Index        RIGHT ATRIUM           Index LA diam:        3.60 cm 1.69 cm/m   RA Area:     12.20 cm LA Vol (A2C):   47.2 ml 22.17 ml/m  RA Volume:   24.50 ml  11.51 ml/m LA Vol (A4C):   41.9 ml 19.68 ml/m LA Biplane Vol: 44.7 ml 21.00 ml/m  AORTIC VALVE AV Area (Vmax):    1.60 cm AV Area (Vmean):   1.77 cm AV Area (VTI):     1.90 cm AV Vmax:           223.00 cm/s AV Vmean:          137.000 cm/s AV VTI:            0.407 m AV Peak Grad:      19.9 mmHg AV Mean Grad:      9.0 mmHg LVOT Vmax:         126.00 cm/s LVOT Vmean:        85.400 cm/s LVOT VTI:          0.273 m LVOT/AV VTI ratio: 0.67  AORTA Ao Root diam: 3.00 cm MITRAL VALVE               TRICUSPID VALVE MV Area (PHT): 2.34 cm    TR Peak grad:   19.0 mmHg MV Decel Time: 324 msec    TR Vmax:        218.00 cm/s MV E velocity: 71.80 cm/s MV A velocity: 92.50 cm/s  SHUNTS MV E/A ratio:  0.78        Systemic VTI:  0.27 m                            Systemic Diam: 1.90 cm Oswaldo Milian MD Electronically signed by Oswaldo Milian  MD Signature Date/Time: 09/02/2022/5:37:23 PM    Final    CT CHEST WO CONTRAST  Result Date: 09/02/2022 CLINICAL DATA:  History of right lung mass EXAM: CT CHEST WITHOUT CONTRAST TECHNIQUE: Multidetector CT imaging of the chest was performed following the standard protocol without IV contrast. RADIATION DOSE REDUCTION: This  exam was performed according to the departmental dose-optimization program which includes automated exposure control, adjustment of the mA and/or kV according to patient size and/or use of iterative reconstruction technique. COMPARISON:  Chest CT 01/20/2019 FINDINGS: Cardiovascular: Limited without intravenous contrast. Mild aortic atherosclerosis. No aneurysm. Normal cardiac size. No pericardial effusion. Mediastinum/Nodes: Midline trachea. No suspicious thyroid nodule. No suspicious lymph nodes. Esophagus within normal limits. Lungs/Pleura: Punctate calcified and non calcified nodules, not significantly changed and felt consistent with benign finding, no imaging follow-up is recommended. Previously noted right lower lobe masslike consolidation on the exam from 2020 is no longer visualized. No suspicious pulmonary nodules on today's study. No acute airspace disease, pleural effusion, or pneumothorax. Upper Abdomen: Hepatic steatosis Musculoskeletal: No chest wall mass or suspicious bone lesions identified. IMPRESSION: 1. The previously noted right lower lobe masslike consolidation is no longer visualized. No suspicious pulmonary nodules on today's study. 2. Hepatic steatosis Aortic Atherosclerosis (ICD10-I70.0). Electronically Signed   By: Donavan Foil M.D.   On: 09/02/2022 17:29   CT ANGIO HEAD NECK W WO CM  Result Date: 09/02/2022 CLINICAL DATA:  Neuro deficit, acute, stroke suspected acute stroke on MR brain EXAM: CT ANGIOGRAPHY HEAD AND NECK TECHNIQUE: Multidetector CT imaging of the head and neck was performed using the standard protocol during bolus administration of intravenous contrast. Multiplanar CT image reconstructions and MIPs were obtained to evaluate the vascular anatomy. Carotid stenosis measurements (when applicable) are obtained utilizing NASCET criteria, using the distal internal carotid diameter as the denominator. RADIATION DOSE REDUCTION: This exam was performed according to the  departmental dose-optimization program which includes automated exposure control, adjustment of the mA and/or kV according to patient size and/or use of iterative reconstruction technique. CONTRAST:  16m OMNIPAQUE IOHEXOL 350 MG/ML SOLN COMPARISON:  None Available. FINDINGS: CT HEAD FINDINGS Brain: Left frontoparietal infarcts better characterized on recent MRI. No evidence of progressive mass effect or acute hemorrhage. Multiple remote infarcts including in the cerebellum, left occipital lobe and left corona radiata. No evidence of hydrocephalus or mass lesion. Vascular: See below. Skull: No acute fracture. Sinuses/Orbits: Clear sinuses. Other: No mastoid effusions. Review of the MIP images confirms the above findings CTA NECK FINDINGS Aortic arch: Occlusion of the proximal left subclavian artery with distal reconstitution. Right carotid system: Atherosclerosis at the carotid bifurcation involving the proximal ICA without greater than 50% stenosis. Left carotid system: No greater than 50% stenosis. Atherosclerosis at the carotid bifurcation involving the proximal ICA. Vertebral arteries: Left vertebral artery is occluded at its origin with 4/irregular distal V2 reconstitution. Right vertebral artery is small, but patent proximally with occlusion at the V3 segment. Skeleton: No acute findings. Other neck: No acute findings. Upper chest: Visualized lung apices are clear. Review of the MIP images confirms the above findings CTA HEAD FINDINGS Anterior circulation: Bilateral intracranial ICAs are patent with areas of atherosclerotic narrowing, including moderate to severe left cavernous and paraclinoid and moderate right paraclinoid ICA stenosis. Mild high bilateral M1 MCA stenosis. Bilateral M1 and proximal M2 MCA vessels are patent. Bilateral ACAs are patent without proximal hemodynamically significant stenosis. Posterior circulation: Both intradural vertebral arteries are largely non-opacified with small/irregular  basilar artery. The PCAs are small  bilaterally but appear patent. Venous sinuses: As permitted by contrast timing, patent. Review of the MIP images confirms the above findings IMPRESSION: 1. Occluded left vertebral artery at its origin. Small right vertebral artery is occluded at its V3 segment. Both intradural vertebral arteries are largely non-opacified with small/irregular basilar artery. Small but patent PCAs. 2. Occlusion of the proximal left subclavian artery with distal reconstitution. 3. Moderate to severe left cavernous and paraclinoid ICA and moderate right paraclinoid ICA stenosis. 4. Mild bilateral M1 MCA stenosis. These results will be called to the ordering clinician or representative by the Radiologist Assistant, and communication documented in the PACS or [employer]. Electronically Signed   By: Margaretha Sheffield M.D.   On: 09/02/2022 10:27   MR Brain W and Wo Contrast  Result Date: 09/01/2022 CLINICAL DATA:  Neuro deficit, acute, stroke suspected EXAM: MRI HEAD WITHOUT AND WITH CONTRAST MRA HEAD WITHOUT CONTRAST TECHNIQUE: Multiplanar, multiecho pulse sequences of the brain and surrounding structures were obtained without and with intravenous contrast. Angiographic images of the Circle of Willis were obtained using MRA technique without intravenous contrast. CONTRAST:  6m GADAVIST GADOBUTROL 1 MMOL/ML IV SOLN COMPARISON:  MRI head 08/11/2021.  MRA 01/21/2019. FINDINGS: MRI HEAD FINDINGS Brain: Multiple acute infarcts in the high left frontal and parietal lobes, potentially watershed and/or left MCA territory. Many other remote infarcts, including in the corona radiata and cerebellum bilaterally. No evidence of acute hemorrhage, mass lesion, midline shift or hydrocephalus. Mild enhancement in the region of the infarct, probably enhance infarcts. Otherwise, no pathologic enhancement. Vascular: Detailed below. Skull and upper cervical spine: Normal marrow signal. Sinuses/Orbits: Mild  paranasal sinus mucosal thickening. No acute orbital findings. Other: No mastoid effusions. MRA HEAD FINDINGS Motion limited study. Anterior circulation: Limited assessment due to motion with bilateral intracranial ICAs, MCAs and ACAs grossly patent without visible proximal emergent large vessel occlusion. Posterior circulation: Limited study due to patient motion with the visualized basilar artery appearing patent. Only the distal most intradural vertebral arteries are imaged, which appear grossly patent. Poor assessment of the posterior cerebral arteries bilaterally with flow related signal in the P1 and proximal P2 segments and nondiagnostic evaluation more distally. Chronically small vertebrobasilar system. IMPRESSION: 1. Multiple acute infarcts in the high left frontal and parietal lobes, potentially watershed. 2. Motion limited MRA without definite evidence of emergent proximal large vessel occlusion. 3. Particularly limited evaluation of the PCAs with nondiagnostic evaluation distal to the mid P2 PCAs bilaterally. A CTA could further characterize if clinically warranted. Electronically Signed   By: FMargaretha SheffieldM.D.   On: 09/01/2022 18:44   MR ANGIO HEAD WO CONTRAST  Result Date: 09/01/2022 CLINICAL DATA:  Neuro deficit, acute, stroke suspected EXAM: MRI HEAD WITHOUT AND WITH CONTRAST MRA HEAD WITHOUT CONTRAST TECHNIQUE: Multiplanar, multiecho pulse sequences of the brain and surrounding structures were obtained without and with intravenous contrast. Angiographic images of the Circle of Willis were obtained using MRA technique without intravenous contrast. CONTRAST:  149mGADAVIST GADOBUTROL 1 MMOL/ML IV SOLN COMPARISON:  MRI head 08/11/2021.  MRA 01/21/2019. FINDINGS: MRI HEAD FINDINGS Brain: Multiple acute infarcts in the high left frontal and parietal lobes, potentially watershed and/or left MCA territory. Many other remote infarcts, including in the corona radiata and cerebellum bilaterally. No  evidence of acute hemorrhage, mass lesion, midline shift or hydrocephalus. Mild enhancement in the region of the infarct, probably enhance infarcts. Otherwise, no pathologic enhancement. Vascular: Detailed below. Skull and upper cervical spine: Normal marrow signal. Sinuses/Orbits: Mild paranasal sinus  mucosal thickening. No acute orbital findings. Other: No mastoid effusions. MRA HEAD FINDINGS Motion limited study. Anterior circulation: Limited assessment due to motion with bilateral intracranial ICAs, MCAs and ACAs grossly patent without visible proximal emergent large vessel occlusion. Posterior circulation: Limited study due to patient motion with the visualized basilar artery appearing patent. Only the distal most intradural vertebral arteries are imaged, which appear grossly patent. Poor assessment of the posterior cerebral arteries bilaterally with flow related signal in the P1 and proximal P2 segments and nondiagnostic evaluation more distally. Chronically small vertebrobasilar system. IMPRESSION: 1. Multiple acute infarcts in the high left frontal and parietal lobes, potentially watershed. 2. Motion limited MRA without definite evidence of emergent proximal large vessel occlusion. 3. Particularly limited evaluation of the PCAs with nondiagnostic evaluation distal to the mid P2 PCAs bilaterally. A CTA could further characterize if clinically warranted. Electronically Signed   By: Margaretha Sheffield M.D.   On: 09/01/2022 18:44   CT HEAD WO CONTRAST  Result Date: 09/01/2022 CLINICAL DATA:  Headache, slurred speech, right-sided weakness. Symptoms began yesterday. EXAM: CT HEAD WITHOUT CONTRAST TECHNIQUE: Contiguous axial images were obtained from the base of the skull through the vertex without intravenous contrast. RADIATION DOSE REDUCTION: This exam was performed according to the departmental dose-optimization program which includes automated exposure control, adjustment of the mA and/or kV according to  patient size and/or use of iterative reconstruction technique. COMPARISON:  CT head 08/11/2021 FINDINGS: Brain: No evidence of acute infarction, hemorrhage, hydrocephalus, extra-axial collection or mass lesion/mass effect. Chronic infarcts noted in the bilateral cerebellum, left occipital lobe, and left corona radiata. Vascular: No hyperdense vessel or unexpected calcification. Skull: Normal. Negative for fracture or focal lesion. Sinuses/Orbits: No acute finding. Trace mucosal thickening in the maxillary sinuses, left sphenoid sinus, and left frontal sinus as well as mild-to-moderate mucosal thickening in scattered bilateral anterior ethmoid air cells. Mastoid air cells are clear. Other: None. IMPRESSION: No acute intracranial abnormality. Chronic infarcts in the bilateral cerebellum, left occipital lobe, and left corona radiata. Electronically Signed   By: Ileana Roup M.D.   On: 09/01/2022 17:58    Labs:  CBC: Recent Labs    09/01/22 2127 09/01/22 2128 09/02/22 0303 09/03/22 0517  WBC 10.5 TEST WILL BE CREDITED 9.4 9.2  HGB 13.9 TEST WILL BE CREDITED 13.5 13.8  HCT 41.9 TEST WILL BE CREDITED 41.2 41.2  PLT 350 TEST WILL BE CREDITED 362 345    COAGS: Recent Labs    09/01/22 1605 09/01/22 2128  INR 0.9 0.9  APTT QR 28    BMP: Recent Labs    09/02/22 0303 09/03/22 0517 09/04/22 0208 09/05/22 0920  NA 141 140 140 139  K 3.0* 2.7* 3.1* 3.6  CL 104 107 103 110  CO2 '27 25 26 '$ 17*  GLUCOSE 103* 128* 131* 112*  BUN 10 11 5* 8  CALCIUM 8.8* 8.9 8.9 8.7*  CREATININE 0.78 0.91 0.94 0.92  GFRNONAA >60 >60 >60 >60    LIVER FUNCTION TESTS: Recent Labs    09/01/22 1605 09/02/22 0303 09/05/22 0920  BILITOT 0.6 0.6 0.4  AST '20 19 19  '$ ALT '25 25 25  '$ ALKPHOS 78 74 72  PROT 7.3 7.1 6.3*  ALBUMIN 3.9 3.7 3.4*    TUMOR MARKERS: No results for input(s): "AFPTM", "CEA", "CA199", "CHROMGRNA" in the last 8760 hours.  Assessment and Plan:  56 y/o F with history of CVA 2020 who  presented to [facility] ED on 9/7 with stroke symptoms, she was found to  have acute ischemic stroke as well as multiple intracranial stenosis. NIR has been consulted for cerebral angiogram to further assess intracranial stenosis.   Plan: - Diagnostic cerebral angiogram 9/12 pending any emergent NIR procedures - NPO at midnight 9/12 - Hold Lovenox until post procedure - Continue Brilinta 90 mg BID and ASA 81 mg QD  Risks and benefits of cerebral angiogram were discussed with the patient including, but not limited to bleeding, infection, vascular injury, stroke, or contrast induced renal failure. This interventional procedure involves the use of X-rays and because of the nature of the planned procedure, it is possible that we will have prolonged use of X-ray fluoroscopy. Potential radiation risks to you include (but are not limited to) the following: - A slightly elevated risk for cancer  several years later in life. This risk is typically less than 0.5% percent. This risk is low in comparison to the normal incidence of human cancer, which is 33% for women and 50% for men according to the [employer]. - Radiation induced injury can include skin redness, resembling a rash, tissue breakdown / ulcers and hair loss (which can be temporary or permanent).  The likelihood of either of these occurring depends on the difficulty of the procedure and whether you are sensitive to radiation due to previous procedures, disease, or genetic conditions.  IF your procedure requires a prolonged use of radiation, you will be notified and given written instructions for further action.  It is your responsibility to monitor the irradiated area for the 2 weeks following the procedure and to notify your physician if you are concerned that you have suffered a radiation induced injury.    All of the patient's questions were answered, patient is agreeable to proceed.  Consent signed and in chart.  Thank you for this  interesting consult.  I greatly enjoyed meeting Rita [name] and look forward to participating in their care.  A copy of this report was sent to the requesting provider on this date.  Electronically Signed: Joaquim Nam, PA-C 09/05/2022, 3:27 PM   I spent a total of 40 Minutes in face to face in clinical consultation, greater than 50% of which was counseling/coordinating care for diagnostic cerebral angiogram.

## 2022-09-05 NOTE — Progress Notes (Signed)
Physical Therapy Treatment Patient Details Name: Rita [name] MRN: [id number] DOB: December 06, 1966 Today's Date: 09/05/2022   History of Present Illness Pt is a 56 y/o female who presented to the ED 09/01/22 with right-sided weakness and slurred speech. MRI revealed multiple acute infarcts in the high L frontal and parietal lobes, potentially watershed. PMH significant of hyperlipidemia, Plavix, history of left occipital CVA without residual deficits (2020), tobacco abuse.    PT Comments    Pt progressing towards physical therapy goals. Was able to perform transfers and ambulation with gross mod I to supervision for safety. Pt scored 16/24 on the DGI indicating pt is at a higher risk for falls. Continue to recommend outpatient PT follow up to maximize functional independence, improve higher level balance, and decrease risk for falls. Will continue to follow acutely.    Recommendations for follow up theray are one component of a multi-disciplinary discharge planning process, led by the attending physician.  Recommendations may be updated based on patient status, additional functional criteria and insurance authorization.  Follow Up Recommendations  Outpatient PT     Assistance Recommended at Discharge PRN  Patient can return home with the following Assistance with cooking/housework;Assist for transportation;Help with stairs or ramp for entrance   Equipment Recommendations  None recommended by PT    Recommendations for Other Services       Precautions / Restrictions Precautions Precautions: Fall Precaution Comments: Increased area of vision loss since initial eval. Pt reports having increased difficulty seeing inferiorly now as well. Restrictions Weight Bearing Restrictions: No     Mobility  Bed Mobility Overal bed mobility: Modified Independent             General bed mobility comments: HOB elevated.    Transfers Overall transfer level: Needs assistance Equipment used:  None Transfers: Sit to/from Stand Sit to Stand: Supervision           General transfer comment: Light supervision for safety. Pt turning head to accommodate for vision loss.    Ambulation/Gait Ambulation/Gait assistance: Supervision Gait Distance (Feet): 300 Feet Assistive device: None Gait Pattern/deviations: Step-through pattern, Decreased stride length, Trunk flexed Gait velocity: Decreased Gait velocity interpretation: 1.31 - 2.62 ft/sec, indicative of limited community ambulator   General Gait Details: Slow and guarded. Pt appears to be scanning hallway fairly well to accommodate for vision loss.   Stairs Stairs: Yes Stairs assistance: Min guard Stair Management: Two rails, Alternating pattern, Forwards Number of Stairs: 5 General stair comments: Practice stairs in rehab gym. Pt was able to complete without assist as part of the DGI. Close guard for safety.   Wheelchair Mobility    Modified Rankin (Stroke Patients Only) Modified Rankin (Stroke Patients Only) Pre-Morbid Rankin Score: No significant disability Modified Rankin: Moderately severe disability     Balance Overall balance assessment: Needs assistance Sitting-balance support: Feet supported, No upper extremity supported Sitting balance-Leahy Scale: Fair     Standing balance support: No upper extremity supported Standing balance-Leahy Scale: Fair                   Standardized Balance Assessment Standardized Balance Assessment : Dynamic Gait Index   Dynamic Gait Index Level Surface: Mild Impairment Change in Gait Speed: Mild Impairment Gait with Horizontal Head Turns: Mild Impairment Gait with Vertical Head Turns: Normal Gait and Pivot Turn: Mild Impairment Step Over Obstacle: Moderate Impairment Step Around Obstacles: Mild Impairment Steps: Mild Impairment Total Score: 16      Cognition Arousal/Alertness: Awake/alert Behavior During Therapy:  WFL for tasks  assessed/performed Overall Cognitive Status: Within Functional Limits for tasks assessed                                          Exercises      General Comments        Pertinent Vitals/Pain Pain Assessment Pain Assessment: Faces Faces Pain Scale: No hurt Pain Intervention(s): Monitored during session    Home Living                          Prior Function            PT Goals (current goals can now be found in the care plan section) Acute Rehab PT Goals Patient Stated Goal: return home PT Goal Formulation: With patient Time For Goal Achievement: 09/09/22 Potential to Achieve Goals: Good Progress towards PT goals: Progressing toward goals    Frequency    Min 1X/week      PT Plan Frequency needs to be updated    Co-evaluation              AM-PAC PT "6 Clicks" Mobility   Outcome Measure  Help needed turning from your back to your side while in a flat bed without using bedrails?: None Help needed moving from lying on your back to sitting on the side of a flat bed without using bedrails?: A Little Help needed moving to and from a bed to a chair (including a wheelchair)?: None Help needed standing up from a chair using your arms (e.g., wheelchair or bedside chair)?: None Help needed to walk in hospital room?: None Help needed climbing 3-5 steps with a railing? : A Little 6 Click Score: 22    End of Session   Activity Tolerance: Patient tolerated treatment well;Patient limited by fatigue Patient left: in bed;with call bell/phone within reach Nurse Communication: Mobility status PT Visit Diagnosis: Unsteadiness on feet (R26.81);Other abnormalities of gait and mobility (R26.89);Other symptoms and signs involving the nervous system (R29.898);Muscle weakness (generalized) (M62.81)     Time: 0962-8366 PT Time Calculation (min) (ACUTE ONLY): 22 min  Charges:  $Physical Performance Test: 8-22 mins                     Rolinda Roan, PT, DPT Acute Rehabilitation Services Secure Chat Preferred Office: [phone]    Thelma Comp 09/05/2022, 12:53 PM

## 2022-09-05 NOTE — TOC Initial Note (Signed)
Transition of Care [facility]) - Initial/Assessment Note    Patient Details  Name: Rita [name] MRN: [id number] Date of Birth: 12-Dec-1966  Transition of [facility]) CM/SW Contact:    Pollie Friar, RN Phone Number: 09/05/2022, 12:00 PM  Clinical Narrative:                 Pt is from home with her significant other. She states he is home in the daytime and works at night.  She has a cane at home. She denies any issues with home medication or transportation. Current recommendations are for outpatient rehab. She is interested in attending at [facility] rehab. CM will arrange post stent placement in case recommendations change.  TOC following.  Expected Discharge Plan: OP Rehab Barriers to Discharge: Continued Medical Work up   Patient Goals and CMS Choice     Choice offered to / list presented to : Patient  Expected Discharge Plan and Services Expected Discharge Plan: OP Rehab   Discharge Planning Services: CM Consult   Living arrangements for the past 2 months: Single Family Home                                      Prior Living Arrangements/Services Living arrangements for the past 2 months: Single Family Home Lives with:: Significant Other Patient language and need for interpreter reviewed:: Yes Do you feel safe going back to the place where you live?: Yes          Current home services: DME (cane) Criminal Activity/Legal Involvement Pertinent to Current Situation/Hospitalization: No - Comment as needed  Activities of Daily Living Home Assistive Devices/Equipment: None ADL Screening (condition at time of admission) Patient's cognitive ability adequate to safely complete daily activities?: Yes Is the patient deaf or have difficulty hearing?: No Does the patient have difficulty seeing, even when wearing glasses/contacts?: No Does the patient have difficulty concentrating, remembering, or making decisions?: No Patient able to express need for assistance  with ADLs?: Yes Does the patient have difficulty dressing or bathing?: No Independently performs ADLs?: No Communication: Independent Dressing (OT): Needs assistance Is this a change from baseline?: Change from baseline, expected to last <3days Grooming: Needs assistance Is this a change from baseline?: Change from baseline, expected to last <3 days Feeding: Independent Bathing: Needs assistance Is this a change from baseline?: Change from baseline, expected to last <3 days Toileting: Needs assistance Is this a change from baseline?: Change from baseline, expected to last <3 days In/Out Bed: Needs assistance Is this a change from baseline?: Change from baseline, expected to last <3 days Walks in Home: Needs assistance Is this a change from baseline?: Change from baseline, expected to last <3 days Does the patient have difficulty walking or climbing stairs?: Yes Weakness of Legs: Both Weakness of Arms/Hands: None  Permission Sought/Granted                  Emotional Assessment Appearance:: Appears stated age Attitude/Demeanor/Rapport: Engaged Affect (typically observed): Accepting Orientation: : Oriented to Self, Oriented to Place, Oriented to  Time, Oriented to Situation   Psych Involvement: No (comment)  Admission diagnosis:  Acute ischemic stroke [facility]) [I63.9] Patient Active Problem List   Diagnosis Date Noted   Opioid dependence ([place]) 09/02/2022   Obesity, Class II, BMI 35-39.9 09/02/2022   Lung mass 09/02/2022   Impaired glucose tolerance 09/02/2022   Acute ischemic stroke ([place])  09/01/2022   Mixed hyperlipidemia 09/01/2022   Cavitating mass in right lower lung lobe (PNA Vs Cancer) 01/21/2019   Gastroenteritis 01/20/2019   Left sided cerebral hemisphere cerebrovascular accident (CVA) ([place]) 01/20/2019   Stenosis of left subclavian artery ([place]) 03/19/2018   Chest pain 02/21/2018   Left subclavian artery occlusion 02/21/2018   Rectal bleeding 03/21/2017   Hiatal  hernia with gastroesophageal reflux disease and esophagitis    Biliary pain    Pain of upper abdomen    Elevated liver enzymes    Chronic abdominal pain    Pancreatitis 02/06/2015   Acute pancreatitis    Elevated LFTs    Pancreatitis, acute 05/09/2013   Hypokalemia 03/27/2013   Anemia 03/27/2013   Esophageal dysphagia 03/26/2013   Chronic back pain 03/26/2013   GERD (gastroesophageal reflux disease) 03/26/2013   Tobacco use 03/26/2013   Barrett's esophagus with esophagitis 03/26/2013   Elevated lipase 08/20/2011   PCP:  Center, [place]:   [facility] [place], [place] AT [place] [address] [place] [address] Phone: [phone] Fax: [phone]     Social Determinants of Health (SDOH) Interventions    Readmission Risk Interventions     No data to display

## 2022-09-05 NOTE — Progress Notes (Signed)
Mobility Specialist: Progress Note   09/05/22 1436  Mobility  Activity Ambulated with assistance in hallway  Level of Assistance Contact guard assist, steadying assist  Assistive Device None  Distance Ambulated (ft) 480 ft  Activity Response Tolerated well  $Mobility charge 1 Mobility   Post-Mobility: 72 HR, 99% SpO2   Pt received in the bed and agreeable to mobility. C/o R sided weakness as well as mild SOB during ambulation. No c/o pain or dizziness. Pt sitting EOB after session with call bell in reach.   [facility] Nykira Reddix Mobility Specialist Mobility Specialist [address]: [phone]

## 2022-09-06 ENCOUNTER — Inpatient Hospital Stay (HOSPITAL_COMMUNITY): Payer: Medicare Other

## 2022-09-06 ENCOUNTER — Other Ambulatory Visit (HOSPITAL_COMMUNITY): Payer: Self-pay

## 2022-09-06 ENCOUNTER — Telehealth (HOSPITAL_COMMUNITY): Payer: Self-pay | Admitting: Pharmacy Technician

## 2022-09-06 DIAGNOSIS — E876 Hypokalemia: Secondary | ICD-10-CM | POA: Diagnosis not present

## 2022-09-06 DIAGNOSIS — R7302 Impaired glucose tolerance (oral): Secondary | ICD-10-CM | POA: Diagnosis not present

## 2022-09-06 DIAGNOSIS — R918 Other nonspecific abnormal finding of lung field: Secondary | ICD-10-CM | POA: Diagnosis not present

## 2022-09-06 DIAGNOSIS — E669 Obesity, unspecified: Secondary | ICD-10-CM | POA: Diagnosis not present

## 2022-09-06 HISTORY — PX: IR ANGIO INTRA EXTRACRAN SEL COM CAROTID INNOMINATE BILAT MOD SED: IMG5360

## 2022-09-06 HISTORY — PX: IR US GUIDE VASC ACCESS RIGHT: IMG2390

## 2022-09-06 HISTORY — PX: IR ANGIO VERTEBRAL SEL SUBCLAVIAN INNOMINATE UNI R MOD SED: IMG5365

## 2022-09-06 LAB — CBC
HCT: 37.2 % (ref 36.0–46.0)
Hemoglobin: 12.5 g/dL (ref 12.0–15.0)
MCH: 29.1 pg (ref 26.0–34.0)
MCHC: 33.6 g/dL (ref 30.0–36.0)
MCV: 86.7 fL (ref 80.0–100.0)
Platelets: 332 10*3/uL (ref 150–400)
RBC: 4.29 MIL/uL (ref 3.87–5.11)
RDW: 14.4 % (ref 11.5–15.5)
WBC: 8.7 10*3/uL (ref 4.0–10.5)
nRBC: 0 % (ref 0.0–0.2)

## 2022-09-06 LAB — BASIC METABOLIC PANEL
Anion gap: 6 (ref 5–15)
BUN: 12 mg/dL (ref 6–20)
CO2: 21 mmol/L — ABNORMAL LOW (ref 22–32)
Calcium: 8.7 mg/dL — ABNORMAL LOW (ref 8.9–10.3)
Chloride: 112 mmol/L — ABNORMAL HIGH (ref 98–111)
Creatinine, Ser: 1.15 mg/dL — ABNORMAL HIGH (ref 0.44–1.00)
GFR, Estimated: 56 mL/min — ABNORMAL LOW (ref >60)
Glucose, Bld: 121 mg/dL — ABNORMAL HIGH (ref 70–99)
Potassium: 3.8 mmol/L (ref 3.5–5.1)
Sodium: 139 mmol/L (ref 135–145)

## 2022-09-06 LAB — PROTIME-INR
INR: 1 (ref 0.8–1.2)
Prothrombin Time: 13.4 seconds (ref 11.4–15.2)

## 2022-09-06 MED ORDER — IOHEXOL 300 MG/ML  SOLN
100.0000 mL | Freq: Once | INTRAMUSCULAR | Status: AC | PRN
  Administered 2022-09-06: 36 mL via INTRA_ARTERIAL

## 2022-09-06 MED ORDER — LIDOCAINE HCL 1 % IJ SOLN
INTRAMUSCULAR | Status: AC
  Administered 2022-09-06: 10 mL
  Filled 2022-09-06: qty 20

## 2022-09-06 MED ORDER — IOHEXOL 300 MG/ML  SOLN
100.0000 mL | Freq: Once | INTRAMUSCULAR | Status: AC | PRN
  Administered 2022-09-06: 30 mL via INTRA_ARTERIAL

## 2022-09-06 MED ORDER — HEPARIN SODIUM (PORCINE) 1000 UNIT/ML IJ SOLN
INTRAMUSCULAR | Status: AC
  Filled 2022-09-06: qty 10

## 2022-09-06 MED ORDER — NITROGLYCERIN 1 MG/10 ML FOR IR/CATH LAB
INTRA_ARTERIAL | Status: AC
  Filled 2022-09-06: qty 10

## 2022-09-06 MED ORDER — CEFAZOLIN SODIUM-DEXTROSE 2-4 GM/100ML-% IV SOLN
2.0000 g | INTRAVENOUS | Status: AC
  Filled 2022-09-06: qty 100

## 2022-09-06 MED ORDER — VERAPAMIL HCL 2.5 MG/ML IV SOLN
INTRAVENOUS | Status: AC
  Filled 2022-09-06: qty 2

## 2022-09-06 MED ORDER — FENTANYL CITRATE (PF) 100 MCG/2ML IJ SOLN
INTRAMUSCULAR | Status: AC | PRN
  Administered 2022-09-06 (×4): 25 ug via INTRAVENOUS

## 2022-09-06 MED ORDER — MIDAZOLAM HCL 2 MG/2ML IJ SOLN
INTRAMUSCULAR | Status: AC | PRN
  Administered 2022-09-06 (×2): 1 mg via INTRAVENOUS

## 2022-09-06 MED ORDER — FENTANYL CITRATE (PF) 100 MCG/2ML IJ SOLN
INTRAMUSCULAR | Status: AC
  Filled 2022-09-06: qty 2

## 2022-09-06 MED ORDER — HEPARIN SODIUM (PORCINE) 1000 UNIT/ML IJ SOLN
INTRAMUSCULAR | Status: AC | PRN
  Administered 2022-09-06: 1000 [IU] via INTRAVENOUS

## 2022-09-06 MED ORDER — MIDAZOLAM HCL 2 MG/2ML IJ SOLN
INTRAMUSCULAR | Status: AC
  Filled 2022-09-06: qty 2

## 2022-09-06 MED ORDER — SODIUM CHLORIDE 0.9 % IV SOLN: INTRAVENOUS | Status: AC

## 2022-09-06 NOTE — Progress Notes (Signed)
Patient back to room from IR.

## 2022-09-06 NOTE — Procedures (Signed)
INR. Status post right vertebral arteriogram, bilateral common carotid arteriograms. Right CFA approach. Findings.  1.Prominent ulcerated plaque of the left internal carotid artery cavernous segment associated with an approximately 60% stenosis and a 2 x 2 mm saccular aneurysm projecting inferiorly and medially?  Superior posterior region aneurysm. 2 Occluded nondominant right vertebral artery at the level of C1.  Distal reconstitution of the basilar artery and the right posterior inferior cerebellar l artery from musculoskeletal branch of the ascending cervical branch of the right thyrocervical trunk. 3.  Patent  left common carotid artery to distal left subclavian artery graft, with delayed retrograde opacification of the left vertebral artery from the ipsilateral occipital artery.  Arlean Hopping MD

## 2022-09-06 NOTE — Progress Notes (Signed)
Referring Physician(s): Lora Havens  Supervising Physician: Luanne Bras  Patient Status:  [facility] - In-pt  Chief Complaint: Acute ischemic stroke s/p diagnostic cerebral angiogram 09/06/22 and findings of prominent ulcerated plaque of the left ICA with stenosis and a 2 x 2 sacular aneurysm  Subjective: Patient awake/alert and sitting up in bed. Her significant other is in the room. Patient was seen this afternoon post-diagnostic cerebral angiogram to discuss the findings and possible treatment/management options.   Allergies: Patient has no known allergies.  Medications: Prior to Admission medications   Medication Sig Start Date End Date Taking? Authorizing Provider  atorvastatin (LIPITOR) 80 MG tablet Take 1 tablet (80 mg total) by mouth daily at 6 PM. 01/24/19  Yes Danford, Suann Larry, MD  Buprenorphine HCl-Naloxone HCl 8-2 MG FILM Take 1 Film by mouth 3 (three) times daily. 12/14/18  Yes [provider]  clopidogrel (PLAVIX) 75 MG tablet Take 1 tablet (75 mg total) by mouth daily. 01/24/19  Yes Danford, Suann Larry, MD  DEXILANT 60 MG capsule TAKE 1 CAPSULE(60 MG) BY MOUTH AT BEDTIME Patient taking differently: Take 60 mg by mouth daily. 02/19/19  Yes Annitta Needs, NP  potassium chloride (MICRO-K) 10 MEQ CR capsule Take 10 mEq by mouth 2 (two) times daily. 05/26/22  Yes [provider]  colchicine 0.6 MG tablet Take 1 tablet (0.6 mg total) by mouth 2 (two) times daily for 7 days. Patient not taking: Reported on 09/01/2022 09/20/19 09/27/19  Janeece Fitting, PA-C     Vital Signs: BP 123/70   Pulse 63   Temp 98.1 F (36.7 C) (Oral)   Resp 17   Ht _0  (1.702 m)   Wt 225 lb 12 oz (102.4 kg)   LMP 02/09/2012   SpO2 100%   BMI 35.36 kg/m   Physical Exam Constitutional:      General: She is not in acute distress.    Appearance: She is not ill-appearing.  Cardiovascular:     Comments: Right groin vascular site is clean, soft, dry and non-tender.   Pulmonary:     Effort: Pulmonary effort is normal.  Skin:    General: Skin is warm and dry.  Neurological:     Mental Status: She is alert.     Imaging: ECHOCARDIOGRAM COMPLETE  Result Date: 09/02/2022    ECHOCARDIOGRAM REPORT   Patient Name:   Rita [name] Date of Exam: 09/02/2022 Medical Rec #:  [id number]     Height:       67.0 in Accession #:    [id number]    Weight:       225.7 lb Date of Birth:  [birth year]     BSA:          2.129 m Patient Age:    56 years      BP:           128/65 mmHg Patient Gender: F             HR:           64 bpm. Exam Location:  [facility] Procedure: 2D Echo, Color Doppler and Cardiac Doppler Indications:    Stroke I63.9  History:        Patient has prior history of Echocardiogram examinations, most                 recent 01/23/2019. Stroke and COPD; Risk Factors:Dyslipidemia,  Current Smoker and Hypertension. Lung mass.  Sonographer:    Alvino Chapel RCS Referring Phys: [id number] OLADAPO ADEFESO IMPRESSIONS  1. Left ventricular ejection fraction, by estimation, is 60 to 65%. The left ventricle has normal function. The left ventricle has no regional wall motion abnormalities. There is mild left ventricular hypertrophy. Left ventricular diastolic parameters are consistent with Grade I diastolic dysfunction (impaired relaxation).  2. Right ventricular systolic function is normal. The right ventricular size is normal. There is normal pulmonary artery systolic pressure. The estimated right ventricular systolic pressure is 86.5 mmHg.  3. The mitral valve is normal in structure. No evidence of mitral valve regurgitation. No evidence of mitral stenosis.  4. The aortic valve is tricuspid. Aortic valve regurgitation is not visualized. Mild aortic valve stenosis.  5. The inferior vena cava is normal in size with greater than 50% respiratory variability, suggesting right atrial pressure of 3 mmHg. FINDINGS  Left Ventricle: Left ventricular ejection fraction, by estimation,  is 60 to 65%. The left ventricle has normal function. The left ventricle has no regional wall motion abnormalities. Definity contrast agent was given IV to delineate the left ventricular  endocardial borders. The left ventricular internal cavity size was normal in size. There is mild left ventricular hypertrophy. Left ventricular diastolic parameters are consistent with Grade I diastolic dysfunction (impaired relaxation). Right Ventricle: The right ventricular size is normal. No increase in right ventricular wall thickness. Right ventricular systolic function is normal. There is normal pulmonary artery systolic pressure. The tricuspid regurgitant velocity is 2.18 m/s, and  with an assumed right atrial pressure of 3 mmHg, the estimated right ventricular systolic pressure is 78.4 mmHg. Left Atrium: Left atrial size was normal in size. Right Atrium: Right atrial size was normal in size. Pericardium: There is no evidence of pericardial effusion. Mitral Valve: The mitral valve is normal in structure. No evidence of mitral valve regurgitation. No evidence of mitral valve stenosis. Tricuspid Valve: The tricuspid valve is normal in structure. Tricuspid valve regurgitation is trivial. Aortic Valve: The aortic valve is tricuspid. Aortic valve regurgitation is not visualized. Mild aortic stenosis is present. Aortic valve mean gradient measures 9.0 mmHg. Aortic valve peak gradient measures 19.9 mmHg. Aortic valve area, by VTI measures 1.90 cm. Pulmonic Valve: The pulmonic valve was not well visualized. Pulmonic valve regurgitation is trivial. Aorta: The aortic root is normal in size and structure. Venous: The inferior vena cava is normal in size with greater than 50% respiratory variability, suggesting right atrial pressure of 3 mmHg. IAS/Shunts: The interatrial septum was not well visualized.  LEFT VENTRICLE PLAX 2D LVIDd:         3.60 cm   Diastology LVIDs:         2.40 cm   LV e' medial:    6.20 cm/s LV PW:         1.20 cm    LV E/e' medial:  11.6 LV IVS:        1.20 cm   LV e' lateral:   7.72 cm/s LVOT diam:     1.90 cm   LV E/e' lateral: 9.3 LV SV:         77 LV SV Index:   36 LVOT Area:     2.84 cm  RIGHT VENTRICLE RV S prime:     11.30 cm/s TAPSE (M-mode): 2.3 cm LEFT ATRIUM             Index        RIGHT ATRIUM  Index LA diam:        3.60 cm 1.69 cm/m   RA Area:     12.20 cm LA Vol (A2C):   47.2 ml 22.17 ml/m  RA Volume:   24.50 ml  11.51 ml/m LA Vol (A4C):   41.9 ml 19.68 ml/m LA Biplane Vol: 44.7 ml 21.00 ml/m  AORTIC VALVE AV Area (Vmax):    1.60 cm AV Area (Vmean):   1.77 cm AV Area (VTI):     1.90 cm AV Vmax:           223.00 cm/s AV Vmean:          137.000 cm/s AV VTI:            0.407 m AV Peak Grad:      19.9 mmHg AV Mean Grad:      9.0 mmHg LVOT Vmax:         126.00 cm/s LVOT Vmean:        85.400 cm/s LVOT VTI:          0.273 m LVOT/AV VTI ratio: 0.67  AORTA Ao Root diam: 3.00 cm MITRAL VALVE               TRICUSPID VALVE MV Area (PHT): 2.34 cm    TR Peak grad:   19.0 mmHg MV Decel Time: 324 msec    TR Vmax:        218.00 cm/s MV E velocity: 71.80 cm/s MV A velocity: 92.50 cm/s  SHUNTS MV E/A ratio:  0.78        Systemic VTI:  0.27 m                            Systemic Diam: 1.90 cm Oswaldo Milian MD Electronically signed by Oswaldo Milian MD Signature Date/Time: 09/02/2022/5:37:23 PM    Final    CT CHEST WO CONTRAST  Result Date: 09/02/2022 CLINICAL DATA:  History of right lung mass EXAM: CT CHEST WITHOUT CONTRAST TECHNIQUE: Multidetector CT imaging of the chest was performed following the standard protocol without IV contrast. RADIATION DOSE REDUCTION: This exam was performed according to the departmental dose-optimization program which includes automated exposure control, adjustment of the mA and/or kV according to patient size and/or use of iterative reconstruction technique. COMPARISON:  Chest CT 01/20/2019 FINDINGS: Cardiovascular: Limited without intravenous contrast. Mild aortic  atherosclerosis. No aneurysm. Normal cardiac size. No pericardial effusion. Mediastinum/Nodes: Midline trachea. No suspicious thyroid nodule. No suspicious lymph nodes. Esophagus within normal limits. Lungs/Pleura: Punctate calcified and non calcified nodules, not significantly changed and felt consistent with benign finding, no imaging follow-up is recommended. Previously noted right lower lobe masslike consolidation on the exam from 2020 is no longer visualized. No suspicious pulmonary nodules on today's study. No acute airspace disease, pleural effusion, or pneumothorax. Upper Abdomen: Hepatic steatosis Musculoskeletal: No chest wall mass or suspicious bone lesions identified. IMPRESSION: 1. The previously noted right lower lobe masslike consolidation is no longer visualized. No suspicious pulmonary nodules on today's study. 2. Hepatic steatosis Aortic Atherosclerosis (ICD10-I70.0). Electronically Signed   By: Donavan Foil M.D.   On: 09/02/2022 17:29    Labs:  CBC: Recent Labs    09/01/22 2128 09/02/22 0303 09/03/22 0517 09/06/22 0313  WBC TEST WILL BE CREDITED 9.4 9.2 8.7  HGB TEST WILL BE CREDITED 13.5 13.8 12.5  HCT TEST WILL BE CREDITED 41.2 41.2 37.2  PLT TEST WILL BE CREDITED 362 345 332  COAGS: Recent Labs    09/01/22 1605 09/01/22 2128 09/06/22 0313  INR 0.9 0.9 1.0  APTT QR 28  --     BMP: Recent Labs    09/03/22 0517 09/04/22 0208 09/05/22 0920 09/06/22 0313  NA 140 140 139 139  K 2.7* 3.1* 3.6 3.8  CL 107 103 110 112*  CO2 25 26 17* 21*  GLUCOSE 128* 131* 112* 121*  BUN 11 5* 8 12  CALCIUM 8.9 8.9 8.7* 8.7*  CREATININE 0.91 0.94 0.92 1.15*  GFRNONAA >60 >60 >60 56*    LIVER FUNCTION TESTS: Recent Labs    09/01/22 1605 09/02/22 0303 09/05/22 0920  BILITOT 0.6 0.6 0.4  AST _0 ALT _1 ALKPHOS 78 74 72  PROT 7.3 7.1 6.3*  ALBUMIN 3.9 3.7 3.4*    Assessment and Plan:  Acute ischemic stroke s/p diagnostic cerebral angiogram 09/06/22  and findings of prominent ulcerated plaque of the left ICA with stenosis and a 2 x 2 sacular aneurysm  Dr. Estanislado Pandy met with the patient this afternoon to discuss the findings of today's diagnostic cerebral angiogram. He discussed possible treatment/management options including conservative medical management or endovascular treatment with a pipeline flow diverter. The procedure, risks and benefits were discussed including the risk of stroke (1%), general anesthesia involvement and transfer to the Neuro ICU post-procedure for observation. The patient and her significant other were in agreement to proceed.  Risks and benefits of this procedure were discussed with the patient including, but not limited to bleeding, infection, vascular injury or contrast induced renal failure.  This interventional procedure involves the use of X-rays and because of the nature of the planned procedure, it is possible that we will have prolonged use of X-ray fluoroscopy.  Potential radiation risks to you include (but are not limited to) the following: - A slightly elevated risk for cancer  several years later in life. This risk is typically less than 0.5% percent. This risk is low in comparison to the normal incidence of human cancer, which is 33% for women and 50% for men according to the [employer]. - Radiation induced injury can include skin redness, resembling a rash, tissue breakdown / ulcers and hair loss (which can be temporary or permanent).   The likelihood of either of these occurring depends on the difficulty of the procedure and whether you are sensitive to radiation due to previous procedures, disease, or genetic conditions.   IF your procedure requires a prolonged use of radiation, you will be notified and given written instructions for further action.  It is your responsibility to monitor the irradiated area for the 2 weeks following the procedure and to notify your physician if you are  concerned that you have suffered a radiation induced injury.    All of the patient's questions were answered, patient is agreeable to proceed.  The procedure is scheduled with general anesthesia Thursday, 09/08/22 at 11:30 am. She will be NPO at midnight the day of the procedure.   Consent signed and in chart.  Electronically Signed: Soyla Dryer, AGACNP-BC [id number] 09/06/2022, 4:02 PM   I spent a total of 15 Minutes at the the patient's bedside AND on the patient's hospital floor or unit, greater than 50% of which was counseling/coordinating care for diagnostic cerebral angiogram with possible intervention.

## 2022-09-06 NOTE — TOC Benefit Eligibility Note (Signed)
Patient Advocate Encounter  Insurance verification completed.    The patient is currently admitted and upon discharge could be taking Brilinta 90 mg.  The current 30 day co-pay is $0.00.   The patient is insured through [employer] Medicare Part D    Maleya Leever, CPhT Pharmacy Patient Advocate Specialist [facility] Patient Advocate Team Direct Number: [phone]  Fax: [phone]        

## 2022-09-06 NOTE — Progress Notes (Signed)
[facility] PROGRESS NOTE    Progress Note  Rita [name]  KGM:[id number] DOB: 08-23-1966 DOA: 09/01/2022 PCP: [facility]     Brief Narrative:   Rita [name] is an 56 y.o. female past medical history of chronic back pain, chronic abdominal pain, COPD, stroke, essential hypertension hyperlipidemia presents with right arm weakness when she woke up on 08/31/2022 in the ED she was found to be afebrile hemoglobin of 13 white blood cell count of 9 MRI of the brain showed multiple acute infarcts in the frontal and parietal lobe, MRI of the head was negative for large vessel occlusion neurologist was consulted recommended transfer to [facility]   Assessment/Plan:   Acute ischemic stroke multiple acute infarcts in the left frontal and parietal skull: CTA of the head showed occluded left vertebral artery occlusion of the left subclavian artery with distal reconstitution and moderate paraclinoid ICA and right paraclinoid stenosis Neurology was consulted recommended aspirin and Brilinta twice a day for 3 months. He also recommended high-dose statins. IR was consulted recommended transfer to [facility] for carotid stenting on 09/06/2022.  Tobacco abuse: Discussed cessation.  GERD: Continue Protonix.  Impaired glucose intolerance: With an A1c of 6.2 lifestyle modifications and follow-up with PCP.  Right lobe lung mass: Diagnosed in January 2020 lost to follow-up repeated CT scan showed resolution of left lower lobe.  Obesity: Lifestyle modification.  Opioid dependence: Continue buprenorphine.  Mixed hyperlipidemia Continue statins.  Hypokalemia: Replete orally now improved magnesium 1.9.  DVT prophylaxis: lovenox Family Communication:none Status is: Inpatient Remains inpatient appropriate because: Acute CVA    Code Status:     Code Status Orders  (From admission, onward)           Start     Ordered   09/01/22 2043  Full code  Continuous        09/01/22  2043           Code Status History     Date Active Date Inactive Code Status Order ID Comments User Context   01/20/2019 1751 01/24/2019 1914 Full Code [id number]  Bethena Roys, MD ED   03/19/2018 1513 03/21/2018 1419 Full Code [id number]  Ulyses Amor, PA-C Inpatient   02/21/2018 2200 02/26/2018 2113 Full Code [id number]  Vianne Bulls, MD ED   01/19/2018 1312 01/19/2018 1954 Full Code [id number]  Elam Dutch, MD Inpatient   12/30/2016 2336 01/02/2017 2012 Full Code [id number]  Orvan Falconer, MD Inpatient   02/06/2015 0320 02/08/2015 1942 Full Code [id number]  Oswald Hillock, MD Inpatient   03/25/2013 1647 03/29/2013 1615 Full Code [id number]  Doree Albee, MD ED         IV Access:   Peripheral IV   Procedures and diagnostic studies:   No results found.   Medical Consultants:   None.   Subjective:    Rita [name] no complaints today.  Objective:    Vitals:   09/05/22 2000 09/05/22 2347 09/06/22 0356 09/06/22 0739  BP:  106/67 (!) 109/57 130/62  Pulse:  (!) 55 (!) 52 61  Resp:  '15 19 16  '$ Temp: 98.7 F (37.1 C) 97.9 F (36.6 C) 97.6 F (36.4 C) 97.7 F (36.5 C)  TempSrc: Oral Oral Oral Oral  SpO2:  99% 99% 100%  Weight:      Height:       SpO2: 100 %   Intake/Output Summary (Last 24 hours) at 09/06/2022 0755 Last data filed at 09/05/2022 2332 Gross per  24 hour  Intake 640 ml  Output --  Net 640 ml    Filed Weights   09/01/22 1523 09/01/22 2114  Weight: 101.5 kg 102.4 kg    Exam: General exam: In no acute distress. Respiratory system: Good air movement and clear to auscultation. Cardiovascular system: S1 & S2 heard, RRR. No JVD. Gastrointestinal system: Abdomen is nondistended, soft and nontender.  Extremities: No pedal edema. Skin: No rashes, lesions or ulcers Psychiatry: Judgement and insight appear normal. Mood & affect appropriate.  Data Reviewed:    Labs: Basic Metabolic Panel: Recent Labs  Lab 09/02/22 0303 09/03/22 0517  09/04/22 0208 09/05/22 0920 09/06/22 0313  NA 141 140 140 139 139  K 3.0* 2.7* 3.1* 3.6 3.8  CL 104 107 103 110 112*  CO2 '27 25 26 '$ 17* 21*  GLUCOSE 103* 128* 131* 112* 121*  BUN 10 11 5* 8 12  CREATININE 0.78 0.91 0.94 0.92 1.15*  CALCIUM 8.8* 8.9 8.9 8.7* 8.7*  MG 1.8  --  1.9  --   --   PHOS 3.3  --   --   --   --     GFR Estimated Creatinine Clearance: 67.2 mL/min (A) (by C-G formula based on SCr of 1.15 mg/dL (H)). Liver Function Tests: Recent Labs  Lab 09/01/22 1605 09/02/22 0303 09/05/22 0920  AST '20 19 19  '$ ALT '25 25 25  '$ ALKPHOS 78 74 72  BILITOT 0.6 0.6 0.4  PROT 7.3 7.1 6.3*  ALBUMIN 3.9 3.7 3.4*    No results for input(s): "LIPASE", "AMYLASE" in the last 168 hours. No results for input(s): "AMMONIA" in the last 168 hours. Coagulation profile Recent Labs  Lab 09/01/22 1605 09/01/22 2128 09/06/22 0313  INR 0.9 0.9 1.0    COVID-19 Labs  Recent Labs    09/04/22 0946  LDH 139     No results found for: "SARSCOV2NAA"  CBC: Recent Labs  Lab 09/01/22 2127 09/01/22 2128 09/02/22 0303 09/03/22 0517 09/06/22 0313  WBC 10.5 TEST WILL BE CREDITED 9.4 9.2 8.7  NEUTROABS 6.7  --   --   --   --   HGB 13.9 TEST WILL BE CREDITED 13.5 13.8 12.5  HCT 41.9 TEST WILL BE CREDITED 41.2 41.2 37.2  MCV 87.5 TEST WILL BE CREDITED 86.9 86.6 86.7  PLT 350 TEST WILL BE CREDITED 362 345 332    Cardiac Enzymes: No results for input(s): "CKTOTAL", "CKMB", "CKMBINDEX", "TROPONINI" in the last 168 hours. BNP (last 3 results) No results for input(s): "PROBNP" in the last 8760 hours. CBG: Recent Labs  Lab 09/01/22 1526  GLUCAP 154*    D-Dimer: No results for input(s): "DDIMER" in the last 72 hours. Hgb A1c: No results for input(s): "HGBA1C" in the last 72 hours.  Lipid Profile: No results for input(s): "CHOL", "HDL", "LDLCALC", "TRIG", "CHOLHDL", "LDLDIRECT" in the last 72 hours.  Thyroid function studies: No results for input(s): "TSH", "T4TOTAL",  "T3FREE", "THYROIDAB" in the last 72 hours.  Invalid input(s): "FREET3" Anemia work up: No results for input(s): "VITAMINB12", "FOLATE", "FERRITIN", "TIBC", "IRON", "RETICCTPCT" in the last 72 hours. Sepsis Labs: Recent Labs  Lab 09/01/22 2128 09/02/22 0303 09/03/22 0517 09/04/22 0946 09/06/22 0313  WBC TEST WILL BE CREDITED 9.4 9.2  --  8.7  LATICACIDVEN  --   --   --  1.7  --     Microbiology No results found for this or any previous visit (from the past 240 hour(s)).   Medications:    aspirin  EC  81 mg Oral Daily   atorvastatin  80 mg Oral q1800   buprenorphine-naloxone  1 tablet Sublingual Daily   enoxaparin (LOVENOX) injection  50 mg Subcutaneous Q24H   nicotine  14 mg Transdermal Daily   pantoprazole  40 mg Oral BID   potassium chloride  40 mEq Oral BID   sodium chloride flush  3 mL Intravenous Once   ticagrelor  90 mg Oral BID   Continuous Infusions:    LOS: 4 days   Charlynne Cousins  [facility]  09/06/2022, 7:55 AM

## 2022-09-06 NOTE — Telephone Encounter (Signed)
Pharmacy Patient Advocate Encounter  Insurance verification completed.    The patient is insured through [employer] Medicare Part D   The patient is currently admitted and ran test claims for the following: Brilinta.  Copays and coinsurance results were relayed to Inpatient clinical team.  

## 2022-09-07 ENCOUNTER — Other Ambulatory Visit: Payer: Self-pay | Admitting: Internal Medicine

## 2022-09-07 DIAGNOSIS — E876 Hypokalemia: Secondary | ICD-10-CM | POA: Diagnosis not present

## 2022-09-07 DIAGNOSIS — I671 Cerebral aneurysm, nonruptured: Secondary | ICD-10-CM

## 2022-09-07 DIAGNOSIS — I639 Cerebral infarction, unspecified: Secondary | ICD-10-CM | POA: Diagnosis not present

## 2022-09-07 DIAGNOSIS — K219 Gastro-esophageal reflux disease without esophagitis: Secondary | ICD-10-CM | POA: Diagnosis not present

## 2022-09-07 LAB — BASIC METABOLIC PANEL
Anion gap: 7 (ref 5–15)
BUN: 13 mg/dL (ref 6–20)
CO2: 21 mmol/L — ABNORMAL LOW (ref 22–32)
Calcium: 8.7 mg/dL — ABNORMAL LOW (ref 8.9–10.3)
Chloride: 111 mmol/L (ref 98–111)
Creatinine, Ser: 1.14 mg/dL — ABNORMAL HIGH (ref 0.44–1.00)
GFR, Estimated: 56 mL/min — ABNORMAL LOW (ref >60)
Glucose, Bld: 122 mg/dL — ABNORMAL HIGH (ref 70–99)
Potassium: 3.4 mmol/L — ABNORMAL LOW (ref 3.5–5.1)
Sodium: 139 mmol/L (ref 135–145)

## 2022-09-07 MED ORDER — POTASSIUM CHLORIDE CRYS ER 20 MEQ PO TBCR
40.0000 meq | EXTENDED_RELEASE_TABLET | Freq: Once | ORAL | Status: AC
  Administered 2022-09-07: 40 meq via ORAL
  Filled 2022-09-07: qty 2

## 2022-09-07 NOTE — Progress Notes (Signed)
Occupational Therapy Treatment Patient Details Name: Rita [name] MRN: [id number] DOB: 1966-02-08 Today's Date: 09/07/2022   History of present illness Pt is a 56 y/o female who presented to the ED 09/01/22 with right-sided weakness and slurred speech. MRI revealed multiple acute infarcts in the high L frontal and parietal lobes, potentially watershed. PMH significant of hyperlipidemia, Plavix, history of left occipital CVA without residual deficits (2020), tobacco abuse.   OT comments  Pt progressing well. Worked on R UE functional use with ADLs (snaps, grooming) as well as with writing during visual scanning activity.  Up to min cueing for visual scanning in hallway and to locate letters on mirror.  Pt educated on compensatory techniques for visual deficits, provided further handouts to work on scanning.  Will follow acutely.     Recommendations for follow up therapy are one component of a multi-disciplinary discharge planning process, led by the attending physician.  Recommendations may be updated based on patient status, additional functional criteria and insurance authorization.    Follow Up Recommendations  Outpatient OT    Assistance Recommended at Discharge Set up Supervision/Assistance  Patient can return home with the following  Assistance with cooking/housework;Assist for transportation;Assistance with feeding   Equipment Recommendations  None recommended by OT    Recommendations for Other Services      Precautions / Restrictions Precautions Precautions: Fall Precaution Comments: R lower quadrant visual deficits Restrictions Weight Bearing Restrictions: No       Mobility Bed Mobility Overal bed mobility: Modified Independent                  Transfers                         Balance Overall balance assessment: Mild deficits observed, not formally tested                                         ADL either performed or assessed  with clinical judgement   ADL Overall ADL's : Needs assistance/impaired     Grooming: Wash/dry hands;Standing;Modified independent           Upper Body Dressing : Modified independent;Standing Upper Body Dressing Details (indicate cue type and reason): donning 2nd gown, also worked on Artist with B Consulting civil engineer: Modified Independent;Ambulation   Toileting- Clothing Manipulation and Hygiene: Modified independent;Sit to/from stand;Sitting/lateral lean       Functional mobility during ADLs: Supervision/safety;Cueing for safety      Extremity/Trunk Assessment              Vision       Perception     Praxis      Cognition Arousal/Alertness: Awake/alert Behavior During Therapy: WFL for tasks assessed/performed Overall Cognitive Status: Within Functional Limits for tasks assessed                                          Exercises Exercises: Other exercises Other Exercises Other Exercises: Completed visual scanning on mirror, sequencing letteres while using R hand to mark them out. Other Exercises: provided pen/paper visual scanning worksheets    Shoulder Instructions       General Comments reviewed compensatory techniques for visual deficits, scanning, friend walking on R side, anchors  with reading    Pertinent Vitals/ Pain       Pain Assessment Pain Assessment: Faces Faces Pain Scale: No hurt  Home Living                                          Prior Functioning/Environment              Frequency  Min 2X/week        Progress Toward Goals  OT Goals(current goals can now be found in the care plan section)  Progress towards OT goals: Progressing toward goals  Acute Rehab OT Goals Patient Stated Goal: use my R hand OT Goal Formulation: With patient Time For Goal Achievement: 09/16/22 Potential to Achieve Goals: Good  Plan Discharge plan remains appropriate;Frequency needs to be  updated    Co-evaluation                 AM-PAC OT "6 Clicks" Daily Activity     Outcome Measure   Help from another person eating meals?: None Help from another person taking care of personal grooming?: None Help from another person toileting, which includes using toliet, bedpan, or urinal?: None Help from another person bathing (including washing, rinsing, drying)?: None Help from another person to put on and taking off regular upper body clothing?: None Help from another person to put on and taking off regular lower body clothing?: None 6 Click Score: 24    End of Session    OT Visit Diagnosis: Muscle weakness (generalized) (M62.81);Hemiplegia and hemiparesis;Low vision, both eyes (H54.2) Hemiplegia - Right/Left: Right Hemiplegia - dominant/non-dominant: Dominant Hemiplegia - caused by: Cerebral infarction   Activity Tolerance Patient tolerated treatment well   Patient Left in bed;with call bell/phone within reach   Nurse Communication Mobility status        Time: 8841-6606 OT Time Calculation (min): 33 min  Charges: OT General Charges $OT Visit: 1 Visit OT Treatments $Neuromuscular Re-education: 23-37 mins  Jolaine Artist, [place] Office [phone]   Delight Stare 09/07/2022, 9:53 AM

## 2022-09-07 NOTE — Progress Notes (Signed)
[facility]  PROGRESS NOTE  MICHAELYN WALL KNL:[id number] DOB: Feb 20, 1966 DOA: 09/01/2022 PCP: [facility]   Brief HPI:   56 year old female with history of Barrett's esophagus, chronic back pain, chronic abdominal pain, COPD, stroke, hypertension, hyperlipidemia presented with right arm weakness when she woke up on 08/31/2022.  In the ED she was found to be afebrile with hemoglobin of 13, WBC 9.  MRI brain showed multiple acute infarcts in the frontal and parietal lobe.  CT of the head showed occluded left vertebral artery occlusion of the left subclavian artery with distal reconstitution and moderate paraclinoid ICA and right paraclinoid stenosis.  neurologist recommended transfer to [facility].    Subjective   Patient seen and examined, underwent cerebral angiogram on 09/06/2022 showed saccular aneurysm in the left internal carotid artery.   Assessment/Plan:   Acute ischemic stroke -Multiple infarcts in the left frontal and parietal lobes -CTA of the head showed occluded left vertebral artery, occlusion of the left subclavian artery with distal reconstitution and moderate paraclinoid ICA and right paranoid stenosis -Neurology was consulted, recommended aspirin and Brilinta twice daily for 3 months -Also recommended high-dose statins -IR was consulted, recommended transfer to [facility] for carotid artery stenting -We will consult neurology  Impaired glucose tolerance -Hemoglobin A1c 6.2 -Lifestyle modification follow-up with PCP as outpatient  Right lobe mass -Diagnosed in January 2020; lost to follow-up -Repeat CT chest showed resolution of right lower lobe mass  Opiate dependence -Continue buprenorphine Patient also started on morphine 4 mg IV every 4 hours as needed for back pain  Hyperlipidemia -Continue statin  Hypokalemia -Potassium is 3.4 today -We will replace potassium and follow BMP in am     Medications     aspirin EC  81 mg Oral Daily    atorvastatin  80 mg Oral q1800   buprenorphine-naloxone  1 tablet Sublingual Daily   enoxaparin (LOVENOX) injection  50 mg Subcutaneous Q24H   nicotine  14 mg Transdermal Daily   pantoprazole  40 mg Oral BID   sodium chloride flush  3 mL Intravenous Once   ticagrelor  90 mg Oral BID     Data Reviewed:   CBG:  Recent Labs  Lab 09/01/22 1526  GLUCAP 154*    SpO2: 99 % O2 Flow Rate (L/min): 2 L/min    Vitals:   09/06/22 2007 09/07/22 0035 09/07/22 0402 09/07/22 0822  BP: 110/71 114/69 (!) 135/57 112/67  Pulse: (!) 59 (!) 57 (!) 56 (!) 58  Resp: '16 16 18 18  '$ Temp: 99.7 F (37.6 C) 98.7 F (37.1 C) 98.5 F (36.9 C)   TempSrc: Axillary Oral Oral   SpO2: 99% 100% 99% 99%  Weight:      Height:          Data Reviewed:  Basic Metabolic Panel: Recent Labs  Lab 09/02/22 0303 09/03/22 0517 09/04/22 0208 09/05/22 0920 09/06/22 0313 09/07/22 0345  NA 141 140 140 139 139 139  K 3.0* 2.7* 3.1* 3.6 3.8 3.4*  CL 104 107 103 110 112* 111  CO2 '27 25 26 '$ 17* 21* 21*  GLUCOSE 103* 128* 131* 112* 121* 122*  BUN 10 11 5* '8 12 13  '$ CREATININE 0.78 0.91 0.94 0.92 1.15* 1.14*  CALCIUM 8.8* 8.9 8.9 8.7* 8.7* 8.7*  MG 1.8  --  1.9  --   --   --   PHOS 3.3  --   --   --   --   --  CBC: Recent Labs  Lab 09/01/22 2127 09/01/22 2128 09/02/22 0303 09/03/22 0517 09/06/22 0313  WBC 10.5 TEST WILL BE CREDITED 9.4 9.2 8.7  NEUTROABS 6.7  --   --   --   --   HGB 13.9 TEST WILL BE CREDITED 13.5 13.8 12.5  HCT 41.9 TEST WILL BE CREDITED 41.2 41.2 37.2  MCV 87.5 TEST WILL BE CREDITED 86.9 86.6 86.7  PLT 350 TEST WILL BE CREDITED 362 345 332    LFT Recent Labs  Lab 09/01/22 1605 09/02/22 0303 09/05/22 0920  AST '20 19 19  '$ ALT '25 25 25  '$ ALKPHOS 78 74 72  BILITOT 0.6 0.6 0.4  PROT 7.3 7.1 6.3*  ALBUMIN 3.9 3.7 3.4*     Antibiotics: Anti-infectives (From admission, onward)    Start     Dose/Rate Route Frequency Ordered Stop   09/08/22 1100  ceFAZolin (ANCEF) IVPB  2g/100 mL premix        2 g 200 mL/hr over 30 Minutes Intravenous To Radiology 09/06/22 1606 09/09/22 1100        DVT prophylaxis: Lovenox  Code Status: Full code  Family Communication:    CONSULTS    Objective    Physical Examination:   General-appears in no acute distress Heart-S1-S2, regular, no murmur auscultated Lungs-clear to auscultation bilaterally, no wheezing or crackles auscultated Abdomen-soft, nontender, no organomegaly Extremities-no edema in the lower extremities Neuro-alert, oriented x3, motor strength 4/5 in right upper extremity  Status is: Inpatient:             Oswald Hillock   [facility] If 7PM-7AM, please contact night-coverage at [email or web address], Office  [phone]   09/07/2022, 3:24 PM  LOS: 5 days

## 2022-09-07 NOTE — Progress Notes (Signed)
Mobility Specialist: Progress Note   09/07/22 1143  Mobility  Activity Ambulated with assistance in hallway  Level of Assistance Standby assist, set-up cues, supervision of patient - no hands on  Assistive Device None  Distance Ambulated (ft) 480 ft  Activity Response Tolerated well  $Mobility charge 1 Mobility   Post-Mobility: 72 HR, 99% SpO2  Received pt in bed having no complaints and agreeable to mobility. Pt was asymptomatic throughout ambulation and returned to room w/o fault. Left sitting EOB w/ call bell in reach and all needs met.  [facility] [place] Mobility Specialist Mobility Specialist [address]: [phone]

## 2022-09-08 ENCOUNTER — Encounter (HOSPITAL_COMMUNITY): Payer: Self-pay | Admitting: Internal Medicine

## 2022-09-08 ENCOUNTER — Inpatient Hospital Stay (HOSPITAL_COMMUNITY): Payer: Medicare Other

## 2022-09-08 ENCOUNTER — Encounter (HOSPITAL_COMMUNITY)
Admission: EM | Disposition: A | Discharge status: Home / Self Care | Payer: Self-pay | Source: Home / Self Care | Attending: Internal Medicine

## 2022-09-08 ENCOUNTER — Inpatient Hospital Stay (HOSPITAL_COMMUNITY): Payer: Medicare Other | Admitting: Certified Registered"

## 2022-09-08 ENCOUNTER — Other Ambulatory Visit: Payer: Self-pay

## 2022-09-08 DIAGNOSIS — J449 Chronic obstructive pulmonary disease, unspecified: Secondary | ICD-10-CM | POA: Diagnosis not present

## 2022-09-08 DIAGNOSIS — I63232 Cerebral infarction due to unspecified occlusion or stenosis of left carotid arteries: Secondary | ICD-10-CM

## 2022-09-08 DIAGNOSIS — I10 Essential (primary) hypertension: Secondary | ICD-10-CM

## 2022-09-08 DIAGNOSIS — F17210 Nicotine dependence, cigarettes, uncomplicated: Secondary | ICD-10-CM

## 2022-09-08 DIAGNOSIS — I671 Cerebral aneurysm, nonruptured: Secondary | ICD-10-CM | POA: Diagnosis not present

## 2022-09-08 DIAGNOSIS — I6522 Occlusion and stenosis of left carotid artery: Secondary | ICD-10-CM | POA: Diagnosis present

## 2022-09-08 DIAGNOSIS — K219 Gastro-esophageal reflux disease without esophagitis: Secondary | ICD-10-CM | POA: Diagnosis not present

## 2022-09-08 DIAGNOSIS — E876 Hypokalemia: Secondary | ICD-10-CM | POA: Diagnosis not present

## 2022-09-08 DIAGNOSIS — I639 Cerebral infarction, unspecified: Secondary | ICD-10-CM | POA: Diagnosis not present

## 2022-09-08 HISTORY — PX: IR CT HEAD LTD: IMG2386

## 2022-09-08 HISTORY — PX: IR TRANSCATH/EMBOLIZ: IMG695

## 2022-09-08 HISTORY — PX: IR US GUIDE VASC ACCESS RIGHT: IMG2390

## 2022-09-08 HISTORY — PX: RADIOLOGY WITH ANESTHESIA: SHX6223

## 2022-09-08 LAB — BASIC METABOLIC PANEL
Anion gap: 13 (ref 5–15)
BUN: 12 mg/dL (ref 6–20)
CO2: 22 mmol/L (ref 22–32)
Calcium: 9.4 mg/dL (ref 8.9–10.3)
Chloride: 106 mmol/L (ref 98–111)
Creatinine, Ser: 1.1 mg/dL — ABNORMAL HIGH (ref 0.44–1.00)
GFR, Estimated: 59 mL/min — ABNORMAL LOW (ref >60)
Glucose, Bld: 116 mg/dL — ABNORMAL HIGH (ref 70–99)
Potassium: 3.3 mmol/L — ABNORMAL LOW (ref 3.5–5.1)
Sodium: 141 mmol/L (ref 135–145)

## 2022-09-08 LAB — CBC WITH DIFFERENTIAL/PLATELET
Abs Immature Granulocytes: 0.02 10*3/uL (ref 0.00–0.07)
Basophils Absolute: 0 10*3/uL (ref 0.0–0.1)
Basophils Relative: 1 %
Eosinophils Absolute: 0.2 10*3/uL (ref 0.0–0.5)
Eosinophils Relative: 3 %
HCT: 36.6 % (ref 36.0–46.0)
Hemoglobin: 12.1 g/dL (ref 12.0–15.0)
Immature Granulocytes: 0 %
Lymphocytes Relative: 43 %
Lymphs Abs: 3.2 10*3/uL (ref 0.7–4.0)
MCH: 28.5 pg (ref 26.0–34.0)
MCHC: 33.1 g/dL (ref 30.0–36.0)
MCV: 86.1 fL (ref 80.0–100.0)
Monocytes Absolute: 0.4 10*3/uL (ref 0.1–1.0)
Monocytes Relative: 6 %
Neutro Abs: 3.5 10*3/uL (ref 1.7–7.7)
Neutrophils Relative %: 47 %
Platelets: 345 10*3/uL (ref 150–400)
RBC: 4.25 MIL/uL (ref 3.87–5.11)
RDW: 14.4 % (ref 11.5–15.5)
WBC: 7.4 10*3/uL (ref 4.0–10.5)
nRBC: 0 % (ref 0.0–0.2)

## 2022-09-08 LAB — PROTIME-INR
INR: 0.9 (ref 0.8–1.2)
Prothrombin Time: 12.3 seconds (ref 11.4–15.2)

## 2022-09-08 SURGERY — IR WITH ANESTHESIA
Anesthesia: General

## 2022-09-08 MED ORDER — CHLORHEXIDINE GLUCONATE CLOTH 2 % EX PADS: 6.0000 | MEDICATED_PAD | Freq: Every day | CUTANEOUS | Status: DC

## 2022-09-08 MED ORDER — ORAL CARE MOUTH RINSE: 15.0000 mL | Freq: Once | OROMUCOSAL | Status: AC

## 2022-09-08 MED ORDER — HEPARIN (PORCINE) 25000 UT/250ML-% IV SOLN
INTRAVENOUS | Status: AC
  Filled 2022-09-08: qty 250

## 2022-09-08 MED ORDER — ONDANSETRON HCL 4 MG/2ML IJ SOLN
INTRAMUSCULAR | Status: DC | PRN
  Administered 2022-09-08: 4 mg via INTRAVENOUS

## 2022-09-08 MED ORDER — POTASSIUM CHLORIDE CRYS ER 20 MEQ PO TBCR: 40.0000 meq | EXTENDED_RELEASE_TABLET | Freq: Once | ORAL | Status: DC

## 2022-09-08 MED ORDER — LIDOCAINE 2% (20 MG/ML) 5 ML SYRINGE
INTRAMUSCULAR | Status: DC | PRN
  Administered 2022-09-08: 100 mg via INTRAVENOUS

## 2022-09-08 MED ORDER — TICAGRELOR 90 MG PO TABS
90.0000 mg | ORAL_TABLET | Freq: Two times a day (BID) | ORAL | Status: DC
  Administered 2022-09-08 – 2022-09-09 (×2): 90 mg via ORAL
  Filled 2022-09-08 (×2): qty 1

## 2022-09-08 MED ORDER — TICAGRELOR 90 MG PO TABS: 90.0000 mg | ORAL_TABLET | Freq: Once | ORAL | Status: DC

## 2022-09-08 MED ORDER — LIDOCAINE HCL 1 % IJ SOLN
INTRAMUSCULAR | Status: AC
  Filled 2022-09-08: qty 20

## 2022-09-08 MED ORDER — MEPERIDINE HCL 25 MG/ML IJ SOLN: 6.2500 mg | INTRAMUSCULAR | Status: DC | PRN

## 2022-09-08 MED ORDER — EPTIFIBATIDE 20 MG/10ML IV SOLN
INTRAVENOUS | Status: AC
  Filled 2022-09-08: qty 10

## 2022-09-08 MED ORDER — FENTANYL CITRATE (PF) 100 MCG/2ML IJ SOLN
25.0000 ug | INTRAMUSCULAR | Status: DC | PRN
  Administered 2022-09-08 (×2): 50 ug via INTRAVENOUS

## 2022-09-08 MED ORDER — PHENYLEPHRINE HCL-NACL 20-0.9 MG/250ML-% IV SOLN
INTRAVENOUS | Status: DC | PRN
  Administered 2022-09-08: 50 ug/min via INTRAVENOUS

## 2022-09-08 MED ORDER — PROPOFOL 10 MG/ML IV BOLUS
INTRAVENOUS | Status: DC | PRN
  Administered 2022-09-08: 150 mg via INTRAVENOUS

## 2022-09-08 MED ORDER — NITROGLYCERIN 1 MG/10 ML FOR IR/CATH LAB
INTRA_ARTERIAL | Status: AC | PRN
  Administered 2022-09-08: 200 ug

## 2022-09-08 MED ORDER — ACETAMINOPHEN 160 MG/5ML PO SOLN: 325.0000 mg | ORAL | Status: DC | PRN

## 2022-09-08 MED ORDER — MIDAZOLAM HCL 2 MG/2ML IJ SOLN
INTRAMUSCULAR | Status: AC
  Filled 2022-09-08: qty 2

## 2022-09-08 MED ORDER — SODIUM CHLORIDE 0.9 % IV SOLN: INTRAVENOUS | Status: DC

## 2022-09-08 MED ORDER — POTASSIUM CHLORIDE CRYS ER 20 MEQ PO TBCR
40.0000 meq | EXTENDED_RELEASE_TABLET | Freq: Once | ORAL | Status: AC
  Administered 2022-09-08: 40 meq via ORAL
  Filled 2022-09-08: qty 2

## 2022-09-08 MED ORDER — HEPARIN SODIUM (PORCINE) 1000 UNIT/ML IJ SOLN
INTRAMUSCULAR | Status: DC | PRN
  Administered 2022-09-08 (×2): 1000 [IU] via INTRAVENOUS

## 2022-09-08 MED ORDER — CHLORHEXIDINE GLUCONATE 0.12 % MT SOLN
15.0000 mL | Freq: Once | OROMUCOSAL | Status: AC
  Filled 2022-09-08: qty 15

## 2022-09-08 MED ORDER — EPHEDRINE SULFATE-NACL 50-0.9 MG/10ML-% IV SOSY
PREFILLED_SYRINGE | INTRAVENOUS | Status: DC | PRN
  Administered 2022-09-08 (×2): 5 mg via INTRAVENOUS

## 2022-09-08 MED ORDER — MIDAZOLAM HCL 5 MG/5ML IJ SOLN
INTRAMUSCULAR | Status: DC | PRN
  Administered 2022-09-08: 2 mg via INTRAVENOUS

## 2022-09-08 MED ORDER — IOHEXOL 300 MG/ML  SOLN
100.0000 mL | Freq: Once | INTRAMUSCULAR | Status: AC | PRN
  Administered 2022-09-08: 80 mL via INTRA_ARTERIAL

## 2022-09-08 MED ORDER — ROCURONIUM BROMIDE 10 MG/ML (PF) SYRINGE
PREFILLED_SYRINGE | INTRAVENOUS | Status: DC | PRN
  Administered 2022-09-08: 20 mg via INTRAVENOUS
  Administered 2022-09-08: 80 mg via INTRAVENOUS

## 2022-09-08 MED ORDER — OXYCODONE HCL 5 MG PO TABS: 5.0000 mg | ORAL_TABLET | Freq: Once | ORAL | Status: DC | PRN

## 2022-09-08 MED ORDER — PROPOFOL 10 MG/ML IV BOLUS
INTRAVENOUS | Status: AC
  Filled 2022-09-08: qty 20

## 2022-09-08 MED ORDER — NITROGLYCERIN 1 MG/10 ML FOR IR/CATH LAB
INTRA_ARTERIAL | Status: AC
  Filled 2022-09-08: qty 10

## 2022-09-08 MED ORDER — HEPARIN SODIUM (PORCINE) 1000 UNIT/ML IJ SOLN
INTRAMUSCULAR | Status: AC
  Filled 2022-09-08: qty 10

## 2022-09-08 MED ORDER — NITROGLYCERIN 1 MG/10 ML FOR IR/CATH LAB: INTRA_ARTERIAL | Status: AC | PRN

## 2022-09-08 MED ORDER — ACETAMINOPHEN 325 MG PO TABS: 325.0000 mg | ORAL_TABLET | ORAL | Status: DC | PRN

## 2022-09-08 MED ORDER — HEPARIN (PORCINE) 25000 UT/250ML-% IV SOLN
500.0000 [IU]/h | INTRAVENOUS | Status: DC
  Administered 2022-09-08: 500 [IU]/h via INTRAVENOUS

## 2022-09-08 MED ORDER — OXYCODONE HCL 5 MG/5ML PO SOLN: 5.0000 mg | Freq: Once | ORAL | Status: DC | PRN

## 2022-09-08 MED ORDER — PHENYLEPHRINE 80 MCG/ML (10ML) SYRINGE FOR IV PUSH (FOR BLOOD PRESSURE SUPPORT)
PREFILLED_SYRINGE | INTRAVENOUS | Status: DC | PRN
  Administered 2022-09-08: 80 ug via INTRAVENOUS
  Administered 2022-09-08: 160 ug via INTRAVENOUS

## 2022-09-08 MED ORDER — ESMOLOL HCL 100 MG/10ML IV SOLN
INTRAVENOUS | Status: DC | PRN
  Administered 2022-09-08: 30 mg via INTRAVENOUS

## 2022-09-08 MED ORDER — ASPIRIN 81 MG PO CHEW: 81.0000 mg | CHEWABLE_TABLET | Freq: Every day | ORAL | Status: DC

## 2022-09-08 MED ORDER — SUGAMMADEX SODIUM 200 MG/2ML IV SOLN
INTRAVENOUS | Status: DC | PRN
  Administered 2022-09-08: 200 mg via INTRAVENOUS

## 2022-09-08 MED ORDER — EPTIFIBATIDE 20 MG/10ML IV SOLN
INTRAVENOUS | Status: AC | PRN
  Administered 2022-09-08: 1.5 ug via INTRAVENOUS
  Administered 2022-09-08: 1.5 mg via INTRAVENOUS

## 2022-09-08 MED ORDER — HEPARIN (PORCINE) 25000 UT/250ML-% IV SOLN
650.0000 [IU]/h | INTRAVENOUS | Status: DC
  Administered 2022-09-08: 650 [IU]/h via INTRAVENOUS
  Filled 2022-09-08: qty 250

## 2022-09-08 MED ORDER — VERAPAMIL HCL 2.5 MG/ML IV SOLN: INTRA_ARTERIAL | Status: AC | PRN

## 2022-09-08 MED ORDER — FENTANYL CITRATE (PF) 100 MCG/2ML IJ SOLN
INTRAMUSCULAR | Status: AC
  Filled 2022-09-08: qty 2

## 2022-09-08 MED ORDER — CEFAZOLIN SODIUM-DEXTROSE 2-3 GM-%(50ML) IV SOLR
INTRAVENOUS | Status: DC | PRN
  Administered 2022-09-08: 2 g via INTRAVENOUS

## 2022-09-08 MED ORDER — VERAPAMIL HCL 2.5 MG/ML IV SOLN
INTRAVENOUS | Status: AC
  Filled 2022-09-08: qty 2

## 2022-09-08 MED ORDER — CLEVIDIPINE BUTYRATE 0.5 MG/ML IV EMUL
INTRAVENOUS | Status: AC
  Filled 2022-09-08: qty 50

## 2022-09-08 MED ORDER — LACTATED RINGERS IV SOLN: INTRAVENOUS | Status: DC | PRN

## 2022-09-08 MED ORDER — ONDANSETRON HCL 4 MG/2ML IJ SOLN: 4.0000 mg | Freq: Once | INTRAMUSCULAR | Status: DC | PRN

## 2022-09-08 MED ORDER — TICAGRELOR 90 MG PO TABS: 90.0000 mg | ORAL_TABLET | Freq: Two times a day (BID) | ORAL | Status: DC

## 2022-09-08 MED ORDER — CHLORHEXIDINE GLUCONATE 0.12 % MT SOLN
OROMUCOSAL | Status: AC
  Administered 2022-09-08: 15 mL via OROMUCOSAL
  Filled 2022-09-08: qty 15

## 2022-09-08 MED ORDER — CLEVIDIPINE BUTYRATE 0.5 MG/ML IV EMUL
0.0000 mg/h | INTRAVENOUS | Status: AC
  Administered 2022-09-08: 2 mg/h via INTRAVENOUS

## 2022-09-08 NOTE — Anesthesia Postprocedure Evaluation (Signed)
Anesthesia Post Note  Patient: Rita [name]  Procedure(s) Performed: L ICA Aneurysm Embolazation     Patient location during evaluation: PACU Anesthesia Type: General Level of consciousness: awake and alert Pain management: pain level controlled Vital Signs Assessment: post-procedure vital signs reviewed and stable Respiratory status: spontaneous breathing, nonlabored ventilation, respiratory function stable and patient connected to nasal cannula oxygen Cardiovascular status: blood pressure returned to baseline and stable Postop Assessment: no apparent nausea or vomiting Anesthetic complications: no   No notable events documented.  Last Vitals:  Vitals:   09/08/22 1029 09/08/22 1515  BP: (!) 124/57 (!) 158/83  Pulse: (!) 59 73  Resp:  13  Temp: 36.6 C 36.7 C  SpO2: 97% 99%    Last Pain:  Vitals:   09/08/22 1029  TempSrc: Oral  PainSc: 2                  Travarus Trudo

## 2022-09-08 NOTE — Progress Notes (Signed)
ANTICOAGULATION CONSULT NOTE Pharmacy Consult for Heparin  Indication: Neuro IR  No Known Allergies  Patient Measurements: Height: '5\' 7"'$  (170.2 cm) Weight: 102.4 kg (225 lb 12 oz) IBW/kg (Calculated) : 61.6  Vital Signs: Temp: 98.1 F (36.7 C) (09/14 1515) Temp Source: Oral (09/14 1029) BP: 158/83 (09/14 1515) Pulse Rate: 73 (09/14 1515)  Labs: Recent Labs    09/06/22 0313 09/07/22 0345 09/08/22 0455  HGB 12.5  --  12.1  HCT 37.2  --  36.6  PLT 332  --  345  LABPROT 13.4  --  12.3  INR 1.0  --  0.9  CREATININE 1.15* 1.14* 1.10*    Estimated Creatinine Clearance: 70.2 mL/min (A) (by C-G formula based on SCr of 1.1 mg/dL (H)).   Assessment: 56 year old female to begin heparin s/p neuro IR procedure  Goal of Therapy:  Heparin level 0.1-0.25 Monitor platelets by anticoagulation protocol: Yes   Plan:  Heparin to 650 units / hr Heparin level in 8 hours Heparin stops in 24 hours  Thank you Anette Guarneri, PharmD 09/08/2022,3:30 PM

## 2022-09-08 NOTE — Progress Notes (Signed)
HPI:  The patient has had a H&P performed within the last 30 days, all history, medications, and exam have been reviewed. The patient denies any interval changes since the H&P.  Pt feels well this am. Walking halls with family. No new c/o Has been NPO this am.  Medications:  Current Facility-Administered Medications:    acetaminophen (TYLENOL) tablet 650 mg, 650 mg, Oral, Q6H PRN, Tat, David, MD, 650 mg at 09/06/22 0357   alum & mag hydroxide-simeth (MAALOX/MYLANTA) 200-200-20 MG/5ML suspension 15 mL, 15 mL, Oral, Q6H PRN, Tat, David, MD, 15 mL at 09/04/22 3536   aspirin EC tablet 81 mg, 81 mg, Oral, Daily, Tat, David, MD, 81 mg at 09/07/22 0908   atorvastatin (LIPITOR) tablet 80 mg, 80 mg, Oral, q1800, Tat, Shanon Brow, MD, 80 mg at 09/07/22 1709   buprenorphine-naloxone (SUBOXONE) 8-2 mg per SL tablet 1 tablet, 1 tablet, Sublingual, Daily, Tat, David, MD, 1 tablet at 09/07/22 0908   ceFAZolin (ANCEF) IVPB 2g/100 mL premix, 2 g, Intravenous, to XRAY, [place], Roselyn Reef R, NP   enoxaparin (LOVENOX) injection 50 mg, 50 mg, Subcutaneous, Q24H, Tat, David, MD, 50 mg at 09/07/22 2222   morphine (PF) 2 MG/ML injection 4 mg, 4 mg, Intravenous, Q4H PRN, Charlynne Cousins, MD, 4 mg at 09/08/22 1443   nicotine (NICODERM CQ - dosed in mg/24 hours) patch 14 mg, 14 mg, Transdermal, Daily, Tat, David, MD, 14 mg at 09/07/22 0908   ondansetron (ZOFRAN) injection 4 mg, 4 mg, Intravenous, Q6H PRN, Tat, Shanon Brow, MD, 4 mg at 09/07/22 1429   pantoprazole (PROTONIX) EC tablet 40 mg, 40 mg, Oral, BID, Donnamae Jude, RPH, 40 mg at 09/07/22 2222   sodium chloride flush (NS) 0.9 % injection 3 mL, 3 mL, Intravenous, Once, Tat, David, MD   ticagrelor [facility]) tablet 90 mg, 90 mg, Oral, BID, Tat, Shanon Brow, MD, 90 mg at 09/07/22 2222     Vital Signs: BP (!) 140/85 (BP Location: Left Arm)   Pulse (!) 59   Temp 98.1 F (36.7 C) (Oral)   Resp 16   Ht '5\' 7"'$  (1.702 m)   Wt 225 lb 12 oz (102.4 kg)   LMP 02/09/2012    SpO2 100%   BMI 35.36 kg/m   Physical Exam Constitutional:      General: She is not in acute distress.    Appearance: She is not ill-appearing.  Cardiovascular:     Comments: Right groin vascular site is clean, soft, dry and non-tender.  Pulmonary:     Effort: Pulmonary effort is normal.  Skin:    General: Skin is warm and dry.  Neurological:     Mental Status: She is alert.    Mallampati Score:  MD Evaluation Airway: WNL Heart: WNL Abdomen: WNL Chest/ Lungs: WNL ASA  Classification: 3 Mallampati/Airway Score: Two  Labs:  CBC: Recent Labs    09/02/22 0303 09/03/22 0517 09/06/22 0313 09/08/22 0455  WBC 9.4 9.2 8.7 7.4  HGB 13.5 13.8 12.5 12.1  HCT 41.2 41.2 37.2 36.6  PLT 362 345 332 345    COAGS: Recent Labs    09/01/22 1605 09/01/22 2128 09/06/22 0313 09/08/22 0455  INR 0.9 0.9 1.0 0.9  APTT QR 28  --   --     BMP: Recent Labs    09/05/22 0920 09/06/22 0313 09/07/22 0345 09/08/22 0455  NA 139 139 139 141  K 3.6 3.8 3.4* 3.3*  CL 110 112* 111 106  CO2 17* 21* 21* 22  GLUCOSE  112* 121* 122* 116*  BUN '8 12 13 12  '$ CALCIUM 8.7* 8.7* 8.7* 9.4  CREATININE 0.92 1.15* 1.14* 1.10*  GFRNONAA >60 56* 56* 59*    LIVER FUNCTION TESTS: Recent Labs    09/01/22 1605 09/02/22 0303 09/05/22 0920  BILITOT 0.6 0.6 0.4  AST '20 19 19  '$ ALT '25 25 25  '$ ALKPHOS 78 74 72  PROT 7.3 7.1 6.3*  ALBUMIN 3.9 3.7 3.4*    Assessment/Plan:  Left ICA with stenosis and a 2 x 2 mm sacular aneurysm. Plan for angiogram with endovascular treatment with a pipeline flow diverter today. Reviewed procedure and post op expectations. Consent already signed in chart. To receive ASA/Brilinta today at 1000 prior to procedure  Signed: Ascencion Dike 09/08/2022, 9:25 AM

## 2022-09-08 NOTE — Progress Notes (Signed)
TR band removed at [employer]

## 2022-09-08 NOTE — Progress Notes (Addendum)
R radial TR band air removal times: 12 cc to start 1645: 3 cc out, 9 cc left 1715: 3 cc out, 6 cc left 1745: 3 cc out, 3 cc left    Montez Hageman RN

## 2022-09-08 NOTE — Procedures (Signed)
INR. Status post left common carotid arteriogram. Right radial approach. Findings. 1.  Status post endovascular treatment of atherosclerotic ulcerated stenotic lesion of the left internal carotid artery caval cavernous segment associated with a superior hypophyseal region aneurysm with placement of one 4.25 mm x 16 mm pipeline Shield flow diverter device. Post CT of the brain. No hemorrhagic complications.  Wristband applied for hemostasis of the right radial puncture site.  Distal right radial pulse verified to be present. The patient was extubated. Denies any headaches, nausea, vomiting or visual symptoms. Pupils 3 mm equal bilaterally sluggishly reactive.  Tongue midline.  Moves all fours spontaneously equally and to command. Arlean Hopping MD.

## 2022-09-08 NOTE — Transfer of Care (Signed)
Immediate Anesthesia Transfer of Care Note  Patient: Rita [name]  Procedure(s) Performed: L ICA Aneurysm Embolazation  Patient Location: PACU  Anesthesia Type:General  Level of Consciousness: drowsy and patient cooperative  Airway & Oxygen Therapy: Patient Spontanous Breathing  Post-op Assessment: Report given to RN and Post -op Vital signs reviewed and stable  Post vital signs: Reviewed and stable  Last Vitals:  Vitals Value Taken Time  BP 158/83 09/08/22 1515  Temp    Pulse 67 09/08/22 1521  Resp 6 09/08/22 1521  SpO2 99 % 09/08/22 1521  Vitals shown include unvalidated device data.  Last Pain:  Vitals:   09/08/22 1029  TempSrc: Oral  PainSc: 2       Patients Stated Pain Goal: 2 (46/65/99 3570)  Complications: No notable events documented.

## 2022-09-08 NOTE — Anesthesia Procedure Notes (Signed)
Procedure Name: Intubation Date/Time: 09/08/2022 1:02 PM  Performed by: Georgia Duff, CRNAPre-anesthesia Checklist: Patient identified, Emergency Drugs available, Suction available and Patient being monitored Patient Re-evaluated:Patient Re-evaluated prior to induction Oxygen Delivery Method: Circle System Utilized Preoxygenation: Pre-oxygenation with 100% oxygen Induction Type: IV induction Ventilation: Mask ventilation without difficulty Laryngoscope Size: Miller and 2 Grade View: Grade I Tube type: Oral Tube size: 7.0 mm Number of attempts: 1 Airway Equipment and Method: Stylet and Oral airway Placement Confirmation: ETT inserted through vocal cords under direct vision, positive ETCO2 and breath sounds checked- equal and bilateral Tube secured with: Tape Dental Injury: Teeth and Oropharynx as per pre-operative assessment

## 2022-09-08 NOTE — Sedation Documentation (Signed)
Right radial sheath removed. TR band applied with 12 cc air @ 1445.

## 2022-09-08 NOTE — Progress Notes (Signed)
[facility]  PROGRESS NOTE  Rita [name] IFO:[id number] DOB: [birth year] DOA: 09/01/2022 PCP: [facility]   Brief HPI:   56 year old female with history of Barrett's esophagus, chronic back pain, chronic abdominal pain, COPD, stroke, hypertension, hyperlipidemia presented with right arm weakness when she woke up on 08/31/2022.  In the ED she was found to be afebrile with hemoglobin of 13, WBC 9.  MRI brain showed multiple acute infarcts in the frontal and parietal lobe.  CT of the head showed occluded left vertebral artery occlusion of the left subclavian artery with distal reconstitution and moderate paraclinoid ICA and right paraclinoid stenosis.  neurologist recommended transfer to [facility].  cerebral angiogram on 09/06/2022 showed saccular aneurysm in the left internal carotid artery.  Subjective   Patient seen and examined, plan for stenting of left internal carotid artery aneurysm today   Assessment/Plan:   Acute ischemic stroke -Multiple infarcts in the left frontal and parietal lobes -CTA of the head showed occluded left vertebral artery, occlusion of the left subclavian artery with distal reconstitution and moderate paraclinoid ICA and right paranoid stenosis -Neurology was consulted, recommended aspirin and Brilinta twice daily for 3 months -Also recommended high-dose statins -IR was consulted, recommended transfer to [facility] for carotid artery stenting -Plan for stenting of carotid artery aneurysm today; IR following  Impaired glucose tolerance -Hemoglobin A1c 6.2 -Lifestyle modification follow-up with PCP as outpatient  Right lobe mass -Diagnosed in January 2020; lost to follow-up -Repeat CT chest showed resolution of right lower lobe mass  Opiate dependence -Continue buprenorphine Patient also started on morphine 4 mg IV every 4 hours as needed for back pain  Hyperlipidemia -Continue statin  Hypokalemia -Potassium is 3.3 -Replace potassium  and follow BMP in am     Medications     [MAR Hold] aspirin EC  81 mg Oral Daily   [MAR Hold] atorvastatin  80 mg Oral q1800   [MAR Hold] buprenorphine-naloxone  1 tablet Sublingual Daily   [MAR Hold] enoxaparin (LOVENOX) injection  50 mg Subcutaneous Q24H   lidocaine       [MAR Hold] nicotine  14 mg Transdermal Daily   [MAR Hold] pantoprazole  40 mg Oral BID   [MAR Hold] sodium chloride flush  3 mL Intravenous Once   [MAR Hold] ticagrelor  90 mg Oral BID     Data Reviewed:   CBG:  Recent Labs  Lab 09/01/22 1526  GLUCAP 154*    SpO2: 97 % O2 Flow Rate (L/min): 0 L/min    Vitals:   09/08/22 0015 09/08/22 0506 09/08/22 0714 09/08/22 1029  BP: 123/75 126/80 (!) 140/85 (!) 124/57  Pulse: 60 (!) 58 (!) 59 (!) 59  Resp: 17  16   Temp: 98 F (36.7 C) 98.1 F (36.7 C) 98.1 F (36.7 C) 97.9 F (36.6 C)  TempSrc: Oral Oral Oral Oral  SpO2: 99% 99% 100% 97%  Weight:      Height:          Data Reviewed:  Basic Metabolic Panel: Recent Labs  Lab 09/02/22 0303 09/03/22 0517 09/04/22 0208 09/05/22 0920 09/06/22 0313 09/07/22 0345 09/08/22 0455  NA 141   < > 140 139 139 139 141  K 3.0*   < > 3.1* 3.6 3.8 3.4* 3.3*  CL 104   < > 103 110 112* 111 106  CO2 27   < > 26 17* 21* 21* 22  GLUCOSE 103*   < > 131* 112* 121* 122* 116*  BUN 10   < > 5* '8 12 13 12  '$ CREATININE 0.78   < > 0.94 0.92 1.15* 1.14* 1.10*  CALCIUM 8.8*   < > 8.9 8.7* 8.7* 8.7* 9.4  MG 1.8  --  1.9  --   --   --   --   PHOS 3.3  --   --   --   --   --   --    < > = values in this interval not displayed.    CBC: Recent Labs  Lab 09/01/22 2127 09/01/22 2128 09/02/22 0303 09/03/22 0517 09/06/22 0313 09/08/22 0455  WBC 10.5 TEST WILL BE CREDITED 9.4 9.2 8.7 7.4  NEUTROABS 6.7  --   --   --   --  3.5  HGB 13.9 TEST WILL BE CREDITED 13.5 13.8 12.5 12.1  HCT 41.9 TEST WILL BE CREDITED 41.2 41.2 37.2 36.6  MCV 87.5 TEST WILL BE CREDITED 86.9 86.6 86.7 86.1  PLT 350 TEST WILL BE CREDITED 362  345 332 345    LFT Recent Labs  Lab 09/01/22 1605 09/02/22 0303 09/05/22 0920  AST '20 19 19  '$ ALT '25 25 25  '$ ALKPHOS 78 74 72  BILITOT 0.6 0.6 0.4  PROT 7.3 7.1 6.3*  ALBUMIN 3.9 3.7 3.4*     Antibiotics: Anti-infectives (From admission, onward)    Start     Dose/Rate Route Frequency Ordered Stop   09/08/22 1100  [MAR Hold]  ceFAZolin (ANCEF) IVPB 2g/100 mL premix        (MAR Hold since Thu 09/08/2022 at 1022.Hold Reason: Transfer to a Procedural area)   2 g 200 mL/hr over 30 Minutes Intravenous To Radiology 09/06/22 1606 09/09/22 1100        DVT prophylaxis: Lovenox  Code Status: Full code  Family Communication:    CONSULTS    Objective    Physical Examination:   General-appears in no acute distress Heart-S1-S2, regular, no murmur auscultated Lungs-clear to auscultation bilaterally, no wheezing or crackles auscultated Abdomen-soft, nontender, no organomegaly Extremities-no edema in the lower extremities Neuro-alert, oriented x3, no focal deficit noted  Status is: Inpatient:             Oswald Hillock   [facility] If 7PM-7AM, please contact night-coverage at [email or web address], Office  [phone]   09/08/2022, 12:27 PM  LOS: 6 days

## 2022-09-08 NOTE — Anesthesia Preprocedure Evaluation (Addendum)
Anesthesia Evaluation  Patient identified by MRN, date of birth, ID band Patient awake    Reviewed: Allergy & Precautions, H&P , NPO status , Patient's Chart, lab work & pertinent test results  Airway Mallampati: III  TM Distance: >3 FB Neck ROM: Full    Dental no notable dental hx. (+) Edentulous Upper, Edentulous Lower   Pulmonary COPD, Current Smoker and Patient abstained from smoking.,    Pulmonary exam normal breath sounds clear to auscultation       Cardiovascular Exercise Tolerance: Good hypertension, Pt. on medications + Peripheral Vascular Disease  Normal cardiovascular exam Rhythm:Regular Rate:Normal  ECHO 9/23 1. Left ventricular ejection fraction, by estimation, is 60 to 65%. The  left ventricle has normal function. The left ventricle has no regional  wall motion abnormalities. There is mild left ventricular hypertrophy.  Left ventricular diastolic parameters  are consistent with Grade I diastolic dysfunction (impaired relaxation).    Neuro/Psych PSYCHIATRIC DISORDERS Depression Improving weakness R side CVA, Residual Symptoms    GI/Hepatic Neg liver ROS, hiatal hernia, PUD, neg GERD  ,  Endo/Other  negative endocrine ROS  Renal/GU negative Renal ROS  negative genitourinary   Musculoskeletal  (+) Arthritis , Osteoarthritis,    Abdominal   Peds negative pediatric ROS (+)  Hematology  (+) Blood dyscrasia, anemia ,   Anesthesia Other Findings   Reproductive/Obstetrics negative OB ROS                           Anesthesia Physical Anesthesia Plan  ASA: 4  Anesthesia Plan: General   Post-op Pain Management: Minimal or no pain anticipated   Induction: Intravenous  PONV Risk Score and Plan: 2 and Ondansetron and Dexamethasone  Airway Management Planned: Oral ETT  Additional Equipment: Arterial line  Intra-op Plan:   Post-operative Plan: Extubation in OR  Informed  Consent: I have reviewed the patients History and Physical, chart, labs and discussed the procedure including the risks, benefits and alternatives for the proposed anesthesia with the patient or authorized representative who has indicated his/her understanding and acceptance.       Plan Discussed with: Anesthesiologist and CRNA  Anesthesia Plan Comments: (HPI: Rita [name] is a 56 y.o. female with medical history significant of hyperlipidemia, Plavix, history of left occipital CVA without residual deficits (2020), tobacco abuse who presented to the emergency department due to right-sided weakness and slurred speech which started yesterday.  She states that she was in her normal state of health when she went to bed on Tuesday evening and on walking up yesterday (Wednesday) morning, she noted right-sided weakness which was worse in right arm.  Symptoms worsened as the day progressed with worsening slurred speech, weakness and numbness.  She states that she has been compliant with her statin and Plavix, though she endorsed missing some doses occasionally.  Patient continues to smoke 3 to 4 cigarettes daily.  She denies headache, chest pain, shortness of breath, nausea, vomiting, abdominal pain   ED Course:  In the emergency department, she was intermittently bradycardic, BP was 119/80 and other vital signs were within normal range.  Work-up in the ED shows hypokalemia, hyperglycemia, alcohol level was less than 10. CT head without contrast showed no acute intracranial abnormality MRI head without and with contrast showed: 1. Multiple acute infarcts in the high left frontal and parietal lobes, potentially watershed. 2. Motion limited MRA without definite evidence of emergent proximal large vessel occlusion. 3. Particularly limited evaluation of  the PCAs with nondiagnostic evaluation distal to the mid P2 PCAs bilaterally. A CTA could further characterize if clinically warranted. Potassium was  replenished.  Neurologist at [facility] was consulted and recommended the patient can stay at [facility] and to consult with [facility] neurology in the morning per ED PA. )        Anesthesia Quick Evaluation

## 2022-09-09 DIAGNOSIS — I639 Cerebral infarction, unspecified: Secondary | ICD-10-CM | POA: Diagnosis not present

## 2022-09-09 DIAGNOSIS — E876 Hypokalemia: Secondary | ICD-10-CM | POA: Diagnosis not present

## 2022-09-09 DIAGNOSIS — I671 Cerebral aneurysm, nonruptured: Secondary | ICD-10-CM | POA: Diagnosis not present

## 2022-09-09 DIAGNOSIS — I6522 Occlusion and stenosis of left carotid artery: Secondary | ICD-10-CM

## 2022-09-09 LAB — BASIC METABOLIC PANEL
Anion gap: 9 (ref 5–15)
Anion gap: 10 (ref 5–15)
Anion gap: 8 (ref 5–15)
BUN: 12 mg/dL (ref 6–20)
BUN: 10 mg/dL (ref 6–20)
BUN: 11 mg/dL (ref 6–20)
CO2: 20 mmol/L — ABNORMAL LOW (ref 22–32)
CO2: 21 mmol/L — ABNORMAL LOW (ref 22–32)
CO2: 25 mmol/L (ref 22–32)
Calcium: 8.1 mg/dL — ABNORMAL LOW (ref 8.9–10.3)
Calcium: 8.7 mg/dL — ABNORMAL LOW (ref 8.9–10.3)
Calcium: 8.9 mg/dL (ref 8.9–10.3)
Chloride: 111 mmol/L (ref 98–111)
Chloride: 110 mmol/L (ref 98–111)
Chloride: 107 mmol/L (ref 98–111)
Creatinine, Ser: 1.29 mg/dL — ABNORMAL HIGH (ref 0.44–1.00)
Creatinine, Ser: 1 mg/dL (ref 0.44–1.00)
Creatinine, Ser: 0.91 mg/dL (ref 0.44–1.00)
GFR, Estimated: 49 mL/min — ABNORMAL LOW (ref >60)
GFR, Estimated: >60 mL/min (ref >60)
GFR, Estimated: >60 mL/min (ref >60)
Glucose, Bld: 115 mg/dL — ABNORMAL HIGH (ref 70–99)
Glucose, Bld: 105 mg/dL — ABNORMAL HIGH (ref 70–99)
Glucose, Bld: 128 mg/dL — ABNORMAL HIGH (ref 70–99)
Potassium: 3.3 mmol/L — ABNORMAL LOW (ref 3.5–5.1)
Potassium: 3.5 mmol/L (ref 3.5–5.1)
Potassium: 2.7 mmol/L — CL (ref 3.5–5.1)
Sodium: 140 mmol/L (ref 135–145)
Sodium: 141 mmol/L (ref 135–145)
Sodium: 140 mmol/L (ref 135–145)

## 2022-09-09 LAB — CBC WITH DIFFERENTIAL/PLATELET
Abs Immature Granulocytes: 0.02 10*3/uL (ref 0.00–0.07)
Basophils Absolute: 0 10*3/uL (ref 0.0–0.1)
Basophils Relative: 1 %
Eosinophils Absolute: 0.2 10*3/uL (ref 0.0–0.5)
Eosinophils Relative: 3 %
HCT: 34.2 % — ABNORMAL LOW (ref 36.0–46.0)
Hemoglobin: 11.5 g/dL — ABNORMAL LOW (ref 12.0–15.0)
Immature Granulocytes: 0 %
Lymphocytes Relative: 40 %
Lymphs Abs: 2.7 10*3/uL (ref 0.7–4.0)
MCH: 29.2 pg (ref 26.0–34.0)
MCHC: 33.6 g/dL (ref 30.0–36.0)
MCV: 86.8 fL (ref 80.0–100.0)
Monocytes Absolute: 0.5 10*3/uL (ref 0.1–1.0)
Monocytes Relative: 7 %
Neutro Abs: 3.2 10*3/uL (ref 1.7–7.7)
Neutrophils Relative %: 49 %
Platelets: 329 10*3/uL (ref 150–400)
RBC: 3.94 MIL/uL (ref 3.87–5.11)
RDW: 14.1 % (ref 11.5–15.5)
WBC: 6.6 10*3/uL (ref 4.0–10.5)
nRBC: 0 % (ref 0.0–0.2)

## 2022-09-09 LAB — HEPARIN LEVEL (UNFRACTIONATED): Heparin Unfractionated: <0.1 IU/mL — ABNORMAL LOW (ref 0.30–0.70)

## 2022-09-09 MED ORDER — EZETIMIBE 10 MG PO TABS: 10.0000 mg | ORAL_TABLET | Freq: Every day | ORAL | 2 refills | Status: DC

## 2022-09-09 MED ORDER — ASPIRIN 81 MG PO TBEC: 81.0000 mg | DELAYED_RELEASE_TABLET | Freq: Every day | ORAL | 12 refills | Status: DC

## 2022-09-09 MED ORDER — POTASSIUM CHLORIDE CRYS ER 20 MEQ PO TBCR
20.0000 meq | EXTENDED_RELEASE_TABLET | ORAL | Status: AC
  Administered 2022-09-09 (×2): 20 meq via ORAL
  Filled 2022-09-09 (×2): qty 1

## 2022-09-09 MED ORDER — HEPARIN (PORCINE) 25000 UT/250ML-% IV SOLN: 800.0000 [IU]/h | INTRAVENOUS | Status: DC

## 2022-09-09 MED ORDER — EZETIMIBE 10 MG PO TABS
10.0000 mg | ORAL_TABLET | Freq: Every day | ORAL | Status: DC
  Administered 2022-09-09: 10 mg via ORAL
  Filled 2022-09-09: qty 1

## 2022-09-09 MED ORDER — TICAGRELOR 90 MG PO TABS: 90.0000 mg | ORAL_TABLET | Freq: Two times a day (BID) | ORAL | 3 refills | Status: DC

## 2022-09-09 MED ORDER — SODIUM CHLORIDE 0.9 % IV BOLUS
250.0000 mL | Freq: Once | INTRAVENOUS | Status: AC
  Administered 2022-09-09: 250 mL via INTRAVENOUS

## 2022-09-09 MED ORDER — LACTATED RINGERS IV SOLN: INTRAVENOUS | Status: DC

## 2022-09-09 NOTE — Progress Notes (Addendum)
[place] for Heparin  Indication: Neuro IR  No Known Allergies  Patient Measurements: Height: '5\' 7"'$  (170.2 cm) Weight: 102.4 kg (225 lb 12 oz) IBW/kg (Calculated) : 61.6  Vital Signs: Temp: 98.3 F (36.8 C) (09/15 0000) Temp Source: Oral (09/15 0000) BP: 83/54 (09/15 0100) Pulse Rate: 54 (09/15 0100)  Labs: Recent Labs    09/06/22 0313 09/07/22 0345 09/08/22 0455 09/09/22 0033  HGB 12.5  --  12.1  --   HCT 37.2  --  36.6  --   PLT 332  --  345  --   LABPROT 13.4  --  12.3  --   INR 1.0  --  0.9  --   HEPARINUNFRC  --   --   --  <0.10*  CREATININE 1.15* 1.14* 1.10*  --      Estimated Creatinine Clearance: 70.2 mL/min (A) (by C-G formula based on SCr of 1.1 mg/dL (H)).   Assessment: 56 year old female to begin heparin s/p neuro IR procedure.  Initial heparin level <0.1. No bleeding issues at sheath site per RN.  Goal of Therapy:  Heparin level 0.1-0.25 Monitor platelets by anticoagulation protocol: Yes   Plan:  Increase heparin to 800 units/h Repeat heparin level in 6h  Arrie Senate, PharmD, [place], [facility] Clinical Pharmacist Please check AMION for all [facility] Pharmacy numbers 09/09/2022

## 2022-09-09 NOTE — Progress Notes (Addendum)
Chief Complaint: Patient was seen today for follow up (L)ICA stent angioplasty  Supervising Physician: Luanne Bras  Patient Status: [facility] - In-pt  Subjective: S/p endovascular treatment of atherosclerotic ulcerated stenotic lesion of the left internal carotid artery caval cavernous segment associated with a superior hypophyseal region aneurysm with placement of one 4.25 mm x 16 mm pipeline Shield flow diverter device. Pt sitting up in chair, feels good. States speech is much better. Right hand also feels better but still slow. Denies HA, N/V  Objective: Physical Exam: BP 123/82   Pulse 71   Temp 98 F (36.7 C) (Oral)   Resp 18   Ht '5\' 7"'$  (1.702 m)   Wt 225 lb 12 oz (102.4 kg)   LMP 02/09/2012   SpO2 99%   BMI 35.36 kg/m  A&O x 4 PERRLA, EOMI Face symmetric, tongue midline No drift, Fine motor intact RUE strength 4-5/5 (R)radial art puncture site soft, mildly tender. Excellent pulse, hand warm   Current Facility-Administered Medications:    0.9 %  sodium chloride infusion, , Intravenous, Continuous, Deveshwar, Sanjeev, MD, Last Rate: 80 mL/hr at 09/09/22 0700, Infusion Verify at 09/09/22 0700   acetaminophen (TYLENOL) tablet 650 mg, 650 mg, Oral, Q6H PRN, Tat, David, MD, 650 mg at 09/08/22 2004   alum & mag hydroxide-simeth (MAALOX/MYLANTA) 200-200-20 MG/5ML suspension 15 mL, 15 mL, Oral, Q6H PRN, Tat, David, MD, 15 mL at 09/04/22 0347   aspirin EC tablet 81 mg, 81 mg, Oral, Daily, Tat, David, MD, 81 mg at 09/08/22 1235   atorvastatin (LIPITOR) tablet 80 mg, 80 mg, Oral, q1800, Tat, Shanon Brow, MD, 80 mg at 09/08/22 1706   buprenorphine-naloxone (SUBOXONE) 8-2 mg per SL tablet 1 tablet, 1 tablet, Sublingual, Daily, Tat, David, MD, 1 tablet at 09/07/22 0908   ceFAZolin (ANCEF) IVPB 2g/100 mL premix, 2 g, Intravenous, to XRAY, [place], Roselyn Reef R, NP   Chlorhexidine Gluconate Cloth 2 % PADS 6 each, 6 each, Topical, Daily, Deveshwar, Sanjeev, MD   clevidipine  (CLEVIPREX) infusion 0.5 mg/mL, 0-21 mg/hr, Intravenous, Continuous, Deveshwar, Sanjeev, MD, Stopped at 09/08/22 1546   morphine (PF) 2 MG/ML injection 4 mg, 4 mg, Intravenous, Q4H PRN, Charlynne Cousins, MD, 4 mg at 09/08/22 4259   nicotine (NICODERM CQ - dosed in mg/24 hours) patch 14 mg, 14 mg, Transdermal, Daily, Tat, David, MD, 14 mg at 09/07/22 0908   ondansetron (ZOFRAN) injection 4 mg, 4 mg, Intravenous, Q6H PRN, Tat, David, MD, 4 mg at 09/08/22 1928   pantoprazole (PROTONIX) EC tablet 40 mg, 40 mg, Oral, BID, Donnamae Jude, RPH, 40 mg at 09/08/22 2127   potassium chloride SA (KLOR-CON M) CR tablet 20 mEq, 20 mEq, Oral, Q4H, Lama, Marge Duncans, MD   sodium chloride flush (NS) 0.9 % injection 3 mL, 3 mL, Intravenous, Once, Tat, David, MD   ticagrelor (BRILINTA) tablet 90 mg, 90 mg, Oral, BID, 90 mg at 09/08/22 2127 **OR** ticagrelor (BRILINTA) tablet 90 mg, 90 mg, Per Tube, BID, Deveshwar, Sanjeev, MD  Labs: CBC Recent Labs    09/08/22 0455 09/09/22 0033  WBC 7.4 6.6  HGB 12.1 11.5*  HCT 36.6 34.2*  PLT 345 329   BMET Recent Labs    09/08/22 0455 09/09/22 0033  NA 141 140  K 3.3* 3.3*  CL 106 111  CO2 22 20*  GLUCOSE 116* 115*  BUN 12 12  CREATININE 1.10* 1.29*  CALCIUM 9.4 8.1*   LFT No results for input(s): "PROT", "ALBUMIN", "AST", "ALT", "ALKPHOS", "BILITOT", "  BILIDIR", "IBILI", "LIPASE" in the last 72 hours. PT/INR Recent Labs    09/08/22 0455  LABPROT 12.3  INR 0.9     Studies/Results: No results found.  Assessment/Plan: S/p endovascular treatment of atherosclerotic ulcerated stenotic lesion of the left internal carotid artery caval cavernous segment associated with a superior hypophyseal region aneurysm with placement of one 4.25 mm x 16 mm pipeline Shield flow diverter device. Doing well. Can be discharged home from our standpoint. Cont Brilinta '90mg'$  bid and ASA '81mg'$  daily. Smoking cessation stressed to pt. Follow up with Dr. Estanislado Pandy in 2 weeks.(We  will contact pt to arrange)    LOS: 7 days    Ascencion Dike PA-C 09/09/2022 8:54 AM

## 2022-09-09 NOTE — Discharge Summary (Addendum)
Physician Discharge Summary   Patient: Rita [name] MRN: [id number] DOB: 1966/01/18  Admit date:     09/01/2022  Discharge date: 09/09/22  Discharge Physician: Oswald Hillock   PCP: [place]   Recommendations at discharge:   Follow-up IR as outpatient in 2 weeks  Discharge Diagnoses: Principal Problem:   Acute ischemic stroke [facility]) Active Problems:   GERD (gastroesophageal reflux disease)   Tobacco use   Hypokalemia   Mixed hyperlipidemia   Opioid dependence (HCC)   Obesity, Class II, BMI 35-39.9   Lung mass   Impaired glucose tolerance   Internal carotid artery stenosis, left  Resolved Problems:   * No resolved hospital problems. *  Hospital Course:  56 year old female with history of Barrett's esophagus, chronic back pain, chronic abdominal pain, COPD, stroke, hypertension, hyperlipidemia presented with right arm weakness when she woke up on 08/31/2022.  In the ED she was found to be afebrile with hemoglobin of 13, WBC 9.  MRI brain showed multiple acute infarcts in the frontal and parietal lobe.  CT of the head showed occluded left vertebral artery occlusion of the left subclavian artery with distal reconstitution and moderate paraclinoid ICA and right paraclinoid stenosis.  neurologist recommended transfer to [facility]   cerebral angiogram on 09/06/2022 showed saccular aneurysm in the left internal carotid artery.  Assessment and Plan:  Acute ischemic stroke -Multiple infarcts in the left frontal and parietal lobes -CTA of the head showed occluded left vertebral artery, occlusion of the left subclavian artery with distal reconstitution and moderate paraclinoid ICA and right paranoid stenosis -Neurology was consulted, recommended aspirin and Brilinta twice daily for 3 months -Also recommended high-dose statins -IR was consulted, recommended transfer to [facility] for carotid artery stenting -Patient underwent S/p endovascular treatment of atherosclerotic  ulcerated stenotic lesion of the left internal carotid artery caval cavernous segment associated with a superior hypophyseal region aneurysm with placement of one 4.25 mm x 16 mm pipeline Shield flow diverter device. -Echocardiogram showed EF of 40-98%, grade 1 diastolic dysfunction -IR recommends for discharge on aspirin daily and Brilinta twice daily -IR to follow-up as outpatient in 2 weeks   Impaired glucose tolerance -Hemoglobin A1c 6.2 -Lifestyle modification follow-up with PCP as outpatient   Right lobe mass -Diagnosed in January 2020; lost to follow-up -Repeat CT chest showed resolution of right lower lobe mass   Opiate dependence -Continue buprenorphine    Hyperlipidemia -Continue statin -We will add Zetia 10 mg daily   Hypokalemia -Replete  Acute kidney injury -Creatinine 1.29 yesterday, likely from dehydration, poor p.o. intake -Improved to 1.0 with IV fluids      Consultants: IR Procedures performed: Endovascular stent placement to left ICA Disposition: Home Diet recommendation:  Discharge Diet Orders (From admission, onward)     Start     Ordered   09/09/22 0000  Diet - low sodium heart healthy        09/09/22 1442           Cardiac diet DISCHARGE MEDICATION: Allergies as of 09/09/2022   No Known Allergies      Medication List     STOP taking these medications    clopidogrel 75 MG tablet Commonly known as: PLAVIX       TAKE these medications    aspirin EC 81 MG tablet Take 1 tablet (81 mg total) by mouth daily. Swallow whole. Start taking on: September 10, 2022   atorvastatin 80 MG tablet Commonly known as: LIPITOR Take 1  tablet (80 mg total) by mouth daily at 6 PM.   Buprenorphine HCl-Naloxone HCl 8-2 MG Film Take 1 Film by mouth 3 (three) times daily.   colchicine 0.6 MG tablet Take 1 tablet (0.6 mg total) by mouth 2 (two) times daily for 7 days.   Dexilant 60 MG capsule Generic drug: dexlansoprazole TAKE 1 CAPSULE(60 MG)  BY MOUTH AT BEDTIME What changed: See the new instructions.   ezetimibe 10 MG tablet Commonly known as: ZETIA Take 1 tablet (10 mg total) by mouth daily. Start taking on: September 10, 2022   potassium chloride 10 MEQ CR capsule Commonly known as: MICRO-K Take 10 mEq by mouth 2 (two) times daily.   ticagrelor 90 MG Tabs tablet Commonly known as: BRILINTA Take 1 tablet (90 mg total) by mouth 2 (two) times daily.        Follow-up Information     [facility] Follow up.   Specialty: Rehabilitation Why: The outpatient rehab will contact you for the first appointment Contact information: [place] [phone] Prudy Feeler [place]        Luanne Bras, MD Follow up in 2 week(s).   Specialties: Interventional Radiology, Radiology Why: Clinic will call you to schedule follow up Contact information: [address] [place],  [place] [address] [place] [address] [phone]                Discharge Exam: Danley Danker Weights   09/01/22 1523 09/01/22 2114  Weight: 101.5 kg 102.4 kg   General-appears in no acute distress Heart-S1-S2, regular, no murmur auscultated Lungs-clear to auscultation bilaterally, no wheezing or crackles auscultated Abdomen-soft, nontender, no organomegaly Extremities-no edema in the lower extremities Neuro-alert, oriented x3, no focal deficit noted  Condition at discharge: good  The results of significant diagnostics from this hospitalization (including imaging, microbiology, ancillary and laboratory) are listed below for reference.   Imaging Studies: ECHOCARDIOGRAM COMPLETE  Result Date: 09/02/2022    ECHOCARDIOGRAM REPORT   Patient Name:   AVEEN STANSEL Date of Exam: 09/02/2022 Medical Rec #:  [id number]     Height:       67.0 in Accession #:    [id number]    Weight:       225.7 lb Date of Birth:  Jul 03, 1966     BSA:          2.129 m Patient Age:    56 years       BP:           128/65 mmHg Patient Gender: F             HR:           64 bpm. Exam Location:  [facility] Procedure: 2D Echo, Color Doppler and Cardiac Doppler Indications:    Stroke I63.9  History:        Patient has prior history of Echocardiogram examinations, most                 recent 01/23/2019. Stroke and COPD; Risk Factors:Dyslipidemia,                 Current Smoker and Hypertension. Lung mass.  Sonographer:    Alvino Chapel RCS Referring Phys: [id number] OLADAPO ADEFESO IMPRESSIONS  1. Left ventricular ejection fraction, by estimation, is 60 to 65%. The left ventricle has normal function. The left ventricle has no regional wall motion abnormalities. There is mild left ventricular hypertrophy. Left ventricular diastolic parameters are  consistent with Grade I diastolic dysfunction (impaired relaxation).  2. Right ventricular systolic function is normal. The right ventricular size is normal. There is normal pulmonary artery systolic pressure. The estimated right ventricular systolic pressure is 42.3 mmHg.  3. The mitral valve is normal in structure. No evidence of mitral valve regurgitation. No evidence of mitral stenosis.  4. The aortic valve is tricuspid. Aortic valve regurgitation is not visualized. Mild aortic valve stenosis.  5. The inferior vena cava is normal in size with greater than 50% respiratory variability, suggesting right atrial pressure of 3 mmHg. FINDINGS  Left Ventricle: Left ventricular ejection fraction, by estimation, is 60 to 65%. The left ventricle has normal function. The left ventricle has no regional wall motion abnormalities. Definity contrast agent was given IV to delineate the left ventricular  endocardial borders. The left ventricular internal cavity size was normal in size. There is mild left ventricular hypertrophy. Left ventricular diastolic parameters are consistent with Grade I diastolic dysfunction (impaired relaxation). Right Ventricle: The right ventricular size is normal.  No increase in right ventricular wall thickness. Right ventricular systolic function is normal. There is normal pulmonary artery systolic pressure. The tricuspid regurgitant velocity is 2.18 m/s, and  with an assumed right atrial pressure of 3 mmHg, the estimated right ventricular systolic pressure is 53.6 mmHg. Left Atrium: Left atrial size was normal in size. Right Atrium: Right atrial size was normal in size. Pericardium: There is no evidence of pericardial effusion. Mitral Valve: The mitral valve is normal in structure. No evidence of mitral valve regurgitation. No evidence of mitral valve stenosis. Tricuspid Valve: The tricuspid valve is normal in structure. Tricuspid valve regurgitation is trivial. Aortic Valve: The aortic valve is tricuspid. Aortic valve regurgitation is not visualized. Mild aortic stenosis is present. Aortic valve mean gradient measures 9.0 mmHg. Aortic valve peak gradient measures 19.9 mmHg. Aortic valve area, by VTI measures 1.90 cm. Pulmonic Valve: The pulmonic valve was not well visualized. Pulmonic valve regurgitation is trivial. Aorta: The aortic root is normal in size and structure. Venous: The inferior vena cava is normal in size with greater than 50% respiratory variability, suggesting right atrial pressure of 3 mmHg. IAS/Shunts: The interatrial septum was not well visualized.  LEFT VENTRICLE PLAX 2D LVIDd:         3.60 cm   Diastology LVIDs:         2.40 cm   LV e' medial:    6.20 cm/s LV PW:         1.20 cm   LV E/e' medial:  11.6 LV IVS:        1.20 cm   LV e' lateral:   7.72 cm/s LVOT diam:     1.90 cm   LV E/e' lateral: 9.3 LV SV:         77 LV SV Index:   36 LVOT Area:     2.84 cm  RIGHT VENTRICLE RV S prime:     11.30 cm/s TAPSE (M-mode): 2.3 cm LEFT ATRIUM             Index        RIGHT ATRIUM           Index LA diam:        3.60 cm 1.69 cm/m   RA Area:     12.20 cm LA Vol (A2C):   47.2 ml 22.17 ml/m  RA Volume:   24.50 ml  11.51 ml/m LA Vol (A4C):   41.9 ml 19.68  ml/m LA Biplane Vol: 44.7 ml 21.00 ml/m  AORTIC VALVE AV Area (Vmax):    1.60 cm AV Area (Vmean):   1.77 cm AV Area (VTI):     1.90 cm AV Vmax:           223.00 cm/s AV Vmean:          137.000 cm/s AV VTI:            0.407 m AV Peak Grad:      19.9 mmHg AV Mean Grad:      9.0 mmHg LVOT Vmax:         126.00 cm/s LVOT Vmean:        85.400 cm/s LVOT VTI:          0.273 m LVOT/AV VTI ratio: 0.67  AORTA Ao Root diam: 3.00 cm MITRAL VALVE               TRICUSPID VALVE MV Area (PHT): 2.34 cm    TR Peak grad:   19.0 mmHg MV Decel Time: 324 msec    TR Vmax:        218.00 cm/s MV E velocity: 71.80 cm/s MV A velocity: 92.50 cm/s  SHUNTS MV E/A ratio:  0.78        Systemic VTI:  0.27 m                            Systemic Diam: 1.90 cm Oswaldo Milian MD Electronically signed by Oswaldo Milian MD Signature Date/Time: 09/02/2022/5:37:23 PM    Final    CT CHEST WO CONTRAST  Result Date: 09/02/2022 CLINICAL DATA:  History of right lung mass EXAM: CT CHEST WITHOUT CONTRAST TECHNIQUE: Multidetector CT imaging of the chest was performed following the standard protocol without IV contrast. RADIATION DOSE REDUCTION: This exam was performed according to the departmental dose-optimization program which includes automated exposure control, adjustment of the mA and/or kV according to patient size and/or use of iterative reconstruction technique. COMPARISON:  Chest CT 01/20/2019 FINDINGS: Cardiovascular: Limited without intravenous contrast. Mild aortic atherosclerosis. No aneurysm. Normal cardiac size. No pericardial effusion. Mediastinum/Nodes: Midline trachea. No suspicious thyroid nodule. No suspicious lymph nodes. Esophagus within normal limits. Lungs/Pleura: Punctate calcified and non calcified nodules, not significantly changed and felt consistent with benign finding, no imaging follow-up is recommended. Previously noted right lower lobe masslike consolidation on the exam from 2020 is no longer visualized. No  suspicious pulmonary nodules on today's study. No acute airspace disease, pleural effusion, or pneumothorax. Upper Abdomen: Hepatic steatosis Musculoskeletal: No chest wall mass or suspicious bone lesions identified. IMPRESSION: 1. The previously noted right lower lobe masslike consolidation is no longer visualized. No suspicious pulmonary nodules on today's study. 2. Hepatic steatosis Aortic Atherosclerosis (ICD10-I70.0). Electronically Signed   By: Donavan Foil M.D.   On: 09/02/2022 17:29   CT ANGIO HEAD NECK W WO CM  Result Date: 09/02/2022 CLINICAL DATA:  Neuro deficit, acute, stroke suspected acute stroke on MR brain EXAM: CT ANGIOGRAPHY HEAD AND NECK TECHNIQUE: Multidetector CT imaging of the head and neck was performed using the standard protocol during bolus administration of intravenous contrast. Multiplanar CT image reconstructions and MIPs were obtained to evaluate the vascular anatomy. Carotid stenosis measurements (when applicable) are obtained utilizing NASCET criteria, using the distal internal carotid diameter as the denominator. RADIATION DOSE REDUCTION: This exam was performed according to the departmental dose-optimization program which includes automated exposure control, adjustment of the mA and/or  kV according to patient size and/or use of iterative reconstruction technique. CONTRAST:  13m OMNIPAQUE IOHEXOL 350 MG/ML SOLN COMPARISON:  None Available. FINDINGS: CT HEAD FINDINGS Brain: Left frontoparietal infarcts better characterized on recent MRI. No evidence of progressive mass effect or acute hemorrhage. Multiple remote infarcts including in the cerebellum, left occipital lobe and left corona radiata. No evidence of hydrocephalus or mass lesion. Vascular: See below. Skull: No acute fracture. Sinuses/Orbits: Clear sinuses. Other: No mastoid effusions. Review of the MIP images confirms the above findings CTA NECK FINDINGS Aortic arch: Occlusion of the proximal left subclavian artery with  distal reconstitution. Right carotid system: Atherosclerosis at the carotid bifurcation involving the proximal ICA without greater than 50% stenosis. Left carotid system: No greater than 50% stenosis. Atherosclerosis at the carotid bifurcation involving the proximal ICA. Vertebral arteries: Left vertebral artery is occluded at its origin with 4/irregular distal V2 reconstitution. Right vertebral artery is small, but patent proximally with occlusion at the V3 segment. Skeleton: No acute findings. Other neck: No acute findings. Upper chest: Visualized lung apices are clear. Review of the MIP images confirms the above findings CTA HEAD FINDINGS Anterior circulation: Bilateral intracranial ICAs are patent with areas of atherosclerotic narrowing, including moderate to severe left cavernous and paraclinoid and moderate right paraclinoid ICA stenosis. Mild high bilateral M1 MCA stenosis. Bilateral M1 and proximal M2 MCA vessels are patent. Bilateral ACAs are patent without proximal hemodynamically significant stenosis. Posterior circulation: Both intradural vertebral arteries are largely non-opacified with small/irregular basilar artery. The PCAs are small bilaterally but appear patent. Venous sinuses: As permitted by contrast timing, patent. Review of the MIP images confirms the above findings IMPRESSION: 1. Occluded left vertebral artery at its origin. Small right vertebral artery is occluded at its V3 segment. Both intradural vertebral arteries are largely non-opacified with small/irregular basilar artery. Small but patent PCAs. 2. Occlusion of the proximal left subclavian artery with distal reconstitution. 3. Moderate to severe left cavernous and paraclinoid ICA and moderate right paraclinoid ICA stenosis. 4. Mild bilateral M1 MCA stenosis. These results will be called to the ordering clinician or representative by the Radiologist Assistant, and communication documented in the PACS or [employer] Electronically  Signed   By: FMargaretha SheffieldM.D.   On: 09/02/2022 10:27   MR Brain W and Wo Contrast  Result Date: 09/01/2022 CLINICAL DATA:  Neuro deficit, acute, stroke suspected EXAM: MRI HEAD WITHOUT AND WITH CONTRAST MRA HEAD WITHOUT CONTRAST TECHNIQUE: Multiplanar, multiecho pulse sequences of the brain and surrounding structures were obtained without and with intravenous contrast. Angiographic images of the Circle of Willis were obtained using MRA technique without intravenous contrast. CONTRAST:  138mGADAVIST GADOBUTROL 1 MMOL/ML IV SOLN COMPARISON:  MRI head 08/11/2021.  MRA 01/21/2019. FINDINGS: MRI HEAD FINDINGS Brain: Multiple acute infarcts in the high left frontal and parietal lobes, potentially watershed and/or left MCA territory. Many other remote infarcts, including in the corona radiata and cerebellum bilaterally. No evidence of acute hemorrhage, mass lesion, midline shift or hydrocephalus. Mild enhancement in the region of the infarct, probably enhance infarcts. Otherwise, no pathologic enhancement. Vascular: Detailed below. Skull and upper cervical spine: Normal marrow signal. Sinuses/Orbits: Mild paranasal sinus mucosal thickening. No acute orbital findings. Other: No mastoid effusions. MRA HEAD FINDINGS Motion limited study. Anterior circulation: Limited assessment due to motion with bilateral intracranial ICAs, MCAs and ACAs grossly patent without visible proximal emergent large vessel occlusion. Posterior circulation: Limited study due to patient motion with the visualized basilar artery appearing patent. Only  the distal most intradural vertebral arteries are imaged, which appear grossly patent. Poor assessment of the posterior cerebral arteries bilaterally with flow related signal in the P1 and proximal P2 segments and nondiagnostic evaluation more distally. Chronically small vertebrobasilar system. IMPRESSION: 1. Multiple acute infarcts in the high left frontal and parietal lobes, potentially  watershed. 2. Motion limited MRA without definite evidence of emergent proximal large vessel occlusion. 3. Particularly limited evaluation of the PCAs with nondiagnostic evaluation distal to the mid P2 PCAs bilaterally. A CTA could further characterize if clinically warranted. Electronically Signed   By: Margaretha Sheffield M.D.   On: 09/01/2022 18:44   MR ANGIO HEAD WO CONTRAST  Result Date: 09/01/2022 CLINICAL DATA:  Neuro deficit, acute, stroke suspected EXAM: MRI HEAD WITHOUT AND WITH CONTRAST MRA HEAD WITHOUT CONTRAST TECHNIQUE: Multiplanar, multiecho pulse sequences of the brain and surrounding structures were obtained without and with intravenous contrast. Angiographic images of the Circle of Willis were obtained using MRA technique without intravenous contrast. CONTRAST:  64m GADAVIST GADOBUTROL 1 MMOL/ML IV SOLN COMPARISON:  MRI head 08/11/2021.  MRA 01/21/2019. FINDINGS: MRI HEAD FINDINGS Brain: Multiple acute infarcts in the high left frontal and parietal lobes, potentially watershed and/or left MCA territory. Many other remote infarcts, including in the corona radiata and cerebellum bilaterally. No evidence of acute hemorrhage, mass lesion, midline shift or hydrocephalus. Mild enhancement in the region of the infarct, probably enhance infarcts. Otherwise, no pathologic enhancement. Vascular: Detailed below. Skull and upper cervical spine: Normal marrow signal. Sinuses/Orbits: Mild paranasal sinus mucosal thickening. No acute orbital findings. Other: No mastoid effusions. MRA HEAD FINDINGS Motion limited study. Anterior circulation: Limited assessment due to motion with bilateral intracranial ICAs, MCAs and ACAs grossly patent without visible proximal emergent large vessel occlusion. Posterior circulation: Limited study due to patient motion with the visualized basilar artery appearing patent. Only the distal most intradural vertebral arteries are imaged, which appear grossly patent. Poor assessment of  the posterior cerebral arteries bilaterally with flow related signal in the P1 and proximal P2 segments and nondiagnostic evaluation more distally. Chronically small vertebrobasilar system. IMPRESSION: 1. Multiple acute infarcts in the high left frontal and parietal lobes, potentially watershed. 2. Motion limited MRA without definite evidence of emergent proximal large vessel occlusion. 3. Particularly limited evaluation of the PCAs with nondiagnostic evaluation distal to the mid P2 PCAs bilaterally. A CTA could further characterize if clinically warranted. Electronically Signed   By: FMargaretha SheffieldM.D.   On: 09/01/2022 18:44   CT HEAD WO CONTRAST  Result Date: 09/01/2022 CLINICAL DATA:  Headache, slurred speech, right-sided weakness. Symptoms began yesterday. EXAM: CT HEAD WITHOUT CONTRAST TECHNIQUE: Contiguous axial images were obtained from the base of the skull through the vertex without intravenous contrast. RADIATION DOSE REDUCTION: This exam was performed according to the departmental dose-optimization program which includes automated exposure control, adjustment of the mA and/or kV according to patient size and/or use of iterative reconstruction technique. COMPARISON:  CT head 08/11/2021 FINDINGS: Brain: No evidence of acute infarction, hemorrhage, hydrocephalus, extra-axial collection or mass lesion/mass effect. Chronic infarcts noted in the bilateral cerebellum, left occipital lobe, and left corona radiata. Vascular: No hyperdense vessel or unexpected calcification. Skull: Normal. Negative for fracture or focal lesion. Sinuses/Orbits: No acute finding. Trace mucosal thickening in the maxillary sinuses, left sphenoid sinus, and left frontal sinus as well as mild-to-moderate mucosal thickening in scattered bilateral anterior ethmoid air cells. Mastoid air cells are clear. Other: None. IMPRESSION: No acute intracranial abnormality. Chronic infarcts in the  bilateral cerebellum, left occipital lobe, and  left corona radiata. Electronically Signed   By: Ileana Roup M.D.   On: 09/01/2022 17:58    Microbiology: Results for orders placed or performed during the hospital encounter of 01/20/19  Culture, blood (Routine X 2) w Reflex to ID Panel     Status: None   Collection Time: 01/21/19  3:26 PM   Specimen: Right Antecubital; Blood  Result Value Ref Range Status   Specimen Description RIGHT ANTECUBITAL  Final   Special Requests   Final    BOTTLES DRAWN AEROBIC AND ANAEROBIC Blood Culture adequate volume   Culture   Final    NO GROWTH 5 DAYS Performed at [facility], [address] [place], [place] [address]    Report Status 01/26/2019 FINAL  Final    Labs: CBC: Recent Labs  Lab 09/03/22 0517 09/06/22 0313 09/08/22 0455 09/09/22 0033  WBC 9.2 8.7 7.4 6.6  NEUTROABS  --   --  3.5 3.2  HGB 13.8 12.5 12.1 11.5*  HCT 41.2 37.2 36.6 34.2*  MCV 86.6 86.7 86.1 86.8  PLT 345 332 345 010   Basic Metabolic Panel: Recent Labs  Lab 09/04/22 0208 09/05/22 0920 09/06/22 0313 09/07/22 0345 09/08/22 0455 09/09/22 0033 09/09/22 1244  NA 140   < > 139 139 141 140 141  K 3.1*   < > 3.8 3.4* 3.3* 3.3* 3.5  CL 103   < > 112* 111 106 111 110  CO2 26   < > 21* 21* 22 20* 21*  GLUCOSE 131*   < > 121* 122* 116* 115* 105*  BUN 5*   < > '12 13 12 12 10  '$ CREATININE 0.94   < > 1.15* 1.14* 1.10* 1.29* 1.00  CALCIUM 8.9   < > 8.7* 8.7* 9.4 8.1* 8.7*  MG 1.9  --   --   --   --   --   --    < > = values in this interval not displayed.   Liver Function Tests: Recent Labs  Lab 09/05/22 0920  AST 19  ALT 25  ALKPHOS 72  BILITOT 0.4  PROT 6.3*  ALBUMIN 3.4*   CBG: No results for input(s): "GLUCAP" in the last 168 hours.  Discharge time spent: greater than 30 minutes.  Signed: Oswald Hillock, MD [facility] 09/09/2022

## 2022-09-09 NOTE — Progress Notes (Signed)
Pt discharged to home with sig. Other. Wheeled to car by Therapist, sports.

## 2022-09-09 NOTE — Progress Notes (Signed)
   09/09/22 0050  Provider Notification  Provider Name/Title Deveshwar MD  Date Provider Notified 09/09/22  Time Provider Notified 0050  Method of Notification Page  Notification Reason Change in status (BP below parameters)  Provider response See new orders (237m bolus, change fluid rate to 80)  Date of Provider Response 09/09/22  Time of Provider Response 0150

## 2022-09-13 ENCOUNTER — Other Ambulatory Visit (HOSPITAL_COMMUNITY): Payer: Self-pay | Admitting: Interventional Radiology

## 2022-09-13 ENCOUNTER — Telehealth (HOSPITAL_COMMUNITY): Payer: Self-pay

## 2022-09-13 ENCOUNTER — Encounter (HOSPITAL_COMMUNITY): Payer: Self-pay

## 2022-09-13 DIAGNOSIS — I671 Cerebral aneurysm, nonruptured: Secondary | ICD-10-CM

## 2022-09-13 DIAGNOSIS — I771 Stricture of artery: Secondary | ICD-10-CM

## 2022-09-13 HISTORY — PX: IR ANGIO INTRA EXTRACRAN SEL COM CAROTID INNOMINATE BILAT MOD SED: IMG5360

## 2022-09-13 HISTORY — PX: IR ANGIO VERTEBRAL SEL SUBCLAVIAN INNOMINATE BILAT MOD SED: IMG5366

## 2022-09-13 HISTORY — PX: IR NEURO EACH ADD'L AFTER BASIC UNI LEFT (MS): IMG5373

## 2022-09-13 HISTORY — PX: IR ANGIOGRAM FOLLOW UP STUDY: IMG697

## 2022-09-13 LAB — POCT ACTIVATED CLOTTING TIME: Activated Clotting Time: 191 seconds

## 2022-09-13 NOTE — Telephone Encounter (Signed)
Called to schedule 2 wk f/u. Pt was in doctors office, will call back when she is out. AW

## 2022-09-28 NOTE — Therapy (Unsigned)
OUTPATIENT PHYSICAL THERAPY NEURO EVALUATION   Patient Name: Rita [name] MRN: [id number] DOB:1966-11-04, 56 y.o., female Today's Date: 09/28/2022   PCP: [place] PROVIDER: Breck Coons    Past Medical History:  Diagnosis Date   Arthritis    "qwhere" (02/22/2018)   Barrett's esophagus with esophagitis 03/26/2013   Chronic abdominal pain    Chronic lower back pain    Chronic pancreatitis (HCC)    COPD (chronic obstructive pulmonary disease) (HCC)    Depression    Dyspnea    GERD (gastroesophageal reflux disease)    Heart murmur    Hiatal hernia    High cholesterol    Hypertension    Peptic ulcer    Pneumonia 2000s X 1   Stroke ([place])    Tobacco use 03/26/2013   Past Surgical History:  Procedure Laterality Date   AORTIC ARCH ANGIOGRAPHY N/A 01/19/2018   Procedure: AORTIC ARCH ANGIOGRAPHY;  Surgeon: Elam Dutch, MD;  Location: [place] [facility];  Service: Cardiovascular;  Laterality: N/A;   BIOPSY  04/17/2017   Procedure: BIOPSY;  Surgeon: Daneil Dolin, MD;  [facility];  Service: Endoscopy;;  esophageal   CARDIAC CATHETERIZATION     CAROTID-SUBCLAVIAN BYPASS GRAFT Left 03/19/2018   Procedure: BYPASS GRAFT CAROTID-SUBCLAVIAN;  Surgeon: Elam Dutch, MD;  Location: [place];  Service: Vascular;  Laterality: Left;   [place]; 1988   COLONOSCOPY WITH PROPOFOL N/A 04/17/2017   Procedure: COLONOSCOPY WITH PROPOFOL;  Surgeon: Daneil Dolin, MD;  [facility];  Service: Endoscopy;  Laterality: N/A;  100   ESOPHAGOGASTRODUODENOSCOPY  Oct 2011   Dr. Laural Golden: ulcer in distal esophagus, soft stricture at GE junction s/p balloon dilation PATH: BARRETT'S   ESOPHAGOGASTRODUODENOSCOPY (EGD) WITH ESOPHAGEAL DILATION N/A 03/27/2013   Procedure: ESOPHAGOGASTRODUODENOSCOPY (EGD) WITH ESOPHAGEAL DILATION;  Surgeon: Daneil Dolin, MD;  [facility];  Service: Endoscopy;  Laterality: N/A;  possible dilation    ESOPHAGOGASTRODUODENOSCOPY (EGD) WITH PROPOFOL N/A 01/02/2017   Procedure: ESOPHAGOGASTRODUODENOSCOPY (EGD) WITH PROPOFOL;  Surgeon: Daneil Dolin, MD;  [facility];  Service: Endoscopy;  Laterality: N/A;  with possible esophageal dilation   ESOPHAGOGASTRODUODENOSCOPY (EGD) WITH PROPOFOL N/A 04/17/2017   Procedure: ESOPHAGOGASTRODUODENOSCOPY (EGD) WITH PROPOFOL;  Surgeon: Daneil Dolin, MD;  [facility];  Service: Endoscopy;  Laterality: N/A;   EUS N/A 05/09/2013   Procedure: UPPER ENDOSCOPIC ULTRASOUND (EUS) LINEAR;  Surgeon: Milus Banister, MD;  Location: [facility];  Service: Endoscopy;  Laterality: N/A;   FRACTURE SURGERY     pt. denies   IR ANGIO INTRA EXTRACRAN SEL COM CAROTID INNOMINATE BILAT MOD SED  09/06/2022   IR ANGIO INTRA EXTRACRAN SEL COM CAROTID INNOMINATE BILAT MOD SED  09/13/2022   IR ANGIO VERTEBRAL SEL SUBCLAVIAN INNOMINATE BILAT MOD SED  09/13/2022   IR ANGIO VERTEBRAL SEL SUBCLAVIAN INNOMINATE UNI R MOD SED  09/06/2022   IR ANGIOGRAM FOLLOW UP STUDY  09/13/2022   IR CT HEAD LTD  09/08/2022   IR NEURO EACH ADD'L AFTER BASIC UNI LEFT (MS)  09/13/2022   IR TRANSCATH/EMBOLIZ  09/08/2022   IR US GUIDE VASC ACCESS RIGHT  09/08/2022   IR US GUIDE VASC ACCESS RIGHT  09/06/2022   KNEE ARTHROSCOPY Right    LAPAROSCOPIC CHOLECYSTECTOMY     LEFT HEART CATH AND CORONARY ANGIOGRAPHY N/A 02/26/2018   Procedure: LEFT HEART CATH AND CORONARY ANGIOGRAPHY;  Surgeon: Burnell Blanks, MD;  [facility]: [facility]  IN[facility];  Service: Cardiovascular;  Laterality: N/A;   MALONEY DILATION N/A 04/17/2017   Procedure: Venia Minks DILATION;  Surgeon: Daneil Dolin, MD;  [facility];  Service: Endoscopy;  Laterality: N/A;   PATELLA FRACTURE SURGERY Left    POLYPECTOMY  04/17/2017   Procedure: POLYPECTOMY;  Surgeon: Daneil Dolin, MD;  [facility];  Service: Endoscopy;;   RADIOLOGY WITH ANESTHESIA N/A 09/08/2022   Procedure: L ICA Aneurysm Embolazation;   Surgeon: Luanne Bras, MD;  Location: [place];  Service: Radiology;  Laterality: N/A;   TEE WITHOUT CARDIOVERSION N/A 01/23/2019   Procedure: TRANSESOPHAGEAL ECHOCARDIOGRAM (TEE);  Surgeon: Jerline Pain, MD;  Location: [facility];  Service: Cardiovascular;  Laterality: N/A;   [place] N/A 01/19/2018   Procedure: UPPER EXTREMITY ANGIOGRAPHY;  Surgeon: Elam Dutch, MD;  Location: [place] [facility];  Service: Cardiovascular;  Laterality: N/A;   Patient Active Problem List   Diagnosis Date Noted   Internal carotid artery stenosis, left 09/08/2022   Opioid dependence ([place]) 09/02/2022   Obesity, Class II, BMI 35-39.9 09/02/2022   Lung mass 09/02/2022   Impaired glucose tolerance 09/02/2022   Acute ischemic stroke ([place]) 09/01/2022   Mixed hyperlipidemia 09/01/2022   Cavitating mass in right lower lung lobe (PNA Vs Cancer) 01/21/2019   Gastroenteritis 01/20/2019   Left sided cerebral hemisphere cerebrovascular accident (CVA) ([place]) 01/20/2019   Stenosis of left subclavian artery ([place]) 03/19/2018   Chest pain 02/21/2018   Left subclavian artery occlusion 02/21/2018   Rectal bleeding 03/21/2017   Hiatal hernia with gastroesophageal reflux disease and esophagitis    Biliary pain    Pain of upper abdomen    Elevated liver enzymes    Chronic abdominal pain    Pancreatitis 02/06/2015   Acute pancreatitis    Elevated LFTs    Pancreatitis, acute 05/09/2013   Hypokalemia 03/27/2013   Anemia 03/27/2013   Esophageal dysphagia 03/26/2013   Chronic back pain 03/26/2013   GERD (gastroesophageal reflux disease) 03/26/2013   Tobacco use 03/26/2013   Barrett's esophagus with esophagitis 03/26/2013   Elevated lipase 08/20/2011    ONSET DATE:  08/31/2022   REFERRING DIAG: I63.9 (ICD-10-CM) - Cerebral infarction, unspecified mechanism   THERAPY DIAG:  Difficulty in walking, decreased strength.  Rationale for Evaluation and Treatment  Rehabilitation  SUBJECTIVE:                                                                                                                                                                                              SUBJECTIVE STATEMENT: ***  Pt accompanied by: {accompnied:27141}  PERTINENT HISTORY: Acute ischemic stroke -Multiple infarcts in the left frontal and parietal lobes -CTA of the head showed occluded left vertebral artery, occlusion of the left subclavian artery with distal reconstitution and moderate paraclinoid ICA and right paranoid stenosis -Patient underwent S/p endovascular treatment of atherosclerotic ulcerated stenotic lesion of the left internal carotid artery caval cavernous segment associated with a superior hypophyseal region aneurysm with placement of one 4.25 mm x 16 mm pipeline Shield flow diverter device. -Echocardiogram showed EF of 02-63%, grade 1 diastolic dysfunction  PAIN:  Are you having pain? {OPRCPAIN:27236}  PRECAUTIONS: {Therapy precautions:24002}  WEIGHT BEARING RESTRICTIONS No  FALLS: Has patient fallen in last 6 months? {fallsyesno:27318}  LIVING ENVIRONMENT: Lives with: lives with their family Lives in: House/apartment Stairs: Yes: {Stairs:24000} Has following equipment at home: {Assistive devices:23999}  PLOF: Independent  PATIENT GOALS To move better   OBJECTIVE:   DIAGNOSTIC FINDINGS: as above   COGNITION: Overall cognitive status: Within functional limits for tasks assessed    MUSCLE TONE: {LE tone:25568}    LOWER EXTREMITY ROM:     {AROM/PROM:27142}  Right Eval Left Eval  Hip flexion    Hip extension    Hip abduction    Hip adduction    Hip internal rotation    Hip external rotation    Knee flexion    Knee extension    Ankle dorsiflexion    Ankle plantarflexion    Ankle inversion    Ankle eversion     (Blank rows = not tested)  LOWER EXTREMITY MMT:    MMT Right Eval Left Eval  Hip flexion    Hip  extension    Hip abduction    Hip adduction    Hip internal rotation    Hip external rotation    Knee flexion    Knee extension    Ankle dorsiflexion    Ankle plantarflexion    Ankle inversion    Ankle eversion    (Blank rows = not tested)  BED MOBILITY:  {Bed mobility:24027}  TRANSFERS: Assistive device utilized: {Assistive devices:23999}  Sit to stand: {Levels of assistance:24026} Stand to sit: {Levels of assistance:24026} Chair to chair: {Levels of assistance:24026} Floor: {Levels of assistance:24026}   STAIRS:  Level of Assistance: {Levels of assistance:24026}  Stair Negotiation Technique: {Stair Technique:27161} with {Rail Assistance:27162}  Number of Stairs: ***   Height of Stairs: ***  Comments: ***  GAIT: Gait pattern: {gait characteristics:25376} Distance walked: *** Assistive device utilized: {Assistive devices:23999} Level of assistance: {Levels of assistance:24026} Comments: ***  FUNCTIONAL TESTs:  5 times sit to stand: *** 2 minute walk test: *** Single leg stance:   PATIENT SURVEYS:  {rehab surveys:24030}  TODAY'S TREATMENT:  09/29/2022:  Evaluation and HEP   PATIENT EDUCATION: Education details: *** Person educated: {Person educated:25204} Education method: {Education Method:25205} Education comprehension: {Education Comprehension:25206}   HOME EXERCISE PROGRAM: ***    GOALS: Goals reviewed with patient? No  SHORT TERM GOALS: Target date: 10/20/2022  PT to be I in HEP in order to  Baseline: Goal status: {GOALSTATUS:25110}  2.  PT LE strength to be increased 1/2 grade to allow pt to  Baseline:  Goal status: {GOALSTATUS:25110}  3.  PT to be able to single leg stance for       seconds to decrease risk of falling  Baseline:  Goal status: {GOALSTATUS:25110}  4.  *** Baseline:  Goal status: {GOALSTATUS:25110}  5.  *** Baseline:  Goal status: {GOALSTATUS:25110}  6.  ***  Baseline:  Goal status: {GOALSTATUS:25110}  LONG  TERM GOALS: Target date: {follow up:25551} PT to be I in HEP in order to  Baseline: Goal status: {GOALSTATUS:25110}  2.  PT LE strength to be increased 1/2 grade to allow pt to  Baseline:  Goal status: {GOALSTATUS:25110}  3.  PT to be able to single leg stance for       seconds to decrease risk of falling  Baseline:  Goal status: {GOALSTATUS:25110}   4.  *** Baseline:  Goal status: {GOALSTATUS:25110}  5.  *** Baseline:  Goal status: {GOALSTATUS:25110}  6.  *** Baseline:  Goal status: {GOALSTATUS:25110}  ASSESSMENT:  CLINICAL IMPRESSION: Patient is a 56 y.o. female who was seen today for physical therapy evaluation and treatment for a recent stroke.  Evaluation demonstrated decreased strength, decreased balance, difficulty in walking and decreased activity tolerance.  The pt will benefit from skilled PT to address these issues and maximize her functional ability.    OBJECTIVE IMPAIRMENTS Abnormal gait, decreased activity tolerance, decreased balance, decreased mobility, difficulty walking, and decreased strength.   ACTIVITY LIMITATIONS carrying, lifting, standing, stairs, and locomotion level  PARTICIPATION LIMITATIONS: cleaning, driving, shopping, community activity, occupation, and yard work  PERSONAL FACTORS Past/current experiences and 1-2 comorbidities: chronic low back pain   are also affecting patient's functional outcome.   REHAB POTENTIAL: Good  CLINICAL DECISION MAKING: Stable/uncomplicated  EVALUATION COMPLEXITY: Moderate  PLAN: PT FREQUENCY: 2x/week  PT DURATION: 6 weeks  PLANNED INTERVENTIONS: Therapeutic exercises, Therapeutic activity, Neuromuscular re-education, Balance training, Gait training, Patient/Family education, and Self Care  PLAN FOR NEXT SESSION: ***   Araf Clugston,CINDY, PT 09/29/2022,

## 2022-09-29 ENCOUNTER — Encounter (HOSPITAL_COMMUNITY): Payer: Self-pay

## 2022-09-29 ENCOUNTER — Ambulatory Visit (HOSPITAL_COMMUNITY): Payer: Medicare Other | Attending: Internal Medicine | Admitting: Physical Therapy

## 2022-09-29 DIAGNOSIS — M6281 Muscle weakness (generalized): Secondary | ICD-10-CM | POA: Insufficient documentation

## 2022-09-29 DIAGNOSIS — R262 Difficulty in walking, not elsewhere classified: Secondary | ICD-10-CM | POA: Insufficient documentation

## 2022-10-07 ENCOUNTER — Ambulatory Visit (HOSPITAL_COMMUNITY): Payer: Medicare Other | Admitting: Occupational Therapy

## 2022-10-12 ENCOUNTER — Telehealth (HOSPITAL_COMMUNITY): Payer: Self-pay

## 2022-10-12 NOTE — Telephone Encounter (Signed)
Called to reschedule consult, no answer, left vm. AW  

## 2022-10-13 ENCOUNTER — Ambulatory Visit (HOSPITAL_COMMUNITY)
Admission: RE | Admit: 2022-10-13 | Discharge: 2022-10-13 | Disposition: A | Discharge status: Home / Self Care | Payer: Medicare Other | Source: Ambulatory Visit | Attending: Interventional Radiology | Admitting: Interventional Radiology

## 2022-10-13 ENCOUNTER — Telehealth (HOSPITAL_COMMUNITY): Payer: Self-pay

## 2022-10-13 DIAGNOSIS — I771 Stricture of artery: Secondary | ICD-10-CM

## 2022-10-13 NOTE — Telephone Encounter (Signed)
Called to reschedule consult, no answer, left vm. AW  

## 2022-10-18 NOTE — Therapy (Signed)
OUTPATIENT PHYSICAL THERAPY NEURO EVALUATION   Patient Name: Rita [name] MRN: [id number] DOB:1966/12/09, 56 y.o., female Today's Date: 10/19/2022   PCP: [facility] REFERRING PROVIDER: Charlynne Cousins, MD    PT End of Session - 10/19/22 1117     Visit Number 1    Number of Visits 4    Date for PT Re-Evaluation 11/16/22    Authorization Type [employer] Medicare    Progress Note Due on Visit 10    PT Start Time 1116    PT Stop Time 1158    PT Time Calculation (min) 42 min    Activity Tolerance Patient tolerated treatment well    Behavior During Therapy [facility] for tasks assessed/performed             Past Medical History:  Diagnosis Date   Arthritis    "qwhere" (02/22/2018)   Barrett's esophagus with esophagitis 03/26/2013   Chronic abdominal pain    Chronic lower back pain    Chronic pancreatitis (HCC)    COPD (chronic obstructive pulmonary disease) (HCC)    Depression    Dyspnea    GERD (gastroesophageal reflux disease)    Heart murmur    Hiatal hernia    High cholesterol    Hypertension    Peptic ulcer    Pneumonia 2000s X 1   Stroke ([place])    Tobacco use 03/26/2013   Past Surgical History:  Procedure Laterality Date   AORTIC ARCH ANGIOGRAPHY N/A 01/19/2018   Procedure: AORTIC ARCH ANGIOGRAPHY;  Surgeon: Elam Dutch, MD;  Location: [place] [facility];  Service: Cardiovascular;  Laterality: N/A;   BIOPSY  04/17/2017   Procedure: BIOPSY;  Surgeon: Daneil Dolin, MD;  [facility];  Service: Endoscopy;;  esophageal   CARDIAC CATHETERIZATION     CAROTID-SUBCLAVIAN BYPASS GRAFT Left 03/19/2018   Procedure: BYPASS GRAFT CAROTID-SUBCLAVIAN;  Surgeon: Elam Dutch, MD;  Location: [place];  Service: Vascular;  Laterality: Left;   [place]; 1988   COLONOSCOPY WITH PROPOFOL N/A 04/17/2017   Procedure: COLONOSCOPY WITH PROPOFOL;  Surgeon: Daneil Dolin, MD;  [facility];  Service: Endoscopy;  Laterality: N/A;  100    ESOPHAGOGASTRODUODENOSCOPY  Oct 2011   Dr. Laural Golden: ulcer in distal esophagus, soft stricture at GE junction s/p balloon dilation PATH: BARRETT'S   ESOPHAGOGASTRODUODENOSCOPY (EGD) WITH ESOPHAGEAL DILATION N/A 03/27/2013   Procedure: ESOPHAGOGASTRODUODENOSCOPY (EGD) WITH ESOPHAGEAL DILATION;  Surgeon: Daneil Dolin, MD;  [facility];  Service: Endoscopy;  Laterality: N/A;  possible dilation   ESOPHAGOGASTRODUODENOSCOPY (EGD) WITH PROPOFOL N/A 01/02/2017   Procedure: ESOPHAGOGASTRODUODENOSCOPY (EGD) WITH PROPOFOL;  Surgeon: Daneil Dolin, MD;  [facility];  Service: Endoscopy;  Laterality: N/A;  with possible esophageal dilation   ESOPHAGOGASTRODUODENOSCOPY (EGD) WITH PROPOFOL N/A 04/17/2017   Procedure: ESOPHAGOGASTRODUODENOSCOPY (EGD) WITH PROPOFOL;  Surgeon: Daneil Dolin, MD;  [facility];  Service: Endoscopy;  Laterality: N/A;   EUS N/A 05/09/2013   Procedure: UPPER ENDOSCOPIC ULTRASOUND (EUS) LINEAR;  Surgeon: Milus Banister, MD;  [facility];  Service: Endoscopy;  Laterality: N/A;   FRACTURE SURGERY     pt. denies   IR ANGIO INTRA EXTRACRAN SEL COM CAROTID INNOMINATE BILAT MOD SED  09/06/2022   IR ANGIO INTRA EXTRACRAN SEL COM CAROTID INNOMINATE BILAT MOD SED  09/13/2022   IR ANGIO VERTEBRAL SEL SUBCLAVIAN INNOMINATE BILAT MOD SED  09/13/2022   IR ANGIO VERTEBRAL SEL SUBCLAVIAN INNOMINATE UNI  R MOD SED  09/06/2022   IR ANGIOGRAM FOLLOW UP STUDY  09/13/2022   IR CT HEAD LTD  09/08/2022   IR NEURO EACH ADD'L AFTER BASIC UNI LEFT (MS)  09/13/2022   IR TRANSCATH/EMBOLIZ  09/08/2022   IR US GUIDE VASC ACCESS RIGHT  09/08/2022   IR US GUIDE VASC ACCESS RIGHT  09/06/2022   KNEE ARTHROSCOPY Right    LAPAROSCOPIC CHOLECYSTECTOMY     LEFT HEART CATH AND CORONARY ANGIOGRAPHY N/A 02/26/2018   Procedure: LEFT HEART CATH AND CORONARY ANGIOGRAPHY;  Surgeon: Burnell Blanks, MD;  Location: [place] [facility];  Service: Cardiovascular;  Laterality: N/A;    MALONEY DILATION N/A 04/17/2017   Procedure: Venia Minks DILATION;  Surgeon: Daneil Dolin, MD;  [facility];  Service: Endoscopy;  Laterality: N/A;   PATELLA FRACTURE SURGERY Left    POLYPECTOMY  04/17/2017   Procedure: POLYPECTOMY;  Surgeon: Daneil Dolin, MD;  [facility];  Service: Endoscopy;;   RADIOLOGY WITH ANESTHESIA N/A 09/08/2022   Procedure: L ICA Aneurysm Embolazation;  Surgeon: Luanne Bras, MD;  Location: [place];  Service: Radiology;  Laterality: N/A;   TEE WITHOUT CARDIOVERSION N/A 01/23/2019   Procedure: TRANSESOPHAGEAL ECHOCARDIOGRAM (TEE);  Surgeon: Jerline Pain, MD;  Location[facility];  Service: Cardiovascular;  Laterality: N/A;   [place] N/A 01/19/2018   Procedure: UPPER EXTREMITY ANGIOGRAPHY;  Surgeon: Elam Dutch, MD;  Location: [place] [facility];  Service: Cardiovascular;  Laterality: N/A;   Patient Active Problem List   Diagnosis Date Noted   Internal carotid artery stenosis, left 09/08/2022   Opioid dependence ([place]) 09/02/2022   Obesity, Class II, BMI 35-39.9 09/02/2022   Lung mass 09/02/2022   Impaired glucose tolerance 09/02/2022   Acute ischemic stroke ([place]) 09/01/2022   Mixed hyperlipidemia 09/01/2022   Cavitating mass in right lower lung lobe (PNA Vs Cancer) 01/21/2019   Gastroenteritis 01/20/2019   Left sided cerebral hemisphere cerebrovascular accident (CVA) ([place]) 01/20/2019   Stenosis of left subclavian artery ([place]) 03/19/2018   Chest pain 02/21/2018   Left subclavian artery occlusion 02/21/2018   Rectal bleeding 03/21/2017   Hiatal hernia with gastroesophageal reflux disease and esophagitis    Biliary pain    Pain of upper abdomen    Elevated liver enzymes    Chronic abdominal pain    Pancreatitis 02/06/2015   Acute pancreatitis    Elevated LFTs    Pancreatitis, acute 05/09/2013   Hypokalemia 03/27/2013   Anemia 03/27/2013   Esophageal dysphagia 03/26/2013    Chronic back pain 03/26/2013   GERD (gastroesophageal reflux disease) 03/26/2013   Tobacco use 03/26/2013   Barrett's esophagus with esophagitis 03/26/2013   Elevated lipase 08/20/2011    ONSET DATE: 08/31/2022  REFERRING DIAG: acute ischemic stroke  THERAPY DIAG:  Difficulty in walking, not elsewhere classified  Muscle weakness (generalized)  Rationale for Evaluation and Treatment Rehabilitation  SUBJECTIVE:  SUBJECTIVE STATEMENT: 56 year old female with history of Barrett's esophagus, chronic back pain, chronic abdominal pain, COPD, stroke, hypertension, hyperlipidemia presented with right arm weakness when she woke up on 08/31/2022. Complains now of right side weakness; impaired vision; unable to drive.  Her chief compliant is decreased use of her right hand; has OT evaluation coming up in the next week or 2. Patient working hard to stop smoking; has started smoking cessation with patches.   Pt accompanied by: self  PERTINENT HISTORY:  Principal Problem:   Acute ischemic stroke ([place]) Active Problems:   GERD (gastroesophageal reflux disease)   Tobacco use   Hypokalemia   Mixed hyperlipidemia   Opioid dependence (HCC)   Obesity, Class II, BMI 35-39.9   Lung mass   Impaired glucose tolerance   Internal carotid artery stenosis, left Loss of peripheral vision since stroke  PAIN:  Are you having pain? Yes: NPRS scale: 8/10 Pain location: upper traps and low back Pain description: tight Aggravating factors: unknown Relieving factors: unknown  PRECAUTIONS: Fall  WEIGHT BEARING RESTRICTIONS: No  FALLS: Has patient fallen in last 6 months? No  LIVING ENVIRONMENT: Lives with: lives with an adult companion Lives in: House/apartment Stairs: No Has following equipment at home: None  PLOF:  Independent  PATIENT GOALS: get control of the right hand.   OBJECTIVE:   DIAGNOSTIC FINDINGS: CT of head  COGNITION: Overall cognitive status: Within functional limits for tasks assessed   SENSATION: WFL  COORDINATION: Impaired right hand/ upper extremity  EDEMA:  None noted  MUSCLE TONE: wfl     POSTURE: rounded shoulders and forward head  LOWER EXTREMITY MMT's:    Active  Right Eval Left Eval  Hip flexion 4 5  Hip extension 4 4+  Hip abduction    Hip adduction    Hip internal rotation    Hip external rotation    Knee flexion 4- 4+  Knee extension 4+ 5  Ankle dorsiflexion 4 5  Ankle plantarflexion    Ankle inversion    Ankle eversion     (Blank rows = not tested)   BED MOBILITY:  Sit to supine Modified independence Supine to sit Modified independence Rolling to Right Modified independence Rolling to Left Modified independence     GAIT: Gait pattern: antalgic and wide BOS Distance walked: 50 Assistive device utilized: None Level of assistance: SBA Comments: slower than normal gait speed  FUNCTIONAL TESTS:  5 times sit to stand: 12 sec SLS 3" each side  PATIENT SURVEYS:  FOTO 59  TODAY'S TREATMENT:                                                                                  DATE: 10/19/22 Physical therapy evaluation and HEP instruction   PATIENT EDUCATION:  Education details: Patient educated on exam findings, POC, scope of PT, HEP. Person educated: Patient Education method: Explanation, Demonstration, and Handouts Education comprehension: verbalized understanding, returned demonstration, verbal cues required, and tactile cues required   HOME EXERCISE PROGRAM: Access Code: [email or web address] URL: [email or web address] Date: 10/19/2022 Prepared by: [facility]  Exercises - Sit to Stand Without Arm Support  - 2 x daily - 7 x weekly - 2  sets - 10 reps - Standing Single Leg Stance with Counter Support  - 2 x daily - 7 x  weekly - 2 sets - 2 reps - 10-30 sec hold - Standing Alternating Knee Flexion  - 2 x daily - 7 x weekly - 2 sets - 10 reps - Heel Toe Raises with Counter Support  - 2 x daily - 7 x weekly - 2 sets - 10 reps - Standing Marching  - 2 x daily - 7 x weekly - 2 sets - 10 reps   GOALS: Goals reviewed with patient? No  SHORT TERM GOALS: Target date: 11/02/2022  Patient will be independent with initial HEP  Baseline: Goal status: INITIAL  2.  Patient will improve 5 times sit to stand score from 12 sec to 10 sec to demonstrate improved functional mobility and increased lower extremity strength.  Baseline: 12 sec Goal status: INITIAL   LONG TERM GOALS: Target date: 11/16/2022  Patient will be independent in self management strategies to improve quality of life and functional outcomes.  Baseline:  Goal status: INITIAL  2.  Patient will improve FOTO score to predicted value  Baseline: 59 Goal status: INITIAL  3.  Patient will increase left leg MMTs to 5/5 without pain to promote return to ambulation community distances with minimal deviation.  Baseline: see above Goal status: INITIAL  4.   Patient will report at least 50% improvement in overall symptoms and/or function to demonstrate improved functional mobility  Baseline:  Goal status: INITIAL   ASSESSMENT:  CLINICAL IMPRESSION: Patient is a 56 y.o. female who was seen today for physical therapy evaluation and treatment for weakness and balance deficits after ischemic stroke. Patient presents on evaluation with lower extremity weakness right > left; balance deficits, antalgic gait all of which negatively impact her ability to perform tasks around the home; walk in the community and drive.  Patient will benefit from continued skilled therapy services to address deficits and promote return to optimal function.      OBJECTIVE IMPAIRMENTS: Abnormal gait, decreased activity tolerance, decreased balance, decreased coordination,  decreased endurance, decreased mobility, difficulty walking, decreased ROM, decreased strength, hypomobility, impaired perceived functional ability, impaired flexibility, impaired UE functional use, impaired vision/preception, and pain.   ACTIVITY LIMITATIONS: carrying, lifting, bending, sitting, standing, squatting, sleeping, stairs, transfers, dressing, self feeding, reach over head, hygiene/grooming, locomotion level, and caring for others  PARTICIPATION LIMITATIONS: meal prep, cleaning, laundry, driving, shopping, community activity, occupation, and yard work  [employer] POTENTIAL: Good  CLINICAL DECISION MAKING: Stable/uncomplicated  EVALUATION COMPLEXITY: Low  PLAN:  PT FREQUENCY: 1x/week  PT DURATION: 6 weeks  PLANNED INTERVENTIONS: Therapeutic exercises, Therapeutic activity, Neuromuscular re-education, Balance training, Gait training, Patient/Family education, Joint manipulation, Joint mobilization, Stair training, Orthotic/Fit training, DME instructions, Aquatic Therapy, Dry Needling, Electrical stimulation, Spinal manipulation, Spinal mobilization, Cryotherapy, Moist heat, Compression bandaging, scar mobilization, Splintting, Taping, Traction, Ultrasound, Ionotophoresis '4mg'$ /ml Dexamethasone, and Manual therapy  PLAN FOR NEXT SESSION: Review HEP and goals; progress lower extremity strengthening and balance as able.   2:14 PM, 10/19/22 Lethia Donlon Small Kenedie Dirocco MPT [place] [facility] [place] [phone]

## 2022-10-19 ENCOUNTER — Ambulatory Visit (HOSPITAL_COMMUNITY): Payer: Medicare Other

## 2022-10-19 DIAGNOSIS — M6281 Muscle weakness (generalized): Secondary | ICD-10-CM

## 2022-10-19 DIAGNOSIS — R262 Difficulty in walking, not elsewhere classified: Secondary | ICD-10-CM

## 2022-10-21 ENCOUNTER — Ambulatory Visit (HOSPITAL_COMMUNITY)
Admission: RE | Admit: 2022-10-21 | Discharge: 2022-10-21 | Disposition: A | Discharge status: Home / Self Care | Payer: Medicare Other | Source: Ambulatory Visit | Attending: Interventional Radiology | Admitting: Interventional Radiology

## 2022-10-21 ENCOUNTER — Encounter (HOSPITAL_COMMUNITY): Payer: Self-pay

## 2022-11-02 ENCOUNTER — Telehealth (HOSPITAL_COMMUNITY): Payer: Self-pay | Admitting: Physical Therapy

## 2022-11-02 ENCOUNTER — Ambulatory Visit (HOSPITAL_COMMUNITY): Payer: Medicare Other | Admitting: Occupational Therapy

## 2022-11-02 ENCOUNTER — Encounter (HOSPITAL_COMMUNITY): Payer: Medicare Other | Admitting: Physical Therapy

## 2022-11-02 NOTE — Telephone Encounter (Signed)
Pt did not show for PT appt.  Called and spoke to pt who reports she has a stomach virus. Asked to cancel OT evaluation scheduled later this morning.  Pt given number to call and reschedule her OT evaluation.  Reminded of next PT appt on 11/15.  Teena Irani, PTA/CLT [place] Ph: [phone]

## 2022-11-09 ENCOUNTER — Ambulatory Visit (HOSPITAL_COMMUNITY): Payer: Medicare Other | Attending: Internal Medicine

## 2022-11-09 DIAGNOSIS — R270 Ataxia, unspecified: Secondary | ICD-10-CM | POA: Insufficient documentation

## 2022-11-09 DIAGNOSIS — R278 Other lack of coordination: Secondary | ICD-10-CM | POA: Insufficient documentation

## 2022-11-09 DIAGNOSIS — R262 Difficulty in walking, not elsewhere classified: Secondary | ICD-10-CM | POA: Insufficient documentation

## 2022-11-09 DIAGNOSIS — M25611 Stiffness of right shoulder, not elsewhere classified: Secondary | ICD-10-CM | POA: Insufficient documentation

## 2022-11-09 DIAGNOSIS — M6281 Muscle weakness (generalized): Secondary | ICD-10-CM | POA: Insufficient documentation

## 2022-11-09 NOTE — Therapy (Signed)
OUTPATIENT PHYSICAL THERAPY NEURO EVALUATION   Patient Name: Rita [name] MRN: [id number] DOB:[birth year], 56 y.o., female Today's Date: 11/09/2022   PCP: [facility] REFERRING PROVIDER: Aileen Fass, Tammi Klippel, MD    PT End of Session - 11/09/22 [phone]     Visit Number 2    Number of Visits 4    Date for PT Re-Evaluation 11/16/22    Authorization Type [employer] Medicare    Progress Note Due on Visit 10    PT Start Time 0927    PT Stop Time 1015    PT Time Calculation (min) 48 min    Activity Tolerance Patient tolerated treatment well    Behavior During Therapy [facility] for tasks assessed/performed             Past Medical History:  Diagnosis Date   Arthritis    "qwhere" (02/22/2018)   Barrett's esophagus with esophagitis 03/26/2013   Chronic abdominal pain    Chronic lower back pain    Chronic pancreatitis (HCC)    COPD (chronic obstructive pulmonary disease) (HCC)    Depression    Dyspnea    GERD (gastroesophageal reflux disease)    Heart murmur    Hiatal hernia    High cholesterol    Hypertension    Peptic ulcer    Pneumonia 2000s X 1   Stroke ([place])    Tobacco use 03/26/2013   Past Surgical History:  Procedure Laterality Date   AORTIC ARCH ANGIOGRAPHY N/A 01/19/2018   Procedure: AORTIC ARCH ANGIOGRAPHY;  Surgeon: Elam Dutch, MD;  Location: [place] [facility];  Service: Cardiovascular;  Laterality: N/A;   BIOPSY  04/17/2017   Procedure: BIOPSY;  Surgeon: Daneil Dolin, MD;  [facility];  Service: Endoscopy;;  esophageal   CARDIAC CATHETERIZATION     CAROTID-SUBCLAVIAN BYPASS GRAFT Left 03/19/2018   Procedure: BYPASS GRAFT CAROTID-SUBCLAVIAN;  Surgeon: Elam Dutch, MD;  Location: [place];  Service: Vascular;  Laterality: Left;   [place]; 1988   COLONOSCOPY WITH PROPOFOL N/A 04/17/2017   Procedure: COLONOSCOPY WITH PROPOFOL;  Surgeon: Daneil Dolin, MD;  [facility];  Service: Endoscopy;  Laterality: N/A;  100    ESOPHAGOGASTRODUODENOSCOPY  Oct 2011   Dr. Laural Golden: ulcer in distal esophagus, soft stricture at GE junction s/p balloon dilation PATH: BARRETT'S   ESOPHAGOGASTRODUODENOSCOPY (EGD) WITH ESOPHAGEAL DILATION N/A 03/27/2013   Procedure: ESOPHAGOGASTRODUODENOSCOPY (EGD) WITH ESOPHAGEAL DILATION;  Surgeon: Daneil Dolin, MD;  [facility];  Service: Endoscopy;  Laterality: N/A;  possible dilation   ESOPHAGOGASTRODUODENOSCOPY (EGD) WITH PROPOFOL N/A 01/02/2017   Procedure: ESOPHAGOGASTRODUODENOSCOPY (EGD) WITH PROPOFOL;  Surgeon: Daneil Dolin, MD;  [facility];  Service: Endoscopy;  Laterality: N/A;  with possible esophageal dilation   ESOPHAGOGASTRODUODENOSCOPY (EGD) WITH PROPOFOL N/A 04/17/2017   Procedure: ESOPHAGOGASTRODUODENOSCOPY (EGD) WITH PROPOFOL;  Surgeon: Daneil Dolin, MD;  [facility];  Service: Endoscopy;  Laterality: N/A;   EUS N/A 05/09/2013   Procedure: UPPER ENDOSCOPIC ULTRASOUND (EUS) LINEAR;  Surgeon: Milus Banister, MD;  [facility];  Service: Endoscopy;  Laterality: N/A;   FRACTURE SURGERY     pt. denies   IR ANGIO INTRA EXTRACRAN SEL COM CAROTID INNOMINATE BILAT MOD SED  09/06/2022   IR ANGIO INTRA EXTRACRAN SEL COM CAROTID INNOMINATE BILAT MOD SED  09/13/2022   IR ANGIO VERTEBRAL SEL SUBCLAVIAN INNOMINATE BILAT MOD SED  09/13/2022   IR ANGIO VERTEBRAL SEL SUBCLAVIAN INNOMINATE UNI  R MOD SED  09/06/2022   IR ANGIOGRAM FOLLOW UP STUDY  09/13/2022   IR CT HEAD LTD  09/08/2022   IR NEURO EACH ADD'L AFTER BASIC UNI LEFT (MS)  09/13/2022   IR TRANSCATH/EMBOLIZ  09/08/2022   IR US GUIDE VASC ACCESS RIGHT  09/08/2022   IR US GUIDE VASC ACCESS RIGHT  09/06/2022   KNEE ARTHROSCOPY Right    LAPAROSCOPIC CHOLECYSTECTOMY     LEFT HEART CATH AND CORONARY ANGIOGRAPHY N/A 02/26/2018   Procedure: LEFT HEART CATH AND CORONARY ANGIOGRAPHY;  Surgeon: Burnell Blanks, MD;  Location: [place] [facility];  Service: Cardiovascular;  Laterality: N/A;    MALONEY DILATION N/A 04/17/2017   Procedure: Venia Minks DILATION;  Surgeon: Daneil Dolin, MD;  [facility];  Service: Endoscopy;  Laterality: N/A;   PATELLA FRACTURE SURGERY Left    POLYPECTOMY  04/17/2017   Procedure: POLYPECTOMY;  Surgeon: Daneil Dolin, MD;  [facility];  Service: Endoscopy;;   RADIOLOGY WITH ANESTHESIA N/A 09/08/2022   Procedure: L ICA Aneurysm Embolazation;  Surgeon: Luanne Bras, MD;  Location: [place];  Service: Radiology;  Laterality: N/A;   TEE WITHOUT CARDIOVERSION N/A 01/23/2019   Procedure: TRANSESOPHAGEAL ECHOCARDIOGRAM (TEE);  Surgeon: Jerline Pain, MD;  Location[facility];  Service: Cardiovascular;  Laterality: N/A;   [place] N/A 01/19/2018   Procedure: UPPER EXTREMITY ANGIOGRAPHY;  Surgeon: Elam Dutch, MD;  Location: [place] [facility];  Service: Cardiovascular;  Laterality: N/A;   Patient Active Problem List   Diagnosis Date Noted   Internal carotid artery stenosis, left 09/08/2022   Opioid dependence ([place]) 09/02/2022   Obesity, Class II, BMI 35-39.9 09/02/2022   Lung mass 09/02/2022   Impaired glucose tolerance 09/02/2022   Acute ischemic stroke ([place]) 09/01/2022   Mixed hyperlipidemia 09/01/2022   Cavitating mass in right lower lung lobe (PNA Vs Cancer) 01/21/2019   Gastroenteritis 01/20/2019   Left sided cerebral hemisphere cerebrovascular accident (CVA) ([place]) 01/20/2019   Stenosis of left subclavian artery ([place]) 03/19/2018   Chest pain 02/21/2018   Left subclavian artery occlusion 02/21/2018   Rectal bleeding 03/21/2017   Hiatal hernia with gastroesophageal reflux disease and esophagitis    Biliary pain    Pain of upper abdomen    Elevated liver enzymes    Chronic abdominal pain    Pancreatitis 02/06/2015   Acute pancreatitis    Elevated LFTs    Pancreatitis, acute 05/09/2013   Hypokalemia 03/27/2013   Anemia 03/27/2013   Esophageal dysphagia 03/26/2013    Chronic back pain 03/26/2013   GERD (gastroesophageal reflux disease) 03/26/2013   Tobacco use 03/26/2013   Barrett's esophagus with esophagitis 03/26/2013   Elevated lipase 08/20/2011    ONSET DATE: 08/31/2022  REFERRING DIAG: acute ischemic stroke  THERAPY DIAG:  Difficulty in walking, not elsewhere classified  Muscle weakness (generalized)  Rationale for Evaluation and Treatment Rehabilitation  SUBJECTIVE:  SUBJECTIVE STATEMENT: Patient feels she is about the same overall; having trouble with breathing, limb control and balance ; back pain continues; still having issues with right hand  Eval:56 year old female with history of Barrett's esophagus, chronic back pain, chronic abdominal pain, COPD, stroke, hypertension, hyperlipidemia presented with right arm weakness when she woke up on 08/31/2022. Complains now of right side weakness; impaired vision; unable to drive.  Her chief compliant is decreased use of her right hand; has OT evaluation coming up in the next week or 2. Patient working hard to stop smoking; has started smoking cessation with patches.   Pt accompanied by: self  PERTINENT HISTORY:  Principal Problem:   Acute ischemic stroke ([place]) Active Problems:   GERD (gastroesophageal reflux disease)   Tobacco use   Hypokalemia   Mixed hyperlipidemia   Opioid dependence (HCC)   Obesity, Class II, BMI 35-39.9   Lung mass   Impaired glucose tolerance   Internal carotid artery stenosis, left Loss of peripheral vision since stroke  PAIN:  Are you having pain? Yes: NPRS scale: 9/10 Pain location: upper traps and low back Pain description: tight Aggravating factors: unknown Relieving factors: unknown  PRECAUTIONS: Fall  WEIGHT BEARING RESTRICTIONS: No  FALLS: Has patient fallen in last  6 months? No  LIVING ENVIRONMENT: Lives with: lives with an adult companion Lives in: House/apartment Stairs: No Has following equipment at home: None  PLOF: Independent  PATIENT GOALS: get control of the right hand.   OBJECTIVE:   DIAGNOSTIC FINDINGS: CT of head  COGNITION: Overall cognitive status: Within functional limits for tasks assessed   SENSATION: WFL  COORDINATION: Impaired right hand/ upper extremity  EDEMA:  None noted  MUSCLE TONE: wfl     POSTURE: rounded shoulders and forward head  LOWER EXTREMITY MMT's:    Active  Right Eval Left Eval  Hip flexion 4 5  Hip extension 4 4+  Hip abduction    Hip adduction    Hip internal rotation    Hip external rotation    Knee flexion 4- 4+  Knee extension 4+ 5  Ankle dorsiflexion 4 5  Ankle plantarflexion    Ankle inversion    Ankle eversion     (Blank rows = not tested)   BED MOBILITY:  Sit to supine Modified independence Supine to sit Modified independence Rolling to Right Modified independence Rolling to Left Modified independence     GAIT: Gait pattern: antalgic and wide BOS Distance walked: 50 Assistive device utilized: None Level of assistance: SBA Comments: slower than normal gait speed  FUNCTIONAL TESTS:  5 times sit to stand: 12 sec SLS 3" each side  PATIENT SURVEYS:  FOTO 59  TODAY'S TREATMENT:                                                                                  11/09/22 Review of HEP and goals  Sit to stand 2 x 10  Standing: Heel/toe raises 2 x 10 using UE for balance Marching with 1 UE assist 2 x 10 Tandem stance 2 x 10" each no UE assist SLS 2 x 10" no UE assist 4" step ups x 10 each   Prone:  Glute sets 5" x 10 Hamstring curls x 10 each Hip ext x 10 each    DATE: 10/19/22 Physical therapy evaluation and HEP instruction   PATIENT EDUCATION:  Education details: Patient educated on exam findings, POC, scope of PT, HEP. Person educated:  Patient Education method: Explanation, Demonstration, and Handouts Education comprehension: verbalized understanding, returned demonstration, verbal cues required, and tactile cues required   HOME EXERCISE PROGRAM: 11/09/22 prone glute sets, hamstring curls, hip ext Access Code: [email or web address] URL: [email or web address] Date: 10/19/2022 Prepared by: [facility] - [facility]  Exercises - Sit to Stand Without Arm Support  - 2 x daily - 7 x weekly - 2 sets - 10 reps - Standing Single Leg Stance with Counter Support  - 2 x daily - 7 x weekly - 2 sets - 2 reps - 10-30 sec hold - Standing Alternating Knee Flexion  - 2 x daily - 7 x weekly - 2 sets - 10 reps - Heel Toe Raises with Counter Support  - 2 x daily - 7 x weekly - 2 sets - 10 reps - Standing Marching  - 2 x daily - 7 x weekly - 2 sets - 10 reps   GOALS: Goals reviewed with patient? Yes  SHORT TERM GOALS: Target date: 11/02/2022  Patient will be independent with initial HEP  Baseline: Goal status: IN PROGRESS  2.  Patient will improve 5 times sit to stand score from 12 sec to 10 sec to demonstrate improved functional mobility and increased lower extremity strength.  Baseline: 12 sec Goal status: IN PROGRESS   LONG TERM GOALS: Target date: 11/16/2022  Patient will be independent in self management strategies to improve quality of life and functional outcomes.  Baseline:  Goal status: IN PROGRESS  2.  Patient will improve FOTO score to predicted value  Baseline: 59 Goal status: IN PROGRESS  3.  Patient will increase left leg MMTs to 5/5 without pain to promote return to ambulation community distances with minimal deviation.  Baseline: see above Goal status: IN PROGRESS  4.   Patient will report at least 50% improvement in overall symptoms and/or function to demonstrate improved functional mobility  Baseline:  Goal status: IN PROGRESS   ASSESSMENT:  CLINICAL IMPRESSION: Today's session started with a review of HEP  and goals; patient verbalizes agreement with set rehab goals.  Noted some SOB after sit to stand exercise today but O2 sat remains high at 98%.    Updated HEP to include posterior lower extremity strengthening.  Patient continues with complaint of the right side "feeling funny" throughout treatment although she does well with all balance activity today.  Patient will benefit from continued skilled therapy services to address deficits and promote return to optimal function.                 Eval:Patient is a 56 y.o. female who was seen today for physical therapy evaluation and treatment for weakness and balance deficits after ischemic stroke. Patient presents on evaluation with lower extremity weakness right > left; balance deficits, antalgic gait all of which negatively impact her ability to perform tasks around the home; walk in the community and drive.  Patient will benefit from continued skilled therapy services to address deficits and promote return to optimal function.      OBJECTIVE IMPAIRMENTS: Abnormal gait, decreased activity tolerance, decreased balance, decreased coordination, decreased endurance, decreased mobility, difficulty walking, decreased ROM, decreased strength, hypomobility, impaired perceived functional ability, impaired flexibility, impaired UE functional use, impaired vision/preception, and  pain.   ACTIVITY LIMITATIONS: carrying, lifting, bending, sitting, standing, squatting, sleeping, stairs, transfers, dressing, self feeding, reach over head, hygiene/grooming, locomotion level, and caring for others  PARTICIPATION LIMITATIONS: meal prep, cleaning, laundry, driving, shopping, community activity, occupation, and yard work  [employer] POTENTIAL: Good  CLINICAL DECISION MAKING: Stable/uncomplicated  EVALUATION COMPLEXITY: Low  PLAN:  PT FREQUENCY: 1x/week  PT DURATION: 6 weeks  PLANNED INTERVENTIONS: Therapeutic exercises, Therapeutic activity, Neuromuscular re-education,  Balance training, Gait training, Patient/Family education, Joint manipulation, Joint mobilization, Stair training, Orthotic/Fit training, DME instructions, Aquatic Therapy, Dry Needling, Electrical stimulation, Spinal manipulation, Spinal mobilization, Cryotherapy, Moist heat, Compression bandaging, scar mobilization, Splintting, Taping, Traction, Ultrasound, Ionotophoresis '4mg'$ /ml Dexamethasone, and Manual therapy  PLAN FOR NEXT SESSION:  progress lower extremity strengthening and balance as able.   10:19 AM, 11/09/22 Krista Som Small Demetre Monaco MPT [facility] [place] [phone]

## 2022-11-11 ENCOUNTER — Ambulatory Visit (HOSPITAL_COMMUNITY): Payer: Medicare Other | Admitting: Occupational Therapy

## 2022-11-11 ENCOUNTER — Encounter (HOSPITAL_COMMUNITY): Payer: Self-pay | Admitting: Occupational Therapy

## 2022-11-11 DIAGNOSIS — R270 Ataxia, unspecified: Secondary | ICD-10-CM

## 2022-11-11 DIAGNOSIS — R278 Other lack of coordination: Secondary | ICD-10-CM

## 2022-11-11 DIAGNOSIS — M25611 Stiffness of right shoulder, not elsewhere classified: Secondary | ICD-10-CM

## 2022-11-11 DIAGNOSIS — R262 Difficulty in walking, not elsewhere classified: Secondary | ICD-10-CM | POA: Diagnosis not present

## 2022-11-11 NOTE — Therapy (Signed)
OUTPATIENT OCCUPATIONAL THERAPY NEURO EVALUATION  Patient Name: Rita [name] MRN: [id number] DOB:05/21/1966, 56 y.o., female Today's Date: 11/14/2022  PCP: [facility] REFERRING PROVIDER: Aileen Fass, Tammi Klippel  END OF SESSION:  OT End of Session - 11/14/22 1008     Visit Number 1    Number of Visits 17    Date for OT Re-Evaluation 01/06/23    Authorization Type [employer] Medicare, No Copay    Authorization Time Period No visit limit    OT Start Time 0730    OT Stop Time 0815    OT Time Calculation (min) 45 min    Activity Tolerance Patient tolerated treatment well    Behavior During Therapy WFL for tasks assessed/performed             Past Medical History:  Diagnosis Date   Arthritis    "qwhere" (02/22/2018)   Barrett's esophagus with esophagitis 03/26/2013   Chronic abdominal pain    Chronic lower back pain    Chronic pancreatitis (HCC)    COPD (chronic obstructive pulmonary disease) (HCC)    Depression    Dyspnea    GERD (gastroesophageal reflux disease)    Heart murmur    Hiatal hernia    High cholesterol    Hypertension    Peptic ulcer    Pneumonia 2000s X 1   Stroke ([place])    Tobacco use 03/26/2013   Past Surgical History:  Procedure Laterality Date   AORTIC ARCH ANGIOGRAPHY N/A 01/19/2018   Procedure: AORTIC ARCH ANGIOGRAPHY;  Surgeon: Elam Dutch, MD;  Location: [place] [facility];  Service: Cardiovascular;  Laterality: N/A;   BIOPSY  04/17/2017   Procedure: BIOPSY;  Surgeon: Daneil Dolin, MD;  [facility];  Service: Endoscopy;;  esophageal   CARDIAC CATHETERIZATION     CAROTID-SUBCLAVIAN BYPASS GRAFT Left 03/19/2018   Procedure: BYPASS GRAFT CAROTID-SUBCLAVIAN;  Surgeon: Elam Dutch, MD;  Location: [place];  Service: Vascular;  Laterality: Left;   [place]; 1988   COLONOSCOPY WITH PROPOFOL N/A 04/17/2017   Procedure: COLONOSCOPY WITH PROPOFOL;  Surgeon: Daneil Dolin, MD;  [facility];  Service:  Endoscopy;  Laterality: N/A;  100   ESOPHAGOGASTRODUODENOSCOPY  Oct 2011   Dr. Laural Golden: ulcer in distal esophagus, soft stricture at GE junction s/p balloon dilation PATH: BARRETT'S   ESOPHAGOGASTRODUODENOSCOPY (EGD) WITH ESOPHAGEAL DILATION N/A 03/27/2013   Procedure: ESOPHAGOGASTRODUODENOSCOPY (EGD) WITH ESOPHAGEAL DILATION;  Surgeon: Daneil Dolin, MD;  [facility];  Service: Endoscopy;  Laterality: N/A;  possible dilation   ESOPHAGOGASTRODUODENOSCOPY (EGD) WITH PROPOFOL N/A 01/02/2017   Procedure: ESOPHAGOGASTRODUODENOSCOPY (EGD) WITH PROPOFOL;  Surgeon: Daneil Dolin, MD;  [facility];  Service: Endoscopy;  Laterality: N/A;  with possible esophageal dilation   ESOPHAGOGASTRODUODENOSCOPY (EGD) WITH PROPOFOL N/A 04/17/2017   Procedure: ESOPHAGOGASTRODUODENOSCOPY (EGD) WITH PROPOFOL;  Surgeon: Daneil Dolin, MD;  [facility];  Service: Endoscopy;  Laterality: N/A;   EUS N/A 05/09/2013   Procedure: UPPER ENDOSCOPIC ULTRASOUND (EUS) LINEAR;  Surgeon: Milus Banister, MD;  [facility];  Service: Endoscopy;  Laterality: N/A;   FRACTURE SURGERY     pt. denies   IR ANGIO INTRA EXTRACRAN SEL COM CAROTID INNOMINATE BILAT MOD SED  09/06/2022   IR ANGIO INTRA EXTRACRAN SEL COM CAROTID INNOMINATE BILAT MOD SED  09/13/2022   IR ANGIO VERTEBRAL SEL SUBCLAVIAN INNOMINATE BILAT MOD SED  09/13/2022   IR ANGIO VERTEBRAL SEL SUBCLAVIAN INNOMINATE  UNI R MOD SED  09/06/2022   IR ANGIOGRAM FOLLOW UP STUDY  09/13/2022   IR CT HEAD LTD  09/08/2022   IR NEURO EACH ADD'L AFTER BASIC UNI LEFT (MS)  09/13/2022   IR TRANSCATH/EMBOLIZ  09/08/2022   IR US GUIDE VASC ACCESS RIGHT  09/08/2022   IR US GUIDE VASC ACCESS RIGHT  09/06/2022   KNEE ARTHROSCOPY Right    LAPAROSCOPIC CHOLECYSTECTOMY     LEFT HEART CATH AND CORONARY ANGIOGRAPHY N/A 02/26/2018   Procedure: LEFT HEART CATH AND CORONARY ANGIOGRAPHY;  Surgeon: Burnell Blanks, MD;  Location: [place] [facility];  Service:  Cardiovascular;  Laterality: N/A;   MALONEY DILATION N/A 04/17/2017   Procedure: Venia Minks DILATION;  Surgeon: Daneil Dolin, MD;  [facility];  Service: Endoscopy;  Laterality: N/A;   PATELLA FRACTURE SURGERY Left    POLYPECTOMY  04/17/2017   Procedure: POLYPECTOMY;  Surgeon: Daneil Dolin, MD;  [facility];  Service: Endoscopy;;   RADIOLOGY WITH ANESTHESIA N/A 09/08/2022   Procedure: L ICA Aneurysm Embolazation;  Surgeon: Luanne Bras, MD;  Location: [place];  Service: Radiology;  Laterality: N/A;   TEE WITHOUT CARDIOVERSION N/A 01/23/2019   Procedure: TRANSESOPHAGEAL ECHOCARDIOGRAM (TEE);  Surgeon: Jerline Pain, MD;  Location: [facility];  Service: Cardiovascular;  Laterality: N/A;   [place] N/A 01/19/2018   Procedure: UPPER EXTREMITY ANGIOGRAPHY;  Surgeon: Elam Dutch, MD;  Location: [place] [facility];  Service: Cardiovascular;  Laterality: N/A;   Patient Active Problem List   Diagnosis Date Noted   Internal carotid artery stenosis, left 09/08/2022   Opioid dependence ([place]) 09/02/2022   Obesity, Class II, BMI 35-39.9 09/02/2022   Lung mass 09/02/2022   Impaired glucose tolerance 09/02/2022   Acute ischemic stroke ([place]) 09/01/2022   Mixed hyperlipidemia 09/01/2022   Cavitating mass in right lower lung lobe (PNA Vs Cancer) 01/21/2019   Gastroenteritis 01/20/2019   Left sided cerebral hemisphere cerebrovascular accident (CVA) ([place]) 01/20/2019   Stenosis of left subclavian artery ([place]) 03/19/2018   Chest pain 02/21/2018   Left subclavian artery occlusion 02/21/2018   Rectal bleeding 03/21/2017   Hiatal hernia with gastroesophageal reflux disease and esophagitis    Biliary pain    Pain of upper abdomen    Elevated liver enzymes    Chronic abdominal pain    Pancreatitis 02/06/2015   Acute pancreatitis    Elevated LFTs    Pancreatitis, acute 05/09/2013   Hypokalemia 03/27/2013   Anemia 03/27/2013    Esophageal dysphagia 03/26/2013   Chronic back pain 03/26/2013   GERD (gastroesophageal reflux disease) 03/26/2013   Tobacco use 03/26/2013   Barrett's esophagus with esophagitis 03/26/2013   Elevated lipase 08/20/2011    ONSET DATE: 09/01/22  REFERRING DIAG: R sided weakness, s/p L CVA  THERAPY DIAG:  Other lack of coordination  Ataxia  Stiffness of right shoulder, not elsewhere classified  Rationale for Evaluation and Treatment: Rehabilitation  SUBJECTIVE:   SUBJECTIVE STATEMENT: "I can't hold anything in my R hand." Pt accompanied by: self  PERTINENT HISTORY: On 09/01/22 pt woke up with R sided weakness. She was found to have multiple acute infarcts on the L side, as an aneurism. Pt remained hospitalized for 7 days before discharging home.   PRECAUTIONS: None  WEIGHT BEARING RESTRICTIONS: No  PAIN:  Are you having pain? No  FALLS: Has patient fallen in last 6 months? No  LIVING ENVIRONMENT: Lives with: lives  with their partner Lives in: House/apartment  PLOF: Independent  PATIENT GOALS: To get function back in my hand  OBJECTIVE:   HAND DOMINANCE: Right  ADLs: Overall ADLs: Difficulty with zippers and buttons, unable to fully brush her hair, unable to hold pots and pans, difficulty with holding objects for cleaning.  Handwriting: 50% legible and Increased time  MOBILITY STATUS: Independent  POSTURE COMMENTS:  rounded shoulders Sitting balance: Moves/returns truncal midpoint >2 inches in all planes  ACTIVITY TOLERANCE: Activity tolerance: Pt reported increased fatigue and requiring increased rest breaks  FUNCTIONAL OUTCOME MEASURES: FOTO: 45.79  UPPER EXTREMITY ROM:    All ROM is WFL  UPPER EXTREMITY MMT:     MMT Right eval  Shoulder flexion 4-/5  Shoulder abduction 4-/5  Shoulder adduction 4/5  Shoulder extension 4-/5  Shoulder internal rotation 4/5  Shoulder external rotation 3+/5  Elbow flexion 3+/5  Elbow extension 4/5  Wrist  flexion 4+/5  Wrist extension 4+/5  Wrist ulnar deviation 5/5  Wrist radial deviation 5/5  Wrist pronation 5/5  Wrist supination 5/5  (Blank rows = not tested)  HAND FUNCTION: Grip strength: Right: 43 lbs; Left: 27 lbs, Lateral pinch: Right: 3 lbs, Left: 14 lbs, and 3 point pinch: Right: 4 lbs, Left: 14 lbs  COORDINATION: 9 Hole Peg test: Right: 36 sec; Left: 23 sec  SENSATION: Light touch: Impaired   EDEMA: No swelling noted  COGNITION: Overall cognitive status:  Pt reports difficulty with recall and she "draws a blank" when attempting to name certain objects.  VISION: Subjective report: pt reports that she has no peripheral vision in the R eye and it is limited in the L eye from previous stroke. Baseline vision: No visual deficits Visual history:  No visual history  VISION ASSESSMENT: Visual Fields: Right visual field deficits  Patient has difficulty with following activities due to following visual impairments: driving, grocery shopping, walking in the community.  PERCEPTION: WFL  PRAXIS: WFL  OBSERVATIONS: Pt presents with fascial restrictions in her R shoulder, as well as increased stiffness in her L hand, which pt reports is from arthritis   TODAY'S TREATMENT:                                                                                                                              DATE: 11/11/22: Evaluation only   PATIENT EDUCATION: Education details: Educated on the importance of using RUE functionally as much as possible.  Person educated: Patient Education method: Customer service manager Education comprehension: verbalized understanding and returned demonstration  HOME EXERCISE PROGRAM: Will start next session.   GOALS: Goals reviewed with patient? Yes  SHORT TERM GOALS: Target date: 12/09/22  Pt will be provided with and educated on HEP to improve ability to actively use RUE during functional task completion. Goal status: INITIAL  2.  Pt  will decrease fascial restrictions to minimal amounts or less to improve mobility required for reaching tasks.  Goal status: INITIAL  3.  Pt will increase strength in RUE to 4/5 to improve ability to use RUE as active assist during meal prep tasks.  Goal status: INITIAL  4.  Pt will be educated on AE and compensatory strategies  to use during fine motor tasks needed for dressing and bathing ADL's. Goal status: INITIAL   LONG TERM GOALS: Target date: 01/06/23  Pt will increase strength in the RUE to 4+/5 or better in order to improve ability to perform lifting tasks required during cooking and cleaning tasks.  Goal status: INITIAL  2.  Pt will increase right grip strength by 10# and pinch strength by 4# to improve ability to grasp and hold pots and pans during meal preparation. Goal status: INITIAL  3.  Pt will increase fine motor coordination in RUE by completing 9 hole peg test in under 30 seconds to improve ability to perform dressing tasks including operating zippers and buttons.  Goal status: INITIAL   ASSESSMENT:  CLINICAL IMPRESSION: Patient is a 56 y.o. female who was seen today for occupational therapy evaluation for RUE weakness and ataxia s/p L CVA. Prior to the CVA pt reports that she was independent with all ADL's and IADL's. At this time, pt is having increased difficulty with all fine motor tasks, as well as difficulty grasping things and completing basic ADL's such as dressing and bathing. She demonstrates some ataxia with her with her RUE which is creating increased difficulty with her coordination, as well as increased weakness. Pt will benefit from skilled OT to maximize independence and safety with ADL's and IADL's by maximizing strength and coordination.  PERFORMANCE DEFICITS: in functional skills including ADLs, IADLs, coordination, dexterity, ROM, strength, fascial restrictions, muscle spasms, Fine motor control, Gross motor control, body mechanics, and UE functional  use.  IMPAIRMENTS: are limiting patient from ADLs, IADLs, work, leisure, and social participation.   CO-MORBIDITIES: has no other co-morbidities that affects occupational performance. Patient will benefit from skilled OT to address above impairments and improve overall function.  MODIFICATION OR ASSISTANCE TO COMPLETE EVALUATION: No modification of tasks or assist necessary to complete an evaluation.  OT OCCUPATIONAL PROFILE AND HISTORY: Problem focused assessment: Including review of records relating to presenting problem.  CLINICAL DECISION MAKING: LOW - limited treatment options, no task modification necessary  REHAB POTENTIAL: Good  EVALUATION COMPLEXITY: Low    PLAN:  OT FREQUENCY: 2x/week  OT DURATION: 8 weeks  PLANNED INTERVENTIONS: self care/ADL training, therapeutic exercise, therapeutic activity, neuromuscular re-education, manual therapy, passive range of motion, electrical stimulation, ultrasound, moist heat, cryotherapy, patient/family education, and DME and/or AE instructions  RECOMMENDED OTHER SERVICES: N/A  CONSULTED AND AGREED WITH PLAN OF CARE: Patient  PLAN FOR NEXT SESSION: A/ROM, coordination tasks, grip and pinch strength  Paulita Fujita, OTR/L [facility] [place], [place] 11/14/2022, 10:15 AM

## 2022-11-16 ENCOUNTER — Ambulatory Visit (HOSPITAL_COMMUNITY): Payer: Medicare Other

## 2022-11-16 ENCOUNTER — Encounter (HOSPITAL_COMMUNITY): Payer: Self-pay

## 2022-11-16 DIAGNOSIS — R262 Difficulty in walking, not elsewhere classified: Secondary | ICD-10-CM

## 2022-11-16 DIAGNOSIS — M6281 Muscle weakness (generalized): Secondary | ICD-10-CM

## 2022-11-16 NOTE — Therapy (Addendum)
OUTPATIENT PHYSICAL THERAPY NEURO TREATMENT  Progress Note Reporting Period 10/19/22 to 11/16/22  See note below for Objective Data and Assessment of Progress/Goals.     Patient Name: Rita [name] MRN: [id number] DOB:[birth year], 56 y.o., female Today's Date: 11/23/2022   PCP: [facility] REFERRING PROVIDER: Charlynne Cousins, MD      11/16/22 [phone]  PT Visits / Re-Eval  Visit Number 3  Number of Visits 6  Date for PT Re-Evaluation 12/15/22  Authorization  Authorization Type [employer]  Progress Note Due on Visit 10  PT Time Calculation  PT Start Time 0949  PT Stop Time 1028  PT Time Calculation (min) 39 min  PT - End of Session  Activity Tolerance Patient tolerated treatment well  Behavior During Therapy [facility] for tasks assessed/performed     Past Medical History:  Diagnosis Date   Arthritis    "qwhere" (02/22/2018)   Barrett's esophagus with esophagitis 03/26/2013   Chronic abdominal pain    Chronic lower back pain    Chronic pancreatitis (HCC)    COPD (chronic obstructive pulmonary disease) (HCC)    Depression    Dyspnea    GERD (gastroesophageal reflux disease)    Heart murmur    Hiatal hernia    High cholesterol    Hypertension    Peptic ulcer    Pneumonia 2000s X 1   Stroke ([place])    Tobacco use 03/26/2013   Past Surgical History:  Procedure Laterality Date   AORTIC ARCH ANGIOGRAPHY N/A 01/19/2018   Procedure: AORTIC ARCH ANGIOGRAPHY;  Surgeon: Elam Dutch, MD;  Location: [place] [facility];  Service: Cardiovascular;  Laterality: N/A;   BIOPSY  04/17/2017   Procedure: BIOPSY;  Surgeon: Daneil Dolin, MD;  [facility];  Service: Endoscopy;;  esophageal   CARDIAC CATHETERIZATION     CAROTID-SUBCLAVIAN BYPASS GRAFT Left 03/19/2018   Procedure: BYPASS GRAFT CAROTID-SUBCLAVIAN;  Surgeon: Elam Dutch, MD;  Location: [place];  Service: Vascular;  Laterality: Left;   Trainer; 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 Laural Golden: ulcer in distal esophagus, soft stricture at GE junction s/p balloon dilation PATH: BARRETT'S   ESOPHAGOGASTRODUODENOSCOPY (EGD) WITH ESOPHAGEAL DILATION N/A 03/27/2013   Procedure: ESOPHAGOGASTRODUODENOSCOPY (EGD) WITH ESOPHAGEAL DILATION;  Surgeon: Daneil Dolin, MD;  [facility];  Service: Endoscopy;  Laterality: N/A;  possible dilation   ESOPHAGOGASTRODUODENOSCOPY (EGD) WITH PROPOFOL N/A 01/02/2017   Procedure: ESOPHAGOGASTRODUODENOSCOPY (EGD) WITH PROPOFOL;  Surgeon: Daneil Dolin, MD;  [facility];  Service: Endoscopy;  Laterality: N/A;  with possible esophageal dilation   ESOPHAGOGASTRODUODENOSCOPY (EGD) WITH PROPOFOL N/A 04/17/2017   Procedure: ESOPHAGOGASTRODUODENOSCOPY (EGD) WITH PROPOFOL;  Surgeon: Daneil Dolin, MD;  [facility];  Service: Endoscopy;  Laterality: N/A;   EUS N/A 05/09/2013   Procedure: UPPER ENDOSCOPIC ULTRASOUND (EUS) LINEAR;  Surgeon: Milus Banister, MD;  Location: [facility];  Service: Endoscopy;  Laterality: N/A;   FRACTURE SURGERY     pt. denies   IR ANGIO INTRA EXTRACRAN SEL COM CAROTID INNOMINATE BILAT MOD SED  09/06/2022   IR ANGIO INTRA EXTRACRAN SEL COM CAROTID INNOMINATE BILAT MOD SED  09/13/2022   IR ANGIO VERTEBRAL SEL SUBCLAVIAN INNOMINATE BILAT MOD SED  09/13/2022   IR  ANGIO VERTEBRAL SEL SUBCLAVIAN INNOMINATE UNI R MOD SED  09/06/2022   IR ANGIOGRAM FOLLOW UP STUDY  09/13/2022   IR CT HEAD LTD  09/08/2022   IR NEURO EACH ADD'L AFTER BASIC UNI LEFT (MS)  09/13/2022   IR TRANSCATH/EMBOLIZ  09/08/2022   IR US GUIDE VASC ACCESS RIGHT  09/08/2022   IR US GUIDE VASC ACCESS RIGHT  09/06/2022   KNEE ARTHROSCOPY Right    LAPAROSCOPIC CHOLECYSTECTOMY     LEFT HEART CATH AND CORONARY ANGIOGRAPHY N/A 02/26/2018    Procedure: LEFT HEART CATH AND CORONARY ANGIOGRAPHY;  Surgeon: Burnell Blanks, MD;  Location: [place] [facility];  Service: Cardiovascular;  Laterality: N/A;   MALONEY DILATION N/A 04/17/2017   Procedure: Venia Minks DILATION;  Surgeon: Daneil Dolin, MD;  [facility];  Service: Endoscopy;  Laterality: N/A;   PATELLA FRACTURE SURGERY Left    POLYPECTOMY  04/17/2017   Procedure: POLYPECTOMY;  Surgeon: Daneil Dolin, MD;  [facility];  Service: Endoscopy;;   RADIOLOGY WITH ANESTHESIA N/A 09/08/2022   Procedure: L ICA Aneurysm Embolazation;  Surgeon: Luanne Bras, MD;  Location: [place];  Service: Radiology;  Laterality: N/A;   TEE WITHOUT CARDIOVERSION N/A 01/23/2019   Procedure: TRANSESOPHAGEAL ECHOCARDIOGRAM (TEE);  Surgeon: Jerline Pain, MD;  Location: [facility];  Service: Cardiovascular;  Laterality: N/A;   [place] N/A 01/19/2018   Procedure: UPPER EXTREMITY ANGIOGRAPHY;  Surgeon: Elam Dutch, MD;  Location: [place] [facility];  Service: Cardiovascular;  Laterality: N/A;   Patient Active Problem List   Diagnosis Date Noted   Internal carotid artery stenosis, left 09/08/2022   Opioid dependence ([place]) 09/02/2022   Obesity, Class II, BMI 35-39.9 09/02/2022   Lung mass 09/02/2022   Impaired glucose tolerance 09/02/2022   Acute ischemic stroke ([place]) 09/01/2022   Mixed hyperlipidemia 09/01/2022   Cavitating mass in right lower lung lobe (PNA Vs Cancer) 01/21/2019   Gastroenteritis 01/20/2019   Left sided cerebral hemisphere cerebrovascular accident (CVA) ([place]) 01/20/2019   Stenosis of left subclavian artery ([place]) 03/19/2018   Chest pain 02/21/2018   Left subclavian artery occlusion 02/21/2018   Rectal bleeding 03/21/2017   Hiatal hernia with gastroesophageal reflux disease and esophagitis    Biliary pain    Pain of upper abdomen    Elevated liver enzymes    Chronic abdominal pain    Pancreatitis  02/06/2015   Acute pancreatitis    Elevated LFTs    Pancreatitis, acute 05/09/2013   Hypokalemia 03/27/2013   Anemia 03/27/2013   Esophageal dysphagia 03/26/2013   Chronic back pain 03/26/2013   GERD (gastroesophageal reflux disease) 03/26/2013   Tobacco use 03/26/2013   Barrett's esophagus with esophagitis 03/26/2013   Elevated lipase 08/20/2011    ONSET DATE: 08/31/2022  REFERRING DIAG: acute ischemic stroke  THERAPY DIAG:  Difficulty in walking, not elsewhere classified - Plan: PT plan of care cert/re-cert  Muscle weakness (generalized) - Plan: PT plan of care cert/re-cert  Rationale for Evaluation and Treatment Rehabilitation  SUBJECTIVE:  SUBJECTIVE STATEMENT: Pt stated she is feeling good today, no reports of pain or recent falls.  Main difficulty with writing, is Rt handed and feels her balance is off.  Reports compliance with HEP daily.    Eval:56 year old female with history of Barrett's esophagus, chronic back pain, chronic abdominal pain, COPD, stroke, hypertension, hyperlipidemia presented with right arm weakness when she woke up on 08/31/2022. Complains now of right side weakness; impaired vision; unable to drive.  Her chief compliant is decreased use of her right hand; has OT evaluation coming up in the next week or 2. Patient working hard to stop smoking; has started smoking cessation with patches.   Pt accompanied by: self  PERTINENT HISTORY:  Principal Problem:   Acute ischemic stroke ([place]) Active Problems:   GERD (gastroesophageal reflux disease)   Tobacco use   Hypokalemia   Mixed hyperlipidemia   Opioid dependence (HCC)   Obesity, Class II, BMI 35-39.9   Lung mass   Impaired glucose tolerance   Internal carotid artery stenosis, left Loss of peripheral vision since  stroke  PAIN:  Are you having pain? Yes: NPRS scale: 9/10 Pain location: upper traps and low back Pain description: tight Aggravating factors: unknown Relieving factors: unknown  PRECAUTIONS: Fall  WEIGHT BEARING RESTRICTIONS: No  FALLS: Has patient fallen in last 6 months? No  LIVING ENVIRONMENT: Lives with: lives with an adult companion Lives in: House/apartment Stairs: No Has following equipment at home: None  PLOF: Independent  PATIENT GOALS: get control of the right hand.   OBJECTIVE:   DIAGNOSTIC FINDINGS: CT of head  COGNITION: Overall cognitive status: Within functional limits for tasks assessed   SENSATION: WFL  COORDINATION: Impaired right hand/ upper extremity  EDEMA:  None noted  MUSCLE TONE: wfl     POSTURE: rounded shoulders and forward head  LOWER EXTREMITY MMT's:    Active  Right Eval Left Eval Right 11/16/22 Left 11/16/22  Hip flexion 4 5 4+/5 5/5  Hip extension 4 4+ 4+/5 5/5  Hip abduction   4/5 4+/5  Hip adduction      Hip internal rotation      Hip external rotation      Knee flexion 4- 4+ 4/5 5/5  Knee extension 4+ 5 5/5 5/5  Ankle dorsiflexion 4 5 5/5 5/5  Ankle plantarflexion      Ankle inversion      Ankle eversion       (Blank rows = not tested)   BED MOBILITY:  Sit to supine Modified independence Supine to sit Modified independence Rolling to Right Modified independence Rolling to Left Modified independence     GAIT: Gait pattern: antalgic and wide BOS Distance walked: 50 Assistive device utilized: None Level of assistance: SBA Comments: slower than normal gait speed  FUNCTIONAL TESTS:  Eval: 5 times sit to stand: 12 sec 11/16/22: 5STS Eval: SLS 3" each side;  11/16/22: Lt 13", Rt 16" 11/16/22:  PATIENT SURVEYS:  FOTO 59  11/16/22: 61%  TODAY'S TREATMENT:                                                                                  11/16/22 Rockerboard lateral x 2  min Marching alternating  with 1 HHA 20x each Reviewed goals FOTO 61% MMT see above 5STS 8.80" 2MWT 518 ft no AD  Supine: bridge  Standing: Vector stance 3x 5" Squat front of chair SLS Lt 13", Rt 16" Tandem stance 2x 30"  11/09/22 Review of HEP and goals  Sit to stand 2 x 10  Standing: Heel/toe raises 2 x 10 using UE for balance Marching with 1 UE assist 2 x 10 Tandem stance 2 x 10" each no UE assist SLS 2 x 10" no UE assist 4" step ups x 10 each   Prone: Glute sets 5" x 10 Hamstring curls x 10 each Hip ext x 10 each    DATE: 10/19/22 Physical therapy evaluation and HEP instruction   PATIENT EDUCATION:  Education details: Patient educated on exam findings, POC, scope of PT, HEP. Person educated: Patient Education method: Explanation, Demonstration, and Handouts Education comprehension: verbalized understanding, returned demonstration, verbal cues required, and tactile cues required   HOME EXERCISE PROGRAM: 11/16/22: squats, vector stance, SLS and tandem stance 11/09/22 prone glute sets, hamstring curls, hip ext Access Code: [email or web address] URL: [email or web address] Date: 10/19/2022 Prepared by: [facility] - [facility]  Exercises - Sit to Stand Without Arm Support  - 2 x daily - 7 x weekly - 2 sets - 10 reps - Standing Single Leg Stance with Counter Support  - 2 x daily - 7 x weekly - 2 sets - 2 reps - 10-30 sec hold - Standing Alternating Knee Flexion  - 2 x daily - 7 x weekly - 2 sets - 10 reps - Heel Toe Raises with Counter Support  - 2 x daily - 7 x weekly - 2 sets - 10 reps - Standing Marching  - 2 x daily - 7 x weekly - 2 sets - 10 reps   GOALS: Goals reviewed with patient? Yes  SHORT TERM GOALS: Target date: 11/02/2022  Patient will be independent with initial HEP  Baseline: 11/16/22:  Reports compliance with HEP daily Goal status: MET  2.  Patient will improve 5 times sit to stand score from 12 sec to 10 sec to demonstrate improved functional mobility and increased lower  extremity strength.  Baseline: 12 sec: 11/16/22 5 STS 8.80" no HHA Goal status: MET   LONG TERM GOALS: Target date: 12/15/2022  Patient will be independent in self management strategies to improve quality of life and functional outcomes.  Baseline:  Goal status: IN PROGRESS  2.  Patient will improve FOTO score to predicted value  Baseline: 59 Goal status: IN PROGRESS  3.  Patient will increase left leg MMTs to 5/5 without pain to promote return to ambulation community distances with minimal deviation.  Baseline: see above Goal status: IN PROGRESS  4.   Patient will report at least 50% improvement in overall symptoms and/or function to demonstrate improved functional mobility  Baseline: 11/16/22:  Reports 40% improvements, main difficulty with balalnce Goal status: IN PROGRESS   ASSESSMENT:  CLINICAL IMPRESSION: Reviewed goals per cert due today with great progress noted.  Pt reports compliance with current HEP daily and feels she has improved 40% since began therapy, balance continues to be difficult for her.  Pt presents with improvements with all objective testing today including increased time with 5STS, increased cadence with 2MWT with no AD and no LOB, strength has improved all musculature testing and improved FOTO score from 59 to 61% with improved self-perceived functional abilities.  Improved time with SLS though continues to be most  difficult.  Pt given advanced HEP with printout given for improved follow-thru.  No reports of pain through session.                Eval:Patient is a 56 y.o. female who was seen today for physical therapy evaluation and treatment for weakness and balance deficits after ischemic stroke. Patient presents on evaluation with lower extremity weakness right > left; balance deficits, antalgic gait all of which negatively impact her ability to perform tasks around the home; walk in the community and drive.  Patient will benefit from continued skilled  therapy services to address deficits and promote return to optimal function.      OBJECTIVE IMPAIRMENTS: Abnormal gait, decreased activity tolerance, decreased balance, decreased coordination, decreased endurance, decreased mobility, difficulty walking, decreased ROM, decreased strength, hypomobility, impaired perceived functional ability, impaired flexibility, impaired UE functional use, impaired vision/preception, and pain.   ACTIVITY LIMITATIONS: carrying, lifting, bending, sitting, standing, squatting, sleeping, stairs, transfers, dressing, self feeding, reach over head, hygiene/grooming, locomotion level, and caring for others  PARTICIPATION LIMITATIONS: meal prep, cleaning, laundry, driving, shopping, community activity, occupation, and yard work  [employer] POTENTIAL: Good  CLINICAL DECISION MAKING: Stable/uncomplicated  EVALUATION COMPLEXITY: Low  PLAN:  PT FREQUENCY: 1x/week  PT DURATION: 6 weeks  PLANNED INTERVENTIONS: Therapeutic exercises, Therapeutic activity, Neuromuscular re-education, Balance training, Gait training, Patient/Family education, Joint manipulation, Joint mobilization, Stair training, Orthotic/Fit training, DME instructions, Aquatic Therapy, Dry Needling, Electrical stimulation, Spinal manipulation, Spinal mobilization, Cryotherapy, Moist heat, Compression bandaging, scar mobilization, Splintting, Taping, Traction, Ultrasound, Ionotophoresis 72m/ml Dexamethasone, and Manual therapy  PLAN FOR NEXT SESSION:  progress lower extremity strengthening and balance as able.  Next session begin quadruped UE/LE, head turns, dynamic surface tandem stance and gait as well as functional strengthening with stairs and carrying objects.  CIhor Austin LPTA/CLT; CBIS [phone] 9:02 AM, 11/23/22  9:02 AM, 11/23/22 Amy Small Lynch MPT [place] [facility] [place] [address][phone]

## 2022-11-19 NOTE — Addendum Note (Signed)
Addended byKarn Cassis S on: 11/19/2022 11:38 AM   Modules accepted: Orders

## 2022-11-23 ENCOUNTER — Encounter (HOSPITAL_COMMUNITY): Payer: Medicare Other | Admitting: Physical Therapy

## 2022-11-30 ENCOUNTER — Encounter (HOSPITAL_COMMUNITY): Payer: Medicare Other | Admitting: Occupational Therapy

## 2022-12-01 ENCOUNTER — Other Ambulatory Visit: Payer: Self-pay | Admitting: *Deleted

## 2022-12-01 DIAGNOSIS — I6523 Occlusion and stenosis of bilateral carotid arteries: Secondary | ICD-10-CM

## 2022-12-02 ENCOUNTER — Telehealth (HOSPITAL_COMMUNITY): Payer: Self-pay | Admitting: Occupational Therapy

## 2022-12-02 ENCOUNTER — Encounter (HOSPITAL_COMMUNITY): Payer: Medicare Other | Admitting: Occupational Therapy

## 2022-12-02 NOTE — Telephone Encounter (Signed)
OT called pt and left a VM regarding 2 missed appointments. Informed her that she will need to schedule 1 appointment at a time, and if next appointment on the 19th is missed, we will need a new referral from the MD.

## 2022-12-06 ENCOUNTER — Encounter (HOSPITAL_COMMUNITY): Payer: Medicare Other | Admitting: Occupational Therapy

## 2022-12-06 NOTE — Progress Notes (Unsigned)
Office Note     CC:  follow up Requesting Provider:  [facility]  HPI: Rita [name] is a 56 y.o. ([birth year]) female who presents for follow up carotid artery stenosis. She has history of left carotid to subclavian artery bypass with Dacron graft by Dr. Oneida Alar performed in 2019.  Her last follow-up with [place] was in 2022. Prior to this visit she had been to the ER due to right lower extremity weakness and left facial droop.  An MRI was performed which demonstrated old occipital infarcts no new strokes.  At time of visit she felt she still had some left lip droop but had her strength back in her lower extremities. Her bypass graft was patent and continued medical management was recommended  Today she reports ***   The pt is on a statin for cholesterol management.  The pt is on a daily aspirin.   Other AC:  Plavix The pt *** on *** for hypertension.   The pt *** diabetic.  *** Tobacco hx:  ***  Past Medical History:  Diagnosis Date   Arthritis    "qwhere" (02/22/2018)   Barrett's esophagus with esophagitis 03/26/2013   Chronic abdominal pain    Chronic lower back pain    Chronic pancreatitis (HCC)    COPD (chronic obstructive pulmonary disease) (HCC)    Depression    Dyspnea    GERD (gastroesophageal reflux disease)    Heart murmur    Hiatal hernia    High cholesterol    Hypertension    Peptic ulcer    Pneumonia 2000s X 1   Stroke ([place])    Tobacco use 03/26/2013    Past Surgical History:  Procedure Laterality Date   AORTIC ARCH ANGIOGRAPHY N/A 01/19/2018   Procedure: AORTIC ARCH ANGIOGRAPHY;  Surgeon: Elam Dutch, MD;  Location: [place];  Service: Cardiovascular;  Laterality: N/A;   BIOPSY  04/17/2017   Procedure: BIOPSY;  Surgeon: Daneil Dolin, MD;  [facility];  Service: Endoscopy;;  esophageal   CARDIAC CATHETERIZATION     CAROTID-SUBCLAVIAN BYPASS GRAFT Left 03/19/2018   Procedure: BYPASS GRAFT CAROTID-SUBCLAVIAN;  Surgeon:  Elam Dutch, MD;  Location: [place];  Service: Vascular;  Laterality: Left;   [place]; 1988   COLONOSCOPY WITH PROPOFOL N/A 04/17/2017   Procedure: COLONOSCOPY WITH PROPOFOL;  Surgeon: Daneil Dolin, MD;  [facility];  Service: Endoscopy;  Laterality: N/A;  100   ESOPHAGOGASTRODUODENOSCOPY  Oct 2011   Dr. Laural Golden: ulcer in distal esophagus, soft stricture at GE junction s/p balloon dilation PATH: BARRETT'S   ESOPHAGOGASTRODUODENOSCOPY (EGD) WITH ESOPHAGEAL DILATION N/A 03/27/2013   Procedure: ESOPHAGOGASTRODUODENOSCOPY (EGD) WITH ESOPHAGEAL DILATION;  Surgeon: Daneil Dolin, MD;  [facility];  Service: Endoscopy;  Laterality: N/A;  possible dilation   ESOPHAGOGASTRODUODENOSCOPY (EGD) WITH PROPOFOL N/A 01/02/2017   Procedure: ESOPHAGOGASTRODUODENOSCOPY (EGD) WITH PROPOFOL;  Surgeon: Daneil Dolin, MD;  [facility];  Service: Endoscopy;  Laterality: N/A;  with possible esophageal dilation   ESOPHAGOGASTRODUODENOSCOPY (EGD) WITH PROPOFOL N/A 04/17/2017   Procedure: ESOPHAGOGASTRODUODENOSCOPY (EGD) WITH PROPOFOL;  Surgeon: Daneil Dolin, MD;  [facility];  Service: Endoscopy;  Laterality: N/A;   EUS N/A 05/09/2013   Procedure: UPPER ENDOSCOPIC ULTRASOUND (EUS) LINEAR;  Surgeon: Milus Banister, MD;  [facility];  Service: Endoscopy;  Laterality: N/A;   FRACTURE SURGERY     pt. denies   IR ANGIO INTRA EXTRACRAN  SEL COM CAROTID INNOMINATE BILAT MOD SED  09/06/2022   IR ANGIO INTRA EXTRACRAN SEL COM CAROTID INNOMINATE BILAT MOD SED  09/13/2022   IR ANGIO VERTEBRAL SEL SUBCLAVIAN INNOMINATE BILAT MOD SED  09/13/2022   IR ANGIO VERTEBRAL SEL SUBCLAVIAN INNOMINATE UNI R MOD SED  09/06/2022   IR ANGIOGRAM FOLLOW UP STUDY  09/13/2022   IR CT HEAD LTD  09/08/2022   IR NEURO EACH ADD'L AFTER BASIC UNI LEFT (MS)  09/13/2022   IR TRANSCATH/EMBOLIZ  09/08/2022   IR US GUIDE VASC ACCESS RIGHT  09/08/2022   IR US GUIDE VASC ACCESS RIGHT   09/06/2022   KNEE ARTHROSCOPY Right    LAPAROSCOPIC CHOLECYSTECTOMY     LEFT HEART CATH AND CORONARY ANGIOGRAPHY N/A 02/26/2018   Procedure: LEFT HEART CATH AND CORONARY ANGIOGRAPHY;  Surgeon: Burnell Blanks, MD;  Location: [facility];  Service: Cardiovascular;  Laterality: N/A;   MALONEY DILATION N/A 04/17/2017   Procedure: Venia Minks DILATION;  Surgeon: Daneil Dolin, MD;  [facility];  Service: Endoscopy;  Laterality: N/A;   PATELLA FRACTURE SURGERY Left    POLYPECTOMY  04/17/2017   Procedure: POLYPECTOMY;  Surgeon: Daneil Dolin, MD;  [facility];  Service: Endoscopy;;   RADIOLOGY WITH ANESTHESIA N/A 09/08/2022   Procedure: L ICA Aneurysm Embolazation;  Surgeon: Luanne Bras, MD;  Location: [facility];  Service: Radiology;  Laterality: N/A;   TEE WITHOUT CARDIOVERSION N/A 01/23/2019   Procedure: TRANSESOPHAGEAL ECHOCARDIOGRAM (TEE);  Surgeon: Jerline Pain, MD;  Location: [facility];  Service: Cardiovascular;  Laterality: N/A;   [place] N/A 01/19/2018   Procedure: UPPER EXTREMITY ANGIOGRAPHY;  Surgeon: Elam Dutch, MD;  Location: [facility];  Service: Cardiovascular;  Laterality: N/A;    Social History   Socioeconomic History   Marital status: Divorced    Spouse name: Not on file   Number of children: Not on file   Years of education: Not on file   Highest education level: Not on file  Occupational History   Occupation: farm  Tobacco Use   Smoking status: Every Day    Packs/day: 0.50    Years: 41.00    Total pack years: 20.50    Types: Cigarettes    Start date: 09/26/1982   Smokeless tobacco: Never  Vaping Use   Vaping Use: Former  Substance and Sexual Activity   Alcohol use: No   Drug use: No   Sexual activity: Not Currently    Partners: Male    Birth control/protection: Surgical  Other Topics Concern   Not on file  Social History Narrative   Lives in [place], [place].   Is  on disability, was a Psychologist, sport and exercise and drove tractors.    Wears seatbelt.   Cannot eat meat due to esophagus.   Smokes cigarettes.   Drinks sodas, 1/2 liter a day.    3 boys and 1 girl. 2 boys live in [place]. 1 son died in 64th month of pregnancy.   Live with friend and his wife at this time.   Used to be married and was abandoned, he would not give her a divorce.   Right handed      Social Determinants of Health   Financial Resource Strain: Not on file  Food Insecurity: Not on file  Transportation Needs: Not on file  Physical Activity: Not on file  Stress: Not on file  Social Connections: Not on file  Intimate Partner Violence: Not on file   *** Family History  Problem Relation Age of Onset   Asthma Mother    Heart failure Mother    Cancer Mother        pancreatic   Diabetes Mother    Hypertension Mother    Stroke Mother    Pancreatic cancer Mother        deceased   Heart failure Father    Diabetes Father    Colon cancer Neg Hx     Current Outpatient Medications  Medication Sig Dispense Refill   aspirin EC 81 MG tablet Take 1 tablet (81 mg total) by mouth daily. Swallow whole. 30 tablet 12   atorvastatin (LIPITOR) 80 MG tablet Take 1 tablet (80 mg total) by mouth daily at 6 PM. 30 tablet 0   Buprenorphine HCl-Naloxone HCl 8-2 MG FILM Take 1 Film by mouth 3 (three) times daily.     colchicine 0.6 MG tablet Take 1 tablet (0.6 mg total) by mouth 2 (two) times daily for 7 days. (Patient not taking: Reported on 09/01/2022) 14 tablet 0   DEXILANT 60 MG capsule TAKE 1 CAPSULE(60 MG) BY MOUTH AT BEDTIME (Patient taking differently: Take 60 mg by mouth daily.) 90 capsule 3   ezetimibe (ZETIA) 10 MG tablet Take 1 tablet (10 mg total) by mouth daily. 30 tablet 2   potassium chloride (MICRO-K) 10 MEQ CR capsule Take 10 mEq by mouth 2 (two) times daily.     ticagrelor (BRILINTA) 90 MG TABS tablet Take 1 tablet (90 mg total) by mouth 2 (two) times daily. 60 tablet 3   No current  facility-administered medications for this visit.    No Known Allergies   REVIEW OF SYSTEMS:  *** '[X]'$  denotes positive finding, '[ ]'$  denotes negative finding Cardiac  Comments:  Chest pain or chest pressure:    Shortness of breath upon exertion:    Short of breath when lying flat:    Irregular heart rhythm:        Vascular    Pain in calf, thigh, or hip brought on by ambulation:    Pain in feet at night that wakes you up from your sleep:     Blood clot in your veins:    Leg swelling:         Pulmonary    Oxygen at home:    Productive cough:     Wheezing:         Neurologic    Sudden weakness in arms or legs:     Sudden numbness in arms or legs:     Sudden onset of difficulty speaking or slurred speech:    Temporary loss of vision in one eye:     Problems with dizziness:         Gastrointestinal    Blood in stool:     Vomited blood:         Genitourinary    Burning when urinating:     Blood in urine:        Psychiatric    Major depression:         Hematologic    Bleeding problems:    Problems with blood clotting too easily:        Skin    Rashes or ulcers:        Constitutional    Fever or chills:      PHYSICAL EXAMINATION:  There were no vitals filed for this visit.  General:  WDWN  in NAD; vital signs documented above Gait: Not observed HENT: WNL, normocephalic Pulmonary: normal non-labored breathing , without Rales, rhonchi,  wheezing Cardiac: {Desc; regular/irreg:14544} HR, without  Murmurs {With/Without:20273} carotid bruit*** Abdomen: soft, NT, no masses Skin: {With/Without:20273} rashes Vascular Exam/Pulses:  Right Left  Radial {Exam; arterial pulse strength 0-4:30167} {Exam; arterial pulse strength 0-4:30167}  Ulnar {Exam; arterial pulse strength 0-4:30167} {Exam; arterial pulse strength 0-4:30167}  Femoral {Exam; arterial pulse strength 0-4:30167} {Exam; arterial pulse strength 0-4:30167}  Popliteal {Exam; arterial pulse strength 0-4:30167}  {Exam; arterial pulse strength 0-4:30167}  DP {Exam; arterial pulse strength 0-4:30167} {Exam; arterial pulse strength 0-4:30167}  PT {Exam; arterial pulse strength 0-4:30167} {Exam; arterial pulse strength 0-4:30167}   Extremities: {With/Without:20273} ischemic changes, {With/Without:20273} Gangrene , {With/Without:20273} cellulitis; {With/Without:20273} open wounds;  Musculoskeletal: no muscle wasting or atrophy  Neurologic: A&O X 3;  No focal weakness or paresthesias are detected Psychiatric:  The pt has {Desc; normal/abnormal:11317::"Normal"} affect.   Non-Invasive Vascular Imaging:   ***    ASSESSMENT/PLAN:: 56 y.o. female here for follow up for ***   -***   Karoline Caldwell, PA-C [facility] MD:   [facility]

## 2022-12-07 ENCOUNTER — Ambulatory Visit: Payer: Medicare Other

## 2022-12-07 ENCOUNTER — Ambulatory Visit (HOSPITAL_COMMUNITY): Admission: RE | Admit: 2022-12-07 | Payer: Medicare Other | Source: Ambulatory Visit

## 2022-12-07 DIAGNOSIS — I771 Stricture of artery: Secondary | ICD-10-CM

## 2022-12-07 DIAGNOSIS — I6523 Occlusion and stenosis of bilateral carotid arteries: Secondary | ICD-10-CM

## 2022-12-08 ENCOUNTER — Encounter (HOSPITAL_COMMUNITY): Payer: Medicare Other | Admitting: Occupational Therapy

## 2022-12-13 ENCOUNTER — Encounter (HOSPITAL_COMMUNITY): Payer: Medicare Other | Admitting: Occupational Therapy

## 2022-12-14 ENCOUNTER — Telehealth (HOSPITAL_COMMUNITY): Payer: Self-pay | Admitting: Physical Therapy

## 2022-12-14 ENCOUNTER — Encounter (HOSPITAL_COMMUNITY): Payer: Self-pay | Admitting: Physical Therapy

## 2022-12-14 NOTE — Therapy (Signed)
[facility] [place], [place], [address] Phone: [phone]   Fax:  [phone]  Patient Details  Name: SIANNE TEJADA MRN: [id number] Date of Birth: April 24, 1966 Referring Provider:  No ref. provider found  Encounter Date: 12/14/2022 PHYSICAL THERAPY DISCHARGE SUMMARY  Visits from Start of Care: 3  Current functional level related to goals / functional outcomes: Unknown as pt did not return    Remaining deficits: Unknown as pt did not return    Education / Equipment: HEP   Patient agrees to discharge. Patient goals were not met. Patient is being discharged due to not returning since the last visit.  Rayetta Humphrey, PT CLT [phone]  12/14/2022, 12:04 PM  [facility] [address] [place], [place], [address] Phone: [phone]   Fax:  [phone]

## 2022-12-14 NOTE — Telephone Encounter (Signed)
Entered in error should have been documentation for discharge. Rayetta Humphrey, [place] CLT [phone]

## 2022-12-15 ENCOUNTER — Encounter (HOSPITAL_COMMUNITY): Payer: Medicare Other | Admitting: Occupational Therapy

## 2022-12-20 ENCOUNTER — Encounter (HOSPITAL_COMMUNITY): Payer: Self-pay | Admitting: Occupational Therapy

## 2022-12-20 ENCOUNTER — Ambulatory Visit (HOSPITAL_COMMUNITY): Payer: Medicare Other | Attending: Internal Medicine | Admitting: Occupational Therapy

## 2022-12-20 DIAGNOSIS — M25611 Stiffness of right shoulder, not elsewhere classified: Secondary | ICD-10-CM | POA: Insufficient documentation

## 2022-12-20 DIAGNOSIS — R278 Other lack of coordination: Secondary | ICD-10-CM | POA: Insufficient documentation

## 2022-12-20 NOTE — Therapy (Signed)
OUTPATIENT OCCUPATIONAL THERAPY NEURO TREATMENT AND DISCHARGE SUMMARY   Patient Name: Rita [name] MRN: [id number] DOB:12-06-1966, 56 y.o., female Today's Date: 12/20/2022  PCP: [facility] REFERRING PROVIDER: Aileen Fass, Tammi Klippel  END OF SESSION:  OT End of Session - 12/20/22 1455     Visit Number 2    Number of Visits 17    Date for OT Re-Evaluation 01/06/23    Authorization Type [employer] Medicare, No Copay    Authorization Time Period No visit limit    OT Start Time 1430    OT Stop Time 1448    OT Time Calculation (min) 18 min    Activity Tolerance Patient tolerated treatment well    Behavior During Therapy WFL for tasks assessed/performed              Past Medical History:  Diagnosis Date   Arthritis    "qwhere" (02/22/2018)   Barrett's esophagus with esophagitis 03/26/2013   Chronic abdominal pain    Chronic lower back pain    Chronic pancreatitis (HCC)    COPD (chronic obstructive pulmonary disease) (HCC)    Depression    Dyspnea    GERD (gastroesophageal reflux disease)    Heart murmur    Hiatal hernia    High cholesterol    Hypertension    Peptic ulcer    Pneumonia 2000s X 1   Stroke ([place])    Tobacco use 03/26/2013   Past Surgical History:  Procedure Laterality Date   AORTIC ARCH ANGIOGRAPHY N/A 01/19/2018   Procedure: AORTIC ARCH ANGIOGRAPHY;  Surgeon: Elam Dutch, MD;  Location: [facility];  Service: Cardiovascular;  Laterality: N/A;   BIOPSY  04/17/2017   Procedure: BIOPSY;  Surgeon: Daneil Dolin, MD;  [facility];  Service: Endoscopy;;  esophageal   CARDIAC CATHETERIZATION     CAROTID-SUBCLAVIAN BYPASS GRAFT Left 03/19/2018   Procedure: BYPASS GRAFT CAROTID-SUBCLAVIAN;  Surgeon: Elam Dutch, MD;  Location: [facility];  Service: Vascular;  Laterality: Left;   [place]; 1988   COLONOSCOPY WITH PROPOFOL N/A 04/17/2017   Procedure: COLONOSCOPY WITH PROPOFOL;  Surgeon: Daneil Dolin, MD;  [facility];  Service: Endoscopy;  Laterality: N/A;  100   ESOPHAGOGASTRODUODENOSCOPY  Oct 2011   Dr. Laural Golden: ulcer in distal esophagus, soft stricture at GE junction s/p balloon dilation PATH: BARRETT'S   ESOPHAGOGASTRODUODENOSCOPY (EGD) WITH ESOPHAGEAL DILATION N/A 03/27/2013   Procedure: ESOPHAGOGASTRODUODENOSCOPY (EGD) WITH ESOPHAGEAL DILATION;  Surgeon: Daneil Dolin, MD;  [facility];  Service: Endoscopy;  Laterality: N/A;  possible dilation   ESOPHAGOGASTRODUODENOSCOPY (EGD) WITH PROPOFOL N/A 01/02/2017   Procedure: ESOPHAGOGASTRODUODENOSCOPY (EGD) WITH PROPOFOL;  Surgeon: Daneil Dolin, MD;  [facility];  Service: Endoscopy;  Laterality: N/A;  with possible esophageal dilation   ESOPHAGOGASTRODUODENOSCOPY (EGD) WITH PROPOFOL N/A 04/17/2017   Procedure: ESOPHAGOGASTRODUODENOSCOPY (EGD) WITH PROPOFOL;  Surgeon: Daneil Dolin, MD;  [facility];  Service: Endoscopy;  Laterality: N/A;   EUS N/A 05/09/2013   Procedure: UPPER ENDOSCOPIC ULTRASOUND (EUS) LINEAR;  Surgeon: Milus Banister, MD;  [facility];  Service: Endoscopy;  Laterality: N/A;   FRACTURE SURGERY     pt. denies   IR ANGIO INTRA EXTRACRAN SEL COM CAROTID INNOMINATE BILAT MOD SED  09/06/2022   IR ANGIO INTRA EXTRACRAN SEL COM CAROTID INNOMINATE BILAT MOD SED  09/13/2022   IR ANGIO VERTEBRAL SEL SUBCLAVIAN INNOMINATE BILAT MOD SED  09/13/2022   IR  ANGIO VERTEBRAL SEL SUBCLAVIAN INNOMINATE UNI R MOD SED  09/06/2022   IR ANGIOGRAM FOLLOW UP STUDY  09/13/2022   IR CT HEAD LTD  09/08/2022   IR NEURO EACH ADD'L AFTER BASIC UNI LEFT (MS)  09/13/2022   IR TRANSCATH/EMBOLIZ  09/08/2022   IR US GUIDE VASC ACCESS RIGHT  09/08/2022   IR US GUIDE VASC ACCESS RIGHT  09/06/2022   KNEE ARTHROSCOPY Right    LAPAROSCOPIC CHOLECYSTECTOMY     LEFT HEART CATH AND CORONARY ANGIOGRAPHY N/A 02/26/2018   Procedure: LEFT HEART CATH AND CORONARY ANGIOGRAPHY;  Surgeon: Burnell Blanks, MD;  Location: [facility];  Service: Cardiovascular;  Laterality: N/A;   MALONEY DILATION N/A 04/17/2017   Procedure: Venia Minks DILATION;  Surgeon: Daneil Dolin, MD;  [facility];  Service: Endoscopy;  Laterality: N/A;   PATELLA FRACTURE SURGERY Left    POLYPECTOMY  04/17/2017   Procedure: POLYPECTOMY;  Surgeon: Daneil Dolin, MD;  [facility];  Service: Endoscopy;;   RADIOLOGY WITH ANESTHESIA N/A 09/08/2022   Procedure: L ICA Aneurysm Embolazation;  Surgeon: Luanne Bras, MD;  Location: [facility];  Service: Radiology;  Laterality: N/A;   TEE WITHOUT CARDIOVERSION N/A 01/23/2019   Procedure: TRANSESOPHAGEAL ECHOCARDIOGRAM (TEE);  Surgeon: Jerline Pain, MD;  Location: [facility];  Service: Cardiovascular;  Laterality: N/A;   [place] N/A 01/19/2018   Procedure: UPPER EXTREMITY ANGIOGRAPHY;  Surgeon: Elam Dutch, MD;  Location: [facility];  Service: Cardiovascular;  Laterality: N/A;   Patient Active Problem List   Diagnosis Date Noted   Internal carotid artery stenosis, left 09/08/2022   Opioid dependence ([place]) 09/02/2022   Obesity, Class II, BMI 35-39.9 09/02/2022   Lung mass 09/02/2022   Impaired glucose tolerance 09/02/2022   Acute ischemic stroke ([place]) 09/01/2022   Mixed hyperlipidemia 09/01/2022   Cavitating mass in right lower lung lobe (PNA Vs Cancer) 01/21/2019   Gastroenteritis 01/20/2019   Left sided cerebral hemisphere cerebrovascular accident (CVA) ([place]) 01/20/2019   Stenosis of left subclavian artery ([place]) 03/19/2018   Chest pain 02/21/2018   Left subclavian artery occlusion 02/21/2018   Rectal bleeding 03/21/2017   Hiatal hernia with gastroesophageal reflux disease and esophagitis    Biliary pain    Pain of upper abdomen    Elevated liver enzymes    Chronic abdominal pain    Pancreatitis 02/06/2015   Acute pancreatitis    Elevated LFTs    Pancreatitis, acute 05/09/2013   Hypokalemia 03/27/2013   Anemia  03/27/2013   Esophageal dysphagia 03/26/2013   Chronic back pain 03/26/2013   GERD (gastroesophageal reflux disease) 03/26/2013   Tobacco use 03/26/2013   Barrett's esophagus with esophagitis 03/26/2013   Elevated lipase 08/20/2011    ONSET DATE: 09/01/22  REFERRING DIAG: R sided weakness, s/p L CVA  THERAPY DIAG:  Other lack of coordination  Stiffness of right shoulder, not elsewhere classified  Rationale for Evaluation and Treatment: Rehabilitation  SUBJECTIVE:   SUBJECTIVE STATEMENT: "I'm just having trouble with my handwriting now."  Pt accompanied by: self  PERTINENT HISTORY: On 09/01/22 pt woke up with R sided weakness. She was found to have multiple acute infarcts on the L side, as an aneurism. Pt remained hospitalized for 7 days before discharging home.   PRECAUTIONS: None  WEIGHT BEARING RESTRICTIONS: No  PAIN:  Are you having pain? No  FALLS: Has patient fallen in last 6 months? No  LIVING  ENVIRONMENT: Lives with: lives with their partner Lives in: House/apartment  PLOF: Independent  PATIENT GOALS: To get function back in my hand  OBJECTIVE:   HAND DOMINANCE: Right  ADLs: Overall ADLs: Difficulty with zippers and buttons, unable to fully brush her hair, unable to hold pots and pans, difficulty with holding objects for cleaning.  Handwriting: 50% legible and Increased time  MOBILITY STATUS: Independent  POSTURE COMMENTS:  rounded shoulders Sitting balance: Moves/returns truncal midpoint >2 inches in all planes  ACTIVITY TOLERANCE: Activity tolerance: Pt reported increased fatigue and requiring increased rest breaks  FUNCTIONAL OUTCOME MEASURES: FOTO: 45.79  UPPER EXTREMITY ROM:    All ROM is [facility]  UPPER EXTREMITY MMT:     MMT Right eval Right 12/20/22  Shoulder flexion 4-/5 4+/5  Shoulder abduction 4-/5 4+/5  Shoulder internal rotation 4/5 5/5  Shoulder external rotation 3+/5 5/5  Elbow flexion 3+/5 5/5  Elbow extension 4/5 5/5   Wrist flexion 4+/5 5/5  Wrist extension 4+/5 5/5  Wrist ulnar deviation 5/5   Wrist radial deviation 5/5   Wrist pronation 5/5   Wrist supination 5/5   (Blank rows = not tested)  HAND FUNCTION: Grip strength: Right: 43 lbs; Left: 27 lbs, Lateral pinch: Right: 3 lbs, Left: 14 lbs, and 3 point pinch: Right: 4 lbs, Left: 14 lbs Grip strength: Right: 62 lbs; Lateral pinch: Right: 15 lbs; and 3 point pinch: Right: 14 lbs  COORDINATION: 9 Hole Peg test: Right: 36 sec; Left: 23 sec 9 Hole Peg test: Right: 28 sec  SENSATION: Light touch: Impaired   COGNITION: Overall cognitive status:  Pt reports difficulty with recall and she "draws a blank" when attempting to name certain objects.  VISIO12/26/23N: Subjective report: pt reports that she has no peripheral vision in the R eye and it is limited in the L eye from previous stroke. Baseline vision: No visual deficits Visual history:  No visual history  VISION ASSESSMENT: Visual Fields: Right visual field deficits  Patient has difficulty with following activities due to following visual impairments: driving, grocery shopping, walking in the community.  OBSERVATIONS: Pt presents with fascial restrictions in her R shoulder, as well as increased stiffness in her L hand, which pt reports is from arthritis   TODAY'S TREATMENT:                                                                                                                              DATE:  12/20/22 -Discussed handwriting practice and pencil control, home strategies for improvement in motor planning to improve handwriting   PATIENT EDUCATION: Education details: handwriting and Psychiatrist Person educated: Patient Education method: Explanation and Demonstration Education comprehension: verbalized understanding and returned demonstration  HOME EXERCISE PROGRAM: Will start next session.   GOALS: Goals reviewed with patient? Yes  SHORT TERM GOALS: Target  date: 12/09/22  Pt will be provided with and educated on HEP to improve ability to actively use RUE during  functional task completion. Goal status: MET  2.  Pt will decrease fascial restrictions to minimal amounts or less to improve mobility required for reaching tasks.  Goal status: MET  3.  Pt will increase strength in RUE to 4/5 to improve ability to use RUE as active assist during meal prep tasks.  Goal status: MET  4.  Pt will be educated on AE and compensatory strategies  to use during fine motor tasks needed for dressing and bathing ADL's. Goal status: MET   LONG TERM GOALS: Target date: 01/06/23  Pt will increase strength in the RUE to 4+/5 or better in order to improve ability to perform lifting tasks required during cooking and cleaning tasks.  Goal status: MET  2.  Pt will increase right grip strength by 10# and pinch strength by 4# to improve ability to grasp and hold pots and pans during meal preparation. Goal status: MET  3.  Pt will increase fine motor coordination in RUE by completing 9 hole peg test in under 30 seconds to improve ability to perform dressing tasks including operating zippers and buttons.  Goal status: MET   ASSESSMENT:  CLINICAL IMPRESSION: Reassessment completed today as pt has not been seen since 11/11/22. Pt demonstrates improvement in strength and coordination throughout RUE, has met all goals, and reports her only difficulty now is her handwriting. Discussed handwriting strategies and reviewed list of daily practice items. Pt is agreeable to discharge today with HEP for handwriting.   PERFORMANCE DEFICITS: in functional skills including ADLs, IADLs, coordination, dexterity, ROM, strength, fascial restrictions, muscle spasms, Fine motor control, Gross motor control, body mechanics, and UE functional use.  PLAN:  OT FREQUENCY: 2x/week  OT DURATION: 8 weeks  PLANNED INTERVENTIONS: self care/ADL training, therapeutic exercise, therapeutic  activity, neuromuscular re-education, manual therapy, passive range of motion, electrical stimulation, ultrasound, moist heat, cryotherapy, patient/family education, and DME and/or AE instructions   CONSULTED AND AGREED WITH PLAN OF CARE: Patient  PLAN FOR NEXT SESSION: N/A-discharge    OCCUPATIONAL THERAPY DISCHARGE SUMMARY  Visits from Start of Care: 2  Current functional level related to goals / functional outcomes: See above. Pt has met all goals and has improve RUE functioning.    Remaining deficits: Handwriting difficulties   Education / Equipment: HEP   Patient agrees to discharge. Patient goals were met. Patient is being discharged due to meeting the stated rehab goals.Guadelupe Sabin, OTR/L  [phone] 12/20/2022, 2:55 PM

## 2022-12-21 ENCOUNTER — Encounter (HOSPITAL_COMMUNITY): Payer: Medicare Other | Admitting: Occupational Therapy

## 2022-12-23 ENCOUNTER — Encounter (HOSPITAL_COMMUNITY): Payer: Medicare Other | Admitting: Occupational Therapy

## 2022-12-27 ENCOUNTER — Encounter (HOSPITAL_COMMUNITY): Payer: Medicare Other | Admitting: Occupational Therapy

## 2022-12-29 ENCOUNTER — Encounter (HOSPITAL_COMMUNITY): Payer: Medicare Other | Admitting: Occupational Therapy

## 2023-01-03 ENCOUNTER — Encounter (HOSPITAL_COMMUNITY): Payer: Medicare Other | Admitting: Occupational Therapy

## 2023-01-05 ENCOUNTER — Encounter (HOSPITAL_COMMUNITY): Payer: Medicare Other | Admitting: Occupational Therapy

## 2023-03-31 ENCOUNTER — Inpatient Hospital Stay (HOSPITAL_BASED_OUTPATIENT_CLINIC_OR_DEPARTMENT_OTHER): Payer: Medicare Other

## 2023-03-31 ENCOUNTER — Emergency Department (HOSPITAL_COMMUNITY): Payer: Medicare Other

## 2023-03-31 ENCOUNTER — Inpatient Hospital Stay (HOSPITAL_COMMUNITY): Payer: Medicare Other

## 2023-03-31 ENCOUNTER — Other Ambulatory Visit (HOSPITAL_COMMUNITY): Payer: Self-pay | Admitting: *Deleted

## 2023-03-31 ENCOUNTER — Observation Stay (HOSPITAL_COMMUNITY)
Admission: EM | Admit: 2023-03-31 | Discharge: 2023-04-01 | Disposition: A | Discharge status: Home / Self Care | Payer: Medicare Other | Attending: Internal Medicine | Admitting: Internal Medicine

## 2023-03-31 ENCOUNTER — Other Ambulatory Visit: Payer: Self-pay

## 2023-03-31 ENCOUNTER — Encounter (HOSPITAL_COMMUNITY): Payer: Self-pay | Admitting: Emergency Medicine

## 2023-03-31 DIAGNOSIS — I10 Essential (primary) hypertension: Secondary | ICD-10-CM | POA: Diagnosis not present

## 2023-03-31 DIAGNOSIS — Z79899 Other long term (current) drug therapy: Secondary | ICD-10-CM | POA: Insufficient documentation

## 2023-03-31 DIAGNOSIS — R200 Anesthesia of skin: Secondary | ICD-10-CM | POA: Diagnosis present

## 2023-03-31 DIAGNOSIS — I6502 Occlusion and stenosis of left vertebral artery: Secondary | ICD-10-CM | POA: Insufficient documentation

## 2023-03-31 DIAGNOSIS — I6522 Occlusion and stenosis of left carotid artery: Secondary | ICD-10-CM | POA: Diagnosis not present

## 2023-03-31 DIAGNOSIS — I639 Cerebral infarction, unspecified: Principal | ICD-10-CM | POA: Diagnosis present

## 2023-03-31 DIAGNOSIS — K2270 Barrett's esophagus without dysplasia: Secondary | ICD-10-CM | POA: Diagnosis present

## 2023-03-31 DIAGNOSIS — K2100 Gastro-esophageal reflux disease with esophagitis, without bleeding: Secondary | ICD-10-CM

## 2023-03-31 DIAGNOSIS — Z6839 Body mass index (BMI) 39.0-39.9, adult: Secondary | ICD-10-CM | POA: Diagnosis not present

## 2023-03-31 DIAGNOSIS — E669 Obesity, unspecified: Secondary | ICD-10-CM | POA: Insufficient documentation

## 2023-03-31 DIAGNOSIS — E782 Mixed hyperlipidemia: Secondary | ICD-10-CM | POA: Diagnosis present

## 2023-03-31 DIAGNOSIS — I350 Nonrheumatic aortic (valve) stenosis: Secondary | ICD-10-CM | POA: Diagnosis not present

## 2023-03-31 DIAGNOSIS — I2510 Atherosclerotic heart disease of native coronary artery without angina pectoris: Secondary | ICD-10-CM | POA: Insufficient documentation

## 2023-03-31 DIAGNOSIS — R1319 Other dysphagia: Secondary | ICD-10-CM

## 2023-03-31 DIAGNOSIS — I6389 Other cerebral infarction: Secondary | ICD-10-CM

## 2023-03-31 DIAGNOSIS — I708 Atherosclerosis of other arteries: Secondary | ICD-10-CM | POA: Diagnosis present

## 2023-03-31 DIAGNOSIS — G8929 Other chronic pain: Secondary | ICD-10-CM | POA: Diagnosis present

## 2023-03-31 DIAGNOSIS — R531 Weakness: Secondary | ICD-10-CM

## 2023-03-31 DIAGNOSIS — I4510 Unspecified right bundle-branch block: Secondary | ICD-10-CM | POA: Insufficient documentation

## 2023-03-31 DIAGNOSIS — R202 Paresthesia of skin: Secondary | ICD-10-CM | POA: Diagnosis not present

## 2023-03-31 DIAGNOSIS — F17210 Nicotine dependence, cigarettes, uncomplicated: Secondary | ICD-10-CM | POA: Insufficient documentation

## 2023-03-31 DIAGNOSIS — K209 Esophagitis, unspecified without bleeding: Secondary | ICD-10-CM | POA: Diagnosis present

## 2023-03-31 DIAGNOSIS — J449 Chronic obstructive pulmonary disease, unspecified: Secondary | ICD-10-CM | POA: Diagnosis not present

## 2023-03-31 DIAGNOSIS — I69351 Hemiplegia and hemiparesis following cerebral infarction affecting right dominant side: Secondary | ICD-10-CM | POA: Diagnosis not present

## 2023-03-31 DIAGNOSIS — K219 Gastro-esophageal reflux disease without esophagitis: Secondary | ICD-10-CM | POA: Diagnosis present

## 2023-03-31 DIAGNOSIS — Z7982 Long term (current) use of aspirin: Secondary | ICD-10-CM | POA: Insufficient documentation

## 2023-03-31 DIAGNOSIS — E876 Hypokalemia: Secondary | ICD-10-CM | POA: Diagnosis present

## 2023-03-31 DIAGNOSIS — E66812 Obesity, class 2: Secondary | ICD-10-CM | POA: Diagnosis present

## 2023-03-31 LAB — DIFFERENTIAL
Abs Immature Granulocytes: 0.17 10*3/uL — ABNORMAL HIGH (ref 0.00–0.07)
Basophils Absolute: 0 10*3/uL (ref 0.0–0.1)
Basophils Relative: 0 %
Eosinophils Absolute: 0 10*3/uL (ref 0.0–0.5)
Eosinophils Relative: 0 %
Immature Granulocytes: 1 %
Lymphocytes Relative: 14 %
Lymphs Abs: 2 10*3/uL (ref 0.7–4.0)
Monocytes Absolute: 0.8 10*3/uL (ref 0.1–1.0)
Monocytes Relative: 5 %
Neutro Abs: 11.5 10*3/uL — ABNORMAL HIGH (ref 1.7–7.7)
Neutrophils Relative %: 80 %

## 2023-03-31 LAB — LIPID PANEL
Cholesterol: 197 mg/dL (ref 0–200)
HDL: 46 mg/dL (ref >40)
LDL Cholesterol: 129 mg/dL — ABNORMAL HIGH (ref 0–99)
Total CHOL/HDL Ratio: 4.3 RATIO
Triglycerides: 110 mg/dL (ref <150)
VLDL: 22 mg/dL (ref 0–40)

## 2023-03-31 LAB — CBC
HCT: 36.9 % (ref 36.0–46.0)
Hemoglobin: 12.7 g/dL (ref 12.0–15.0)
MCH: 29.3 pg (ref 26.0–34.0)
MCHC: 34.4 g/dL (ref 30.0–36.0)
MCV: 85.2 fL (ref 80.0–100.0)
Platelets: 460 10*3/uL — ABNORMAL HIGH (ref 150–400)
RBC: 4.33 MIL/uL (ref 3.87–5.11)
RDW: 14.3 % (ref 11.5–15.5)
WBC: 14.5 10*3/uL — ABNORMAL HIGH (ref 4.0–10.5)
nRBC: 0 % (ref 0.0–0.2)

## 2023-03-31 LAB — RAPID URINE DRUG SCREEN, HOSP PERFORMED
Amphetamines: NOT DETECTED
Barbiturates: NOT DETECTED
Benzodiazepines: NOT DETECTED
Cocaine: POSITIVE — AB
Opiates: NOT DETECTED
Tetrahydrocannabinol: POSITIVE — AB

## 2023-03-31 LAB — URINALYSIS, ROUTINE W REFLEX MICROSCOPIC
Bilirubin Urine: NEGATIVE
Glucose, UA: NEGATIVE mg/dL
Hgb urine dipstick: NEGATIVE
Ketones, ur: NEGATIVE mg/dL
Leukocytes,Ua: NEGATIVE
Nitrite: NEGATIVE
Protein, ur: NEGATIVE mg/dL
Specific Gravity, Urine: 1.034 — ABNORMAL HIGH (ref 1.005–1.030)
pH: 7 (ref 5.0–8.0)

## 2023-03-31 LAB — COMPREHENSIVE METABOLIC PANEL
ALT: 24 U/L (ref 0–44)
AST: 20 U/L (ref 15–41)
Albumin: 3.7 g/dL (ref 3.5–5.0)
Alkaline Phosphatase: 80 U/L (ref 38–126)
Anion gap: 12 (ref 5–15)
BUN: 9 mg/dL (ref 6–20)
CO2: 20 mmol/L — ABNORMAL LOW (ref 22–32)
Calcium: 8.6 mg/dL — ABNORMAL LOW (ref 8.9–10.3)
Chloride: 106 mmol/L (ref 98–111)
Creatinine, Ser: 0.9 mg/dL (ref 0.44–1.00)
GFR, Estimated: >60 mL/min (ref >60)
Glucose, Bld: 133 mg/dL — ABNORMAL HIGH (ref 70–99)
Potassium: 2.8 mmol/L — ABNORMAL LOW (ref 3.5–5.1)
Sodium: 138 mmol/L (ref 135–145)
Total Bilirubin: 0.4 mg/dL (ref 0.3–1.2)
Total Protein: 7.1 g/dL (ref 6.5–8.1)

## 2023-03-31 LAB — ECHOCARDIOGRAM COMPLETE
AR max vel: 1.46 cm2
AV Area VTI: 1.37 cm2
AV Area mean vel: 1.47 cm2
AV Mean grad: 9 mmHg
AV Peak grad: 19.4 mmHg
Ao pk vel: 2.2 m/s
Area-P 1/2: 2.8 cm2
Height: 67 in
S' Lateral: 2.8 cm
Weight: 3433.6 oz

## 2023-03-31 LAB — HEMOGLOBIN A1C
Hgb A1c MFr Bld: 6.1 % — ABNORMAL HIGH (ref 4.8–5.6)
Mean Plasma Glucose: 128.37 mg/dL

## 2023-03-31 LAB — PROTIME-INR
INR: 0.9 (ref 0.8–1.2)
Prothrombin Time: 12.2 seconds (ref 11.4–15.2)

## 2023-03-31 LAB — CBG MONITORING, ED: Glucose-Capillary: 126 mg/dL — ABNORMAL HIGH (ref 70–99)

## 2023-03-31 LAB — APTT: aPTT: 23 seconds — ABNORMAL LOW (ref 24–36)

## 2023-03-31 LAB — ETHANOL: Alcohol, Ethyl (B): <10 mg/dL (ref <10)

## 2023-03-31 MED ORDER — EZETIMIBE 10 MG PO TABS
10.0000 mg | ORAL_TABLET | Freq: Every day | ORAL | Status: DC
  Administered 2023-03-31 – 2023-04-01 (×2): 10 mg via ORAL
  Filled 2023-03-31 (×2): qty 1

## 2023-03-31 MED ORDER — TICAGRELOR 90 MG PO TABS
90.0000 mg | ORAL_TABLET | Freq: Once | ORAL | Status: AC
  Administered 2023-03-31: 90 mg via ORAL
  Filled 2023-03-31: qty 1

## 2023-03-31 MED ORDER — BUPRENORPHINE HCL-NALOXONE HCL 8-2 MG SL SUBL
1.0000 | SUBLINGUAL_TABLET | Freq: Three times a day (TID) | SUBLINGUAL | Status: DC
  Administered 2023-03-31 – 2023-04-01 (×4): 1 via SUBLINGUAL
  Filled 2023-03-31 (×4): qty 1

## 2023-03-31 MED ORDER — IOHEXOL 350 MG/ML SOLN
75.0000 mL | Freq: Once | INTRAVENOUS | Status: AC | PRN
  Administered 2023-03-31: 75 mL via INTRAVENOUS

## 2023-03-31 MED ORDER — POTASSIUM CHLORIDE CRYS ER 20 MEQ PO TBCR
40.0000 meq | EXTENDED_RELEASE_TABLET | Freq: Once | ORAL | Status: AC
  Administered 2023-03-31: 40 meq via ORAL
  Filled 2023-03-31: qty 2

## 2023-03-31 MED ORDER — ACETAMINOPHEN 650 MG RE SUPP: 650.0000 mg | Freq: Four times a day (QID) | RECTAL | Status: DC | PRN

## 2023-03-31 MED ORDER — ONDANSETRON HCL 4 MG PO TABS
4.0000 mg | ORAL_TABLET | Freq: Four times a day (QID) | ORAL | Status: DC | PRN
  Administered 2023-04-01: 4 mg via ORAL
  Filled 2023-03-31: qty 1

## 2023-03-31 MED ORDER — MAGNESIUM OXIDE -MG SUPPLEMENT 400 (240 MG) MG PO TABS
800.0000 mg | ORAL_TABLET | Freq: Once | ORAL | Status: AC
  Administered 2023-03-31: 800 mg via ORAL
  Filled 2023-03-31: qty 2

## 2023-03-31 MED ORDER — POLYETHYLENE GLYCOL 3350 17 G PO PACK
17.0000 g | PACK | Freq: Every day | ORAL | Status: DC | PRN
  Administered 2023-04-01: 17 g via ORAL
  Filled 2023-03-31: qty 1

## 2023-03-31 MED ORDER — SODIUM CHLORIDE 0.9 % IV SOLN: INTRAVENOUS | Status: AC

## 2023-03-31 MED ORDER — ONDANSETRON HCL 4 MG/2ML IJ SOLN: 4.0000 mg | Freq: Four times a day (QID) | INTRAMUSCULAR | Status: DC | PRN

## 2023-03-31 MED ORDER — ATORVASTATIN CALCIUM 40 MG PO TABS
80.0000 mg | ORAL_TABLET | Freq: Every day | ORAL | Status: DC
  Administered 2023-03-31: 80 mg via ORAL
  Filled 2023-03-31: qty 2

## 2023-03-31 MED ORDER — IBUPROFEN 400 MG PO TABS
400.0000 mg | ORAL_TABLET | Freq: Once | ORAL | Status: AC
  Administered 2023-04-01: 400 mg via ORAL
  Filled 2023-03-31: qty 1

## 2023-03-31 MED ORDER — ASPIRIN 325 MG PO TABS
325.0000 mg | ORAL_TABLET | Freq: Once | ORAL | Status: AC
  Administered 2023-03-31: 325 mg via ORAL
  Filled 2023-03-31: qty 1

## 2023-03-31 MED ORDER — ACETAMINOPHEN 325 MG PO TABS
650.0000 mg | ORAL_TABLET | Freq: Four times a day (QID) | ORAL | Status: DC | PRN
  Administered 2023-03-31 (×2): 650 mg via ORAL
  Filled 2023-03-31 (×2): qty 2

## 2023-03-31 MED ORDER — ASPIRIN 81 MG PO TBEC
81.0000 mg | DELAYED_RELEASE_TABLET | Freq: Every day | ORAL | Status: DC
  Administered 2023-04-01: 81 mg via ORAL
  Filled 2023-03-31: qty 1

## 2023-03-31 MED ORDER — PANTOPRAZOLE SODIUM 40 MG PO TBEC
40.0000 mg | DELAYED_RELEASE_TABLET | Freq: Every day | ORAL | Status: DC
  Administered 2023-03-31 – 2023-04-01 (×2): 40 mg via ORAL
  Filled 2023-03-31 (×2): qty 1

## 2023-03-31 MED ORDER — POTASSIUM CHLORIDE 10 MEQ/100ML IV SOLN
10.0000 meq | INTRAVENOUS | Status: AC
  Administered 2023-03-31 (×4): 10 meq via INTRAVENOUS
  Filled 2023-03-31 (×4): qty 100

## 2023-03-31 MED ORDER — TICAGRELOR 90 MG PO TABS
90.0000 mg | ORAL_TABLET | Freq: Two times a day (BID) | ORAL | Status: DC
  Administered 2023-03-31 – 2023-04-01 (×2): 90 mg via ORAL
  Filled 2023-03-31 (×2): qty 1

## 2023-03-31 NOTE — Consult Note (Signed)
I connected with  Rita FinesSonya W Salman on 03/31/23 by a video enabled telemedicine application and verified that I am speaking with the correct person using two identifiers.   I discussed the limitations of evaluation and management by telemedicine. The patient expressed understanding and agreed to proceed.  Location of patient: [facility] Location of physician[facility]  Neurology Consultation Reason for Consult: stroke Referring Physician: Dr Maurilio LovelyPratik Shah  CC: stroke  History is obtained from: Patient, chart review  HPI: Rita [name] is a 57 y.o. female with past medical history of hypertension, hyperlipidemia, nicotine use disorder, prior stroke with residual right-sided weakness, Status post endovascular treatment of atherosclerotic ulcerated stenotic lesion of the left internal carotid artery caval cavernous segment associated with a superior hypophyseal region aneurysm with placement of one 4.25 mm x 16 mm pipeline Shield flow diverter device in September 2023, was on aspirin and Brilinta who presented with sudden onset left arm paresthesias.  Patient states she went to bed around 2 AM on 03/31/2023.  She woke up around 4:30 AM and noticed pain in her left hand as well as numbness and heaviness in the left arm medially.  She also contact her speech was stuttering.  Denied any aphasia, facial weakness or numbness, weakness or numbness in legs, visual changes.  Denies any similar symptoms in the past.  States she was taking aspirin and Brilinta but currently in stopping so she has not been taking medications for quite a while now.   Last known normal: 03/31/2023 around 2 AM Event happened at home No tPA at outside window No thrombectomy as low suspicion for LVO, low NIHSS mRS 1 (has some right-sided weakness but does not need cane/stroke, lives independently)  ROS: All other systems reviewed and negative except as noted in the HPI.   Past Medical History:  Diagnosis Date    Arthritis    "qwhere" (02/22/2018)   Barrett's esophagus with esophagitis 03/26/2013   Chronic abdominal pain    Chronic lower back pain    Chronic pancreatitis    COPD (chronic obstructive pulmonary disease)    Depression    Dyspnea    GERD (gastroesophageal reflux disease)    Heart murmur    Hiatal hernia    High cholesterol    Hypertension    Peptic ulcer    Pneumonia 2000s X 1   Stroke    Tobacco use 03/26/2013    Family History  Problem Relation Age of Onset   Asthma Mother    Heart failure Mother    Cancer Mother        pancreatic   Diabetes Mother    Hypertension Mother    Stroke Mother    Pancreatic cancer Mother        deceased   Heart failure Father    Diabetes Father    Colon cancer Neg Hx     Social History:  reports that she has been smoking cigarettes. She started smoking about 40 years ago. She has a 20.50 pack-year smoking history. She has never used smokeless tobacco. She reports that she does not drink alcohol and does not use drugs.  Exam: Current vital signs: BP (!) 121/59   Pulse 63   Temp 97.9 F (36.6 C) (Oral)   Resp 15   Ht 5\' 7"  (1.702 m)   Wt 97.3 kg   LMP 02/09/2012   SpO2 99%   BMI 33.61 kg/m  Vital signs in last 24 hours: Temp:  [97.9  F (36.6 C)] 97.9 F (36.6 C) (04/05 5670) Pulse Rate:  [56-71] 63 (04/05 0930) Resp:  [12-17] 15 (04/05 0930) BP: (121-140)/(59-116) 121/59 (04/05 0930) SpO2:  [98 %-100 %] 99 % (04/05 0930) Weight:  [97.3 kg] 97.3 kg (04/05 0658)   Physical Exam  Constitutional: Appears well-developed and well-nourished.  Psych: Affect appropriate to situation Neuro: AOx3, no aphasia, cranial nerves II to XII appear grossly intact, antigravity strength in all 4 extremities without drift, FTN intact bilaterally, decree sensation in left ulnar division (medial side of left forearm as well as fourth and fifth finger)  NIHSS 1  INPUTS: 1A: Level of consciousness --> 0 = Alert; keenly responsive 1B: Ask  month and age --> 0 = Both questions right 1C: 'Blink eyes' & 'squeeze hands' --> 0 = Performs both tasks 2: Horizontal extraocular movements --> 0 = Normal 3: Visual fields --> 0 = No visual loss 4: Facial palsy --> 0 = Normal symmetry 5A: Left arm motor drift --> 0 = No drift for 10 seconds 5B: Right arm motor drift --> 0 = No drift for 10 seconds 6A: Left leg motor drift --> 0 = No drift for 5 seconds 6B: Right leg motor drift --> 0 = No drift for 5 seconds 7: Limb Ataxia --> 0 = No ataxia 8: Sensation --> 1 = Mild-moderate loss: less sharp/more dull  9: Language/aphasia --> 0 = Normal; no aphasia 10: Dysarthria --> 0 = Normal 11: Extinction/inattention --> 0 = No abnormality   I have reviewed labs in epic and the results pertinent to this consultation are: CBC:  Recent Labs  Lab 03/31/23 0640  WBC 14.5*  NEUTROABS 11.5*  HGB 12.7  HCT 36.9  MCV 85.2  PLT 460*    Basic Metabolic Panel:  Lab Results  Component Value Date   NA 138 03/31/2023   K 2.8 (L) 03/31/2023   CO2 20 (L) 03/31/2023   GLUCOSE 133 (H) 03/31/2023   BUN 9 03/31/2023   CREATININE 0.90 03/31/2023   CALCIUM 8.6 (L) 03/31/2023   GFRNONAA >60 03/31/2023   GFRAA >60 01/24/2019   Lipid Panel:  Lab Results  Component Value Date   LDLCALC 129 (H) 03/31/2023   HgbA1c:  Lab Results  Component Value Date   HGBA1C 6.2 (H) 09/02/2022   Urine Drug Screen:     Component Value Date/Time   LABOPIA POSITIVE (A) 01/23/2019 0659   COCAINSCRNUR NONE DETECTED 01/23/2019 0659   LABBENZ NONE DETECTED 01/23/2019 0659   AMPHETMU NONE DETECTED 01/23/2019 0659   THCU POSITIVE (A) 01/23/2019 0659   LABBARB NONE DETECTED 01/23/2019 0659    Alcohol Level     Component Value Date/Time   ETH <10 03/31/2023 0640     I have reviewed the images obtained:  CT Head without contrast 03/31/2023: No acute cortically based infarct or acute intracranial hemorrhage identified. ASPECTS 10. Stable CT appearance of advanced  chronic ischemic disease in the left hemisphere and bilateral cerebellum since last year.    MRI Brain without contrast 03/31/2023: No acute intracranial abnormality. Chronic infarcts in the left frontoparietal region, left occipital lobe, and bilateral cerebellar hemispheres.  ASSESSMENT/PLAN: 57 year old female with multiple medical comorbidities as noted above who presented with left forearm paresthesias.  Paresthesias of left forearm, acute onset -Differentials include MRI negative stroke versus left ulnar neuropathy (exam limited via tele)  Recommendations: -Recommend CTA head and neck as patient has significant vascular disease -If TTE does not show any thrombus, recommend 30-day event monitor -  Recommend aspirin 81 mg daily and atorvastatin 80 mg daily for secondary stroke prevention - Please check with insurance if Marden NobleBrilinta is approved. If approved, can continue Brilinta 90mg  Bid  till neuro follow up. If not approved start plavix 1823m daily instead till neuro follow up -Goal blood pressure systolics 120s to 140.  Avoid hypotension as patient has significant stenosis in all intracranial vessels -Will schedule for follow-up with neurology asap (order placed) -Modification of stroke risk factors   Thank you for allowing us to participate in the care of this patient. If you have any further questions, please contact  me or neurohospitalist.    Lindie SprucePriyanka Kelechi [facility] neurohospitalist

## 2023-03-31 NOTE — ED Notes (Signed)
Patient transported to MRI 

## 2023-03-31 NOTE — ED Provider Notes (Signed)
[facility] Provider Note   CSN: [id number] Arrival date & time: 03/31/23  [id number]  An emergency department physician performed an initial assessment on this suspected stroke patient at 0636.  History  Chief Complaint  Patient presents with   Code Stroke    Rita [name] is a 57 y.o. female.  Patient presents to the emergency department for evaluation of stroke symptoms.  Patient reports that she went to bed at 2 AM last night.  She woke up at 530 this morning and felt numb and weak in her left arm.  Patient also reports that she is having trouble thinking and speaking, cannot find words.       Home Medications Prior to Admission medications   Medication Sig Start Date End Date Taking? Authorizing Provider  aspirin EC 81 MG tablet Take 1 tablet (81 mg total) by mouth daily. Swallow whole. 09/10/22   Meredeth IdeLama, Gagan S, MD  atorvastatin (LIPITOR) 80 MG tablet Take 1 tablet (80 mg total) by mouth daily at 6 PM. 01/24/19   Danford, Earl Liteshristopher P, MD  Buprenorphine HCl-Naloxone HCl 8-2 MG FILM Take 1 Film by mouth 3 (three) times daily. 12/14/18   [provider]  colchicine 0.6 MG tablet Take 1 tablet (0.6 mg total) by mouth 2 (two) times daily for 7 days. Patient not taking: Reported on 09/01/2022 09/20/19 09/27/19  Claude MangesSoto, Johana, PA-C  DEXILANT 60 MG capsule TAKE 1 CAPSULE(60 MG) BY MOUTH AT BEDTIME Patient taking differently: Take 60 mg by mouth daily. 02/19/19   Gelene MinkBoone, Anna W, NP  ezetimibe (ZETIA) 10 MG tablet Take 1 tablet (10 mg total) by mouth daily. 09/10/22   Meredeth IdeLama, Gagan S, MD  potassium chloride (MICRO-K) 10 MEQ CR capsule Take 10 mEq by mouth 2 (two) times daily. 05/26/22   [provider]  ticagrelor (BRILINTA) 90 MG TABS tablet Take 1 tablet (90 mg total) by mouth 2 (two) times daily. 09/09/22   Meredeth IdeLama, Gagan S, MD      Allergies    Patient has no known allergies.    Review of Systems   Review of Systems  Physical  Exam Updated Vital Signs BP (!) 135/116 (BP Location: Left Arm)   Pulse 64   Temp 97.9 F (36.6 C) (Oral)   Resp 17   Ht 5\' 7"  (1.702 m)   Wt 97.3 kg   LMP 02/09/2012   SpO2 98%   BMI 33.61 kg/m  Physical Exam Vitals and nursing note reviewed.  Constitutional:      General: She is not in acute distress.    Appearance: She is well-developed.  HENT:     Head: Normocephalic and atraumatic.     Mouth/Throat:     Mouth: Mucous membranes are moist.  Eyes:     General: Vision grossly intact. Gaze aligned appropriately.     Extraocular Movements: Extraocular movements intact.     Conjunctiva/sclera: Conjunctivae normal.  Cardiovascular:     Rate and Rhythm: Normal rate and regular rhythm.     Pulses: Normal pulses.     Heart sounds: Normal heart sounds, S1 normal and S2 normal. No murmur heard.    No friction rub. No gallop.  Pulmonary:     Effort: Pulmonary effort is normal. No respiratory distress.     Breath sounds: Normal breath sounds.  Abdominal:     General: Bowel sounds are normal.     Palpations: Abdomen is soft.     Tenderness: There  is no abdominal tenderness. There is no guarding or rebound.     Hernia: No hernia is present.  Musculoskeletal:        General: No swelling.     Cervical back: Full passive range of motion without pain, normal range of motion and neck supple. No spinous process tenderness or muscular tenderness. Normal range of motion.     Right lower leg: No edema.     Left lower leg: No edema.  Skin:    General: Skin is warm and dry.     Capillary Refill: Capillary refill takes less than 2 seconds.     Findings: No ecchymosis, erythema, rash or wound.  Neurological:     General: No focal deficit present.     Mental Status: She is alert and oriented to person, place, and time.     GCS: GCS eye subscore is 4. GCS verbal subscore is 5. GCS motor subscore is 6.     Cranial Nerves: Cranial nerves 2-12 are intact.     Motor: Weakness present.      Comments: Facial asymmetry, no visual field cut  Patient intermittently finding it difficult to talk, not getting out words and then intermittently stuttering  Left upper extremity weakness and pronator drift present  Psychiatric:        Attention and Perception: Attention normal.        Mood and Affect: Mood normal.        Speech: Speech normal.        Behavior: Behavior normal.     ED Results / Procedures / Treatments   Labs (all labs ordered are listed, but only abnormal results are displayed) Labs Reviewed  APTT - Abnormal; Notable for the following components:      Result Value   aPTT 23 (*)    All other components within normal limits  CBC - Abnormal; Notable for the following components:   WBC 14.5 (*)    Platelets 460 (*)    All other components within normal limits  DIFFERENTIAL - Abnormal; Notable for the following components:   Neutro Abs 11.5 (*)    Abs Immature Granulocytes 0.17 (*)    All other components within normal limits  COMPREHENSIVE METABOLIC PANEL - Abnormal; Notable for the following components:   Potassium 2.8 (*)    CO2 20 (*)    Glucose, Bld 133 (*)    Calcium 8.6 (*)    All other components within normal limits  CBG MONITORING, ED - Abnormal; Notable for the following components:   Glucose-Capillary 126 (*)    All other components within normal limits  ETHANOL  PROTIME-INR  RAPID URINE DRUG SCREEN, HOSP PERFORMED  URINALYSIS, ROUTINE W REFLEX MICROSCOPIC  I-STAT CHEM 8, ED    EKG EKG Interpretation  Date/Time:  Friday March 31 2023 07:01:40 EDT Ventricular Rate:  69 PR Interval:  167 QRS Duration: 163 QT Interval:  467 QTC Calculation: 501 R Axis:   49 Text Interpretation: Sinus rhythm Right bundle branch block No significant change since last tracing Confirmed by Gilda Crease [phone]) on 03/31/2023 7:03:12 AM  Radiology CT HEAD CODE STROKE WO CONTRAST  Addendum Date: 03/31/2023   ADDENDUM REPORT: 03/31/2023 07:08 ADDENDUM:  Study discussed by telephone with Dr. Jaci Carrel on 03/31/2023 at 0656 hours. Electronically Signed   By: Odessa Fleming M.D.   On: 03/31/2023 07:08   Result Date: 03/31/2023 CLINICAL DATA:  Code stroke. 57 year old female with sudden onset slurred speech, left upper extremity  numbness. Woke with the symptoms at 0530 hours. EXAM: CT HEAD WITHOUT CONTRAST TECHNIQUE: Contiguous axial images were obtained from the base of the skull through the vertex without intravenous contrast. RADIATION DOSE REDUCTION: This exam was performed according to the departmental dose-optimization program which includes automated exposure control, adjustment of the mA and/or kV according to patient size and/or use of iterative reconstruction technique. COMPARISON:  Brain MRI 09/01/2022, head CT 09/02/2022. FINDINGS: Brain: Chronic post ischemic encephalomalacia in the left hemisphere affecting both the left MCA and PCA territories does not appear significantly changed since September. Chronic right greater than left cerebellar infarcts and encephalomalacia is stable. Patchy additional bilateral white matter hypodensity including at the right frontal operculum does not appear significantly changed. No superimposed No midline shift, ventriculomegaly, mass effect, evidence of mass lesion, intracranial hemorrhage or evidence of cortically based acute infarction. Vascular: Left ICA siphon vascular stent with superimpose calcified atherosclerosis at the skull base. No suspicious intracranial vascular hyperdensity. Skull: No acute osseous abnormality identified. Sinuses/Orbits: Visualized paranasal sinuses and mastoids are stable and well aerated. Other: No gaze deviation. No acute orbit or scalp soft tissue finding. ASPECTS [facility] Stroke Program Early CT Score) Total score (0-10 with 10 being normal): 10 (chronic encephalomalacia). IMPRESSION: 1. No acute cortically based infarct or acute intracranial hemorrhage identified. ASPECTS 10. 2.  Stable CT appearance of advanced chronic ischemic disease in the left hemisphere and bilateral cerebellum since last year. Electronically Signed: By: Odessa Fleming M.D. On: 03/31/2023 06:55    Procedures Procedures    Medications Ordered in ED Medications  potassium chloride SA (KLOR-CON M) CR tablet 40 mEq (has no administration in time range)  magnesium oxide (MAG-OX) tablet 800 mg (has no administration in time range)    ED Course/ Medical Decision Making/ A&P                             Medical Decision Making Amount and/or Complexity of Data Reviewed Labs: ordered. Radiology: ordered.  Risk OTC drugs. Prescription drug management.   Presented with speech difficulty and left arm numbness and weakness.  Differential diagnosis considered includes, but not limited to: TIA; Stroke; ICH; Seizure;  Last known normal 2 AM (4.5 hrs PTA)  Patient's records were reviewed.  She has had multiple strokes in the past.  Current presentation reveals weakness of the left upper extremity, extensor muscles more than flexor muscles, distal more than proximal.  She does not have a facial droop, does have stuttering speech at times but no classic aphasia.  Sent to radiology for CT head and code stroke initiated.  CT head shows multiple prior strokes but no acute findings to explain her current symptoms.  Patient reports that she does not have any of her medications, has been off all of her medications for about a month.  Records indicate that she is supposed to be on aspirin, Brilinta, Lipitor, Dexilant, Zetia.  Aspirin and Brilinta were recommended by interventional radiology when she was found to have left vertebral artery occlusion and left subclavian artery occlusion.  Further record evaluation reveals that she has had a prior left carotid to subclavian bypass in 2019.  She also has had a left internal carotid artery aneurysm embolization by Dr. Corliss Skains, IR, and September 2023.  Case signed  out to oncoming ER physician with neurology evaluation and recommendations pending.        Final Clinical Impression(s) / ED Diagnoses Final diagnoses:  Left-sided weakness  Rx / DC Orders ED Discharge Orders     None         Roney Youtz, Canary Brimhristopher J, MD 03/31/23 [phone]

## 2023-03-31 NOTE — ED Triage Notes (Signed)
Pt BIB [facility] from home c/o sudden slurred speech and numbness/tingling to left forearm. Pt states she went to bed around 2am and woke up with these symptoms at 0530.  Hx of CVA x2 and has been without all her medications for the past month (including her Brilinta).

## 2023-03-31 NOTE — Evaluation (Signed)
Physical Therapy Evaluation Patient Details Name: Rita [name] MRN: [id number] DOB: 02/08/1966 Today's Date: 03/31/2023  History of Present Illness  Rita [name] is a 57 y.o. female with medical history significant for prior CVAs, Barrett's esophagus, chronic back pain, chronic abdominal pain, COPD, hypertension, and dyslipidemia who presented to the ED with difficulty in speech as well as left arm numbness and heaviness that began at approximately 5:30 AM.  She states that she went to bed at approximately 8 PM last night and then woke up again at 2 AM at which point she claimed to not have any symptoms.  She denies any headaches, dizziness, or vision changes.  She states that she has been off of all of her medications over the last 1 month given issues with her insurance.   Clinical Impression  Patient functioning near baseline for functional mobility and gait other than c/o of new weakness and numbness in LUE and mild numbness in LLE, otherwise demonstrates good return for bed mobility, transfers and ambulating in hallways without loss of balance.  Plan:  Patient discharged from physical therapy to care of nursing for ambulation daily as tolerated for length of stay.         Recommendations for follow up therapy are one component of a multi-disciplinary discharge planning process, led by the attending physician.  Recommendations may be updated based on patient status, additional functional criteria and insurance authorization.  Follow Up Recommendations       Assistance Recommended at Discharge PRN  Patient can return home with the following  Other (comment) (near baseline)    Equipment Recommendations None recommended by PT  Recommendations for Other Services       Functional Status Assessment Patient has had a recent decline in their functional status and/or demonstrates limited ability to make significant improvements in function in a reasonable and predictable amount of time      Precautions / Restrictions Precautions Precautions: Fall Restrictions Weight Bearing Restrictions: No      Mobility  Bed Mobility Overal bed mobility: Independent                  Transfers Overall transfer level: Independent                      Ambulation/Gait Ambulation/Gait assistance: Independent, Modified independent (Device/Increase time) Gait Distance (Feet): 120 Feet Assistive device: None Gait Pattern/deviations: WFL(Within Functional Limits) Gait velocity: near normal     General Gait Details: grossly WFL with good return for ambulation in room, hallways without loss of balance  Stairs            Wheelchair Mobility    Modified Rankin (Stroke Patients Only)       Balance Overall balance assessment: Independent                                           Pertinent Vitals/Pain Pain Assessment Pain Assessment: No/denies pain    Home Living Family/patient expects to be discharged to:: Private residence Living Arrangements: Spouse/significant other Available Help at Discharge: Family;Available PRN/intermittently Type of Home: House Home Access: Stairs to enter Entrance Stairs-Rails: Right Entrance Stairs-Number of Steps: 1   Home Layout: One level Home Equipment: Cane - single point      Prior Function Prior Level of Function : Independent/Modified Independent  Mobility Comments: Short distance community ambulator without AD. ADLs Comments: Independent. Pt drives.     Hand Dominance   Dominant Hand: Right    Extremity/Trunk Assessment   Upper Extremity Assessment Upper Extremity Assessment: Defer to OT evaluation LUE Deficits / Details: 4/5 grossly; decreased gross motor coodination and sensation elbow to digits. LUE Sensation: decreased light touch LUE Coordination: decreased gross motor    Lower Extremity Assessment Lower Extremity Assessment: Overall WFL for tasks assessed     Cervical / Trunk Assessment Cervical / Trunk Assessment: Normal  Communication   Communication: No difficulties  Cognition Arousal/Alertness: Awake/alert Behavior During Therapy: WFL for tasks assessed/performed Overall Cognitive Status: Within Functional Limits for tasks assessed                                          General Comments      Exercises     Assessment/Plan    PT Assessment Patient does not need any further PT services  PT Problem List         PT Treatment Interventions      PT Goals (Current goals can be found in the Care Plan section)  Acute Rehab PT Goals Patient Stated Goal: return home PT Goal Formulation: With patient Time For Goal Achievement: 03/31/23 Potential to Achieve Goals: Good    Frequency       Co-evaluation PT/OT/SLP Co-Evaluation/Treatment: Yes Reason for Co-Treatment: To address functional/ADL transfers PT goals addressed during session: Mobility/safety with mobility;Balance OT goals addressed during session: ADL's and self-care       AM-PAC PT "6 Clicks" Mobility  Outcome Measure Help needed turning from your back to your side while in a flat bed without using bedrails?: None Help needed moving from lying on your back to sitting on the side of a flat bed without using bedrails?: None Help needed moving to and from a bed to a chair (including a wheelchair)?: None Help needed standing up from a chair using your arms (e.g., wheelchair or bedside chair)?: None Help needed to walk in hospital room?: None Help needed climbing 3-5 steps with a railing? : None 6 Click Score: 24    End of Session   Activity Tolerance: Patient tolerated treatment well Patient left: in bed;with call bell/phone within reach Nurse Communication: Mobility status PT Visit Diagnosis: Unsteadiness on feet (R26.81);Other abnormalities of gait and mobility (R26.89);Muscle weakness (generalized) (M62.81)    Time: 0867-6195 PT Time  Calculation (min) (ACUTE ONLY): 20 min   Charges:   PT Evaluation $PT Eval Moderate Complexity: 1 Mod PT Treatments $Therapeutic Activity: 8-22 mins        11:35 AM, 03/31/23 Ocie Bob, MPT Physical Therapist with [facility] [phone] office [phone] mobile phone

## 2023-03-31 NOTE — Evaluation (Signed)
Occupational Therapy Evaluation Patient Details Name: Rita [name] MRN: [id number] DOB: 04/19/1966 Today's Date: 03/31/2023   History of Present Illness Rita [name] is a 57 y.o. female with medical history significant for prior CVAs, Barrett's esophagus, chronic back pain, chronic abdominal pain, COPD, hypertension, and dyslipidemia who presented to the ED with difficulty in speech as well as left arm numbness and heaviness that began at approximately 5:30 AM.  She states that she went to bed at approximately 8 PM last night and then woke up again at 2 AM at which point she claimed to not have any symptoms.  She denies any headaches, dizziness, or vision changes.  She states that she has been off of all of her medications over the last 1 month given issues with her insurance.   Clinical Impression   Pt agreeable to OT and PT co-evaluation. Pt primary deficit appears to be weakness, decreased coordination, and decreased sensation in L UE. Pt ambulated well and can complete ADL's without assist. Pt is weak in L UE but can still use it functionally. Pt will benefit from continued OT in the hospital and recommended venue below to increase strength, balance, and endurance for safe ADL's.         Recommendations for follow up therapy are one component of a multi-disciplinary discharge planning process, led by the attending physician.  Recommendations may be updated based on patient status, additional functional criteria and insurance authorization.   Assistance Recommended at Discharge PRN        Functional Status Assessment  Patient has had a recent decline in their functional status and demonstrates the ability to make significant improvements in function in a reasonable and predictable amount of time.  Equipment Recommendations  None recommended by OT    Recommendations for Other Services       Precautions / Restrictions Precautions Precautions: Fall Restrictions Weight Bearing  Restrictions: No      Mobility Bed Mobility Overal bed mobility: Independent                  Transfers Overall transfer level: Independent                        Balance Overall balance assessment: Independent                                         ADL either performed or assessed with clinical judgement   ADL Overall ADL's : Independent                                             Vision Baseline Vision/History:  (Peripheral vision deficit at baseline from previous strokes.) Ability to See in Adequate Light: 2 Moderately impaired Patient Visual Report: No change from baseline Vision Assessment?:  (Same as baseline presentation.)                Pertinent Vitals/Pain Pain Assessment Pain Assessment: No/denies pain     Hand Dominance Right   Extremity/Trunk Assessment Upper Extremity Assessment Upper Extremity Assessment: LUE deficits/detail LUE Deficits / Details: 4/5 grossly; decreased gross motor coodination and sensation elbow to digits. LUE Sensation: decreased light touch LUE Coordination: decreased gross motor   Lower Extremity Assessment Lower Extremity Assessment: Defer  to PT evaluation   Cervical / Trunk Assessment Cervical / Trunk Assessment: Normal   Communication Communication Communication: No difficulties   Cognition Arousal/Alertness: Awake/alert Behavior During Therapy: WFL for tasks assessed/performed Overall Cognitive Status: Within Functional Limits for tasks assessed                                                        Home Living Family/patient expects to be discharged to:: Private residence Living Arrangements: Spouse/significant other Available Help at Discharge: Family;Available PRN/intermittently Type of Home: House Home Access: Stairs to enter [employer] of Steps: 1   Home Layout: One level     Bathroom Shower/Tub: Multimedia programmer: Standard     Home Equipment: Cane - single point          Prior Functioning/Environment Prior Level of Function : Independent/Modified Independent             Mobility Comments: Short distance community ambulator without AD. ADLs Comments: Independent. Pt drives.        OT Problem List: Decreased activity tolerance;Impaired sensation;Decreased strength      OT Treatment/Interventions: Self-care/ADL training;Therapeutic activities;Patient/family education;Neuromuscular education;Therapeutic exercise    OT Goals(Current goals can be found in the care plan section) Acute Rehab OT Goals Patient Stated Goal: return home OT Goal Formulation: With patient Time For Goal Achievement: 04/14/23 Potential to Achieve Goals: Good  OT Frequency: Min 1X/week    Co-evaluation PT/OT/SLP Co-Evaluation/Treatment: Yes Reason for Co-Treatment: To address functional/ADL transfers   OT goals addressed during session: ADL's and self-care                       End of Session    Activity Tolerance: Patient tolerated treatment well Patient left: in bed;with call bell/phone within reach  OT Visit Diagnosis: Muscle weakness (generalized) (M62.81);Hemiplegia and hemiparesis;Other symptoms and signs involving the nervous system (R29.898) Hemiplegia - Right/Left: Left Hemiplegia - dominant/non-dominant: Non-Dominant                Time: 3354-5625 OT Time Calculation (min): 11 min Charges:  OT General Charges $OT Visit: 1 Visit OT Evaluation $OT Eval Low Complexity: 1 Low  Rawleigh Rode OT, MOT  Danie Chandler 03/31/2023, 10:20 AM

## 2023-03-31 NOTE — Progress Notes (Signed)
Patient was not in her room. I came out in the hallway and security is walking with patient back to her room. Patient stated, " I just went down to the vending machine is to get me a drink." I explained to the patient that if she needs something she can just call me and I will get it for her, she is not allowed to go off the floor for security reasons. Patient is apologetic and stated, "I didn't know." Plan of care ongoing.

## 2023-03-31 NOTE — ED Provider Notes (Signed)
  Physical Exam  BP (!) 135/116 (BP Location: Left Arm)   Pulse 64   Temp 97.9 F (36.6 C) (Oral)   Resp 17   Ht 5\' 7"  (1.702 m)   Wt 97.3 kg   LMP 02/09/2012   SpO2 98%   BMI 33.61 kg/m   Physical Exam Vitals and nursing note reviewed.  Constitutional:      General: She is not in acute distress.    Appearance: She is well-developed.  HENT:     Head: Normocephalic and atraumatic.  Eyes:     Conjunctiva/sclera: Conjunctivae normal.  Cardiovascular:     Rate and Rhythm: Normal rate and regular rhythm.     Heart sounds: No murmur heard. Pulmonary:     Effort: Pulmonary effort is normal. No respiratory distress.     Breath sounds: Normal breath sounds.  Abdominal:     Palpations: Abdomen is soft.     Tenderness: There is no abdominal tenderness.  Musculoskeletal:        General: No swelling.     Cervical back: Neck supple.  Skin:    General: Skin is warm and dry.     Capillary Refill: Capillary refill takes less than 2 seconds.  Neurological:     Mental Status: She is alert.     Motor: Weakness present.  Psychiatric:        Mood and Affect: Mood normal.     Procedures  Procedures  ED Course / MDM    Medical Decision Making Amount and/or Complexity of Data Reviewed Labs: ordered. Radiology: ordered.  Risk OTC drugs. Prescription drug management. Decision regarding hospitalization.   Patient received an handoff.  Left-sided weakness and noncompliance with aspirin Brilinta.  Initial head CT normal pending teleneurology evaluation at time of signout.  Spoke with teleneurologist who is concern for underlying stroke and like the patient admitted for MRI and completion of stroke workup.  MRI ordered and patient admitted.       Glendora Score, MD 03/31/23 [phone]

## 2023-03-31 NOTE — Consult Note (Signed)
Code stroke activated at 0636.  Left for CT at 0641.  Paged telespecialist neurology at [phone].   Dr Graciela Husbands on camera for neuro exam at [phone]. Report and NCCT results given at this time.

## 2023-03-31 NOTE — ED Notes (Signed)
Patient ambulates with steady gait to bathroom. Urine cup provided for ordered specimen.

## 2023-03-31 NOTE — ED Notes (Signed)
Patient returns from MRI, IVF and K+ restarted.

## 2023-03-31 NOTE — Consult Note (Signed)
[facility] Consult [facility]   Patient Name:   Rita [name], Rita [name] Date of Birth:   [birth year] Identification Number:   MRN - [id number] Date of Service:   03/31/2023 06:49:50  Diagnosis:       I63.89 - Cerebrovascular accident (CVA) due to other mechanism [facility])  Impression:      57 y/o F, history of HTN, HLD, prior left hemisphere ischemic strokes with residual right arm weakness and restricted peripheral vision, known cerebrovascular disease, chronic tobacco use, presenting with left arm numbness and weakness upon waking up this morning, LKN 2 AM. NIHSS is 5 (drift of LUE/LLE, reduced sensation throughout the left, didn't know month, restricted visual fields). NCHCT with chronic ischemia, no acute abnormalities. She is not within 4.5 hour thrombolytic time window. NIHSS < 6 without any new cortical symptoms/findings on examination, thus low clinical suspicion for LVO, CTA not pursued.    Highest clinical suspicion is for recurrent ischemic stroke, more than likely in setting of uncontrolled vascular risk factors. Recommend admission for further evaluation.  Our recommendations are outlined below.  Recommendations:        Stroke/Telemetry Floor       Neuro Checks       Bedside Swallow Eval       DVT Prophylaxis       IV Fluids, Normal Saline       Head of Bed 30 Degrees       Euglycemia and Avoid Hyperthermia (PRN Acetaminophen)       Antihypertensives PRN if Blood pressure is greater than 220/120 or there is a concern for End organ damage/contraindications for permissive HTN. If blood pressure is greater than 220/120 give labetalol PO or IV or Vasotec IV with a goal of 15% reduction in BP during the first 24 hours.       -ASA 325 mg x 1       -continue ASA 81 mg daily along with ticagrelor 90 mg BID       -atorvastatin 80 mg qhs       -MRI brain, MRA head/neck       -TTE w/ bubble, telemetry       -smoking cessation counseling       -PT/OT/SLP  evaluations  Sign Out:       Discussed with Emergency Department Provider    ------------------------------------------------------------------------------  Advanced Imaging: Advanced Imaging Deferred because:  Non-disabling symptoms as verified by the patient; no cortical signs so not consistent with LVO   Metrics: Last Known Well: 03/31/2023 02:00:00 [facility] Notification Time: 03/31/2023 06:49:50 Arrival Time: 03/31/2023 06:33:00 Stamp Time: 03/31/2023 06:49:50 Initial Response Time: 03/31/2023 06:58:58 Symptoms: slurred speech, left arm numbness, headache. Initial patient interaction: 03/31/2023 07:08:35 NIHSS Assessment Completed: 03/31/2023 07:16:15 Patient is not a candidate for Thrombolytic. Thrombolytic Medical Decision: 03/31/2023 07:16:17 Patient was not deemed candidate for Thrombolytic because of following reasons: Last Well Known Above 4.5 Hours.  CT head showed no acute hemorrhage or acute core infarct. I personally Reviewed the CT Head and it Showed chronic left hemisphere infarcts, no acute abnormalities  Primary Provider Notified of Diagnostic Impression and Management Plan on: 03/31/2023 07:31:24    ------------------------------------------------------------------------------  History of Present Illness: Patient is a 57 year old Female.  Patient was brought by private transportation with symptoms of slurred speech, left arm numbness, headache. Patient reports falling asleep at 2:00 AM, prior to which she was in her usual state of health. She woke up later in the morning with pain in her hands, also  noticed new left arm numbness and heaviness, as well as patch of numbness over right forearm, and stuttering quality to her speech which was new. She does have neurological history pertinent for prior strokes, particularly in left frontal and parietal regions, which affected her right arm. She had been on aspirin and ticagrelor for secondary stroke  prevention (had strokes while on Plavix), but has been off of medications for the past month, except for one white pill that she takes, not sure what it is for.     Past Medical History:      Hypertension      Hyperlipidemia      Stroke      There is no history of Diabetes Mellitus      There is no history of Atrial Fibrillation Othere PMH:  prior left carotid to subclavian artery bypass; chronic tobacco use; GERD  Medications:  No Anticoagulant use  No Antiplatelet use Reviewed EMR for current medications  Allergies:  Reviewed  Social History: Smoking: Yes Alcohol Use: No Drug Use: No  Family History:  There is no family history of premature cerebrovascular disease pertinent to this consultation  ROS : 14 Points Review of Systems was performed and was negative except mentioned in HPI.  Past Surgical History: There Is No Surgical History Contributory To Today's Visit     Examination: BP(139/63), Pulse(65), Blood Glucose(143) 1A: Level of Consciousness - Alert; keenly responsive + 0 1B: Ask Month and Age - 1 Question Right + 1 1C: Blink Eyes & Squeeze Hands - Performs Both Tasks + 0 2: Test Horizontal Extraocular Movements - Normal + 0 3: Test Visual Fields - Partial Hemianopia + 1 4: Test Facial Palsy (Use Grimace if Obtunded) - Normal symmetry + 0 5A: Test Left Arm Motor Drift - Drift, but doesn't hit bed + 1 5B: Test Right Arm Motor Drift - No Drift for 10 Seconds + 0 6A: Test Left Leg Motor Drift - Drift, but doesn't hit bed + 1 6B: Test Right Leg Motor Drift - No Drift for 5 Seconds + 0 7: Test Limb Ataxia (FNF/Heel-Shin) - No Ataxia + 0 8: Test Sensation - Mild-Moderate Loss: Less Sharp/More Dull + 1 9: Test Language/Aphasia - Normal; No aphasia + 0 10: Test Dysarthria - Normal + 0 11: Test Extinction/Inattention - No abnormality + 0  NIHSS Score: 5  NIHSS Free Text : restricted bilateral visual fields  reduced sensation throughout the left side   doesn't know month  Pre-Morbid Modified Rankin Scale: 2 Points = Slight disability; unable to carry out all previous activities, but able to look after own affairs without assistance  Spoke with : Dr. Posey ReaKommor  Patient/Family was informed the Neurology Consult would occur via TeleHealth consult by way of interactive audio and video telecommunications and consented to receiving care in this manner.   Patient is being evaluated for possible acute neurologic impairment and high probability of imminent or life-threatening deterioration. I spent total of 40 minutes providing care to this patient, including time for face to face visit via telemedicine, review of medical records, imaging studies and discussion of findings with providers, the patient and/or family.   Dr Peri JeffersonBradley Yaire Kreher   [facility] For Inpatient follow-up with [facility] physician please call [facility] [phone]. This is not an outpatient service. Post hospital discharge, please contact hospital directly.  Please do not communicate with [facility] physicians via secure chat. If you have any questions, Please contact [facility]. Please call or reconsult our service if there are  any clinical or diagnostic changes.

## 2023-03-31 NOTE — Plan of Care (Signed)
  Problem: Acute Rehab OT Goals (only OT should resolve) Goal: Pt/Caregiver Will Perform Home Exercise Program Flowsheets (Taken 03/31/2023 1021) Pt/caregiver will Perform Home Exercise Program:  Increased strength  Independently  Left upper extremity  Rita [name] OT, MOT

## 2023-03-31 NOTE — H&P (Signed)
History and Physical    Rita [name] ZOX:[id number] DOB: 28-Jul-1966 DOA: 03/31/2023  PCP: [facility]   Patient coming from: Home  Chief Complaint: L arm numbness/weakness  HPI: Rita [name] is a 57 y.o. female with medical history significant for prior CVAs, Barrett's esophagus, chronic back pain, chronic abdominal pain, COPD, hypertension, and dyslipidemia who presented to the ED with difficulty in speech as well as left arm numbness and heaviness that began at approximately 5:30 AM.  She states that she went to bed at approximately 8 PM last night and then woke up again at 2 AM at which point she claimed to not have any symptoms.  She denies any headaches, dizziness, or vision changes.  She states that she has been off of all of her medications over the last 1 month given issues with her insurance.   ED Course: Vital signs with some elevated blood pressure readings noted, leukocytosis of 14,500 and potassium 2.8.  CT of the head with no acute findings and brain MRI pending.  Currently with ongoing speech difficulty as well as left arm numbness and weakness.  Review of Systems: Reviewed as noted above, otherwise negative.  Past Medical History:  Diagnosis Date   Arthritis    "qwhere" (02/22/2018)   Barrett's esophagus with esophagitis 03/26/2013   Chronic abdominal pain    Chronic lower back pain    Chronic pancreatitis    COPD (chronic obstructive pulmonary disease)    Depression    Dyspnea    GERD (gastroesophageal reflux disease)    Heart murmur    Hiatal hernia    High cholesterol    Hypertension    Peptic ulcer    Pneumonia 2000s X 1   Stroke    Tobacco use 03/26/2013    Past Surgical History:  Procedure Laterality Date   AORTIC ARCH ANGIOGRAPHY N/A 01/19/2018   Procedure: AORTIC ARCH ANGIOGRAPHY;  Surgeon: Sherren Kerns, MD;  [facility]: [facility];  Service: Cardiovascular;  Laterality: N/A;   BIOPSY  04/17/2017   Procedure: BIOPSY;  Surgeon:  Corbin Ade, MD;  Location: [facility];  Service: Endoscopy;;  esophageal   CARDIAC CATHETERIZATION     CAROTID-SUBCLAVIAN BYPASS GRAFT Left 03/19/2018   Procedure: BYPASS GRAFT CAROTID-SUBCLAVIAN;  Surgeon: Sherren Kerns, MD;  Location: [facility];  Service: Vascular;  Laterality: Left;   CESAREAN SECTION  1985; 1988   COLONOSCOPY WITH PROPOFOL N/A 04/17/2017   Procedure: COLONOSCOPY WITH PROPOFOL;  Surgeon: Corbin Ade, MD;  [facility]: [facility];  Service: Endoscopy;  Laterality: N/A;  100   ESOPHAGOGASTRODUODENOSCOPY  Oct 2011   Dr. Karilyn Cota: ulcer in distal esophagus, soft stricture at GE junction s/p balloon dilation PATH: BARRETT'S   ESOPHAGOGASTRODUODENOSCOPY (EGD) WITH ESOPHAGEAL DILATION N/A 03/27/2013   Procedure: ESOPHAGOGASTRODUODENOSCOPY (EGD) WITH ESOPHAGEAL DILATION;  Surgeon: Corbin Ade, MD;  [facility];  Service: Endoscopy;  Laterality: N/A;  possible dilation   ESOPHAGOGASTRODUODENOSCOPY (EGD) WITH PROPOFOL N/A 01/02/2017   Procedure: ESOPHAGOGASTRODUODENOSCOPY (EGD) WITH PROPOFOL;  Surgeon: Corbin Ade, MD;  [facility];  Service: Endoscopy;  Laterality: N/A;  with possible esophageal dilation   ESOPHAGOGASTRODUODENOSCOPY (EGD) WITH PROPOFOL N/A 04/17/2017   Procedure: ESOPHAGOGASTRODUODENOSCOPY (EGD) WITH PROPOFOL;  Surgeon: Corbin Ade, MD;  [facility];  Service: Endoscopy;  Laterality: N/A;   EUS N/A 05/09/2013   Procedure: UPPER ENDOSCOPIC ULTRASOUND (EUS) LINEAR;  Surgeon: Rachael Fee, MD;  Location: [facility];  Service: Endoscopy;  Laterality: N/A;   FRACTURE SURGERY     pt. denies   IR ANGIO INTRA EXTRACRAN SEL COM CAROTID INNOMINATE BILAT MOD SED  09/06/2022   IR ANGIO INTRA EXTRACRAN SEL COM CAROTID INNOMINATE BILAT MOD SED  09/13/2022   IR ANGIO VERTEBRAL SEL SUBCLAVIAN INNOMINATE BILAT MOD SED  09/13/2022   IR ANGIO VERTEBRAL SEL SUBCLAVIAN INNOMINATE UNI R MOD SED  09/06/2022   IR ANGIOGRAM FOLLOW UP STUDY   09/13/2022   IR CT HEAD LTD  09/08/2022   IR NEURO EACH ADD'L AFTER BASIC UNI LEFT (MS)  09/13/2022   IR TRANSCATH/EMBOLIZ  09/08/2022   IR US GUIDE VASC ACCESS RIGHT  09/08/2022   IR US GUIDE VASC ACCESS RIGHT  09/06/2022   KNEE ARTHROSCOPY Right    LAPAROSCOPIC CHOLECYSTECTOMY     LEFT HEART CATH AND CORONARY ANGIOGRAPHY N/A 02/26/2018   Procedure: LEFT HEART CATH AND CORONARY ANGIOGRAPHY;  Surgeon: Kathleene Hazel, MD;  [facility];  Service: Cardiovascular;  Laterality: N/A;   MALONEY DILATION N/A 04/17/2017   Procedure: Elease Hashimoto DILATION;  Surgeon: Corbin Ade, MD;  [facility];  Service: Endoscopy;  Laterality: N/A;   PATELLA FRACTURE SURGERY Left    POLYPECTOMY  04/17/2017   Procedure: POLYPECTOMY;  Surgeon: Corbin Ade, MD;  [facility];  Service: Endoscopy;;   RADIOLOGY WITH ANESTHESIA N/A 09/08/2022   Procedure: L ICA Aneurysm Embolazation;  Surgeon: Julieanne Cotton, MD;  Location: [facility];  Service: Radiology;  Laterality: N/A;   TEE WITHOUT CARDIOVERSION N/A 01/23/2019   Procedure: TRANSESOPHAGEAL ECHOCARDIOGRAM (TEE);  Surgeon: Jake Bathe, MD;  Location: [facility] ENDOSCOPY;  Service: Cardiovascular;  Laterality: N/A;   TUBAL LIGATION  1992   UPPER EXTREMITY ANGIOGRAPHY N/A 01/19/2018   Procedure: UPPER EXTREMITY ANGIOGRAPHY;  Surgeon: Sherren Kerns, MD;  [facility];  Service: Cardiovascular;  Laterality: N/A;     reports that she has been smoking cigarettes. She started smoking about 40 years ago. She has a 20.50 pack-year smoking history. She has never used smokeless tobacco. She reports that she does not drink alcohol and does not use drugs.  No Known Allergies  Family History  Problem Relation Age of Onset   Asthma Mother    Heart failure Mother    Cancer Mother        pancreatic   Diabetes Mother    Hypertension Mother    Stroke Mother    Pancreatic cancer Mother        deceased   Heart failure  Father    Diabetes Father    Colon cancer Neg Hx     Prior to Admission medications   Medication Sig Start Date End Date Taking? Authorizing Provider  aspirin EC 81 MG tablet Take 1 tablet (81 mg total) by mouth daily. Swallow whole. 09/10/22   Meredeth Ide, MD  atorvastatin (LIPITOR) 80 MG tablet Take 1 tablet (80 mg total) by mouth daily at 6 PM. 01/24/19   Danford, Earl Lites, MD  Buprenorphine HCl-Naloxone HCl 8-2 MG FILM Take 1 Film by mouth 3 (three) times daily. 12/14/18   [provider]  colchicine 0.6 MG tablet Take 1 tablet (0.6 mg total) by mouth 2 (two) times daily for 7 days. Patient not taking: Reported on 09/01/2022 09/20/19 09/27/19  Claude Manges, PA-C  DEXILANT 60 MG capsule TAKE 1 CAPSULE(60 MG) BY MOUTH AT BEDTIME Patient taking differently: Take 60 mg by mouth  daily. 02/19/19   Gelene MinkBoone, Anna W, NP  ezetimibe (ZETIA) 10 MG tablet Take 1 tablet (10 mg total) by mouth daily. 09/10/22   Meredeth IdeLama, Gagan S, MD  potassium chloride (MICRO-K) 10 MEQ CR capsule Take 10 mEq by mouth 2 (two) times daily. 05/26/22   [provider]  ticagrelor (BRILINTA) 90 MG TABS tablet Take 1 tablet (90 mg total) by mouth 2 (two) times daily. 09/09/22   Meredeth IdeLama, Gagan S, MD    Physical Exam: Vitals:   03/31/23 0658 03/31/23 0700 03/31/23 0715 03/31/23 0730  BP:  139/63 (!) 121/98 139/77  Pulse:  63 71 66  Resp:  16 16 13   Temp:      TempSrc:      SpO2:  99% 100% 99%  Weight: 97.3 kg     Height:        Constitutional: NAD, calm, comfortable Vitals:   03/31/23 0658 03/31/23 0700 03/31/23 0715 03/31/23 0730  BP:  139/63 (!) 121/98 139/77  Pulse:  63 71 66  Resp:  16 16 13   Temp:      TempSrc:      SpO2:  99% 100% 99%  Weight: 97.3 kg     Height:       Eyes: lids and conjunctivae normal Neck: normal, supple Respiratory: clear to auscultation bilaterally. Normal respiratory effort. No accessory muscle use.  Cardiovascular: Regular rate and rhythm, no murmurs. Abdomen: no  tenderness, no distention. Bowel sounds positive.  Musculoskeletal:  No edema. Skin: no rashes, lesions, ulcers.  Psychiatric: Flat affect  Labs on Admission: I have personally reviewed following labs and imaging studies  CBC: Recent Labs  Lab 03/31/23 0640  WBC 14.5*  NEUTROABS 11.5*  HGB 12.7  HCT 36.9  MCV 85.2  PLT 460*   Basic Metabolic Panel: Recent Labs  Lab 03/31/23 0640  NA 138  K 2.8*  CL 106  CO2 20*  GLUCOSE 133*  BUN 9  CREATININE 0.90  CALCIUM 8.6*   GFR: Estimated Creatinine Clearance: 82.6 mL/min (by C-G formula based on SCr of 0.9 mg/dL). Liver Function Tests: Recent Labs  Lab 03/31/23 0640  AST 20  ALT 24  ALKPHOS 80  BILITOT 0.4  PROT 7.1  ALBUMIN 3.7   No results for input(s): "LIPASE", "AMYLASE" in the last 168 hours. No results for input(s): "AMMONIA" in the last 168 hours. Coagulation Profile: Recent Labs  Lab 03/31/23 0640  INR 0.9   Cardiac Enzymes: No results for input(s): "CKTOTAL", "CKMB", "CKMBINDEX", "TROPONINI" in the last 168 hours. BNP (last 3 results) No results for input(s): "PROBNP" in the last 8760 hours. HbA1C: No results for input(s): "HGBA1C" in the last 72 hours. CBG: Recent Labs  Lab 03/31/23 0640  GLUCAP 126*   Lipid Profile: No results for input(s): "CHOL", "HDL", "LDLCALC", "TRIG", "CHOLHDL", "LDLDIRECT" in the last 72 hours. Thyroid Function Tests: No results for input(s): "TSH", "T4TOTAL", "FREET4", "T3FREE", "THYROIDAB" in the last 72 hours. Anemia Panel: No results for input(s): "VITAMINB12", "FOLATE", "FERRITIN", "TIBC", "IRON", "RETICCTPCT" in the last 72 hours. Urine analysis:    Component Value Date/Time   COLORURINE YELLOW 01/20/2019 1430   APPEARANCEUR CLEAR 01/20/2019 1430   LABSPEC 1.013 01/20/2019 1430   PHURINE 8.0 01/20/2019 1430   GLUCOSEU NEGATIVE 01/20/2019 1430   HGBUR NEGATIVE 01/20/2019 1430   BILIRUBINUR NEGATIVE 01/20/2019 1430   KETONESUR 5 (A) 01/20/2019 1430    PROTEINUR NEGATIVE 01/20/2019 1430   UROBILINOGEN 0.2 02/05/2015 2347   NITRITE NEGATIVE 01/20/2019 1430  LEUKOCYTESUR NEGATIVE 01/20/2019 1430    Radiological Exams on Admission: CT HEAD CODE STROKE WO CONTRAST  Addendum Date: 03/31/2023   ADDENDUM REPORT: 03/31/2023 07:08 ADDENDUM: Study discussed by telephone with Dr. Jaci Carrel on 03/31/2023 at 0656 hours. Electronically Signed   By: Odessa Fleming M.D.   On: 03/31/2023 07:08   Result Date: 03/31/2023 CLINICAL DATA:  Code stroke. 57 year old female with sudden onset slurred speech, left upper extremity numbness. Woke with the symptoms at 0530 hours. EXAM: CT HEAD WITHOUT CONTRAST TECHNIQUE: Contiguous axial images were obtained from the base of the skull through the vertex without intravenous contrast. RADIATION DOSE REDUCTION: This exam was performed according to the departmental dose-optimization program which includes automated exposure control, adjustment of the mA and/or kV according to patient size and/or use of iterative reconstruction technique. COMPARISON:  Brain MRI 09/01/2022, head CT 09/02/2022. FINDINGS: Brain: Chronic post ischemic encephalomalacia in the left hemisphere affecting both the left MCA and PCA territories does not appear significantly changed since September. Chronic right greater than left cerebellar infarcts and encephalomalacia is stable. Patchy additional bilateral white matter hypodensity including at the right frontal operculum does not appear significantly changed. No superimposed No midline shift, ventriculomegaly, mass effect, evidence of mass lesion, intracranial hemorrhage or evidence of cortically based acute infarction. Vascular: Left ICA siphon vascular stent with superimpose calcified atherosclerosis at the skull base. No suspicious intracranial vascular hyperdensity. Skull: No acute osseous abnormality identified. Sinuses/Orbits: Visualized paranasal sinuses and mastoids are stable and well aerated. Other: No  gaze deviation. No acute orbit or scalp soft tissue finding. ASPECTS [facility] Stroke Program Early CT Score) Total score (0-10 with 10 being normal): 10 (chronic encephalomalacia). IMPRESSION: 1. No acute cortically based infarct or acute intracranial hemorrhage identified. ASPECTS 10. 2. Stable CT appearance of advanced chronic ischemic disease in the left hemisphere and bilateral cerebellum since last year. Electronically Signed: By: Odessa Fleming M.D. On: 03/31/2023 06:55    EKG: Independently reviewed.   Assessment/Plan Principal Problem:   CVA (cerebral vascular accident) Active Problems:   Barrett's esophagus with esophagitis   Chronic back pain   GERD (gastroesophageal reflux disease)   Hypokalemia   Left subclavian artery occlusion   Acute ischemic stroke   Mixed hyperlipidemia   Obesity, Class II, BMI 35-39.9    Acute ischemic CVA/TIA -Appreciate neurology recommendations -Patient has been off of aspirin and Brilinta for the last 1 month; supposedly on these medications with prior left subclavian artery stenosis -Continue aspirin and Brilinta with no hemorrhage identified on CT head -Brain MRI ordered and pending -Obtain 2D echocardiogram -Lipid panel and A1c -PT/OT evaluation  Hypokalemia -Replete and reevaluate  Opiate dependence/chronic pain -Continue buprenorphine  Dyslipidemia -Continue statin -Check lipid panel  Barrett's esophagus -Continue PPI  Obesity -BMI 33.61   DVT prophylaxis: SCDs Code Status: Full Family Communication: None at bedside Disposition Plan: Admit for CVA evaluation Consults called: Neurology Admission status: Inpatient, telemetry  Severity of Illness: The appropriate patient status for this patient is INPATIENT. Inpatient status is judged to be reasonable and necessary in order to provide the required intensity of service to ensure the patient's safety. The patient's presenting symptoms, physical exam findings, and initial radiographic  and laboratory data in the context of their chronic comorbidities is felt to place them at high risk for further clinical deterioration. Furthermore, it is not anticipated that the patient will be medically stable for discharge from the hospital within 2 midnights of admission.   * I certify that at  the point of admission it is my clinical judgment that the patient will require inpatient hospital care spanning beyond 2 midnights from the point of admission due to high intensity of service, high risk for further deterioration and high frequency of surveillance required.*   Rita [name] D Rita Patty DO [facility]  If 7PM-7AM, please contact night-coverage [email or web address]  03/31/2023, 7:55 AM

## 2023-03-31 NOTE — Progress Notes (Signed)
*  PRELIMINARY RESULTS* Echocardiogram 2D Echocardiogram has been performed.  Stacey Drain 03/31/2023, 4:27 PM

## 2023-03-31 NOTE — ED Notes (Signed)
PT at bedside for evaluation.

## 2023-03-31 NOTE — ED Notes (Addendum)
CODE STROKE PAGED 

## 2023-04-01 DIAGNOSIS — R202 Paresthesia of skin: Secondary | ICD-10-CM | POA: Diagnosis not present

## 2023-04-01 DIAGNOSIS — I639 Cerebral infarction, unspecified: Secondary | ICD-10-CM | POA: Diagnosis not present

## 2023-04-01 LAB — BASIC METABOLIC PANEL
Anion gap: 8 (ref 5–15)
BUN: 16 mg/dL (ref 6–20)
CO2: 23 mmol/L (ref 22–32)
Calcium: 7.8 mg/dL — ABNORMAL LOW (ref 8.9–10.3)
Chloride: 108 mmol/L (ref 98–111)
Creatinine, Ser: 1.06 mg/dL — ABNORMAL HIGH (ref 0.44–1.00)
GFR, Estimated: >60 mL/min (ref >60)
Glucose, Bld: 102 mg/dL — ABNORMAL HIGH (ref 70–99)
Potassium: 2.8 mmol/L — ABNORMAL LOW (ref 3.5–5.1)
Sodium: 139 mmol/L (ref 135–145)

## 2023-04-01 LAB — CBC
HCT: 34.7 % — ABNORMAL LOW (ref 36.0–46.0)
Hemoglobin: 11.4 g/dL — ABNORMAL LOW (ref 12.0–15.0)
MCH: 28.9 pg (ref 26.0–34.0)
MCHC: 32.9 g/dL (ref 30.0–36.0)
MCV: 88.1 fL (ref 80.0–100.0)
Platelets: 382 10*3/uL (ref 150–400)
RBC: 3.94 MIL/uL (ref 3.87–5.11)
RDW: 15 % (ref 11.5–15.5)
WBC: 10.6 10*3/uL — ABNORMAL HIGH (ref 4.0–10.5)
nRBC: 0 % (ref 0.0–0.2)

## 2023-04-01 LAB — MAGNESIUM: Magnesium: 1.9 mg/dL (ref 1.7–2.4)

## 2023-04-01 LAB — POTASSIUM: Potassium: 3.2 mmol/L — ABNORMAL LOW (ref 3.5–5.1)

## 2023-04-01 MED ORDER — POTASSIUM CHLORIDE ER 10 MEQ PO CPCR: 10.0000 meq | ORAL_CAPSULE | Freq: Two times a day (BID) | ORAL | 0 refills | Status: DC

## 2023-04-01 MED ORDER — ASPIRIN 81 MG PO TBEC: 81.0000 mg | DELAYED_RELEASE_TABLET | Freq: Every day | ORAL | 0 refills | Status: AC

## 2023-04-01 MED ORDER — BUPRENORPHINE HCL-NALOXONE HCL 8-2 MG SL FILM: 1.0000 | ORAL_FILM | Freq: Three times a day (TID) | SUBLINGUAL | 0 refills | Status: DC

## 2023-04-01 MED ORDER — EZETIMIBE 10 MG PO TABS: 10.0000 mg | ORAL_TABLET | Freq: Every day | ORAL | 0 refills | Status: DC

## 2023-04-01 MED ORDER — ATORVASTATIN CALCIUM 80 MG PO TABS: 80.0000 mg | ORAL_TABLET | Freq: Every day | ORAL | 0 refills | Status: DC

## 2023-04-01 MED ORDER — DEXLANSOPRAZOLE 60 MG PO CPDR
60.0000 mg | DELAYED_RELEASE_CAPSULE | Freq: Every day | ORAL | 0 refills | Status: DC
Start: 2023-04-01 — End: 2024-06-05

## 2023-04-01 MED ORDER — POTASSIUM CHLORIDE 10 MEQ/100ML IV SOLN
10.0000 meq | INTRAVENOUS | Status: AC
  Administered 2023-04-01 (×4): 10 meq via INTRAVENOUS
  Filled 2023-04-01 (×4): qty 100

## 2023-04-01 MED ORDER — POTASSIUM CHLORIDE CRYS ER 20 MEQ PO TBCR
40.0000 meq | EXTENDED_RELEASE_TABLET | Freq: Once | ORAL | Status: AC
  Administered 2023-04-01: 40 meq via ORAL
  Filled 2023-04-01: qty 2

## 2023-04-01 MED ORDER — TICAGRELOR 90 MG PO TABS: 90.0000 mg | ORAL_TABLET | Freq: Two times a day (BID) | ORAL | 0 refills | Status: AC

## 2023-04-01 NOTE — Care Management Obs Status (Signed)
MEDICARE OBSERVATION STATUS NOTIFICATION   Patient Details  Name: SELVA NOONER MRN: [id number] Date of Birth: 03-23-1966   Medicare Observation Status Notification Given:  Yes    Elliot Gault, LCSW 04/01/2023, 11:41 AM

## 2023-04-01 NOTE — Progress Notes (Signed)
MD notified about patients blood pressure and heart rate. No new orders.

## 2023-04-01 NOTE — Progress Notes (Signed)
Patient slept some during the night. Patient given a one time dose of pain medication. No prn medication given. Patient continues to be monitored by the centralized telemetry department with a heart rhythm of sinus brady with bundle branch block. Patient does not appear to be in any distress at this time. Plan of care ongoing.

## 2023-04-01 NOTE — Progress Notes (Signed)
Ng Discharge Note  Admit Date:  03/31/2023 Discharge date: 04/01/2023   Rita [name] to be D/C'd Home per MD order.  AVS completed. Patient/caregiver able to verbalize understanding.  Discharge Medication: Allergies as of 04/01/2023   No Known Allergies      Medication List     STOP taking these medications    colchicine 0.6 MG tablet       TAKE these medications    aspirin EC 81 MG tablet Take 1 tablet (81 mg total) by mouth daily. Swallow whole.   atorvastatin 80 MG tablet Commonly known as: LIPITOR Take 1 tablet (80 mg total) by mouth daily at 6 PM.   Buprenorphine HCl-Naloxone HCl 8-2 MG Film Take 1 Film by mouth 3 (three) times daily.   dexlansoprazole 60 MG capsule Commonly known as: Dexilant Take 1 capsule (60 mg total) by mouth daily. What changed: See the new instructions.   ezetimibe 10 MG tablet Commonly known as: ZETIA Take 1 tablet (10 mg total) by mouth daily.   potassium chloride 10 MEQ CR capsule Commonly known as: MICRO-K Take 1 capsule (10 mEq total) by mouth 2 (two) times daily.   ticagrelor 90 MG Tabs tablet Commonly known as: BRILINTA Take 1 tablet (90 mg total) by mouth 2 (two) times daily.        Discharge Assessment: Vitals:   03/31/23 2100 04/01/23 0100  BP: 112/70 (!) 97/55  Pulse: 65 (!) 55  Resp: 20 19  Temp: 97.7 F (36.5 C) 97.7 F (36.5 C)  SpO2: 98% 100%   Skin clean, dry and intact without evidence of skin break down, no evidence of skin tears noted. IV catheter discontinued intact. Site without signs and symptoms of complications - no redness or edema noted at insertion site, patient denies c/o pain - only slight tenderness at site.  Dressing with slight pressure applied.  D/c Instructions-Education: Discharge instructions given to patient/family with verbalized understanding. D/c education completed with patient/family including follow up instructions, medication list, d/c activities limitations if indicated, with  other d/c instructions as indicated by MD - patient able to verbalize understanding, all questions fully answered. Patient instructed to return to ED, call 911, or call MD for any changes in condition.  Patient escorted via WC, and D/C home via private auto.  Cristal Ford, LPN 04/02/5461 7:03 PM

## 2023-04-01 NOTE — Care Management CC44 (Signed)
Condition Code 44 Documentation Completed  Patient Details  Name: Rita [name] MRN: [id number] Date of Birth: 1966/03/30   Condition Code 44 given:  Yes Patient signature on Condition Code 44 notice:  Yes Documentation of 2 MD's agreement:  Yes Code 44 added to claim:  Yes    Elliot Gault, LCSW 04/01/2023, 11:41 AM

## 2023-04-01 NOTE — Discharge Summary (Signed)
Physician Discharge Summary  Rita [name] YQM:[id number] DOB: 06/02/1966 DOA: 03/31/2023  PCP: [facility]  Admit date: 03/31/2023  Discharge date: 04/01/2023  Admitted From: Home  Disposition: Home  Recommendations for Outpatient Follow-up:  Follow up with PCP in 1-2 weeks Refills provided on home medications to include aspirin, Brilinta, and statin.  [facility] and patient will be able to afford these medications Follow-up with neurology which has been set up 30-day Holter monitor to be delivered at home for evaluation of any potential arrhythmias Continue other home medications as prior to include home potassium supplementation which has been refilled  Home Health: None  Equipment/Devices: None  Discharge Condition:Stable  CODE STATUS: Full  Diet recommendation: Heart Healthy  Brief/Interim Summary:  Rita [name] is a 57 y.o. female with medical history significant for prior CVAs, Barrett's esophagus, chronic back pain, chronic abdominal pain, COPD, hypertension, and dyslipidemia who presented to the ED with difficulty in speech as well as left arm numbness and heaviness that began at approximately 5:30 AM.  She was evaluated for her left forearm paresthesias and differentials include MRI negative stroke versus left ulnar neuropathy.  She was seen by neurology and CT/MRI imaging with no acute findings noted.  She has been recommended to continue on her aspirin and Brilinta as well as statin and will be referred to neurology to follow-up outpatient as soon as possible.  She will have 30-day Holter monitor delivered to her for monitoring of arrhythmias and no home needs noted.  Discharge Diagnoses:  Principal Problem:   CVA (cerebral vascular accident) Active Problems:   Barrett's esophagus with esophagitis   Chronic back pain   GERD (gastroesophageal reflux disease)   Hypokalemia   Left subclavian artery occlusion   Acute ischemic stroke   Mixed  hyperlipidemia   Obesity, Class II, BMI 35-39.9  Principal discharge diagnosis: Left forearm paresthesias with possible MRI negative stroke versus left ulnar neuropathy.  Discharge Instructions  Discharge Instructions     Ambulatory referral to Neurology   Complete by: As directed    An appointment is requested in approximately: 2 weeks. With Dr Pearlean BrownieSethi is possible. If not available soon, other provider is fine.   Diet - low sodium heart healthy   Complete by: As directed    Increase activity slowly   Complete by: As directed       Allergies as of 04/01/2023   No Known Allergies      Medication List     STOP taking these medications    colchicine 0.6 MG tablet       TAKE these medications    aspirin EC 81 MG tablet Take 1 tablet (81 mg total) by mouth daily. Swallow whole.   atorvastatin 80 MG tablet Commonly known as: LIPITOR Take 1 tablet (80 mg total) by mouth daily at 6 PM.   Buprenorphine HCl-Naloxone HCl 8-2 MG Film Take 1 Film by mouth 3 (three) times daily.   dexlansoprazole 60 MG capsule Commonly known as: Dexilant Take 1 capsule (60 mg total) by mouth daily. What changed: See the new instructions.   ezetimibe 10 MG tablet Commonly known as: ZETIA Take 1 tablet (10 mg total) by mouth daily.   potassium chloride 10 MEQ CR capsule Commonly known as: MICRO-K Take 1 capsule (10 mEq total) by mouth 2 (two) times daily.   ticagrelor 90 MG Tabs tablet Commonly known as: BRILINTA Take 1 tablet (90 mg total) by mouth 2 (two) times daily.  Follow-up Information     [facility] Schedule an appointment as soon as possible for a visit in 1 week(s).   Contact information: [address] [place] [address] [phone]                No Known Allergies  Consultations: Neurology   Procedures/Studies: ECHOCARDIOGRAM COMPLETE  Result Date: 03/31/2023    ECHOCARDIOGRAM REPORT   Patient Name:   Rita [name]  Date of Exam: 03/31/2023 Medical Rec #:  [id number]     Height:       67.0 in Accession #:    [id number]    Weight:       214.6 lb Date of Birth:  10/12/1966     BSA:          2.084 m Patient Age:    56 years      BP:           144/76 mmHg Patient Gender: F             HR:           69 bpm. [facility] Procedure: 2D Echo, Cardiac Doppler and Color Doppler Indications:    Stroke I63.9  History:        Patient has prior history of Echocardiogram examinations, most                 recent 09/02/2022. CAD, Stroke; Risk Factors:Dyslipidemia,                 Hypertension and Current Smoker.  Sonographer:    Celesta GentileBernard White RCS Referring Phys: [id number] Anida Deol D [facility] IMPRESSIONS  1. Left ventricular ejection fraction, by estimation, is 60 to 65%. The left ventricle has normal function. The left ventricle has no regional wall motion abnormalities. There is mild left ventricular hypertrophy. Left ventricular diastolic parameters were normal.  2. Right ventricular systolic function is normal. The right ventricular size is normal. There is normal pulmonary artery systolic pressure.  3. Left atrial size was mildly dilated.  4. The mitral valve is grossly normal. No evidence of mitral valve regurgitation. No evidence of mitral stenosis.  5. The aortic valve has an indeterminant number of cusps. Aortic valve regurgitation is not visualized. Mild aortic valve stenosis. Aortic valve mean gradient measures 9.0 mmHg. Aortic valve Vmax measures 2.20 m/s.  6. The inferior vena cava is normal in size with greater than 50% respiratory variability, suggesting right atrial pressure of 3 mmHg.  7. Increased flow velocities may be secondary to anemia, thyrotoxicosis, hyperdynamic or high flow state. Comparison(s): No significant change from prior study. FINDINGS  Left Ventricle: Left ventricular ejection fraction, by estimation, is 60 to 65%. The left ventricle has normal function. The left ventricle has no regional wall motion  abnormalities. The left ventricular internal cavity size was normal in size. There is  mild left ventricular hypertrophy. Left ventricular diastolic parameters were normal. Right Ventricle: The right ventricular size is normal. No increase in right ventricular wall thickness. Right ventricular systolic function is normal. There is normal pulmonary artery systolic pressure. The tricuspid regurgitant velocity is 2.20 m/s, and  with an assumed right atrial pressure of 3 mmHg, the estimated right ventricular systolic pressure is 22.4 mmHg. Left Atrium: Left atrial size was mildly dilated. Right Atrium: Right atrial size was normal in size. Pericardium: There is no evidence of pericardial effusion. Mitral Valve: The mitral valve is grossly normal. No evidence of mitral valve regurgitation. No  evidence of mitral valve stenosis. Tricuspid Valve: The tricuspid valve is grossly normal. Tricuspid valve regurgitation is trivial. No evidence of tricuspid stenosis. Aortic Valve: The aortic valve has an indeterminant number of cusps. Aortic valve regurgitation is not visualized. Mild aortic stenosis is present. Aortic valve mean gradient measures 9.0 mmHg. Aortic valve peak gradient measures 19.4 mmHg. Aortic valve area, by VTI measures 1.37 cm. Pulmonic Valve: The pulmonic valve was not well visualized. Pulmonic valve regurgitation is not visualized. No evidence of pulmonic stenosis. Aorta: The aortic root is normal in size and structure. Venous: The inferior vena cava is normal in size with greater than 50% respiratory variability, suggesting right atrial pressure of 3 mmHg. IAS/Shunts: No atrial level shunt detected by color flow Doppler.  LEFT VENTRICLE PLAX 2D LVIDd:         4.20 cm   Diastology LVIDs:         2.80 cm   LV e' medial:    9.03 cm/s LV PW:         1.20 cm   LV E/e' medial:  11.0 LV IVS:        1.20 cm   LV e' lateral:   8.81 cm/s LVOT diam:     1.80 cm   LV E/e' lateral: 11.3 LV SV:         75 LV SV Index:    36 LVOT Area:     2.54 cm  RIGHT VENTRICLE RV S prime:     11.60 cm/s TAPSE (M-mode): 2.6 cm LEFT ATRIUM             Index        RIGHT ATRIUM           Index LA diam:        4.00 cm 1.92 cm/m   RA Area:     18.90 cm LA Vol (A2C):   79.3 ml 38.06 ml/m  RA Volume:   58.30 ml  27.98 ml/m LA Vol (A4C):   82.4 ml 39.54 ml/m LA Biplane Vol: 82.8 ml 39.74 ml/m  AORTIC VALVE AV Area (Vmax):    1.46 cm AV Area (Vmean):   1.47 cm AV Area (VTI):     1.37 cm AV Vmax:           220.00 cm/s AV Vmean:          139.000 cm/s AV VTI:            0.545 m AV Peak Grad:      19.4 mmHg AV Mean Grad:      9.0 mmHg LVOT Vmax:         126.00 cm/s LVOT Vmean:        80.300 cm/s LVOT VTI:          0.293 m LVOT/AV VTI ratio: 0.54  AORTA Ao Root diam: 3.00 cm MITRAL VALVE               TRICUSPID VALVE MV Area (PHT): 2.80 cm    TR Peak grad:   19.4 mmHg MV Decel Time: 271 msec    TR Vmax:        220.00 cm/s MV E velocity: 99.40 cm/s MV A velocity: 81.80 cm/s  SHUNTS MV E/A ratio:  1.22        Systemic VTI:  0.29 m  Systemic Diam: 1.80 cm Vishnu Priya Mallipeddi Electronically signed by Winfield Rast Mallipeddi Signature Date/Time: 03/31/2023/4:59:08 PM    Final    CT ANGIO HEAD NECK W WO CM  Result Date: 03/31/2023 CLINICAL DATA:  Stroke follow-up EXAM: CT ANGIOGRAPHY HEAD AND NECK TECHNIQUE: Multidetector CT imaging of the head and neck was performed using the standard protocol during bolus administration of intravenous contrast. Multiplanar CT image reconstructions and MIPs were obtained to evaluate the vascular anatomy. Carotid stenosis measurements (when applicable) are obtained utilizing NASCET criteria, using the distal internal carotid diameter as the denominator. RADIATION DOSE REDUCTION: This exam was performed according to the departmental dose-optimization program which includes automated exposure control, adjustment of the mA and/or kV according to patient size and/or use of iterative  reconstruction technique. CONTRAST:  75mL OMNIPAQUE IOHEXOL 350 MG/ML SOLN COMPARISON:  Same-day CT and MR brain FINDINGS: CT HEAD FINDINGS See same day CT head and MRI brain for intracranial findings. Redemonstrated are chronic infarcts in the left frontoparietal region, left occipital lobe, and bilateral cerebellar hemispheres. CTA NECK FINDINGS Aortic arch: Standard branching. Imaged portion shows no evidence of aneurysm or dissection. The left subclavian artery is occluded near the origin, unchanged from prior exam. There is a left common carotid artery to subclavian artery bypass, which is patent. Right carotid system: No evidence of dissection, stenosis (50% or greater), or occlusion. Left carotid system: No evidence of dissection, stenosis (50% or greater), or occlusion. Vertebral arteries: Bilateral V4 segments are poorly contrast opacified. The left vertebral artery is intermittently contrast opacified with non opacification of the V1 and proximal V2 segments. The right vertebral artery demonstrates contrast opacification in the V1 and V2 segments, and poor opacification in the V3 and V4 segments. These findings are unchanged compared to 09/03/2019. Skeleton: Negative. Other neck: Negative. Upper chest: Negative. Review of the MIP images confirms the above findings CTA HEAD FINDINGS Anterior circulation: There is a stent in the cavernous segment of the left ICA extending to the supraclinoid portion. There is mild narrowing of the origin of the left M1. Posterior circulation: The basilar artery small in caliber, unchanged from prior exam. Bilateral PCAs are poorly contrast opacified, not significantly changed from prior exam. Venous sinuses: As permitted by contrast timing, patent. Anatomic variants: None Review of the MIP images confirms the above findings IMPRESSION: 1. Unchanged occlusion of the left subclavian artery near the origin, with patent left common carotid artery to subclavian artery bypass. 2.  Unchanged occlusion of the left vertebral artery at its origin, with non opacification of the V1 and proximal V2 segments, and poor opacification of the V3 and V4 segments. 3. Unchanged small caliber of the basilar artery and poorly contrast opacified bilateral PCAs. 4. Interval stenting of the cavernous ICA on the left. Stent is patent. 5. See same day CT head and MRI brain for intracranial findings. Electronically Signed   By: Lorenza Cambridge M.D.   On: 03/31/2023 11:06   MR BRAIN WO CONTRAST  Result Date: 03/31/2023 CLINICAL DATA:  Stroke suspected EXAM: MRI HEAD WITHOUT CONTRAST TECHNIQUE: Multiplanar, multiecho pulse sequences of the brain and surrounding structures were obtained without intravenous contrast. COMPARISON:  MR Head 09/01/22, CT head 03/31/23, CTA head/neck 09/02/22 FINDINGS: Brain: No acute infarction, hemorrhage, hydrocephalus, extra-axial collection or mass lesion. Chronic left occipital and bilateral cerebellar infarcts. There is also a chronic infarct in the posterior left frontoparietal region. Sequela mild overall chronic microvascular ischemic change, moderate on the left. Vascular: Decreased flow void in the  bilateral vertebral arteries (V3 segment) a normal flow void is visualized in the basilar artery. Skull and upper cervical spine: Normal marrow signal. Sinuses/Orbits: Mucosal thickening posterior ethmoid air cells on the right. Orbits are unremarkable. No mastoid or middle ear effusion. Other: None. IMPRESSION: 1. No acute intracranial abnormality. 2. Chronic infarcts in the left frontoparietal region, left occipital lobe, and bilateral cerebellar hemispheres. Electronically Signed   By: Lorenza Cambridge M.D.   On: 03/31/2023 09:22   CT HEAD CODE STROKE WO CONTRAST  Addendum Date: 03/31/2023   ADDENDUM REPORT: 03/31/2023 07:08 ADDENDUM: Study discussed by telephone with Dr. Jaci Carrel on 03/31/2023 at 0656 hours. Electronically Signed   By: Odessa Fleming M.D.   On: 03/31/2023 07:08    Result Date: 03/31/2023 CLINICAL DATA:  Code stroke. 57 year old female with sudden onset slurred speech, left upper extremity numbness. Woke with the symptoms at 0530 hours. EXAM: CT HEAD WITHOUT CONTRAST TECHNIQUE: Contiguous axial images were obtained from the base of the skull through the vertex without intravenous contrast. RADIATION DOSE REDUCTION: This exam was performed according to the departmental dose-optimization program which includes automated exposure control, adjustment of the mA and/or kV according to patient size and/or use of iterative reconstruction technique. COMPARISON:  Brain MRI 09/01/2022, head CT 09/02/2022. FINDINGS: Brain: Chronic post ischemic encephalomalacia in the left hemisphere affecting both the left MCA and PCA territories does not appear significantly changed since September. Chronic right greater than left cerebellar infarcts and encephalomalacia is stable. Patchy additional bilateral white matter hypodensity including at the right frontal operculum does not appear significantly changed. No superimposed No midline shift, ventriculomegaly, mass effect, evidence of mass lesion, intracranial hemorrhage or evidence of cortically based acute infarction. Vascular: Left ICA siphon vascular stent with superimpose calcified atherosclerosis at the skull base. No suspicious intracranial vascular hyperdensity. Skull: No acute osseous abnormality identified. Sinuses/Orbits: Visualized paranasal sinuses and mastoids are stable and well aerated. Other: No gaze deviation. No acute orbit or scalp soft tissue finding. ASPECTS [facility] Stroke Program Early CT Score) Total score (0-10 with 10 being normal): 10 (chronic encephalomalacia). IMPRESSION: 1. No acute cortically based infarct or acute intracranial hemorrhage identified. ASPECTS 10. 2. Stable CT appearance of advanced chronic ischemic disease in the left hemisphere and bilateral cerebellum since last year. Electronically Signed: By: Odessa Fleming M.D. On: 03/31/2023 06:55     Discharge Exam: Vitals:   03/31/23 2100 04/01/23 0100  BP: 112/70 (!) 97/55  Pulse: 65 (!) 55  Resp: 20 19  Temp: 97.7 F (36.5 C) 97.7 F (36.5 C)  SpO2: 98% 100%   Vitals:   03/31/23 1700 03/31/23 1940 03/31/23 2100 04/01/23 0100  BP: (!) 115/45 (!) 108/56 112/70 (!) 97/55  Pulse: 60 (!) 57 65 (!) 55  Resp: 20 20 20 19   Temp: 97.6 F (36.4 C) 97.8 F (36.6 C) 97.7 F (36.5 C) 97.7 F (36.5 C)  TempSrc: Oral Oral Oral Oral  SpO2: 100% 100% 98% 100%  Weight:      Height:        General: Pt is alert, awake, not in acute distress Cardiovascular: RRR, S1/S2 +, no rubs, no gallops Respiratory: CTA bilaterally, no wheezing, no rhonchi Abdominal: Soft, NT, ND, bowel sounds + Extremities: no edema, no cyanosis    The results of significant diagnostics from this hospitalization (including imaging, microbiology, ancillary and laboratory) are listed below for reference.     Microbiology: No results found for this or any previous visit (from the past 240  hour(s)).   Labs: BNP (last 3 results) No results for input(s): "BNP" in the last 8760 hours. Basic Metabolic Panel: Recent Labs  Lab 03/31/23 0640 04/01/23 0354 04/01/23 1251  NA 138 139  --   K 2.8* 2.8* 3.2*  CL 106 108  --   CO2 20* 23  --   GLUCOSE 133* 102*  --   BUN 9 16  --   CREATININE 0.90 1.06*  --   CALCIUM 8.6* 7.8*  --   MG  --  1.9  --    Liver Function Tests: Recent Labs  Lab 03/31/23 0640  AST 20  ALT 24  ALKPHOS 80  BILITOT 0.4  PROT 7.1  ALBUMIN 3.7   No results for input(s): "LIPASE", "AMYLASE" in the last 168 hours. No results for input(s): "AMMONIA" in the last 168 hours. CBC: Recent Labs  Lab 03/31/23 0640 04/01/23 0354  WBC 14.5* 10.6*  NEUTROABS 11.5*  --   HGB 12.7 11.4*  HCT 36.9 34.7*  MCV 85.2 88.1  PLT 460* 382   Cardiac Enzymes: No results for input(s): "CKTOTAL", "CKMB", "CKMBINDEX", "TROPONINI" in the last 168  hours. BNP: Invalid input(s): "POCBNP" CBG: Recent Labs  Lab 03/31/23 0640  GLUCAP 126*   D-Dimer No results for input(s): "DDIMER" in the last 72 hours. Hgb A1c Recent Labs    03/31/23 0640  HGBA1C 6.1*   Lipid Profile Recent Labs    03/31/23 0640  CHOL 197  HDL 46  LDLCALC 129*  TRIG 110  CHOLHDL 4.3   Thyroid function studies No results for input(s): "TSH", "T4TOTAL", "T3FREE", "THYROIDAB" in the last 72 hours.  Invalid input(s): "FREET3" Anemia work up No results for input(s): "VITAMINB12", "FOLATE", "FERRITIN", "TIBC", "IRON", "RETICCTPCT" in the last 72 hours. Urinalysis    Component Value Date/Time   COLORURINE STRAW (A) 03/31/2023 1212   APPEARANCEUR CLEAR 03/31/2023 1212   LABSPEC 1.034 (H) 03/31/2023 1212   PHURINE 7.0 03/31/2023 1212   GLUCOSEU NEGATIVE 03/31/2023 1212   HGBUR NEGATIVE 03/31/2023 1212   BILIRUBINUR NEGATIVE 03/31/2023 1212   KETONESUR NEGATIVE 03/31/2023 1212   PROTEINUR NEGATIVE 03/31/2023 1212   UROBILINOGEN 0.2 02/05/2015 2347   NITRITE NEGATIVE 03/31/2023 1212   LEUKOCYTESUR NEGATIVE 03/31/2023 1212   Sepsis Labs Recent Labs  Lab 03/31/23 0640 04/01/23 0354  WBC 14.5* 10.6*   Microbiology No results found for this or any previous visit (from the past 240 hour(s)).   Time coordinating discharge: 35 minutes  SIGNED:   Erick Blinks, DO [facility] 04/01/2023, 1:19 PM  If 7PM-7AM, please contact night-coverage [email or web address]

## 2023-04-01 NOTE — TOC Transition Note (Signed)
Transition of Care [facility]) - CM/SW Discharge Note   Patient Details  Name: Rita [name] MRN: [id number] Date of Birth: 1966-06-27  [facility]) CM/SW Contact:  Elliot Gault, LCSW Phone Number: 04/01/2023, 11:46 AM   Clinical Narrative:     Pt medically stable for dc. Discussed pt's dc meds with [facility]. Pt's Medicaid is active and pt has minimal co-pays. Updated pt on same and she says she can afford.   No other TOC needs for dc.  Expected Discharge Plan: Home/Self Care Barriers to Discharge: Barriers Resolved   Patient Goals and CMS Choice Patient states their goals for this hospitalization and ongoing recovery are:: go home      Expected Discharge Plan and Services Expected Discharge Plan: Home/Self Care In-house Referral: Clinical Social Work     Living arrangements for the past 2 months: Single Family Home Expected Discharge Date: 04/01/23                                    Prior Living Arrangements/Services Living arrangements for the past 2 months: Single Family Home Lives with:: Self Patient language and need for interpreter reviewed:: Yes        Need for Family Participation in Patient Care: No (Comment)   Current home services: DME Criminal Activity/Legal Involvement Pertinent to Current Situation/Hospitalization: No - Comment as needed  Activities of Daily Living Home Assistive Devices/Equipment: None ADL Screening (condition at time of admission) Patient's cognitive ability adequate to safely complete daily activities?: Yes Is the patient deaf or have difficulty hearing?: No Does the patient have difficulty seeing, even when wearing glasses/contacts?: No Does the patient have difficulty concentrating, remembering, or making decisions?: No Patient able to express need for assistance with ADLs?: Yes Does the patient have difficulty dressing or bathing?: No Independently performs ADLs?: Yes (appropriate for developmental age) Does  the patient have difficulty walking or climbing stairs?: No Weakness of Legs: Left Weakness of Arms/Hands: Left  Permission Sought/Granted                  Emotional Assessment   Attitude/Demeanor/Rapport: Engaged Affect (typically observed): Pleasant Orientation: : Oriented to Self, Oriented to Place, Oriented to  Time, Oriented to Situation Alcohol / Substance Use: Not Applicable Psych Involvement: No (comment)  Admission diagnosis:  CVA (cerebral vascular accident) [I63.9] Left-sided weakness [R53.1] Cerebrovascular accident (CVA), unspecified mechanism [I63.9] Patient Active Problem List   Diagnosis Date Noted   CVA (cerebral vascular accident) 03/31/2023   Internal carotid artery stenosis, left 09/08/2022   Opioid dependence 09/02/2022   Obesity, Class II, BMI 35-39.9 09/02/2022   Lung mass 09/02/2022   Impaired glucose tolerance 09/02/2022   Acute ischemic stroke 09/01/2022   Mixed hyperlipidemia 09/01/2022   Cavitating mass in right lower lung lobe (PNA Vs Cancer) 01/21/2019   Gastroenteritis 01/20/2019   Left sided cerebral hemisphere cerebrovascular accident (CVA) 01/20/2019   Stenosis of left subclavian artery 03/19/2018   Chest pain 02/21/2018   Left subclavian artery occlusion 02/21/2018   Rectal bleeding 03/21/2017   Hiatal hernia with gastroesophageal reflux disease and esophagitis    Biliary pain    Pain of upper abdomen    Elevated liver enzymes    Chronic abdominal pain    Pancreatitis 02/06/2015   Acute pancreatitis    Elevated LFTs    Pancreatitis, acute 05/09/2013   Hypokalemia 03/27/2013   Anemia 03/27/2013   Esophageal dysphagia  03/26/2013   Chronic back pain 03/26/2013   GERD (gastroesophageal reflux disease) 03/26/2013   Tobacco use 03/26/2013   Barrett's esophagus with esophagitis 03/26/2013   Elevated lipase 08/20/2011   PCP:  [facility]:   [facility] [phone] - [place], [place] - [address] AT [facility]  [address] [place] [address] Phone: [phone] Fax: [phone]     Social Determinants of Health (SDOH) Interventions    Readmission Risk Interventions     No data to display           Final next level of care: Home/Self Care Barriers to Discharge: Barriers Resolved   Patient Goals and CMS Choice      Discharge Placement                         Discharge Plan and Services Additional resources added to the After Visit Summary for   In-house Referral: Clinical Social Work                                   Social Determinants of Health (SDOH) Interventions SDOH Screenings   Food Insecurity: No Food Insecurity (03/31/2023)  Housing: Low Risk  (03/31/2023)  Transportation Needs: No Transportation Needs (03/31/2023)  Utilities: Not At Risk (03/31/2023)  Tobacco Use: High Risk (03/31/2023)     Readmission Risk Interventions     No data to display

## 2023-04-03 ENCOUNTER — Telehealth: Payer: Self-pay

## 2023-04-03 DIAGNOSIS — I639 Cerebral infarction, unspecified: Secondary | ICD-10-CM

## 2023-04-03 NOTE — Telephone Encounter (Signed)
JB DOD 04/03/23

## 2023-04-03 NOTE — Telephone Encounter (Signed)
-----   Message from Erick Blinks, DO sent at 04/01/2023 10:57 AM EDT ----- Patient will require 30-day Holter monitor to be delivered to home on account of potential CVA.  Thank you.

## 2023-04-26 ENCOUNTER — Telehealth: Payer: Self-pay

## 2023-04-26 NOTE — Telephone Encounter (Signed)
Received cancellation notification from [facility] regarding patients 30 day monitor. Cancelled d/t no activity.

## 2024-05-27 ENCOUNTER — Emergency Department (HOSPITAL_COMMUNITY)

## 2024-05-27 ENCOUNTER — Encounter (HOSPITAL_COMMUNITY)
Admission: EM | Disposition: A | Discharge status: Rehabilitation Facility | Payer: Self-pay | Source: Home / Self Care | Attending: Neurology

## 2024-05-27 ENCOUNTER — Emergency Department (HOSPITAL_COMMUNITY): Admitting: Anesthesiology

## 2024-05-27 ENCOUNTER — Other Ambulatory Visit: Payer: Self-pay

## 2024-05-27 ENCOUNTER — Encounter (HOSPITAL_COMMUNITY): Payer: Self-pay | Admitting: *Deleted

## 2024-05-27 ENCOUNTER — Inpatient Hospital Stay (HOSPITAL_COMMUNITY)

## 2024-05-27 ENCOUNTER — Inpatient Hospital Stay (HOSPITAL_COMMUNITY)
Admission: EM | Admit: 2024-05-27 | Discharge: 2024-06-05 | DRG: 024 | Disposition: A | Discharge status: Rehabilitation Facility | Attending: Neurology | Admitting: Neurology

## 2024-05-27 DIAGNOSIS — E7800 Pure hypercholesterolemia, unspecified: Secondary | ICD-10-CM | POA: Diagnosis present

## 2024-05-27 DIAGNOSIS — J984 Other disorders of lung: Secondary | ICD-10-CM

## 2024-05-27 DIAGNOSIS — I69391 Dysphagia following cerebral infarction: Secondary | ICD-10-CM | POA: Diagnosis not present

## 2024-05-27 DIAGNOSIS — Z7902 Long term (current) use of antithrombotics/antiplatelets: Secondary | ICD-10-CM | POA: Diagnosis not present

## 2024-05-27 DIAGNOSIS — R29718 NIHSS score 18: Secondary | ICD-10-CM | POA: Diagnosis present

## 2024-05-27 DIAGNOSIS — R4701 Aphasia: Secondary | ICD-10-CM | POA: Diagnosis present

## 2024-05-27 DIAGNOSIS — R339 Retention of urine, unspecified: Secondary | ICD-10-CM | POA: Diagnosis not present

## 2024-05-27 DIAGNOSIS — Z800 Family history of malignant neoplasm of digestive organs: Secondary | ICD-10-CM

## 2024-05-27 DIAGNOSIS — G8191 Hemiplegia, unspecified affecting right dominant side: Secondary | ICD-10-CM | POA: Diagnosis present

## 2024-05-27 DIAGNOSIS — Z8249 Family history of ischemic heart disease and other diseases of the circulatory system: Secondary | ICD-10-CM

## 2024-05-27 DIAGNOSIS — Y832 Surgical operation with anastomosis, bypass or graft as the cause of abnormal reaction of the patient, or of later complication, without mention of misadventure at the time of the procedure: Secondary | ICD-10-CM | POA: Diagnosis present

## 2024-05-27 DIAGNOSIS — K861 Other chronic pancreatitis: Secondary | ICD-10-CM | POA: Diagnosis present

## 2024-05-27 DIAGNOSIS — E876 Hypokalemia: Secondary | ICD-10-CM | POA: Diagnosis not present

## 2024-05-27 DIAGNOSIS — H53461 Homonymous bilateral field defects, right side: Secondary | ICD-10-CM | POA: Diagnosis present

## 2024-05-27 DIAGNOSIS — R29709 NIHSS score 9: Secondary | ICD-10-CM | POA: Diagnosis not present

## 2024-05-27 DIAGNOSIS — I6502 Occlusion and stenosis of left vertebral artery: Secondary | ICD-10-CM | POA: Diagnosis present

## 2024-05-27 DIAGNOSIS — T82858A Stenosis of vascular prosthetic devices, implants and grafts, initial encounter: Secondary | ICD-10-CM | POA: Diagnosis present

## 2024-05-27 DIAGNOSIS — Z9889 Other specified postprocedural states: Secondary | ICD-10-CM | POA: Diagnosis not present

## 2024-05-27 DIAGNOSIS — E781 Pure hyperglyceridemia: Secondary | ICD-10-CM | POA: Diagnosis present

## 2024-05-27 DIAGNOSIS — E782 Mixed hyperlipidemia: Secondary | ICD-10-CM | POA: Diagnosis not present

## 2024-05-27 DIAGNOSIS — F32A Depression, unspecified: Secondary | ICD-10-CM | POA: Diagnosis present

## 2024-05-27 DIAGNOSIS — F17210 Nicotine dependence, cigarettes, uncomplicated: Secondary | ICD-10-CM | POA: Diagnosis present

## 2024-05-27 DIAGNOSIS — R414 Neurologic neglect syndrome: Secondary | ICD-10-CM | POA: Diagnosis present

## 2024-05-27 DIAGNOSIS — Z79891 Long term (current) use of opiate analgesic: Secondary | ICD-10-CM

## 2024-05-27 DIAGNOSIS — K219 Gastro-esophageal reflux disease without esophagitis: Secondary | ICD-10-CM | POA: Diagnosis present

## 2024-05-27 DIAGNOSIS — Z833 Family history of diabetes mellitus: Secondary | ICD-10-CM

## 2024-05-27 DIAGNOSIS — Z86718 Personal history of other venous thrombosis and embolism: Secondary | ICD-10-CM

## 2024-05-27 DIAGNOSIS — Z79899 Other long term (current) drug therapy: Secondary | ICD-10-CM

## 2024-05-27 DIAGNOSIS — I639 Cerebral infarction, unspecified: Secondary | ICD-10-CM | POA: Diagnosis not present

## 2024-05-27 DIAGNOSIS — E669 Obesity, unspecified: Secondary | ICD-10-CM | POA: Diagnosis present

## 2024-05-27 DIAGNOSIS — J9601 Acute respiratory failure with hypoxia: Secondary | ICD-10-CM

## 2024-05-27 DIAGNOSIS — F1410 Cocaine abuse, uncomplicated: Secondary | ICD-10-CM | POA: Diagnosis present

## 2024-05-27 DIAGNOSIS — I6522 Occlusion and stenosis of left carotid artery: Secondary | ICD-10-CM | POA: Diagnosis not present

## 2024-05-27 DIAGNOSIS — G8929 Other chronic pain: Secondary | ICD-10-CM | POA: Diagnosis present

## 2024-05-27 DIAGNOSIS — Z720 Tobacco use: Secondary | ICD-10-CM | POA: Diagnosis not present

## 2024-05-27 DIAGNOSIS — I63512 Cerebral infarction due to unspecified occlusion or stenosis of left middle cerebral artery: Principal | ICD-10-CM | POA: Diagnosis present

## 2024-05-27 DIAGNOSIS — I10 Essential (primary) hypertension: Secondary | ICD-10-CM | POA: Diagnosis present

## 2024-05-27 DIAGNOSIS — R739 Hyperglycemia, unspecified: Secondary | ICD-10-CM | POA: Diagnosis not present

## 2024-05-27 DIAGNOSIS — R531 Weakness: Secondary | ICD-10-CM | POA: Diagnosis present

## 2024-05-27 DIAGNOSIS — E1151 Type 2 diabetes mellitus with diabetic peripheral angiopathy without gangrene: Secondary | ICD-10-CM | POA: Diagnosis present

## 2024-05-27 DIAGNOSIS — I63232 Cerebral infarction due to unspecified occlusion or stenosis of left carotid arteries: Secondary | ICD-10-CM | POA: Diagnosis not present

## 2024-05-27 DIAGNOSIS — Z6835 Body mass index (BMI) 35.0-35.9, adult: Secondary | ICD-10-CM

## 2024-05-27 DIAGNOSIS — J449 Chronic obstructive pulmonary disease, unspecified: Secondary | ICD-10-CM | POA: Diagnosis present

## 2024-05-27 DIAGNOSIS — I739 Peripheral vascular disease, unspecified: Secondary | ICD-10-CM | POA: Diagnosis not present

## 2024-05-27 DIAGNOSIS — Z7982 Long term (current) use of aspirin: Secondary | ICD-10-CM

## 2024-05-27 DIAGNOSIS — R29810 Facial weakness: Secondary | ICD-10-CM | POA: Diagnosis present

## 2024-05-27 DIAGNOSIS — Z6839 Body mass index (BMI) 39.0-39.9, adult: Secondary | ICD-10-CM

## 2024-05-27 DIAGNOSIS — E1165 Type 2 diabetes mellitus with hyperglycemia: Secondary | ICD-10-CM | POA: Diagnosis not present

## 2024-05-27 DIAGNOSIS — F1210 Cannabis abuse, uncomplicated: Secondary | ICD-10-CM | POA: Diagnosis present

## 2024-05-27 DIAGNOSIS — I6389 Other cerebral infarction: Secondary | ICD-10-CM | POA: Diagnosis not present

## 2024-05-27 DIAGNOSIS — I6602 Occlusion and stenosis of left middle cerebral artery: Secondary | ICD-10-CM | POA: Diagnosis present

## 2024-05-27 DIAGNOSIS — R918 Other nonspecific abnormal finding of lung field: Secondary | ICD-10-CM

## 2024-05-27 DIAGNOSIS — Z823 Family history of stroke: Secondary | ICD-10-CM

## 2024-05-27 DIAGNOSIS — J9690 Respiratory failure, unspecified, unspecified whether with hypoxia or hypercapnia: Secondary | ICD-10-CM | POA: Diagnosis not present

## 2024-05-27 DIAGNOSIS — K2270 Barrett's esophagus without dysplasia: Secondary | ICD-10-CM | POA: Diagnosis present

## 2024-05-27 DIAGNOSIS — E119 Type 2 diabetes mellitus without complications: Secondary | ICD-10-CM | POA: Diagnosis not present

## 2024-05-27 DIAGNOSIS — Z825 Family history of asthma and other chronic lower respiratory diseases: Secondary | ICD-10-CM

## 2024-05-27 DIAGNOSIS — I779 Disorder of arteries and arterioles, unspecified: Secondary | ICD-10-CM | POA: Diagnosis not present

## 2024-05-27 DIAGNOSIS — Z95828 Presence of other vascular implants and grafts: Secondary | ICD-10-CM | POA: Diagnosis not present

## 2024-05-27 DIAGNOSIS — E785 Hyperlipidemia, unspecified: Secondary | ICD-10-CM | POA: Diagnosis not present

## 2024-05-27 HISTORY — PX: IR CT HEAD LTD: IMG2386

## 2024-05-27 HISTORY — PX: RADIOLOGY WITH ANESTHESIA: SHX6223

## 2024-05-27 HISTORY — PX: IR PERCUTANEOUS ART THROMBECTOMY/INFUSION INTRACRANIAL INC DIAG ANGIO: IMG6087

## 2024-05-27 LAB — POCT I-STAT 7, (LYTES, BLD GAS, ICA,H+H)
Acid-base deficit: 4 mmol/L — ABNORMAL HIGH (ref 0.0–2.0)
Bicarbonate: 22 mmol/L (ref 20.0–28.0)
Calcium, Ion: 1.14 mmol/L — ABNORMAL LOW (ref 1.15–1.40)
HCT: 38 % (ref 36.0–46.0)
Hemoglobin: 12.9 g/dL (ref 12.0–15.0)
O2 Saturation: 99 %
Patient temperature: 97
Potassium: 3.3 mmol/L — ABNORMAL LOW (ref 3.5–5.1)
Sodium: 140 mmol/L (ref 135–145)
TCO2: 23 mmol/L (ref 22–32)
pCO2 arterial: 40.9 mmHg (ref 32–48)
pH, Arterial: 7.334 — ABNORMAL LOW (ref 7.35–7.45)
pO2, Arterial: 167 mmHg — ABNORMAL HIGH (ref 83–108)

## 2024-05-27 LAB — DIFFERENTIAL
Abs Immature Granulocytes: 0.06 10*3/uL (ref 0.00–0.07)
Basophils Absolute: 0.1 10*3/uL (ref 0.0–0.1)
Basophils Relative: 0 %
Eosinophils Absolute: 0.3 10*3/uL (ref 0.0–0.5)
Eosinophils Relative: 3 %
Immature Granulocytes: 1 %
Lymphocytes Relative: 28 %
Lymphs Abs: 3.2 10*3/uL (ref 0.7–4.0)
Monocytes Absolute: 0.6 10*3/uL (ref 0.1–1.0)
Monocytes Relative: 5 %
Neutro Abs: 7.1 10*3/uL (ref 1.7–7.7)
Neutrophils Relative %: 63 %

## 2024-05-27 LAB — CBC
HCT: 39.7 % (ref 36.0–46.0)
Hemoglobin: 13.9 g/dL (ref 12.0–15.0)
MCH: 31.2 pg (ref 26.0–34.0)
MCHC: 35 g/dL (ref 30.0–36.0)
MCV: 89 fL (ref 80.0–100.0)
Platelets: 415 10*3/uL — ABNORMAL HIGH (ref 150–400)
RBC: 4.46 MIL/uL (ref 3.87–5.11)
RDW: 13.9 % (ref 11.5–15.5)
WBC: 11.4 10*3/uL — ABNORMAL HIGH (ref 4.0–10.5)
nRBC: 0 % (ref 0.0–0.2)

## 2024-05-27 LAB — PROTIME-INR
INR: 0.9 (ref 0.8–1.2)
Prothrombin Time: 12.4 s (ref 11.4–15.2)

## 2024-05-27 LAB — COMPREHENSIVE METABOLIC PANEL WITH GFR
ALT: 20 U/L (ref 0–44)
AST: 21 U/L (ref 15–41)
Albumin: 4 g/dL (ref 3.5–5.0)
Alkaline Phosphatase: 73 U/L (ref 38–126)
Anion gap: 13 (ref 5–15)
BUN: 14 mg/dL (ref 6–20)
CO2: 23 mmol/L (ref 22–32)
Calcium: 8.7 mg/dL — ABNORMAL LOW (ref 8.9–10.3)
Chloride: 103 mmol/L (ref 98–111)
Creatinine, Ser: 0.76 mg/dL (ref 0.44–1.00)
GFR, Estimated: >60 mL/min (ref >60)
Glucose, Bld: 115 mg/dL — ABNORMAL HIGH (ref 70–99)
Potassium: 2.8 mmol/L — ABNORMAL LOW (ref 3.5–5.1)
Sodium: 139 mmol/L (ref 135–145)
Total Bilirubin: 0.2 mg/dL (ref 0.0–1.2)
Total Protein: 7.4 g/dL (ref 6.5–8.1)

## 2024-05-27 LAB — MRSA NEXT GEN BY PCR, NASAL: MRSA by PCR Next Gen: NOT DETECTED

## 2024-05-27 LAB — RAPID URINE DRUG SCREEN, HOSP PERFORMED
Amphetamines: NOT DETECTED
Barbiturates: NOT DETECTED
Benzodiazepines: NOT DETECTED
Cocaine: POSITIVE — AB
Opiates: NOT DETECTED
Tetrahydrocannabinol: POSITIVE — AB

## 2024-05-27 LAB — SARS CORONAVIRUS 2 BY RT PCR: SARS Coronavirus 2 by RT PCR: NEGATIVE

## 2024-05-27 LAB — APTT: aPTT: 26 s (ref 24–36)

## 2024-05-27 LAB — HEMOGLOBIN A1C
Hgb A1c MFr Bld: 6.3 % — ABNORMAL HIGH (ref 4.8–5.6)
Mean Plasma Glucose: 134.11 mg/dL

## 2024-05-27 LAB — GLUCOSE, CAPILLARY
Glucose-Capillary: 160 mg/dL — ABNORMAL HIGH (ref 70–99)
Glucose-Capillary: 164 mg/dL — ABNORMAL HIGH (ref 70–99)

## 2024-05-27 LAB — HIV ANTIBODY (ROUTINE TESTING W REFLEX): HIV Screen 4th Generation wRfx: NONREACTIVE

## 2024-05-27 LAB — ETHANOL: Alcohol, Ethyl (B): <15 mg/dL (ref <15)

## 2024-05-27 LAB — CBG MONITORING, ED: Glucose-Capillary: 114 mg/dL — ABNORMAL HIGH (ref 70–99)

## 2024-05-27 SURGERY — RADIOLOGY WITH ANESTHESIA
Anesthesia: General

## 2024-05-27 MED ORDER — ASPIRIN 325 MG PO TABS
ORAL_TABLET | ORAL | Status: AC | PRN
  Administered 2024-05-27: 81 mg

## 2024-05-27 MED ORDER — ACETAMINOPHEN 650 MG RE SUPP: 650.0000 mg | RECTAL | Status: DC | PRN

## 2024-05-27 MED ORDER — ACETAMINOPHEN 325 MG PO TABS
650.0000 mg | ORAL_TABLET | ORAL | Status: DC | PRN
Start: 2024-05-27 — End: 2024-06-06

## 2024-05-27 MED ORDER — TICAGRELOR 90 MG PO TABS
90.0000 mg | ORAL_TABLET | Freq: Two times a day (BID) | ORAL | Status: DC
  Administered 2024-05-28 – 2024-06-04 (×9): 90 mg
  Filled 2024-05-27 (×13): qty 1

## 2024-05-27 MED ORDER — MIDAZOLAM HCL 2 MG/2ML IJ SOLN
1.0000 mg | INTRAMUSCULAR | Status: DC | PRN
  Administered 2024-05-29: 2 mg via INTRAVENOUS
  Filled 2024-05-27: qty 2

## 2024-05-27 MED ORDER — CANGRELOR BOLUS VIA INFUSION
INTRAVENOUS | Status: AC | PRN
  Administered 2024-05-27: 1495.5 ug via INTRAVENOUS

## 2024-05-27 MED ORDER — PROPOFOL 1000 MG/100ML IV EMUL
0.0000 ug/kg/min | INTRAVENOUS | Status: DC
  Administered 2024-05-27: 60 ug/kg/min via INTRAVENOUS
  Administered 2024-05-27: 50 ug/kg/min via INTRAVENOUS
  Administered 2024-05-28: 40 ug/kg/min via INTRAVENOUS
  Administered 2024-05-28: 50 ug/kg/min via INTRAVENOUS
  Administered 2024-05-28: 40 ug/kg/min via INTRAVENOUS
  Administered 2024-05-28 (×3): 50 ug/kg/min via INTRAVENOUS
  Administered 2024-05-29 (×2): 40 ug/kg/min via INTRAVENOUS
  Filled 2024-05-27 (×8): qty 100
  Filled 2024-05-27: qty 200
  Filled 2024-05-27: qty 100

## 2024-05-27 MED ORDER — FENTANYL 2500MCG IN NS 250ML (10MCG/ML) PREMIX INFUSION
INTRAVENOUS | Status: AC
Start: 2024-05-27 — End: 2024-05-27
  Filled 2024-05-27: qty 250

## 2024-05-27 MED ORDER — FENTANYL 2500MCG IN NS 250ML (10MCG/ML) PREMIX INFUSION
0.0000 ug/h | INTRAVENOUS | Status: DC
  Administered 2024-05-27: 50 ug/h via INTRAVENOUS
  Administered 2024-05-28: 80 ug/h via INTRAVENOUS
  Administered 2024-05-28: 125 ug/h via INTRAVENOUS
  Filled 2024-05-27 (×2): qty 250

## 2024-05-27 MED ORDER — PHENYLEPHRINE HCL-NACL 20-0.9 MG/250ML-% IV SOLN
INTRAVENOUS | Status: DC | PRN
  Administered 2024-05-27: 20 ug/min via INTRAVENOUS

## 2024-05-27 MED ORDER — DOCUSATE SODIUM 50 MG/5ML PO LIQD
100.0000 mg | Freq: Two times a day (BID) | ORAL | Status: DC
  Administered 2024-05-27 – 2024-05-30 (×6): 100 mg
  Filled 2024-05-27 (×6): qty 10

## 2024-05-27 MED ORDER — TICAGRELOR 90 MG PO TABS
90.0000 mg | ORAL_TABLET | Freq: Two times a day (BID) | ORAL | Status: DC
  Administered 2024-05-31 – 2024-06-05 (×8): 90 mg via ORAL
  Filled 2024-05-27 (×9): qty 1

## 2024-05-27 MED ORDER — CLEVIDIPINE BUTYRATE 0.5 MG/ML IV EMUL
0.0000 mg/h | INTRAVENOUS | Status: DC
  Filled 2024-05-27: qty 50

## 2024-05-27 MED ORDER — SODIUM CHLORIDE 0.9% FLUSH: 10.0000 mL | INTRAVENOUS | Status: DC | PRN

## 2024-05-27 MED ORDER — LIDOCAINE 2% (20 MG/ML) 5 ML SYRINGE
INTRAMUSCULAR | Status: DC | PRN
  Administered 2024-05-27: 60 mg via INTRAVENOUS

## 2024-05-27 MED ORDER — NITROGLYCERIN 1 MG/10 ML FOR IR/CATH LAB
INTRA_ARTERIAL | Status: AC
  Filled 2024-05-27: qty 10

## 2024-05-27 MED ORDER — TENECTEPLASE FOR STROKE
PACK | INTRAVENOUS | Status: AC
  Administered 2024-05-27: 25 mg via INTRAVENOUS
  Filled 2024-05-27: qty 10

## 2024-05-27 MED ORDER — CANGRELOR TETRASODIUM 50 MG IV SOLR
INTRAVENOUS | Status: AC
  Filled 2024-05-27: qty 50

## 2024-05-27 MED ORDER — TICAGRELOR 60 MG PO TABS
ORAL_TABLET | ORAL | Status: AC | PRN
  Administered 2024-05-27: 90 mg

## 2024-05-27 MED ORDER — PHENYLEPHRINE 80 MCG/ML (10ML) SYRINGE FOR IV PUSH (FOR BLOOD PRESSURE SUPPORT)
PREFILLED_SYRINGE | INTRAVENOUS | Status: DC | PRN
  Administered 2024-05-27 (×2): 160 ug via INTRAVENOUS
  Administered 2024-05-27 (×4): 120 ug via INTRAVENOUS
  Administered 2024-05-27: 160 ug via INTRAVENOUS
  Administered 2024-05-27: 40 ug via INTRAVENOUS
  Administered 2024-05-27: 120 ug via INTRAVENOUS
  Administered 2024-05-27: 160 ug via INTRAVENOUS
  Administered 2024-05-27: 80 ug via INTRAVENOUS
  Administered 2024-05-27 (×2): 120 ug via INTRAVENOUS

## 2024-05-27 MED ORDER — ACETAMINOPHEN 160 MG/5ML PO SOLN
650.0000 mg | ORAL | Status: DC | PRN
  Administered 2024-05-29 – 2024-05-30 (×3): 650 mg
  Filled 2024-05-27 (×3): qty 20.3

## 2024-05-27 MED ORDER — FENTANYL CITRATE PF 50 MCG/ML IJ SOSY: 50.0000 ug | PREFILLED_SYRINGE | INTRAMUSCULAR | Status: DC | PRN

## 2024-05-27 MED ORDER — POLYETHYLENE GLYCOL 3350 17 G PO PACK
17.0000 g | PACK | Freq: Every day | ORAL | Status: DC
  Administered 2024-05-27 – 2024-05-30 (×4): 17 g
  Filled 2024-05-27 (×4): qty 1

## 2024-05-27 MED ORDER — SENNOSIDES-DOCUSATE SODIUM 8.6-50 MG PO TABS
1.0000 | ORAL_TABLET | Freq: Every evening | ORAL | Status: DC | PRN
Start: 2024-05-27 — End: 2024-05-27

## 2024-05-27 MED ORDER — SODIUM CHLORIDE 0.9 % IV SOLN: INTRAVENOUS | Status: DC

## 2024-05-27 MED ORDER — SODIUM CHLORIDE 0.9% FLUSH
10.0000 mL | Freq: Two times a day (BID) | INTRAVENOUS | Status: DC
  Administered 2024-05-28: 10 mL
  Administered 2024-05-28: 30 mL
  Administered 2024-05-29 – 2024-05-30 (×3): 10 mL

## 2024-05-27 MED ORDER — STROKE: EARLY STAGES OF RECOVERY BOOK
Freq: Once | Status: AC
  Administered 2024-05-28: 1
  Filled 2024-05-27: qty 1

## 2024-05-27 MED ORDER — FENTANYL BOLUS VIA INFUSION
30.0000 ug | INTRAVENOUS | Status: DC | PRN
  Administered 2024-05-27 – 2024-05-28 (×2): 30 ug via INTRAVENOUS

## 2024-05-27 MED ORDER — SODIUM CHLORIDE (PF) 0.9 % IJ SOLN
INTRAVENOUS | Status: AC | PRN
  Administered 2024-05-27 (×5): 25 ug via INTRA_ARTERIAL

## 2024-05-27 MED ORDER — ACETAMINOPHEN 325 MG PO TABS: 650.0000 mg | ORAL_TABLET | ORAL | Status: DC | PRN

## 2024-05-27 MED ORDER — ASPIRIN 81 MG PO CHEW
CHEWABLE_TABLET | ORAL | Status: AC
  Filled 2024-05-27: qty 1

## 2024-05-27 MED ORDER — TENECTEPLASE FOR STROKE: 0.2500 mg/kg | PACK | Freq: Once | INTRAVENOUS | Status: AC

## 2024-05-27 MED ORDER — ASPIRIN 81 MG PO CHEW
81.0000 mg | CHEWABLE_TABLET | Freq: Every day | ORAL | Status: DC
  Administered 2024-06-02 – 2024-06-05 (×3): 81 mg via ORAL
  Filled 2024-05-27 (×7): qty 1

## 2024-05-27 MED ORDER — IOHEXOL 300 MG/ML  SOLN
150.0000 mL | Freq: Once | INTRAMUSCULAR | Status: DC | PRN
Start: 2024-05-27 — End: 2024-05-27

## 2024-05-27 MED ORDER — SUCCINYLCHOLINE CHLORIDE 200 MG/10ML IV SOSY
PREFILLED_SYRINGE | INTRAVENOUS | Status: DC | PRN
  Administered 2024-05-27: 120 mg via INTRAVENOUS

## 2024-05-27 MED ORDER — EZETIMIBE 10 MG PO TABS
10.0000 mg | ORAL_TABLET | Freq: Every day | ORAL | Status: DC
  Administered 2024-05-28 – 2024-06-04 (×8): 10 mg
  Filled 2024-05-27 (×8): qty 1

## 2024-05-27 MED ORDER — DEXAMETHASONE SODIUM PHOSPHATE 10 MG/ML IJ SOLN
INTRAMUSCULAR | Status: DC | PRN
  Administered 2024-05-27: 4 mg via INTRAVENOUS

## 2024-05-27 MED ORDER — CLEVIDIPINE BUTYRATE 0.5 MG/ML IV EMUL
0.0000 mg/h | INTRAVENOUS | Status: DC
  Administered 2024-05-27: 16 mg/h via INTRAVENOUS
  Administered 2024-05-27: 2 mg/h via INTRAVENOUS
  Administered 2024-05-28 (×3): 20 mg/h via INTRAVENOUS
  Filled 2024-05-27 (×3): qty 100
  Filled 2024-05-27: qty 50

## 2024-05-27 MED ORDER — ROCURONIUM BROMIDE 10 MG/ML (PF) SYRINGE
PREFILLED_SYRINGE | INTRAVENOUS | Status: DC | PRN
  Administered 2024-05-27: 10 mg via INTRAVENOUS
  Administered 2024-05-27: 70 mg via INTRAVENOUS
  Administered 2024-05-27: 20 mg via INTRAVENOUS

## 2024-05-27 MED ORDER — TICAGRELOR 90 MG PO TABS
ORAL_TABLET | ORAL | Status: AC
  Filled 2024-05-27: qty 1

## 2024-05-27 MED ORDER — FENTANYL CITRATE PF 50 MCG/ML IJ SOSY
50.0000 ug | PREFILLED_SYRINGE | INTRAMUSCULAR | Status: DC | PRN
  Administered 2024-05-27: 100 ug via INTRAVENOUS
  Filled 2024-05-27: qty 2

## 2024-05-27 MED ORDER — CHLORHEXIDINE GLUCONATE CLOTH 2 % EX PADS
6.0000 | MEDICATED_PAD | Freq: Every day | CUTANEOUS | Status: DC
  Administered 2024-05-27: 6 via TOPICAL

## 2024-05-27 MED ORDER — PANTOPRAZOLE SODIUM 40 MG IV SOLR
40.0000 mg | Freq: Every day | INTRAVENOUS | Status: DC
  Administered 2024-05-27 – 2024-06-03 (×8): 40 mg via INTRAVENOUS
  Filled 2024-05-27 (×8): qty 10

## 2024-05-27 MED ORDER — ORAL CARE MOUTH RINSE: 15.0000 mL | OROMUCOSAL | Status: DC | PRN

## 2024-05-27 MED ORDER — OXIDIZED CELLULOSE EX PADS: 1.0000 | MEDICATED_PAD | CUTANEOUS | Status: DC | PRN

## 2024-05-27 MED ORDER — SODIUM CHLORIDE 0.9 % IV SOLN
INTRAVENOUS | Status: AC | PRN
  Administered 2024-05-27: 2 ug/kg/min via INTRAVENOUS

## 2024-05-27 MED ORDER — IOHEXOL 350 MG/ML SOLN
150.0000 mL | Freq: Once | INTRAVENOUS | Status: AC | PRN
  Administered 2024-05-27: 110 mL via INTRA_ARTERIAL

## 2024-05-27 MED ORDER — POTASSIUM CHLORIDE 20 MEQ PO PACK
80.0000 meq | PACK | Freq: Once | ORAL | Status: AC
  Administered 2024-05-27: 80 meq
  Filled 2024-05-27: qty 4

## 2024-05-27 MED ORDER — ASPIRIN 81 MG PO CHEW
81.0000 mg | CHEWABLE_TABLET | Freq: Every day | ORAL | Status: DC
  Administered 2024-05-28 – 2024-06-04 (×6): 81 mg
  Filled 2024-05-27 (×5): qty 1

## 2024-05-27 MED ORDER — SODIUM CHLORIDE 0.9 % IV SOLN: 100.0000 mL/h | INTRAVENOUS | Status: DC

## 2024-05-27 MED ORDER — ACETAMINOPHEN 160 MG/5ML PO SOLN: 650.0000 mg | ORAL | Status: DC | PRN

## 2024-05-27 MED ORDER — SODIUM CHLORIDE 0.9 % IV BOLUS
500.0000 mL | Freq: Once | INTRAVENOUS | Status: AC
  Administered 2024-05-27: 500 mL via INTRAVENOUS

## 2024-05-27 MED ORDER — ORAL CARE MOUTH RINSE
15.0000 mL | OROMUCOSAL | Status: DC
  Administered 2024-05-27 – 2024-05-29 (×22): 15 mL via OROMUCOSAL

## 2024-05-27 MED ORDER — ONDANSETRON HCL 4 MG/2ML IJ SOLN
INTRAMUSCULAR | Status: DC | PRN
  Administered 2024-05-27: 4 mg via INTRAVENOUS

## 2024-05-27 MED ORDER — IOHEXOL 350 MG/ML SOLN
75.0000 mL | Freq: Once | INTRAVENOUS | Status: AC | PRN
  Administered 2024-05-27: 75 mL via INTRAVENOUS

## 2024-05-27 MED ORDER — ATORVASTATIN CALCIUM 80 MG PO TABS
80.0000 mg | ORAL_TABLET | Freq: Every day | ORAL | Status: DC
  Administered 2024-05-28 – 2024-06-03 (×7): 80 mg
  Filled 2024-05-27 (×7): qty 1

## 2024-05-27 MED ORDER — PROPOFOL 10 MG/ML IV BOLUS
INTRAVENOUS | Status: DC | PRN
  Administered 2024-05-27: 50 ug/kg/min via INTRAVENOUS
  Administered 2024-05-27: 140 mg via INTRAVENOUS
  Administered 2024-05-27: 60 mg via INTRAVENOUS
  Administered 2024-05-27: 40 mg via INTRAVENOUS

## 2024-05-27 NOTE — Sedation Documentation (Addendum)
 Pt arrived to IR with 1 IV, attempted IV access later in the case due to need for Cangrelor independent IV infusion. Unsuccessful IV attempts x2 by RN and CRNA x1. Dr Alvira Josephs placed L fem venous sheath to infuse Cangrelor through.

## 2024-05-27 NOTE — Consult Note (Addendum)
 [facility] Telemedicine Consult   Requesting Provider: Dorenda Gandy Consult Participants: Myself, bedside nurse, atrium nurse, Dr. Liam Redhead Location of the provider: [facility] ED  Location of the patient: [name]   This consult was provided via telemedicine with 2-way video and audio communication. The patient was aphasic and family could not be reached but care was provided in this way given the emergent nature of the consultation  Chief Complaint: Right sided weakness   HPI: This is a 58 year old vasculopath with past medical history significant for left carotid subclavian bypass graft (should be on Brilinta  and aspirin ), chronic opiate use (buprenorphine  per chart notes), right lower lung mass in January 2020 which resolved on repeat chest CT 08/2022, smoking  In brief she was in her normal state of health stopping at [facility] per EMS report when at 2:15 PM she had sudden onset right-sided weakness.  She was brought to [facility] for further evaluation but unfortunately family was not present nor were we able to reach them in any of the numbers listed in her chart  Head CT is supported no acute intracranial process and therefore decision was made by chart review and two-physician consent to that there were no significant contraindications to TNK which was administered  LKW: 14:15 per EMS history  Thrombolytic given?: Yes at 1544 by two physician consent with Dr. Liam Redhead. Delays due to attempts to contact family to confirm last known well (unsuccessful) CTA prior to TNK due to taking time to attempt to reach family for consent to avoid delays to further care IR Thrombectomy? Yes, activated  Modified Rankin Scale: 1-No significant post stroke disability and can perform usual duties with stroke symptoms by prior notes  Time of teleneurologist evaluation: 3:12 PM  Exam: Vitals:   05/27/24 1540 05/27/24 1545  BP: (!) 141/81 (!) 141/70  Pulse: 70   Resp: 12 12   SpO2: 98%     General: Distressed appearing Pulmonary: breathing comfortably Cardiac: regular rate and rhythm on monitor   NIH Stroke scale 1A: Level of Consciousness - 0 1B: Ask Month and Age - 2 1C: 'Blink Eyes' & 'Squeeze Hands' - 1 intermittently seems to follow some commands 2: Test Horizontal Extraocular Movements - 0 3: Test Visual Fields - 2 by blink to threat 4: Test Facial Palsy - 1 by grimace 5A: Test Left Arm Motor Drift - 0 5B: Test Right Arm Motor Drift - 4 6A: Test Left Leg Motor Drift - 0 6B: Test Right Leg Motor Drift - 4 7: Test Limb Ataxia - X 8: Test Sensation - 1 9: Test Language/Aphasia- 2 "I don't know" is stated to some questions otherwise no verbal output 10: Test Dysarthria - 1 11: Test Extinction/Inattention - 0 NIHSS score: 18   Imaging Reviewed:   Head CT no acute intracranial process   CTA head and neck Left ICA occlusion, new from 03/2023  Labs reviewed in epic and pertinent values follow:  Basic Metabolic Panel: Recent Labs  Lab 05/27/24 1508  NA 139  K 2.8*  CL 103  CO2 23  GLUCOSE 115*  BUN 14  CREATININE 0.76  CALCIUM  8.7*    CBC: Recent Labs  Lab 05/27/24 1508  WBC 11.4*  NEUTROABS 7.1  HGB 13.9  HCT 39.7  MCV 89.0  PLT 415*    Coagulation Studies: Recent Labs    05/27/24 1508  LABPROT 12.4  INR 0.9       Assessment: Acute left carotid occlusion leading to left  MCA syndrome.  S/p TNK with two physician consent with Dr. Liam Redhead.  Going to IR with two-physician consent with Dr. Alvira Josephs  Recommendations:   # Left carotid occlusion resulting in stroke - Stroke labs HgbA1c, fasting lipid panel - MRI brain 24 hours post TNK - Frequent neuro checks - Echocardiogram - Antiplatelet agents per neuro IR - Risk factor modification - Telemetry monitoring - Blood pressure goal   - Post TNK for 24  hours < 180/105  - Post successful uncomplicated revascularization SBP 120 - 140 for 24 hours; if  complications have arisen or only partial revascularization reach out to interventionalist or neurologist on call for BP goal - PT consult, OT consult, Speech consult, unless patient is back to baseline - To be admitted to stroke team at [facility]   This patient is receiving care for acute neurological changes. There was 70 minutes of care by this provider at the time of service, including time for direct evaluation via telemedicine, review of medical records, imaging studies and discussion of findings with providers, the patient and/or family.  Baldwin Levee MD-PhD [facility] [phone]   If 8pm-8am, please page neurology on call as listed in AMION.  CRITICAL CARE Performed by: Ronnette Coke   Total critical care time: 70 minutes  Critical care time was exclusive of separately billable procedures and treating other patients.  Critical care was necessary to treat or prevent imminent or life-threatening deterioration.  Critical care was time spent personally by me on the following activities: development of treatment plan with patient and/or surrogate as well as nursing, discussions with consultants, evaluation of patient's response to treatment, examination of patient, obtaining history from patient or surrogate, ordering and performing treatments and interventions, ordering and review of laboratory studies, ordering and review of radiographic studies, pulse oximetry and re-evaluation of patient's condition.

## 2024-05-27 NOTE — Consult Note (Signed)
 NAME:  [name], MRN:  [id number], DOB:  June 02, 1966, LOS: 0 ADMISSION DATE:  05/27/2024 CONSULTATION DATE:  Code Stroke, post-procedure vent management   History of Present Illness:  58 year old woman who presented to [facility] as a transfer from [facility] 6/2 with R-sided deficits. LKW 1415. PMHx significant for HTN, HLD, CVA, s/p L carotid subclavian bypass graft (on DAPT with Brilinta /ASA), COPD, tobacco use, GERD with Barrett's esophagus, chronic pancreatitis, chronic opioid use (on buprenorphine ), depression.  Patient presented to [facility] ED as a Code Stroke with R-sided deficits. LKW 1415. CT Head NAICA with remote infarcts in L cerebral hemisphere/bilateral cerebellum, chronic microvascular changes. CTA Head/Neck demonstrated occlusion of the L ICA from the proximal cervical segment to the ICA terminus and multifocal atherosclerosis within the neck as well as occlusion of L vertebral artery in the neck/similar occlusion of proximal L subclavian artery. TNK given 1544. NIR activated.  Patient was taken to [facility] where she had L common carotid arteriogram with complete revascularization of occluded distal L ICA supraclinoid segment, and L M1 segment, with 3 passes achieving TICI 3 revascularization.  Pertinent Medical History:   Past Medical History:  Diagnosis Date   Arthritis    "qwhere" (02/22/2018)   Barrett's esophagus with esophagitis 03/26/2013   Chronic abdominal pain    Chronic lower back pain    Chronic pancreatitis (HCC)    COPD (chronic obstructive pulmonary disease) (HCC)    Depression    Dyspnea    GERD (gastroesophageal reflux disease)    Heart murmur    Hiatal hernia    High cholesterol    Hypertension    Peptic ulcer    Pneumonia 2000s X 1   Stroke (HCC)    Tobacco use 03/26/2013   Significant Hospital Events: Including procedures, antibiotic start and stop dates in addition to other pertinent events   6/2 - Presented as Code Stroke. LKW 1415. CT Head NAICA. CTA Head/Neck  demonstrated occlusion of the L ICA from the proximal cervical segment to the ICA terminus and multifocal atherosclerosis within the neck as well as occlusion of L vertebral artery in the neck/similar occlusion of proximal L subclavian artery. Taken to [facility]. PCCM consulted for post-procedure vent management.  Interim History / Subjective:  PCCM consulted for post-procedure vent management.  Objective:   Blood pressure (!) 141/70, pulse 70, resp. rate 12, weight 99.7 kg, last menstrual period 02/09/2012, SpO2 98%.       No intake or output data in the 24 hours ending 05/27/24 1803 Filed Weights   05/27/24 1534  Weight: 99.7 kg     Physical Exam: General: Adult female, agitated on vent, in NAD. Neuro: Sedated but clearly uncomfortable and somewhat agitated. HEENT: [place]/AT. Sclerae anicteric. ETT in place. Cardiovascular: RRR, no M/R/G.  Lungs: Respirations even and unlabored.  CTA bilaterally, No W/R/R. Abdomen: BS x 4, soft, NT/ND.  Musculoskeletal: No gross deformities, no edema.    Assessment & Plan:   L carotid occlusion with ischemic CVA - s/p NIR for L common carotid arteriogram with complete revascularization of occluded distal L ICA supraclinoid segment, and L M1 segment, with 3 passes achieving TICI 3 revascularization. Hx of CVA. Hx of L carotid subclavian bypass graft (on DAPT). - Stroke team managing. - Post procedure care per NIR. - Per neuro, goal SBP < 120-140, then 180/105 for 24H post-TNK. - Cleviprex  titrated to goal SBP. - Antiplatelet therapy with cangrelor; transition to Brilinta  in AM if follow up CT's are stable. - F/u  MRI Brain, echo, lipid panel, Hgb A1c. - Frequent neuro checks. - PT/OT/SLP when able to participate in care.  Respiratory insufficiency post-procedure, requiring mechanical ventilation. History of COPD. - Continue full vent support. - Daily WUA/SBT. - VAP bundle. - Pulmonary hygiene.  Hx HTN, HLD. - Per neuro, goal SBP < 120-140, then  180/105 for 24H post-TNK. - Cleviprex  titrated to goal SBP. - Resume home statin/Zetia .  Hx GERD with Barrett's esophagus (on home Dexilant ). - PPI.  Hx Chronic opioid use (on buprenorphine  PTA), Depression. - PAD protocol as above  Hypokalemia. - 80 mEq K now. - Follow BMP.  Best Practice: (right click and "Reselect all SmartList Selections" daily)   Diet/type: NPO DVT prophylaxis: SCDs GI prophylaxis: PPI Lines: N/A Foley:  N/A Code Status:  full code Last date of multidisciplinary goals of care discussion [Per Primary Team]  Labs:  CBC: Recent Labs  Lab 05/27/24 1508  WBC 11.4*  NEUTROABS 7.1  HGB 13.9  HCT 39.7  MCV 89.0  PLT 415*   Basic Metabolic Panel: Recent Labs  Lab 05/27/24 1508  NA 139  K 2.8*  CL 103  CO2 23  GLUCOSE 115*  BUN 14  CREATININE 0.76  CALCIUM  8.7*   GFR: CrCl cannot be calculated (Unknown ideal weight.). Recent Labs  Lab 05/27/24 1508  WBC 11.4*   Liver Function Tests: Recent Labs  Lab 05/27/24 1508  AST 21  ALT 20  ALKPHOS 73  BILITOT 0.2  PROT 7.4  ALBUMIN 4.0   No results for input(s): "LIPASE", "AMYLASE" in the last 168 hours. No results for input(s): "AMMONIA" in the last 168 hours.  ABG:    Component Value Date/Time   PHART 7.422 03/14/2018 0958   PCO2ART 43.1 03/14/2018 0958   PO2ART 77.4 (L) 03/14/2018 0958   HCO3 27.6 03/14/2018 0958   TCO2 26 09/01/2022 1550   O2SAT 96.2 03/14/2018 0958    Coagulation Profile: Recent Labs  Lab 05/27/24 1508  INR 0.9   Cardiac Enzymes: No results for input(s): "CKTOTAL", "CKMB", "CKMBINDEX", "TROPONINI" in the last 168 hours.  HbA1C: Hgb A1c MFr Bld  Date/Time Value Ref Range Status  03/31/2023 06:40 AM 6.1 (H) 4.8 - 5.6 % Final    Comment:    (NOTE) Pre diabetes:          5.7%-6.4%  Diabetes:              >6.4%  Glycemic control for   <7.0% adults with diabetes   09/02/2022 03:03 AM 6.2 (H) 4.8 - 5.6 % Final    Comment:    (NOTE) Pre diabetes:           5.7%-6.4%  Diabetes:              >6.4%  Glycemic control for   <7.0% adults with diabetes    CBG: Recent Labs  Lab 05/27/24 1502  GLUCAP 114*   Review of Systems:   Patient is encephalopathic and/or intubated; therefore, history has been obtained from chart review.   Past Medical History:  She,  has a past medical history of Arthritis, Barrett's esophagus with esophagitis (03/26/2013), Chronic abdominal pain, Chronic lower back pain, Chronic pancreatitis (HCC), COPD (chronic obstructive pulmonary disease) (HCC), Depression, Dyspnea, GERD (gastroesophageal reflux disease), Heart murmur, Hiatal hernia, High cholesterol, Hypertension, Peptic ulcer, Pneumonia (2000s X 1), Stroke (HCC), and Tobacco use (03/26/2013).   Surgical History:   Past Surgical History:  Procedure Laterality Date   AORTIC ARCH ANGIOGRAPHY N/A 01/19/2018  Procedure: AORTIC ARCH ANGIOGRAPHY;  Surgeon: Richrd Char, MD;  Location: [facility] INVASIVE CV LAB;  Service: Cardiovascular;  Laterality: N/A;   BIOPSY  04/17/2017   Procedure: BIOPSY;  Surgeon: Suzette Espy, MD;  Location: [facility] ENDO SU[facility];  Service: Endoscopy;;  esophageal   CARDIAC CATHETERIZATION     CAROTID-SUBCLAVIAN BYPASS GRAFT Left 03/19/2018   Procedure: BYPASS GRAFT CAROTID-SUBCLAVIAN;  Surgeon: Richrd Char, MD;  Location: [facility] OR;  Service: Vascular;  Laterality: Left;   CESAREAN SECTION  1985; 1988   COLONOSCOPY WITH PROPOFOL  N/A 04/17/2017   Procedure: COLONOSCOPY WITH PROPOFOL ;  Surgeon: Suzette Espy, MD;  Location: [facility];  Service: Endoscopy;  Laterality: N/A;  100   ESOPHAGOGASTRODUODENOSCOPY  Oct 2011   Dr. Homero Luster: ulcer in distal esophagus, soft stricture at GE junction s/p balloon dilation PATH: BARRETT'S   ESOPHAGOGASTRODUODENOSCOPY (EGD) WITH ESOPHAGEAL DILATION N/A 03/27/2013   Procedure: ESOPHAGOGASTRODUODENOSCOPY (EGD) WITH ESOPHAGEAL DILATION;  Surgeon: Suzette Espy, MD;  [facility]: [facility];  Service:  Endoscopy;  Laterality: N/A;  possible dilation   ESOPHAGOGASTRODUODENOSCOPY (EGD) WITH PROPOFOL  N/A 01/02/2017   Procedure: ESOPHAGOGASTRODUODENOSCOPY (EGD) WITH PROPOFOL ;  Surgeon: Suzette Espy, MD;  [facility]: [facility];  Service: Endoscopy;  Laterality: N/A;  with possible esophageal dilation   ESOPHAGOGASTRODUODENOSCOPY (EGD) WITH PROPOFOL  N/A 04/17/2017   Procedure: ESOPHAGOGASTRODUODENOSCOPY (EGD) WITH PROPOFOL ;  Surgeon: Suzette Espy, MD;  [facility]: [facility];  Service: Endoscopy;  Laterality: N/A;   EUS N/A 05/09/2013   Procedure: UPPER ENDOSCOPIC ULTRASOUND (EUS) LINEAR;  Surgeon: Janel Medford, MD;  Location: [facility] ENDOSCOPY;  Service: Endoscopy;  Laterality: N/A;   FRACTURE SURGERY     pt. denies   IR ANGIO INTRA EXTRACRAN SEL COM CAROTID INNOMINATE BILAT MOD SED  09/06/2022   IR ANGIO INTRA EXTRACRAN SEL COM CAROTID INNOMINATE BILAT MOD SED  09/13/2022   IR ANGIO VERTEBRAL SEL SUBCLAVIAN INNOMINATE BILAT MOD SED  09/13/2022   IR ANGIO VERTEBRAL SEL SUBCLAVIAN INNOMINATE UNI R MOD SED  09/06/2022   IR ANGIOGRAM FOLLOW UP STUDY  09/13/2022   IR CT HEAD LTD  09/08/2022   IR NEURO EACH ADD'L AFTER BASIC UNI LEFT (MS)  09/13/2022   IR TRANSCATH/EMBOLIZ  09/08/2022   IR US  GUIDE VASC ACCESS RIGHT  09/08/2022   IR US  GUIDE VASC ACCESS RIGHT  09/06/2022   KNEE ARTHROSCOPY Right    LAPAROSCOPIC CHOLECYSTECTOMY     LEFT HEART CATH AND CORONARY ANGIOGRAPHY N/A 02/26/2018   Procedure: LEFT HEART CATH AND CORONARY ANGIOGRAPHY;  Surgeon: Odie Benne, MD;  Location: [facility]AS[facility];  Service: Cardiovascular;  Laterality: N/A;   MALONEY DILATION N/A 04/17/2017   Procedure: Londa Rival DILATION;  Surgeon: Suzette Espy, MD;  [facility];  Service: Endoscopy;  Laterality: N/A;   PATELLA FRACTURE SURGERY Left    POLYPECTOMY  04/17/2017   Procedure: POLYPECTOMY;  Surgeon: Suzette Espy, MD;  [facility]: [facility];  Service: Endoscopy;;   RADIOLOGY WITH ANESTHESIA N/A 09/08/2022    Procedure: L ICA Aneurysm Embolazation;  Surgeon: Luellen Sages, MD;  Location: [facility] OR;  Service: Radiology;  Laterality: N/A;   TEE WITHOUT CARDIOVERSION N/A 01/23/2019   Procedure: TRANSESOPHAGEAL ECHOCARDIOGRAM (TEE);  Surgeon: Hugh Madura, MD;  Location: [facility] ENDOSCOPY;  Service: Cardiovascular;  Laterality: N/A;   TUBAL LIGATION  1992   UPPER EXTREMITY ANGIOGRAPHY N/A 01/19/2018   Procedure: UPPER EXTREMITY ANGIOGRAPHY;  Surgeon: Richrd Char, MD;  Location: [facility] INVASIVE CV  LAB;  Service: Cardiovascular;  Laterality: N/A;    Social History:   reports that she has been smoking cigarettes. She started smoking about 41 years ago. She has a 20.8 pack-year smoking history. She has never used smokeless tobacco. She reports that she does not drink alcohol  and does not use drugs.   Family History:  Her family history includes Asthma in her mother; Cancer in her mother; Diabetes in her father and mother; Heart failure in her father and mother; Hypertension in her mother; Pancreatic cancer in her mother; Stroke in her mother. There is no history of Colon cancer.   Allergies: No Known Allergies   Home Medications: Prior to Admission medications   Medication Sig Start Date End Date Taking? Authorizing Provider  atorvastatin  (LIPITOR ) 80 MG tablet Take 1 tablet (80 mg total) by mouth daily at 6 PM. 04/01/23 05/31/23  Mason Sole, Pratik D, DO  Buprenorphine  HCl-Naloxone  HCl 8-2 MG FILM Take 1 Film by mouth 3 (three) times daily. 04/01/23   Mason Sole, Pratik D, DO  dexlansoprazole  (DEXILANT ) 60 MG capsule Take 1 capsule (60 mg total) by mouth daily. 04/01/23 05/01/23  Mason Sole, Pratik D, DO  ezetimibe  (ZETIA ) 10 MG tablet Take 1 tablet (10 mg total) by mouth daily. 04/01/23 05/01/23  Mason Sole, Pratik D, DO  potassium chloride  (MICRO-K ) 10 MEQ CR capsule Take 1 capsule (10 mEq total) by mouth 2 (two) times daily. 04/01/23 05/31/23  Doreene Gammon D, DO    Critical care time:    Rafael Bun, PA - [facility] For pager details, please see AMION or use Epic chat  After 1900, please call [facility] for cross coverage needs 05/27/2024, 10:51 PM

## 2024-05-27 NOTE — Progress Notes (Signed)
 800cc on bladder scan. We were attempting to avoid cath/foley as she is s/p tnkase and on cangrelor. We waited for an hour, still has not voided. Patient very uncomfortable and suspect this is contributing to her hypertension. Discussed with [name], will be gentle and use a small size foley catheter.  Tiara Bartoli [facility]

## 2024-05-27 NOTE — Final Progress Note (Addendum)
 Code stroke activated at 1458, pt arrived by EMS. Dr. Liam Redhead at bedside on arrival. Pt with right sided weakness. LKWT reported as 1415 by EMS. Pt taken to CT at 1507. Dr. Cleone Dad joined tele-neuro cart for exam at 1511. Pt taken back for CTA at 1525. Pt taken back to room in ED at 1534. TNK decision made by Dr. Cleone Dad and Dr. Liam Redhead. 25 mg TNK given at 1544 by bedside RN. Dr. Cleone Dad initiated code IR as well and spoke with Dr. Alvira Josephs at [phone]. Pt transferred to [facility] by EMS at 1553.

## 2024-05-27 NOTE — ED Notes (Signed)
 Called IR with no answer

## 2024-05-27 NOTE — ED Triage Notes (Signed)
 Pt BIB [facility] from home for c/o stroke; symptoms started around 1415 today; daughter states pt has some right side weakness and slurred speech   VS with EMS  BP 142/71 HR 99 P2 97% on RA

## 2024-05-27 NOTE — H&P (Signed)
 NEUROLOGY H&P   Date of service: May 27, 2024 Patient Name: Rita [name] MRN:  [id number] DOB:  1966/10/31 Chief Complaint: code IR Requesting Provider: Eleni Griffin, MD  History of Present Illness   Patient is transferred from [facility] for Code IR. She is not able to provide history 2/2 aphasia therefore information taken from telestroke consult note by Dr. Cleone Dad same day:  "This is a 58 year old vasculopath with past medical history significant for left carotid subclavian bypass graft (should be on Brilinta  and aspirin ), chronic opiate use (buprenorphine  per chart notes), right lower lung mass in January 2020 which resolved on repeat chest CT 08/2022, smoking   In brief she was in her normal state of health stopping at [facility] per EMS report when at 2:15 PM she had sudden onset right-sided weakness.  She was brought to [facility] for further evaluation but unfortunately family was not present nor were we able to reach them in any of the numbers listed in her chart   Head CT is supported no acute intracranial process and therefore decision was made by chart review and two-physician consent to that there were no significant contraindications to TNK which was administered   LKW: 14:15 per EMS history  Thrombolytic given?: Yes at 1544 by two physician consent with Dr. Liam Redhead. Delays due to attempts to contact family to confirm last known well (unsuccessful) IR Thrombectomy? Yes, activated  Modified Rankin Scale: 1-No significant post stroke disability and can perform usual duties with stroke symptoms by prior notes"    ROS  Unable to ascertain due to aphasia  Past History   Past Medical History:  Diagnosis Date   Arthritis    "qwhere" (02/22/2018)   Barrett's esophagus with esophagitis 03/26/2013   Chronic abdominal pain    Chronic lower back pain    Chronic pancreatitis (HCC)    COPD (chronic obstructive pulmonary disease) (HCC)    Depression    Dyspnea    GERD  (gastroesophageal reflux disease)    Heart murmur    Hiatal hernia    High cholesterol    Hypertension    Peptic ulcer    Pneumonia 2000s X 1   Stroke (HCC)    Tobacco use 03/26/2013    Past Surgical History:  Procedure Laterality Date   AORTIC ARCH ANGIOGRAPHY N/A 01/19/2018   Procedure: AORTIC ARCH ANGIOGRAPHY;  Surgeon: Richrd Char, MD;  [facility]: [facility] INVASIVE CV LAB;  Service: Cardiovascular;  Laterality: N/A;   BIOPSY  04/17/2017   Procedure: BIOPSY;  Surgeon: Suzette Espy, MD;  Location: [facility] ENDO SUITE;  Service: Endoscopy;;  esophageal   CARDIAC CATHETERIZATION     CAROTID-SUBCLAVIAN BYPASS GRAFT Left 03/19/2018   Procedure: BYPASS GRAFT CAROTID-SUBCLAVIAN;  Surgeon: Richrd Char, MD;  Location: [facility] OR;  Service: Vascular;  Laterality: Left;   CESAREAN SECTION  1985; 1988   COLONOSCOPY WITH PROPOFOL  N/A 04/17/2017   Procedure: COLONOSCOPY WITH PROPOFOL ;  Surgeon: Suzette Espy, MD;  Location: [facility]O [facility];  Service: Endoscopy;  Laterality: N/A;  100   ESOPHAGOGASTRODUODENOSCOPY  Oct 2011   Dr. Homero Luster: ulcer in distal esophagus, soft stricture at GE junction s/p balloon dilation PATH: BARRETT'S   ESOPHAGOGASTRODUODENOSCOPY (EGD) WITH ESOPHAGEAL DILATION N/A 03/27/2013   Procedure: ESOPHAGOGASTRODUODENOSCOPY (EGD) WITH ESOPHAGEAL DILATION;  Surgeon: Suzette Espy, MD;  Location: [facility];  Service: Endoscopy;  Laterality: N/A;  possible dilation   ESOPHAGOGASTRODUODENOSCOPY (EGD) WITH PROPOFOL  N/A 01/02/2017   Procedure: ESOPHAGOGASTRODUODENOSCOPY (EGD) WITH PROPOFOL ;  Surgeon: Suzette Espy, MD;  Location: [facility]ITE;  Service: Endoscopy;  Laterality: N/A;  with possible esophageal dilation   ESOPHAGOGASTRODUODENOSCOPY (EGD) WITH PROPOFOL  N/A 04/17/2017   Procedure: ESOPHAGOGASTRODUODENOSCOPY (EGD) WITH PROPOFOL ;  Surgeon: Suzette Espy, MD;  Location: [facility];  Service: Endoscopy;  Laterality: N/A;   EUS N/A 05/09/2013   Procedure: UPPER ENDOSCOPIC ULTRASOUND  (EUS) LINEAR;  Surgeon: Janel Medford, MD;  Location: [facility] ENDOSCOPY;  Service: Endoscopy;  Laterality: N/A;   FRACTURE SURGERY     pt. denies   IR ANGIO INTRA EXTRACRAN SEL COM CAROTID INNOMINATE BILAT MOD SED  09/06/2022   IR ANGIO INTRA EXTRACRAN SEL COM CAROTID INNOMINATE BILAT MOD SED  09/13/2022   IR ANGIO VERTEBRAL SEL SUBCLAVIAN INNOMINATE BILAT MOD SED  09/13/2022   IR ANGIO VERTEBRAL SEL SUBCLAVIAN INNOMINATE UNI R MOD SED  09/06/2022   IR ANGIOGRAM FOLLOW UP STUDY  09/13/2022   IR CT HEAD LTD  09/08/2022   IR NEURO EACH ADD'L AFTER BASIC UNI LEFT (MS)  09/13/2022   IR TRANSCATH/EMBOLIZ  09/08/2022   IR US  GUIDE VASC ACCESS RIGHT  09/08/2022   IR US  GUIDE VASC ACCESS RIGHT  09/06/2022   KNEE ARTHROSCOPY Right    LAPAROSCOPIC CHOLECYSTECTOMY     LEFT HEART CATH AND CORONARY ANGIOGRAPHY N/A 02/26/2018   Procedure: LEFT HEART CATH AND CORONARY ANGIOGRAPHY;  Surgeon: Odie Benne, MD;  [facility]: [facility];  Service: Cardiovascular;  Laterality: N/A;   MALONEY DILATION N/A 04/17/2017   Procedure: Londa Rival DILATION;  Surgeon: Suzette Espy, MD;  Location: [facility];  Service: Endoscopy;  Laterality: N/A;   PATELLA FRACTURE SURGERY Left    POLYPECTOMY  04/17/2017   Procedure: POLYPECTOMY;  Surgeon: Suzette Espy, MD;  Location: [facility];  Service: Endoscopy;;   RADIOLOGY WITH ANESTHESIA N/A 09/08/2022   Procedure: L ICA Aneurysm Embolazation;  Surgeon: Luellen Sages, MD;  Location: [facility] OR;  Service: Radiology;  Laterality: N/A;   TEE WITHOUT CARDIOVERSION N/A 01/23/2019   Procedure: TRANSESOPHAGEAL ECHOCARDIOGRAM (TEE);  Surgeon: Hugh Madura, MD;  Location: [facility] ENDOSCOPY;  Service: Cardiovascular;  Laterality: N/A;   TUBAL LIGATION  1992   UPPER EXTREMITY ANGIOGRAPHY N/A 01/19/2018   Procedure: UPPER EXTREMITY ANGIOGRAPHY;  Surgeon: Richrd Char, MD;  Location: [facility] CV LAB;  Service: Cardiovascular;  Laterality: N/A;    Family History: Family History   Problem Relation Age of Onset   Asthma Mother    Heart failure Mother    Cancer Mother        pancreatic   Diabetes Mother    Hypertension Mother    Stroke Mother    Pancreatic cancer Mother        deceased   Heart failure Father    Diabetes Father    Colon cancer Neg Hx     Social History  reports that she has been smoking cigarettes. She started smoking about 41 years ago. She has a 20.8 pack-year smoking history. She has never used smokeless tobacco. She reports that she does not drink alcohol  and does not use drugs.  No Known Allergies  Medications   Current Facility-Administered Medications:    [START ON 05/28/2024]  stroke: early stages of recovery book, , Does not apply, Once, Eleni Griffin, MD   [facility] sodium chloride  0.9 % bolus 500 mL, 500 mL, Intravenous, Once, Last Rate: 500 mL/hr at 05/27/24 1555, 500 mL at 05/27/24 1555 **FOLLOWED BY** 0.9 %  sodium chloride  infusion, 100 mL/hr, Intravenous, Continuous, Dorenda Gandy, MD, New Bag at 05/27/24 1629   0.9 %  sodium chloride  infusion, , Intravenous, Continuous, Eleni Griffin, MD   acetaminophen  (TYLENOL ) tablet 650 mg, 650 mg, Oral, Q4H PRN **OR** acetaminophen  (TYLENOL ) 160 MG/5ML solution 650 mg, 650 mg, Per Tube, Q4H PRN **OR** acetaminophen  (TYLENOL ) suppository 650 mg, 650 mg, Rectal, Q4H PRN, Eleni Griffin, MD   aspirin  tablet, , , PRN, Deveshwar, Sanjeev, MD, 81 mg at 05/27/24 1723   cangrelor (KENGREAL) 50,000 mcg in sodium chloride  0.9 % 250 mL (200 mcg/mL) infusion, , , Continuous PRN, Deveshwar, Sanjeev, MD, Last Rate: 59.8 mL/hr at 05/27/24 1803, 2 mcg/kg/min at 05/27/24 1803   cangrelor (KENGREAL) bolus via infusion, , , PRN, Deveshwar, Sanjeev, MD, 1,495.5 mcg at 05/27/24 1802   Chlorhexidine  Gluconate Cloth 2 % PADS 6 each, 6 each, Topical, Daily, Eleni Griffin, MD   clevidipine  (CLEVIPREX ) infusion 0.5 mg/mL, 0-21 mg/hr, Intravenous, Continuous, Eleni Griffin, MD   docusate (COLACE) 50  MG/5ML liquid 100 mg, 100 mg, Per Tube, BID, Alayne Hubert, Stephanie M, PA-C   fentaNYL  (SUBLIMAZE ) injection 50 mcg, 50 mcg, Intravenous, Q15 min PRN, Alayne Hubert, Stephanie M, PA-C   fentaNYL  (SUBLIMAZE ) injection 50-200 mcg, 50-200 mcg, Intravenous, Q30 min PRN, Alayne Hubert, Stephanie M, PA-C   nitroGLYCERIN  25 mcg in sodium chloride  (PF) 0.9 % 59.71 mL (25 mcg/mL) syringe, , Intra-arterial, PRN, Deveshwar, Sanjeev, MD, 25 mcg at 05/27/24 1825   Oral care mouth rinse, 15 mL, Mouth Rinse, Q2H, Alayne Hubert, Stephanie M, PA-C   Oral care mouth rinse, 15 mL, Mouth Rinse, PRN, Alayne Hubert, Stephanie M, PA-C   pantoprazole  (PROTONIX ) injection 40 mg, 40 mg, Intravenous, QHS, Delos Klich M, MD   polyethylene glycol (MIRALAX  / GLYCOLAX ) packet 17 g, 17 g, Per Tube, Daily, Alayne Hubert, Stephanie M, PA-C   propofol  (DIPRIVAN ) 1000 MG/100ML infusion, 0-80 mcg/kg/min, Intravenous, Continuous, Alayne Hubert, Stephanie M, PA-C   ticagrelor  (BRILINTA ) tablet, , , PRN, Deveshwar, Sanjeev, MD, 90 mg at 05/27/24 1723  Facility-Administered Medications Ordered in Other Encounters:    dexamethasone  (DECADRON ) injection, , Intravenous, Anesthesia Intra-op, Arlyne Bering A, CRNA, 4 mg at 05/27/24 1652   lidocaine  2% (20 mg/mL) 5 mL syringe, , Intravenous, Anesthesia Intra-op, Candance Certain, CRNA, 60 mg at 05/27/24 1634   ondansetron  (ZOFRAN ) injection, , Intravenous, Anesthesia Intra-op, Arlyne Bering A, CRNA, 4 mg at 05/27/24 1652   phenylephrine  (NEO-SYNEPHRINE) 20mg /NS premix infusion, , Intravenous, Continuous PRN, Candance Certain, CRNA, Stopped at 05/27/24 1830   PHENYLephrine  80 mcg/ml in normal saline Adult IV Push Syringe (For Blood Pressure Support), , Intravenous, Anesthesia Intra-op, Candance Certain, CRNA, 40 mcg at 05/27/24 1829   propofol  (DIPRIVAN ) 10 mg/mL bolus/IV push, , Intravenous, Anesthesia Intra-op, Candance Certain, CRNA, 40 mg at 05/27/24 1840   rocuronium  (ZEMURON ) injection, , Intravenous, Anesthesia Intra-op, Candance Certain, CRNA,  20 mg at 05/27/24 1805   succinylcholine  (ANECTINE ) syringe, , Intravenous, Anesthesia Intra-op, Arlyne Bering A, CRNA, 120 mg at 05/27/24 1634  Vitals   Vitals:   05/27/24 1534 05/27/24 1540 05/27/24 1545  BP:  (!) 141/81 (!) 141/70  Pulse:  70   Resp:  12 12  SpO2:  98%   Weight: 99.7 kg      Body mass index is 34.43 kg/m.  Physical Exam   General: Distressed appearing Pulmonary: breathing comfortably Cardiac: regular rate and rhythm on monitor    NIH Stroke scale 1A: Level of Consciousness - 0  1B: Ask Month and Age - 2 1C: 'Blink Eyes' & 'Squeeze Hands' - 1 intermittently seems to follow some commands 2: Test Horizontal Extraocular Movements - 0 3: Test Visual Fields - 2 by blink to threat 4: Test Facial Palsy - 1 by grimace 5A: Test Left Arm Motor Drift - 0 5B: Test Right Arm Motor Drift - 4 6A: Test Left Leg Motor Drift - 0 6B: Test Right Leg Motor Drift - 4 7: Test Limb Ataxia - X 8: Test Sensation - 1 9: Test Language/Aphasia- 2 "I don't know" is stated to some questions otherwise no verbal output 10: Test Dysarthria - 1 11: Test Extinction/Inattention - 0 NIHSS score: 18  Labs/Imaging/Neurodiagnostic studies   CBC:  Recent Labs  Lab June 07, 2024 1508  WBC 11.4*  NEUTROABS 7.1  HGB 13.9  HCT 39.7  MCV 89.0  PLT 415*   Basic Metabolic Panel:  Lab Results  Component Value Date   NA 139 2024-06-07   K 2.8 (L) June 07, 2024   CO2 23 2024/06/07   GLUCOSE 115 (H) 2024-06-07   BUN 14 June 07, 2024   CREATININE 0.76 2024/06/07   CALCIUM  8.7 (L) 07-Jun-2024   GFRNONAA >60 June 07, 2024   GFRAA >60 01/24/2019   Lipid Panel:  Lab Results  Component Value Date   LDLCALC 129 (H) 03/31/2023   HgbA1c:  Lab Results  Component Value Date   HGBA1C 6.1 (H) 03/31/2023   Urine Drug Screen:     Component Value Date/Time   LABOPIA NONE DETECTED 03/31/2023 1212   COCAINSCRNUR POSITIVE (A) 03/31/2023 1212   LABBENZ NONE DETECTED 03/31/2023 1212   AMPHETMU NONE  DETECTED 03/31/2023 1212   THCU POSITIVE (A) 03/31/2023 1212   LABBARB NONE DETECTED 03/31/2023 1212    Alcohol  Level     Component Value Date/Time   ETH <15 06-07-24 1508   INR  Lab Results  Component Value Date   INR 0.9 06-07-2024   APTT  Lab Results  Component Value Date   APTT 26 06/07/24   AED levels: No results found for: "PHENYTOIN", "ZONISAMIDE", "LAMOTRIGINE", "LEVETIRACETA"  Head CT no acute intracranial process    CTA head and neck Left ICA occlusion, new from 03/2023  All CNS imaging personally reviewed  ASSESSMENT    Acute left carotid occlusion leading to left MCA syndrome.  S/p TNK with two physician consent with Dr. Cleone Dad and Dr. Liam Redhead.  Going to IR with two-physician consent Dr. Cleone Dad and with Dr. Alvira Josephs   RECOMMENDATIONS   # Left carotid occlusion resulting in stroke - Patient currently in IR, to be admitted to Neuro ICU afterwards under Dr. Greg Leaks - Stroke labs HgbA1c, fasting lipid panel - MRI brain 24 hours post TNK - Frequent neuro checks - Echocardiogram - Antiplatelet agents per neuro IR - Risk factor modification - Telemetry monitoring - Blood pressure goal              - Post TNK for 24  hours < 180/105             - Post successful uncomplicated revascularization SBP 120 - 140 for 24 hours; if complications have arisen or only partial revascularization reach out to interventionalist or neurologist on call for BP goal - PT consult, OT consult, Speech consult, unless patient is back to baseline  Addendum 1900: D/w Dr. Alvira Josephs. Patient left intubated CCM consulted. She had to be placed on cangrelow 2/2 recurrent occlusion 2/2 platelet aggregation. Cangrelow is scheduled to stop at 2205. There is a  timed head CT scheduled for 2230 which Dr. Murvin Arthurs will review to r/o ICH. There will be another timed head CT at 0600 to r/o evolving hemorrhage and if negative stroke day team should start brilinta  90mg  bid first dose  tomorrow AM.  This patient is critically ill and at significant risk of neurological worsening, death and care requires constant monitoring of vital signs, hemodynamics,respiratory and cardiac monitoring, neurological assessment, discussion with family, other specialists and medical decision making of high complexity. I spent 70 minutes of neurocritical care time  in the care of  this patient. This was time spent independent of any time provided by nurse practitioner or PA.  Greg Leaks, MD [facility] [phone]  If 7pm- 7am, please page neurology on call as listed in AMION.    ______________________________________________________________________    Signed, Eleni Griffin, MD [facility]

## 2024-05-27 NOTE — Transfer of Care (Signed)
 Immediate Anesthesia Transfer of Care Note  Patient: Rita [name]  Procedure(s) Performed: RADIOLOGY WITH ANESTHESIA  Patient Location: ICU  Anesthesia Type:General  Level of Consciousness: Patient remains intubated per anesthesia plan  Airway & Oxygen Therapy: Patient remains intubated per anesthesia plan and Patient placed on Ventilator (see vital sign flow sheet for setting)  Post-op Assessment: Report given to RN and Post -op Vital signs reviewed and stable  Post vital signs: Reviewed and stable  Last Vitals:  Vitals Value Taken Time  BP 123/69 05/27/24 1909  Temp    Pulse 79 05/27/24 1918  Resp    SpO2 100 % 05/27/24 1918  Vitals shown include unfiled device data.  Last Pain:  Vitals:   05/27/24 1534  PainSc: 0-No pain         Complications: No notable events documented.

## 2024-05-27 NOTE — ED Provider Notes (Signed)
 Rita [name] EMERGENCY DEPARTMENT AT [facility] Provider Note   CSN: [id number] Arrival date & time: 05/27/24  1457     History  Chief Complaint  Patient presents with   Code Stroke    Rita [name] is a 58 y.o. female.  HPI Patient presents via EMS after sudden development of aphasia, right-sided weakness.  History is obtained by EMS, patient cannot provide all details.  Last seen normal time was 45 minutes prior to ED arrival 2 PM.  Patient noted of multiple medical issues, but was reportedly in her usual state of health when she suddenly developed aphasia, right-sided weakness.  EMS reports mild hypertension, 140/100 en route otherwise no hemodynamic instability.    Home Medications Prior to Admission medications   Medication Sig Start Date End Date Taking? Authorizing Provider  atorvastatin  (LIPITOR ) 80 MG tablet Take 1 tablet (80 mg total) by mouth daily at 6 PM. 04/01/23 05/31/23  Mason Sole, Pratik D, DO  Buprenorphine  HCl-Naloxone  HCl 8-2 MG FILM Take 1 Film by mouth 3 (three) times daily. 04/01/23   Mason Sole, Pratik D, DO  dexlansoprazole  (DEXILANT ) 60 MG capsule Take 1 capsule (60 mg total) by mouth daily. 04/01/23 05/01/23  Mason Sole, Pratik D, DO  ezetimibe  (ZETIA ) 10 MG tablet Take 1 tablet (10 mg total) by mouth daily. 04/01/23 05/01/23  Mason Sole, Pratik D, DO  potassium chloride  (MICRO-K ) 10 MEQ CR capsule Take 1 capsule (10 mEq total) by mouth 2 (two) times daily. 04/01/23 05/31/23  Doreene Gammon D, DO      Allergies    Patient has no known allergies.    Review of Systems   Review of Systems  Unable to perform ROS: Mental status change    Physical Exam Updated Vital Signs Wt 99.7 kg   LMP 02/09/2012   BMI 34.43 kg/m  Physical Exam Vitals and nursing note reviewed.  Constitutional:      Appearance: She is well-developed. She is ill-appearing.  HENT:     Head: Normocephalic and atraumatic.  Eyes:     Conjunctiva/sclera: Conjunctivae normal.  Cardiovascular:     Rate and Rhythm:  Normal rate and regular rhythm.  Pulmonary:     Effort: Pulmonary effort is normal. No respiratory distress.     Breath sounds: Normal breath sounds. No stridor.  Abdominal:     General: There is no distension.  Skin:    General: Skin is warm and dry.  Neurological:     Comments: Patient follows commands somewhat, lifting her left arm, trying to speak, does not follow them consistently.  Right arm flaccid, no facial asymmetry.  Psychiatric:        Mood and Affect: Mood normal.     ED Results / Procedures / Treatments   Labs (all labs ordered are listed, but only abnormal results are displayed) Labs Reviewed  CBC - Abnormal; Notable for the following components:      Result Value   WBC 11.4 (*)    Platelets 415 (*)    All other components within normal limits  COMPREHENSIVE METABOLIC PANEL WITH GFR - Abnormal; Notable for the following components:   Potassium 2.8 (*)    Glucose, Bld 115 (*)    Calcium  8.7 (*)    All other components within normal limits  CBG MONITORING, ED - Abnormal; Notable for the following components:   Glucose-Capillary 114 (*)    All other components within normal limits  PROTIME-INR  APTT  DIFFERENTIAL  ETHANOL  RAPID URINE DRUG SCREEN,  HOSP PERFORMED  I-STAT CHEM 8, ED    EKG None  Radiology CT HEAD CODE STROKE WO CONTRAST Result Date: 05/27/2024 CLINICAL DATA:  Code stroke. Neuro deficit, concern for stroke, right-sided weakness. EXAM: CT HEAD WITHOUT CONTRAST TECHNIQUE: Contiguous axial images were obtained from the base of the skull through the vertex without intravenous contrast. RADIATION DOSE REDUCTION: This exam was performed according to the departmental dose-optimization program which includes automated exposure control, adjustment of the mA and/or kV according to patient size and/or use of iterative reconstruction technique. COMPARISON:  CT head and CTA head and neck 03/31/2023. FINDINGS: Brain: No acute intracranial hemorrhage. Similar  appearance of encephalomalacia throughout the right cerebral hemisphere particularly in the frontoparietal lobes and superior left occipital lobe. Additional remote infarcts in the bilateral cerebellum. Nonspecific hypoattenuation in the periventricular and subcortical white matter favored to reflect chronic microvascular ischemic changes. No edema, mass effect, or midline shift. The basilar cisterns are patent. Ventricles: Slight ex vacuo dilatation of the right lateral ventricle. No hydrocephalus. Vascular: Atherosclerosis in the right carotid siphon. Redemonstrated stent in the left carotid siphon. Skull: No acute or aggressive finding. Orbits: Orbits are symmetric. Sinuses: Mucosal thickening in the ethmoid sinuses. Other: Mastoid air cells are clear. ASPECTS [facility] Stroke Program Early CT Score) - Ganglionic level infarction (caudate, lentiform nuclei, internal capsule, insula, M1-M3 cortex): 7 - Supraganglionic infarction (M4-M6 cortex): 3 Total score (0-10 with 10 being normal): 10 IMPRESSION: 1. No CT evidence of acute intracranial abnormality. 2. Similar remote infarcts in the left cerebral hemisphere and bilateral cerebellum. Chronic microvascular ischemic changes. 3. ASPECTS is 10 These results were called by telephone at the time of interpretation on 05/27/2024 at 3:22 pm to provider Dorenda Gandy , who verbally acknowledged these results. Electronically Signed   By: Denny Flack M.D.   On: 05/27/2024 15:22    Procedures Procedures    Medications Ordered in ED Medications  sodium chloride  0.9 % bolus 500 mL (has no administration in time range)    Followed by  0.9 %  sodium chloride  infusion (has no administration in time range)  iohexol  (OMNIPAQUE ) 350 MG/ML injection 75 mL (75 mLs Intravenous Contrast Given 05/27/24 1527)  tenecteplase (TNKASE) injection for Stroke 25 mg (25 mg Intravenous Given 05/27/24 1544)    ED Course/ Medical Decision Making/ A&P                                  Medical Decision Making Patient presents with concern for acute neurologic change.  Patient is hemodynamically unremarkable, chart review has history of vasculopathy, supposed to be on Brilinta , aspirin , unclear if she is consistent with complaints.  With new focal neurodeficits patient was designated code stroke in the field, and I discussed her case with our neurology colleagues in her evaluation.  Patient's head CT without acute hemorrhage, family not reachable via telephone, and after two-physician consent patient received TNK, transferred emergently to our interventional radiology suite at our advanced center after discussion with neurology again. Labs noncontributory.  Cardiac 70 sinus normal pulse ox 100% room air normal  Amount and/or Complexity of Data Reviewed Independent Historian: EMS External Data Reviewed: notes. Labs: ordered. Decision-making details documented in ED Course. Radiology: ordered and independent interpretation performed. Decision-making details documented in ED Course. ECG/medicine tests: ordered and independent interpretation performed. Decision-making details documented in ED Course.  Risk Prescription drug management.  Update: patient en route to [facility]  Final Clinical Impression(s) / ED Diagnoses Final diagnoses:  Acute CVA (cerebrovascular accident) (HCC)   CRITICAL CARE Performed by: Dorenda Gandy Total critical care time: 35 minutes Critical care time was exclusive of separately billable procedures and treating other patients. Critical care was necessary to treat or prevent imminent or life-threatening deterioration. Critical care was time spent personally by me on the following activities: development of treatment plan with patient and/or surrogate as well as nursing, discussions with consultants, evaluation of patient's response to treatment, examination of patient, obtaining history from patient or surrogate, ordering and performing  treatments and interventions, ordering and review of laboratory studies, ordering and review of radiographic studies, pulse oximetry and re-evaluation of patient's condition.    Dorenda Gandy, MD 05/27/24 [id number]

## 2024-05-27 NOTE — Anesthesia Procedure Notes (Signed)
 Procedure Name: Intubation Date/Time: 05/27/2024 4:36 PM  Performed by: Candance Certain, CRNAPre-anesthesia Checklist: Patient identified, Emergency Drugs available, Suction available and Patient being monitored Patient Re-evaluated:Patient Re-evaluated prior to induction Oxygen Delivery Method: Circle System Utilized Preoxygenation: Pre-oxygenation with 100% oxygen Induction Type: IV induction Laryngoscope Size: Glidescope and 3 Grade View: Grade I Tube type: Oral Number of attempts: 1 Airway Equipment and Method: Stylet and Oral airway Placement Confirmation: ETT inserted through vocal cords under direct vision, positive ETCO2 and breath sounds checked- equal and bilateral Secured at: 21 cm Tube secured with: Tape Dental Injury: Teeth and Oropharynx as per pre-operative assessment  Comments: Atraumatic induction/intubation. Patient edentulous. Oral mucosa as per preop.

## 2024-05-27 NOTE — Anesthesia Preprocedure Evaluation (Addendum)
 Anesthesia Evaluation  Patient identified by MRN, date of birth, ID band Patient confused    Reviewed: Allergy & Precautions, H&P , Patient's Chart, lab work & pertinent test results, Unable to perform ROS - Chart review onlyPreop documentation limited or incomplete due to emergent nature of procedure.  Airway Mallampati: II  TM Distance: >3 FB Neck ROM: Full    Dental no notable dental hx. (+) Edentulous Upper, Edentulous Lower   Pulmonary COPD, Current Smoker and Patient abstained from smoking.   Pulmonary exam normal breath sounds clear to auscultation       Cardiovascular Exercise Tolerance: Good hypertension, Pt. on medications + Peripheral Vascular Disease  Normal cardiovascular exam+ Valvular Problems/Murmurs AS  Rhythm:Regular Rate:Normal  Echo 03/2023  1. Left ventricular ejection fraction, by estimation, is 60 to 65%. The left ventricle has normal function. The left ventricle has no regional wall motion abnormalities. There is mild left ventricular hypertrophy. Left ventricular diastolic parameters were normal.   2. Right ventricular systolic function is normal. The right ventricular size is normal. There is normal pulmonary artery systolic pressure.   3. Left atrial size was mildly dilated.   4. The mitral valve is grossly normal. No evidence of mitral valve regurgitation. No evidence of mitral stenosis.   5. The aortic valve has an indeterminant number of cusps. Aortic valve regurgitation is not visualized. Mild aortic valve stenosis. Aortic valve mean gradient measures 9.0 mmHg. Aortic valve Vmax measures 2.20 m/s.   6. The inferior vena cava is normal in size with greater than 50% respiratory variability, suggesting right atrial pressure of 3 mmHg.   7. Increased flow velocities may be secondary to anemia, thyrotoxicosis, hyperdynamic or high flow state.   Comparison(s): No significant change from prior study.     ECHO  9/23 1. Left ventricular ejection fraction, by estimation, is 60 to 65%. The  left ventricle has normal function. The left ventricle has no regional  wall motion abnormalities. There is mild left ventricular hypertrophy.  Left ventricular diastolic parameters  are consistent with Grade I diastolic dysfunction (impaired relaxation).    Neuro/Psych  PSYCHIATRIC DISORDERS  Depression    Improving weakness R side CVA, Residual Symptoms    GI/Hepatic Neg liver ROS, hiatal hernia, PUD,neg GERD  ,,  Endo/Other  negative endocrine ROS    Renal/GU negative Renal ROS     Musculoskeletal  (+) Arthritis , Osteoarthritis,    Abdominal   Peds  Hematology  (+) Blood dyscrasia, anemia   Anesthesia Other Findings   Reproductive/Obstetrics                             Anesthesia Physical Anesthesia Plan  ASA: 4 and emergent  Anesthesia Plan: General   Post-op Pain Management: Minimal or no pain anticipated   Induction: Intravenous, Rapid sequence and Cricoid pressure planned  PONV Risk Score and Plan: 2 and Ondansetron  and Dexamethasone   Airway Management Planned: Oral ETT  Additional Equipment: Arterial line  Intra-op Plan:   Post-operative Plan: Extubation in OR  Informed Consent: I have reviewed the patients History and Physical, chart, labs and discussed the procedure including the risks, benefits and alternatives for the proposed anesthesia with the patient or authorized representative who has indicated his/her understanding and acceptance.     Dental advisory given  Plan Discussed with: CRNA  Anesthesia Plan Comments: (HPI: Rita [name] is a 58 y.o. female with medical history significant of hyperlipidemia, Plavix , history  of left occipital CVA without residual deficits (2020), tobacco abuse who presented to the emergency department due to right-sided weakness and slurred speech which started yesterday.  She states that she was in her normal  state of health when she went to bed on Tuesday evening and on walking up yesterday (Wednesday) morning, she noted right-sided weakness which was worse in right arm.  Symptoms worsened as the day progressed with worsening slurred speech, weakness and numbness.  She states that she has been compliant with her statin and Plavix , though she endorsed missing some doses occasionally.  Patient continues to smoke 3 to 4 cigarettes daily.  She denies headache, chest pain, shortness of breath, nausea, vomiting, abdominal pain   ED Course:  In the emergency department, she was intermittently bradycardic, BP was 119/80 and other vital signs were within normal range.  Work-up in the ED shows hypokalemia, hyperglycemia, alcohol  level was less than 10. CT head without contrast showed no acute intracranial abnormality MRI head without and with contrast showed: 1. Multiple acute infarcts in the high left frontal and parietal lobes, potentially watershed. 2. Motion limited MRA without definite evidence of emergent proximal large vessel occlusion. 3. Particularly limited evaluation of the PCAs with nondiagnostic evaluation distal to the mid P2 PCAs bilaterally. A CTA could further characterize if clinically warranted. Potassium was replenished.  Neurologist at [facility] was consulted and recommended the patient can stay at [facility] and to consult with [facility] neurology in the morning per ED PA. )        Anesthesia Quick Evaluation

## 2024-05-27 NOTE — Progress Notes (Addendum)
 eLink Physician-Brief Progress Note Patient Name: Rita [name] DOB: 09-05-1966 MRN: [id number]   Date of Service  05/27/2024  HPI/Events of Note  58 year old with a history of carotid subclavian bypass graft, chronic opiate use, and ongoing smoking that presents via EMS for right-sided weakness that to be having a stroke secondary to left-sided carotid occlusion.  Status post thrombolytics and IR guided thrombectomy.  Patient evaluated upon arrival from neuro IR.  Vital signs are within normal limits.  Saturating 100% on the ventilator.  On propofol  and intermittent fentanyl  pushes.  Results show hypokalemia, leukocytosis and radiographs confirm appropriate positioning of tubes and lines.  CT with angiography reviewed.  eICU Interventions  Bowel, add fentanyl  infusion, restraints The patient was reaching for the tube  Plan team evaluation pending, wait to initiate sedation until they can evaluate the patient.  Standard thrombolytic precautions.  Further neurological checks.  PMNR evaluations, stroke risk stratification workup  Daily spontaneous awakening/breathing trials, maintain mechanical ventilation for now  DVT prophylaxis with SCDs in the setting of TNK GI prophylaxis with pantoprazole    2132 - Switch fentanyl  from IV push to bolus from drip.  Will reach out to neuro IR about ongoing bleeding from sheath site  Intervention Category Evaluation Type: New Patient Evaluation  Shi Blankenship 05/27/2024, 7:48 PM

## 2024-05-27 NOTE — ED Notes (Signed)
 Report given to [facility] with IR

## 2024-05-27 NOTE — Anesthesia Procedure Notes (Signed)
 Arterial Line Insertion Start/End6/01/2024 4:30 PM, 05/27/2024 4:42 PM Performed by: Gorman Laughter, MD, Bernell Brigham, CRNA, anesthesiologist  Patient location: OR. Emergency situation Patient sedated Left, radial was placed Catheter size: 20 G Hand hygiene performed , maximum sterile barriers used  and Seldinger technique used Allen's test indicative of satisfactory collateral circulation Attempts: 2 Procedure performed using ultrasound guided technique. Ultrasound Notes:anatomy identified, needle tip was noted to be adjacent to the nerve/plexus identified and no ultrasound evidence of intravascular and/or intraneural injection Following insertion, Biopatch. Patient tolerated the procedure well with no immediate complications. Additional procedure comments: Attempt by crna X1; unsuccessful. Attempt by MDA with US  x1; successful .

## 2024-05-27 NOTE — Procedures (Signed)
 INR   Status post left common carotid arteriogram.  Right CFA approach.  Findings.  Occluded left internal carotid artery at the level of the previously positioned flow diverter stent.  Status post complete revascularization of the occluded distal left internal carotid artery supraclinoid segment, and left M1 segment, with 3 passes of contact  aspiration achieving a TICI  3 revascularization.  Post CT of the brain and demonstrates no evidence of hemorrhagic complications.  8 French Angio-Seal closure device deployed for right femoral arterial puncture site.  Left common femoral vein access site left in situ for infusion of   IV cangrelor until 10:05 PM.  Distal pulses all present bilaterally unchanged.  Medications given.  Aspirin  81 mg and Brilinta  90 mg via orogastric tube at the  start of the procedure.  Half bolus dose of cangrelor followed by 4-hour half dose infusion started at 6.05 pm ,followed by CT of the brain without infusion.  Patient left intubated due to patient being unresponsive prior to the procedure.  Jory Ng MD.

## 2024-05-27 NOTE — ED Notes (Signed)
 Pts daughter Landa Pine given an update

## 2024-05-27 NOTE — ED Notes (Signed)
 Pt in CT.

## 2024-05-27 NOTE — Progress Notes (Signed)
 RT and RN took pt via ventilator to CT and back. There we no apparent complications.

## 2024-05-28 ENCOUNTER — Inpatient Hospital Stay (HOSPITAL_COMMUNITY)

## 2024-05-28 ENCOUNTER — Encounter (HOSPITAL_COMMUNITY): Payer: Self-pay | Admitting: Radiology

## 2024-05-28 DIAGNOSIS — I10 Essential (primary) hypertension: Secondary | ICD-10-CM

## 2024-05-28 DIAGNOSIS — E119 Type 2 diabetes mellitus without complications: Secondary | ICD-10-CM

## 2024-05-28 DIAGNOSIS — I69391 Dysphagia following cerebral infarction: Secondary | ICD-10-CM | POA: Diagnosis not present

## 2024-05-28 DIAGNOSIS — Z720 Tobacco use: Secondary | ICD-10-CM

## 2024-05-28 DIAGNOSIS — I6522 Occlusion and stenosis of left carotid artery: Secondary | ICD-10-CM

## 2024-05-28 DIAGNOSIS — I6389 Other cerebral infarction: Secondary | ICD-10-CM | POA: Diagnosis not present

## 2024-05-28 DIAGNOSIS — I63512 Cerebral infarction due to unspecified occlusion or stenosis of left middle cerebral artery: Principal | ICD-10-CM

## 2024-05-28 DIAGNOSIS — Z9889 Other specified postprocedural states: Secondary | ICD-10-CM

## 2024-05-28 DIAGNOSIS — E785 Hyperlipidemia, unspecified: Secondary | ICD-10-CM

## 2024-05-28 DIAGNOSIS — F1210 Cannabis abuse, uncomplicated: Secondary | ICD-10-CM

## 2024-05-28 DIAGNOSIS — F17210 Nicotine dependence, cigarettes, uncomplicated: Secondary | ICD-10-CM

## 2024-05-28 DIAGNOSIS — Z95828 Presence of other vascular implants and grafts: Secondary | ICD-10-CM

## 2024-05-28 DIAGNOSIS — J9690 Respiratory failure, unspecified, unspecified whether with hypoxia or hypercapnia: Secondary | ICD-10-CM

## 2024-05-28 DIAGNOSIS — F1410 Cocaine abuse, uncomplicated: Secondary | ICD-10-CM

## 2024-05-28 HISTORY — PX: IR PATIENT EVAL TECH 0-60 MINS: IMG5564

## 2024-05-28 LAB — LIPID PANEL
Cholesterol: 220 mg/dL — ABNORMAL HIGH (ref 0–200)
HDL: 33 mg/dL — ABNORMAL LOW (ref >40)
LDL Cholesterol: UNDETERMINED mg/dL (ref 0–99)
Total CHOL/HDL Ratio: 6.7 ratio
Triglycerides: 911 mg/dL — ABNORMAL HIGH (ref <150)
VLDL: UNDETERMINED mg/dL (ref 0–40)

## 2024-05-28 LAB — BASIC METABOLIC PANEL WITH GFR
Anion gap: 10 (ref 5–15)
BUN: 11 mg/dL (ref 6–20)
CO2: 23 mmol/L (ref 22–32)
Calcium: 8.4 mg/dL — ABNORMAL LOW (ref 8.9–10.3)
Chloride: 104 mmol/L (ref 98–111)
Creatinine, Ser: 0.75 mg/dL (ref 0.44–1.00)
GFR, Estimated: >60 mL/min (ref >60)
Glucose, Bld: 199 mg/dL — ABNORMAL HIGH (ref 70–99)
Potassium: 3.2 mmol/L — ABNORMAL LOW (ref 3.5–5.1)
Sodium: 137 mmol/L (ref 135–145)

## 2024-05-28 LAB — CBC WITH DIFFERENTIAL/PLATELET
Abs Immature Granulocytes: 0.13 10*3/uL — ABNORMAL HIGH (ref 0.00–0.07)
Basophils Absolute: 0 10*3/uL (ref 0.0–0.1)
Basophils Relative: 0 %
Eosinophils Absolute: 0 10*3/uL (ref 0.0–0.5)
Eosinophils Relative: 0 %
HCT: 37.3 % (ref 36.0–46.0)
Hemoglobin: 13.3 g/dL (ref 12.0–15.0)
Immature Granulocytes: 1 %
Lymphocytes Relative: 5 %
Lymphs Abs: 0.9 10*3/uL (ref 0.7–4.0)
MCH: 31.6 pg (ref 26.0–34.0)
MCHC: 35.7 g/dL (ref 30.0–36.0)
MCV: 88.6 fL (ref 80.0–100.0)
Monocytes Absolute: 0.6 10*3/uL (ref 0.1–1.0)
Monocytes Relative: 4 %
Neutro Abs: 15 10*3/uL — ABNORMAL HIGH (ref 1.7–7.7)
Neutrophils Relative %: 90 %
Platelets: 454 10*3/uL — ABNORMAL HIGH (ref 150–400)
RBC: 4.21 MIL/uL (ref 3.87–5.11)
RDW: 14 % (ref 11.5–15.5)
WBC: 16.7 10*3/uL — ABNORMAL HIGH (ref 4.0–10.5)
nRBC: 0 % (ref 0.0–0.2)

## 2024-05-28 LAB — GLUCOSE, CAPILLARY
Glucose-Capillary: 138 mg/dL — ABNORMAL HIGH (ref 70–99)
Glucose-Capillary: 139 mg/dL — ABNORMAL HIGH (ref 70–99)
Glucose-Capillary: 160 mg/dL — ABNORMAL HIGH (ref 70–99)
Glucose-Capillary: 175 mg/dL — ABNORMAL HIGH (ref 70–99)
Glucose-Capillary: 177 mg/dL — ABNORMAL HIGH (ref 70–99)

## 2024-05-28 LAB — PHOSPHORUS: Phosphorus: 3.8 mg/dL (ref 2.5–4.6)

## 2024-05-28 LAB — ECHOCARDIOGRAM COMPLETE BUBBLE STUDY
AR max vel: 2.36 cm2
AV Area VTI: 2.42 cm2
AV Area mean vel: 2.39 cm2
AV Mean grad: 15.7 mmHg
AV Peak grad: 30.5 mmHg
Ao pk vel: 2.76 m/s
Area-P 1/2: 3.31 cm2
S' Lateral: 3 cm

## 2024-05-28 LAB — MAGNESIUM: Magnesium: 1.7 mg/dL (ref 1.7–2.4)

## 2024-05-28 LAB — TRIGLYCERIDES
Triglycerides: 863 mg/dL — ABNORMAL HIGH (ref <150)
Triglycerides: 781 mg/dL — ABNORMAL HIGH (ref <150)

## 2024-05-28 LAB — LDL CHOLESTEROL, DIRECT: Direct LDL: 115 mg/dL — ABNORMAL HIGH (ref 0–99)

## 2024-05-28 MED ORDER — INSULIN ASPART 100 UNIT/ML IJ SOLN
0.0000 [IU] | INTRAMUSCULAR | Status: DC
  Administered 2024-05-28: 1 [IU] via SUBCUTANEOUS
  Administered 2024-05-28 (×2): 2 [IU] via SUBCUTANEOUS
  Administered 2024-05-28: 1 [IU] via SUBCUTANEOUS
  Administered 2024-05-29: 2 [IU] via SUBCUTANEOUS
  Administered 2024-05-29 (×2): 1 [IU] via SUBCUTANEOUS
  Administered 2024-05-29 – 2024-05-30 (×2): 2 [IU] via SUBCUTANEOUS
  Administered 2024-05-30 (×2): 1 [IU] via SUBCUTANEOUS
  Administered 2024-05-30 (×3): 2 [IU] via SUBCUTANEOUS
  Administered 2024-05-31 (×2): 1 [IU] via SUBCUTANEOUS
  Administered 2024-05-31 – 2024-06-01 (×6): 2 [IU] via SUBCUTANEOUS
  Administered 2024-06-01: 3 [IU] via SUBCUTANEOUS
  Administered 2024-06-01 – 2024-06-02 (×2): 2 [IU] via SUBCUTANEOUS
  Administered 2024-06-02: 1 [IU] via SUBCUTANEOUS
  Administered 2024-06-02 – 2024-06-03 (×2): 2 [IU] via SUBCUTANEOUS
  Administered 2024-06-03 (×2): 1 [IU] via SUBCUTANEOUS
  Administered 2024-06-03: 3 [IU] via SUBCUTANEOUS
  Administered 2024-06-03 – 2024-06-04 (×3): 2 [IU] via SUBCUTANEOUS
  Administered 2024-06-04: 1 [IU] via SUBCUTANEOUS
  Administered 2024-06-04 (×3): 2 [IU] via SUBCUTANEOUS
  Administered 2024-06-05: 1 [IU] via SUBCUTANEOUS
  Administered 2024-06-05: 2 [IU] via SUBCUTANEOUS

## 2024-05-28 MED ORDER — NICOTINE 14 MG/24HR TD PT24
14.0000 mg | MEDICATED_PATCH | Freq: Every day | TRANSDERMAL | Status: DC
  Administered 2024-05-28 – 2024-06-05 (×9): 14 mg via TRANSDERMAL
  Filled 2024-05-28 (×9): qty 1

## 2024-05-28 MED ORDER — BUPRENORPHINE HCL 8 MG SL SUBL
8.0000 mg | SUBLINGUAL_TABLET | Freq: Three times a day (TID) | SUBLINGUAL | Status: DC
  Administered 2024-05-28 – 2024-06-05 (×23): 8 mg via SUBLINGUAL
  Filled 2024-05-28 (×2): qty 1
  Filled 2024-05-28: qty 4
  Filled 2024-05-28 (×3): qty 1
  Filled 2024-05-28: qty 4
  Filled 2024-05-28: qty 1
  Filled 2024-05-28: qty 4
  Filled 2024-05-28: qty 1
  Filled 2024-05-28: qty 4
  Filled 2024-05-28 (×6): qty 1
  Filled 2024-05-28: qty 4
  Filled 2024-05-28: qty 1
  Filled 2024-05-28: qty 4
  Filled 2024-05-28 (×3): qty 1

## 2024-05-28 MED ORDER — PROSOURCE TF20 ENFIT COMPATIBL EN LIQD
60.0000 mL | Freq: Every day | ENTERAL | Status: DC
Start: 2024-05-28 — End: 2024-05-31
  Administered 2024-05-28 – 2024-05-31 (×4): 60 mL
  Filled 2024-05-28 (×4): qty 60

## 2024-05-28 MED ORDER — FENTANYL BOLUS VIA INFUSION
25.0000 ug | INTRAVENOUS | Status: DC | PRN
  Administered 2024-05-28 – 2024-05-29 (×2): 100 ug via INTRAVENOUS

## 2024-05-28 MED ORDER — IPRATROPIUM-ALBUTEROL 0.5-2.5 (3) MG/3ML IN SOLN: 3.0000 mL | RESPIRATORY_TRACT | Status: DC | PRN

## 2024-05-28 MED ORDER — OSMOLITE 1.5 CAL PO LIQD
1000.0000 mL | ORAL | Status: DC
  Administered 2024-05-28 – 2024-05-31 (×4): 1000 mL

## 2024-05-28 MED ORDER — HEPARIN SODIUM (PORCINE) 5000 UNIT/ML IJ SOLN
5000.0000 [IU] | Freq: Three times a day (TID) | INTRAMUSCULAR | Status: DC
  Administered 2024-05-28 – 2024-05-29 (×2): 5000 [IU] via SUBCUTANEOUS
  Filled 2024-05-28 (×2): qty 1

## 2024-05-28 MED ORDER — POTASSIUM CHLORIDE 20 MEQ PO PACK
40.0000 meq | PACK | ORAL | Status: AC
  Administered 2024-05-28 (×2): 40 meq
  Filled 2024-05-28 (×2): qty 2

## 2024-05-28 MED ORDER — IOHEXOL 350 MG/ML SOLN
75.0000 mL | Freq: Once | INTRAVENOUS | Status: AC | PRN
Start: 2024-05-28 — End: 2024-05-28
  Administered 2024-05-28: 75 mL via INTRAVENOUS

## 2024-05-28 MED ORDER — CHLORHEXIDINE GLUCONATE CLOTH 2 % EX PADS
6.0000 | MEDICATED_PAD | Freq: Every day | CUTANEOUS | Status: DC
  Administered 2024-05-29 – 2024-06-02 (×8): 6 via TOPICAL

## 2024-05-28 MED ORDER — AMLODIPINE BESYLATE 5 MG PO TABS
10.0000 mg | ORAL_TABLET | Freq: Every day | ORAL | Status: DC
  Administered 2024-05-28 – 2024-05-30 (×3): 10 mg
  Filled 2024-05-28 (×3): qty 1

## 2024-05-28 MED ORDER — NICARDIPINE HCL IN NACL 20-0.86 MG/200ML-% IV SOLN
3.0000 mg/h | INTRAVENOUS | Status: DC
  Administered 2024-05-28: 7.5 mg/h via INTRAVENOUS
  Administered 2024-05-28: 5 mg/h via INTRAVENOUS
  Filled 2024-05-28: qty 200
  Filled 2024-05-28: qty 400

## 2024-05-28 NOTE — Progress Notes (Signed)
 R femoral venous sheath d/c'ed by IR techs at 1000. Deveshwar MD gave verbal order to this RN to d/c sheath. Will keep HOB flat x6h and continue vascular checks per protocol.  - Ar[place]us Knoll, RN

## 2024-05-28 NOTE — Progress Notes (Signed)
 Chief Complaint: Patient was seen today for post CVA intervention   Supervising Physician: Luellen Sages  Patient Status: [facility] - In-pt  Subjective: Occluded left Intracranial carotid at the level of previously positioned flow diverting stent. S/p complete revascularization of the occluded distal left internal carotid artery supraclinoid segment, and left M1 segment, with 3 passes of contact  aspiration achieving a TICI 3 revascularization  (R)CFA access, no issues overnight per RN (L)CFV sheath left intact for access.  NO family at bedside.  Pt remains on Fentanyl , propofol , and cleviprex  drips. RN reports some spontaneous movement on left side, minimal on right.  Objective: Physical Exam: BP 124/69   Pulse 90   Temp 98.6 F (37 C) (Axillary)   Resp (!) 24   Wt 224 lb 13.9 oz (102 kg)   LMP 02/09/2012   SpO2 99%   BMI 35.22 kg/m  Opens eyes to name but not following any commands. No mvmt of ext seen. Ext: (R)groin soft, NT, no hematoma (L)groin soft, sheath intact.   Current Facility-Administered Medications:     stroke: early stages of recovery book, , Does not apply, Once, Eleni Griffin, MD   [facility] sodium chloride  0.9 % bolus 500 mL, 500 mL, Intravenous, Once, Last Rate: 500 mL/hr at 05/27/24 1555, 500 mL at 05/27/24 1555 **FOLLOWED BY** 0.9 %  sodium chloride  infusion, 100 mL/hr, Intravenous, Continuous, Dorenda Gandy, MD, Stopped at 05/27/24 1917   0.9 %  sodium chloride  infusion, , Intravenous, Continuous, Desai, Rahul P, PA-C, Last Rate: 50 mL/hr at 05/28/24 0800, Infusion Verify at 05/28/24 0800   acetaminophen  (TYLENOL ) tablet 650 mg, 650 mg, Oral, Q4H PRN **OR** acetaminophen  (TYLENOL ) 160 MG/5ML solution 650 mg, 650 mg, Per Tube, Q4H PRN **OR** acetaminophen  (TYLENOL ) suppository 650 mg, 650 mg, Rectal, Q4H PRN, Eleni Griffin, MD   aspirin  chewable tablet 81 mg, 81 mg, Oral, Daily **OR** aspirin  chewable tablet 81 mg, 81 mg, Per Tube,  Daily, Deveshwar, Sanjeev, MD   atorvastatin  (LIPITOR ) tablet 80 mg, 80 mg, Per Tube, q1800, Desai, Rahul P, PA-C   Chlorhexidine  Gluconate Cloth 2 % PADS 6 each, 6 each, Topical, Daily, Eleni Griffin, MD, 6 each at 05/27/24 2000   docusate (COLACE) 50 MG/5ML liquid 100 mg, 100 mg, Per Tube, BID, Alayne Hubert, Stephanie M, PA-C, 100 mg at 05/27/24 2307   ezetimibe  (ZETIA ) tablet 10 mg, 10 mg, Per Tube, Daily, Desai, Rahul P, PA-C   fentaNYL  (SUBLIMAZE ) bolus via infusion 25-100 mcg, 25-100 mcg, Intravenous, Q15 min PRN, Alayne Hubert, Stephanie M, PA-C   fentaNYL  in NS (91mcg/ml) infusion-PREMIX, 0-400 mcg/hr, Intravenous, Continuous, Paliwal, Aditya, MD, Last Rate: 10 mL/hr at 05/28/24 0800, 100 mcg/hr at 05/28/24 0800   ipratropium-albuterol  (DUONEB) 0.5-2.5 (3) MG/3ML nebulizer solution 3 mL, 3 mL, Nebulization, Q4H PRN, Lezlie Reddish, DO   midazolam  (VERSED ) injection 1-2 mg, 1-2 mg, Intravenous, Q1H PRN, Desai, Rahul P, PA-C   nicardipine (CARDENE) 20mg  in 0.86% saline 200ml IV infusion (0.1 mg/ml), 3-15 mg/hr, Intravenous, Continuous, Alayne Hubert, Stephanie M, PA-C   Oral care mouth rinse, 15 mL, Mouth Rinse, Q2H, Reese, Stephanie M, PA-C, 15 mL at 05/28/24 7829   Oral care mouth rinse, 15 mL, Mouth Rinse, PRN, Shelli Dexter M, PA-C   oxidized cellulose (Surgicel) pad 1 each, 1 each, Topical, Q4H PRN, Paliwal, Aditya, MD   pantoprazole  (PROTONIX ) injection 40 mg, 40 mg, Intravenous, QHS, Eleni Griffin, MD, 40 mg at 05/27/24 2306   polyethylene glycol (MIRALAX  / GLYCOLAX ) packet  17 g, 17 g, Per Tube, Daily, Shelli Dexter M, PA-C, 17 g at 05/27/24 2307   propofol  (DIPRIVAN ) 1000 MG/100ML infusion, 0-80 mcg/kg/min, Intravenous, Continuous, Shelli Dexter M, [place], Last Rate: 29.9 mL/hr at 05/28/24 0800, 50 mcg/kg/min at 05/28/24 0800   sodium chloride  flush (NS) 0.9 % injection 10-40 mL, 10-40 mL, Intracatheter, Q12H, Eleni Griffin, MD   sodium chloride  flush (NS) 0.9 % injection  10-40 mL, 10-40 mL, Intracatheter, PRN, Eleni Griffin, MD   ticagrelor  (BRILINTA ) tablet 90 mg, 90 mg, Oral, BID **OR** ticagrelor  (BRILINTA ) tablet 90 mg, 90 mg, Per Tube, BID, Luellen Sages, MD  Labs: CBC Recent Labs    05/27/24 1508 05/27/24 2135 05/28/24 0548  WBC 11.4*  --  16.7*  HGB 13.9 12.9 13.3  HCT 39.7 38.0 37.3  PLT 415*  --  454*   BMET Recent Labs    05/27/24 1508 05/27/24 2135 05/28/24 0548  NA 139 140 137  K 2.8* 3.3* 3.2*  CL 103  --  104  CO2 23  --  23  GLUCOSE 115*  --  199*  BUN 14  --  11  CREATININE 0.76  --  0.75  CALCIUM  8.7*  --  8.4*   LFT Recent Labs    05/27/24 1508  PROT 7.4  ALBUMIN 4.0  AST 21  ALT 20  ALKPHOS 73  BILITOT 0.2   PT/INR Recent Labs    05/27/24 1508  LABPROT 12.4  INR 0.9     Studies/Results: Portable Chest xray Result Date: 05/28/2024 CLINICAL DATA:  Status post ET tube placement. EXAM: PORTABLE CHEST 1 VIEW COMPARISON:  05/27/2024 FINDINGS: The endotracheal tube tip is 5.5 cm above the carina. Enteric tube courses below the GE junction. Stable cardiomediastinal contours. Mild subsegmental atelectasis in the left midlung and medial right lung base. No significant pleural effusion, interstitial edema or airspace disease. No pneumothorax identified. Visualized osseous structures appear intact. IMPRESSION: 1. Satisfactory position of endotracheal tube. 2. Mild subsegmental atelectasis in the left midlung and medial right lung base. Electronically Signed   By: Kimberley Penman M.D.   On: 05/28/2024 06:08   CT HEAD WO CONTRAST ( ) Result Date: 05/28/2024 EXAM: CT HEAD WITHOUT 05/28/2024 05:25:22 AM TECHNIQUE: CT of the head was performed without the administration of intravenous contrast. Automated exposure control, iterative reconstruction, and/or weight based adjustment of the mA/kV was utilized to reduce the radiation dose to as low as reasonably achievable. COMPARISON: CT head without contrast 05/27/2024. CT  head without contrast 05/19/2023. CLINICAL HISTORY: Status post revascularization of occluded left ICA. Stroke, follow up. FINDINGS: BRAIN AND VENTRICLES: Remote left occipital and posterior watershed infarcts are stable. Remote cerebellar infarcts are again noted, more prominent on the right. There is no acute intracranial hemorrhage, mass effect or midline shift. No abnormal extra-axial fluid collection. The gray-white differentiation is maintained without an acute infarct. There is no hydrocephalus. ORBITS: The visualized portion of the orbits demonstrate no acute abnormality. SINUSES: Mild mucosal thickening is present in the sphenoid sinuses bilaterally. No fluid levels are present. Mild mucosal thickening is present in the anterior ethmoid air cells and inferior frontal sinuses. The mastoid air cells are clear. SOFT TISSUES AND SKULL: The patient is intubated. OG tube is in place. No acute abnormality of the visualized skull or soft tissues. IMPRESSION: 1. Stable remote left occipital and posterior watershed infarcts. 2. Stable remote cerebellar infarcts, more prominent on the right. Electronically signed by: Audree Leas MD 05/28/2024 05:47 AM EDT RP  Workstation: [id number]   CT ANGIO HEAD NECK W WO CM Result Date: 05/28/2024 EXAM: CTA HEAD AND NECK WITH AND WITHOUT 05/28/2024 05:25:22 AM TECHNIQUE: CTA of the head and neck was performed with and without the administration of intravenous contrast. Multiplanar 2D and/or 3D reformatted images are provided for review. Automated exposure control, iterative reconstruction, and/or weight based adjustment of the mA/kV was utilized to reduce the radiation dose to as low as reasonably achievable. Stenosis of the internal carotid arteries measured using NASCET criteria. COMPARISON: CT head without contrast 05/28/2024 at 05:19 am and CT angio head and neck 05/27/2024 CLINICAL HISTORY: Neuro deficit, acute, stroke suspected. Status post revascularization of  occluded flow diverter stent in the left internal carotid artery. Revascularization of left M1 segment. FINDINGS: CTA NECK: AORTIC ARCH AND ARCH VESSELS: Mild atherosclerotic calcifications are again noted at the aortic arch. The great vessel origins are widely patent. CAROTID ARTERIES: Atherosclerotic changes are present at the right carotid bifurcation without significant stenosis. Mild mural plaque is present in the left common carotid artery and bifurcation without significant stenosis or change. The left internal carotid artery is revascularized. VERTEBRAL ARTERIES: The proximal left vertebral artery is occluded. The vessel is reconstituted in the distal left v2 segment. High-grade stenosis and occlusion of the right vertebral artery at the v3 segment is noted. The left vertebral artery is occluded just below the dural margin, stable. The distal vertebral arteries are reconstituted. SOFT TISSUES: The lung apices are clear. No cervical or superior mediastinal lymphadenopathy. The larynx and pharynx are unremarkable. No acute abnormality of the salivary and thyroid  glands. BONES: No acute osseous abnormality. CTA HEAD: ANTERIOR CIRCULATION: Atherosclerotic calcifications are again noted in the supraclinoid right ICA without significant stenosis through the ICA terminus. The left cavernous ICA stent is patent. The A1 and M1 segments are patent. Mild proximal left M1 segment narrowing is present. The ACA and MCA branch vessels opacify normally on both sides. POSTERIOR CIRCULATION: The basilar artery is small. Prominent iliac vessels are again noted. The superior cerebellar arteries are patent bilaterally, with posterior cerebral arteries originating from the basilar tip. Moderate attenuation of distal PCA branch vessels is present bilaterally, worse on the right. OTHER: No dural venous sinus thrombosis on this non-dedicated study. BRAIN: There is no acute infarct or acute intracranial hemorrhage. No mass effect or  midline shift. IMPRESSION: 1. Status post revascularization of occluded flow diverter stent in the left internal carotid artery and revascularization of left M1 segment. Mild proximal left M1 segment narrowing. 2. High-grade stenosis and occlusion of the right vertebral artery at the V3 segment, stable. 3. Mild mural plaque in the left common carotid artery and bifurcation without significant stenosis or change. 4. Atherosclerotic changes at the right carotid bifurcation without significant stenosis. 5. Occlusion of the left vertebral artery just below the dural margin, stable. 6. Moderate attenuation of distal PCA branch vessels bilaterally, worse on the right. Electronically signed by: Audree Leas MD 05/28/2024 05:44 AM EDT RP Workstation: [id number]   CT HEAD POST STROKE FOLLOWUP/TIMED/STAT READ Result Date: 05/27/2024 CLINICAL DATA:  Code stroke.  Neuro deficit, acute, stroke suspected EXAM: CT HEAD WITHOUT CONTRAST TECHNIQUE: Contiguous axial images were obtained from the base of the skull through the vertex without intravenous contrast. RADIATION DOSE REDUCTION: This exam was performed according to the departmental dose-optimization program which includes automated exposure control, adjustment of the mA and/or kV according to patient size and/or use of iterative reconstruction technique. COMPARISON:  CT head May 27, 2024. FINDINGS:  Brain: Similar remote infarcts in the left cerebral hemisphere and cerebellum. No evidence of acute large vascular territory infarct, acute hemorrhage, mass lesion, midline shift or hydrocephalus. Vascular: Calcific atherosclerosis. Skull: No acute fracture. Sinuses/Orbits: Paranasal sinus mucosal thickening. No acute orbital findings. ASPECTS [facility] Stroke Program Early CT Score) Total score (0-10 with 10 being normal): 10. IMPRESSION: 1. No evidence of acute intracranial abnormality.  ASPECTS is 10. 2. Similar remote infarcts. Electronically Signed   By: Stevenson Elbe M.D.   On: 05/27/2024 23:04   DG Abd 1 View Result Date: 05/27/2024 CLINICAL DATA:  OG tube placement EXAM: ABDOMEN - 1 VIEW COMPARISON:  01/20/2019 FINDINGS: Limited field of view for tube placement verification purposes. An enteric tube is present with tip projecting over the right upper quadrant consistent location of the distal stomach or proximal duodenum. Visualized bowel gas pattern is normal. Mild perihilar infiltration is suggested in the lungs. Surgical clip in the right upper quadrant. Residual contrast material in the urinary tract. IMPRESSION: Enteric tube tip projects over the right upper quadrant consistent with location in the distal stomach or proximal duodenum. Electronically Signed   By: Boyce Byes M.D.   On: 05/27/2024 20:24   Portable Chest x-ray Result Date: 05/27/2024 CLINICAL DATA:  Endotracheal tube.  OG tube placement. EXAM: PORTABLE CHEST 1 VIEW COMPARISON:  CT chest 09/02/2022 FINDINGS: Endotracheal tube is present with tip measuring 4.2 cm above the carina. Enteric tube is present. Tip is off the field of view but below the left hemidiaphragm. Shallow inspiration. Cardiac enlargement. Perihilar infiltrates, greater on the left. This may represent edema or pneumonia. No pleural effusion or pneumothorax. Patient is rotated towards the right. IMPRESSION: 1. Appliances appear in satisfactory position. 2. Cardiac enlargement. 3. Perihilar infiltrates, greater on the left, possibly pneumonia or edema. Electronically Signed   By: Boyce Byes M.D.   On: 05/27/2024 20:23   CT ANGIO HEAD NECK W WO CM (CODE STROKE) Result Date: 05/27/2024 CLINICAL DATA:  Neuro deficit, concern for stroke, right-sided weakness. EXAM: CT ANGIOGRAPHY HEAD AND NECK WITH AND WITHOUT CONTRAST TECHNIQUE: Multidetector CT imaging of the head and neck was performed using the standard protocol during bolus administration of intravenous contrast. Multiplanar CT image reconstructions and MIPs were  obtained to evaluate the vascular anatomy. Carotid stenosis measurements (when applicable) are obtained utilizing NASCET criteria, using the distal internal carotid diameter as the denominator. RADIATION DOSE REDUCTION: This exam was performed according to the departmental dose-optimization program which includes automated exposure control, adjustment of the mA and/or kV according to patient size and/or use of iterative reconstruction technique. CONTRAST:  75mL OMNIPAQUE  IOHEXOL  350 MG/ML SOLN COMPARISON:  Same day CT head.  CTA head and neck 03/31/2023. FINDINGS: CTA NECK FINDINGS Aortic arch: Standard configuration of the aortic arch. Imaged portion shows no evidence of aneurysm or dissection. Atherosclerosis of the visualized aortic arch involving the origins of the aortic arch vessels. There is prominent noncalcified atherosclerotic plaque involving the proximal left subclavian artery. Additional noncalcified atherosclerotic plaque involving the brachiocephalic artery origin resulting in mild stenosis. Pulmonary arteries: As permitted by contrast timing, there are no filling defects in the visualized pulmonary arteries. Subclavian arteries: Similar focal occlusion of the proximal left subclavian artery with reconstitution of the distal left subclavian. Right subclavian arteries patent. Right carotid system: Patent from the origin to the skull base. Atherosclerosis at the carotid bifurcation without stenosis greater than 50%. No evidence of dissection. Left carotid system: Patent from the origin to the carotid  bifurcation. Multifocal atherosclerosis of the common carotid artery. Redemonstrated left common carotid to left subclavian artery bypass which appears patent. There is noncalcified atherosclerotic plaque at the carotid bifurcation. The external carotid artery branches are patent. There is abrupt occlusion of the proximal cervical ICA. Vertebral arteries: The right vertebral artery is patent from the origin  to the V3 segment which is significantly diminished in caliber similar to prior. There is severely diminished caliber and intraluminal contrast within the intracranial vertebral artery. The left vertebral artery demonstrates intermittent contrast opacification primarily within the V2 segment. The left vertebral artery origins not well visualized. The left V3 segment is patent with poor contrast opacification of the left V4 segment similar to prior. Skeleton: No acute or aggressive finding noted. Other neck: The visualized airway is patent. No cervical lymphadenopathy. Upper chest: Visualized lung apices are clear. Review of the MIP images confirms the above findings CTA HEAD FINDINGS ANTERIOR CIRCULATION: The left internal carotid artery is occluded from the cervical segment to the distal supraclinoid ICA. There is reconstitution at the ICA terminus likely via the circle-of-Willis. The right internal carotid artery is patent to the ICA terminus. Atherosclerosis of the right carotid siphon with moderate stenosis of the supraclinoid ICA. MCAs: Patent bilaterally. Mild narrowing at the origin of the left M1 segment similar to prior. Moderate stenosis of a proximal M2 inferior division branch of the left MCA. ACAs: Patent bilaterally. Moderate narrowing of the left A2 segment. POSTERIOR CIRCULATION: Small caliber basilar artery similar to prior without evidence of focal occlusion. There is poor contrast opacification of both PCAs similar to prior. The SCA is are visualized proximally. Venous sinuses: As permitted by contrast timing, patent. Anatomic variants: None Review of the MIP images confirms the above findings IMPRESSION: Occlusion of the left internal carotid artery from the proximal cervical segment to the ICA terminus likely secondary to noncalcified atherosclerotic plaque near the carotid bifurcation. Reconstitution of the ICA terminus via the circle-of-Willis. Multifocal atherosclerosis in the neck.  Redemonstrated multifocal occlusion of the left vertebral artery in the neck. Poor contrast opacification of the V3 and V4 segments bilaterally. Similar occlusion of the proximal left subclavian artery with patent left common carotid to left subclavian bypass noted. Intracranial atherosclerotic disease as above. Moderate stenosis of the right supraclinoid ICA. Focal moderate stenosis of a proximal M2 branch of the left MCA. Moderate stenosis of the A2 segment left ACA. These results were called by telephone at the time of interpretation on 05/27/2024 at 4:15 pm to provider Dr. Liam Redhead, who verbally acknowledged these results. Electronically Signed   By: Denny Flack M.D.   On: 05/27/2024 16:16   CT HEAD CODE STROKE WO CONTRAST Result Date: 05/27/2024 CLINICAL DATA:  Code stroke. Neuro deficit, concern for stroke, right-sided weakness. EXAM: CT HEAD WITHOUT CONTRAST TECHNIQUE: Contiguous axial images were obtained from the base of the skull through the vertex without intravenous contrast. RADIATION DOSE REDUCTION: This exam was performed according to the departmental dose-optimization program which includes automated exposure control, adjustment of the mA and/or kV according to patient size and/or use of iterative reconstruction technique. COMPARISON:  CT head and CTA head and neck 03/31/2023. FINDINGS: Brain: No acute intracranial hemorrhage. Similar appearance of encephalomalacia throughout the right cerebral hemisphere particularly in the frontoparietal lobes and superior left occipital lobe. Additional remote infarcts in the bilateral cerebellum. Nonspecific hypoattenuation in the periventricular and subcortical white matter favored to reflect chronic microvascular ischemic changes. No edema, mass effect, or midline shift. The basilar cisterns  are patent. Ventricles: Slight ex vacuo dilatation of the right lateral ventricle. No hydrocephalus. Vascular: Atherosclerosis in the right carotid siphon. Redemonstrated  stent in the left carotid siphon. Skull: No acute or aggressive finding. Orbits: Orbits are symmetric. Sinuses: Mucosal thickening in the ethmoid sinuses. Other: Mastoid air cells are clear. ASPECTS [facility] Stroke Program Early CT Score) - Ganglionic level infarction (caudate, lentiform nuclei, internal capsule, insula, M1-M3 cortex): 7 - Supraganglionic infarction (M4-M6 cortex): 3 Total score (0-10 with 10 being normal): 10 IMPRESSION: 1. No CT evidence of acute intracranial abnormality. 2. Similar remote infarcts in the left cerebral hemisphere and bilateral cerebellum. Chronic microvascular ischemic changes. 3. ASPECTS is 10 These results were called by telephone at the time of interpretation on 05/27/2024 at 3:22 pm to provider Dorenda Gandy , who verbally acknowledged these results. Electronically Signed   By: Denny Flack M.D.   On: 05/27/2024 15:22    Assessment/Plan: S/p complete revascularization of the occluded distal left internal carotid artery supraclinoid segment, and left M1 segment, with 3 passes of contact  aspiration achieving a TICI 3 revascularization  Remains intubated/sedated. Reviewed with RN plan to start ASA and Brilinta  today.    LOS: 1 day   I spent a total of 20 minutes in face to face in clinical consultation, greater than 50% of which was counseling/coordinating care for CVA intervention  Prudence Brown PA-C 05/28/2024 9:16 AM

## 2024-05-28 NOTE — Progress Notes (Signed)
 Left 4 french venous femoral sheath removed at 1000. Manual pressure applied with hemostasis obtained at 1010. Dressing applied and site reviewed with [facility]. No acute complications. Distal pulses remain intact.

## 2024-05-28 NOTE — Plan of Care (Signed)
  Problem: Clinical Measurements: Goal: Ability to maintain clinical measurements within normal limits will improve Outcome: Progressing Goal: Will remain free from infection Outcome: Progressing Goal: Diagnostic test results will improve Outcome: Progressing Goal: Respiratory complications will improve Outcome: Progressing Goal: Cardiovascular complication will be avoided Outcome: Progressing   Problem: Activity: Goal: Risk for activity intolerance will decrease Outcome: Progressing   Problem: Elimination: Goal: Will not experience complications related to urinary retention Outcome: Progressing   Problem: Pain Managment: Goal: General experience of comfort will improve and/or be controlled Outcome: Progressing   Problem: Safety: Goal: Ability to remain free from injury will improve Outcome: Progressing   Problem: Skin Integrity: Goal: Risk for impaired skin integrity will decrease Outcome: Progressing   Problem: Ischemic Stroke/TIA Tissue Perfusion: Goal: Complications of ischemic stroke/TIA will be minimized Outcome: Progressing   Problem: Cardiovascular: Goal: Vascular access site(s) Level 0-1 will be maintained Outcome: Progressing   Problem: Safety: Goal: Non-violent Restraint(s) Outcome: Progressing   Problem: Education: Goal: Knowledge of General Education information will improve Description: Including pain rating scale, medication(s)/side effects and non-pharmacologic comfort measures Outcome: Not Progressing   Problem: Health Behavior/Discharge Planning: Goal: Ability to manage health-related needs will improve Outcome: Not Progressing   Problem: Nutrition: Goal: Adequate nutrition will be maintained Outcome: Not Progressing   Problem: Coping: Goal: Level of anxiety will decrease Outcome: Not Progressing   Problem: Elimination: Goal: Will not experience complications related to bowel motility Outcome: Not Progressing   Problem:  Education: Goal: Knowledge of disease or condition will improve Outcome: Not Progressing Goal: Knowledge of secondary prevention will improve (MUST DOCUMENT ALL) Outcome: Not Progressing Goal: Knowledge of patient specific risk factors will improve (DELETE if not current risk factor) Outcome: Not Progressing   Problem: Coping: Goal: Will verbalize positive feelings about self Outcome: Not Progressing Goal: Will identify appropriate support needs Outcome: Not Progressing   Problem: Health Behavior/Discharge Planning: Goal: Ability to manage health-related needs will improve Outcome: Not Progressing Goal: Goals will be collaboratively established with patient/family Outcome: Not Progressing   Problem: Self-Care: Goal: Ability to participate in self-care as condition permits will improve Outcome: Not Progressing Goal: Verbalization of feelings and concerns over difficulty with self-care will improve Outcome: Not Progressing Goal: Ability to communicate needs accurately will improve Outcome: Not Progressing   Problem: Nutrition: Goal: Risk of aspiration will decrease Outcome: Not Progressing Goal: Dietary intake will improve Outcome: Not Progressing   Problem: Activity: Goal: Ability to tolerate increased activity will improve Outcome: Not Progressing   Problem: Respiratory: Goal: Ability to maintain a clear airway and adequate ventilation will improve Outcome: Not Progressing   Problem: Role Relationship: Goal: Method of communication will improve Outcome: Not Progressing   Problem: Education: Goal: Understanding of CV disease, CV risk reduction, and recovery process will improve Outcome: Not Progressing Goal: Individualized Educational Video(s) Outcome: Not Progressing   Problem: Activity: Goal: Ability to return to baseline activity level will improve Outcome: Not Progressing   Problem: Cardiovascular: Goal: Ability to achieve and maintain adequate  cardiovascular perfusion will improve Outcome: Not Progressing   Problem: Health Behavior/Discharge Planning: Goal: Ability to safely manage health-related needs after discharge will improve Outcome: Not Progressing

## 2024-05-28 NOTE — Progress Notes (Signed)
 SLP Cancellation Note  Patient Details Name: Rita [name] MRN: [id number] DOB: 02-11-1966   Cancelled treatment:  Intubated; not medically ready for SLP eval. Will follow.  Cherril Hett L. Beatris Lincoln, MA CCC/SLP Clinical Specialist - Acute Care SLP Acute Rehabilitation Services Office number [phone]         Myna Asal Laurice 05/28/2024, 10:34 AM

## 2024-05-28 NOTE — Progress Notes (Signed)
Pt transported to and from MRI on the ventilator without incident. 

## 2024-05-28 NOTE — Progress Notes (Signed)
 NAME:  Rita [name], MRN:  [id number], DOB:  27-Feb-1966, LOS: 1 ADMISSION DATE:  05/27/2024 CONSULTATION DATE:  Code Stroke, post-procedure vent management   History of Present Illness:  58 year old woman who presented to [facility] as a transfer from [facility] 6/2 with R-sided deficits. LKW 1415. PMHx significant for HTN, HLD, CVA, s/p L carotid subclavian bypass graft (on DAPT with Brilinta /ASA), COPD, tobacco use, GERD with Barrett's esophagus, chronic pancreatitis, chronic opioid use (on buprenorphine ), depression.  Patient presented to [facility] ED as a Code Stroke with R-sided deficits. LKW 1415. CT Head NAICA with remote infarcts in L cerebral hemisphere/bilateral cerebellum, chronic microvascular changes. CTA Head/Neck demonstrated occlusion of the L ICA from the proximal cervical segment to the ICA terminus and multifocal atherosclerosis within the neck as well as occlusion of L vertebral artery in the neck/similar occlusion of proximal L subclavian artery. TNK given 1544. NIR activated.  Patient was taken to [facility] where she had L common carotid arteriogram with complete revascularization of occluded distal L ICA supraclinoid segment and L M1 segment, with 3 passes achieving TICI 3 revascularization.  Pertinent Medical History:   Past Medical History:  Diagnosis Date   Arthritis    "qwhere" (02/22/2018)   Barrett's esophagus with esophagitis 03/26/2013   Chronic abdominal pain    Chronic lower back pain    Chronic pancreatitis (HCC)    COPD (chronic obstructive pulmonary disease) (HCC)    Depression    Dyspnea    GERD (gastroesophageal reflux disease)    Heart murmur    Hiatal hernia    High cholesterol    Hypertension    Peptic ulcer    Pneumonia 2000s X 1   Stroke (HCC)    Tobacco use 03/26/2013   Significant Hospital Events: Including procedures, antibiotic start and stop dates in addition to other pertinent events   6/2 - Presented as Code Stroke. LKW 1415. CT Head NAICA. CTA Head/Neck  demonstrated occlusion of the L ICA from the proximal cervical segment to the ICA terminus and multifocal atherosclerosis within the neck as well as occlusion of L vertebral artery in the neck/similar occlusion of proximal L subclavian artery. Taken to [facility]. PCCM consulted for post-procedure vent management.  Interim History / Subjective:  No significant events Remained intubated post-NIR revascularization of occluded flow-diverter stent (L ICA) and L MCA M1 Repeat CT Head demonstrated stable remote L occipital/posterior watershed infarcts and stable remote cerebellar infarcts Remains on significant sedation, Prop/Fentanyl  Cleviprex  transitioned to Cardene in the setting of hypertriglyceridemia Goal to transition off of Prop, pending extubation plan Still requiring PEEP 8 with some vent dyssynchrony  MRI Brain today 6/3  Objective:   Blood pressure 119/63, pulse 94, temperature 98.3 F (36.8 C), temperature source Oral, resp. rate (!) 24, weight 102 kg, last menstrual period 02/09/2012, SpO2 98%.    Vent Mode: PRVC FiO2 (%):  [50 %-100 %] 50 % Set Rate:  [16 bmp] 16 bmp Vt Set:  [480 mL-490 mL] 480 mL PEEP:  [5 cmH20-8 cmH20] 8 cmH20 Plateau Pressure:  [17 cmH20-19 cmH20] 19 cmH20   Intake/Output Summary (Last 24 hours) at 05/28/2024 0736 Last data filed at 05/28/2024 0700 Gross per 24 hour  Intake 2466.47 ml  Output 2655 ml  Net -188.53 ml   Filed Weights   05/27/24 1534 05/27/24 1955  Weight: 99.7 kg 102 kg     Physical Examination: General: Acutely ill-appearing middle-aged woman in NAD. HEENT: [place]/AT, anicteric sclera, PERRL 3mm, moist mucous membranes. Neuro: Intubated,  sedated. Responds to noxious stimuli. Not following commands. Moves all 4 extremities spontaneously, L > R. +Corneal, +Cough, and +Gag  CV: RRR, +III/VI systolic murmur heard best over R second ICS. PULM: Breathing even and unlabored on vent (PEEP 8, FiO2 50%), occasional vent dyssynchrony. Lung fields CTAB  anteriorly. GI: Obese, soft, nontender, nondistended. Normoactive bowel sounds. Extremities: Trace bilateral symmetric LE edema noted. Skin: Warm/dry, no rashes.  Assessment & Plan:   L carotid occlusion with ischemic CVA - s/p NIR for L common carotid arteriogram with complete revascularization of occluded distal L ICA supraclinoid segment, and L M1 segment, with 3 passes achieving TICI 3 revascularization. Echo 6/3 with EF 65-70%, +hyperdynamic LV, G1DD, normal RV function, mildly elevated PASP (RVSP 37.1), no MR, +increased aortic velocities; negative bubble study. Hx of CVA Hx of L carotid subclavian bypass graft (on DAPT) - Stroke team primary - S/p NIR revascularization of flow diverter stent + L MCA M1 occlusion - Goal SBP 120-140 - Cleviprex  transitioned to Cardene in the setting of hypertriglyceridemia - Antiplatelet therapy with cangrelor, transition to Brilinta  - F/u MRI Brain - Frequent neuro checks - Neuroprotective measures: HOB > 30 degrees, normoglycemia, normothermia, electrolytes WNL - PT/OT/SLP when able to participate in care  Respiratory insufficiency post-procedure, requiring mechanical ventilation History of COPD - Continue full vent support (4-8cc/kg IBW), plan to wean sedation 6/4AM - Wean FiO2 for O2 sat > 90% - Daily WUA/SBT once appropriate for a mental status standpoint - VAP bundle - Pulmonary hygiene - PAD protocol for sedation: Propofol , Fentanyl , and Versed  for goal RASS 0 to -1; goal transition off of Propofol  in the setting of elevated TGs  Hx HTN Hyperlipidemia/hypercholesterolemia Hypertriglyceridemia - Goal SBP 120-140 per NIR, then 180/105 for 24H post-TNK - Cardene titrated to goal SBP - Resume home statin/Zetia , may need additional agents/dose increases  Hx GERD with Barrett's esophagus (on home Dexilant ). - PPI  Hx Chronic opioid use (on buprenorphine  PTA) Cocaine, THC+ on UDS Depression Tobacco abuse - PAD protocol as above -  Nicotine  patch - TOC consult for cessation resources  Hypokalemia - Trend BMP - Replete electrolytes as indicated - Monitor I&Os  Best Practice: (right click and "Reselect all SmartList Selections" daily)   Diet/type: NPO DVT prophylaxis: SCDs GI prophylaxis: PPI Lines: N/A Foley:  N/A Code Status:  full code Last date of multidisciplinary goals of care discussion [Per Primary Team]  Critical care time:   The patient is critically ill with multiple organ system failure and requires high complexity decision making for assessment and support, frequent evaluation and titration of therapies, advanced monitoring, review of radiographic studies and interpretation of complex data.   Critical Care Time devoted to patient care services, exclusive of separately billable procedures, described in this note is 38 minutes.  Star [place], PA-C [facility] 05/28/24 7:37 AM  Please see [email or web address] for pager details.  From 7A-7P if no response, please call [phone] After hours, please call ELink [phone]

## 2024-05-28 NOTE — Procedures (Signed)
 Left 4 french venous femoral sheath removed at 1000. Manual pressure applied to obtain hemostasis at 1010. Dressing applied and site reviewed with [facility]. No acute complications. Distal pulses intact.

## 2024-05-28 NOTE — Anesthesia Postprocedure Evaluation (Signed)
 Anesthesia Post Note  Patient: Rita [name]  Procedure(s) Performed: RADIOLOGY WITH ANESTHESIA     Patient location during evaluation: SICU Anesthesia Type: General Level of consciousness: sedated Pain management: pain level controlled Vital Signs Assessment: post-procedure vital signs reviewed and stable Respiratory status: patient remains intubated per anesthesia plan Cardiovascular status: stable Postop Assessment: no apparent nausea or vomiting Anesthetic complications: no   No notable events documented.  Last Vitals:  Vitals:   05/28/24 0645 05/28/24 0700  BP:  120/61  Pulse: 93 95  Resp:    Temp:    SpO2: 99% 99%    Last Pain:  Vitals:   05/28/24 0400  TempSrc: Oral  PainSc:                  Rita [name]

## 2024-05-28 NOTE — Progress Notes (Signed)
 OT Cancellation Note  Patient Details Name: Rita [name] MRN: [id number] DOB: 1966-04-07   Cancelled Treatment:    Reason Eval/Treat Not Completed: Patient not medically ready (intubated and sedation)  Neomia Banner 05/28/2024, 8:58 AM

## 2024-05-28 NOTE — Progress Notes (Signed)
 RT and RN took pt via ventilator to CT and back. There we no apparent complications.

## 2024-05-28 NOTE — Progress Notes (Signed)
 Initial Nutrition Assessment  DOCUMENTATION CODES:   Not applicable  INTERVENTION:  Initiated tube feeds via OG-tube: Initiate Osmolite 1.5 at 25 mL/hr and advance by 10 mL every 4 hours to goal rate of 55 mL/hr (1320 mL per day) 60 mL ProSource TF20 - Daily  Regimen at goal provides 2060 kcal, 103 gm protein, and 1006 mL free water  daily.   NUTRITION DIAGNOSIS:   Inadequate oral intake related to inability to eat as evidenced by NPO status.  GOAL:   Patient will meet greater than or equal to 90% of their needs  MONITOR:   Vent status, Labs, Weight trends, I & O's  REASON FOR ASSESSMENT:   Ventilator    ASSESSMENT:   58 y.o. female presented to [facility] ED with R sided weakness and transferred to [facility]. PMH includes HTN, HLD, CVA, chronic pancreatitis, COPD, GERD, Barrett's esophagus, and tobacco abuse. Pt admitted with L ischemic CVA.   6/02 - Admitted; s/p L common carotid arteriogram; Intubated  RN at bedside, currently no plan to extubate today. No family at bedside. Limited weight history within EMR, does not appear to have weight loss within the past year.  Reached out to CCM to start tube feeds, team ok with starting today.   Patient is currently intubated on ventilator support. MV: 13.6 L/min MAP (a-line): 89 Temp (24hrs), Avg:98 F (36.7 C), Min:97 F (36.1 C), Max:98.6 F (37 C)  Nutrition Related Medications: Colace, NovoLog  0-9 units q4h, Protonix , Miralax , Potassium Chloride  Drips Fentanyl  Propofol  29.9 mL/hr (789 kcal/day) Labs: Sodium 137, Potassium 3.2, BUN 11, Creatinine 0.75, Hgb A1c 6.3  CBG: 114-177 mg/dL x 24 hrs   UOP: 1478 mL x 24 hrs NGT output: 900 mL x 24 hrs   NUTRITION - FOCUSED PHYSICAL EXAM:  Flowsheet Row Most Recent Value  Orbital Region Unable to assess  Upper Arm Region No depletion  Thoracic and Lumbar Region No depletion  Buccal Region Unable to assess  Temple Region No depletion  Clavicle Bone Region No depletion  Clavicle  and Acromion Bone Region No depletion  Scapular Bone Region No depletion  Dorsal Hand Unable to assess  [[place]]  Patellar Region No depletion  Anterior Thigh Region No depletion  Posterior Calf Region No depletion  Edema (RD Assessment) None  Hair Reviewed  Eyes Unable to assess  Mouth Unable to assess  Skin Reviewed  Nails Unable to assess   Diet Order:   Diet Order             Diet NPO time specified  Diet effective now                   EDUCATION NEEDS:   Not appropriate for education at this time  Skin:  Skin Assessment: Reviewed RN Assessment  Last BM:  Unknown/PTA  Height:  Ht Readings from Last 1 Encounters:  03/31/23 5\' 7"  (1.702 m)   Weight:  Wt Readings from Last 1 Encounters:  05/27/24 102 kg   Ideal Body Weight:  61.4 kg  BMI:  Body mass index is 35.22 kg/m.  Estimated Nutritional Needs:  Kcal:  1950-2150 Protein:  100-120 grams Fluid:  >/= 2 L    Doneta Furbish RD, LDN Clinical Dietitian

## 2024-05-28 NOTE — TOC Initial Note (Signed)
 Transition of Care [facility]) - Initial/Assessment Note    Patient Details  Name: Rita [name] MRN: [id number] Date of Birth: February 16, 1966  Transition of Care [facility]) CM/SW Contact:    Omie Bickers, RN Phone Number: 05/28/2024, 3:41 PM  Clinical Narrative:                   Patient admitted from home with R sided weakness and aphasia, TNK.  Per chart review, hopeful for extubation today.  TOC will continue to follow and meet with patient once extubated.    Barriers to Discharge: Continued Medical Work up   Patient Goals and CMS Choice Patient states their goals for this hospitalization and ongoing recovery are:: patient currently intubated/ sedated          Expected Discharge Plan and Services   Discharge Planning Services: CM Consult   Living arrangements for the past 2 months: Single Family Home                                      Prior Living Arrangements/Services Living arrangements for the past 2 months: Single Family Home                     Activities of Daily Living      Permission Sought/Granted                  Emotional Assessment              Admission diagnosis:  Acute ischemic stroke [facility]) [I63.9] Acute CVA (cerebrovascular accident) (HCC) [I63.9] Middle cerebral artery embolism, left [I66.02] Patient Active Problem List   Diagnosis Date Noted   Middle cerebral artery embolism, left 05/27/2024   Acute hypoxic respiratory failure (HCC) 05/27/2024   Acute CVA (cerebrovascular accident) (HCC) 03/31/2023   Internal carotid artery stenosis, left 09/08/2022   Opioid dependence (HCC) 09/02/2022   Obesity, Class II, BMI 35-39.9 09/02/2022   Lung mass 09/02/2022   Impaired glucose tolerance 09/02/2022   Acute ischemic stroke (HCC) 09/01/2022   Mixed hyperlipidemia 09/01/2022   Cavitating mass in right lower lung lobe (PNA Vs Cancer) 01/21/2019   Gastroenteritis 01/20/2019   Left sided cerebral hemisphere cerebrovascular accident  (CVA) (HCC) 01/20/2019   Stenosis of left subclavian artery (HCC) 03/19/2018   Chest pain 02/21/2018   Left subclavian artery occlusion 02/21/2018   Rectal bleeding 03/21/2017   Hiatal hernia with gastroesophageal reflux disease and esophagitis    Biliary pain    Pain of upper abdomen    Elevated liver enzymes    Chronic abdominal pain    Pancreatitis 02/06/2015   Acute pancreatitis    Elevated LFTs    Pancreatitis, acute 05/09/2013   Hypokalemia 03/27/2013   Anemia 03/27/2013   Esophageal dysphagia 03/26/2013   Chronic back pain 03/26/2013   GERD (gastroesophageal reflux disease) 03/26/2013   Tobacco use 03/26/2013   Barrett's esophagus with esophagitis 03/26/2013   Elevated lipase 08/20/2011   PCP:  [facility]:   [facility] [phone] - [place], [place] - [address] AT [facility] [address] [place] [address] Phone: [phone] Fax: [phone]     Social Drivers of Health (SDOH) Social History: SDOH Screenings   Food Insecurity: Patient Unable To Answer (05/28/2024)  Housing: Patient Unable To Answer (05/28/2024)  Transportation Needs: Patient Unable To Answer (05/28/2024)  Utilities: Patient Unable  To Answer (05/28/2024)  Tobacco Use: High Risk (05/27/2024)   SDOH Interventions:     Readmission Risk Interventions     No data to display

## 2024-05-28 NOTE — Progress Notes (Signed)
 PT Cancellation Note  Patient Details Name: Rita [name] MRN: [id number] DOB: [birth year]   Cancelled Treatment:    Reason Eval/Treat Not Completed: Patient not medically ready (active bedrest order; intubated).  Verdia Glad, PT, DPT Acute Rehabilitation Services Office [phone]    Claria Crofts 05/28/2024, 8:04 AM

## 2024-05-28 NOTE — Progress Notes (Addendum)
 STROKE TEAM PROGRESS NOTE    SIGNIFICANT HOSPITAL EVENTS 6/2-patient presented to [facility] with right-sided weakness and aphasia, TNK was administered and patient was transferred here for thrombectomy. TICI 3 revascularization of distal left ICA stent occlusion and MCA achieved, remained intubated after procedure  INTERIM HISTORY/SUBJECTIVE Follow-up CT angiogram early this morning shows patent left ICA and MCA and CT head shows no hemorrhage or signs of a large stroke.  Patient remains intubated and sedated. Patient continues to require nicardipine to maintain blood pressure within parameters.  Cleviprex  was switched to nicardipine due to high triglycerides, an attempt is being made to wean propofol  and hopefully extubate patient today.  OBJECTIVE  CBC    Component Value Date/Time   WBC 16.7 (H) 05/28/2024 0548   RBC 4.21 05/28/2024 0548   HGB 13.3 05/28/2024 0548   HCT 37.3 05/28/2024 0548   PLT 454 (H) 05/28/2024 0548   MCV 88.6 05/28/2024 0548   MCH 31.6 05/28/2024 0548   MCHC 35.7 05/28/2024 0548   RDW 14.0 05/28/2024 0548   LYMPHSABS 0.9 05/28/2024 0548   MONOABS 0.6 05/28/2024 0548   EOSABS 0.0 05/28/2024 0548   BASOSABS 0.0 05/28/2024 0548    BMET    Component Value Date/Time   NA 137 05/28/2024 0548   K 3.2 (L) 05/28/2024 0548   CL 104 05/28/2024 0548   CO2 23 05/28/2024 0548   GLUCOSE 199 (H) 05/28/2024 0548   BUN 11 05/28/2024 0548   CREATININE 0.75 05/28/2024 0548   CREATININE 0.94 02/08/2018 1209   CALCIUM  8.4 (L) 05/28/2024 0548   GFRNONAA >60 05/28/2024 0548    IMAGING past 24 hours ECHOCARDIOGRAM COMPLETE BUBBLE STUDY Result Date: 05/28/2024    ECHOCARDIOGRAM REPORT   Patient Name:   Rita [name] Date of Exam: 05/28/2024 Medical Rec #:  [id number]     Height:       67.0 in Accession #:    [id number]    Weight:       224.9 lb Date of Birth:  08/20/1966     BSA:          2.126 m Patient Age:    57 years      BP:           120/61 mmHg Patient  Gender: F             HR:           86 bpm. Exam Location:  Inpatient Procedure: 2D Echo, Color Doppler and Cardiac Doppler (Both Spectral and Color            Flow Doppler were utilized during procedure). Indications:    Stroke  History:        Patient has prior history of Echocardiogram examinations, most                 recent 03/31/2023. Risk Factors:Hypertension.  Sonographer:    Jeralene Mom Referring Phys: [id number] Alphonza Ashing STACK IMPRESSIONS  1. Left ventricular ejection fraction, by estimation, is 65 to 70%. The left ventricle has hyperdynamic function. The left ventricle has no regional wall motion abnormalities. Left ventricular diastolic parameters are consistent with Grade I diastolic dysfunction (impaired relaxation).  2. Right ventricular systolic function is normal. The right ventricular size is normal. There is mildly elevated pulmonary artery systolic pressure. The estimated right ventricular systolic pressure is 37.1 mmHg.  3. The mitral valve is normal in structure. No evidence of mitral valve regurgitation. No evidence of mitral stenosis.  4. Increased aortic valve velocities are at least in part secondary to hyperdynamic state/increased cardiac output. The aortic valve is tricuspid. There is mild thickening of the aortic valve. Aortic valve regurgitation is not visualized. Aortic valve sclerosis is present, with no evidence of aortic valve stenosis.  5. Agitated saline contrast bubble study was negative, with no evidence of any interatrial shunt. FINDINGS  Left Ventricle: Left ventricular ejection fraction, by estimation, is 65 to 70%. The left ventricle has hyperdynamic function. The left ventricle has no regional wall motion abnormalities. The left ventricular internal cavity size was small. There is no  left ventricular hypertrophy. Left ventricular diastolic parameters are consistent with Grade I diastolic dysfunction (impaired relaxation). Indeterminate filling pressures. Right Ventricle:  The right ventricular size is normal. Right vetricular wall thickness was not well visualized. Right ventricular systolic function is normal. There is mildly elevated pulmonary artery systolic pressure. The tricuspid regurgitant velocity  is 2.92 m/s, and with an assumed right atrial pressure of 3 mmHg, the estimated right ventricular systolic pressure is 37.1 mmHg. Left Atrium: Left atrial size was normal in size. Right Atrium: Right atrial size was normal in size. Pericardium: There is no evidence of pericardial effusion. Mitral Valve: The mitral valve is normal in structure. No evidence of mitral valve regurgitation. No evidence of mitral valve stenosis. Tricuspid Valve: The tricuspid valve is normal in structure. Tricuspid valve regurgitation is trivial. Aortic Valve: Increased aortic valve velocities are at least in part secondary to hyperdynamic state/increased cardiac output. The aortic valve is tricuspid. There is mild thickening of the aortic valve. Aortic valve regurgitation is not visualized. Aortic valve sclerosis is present, with no evidence of aortic valve stenosis. Aortic valve mean gradient measures 15.7 mmHg. Aortic valve peak gradient measures 30.5 mmHg. Aortic valve area, by VTI measures 2.42 cm. Pulmonic Valve: The pulmonic valve was grossly normal. Pulmonic valve regurgitation is not visualized. No evidence of pulmonic stenosis. Aorta: The aortic root is normal in size and structure. IAS/Shunts: No atrial level shunt detected by color flow Doppler. Agitated saline contrast bubble study was negative, with no evidence of any interatrial shunt.  LEFT VENTRICLE PLAX 2D LVIDd:         4.80 cm   Diastology LVIDs:         3.00 cm   LV e' medial:    7.29 cm/s LV PW:         0.90 cm   LV E/e' medial:  12.5 LV IVS:        1.10 cm   LV e' lateral:   9.90 cm/s LVOT diam:     2.20 cm   LV E/e' lateral: 9.2 LV SV:         113 LV SV Index:   53 LVOT Area:     3.80 cm  LEFT ATRIUM           Index LA diam:       3.80 cm 1.79 cm/m LA Vol (A4C): 45.4 ml 21.36 ml/m  AORTIC VALVE AV Area (Vmax):    2.36 cm AV Area (Vmean):   2.39 cm AV Area (VTI):     2.42 cm AV Vmax:           276.00 cm/s AV Vmean:          180.000 cm/s AV VTI:            0.469 m AV Peak Grad:      30.5 mmHg AV Mean Grad:  15.7 mmHg LVOT Vmax:         171.00 cm/s LVOT Vmean:        113.000 cm/s LVOT VTI:          0.298 m LVOT/AV VTI ratio: 0.64  AORTA Ao Root diam: 2.90 cm MITRAL VALVE                TRICUSPID VALVE MV Area (PHT): 3.31 cm     TR Peak grad:   34.1 mmHg MV Decel Time: 229 msec     TR Vmax:        292.00 cm/s MV E velocity: 91.30 cm/s MV A velocity: 122.00 cm/s  SHUNTS MV E/A ratio:  0.75         Systemic VTI:  0.30 m                             Systemic Diam: 2.20 cm Karyl Paget Croitoru MD Electronically signed by Luana Rumple MD Signature Date/Time: 05/28/2024/1:10:16 PM    Final    IR PATIENT EVAL TECH 0-60 MINS Result Date: 05/28/2024 Cleotis Daily     05/28/2024 10:23 AM Left 4 french venous femoral sheath removed at 1000. Manual pressure applied to obtain hemostasis at 1010. Dressing applied and site reviewed with [facility]. No acute complications. Distal pulses intact.   Portable Chest xray Result Date: 05/28/2024 CLINICAL DATA:  Status post ET tube placement. EXAM: PORTABLE CHEST 1 VIEW COMPARISON:  05/27/2024 FINDINGS: The endotracheal tube tip is 5.5 cm above the carina. Enteric tube courses below the GE junction. Stable cardiomediastinal contours. Mild subsegmental atelectasis in the left midlung and medial right lung base. No significant pleural effusion, interstitial edema or airspace disease. No pneumothorax identified. Visualized osseous structures appear intact. IMPRESSION: 1. Satisfactory position of endotracheal tube. 2. Mild subsegmental atelectasis in the left midlung and medial right lung base. Electronically Signed   By: Kimberley Penman M.D.   On: 05/28/2024 06:08   CT HEAD WO CONTRAST ( ) Result Date:  05/28/2024 EXAM: CT HEAD WITHOUT 05/28/2024 05:25:22 AM TECHNIQUE: CT of the head was performed without the administration of intravenous contrast. Automated exposure control, iterative reconstruction, and/or weight based adjustment of the mA/kV was utilized to reduce the radiation dose to as low as reasonably achievable. COMPARISON: CT head without contrast 05/27/2024. CT head without contrast 05/19/2023. CLINICAL HISTORY: Status post revascularization of occluded left ICA. Stroke, follow up. FINDINGS: BRAIN AND VENTRICLES: Remote left occipital and posterior watershed infarcts are stable. Remote cerebellar infarcts are again noted, more prominent on the right. There is no acute intracranial hemorrhage, mass effect or midline shift. No abnormal extra-axial fluid collection. The gray-white differentiation is maintained without an acute infarct. There is no hydrocephalus. ORBITS: The visualized portion of the orbits demonstrate no acute abnormality. SINUSES: Mild mucosal thickening is present in the sphenoid sinuses bilaterally. No fluid levels are present. Mild mucosal thickening is present in the anterior ethmoid air cells and inferior frontal sinuses. The mastoid air cells are clear. SOFT TISSUES AND SKULL: The patient is intubated. OG tube is in place. No acute abnormality of the visualized skull or soft tissues. IMPRESSION: 1. Stable remote left occipital and posterior watershed infarcts. 2. Stable remote cerebellar infarcts, more prominent on the right. Electronically signed by: Audree Leas MD 05/28/2024 05:47 AM EDT RP Workstation: [id number]   CT ANGIO HEAD NECK W WO CM Result Date: 05/28/2024 EXAM: CTA HEAD AND NECK WITH AND WITHOUT  05/28/2024 05:25:22 AM TECHNIQUE: CTA of the head and neck was performed with and without the administration of intravenous contrast. Multiplanar 2D and/or 3D reformatted images are provided for review. Automated exposure control, iterative reconstruction, and/or weight  based adjustment of the mA/kV was utilized to reduce the radiation dose to as low as reasonably achievable. Stenosis of the internal carotid arteries measured using NASCET criteria. COMPARISON: CT head without contrast 05/28/2024 at 05:19 am and CT angio head and neck 05/27/2024 CLINICAL HISTORY: Neuro deficit, acute, stroke suspected. Status post revascularization of occluded flow diverter stent in the left internal carotid artery. Revascularization of left M1 segment. FINDINGS: CTA NECK: AORTIC ARCH AND ARCH VESSELS: Mild atherosclerotic calcifications are again noted at the aortic arch. The great vessel origins are widely patent. CAROTID ARTERIES: Atherosclerotic changes are present at the right carotid bifurcation without significant stenosis. Mild mural plaque is present in the left common carotid artery and bifurcation without significant stenosis or change. The left internal carotid artery is revascularized. VERTEBRAL ARTERIES: The proximal left vertebral artery is occluded. The vessel is reconstituted in the distal left v2 segment. High-grade stenosis and occlusion of the right vertebral artery at the v3 segment is noted. The left vertebral artery is occluded just below the dural margin, stable. The distal vertebral arteries are reconstituted. SOFT TISSUES: The lung apices are clear. No cervical or superior mediastinal lymphadenopathy. The larynx and pharynx are unremarkable. No acute abnormality of the salivary and thyroid  glands. BONES: No acute osseous abnormality. CTA HEAD: ANTERIOR CIRCULATION: Atherosclerotic calcifications are again noted in the supraclinoid right ICA without significant stenosis through the ICA terminus. The left cavernous ICA stent is patent. The A1 and M1 segments are patent. Mild proximal left M1 segment narrowing is present. The ACA and MCA branch vessels opacify normally on both sides. POSTERIOR CIRCULATION: The basilar artery is small. Prominent iliac vessels are again noted.  The superior cerebellar arteries are patent bilaterally, with posterior cerebral arteries originating from the basilar tip. Moderate attenuation of distal PCA branch vessels is present bilaterally, worse on the right. OTHER: No dural venous sinus thrombosis on this non-dedicated study. BRAIN: There is no acute infarct or acute intracranial hemorrhage. No mass effect or midline shift. IMPRESSION: 1. Status post revascularization of occluded flow diverter stent in the left internal carotid artery and revascularization of left M1 segment. Mild proximal left M1 segment narrowing. 2. High-grade stenosis and occlusion of the right vertebral artery at the V3 segment, stable. 3. Mild mural plaque in the left common carotid artery and bifurcation without significant stenosis or change. 4. Atherosclerotic changes at the right carotid bifurcation without significant stenosis. 5. Occlusion of the left vertebral artery just below the dural margin, stable. 6. Moderate attenuation of distal PCA branch vessels bilaterally, worse on the right. Electronically signed by: Audree Leas MD 05/28/2024 05:44 AM EDT RP Workstation: [id number]   CT HEAD POST STROKE FOLLOWUP/TIMED/STAT READ Result Date: 05/27/2024 CLINICAL DATA:  Code stroke.  Neuro deficit, acute, stroke suspected EXAM: CT HEAD WITHOUT CONTRAST TECHNIQUE: Contiguous axial images were obtained from the base of the skull through the vertex without intravenous contrast. RADIATION DOSE REDUCTION: This exam was performed according to the departmental dose-optimization program which includes automated exposure control, adjustment of the mA and/or kV according to patient size and/or use of iterative reconstruction technique. COMPARISON:  CT head May 27, 2024. FINDINGS: Brain: Similar remote infarcts in the left cerebral hemisphere and cerebellum. No evidence of acute large vascular territory infarct, acute hemorrhage, mass  lesion, midline shift or hydrocephalus. Vascular:  Calcific atherosclerosis. Skull: No acute fracture. Sinuses/Orbits: Paranasal sinus mucosal thickening. No acute orbital findings. ASPECTS [facility] Stroke Program Early CT Score) Total score (0-10 with 10 being normal): 10. IMPRESSION: 1. No evidence of acute intracranial abnormality.  ASPECTS is 10. 2. Similar remote infarcts. Electronically Signed   By: Stevenson Elbe M.D.   On: 05/27/2024 23:04   DG Abd 1 View Result Date: 05/27/2024 CLINICAL DATA:  OG tube placement EXAM: ABDOMEN - 1 VIEW COMPARISON:  01/20/2019 FINDINGS: Limited field of view for tube placement verification purposes. An enteric tube is present with tip projecting over the right upper quadrant consistent location of the distal stomach or proximal duodenum. Visualized bowel gas pattern is normal. Mild perihilar infiltration is suggested in the lungs. Surgical clip in the right upper quadrant. Residual contrast material in the urinary tract. IMPRESSION: Enteric tube tip projects over the right upper quadrant consistent with location in the distal stomach or proximal duodenum. Electronically Signed   By: Boyce Byes M.D.   On: 05/27/2024 20:24   Portable Chest x-ray Result Date: 05/27/2024 CLINICAL DATA:  Endotracheal tube.  OG tube placement. EXAM: PORTABLE CHEST 1 VIEW COMPARISON:  CT chest 09/02/2022 FINDINGS: Endotracheal tube is present with tip measuring 4.2 cm above the carina. Enteric tube is present. Tip is off the field of view but below the left hemidiaphragm. Shallow inspiration. Cardiac enlargement. Perihilar infiltrates, greater on the left. This may represent edema or pneumonia. No pleural effusion or pneumothorax. Patient is rotated towards the right. IMPRESSION: 1. Appliances appear in satisfactory position. 2. Cardiac enlargement. 3. Perihilar infiltrates, greater on the left, possibly pneumonia or edema. Electronically Signed   By: Boyce Byes M.D.   On: 05/27/2024 20:23   CT ANGIO HEAD NECK W WO CM (CODE  STROKE) Result Date: 05/27/2024 CLINICAL DATA:  Neuro deficit, concern for stroke, right-sided weakness. EXAM: CT ANGIOGRAPHY HEAD AND NECK WITH AND WITHOUT CONTRAST TECHNIQUE: Multidetector CT imaging of the head and neck was performed using the standard protocol during bolus administration of intravenous contrast. Multiplanar CT image reconstructions and MIPs were obtained to evaluate the vascular anatomy. Carotid stenosis measurements (when applicable) are obtained utilizing NASCET criteria, using the distal internal carotid diameter as the denominator. RADIATION DOSE REDUCTION: This exam was performed according to the departmental dose-optimization program which includes automated exposure control, adjustment of the mA and/or kV according to patient size and/or use of iterative reconstruction technique. CONTRAST:  75mL OMNIPAQUE  IOHEXOL  350 MG/ML SOLN COMPARISON:  Same day CT head.  CTA head and neck 03/31/2023. FINDINGS: CTA NECK FINDINGS Aortic arch: Standard configuration of the aortic arch. Imaged portion shows no evidence of aneurysm or dissection. Atherosclerosis of the visualized aortic arch involving the origins of the aortic arch vessels. There is prominent noncalcified atherosclerotic plaque involving the proximal left subclavian artery. Additional noncalcified atherosclerotic plaque involving the brachiocephalic artery origin resulting in mild stenosis. Pulmonary arteries: As permitted by contrast timing, there are no filling defects in the visualized pulmonary arteries. Subclavian arteries: Similar focal occlusion of the proximal left subclavian artery with reconstitution of the distal left subclavian. Right subclavian arteries patent. Right carotid system: Patent from the origin to the skull base. Atherosclerosis at the carotid bifurcation without stenosis greater than 50%. No evidence of dissection. Left carotid system: Patent from the origin to the carotid bifurcation. Multifocal atherosclerosis  of the common carotid artery. Redemonstrated left common carotid to left subclavian artery bypass which appears patent. There  is noncalcified atherosclerotic plaque at the carotid bifurcation. The external carotid artery branches are patent. There is abrupt occlusion of the proximal cervical ICA. Vertebral arteries: The right vertebral artery is patent from the origin to the V3 segment which is significantly diminished in caliber similar to prior. There is severely diminished caliber and intraluminal contrast within the intracranial vertebral artery. The left vertebral artery demonstrates intermittent contrast opacification primarily within the V2 segment. The left vertebral artery origins not well visualized. The left V3 segment is patent with poor contrast opacification of the left V4 segment similar to prior. Skeleton: No acute or aggressive finding noted. Other neck: The visualized airway is patent. No cervical lymphadenopathy. Upper chest: Visualized lung apices are clear. Review of the MIP images confirms the above findings CTA HEAD FINDINGS ANTERIOR CIRCULATION: The left internal carotid artery is occluded from the cervical segment to the distal supraclinoid ICA. There is reconstitution at the ICA terminus likely via the circle-of-Willis. The right internal carotid artery is patent to the ICA terminus. Atherosclerosis of the right carotid siphon with moderate stenosis of the supraclinoid ICA. MCAs: Patent bilaterally. Mild narrowing at the origin of the left M1 segment similar to prior. Moderate stenosis of a proximal M2 inferior division branch of the left MCA. ACAs: Patent bilaterally. Moderate narrowing of the left A2 segment. POSTERIOR CIRCULATION: Small caliber basilar artery similar to prior without evidence of focal occlusion. There is poor contrast opacification of both PCAs similar to prior. The SCA is are visualized proximally. Venous sinuses: As permitted by contrast timing, patent. Anatomic  variants: None Review of the MIP images confirms the above findings IMPRESSION: Occlusion of the left internal carotid artery from the proximal cervical segment to the ICA terminus likely secondary to noncalcified atherosclerotic plaque near the carotid bifurcation. Reconstitution of the ICA terminus via the circle-of-Willis. Multifocal atherosclerosis in the neck. Redemonstrated multifocal occlusion of the left vertebral artery in the neck. Poor contrast opacification of the V3 and V4 segments bilaterally. Similar occlusion of the proximal left subclavian artery with patent left common carotid to left subclavian bypass noted. Intracranial atherosclerotic disease as above. Moderate stenosis of the right supraclinoid ICA. Focal moderate stenosis of a proximal M2 branch of the left MCA. Moderate stenosis of the A2 segment left ACA. These results were called by telephone at the time of interpretation on 05/27/2024 at 4:15 pm to provider Dr. Liam Redhead, who verbally acknowledged these results. Electronically Signed   By: Denny Flack M.D.   On: 05/27/2024 16:16   CT HEAD CODE STROKE WO CONTRAST Result Date: 05/27/2024 CLINICAL DATA:  Code stroke. Neuro deficit, concern for stroke, right-sided weakness. EXAM: CT HEAD WITHOUT CONTRAST TECHNIQUE: Contiguous axial images were obtained from the base of the skull through the vertex without intravenous contrast. RADIATION DOSE REDUCTION: This exam was performed according to the departmental dose-optimization program which includes automated exposure control, adjustment of the mA and/or kV according to patient size and/or use of iterative reconstruction technique. COMPARISON:  CT head and CTA head and neck 03/31/2023. FINDINGS: Brain: No acute intracranial hemorrhage. Similar appearance of encephalomalacia throughout the right cerebral hemisphere particularly in the frontoparietal lobes and superior left occipital lobe. Additional remote infarcts in the bilateral cerebellum.  Nonspecific hypoattenuation in the periventricular and subcortical white matter favored to reflect chronic microvascular ischemic changes. No edema, mass effect, or midline shift. The basilar cisterns are patent. Ventricles: Slight ex vacuo dilatation of the right lateral ventricle. No hydrocephalus. Vascular: Atherosclerosis in the right carotid siphon.  Redemonstrated stent in the left carotid siphon. Skull: No acute or aggressive finding. Orbits: Orbits are symmetric. Sinuses: Mucosal thickening in the ethmoid sinuses. Other: Mastoid air cells are clear. ASPECTS [facility] Stroke Program Early CT Score) - Ganglionic level infarction (caudate, lentiform nuclei, internal capsule, insula, M1-M3 cortex): 7 - Supraganglionic infarction (M4-M6 cortex): 3 Total score (0-10 with 10 being normal): 10 IMPRESSION: 1. No CT evidence of acute intracranial abnormality. 2. Similar remote infarcts in the left cerebral hemisphere and bilateral cerebellum. Chronic microvascular ischemic changes. 3. ASPECTS is 10 These results were called by telephone at the time of interpretation on 05/27/2024 at 3:22 pm to provider Dorenda Gandy , who verbally acknowledged these results. Electronically Signed   By: Denny Flack M.D.   On: 05/27/2024 15:22    Vitals:   05/28/24 1145 05/28/24 1200 05/28/24 1230 05/28/24 1300  BP: 118/61 104/62 (!) 119/59 130/61  Pulse: 91 90 90 100  Resp:      Temp:      TempSrc:      SpO2: 98% 98% 98%   Weight:         PHYSICAL EXAM General:  Alert, well-nourished, well-developed patient in no acute distress Psych:  Mood and affect appropriate for situation CV: Regular rate and rhythm on monitor Respiratory: Respirations synchronous with ventilator   NEURO (sedation with low-dose propofol  and fentanyl ): Patient does not open eyes to voice or follow commands.  Pupils equal round and reactive to light, oculocephalic reflex absent, will withdraw all extremities to noxious stimuli.  Patient has  antigravity movements in the left upper and lower extremity to sternal rub.  She has brisk withdrawal in right lower extremity but only trace movement in the right upper extremity to sternal rub.   ASSESSMENT/PLAN  [name] is a 58 y.o. female with history of smoking, left carotid subclavian bypass graft, right lower lung mass, COPD, chronic pancreatitis, depression, GERD, hyperlipidemia, hypertension and previous stroke admitted for acute onset right-sided weakness and aphasia.  Patient was given TNK at [facility] and was transferred here for thrombectomy.  Thrombectomy was successful with revascularization of left ICA and left M1 segment achieved with 3 passes.  NIH on Admission 18  Acute Ischemic Infarct:  left MCA territory stroke due to left distal ICA stent and MCA occlusion s/p mechanical thrombectomy and TNK Etiology: Large vessel occlusion- left distal ICA stent occlusion Code Stroke CT head No acute abnormality. ASPECTS 10.    CTA head & neck occlusion of the left ICA from proximal cervical segment to ICA terminus, left common carotid to left subclavian bypass noted, moderate stenosis of right supraclinoid ICA, focal moderate stenosis of proximal M2 branch of left MCA, moderate stenosis of A2 segment of left ACA Post IR CT no acute abnormality MRI pending 2D Echo EF 65 to 70%, grade 1 diastolic dysfunction, no interatrial shunt, normal left atrial size LDL UNABLE TO CALCULATE IF TRIGLYCERIDE OVER 400 mg/dL, direct LDL pending [id number] 6.3 VTE prophylaxis -SCDs aspirin  81 mg daily and Brilinta  (ticagrelor ) 90 mg bid prior to admission, now on No antithrombotic she is less than 24 hours from TNK administration Therapy recommendations:  Pending Disposition: Pending  Hx of Stroke/TIA Patient has a history of TIA as well as previous stroke in the left cerebral hemisphere and cerebellum  Hypertension Home meds: None Unstable, requiring nicardipine Blood Pressure Goal:  SBP 120-160 for first 24 hours then less than 180   Hyperlipidemia Home meds: Atorvastatin  80 mg daily  and ezetimibe  10 mg daily, resumed in hospital Direct LDL pending Continue statin at discharge  Diabetes type II Controlled Home meds: None HgbA1c 6.3, goal < 7.0 CBGs SSI Recommend close follow-up with PCP for better DM control  Tobacco Abuse Patient smokes 0.5 packs per day for 41 years Will assess readiness to quit when patient extubated  Substance Abuse Patient uses cocaine and THC UDS positive for  THC  Cocaine Will assess readiness to quit when patient extubated TOC consult for cessation placed  Dysphagia Patient has post-stroke dysphagia, SLP consulted    Diet   Diet NPO time specified   Advance diet as tolerated  Respiratory failure Patient left intubated after procedure Ventilator management per CCM Extubate when able  Other Stroke Risk Factors Obesity, Body mass index is 35.22 kg/m., BMI >/= 30 associated with increased stroke risk, recommend weight loss, diet and exercise as appropriate    Other Active Problems None  Hospital day # 1  Patient seen by NP with MD, MD to edit note as needed. Cortney E Bucky Cardinal , MSN, AGACNP-BC [facility] See Amion for schedule and pager information 05/28/2024 1:34 PM   I have personally obtained history,examined this patient, reviewed notes, independently viewed imaging studies, participated in medical decision making and plan of care.ROS completed by me personally and pertinent positives fully documented  I have made any additions or clarifications directly to the above note. Agree with note above.  Patient with known history of left hemispheric infarcts due to symptomatic terminal left carotid stenosis who had undergone elective left dominant carotid angioplasty and stenting in September 2023 presented with sudden onset of aphasia and right hemiparesis with left ICA occlusion and was treated with IV TNK  followed by successful mechanical thrombectomy with good revascularization.  Patient remains sedated and intubated.  Recommend close neurological observation and strict blood pressure control as per post TNK protocol.  Wean off sedation as tolerated and extubate as per critical care team.  Resume antiplatelet therapy 24 hours after TNK.  Continue ongoing stroke workup and aggressive risk factor modification.  No family available at the bedside.  Discussed with Dr. Alvira Josephs interventional neuroradiology.  Discussed with critical care team. This patient is critically ill and at significant risk of neurological worsening, death and care requires constant monitoring of vital signs, hemodynamics,respiratory and cardiac monitoring, extensive review of multiple databases, frequent neurological assessment, discussion with family, other specialists and medical decision making of high complexity.I have made any additions or clarifications directly to the above note.This critical care time does not reflect procedure time, or teaching time or supervisory time of PA/NP/Med Resident etc but could involve care discussion time.  I spent 30 minutes of neurocritical care time  in the care of  this patient.     Ardella Beaver, MD Medical Director [facility] Pager: [phone] 05/28/2024 4:18 PM  To contact Stroke Continuity provider, please refer to [email or web address]. After hours, contact General Neurology

## 2024-05-28 NOTE — Progress Notes (Signed)
*  PRELIMINARY RESULTS* Echocardiogram 2D Echocardiogram has been performed.  Nassir Neidert E Brien Lowe 05/28/2024, 10:00 AM

## 2024-05-29 ENCOUNTER — Inpatient Hospital Stay (HOSPITAL_COMMUNITY)

## 2024-05-29 DIAGNOSIS — I6522 Occlusion and stenosis of left carotid artery: Secondary | ICD-10-CM | POA: Diagnosis not present

## 2024-05-29 DIAGNOSIS — I69391 Dysphagia following cerebral infarction: Secondary | ICD-10-CM | POA: Diagnosis not present

## 2024-05-29 DIAGNOSIS — F17210 Nicotine dependence, cigarettes, uncomplicated: Secondary | ICD-10-CM | POA: Diagnosis not present

## 2024-05-29 DIAGNOSIS — I63512 Cerebral infarction due to unspecified occlusion or stenosis of left middle cerebral artery: Secondary | ICD-10-CM | POA: Diagnosis not present

## 2024-05-29 DIAGNOSIS — R739 Hyperglycemia, unspecified: Secondary | ICD-10-CM

## 2024-05-29 LAB — GLUCOSE, CAPILLARY
Glucose-Capillary: 130 mg/dL — ABNORMAL HIGH (ref 70–99)
Glucose-Capillary: 146 mg/dL — ABNORMAL HIGH (ref 70–99)
Glucose-Capillary: 157 mg/dL — ABNORMAL HIGH (ref 70–99)
Glucose-Capillary: 159 mg/dL — ABNORMAL HIGH (ref 70–99)
Glucose-Capillary: 163 mg/dL — ABNORMAL HIGH (ref 70–99)
Glucose-Capillary: 95 mg/dL (ref 70–99)

## 2024-05-29 LAB — BASIC METABOLIC PANEL WITH GFR
Anion gap: 11 (ref 5–15)
BUN: 19 mg/dL (ref 6–20)
CO2: 26 mmol/L (ref 22–32)
Calcium: 8.1 mg/dL — ABNORMAL LOW (ref 8.9–10.3)
Chloride: 107 mmol/L (ref 98–111)
Creatinine, Ser: 0.83 mg/dL (ref 0.44–1.00)
GFR, Estimated: >60 mL/min (ref >60)
Glucose, Bld: 141 mg/dL — ABNORMAL HIGH (ref 70–99)
Potassium: 2.9 mmol/L — ABNORMAL LOW (ref 3.5–5.1)
Sodium: 144 mmol/L (ref 135–145)

## 2024-05-29 LAB — MAGNESIUM
Magnesium: 1.7 mg/dL (ref 1.7–2.4)
Magnesium: 2 mg/dL (ref 1.7–2.4)

## 2024-05-29 LAB — PHOSPHORUS
Phosphorus: 2.7 mg/dL (ref 2.5–4.6)
Phosphorus: 1.5 mg/dL — ABNORMAL LOW (ref 2.5–4.6)

## 2024-05-29 LAB — LDL CHOLESTEROL, DIRECT: Direct LDL: 97 mg/dL (ref 0–99)

## 2024-05-29 MED ORDER — ARFORMOTEROL TARTRATE 15 MCG/2ML IN NEBU
15.0000 ug | INHALATION_SOLUTION | Freq: Two times a day (BID) | RESPIRATORY_TRACT | Status: DC
  Administered 2024-05-29 – 2024-06-05 (×15): 15 ug via RESPIRATORY_TRACT
  Filled 2024-05-29 (×16): qty 2

## 2024-05-29 MED ORDER — POTASSIUM CHLORIDE 20 MEQ PO PACK
40.0000 meq | PACK | ORAL | Status: AC
  Administered 2024-05-29 (×3): 40 meq
  Filled 2024-05-29 (×3): qty 2

## 2024-05-29 MED ORDER — MAGNESIUM SULFATE 2 GM/50ML IV SOLN
2.0000 g | Freq: Once | INTRAVENOUS | Status: AC
  Administered 2024-05-29: 2 g via INTRAVENOUS
  Filled 2024-05-29: qty 50

## 2024-05-29 MED ORDER — ORAL CARE MOUTH RINSE
15.0000 mL | OROMUCOSAL | Status: DC
  Administered 2024-05-29 – 2024-05-30 (×5): 15 mL via OROMUCOSAL

## 2024-05-29 MED ORDER — REVEFENACIN 175 MCG/3ML IN SOLN
175.0000 ug | Freq: Every day | RESPIRATORY_TRACT | Status: DC
  Administered 2024-05-29 – 2024-06-05 (×8): 175 ug via RESPIRATORY_TRACT
  Filled 2024-05-29 (×8): qty 3

## 2024-05-29 MED ORDER — ENOXAPARIN SODIUM 40 MG/0.4ML IJ SOSY
40.0000 mg | PREFILLED_SYRINGE | Freq: Every day | INTRAMUSCULAR | Status: DC
  Administered 2024-05-29 – 2024-06-04 (×7): 40 mg via SUBCUTANEOUS
  Filled 2024-05-29 (×7): qty 0.4

## 2024-05-29 MED ORDER — BUDESONIDE 0.5 MG/2ML IN SUSP
0.5000 mg | Freq: Two times a day (BID) | RESPIRATORY_TRACT | Status: DC
  Administered 2024-05-29 – 2024-06-05 (×15): 0.5 mg via RESPIRATORY_TRACT
  Filled 2024-05-29 (×16): qty 2

## 2024-05-29 NOTE — Evaluation (Signed)
 Clinical/Bedside Swallow Evaluation Patient Details  Name: Rita [name] MRN: [id number] Date of Birth: 1966/06/03  Today's Date: 05/29/2024 Time: SLP Start Time (ACUTE ONLY): 1025 SLP Stop Time (ACUTE ONLY): 1033 SLP Time Calculation (min) (ACUTE ONLY): 8 min  Past Medical History:  Past Medical History:  Diagnosis Date   Arthritis    "qwhere" (02/22/2018)   Barrett's esophagus with esophagitis 03/26/2013   Chronic abdominal pain    Chronic lower back pain    Chronic pancreatitis (HCC)    COPD (chronic obstructive pulmonary disease) (HCC)    Depression    Dyspnea    GERD (gastroesophageal reflux disease)    Heart murmur    Hiatal hernia    High cholesterol    Hypertension    Peptic ulcer    Pneumonia 2000s X 1   Stroke (HCC)    Tobacco use 03/26/2013   Past Surgical History:  Past Surgical History:  Procedure Laterality Date   AORTIC ARCH ANGIOGRAPHY N/A 01/19/2018   Procedure: AORTIC ARCH ANGIOGRAPHY;  Surgeon: Richrd Char, MD;  [facility];  Service: Cardiovascular;  Laterality: N/A;   BIOPSY  04/17/2017   Procedure: BIOPSY;  Surgeon: Suzette Espy, MD;  Location: [facility];  Service: Endoscopy;;  esophageal   CARDIAC CATHETERIZATION     CAROTID-SUBCLAVIAN BYPASS GRAFT Left 03/19/2018   Procedure: BYPASS GRAFT CAROTID-SUBCLAVIAN;  Surgeon: Richrd Char, MD;  Location: [facility] OR;  Service: Vascular;  Laterality: Left;   CESAREAN SECTION  1985; 1988   COLONOSCOPY WITH PROPOFOL  N/A 04/17/2017   Procedure: COLONOSCOPY WITH PROPOFOL ;  Surgeon: Suzette Espy, MD;  Location: [facility];  Service: Endoscopy;  Laterality: N/A;  100   ESOPHAGOGASTRODUODENOSCOPY  Oct 2011   Dr. Homero Luster: ulcer in distal esophagus, soft stricture at GE junction s/p balloon dilation PATH: BARRETT'S   ESOPHAGOGASTRODUODENOSCOPY (EGD) WITH ESOPHAGEAL DILATION N/A 03/27/2013   Procedure: ESOPHAGOGASTRODUODENOSCOPY (EGD) WITH ESOPHAGEAL DILATION;  Surgeon: Suzette Espy, MD;   [facility]: [facility];  Service: Endoscopy;  Laterality: N/A;  possible dilation   ESOPHAGOGASTRODUODENOSCOPY (EGD) WITH PROPOFOL  N/A 01/02/2017   Procedure: ESOPHAGOGASTRODUODENOSCOPY (EGD) WITH PROPOFOL ;  Surgeon: Suzette Espy, MD;  [facility]: [facility];  Service: Endoscopy;  Laterality: N/A;  with possible esophageal dilation   ESOPHAGOGASTRODUODENOSCOPY (EGD) WITH PROPOFOL  N/A 04/17/2017   Procedure: ESOPHAGOGASTRODUODENOSCOPY (EGD) WITH PROPOFOL ;  Surgeon: Suzette Espy, MD;  [facility]: [facility];  Service: Endoscopy;  Laterality: N/A;   EUS N/A 05/09/2013   Procedure: UPPER ENDOSCOPIC ULTRASOUND (EUS) LINEAR;  Surgeon: Janel Medford, MD;  Location: [facility];  Service: Endoscopy;  Laterality: N/A;   FRACTURE SURGERY     pt. denies   IR ANGIO INTRA EXTRACRAN SEL COM CAROTID INNOMINATE BILAT MOD SED  09/06/2022   IR ANGIO INTRA EXTRACRAN SEL COM CAROTID INNOMINATE BILAT MOD SED  09/13/2022   IR ANGIO VERTEBRAL SEL SUBCLAVIAN INNOMINATE BILAT MOD SED  09/13/2022   IR ANGIO VERTEBRAL SEL SUBCLAVIAN INNOMINATE UNI R MOD SED  09/06/2022   IR ANGIOGRAM FOLLOW UP STUDY  09/13/2022   IR CT HEAD LTD  09/08/2022   IR CT HEAD LTD  05/27/2024   IR CT HEAD LTD  05/27/2024   IR NEURO EACH ADD'L AFTER BASIC UNI LEFT (MS)  09/13/2022   IR PATIENT EVAL TECH 0-60 MINS  05/28/2024   IR PERCUTANEOUS ART THROMBECTOMY/INFUSION INTRACRANIAL INC DIAG ANGIO  05/27/2024   IR TRANSCATH/EMBOLIZ  09/08/2022   IR US  GUIDE VASC  ACCESS RIGHT  09/08/2022   IR US  GUIDE VASC ACCESS RIGHT  09/06/2022   KNEE ARTHROSCOPY Right    LAPAROSCOPIC CHOLECYSTECTOMY     LEFT HEART CATH AND CORONARY ANGIOGRAPHY N/A 02/26/2018   Procedure: LEFT HEART CATH AND CORONARY ANGIOGRAPHY;  Surgeon: Odie Benne, MD;  [facility];  Service: Cardiovascular;  Laterality: N/A;   MALONEY DILATION N/A 04/17/2017   Procedure: Londa Rival DILATION;  Surgeon: Suzette Espy, MD;  [facility];  Service: Endoscopy;   Laterality: N/A;   PATELLA FRACTURE SURGERY Left    POLYPECTOMY  04/17/2017   Procedure: POLYPECTOMY;  Surgeon: Suzette Espy, MD;  [facility]: [facility];  Service: Endoscopy;;   RADIOLOGY WITH ANESTHESIA N/A 09/08/2022   Procedure: L ICA Aneurysm Embolazation;  Surgeon: Luellen Sages, MD;  Location: [facility];  Service: Radiology;  Laterality: N/A;   RADIOLOGY WITH ANESTHESIA N/A 05/27/2024   Procedure: RADIOLOGY WITH ANESTHESIA;  Surgeon: Radiologist, Medication, MD;  Location: [facility];  Service: Radiology;  Laterality: N/A;   TEE WITHOUT CARDIOVERSION N/A 01/23/2019   Procedure: TRANSESOPHAGEAL ECHOCARDIOGRAM (TEE);  Surgeon: Hugh Madura, MD;  Location: [facility] ENDOSCOPY;  Service: Cardiovascular;  Laterality: N/A;   TUBAL LIGATION  1992   UPPER EXTREMITY ANGIOGRAPHY N/A 01/19/2018   Procedure: UPPER EXTREMITY ANGIOGRAPHY;  Surgeon: Richrd Char, MD;  [facility];  Service: Cardiovascular;  Laterality: N/A;   HPI:  Rita [name] is a 58 yo female presenting to [facility] with R sided weakness and aphasia. CTA showed occlusion of the L ICA, L vertebral artery and L subclavian artery occlusions. TNK administered and transferred to [facility] for thrombectomy. Remained intubated after the procedure, 6/2-6/4. MRI shows large L MCA ischemic infarct and nonhemorrhagic L lentiform nucleus and periventricular white matter infarcts. PMH includes HTN, HLD, prior CVA s/p L carotid subclavian bypass graft, COPD, tobacco use, GERD with Barrett's esophagus, chronic pancreatitis, chronic opioid use, depression    Assessment / Plan / Recommendation  Clinical Impression  Pt was recently extubated and although pt did not initiate verbal output, her spontaneous cough is forceful and mobilizes secretions, which she is able to expectorate. She does not follow commands. Noted mild R CN VII impairment. Provided trials of ice chips and thin liquids after thorough oral care, which resulted in immediate coughing. Pt  is attentive to POs and initiates prompt oral clearance and swallow response but swallows multiple times and often grimaces simultaneously. Coughing does not recur with purees but multiple swallows persist regardless of consistency. She is emotionally labile, which limited trials overall. Feel pt may benefit from more time post-extubation before proceeding with an instrumental swallow study, which will likely be necessary prior to intiaiting a diet. Recommend she remain NPO except ice chips in moderation after oral care and meds crushed in puree. SLP will f/u as able for further assessment. SLP Visit Diagnosis: Dysphagia, unspecified (R13.10)    Aspiration Risk  Moderate aspiration risk    Diet Recommendation NPO except meds;Ice chips PRN after oral care    Medication Administration: Crushed with puree    Other  Recommendations Oral Care Recommendations: Oral care QID;Oral care prior to ice chip/H20;Staff/trained caregiver to provide oral care    Recommendations for follow up therapy are one component of a multi-disciplinary discharge planning process, led by the attending physician.  Recommendations may be updated based on patient status, additional functional criteria and insurance authorization.  Follow up Recommendations Acute inpatient rehab (3hours/day)  Assistance Recommended at Discharge    Functional Status Assessment Patient has had a recent decline in their functional status and demonstrates the ability to make significant improvements in function in a reasonable and predictable amount of time.  Frequency and Duration min 2x/week  2 weeks       Prognosis Prognosis for improved oropharyngeal function: Good Barriers to Reach Goals: Language deficits;Severity of deficits      Swallow Study   General HPI: Rita [name] is a 58 yo female presenting to [facility] with R sided weakness and aphasia. CTA showed occlusion of the L ICA, L vertebral artery and L subclavian artery  occlusions. TNK administered and transferred to [facility] for thrombectomy. Remained intubated after the procedure, 6/2-6/4. MRI shows large L MCA ischemic infarct and nonhemorrhagic L lentiform nucleus and periventricular white matter infarcts. PMH includes HTN, HLD, prior CVA s/p L carotid subclavian bypass graft, COPD, tobacco use, GERD with Barrett's esophagus, chronic pancreatitis, chronic opioid use, depression Type of Study: Bedside Swallow Evaluation Previous Swallow Assessment: none in chart Diet Prior to this Study: NPO Temperature Spikes Noted: No Respiratory Status: Nasal cannula History of Recent Intubation: Yes Total duration of intubation (days): 3 days Date extubated: 05/29/24 Behavior/Cognition: Alert;Cooperative;Requires cueing Oral Cavity Assessment: Within Functional Limits Oral Care Completed by SLP: Yes Oral Cavity - Dentition: Edentulous Vision: Functional for self-feeding Self-Feeding Abilities: Total assist Patient Positioning: Upright in bed Baseline Vocal Quality: Not observed Volitional Cough: Strong;Congested Volitional Swallow: Able to elicit    Oral/Motor/Sensory Function Overall Oral Motor/Sensory Function: Mild impairment Facial ROM: Reduced right;Suspected CN VII (facial) dysfunction Facial Symmetry: Abnormal symmetry right;Suspected CN VII (facial) dysfunction Lingual ROM: Reduced right;Suspected CN XII (hypoglossal) dysfunction Lingual Symmetry: Abnormal symmetry right;Suspected CN XII (hypoglossal) dysfunction   Ice Chips Ice chips: Impaired Presentation: Spoon Pharyngeal Phase Impairments: Multiple swallows;Cough - Immediate   Thin Liquid Thin Liquid: Impaired Presentation: Spoon Pharyngeal  Phase Impairments: Multiple swallows;Cough - Immediate    Nectar Thick Nectar Thick Liquid: Not tested   Honey Thick Honey Thick Liquid: Not tested   Puree Puree: Within functional limits   Solid     Solid: Not tested      Amil Kale, M.A.,  CCC-SLP Speech Language Pathology, Acute Rehabilitation Services  Secure Chat preferred [phone]  05/29/2024,11:28 AM

## 2024-05-29 NOTE — Progress Notes (Addendum)
 NAME:  Rita [name], MRN:  [id number], DOB:  1966/02/18, LOS: 2 ADMISSION DATE:  05/27/2024 CONSULTATION DATE:  Code Stroke, post-procedure vent management   History of Present Illness:  58 year old woman who presented to [facility] as a transfer from [facility] 6/2 with R-sided deficits. LKW 1415. PMHx significant for HTN, HLD, CVA, s/p L carotid subclavian bypass graft (on DAPT with Brilinta /ASA), COPD, tobacco use, GERD with Barrett's esophagus, chronic pancreatitis, chronic opioid use (on buprenorphine ), depression.  Patient presented to [facility] ED as a Code Stroke with R-sided deficits. LKW 1415. CT Head NAICA with remote infarcts in L cerebral hemisphere/bilateral cerebellum, chronic microvascular changes. CTA Head/Neck demonstrated occlusion of the L ICA from the proximal cervical segment to the ICA terminus and multifocal atherosclerosis within the neck as well as occlusion of L vertebral artery in the neck/similar occlusion of proximal L subclavian artery. TNK given 1544. NIR activated.  Patient was taken to [facility] where she had L common carotid arteriogram with complete revascularization of occluded distal L ICA supraclinoid segment and L M1 segment, with 3 passes achieving TICI 3 revascularization.  Pertinent Medical History:   Past Medical History:  Diagnosis Date   Arthritis    "qwhere" (02/22/2018)   Barrett's esophagus with esophagitis 03/26/2013   Chronic abdominal pain    Chronic lower back pain    Chronic pancreatitis (HCC)    COPD (chronic obstructive pulmonary disease) (HCC)    Depression    Dyspnea    GERD (gastroesophageal reflux disease)    Heart murmur    Hiatal hernia    High cholesterol    Hypertension    Peptic ulcer    Pneumonia 2000s X 1   Stroke (HCC)    Tobacco use 03/26/2013   Significant Hospital Events: Including procedures, antibiotic start and stop dates in addition to other pertinent events   6/2 - Presented as Code Stroke. LKW 1415. CT Head NAICA. CTA Head/Neck  demonstrated occlusion of the L ICA from the proximal cervical segment to the ICA terminus and multifocal atherosclerosis within the neck as well as occlusion of L vertebral artery in the neck/similar occlusion of proximal L subclavian artery. Taken to [facility]. PCCM consulted for post-procedure vent management. 6/3 Remained intubated post-NIR revascularization of occluded flow-diverter stent (L ICA) and L MCA M1.  Echo 6/3 with EF 65-70%, +hyperdynamic LV, G1DD, normal RV function, mildly elevated PASP (RVSP 37.1), no MR, +increased aortic velocities; negative bubble study. Repeat CT Head demonstrated stable remote L occipital/posterior watershed infarcts and stable remote cerebellar infarcts. Cleviprex  transitioned to Cardene in the setting of hypertriglyceridemia. MRI brain 1. No acute hemorrhage.2. Large acute nonhemorrhagic left MCA territory ischemic infarct.3. Acute nonhemorrhagic infarct in the posterior left lentiform nucleus.4. Acute nonhemorrhagic left periventricular white matter infarcts.5. Remote cortical infarcts in the left parietal lobe and along the watershed distribution over the left hemisphere. Remote inferior cerebellar infarcts, right greater than left. 6. Moderate mucosal disease in the sphenoid sinus bilaterally and posterior maxillary sinuses. Mild mucosal thickening in the anterior ethmoid air cells and inferior frontal sinuses. 6/4 extubated. GOC confirmed w/ daughter before hand. Would re-intubate and consider trach if needed.   Interim History / Subjective:  Awake. No distress. VTs excellent on PSV 5  Objective:   Blood pressure (!) 108/52, pulse 82, temperature 98.2 F (36.8 C), temperature source Axillary, resp. rate 17, weight 102 kg, last menstrual period 02/09/2012, SpO2 98%.    Vent Mode: PRVC FiO2 (%):  [40 %] 40 % Set Rate:  [  16 bmp] 16 bmp Vt Set:  [480 mL] 480 mL PEEP:  [8 cmH20] 8 cmH20 Plateau Pressure:  [14 cmH20-20 cmH20] 14 cmH20   Intake/Output Summary  (Last 24 hours) at 05/29/2024 0823 Last data filed at 05/29/2024 0600 Gross per 24 hour  Intake 1912.94 ml  Output 1355 ml  Net 557.94 ml   Filed Weights   05/27/24 1534 05/27/24 1955  Weight: 99.7 kg 102 kg     Physical Examination:  General this is a 58 year old female s/p Left MCA stroke and intervention. VTs look good on SBT w/ no accessory use HENT NCAT no JVD. Orally intubated.  Pulm diffuse wheezing but no accessory use. VTs 500s -600s on PSV 5. Sats 90s.  Card rrr w/out MRG Abd soft Ext warm dry  Neuro opens eyes follows commands intermittently. Right arm/side weaker than left  Gu cl yellow  Assessment & Plan:  Large acute left MCA territory stroke w/ L carotid occlusion  - s/p NIR for L common carotid arteriogram with complete revascularization of occluded distal L ICA supraclinoid segment, and L M1 segment, with 3 passes achieving TICI 3 revascularization.  Hx of CVA and L carotid subclavian bypass graft (on DAPT) plan Serial neuro checks Goal BP < 180 now that she is > 24hrs s/p TNK Antiplatelet therapy with asa and brilinta   She will need a Cortrak  Neuroprotective measures: HOB > 30 degrees, normoglycemia, normothermia, electrolytes WNL PT/OT/SLP when able to participate in care  Respiratory insufficiency post-procedure, requiring mechanical ventilation History of COPD. Passed SBT. Confirmed w/ daughter she would want aggressive short term care which would include re-intubation and possibly trach if felt no long term vent needed Plan Extubate Pulse ox goal > 92 Asp and reflux precautions Adding brovana/yupelri and pulmicort nebs w/ wheezing.   Hypokalemia Plan Replace and recheck   Hyperglycemia  Plan Ssi  Goal 140-180  Hx HTN Hyperlipidemia/hypercholesterolemia Hypertriglyceridemia Plan Resume home statin/Zetia   Hx GERD with Barrett's esophagus (on home Dexilant ). Plan PPI  Hx Chronic opioid use (on buprenorphine  PTA) Cocaine, THC+ on  UDS Depression Tobacco abuse Plan Nicotine  patch TOC consult for cessation resources   Best Practice: (right click and "Reselect all SmartList Selections" daily)   Diet/type: NPO DVT prophylaxis: SCDs GI prophylaxis: PPI Lines: N/A Foley:  N/A Code Status:  full code Last date of multidisciplinary goals of care discussion [Per Primary Team]  Critical care time: 42 min

## 2024-05-29 NOTE — Progress Notes (Signed)

## 2024-05-29 NOTE — Progress Notes (Signed)
 Nutrition Follow-up  DOCUMENTATION CODES:   Not applicable  INTERVENTION:  Tube feeds via cortrak tube:  Osmolite 1.5 @ 55 mL/hr (1320 mL per day) 60 mL ProSource TF20 - Daily  Regimen at goal provides 2060 kcal, 103 gm protein, and 1006 mL free water  daily.   NUTRITION DIAGNOSIS:   Inadequate oral intake related to inability to eat as evidenced by NPO status. Ongoing.   GOAL:   Patient will meet greater than or equal to 90% of their needs Met with TF at goal   MONITOR:   Vent status, Labs, Weight trends, I & O's  REASON FOR ASSESSMENT:   Ventilator    ASSESSMENT:   58 y.o. female presented to [facility] ED with R sided weakness and transferred to [facility]. PMH includes HTN, HLD, CVA, chronic pancreatitis, COPD, GERD, Barrett's esophagus, and tobacco abuse. Pt admitted with L ischemic CVA.   Pt discussed during ICU rounds and with RN and MD.  Failed SLP eval; cortrak placed Pt globally aphasic during assessment Noted plans for CIR  TF remains at goal rate  6/02 - Admitted; s/p L common carotid arteriogram; Intubated 6/04 - extubated; cortrak placed  Nutrition Related Medications: Colace, NovoLog  0-9 units q4h, Protonix , Miralax , Potassium Chloride  x 4 Drips Mag sulfate x 1  Labs:  K 2.9 TG 781 A1C 6.3 CBG: 95-163 mg/dL x 24 hrs    Diet Order:   Diet Order             Diet NPO time specified  Diet effective now                   EDUCATION NEEDS:   Not appropriate for education at this time  Skin:  Skin Assessment: Reviewed RN Assessment  Last BM:  Unknown/PTA  Height:  Ht Readings from Last 1 Encounters:  03/31/23 5\' 7"  (1.702 m)   Weight:  Wt Readings from Last 1 Encounters:  05/27/24 102 kg   Ideal Body Weight:  61.4 kg  BMI:  Body mass index is 35.22 kg/m.  Estimated Nutritional Needs:  Kcal:  1950-2150 Protein:  100-120 grams Fluid:  >/= 2 L  Rita Beem P., RD, LDN, CNSC See AMiON for contact information

## 2024-05-29 NOTE — Procedures (Signed)
 Extubation Procedure Note  Patient Details:   Name: Rita [name] DOB: Nov 12, 1966 MRN: [id number]   Airway Documentation:    Vent end date: 05/29/24 Vent end time: 0939   Evaluation  O2 sats: stable throughout Complications: No apparent complications Patient did tolerate procedure well. Bilateral Breath Sounds: (P) Clear, Diminished   Pt extubated successfully to 4 l/m .  Gari Junior 05/29/2024, 9:39 AM

## 2024-05-29 NOTE — TOC CAGE-AID Note (Signed)
 Transition of Care [facility]) - CAGE-AID Screening   Patient Details  Name: ASHYA NICOLAISEN MRN: [id number] Date of Birth: Oct 20, 1966  Transition of [facility]) CM/SW Contact:    Jannice Mends, LCSW Phone Number: 05/29/2024, 11:19 AM   Clinical Narrative: Patient is disoriented and not appropriate for assessment.   CAGE-AID Screening: Substance Abuse Screening unable to be completed due to: : Patient unable to participate

## 2024-05-29 NOTE — Procedures (Signed)
 Cortrak  Person Inserting Tube:  Perry Brucato T, RD Tube Type:  Cortrak - 43 inches Tube Size:  10 Tube Location:  Left nare Secured by: Bridle Technique Used to Measure Tube Placement:  Marking at nare/corner of mouth Cortrak Secured At:  67 cm   Cortrak Tube Team Note:  Consult received to place a Cortrak feeding tube.   X-ray has been ordered by the Cortrak team. Please confirm placement prior to use of tube.  If the tube becomes dislodged please keep the tube and contact the Cortrak team at [email or web address] for replacement.  If after hours and replacement cannot be delayed, place a NG tube and confirm placement with an abdominal x-ray.    Scheryl Cushing RD, LDN Contact via [employer].

## 2024-05-29 NOTE — Evaluation (Signed)
 Speech Language Pathology Evaluation Patient Details Name: Rita [name] MRN: [id number] DOB: March 11, 1966 Today's Date: 05/29/2024 Time: 7846-9629 SLP Time Calculation (min) (ACUTE ONLY): 8 min  Problem List:  Patient Active Problem List   Diagnosis Date Noted   Middle cerebral artery embolism, left 05/27/2024   Acute hypoxic respiratory failure (HCC) 05/27/2024   Acute CVA (cerebrovascular accident) (HCC) 03/31/2023   Internal carotid artery stenosis, left 09/08/2022   Opioid dependence (HCC) 09/02/2022   Obesity, Class II, BMI 35-39.9 09/02/2022   Lung mass 09/02/2022   Impaired glucose tolerance 09/02/2022   Acute ischemic stroke (HCC) 09/01/2022   Mixed hyperlipidemia 09/01/2022   Cavitating mass in right lower lung lobe (PNA Vs Cancer) 01/21/2019   Gastroenteritis 01/20/2019   Left sided cerebral hemisphere cerebrovascular accident (CVA) (HCC) 01/20/2019   Stenosis of left subclavian artery (HCC) 03/19/2018   Chest pain 02/21/2018   Left subclavian artery occlusion 02/21/2018   Rectal bleeding 03/21/2017   Hiatal hernia with gastroesophageal reflux disease and esophagitis    Biliary pain    Pain of upper abdomen    Elevated liver enzymes    Chronic abdominal pain    Pancreatitis 02/06/2015   Acute pancreatitis    Elevated LFTs    Pancreatitis, acute 05/09/2013   Hypokalemia 03/27/2013   Anemia 03/27/2013   Esophageal dysphagia 03/26/2013   Chronic back pain 03/26/2013   GERD (gastroesophageal reflux disease) 03/26/2013   Tobacco use 03/26/2013   Barrett's esophagus with esophagitis 03/26/2013   Elevated lipase 08/20/2011   Past Medical History:  Past Medical History:  Diagnosis Date   Arthritis    "qwhere" (02/22/2018)   Barrett's esophagus with esophagitis 03/26/2013   Chronic abdominal pain    Chronic lower back pain    Chronic pancreatitis (HCC)    COPD (chronic obstructive pulmonary disease) (HCC)    Depression    Dyspnea    GERD (gastroesophageal reflux  disease)    Heart murmur    Hiatal hernia    High cholesterol    Hypertension    Peptic ulcer    Pneumonia 2000s X 1   Stroke (HCC)    Tobacco use 03/26/2013   Past Surgical History:  Past Surgical History:  Procedure Laterality Date   AORTIC ARCH ANGIOGRAPHY N/A 01/19/2018   Procedure: AORTIC ARCH ANGIOGRAPHY;  Surgeon: Richrd Char, MD;  Location: [facility];  Service: Cardiovascular;  Laterality: N/A;   BIOPSY  04/17/2017   Procedure: BIOPSY;  Surgeon: Suzette Espy, MD;  Location: [facility];  Service: Endoscopy;;  esophageal   CARDIAC CATHETERIZATION     CAROTID-SUBCLAVIAN BYPASS GRAFT Left 03/19/2018   Procedure: BYPASS GRAFT CAROTID-SUBCLAVIAN;  Surgeon: Richrd Char, MD;  Location: [facility] OR;  Service: Vascular;  Laterality: Left;   CESAREAN SECTION  1985; 1988   COLONOSCOPY WITH PROPOFOL  N/A 04/17/2017   Procedure: COLONOSCOPY WITH PROPOFOL ;  Surgeon: Suzette Espy, MD;  Location: [facility];  Service: Endoscopy;  Laterality: N/A;  100   ESOPHAGOGASTRODUODENOSCOPY  Oct 2011   Dr. Homero Luster: ulcer in distal esophagus, soft stricture at GE junction s/p balloon dilation PATH: BARRETT'S   ESOPHAGOGASTRODUODENOSCOPY (EGD) WITH ESOPHAGEAL DILATION N/A 03/27/2013   Procedure: ESOPHAGOGASTRODUODENOSCOPY (EGD) WITH ESOPHAGEAL DILATION;  Surgeon: Suzette Espy, MD;  [facility];  Service: Endoscopy;  Laterality: N/A;  possible dilation   ESOPHAGOGASTRODUODENOSCOPY (EGD) WITH PROPOFOL  N/A 01/02/2017   Procedure: ESOPHAGOGASTRODUODENOSCOPY (EGD) WITH PROPOFOL ;  Surgeon: Suzette Espy, MD;  [facility];  Service: Endoscopy;  Laterality: N/A;  with possible esophageal dilation   ESOPHAGOGASTRODUODENOSCOPY (EGD) WITH PROPOFOL  N/A 04/17/2017   Procedure: ESOPHAGOGASTRODUODENOSCOPY (EGD) WITH PROPOFOL ;  Surgeon: Suzette Espy, MD;  [facility];  Service: Endoscopy;  Laterality: N/A;   EUS N/A 05/09/2013   Procedure: UPPER ENDOSCOPIC ULTRASOUND  (EUS) LINEAR;  Surgeon: Janel Medford, MD;  Location: [facility];  Service: Endoscopy;  Laterality: N/A;   FRACTURE SURGERY     pt. denies   IR ANGIO INTRA EXTRACRAN SEL COM CAROTID INNOMINATE BILAT MOD SED  09/06/2022   IR ANGIO INTRA EXTRACRAN SEL COM CAROTID INNOMINATE BILAT MOD SED  09/13/2022   IR ANGIO VERTEBRAL SEL SUBCLAVIAN INNOMINATE BILAT MOD SED  09/13/2022   IR ANGIO VERTEBRAL SEL SUBCLAVIAN INNOMINATE UNI R MOD SED  09/06/2022   IR ANGIOGRAM FOLLOW UP STUDY  09/13/2022   IR CT HEAD LTD  09/08/2022   IR CT HEAD LTD  05/27/2024   IR CT HEAD LTD  05/27/2024   IR NEURO EACH ADD'L AFTER BASIC UNI LEFT (MS)  09/13/2022   IR PATIENT EVAL TECH 0-60 MINS  05/28/2024   IR PERCUTANEOUS ART THROMBECTOMY/INFUSION INTRACRANIAL INC DIAG ANGIO  05/27/2024   IR TRANSCATH/EMBOLIZ  09/08/2022   IR US  GUIDE VASC ACCESS RIGHT  09/08/2022   IR US  GUIDE VASC ACCESS RIGHT  09/06/2022   KNEE ARTHROSCOPY Right    LAPAROSCOPIC CHOLECYSTECTOMY     LEFT HEART CATH AND CORONARY ANGIOGRAPHY N/A 02/26/2018   Procedure: LEFT HEART CATH AND CORONARY ANGIOGRAPHY;  Surgeon: Odie Benne, MD;  [facility]: [facility];  Service: Cardiovascular;  Laterality: N/A;   MALONEY DILATION N/A 04/17/2017   Procedure: Londa Rival DILATION;  Surgeon: Suzette Espy, MD;  Location: [facility];  Service: Endoscopy;  Laterality: N/A;   PATELLA FRACTURE SURGERY Left    POLYPECTOMY  04/17/2017   Procedure: POLYPECTOMY;  Surgeon: Suzette Espy, MD;  Location: [facility];  Service: Endoscopy;;   RADIOLOGY WITH ANESTHESIA N/A 09/08/2022   Procedure: L ICA Aneurysm Embolazation;  Surgeon: Luellen Sages, MD;  Location: [facility];  Service: Radiology;  Laterality: N/A;   RADIOLOGY WITH ANESTHESIA N/A 05/27/2024   Procedure: RADIOLOGY WITH ANESTHESIA;  Surgeon: Radiologist, Medication, MD;  Location: [facility];  Service: Radiology;  Laterality: N/A;   TEE WITHOUT CARDIOVERSION N/A 01/23/2019   Procedure: TRANSESOPHAGEAL ECHOCARDIOGRAM  (TEE);  Surgeon: Hugh Madura, MD;  Location: [facility] ENDOSCOPY;  Service: Cardiovascular;  Laterality: N/A;   TUBAL LIGATION  1992   UPPER EXTREMITY ANGIOGRAPHY N/A 01/19/2018   Procedure: UPPER EXTREMITY ANGIOGRAPHY;  Surgeon: Richrd Char, MD;  [facility]: [facility];  Service: Cardiovascular;  Laterality: N/A;   HPI:  HENLEY BLYTH is a 58 yo female presenting to [facility] with R sided weakness and aphasia. CTA showed occlusion of the L ICA, L vertebral artery and L subclavian artery occlusions. TNK administered and transferred to [facility] for thrombectomy. Remained intubated after the procedure, 6/2-6/4. MRI shows large L MCA ischemic infarct and nonhemorrhagic L lentiform nucleus and periventricular white matter infarcts. PMH includes HTN, HLD, prior CVA s/p L carotid subclavian bypass graft, COPD, tobacco use, GERD with Barrett's esophagus, chronic pancreatitis, chronic opioid use, depression   Assessment / Plan / Recommendation Clinical Impression  Pt recently extubated and emotionally labile. She presents with deficits related to auditory comprehension and expressive language. No spontaneous verbal output was observed and given deficits related to initiation, was unable to  achieve phonation given Max multimodal cueing. She is unable to follow commands given a model and does not answer yes/no questions. She was attentive to concrete functional tasks. This is suspected to be a significant change from her baseline and she may benefit from ongoing SLP f/u to target deficits listed above. Will continue following for ongoing assessment of expressive and receptive language.    SLP Assessment  SLP Recommendation/Assessment: Patient needs continued Speech Lanaguage Pathology Services SLP Visit Diagnosis: Aphasia (R47.01)    Recommendations for follow up therapy are one component of a multi-disciplinary discharge planning process, led by the attending physician.  Recommendations may be updated based on  patient status, additional functional criteria and insurance authorization.    Follow Up Recommendations  Acute inpatient rehab (3hours/day)    Assistance Recommended at Discharge  Frequent or constant Supervision/Assistance  Functional Status Assessment Patient has had a recent decline in their functional status and demonstrates the ability to make significant improvements in function in a reasonable and predictable amount of time.  Frequency and Duration min 2x/week  2 weeks      SLP Evaluation Cognition  Overall Cognitive Status: Difficult to assess Arousal/Alertness: Awake/alert Orientation Level: Disoriented X4 Attention: Sustained Sustained Attention: Appears intact Problem Solving: Impaired Problem Solving Impairment: Functional basic       Comprehension  Auditory Comprehension Overall Auditory Comprehension: Impaired Yes/No Questions: Impaired Basic Biographical Questions: 0-25% accurate Commands: Impaired One Step Basic Commands: 0-24% accurate    Expression Expression Primary Mode of Expression: Verbal Verbal Expression Overall Verbal Expression: Impaired Initiation: Impaired   Oral / Motor  Oral Motor/Sensory Function Overall Oral Motor/Sensory Function: Mild impairment Facial ROM: Reduced right;Suspected CN VII (facial) dysfunction Facial Symmetry: Abnormal symmetry right;Suspected CN VII (facial) dysfunction Lingual ROM: Reduced right;Suspected CN XII (hypoglossal) dysfunction Lingual Symmetry: Abnormal symmetry right;Suspected CN XII (hypoglossal) dysfunction Motor Speech Overall Motor Speech:  (not observed)            Amil Kale, M.A., CCC-SLP Speech Language Pathology, Acute Rehabilitation Services  Secure Chat preferred [phone]  05/29/2024, 11:40 AM

## 2024-05-29 NOTE — Progress Notes (Addendum)
 STROKE TEAM PROGRESS NOTE    SIGNIFICANT HOSPITAL EVENTS 6/2-patient presented to [facility] with right-sided weakness and aphasia, TNK was administered and patient was transferred here for thrombectomy. TICI 3 revascularization of distal left ICA stent occlusion and MCA achieved, remained intubated after procedure  INTERIM HISTORY/SUBJECTIVE Plan to extubate today, drowsy but arousable.  Aphasic.  Not following commands.  Moving purposefully left more than right. Post extubation pt/ot/slp recommending CIR. Keep in ICU today for monitoring  K- 2.9 -> replaced MRI done yesterday shows large left MCA cortical infarct without significant hemorrhagic transformation of cytotoxic edema OBJECTIVE  CBC    Component Value Date/Time   WBC 16.7 (H) 05/28/2024 0548   RBC 4.21 05/28/2024 0548   HGB 13.3 05/28/2024 0548   HCT 37.3 05/28/2024 0548   PLT 454 (H) 05/28/2024 0548   MCV 88.6 05/28/2024 0548   MCH 31.6 05/28/2024 0548   MCHC 35.7 05/28/2024 0548   RDW 14.0 05/28/2024 0548   LYMPHSABS 0.9 05/28/2024 0548   MONOABS 0.6 05/28/2024 0548   EOSABS 0.0 05/28/2024 0548   BASOSABS 0.0 05/28/2024 0548    BMET    Component Value Date/Time   NA 137 05/28/2024 0548   K 3.2 (L) 05/28/2024 0548   CL 104 05/28/2024 0548   CO2 23 05/28/2024 0548   GLUCOSE 199 (H) 05/28/2024 0548   BUN 11 05/28/2024 0548   CREATININE 0.75 05/28/2024 0548   CREATININE 0.94 02/08/2018 1209   CALCIUM  8.4 (L) 05/28/2024 0548   GFRNONAA >60 05/28/2024 0548    IMAGING past 24 hours MR BRAIN WO CONTRAST Result Date: 05/29/2024 EXAM: MRI BRAIN WITHOUT CONTRAST 05/28/2024 04:35:37 PM TECHNIQUE: Multiplanar multisequence MRI of the head/brain was performed without the administration of intravenous contrast. COMPARISON: CT head without contrast 05/28/2024. MR head without contrast 05/19/2023. CLINICAL HISTORY: Stroke, follow up; 24 hrs post-TNK r/o hemorrhagic conversion. Revascularization of occluded left  internal carotid artery 05/27/2024. FINDINGS: BRAIN AND VENTRICLES: A large acute nonhemorrhagic left MCA territory ischemic infarct is present. Acute nonhemorrhagic infarct is present in the posterior left lentiform nucleus. Acute nonhemorrhagic left periventricular acute white matter infarcts are present. No acute hemorrhage is present. Remote cortical infarcts are present in the left parietal lobe and along the watershed distribution over the left hemisphere. Remote inferior cerebellar infarcts are present, right greater than left. No mass. No midline shift. No hydrocephalus. The sella is unremarkable. Normal flow voids. ORBITS: No acute abnormality. SINUSES AND MASTOIDS: Fluid in the nasopharynx is likely secondary to intubation. Moderate mucosal disease is present in the sphenoid sinus bilaterally and posterior maxillary sinuses. Mild mucosal thickening is present in the anterior ethmoid air cells and inferior frontal sinuses. BONES AND SOFT TISSUES: Normal marrow signal. No acute soft tissue abnormality. IMPRESSION: 1. No acute hemorrhage. 2. Large acute nonhemorrhagic left MCA territory ischemic infarct. 3. Acute nonhemorrhagic infarct in the posterior left lentiform nucleus. 4. Acute nonhemorrhagic left periventricular white matter infarcts. 5. Remote cortical infarcts in the left parietal lobe and along the watershed distribution over the left hemisphere. Remote inferior cerebellar infarcts, right greater than left. 6. Moderate mucosal disease in the sphenoid sinus bilaterally and posterior maxillary sinuses. Mild mucosal thickening in the anterior ethmoid air cells and inferior frontal sinuses. The pertinent results were texted to Dr. Murvin Arthurs via the [facility] system at 5:22am. Electronically signed by: Audree Leas MD 05/29/2024 05:23 AM EDT RP Workstation: [id number]   ECHOCARDIOGRAM COMPLETE BUBBLE STUDY Result Date: 05/28/2024    ECHOCARDIOGRAM REPORT  Patient Name:   Rita [name] Date of  Exam: 05/28/2024 Medical Rec #:  [id number]     Height:       67.0 in Accession #:    [id number]    Weight:       224.9 lb Date of Birth:  1966-07-13     BSA:          2.126 m Patient Age:    57 years      BP:           120/61 mmHg Patient Gender: F             HR:           86 bpm. Exam Location:  Inpatient Procedure: 2D Echo, Color Doppler and Cardiac Doppler (Both Spectral and Color            Flow Doppler were utilized during procedure). Indications:    Stroke  History:        Patient has prior history of Echocardiogram examinations, most                 recent 03/31/2023. Risk Factors:Hypertension.  Sonographer:    Jeralene Mom Referring Phys: [id number] Alphonza Ashing STACK IMPRESSIONS  1. Left ventricular ejection fraction, by estimation, is 65 to 70%. The left ventricle has hyperdynamic function. The left ventricle has no regional wall motion abnormalities. Left ventricular diastolic parameters are consistent with Grade I diastolic dysfunction (impaired relaxation).  2. Right ventricular systolic function is normal. The right ventricular size is normal. There is mildly elevated pulmonary artery systolic pressure. The estimated right ventricular systolic pressure is 37.1 mmHg.  3. The mitral valve is normal in structure. No evidence of mitral valve regurgitation. No evidence of mitral stenosis.  4. Increased aortic valve velocities are at least in part secondary to hyperdynamic state/increased cardiac output. The aortic valve is tricuspid. There is mild thickening of the aortic valve. Aortic valve regurgitation is not visualized. Aortic valve sclerosis is present, with no evidence of aortic valve stenosis.  5. Agitated saline contrast bubble study was negative, with no evidence of any interatrial shunt. FINDINGS  Left Ventricle: Left ventricular ejection fraction, by estimation, is 65 to 70%. The left ventricle has hyperdynamic function. The left ventricle has no regional wall motion abnormalities. The left  ventricular internal cavity size was small. There is no  left ventricular hypertrophy. Left ventricular diastolic parameters are consistent with Grade I diastolic dysfunction (impaired relaxation). Indeterminate filling pressures. Right Ventricle: The right ventricular size is normal. Right vetricular wall thickness was not well visualized. Right ventricular systolic function is normal. There is mildly elevated pulmonary artery systolic pressure. The tricuspid regurgitant velocity  is 2.92 m/s, and with an assumed right atrial pressure of 3 mmHg, the estimated right ventricular systolic pressure is 37.1 mmHg. Left Atrium: Left atrial size was normal in size. Right Atrium: Right atrial size was normal in size. Pericardium: There is no evidence of pericardial effusion. Mitral Valve: The mitral valve is normal in structure. No evidence of mitral valve regurgitation. No evidence of mitral valve stenosis. Tricuspid Valve: The tricuspid valve is normal in structure. Tricuspid valve regurgitation is trivial. Aortic Valve: Increased aortic valve velocities are at least in part secondary to hyperdynamic state/increased cardiac output. The aortic valve is tricuspid. There is mild thickening of the aortic valve. Aortic valve regurgitation is not visualized. Aortic valve sclerosis is present, with no evidence of aortic valve stenosis. Aortic valve mean gradient measures 15.7 mmHg.  Aortic valve peak gradient measures 30.5 mmHg. Aortic valve area, by VTI measures 2.42 cm. Pulmonic Valve: The pulmonic valve was grossly normal. Pulmonic valve regurgitation is not visualized. No evidence of pulmonic stenosis. Aorta: The aortic root is normal in size and structure. IAS/Shunts: No atrial level shunt detected by color flow Doppler. Agitated saline contrast bubble study was negative, with no evidence of any interatrial shunt.  LEFT VENTRICLE PLAX 2D LVIDd:         4.80 cm   Diastology LVIDs:         3.00 cm   LV e' medial:    7.29 cm/s  LV PW:         0.90 cm   LV E/e' medial:  12.5 LV IVS:        1.10 cm   LV e' lateral:   9.90 cm/s LVOT diam:     2.20 cm   LV E/e' lateral: 9.2 LV SV:         113 LV SV Index:   53 LVOT Area:     3.80 cm  LEFT ATRIUM           Index LA diam:      3.80 cm 1.79 cm/m LA Vol (A4C): 45.4 ml 21.36 ml/m  AORTIC VALVE AV Area (Vmax):    2.36 cm AV Area (Vmean):   2.39 cm AV Area (VTI):     2.42 cm AV Vmax:           276.00 cm/s AV Vmean:          180.000 cm/s AV VTI:            0.469 m AV Peak Grad:      30.5 mmHg AV Mean Grad:      15.7 mmHg LVOT Vmax:         171.00 cm/s LVOT Vmean:        113.000 cm/s LVOT VTI:          0.298 m LVOT/AV VTI ratio: 0.64  AORTA Ao Root diam: 2.90 cm MITRAL VALVE                TRICUSPID VALVE MV Area (PHT): 3.31 cm     TR Peak grad:   34.1 mmHg MV Decel Time: 229 msec     TR Vmax:        292.00 cm/s MV E velocity: 91.30 cm/s MV A velocity: 122.00 cm/s  SHUNTS MV E/A ratio:  0.75         Systemic VTI:  0.30 m                             Systemic Diam: 2.20 cm Karyl Paget Croitoru MD Electronically signed by Luana Rumple MD Signature Date/Time: 05/28/2024/1:10:16 PM    Final    IR PATIENT EVAL TECH 0-60 MINS Result Date: 05/28/2024 Cleotis Daily     05/28/2024 10:23 AM Left 4 french venous femoral sheath removed at 1000. Manual pressure applied to obtain hemostasis at 1010. Dressing applied and site reviewed with [facility]. No acute complications. Distal pulses intact.    Vitals:   05/29/24 0700 05/29/24 0740 05/29/24 0745 05/29/24 0754  BP: (!) 99/55  (!) 108/52   Pulse: 71  82   Resp:  17    Temp:    98.2 F (36.8 C)  TempSrc:    Axillary  SpO2: 97%  98%   Weight:  PHYSICAL EXAM General:  Alert, well-nourished, well-developed patient in no acute distress Psych:  Mood and affect appropriate for situation CV: Regular rate and rhythm on monitor Respiratory: Respirations synchronous with ventilator   NEURO (sedation with low-dose propofol  and fentanyl ): Opens  eyes with painful stimuli but no commands.  Pupils equal round and reactive to light, oculocephalic reflex absent, will withdraw all extremities to noxious stimuli.  Patient has antigravity movements in the left upper and lower extremity to sternal rub.  She has brisk withdrawal in right lower extremity but only trace movement in the right upper extremity to sternal rub.   ASSESSMENT/PLAN  Rita [name] is a 58 y.o. female with history of smoking, left carotid subclavian bypass graft, right lower lung mass, COPD, chronic pancreatitis, depression, GERD, hyperlipidemia, hypertension and previous stroke admitted for acute onset right-sided weakness and aphasia.  Patient was given TNK at [facility] and was transferred here for thrombectomy.  Thrombectomy was successful with revascularization of left ICA and left M1 segment achieved with 3 passes.  NIH on Admission 18  Acute Ischemic Infarct:  left MCA territory stroke due to left distal ICA stent and MCA occlusion s/p mechanical thrombectomy and TNK Etiology: Large vessel occlusion- left distal ICA stent occlusion Code Stroke CT head No acute abnormality. ASPECTS 10.    CTA head & neck occlusion of the left ICA from proximal cervical segment to ICA terminus, left common carotid to left subclavian bypass noted, moderate stenosis of right supraclinoid ICA, focal moderate stenosis of proximal M2 branch of left MCA, moderate stenosis of A2 segment of left ACA Post IR CT no acute abnormality MRI pending 2D Echo EF 65 to 70%, grade 1 diastolic dysfunction, no interatrial shunt, normal left atrial size LDL 97 HgbA1c 6.3 VTE prophylaxis -SCDs aspirin  81 mg daily and Brilinta  (ticagrelor ) 90 mg bid prior to admission, now on back on aspirin  and Brilinta    administration Therapy recommendations:  Pending Disposition: Pending  Hx of Stroke/TIA Patient has a history of TIA as well as previous stroke in the left cerebral hemisphere and  cerebellum  Hypertension Home meds: None Unstable, requiring nicardipine Blood Pressure Goal: SBP 120-160 for first 24 hours then less than 180   Hyperlipidemia Home meds: Atorvastatin  80 mg daily and ezetimibe  10 mg daily, resumed in hospital LDL- 97 Continue statin at discharge  Diabetes type II Controlled Home meds: None HgbA1c 6.3, goal < 7.0 CBGs SSI Recommend close follow-up with PCP for better DM control  Tobacco Abuse Patient smokes 0.5 packs per day for 41 years Will assess readiness to quit when patient extubated  Substance Abuse Patient uses cocaine and THC UDS positive for  THC  Cocaine Will assess readiness to quit when patient extubated TOC consult for cessation placed  Dysphagia Patient has post-stroke dysphagia, SLP consulted    Diet   Diet NPO time specified   Advance diet as tolerated  Respiratory failure Patient left intubated after procedure Ventilator management per CCM Extubate when able  Other Stroke Risk Factors Obesity, Body mass index is 35.22 kg/m., BMI >/= 30 associated with increased stroke risk, recommend weight loss, diet and exercise as appropriate   Other Active Problems Hypokalemia 2.9 -> replaced BMP in the morning   Hospital day # 2  Patient seen and examined by NP/APP with MD. MD to update note as needed.   Imogene Mana, DNP, FNP-BC [facility] Pager: [phone]  I have personally obtained history,examined this patient, reviewed notes, independently  viewed imaging studies, participated in medical decision making and plan of care.ROS completed by me personally and pertinent positives fully documented  I have made any additions or clarifications directly to the above note. Agree with note above.  Recommend wean off sedation and extubate as tolerated.  Long discussion over the phone with the patient's daughter about her condition and goals of care and answered questions.  Discussed with Dr. Cleophus Dade critical  care medicine.  Continue aspirin  and Brilinta .  Discussed with Dr. Alvira Josephs. This patient is critically ill and at significant risk of neurological worsening, death and care requires constant monitoring of vital signs, hemodynamics,respiratory and cardiac monitoring, extensive review of multiple databases, frequent neurological assessment, discussion with family, other specialists and medical decision making of high complexity.I have made any additions or clarifications directly to the above note.This critical care time does not reflect procedure time, or teaching time or supervisory time of PA/NP/Med Resident etc but could involve care discussion time.  I spent 30 minutes of neurocritical care time  in the care of  this patient.     Ardella Beaver, MD Medical Director [facility] Pager: [phone] 05/29/2024 2:55 PM   To contact Stroke Continuity provider, please refer to [email or web address]. After hours, contact General Neurology

## 2024-05-29 NOTE — Progress Notes (Signed)
 Extubated earlier today.  Now on Edgard.  Marked expressive aphasia right side still weak Follows commands.  Very emotional. Starts crying when attempts to communicate)  Failed swallow eval-->now has Feeding tube Plan Cont post extubation monitoring  Still high risk for asp

## 2024-05-29 NOTE — Evaluation (Signed)
 Occupational Therapy Evaluation Patient Details Name: Rita [name] MRN: [id number] DOB: 10/09/1966 Today's Date: 05/29/2024   History of Present Illness   58 yo female presenting to [facility] with R sided weakness and aphasia. CTA showed occlusion of the L ICA, L vertebral artery and L subclavian artery occlusions. TNK administered and transferred to [facility] for thrombectomy. Remained intubated after the procedure, 6/2-6/4. MRI shows large L MCA ischemic infarct and nonhemorrhagic L lentiform nucleus and periventricular white matter infarcts. PMH includes HTN, HLD, prior CVA s/p L carotid subclavian bypass graft, COPD, tobacco use, GERD with Barrett's esophagus, chronic pancreatitis, chronic opioid use, depression.     Clinical Impressions PT admitted with L MCA with revascularization. Pt currently with functional limitiations due to the deficits listed below (see OT problem list). Pt from home but no family present to verify prior functional level.  Pt will benefit from skilled OT to increase their independence and safety with adls and balance to allow discharge Patient will benefit from intensive inpatient follow-up therapy, >3 hours/day .      If plan is discharge home, recommend the following:   Two people to help with walking and/or transfers;Two people to help with bathing/dressing/bathroom     Functional Status Assessment   Patient has had a recent decline in their functional status and demonstrates the ability to make significant improvements in function in a reasonable and predictable amount of time.     Equipment Recommendations    (TBA)     Recommendations for Other Services   Speech consult     Precautions/Restrictions   Precautions Precautions: Fall;Other (comment) Recall of Precautions/Restrictions: Impaired Precaution/Restrictions Comments: A-line Restrictions Weight Bearing Restrictions Per Provider Order: No     Mobility Bed Mobility Overal bed mobility:  Needs Assistance Bed Mobility: Supine to Sit, Sit to Supine     Supine to sit: Min assist, +2 for safety/equipment Sit to supine: Min assist   General bed mobility comments: Pt with difficulty following commands for reciprocal scooting    Transfers Overall transfer level: Needs assistance Equipment used: None Transfers: Sit to/from Stand Sit to Stand: Contact guard assist, +2 safety/equipment           General transfer comment: Good power up to standing from edge of bed, CGA (+2 safety) for lines/equipment      Balance Overall balance assessment: Needs assistance Sitting-balance support: Feet supported Sitting balance-Leahy Scale: Good     Standing balance support: No upper extremity supported, During functional activity Standing balance-Leahy Scale: Good                             ADL either performed or assessed with clinical judgement   ADL Overall ADL's : Needs assistance/impaired     Grooming: Maximal assistance Grooming Details (indicate cue type and reason): needed hand over hand             Lower Body Dressing: Total assistance                       Vision   Additional Comments: pt scanning R and L visual fields. pt tracking therapist. need to be further assessed     Perception         Praxis         Pertinent Vitals/Pain Pain Assessment Pain Assessment: Faces Faces Pain Scale: No hurt Pain Intervention(s): Monitored during session, Repositioned     Extremity/Trunk Assessment Upper Extremity  Assessment Upper Extremity Assessment: RUE deficits/detail RUE Deficits / Details: needs visual input and moving digits wrist and elbow shoulder flexion RUE Sensation: decreased light touch;decreased proprioception   Lower Extremity Assessment Lower Extremity Assessment: Defer to PT evaluation   Cervical / Trunk Assessment Cervical / Trunk Assessment: Normal   Communication Communication Communication: Impaired Factors  Affecting Communication: Difficulty expressing self   Cognition Arousal: Alert Behavior During Therapy: Lability Cognition: Difficult to assess                               Following commands: Impaired Following commands impaired: Follows one step commands inconsistently     Cueing  General Comments   Cueing Techniques: Verbal cues;Gestural cues;Tactile cues;Visual cues  HR 86 4L 02 96%   Exercises Exercises: Other exercises Other Exercises Other Exercises: activation of R UE with attempts to reach, clap and weight bear   Shoulder Instructions      Home Living Family/patient expects to be discharged to:: Private residence Living Arrangements: Spouse/significant other (fiance) Available Help at Discharge: Family;Available 24 hours/day Type of Home: House Home Access: Stairs to enter [employer] of Steps: 1 Entrance Stairs-Rails: Right Home Layout: One level     Bathroom Shower/Tub: Chief Strategy Officer: Standard Bathroom Accessibility: Yes How Accessible: Accessible via walker Home Equipment: Cane - single point   Additional Comments: Pt unable to provide      Prior Functioning/Environment Prior Level of Function : Independent/Modified Independent                    OT Problem List: Decreased activity tolerance;Impaired balance (sitting and/or standing);Decreased coordination;Decreased cognition;Decreased safety awareness;Decreased knowledge of use of DME or AE;Decreased knowledge of precautions;Cardiopulmonary status limiting activity;Impaired sensation;Impaired UE functional use   OT Treatment/Interventions: Self-care/ADL training;Therapeutic exercise;Neuromuscular education;Energy conservation;DME and/or AE instruction;Therapeutic activities;Cognitive remediation/compensation;Visual/perceptual remediation/compensation;Patient/family education;Balance training      OT Goals(Current goals can be found in the care  plan section)   Acute Rehab OT Goals Patient Stated Goal: none state or demonstrated OT Goal Formulation: Patient unable to participate in goal setting Time For Goal Achievement: 06/12/24 Potential to Achieve Goals: Good   OT Frequency:  Min 2X/week    Co-evaluation PT/OT/SLP Co-Evaluation/Treatment: Yes Reason for Co-Treatment: Complexity of the patient's impairments (multi-system involvement);For patient/therapist safety;To address functional/ADL transfers;Necessary to address cognition/behavior during functional activity   OT goals addressed during session: ADL's and self-care;Proper use of Adaptive equipment and DME;Strengthening/ROM      AM-PAC OT "6 Clicks" Daily Activity     Outcome Measure Help from another person eating meals?: Total Help from another person taking care of personal grooming?: Total Help from another person toileting, which includes using toliet, bedpan, or urinal?: Total Help from another person bathing (including washing, rinsing, drying)?: Total Help from another person to put on and taking off regular upper body clothing?: Total Help from another person to put on and taking off regular lower body clothing?: Total 6 Click Score: 6   End of Session Equipment Utilized During Treatment: Gait belt Nurse Communication: Mobility status;Precautions  Activity Tolerance: Patient tolerated treatment well Patient left: in bed;with call bell/phone within reach;with bed alarm set  OT Visit Diagnosis: Unsteadiness on feet (R26.81);Muscle weakness (generalized) (M62.81)                Time: 1040-1106 OT Time Calculation (min): 26 min Charges:  OT General Charges $OT Visit: 1 Visit OT Evaluation $  OT Eval Moderate Complexity: 1 Mod   Brynn, OTR/L  Acute Rehabilitation Services Office: [phone] .   Rita [name] 05/29/2024, 5:31 PM

## 2024-05-29 NOTE — Evaluation (Addendum)
 Physical Therapy Evaluation Patient Details Name: Rita [name] MRN: [id number] DOB: 08-07-1966 Today's Date: 05/29/2024  History of Present Illness  Rita [name] is a 58 yo female presenting to [facility] with R sided weakness and aphasia. CTA showed occlusion of the L ICA, L vertebral artery and L subclavian artery occlusions. TNK administered and transferred to [facility] for thrombectomy. Remained intubated after the procedure, 6/2-6/4. MRI shows large L MCA ischemic infarct and nonhemorrhagic L lentiform nucleus and periventricular white matter infarcts. PMH includes HTN, HLD, prior CVA s/p L carotid subclavian bypass graft, COPD, tobacco use, GERD with Barrett's esophagus, chronic pancreatitis, chronic opioid use, depression.  Clinical Impression  Pt admitted with above. Pt presents with gross change in her functional baseline with deficits in right sided strength (distally weaker than proximally), coordination, sensation, communication. Pt with decreased command following due to deficits in comprehension, but able to follow one verbal commands and several gestural commands during evaluation. Pt able to stand from edge of bed x 2 with CGA (+2 safety). Pt requiring assist for bathing while statically standing. Pt becoming emotionally labile with communication impairment. Patient will benefit from intensive inpatient follow-up therapy, >3 hours/day in order to address deficits, maximize functional mobility and decrease caregiver burden. Suspect good progress given age, PLOF, motivation.         If plan is discharge home, recommend the following: A little help with walking and/or transfers;A lot of help with bathing/dressing/bathroom;Assistance with cooking/housework;Assist for transportation;Help with stairs or ramp for entrance;Supervision due to cognitive status   Can travel by private vehicle        Equipment Recommendations None recommended by PT  Recommendations for Other Services  Rehab consult     Functional Status Assessment Patient has had a recent decline in their functional status and demonstrates the ability to make significant improvements in function in a reasonable and predictable amount of time.     Precautions / Restrictions Precautions Precautions: Fall;Other (comment) Recall of Precautions/Restrictions: Impaired Precaution/Restrictions Comments: A-line Restrictions Weight Bearing Restrictions Per Provider Order: No      Mobility  Bed Mobility Overal bed mobility: Needs Assistance Bed Mobility: Supine to Sit, Sit to Supine     Supine to sit: Min assist, +2 for safety/equipment Sit to supine: Min assist   General bed mobility comments: Pt with difficulty following commands for reciprocal scooting    Transfers Overall transfer level: Needs assistance Equipment used: None Transfers: Sit to/from Stand Sit to Stand: Contact guard assist, +2 safety/equipment           General transfer comment: Good power up to standing from edge of bed, CGA (+2 safety) for lines/equipment    Ambulation/Gait               General Gait Details: deferred (pt recently extubated)  Stairs            Wheelchair Mobility     Tilt Bed    Modified Rankin (Stroke Patients Only) Modified Rankin (Stroke Patients Only) Pre-Morbid Rankin Score: No symptoms Modified Rankin: Moderately severe disability     Balance Overall balance assessment: Needs assistance Sitting-balance support: Feet supported Sitting balance-Leahy Scale: Good     Standing balance support: No upper extremity supported, During functional activity Standing balance-Leahy Scale: Good                               Pertinent Vitals/Pain Pain Assessment Pain Assessment: Faces Faces Pain  Scale: No hurt    Home Living Family/patient expects to be discharged to:: Private residence Living Arrangements: Spouse/significant other (fiance)                 Additional Comments:  Pt unable to provide    Prior Function Prior Level of Function : Independent/Modified Independent                     Extremity/Trunk Assessment   Upper Extremity Assessment Upper Extremity Assessment: Defer to OT evaluation    Lower Extremity Assessment Lower Extremity Assessment: RLE deficits/detail RLE Deficits / Details: Difficult to assess due to cognition. Response to deep pain stimulus, able to perform SLR with good quad activation. Unable to wiggle toes. Ankle dorsiflexion ROM WFL RLE Sensation: decreased light touch    Cervical / Trunk Assessment Cervical / Trunk Assessment: Normal  Communication   Communication Communication: Impaired Factors Affecting Communication: Difficulty expressing self    Cognition Arousal: Alert Behavior During Therapy: Lability   PT - Cognitive impairments: Difficult to assess Difficult to assess due to: Impaired communication                     PT - Cognition Comments: Pt with deficits in both comprehension and expression - no verbalizations Following commands: Impaired Following commands impaired: Follows one step commands inconsistently     Cueing Cueing Techniques: Verbal cues, Gestural cues, Tactile cues, Visual cues     General Comments General comments (skin integrity, edema, etc.): HR 86, SpO2 96% on 4L O2, BP 119/64 (81) pre mobility    Exercises     Assessment/Plan    PT Assessment Patient needs continued PT services  PT Problem List Decreased strength;Decreased activity tolerance;Decreased balance;Decreased mobility;Decreased coordination;Decreased cognition;Decreased safety awareness;Impaired sensation       PT Treatment Interventions DME instruction;Gait training;Stair training;Functional mobility training;Therapeutic activities;Therapeutic exercise;Balance training;Patient/family education    PT Goals (Current goals can be found in the Care Plan section)  Acute Rehab PT Goals Patient Stated  Goal: unable PT Goal Formulation: With patient Time For Goal Achievement: 06/12/24 Potential to Achieve Goals: Good    Frequency Min 3X/week     Co-evaluation PT/OT/SLP Co-Evaluation/Treatment: Yes Reason for Co-Treatment: Complexity of the patient's impairments (multi-system involvement);For patient/therapist safety;To address functional/ADL transfers;Necessary to address cognition/behavior during functional activity PT goals addressed during session: Mobility/safety with mobility         AM-PAC PT "6 Clicks" Mobility  Outcome Measure Help needed turning from your back to your side while in a flat bed without using bedrails?: A Little Help needed moving from lying on your back to sitting on the side of a flat bed without using bedrails?: A Little Help needed moving to and from a bed to a chair (including a wheelchair)?: A Little Help needed standing up from a chair using your arms (e.g., wheelchair or bedside chair)?: A Little Help needed to walk in hospital room?: A Little Help needed climbing 3-5 steps with a railing? : A Lot 6 Click Score: 17    End of Session Equipment Utilized During Treatment: Oxygen Activity Tolerance: Patient tolerated treatment well Patient left: in bed;with call bell/phone within reach;with nursing/sitter in room Nurse Communication: Mobility status PT Visit Diagnosis: Unsteadiness on feet (R26.81);Hemiplegia and hemiparesis Hemiplegia - Right/Left: Right Hemiplegia - dominant/non-dominant: Dominant Hemiplegia - caused by: Cerebral infarction    Time: 1610-9604 PT Time Calculation (min) (ACUTE ONLY): 23 min   Charges:   PT Evaluation $PT Eval  Moderate Complexity: 1 Mod   PT General Charges $$ ACUTE PT VISIT: 1 Visit         Verdia Glad, PT, DPT Acute Rehabilitation Services Office [phone]   Claria Crofts 05/29/2024, 12:39 PM

## 2024-05-30 DIAGNOSIS — J449 Chronic obstructive pulmonary disease, unspecified: Secondary | ICD-10-CM

## 2024-05-30 DIAGNOSIS — I639 Cerebral infarction, unspecified: Secondary | ICD-10-CM | POA: Diagnosis not present

## 2024-05-30 DIAGNOSIS — R339 Retention of urine, unspecified: Secondary | ICD-10-CM

## 2024-05-30 DIAGNOSIS — I63512 Cerebral infarction due to unspecified occlusion or stenosis of left middle cerebral artery: Secondary | ICD-10-CM | POA: Diagnosis not present

## 2024-05-30 LAB — GLUCOSE, CAPILLARY
Glucose-Capillary: 124 mg/dL — ABNORMAL HIGH (ref 70–99)
Glucose-Capillary: 151 mg/dL — ABNORMAL HIGH (ref 70–99)
Glucose-Capillary: 144 mg/dL — ABNORMAL HIGH (ref 70–99)
Glucose-Capillary: 164 mg/dL — ABNORMAL HIGH (ref 70–99)
Glucose-Capillary: 162 mg/dL — ABNORMAL HIGH (ref 70–99)
Glucose-Capillary: 144 mg/dL — ABNORMAL HIGH (ref 70–99)

## 2024-05-30 LAB — CBC
HCT: 37.8 % (ref 36.0–46.0)
Hemoglobin: 12.4 g/dL (ref 12.0–15.0)
MCH: 30.4 pg (ref 26.0–34.0)
MCHC: 32.8 g/dL (ref 30.0–36.0)
MCV: 92.6 fL (ref 80.0–100.0)
Platelets: 386 10*3/uL (ref 150–400)
RBC: 4.08 MIL/uL (ref 3.87–5.11)
RDW: 14.9 % (ref 11.5–15.5)
WBC: 11.9 10*3/uL — ABNORMAL HIGH (ref 4.0–10.5)
nRBC: 0 % (ref 0.0–0.2)

## 2024-05-30 LAB — BASIC METABOLIC PANEL WITH GFR
Anion gap: 13 (ref 5–15)
BUN: 17 mg/dL (ref 6–20)
CO2: 23 mmol/L (ref 22–32)
Calcium: 8.9 mg/dL (ref 8.9–10.3)
Chloride: 107 mmol/L (ref 98–111)
Creatinine, Ser: 0.69 mg/dL (ref 0.44–1.00)
GFR, Estimated: >60 mL/min (ref >60)
Glucose, Bld: 211 mg/dL — ABNORMAL HIGH (ref 70–99)
Potassium: 3.6 mmol/L (ref 3.5–5.1)
Sodium: 143 mmol/L (ref 135–145)

## 2024-05-30 LAB — PHOSPHORUS: Phosphorus: 1.7 mg/dL — ABNORMAL LOW (ref 2.5–4.6)

## 2024-05-30 LAB — MAGNESIUM: Magnesium: 1.9 mg/dL (ref 1.7–2.4)

## 2024-05-30 MED ORDER — POLYETHYLENE GLYCOL 3350 17 G PO PACK
17.0000 g | PACK | Freq: Every day | ORAL | Status: DC | PRN
Start: 2024-05-30 — End: 2024-06-04

## 2024-05-30 MED ORDER — BETHANECHOL CHLORIDE 10 MG PO TABS: 10.0000 mg | ORAL_TABLET | Freq: Three times a day (TID) | ORAL | Status: DC

## 2024-05-30 MED ORDER — ORAL CARE MOUTH RINSE
15.0000 mL | OROMUCOSAL | Status: DC | PRN
Start: 2024-05-30 — End: 2024-06-06

## 2024-05-30 MED ORDER — POTASSIUM PHOSPHATES 15 MMOLE/5ML IV SOLN
30.0000 mmol | Freq: Once | INTRAVENOUS | Status: AC
  Administered 2024-05-30: 30 mmol via INTRAVENOUS
  Filled 2024-05-30: qty 10

## 2024-05-30 MED ORDER — DOCUSATE SODIUM 50 MG/5ML PO LIQD
100.0000 mg | Freq: Two times a day (BID) | ORAL | Status: DC | PRN
Start: 2024-05-30 — End: 2024-06-04

## 2024-05-30 NOTE — Progress Notes (Signed)
 NAME:  Rita [name], MRN:  [id number], DOB:  [birth year], LOS: 3 ADMISSION DATE:  05/27/2024 CONSULTATION DATE:  Code Stroke, post-procedure vent management   History of Present Illness:  58 year old woman who presented to [facility] as a transfer from [facility] 6/2 with R-sided deficits. LKW 1415. PMHx significant for HTN, HLD, CVA, s/p L carotid subclavian bypass graft (on DAPT with Brilinta /ASA), COPD, tobacco use, GERD with Barrett's esophagus, chronic pancreatitis, chronic opioid use (on buprenorphine ), depression.  Patient presented to [facility] ED as a Code Stroke with R-sided deficits. LKW 1415. CT Head NAICA with remote infarcts in L cerebral hemisphere/bilateral cerebellum, chronic microvascular changes. CTA Head/Neck demonstrated occlusion of the L ICA from the proximal cervical segment to the ICA terminus and multifocal atherosclerosis within the neck as well as occlusion of L vertebral artery in the neck/similar occlusion of proximal L subclavian artery. TNK given 1544. NIR activated.  Patient was taken to [facility] where she had L common carotid arteriogram with complete revascularization of occluded distal L ICA supraclinoid segment and L M1 segment, with 3 passes achieving TICI 3 revascularization.  Pertinent Medical History:   Past Medical History:  Diagnosis Date   Arthritis    "qwhere" (02/22/2018)   Barrett's esophagus with esophagitis 03/26/2013   Chronic abdominal pain    Chronic lower back pain    Chronic pancreatitis (HCC)    COPD (chronic obstructive pulmonary disease) (HCC)    Depression    Dyspnea    GERD (gastroesophageal reflux disease)    Heart murmur    Hiatal hernia    High cholesterol    Hypertension    Peptic ulcer    Pneumonia 2000s X 1   Stroke (HCC)    Tobacco use 03/26/2013   Significant Hospital Events: Including procedures, antibiotic start and stop dates in addition to other pertinent events   6/2 - Presented as Code Stroke. LKW 1415. CT Head NAICA. CTA Head/Neck  demonstrated occlusion of the L ICA from the proximal cervical segment to the ICA terminus and multifocal atherosclerosis within the neck as well as occlusion of L vertebral artery in the neck/similar occlusion of proximal L subclavian artery. Taken to [facility]. PCCM consulted for post-procedure vent management. 6/3 Remained intubated post-NIR revascularization of occluded flow-diverter stent (L ICA) and L MCA M1.  Echo 6/3 with EF 65-70%, +hyperdynamic LV, G1DD, normal RV function, mildly elevated PASP (RVSP 37.1), no MR, +increased aortic velocities; negative bubble study. Repeat CT Head demonstrated stable remote L occipital/posterior watershed infarcts and stable remote cerebellar infarcts. Cleviprex  transitioned to Cardene in the setting of hypertriglyceridemia. MRI brain 1. No acute hemorrhage.2. Large acute nonhemorrhagic left MCA territory ischemic infarct.3. Acute nonhemorrhagic infarct in the posterior left lentiform nucleus.4. Acute nonhemorrhagic left periventricular white matter infarcts.5. Remote cortical infarcts in the left parietal lobe and along the watershed distribution over the left hemisphere. Remote inferior cerebellar infarcts, right greater than left. 6. Moderate mucosal disease in the sphenoid sinus bilaterally and posterior maxillary sinuses. Mild mucosal thickening in the anterior ethmoid air cells and inferior frontal sinuses. 6/4 extubated. GOC confirmed w/ daughter before hand. Would re-intubate and consider trach if needed. Failed swallow eval., marked expressive aphasia w/ emotional lability  6/5 urinary retention requiring I&O cath x2. Started urecholine Overall though looking better. Still having both expressive and receptive aphasia   Interim History / Subjective:  No distress  Ambulated w/ rehab svc   Objective:   Blood pressure 134/88, pulse 78, temperature 98.2 F (36.8 C), temperature  source Axillary, resp. rate 18, weight 102 kg, last menstrual period 02/09/2012,  SpO2 95%.    FiO2 (%):  [36 %] 36 %   Intake/Output Summary (Last 24 hours) at 05/30/2024 0802 Last data filed at 05/30/2024 0648 Gross per 24 hour  Intake 664.95 ml  Output 2988 ml  Net -2323.05 ml   Filed Weights   05/27/24 1534 05/27/24 1955  Weight: 99.7 kg 102 kg     Physical Examination: General no distress.  HENT [place] no JVD. Still has right sided facial droop Pulm some scattered wheezing no accessory use on room air. Cough seems improved Card rrr Abd soft  Ext warm dry no edema Neuro awake. Has both expressive and receptive aphasia. Follows some commands. Ambulated. Still w some right sided weakness Resolved prob list    Post operative respiratory failure (extubated 6/4)  Assessment & Plan:  Large acute left MCA territory stroke w/ L carotid occlusion c/b right sided weakness, expressive aphasia & dysphagia    - s/p NIR for L common carotid arteriogram with complete revascularization of occluded distal L ICA supraclinoid segment, and L M1 segment, with 3 passes achieving TICI 3 revascularization.  Hx of CVA and L carotid subclavian bypass graft (on DAPT) plan Serial neuro checks Normotension goal Antiplatelet therapy with asa and brilinta   SLP following has Cortrak  Neuroprotective measures: HOB > 30 degrees, normoglycemia, normothermia, electrolytes WNL PT/OT/SLP when able to participate in care  COPD w/ post CVA aspiration risk  Plan NPO meds via cortrak HOB elevated On-going work w/ SLP Scheduled nebs Mobilize Pulse ox goal sats > 92% PRN CXR  Urinary retention 6/5 Plan Start urecholine   Hyperglycemia  Plan Ssi  Goal 140-180  Hx HTN Hyperlipidemia/hypercholesterolemia Hypertriglyceridemia Plan Resumed home statin/Zetia  Norvasc  via tube  Hx GERD with Barrett's esophagus (on home Dexilant ). Plan H2B for now until can take POs  Hx Chronic opioid use Cocaine, THC+ on UDS Depression Tobacco abuse Plan Nicotine  patch Takes SL buprenorphine  at  home. May need to re-assess this w/ dysphagia TOC consult for cessation resources   Best Practice: (right click and "Reselect all SmartList Selections" daily)   Diet/type: tubefeeds DVT prophylaxis: LMWHs GI prophylaxis: PPI Lines: N/A Foley:  N/A Code Status:  full code Last date of multidisciplinary goals of care discussion [Per Primary Team]  Critical care time: NA  My time 38 min We will so pt awaiting transfer out

## 2024-05-30 NOTE — Progress Notes (Addendum)
  Inpatient Rehabilitation Admissions Coordinator   Please refer to Dr Roxan Copes consult. I contacted patient's daughter, Landa Pine, by phone. Daughter has not seen her Mom yet since hospitalized. She is local as well as a local sister. Patient's boyfriend, Cherise Cornelia, works night. Two sons in [place] . Nikki to contact family and Cherise Cornelia to clarify what caregiver supports they can provide for support of a Cir admit vs SNF if caregivers are not available. She will follow up with me tomorrow. Please call me with any questions.   Jeannetta Millman, RN, MSN Rehab Admissions Coordinator [phone]

## 2024-05-30 NOTE — Consult Note (Signed)
 Physical Medicine and Rehabilitation Consult Reason for Consult:CIR/L MCA stroke Referring Physician: Stroke team   HPI: Rita [name] is a 58 y.o. R handed female with hx of HTN, HLD, prior stroke with L carotid/subclavian occlusion; COPD, smoker, GERD, Barrett's esophagus, chronic pancreatitis, chronic depression and chronic suboxone  use- also (+) for Cocaine and THC at admission- patient was admitted 05/27/24 due to R hemiparesis and aphasia- she is s/p TNK and thrombectomy  She was found to have a L MCA large infarct as well as L ICA/L vertebral artery and L subclavian occlusions.  She was per chart, on Brillenta and ASA She has not been able to verbalize.  Although she walked 150 ft min A, she has very poor balance and poor safety awareness- which I agree with based on exam.  She has Cortrak due to NPO status- she has not been able to try water /ice trials.  6/5- required cathing, and Urecholine was started, however per nurse, pt couldn't pee in the bed and is voiding fine now up to toilet/BSC.  Pt reports she is "hurting all over"- but wasn't able to explain exactly why/what's going on.   LBM- pt said wasn't today- was per nursing x2.  Pt didn't understand/knew where peeing- says using purewick- but actually voiding in bathroom/BSC.  Denies visual changes, dizziness or nausea.      Review of Systems  Unable to perform ROS: Patient nonverbal  All other systems reviewed and are negative.  Past Medical History:  Diagnosis Date   Arthritis    "qwhere" (02/22/2018)   Barrett's esophagus with esophagitis 03/26/2013   Chronic abdominal pain    Chronic lower back pain    Chronic pancreatitis (HCC)    COPD (chronic obstructive pulmonary disease) (HCC)    Depression    Dyspnea    GERD (gastroesophageal reflux disease)    Heart murmur    Hiatal hernia    High cholesterol    Hypertension    Peptic ulcer    Pneumonia 2000s X 1   Stroke (HCC)    Tobacco use 03/26/2013    Past Surgical History:  Procedure Laterality Date   AORTIC ARCH ANGIOGRAPHY N/A 01/19/2018   Procedure: AORTIC ARCH ANGIOGRAPHY;  Surgeon: Richrd Char, MD;  [facility];  Service: Cardiovascular;  Laterality: N/A;   BIOPSY  04/17/2017   Procedure: BIOPSY;  Surgeon: Suzette Espy, MD;  [facility];  Service: Endoscopy;;  esophageal   CARDIAC CATHETERIZATION     CAROTID-SUBCLAVIAN BYPASS GRAFT Left 03/19/2018   Procedure: BYPASS GRAFT CAROTID-SUBCLAVIAN;  Surgeon: Richrd Char, MD;  Location: [facility] OR;  Service: Vascular;  Laterality: Left;   CESAREAN SECTION  1985; 1988   COLONOSCOPY WITH PROPOFOL  N/A 04/17/2017   Procedure: COLONOSCOPY WITH PROPOFOL ;  Surgeon: Suzette Espy, MD;  [facility];  Service: Endoscopy;  Laterality: N/A;  100   ESOPHAGOGASTRODUODENOSCOPY  Oct 2011   Dr. Homero Luster: ulcer in distal esophagus, soft stricture at GE junction s/p balloon dilation PATH: BARRETT'S   ESOPHAGOGASTRODUODENOSCOPY (EGD) WITH ESOPHAGEAL DILATION N/A 03/27/2013   Procedure: ESOPHAGOGASTRODUODENOSCOPY (EGD) WITH ESOPHAGEAL DILATION;  Surgeon: Suzette Espy, MD;  [facility];  Service: Endoscopy;  Laterality: N/A;  possible dilation   ESOPHAGOGASTRODUODENOSCOPY (EGD) WITH PROPOFOL  N/A 01/02/2017   Procedure: ESOPHAGOGASTRODUODENOSCOPY (EGD) WITH PROPOFOL ;  Surgeon: Suzette Espy, MD;  [facility];  Service: Endoscopy;  Laterality: N/A;  with possible esophageal dilation  ESOPHAGOGASTRODUODENOSCOPY (EGD) WITH PROPOFOL  N/A 04/17/2017   Procedure: ESOPHAGOGASTRODUODENOSCOPY (EGD) WITH PROPOFOL ;  Surgeon: Suzette Espy, MD;  [facility];  Service: Endoscopy;  Laterality: N/A;   EUS N/A 05/09/2013   Procedure: UPPER ENDOSCOPIC ULTRASOUND (EUS) LINEAR;  Surgeon: Janel Medford, MD;  Location: [facility];  Service: Endoscopy;  Laterality: N/A;   FRACTURE SURGERY     pt. denies   IR ANGIO INTRA EXTRACRAN SEL COM CAROTID  INNOMINATE BILAT MOD SED  09/06/2022   IR ANGIO INTRA EXTRACRAN SEL COM CAROTID INNOMINATE BILAT MOD SED  09/13/2022   IR ANGIO VERTEBRAL SEL SUBCLAVIAN INNOMINATE BILAT MOD SED  09/13/2022   IR ANGIO VERTEBRAL SEL SUBCLAVIAN INNOMINATE UNI R MOD SED  09/06/2022   IR ANGIOGRAM FOLLOW UP STUDY  09/13/2022   IR CT HEAD LTD  09/08/2022   IR CT HEAD LTD  05/27/2024   IR CT HEAD LTD  05/27/2024   IR NEURO EACH ADD'L AFTER BASIC UNI LEFT (MS)  09/13/2022   IR PATIENT EVAL TECH 0-60 MINS  05/28/2024   IR PERCUTANEOUS ART THROMBECTOMY/INFUSION INTRACRANIAL INC DIAG ANGIO  05/27/2024   IR TRANSCATH/EMBOLIZ  09/08/2022   IR US  GUIDE VASC ACCESS RIGHT  09/08/2022   IR US  GUIDE VASC ACCESS RIGHT  09/06/2022   KNEE ARTHROSCOPY Right    LAPAROSCOPIC CHOLECYSTECTOMY     LEFT HEART CATH AND CORONARY ANGIOGRAPHY N/A 02/26/2018   Procedure: LEFT HEART CATH AND CORONARY ANGIOGRAPHY;  Surgeon: Odie Benne, MD;  [facility];  Service: Cardiovascular;  Laterality: N/A;   MALONEY DILATION N/A 04/17/2017   Procedure: Londa Rival DILATION;  Surgeon: Suzette Espy, MD;  [facility];  Service: Endoscopy;  Laterality: N/A;   PATELLA FRACTURE SURGERY Left    POLYPECTOMY  04/17/2017   Procedure: POLYPECTOMY;  Surgeon: Suzette Espy, MD;  [facility];  Service: Endoscopy;;   RADIOLOGY WITH ANESTHESIA N/A 09/08/2022   Procedure: L ICA Aneurysm Embolazation;  Surgeon: Luellen Sages, MD;  Location[facility];  Service: Radiology;  Laterality: N/A;   RADIOLOGY WITH ANESTHESIA N/A 05/27/2024   Procedure: RADIOLOGY WITH ANESTHESIA;  Surgeon: Radiologist, Medication, MD;  Location: [facility];  Service: Radiology;  Laterality: N/A;   TEE WITHOUT CARDIOVERSION N/A 01/23/2019   Procedure: TRANSESOPHAGEAL ECHOCARDIOGRAM (TEE);  Surgeon: Hugh Madura, MD;  Location[facility] ENDOS[facility];  Service: Cardiovascular;  Laterality: N/A;   TUBAL LIGATION  1992   UPPER EXTREMITY ANGIOGRAPHY N/A 01/19/2018   Procedure:  UPPER EXTREMITY ANGIOGRAPHY;  Surgeon: Richrd Char, MD;  [facility];  Service: Cardiovascular;  Laterality: N/A;   Family History  Problem Relation Age of Onset   Asthma Mother    Heart failure Mother    Cancer Mother        pancreatic   Diabetes Mother    Hypertension Mother    Stroke Mother    Pancreatic cancer Mother        deceased   Heart failure Father    Diabetes Father    Colon cancer Neg Hx    Social History:  reports that she has been smoking cigarettes. She started smoking about 41 years ago. She has a 20.8 pack-year smoking history. She has never used smokeless tobacco. She reports that she does not drink alcohol  and does not use drugs. Allergies: No Known Allergies Medications Prior to Admission  Medication Sig Dispense Refill   albuterol  (VENTOLIN  HFA) 108 (90 Base) MCG/ACT inhaler Inhale  2 puffs into the lungs every 4 (four) hours as needed for wheezing or shortness of breath.     ASPIRIN  LOW DOSE 81 MG tablet Take 81 mg by mouth daily.     atorvastatin  (LIPITOR ) 80 MG tablet Take 1 tablet (80 mg total) by mouth daily at 6 PM. (Patient taking differently: Take 80 mg by mouth at bedtime.) 60 tablet 0   BRILINTA  90 MG TABS tablet Take 90 mg by mouth daily.     Buprenorphine  HCl-Naloxone  HCl 8-2 MG FILM Take 1 Film by mouth 3 (three) times daily. 15 each 0   ezetimibe  (ZETIA ) 10 MG tablet Take 1 tablet (10 mg total) by mouth daily. 30 tablet 0   potassium chloride  (MICRO-K ) 10 MEQ CR capsule Take 1 capsule (10 mEq total) by mouth 2 (two) times daily. 120 capsule 0   varenicline (CHANTIX) 0.5 MG tablet Take 0.5 mg by mouth 3 (three) times daily.     dexlansoprazole  (DEXILANT ) 60 MG capsule Take 1 capsule (60 mg total) by mouth daily. (Patient not taking: Reported on 05/28/2024) 30 capsule 0   ondansetron  (ZOFRAN ) 4 MG tablet Take by mouth.     promethazine  (PHENERGAN ) 12.5 MG tablet Take 12.5 mg by mouth 3 (three) times daily as needed.       Home: Home Living Family/patient expects to be discharged to:: Private residence Living Arrangements: Spouse/significant other (fiance) Available Help at Discharge: Family, Available 24 hours/day Type of Home: House Home Access: Stairs to enter Secretary/administrator of Steps: 1 Entrance Stairs-Rails: Right Home Layout: One level Bathroom Shower/Tub: Engineer, manufacturing systems: Standard Bathroom Accessibility: Yes Home Equipment: Cane - single point Additional Comments: Pt unable to provide  Functional History: Prior Function Prior Level of Function : Independent/Modified Independent Functional Status:  Mobility: Bed Mobility Overal bed mobility: Needs Assistance Bed Mobility: Supine to Sit Supine to sit: Supervision Sit to supine: Min assist General bed mobility comments: Pt with difficulty following commands for reciprocal scooting Transfers Overall transfer level: Needs assistance Equipment used: None Transfers: Sit to/from Stand Sit to Stand: Contact guard assist, +2 safety/equipment General transfer comment: Good power up to standing from edge of bed and toilet, CGA for safety Ambulation/Gait Ambulation/Gait assistance: Min assist Gait Distance (Feet): 150 Feet Assistive device: None Gait Pattern/deviations: Step-through pattern, Decreased stride length General Gait Details: Pt bumping into 2 obstacles on R without awareness Gait velocity: 1.62 ft/s Gait velocity interpretation: <1.8 ft/sec, indicate of risk for recurrent falls    ADL: ADL Overall ADL's : Needs assistance/impaired Grooming: Maximal assistance Grooming Details (indicate cue type and reason): needed hand over hand Lower Body Dressing: Total assistance  Cognition: Cognition Overall Cognitive Status: Difficult to assess Arousal/Alertness: Awake/alert Orientation Level: Disoriented X4 Attention: Sustained Sustained Attention: Appears intact Problem Solving: Impaired Problem Solving  Impairment: Functional basic Cognition Arousal: Alert Behavior During Therapy: Flat affect Overall Cognitive Status: Difficult to assess  Blood pressure 135/73, pulse 77, temperature 98 F (36.7 C), temperature source Oral, resp. rate 18, weight 102 kg, last menstrual period 02/09/2012, SpO2 97%. Physical Exam Vitals and nursing note reviewed. Exam conducted with a chaperone present.  Constitutional:      General: She is not in acute distress.    Appearance: Normal appearance. She is obese.     Comments: Pt sitting up in bed; nonverbal- attached to tele- awake, somewhat alert, NAD  HENT:     Head: Normocephalic and atraumatic.     Comments: R sided facial droop Couldn't stick out  tongue for me- apraxia vs aphasia     Right Ear: External ear normal.     Left Ear: External ear normal.     Nose: Nose normal.     Mouth/Throat:     Mouth: Mucous membranes are dry.     Pharynx: Oropharynx is clear.     Comments: Cortrak in place Eyes:     General:        Right eye: No discharge.        Left eye: No discharge.     Comments: EOM grossly intact  Cardiovascular:     Rate and Rhythm: Normal rate and regular rhythm.     Heart sounds: Normal heart sounds. No murmur heard.    No gallop.  Pulmonary:     Comments: Decreased at bases B/L L>R Coarse breath sounds with some crackling- no W/R Abdominal:     General: There is no distension.     Palpations: Abdomen is soft.     Tenderness: There is no abdominal tenderness.     Comments: Protuberant Normoactive BS  Musculoskeletal:        General: Normal range of motion.     Cervical back: Neck supple. No tenderness.     Comments:  LUE 5/5 in Biceps/Grip- couldn't do other testing RUE- 5-/5 in biceps/grip- couldn't test others due to aphasia/apraxia LLE- 5/5- only tested HF/KE/DF due to understanding/apraxia RLE_ 5-/5 in HF and DF  Skin:    General: Skin is warm and dry.     Comments: IV in L forearm- wrapped up to cover it- looks OK   Neurological:     Mental Status: She is alert.     Comments: Completely nonverbal due to aphasia- also has receptive aphasia vs severe apraxia Also noted mild R inattention And perseveration- couldn't move on to next task Nods head yes that sensation is intact in all 4 extremities- not sure?  Psychiatric:     Comments: Tearful as soon as asked her to say yes/no/her name     Results for orders placed or performed during the hospital encounter of 05/27/24 (from the past 24 hours)  Magnesium      Status: None   Collection Time: 05/29/24  5:04 PM  Result Value Ref Range   Magnesium  2.0 1.7 - 2.4 mg/dL  Phosphorus     Status: Abnormal   Collection Time: 05/29/24  5:04 PM  Result Value Ref Range   Phosphorus 1.5 (L) 2.5 - 4.6 mg/dL  Glucose, capillary     Status: Abnormal   Collection Time: 05/29/24  7:45 PM  Result Value Ref Range   Glucose-Capillary 146 (H) 70 - 99 mg/dL  Glucose, capillary     Status: Abnormal   Collection Time: 05/29/24 11:52 PM  Result Value Ref Range   Glucose-Capillary 159 (H) 70 - 99 mg/dL  Glucose, capillary     Status: Abnormal   Collection Time: 05/30/24  3:51 AM  Result Value Ref Range   Glucose-Capillary 124 (H) 70 - 99 mg/dL  Glucose, capillary     Status: Abnormal   Collection Time: 05/30/24  7:27 AM  Result Value Ref Range   Glucose-Capillary 151 (H) 70 - 99 mg/dL  Glucose, capillary     Status: Abnormal   Collection Time: 05/30/24 11:12 AM  Result Value Ref Range   Glucose-Capillary 144 (H) 70 - 99 mg/dL  Magnesium      Status: None   Collection Time: 05/30/24 12:25 PM  Result Value Ref Range  Magnesium  1.9 1.7 - 2.4 mg/dL  Phosphorus     Status: Abnormal   Collection Time: 05/30/24 12:25 PM  Result Value Ref Range   Phosphorus 1.7 (L) 2.5 - 4.6 mg/dL  Basic metabolic panel     Status: Abnormal   Collection Time: 05/30/24 12:25 PM  Result Value Ref Range   Sodium 143 135 - 145 mmol/L   Potassium 3.6 3.5 - 5.1 mmol/L   Chloride 107 98 -  111 mmol/L   CO2 23 22 - 32 mmol/L   Glucose, Bld 211 (H) 70 - 99 mg/dL   BUN 17 6 - 20 mg/dL   Creatinine, Ser 1.61 0.44 - 1.00 mg/dL   Calcium  8.9 8.9 - 10.3 mg/dL   GFR, Estimated >09 >60 mL/min   Anion gap 13 5 - 15  CBC     Status: Abnormal   Collection Time: 05/30/24 12:25 PM  Result Value Ref Range   WBC 11.9 (H) 4.0 - 10.5 K/uL   RBC 4.08 3.87 - 5.11 MIL/uL   Hemoglobin 12.4 12.0 - 15.0 g/dL   HCT 45.4 09.8 - 11.9 %   MCV 92.6 80.0 - 100.0 fL   MCH 30.4 26.0 - 34.0 pg   MCHC 32.8 30.0 - 36.0 g/dL   RDW 14.7 82.9 - 56.2 %   Platelets 386 150 - 400 K/uL   nRBC 0.0 0.0 - 0.2 %  Glucose, capillary     Status: Abnormal   Collection Time: 05/30/24  3:23 PM  Result Value Ref Range   Glucose-Capillary 164 (H) 70 - 99 mg/dL   DG Abd Portable 1V Result Date: 05/29/2024 CLINICAL DATA:  Feeding tube placement. EXAM: PORTABLE ABDOMEN - 1 VIEW COMPARISON:  May 27, 2024. FINDINGS: Distal tip of feeding tube is seen in expected position of distal stomach or proximal duodenum. IMPRESSION: Feeding tube tip seen in expected position of distal stomach or proximal duodenum. Electronically Signed   By: Rosalene Colon M.D.   On: 05/29/2024 13:53   MR BRAIN WO CONTRAST Result Date: 05/29/2024 EXAM: MRI BRAIN WITHOUT CONTRAST 05/28/2024 04:35:37 PM TECHNIQUE: Multiplanar multisequence MRI of the head/brain was performed without the administration of intravenous contrast. COMPARISON: CT head without contrast 05/28/2024. MR head without contrast 05/19/2023. CLINICAL HISTORY: Stroke, follow up; 24 hrs post-TNK r/o hemorrhagic conversion. Revascularization of occluded left internal carotid artery 05/27/2024. FINDINGS: BRAIN AND VENTRICLES: A large acute nonhemorrhagic left MCA territory ischemic infarct is present. Acute nonhemorrhagic infarct is present in the posterior left lentiform nucleus. Acute nonhemorrhagic left periventricular acute white matter infarcts are present. No acute hemorrhage is present.  Remote cortical infarcts are present in the left parietal lobe and along the watershed distribution over the left hemisphere. Remote inferior cerebellar infarcts are present, right greater than left. No mass. No midline shift. No hydrocephalus. The sella is unremarkable. Normal flow voids. ORBITS: No acute abnormality. SINUSES AND MASTOIDS: Fluid in the nasopharynx is likely secondary to intubation. Moderate mucosal disease is present in the sphenoid sinus bilaterally and posterior maxillary sinuses. Mild mucosal thickening is present in the anterior ethmoid air cells and inferior frontal sinuses. BONES AND SOFT TISSUES: Normal marrow signal. No acute soft tissue abnormality. IMPRESSION: 1. No acute hemorrhage. 2. Large acute nonhemorrhagic left MCA territory ischemic infarct. 3. Acute nonhemorrhagic infarct in the posterior left lentiform nucleus. 4. Acute nonhemorrhagic left periventricular white matter infarcts. 5. Remote cortical infarcts in the left parietal lobe and along the watershed distribution over the left hemisphere. Remote inferior  cerebellar infarcts, right greater than left. 6. Moderate mucosal disease in the sphenoid sinus bilaterally and posterior maxillary sinuses. Mild mucosal thickening in the anterior ethmoid air cells and inferior frontal sinuses. The pertinent results were texted to Dr. Murvin Arthurs via the [facility] system at 5:22am. Electronically signed by: Audree Leas MD 05/29/2024 05:23 AM EDT RP Workstation: [id number]      Celia Coles, MD 05/30/2024  Assessment/Plan: Diagnosis: large L MCA stroke with receptive and expressive aphasia and dysphagia Does the need for close, 24 hr/day medical supervision in concert with the patient's rehab needs make it unreasonable for this patient to be served in a less intensive setting? Yes Co-Morbidities requiring supervision/potential complications: NPO- Cortrak; receptive and expressive aphasia- nonverbal, Polysubstance abuse; chronic  opioid use; HTN, HLD, prior CVA; COPD, smoker; GERD, Barrett's esophagus, depression.  Due to bladder management, bowel management, safety, skin/wound care, disease management, medication administration, pain management, and patient education, does the patient require 24 hr/day rehab nursing? Yes Does the patient require coordinated care of a physician, rehab nurse, therapy disciplines of PT, OT and SLP to address physical and functional deficits in the context of the above medical diagnosis(es)? Yes Addressing deficits in the following areas: balance, endurance, locomotion, strength, transferring, bowel/bladder control, bathing, dressing, feeding, grooming, toileting, cognition, speech, language, and swallowing Can the patient actively participate in an intensive therapy program of at least 3 hrs of therapy per day at least 5 days per week? Yes The potential for patient to make measurable gains while on inpatient rehab is good Anticipated functional outcomes upon discharge from inpatient rehab are modified independent and supervision  with PT, modified independent and supervision with OT, mod assist with SLP. Estimated rehab length of stay to reach the above functional goals is: 10-14 days Anticipated discharge destination: Home Overall Rehab/Functional Prognosis: good  RECOMMENDATIONS: This patient's condition is appropriate for continued rehabilitative care in the following setting: CIR Patient has agreed to participate in recommended program. Yes Note that insurance prior authorization may be required for reimbursement for recommended care.  Comment:  Cannot assess social situation, because pt is nonverbal-  will have admissions coordinator help with this.   Spoke with nurse about getting a communication board for pt- since she's very tearful about inability to communicate.  Needs something for mood/depression- ASAP - burst into tears when asked if she could say her name.  Pt has RUE  swelling- mainly hand- needs to be elevated.  Doesn't need PRAFO or WHO based on exam Will submit for insurance approval for CIR as long as pt has dispo.  Thank you for this consult.      I spent a total of 64   minutes on total care today- >50% coordination of care- due to  reviewed chart, including therapy notes, vitals, labs; and examined pt- as well as spoke with pt's nurse x2, admission coordinator- and documented note-    LOS: 3 days A FACE TO FACE EVALUATION WAS PERFORMED  Wofford Stratton 05/30/2024, 4:14 PM

## 2024-05-30 NOTE — Progress Notes (Signed)
 Speech Language Pathology Treatment: Dysphagia  Patient Details Name: Rita [name] MRN: [id number] DOB: February 14, 1966 Today's Date: 05/30/2024 Time: 1410-1436 SLP Time Calculation (min) (ACUTE ONLY): 26 min  Assessment / Plan / Recommendation Clinical Impression  Patient seen by SLP for skilled treatment focused on dysphagia goals/PO trials. Patient was awake, sitting in bed and RN had just helped her ambulate to and use the bathroom. She continues to be emotionally labile but also appears to be frustrated and somewhat anxious. After oral care via Yankauer suction, SLP assessed her toleration with PO's of thin liquids (water ) and puree solids (applesauce). She was able to hold cup and take cup sips of water  without needing assistance. She held cup of applesauce in left hand but as her hand appears significantly apraxic, she was not able to functionally use it to grasp spoon, despite multiple attempts on her part. Aside from swallow initiation delays suspected to be occurring during initial few sips, patient tolerated thin liquids and puree solids without overt s/s aspiration. No change in vitals observed. SLP is recommending to initiate PO diet of full liquids/thin consistency and will follow for toleration.    HPI HPI: Rita [name] is a 58 yo female presenting to [facility] with R sided weakness and aphasia. CTA showed occlusion of the L ICA, L vertebral artery and L subclavian artery occlusions. TNK administered and transferred to [facility] for thrombectomy. Remained intubated after the procedure, 6/2-6/4. MRI shows large L MCA ischemic infarct and nonhemorrhagic L lentiform nucleus and periventricular white matter infarcts. PMH includes HTN, HLD, prior CVA s/p L carotid subclavian bypass graft, COPD, tobacco use, GERD with Barrett's esophagus, chronic pancreatitis, chronic opioid use, depression      SLP Plan  Continue with current plan of care          Recommendations  Diet recommendations: Thin  liquid;Other(comment) (full liquids) Liquids provided via: Cup;Straw Medication Administration: Crushed with puree Supervision: Patient able to self feed;Full supervision/cueing for compensatory strategies Compensations: Slow rate;Small sips/bites Postural Changes and/or Swallow Maneuvers: Seated upright 90 degrees;Upright 30-60 min after meal                Rehab consult Oral care QID;Oral care prior to ice chip/H20;Staff/trained caregiver to provide oral care   Frequent or constant Supervision/Assistance Dysphagia, unspecified (R13.10)     Continue with current plan of care     Jacqualine Mater, MA, CCC-SLP Speech Therapy

## 2024-05-30 NOTE — TOC Progression Note (Signed)
 Transition of Care [facility]) - Progression Note    Patient Details  Name: Rita [name] MRN: [id number] Date of Birth: May 14, 1966  Transition of Care [facility]) CM/SW Contact  Jannice Mends, LCSW Phone Number: 05/30/2024, 8:51 AM  Clinical Narrative:    CSW continuing to follow.      Barriers to Discharge: Continued Medical Work up  Expected Discharge Plan and Services   Discharge Planning Services: CM Consult   Living arrangements for the past 2 months: Single Family Home                                       Social Determinants of Health (SDOH) Interventions SDOH Screenings   Food Insecurity: Patient Unable To Answer (05/28/2024)  Housing: Patient Unable To Answer (05/28/2024)  Transportation Needs: Patient Unable To Answer (05/28/2024)  Utilities: Patient Unable To Answer (05/28/2024)  Tobacco Use: High Risk (05/27/2024)    Readmission Risk Interventions     No data to display

## 2024-05-30 NOTE — Progress Notes (Signed)
 Physical Therapy Treatment Patient Details Name: Rita [name] MRN: [id number] DOB: 04-25-1966 Today's Date: 05/30/2024   History of Present Illness Rita [name] is a 57 yo female presenting to [facility] with R sided weakness and aphasia. CTA showed occlusion of the L ICA, L vertebral artery and L subclavian artery occlusions. TNK administered and transferred to [facility] for thrombectomy. Remained intubated after the procedure, 6/2-6/4. MRI shows large L MCA ischemic infarct and nonhemorrhagic L lentiform nucleus and periventricular white matter infarcts. PMH includes HTN, HLD, prior CVA s/p L carotid subclavian bypass graft, COPD, tobacco use, GERD with Barrett's esophagus, chronic pancreatitis, chronic opioid use, depression.    PT Comments  Pt making steady progress towards her physical therapy goals. Pt continues with no verbalizations; demonstrates accuracy with yes/no responses ~75% of the time. Able to follow gestural cueing fairly consistently with mild impulsivity. Pt ambulating limited hallway distances with min assist; demonstrates some right sided inattention, bumping into obstacles. Gait speed of 1.62 ft/s indicative of high fall risk. Patient will benefit from intensive inpatient follow-up therapy, >3 hours/day in order to address deficits, maximize functional mobility and decrease caregiver burden.   Recommend toileting schedule due to patient communication deficits; RN/NT notified.     If plan is discharge home, recommend the following: A lot of help with bathing/dressing/bathroom;Assistance with cooking/housework;Assist for transportation;Help with stairs or ramp for entrance;Supervision due to cognitive status   Can travel by private vehicle        Equipment Recommendations  None recommended by PT    Recommendations for Other Services Rehab consult     Precautions / Restrictions Precautions Precautions: Fall;Other (comment) Recall of Precautions/Restrictions:  Impaired Restrictions Weight Bearing Restrictions Per Provider Order: No     Mobility  Bed Mobility Overal bed mobility: Needs Assistance Bed Mobility: Supine to Sit     Supine to sit: Supervision          Transfers Overall transfer level: Needs assistance Equipment used: None Transfers: Sit to/from Stand Sit to Stand: Contact guard assist, +2 safety/equipment           General transfer comment: Good power up to standing from edge of bed and toilet, CGA for safety    Ambulation/Gait Ambulation/Gait assistance: Min assist Gait Distance (Feet): 150 Feet Assistive device: None Gait Pattern/deviations: Step-through pattern, Decreased stride length Gait velocity: 1.62 ft/s Gait velocity interpretation: <1.8 ft/sec, indicate of risk for recurrent falls   General Gait Details: Pt bumping into 2 obstacles on R without awareness   Stairs             Wheelchair Mobility     Tilt Bed    Modified Rankin (Stroke Patients Only) Modified Rankin (Stroke Patients Only) Pre-Morbid Rankin Score: No symptoms Modified Rankin: Moderately severe disability     Balance Overall balance assessment: Needs assistance Sitting-balance support: Feet supported Sitting balance-Leahy Scale: Good     Standing balance support: No upper extremity supported, During functional activity Standing balance-Leahy Scale: Good                              Communication Communication Communication: Impaired Factors Affecting Communication: Difficulty expressing self  Cognition Arousal: Alert Behavior During Therapy: Flat affect   PT - Cognitive impairments: Difficult to assess Difficult to assess due to: Impaired communication                     PT - Cognition Comments:  75% accurate with yes/no questions, no verbalizations, follows gestural commands well Following commands: Impaired Following commands impaired: Follows one step commands inconsistently     Cueing Cueing Techniques: Verbal cues, Gestural cues, Tactile cues, Visual cues  Exercises      General Comments        Pertinent Vitals/Pain Pain Assessment Pain Assessment: Faces Faces Pain Scale: No hurt    Home Living                          Prior Function            PT Goals (current goals can now be found in the care plan section) Acute Rehab PT Goals Patient Stated Goal: unable PT Goal Formulation: With patient Time For Goal Achievement: 06/12/24 Potential to Achieve Goals: Good Progress towards PT goals: Progressing toward goals    Frequency    Min 3X/week      PT Plan      Co-evaluation              AM-PAC PT "6 Clicks" Mobility   Outcome Measure  Help needed turning from your back to your side while in a flat bed without using bedrails?: A Little Help needed moving from lying on your back to sitting on the side of a flat bed without using bedrails?: A Little Help needed moving to and from a bed to a chair (including a wheelchair)?: A Little Help needed standing up from a chair using your arms (e.g., wheelchair or bedside chair)?: A Little Help needed to walk in hospital room?: A Little Help needed climbing 3-5 steps with a railing? : A Lot 6 Click Score: 17    End of Session   Activity Tolerance: Patient tolerated treatment well Patient left: with call bell/phone within reach;in chair;with chair alarm set Nurse Communication: Mobility status PT Visit Diagnosis: Unsteadiness on feet (R26.81);Hemiplegia and hemiparesis Hemiplegia - Right/Left: Right Hemiplegia - dominant/non-dominant: Dominant Hemiplegia - caused by: Cerebral infarction     Time: 1610-9604 PT Time Calculation (min) (ACUTE ONLY): 29 min  Charges:    $Therapeutic Activity: 23-37 mins PT General Charges $$ ACUTE PT VISIT: 1 Visit                     Verdia Glad, PT, DPT Acute Rehabilitation Services Office [phone]    Claria Crofts 05/30/2024, 10:50 AM

## 2024-05-30 NOTE — Progress Notes (Addendum)
 STROKE TEAM PROGRESS NOTE    SIGNIFICANT HOSPITAL EVENTS 6/2-patient presented to [facility] with right-sided weakness and aphasia, TNK was administered and patient was transferred here for thrombectomy. TICI 3 revascularization of distal left ICA stent occlusion and MCA achieved, remained intubated after procedure 6/4- Extubated   INTERIM HISTORY/SUBJECTIVE MRI done yesterday shows large left MCA cortical infarct without significant hemorrhagic transformation of cytotoxic edema. Able to walk today with PT/OT- plan for CIR. Transfer out of ICU today. Neuro exam improving but still with mixed aphasia   OBJECTIVE  CBC    Component Value Date/Time   WBC 11.9 (H) 05/30/2024 1225   RBC 4.08 05/30/2024 1225   HGB 12.4 05/30/2024 1225   HCT 37.8 05/30/2024 1225   PLT 386 05/30/2024 1225   MCV 92.6 05/30/2024 1225   MCH 30.4 05/30/2024 1225   MCHC 32.8 05/30/2024 1225   RDW 14.9 05/30/2024 1225   LYMPHSABS 0.9 05/28/2024 0548   MONOABS 0.6 05/28/2024 0548   EOSABS 0.0 05/28/2024 0548   BASOSABS 0.0 05/28/2024 0548    BMET    Component Value Date/Time   NA 143 05/30/2024 1225   K 3.6 05/30/2024 1225   CL 107 05/30/2024 1225   CO2 23 05/30/2024 1225   GLUCOSE 211 (H) 05/30/2024 1225   BUN 17 05/30/2024 1225   CREATININE 0.69 05/30/2024 1225   CREATININE 0.94 02/08/2018 1209   CALCIUM  8.9 05/30/2024 1225   GFRNONAA >60 05/30/2024 1225    IMAGING past 24 hours DG Abd Portable 1V Result Date: 05/29/2024 CLINICAL DATA:  Feeding tube placement. EXAM: PORTABLE ABDOMEN - 1 VIEW COMPARISON:  May 27, 2024. FINDINGS: Distal tip of feeding tube is seen in expected position of distal stomach or proximal duodenum. IMPRESSION: Feeding tube tip seen in expected position of distal stomach or proximal duodenum. Electronically Signed   By: Rosalene Colon M.D.   On: 05/29/2024 13:53    Vitals:   05/30/24 1100 05/30/24 1159 05/30/24 1200 05/30/24 1300  BP: 139/72  136/84 135/73   Pulse: 65  73 77  Resp:   18   Temp:  98.1 F (36.7 C)    TempSrc:  Axillary    SpO2: 97%  96% 97%  Weight:         PHYSICAL EXAM General:  Alert, well-nourished, well-developed patient in no acute distress Psych:  Mood and affect appropriate for situation CV: Regular rate and rhythm on monitor Respiratory: Respirations synchronous with ventilator  Neuro: Mental Status: Patient is awake, nods yes and no inconsistently. Has receptive and expressive aphasia. Able to mimic gestures.  Cranial Nerves: II: Right hemianopia  III,IV, VI: EOMI without ptosis or diploplia.  V: Facial sensation is symmetric to temperature VII: Facial movement is symmetric resting and smiling VIII: Hearing is intact to voice X: Palate elevates symmetrically XI: Shoulder shrug is symmetric. XII: Tongue protrudes midline without atrophy or fasciculations.  Motor: Tone is normal. Bulk is normal.  RUE and RLE weaker than left but antigravity movement Appears to have full strength on the left  Sensory: Localizes bilaterally  Cerebellar: Unable to follow commands to complete FNF  Gait:  assisted     .   ASSESSMENT/PLAN  [name] is a 58 y.o. female with history of smoking, left carotid subclavian bypass graft, right lower lung mass, COPD, chronic pancreatitis, depression, GERD, hyperlipidemia, hypertension and previous stroke admitted for acute onset right-sided weakness and aphasia.  Patient was given TNK at [facility]  and was transferred here for thrombectomy.  Thrombectomy was successful with revascularization of left ICA and left M1 segment achieved with 3 passes.  NIH on Admission 18  Acute Ischemic Infarct:  left MCA territory stroke due to left distal ICA stent and MCA occlusion s/p mechanical thrombectomy and TNK Etiology: Large vessel occlusion- left distal ICA stent occlusion Code Stroke CT head No acute abnormality. ASPECTS 10.    CTA head & neck occlusion of the left  ICA from proximal cervical segment to ICA terminus, left common carotid to left subclavian bypass noted, moderate stenosis of right supraclinoid ICA, focal moderate stenosis of proximal M2 branch of left MCA, moderate stenosis of A2 segment of left ACA Post IR CT no acute abnormality MRI - Large acute nonhemorrhagic left MCA territory ischemic infarct. 3. Acute nonhemorrhagic infarct in the posterior left lentiform nucleus. Acute nonhemorrhagic left periventricular white matter infarcts. Remote cortical infarcts in the left parietal lobe and along the watershed distribution over the left hemisphere. Remote inferior cerebellar infarcts, right greater than left. Moderate mucosal disease in the sphenoid sinus bilaterally and posterior maxillary sinuses. Mild mucosal thickening in the anterior ethmoid air cells and inferior frontal sinuses. 2D Echo EF 65 to 70%, grade 1 diastolic dysfunction, no interatrial shunt, normal left atrial size LDL 97 HgbA1c 6.3 VTE prophylaxis -SCDs aspirin  81 mg daily and Brilinta  (ticagrelor ) 90 mg bid prior to admission, now on back on aspirin  and Brilinta    administration Therapy recommendations:  Pending Disposition: Pending  Hx of Stroke/TIA Patient has a history of TIA as well as previous stroke in the left cerebral hemisphere and cerebellum  Hypertension Home meds: None Unstable, requiring nicardipine Blood Pressure Goal: SBP 120-160 for first 24 hours then less than 180   Hyperlipidemia Home meds: Atorvastatin  80 mg daily and ezetimibe  10 mg daily, resumed in hospital LDL- 97 Continue statin at discharge  Diabetes type II Controlled Home meds: None HgbA1c 6.3, goal < 7.0 CBGs SSI Recommend close follow-up with PCP for better DM control  Tobacco Abuse Patient smokes 0.5 packs per day for 41 years Will assess readiness to quit when patient extubated  Substance Abuse Patient uses cocaine and THC UDS positive for  THC  Cocaine Will assess readiness  to quit when patient extubated TOC consult for cessation placed  Dysphagia Patient has post-stroke dysphagia, SLP consulted    Diet   Diet NPO time specified  Cortrak in place Advance diet as tolerated  Respiratory failure Patient left intubated after procedure Extubated 6/4 CCM signed off 6/5- plan to transfer out of ICU  Other Stroke Risk Factors Obesity, Body mass index is 35.22 kg/m., BMI >/= 30 associated with increased stroke risk, recommend weight loss, diet and exercise as appropriate   Other Active Problems Hypokalemia 2.9 -> replaced BMP in the morning   Hospital day # 3  Patient seen and examined by NP/APP with MD. MD to update note as needed.   Imogene Mana, DNP, FNP-BC [facility] Pager: [phone]  I have personally obtained history,examined this patient, reviewed notes, independently viewed imaging studies, participated in medical decision making and plan of care.ROS completed by me personally and pertinent positives fully documented  I have made any additions or clarifications directly to the above note. Agree with note above.  Patient remains with significant mixed aphasia though right hemiparesis is improving.  He still has dysphagia and has a feeding tube.  Continue aspirin  and Brilinta  and mobilize out of bed.  Continue ongoing therapies.  Transfer out of ICU today to floor bed.  Likely need inpatient rehab.  No family available at the bedside today for discussion.  Discussed with critical care team.  Greater than 50% time during this 50-minute visit was spent in counseling and coordination of care about aphasia and stroke discussion with patient and care team questions.  Ardella Beaver, MD Medical Director [facility] Pager: [phone] 05/30/2024 2:50 PM   To contact Stroke Continuity provider, please refer to [email or web address]. After hours, contact General Neurology

## 2024-05-30 NOTE — Plan of Care (Signed)
 Patient was cleared to have a full liquid diet per SLP. Patient tolerated diet well and seems to be highly motivated to participate in ADLs. Patient up several times during the day to go to the bathroom and has both voided and produced BMs. Patient does seem to get out of breath when ambulating but oxygen saturations maintain normal limits.    Problem: Nutrition: Goal: Adequate nutrition will be maintained Outcome: Progressing   Problem: Elimination: Goal: Will not experience complications related to bowel motility Outcome: Progressing   Problem: Elimination: Goal: Will not experience complications related to urinary retention Outcome: Progressing   Problem: Skin Integrity: Goal: Risk for impaired skin integrity will decrease Outcome: Progressing   Problem: Nutrition: Goal: Dietary intake will improve Outcome: Progressing   Problem: Activity: Goal: Ability to tolerate increased activity will improve Outcome: Progressing  05/30/2024 Alva Jewels, RN, BSN 5:02 PM

## 2024-05-31 ENCOUNTER — Inpatient Hospital Stay (HOSPITAL_COMMUNITY)

## 2024-05-31 DIAGNOSIS — I69391 Dysphagia following cerebral infarction: Secondary | ICD-10-CM | POA: Diagnosis not present

## 2024-05-31 DIAGNOSIS — I10 Essential (primary) hypertension: Secondary | ICD-10-CM | POA: Diagnosis not present

## 2024-05-31 DIAGNOSIS — I6522 Occlusion and stenosis of left carotid artery: Secondary | ICD-10-CM | POA: Diagnosis not present

## 2024-05-31 DIAGNOSIS — I63512 Cerebral infarction due to unspecified occlusion or stenosis of left middle cerebral artery: Secondary | ICD-10-CM | POA: Diagnosis not present

## 2024-05-31 LAB — BASIC METABOLIC PANEL WITH GFR
Anion gap: 13 (ref 5–15)
BUN: 17 mg/dL (ref 6–20)
CO2: 25 mmol/L (ref 22–32)
Calcium: 9 mg/dL (ref 8.9–10.3)
Chloride: 105 mmol/L (ref 98–111)
Creatinine, Ser: 0.75 mg/dL (ref 0.44–1.00)
GFR, Estimated: >60 mL/min (ref >60)
Glucose, Bld: 142 mg/dL — ABNORMAL HIGH (ref 70–99)
Potassium: 3.2 mmol/L — ABNORMAL LOW (ref 3.5–5.1)
Sodium: 143 mmol/L (ref 135–145)

## 2024-05-31 LAB — PHOSPHORUS: Phosphorus: 3.3 mg/dL (ref 2.5–4.6)

## 2024-05-31 LAB — GLUCOSE, CAPILLARY
Glucose-Capillary: 196 mg/dL — ABNORMAL HIGH (ref 70–99)
Glucose-Capillary: 158 mg/dL — ABNORMAL HIGH (ref 70–99)
Glucose-Capillary: 153 mg/dL — ABNORMAL HIGH (ref 70–99)
Glucose-Capillary: 147 mg/dL — ABNORMAL HIGH (ref 70–99)
Glucose-Capillary: 173 mg/dL — ABNORMAL HIGH (ref 70–99)

## 2024-05-31 LAB — MAGNESIUM: Magnesium: 1.8 mg/dL (ref 1.7–2.4)

## 2024-05-31 MED ORDER — MAGNESIUM SULFATE 2 GM/50ML IV SOLN
2.0000 g | Freq: Once | INTRAVENOUS | Status: AC
  Administered 2024-05-31: 2 g via INTRAVENOUS
  Filled 2024-05-31: qty 50

## 2024-05-31 MED ORDER — AMLODIPINE BESYLATE 5 MG PO TABS
5.0000 mg | ORAL_TABLET | Freq: Every day | ORAL | Status: DC
  Administered 2024-05-31 – 2024-06-04 (×4): 5 mg
  Filled 2024-05-31 (×2): qty 1
  Filled 2024-05-31: qty 2
  Filled 2024-05-31 (×2): qty 1

## 2024-05-31 MED ORDER — PROSOURCE TF20 ENFIT COMPATIBL EN LIQD
60.0000 mL | Freq: Two times a day (BID) | ENTERAL | Status: DC
  Administered 2024-05-31 – 2024-06-04 (×8): 60 mL
  Filled 2024-05-31 (×8): qty 60

## 2024-05-31 MED ORDER — POTASSIUM CHLORIDE 20 MEQ PO PACK
40.0000 meq | PACK | ORAL | Status: AC
  Administered 2024-05-31 (×2): 40 meq
  Filled 2024-05-31 (×2): qty 2

## 2024-05-31 MED ORDER — OSMOLITE 1.5 CAL PO LIQD
996.0000 mL | ORAL | Status: DC
  Administered 2024-05-31: 996 mL
  Administered 2024-06-01 – 2024-06-03 (×3): 1000 mL
  Filled 2024-05-31: qty 1000

## 2024-05-31 MED ORDER — AMLODIPINE BESYLATE 5 MG PO TABS: 5.0000 mg | ORAL_TABLET | Freq: Every day | ORAL | Status: DC

## 2024-05-31 NOTE — TOC Progression Note (Signed)
 Transition of Care [facility]) - Progression Note    Patient Details  Name: Rita [name] MRN: [id number] Date of Birth: 02-Aug-1966  [facility]) CM/SW Contact  Jannice Mends, LCSW Phone Number: 05/31/2024, 1:29 PM  Clinical Narrative:    CSW received request for SNF placement as patient does not have adequate home support. Will send referral out once Cortrak is removed.    Expected Discharge Plan: SNF Barriers to Discharge: Continued Medical Work up, substance use, insurance authorization, SNF pending bed offer  Expected Discharge Plan and Services   Discharge Planning Services: CM Consult   Living arrangements for the past 2 months: Single Family Home                                       Social Determinants of Health (SDOH) Interventions SDOH Screenings   Food Insecurity: Patient Unable To Answer (05/28/2024)  Housing: Patient Unable To Answer (05/28/2024)  Transportation Needs: Patient Unable To Answer (05/28/2024)  Utilities: Patient Unable To Answer (05/28/2024)  Tobacco Use: High Risk (05/27/2024)    Readmission Risk Interventions     No data to display

## 2024-05-31 NOTE — Progress Notes (Signed)
 Speech Language Pathology Treatment: Dysphagia;Cognitive-Linguistic  Patient Details Name: Rita [name] MRN: [id number] DOB: 05/20/1966 Today's Date: 05/31/2024 Time: 2952-8413 SLP Time Calculation (min) (ACUTE ONLY): 15 min  Assessment / Plan / Recommendation Clinical Impression  Pt was sitting on the EOB upon SLP arrival with anterior loss of grits from the R side of her oral cavity. She was given additional boluses of thin liquids and purees with intermittent, delayed coughing observed. When given a small piece of graham cracker this was left unmasticated and became pocketed in her R buccal cavity/coming out of her lips. It was manually removed. Orally she is likely appropriate to advance to purees, but given intermittent coughing, and pt's difficulty communicating how she is feeling, recommend proceeding with MBS for more thorough evaluation. RN did not note any coughing with thin liquids today so will leave full liquid/thin liquid order in place pending completion.   From a communication standpoint: pt is using head nods to respond to yes/no questions but with limited accuracy. When given written Yes/No options, she is 100% accurate with basic, simple questions but does still make some errors with more abstract questioning. She imitated some labial movements and then added voicing to it to produce /a/ but had more difficulty attempting this with other phonemes. She became tearful when asked her name and did not attempt it after a model. Will plan to assess potential for simple communication board on subsequent visit.    HPI HPI: Rita [name] is a 58 yo female presenting to [facility] with R sided weakness and aphasia. CTA showed occlusion of the L ICA, L vertebral artery and L subclavian artery occlusions. TNK administered and transferred to [facility] for thrombectomy. Remained intubated after the procedure, 6/2-6/4. MRI shows large L MCA ischemic infarct and nonhemorrhagic L lentiform nucleus and  periventricular white matter infarcts. PMH includes HTN, HLD, prior CVA s/p L carotid subclavian bypass graft, COPD, tobacco use, GERD with Barrett's esophagus, chronic pancreatitis, chronic opioid use, depression      SLP Plan  MBS          Recommendations  Diet recommendations: Thin liquid (full liquids) Liquids provided via: Cup;Straw Medication Administration: Crushed with puree Supervision: Patient able to self feed;Full supervision/cueing for compensatory strategies Compensations: Slow rate;Small sips/bites Postural Changes and/or Swallow Maneuvers: Seated upright 90 degrees;Upright 30-60 min after meal                  Oral care QID;Oral care prior to ice chip/H20;Staff/trained caregiver to provide oral care   Frequent or constant Supervision/Assistance Dysphagia, unspecified (R13.10)     MBS     Beth Brooke., M.A. CCC-SLP Acute Rehabilitation Services Office: [phone]  Secure chat preferred   05/31/2024, 12:36 PM

## 2024-05-31 NOTE — Progress Notes (Signed)
 Nutrition Follow-up  DOCUMENTATION CODES:   Not applicable  INTERVENTION:  Adjust TF to nocturnal  Tube feeds via cortrak tube:  Osmolite 1.5 @ 83 mL/hr  x 12 hours (1800 - 0600)  60 mL ProSource TF20 - BID  Regimen at goal provides 1654 kcal, 102 gm protein, and 756 mL free water  daily.   Encourage PO intake, monitor intake and further adjust TF as appropriate.    NUTRITION DIAGNOSIS:   Inadequate oral intake related to acute illness as evidenced by meal completion < 50%. Ongoing.   GOAL:   Patient will meet greater than or equal to 90% of their needs Progressing  MONITOR:   PO intake, TF tolerance, Labs  REASON FOR ASSESSMENT:   Ventilator    ASSESSMENT:   58 y.o. female presented to [facility] with R sided weakness and transferred to [facility]. PMH includes HTN, HLD, CVA, chronic pancreatitis, COPD, GERD, Barrett's esophagus, and tobacco abuse. Pt admitted with L ischemic CVA.   Pt transferred out of ICU. Spoke to pt who appears to be aphasic but does seem to understand questions and statements. Spoke with SLP who is upgrading pt's diet from full liquids to Dysphagia 1 with nectar thickened liquids. Will adjust to nocturnal TF.  Spoke with pt's RN.   6/02 - Admitted; s/p L common carotid arteriogram; Intubated 6/04 - extubated; cortrak placed 6/05 - tx out of ICU and diet advanced   Nutrition Related Medications: NovoLog  0-9 units q4h, Protonix , Potassium Chloride  x 3 Drips Mag sulfate x 1  Labs:  K 2.9 -> 3.2 TG 781 A1C 6.3 CBG: 153-196 mg/dL x 24 hrs    Diet Order:   Diet Order             DIET - DYS 1 Room service appropriate? No; Fluid consistency: Nectar Thick  Diet effective now                   EDUCATION NEEDS:   Education needs have been addressed  Skin:  Skin Assessment: Reviewed RN Assessment  Last BM:  6/5 medium; type 6  Height:  Ht Readings from Last 1 Encounters:  03/31/23 5\' 7"  (1.702 m)   Weight:  Wt Readings from Last 1  Encounters:  05/31/24 96 kg   Ideal Body Weight:  61.4 kg  BMI:  Body mass index is 33.15 kg/m.  Estimated Nutritional Needs:  Kcal:  1950-2150 Protein:  100-120 grams Fluid:  >/= 2 L  Ginni Eichler P., RD, LDN, CNSC See AMiON for contact information

## 2024-05-31 NOTE — Progress Notes (Addendum)
 STROKE TEAM PROGRESS NOTE    SIGNIFICANT HOSPITAL EVENTS 6/2-patient presented to [facility] with right-sided weakness and aphasia, TNK was administered and patient was transferred here for thrombectomy. TICI 3 revascularization of distal left ICA stent occlusion and MCA achieved, remained intubated after procedure 6/4- Extubated  6/6- transferred out of the ICU  INTERIM HISTORY/SUBJECTIVE Patient remains hemodynamically stable overnight and is afebrile.  She has been transferred out of the ICU.  She is currently awaiting discharge to CIR. She continues to have significant expressive aphasia though she can follow midline and one-step commands well.  She is moving the right side much better though she still has mild weakness of the right grip and weakness in hand muscles. OBJECTIVE  CBC    Component Value Date/Time   WBC 11.9 (H) 05/30/2024 1225   RBC 4.08 05/30/2024 1225   HGB 12.4 05/30/2024 1225   HCT 37.8 05/30/2024 1225   PLT 386 05/30/2024 1225   MCV 92.6 05/30/2024 1225   MCH 30.4 05/30/2024 1225   MCHC 32.8 05/30/2024 1225   RDW 14.9 05/30/2024 1225   LYMPHSABS 0.9 05/28/2024 0548   MONOABS 0.6 05/28/2024 0548   EOSABS 0.0 05/28/2024 0548   BASOSABS 0.0 05/28/2024 0548    BMET    Component Value Date/Time   NA 143 05/31/2024 0832   K 3.2 (L) 05/31/2024 0832   CL 105 05/31/2024 0832   CO2 25 05/31/2024 0832   GLUCOSE 142 (H) 05/31/2024 0832   BUN 17 05/31/2024 0832   CREATININE 0.75 05/31/2024 0832   CREATININE 0.94 02/08/2018 1209   CALCIUM  9.0 05/31/2024 0832   GFRNONAA >60 05/31/2024 0832    IMAGING past 24 hours No results found.   Vitals:   05/31/24 0729 05/31/24 0800 05/31/24 0911 05/31/24 1235  BP:  (!) 110/54 127/66 (!) 94/43  Pulse:  68 66 62  Resp: 15  19 19   Temp:  97.9 F (36.6 C) 97.9 F (36.6 C) 98.3 F (36.8 C)  TempSrc:  Axillary Oral   SpO2:  97% 100% 100%  Weight:         PHYSICAL EXAM General:  Alert, well-nourished,  well-developed patient in no acute distress Psych:  Mood and affect appropriate for situation CV: Regular rate and rhythm on monitor Respiratory: Respirations regular and unlabored on room air  Neuro: Mental Status: Patient is awake, nods yes and no inconsistently. Has receptive and expressive aphasia. Able to mimic gestures.  Cranial Nerves: II: Right hemianopia  III,IV, VI: EOMI without ptosis or diploplia.  VII: Facial movement is symmetric resting and smiling VIII: Hearing is intact to voice XII: Tongue protrudes midline without atrophy or fasciculations.  Motor: Tone is normal. Bulk is normal.  RUE and RLE weaker than left but antigravity movement Appears to have full strength on the left  Sensory: Localizes bilaterally  Cerebellar: Unable to follow commands to complete FNF  Gait:  assisted     .   ASSESSMENT/PLAN  Rita [name] is a 58 y.o. female with history of smoking, left carotid subclavian bypass graft, right lower lung mass, COPD, chronic pancreatitis, depression, GERD, hyperlipidemia, hypertension and previous stroke admitted for acute onset right-sided weakness and aphasia.  Patient was given TNK at [facility] and was transferred here for thrombectomy.  Thrombectomy was successful with revascularization of left ICA and left M1 segment achieved with 3 passes.  NIH on Admission 18  Acute Ischemic Infarct:  left MCA territory stroke due to left distal  ICA stent and MCA occlusion s/p mechanical thrombectomy and TNK Etiology: Large vessel occlusion- left distal ICA stent occlusion Code Stroke CT head No acute abnormality. ASPECTS 10.    CTA head & neck occlusion of the left ICA from proximal cervical segment to ICA terminus, left common carotid to left subclavian bypass noted, moderate stenosis of right supraclinoid ICA, focal moderate stenosis of proximal M2 branch of left MCA, moderate stenosis of A2 segment of left ACA Post IR CT no acute  abnormality MRI - Large acute nonhemorrhagic left MCA territory ischemic infarct. 3. Acute nonhemorrhagic infarct in the posterior left lentiform nucleus. Acute nonhemorrhagic left periventricular white matter infarcts. Remote cortical infarcts in the left parietal lobe and along the watershed distribution over the left hemisphere. Remote inferior cerebellar infarcts, right greater than left. Moderate mucosal disease in the sphenoid sinus bilaterally and posterior maxillary sinuses. Mild mucosal thickening in the anterior ethmoid air cells and inferior frontal sinuses. 2D Echo EF 65 to 70%, grade 1 diastolic dysfunction, no interatrial shunt, normal left atrial size LDL 97 HgbA1c 6.3 VTE prophylaxis -SCDs aspirin  81 mg daily and Brilinta  (ticagrelor ) 90 mg bid prior to admission, now on back on aspirin  and Brilinta    administration Therapy recommendations:  Pending Disposition: Pending  Hx of Stroke/TIA Patient has a history of TIA as well as previous stroke in the left cerebral hemisphere and cerebellum  Hypertension Home meds: None Unstable, requiring nicardipine Blood Pressure Goal: SBP 120-160 for first 24 hours then less than 180   Hyperlipidemia Home meds: Atorvastatin  80 mg daily and ezetimibe  10 mg daily, resumed in hospital LDL- 97 Continue statin at discharge  Diabetes type II Controlled Home meds: None HgbA1c 6.3, goal < 7.0 CBGs SSI Recommend close follow-up with PCP for better DM control  Tobacco Abuse Patient smokes 0.5 packs per day for 41 years Will assess readiness to quit when patient extubated  Substance Abuse Patient uses cocaine and THC UDS positive for  THC  Cocaine Will assess readiness to quit when patient extubated TOC consult for cessation placed  Dysphagia Patient has post-stroke dysphagia, SLP consulted    Diet   DIET - DYS 1 Room service appropriate? No; Fluid consistency: Nectar Thick  Cortrak in place Advance diet as  tolerated  Respiratory failure Patient left intubated after procedure Extubated 6/4 CCM signed off 6/5-deferred out of ICU 6/6  Other Stroke Risk Factors Obesity, Body mass index is 33.15 kg/m., BMI >/= 30 associated with increased stroke risk, recommend weight loss, diet and exercise as appropriate   Other Active Problems Hypokalemia 2.9 -> 3.2 -> repleted BMP in the morning   Hospital day # 4  Patient seen and examined by NP/APP with MD. MD to update note as needed.   Cortney E Bucky Cardinal , MSN, AGACNP-BC [facility] See Amion for schedule and pager information 05/31/2024 12:48 PM   I have personally obtained history,examined this patient, reviewed notes, independently viewed imaging studies, participated in medical decision making and plan of care.ROS completed by me personally and pertinent positives fully documented  I have made any additions or clarifications directly to the above note.  Continue aspirin  and Brilinta .  Patient encouraged to eat and if she does well will discontinue feeding tube tomorrow.  Continue ongoing therapies.  Transfer to rehab in the next few days after insurance approval and if bed available.  Greater than 50% time during this 35-minute visit was spent in counseling coordination of care and discussion with patient and  care team about her stroke and answering questions  Ardella Beaver, MD Medical Director [facility] Pager: [phone] 05/31/2024 2:35 PM   To contact Stroke Continuity provider, please refer to [email or web address]. After hours, contact General Neurology

## 2024-05-31 NOTE — Progress Notes (Signed)
 Inpatient Rehabilitation Admissions Coordinator   I contacted patient's daughter, Landa Pine, by phone to follow up on our conversation from yesterday. She states she and family would like to pursue SNF at this time. She has been looking at SNFs in the [place] area. I will notify acute team and TOC of family preference. Family can not provide 24/7 caregiver supports after a CIR admit and prefer SNF at this time. We will sign off.  Jeannetta Millman, RN, MSN Rehab Admissions Coordinator [phone] 05/31/2024 12:20 PM

## 2024-05-31 NOTE — Care Management Important Message (Signed)
 Important Message  Patient Details  Name: Rita [name] MRN: [id number] Date of Birth: [birth year]   Important Message Given:  Yes - Medicare IM     Wynonia Hedges 05/31/2024, 11:44 AM

## 2024-05-31 NOTE — Evaluation (Signed)
 Modified Barium Swallow Study  Patient Details  Name: Rita [name] MRN: [id number] Date of Birth: [birth year]  Today's Date: 05/31/2024  Modified Barium Swallow completed.  Full report located under Chart Review in the Imaging Section.  History of Present Illness Rita [name] is a 58 yo female presenting to [facility] with R sided weakness and aphasia. CTA showed occlusion of the L ICA, L vertebral artery and L subclavian artery occlusions. TNK administered and transferred to [facility] for thrombectomy. Remained intubated after the procedure, 6/2-6/4. MRI shows large L MCA ischemic infarct and nonhemorrhagic L lentiform nucleus and periventricular white matter infarcts. PMH includes HTN, HLD, prior CVA s/p L carotid subclavian bypass graft, COPD, tobacco use, GERD with Barrett's esophagus, chronic pancreatitis, chronic opioid use, depression   Clinical Impression Swallow study was limited by reduced visibility at the level of the vocal folds and UES due to shadow from shoulder. Pt had difficulty following commands to adjust positioning and even when anatomy was more visible, she did not stay in that position long. Her oral phase was fairly appropriate with liquid and pureed boluses, with only trace residue along the tongue and base of tongue, and minimal interlabial escape, and slow lingual transit with purees. Incomplete velopharyngeal closure was observed with small amounts of boluses flowing upward between the soft palate and posterior pharyngeal wall, but then falling back down into her pharynx. Pharyngeally there is reduced movement of the hyoid, larynx, and epiglottis. Base of tongue retraction and pharyngeal squeeze are reduced. This impacts airway closure more than it does her efficiency. She has pretty consistent penetration across consistencies, with ejection of penetrates with purees (PAS 2) and a little frank penetration with nectar and honey (PAS 3), although with most of the barium clearing  spontaneously. Pt had intermittent coughing with thin liquids that was concerning for aspiration but could not be visualized, but when able to see the vocal folds, there were instances when a trace amount of thin liquids reached below them (PAS 7 or 8). Given current cognitive/communicative difficulties, she is not really appropriate for compensatory strategies at this time. Recommend that she advance to puree diet but with nectar (mildly) thick liquids.  Factors that may increase risk of adverse event in presence of aspiration Roderick Civatte & Jessy Morocco 2021): Reduced cognitive function;Dependence for feeding and/or oral hygiene  Swallow Evaluation Recommendations Recommendations: PO diet PO Diet Recommendation: Dysphagia 1 (Pureed);Mildly thick liquids (Level 2, nectar thick) Liquid Administration via: Cup;Straw Medication Administration: Crushed with puree Supervision: Staff to assist with self-feeding;Full supervision/cueing for swallowing strategies Swallowing strategies  : Minimize environmental distractions;Slow rate;Small bites/sips;Check for pocketing or oral holding;Check for anterior loss Postural changes: Position pt fully upright for meals Oral care recommendations: Oral care BID (2x/day) Caregiver Recommendations: Avoid jello, ice cream, thin soups, popsicles;Remove water  pitcher      Beth Brooke., M.A. CCC-SLP Acute Rehabilitation Services Office: [phone]  Secure chat preferred  05/31/2024,1:18 PM

## 2024-06-01 DIAGNOSIS — Z7902 Long term (current) use of antithrombotics/antiplatelets: Secondary | ICD-10-CM

## 2024-06-01 DIAGNOSIS — F1410 Cocaine abuse, uncomplicated: Secondary | ICD-10-CM | POA: Diagnosis not present

## 2024-06-01 DIAGNOSIS — I69391 Dysphagia following cerebral infarction: Secondary | ICD-10-CM | POA: Diagnosis not present

## 2024-06-01 DIAGNOSIS — I739 Peripheral vascular disease, unspecified: Secondary | ICD-10-CM

## 2024-06-01 DIAGNOSIS — I779 Disorder of arteries and arterioles, unspecified: Secondary | ICD-10-CM

## 2024-06-01 DIAGNOSIS — F17210 Nicotine dependence, cigarettes, uncomplicated: Secondary | ICD-10-CM | POA: Diagnosis not present

## 2024-06-01 DIAGNOSIS — Z7982 Long term (current) use of aspirin: Secondary | ICD-10-CM

## 2024-06-01 DIAGNOSIS — I63512 Cerebral infarction due to unspecified occlusion or stenosis of left middle cerebral artery: Secondary | ICD-10-CM | POA: Diagnosis not present

## 2024-06-01 LAB — CBC
HCT: 36.5 % (ref 36.0–46.0)
Hemoglobin: 11.8 g/dL — ABNORMAL LOW (ref 12.0–15.0)
MCH: 29.6 pg (ref 26.0–34.0)
MCHC: 32.3 g/dL (ref 30.0–36.0)
MCV: 91.7 fL (ref 80.0–100.0)
Platelets: 396 10*3/uL (ref 150–400)
RBC: 3.98 MIL/uL (ref 3.87–5.11)
RDW: 14.1 % (ref 11.5–15.5)
WBC: 12 10*3/uL — ABNORMAL HIGH (ref 4.0–10.5)
nRBC: 0 % (ref 0.0–0.2)

## 2024-06-01 LAB — GLUCOSE, CAPILLARY
Glucose-Capillary: 161 mg/dL — ABNORMAL HIGH (ref 70–99)
Glucose-Capillary: 198 mg/dL — ABNORMAL HIGH (ref 70–99)
Glucose-Capillary: 106 mg/dL — ABNORMAL HIGH (ref 70–99)
Glucose-Capillary: 235 mg/dL — ABNORMAL HIGH (ref 70–99)
Glucose-Capillary: 109 mg/dL — ABNORMAL HIGH (ref 70–99)
Glucose-Capillary: 173 mg/dL — ABNORMAL HIGH (ref 70–99)
Glucose-Capillary: 174 mg/dL — ABNORMAL HIGH (ref 70–99)

## 2024-06-01 LAB — BASIC METABOLIC PANEL WITH GFR
Anion gap: 7 (ref 5–15)
BUN: 21 mg/dL — ABNORMAL HIGH (ref 6–20)
CO2: 29 mmol/L (ref 22–32)
Calcium: 9.3 mg/dL (ref 8.9–10.3)
Chloride: 103 mmol/L (ref 98–111)
Creatinine, Ser: 0.78 mg/dL (ref 0.44–1.00)
GFR, Estimated: >60 mL/min (ref >60)
Glucose, Bld: 147 mg/dL — ABNORMAL HIGH (ref 70–99)
Potassium: 4 mmol/L (ref 3.5–5.1)
Sodium: 139 mmol/L (ref 135–145)

## 2024-06-01 NOTE — Progress Notes (Signed)
 STROKE TEAM PROGRESS NOTE    SIGNIFICANT HOSPITAL EVENTS 6/2-patient presented to [facility] with right-sided weakness and aphasia, TNK was administered and patient was transferred here for thrombectomy. TICI 3 revascularization of distal left ICA stent occlusion and MCA achieved, remained intubated after procedure 6/4- Extubated  6/6- transferred out of the ICU  INTERIM HISTORY/SUBJECTIVE Patient remains hemodynamically stable overnight and is afebrile.  She has been transferred out of the ICU.  She is currently awaiting discharge to CIR. She continues to have significant expressive aphasia though she can follow midline and one-step commands well.  She is moving the right side much better though she still has mild weakness of the right grip and weakness in hand muscles. OBJECTIVE  CBC    Component Value Date/Time   WBC 12.0 (H) 06/01/2024 1004   RBC 3.98 06/01/2024 1004   HGB 11.8 (L) 06/01/2024 1004   HCT 36.5 06/01/2024 1004   PLT 396 06/01/2024 1004   MCV 91.7 06/01/2024 1004   MCH 29.6 06/01/2024 1004   MCHC 32.3 06/01/2024 1004   RDW 14.1 06/01/2024 1004   LYMPHSABS 0.9 05/28/2024 0548   MONOABS 0.6 05/28/2024 0548   EOSABS 0.0 05/28/2024 0548   BASOSABS 0.0 05/28/2024 0548    BMET    Component Value Date/Time   NA 139 06/01/2024 1004   K 4.0 06/01/2024 1004   CL 103 06/01/2024 1004   CO2 29 06/01/2024 1004   GLUCOSE 147 (H) 06/01/2024 1004   BUN 21 (H) 06/01/2024 1004   CREATININE 0.78 06/01/2024 1004   CREATININE 0.94 02/08/2018 1209   CALCIUM  9.3 06/01/2024 1004   GFRNONAA >60 06/01/2024 1004    IMAGING past 24 hours No results found.   Vitals:   06/01/24 1111 06/01/24 1113 06/01/24 1144 06/01/24 1630  BP:  (!) 109/57 (!) 109/57 107/75  Pulse: (!) 57 (!) 54  63  Resp: 19   19  Temp: 98.7 F (37.1 C)   (!) 97.5 F (36.4 C)  TempSrc: Oral     SpO2: 98% 99%  97%  Weight:         PHYSICAL EXAM General:  Alert, well-nourished,  well-developed patient in no acute distress Psych:  Mood and affect appropriate for situation CV: Regular rate and rhythm on monitor Respiratory: Respirations regular and unlabored on room air  Neuro: Pt is awake, alert, eyes open, global aphasia but able to have sound out but not meaningful, able to follow commands of eye open/close but not other commands but able to pantomime some commands. Not able to name and repeat and read. No gaze palsy, tracking bilaterally, blinking to visual threat on the left but not to the right.  Mild right facial droop. Tongue protrusion not corporative. LUE 5/5 and RUE 4+/5 with ataxic movement, BLE 5/5. Sensation, coordination not corporative and gait not tested.    ASSESSMENT/PLAN  [name] is a 58 y.o. female with history of smoking, left carotid subclavian bypass graft, right lower lung mass, COPD, chronic pancreatitis, depression, GERD, hyperlipidemia, hypertension and previous stroke admitted for acute onset right-sided weakness and aphasia.  Patient was given TNK at [facility] and was transferred here for thrombectomy.  Thrombectomy was successful with revascularization of left ICA and left M1 segment achieved with 3 passes.  NIH on Admission 18  Stroke: Large left MCA territory infarcts due to left distal ICA stent and MCA occlusion s/p TNK and IR with TICI3 reperfusion Etiology: Large vessel disease Code Stroke  CT head No acute abnormality. ASPECTS 10.    CTA head & neck occlusion of the left ICA from proximal cervical segment to ICA terminus, left common carotid to left subclavian bypass noted, moderate stenosis of right supraclinoid ICA, focal moderate stenosis of proximal M2 branch of left MCA, moderate stenosis of A2 segment of left ACA IR with occluded left ICA at the level of the previously positioned siphon flow diverter stent.  Status post IR with TICI3 MRI - Large acute nonhemorrhagic left MCA territory ischemic infarct. Acute  nonhemorrhagic infarct in the posterior left lentiform nucleus. Acute nonhemorrhagic left periventricular white matter infarcts. Remote cortical infarcts in the left parietal lobe and along the watershed distribution over the left hemisphere. Remote inferior cerebellar infarcts, right greater than left. Moderate mucosal disease in the sphenoid sinus bilaterally and posterior maxillary sinuses. Mild mucosal thickening in the anterior ethmoid air cells and inferior frontal sinuses. 2D Echo EF 65 to 70%, grade 1 diastolic dysfunction, no interatrial shunt, normal left atrial size LDL 97 HgbA1c 6.3 UDS positive for cocaine and THC VTE prophylaxis -Lovenox  aspirin  81 mg daily and Brilinta  (ticagrelor ) 90 mg bid prior to admission, now on back on aspirin  and Brilinta   Therapy recommendations:  CIR Disposition: Pending  Hx of Stroke/TIA 12/2018 admitted for left occipital and MCA/ACA infarct.  MRA head and neck showed left VA occlusion, right M1 moderate stenosis, left A1 moderate stenosis.  EF 60 to 65%.  No DVT.  LDL 83, A1c 5.7, UDS positive for THC.  Discharged on DAPT and Lipitor  80 08/2022 admitted for left frontal and parietal watershed infarcts.  CTA head and neck left VA chronic occlusion, right V3 occlusion.  Moderate to severe left siphon and moderate right siphon stenosis.  Status post left ICA stent stenting.  Hypertension Home meds: None Stable now Monitor BP goal normotensive  Hyperlipidemia Home meds: Atorvastatin  80 mg daily and ezetimibe  10 mg daily LDL- 97, goal less than 70 Now home Lipitor  80 and Zetia  10 Continue statin and Zetia  at discharge  Tobacco Abuse Patient smokes 0.5 packs per day for 41 years Will assess readiness to quit when patient aphasia improves  Substance Abuse UDS positive for  THC and cocaine Will assess readiness to quit when patient aphasia improves TOC consult for cessation placed  Dysphagia Patient has post-stroke dysphagia, SLP consulted  now  on dysphagia 1 nectar thick liquid  Cortrak in place On nocturnal tube feeding Advance diet as tolerated  Other Stroke Risk Factors Obesity, Body mass index is 33.15 kg/m., BMI >/= 30 associated with increased stroke risk, recommend weight loss, diet and exercise as appropriate  PAD, history of left CCA to subclavian artery bypass in 2019  Other Active Problems Hypokalemia, 2.9 -> 3.2 -> 4.0 History of right lower lobe mass  Hospital day # 5  Consuelo Denmark, MD PhD Stroke Neurology 06/01/2024 5:31 PM     To contact Stroke Continuity provider, please refer to [email or web address]. After hours, contact General Neurology

## 2024-06-01 NOTE — Progress Notes (Signed)
 Found patient drinking a Dr. Kathlene Paradise with family. Educated family on the nectar thicken liquid recommendation via SLP. Demonstrated the process to daughter and significant other at bedside. Family verbalized understanding.

## 2024-06-02 DIAGNOSIS — I63512 Cerebral infarction due to unspecified occlusion or stenosis of left middle cerebral artery: Secondary | ICD-10-CM | POA: Diagnosis not present

## 2024-06-02 DIAGNOSIS — F17210 Nicotine dependence, cigarettes, uncomplicated: Secondary | ICD-10-CM | POA: Diagnosis not present

## 2024-06-02 DIAGNOSIS — I69391 Dysphagia following cerebral infarction: Secondary | ICD-10-CM | POA: Diagnosis not present

## 2024-06-02 DIAGNOSIS — F1410 Cocaine abuse, uncomplicated: Secondary | ICD-10-CM | POA: Diagnosis not present

## 2024-06-02 LAB — GLUCOSE, CAPILLARY
Glucose-Capillary: 105 mg/dL — ABNORMAL HIGH (ref 70–99)
Glucose-Capillary: 123 mg/dL — ABNORMAL HIGH (ref 70–99)
Glucose-Capillary: 149 mg/dL — ABNORMAL HIGH (ref 70–99)
Glucose-Capillary: 167 mg/dL — ABNORMAL HIGH (ref 70–99)
Glucose-Capillary: 190 mg/dL — ABNORMAL HIGH (ref 70–99)

## 2024-06-02 NOTE — Plan of Care (Signed)
   Problem: Clinical Measurements: Goal: Respiratory complications will improve Outcome: Progressing   Problem: Clinical Measurements: Goal: Cardiovascular complication will be avoided Outcome: Progressing

## 2024-06-02 NOTE — Progress Notes (Signed)
 STROKE TEAM PROGRESS NOTE    SIGNIFICANT HOSPITAL EVENTS 6/2-patient presented to [facility] with right-sided weakness and aphasia, TNK was administered and patient was transferred here for thrombectomy. TICI 3 revascularization of distal left ICA stent occlusion and MCA achieved, remained intubated after procedure 6/4- Extubated  6/6- transferred out of the ICU  INTERIM HISTORY/SUBJECTIVE No family at bedside.  Patient lying in bed, no acute event overnight, neuro stable.  Pending CIR.  OBJECTIVE  CBC    Component Value Date/Time   WBC 12.0 (H) 06/01/2024 1004   RBC 3.98 06/01/2024 1004   HGB 11.8 (L) 06/01/2024 1004   HCT 36.5 06/01/2024 1004   PLT 396 06/01/2024 1004   MCV 91.7 06/01/2024 1004   MCH 29.6 06/01/2024 1004   MCHC 32.3 06/01/2024 1004   RDW 14.1 06/01/2024 1004   LYMPHSABS 0.9 05/28/2024 0548   MONOABS 0.6 05/28/2024 0548   EOSABS 0.0 05/28/2024 0548   BASOSABS 0.0 05/28/2024 0548    BMET    Component Value Date/Time   NA 139 06/01/2024 1004   K 4.0 06/01/2024 1004   CL 103 06/01/2024 1004   CO2 29 06/01/2024 1004   GLUCOSE 147 (H) 06/01/2024 1004   BUN 21 (H) 06/01/2024 1004   CREATININE 0.78 06/01/2024 1004   CREATININE 0.94 02/08/2018 1209   CALCIUM  9.3 06/01/2024 1004   GFRNONAA >60 06/01/2024 1004    IMAGING past 24 hours No results found.   Vitals:   06/02/24 0752 06/02/24 0754 06/02/24 1216 06/02/24 1632  BP:  112/67 94/66 (!) 115/58  Pulse:  64 69 72  Resp:  14 16 18   Temp:  98.6 F (37 C) 98.6 F (37 C) 98.4 F (36.9 C)  TempSrc:  Oral Oral Oral  SpO2: 100% 100% 100% 100%  Weight:         PHYSICAL EXAM General:  Alert, well-nourished, well-developed patient in no acute distress Psych:  Mood and affect appropriate for situation CV: Regular rate and rhythm on monitor Respiratory: Respirations regular and unlabored on room air  Neuro: Pt is awake, alert, eyes open, global aphasia but able to have sound out but not  meaningful, able to follow commands of eye open/close but not other commands but able to pantomime some commands. Not able to name and repeat and read. No gaze palsy, tracking bilaterally, blinking to visual threat on the left but not to the right.  Mild right facial droop. Tongue protrusion not corporative. LUE 5/5 and RUE 4+/5 with ataxic movement, BLE 5/5. Sensation, coordination not corporative and gait not tested.    ASSESSMENT/PLAN  Rita [name] is a 58 y.o. female with history of smoking, left carotid subclavian bypass graft, right lower lung mass, COPD, chronic pancreatitis, depression, GERD, hyperlipidemia, hypertension and previous stroke admitted for acute onset right-sided weakness and aphasia.  Patient was given TNK at [facility] and was transferred here for thrombectomy.  Thrombectomy was successful with revascularization of left ICA and left M1 segment achieved with 3 passes.  NIH on Admission 18  Stroke: Large left MCA territory infarcts due to left distal ICA stent and MCA occlusion s/p TNK and IR with TICI3 reperfusion Etiology: Large vessel disease Code Stroke CT head No acute abnormality. ASPECTS 10.    CTA head & neck occlusion of the left ICA from proximal cervical segment to ICA terminus, left common carotid to left subclavian bypass noted, moderate stenosis of right supraclinoid ICA, focal moderate stenosis of proximal M2 branch  of left MCA, moderate stenosis of A2 segment of left ACA IR with occluded left ICA at the level of the previously positioned siphon flow diverter stent.  Status post IR with TICI3 MRI - Large acute nonhemorrhagic left MCA territory ischemic infarct. Acute nonhemorrhagic infarct in the posterior left lentiform nucleus. Acute nonhemorrhagic left periventricular white matter infarcts. Remote cortical infarcts in the left parietal lobe and along the watershed distribution over the left hemisphere. Remote inferior cerebellar infarcts, right  greater than left. Moderate mucosal disease in the sphenoid sinus bilaterally and posterior maxillary sinuses. Mild mucosal thickening in the anterior ethmoid air cells and inferior frontal sinuses. 2D Echo EF 65 to 70%, grade 1 diastolic dysfunction, no interatrial shunt, normal left atrial size LDL 97 HgbA1c 6.3 UDS positive for cocaine and THC VTE prophylaxis -Lovenox  aspirin  81 mg daily and Brilinta  (ticagrelor ) 90 mg bid prior to admission, now on back on aspirin  and Brilinta   Therapy recommendations:  CIR Disposition: Pending  Hx of Stroke/TIA 12/2018 admitted for left occipital and MCA/ACA infarct.  MRA head and neck showed left VA occlusion, right M1 moderate stenosis, left A1 moderate stenosis.  EF 60 to 65%.  No DVT.  LDL 83, A1c 5.7, UDS positive for THC.  Discharged on DAPT and Lipitor  80 08/2022 admitted for left frontal and parietal watershed infarcts.  CTA head and neck left VA chronic occlusion, right V3 occlusion.  Moderate to severe left siphon and moderate right siphon stenosis.  Status post left ICA stent stenting.  Hypertension Home meds: None Stable now Monitor BP goal normotensive  Hyperlipidemia Home meds: Atorvastatin  80 mg daily and ezetimibe  10 mg daily LDL- 97, goal less than 70 Now home Lipitor  80 and Zetia  10 Continue statin and Zetia  at discharge  Tobacco Abuse Patient smokes 0.5 packs per day for 41 years Will assess readiness to quit when patient aphasia improves  Substance Abuse UDS positive for  THC and cocaine Will assess readiness to quit when patient aphasia improves TOC consult for cessation placed  Dysphagia Patient has post-stroke dysphagia, SLP consulted  now on dysphagia 1 nectar thick liquid  Cortrak in place On nocturnal tube feeding Advance diet as tolerated  Other Stroke Risk Factors Obesity, Body mass index is 33.15 kg/m., BMI >/= 30 associated with increased stroke risk, recommend weight loss, diet and exercise as appropriate   PAD, history of left CCA to subclavian artery bypass in 2019  Other Active Problems Hypokalemia, 2.9 -> 3.2 -> 4.0 History of right lower lobe mass  Hospital day # 6  Consuelo Denmark, MD PhD Stroke Neurology 06/02/2024 5:04 PM     To contact Stroke Continuity provider, please refer to [email or web address]. After hours, contact General Neurology

## 2024-06-03 ENCOUNTER — Other Ambulatory Visit: Payer: Self-pay | Admitting: Student

## 2024-06-03 DIAGNOSIS — I6522 Occlusion and stenosis of left carotid artery: Secondary | ICD-10-CM

## 2024-06-03 DIAGNOSIS — I69391 Dysphagia following cerebral infarction: Secondary | ICD-10-CM | POA: Diagnosis not present

## 2024-06-03 DIAGNOSIS — I63512 Cerebral infarction due to unspecified occlusion or stenosis of left middle cerebral artery: Secondary | ICD-10-CM | POA: Diagnosis not present

## 2024-06-03 DIAGNOSIS — I639 Cerebral infarction, unspecified: Secondary | ICD-10-CM

## 2024-06-03 DIAGNOSIS — F17210 Nicotine dependence, cigarettes, uncomplicated: Secondary | ICD-10-CM | POA: Diagnosis not present

## 2024-06-03 DIAGNOSIS — F1410 Cocaine abuse, uncomplicated: Secondary | ICD-10-CM | POA: Diagnosis not present

## 2024-06-03 LAB — GLUCOSE, CAPILLARY
Glucose-Capillary: 106 mg/dL — ABNORMAL HIGH (ref 70–99)
Glucose-Capillary: 126 mg/dL — ABNORMAL HIGH (ref 70–99)
Glucose-Capillary: 148 mg/dL — ABNORMAL HIGH (ref 70–99)
Glucose-Capillary: 153 mg/dL — ABNORMAL HIGH (ref 70–99)
Glucose-Capillary: 166 mg/dL — ABNORMAL HIGH (ref 70–99)
Glucose-Capillary: 205 mg/dL — ABNORMAL HIGH (ref 70–99)

## 2024-06-03 NOTE — TOC Progression Note (Signed)
 Transition of Care [facility]) - Progression Note    Patient Details  Name: Rita [name] MRN: [id number] Date of Birth: [birth year]  Transition of Care [facility]) CM/SW Contact  Tandy Fam, [place] Phone Number: 06/03/2024, 9:55 AM  Clinical Narrative:   Per medical team, patient's intake has improved, hopeful to remove cortrak soon. CSW completed FL2 and faxed out, awaiting bed offers.    Expected Discharge Plan: Skilled Nursing Facility Barriers to Discharge: Continued Medical Work up, English as a second language teacher, SNF Pending bed offer (substance use)  Expected Discharge Plan and Services In-house Referral: Clinical Social Work Discharge Planning Services: [employer] Consult Post Acute Care Choice: Skilled Nursing Facility Living arrangements for the past 2 months: Single Family Home                                       Social Determinants of Health (SDOH) Interventions SDOH Screenings   Food Insecurity: Patient Unable To Answer (05/28/2024)  Housing: Patient Unable To Answer (05/28/2024)  Transportation Needs: Patient Unable To Answer (05/28/2024)  Utilities: Patient Unable To Answer (05/28/2024)  Tobacco Use: High Risk (05/27/2024)    Readmission Risk Interventions     No data to display

## 2024-06-03 NOTE — Plan of Care (Signed)
 Problem: Health Behavior/Discharge Planning: Goal: Ability to manage health-related needs will improve Outcome: Progressing   Problem: Clinical Measurements: Goal: Ability to maintain clinical measurements within normal limits will improve Outcome: Progressing Goal: Will remain free from infection Outcome: Progressing Goal: Diagnostic test results will improve Outcome: Progressing Goal: Respiratory complications will improve Outcome: Progressing Goal: Cardiovascular complication will be avoided Outcome: Progressing   Problem: Activity: Goal: Risk for activity intolerance will decrease Outcome: Progressing   Problem: Nutrition: Goal: Adequate nutrition will be maintained Outcome: Progressing   Problem: Coping: Goal: Level of anxiety will decrease Outcome: Progressing   Problem: Elimination: Goal: Will not experience complications related to bowel motility Outcome: Progressing Goal: Will not experience complications related to urinary retention Outcome: Progressing   Problem: Pain Managment: Goal: General experience of comfort will improve and/or be controlled Outcome: Progressing   Problem: Safety: Goal: Ability to remain free from injury will improve Outcome: Progressing   Problem: Skin Integrity: Goal: Risk for impaired skin integrity will decrease Outcome: Progressing   Problem: Education: Goal: Knowledge of disease or condition will improve Outcome: Progressing Goal: Knowledge of secondary prevention will improve (MUST DOCUMENT ALL) Outcome: Progressing Goal: Knowledge of patient specific risk factors will improve (DELETE if not current risk factor) Outcome: Progressing   Problem: Ischemic Stroke/TIA Tissue Perfusion: Goal: Complications of ischemic stroke/TIA will be minimized Outcome: Progressing   Problem: Coping: Goal: Will verbalize positive feelings about self Outcome: Progressing Goal: Will identify appropriate support needs Outcome:  Progressing   Problem: Health Behavior/Discharge Planning: Goal: Ability to manage health-related needs will improve Outcome: Progressing Goal: Goals will be collaboratively established with patient/family Outcome: Progressing   Problem: Self-Care: Goal: Ability to participate in self-care as condition permits will improve Outcome: Progressing Goal: Verbalization of feelings and concerns over difficulty with self-care will improve Outcome: Progressing Goal: Ability to communicate needs accurately will improve Outcome: Progressing   Problem: Nutrition: Goal: Risk of aspiration will decrease Outcome: Progressing Goal: Dietary intake will improve Outcome: Progressing   Problem: Activity: Goal: Ability to tolerate increased activity will improve Outcome: Progressing   Problem: Respiratory: Goal: Ability to maintain a clear airway and adequate ventilation will improve Outcome: Progressing   Problem: Role Relationship: Goal: Method of communication will improve Outcome: Progressing   Problem: Education: Goal: Understanding of CV disease, CV risk reduction, and recovery process will improve Outcome: Progressing Goal: Individualized Educational Video(s) Outcome: Progressing   Problem: Activity: Goal: Ability to return to baseline activity level will improve Outcome: Progressing   Problem: Cardiovascular: Goal: Ability to achieve and maintain adequate cardiovascular perfusion will improve Outcome: Progressing Goal: Vascular access site(s) Level 0-1 will be maintained Outcome: Progressing   Problem: Health Behavior/Discharge Planning: Goal: Ability to safely manage health-related needs after discharge will improve Outcome: Progressing   Problem: Education: Goal: Ability to describe self-care measures that may prevent or decrease complications (Diabetes Survival Skills Education) will improve Outcome: Progressing Goal: Individualized Educational Video(s) Outcome:  Progressing   Problem: Coping: Goal: Ability to adjust to condition or change in health will improve Outcome: Progressing   Problem: Fluid Volume: Goal: Ability to maintain a balanced intake and output will improve Outcome: Progressing   Problem: Health Behavior/Discharge Planning: Goal: Ability to identify and utilize available resources and services will improve Outcome: Progressing Goal: Ability to manage health-related needs will improve Outcome: Progressing   Problem: Metabolic: Goal: Ability to maintain appropriate glucose levels will improve Outcome: Progressing   Problem: Nutritional: Goal: Maintenance of adequate nutrition will improve Outcome: Progressing Goal:  Progress toward achieving an optimal weight will improve Outcome: Progressing   Problem: Skin Integrity: Goal: Risk for impaired skin integrity will decrease Outcome: Progressing

## 2024-06-03 NOTE — NC FL2 (Signed)
 [place]  MEDICAID FL2 LEVEL OF CARE FORM     IDENTIFICATION  Patient Name: Rita [name] Birthdate: [birth year] Sex: female Admission Date (Current Location): 05/27/2024  [facility] and [place] Number:  [employer] and Address:  The [place]. [facility], [address] [place], [place] [address]      Provider Number: [id number]  Attending Physician Name and Address:  Stroke, Md, MD  Relative Name and Phone Number:       Current Level of Care: Hospital Recommended Level of Care: Skilled Nursing Facility Prior Approval Number:    Date Approved/Denied:   PASRR Number: [id number]  Discharge Plan: SNF    Current Diagnoses: Patient Active Problem List   Diagnosis Date Noted   Middle cerebral artery embolism, left 05/27/2024   Acute hypoxic respiratory failure (HCC) 05/27/2024   Acute CVA (cerebrovascular accident) (HCC) 03/31/2023   Internal carotid artery stenosis, left 09/08/2022   Opioid dependence (HCC) 09/02/2022   Obesity, Class II, BMI 35-39.9 09/02/2022   Lung mass 09/02/2022   Impaired glucose tolerance 09/02/2022   Acute ischemic stroke (HCC) 09/01/2022   Mixed hyperlipidemia 09/01/2022   Cavitating mass in right lower lung lobe (PNA Vs Cancer) 01/21/2019   Gastroenteritis 01/20/2019   Left sided cerebral hemisphere cerebrovascular accident (CVA) (HCC) 01/20/2019   Stenosis of left subclavian artery (HCC) 03/19/2018   Chest pain 02/21/2018   Left subclavian artery occlusion 02/21/2018   Rectal bleeding 03/21/2017   Hiatal hernia with gastroesophageal reflux disease and esophagitis    Biliary pain    Pain of upper abdomen    Elevated liver enzymes    Chronic abdominal pain    Pancreatitis 02/06/2015   Acute pancreatitis    Elevated LFTs    Pancreatitis, acute 05/09/2013   Hypokalemia 03/27/2013   Anemia 03/27/2013   Esophageal dysphagia 03/26/2013   Chronic back pain 03/26/2013   GERD (gastroesophageal reflux disease) 03/26/2013    Tobacco use 03/26/2013   Barrett's esophagus with esophagitis 03/26/2013   Elevated lipase 08/20/2011    Orientation RESPIRATION BLADDER Height & Weight     Self  Normal Continent Weight: 211 lb 10.3 oz (96 kg) Height:     BEHAVIORAL SYMPTOMS/MOOD NEUROLOGICAL BOWEL NUTRITION STATUS      Continent Diet (see dc summary)  AMBULATORY STATUS COMMUNICATION OF NEEDS Skin   Limited Assist Verbally Normal                       Personal Care Assistance Level of Assistance  Bathing, Feeding, Dressing Bathing Assistance: Limited assistance Feeding assistance: Limited assistance Dressing Assistance: Limited assistance     Functional Limitations Info  Speech     Speech Info: Impaired (aphasia)    SPECIAL CARE FACTORS FREQUENCY  PT (By licensed PT), OT (By licensed OT), Speech therapy     PT Frequency: 5x/week OT Frequency: 5x/week     Speech Therapy Frequency: 5x/wk      Contractures Contractures Info: Not present    Additional Factors Info  Code Status, Allergies Code Status Info: Full Allergies Info: NKA           Current Medications (06/03/2024):  This is the current hospital active medication list Current Facility-Administered Medications  Medication Dose Route Frequency Provider Last Rate Last Admin   acetaminophen  (TYLENOL ) tablet 650 mg  650 mg Oral Q4H PRN Stack, Colleen M, MD       Or   acetaminophen  (TYLENOL ) 160 MG/5ML solution 650 mg  650  mg Per Tube Q4H PRN Stack, Colleen M, MD   650 mg at 05/30/24 2112   Or   acetaminophen  (TYLENOL ) suppository 650 mg  650 mg Rectal Q4H PRN Stack, Colleen M, MD       amLODipine  (NORVASC ) tablet 5 mg  5 mg Per Tube Daily Sethi, Pramod S, MD   5 mg at 06/02/24 6578   arformoterol  (BROVANA ) nebulizer solution 15 mcg  15 mcg Nebulization BID Babcock, Peter E, NP   15 mcg at 06/03/24 0830   aspirin  chewable tablet 81 mg  81 mg Oral Daily Deveshwar, Sanjeev, MD   81 mg at 06/02/24 0907   Or   aspirin  chewable tablet 81 mg   81 mg Per Tube Daily Deveshwar, Sanjeev, MD   81 mg at 06/01/24 1049   atorvastatin  (LIPITOR ) tablet 80 mg  80 mg Per Tube q1800 Desai, Rahul P, PA-C   80 mg at 06/02/24 1813   budesonide  (PULMICORT ) nebulizer solution 0.5 mg  0.5 mg Nebulization BID Babcock, Peter E, NP   0.5 mg at 06/03/24 0830   buprenorphine  (SUBUTEX ) sublingual tablet 8 mg  8 mg Sublingual TID Sethi, Pramod S, MD   8 mg at 06/02/24 2135   Chlorhexidine  Gluconate Cloth 2 % PADS 6 each  6 each Topical Daily Star [place], PA-C   6 each at 06/02/24 2135   docusate (COLACE) 50 MG/5ML liquid 100 mg  100 mg Per Tube BID PRN Babcock, Peter E, NP       enoxaparin  (LOVENOX ) injection 40 mg  40 mg Subcutaneous QHS Imogene Mana, NP   40 mg at 06/02/24 2134   ezetimibe  (ZETIA ) tablet 10 mg  10 mg Per Tube Daily Desai, Rahul P, PA-C   10 mg at 06/02/24 0906   feeding supplement (OSMOLITE 1.5 CAL) liquid 996 mL  996 mL Per Tube Continuous Sethi, Pramod S, MD 83 mL/hr at 06/02/24 1807 1,000 mL at 06/02/24 1807   feeding supplement (PROSource TF20) liquid 60 mL  60 mL Per Tube BID Sethi, Pramod S, MD   60 mL at 06/02/24 2134   insulin  aspart (novoLOG ) injection 0-9 Units  0-9 Units Subcutaneous Q4H [place], PA-C   3 Units at 06/03/24 0413   ipratropium-albuterol  (DUONEB) 0.5-2.5 (3) MG/3ML nebulizer solution 3 mL  3 mL Nebulization Q4H PRN Lezlie Reddish, DO       nicotine  (NICODERM CQ  - dosed in mg/24 hours) patch 14 mg  14 mg Transdermal Daily Shelli Dexter M, PA-C   14 mg at 06/02/24 4696   Oral care mouth rinse  15 mL Mouth Rinse PRN Babcock, Peter E, NP       oxidized cellulose (Surgicel) pad 1 each  1 each Topical Q4H PRN Paliwal, Aditya, MD       pantoprazole  (PROTONIX ) injection 40 mg  40 mg Intravenous QHS Stack, Colleen M, MD   40 mg at 06/02/24 2134   polyethylene glycol (MIRALAX  / GLYCOLAX ) packet 17 g  17 g Per Tube Daily PRN Babcock, Peter E, NP       revefenacin  (YUPELRI ) nebulizer solution 175 mcg  175  mcg Nebulization Daily Babcock, Peter E, NP   175 mcg at 06/03/24 0831   ticagrelor  (BRILINTA ) tablet 90 mg  90 mg Oral BID Deveshwar, Sanjeev, MD   90 mg at 06/02/24 2135   Or   ticagrelor  (BRILINTA ) tablet 90 mg  90 mg Per Tube BID Deveshwar, Sanjeev, MD   90 mg at 06/01/24 1049  Discharge Medications: Please see discharge summary for a list of discharge medications.  Relevant Imaging Results:  Relevant Lab Results:   Additional Information SSN: [id number] [place], [place]

## 2024-06-03 NOTE — Progress Notes (Signed)
 Patient is known to Dr. Alvira Josephs since September 2023, she underwent diagnostic angio on 09/06/22 and atherosclerotic ulcerated stenotic lesion of the left internal carotid artery caval cavernous segment associated with a superior hypophyseal region aneurysm with placement of one 4.25 mm x 16 mm pipeline Shield flow diverter device on 09/08/22.   She was brought to ED as code stroke pm 6/2, found to have occluded left internal carotid artery at the level of the previously positioned flow diverter stent. [facility] thrombectomy was performed by Dr. Alvira Josephs on 05/27/24,  achieving a TICI  3 revascularization.   Recommend DAPT with ASA 81 mg every day and Brilinta  90 mg BID and follow up imaging with MRA Brain in 6 months.  MRA ordered.  Please call IR for questions and concerns.   Aryiana Klinkner H Kamaya Keckler PA-C 06/03/2024 10:29 AM

## 2024-06-03 NOTE — Progress Notes (Signed)
 Physical Therapy Treatment Patient Details Name: Rita [name] MRN: [id number] DOB: 27-Feb-1966 Today's Date: 06/03/2024   History of Present Illness Rita [name] is a 58 yo female presenting to [facility] with R sided weakness and aphasia. CTA showed occlusion of the L ICA, L vertebral artery and L subclavian artery occlusions. TNK administered and transferred to [facility] for thrombectomy. Remained intubated after the procedure, 6/2-6/4. MRI shows large L MCA ischemic infarct and nonhemorrhagic L lentiform nucleus and periventricular white matter infarcts. PMH includes HTN, HLD, prior CVA s/p L carotid subclavian bypass graft, COPD, tobacco use, GERD with Barrett's esophagus, chronic pancreatitis, chronic opioid use, depression.    PT Comments  Pt tolerated treatment well today. Pt able to perform DGI scoring 15/24 indicating that she is a high fall risk. DC recs updated to inpatient follow up therapy, <3 hours/day per family decision. PT will continue to follow.      If plan is discharge home, recommend the following: A lot of help with bathing/dressing/bathroom;Assistance with cooking/housework;Assist for transportation;Help with stairs or ramp for entrance;Supervision due to cognitive status   Can travel by private vehicle     Yes  Equipment Recommendations  None recommended by PT    Recommendations for Other Services       Precautions / Restrictions Precautions Precautions: Fall;Other (comment) Recall of Precautions/Restrictions: Impaired Precaution/Restrictions Comments: cortrack Restrictions Weight Bearing Restrictions Per Provider Order: No     Mobility  Bed Mobility               General bed mobility comments: Seated EOB with nursing staff    Transfers Overall transfer level: Needs assistance Equipment used: None Transfers: Sit to/from Stand Sit to Stand: Contact guard assist           General transfer comment: Good power up to standing from edge of bed, CGA for  safety    Ambulation/Gait Ambulation/Gait assistance: Contact guard assist, Min assist Gait Distance (Feet): 250 Feet Assistive device: None Gait Pattern/deviations: Step-through pattern, Decreased stride length Gait velocity: decreased     General Gait Details: Pt able to perform DGI. Noted to keep RUE tucked in. Occasional Min A for obstacle navigation. Required some visual demonstration for DGI tasks.   Stairs Stairs: Yes Stairs assistance: Contact guard assist Stair Management: Two rails, Alternating pattern, Forwards Number of Stairs: 2 General stair comments: Performed as part of DGI   Wheelchair Mobility     Tilt Bed    Modified Rankin (Stroke Patients Only) Modified Rankin (Stroke Patients Only) Pre-Morbid Rankin Score: No symptoms Modified Rankin: Moderately severe disability     Balance Overall balance assessment: Needs assistance Sitting-balance support: Feet supported Sitting balance-Leahy Scale: Good     Standing balance support: No upper extremity supported, During functional activity Standing balance-Leahy Scale: Good                   Standardized Balance Assessment Standardized Balance Assessment : Dynamic Gait Index   Dynamic Gait Index Level Surface: Mild Impairment Change in Gait Speed: Mild Impairment Gait with Horizontal Head Turns: Moderate Impairment Gait with Vertical Head Turns: Mild Impairment Gait and Pivot Turn: Normal Step Over Obstacle: Moderate Impairment Step Around Obstacles: Moderate Impairment Steps: Normal Total Score: 15      Communication Communication Communication: Impaired Factors Affecting Communication: Difficulty expressing self  Cognition Arousal: Alert Behavior During Therapy: Flat affect   PT - Cognitive impairments: Difficult to assess Difficult to assess due to: Impaired communication  PT - Cognition Comments: 75% accurate with yes/no questions, no verbalizations,  follows gestural commands well Following commands: Impaired Following commands impaired: Follows one step commands inconsistently    Cueing Cueing Techniques: Verbal cues, Gestural cues, Tactile cues, Visual cues  Exercises      General Comments General comments (skin integrity, edema, etc.): VSS      Pertinent Vitals/Pain Pain Assessment Pain Assessment: No/denies pain    Home Living                          Prior Function            PT Goals (current goals can now be found in the care plan section) Progress towards PT goals: Progressing toward goals    Frequency    Min 3X/week      PT Plan      Co-evaluation              AM-PAC PT "6 Clicks" Mobility   Outcome Measure  Help needed turning from your back to your side while in a flat bed without using bedrails?: A Little Help needed moving from lying on your back to sitting on the side of a flat bed without using bedrails?: A Little Help needed moving to and from a bed to a chair (including a wheelchair)?: A Little Help needed standing up from a chair using your arms (e.g., wheelchair or bedside chair)?: A Little Help needed to walk in hospital room?: A Little Help needed climbing 3-5 steps with a railing? : A Lot 6 Click Score: 17    End of Session Equipment Utilized During Treatment: Gait belt Activity Tolerance: Patient tolerated treatment well Patient left: with call bell/phone within reach;in chair;with chair alarm set Nurse Communication: Mobility status PT Visit Diagnosis: Unsteadiness on feet (R26.81);Hemiplegia and hemiparesis Hemiplegia - Right/Left: Right Hemiplegia - dominant/non-dominant: Dominant Hemiplegia - caused by: Cerebral infarction     Time: 1610-9604 PT Time Calculation (min) (ACUTE ONLY): 9 min  Charges:    $Gait Training: 8-22 mins PT General Charges $$ ACUTE PT VISIT: 1 Visit                     Rodgers Clack, PT, DPT Acute Rehab Services [phone]    Lazer Wollard 06/03/2024, 10:57 AM

## 2024-06-03 NOTE — Progress Notes (Signed)
 STROKE TEAM PROGRESS NOTE   SIGNIFICANT HOSPITAL EVENTS 6/2-patient presented to [facility] with right-sided weakness and aphasia, TNK was administered and patient was transferred here for thrombectomy. TICI 3 revascularization of distal left ICA stent occlusion and MCA achieved, remained intubated after procedure 6/4- Extubated  6/6- transferred out of the ICU  INTERIM HISTORY/SUBJECTIVE No family at bedside.  Patient is trying to get up to bathroom, I had nurse tech came in to assist her. Still has aphasia. Pending SNF  OBJECTIVE  CBC    Component Value Date/Time   WBC 12.0 (H) 06/01/2024 1004   RBC 3.98 06/01/2024 1004   HGB 11.8 (L) 06/01/2024 1004   HCT 36.5 06/01/2024 1004   PLT 396 06/01/2024 1004   MCV 91.7 06/01/2024 1004   MCH 29.6 06/01/2024 1004   MCHC 32.3 06/01/2024 1004   RDW 14.1 06/01/2024 1004   LYMPHSABS 0.9 05/28/2024 0548   MONOABS 0.6 05/28/2024 0548   EOSABS 0.0 05/28/2024 0548   BASOSABS 0.0 05/28/2024 0548    BMET    Component Value Date/Time   NA 139 06/01/2024 1004   K 4.0 06/01/2024 1004   CL 103 06/01/2024 1004   CO2 29 06/01/2024 1004   GLUCOSE 147 (H) 06/01/2024 1004   BUN 21 (H) 06/01/2024 1004   CREATININE 0.78 06/01/2024 1004   CREATININE 0.94 02/08/2018 1209   CALCIUM  9.3 06/01/2024 1004   GFRNONAA >60 06/01/2024 1004    IMAGING past 24 hours No results found.   Vitals:   06/03/24 0806 06/03/24 0831 06/03/24 1148 06/03/24 1553  BP: 129/70  121/72 104/69  Pulse: 63  (!) 58 66  Resp: 16  14 16   Temp: 98.5 F (36.9 C)  98.3 F (36.8 C) 98.4 F (36.9 C)  TempSrc: Oral  Oral Oral  SpO2: 98% 98% 100% 97%  Weight:         PHYSICAL EXAM General:  Alert, well-nourished, well-developed patient in no acute distress Psych:  Mood and affect appropriate for situation CV: Regular rate and rhythm on monitor Respiratory: Respirations regular and unlabored on room air  Neuro: Pt is awake, alert, eyes open, global aphasia  but able to have sound out but not meaningful, able to follow commands of eye open/close but not other commands but able to pantomime some commands. Not able to name and repeat and read. No gaze palsy, tracking bilaterally, blinking to visual threat on the left but not to the right.  Mild right facial droop. Tongue protrusion not corporative. LUE 5/5 and RUE 4+/5 with ataxic movement, BLE 5/5. Sensation, coordination not corporative and gait not tested.    ASSESSMENT/PLAN  Ms. JAXIE RACANELLI is a 58 y.o. female with history of smoking, left carotid subclavian bypass graft, right lower lung mass, COPD, chronic pancreatitis, depression, GERD, hyperlipidemia, hypertension and previous stroke admitted for acute onset right-sided weakness and aphasia.  Patient was given TNK at [facility] and was transferred here for thrombectomy.  Thrombectomy was successful with revascularization of left ICA and left M1 segment achieved with 3 passes.  NIH on Admission 18  Stroke: Large left MCA territory infarcts due to left distal ICA stent and MCA occlusion s/p TNK and IR with TICI3 reperfusion Etiology: Large vessel disease Code Stroke CT head No acute abnormality. ASPECTS 10.    CTA head & neck occlusion of the left ICA from proximal cervical segment to ICA terminus, left common carotid to left subclavian bypass noted, moderate stenosis of right supraclinoid  ICA, focal moderate stenosis of proximal M2 branch of left MCA, moderate stenosis of A2 segment of left ACA IR with occluded left ICA at the level of the previously positioned siphon flow diverter stent.  Status post IR with TICI3 MRI - Large acute nonhemorrhagic left MCA territory ischemic infarct. Acute nonhemorrhagic infarct in the posterior left lentiform nucleus. Acute nonhemorrhagic left periventricular white matter infarcts. Remote cortical infarcts in the left parietal lobe and along the watershed distribution over the left hemisphere. Remote  inferior cerebellar infarcts, right greater than left. Moderate mucosal disease in the sphenoid sinus bilaterally and posterior maxillary sinuses. Mild mucosal thickening in the anterior ethmoid air cells and inferior frontal sinuses. 2D Echo EF 65 to 70%, grade 1 diastolic dysfunction, no interatrial shunt, normal left atrial size LDL 97 HgbA1c 6.3 UDS positive for cocaine and THC VTE prophylaxis -Lovenox  aspirin  81 mg daily and Brilinta  (ticagrelor ) 90 mg bid prior to admission, now on back on aspirin  and Brilinta   Therapy recommendations:  CIR Disposition: Pending  Hx of Stroke/TIA 12/2018 admitted for left occipital and MCA/ACA infarct.  MRA head and neck showed left VA occlusion, right M1 moderate stenosis, left A1 moderate stenosis.  EF 60 to 65%.  No DVT.  LDL 83, A1c 5.7, UDS positive for THC.  Discharged on DAPT and Lipitor  80 08/2022 admitted for left frontal and parietal watershed infarcts.  CTA head and neck left VA chronic occlusion, right V3 occlusion.  Moderate to severe left siphon and moderate right siphon stenosis.  Status post left ICA stent stenting.  Hypertension Home meds: None Stable now Monitor BP goal normotensive  Hyperlipidemia Home meds: Atorvastatin  80 mg daily and ezetimibe  10 mg daily LDL- 97, goal less than 70 Now home Lipitor  80 and Zetia  10 Continue statin and Zetia  at discharge  Tobacco Abuse Patient smokes 0.5 packs per day for 41 years Will assess readiness to quit when patient aphasia improves  Substance Abuse UDS positive for  THC and cocaine Will assess readiness to quit when patient aphasia improves TOC consult for cessation placed  Dysphagia Patient has post-stroke dysphagia, SLP consulted  now on dysphagia 1 nectar thick liquid  Cortrak in place On nocturnal tube feeding Caloric count in progress Advance diet as tolerated  Other Stroke Risk Factors Obesity, Body mass index is 33.15 kg/m., BMI >/= 30 associated with increased stroke  risk, recommend weight loss, diet and exercise as appropriate  PAD, history of left CCA to subclavian artery bypass in 2019  Other Active Problems Hypokalemia, 2.9 -> 3.2 -> 4.0 -> pending  History of right lower lobe mass  Hospital day # 7  Consuelo Denmark, MD PhD Stroke Neurology 06/03/2024 4:59 PM     To contact Stroke Continuity provider, please refer to [email or web address]. After hours, contact General Neurology

## 2024-06-04 DIAGNOSIS — F1410 Cocaine abuse, uncomplicated: Secondary | ICD-10-CM | POA: Diagnosis not present

## 2024-06-04 DIAGNOSIS — I63512 Cerebral infarction due to unspecified occlusion or stenosis of left middle cerebral artery: Secondary | ICD-10-CM | POA: Diagnosis not present

## 2024-06-04 DIAGNOSIS — I69391 Dysphagia following cerebral infarction: Secondary | ICD-10-CM | POA: Diagnosis not present

## 2024-06-04 DIAGNOSIS — F17210 Nicotine dependence, cigarettes, uncomplicated: Secondary | ICD-10-CM | POA: Diagnosis not present

## 2024-06-04 LAB — CBC
HCT: 33.8 % — ABNORMAL LOW (ref 36.0–46.0)
Hemoglobin: 11.1 g/dL — ABNORMAL LOW (ref 12.0–15.0)
MCH: 30.1 pg (ref 26.0–34.0)
MCHC: 32.8 g/dL (ref 30.0–36.0)
MCV: 91.6 fL (ref 80.0–100.0)
Platelets: 380 10*3/uL (ref 150–400)
RBC: 3.69 MIL/uL — ABNORMAL LOW (ref 3.87–5.11)
RDW: 13.9 % (ref 11.5–15.5)
WBC: 11.7 10*3/uL — ABNORMAL HIGH (ref 4.0–10.5)
nRBC: 0 % (ref 0.0–0.2)

## 2024-06-04 LAB — BASIC METABOLIC PANEL WITH GFR
Anion gap: 9 (ref 5–15)
BUN: 25 mg/dL — ABNORMAL HIGH (ref 6–20)
CO2: 24 mmol/L (ref 22–32)
Calcium: 8.9 mg/dL (ref 8.9–10.3)
Chloride: 103 mmol/L (ref 98–111)
Creatinine, Ser: 0.76 mg/dL (ref 0.44–1.00)
GFR, Estimated: >60 mL/min (ref >60)
Glucose, Bld: 218 mg/dL — ABNORMAL HIGH (ref 70–99)
Potassium: 3.9 mmol/L (ref 3.5–5.1)
Sodium: 136 mmol/L (ref 135–145)

## 2024-06-04 LAB — GLUCOSE, CAPILLARY
Glucose-Capillary: 166 mg/dL — ABNORMAL HIGH (ref 70–99)
Glucose-Capillary: 197 mg/dL — ABNORMAL HIGH (ref 70–99)
Glucose-Capillary: 149 mg/dL — ABNORMAL HIGH (ref 70–99)
Glucose-Capillary: 155 mg/dL — ABNORMAL HIGH (ref 70–99)
Glucose-Capillary: 164 mg/dL — ABNORMAL HIGH (ref 70–99)
Glucose-Capillary: 183 mg/dL — ABNORMAL HIGH (ref 70–99)
Glucose-Capillary: 138 mg/dL — ABNORMAL HIGH (ref 70–99)

## 2024-06-04 MED ORDER — OSMOLITE 1.5 CAL PO LIQD: 1000.0000 mL | ORAL | Status: DC

## 2024-06-04 MED ORDER — PANTOPRAZOLE SODIUM 40 MG PO TBEC
40.0000 mg | DELAYED_RELEASE_TABLET | Freq: Every day | ORAL | Status: DC
  Administered 2024-06-04 – 2024-06-05 (×2): 40 mg via ORAL
  Filled 2024-06-04 (×2): qty 1

## 2024-06-04 MED ORDER — ATORVASTATIN CALCIUM 80 MG PO TABS
80.0000 mg | ORAL_TABLET | Freq: Every day | ORAL | Status: DC
  Administered 2024-06-04 – 2024-06-05 (×2): 80 mg via ORAL
  Filled 2024-06-04 (×2): qty 1

## 2024-06-04 MED ORDER — POLYETHYLENE GLYCOL 3350 17 G PO PACK: 17.0000 g | PACK | Freq: Every day | ORAL | Status: DC | PRN

## 2024-06-04 MED ORDER — EZETIMIBE 10 MG PO TABS
10.0000 mg | ORAL_TABLET | Freq: Every day | ORAL | Status: DC
  Administered 2024-06-05: 10 mg via ORAL
  Filled 2024-06-04: qty 1

## 2024-06-04 MED ORDER — AMLODIPINE BESYLATE 5 MG PO TABS
5.0000 mg | ORAL_TABLET | Freq: Every day | ORAL | Status: DC
  Administered 2024-06-05: 5 mg via ORAL
  Filled 2024-06-04: qty 1

## 2024-06-04 MED ORDER — DOCUSATE SODIUM 100 MG PO CAPS: 100.0000 mg | ORAL_CAPSULE | Freq: Two times a day (BID) | ORAL | Status: DC | PRN

## 2024-06-04 NOTE — Progress Notes (Signed)
 Calorie Count Note  48 hour calorie count ordered.  Pt resting in bed at the time of assessment. Significant other at bedside. Pt eating some, ~65% average intake of energy needs. Pt thinks she would eat more if diet was upgraded. Agreeable to mighty shakes on trays. Will continue to monitor.  Diet: DYS 1, nectar Supplements: none  Estimated Nutritional Needs:  Kcal:  1700-1900 Protein:  90-105 grams Fluid:  >/= 1.9 L  6/9 Breakfast: 300 kcal, 14g protein 6/9 Lunch: 448 kcal 37g protein 6/10 Breakfast: 242 kcal, 11g protein  Total intake: 990 kcal (58% of minimum estimated needs)  62 protein (69% of minimum estimated needs)  NUTRITION DIAGNOSIS:  Inadequate oral intake related to acute illness as evidenced by meal completion < 50%. - Ongoing.    GOAL:  Patient will meet greater than or equal to 90% of their needs - Progressing  INTERVENTION:  Nocturnal tube feeds via cortrak tube:  Osmolite 1.5 @ 75 mL/hr  x 10 hours (2000 - 0600)  Regimen at goal provides 1125 kcal, 47 gm protein, and 572 mL free water  daily.  Encourage PO intake, monitor intake and further adjust TF as appropriate. Mighty Shake TID with meals, each supplement provides 330 kcals and 9 grams of protein    Edwena Graham, RD, LDN Registered Dietitian II Please reach out via secure chat

## 2024-06-04 NOTE — Progress Notes (Signed)
 STROKE TEAM PROGRESS NOTE   SIGNIFICANT HOSPITAL EVENTS 6/2-patient presented to [facility] with right-sided weakness and aphasia, TNK was administered and patient was transferred here for thrombectomy. TICI 3 revascularization of distal left ICA stent occlusion and MCA achieved, remained intubated after procedure 6/4- Extubated  6/6- transferred out of the ICU  INTERIM HISTORY/SUBJECTIVE No family at bedside.  Patient eating well apparently, will DC core track.  Pending SNF placement.  OBJECTIVE  CBC    Component Value Date/Time   WBC 11.7 (H) 06/04/2024 0437   RBC 3.69 (L) 06/04/2024 0437   HGB 11.1 (L) 06/04/2024 0437   HCT 33.8 (L) 06/04/2024 0437   PLT 380 06/04/2024 0437   MCV 91.6 06/04/2024 0437   MCH 30.1 06/04/2024 0437   MCHC 32.8 06/04/2024 0437   RDW 13.9 06/04/2024 0437   LYMPHSABS 0.9 05/28/2024 0548   MONOABS 0.6 05/28/2024 0548   EOSABS 0.0 05/28/2024 0548   BASOSABS 0.0 05/28/2024 0548    BMET    Component Value Date/Time   NA 136 06/04/2024 0437   K 3.9 06/04/2024 0437   CL 103 06/04/2024 0437   CO2 24 06/04/2024 0437   GLUCOSE 218 (H) 06/04/2024 0437   BUN 25 (H) 06/04/2024 0437   CREATININE 0.76 06/04/2024 0437   CREATININE 0.94 02/08/2018 1209   CALCIUM  8.9 06/04/2024 0437   GFRNONAA >60 06/04/2024 0437    IMAGING past 24 hours No results found.   Vitals:   06/04/24 0731 06/04/24 0912 06/04/24 1159 06/04/24 1612  BP: (!) 101/54 136/79 98/64 132/64  Pulse: 64 75 72 72  Resp: 19 20 19 19   Temp: 97.7 F (36.5 C)  97.6 F (36.4 C) 98.3 F (36.8 C)  TempSrc: Oral  Oral Oral  SpO2: 98% 100% 97% 99%  Weight:         PHYSICAL EXAM General:  Alert, well-nourished, well-developed patient in no acute distress Psych:  Mood and affect appropriate for situation CV: Regular rate and rhythm on monitor Respiratory: Respirations regular and unlabored on room air  Neuro: Pt is awake, alert, eyes open, global aphasia but able to have  sound out but not meaningful, able to follow commands of eye open/close but not other commands but able to pantomime some commands. Not able to name and repeat and read. No gaze palsy, tracking bilaterally, blinking to visual threat on the left but not to the right.  Mild right facial droop. Tongue protrusion not corporative. LUE 5/5 and RUE 4+/5 with ataxic movement, BLE 5/5. Sensation, coordination not corporative and gait not tested.    ASSESSMENT/PLAN  Rita [name] is a 58 y.o. female with history of smoking, left carotid subclavian bypass graft, right lower lung mass, COPD, chronic pancreatitis, depression, GERD, hyperlipidemia, hypertension and previous stroke admitted for acute onset right-sided weakness and aphasia.  Patient was given TNK at [facility] and was transferred here for thrombectomy.  Thrombectomy was successful with revascularization of left ICA and left M1 segment achieved with 3 passes.  NIH on Admission 18  Stroke: Large left MCA territory infarcts due to left distal ICA stent and MCA occlusion s/p TNK and IR with TICI3 reperfusion Etiology: Large vessel disease Code Stroke CT head No acute abnormality. ASPECTS 10.    CTA head & neck occlusion of the left ICA from proximal cervical segment to ICA terminus, left common carotid to left subclavian bypass noted, moderate stenosis of right supraclinoid ICA, focal moderate stenosis of proximal M2 branch  of left MCA, moderate stenosis of A2 segment of left ACA IR with occluded left ICA at the level of the previously positioned siphon flow diverter stent.  Status post IR with TICI3 MRI - Large acute nonhemorrhagic left MCA territory ischemic infarct. Acute nonhemorrhagic infarct in the posterior left lentiform nucleus. Acute nonhemorrhagic left periventricular white matter infarcts. Remote cortical infarcts in the left parietal lobe and along the watershed distribution over the left hemisphere. Remote inferior cerebellar  infarcts, right greater than left. Moderate mucosal disease in the sphenoid sinus bilaterally and posterior maxillary sinuses. Mild mucosal thickening in the anterior ethmoid air cells and inferior frontal sinuses. 2D Echo EF 65 to 70%, grade 1 diastolic dysfunction, no interatrial shunt, normal left atrial size LDL 97 HgbA1c 6.3 UDS positive for cocaine and THC VTE prophylaxis -Lovenox  aspirin  81 mg daily and Brilinta  (ticagrelor ) 90 mg bid prior to admission, now on back on aspirin  and Brilinta   Therapy recommendations:  CIR Disposition: Pending  Hx of Stroke/TIA 12/2018 admitted for left occipital and MCA/ACA infarct.  MRA head and neck showed left VA occlusion, right M1 moderate stenosis, left A1 moderate stenosis.  EF 60 to 65%.  No DVT.  LDL 83, A1c 5.7, UDS positive for THC.  Discharged on DAPT and Lipitor  80 08/2022 admitted for left frontal and parietal watershed infarcts.  CTA head and neck left VA chronic occlusion, right V3 occlusion.  Moderate to severe left siphon and moderate right siphon stenosis.  Status post left ICA stent stenting.  Hypertension Home meds: None Stable now Monitor BP goal normotensive  Hyperlipidemia Home meds: Atorvastatin  80 mg daily and ezetimibe  10 mg daily LDL- 97, goal less than 70 Now home Lipitor  80 and Zetia  10 Continue statin and Zetia  at discharge  Tobacco Abuse Patient smokes 0.5 packs per day for 41 years Will assess readiness to quit when patient aphasia improves  Substance Abuse UDS positive for  THC and cocaine Will assess readiness to quit when patient aphasia improves TOC consult for cessation placed  Dysphagia Patient has post-stroke dysphagia, SLP consulted  now on dysphagia 1 nectar thick liquid  Eating well, DC [place] diet as tolerated  Other Stroke Risk Factors Obesity, Body mass index is 34.18 kg/m., BMI >/= 30 associated with increased stroke risk, recommend weight loss, diet and exercise as appropriate  PAD,  history of left CCA to subclavian artery bypass in 2019  Other Active Problems Hypokalemia, 2.9 -> 3.2 -> 4.0 -> 3.9 History of right lower lobe mass  Hospital day # 8  Consuelo Denmark, MD PhD Stroke Neurology 06/04/2024 6:02 PM     To contact Stroke Continuity provider, please refer to [email or web address]. After hours, contact General Neurology

## 2024-06-04 NOTE — TOC Progression Note (Addendum)
 Transition of Care [facility]) - Progression Note    Patient Details  Name: Rita [name] MRN: [id number] Date of Birth: 04-Jun-1966  Transition of Care [facility]) CM/SW Contact  Tandy Fam, [place] Phone Number: 06/04/2024, 11:12 AM  Clinical Narrative:   CSW met with patient, daughter, and significant other at bedside to provide bed offers for SNF and answer questions. Family chose [facility]. Family aware that the only barrier is patient's cortrak and eating, once nutrition is adequate then patient can be discharged. CSW to follow to initiate insurance authorization once cortrak is removed.   UPDATE: CSW updated by medical team that patient is making progress with nutrition, cortrak can be removed today. CSW confirmed bed availability with [facility], requested CMA to initiate insurance authorization. CSW to follow.    Expected Discharge Plan: Skilled Nursing Facility Barriers to Discharge: Continued Medical Work up, English as a second language teacher, SNF Pending bed offer (substance use)  Expected Discharge Plan and Services In-house Referral: Clinical Social Work Discharge Planning Services: [employer] Consult Post Acute Care Choice: Skilled Nursing Facility Living arrangements for the past 2 months: Single Family Home                                       Social Determinants of Health (SDOH) Interventions SDOH Screenings   Food Insecurity: Patient Unable To Answer (05/28/2024)  Housing: Patient Unable To Answer (05/28/2024)  Transportation Needs: Patient Unable To Answer (05/28/2024)  Utilities: Patient Unable To Answer (05/28/2024)  Tobacco Use: High Risk (05/27/2024)    Readmission Risk Interventions     No data to display

## 2024-06-04 NOTE — Plan of Care (Signed)
  Problem: Health Behavior/Discharge Planning: Goal: Ability to manage health-related needs will improve Outcome: Progressing   Problem: Clinical Measurements: Goal: Will remain free from infection Outcome: Progressing   Problem: Activity: Goal: Risk for activity intolerance will decrease Outcome: Progressing   Problem: Nutrition: Goal: Adequate nutrition will be maintained Outcome: Progressing   Problem: Coping: Goal: Level of anxiety will decrease Outcome: Progressing   Problem: Safety: Goal: Ability to remain free from injury will improve Outcome: Progressing   Problem: Skin Integrity: Goal: Risk for impaired skin integrity will decrease Outcome: Progressing   Problem: Ischemic Stroke/TIA Tissue Perfusion: Goal: Complications of ischemic stroke/TIA will be minimized Outcome: Progressing

## 2024-06-04 NOTE — Progress Notes (Signed)
 Speech Language Pathology Treatment: Cognitive-Linguistic;Dysphagia  Patient Details Name: Rita [name] MRN: [id number] DOB: [birth year] Today's Date: 06/04/2024 Time: 4098-1191 SLP Time Calculation (min) (ACUTE ONLY): 20 min  Assessment / Plan / Recommendation Clinical Impression  Patient seen by SLP for skilled treatment focused on dysphagia goals and aphasia goals. When SLP entered room, patient with lunch tray in front of her, having eaten approximately 75% of food and drink items. She was still in process of feeding self some pudding and drinking nectar thick juice. Intermittent, delayed cough observed which was not productive. No significant amount of oral residuals observed s/p initial swallows.  Patient continues with severe expressive aphasia and mild-moderate receptive aphasia. She was able to point to objects in field of two when function of object named as well as object named, with 75% accuracy.  She pointed to body parts on face as well as to ceiling, floor, window with SLP providing modeling and visual cues. Patient did not appear labile as she had when this SLP saw her last week. She did seem to exhibit understanding when SLP telling her that MD plans to pull Cortrak today, by nodding and vocalizing in approval. Verbal expression is not reliable and so recommendation is to focus on non-verbal communication to have patient's basic wants/needs met and SLP will continue to follow.    HPI HPI: Rita [name] is a 58 yo female presenting to [facility] with R sided weakness and aphasia. CTA showed occlusion of the L ICA, L vertebral artery and L subclavian artery occlusions. TNK administered and transferred to [facility] for thrombectomy. Remained intubated after the procedure, 6/2-6/4. MRI shows large L MCA ischemic infarct and nonhemorrhagic L lentiform nucleus and periventricular white matter infarcts. PMH includes HTN, HLD, prior CVA s/p L carotid subclavian bypass graft, COPD, tobacco use, GERD with  Barrett's esophagus, chronic pancreatitis, chronic opioid use, depression      SLP Plan  Continue with current plan of care          Recommendations  Diet recommendations: Dysphagia 1 (puree);Nectar-thick liquid Liquids provided via: Cup;Straw Medication Administration: Crushed with puree Supervision: Patient able to self feed;Full supervision/cueing for compensatory strategies Compensations: Slow rate;Small sips/bites Postural Changes and/or Swallow Maneuvers: Seated upright 90 degrees;Upright 30-60 min after meal                  Oral care QID;Oral care prior to ice chip/H20;Staff/trained caregiver to provide oral care   Frequent or constant Supervision/Assistance Dysphagia, oropharyngeal phase (R13.12)     Continue with current plan of care     Jacqualine Mater, MA, CCC-SLP Speech Therapy

## 2024-06-04 NOTE — Plan of Care (Signed)
   Problem: Clinical Measurements: Goal: Diagnostic test results will improve Outcome: Progressing   Problem: Activity: Goal: Risk for activity intolerance will decrease Outcome: Progressing   Problem: Nutrition: Goal: Adequate nutrition will be maintained Outcome: Progressing

## 2024-06-04 NOTE — Progress Notes (Signed)
 Occupational Therapy Treatment Patient Details Name: Rita [name] MRN: [id number] DOB: August 27, 1966 Today's Date: 06/04/2024   History of present illness Rita [name] is a 58 yo female presenting to [facility] with R sided weakness and aphasia. CTA showed occlusion of the L ICA, L vertebral artery and L subclavian artery occlusions. TNK administered and transferred to [facility] for thrombectomy. Remained intubated after the procedure, 6/2-6/4. MRI shows large L MCA ischemic infarct and nonhemorrhagic L lentiform nucleus and periventricular white matter infarcts. PMH includes HTN, HLD, prior CVA s/p L carotid subclavian bypass graft, COPD, tobacco use, GERD with Barrett's esophagus, chronic pancreatitis, chronic opioid use, depression.   OT comments  Pt is making great progress towards their acute OT goals. On arrival pt's s/o was assisting with a meal, provided education and encouragement for pt to participate in functional tasks as independently as possible to aide in stroke recovery. Pt's family also assisted pt in a full bath prior to my arrival therefore session was focused on activity tolerance and cognitive tasks. Overall she required CGA for transfers and mobility without AD with minimal cues for safety. She followed 100% of 2 step commands and most 3 step commands with increased time and minimal assist. Pt became emotional at the end of the session when challenging BUE motor planning. OT to continue to follow acutely to facilitate progress towards established goals. Pt will continue to benefit from intensive inpatient follow up therapy, >3 hours/day after discharge.       If plan is discharge home, recommend the following:  Two people to help with walking and/or transfers;Two people to help with bathing/dressing/bathroom      Recommendations for Other Services Speech consult    Precautions / Restrictions Precautions Precautions: Fall;Other (comment) Recall of Precautions/Restrictions:  Impaired Precaution/Restrictions Comments: cortrack Restrictions Weight Bearing Restrictions Per Provider Order: No       Mobility Bed Mobility               General bed mobility comments: in recline on arrival    Transfers Overall transfer level: Needs assistance Equipment used: None Transfers: Sit to/from Stand Sit to Stand: Contact guard assist           General transfer comment: mild impulsivity     Balance Overall balance assessment: Needs assistance Sitting-balance support: Feet supported Sitting balance-Leahy Scale: Good     Standing balance support: No upper extremity supported, During functional activity Standing balance-Leahy Scale: Good                             ADL either performed or assessed with clinical judgement   ADL Overall ADL's : Needs assistance/impaired Eating/Feeding: Moderate assistance;Sitting Eating/Feeding Details (indicate cue type and reason): pt's husband feeding pt upon arrival, educated family on allowing pt to be as indep as possible in feeding task             Upper Body Dressing : Minimal assistance;Standing       Toilet Transfer: Contact guard assist;Ambulation Toilet Transfer Details (indicate cue type and reason): simulated. no AD, mild impulsivity noted         Functional mobility during ADLs: Contact guard assist General ADL Comments: pt's duaghter assisted pt with a shower prior to my arrival. pt conitnues to be limited by communication, motor planning and proprioception. MIld impulsivitiy noted however anticipate this may be baseline behavior    Extremity/Trunk Assessment Upper Extremity Assessment Upper Extremity Assessment: RUE deficits/detail  RUE Deficits / Details: continues to have weakness hower pt is able to use RUE during automatic tasks with visual cue. She has difficulty with sensory motor and perceptual tasks that are not functional/automatic RUE Sensation: decreased light  touch RUE Coordination: decreased fine motor;decreased gross motor   Lower Extremity Assessment Lower Extremity Assessment: Defer to PT evaluation        Vision   Additional Comments: appropriate scanning and locating items in a busy environment   Perception Perception Perception: Not tested   Praxis Praxis Praxis: Not tested   Communication Communication Communication: Impaired Factors Affecting Communication: Difficulty expressing self   Cognition Arousal: Alert Behavior During Therapy: Lability Cognition: Difficult to assess Difficult to assess due to: Impaired communication           OT - Cognition Comments: Pt follows 100% of 2 step commands and most 3 step commands with increased time and minimal cues. Pt's yes/no head nods seemed to be 100% accurate and appropriate. After cues, pt was able to sequence through automatic tasks but had diffuclt with showing thumbs up and reciprocal thumb to finger task bilaterally                 Following commands: Impaired Following commands impaired: Follows multi-step commands with increased time      Cueing   Cueing Techniques: Verbal cues, Gestural cues, Tactile cues, Visual cues        General Comments VSS    Pertinent Vitals/ Pain       Pain Assessment Pain Assessment: No/denies pain   Frequency  Min 2X/week        Progress Toward Goals  OT Goals(current goals can now be found in the care plan section)  Progress towards OT goals: Progressing toward goals  Acute Rehab OT Goals Patient Stated Goal: per s/o to eat more OT Goal Formulation: Patient unable to participate in goal setting Time For Goal Achievement: 06/12/24 Potential to Achieve Goals: Good ADL Goals Pt Will Perform Grooming: with mod assist;sitting Pt Will Perform Upper Body Bathing: with mod assist;sitting Pt Will Transfer to Toilet: with +2 assist;with min assist;stand pivot transfer;bedside commode Pt/caregiver will Perform Home  Exercise Program: Increased strength;Right Upper extremity;With minimal assist;With written HEP provided Additional ADL Goal #1: pt will complete bed mobility with min (A) precursor to adls   AM-PAC OT "6 Clicks" Daily Activity     Outcome Measure   Help from another person eating meals?: A Lot Help from another person taking care of personal grooming?: A Little Help from another person toileting, which includes using toliet, bedpan, or urinal?: A Little Help from another person bathing (including washing, rinsing, drying)?: A Lot Help from another person to put on and taking off regular upper body clothing?: A Little Help from another person to put on and taking off regular lower body clothing?: A Lot 6 Click Score: 15    End of Session Equipment Utilized During Treatment: Gait belt  OT Visit Diagnosis: Unsteadiness on feet (R26.81);Muscle weakness (generalized) (M62.81)   Activity Tolerance Patient tolerated treatment well   Patient Left in bed;with call bell/phone within reach;with bed alarm set   Nurse Communication Mobility status;Precautions        Time: 1610-9604 OT Time Calculation (min): 19 min  Charges: OT General Charges $OT Visit: 1 Visit OT Treatments $Therapeutic Activity: 8-22 mins  Veryl Gottron, OTR/L Acute Rehabilitation Services Office [phone] Secure Chat Communication Preferred   Starla Easterly 06/04/2024, 10:14 AM

## 2024-06-05 DIAGNOSIS — R29709 NIHSS score 9: Secondary | ICD-10-CM | POA: Diagnosis not present

## 2024-06-05 DIAGNOSIS — I779 Disorder of arteries and arterioles, unspecified: Secondary | ICD-10-CM | POA: Diagnosis not present

## 2024-06-05 DIAGNOSIS — I63512 Cerebral infarction due to unspecified occlusion or stenosis of left middle cerebral artery: Secondary | ICD-10-CM | POA: Diagnosis not present

## 2024-06-05 LAB — GLUCOSE, CAPILLARY
Glucose-Capillary: 149 mg/dL — ABNORMAL HIGH (ref 70–99)
Glucose-Capillary: 114 mg/dL — ABNORMAL HIGH (ref 70–99)
Glucose-Capillary: 162 mg/dL — ABNORMAL HIGH (ref 70–99)

## 2024-06-05 MED ORDER — ASPIRIN 81 MG PO CHEW: 81.0000 mg | CHEWABLE_TABLET | Freq: Every day | ORAL | Status: DC

## 2024-06-05 MED ORDER — IPRATROPIUM-ALBUTEROL 0.5-2.5 (3) MG/3ML IN SOLN
3.0000 mL | RESPIRATORY_TRACT | Status: DC | PRN
Start: 2024-06-05 — End: 2024-06-16

## 2024-06-05 MED ORDER — DOCUSATE SODIUM 100 MG PO CAPS
100.0000 mg | ORAL_CAPSULE | Freq: Two times a day (BID) | ORAL | Status: DC | PRN
Start: 2024-06-05 — End: 2024-06-16

## 2024-06-05 MED ORDER — NICOTINE 14 MG/24HR TD PT24
14.0000 mg | MEDICATED_PATCH | Freq: Every day | TRANSDERMAL | Status: DC
Start: 2024-06-06 — End: 2024-06-16

## 2024-06-05 MED ORDER — ARFORMOTEROL TARTRATE 15 MCG/2ML IN NEBU: 15.0000 ug | INHALATION_SOLUTION | Freq: Two times a day (BID) | RESPIRATORY_TRACT | Status: DC

## 2024-06-05 MED ORDER — POLYETHYLENE GLYCOL 3350 17 G PO PACK
17.0000 g | PACK | Freq: Every day | ORAL | Status: DC | PRN
Start: 2024-06-05 — End: 2024-06-16

## 2024-06-05 MED ORDER — PANTOPRAZOLE SODIUM 40 MG PO TBEC
40.0000 mg | DELAYED_RELEASE_TABLET | Freq: Every day | ORAL | Status: DC
Start: 2024-06-06 — End: 2024-06-16

## 2024-06-05 MED ORDER — TICAGRELOR 90 MG PO TABS
90.0000 mg | ORAL_TABLET | Freq: Two times a day (BID) | ORAL | Status: DC
Start: 2024-06-05 — End: 2024-06-16

## 2024-06-05 MED ORDER — INSULIN ASPART 100 UNIT/ML IJ SOLN: 0.0000 [IU] | INTRAMUSCULAR | Status: DC

## 2024-06-05 MED ORDER — AMLODIPINE BESYLATE 5 MG PO TABS
5.0000 mg | ORAL_TABLET | Freq: Every day | ORAL | Status: DC
Start: 2024-06-06 — End: 2024-06-16

## 2024-06-05 MED ORDER — BUDESONIDE 0.5 MG/2ML IN SUSP
0.5000 mg | Freq: Two times a day (BID) | RESPIRATORY_TRACT | Status: DC
Start: 2024-06-05 — End: 2024-06-16

## 2024-06-05 NOTE — Plan of Care (Signed)
  Problem: Health Behavior/Discharge Planning: Goal: Ability to manage health-related needs will improve Outcome: Progressing   Problem: Clinical Measurements: Goal: Will remain free from infection Outcome: Progressing   Problem: Activity: Goal: Risk for activity intolerance will decrease Outcome: Progressing   Problem: Nutrition: Goal: Adequate nutrition will be maintained Outcome: Progressing   Problem: Coping: Goal: Level of anxiety will decrease Outcome: Progressing   Problem: Safety: Goal: Ability to remain free from injury will improve Outcome: Progressing   Problem: Skin Integrity: Goal: Risk for impaired skin integrity will decrease Outcome: Progressing   Problem: Ischemic Stroke/TIA Tissue Perfusion: Goal: Complications of ischemic stroke/TIA will be minimized Outcome: Progressing

## 2024-06-05 NOTE — Progress Notes (Signed)
 Speech Language Pathology Treatment: Cognitive-Linguistic  Patient Details Name: Rita [name] MRN: [id number] DOB: 1966/06/02 Today's Date: 06/05/2024 Time: 7829-5621 SLP Time Calculation (min) (ACUTE ONLY): 15 min  Assessment / Plan / Recommendation Clinical Impression  Patient seen by SLP for skilled treatment focused on aphasia goals. Patient had belongings packed up and is a planned discharge to SNF for short term rehab. Session focused on use of communication board pictures for basic, immediate needs. She was able to point to object photo in field of two with 100% accuracy but when field increased to 4, accuracy was approximately 75%. Patient is often hesitant and seems unsure when making a choice. SLP demonstrated use of gestures to indicate things such as: hungry, tired, hurt, etc and patient able to imitate with minimal cues. SLP provided patient with communication boards and provided some reassurance that she has been improving and is expected to continue to make improvements.    HPI HPI: Rita [name] is a 58 yo female presenting to [facility] with R sided weakness and aphasia. CTA showed occlusion of the L ICA, L vertebral artery and L subclavian artery occlusions. TNK administered and transferred to [facility] for thrombectomy. Remained intubated after the procedure, 6/2-6/4. MRI shows large L MCA ischemic infarct and nonhemorrhagic L lentiform nucleus and periventricular white matter infarcts. PMH includes HTN, HLD, prior CVA s/p L carotid subclavian bypass graft, COPD, tobacco use, GERD with Barrett's esophagus, chronic pancreatitis, chronic opioid use, depression      SLP Plan  Continue with current plan of care          Recommendations   SLP at next venue of care                      Frequent or constant Supervision/Assistance Aphasia (R47.01)     Continue with current plan of care     Jacqualine Mater, MA, CCC-SLP Speech Therapy

## 2024-06-05 NOTE — Progress Notes (Signed)
 Physical Therapy Treatment Patient Details Name: Rita [name] MRN: [id number] DOB: [birth year] Today's Date: 06/05/2024   History of Present Illness Rita [name] is a 58 yo female presenting to [facility] with R sided weakness and aphasia. CTA showed occlusion of the L ICA, L vertebral artery and L subclavian artery occlusions. TNK administered and transferred to [facility] for thrombectomy. Remained intubated after the procedure, 6/2-6/4. MRI shows large L MCA ischemic infarct and nonhemorrhagic L lentiform nucleus and periventricular white matter infarcts. PMH includes HTN, HLD, prior CVA s/p L carotid subclavian bypass graft, COPD, tobacco use, GERD with Barrett's esophagus, chronic pancreatitis, chronic opioid use, depression.    PT Comments  Pt tolerated treatment well today. Pt able to perform DGI again with slight improvement from previous session however still is a high fall risk scoring 16/24. No change in DC/DME recs at this time. PT will continue to follow.     If plan is discharge home, recommend the following: A lot of help with bathing/dressing/bathroom;Assistance with cooking/housework;Assist for transportation;Help with stairs or ramp for entrance;Supervision due to cognitive status   Can travel by private vehicle     Yes  Equipment Recommendations  None recommended by PT    Recommendations for Other Services       Precautions / Restrictions Precautions Precautions: Fall Recall of Precautions/Restrictions: Impaired Restrictions Weight Bearing Restrictions Per Provider Order: No     Mobility  Bed Mobility Overal bed mobility: Needs Assistance Bed Mobility: Supine to Sit, Sit to Supine     Supine to sit: Supervision Sit to supine: Supervision        Transfers Overall transfer level: Needs assistance Equipment used: None Transfers: Sit to/from Stand Sit to Stand: Contact guard assist           General transfer comment: mild impulsivity     Ambulation/Gait Ambulation/Gait assistance: Contact guard assist Gait Distance (Feet): 300 Feet Assistive device: None Gait Pattern/deviations: Step-through pattern, Decreased stride length Gait velocity: decreased     General Gait Details: Pt able to perform DGI. Noted to keep RUE tucked in. Gestural cues for navigation. Required some visual demonstration for DGI tasks.   Stairs Stairs: Yes Stairs assistance: Contact guard assist Stair Management: Two rails, Alternating pattern, Forwards Number of Stairs: 2 General stair comments: Performed as part of DGI   Wheelchair Mobility     Tilt Bed    Modified Rankin (Stroke Patients Only) Modified Rankin (Stroke Patients Only) Pre-Morbid Rankin Score: No symptoms Modified Rankin: Moderately severe disability     Balance Overall balance assessment: Needs assistance Sitting-balance support: Feet supported Sitting balance-Leahy Scale: Good     Standing balance support: No upper extremity supported, During functional activity Standing balance-Leahy Scale: Good                   Standardized Balance Assessment Standardized Balance Assessment : Dynamic Gait Index   Dynamic Gait Index Level Surface: Mild Impairment Change in Gait Speed: Mild Impairment Gait with Horizontal Head Turns: Mild Impairment Gait with Vertical Head Turns: Mild Impairment Gait and Pivot Turn: Normal Step Over Obstacle: Moderate Impairment Step Around Obstacles: Moderate Impairment Steps: Normal Total Score: 16      Communication Communication Communication: Impaired Factors Affecting Communication: Difficulty expressing self  Cognition Arousal: Alert Behavior During Therapy: Impulsive   PT - Cognitive impairments: Difficult to assess Difficult to assess due to: Impaired communication  PT - Cognition Comments: 75% accurate with yes/no questions, no verbalizations, follows gestural commands  well Following commands: Impaired Following commands impaired: Follows multi-step commands with increased time    Cueing Cueing Techniques: Verbal cues, Gestural cues, Tactile cues, Visual cues  Exercises      General Comments General comments (skin integrity, edema, etc.): VSS      Pertinent Vitals/Pain Pain Assessment Pain Assessment: No/denies pain    Home Living                          Prior Function            PT Goals (current goals can now be found in the care plan section) Progress towards PT goals: Progressing toward goals    Frequency    Min 3X/week      PT Plan      Co-evaluation              AM-PAC PT 6 Clicks Mobility   Outcome Measure  Help needed turning from your back to your side while in a flat bed without using bedrails?: A Little Help needed moving from lying on your back to sitting on the side of a flat bed without using bedrails?: A Little Help needed moving to and from a bed to a chair (including a wheelchair)?: A Little Help needed standing up from a chair using your arms (e.g., wheelchair or bedside chair)?: A Little Help needed to walk in hospital room?: A Little Help needed climbing 3-5 steps with a railing? : A Lot 6 Click Score: 17    End of Session Equipment Utilized During Treatment: Gait belt Activity Tolerance: Patient tolerated treatment well Patient left: in bed;with call bell/phone within reach Nurse Communication: Mobility status PT Visit Diagnosis: Unsteadiness on feet (R26.81);Hemiplegia and hemiparesis Hemiplegia - Right/Left: Right Hemiplegia - dominant/non-dominant: Dominant Hemiplegia - caused by: Cerebral infarction     Time: 6213-0865 PT Time Calculation (min) (ACUTE ONLY): 13 min  Charges:    $Gait Training: 8-22 mins PT General Charges $$ ACUTE PT VISIT: 1 Visit                     Rodgers Clack, PT, DPT Acute Rehab Services [phone]    Kalianne Fetting 06/05/2024, 11:41 AM

## 2024-06-05 NOTE — Discharge Summary (Addendum)
 Stroke Discharge Summary  Patient ID: Rita [name]   MRN: [id number]      DOB: January 02, 1966  Date of Admission: 05/27/2024 Date of Discharge: 06/05/2024  Attending Physician:  Stroke, Md, MD, Stroke MD Patient's PCP:  [facility]  Discharge Diagnoses:  Stroke: Large left MCA territory infarcts due to left distal ICA stent and MCA occlusion s/p TNK and IR with TICI3 reperfusion Etiology: Large vessel disease  Secondary diagnosis History of stroke Hypertension Hyperlipidemia Smoker THC abuse Cocaine abuse Dysphagia Obesity PAD History of right lower lobe lung mass   Medications to be continued on Rehab Allergies as of 06/05/2024   No Known Allergies      Medication List     STOP taking these medications    albuterol  108 (90 Base) MCG/ACT inhaler Commonly known as: VENTOLIN  HFA   Aspirin  Low Dose 81 MG tablet Generic drug: aspirin  EC Replaced by: aspirin  81 MG chewable tablet   dexlansoprazole  60 MG capsule Commonly known as: Dexilant    ondansetron  4 MG tablet Commonly known as: ZOFRAN    potassium chloride  10 MEQ CR capsule Commonly known as: MICRO-K    promethazine  12.5 MG tablet Commonly known as: PHENERGAN    varenicline 0.5 MG tablet Commonly known as: CHANTIX       TAKE these medications    amLODipine  5 MG tablet Commonly known as: NORVASC  Take 1 tablet (5 mg total) by mouth [name]. Start taking on: June 06, 2024   arformoterol  15 MCG/2ML Nebu Commonly known as: BROVANA  Take 2 mLs (15 mcg total) by nebulization 2 (two) times [name].   aspirin  81 MG chewable tablet Chew 1 tablet (81 mg total) by mouth [name]. Start taking on: June 06, 2024 Replaces: Aspirin  Low Dose 81 MG tablet   atorvastatin  80 MG tablet Commonly known as: LIPITOR  Take 1 tablet (80 mg total) by mouth [name] at 6 PM. What changed: when to take this   budesonide  0.5 MG/2ML nebulizer solution Commonly known as: PULMICORT  Take 2 mLs (0.5 mg total) by  nebulization 2 (two) times [name].   Buprenorphine  HCl-Naloxone  HCl 8-2 MG Film Take 1 Film by mouth 3 (three) times [name].   docusate sodium  100 MG capsule Commonly known as: COLACE Take 1 capsule (100 mg total) by mouth 2 (two) times [name] as needed for mild constipation.   ezetimibe  10 MG tablet Commonly known as: ZETIA  Take 1 tablet (10 mg total) by mouth [name].   insulin  aspart 100 UNIT/ML injection Commonly known as: novoLOG  Inject 0-9 Units into the skin every 4 (four) hours.   ipratropium-albuterol  0.5-2.5 (3) MG/3ML Soln Commonly known as: DUONEB Take 3 mLs by nebulization every 4 (four) hours as needed.   nicotine  14 mg/24hr patch Commonly known as: NICODERM CQ  - dosed in mg/24 hours Place 1 patch (14 mg total) onto the skin [name]. Start taking on: June 06, 2024   pantoprazole  40 MG tablet Commonly known as: PROTONIX  Take 1 tablet (40 mg total) by mouth [name]. Start taking on: June 06, 2024   polyethylene glycol 17 g packet Commonly known as: MIRALAX  / GLYCOLAX  Take 17 g by mouth [name] as needed for moderate constipation.   ticagrelor  90 MG Tabs tablet Commonly known as: BRILINTA  Take 1 tablet (90 mg total) by mouth 2 (two) times [name]. What changed: when to take this        LABORATORY STUDIES CBC    Component Value Date/Time   WBC 11.7 (H) 06/04/2024 0437   RBC 3.69 (  L) 06/04/2024 0437   HGB 11.1 (L) 06/04/2024 0437   HCT 33.8 (L) 06/04/2024 0437   PLT 380 06/04/2024 0437   MCV 91.6 06/04/2024 0437   MCH 30.1 06/04/2024 0437   MCHC 32.8 06/04/2024 0437   RDW 13.9 06/04/2024 0437   LYMPHSABS 0.9 05/28/2024 0548   MONOABS 0.6 05/28/2024 0548   EOSABS 0.0 05/28/2024 0548   BASOSABS 0.0 05/28/2024 0548   CMP    Component Value Date/Time   NA 136 06/04/2024 0437   K 3.9 06/04/2024 0437   CL 103 06/04/2024 0437   CO2 24 06/04/2024 0437   GLUCOSE 218 (H) 06/04/2024 0437   BUN 25 (H) 06/04/2024 0437   CREATININE 0.76 06/04/2024 0437    CREATININE 0.94 02/08/2018 1209   CALCIUM  8.9 06/04/2024 0437   PROT 7.4 05/27/2024 1508   ALBUMIN 4.0 05/27/2024 1508   AST 21 05/27/2024 1508   ALT 20 05/27/2024 1508   ALKPHOS 73 05/27/2024 1508   BILITOT 0.2 05/27/2024 1508   GFRNONAA >60 06/04/2024 0437   GFRAA >60 01/24/2019 0515   COAGS Lab Results  Component Value Date   INR 0.9 05/27/2024   INR 0.9 03/31/2023   INR 0.9 09/08/2022   Lipid Panel    Component Value Date/Time   CHOL 220 (H) 05/28/2024 0548   TRIG 781 (H) 05/28/2024 0922   HDL 33 (L) 05/28/2024 0548   CHOLHDL 6.7 05/28/2024 0548   VLDL UNABLE TO CALCULATE IF TRIGLYCERIDE OVER 400 mg/dL 23/76/2831 5176   LDLCALC UNABLE TO CALCULATE IF TRIGLYCERIDE OVER 400 mg/dL 16/06/3709 6269   LDLCALC 171 (H) 01/17/2018 1153   HgbA1C  Lab Results  Component Value Date   HGBA1C 6.3 (H) 05/27/2024   Urinalysis    Component Value Date/Time   COLORURINE STRAW (A) 03/31/2023 1212   APPEARANCEUR CLEAR 03/31/2023 1212   LABSPEC 1.034 (H) 03/31/2023 1212   PHURINE 7.0 03/31/2023 1212   GLUCOSEU NEGATIVE 03/31/2023 1212   HGBUR NEGATIVE 03/31/2023 1212   BILIRUBINUR NEGATIVE 03/31/2023 1212   KETONESUR NEGATIVE 03/31/2023 1212   PROTEINUR NEGATIVE 03/31/2023 1212   UROBILINOGEN 0.2 02/05/2015 2347   NITRITE NEGATIVE 03/31/2023 1212   LEUKOCYTESUR NEGATIVE 03/31/2023 1212   Urine Drug Screen     Component Value Date/Time   LABOPIA NONE DETECTED 05/27/2024 2138   COCAINSCRNUR POSITIVE (A) 05/27/2024 2138   LABBENZ NONE DETECTED 05/27/2024 2138   AMPHETMU NONE DETECTED 05/27/2024 2138   THCU POSITIVE (A) 05/27/2024 2138   LABBARB NONE DETECTED 05/27/2024 2138    Alcohol  Level    Component Value Date/Time   [facility] <15 05/27/2024 1508     SIGNIFICANT DIAGNOSTIC STUDIES DG Swallowing Func-Speech Pathology Result Date: 05/31/2024 Table formatting from the original result was not included. Modified Barium Swallow Study Patient Details Name: Rita [name] MRN:  [id number] Date of Birth: 13-Nov-1966 Today's Date: 05/31/2024 HPI/PMH: HPI: Rita [name] is a 58 yo female presenting to [facility] with R sided weakness and aphasia. CTA showed occlusion of the L ICA, L vertebral artery and L subclavian artery occlusions. TNK administered and transferred to [facility] for thrombectomy. Remained intubated after the procedure, 6/2-6/4. MRI shows large L MCA ischemic infarct and nonhemorrhagic L lentiform nucleus and periventricular white matter infarcts. PMH includes HTN, HLD, prior CVA s/p L carotid subclavian bypass graft, COPD, tobacco use, GERD with Barrett's esophagus, chronic pancreatitis, chronic opioid use, depression Clinical Impression: Clinical Impression: Swallow study was limited by reduced visibility at the level of the vocal folds  and UES due to shadow from shoulder. Pt had difficulty following commands to adjust positioning and even when anatomy was more visible, she did not stay in that position long. Her oral phase was fairly appropriate with liquid and pureed boluses, with only trace residue along the tongue and base of tongue, and minimal interlabial escape, and slow lingual transit with purees. Incomplete velopharyngeal closure was observed with small amounts of boluses flowing upward between the soft palate and posterior pharyngeal wall, but then falling back down into her pharynx. Pharyngeally there is reduced movement of the hyoid, larynx, and epiglottis. Base of tongue retraction and pharyngeal squeeze are reduced. This impacts airway closure more than it does her efficiency. She has pretty consistent penetration across consistencies, with ejection of penetrates with purees (PAS 2) and a little frank penetration with nectar and honey (PAS 3), although with most of the barium clearing spontaneously. Pt had intermittent coughing with thin liquids that was concerning for aspiration but could not be visualized, but when able to see the vocal folds, there were instances when a  trace amount of thin liquids reached below them (PAS 7 or 8). Given current cognitive/communicative difficulties, she is not really appropriate for compensatory strategies at this time. Recommend that she advance to puree diet but with nectar (mildly) thick liquids. Factors that may increase risk of adverse event in presence of aspiration Roderick Civatte & Jessy Morocco 2021): Factors that may increase risk of adverse event in presence of aspiration Roderick Civatte & Jessy Morocco 2021): Reduced cognitive function; Dependence for feeding and/or oral hygiene Recommendations/Plan: Swallowing Evaluation Recommendations Swallowing Evaluation Recommendations Recommendations: PO diet PO Diet Recommendation: Dysphagia 1 (Pureed); Mildly thick liquids (Level 2, nectar thick) Liquid Administration via: Cup; Straw Medication Administration: Crushed with puree Supervision: Staff to assist with self-feeding; Full supervision/cueing for swallowing strategies Swallowing strategies  : Minimize environmental distractions; Slow rate; Small bites/sips; Check for pocketing or oral holding; Check for anterior loss Postural changes: Position pt fully upright for meals Oral care recommendations: Oral care BID (2x/day) Caregiver Recommendations: Avoid jello, ice cream, thin soups, popsicles; Remove water  pitcher Treatment Plan Treatment Plan Treatment recommendations: Therapy as outlined in treatment plan below Follow-up recommendations: Acute inpatient rehab (3 hours/day) Functional status assessment: Patient has had a recent decline in their functional status and demonstrates the ability to make significant improvements in function in a reasonable and predictable amount of time. Treatment frequency: Min 2x/week Treatment duration: 2 weeks Interventions: Aspiration precaution training; Compensatory techniques; Patient/family education; Trials of upgraded texture/liquids; Diet toleration management by SLP Recommendations Recommendations for follow up therapy are one  component of a multi-disciplinary discharge planning process, led by the attending physician.  Recommendations may be updated based on patient status, additional functional criteria and insurance authorization. Assessment: Orofacial Exam: Orofacial Exam Oral Cavity - Dentition: Edentulous Anatomy: No data recorded Boluses Administered: Boluses Administered Boluses Administered: Thin liquids (Level 0); Mildly thick liquids (Level 2, nectar thick); Moderately thick liquids (Level 3, honey thick); Puree  Oral Impairment Domain: Oral Impairment Domain Lip Closure: Interlabial escape, no progression to anterior lip Tongue control during bolus hold: Posterior escape of greater than half of bolus Bolus preparation/mastication: -- (deferred given minimal mastication observed at bedside) Bolus transport/lingual motion: Slow tongue motion Oral residue: Trace residue lining oral structures Location of oral residue : Tongue Initiation of pharyngeal swallow : Pyriform sinuses  Pharyngeal Impairment Domain: Pharyngeal Impairment Domain Soft palate elevation: Trace column of contrast or air between SP and PW Laryngeal elevation: Partial superior movement of  thyroid  cartilage/partial approximation of arytenoids to epiglottic petiole Anterior hyoid excursion: Partial anterior movement Epiglottic movement: Partial inversion Laryngeal vestibule closure: Incomplete, narrow column air/contrast in laryngeal vestibule Pharyngeal stripping wave : Present - diminished Pharyngeal contraction (A/P view only): N/A Pharyngoesophageal segment opening: Complete distension and complete duration, no obstruction of flow (as can be viewed - limited visibility) Tongue base retraction: Narrow column of contrast or air between tongue base and PPW Pharyngeal residue: Trace residue within or on pharyngeal structures Location of pharyngeal residue: Tongue base  Esophageal Impairment Domain: No data recorded Pill: No data recorded Penetration/Aspiration  Scale Score: Penetration/Aspiration Scale Score 2.  Material enters airway, remains ABOVE vocal cords then ejected out: Puree 3.  Material enters airway, remains ABOVE vocal cords and not ejected out: Mildly thick liquids (Level 2, nectar thick); Moderately thick liquids (Level 3, honey thick) 8.  Material enters airway, passes BELOW cords without attempt by patient to eject out (silent aspiration) : Thin liquids (Level 0) Compensatory Strategies: No data recorded  General Information: Caregiver present: No  Diet Prior to this Study: Full liquid diet; Thin liquids (Level 0)   Temperature : Normal   Respiratory Status: WFL   Supplemental O2: None (Room air)   History of Recent Intubation: Yes  Behavior/Cognition: Alert; Cooperative; Requires cueing Self-Feeding Abilities: Needs assist with self-feeding Baseline vocal quality/speech: Not observed No data recorded No data recorded Exam Limitations: Limited visibility Goal Planning: Prognosis for improved oropharyngeal function: Good Barriers to Reach Goals: Cognitive deficits; Language deficits No data recorded Patient/Family Stated Goal: difficulty stating Consulted and agree with results and recommendations: Patient; Nurse; Dietitian Pain: Pain Assessment Pain Assessment: Faces Faces Pain Scale: 0 Facial Expression: 0 Body Movements: 0 Muscle Tension: 0 Compliance with ventilator (intubated pts.): N/A Vocalization (extubated pts.): 0 CPOT Total: 0 End of Session: Start Time:SLP Start Time (ACUTE ONLY): 1154 Stop Time: SLP Stop Time (ACUTE ONLY): 1205 Time Calculation:SLP Time Calculation (min) (ACUTE ONLY): 11 min Charges: SLP Evaluations $ SLP Speech Visit: 1 Visit SLP Evaluations $MBS Swallow: 1 Procedure $Swallowing Treatment: 1 Procedure SLP visit diagnosis: SLP Visit Diagnosis: Dysphagia, oropharyngeal phase (R13.12) Past Medical History: Past Medical History: Diagnosis Date  Arthritis   qwhere (02/22/2018)  Barrett's esophagus with esophagitis 03/26/2013   Chronic abdominal pain   Chronic lower back pain   Chronic pancreatitis (HCC)   COPD (chronic obstructive pulmonary disease) (HCC)   Depression   Dyspnea   GERD (gastroesophageal reflux disease)   Heart murmur   Hiatal hernia   High cholesterol   Hypertension   Peptic ulcer   Pneumonia 2000s X 1  Stroke (HCC)   Tobacco use 03/26/2013 Past Surgical History: Past Surgical History: Procedure Laterality Date  AORTIC ARCH ANGIOGRAPHY N/A 01/19/2018  Procedure: AORTIC ARCH ANGIOGRAPHY;  Surgeon: Richrd Char, MD;  [facility];  Service: Cardiovascular;  Laterality: N/A;  BIOPSY  04/17/2017  Procedure: BIOPSY;  Surgeon: Suzette Espy, MD;  [facility];  Service: Endoscopy;;  esophageal  CARDIAC CATHETERIZATION    CAROTID-SUBCLAVIAN BYPASS GRAFT Left 03/19/2018  Procedure: BYPASS GRAFT CAROTID-SUBCLAVIAN;  Surgeon: Richrd Char, MD;  Location: [facility] OR;  Service: Vascular;  Laterality: Left;  CESAREAN SECTION  1985; 1988  COLONOSCOPY WITH PROPOFOL  N/A 04/17/2017  Procedure: COLONOSCOPY WITH PROPOFOL ;  Surgeon: Suzette Espy, MD;  [facility];  Service: Endoscopy;  Laterality: N/A;  100  ESOPHAGOGASTRODUODENOSCOPY  Oct 2011  Dr. Homero Luster: ulcer in distal esophagus, soft stricture at GE  junction s/p balloon dilation PATH: BARRETT'S  ESOPHAGOGASTRODUODENOSCOPY (EGD) WITH ESOPHAGEAL DILATION N/A 03/27/2013  Procedure: ESOPHAGOGASTRODUODENOSCOPY (EGD) WITH ESOPHAGEAL DILATION;  Surgeon: Suzette Espy, MD;  [facility];  Service: Endoscopy;  Laterality: N/A;  possible dilation  ESOPHAGOGASTRODUODENOSCOPY (EGD) WITH PROPOFOL  N/A 01/02/2017  Procedure: ESOPHAGOGASTRODUODENOSCOPY (EGD) WITH PROPOFOL ;  Surgeon: Suzette Espy, MD;  [facility];  Service: Endoscopy;  Laterality: N/A;  with possible esophageal dilation  ESOPHAGOGASTRODUODENOSCOPY (EGD) WITH PROPOFOL  N/A 04/17/2017  Procedure: ESOPHAGOGASTRODUODENOSCOPY (EGD) WITH PROPOFOL ;  Surgeon: Suzette Espy, MD;   [facility];  Service: Endoscopy;  Laterality: N/A;  EUS N/A 05/09/2013  Procedure: UPPER ENDOSCOPIC ULTRASOUND (EUS) LINEAR;  Surgeon: Janel Medford, MD;  Location: [facility];  Service: Endoscopy;  Laterality: N/A;  FRACTURE SURGERY    pt. denies  IR ANGIO INTRA EXTRACRAN SEL COM CAROTID INNOMINATE BILAT MOD SED  09/06/2022  IR ANGIO INTRA EXTRACRAN SEL COM CAROTID INNOMINATE BILAT MOD SED  09/13/2022  IR ANGIO VERTEBRAL SEL SUBCLAVIAN INNOMINATE BILAT MOD SED  09/13/2022  IR ANGIO VERTEBRAL SEL SUBCLAVIAN INNOMINATE UNI R MOD SED  09/06/2022  IR ANGIOGRAM FOLLOW UP STUDY  09/13/2022  IR CT HEAD LTD  09/08/2022  IR CT HEAD LTD  05/27/2024  IR CT HEAD LTD  05/27/2024  IR NEURO EACH ADD'L AFTER BASIC UNI LEFT (MS)  09/13/2022  IR PATIENT EVAL TECH 0-60 MINS  05/28/2024  IR PERCUTANEOUS ART THROMBECTOMY/INFUSION INTRACRANIAL INC DIAG ANGIO  05/27/2024  IR TRANSCATH/EMBOLIZ  09/08/2022  IR US  GUIDE VASC ACCESS RIGHT  09/08/2022  IR US  GUIDE VASC ACCESS RIGHT  09/06/2022  KNEE ARTHROSCOPY Right   LAPAROSCOPIC CHOLECYSTECTOMY    LEFT HEART CATH AND CORONARY ANGIOGRAPHY N/A 02/26/2018  Procedure: LEFT HEART CATH AND CORONARY ANGIOGRAPHY;  Surgeon: Odie Benne, MD;  [facility];  Service: Cardiovascular;  Laterality: N/A;  MALONEY DILATION N/A 04/17/2017  Procedure: Londa Rival DILATION;  Surgeon: Suzette Espy, MD;  [facility];  Service: Endoscopy;  Laterality: N/A;  PATELLA FRACTURE SURGERY Left   POLYPECTOMY  04/17/2017  Procedure: POLYPECTOMY;  Surgeon: Suzette Espy, MD;  [facility];  Service: Endoscopy;;  RADIOLOGY WITH ANESTHESIA N/A 09/08/2022  Procedure: L ICA Aneurysm Embolazation;  Surgeon: Luellen Sages, MD;  Location: [facility];  Service: Radiology;  Laterality: N/A;  RADIOLOGY WITH ANESTHESIA N/A 05/27/2024  Procedure: RADIOLOGY WITH ANESTHESIA;  Surgeon: Radiologist, Medication, MD;  Location: [facility] OR;  Service: Radiology;  Laterality: N/A;  TEE WITHOUT  CARDIOVERSION N/A 01/23/2019  Procedure: TRANSESOPHAGEAL ECHOCARDIOGRAM (TEE);  Surgeon: Hugh Madura, MD;  Location: [facility] ENDOSCOPY;  Service: Cardiovascular;  Laterality: N/A;  TUBAL LIGATION  1992  UPPER EXTREMITY ANGIOGRAPHY N/A 01/19/2018  Procedure: UPPER EXTREMITY ANGIOGRAPHY;  Surgeon: Richrd Char, MD;  [facility];  Service: Cardiovascular;  Laterality: N/A; Beth Brooke., M.A. CCC-SLP Acute Rehabilitation Services Office: [phone] Secure chat preferred 05/31/2024, 1:20 PM  DG Abd Portable 1V Result Date: 05/29/2024 CLINICAL DATA:  Feeding tube placement. EXAM: PORTABLE ABDOMEN - 1 VIEW COMPARISON:  May 27, 2024. FINDINGS: Distal tip of feeding tube is seen in expected position of distal stomach or proximal duodenum. IMPRESSION: Feeding tube tip seen in expected position of distal stomach or proximal duodenum. Electronically Signed   By: Rosalene Colon M.D.   On: 05/29/2024 13:53   IR PERCUTANEOUS ART THROMBECTOMY/INFUSION INTRACRANIAL INC DIAG ANGIO Result Date: 05/29/2024 INDICATION: Acute onset of left gaze deviation, right-sided hemiplegia,  right-sided neglect, and aphasia. 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 EXAM: 1. EMERGENT LARGE VESSEL OCCLUSION THROMBOLYSIS (anterior CIRCULATION) COMPARISON:  CT angiogram of the head and neck of 05/27/2024. MEDICATIONS: No antibiotic was administered within 1 hour of the procedure. ANESTHESIA/SEDATION: General anesthesia. CONTRAST:  Omnipaque  300 approximately 100 mL. FLUOROSCOPY TIME:  Fluoroscopy Time: 23 minutes 4 seconds (1502 mGy). COMPLICATIONS: None immediate. TECHNIQUE: An emergent 2 physician consent was obtained in view of the non availability of a patient's next of kin or relatives physically or from the telephone numbers listed in the patient's chart. The patient was then put under general anesthesia by the Department of Anesthesiology at [facility]. The right groin was prepped and draped in the usual sterile fashion. Thereafter using modified Seldinger technique, transfemoral access into the right common femoral artery was obtained without difficulty. Over an 0.035 inch guidewire an 8 French 25 cm Pinnacle sheath was inserted. Through this, and also over an 0.035 inch guidewire combination of a 125 cm 6 [place] Berenstein support catheter inside of an 088 100 cm Zoom support catheter was advanced to initially the common carotid artery and then to the distal left internal carotid artery at the cervical petrous junction. FINDINGS: The left common carotid arteriogram demonstrates patency of the common carotid artery subclavian bypass supplying the left subclavian artery. The left common carotid artery bifurcation demonstrates the left external carotid artery and its major branches to be widely patent. 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 No distal opacification of the left middle cerebral artery or the left anterior cerebral artery is evident. PROCEDURE: Through the Zoom support catheter and the distal petrous segment, a combination of an 068 134 cm Penumbra aspiration catheter with a 160 cm 021 Trevo ProVue microcatheter was advanced over an 018 inch Aristotle micro guidewire with a moderate J configuration to the distal end of the 088 support catheter. The micro guidewire was then advanced through the occluded flow diverter device without difficulty into the M2 segment of the inferior division followed by the microcatheter followed by the Zoom 068 aspiration catheter through the pipeline flow diverter device into the left mid M1 segment. The microcatheter and micro guidewire were removed as aspiration was then applied at the hub of  the 068 aspiration catheter and at the hub of the 088 support catheter which had been advanced just proximal to the pipeline flow diverter device. The 068 catheter was removed. A control arteriogram performed through the 088 support catheter demonstrated revascularization of the left anterior and left middle cerebral artery distributions. A TICI 2C revascularization was obtained. A control arteriogram through the support catheter revealed a moderate-sized filling defect in the supraclinoid left ICA, and also along the lower edge of the pipeline flow diverter device. A second pass was then made using a 120 cm 070 APRO aspiration catheter which was advanced in combination with an 021 microcatheter over an 018 inch Aristotle micro guidewire. Access was obtained with a microcatheter and micro guidewire in the M2 region of an inferior division branch. The 070 APRO catheter was advanced into the proximal left M1 segment. Aspiration was then applied at the hub of the APRO aspiration catheter, and also the Zoom support catheter following removal of the microcatheter.  After a minute and a half of aspiration, the combination was retrieved and removed. A control arteriogram performed through the Zoom support catheter demonstrated moderate improvement in the previously noted filling defects in the supraclinoid left ICA, and also along the lower portion of the flow diverter device. Spasm of the left M1 segment was treated with 3 aliquots of 25 mcg of Nitroglycerin  with complete relief. Over the next few minutes, decrease in the filling defect was noted within the pipeline flow diverter device. Also noted was decreased hemodynamic flow into the left MCA distribution. A follow-up biplane DSA now demonstrated progressively worsening flow through the left M1 distribution secondary to increasing filling defect within the supraclinoid left ICA, and in the proximal left MCA leading to complete occlusion of the left supraclinoid ICA and  the proximal left M1 segment. A third pass was made again using a combination of the APRO 125 070 aspiration catheter advanced again into the proximal M1 segment in conjunction with an 021 microcatheter and the micro guidewire. The micro guidewire and the microcatheter were removed. Aspiration was then applied for a minute and a half through the hub of the APRO intermediate catheter. This was then removed. A DSA through the support catheter now demonstrated improved flow through the supraclinoid left ICA and MCA distribution. Due to the aggressive platelet aggregation, it was decided to proceed with use of IV cangrelor  infusion in order to prevent further platelet aggregation. Left femoral venous access was then obtained due to the patient having only 1 IV at the start of the procedure. Attempts to obtain access via a vein were unsuccessful in both arms. DSAs performed at 10 and 20 minutes post commencement of the IV cangrelor  demonstrated gradual clearing of filling defects in the supraclinoid left ICA, the pipeline flow diverter device, and the in the left MCA distribution. A final control arteriogram performed through the Zoom support catheter in the left internal artery now demonstrated complete revascularization of the left MCA distribution and of the supraclinoid left ICA achieving a TICI 3 revascularization. The left anterior cerebral artery remained widely patent. A flap panel CT obtained at the end of the procedure demonstrated no evidence of intracranial hemorrhage or mass effect. An 8 French Angio-Seal closure device was deployed for hemostasis at the right groin puncture site. The left 4 French sheath was left in position to continue the 4 hour IV cangrelor  infusion. Distal pulses remained present in both feet at the end of the procedure. The patient was left intubated due to her medical condition. She was then transferred to neuro ICU for post revascularization care. Medications: Aspirin  81 mg, and  Brilinta  90 mg p.o. via a nasogastric tube after the first pass. IV half bolus dose of cangrelor , followed by a half dose infusion over 4 hours. CT scan of the brain to be obtained at the end of 4 hours. IMPRESSION: Status post revascularization of the occluded distal left internal carotid artery supraclinoid segment, and the proximal left middle cerebral M1 segment with 3 passes of contact aspiration achieving a TICI 3 revascularization. PLAN: As per Stroke Neurology. Electronically Signed   By: Luellen Sages M.D.   On: 05/29/2024 08:05   IR CT Head Ltd Result Date: 05/29/2024 INDICATION: Acute onset of left gaze deviation, right-sided hemiplegia, right-sided neglect, and aphasia. 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 EXAM: 1. EMERGENT LARGE VESSEL OCCLUSION THROMBOLYSIS (anterior CIRCULATION) COMPARISON:  CT angiogram of  the head and neck of 05/27/2024. MEDICATIONS: No antibiotic was administered within 1 hour of the procedure. ANESTHESIA/SEDATION: General anesthesia. CONTRAST:  Omnipaque  300 approximately 100 mL. FLUOROSCOPY TIME:  Fluoroscopy Time: 23 minutes 4 seconds (1502 mGy). COMPLICATIONS: None immediate. TECHNIQUE: An emergent 2 physician consent was obtained in view of the non availability of a patient's next of kin or relatives physically or from the telephone numbers listed in the patient's chart. The patient was then put under general anesthesia by the Department of Anesthesiology at [facility]. The right groin was prepped and draped in the usual sterile fashion. Thereafter using modified Seldinger technique, transfemoral access into the right common femoral artery was obtained without difficulty. Over an 0.035 inch guidewire an 8 French 25 cm Pinnacle sheath was inserted. Through this, and also over an 0.035 inch guidewire combination of a 125 cm 6 [place] Berenstein support catheter inside of an 088 100 cm Zoom  support catheter was advanced to initially the common carotid artery and then to the distal left internal carotid artery at the cervical petrous junction. FINDINGS: The left common carotid arteriogram demonstrates patency of the common carotid artery subclavian bypass supplying the left subclavian artery. The left common carotid artery bifurcation demonstrates the left external carotid artery and its major branches to be widely patent. 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 No distal opacification of the left middle cerebral artery or the left anterior cerebral artery is evident. PROCEDURE: Through the Zoom support catheter and the distal petrous segment, a combination of an 068 134 cm Penumbra aspiration catheter with a 160 cm 021 Trevo ProVue microcatheter was advanced over an 018 inch Aristotle micro guidewire with a moderate J configuration to the distal end of the 088 support catheter. The micro guidewire was then advanced through the occluded flow diverter device without difficulty into the M2 segment of the inferior division followed by the microcatheter followed by the Zoom 068 aspiration catheter through the pipeline flow diverter device into the left mid M1 segment. The microcatheter and micro guidewire were removed as aspiration was then applied at the hub of the 068 aspiration catheter and at the hub of the 088 support catheter which had been advanced just proximal to the pipeline flow diverter device. The 068 catheter was removed. A control arteriogram performed through the 088 support catheter demonstrated revascularization of the left anterior and left middle cerebral artery distributions. A TICI 2C revascularization was obtained. A control arteriogram through the support catheter  revealed a moderate-sized filling defect in the supraclinoid left ICA, and also along the lower edge of the pipeline flow diverter device. A second pass was then made using a 120 cm 070 APRO aspiration catheter which was advanced in combination with an 021 microcatheter over an 018 inch Aristotle micro guidewire. Access was obtained with a microcatheter and micro guidewire in the M2 region of an inferior division branch. The 070 APRO catheter was advanced into the proximal left M1 segment. Aspiration was then applied at the hub of the APRO aspiration catheter, and also the Zoom support catheter following removal of the microcatheter. After a minute and a half of aspiration, the combination was retrieved and removed. A control arteriogram performed through the Zoom support catheter demonstrated moderate improvement in the previously noted filling defects in the supraclinoid left ICA,  and also along the lower portion of the flow diverter device. Spasm of the left M1 segment was treated with 3 aliquots of 25 mcg of Nitroglycerin  with complete relief. Over the next few minutes, decrease in the filling defect was noted within the pipeline flow diverter device. Also noted was decreased hemodynamic flow into the left MCA distribution. A follow-up biplane DSA now demonstrated progressively worsening flow through the left M1 distribution secondary to increasing filling defect within the supraclinoid left ICA, and in the proximal left MCA leading to complete occlusion of the left supraclinoid ICA and the proximal left M1 segment. A third pass was made again using a combination of the APRO 125 070 aspiration catheter advanced again into the proximal M1 segment in conjunction with an 021 microcatheter and the micro guidewire. The micro guidewire and the microcatheter were removed. Aspiration was then applied for a minute and a half through the hub of the APRO intermediate catheter. This was then removed. A DSA through the  support catheter now demonstrated improved flow through the supraclinoid left ICA and MCA distribution. Due to the aggressive platelet aggregation, it was decided to proceed with use of IV cangrelor  infusion in order to prevent further platelet aggregation. Left femoral venous access was then obtained due to the patient having only 1 IV at the start of the procedure. Attempts to obtain access via a vein were unsuccessful in both arms. DSAs performed at 10 and 20 minutes post commencement of the IV cangrelor  demonstrated gradual clearing of filling defects in the supraclinoid left ICA, the pipeline flow diverter device, and the in the left MCA distribution. A final control arteriogram performed through the Zoom support catheter in the left internal artery now demonstrated complete revascularization of the left MCA distribution and of the supraclinoid left ICA achieving a TICI 3 revascularization. The left anterior cerebral artery remained widely patent. A flap panel CT obtained at the end of the procedure demonstrated no evidence of intracranial hemorrhage or mass effect. An 8 French Angio-Seal closure device was deployed for hemostasis at the right groin puncture site. The left 4 French sheath was left in position to continue the 4 hour IV cangrelor  infusion. Distal pulses remained present in both feet at the end of the procedure. The patient was left intubated due to her medical condition. She was then transferred to neuro ICU for post revascularization care. Medications: Aspirin  81 mg, and Brilinta  90 mg p.o. via a nasogastric tube after the first pass. IV half bolus dose of cangrelor , followed by a half dose infusion over 4 hours. CT scan of the brain to be obtained at the end of 4 hours. IMPRESSION: Status post revascularization of the occluded distal left internal carotid artery supraclinoid segment, and the proximal left middle cerebral M1 segment with 3 passes of contact aspiration achieving a TICI 3  revascularization. PLAN: As per Stroke Neurology. Electronically Signed   By: Luellen Sages M.D.   On: 05/29/2024 08:05   IR CT Head Ltd Result Date: 05/29/2024 INDICATION: Acute onset of left gaze deviation, right-sided hemiplegia, right-sided neglect, and aphasia. 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 EXAM: 1. EMERGENT LARGE VESSEL OCCLUSION THROMBOLYSIS (anterior CIRCULATION) COMPARISON:  CT angiogram of the head and neck of 05/27/2024. MEDICATIONS: No antibiotic was administered within 1 hour of the procedure. ANESTHESIA/SEDATION: General anesthesia. CONTRAST:  Omnipaque  300 approximately 100 mL. FLUOROSCOPY TIME:  Fluoroscopy Time: 23 minutes 4 seconds (1502  mGy). COMPLICATIONS: None immediate. TECHNIQUE: An emergent 2 physician consent was obtained in view of the non availability of a patient's next of kin or relatives physically or from the telephone numbers listed in the patient's chart. The patient was then put under general anesthesia by the Department of Anesthesiology at [facility]. The right groin was prepped and draped in the usual sterile fashion. Thereafter using modified Seldinger technique, transfemoral access into the right common femoral artery was obtained without difficulty. Over an 0.035 inch guidewire an 8 French 25 cm Pinnacle sheath was inserted. Through this, and also over an 0.035 inch guidewire combination of a 125 cm 6 [place] Berenstein support catheter inside of an 088 100 cm Zoom support catheter was advanced to initially the common carotid artery and then to the distal left internal carotid artery at the cervical petrous junction. FINDINGS: The left common carotid arteriogram demonstrates patency of the common carotid artery subclavian bypass supplying the left subclavian artery. The left common carotid artery bifurcation demonstrates the left external carotid artery and its major branches to be  widely patent. 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 No distal opacification of the left middle cerebral artery or the left anterior cerebral artery is evident. PROCEDURE: Through the Zoom support catheter and the distal petrous segment, a combination of an 068 134 cm Penumbra aspiration catheter with a 160 cm 021 Trevo ProVue microcatheter was advanced over an 018 inch Aristotle micro guidewire with a moderate J configuration to the distal end of the 088 support catheter. The micro guidewire was then advanced through the occluded flow diverter device without difficulty into the M2 segment of the inferior division followed by the microcatheter followed by the Zoom 068 aspiration catheter through the pipeline flow diverter device into the left mid M1 segment. The microcatheter and micro guidewire were removed as aspiration was then applied at the hub of the 068 aspiration catheter and at the hub of the 088 support catheter which had been advanced just proximal to the pipeline flow diverter device. The 068 catheter was removed. A control arteriogram performed through the 088 support catheter demonstrated revascularization of the left anterior and left middle cerebral artery distributions. A TICI 2C revascularization was obtained. A control arteriogram through the support catheter revealed a moderate-sized filling defect in the supraclinoid left ICA, and also along the lower edge of the pipeline flow diverter device. A second pass was then made using a 120 cm 070 APRO aspiration catheter which was advanced in combination with an 021 microcatheter over an 018 inch Aristotle micro guidewire. Access was obtained with a microcatheter and micro guidewire in the M2 region of an inferior division branch.  The 070 APRO catheter was advanced into the proximal left M1 segment. Aspiration was then applied at the hub of the APRO aspiration catheter, and also the Zoom support catheter following removal of the microcatheter. After a minute and a half of aspiration, the combination was retrieved and removed. A control arteriogram performed through the Zoom support catheter demonstrated moderate improvement in the previously noted filling defects in the supraclinoid left ICA, and also along the lower portion of the flow diverter device. Spasm of the left M1 segment was treated with 3 aliquots of 25 mcg of Nitroglycerin  with complete relief. Over the next few minutes, decrease in  the filling defect was noted within the pipeline flow diverter device. Also noted was decreased hemodynamic flow into the left MCA distribution. A follow-up biplane DSA now demonstrated progressively worsening flow through the left M1 distribution secondary to increasing filling defect within the supraclinoid left ICA, and in the proximal left MCA leading to complete occlusion of the left supraclinoid ICA and the proximal left M1 segment. A third pass was made again using a combination of the APRO 125 070 aspiration catheter advanced again into the proximal M1 segment in conjunction with an 021 microcatheter and the micro guidewire. The micro guidewire and the microcatheter were removed. Aspiration was then applied for a minute and a half through the hub of the APRO intermediate catheter. This was then removed. A DSA through the support catheter now demonstrated improved flow through the supraclinoid left ICA and MCA distribution. Due to the aggressive platelet aggregation, it was decided to proceed with use of IV cangrelor  infusion in order to prevent further platelet aggregation. Left femoral venous access was then obtained due to the patient having only 1 IV at the start of the procedure. Attempts to obtain access via a vein were unsuccessful in  both arms. DSAs performed at 10 and 20 minutes post commencement of the IV cangrelor  demonstrated gradual clearing of filling defects in the supraclinoid left ICA, the pipeline flow diverter device, and the in the left MCA distribution. A final control arteriogram performed through the Zoom support catheter in the left internal artery now demonstrated complete revascularization of the left MCA distribution and of the supraclinoid left ICA achieving a TICI 3 revascularization. The left anterior cerebral artery remained widely patent. A flap panel CT obtained at the end of the procedure demonstrated no evidence of intracranial hemorrhage or mass effect. An 8 French Angio-Seal closure device was deployed for hemostasis at the right groin puncture site. The left 4 French sheath was left in position to continue the 4 hour IV cangrelor  infusion. Distal pulses remained present in both feet at the end of the procedure. The patient was left intubated due to her medical condition. She was then transferred to neuro ICU for post revascularization care. Medications: Aspirin  81 mg, and Brilinta  90 mg p.o. via a nasogastric tube after the first pass. IV half bolus dose of cangrelor , followed by a half dose infusion over 4 hours. CT scan of the brain to be obtained at the end of 4 hours. IMPRESSION: Status post revascularization of the occluded distal left internal carotid artery supraclinoid segment, and the proximal left middle cerebral M1 segment with 3 passes of contact aspiration achieving a TICI 3 revascularization. PLAN: As per Stroke Neurology. Electronically Signed   By: Luellen Sages M.D.   On: 05/29/2024 08:05   MR BRAIN WO CONTRAST Result Date: 05/29/2024 EXAM: MRI BRAIN WITHOUT CONTRAST 05/28/2024 04:35:37 PM TECHNIQUE: Multiplanar multisequence MRI of the head/brain was performed without the administration of intravenous contrast. COMPARISON: CT head without contrast 05/28/2024. MR head without contrast  05/19/2023. CLINICAL HISTORY: Stroke, follow up; 24 hrs post-TNK r/o hemorrhagic conversion. Revascularization of occluded left internal carotid artery 05/27/2024. FINDINGS: BRAIN AND VENTRICLES: A large acute nonhemorrhagic left MCA territory ischemic infarct is present. Acute nonhemorrhagic infarct is present in the posterior left lentiform nucleus. Acute nonhemorrhagic left periventricular acute white matter infarcts are present. No acute hemorrhage is present. Remote cortical infarcts are present in the left parietal lobe and along the watershed distribution over the left hemisphere. Remote inferior cerebellar infarcts are present, right  greater than left. No mass. No midline shift. No hydrocephalus. The sella is unremarkable. Normal flow voids. ORBITS: No acute abnormality. SINUSES AND MASTOIDS: Fluid in the nasopharynx is likely secondary to intubation. Moderate mucosal disease is present in the sphenoid sinus bilaterally and posterior maxillary sinuses. Mild mucosal thickening is present in the anterior ethmoid air cells and inferior frontal sinuses. BONES AND SOFT TISSUES: Normal marrow signal. No acute soft tissue abnormality. IMPRESSION: 1. No acute hemorrhage. 2. Large acute nonhemorrhagic left MCA territory ischemic infarct. 3. Acute nonhemorrhagic infarct in the posterior left lentiform nucleus. 4. Acute nonhemorrhagic left periventricular white matter infarcts. 5. Remote cortical infarcts in the left parietal lobe and along the watershed distribution over the left hemisphere. Remote inferior cerebellar infarcts, right greater than left. 6. Moderate mucosal disease in the sphenoid sinus bilaterally and posterior maxillary sinuses. Mild mucosal thickening in the anterior ethmoid air cells and inferior frontal sinuses. The pertinent results were texted to Dr. Murvin Arthurs via the [facility] system at 5:22am. Electronically signed by: Audree Leas MD 05/29/2024 05:23 AM EDT RP Workstation: [id number]    ECHOCARDIOGRAM COMPLETE BUBBLE STUDY Result Date: 05/28/2024    ECHOCARDIOGRAM REPORT   Patient Name:   Rita [name] Date of Exam: 05/28/2024 Medical Rec #:  [id number]     Height:       67.0 in Accession #:    [id number]    Weight:       224.9 lb Date of Birth:  [birth year]     BSA:          2.126 m Patient Age:    57 years      BP:           120/61 mmHg Patient Gender: F             HR:           86 bpm. Exam Location:  Inpatient Procedure: 2D Echo, Color Doppler and Cardiac Doppler (Both Spectral and Color            Flow Doppler were utilized during procedure). Indications:    Stroke  History:        Patient has prior history of Echocardiogram examinations, most                 recent 03/31/2023. Risk Factors:Hypertension.  Sonographer:    Jeralene Mom Referring Phys: [id number] Alphonza Ashing STACK IMPRESSIONS  1. Left ventricular ejection fraction, by estimation, is 65 to 70%. The left ventricle has hyperdynamic function. The left ventricle has no regional wall motion abnormalities. Left ventricular diastolic parameters are consistent with Grade I diastolic dysfunction (impaired relaxation).  2. Right ventricular systolic function is normal. The right ventricular size is normal. There is mildly elevated pulmonary artery systolic pressure. The estimated right ventricular systolic pressure is 37.1 mmHg.  3. The mitral valve is normal in structure. No evidence of mitral valve regurgitation. No evidence of mitral stenosis.  4. Increased aortic valve velocities are at least in part secondary to hyperdynamic state/increased cardiac output. The aortic valve is tricuspid. There is mild thickening of the aortic valve. Aortic valve regurgitation is not visualized. Aortic valve sclerosis is present, with no evidence of aortic valve stenosis.  5. Agitated saline contrast bubble study was negative, with no evidence of any interatrial shunt. FINDINGS  Left Ventricle: Left ventricular ejection fraction, by estimation, is 65 to  70%. The left ventricle has hyperdynamic function. The left ventricle has no regional wall motion abnormalities. The left ventricular  internal cavity size was small. There is no  left ventricular hypertrophy. Left ventricular diastolic parameters are consistent with Grade I diastolic dysfunction (impaired relaxation). Indeterminate filling pressures. Right Ventricle: The right ventricular size is normal. Right vetricular wall thickness was not well visualized. Right ventricular systolic function is normal. There is mildly elevated pulmonary artery systolic pressure. The tricuspid regurgitant velocity  is 2.92 m/s, and with an assumed right atrial pressure of 3 mmHg, the estimated right ventricular systolic pressure is 37.1 mmHg. Left Atrium: Left atrial size was normal in size. Right Atrium: Right atrial size was normal in size. Pericardium: There is no evidence of pericardial effusion. Mitral Valve: The mitral valve is normal in structure. No evidence of mitral valve regurgitation. No evidence of mitral valve stenosis. Tricuspid Valve: The tricuspid valve is normal in structure. Tricuspid valve regurgitation is trivial. Aortic Valve: Increased aortic valve velocities are at least in part secondary to hyperdynamic state/increased cardiac output. The aortic valve is tricuspid. There is mild thickening of the aortic valve. Aortic valve regurgitation is not visualized. Aortic valve sclerosis is present, with no evidence of aortic valve stenosis. Aortic valve mean gradient measures 15.7 mmHg. Aortic valve peak gradient measures 30.5 mmHg. Aortic valve area, by VTI measures 2.42 cm. Pulmonic Valve: The pulmonic valve was grossly normal. Pulmonic valve regurgitation is not visualized. No evidence of pulmonic stenosis. Aorta: The aortic root is normal in size and structure. IAS/Shunts: No atrial level shunt detected by color flow Doppler. Agitated saline contrast bubble study was negative, with no evidence of any  interatrial shunt.  LEFT VENTRICLE PLAX 2D LVIDd:         4.80 cm   Diastology LVIDs:         3.00 cm   LV e' medial:    7.29 cm/s LV PW:         0.90 cm   LV E/e' medial:  12.5 LV IVS:        1.10 cm   LV e' lateral:   9.90 cm/s LVOT diam:     2.20 cm   LV E/e' lateral: 9.2 LV SV:         113 LV SV Index:   53 LVOT Area:     3.80 cm  LEFT ATRIUM           Index LA diam:      3.80 cm 1.79 cm/m LA Vol (A4C): 45.4 ml 21.36 ml/m  AORTIC VALVE AV Area (Vmax):    2.36 cm AV Area (Vmean):   2.39 cm AV Area (VTI):     2.42 cm AV Vmax:           276.00 cm/s AV Vmean:          180.000 cm/s AV VTI:            0.469 m AV Peak Grad:      30.5 mmHg AV Mean Grad:      15.7 mmHg LVOT Vmax:         171.00 cm/s LVOT Vmean:        113.000 cm/s LVOT VTI:          0.298 m LVOT/AV VTI ratio: 0.64  AORTA Ao Root diam: 2.90 cm MITRAL VALVE                TRICUSPID VALVE MV Area (PHT): 3.31 cm     TR Peak grad:   34.1 mmHg MV Decel Time: 229 msec  TR Vmax:        292.00 cm/s MV E velocity: 91.30 cm/s MV A velocity: 122.00 cm/s  SHUNTS MV E/A ratio:  0.75         Systemic VTI:  0.30 m                             Systemic Diam: 2.20 cm Karyl Paget Croitoru MD Electronically signed by Luana Rumple MD Signature Date/Time: 05/28/2024/1:10:16 PM    Final    IR PATIENT EVAL TECH 0-60 MINS Result Date: 05/28/2024 Rita [name]     05/28/2024 10:23 AM Left 4 french venous femoral sheath removed at 1000. Manual pressure applied to obtain hemostasis at 1010. Dressing applied and site reviewed with [facility]. No acute complications. Distal pulses intact.   Portable Chest xray Result Date: 05/28/2024 CLINICAL DATA:  Status post ET tube placement. EXAM: PORTABLE CHEST 1 VIEW COMPARISON:  05/27/2024 FINDINGS: The endotracheal tube tip is 5.5 cm above the carina. Enteric tube courses below the GE junction. Stable cardiomediastinal contours. Mild subsegmental atelectasis in the left midlung and medial right lung base. No significant pleural  effusion, interstitial edema or airspace disease. No pneumothorax identified. Visualized osseous structures appear intact. IMPRESSION: 1. Satisfactory position of endotracheal tube. 2. Mild subsegmental atelectasis in the left midlung and medial right lung base. Electronically Signed   By: Kimberley Penman M.D.   On: 05/28/2024 06:08   CT HEAD WO CONTRAST ( ) Result Date: 05/28/2024 EXAM: CT HEAD WITHOUT 05/28/2024 05:25:22 AM TECHNIQUE: CT of the head was performed without the administration of intravenous contrast. Automated exposure control, iterative reconstruction, and/or weight based adjustment of the mA/kV was utilized to reduce the radiation dose to as low as reasonably achievable. COMPARISON: CT head without contrast 05/27/2024. CT head without contrast 05/19/2023. CLINICAL HISTORY: Status post revascularization of occluded left ICA. Stroke, follow up. FINDINGS: BRAIN AND VENTRICLES: Remote left occipital and posterior watershed infarcts are stable. Remote cerebellar infarcts are again noted, more prominent on the right. There is no acute intracranial hemorrhage, mass effect or midline shift. No abnormal extra-axial fluid collection. The gray-white differentiation is maintained without an acute infarct. There is no hydrocephalus. ORBITS: The visualized portion of the orbits demonstrate no acute abnormality. SINUSES: Mild mucosal thickening is present in the sphenoid sinuses bilaterally. No fluid levels are present. Mild mucosal thickening is present in the anterior ethmoid air cells and inferior frontal sinuses. The mastoid air cells are clear. SOFT TISSUES AND SKULL: The patient is intubated. OG tube is in place. No acute abnormality of the visualized skull or soft tissues. IMPRESSION: 1. Stable remote left occipital and posterior watershed infarcts. 2. Stable remote cerebellar infarcts, more prominent on the right. Electronically signed by: Audree Leas MD 05/28/2024 05:47 AM EDT RP Workstation:  [id number]   CT ANGIO HEAD NECK W WO CM Result Date: 05/28/2024 EXAM: CTA HEAD AND NECK WITH AND WITHOUT 05/28/2024 05:25:22 AM TECHNIQUE: CTA of the head and neck was performed with and without the administration of intravenous contrast. Multiplanar 2D and/or 3D reformatted images are provided for review. Automated exposure control, iterative reconstruction, and/or weight based adjustment of the mA/kV was utilized to reduce the radiation dose to as low as reasonably achievable. Stenosis of the internal carotid arteries measured using NASCET criteria. COMPARISON: CT head without contrast 05/28/2024 at 05:19 am and CT angio head and neck 05/27/2024 CLINICAL HISTORY: Neuro deficit, acute, stroke suspected. Status post revascularization of  occluded flow diverter stent in the left internal carotid artery. Revascularization of left M1 segment. FINDINGS: CTA NECK: AORTIC ARCH AND ARCH VESSELS: Mild atherosclerotic calcifications are again noted at the aortic arch. The great vessel origins are widely patent. CAROTID ARTERIES: Atherosclerotic changes are present at the right carotid bifurcation without significant stenosis. Mild mural plaque is present in the left common carotid artery and bifurcation without significant stenosis or change. The left internal carotid artery is revascularized. VERTEBRAL ARTERIES: The proximal left vertebral artery is occluded. The vessel is reconstituted in the distal left v2 segment. High-grade stenosis and occlusion of the right vertebral artery at the v3 segment is noted. The left vertebral artery is occluded just below the dural margin, stable. The distal vertebral arteries are reconstituted. SOFT TISSUES: The lung apices are clear. No cervical or superior mediastinal lymphadenopathy. The larynx and pharynx are unremarkable. No acute abnormality of the salivary and thyroid  glands. BONES: No acute osseous abnormality. CTA HEAD: ANTERIOR CIRCULATION: Atherosclerotic calcifications are  again noted in the supraclinoid right ICA without significant stenosis through the ICA terminus. The left cavernous ICA stent is patent. The A1 and M1 segments are patent. Mild proximal left M1 segment narrowing is present. The ACA and MCA branch vessels opacify normally on both sides. POSTERIOR CIRCULATION: The basilar artery is small. Prominent iliac vessels are again noted. The superior cerebellar arteries are patent bilaterally, with posterior cerebral arteries originating from the basilar tip. Moderate attenuation of distal PCA branch vessels is present bilaterally, worse on the right. OTHER: No dural venous sinus thrombosis on this non-dedicated study. BRAIN: There is no acute infarct or acute intracranial hemorrhage. No mass effect or midline shift. IMPRESSION: 1. Status post revascularization of occluded flow diverter stent in the left internal carotid artery and revascularization of left M1 segment. Mild proximal left M1 segment narrowing. 2. High-grade stenosis and occlusion of the right vertebral artery at the V3 segment, stable. 3. Mild mural plaque in the left common carotid artery and bifurcation without significant stenosis or change. 4. Atherosclerotic changes at the right carotid bifurcation without significant stenosis. 5. Occlusion of the left vertebral artery just below the dural margin, stable. 6. Moderate attenuation of distal PCA branch vessels bilaterally, worse on the right. Electronically signed by: Audree Leas MD 05/28/2024 05:44 AM EDT RP Workstation: [id number]   CT HEAD POST STROKE FOLLOWUP/TIMED/STAT READ Result Date: 05/27/2024 CLINICAL DATA:  Code stroke.  Neuro deficit, acute, stroke suspected EXAM: CT HEAD WITHOUT CONTRAST TECHNIQUE: Contiguous axial images were obtained from the base of the skull through the vertex without intravenous contrast. RADIATION DOSE REDUCTION: This exam was performed according to the departmental dose-optimization program which includes  automated exposure control, adjustment of the mA and/or kV according to patient size and/or use of iterative reconstruction technique. COMPARISON:  CT head May 27, 2024. FINDINGS: Brain: Similar remote infarcts in the left cerebral hemisphere and cerebellum. No evidence of acute large vascular territory infarct, acute hemorrhage, mass lesion, midline shift or hydrocephalus. Vascular: Calcific atherosclerosis. Skull: No acute fracture. Sinuses/Orbits: Paranasal sinus mucosal thickening. No acute orbital findings. ASPECTS [facility] Stroke Program Early CT Score) Total score (0-10 with 10 being normal): 10. IMPRESSION: 1. No evidence of acute intracranial abnormality.  ASPECTS is 10. 2. Similar remote infarcts. Electronically Signed   By: Stevenson Elbe M.D.   On: 05/27/2024 23:04   DG Abd 1 View Result Date: 05/27/2024 CLINICAL DATA:  OG tube placement EXAM: ABDOMEN - 1 VIEW COMPARISON:  01/20/2019 FINDINGS: Limited field  of view for tube placement verification purposes. An enteric tube is present with tip projecting over the right upper quadrant consistent location of the distal stomach or proximal duodenum. Visualized bowel gas pattern is normal. Mild perihilar infiltration is suggested in the lungs. Surgical clip in the right upper quadrant. Residual contrast material in the urinary tract. IMPRESSION: Enteric tube tip projects over the right upper quadrant consistent with location in the distal stomach or proximal duodenum. Electronically Signed   By: Boyce Byes M.D.   On: 05/27/2024 20:24   Portable Chest x-ray Result Date: 05/27/2024 CLINICAL DATA:  Endotracheal tube.  OG tube placement. EXAM: PORTABLE CHEST 1 VIEW COMPARISON:  CT chest 09/02/2022 FINDINGS: Endotracheal tube is present with tip measuring 4.2 cm above the carina. Enteric tube is present. Tip is off the field of view but below the left hemidiaphragm. Shallow inspiration. Cardiac enlargement. Perihilar infiltrates, greater on the left.  This may represent edema or pneumonia. No pleural effusion or pneumothorax. Patient is rotated towards the right. IMPRESSION: 1. Appliances appear in satisfactory position. 2. Cardiac enlargement. 3. Perihilar infiltrates, greater on the left, possibly pneumonia or edema. Electronically Signed   By: Boyce Byes M.D.   On: 05/27/2024 20:23   CT ANGIO HEAD NECK W WO CM (CODE STROKE) Result Date: 05/27/2024 CLINICAL DATA:  Neuro deficit, concern for stroke, right-sided weakness. EXAM: CT ANGIOGRAPHY HEAD AND NECK WITH AND WITHOUT CONTRAST TECHNIQUE: Multidetector CT imaging of the head and neck was performed using the standard protocol during bolus administration of intravenous contrast. Multiplanar CT image reconstructions and MIPs were obtained to evaluate the vascular anatomy. Carotid stenosis measurements (when applicable) are obtained utilizing NASCET criteria, using the distal internal carotid diameter as the denominator. RADIATION DOSE REDUCTION: This exam was performed according to the departmental dose-optimization program which includes automated exposure control, adjustment of the mA and/or kV according to patient size and/or use of iterative reconstruction technique. CONTRAST:  75mL OMNIPAQUE  IOHEXOL  350 MG/ML SOLN COMPARISON:  Same day CT head.  CTA head and neck 03/31/2023. FINDINGS: CTA NECK FINDINGS Aortic arch: Standard configuration of the aortic arch. Imaged portion shows no evidence of aneurysm or dissection. Atherosclerosis of the visualized aortic arch involving the origins of the aortic arch vessels. There is prominent noncalcified atherosclerotic plaque involving the proximal left subclavian artery. Additional noncalcified atherosclerotic plaque involving the brachiocephalic artery origin resulting in mild stenosis. Pulmonary arteries: As permitted by contrast timing, there are no filling defects in the visualized pulmonary arteries. Subclavian arteries: Similar focal occlusion of the  proximal left subclavian artery with reconstitution of the distal left subclavian. Right subclavian arteries patent. Right carotid system: Patent from the origin to the skull base. Atherosclerosis at the carotid bifurcation without stenosis greater than 50%. No evidence of dissection. Left carotid system: Patent from the origin to the carotid bifurcation. Multifocal atherosclerosis of the common carotid artery. Redemonstrated left common carotid to left subclavian artery bypass which appears patent. There is noncalcified atherosclerotic plaque at the carotid bifurcation. The external carotid artery branches are patent. There is abrupt occlusion of the proximal cervical ICA. Vertebral arteries: The right vertebral artery is patent from the origin to the V3 segment which is significantly diminished in caliber similar to prior. There is severely diminished caliber and intraluminal contrast within the intracranial vertebral artery. The left vertebral artery demonstrates intermittent contrast opacification primarily within the V2 segment. The left vertebral artery origins not well visualized. The left V3 segment is patent with poor contrast opacification of  the left V4 segment similar to prior. Skeleton: No acute or aggressive finding noted. Other neck: The visualized airway is patent. No cervical lymphadenopathy. Upper chest: Visualized lung apices are clear. Review of the MIP images confirms the above findings CTA HEAD FINDINGS ANTERIOR CIRCULATION: The left internal carotid artery is occluded from the cervical segment to the distal supraclinoid ICA. There is reconstitution at the ICA terminus likely via the circle-of-Willis. The right internal carotid artery is patent to the ICA terminus. Atherosclerosis of the right carotid siphon with moderate stenosis of the supraclinoid ICA. MCAs: Patent bilaterally. Mild narrowing at the origin of the left M1 segment similar to prior. Moderate stenosis of a proximal M2 inferior  division branch of the left MCA. ACAs: Patent bilaterally. Moderate narrowing of the left A2 segment. POSTERIOR CIRCULATION: Small caliber basilar artery similar to prior without evidence of focal occlusion. There is poor contrast opacification of both PCAs similar to prior. The SCA is are visualized proximally. Venous sinuses: As permitted by contrast timing, patent. Anatomic variants: None Review of the MIP images confirms the above findings IMPRESSION: Occlusion of the left internal carotid artery from the proximal cervical segment to the ICA terminus likely secondary to noncalcified atherosclerotic plaque near the carotid bifurcation. Reconstitution of the ICA terminus via the circle-of-Willis. Multifocal atherosclerosis in the neck. Redemonstrated multifocal occlusion of the left vertebral artery in the neck. Poor contrast opacification of the V3 and V4 segments bilaterally. Similar occlusion of the proximal left subclavian artery with patent left common carotid to left subclavian bypass noted. Intracranial atherosclerotic disease as above. Moderate stenosis of the right supraclinoid ICA. Focal moderate stenosis of a proximal M2 branch of the left MCA. Moderate stenosis of the A2 segment left ACA. These results were called by telephone at the time of interpretation on 05/27/2024 at 4:15 pm to provider Dr. Liam Redhead, who verbally acknowledged these results. Electronically Signed   By: Denny Flack M.D.   On: 05/27/2024 16:16   CT HEAD CODE STROKE WO CONTRAST Result Date: 05/27/2024 CLINICAL DATA:  Code stroke. Neuro deficit, concern for stroke, right-sided weakness. EXAM: CT HEAD WITHOUT CONTRAST TECHNIQUE: Contiguous axial images were obtained from the base of the skull through the vertex without intravenous contrast. RADIATION DOSE REDUCTION: This exam was performed according to the departmental dose-optimization program which includes automated exposure control, adjustment of the mA and/or kV according to  patient size and/or use of iterative reconstruction technique. COMPARISON:  CT head and CTA head and neck 03/31/2023. FINDINGS: Brain: No acute intracranial hemorrhage. Similar appearance of encephalomalacia throughout the right cerebral hemisphere particularly in the frontoparietal lobes and superior left occipital lobe. Additional remote infarcts in the bilateral cerebellum. Nonspecific hypoattenuation in the periventricular and subcortical white matter favored to reflect chronic microvascular ischemic changes. No edema, mass effect, or midline shift. The basilar cisterns are patent. Ventricles: Slight ex vacuo dilatation of the right lateral ventricle. No hydrocephalus. Vascular: Atherosclerosis in the right carotid siphon. Redemonstrated stent in the left carotid siphon. Skull: No acute or aggressive finding. Orbits: Orbits are symmetric. Sinuses: Mucosal thickening in the ethmoid sinuses. Other: Mastoid air cells are clear. ASPECTS [facility] Stroke Program Early CT Score) - Ganglionic level infarction (caudate, lentiform nuclei, internal capsule, insula, M1-M3 cortex): 7 - Supraganglionic infarction (M4-M6 cortex): 3 Total score (0-10 with 10 being normal): 10 IMPRESSION: 1. No CT evidence of acute intracranial abnormality. 2. Similar remote infarcts in the left cerebral hemisphere and bilateral cerebellum. Chronic microvascular ischemic changes. 3. ASPECTS is 10  These results were called by telephone at the time of interpretation on 05/27/2024 at 3:22 pm to provider Dorenda Gandy , who verbally acknowledged these results. Electronically Signed   By: Denny Flack M.D.   On: 05/27/2024 15:22       HISTORY OF PRESENT ILLNESS 58 y.o. female with history of smoking, left carotid subclavian bypass graft, right lower lung mass, COPD, chronic pancreatitis, depression, GERD, hyperlipidemia, hypertension and previous stroke admitted for acute onset right-sided weakness and aphasia.  Patient was given TNK at [facility] and was transferred here for thrombectomy.  Thrombectomy was successful with revascularization of left ICA and left M1 segment achieved with 3 passes.  NIH on Admission 18    HOSPITAL COURSE Stroke: Large left MCA territory infarcts due to left distal ICA stent and MCA occlusion s/p TNK and IR with TICI3 reperfusion Etiology: Large vessel disease Code Stroke CT head No acute abnormality. ASPECTS 10.    CTA head & neck occlusion of the left ICA from proximal cervical segment to ICA terminus, left common carotid to left subclavian bypass noted, moderate stenosis of right supraclinoid ICA, focal moderate stenosis of proximal M2 branch of left MCA, moderate stenosis of A2 segment of left ACA IR with occluded left ICA at the level of the previously positioned siphon flow diverter stent.  Status post IR with TICI3 MRI - Large acute nonhemorrhagic left MCA territory ischemic infarct. Acute nonhemorrhagic infarct in the posterior left lentiform nucleus. Acute nonhemorrhagic left periventricular white matter infarcts. Remote cortical infarcts in the left parietal lobe and along the watershed distribution over the left hemisphere. Remote inferior cerebellar infarcts, right greater than left. Moderate mucosal disease in the sphenoid sinus bilaterally and posterior maxillary sinuses. Mild mucosal thickening in the anterior ethmoid air cells and inferior frontal sinuses. 2D Echo EF 65 to 70%, grade 1 diastolic dysfunction, no interatrial shunt, normal left atrial size LDL 97 HgbA1c 6.3 UDS positive for cocaine and THC VTE prophylaxis -Lovenox  aspirin  81 mg [name] and Brilinta  (ticagrelor ) 90 mg bid prior to admission, continue aspirin  and Brilinta , duration per IR Therapy recommendations: CIR Disposition: CIR   Hx of Stroke/TIA 12/2018 admitted for left occipital and MCA/ACA infarct.  MRA head and neck showed left VA occlusion, right M1 moderate stenosis, left A1 moderate stenosis.  EF 60 to 65%.   No DVT.  LDL 83, A1c 5.7, UDS positive for THC.  Discharged on DAPT and Lipitor  80 08/2022 admitted for left frontal and parietal watershed infarcts.  CTA head and neck left VA chronic occlusion, right V3 occlusion.  Moderate to severe left siphon and moderate right siphon stenosis.  Status post left ICA stent stenting.   Hypertension Home meds: None Stable now Monitor BP goal normotensive  Hyperlipidemia Home meds: Atorvastatin  80 mg [name] and ezetimibe  10 mg [name] LDL- 97, goal less than 70 Continue Lipitor  80 and Zetia  10 Continue statin and Zetia  at discharge   Tobacco Abuse Patient smokes 0.5 packs per day for 41 years Will assess readiness to quit when patient aphasia improves, rehab aware   Substance Abuse UDS positive for  THC and cocaine Will assess readiness to quit when patient aphasia improves, rehab aware TOC consult for cessation  Dysphagia Patient has post-stroke dysphagia, SLP consulted  Continue dysphagia 1 nectar thick liquid  CorTrak removed 6/10 die to patient's improved PO intake Advance diet as tolerated   Other Stroke Risk Factors Obesity, Body mass index is 34.05 kg/m., BMI >/= 30 associated with  increased stroke risk, recommend weight loss, diet and exercise as appropriate  PAD, history of left CCA to subclavian artery bypass in 2019   Other Active Problems History of right lower lobe mass Ambulatory referral for lung cancer screening placed   DISCHARGE EXAM Blood pressure (!) 101/52, pulse 65, temperature 98.4 F (36.9 C), temperature source Oral, resp. rate 16, weight 98.6 kg, last menstrual period 02/09/2012, SpO2 99%. PHYSICAL EXAM General:  Alert, well-nourished, well-developed patient in no acute distress Psych:  Mood and affect appropriate for situation CV: Regular rate and rhythm on monitor Respiratory: Respirations regular and unlabored on room air   Neuro: Awake and alert.  Global aphasia with ability to intermittently mimic very  simple commands such as eyes open close and thumbs up.  She is not able to repeat full sentences. EOMI, tracks examiner.  Decreased blink to threat on right. Mild right facial droop. Tongue protrusion seems midline, on slight protrusion RUE 4+/5.  L UE, BLE 5/5. Ataxia seen in RUE. Unable to assess coordination due to patient's aphasia. Gait: ambulating in room with no assistance.   NIH on discharge: 9  Discharge Diet      Diet   DIET - DYS 1 Room service appropriate? No; Fluid consistency: Nectar Thick   liquids  DISCHARGE PLAN Disposition:  Transfer to [facility] for ongoing PT, OT and ST aspirin  81 mg [name] and Brilinta  (ticagrelor ) 90 mg bid for secondary stroke prevention  Recommend ongoing stroke risk factor control by Primary Care Physician at time of discharge from inpatient rehabilitation. Follow-up PCP Center, [facility] in 2 weeks following discharge from rehab. Follow-up in [facility] in 8 weeks following discharge from rehab, office to schedule an appointment.  Also follow-up with interventional neuroradiology post stenting  35 minutes were spent preparing discharge.   Pt seen by Neuro NP/APP and later by MD. Note/plan to be edited by MD as needed.    Audrene Lease, DNP, AGACNP-BC [facility] Please use AMION for contact information & EPIC for messaging.  ATTENDING NOTE: I reviewed above note and agree with the assessment and plan. Pt was seen and examined.   No acute event overnight.  Patient still has expressive aphasia, less receptive aphasia, right arm strength continually improved.  Moving both lower extremities well.  Tolerating p.o. intake.  Medically ready for SNF placement.  Continue aspirin  and Brilinta  DAPT and statin.  Follow-up in GI and with interventional neuroradiology.  For detailed assessment and plan, please refer to above as I have made changes wherever appropriate.   Consuelo Denmark, MD PhD Stroke Neurology 06/05/2024 4:02 PM

## 2024-06-05 NOTE — Progress Notes (Signed)
 RN removed PIV per discharge order, site is clean dry and intact, patient belongings in the recliner, and patient made aware of PTAR list. Patient nodded appropriately. Virtual RN provided report to SNF.

## 2024-06-05 NOTE — Progress Notes (Signed)
 STROKE TEAM PROGRESS NOTE   SIGNIFICANT HOSPITAL EVENTS 6/2-patient presented to [facility] with right-sided weakness and aphasia, TNK was administered and patient was transferred here for thrombectomy. TICI 3 revascularization of distal left ICA stent occlusion and MCA achieved, remained intubated after procedure 6/4- Extubated  6/6- transferred out of the ICU  INTERIM HISTORY/SUBJECTIVE  Vitals and exam stable.  Core track removed 6/10. Pending insurance authorization for SNF placement.  Family selected [facility].  OBJECTIVE  CBC    Component Value Date/Time   WBC 11.7 (H) 06/04/2024 0437   RBC 3.69 (L) 06/04/2024 0437   HGB 11.1 (L) 06/04/2024 0437   HCT 33.8 (L) 06/04/2024 0437   PLT 380 06/04/2024 0437   MCV 91.6 06/04/2024 0437   MCH 30.1 06/04/2024 0437   MCHC 32.8 06/04/2024 0437   RDW 13.9 06/04/2024 0437   LYMPHSABS 0.9 05/28/2024 0548   MONOABS 0.6 05/28/2024 0548   EOSABS 0.0 05/28/2024 0548   BASOSABS 0.0 05/28/2024 0548    BMET    Component Value Date/Time   NA 136 06/04/2024 0437   K 3.9 06/04/2024 0437   CL 103 06/04/2024 0437   CO2 24 06/04/2024 0437   GLUCOSE 218 (H) 06/04/2024 0437   BUN 25 (H) 06/04/2024 0437   CREATININE 0.76 06/04/2024 0437   CREATININE 0.94 02/08/2018 1209   CALCIUM  8.9 06/04/2024 0437   GFRNONAA >60 06/04/2024 0437    IMAGING past 24 hours No results found.   Vitals:   06/05/24 0753 06/05/24 0849 06/05/24 1144 06/05/24 1227  BP: (!) 131/52 (!) 126/44 (!) 101/52 (!) 101/52  Pulse: 64 65  65  Resp: 19 16    Temp:      TempSrc:      SpO2: 97% 97%  99%  Weight:         PHYSICAL EXAM General:  Alert, well-nourished, well-developed patient in no acute distress Psych:  Mood and affect appropriate for situation CV: Regular rate and rhythm on monitor Respiratory: Respirations regular and unlabored on room air  Neuro: Awake and alert.  Global aphasia with ability to intermittently mimic very simple  commands such as eyes open close and thumbs up.  She is not able to repeat full sentences. EOMI, tracks examiner.  Decreased blink to threat on right. Mild right facial droop. Tongue protrusion seems midline, on slight protrusion RUE 4+/5.  L UE, BLE 5/5. Ataxia seen in RUE. Unable to assess coordination due to patient's aphasia. Gait: Deferred   ASSESSMENT/PLAN  [name] is a 58 y.o. female with history of smoking, left carotid subclavian bypass graft, right lower lung mass, COPD, chronic pancreatitis, depression, GERD, hyperlipidemia, hypertension and previous stroke admitted for acute onset right-sided weakness and aphasia.  Patient was given TNK at [facility] and was transferred here for thrombectomy.  Thrombectomy was successful with revascularization of left ICA and left M1 segment achieved with 3 passes.  NIH on Admission 18  Stroke: Large left MCA territory infarcts due to left distal ICA stent and MCA occlusion s/p TNK and IR with TICI3 reperfusion Etiology: Large vessel disease Code Stroke CT head No acute abnormality. ASPECTS 10.    CTA head & neck occlusion of the left ICA from proximal cervical segment to ICA terminus, left common carotid to left subclavian bypass noted, moderate stenosis of right supraclinoid ICA, focal moderate stenosis of proximal M2 branch of left MCA, moderate stenosis of A2 segment of left ACA IR with occluded left ICA  at the level of the previously positioned siphon flow diverter stent.  Status post IR with TICI3 MRI - Large acute nonhemorrhagic left MCA territory ischemic infarct. Acute nonhemorrhagic infarct in the posterior left lentiform nucleus. Acute nonhemorrhagic left periventricular white matter infarcts. Remote cortical infarcts in the left parietal lobe and along the watershed distribution over the left hemisphere. Remote inferior cerebellar infarcts, right greater than left. Moderate mucosal disease in the sphenoid sinus  bilaterally and posterior maxillary sinuses. Mild mucosal thickening in the anterior ethmoid air cells and inferior frontal sinuses. 2D Echo EF 65 to 70%, grade 1 diastolic dysfunction, no interatrial shunt, normal left atrial size LDL 97 HgbA1c 6.3 UDS positive for cocaine and THC VTE prophylaxis -Lovenox  aspirin  81 mg daily and Brilinta  (ticagrelor ) 90 mg bid prior to admission, continue aspirin  and Brilinta   Therapy recommendations:  SNF Disposition: Pending  Hx of Stroke/TIA 12/2018 admitted for left occipital and MCA/ACA infarct.  MRA head and neck showed left VA occlusion, right M1 moderate stenosis, left A1 moderate stenosis.  EF 60 to 65%.  No DVT.  LDL 83, A1c 5.7, UDS positive for THC.  Discharged on DAPT and Lipitor  80 08/2022 admitted for left frontal and parietal watershed infarcts.  CTA head and neck left VA chronic occlusion, right V3 occlusion.  Moderate to severe left siphon and moderate right siphon stenosis.  Status post left ICA stent stenting.  Hypertension Home meds: None Stable now Monitor BP goal normotensive  Hyperlipidemia Home meds: Atorvastatin  80 mg daily and ezetimibe  10 mg daily LDL- 97, goal less than 70 Continue Lipitor  80 and Zetia  10 Continue statin and Zetia  at discharge  Tobacco Abuse Patient smokes 0.5 packs per day for 41 years Will assess readiness to quit when patient aphasia improves  Substance Abuse UDS positive for  THC and cocaine Will assess readiness to quit when patient aphasia improves TOC consult for cessation placed  Dysphagia Patient has post-stroke dysphagia, SLP consulted  now on dysphagia 1 nectar thick liquid  CorTrak removed 6/10 die to patient's improved PO intake Advance diet as tolerated  Other Stroke Risk Factors Obesity, Body mass index is 34.05 kg/m., BMI >/= 30 associated with increased stroke risk, recommend weight loss, diet and exercise as appropriate  PAD, history of left CCA to subclavian artery bypass in  2019  Other Active Problems History of right lower lobe mass  Hospital day # 9   Pt seen by Neuro NP/APP and later by MD. Note/plan to be edited by MD as needed.    Rita Lease, DNP, AGACNP-BC [facility] Please use AMION for contact information & EPIC for messaging.      To contact Stroke Continuity provider, please refer to [email or web address]. After hours, contact General Neurology

## 2024-06-05 NOTE — TOC Transition Note (Signed)
 Transition of Care [facility]) - Discharge Note   Patient Details  Name: Rita [name] MRN: [id number] Date of Birth: 07-Jun-1966  Transition of Care [facility]) CM/SW Contact:  Tandy Fam, LCSW Phone Number: 06/05/2024, 2:35 PM   Clinical Narrative:   Patient received insurance authorization to admit to SNF, bed is available at [facility]. CSW met with patient, updated daughter via phone, both in agreement. CSW sent discharge information to [facility] and confirmed receipt. Transport arranged with [facility].  Nurse to call report to [phone], Room C12-1    Final next level of care: Skilled Nursing Facility Barriers to Discharge: Barriers Resolved   Patient Goals and CMS Choice Patient states their goals for this hospitalization and ongoing recovery are:: patient currently intubated/ sedated          Discharge Placement              Patient chooses bed at:  [facility]) Patient to be transferred to facility by: [facility] Name of family member notified: Nikki Patient and family notified of of transfer: 06/05/24  Discharge Plan and Services Additional resources added to the After Visit Summary for   In-house Referral: Clinical Social Work Discharge Planning Services: CM Consult Post Acute Care Choice: Skilled Nursing Facility                               Social Drivers of Health (SDOH) Interventions SDOH Screenings   Food Insecurity: Patient Unable To Answer (05/28/2024)  Housing: Patient Unable To Answer (05/28/2024)  Transportation Needs: Patient Unable To Answer (05/28/2024)  Utilities: Patient Unable To Answer (05/28/2024)  Tobacco Use: High Risk (05/27/2024)     Readmission Risk Interventions     No data to display

## 2024-06-05 NOTE — Plan of Care (Signed)
  Problem: Clinical Measurements: Goal: Diagnostic test results will improve Outcome: Progressing Goal: Respiratory complications will improve Outcome: Progressing   Problem: Activity: Goal: Risk for activity intolerance will decrease Outcome: Progressing   

## 2024-06-13 ENCOUNTER — Emergency Department (HOSPITAL_COMMUNITY)

## 2024-06-13 ENCOUNTER — Other Ambulatory Visit: Payer: Self-pay

## 2024-06-13 ENCOUNTER — Inpatient Hospital Stay (HOSPITAL_COMMUNITY)
Admission: EM | Admit: 2024-06-13 | Discharge: 2024-06-16 | DRG: 637 | Disposition: A | Discharge status: Home Health | Source: Skilled Nursing Facility | Attending: Family Medicine | Admitting: Family Medicine

## 2024-06-13 ENCOUNTER — Encounter (HOSPITAL_COMMUNITY): Payer: Self-pay

## 2024-06-13 DIAGNOSIS — R490 Dysphonia: Secondary | ICD-10-CM | POA: Diagnosis present

## 2024-06-13 DIAGNOSIS — Z66 Do not resuscitate: Secondary | ICD-10-CM | POA: Diagnosis present

## 2024-06-13 DIAGNOSIS — R933 Abnormal findings on diagnostic imaging of other parts of digestive tract: Secondary | ICD-10-CM | POA: Diagnosis not present

## 2024-06-13 DIAGNOSIS — E782 Mixed hyperlipidemia: Secondary | ICD-10-CM | POA: Diagnosis present

## 2024-06-13 DIAGNOSIS — E876 Hypokalemia: Secondary | ICD-10-CM | POA: Diagnosis present

## 2024-06-13 DIAGNOSIS — F17210 Nicotine dependence, cigarettes, uncomplicated: Secondary | ICD-10-CM | POA: Diagnosis present

## 2024-06-13 DIAGNOSIS — T45521A Poisoning by antithrombotic drugs, accidental (unintentional), initial encounter: Secondary | ICD-10-CM | POA: Diagnosis present

## 2024-06-13 DIAGNOSIS — Z823 Family history of stroke: Secondary | ICD-10-CM

## 2024-06-13 DIAGNOSIS — E8881 Metabolic syndrome: Secondary | ICD-10-CM | POA: Diagnosis present

## 2024-06-13 DIAGNOSIS — J449 Chronic obstructive pulmonary disease, unspecified: Secondary | ICD-10-CM | POA: Diagnosis present

## 2024-06-13 DIAGNOSIS — K922 Gastrointestinal hemorrhage, unspecified: Secondary | ICD-10-CM | POA: Diagnosis not present

## 2024-06-13 DIAGNOSIS — G9341 Metabolic encephalopathy: Secondary | ICD-10-CM | POA: Diagnosis present

## 2024-06-13 DIAGNOSIS — Z6834 Body mass index (BMI) 34.0-34.9, adult: Secondary | ICD-10-CM

## 2024-06-13 DIAGNOSIS — Z8711 Personal history of peptic ulcer disease: Secondary | ICD-10-CM

## 2024-06-13 DIAGNOSIS — I10 Essential (primary) hypertension: Secondary | ICD-10-CM | POA: Diagnosis present

## 2024-06-13 DIAGNOSIS — F1120 Opioid dependence, uncomplicated: Secondary | ICD-10-CM | POA: Diagnosis present

## 2024-06-13 DIAGNOSIS — E860 Dehydration: Principal | ICD-10-CM | POA: Diagnosis present

## 2024-06-13 DIAGNOSIS — K219 Gastro-esophageal reflux disease without esophagitis: Secondary | ICD-10-CM | POA: Diagnosis present

## 2024-06-13 DIAGNOSIS — Z800 Family history of malignant neoplasm of digestive organs: Secondary | ICD-10-CM

## 2024-06-13 DIAGNOSIS — Z794 Long term (current) use of insulin: Secondary | ICD-10-CM

## 2024-06-13 DIAGNOSIS — R6520 Severe sepsis without septic shock: Secondary | ICD-10-CM | POA: Diagnosis not present

## 2024-06-13 DIAGNOSIS — D649 Anemia, unspecified: Secondary | ICD-10-CM | POA: Diagnosis present

## 2024-06-13 DIAGNOSIS — Z634 Disappearance and death of family member: Secondary | ICD-10-CM

## 2024-06-13 DIAGNOSIS — K921 Melena: Secondary | ICD-10-CM | POA: Diagnosis present

## 2024-06-13 DIAGNOSIS — G9340 Encephalopathy, unspecified: Secondary | ICD-10-CM | POA: Diagnosis present

## 2024-06-13 DIAGNOSIS — E872 Acidosis, unspecified: Secondary | ICD-10-CM | POA: Diagnosis present

## 2024-06-13 DIAGNOSIS — Z7951 Long term (current) use of inhaled steroids: Secondary | ICD-10-CM

## 2024-06-13 DIAGNOSIS — N179 Acute kidney failure, unspecified: Secondary | ICD-10-CM | POA: Diagnosis present

## 2024-06-13 DIAGNOSIS — R918 Other nonspecific abnormal finding of lung field: Secondary | ICD-10-CM | POA: Diagnosis present

## 2024-06-13 DIAGNOSIS — R4701 Aphasia: Secondary | ICD-10-CM | POA: Diagnosis present

## 2024-06-13 DIAGNOSIS — Z8249 Family history of ischemic heart disease and other diseases of the circulatory system: Secondary | ICD-10-CM

## 2024-06-13 DIAGNOSIS — K920 Hematemesis: Secondary | ICD-10-CM | POA: Diagnosis present

## 2024-06-13 DIAGNOSIS — E1110 Type 2 diabetes mellitus with ketoacidosis without coma: Secondary | ICD-10-CM

## 2024-06-13 DIAGNOSIS — Z825 Family history of asthma and other chronic lower respiratory diseases: Secondary | ICD-10-CM

## 2024-06-13 DIAGNOSIS — I69351 Hemiplegia and hemiparesis following cerebral infarction affecting right dominant side: Secondary | ICD-10-CM

## 2024-06-13 DIAGNOSIS — E1100 Type 2 diabetes mellitus with hyperosmolarity without nonketotic hyperglycemic-hyperosmolar coma (NKHHC): Principal | ICD-10-CM | POA: Diagnosis present

## 2024-06-13 DIAGNOSIS — I69391 Dysphagia following cerebral infarction: Secondary | ICD-10-CM

## 2024-06-13 DIAGNOSIS — J690 Pneumonitis due to inhalation of food and vomit: Secondary | ICD-10-CM | POA: Diagnosis present

## 2024-06-13 DIAGNOSIS — I639 Cerebral infarction, unspecified: Secondary | ICD-10-CM

## 2024-06-13 DIAGNOSIS — I959 Hypotension, unspecified: Secondary | ICD-10-CM | POA: Diagnosis present

## 2024-06-13 DIAGNOSIS — R710 Precipitous drop in hematocrit: Secondary | ICD-10-CM | POA: Diagnosis not present

## 2024-06-13 DIAGNOSIS — K529 Noninfective gastroenteritis and colitis, unspecified: Secondary | ICD-10-CM | POA: Diagnosis present

## 2024-06-13 DIAGNOSIS — Z7984 Long term (current) use of oral hypoglycemic drugs: Secondary | ICD-10-CM

## 2024-06-13 DIAGNOSIS — Z860100 Personal history of colon polyps, unspecified: Secondary | ICD-10-CM

## 2024-06-13 DIAGNOSIS — Z7902 Long term (current) use of antithrombotics/antiplatelets: Secondary | ICD-10-CM

## 2024-06-13 DIAGNOSIS — A419 Sepsis, unspecified organism: Secondary | ICD-10-CM | POA: Diagnosis present

## 2024-06-13 DIAGNOSIS — Z833 Family history of diabetes mellitus: Secondary | ICD-10-CM

## 2024-06-13 DIAGNOSIS — D75839 Thrombocytosis, unspecified: Secondary | ICD-10-CM | POA: Diagnosis present

## 2024-06-13 DIAGNOSIS — K2270 Barrett's esophagus without dysplasia: Secondary | ICD-10-CM | POA: Diagnosis present

## 2024-06-13 DIAGNOSIS — E66811 Obesity, class 1: Secondary | ICD-10-CM | POA: Diagnosis present

## 2024-06-13 DIAGNOSIS — R7989 Other specified abnormal findings of blood chemistry: Secondary | ICD-10-CM

## 2024-06-13 DIAGNOSIS — R1310 Dysphagia, unspecified: Secondary | ICD-10-CM | POA: Diagnosis present

## 2024-06-13 DIAGNOSIS — R29810 Facial weakness: Secondary | ICD-10-CM | POA: Diagnosis present

## 2024-06-13 DIAGNOSIS — Z79899 Other long term (current) drug therapy: Secondary | ICD-10-CM

## 2024-06-13 DIAGNOSIS — E861 Hypovolemia: Secondary | ICD-10-CM | POA: Diagnosis present

## 2024-06-13 DIAGNOSIS — I69392 Facial weakness following cerebral infarction: Secondary | ICD-10-CM

## 2024-06-13 DIAGNOSIS — Z7982 Long term (current) use of aspirin: Secondary | ICD-10-CM

## 2024-06-13 DIAGNOSIS — R4182 Altered mental status, unspecified: Secondary | ICD-10-CM

## 2024-06-13 DIAGNOSIS — I69320 Aphasia following cerebral infarction: Secondary | ICD-10-CM

## 2024-06-13 DIAGNOSIS — R739 Hyperglycemia, unspecified: Secondary | ICD-10-CM

## 2024-06-13 DIAGNOSIS — G8929 Other chronic pain: Secondary | ICD-10-CM | POA: Diagnosis present

## 2024-06-13 HISTORY — DX: Personal history of other diseases of the digestive system: Z87.19

## 2024-06-13 LAB — BASIC METABOLIC PANEL WITH GFR
Anion gap: 11 (ref 5–15)
Anion gap: 13 (ref 5–15)
BUN: 90 mg/dL — ABNORMAL HIGH (ref 6–20)
BUN: 94 mg/dL — ABNORMAL HIGH (ref 6–20)
CO2: 16 mmol/L — ABNORMAL LOW (ref 22–32)
CO2: 19 mmol/L — ABNORMAL LOW (ref 22–32)
Calcium: 7.8 mg/dL — ABNORMAL LOW (ref 8.9–10.3)
Calcium: 8 mg/dL — ABNORMAL LOW (ref 8.9–10.3)
Chloride: 110 mmol/L (ref 98–111)
Chloride: 105 mmol/L (ref 98–111)
Creatinine, Ser: 1.96 mg/dL — ABNORMAL HIGH (ref 0.44–1.00)
Creatinine, Ser: 1.75 mg/dL — ABNORMAL HIGH (ref 0.44–1.00)
GFR, Estimated: 29 mL/min — ABNORMAL LOW (ref >60)
GFR, Estimated: 33 mL/min — ABNORMAL LOW (ref >60)
Glucose, Bld: 142 mg/dL — ABNORMAL HIGH (ref 70–99)
Glucose, Bld: 198 mg/dL — ABNORMAL HIGH (ref 70–99)
Potassium: 3.2 mmol/L — ABNORMAL LOW (ref 3.5–5.1)
Potassium: 3.2 mmol/L — ABNORMAL LOW (ref 3.5–5.1)
Sodium: 137 mmol/L (ref 135–145)
Sodium: 137 mmol/L (ref 135–145)

## 2024-06-13 LAB — CBC WITH DIFFERENTIAL/PLATELET
Abs Immature Granulocytes: 0.63 10*3/uL — ABNORMAL HIGH (ref 0.00–0.07)
Basophils Absolute: 0.1 10*3/uL (ref 0.0–0.1)
Basophils Relative: 0 %
Eosinophils Absolute: 0 10*3/uL (ref 0.0–0.5)
Eosinophils Relative: 0 %
HCT: 39.3 % (ref 36.0–46.0)
Hemoglobin: 13.1 g/dL (ref 12.0–15.0)
Immature Granulocytes: 2 %
Lymphocytes Relative: 3 %
Lymphs Abs: 1.2 10*3/uL (ref 0.7–4.0)
MCH: 29.6 pg (ref 26.0–34.0)
MCHC: 33.3 g/dL (ref 30.0–36.0)
MCV: 88.9 fL (ref 80.0–100.0)
Monocytes Absolute: 1.3 10*3/uL — ABNORMAL HIGH (ref 0.1–1.0)
Monocytes Relative: 4 %
Neutro Abs: 31.9 10*3/uL — ABNORMAL HIGH (ref 1.7–7.7)
Neutrophils Relative %: 91 %
Platelets: 944 10*3/uL (ref 150–400)
RBC: 4.42 MIL/uL (ref 3.87–5.11)
RDW: 13.7 % (ref 11.5–15.5)
Smear Review: INCREASED
WBC: 35 10*3/uL — ABNORMAL HIGH (ref 4.0–10.5)
nRBC: 0 % (ref 0.0–0.2)

## 2024-06-13 LAB — GLUCOSE, CAPILLARY
Glucose-Capillary: 152 mg/dL — ABNORMAL HIGH (ref 70–99)
Glucose-Capillary: 219 mg/dL — ABNORMAL HIGH (ref 70–99)
Glucose-Capillary: 195 mg/dL — ABNORMAL HIGH (ref 70–99)
Glucose-Capillary: 151 mg/dL — ABNORMAL HIGH (ref 70–99)

## 2024-06-13 LAB — URINALYSIS, ROUTINE W REFLEX MICROSCOPIC
Bilirubin Urine: NEGATIVE
Glucose, UA: 50 mg/dL — AB
Hgb urine dipstick: NEGATIVE
Ketones, ur: NEGATIVE mg/dL
Nitrite: NEGATIVE
Protein, ur: 100 mg/dL — AB
Specific Gravity, Urine: 1.015 (ref 1.005–1.030)
WBC, UA: >50 WBC/hpf (ref 0–5)
pH: 5 (ref 5.0–8.0)

## 2024-06-13 LAB — COMPREHENSIVE METABOLIC PANEL WITH GFR
ALT: 39 U/L (ref 0–44)
AST: 31 U/L (ref 15–41)
Albumin: 3.1 g/dL — ABNORMAL LOW (ref 3.5–5.0)
Alkaline Phosphatase: 78 U/L (ref 38–126)
Anion gap: 22 — ABNORMAL HIGH (ref 5–15)
BUN: 76 mg/dL — ABNORMAL HIGH (ref 6–20)
CO2: 13 mmol/L — ABNORMAL LOW (ref 22–32)
Calcium: 8.3 mg/dL — ABNORMAL LOW (ref 8.9–10.3)
Chloride: 97 mmol/L — ABNORMAL LOW (ref 98–111)
Creatinine, Ser: 2.65 mg/dL — ABNORMAL HIGH (ref 0.44–1.00)
GFR, Estimated: 20 mL/min — ABNORMAL LOW (ref >60)
Glucose, Bld: 515 mg/dL (ref 70–99)
Potassium: 3 mmol/L — ABNORMAL LOW (ref 3.5–5.1)
Sodium: 132 mmol/L — ABNORMAL LOW (ref 135–145)
Total Bilirubin: 0.6 mg/dL (ref 0.0–1.2)
Total Protein: 6.3 g/dL — ABNORMAL LOW (ref 6.5–8.1)

## 2024-06-13 LAB — IRON AND TIBC
Iron: 73 ug/dL (ref 28–170)
Saturation Ratios: 30 % (ref 10.4–31.8)
TIBC: 245 ug/dL — ABNORMAL LOW (ref 250–450)
UIBC: 172 ug/dL

## 2024-06-13 LAB — BLOOD GAS, VENOUS
Acid-base deficit: 10.4 mmol/L — ABNORMAL HIGH (ref 0.0–2.0)
Bicarbonate: 13 mmol/L — ABNORMAL LOW (ref 20.0–28.0)
Drawn by: 53361
O2 Saturation: 98.6 %
Patient temperature: 36.2
pCO2, Ven: 22 mmHg — ABNORMAL LOW (ref 44–60)
pH, Ven: 7.37 (ref 7.25–7.43)
pO2, Ven: 105 mmHg — ABNORMAL HIGH (ref 32–45)

## 2024-06-13 LAB — CBG MONITORING, ED
Glucose-Capillary: 528 mg/dL (ref 70–99)
Glucose-Capillary: 240 mg/dL — ABNORMAL HIGH (ref 70–99)
Glucose-Capillary: 163 mg/dL — ABNORMAL HIGH (ref 70–99)

## 2024-06-13 LAB — BETA-HYDROXYBUTYRIC ACID
Beta-Hydroxybutyric Acid: 0.44 mmol/L — ABNORMAL HIGH (ref 0.05–0.27)
Beta-Hydroxybutyric Acid: 0.28 mmol/L — ABNORMAL HIGH (ref 0.05–0.27)

## 2024-06-13 LAB — VITAMIN D 25 HYDROXY (VIT D DEFICIENCY, FRACTURES): Vit D, 25-Hydroxy: 40.46 ng/mL (ref 30–100)

## 2024-06-13 LAB — LACTIC ACID, PLASMA
Lactic Acid, Venous: 6.9 mmol/L (ref 0.5–1.9)
Lactic Acid, Venous: 4.1 mmol/L (ref 0.5–1.9)
Lactic Acid, Venous: 1.6 mmol/L (ref 0.5–1.9)

## 2024-06-13 LAB — RAPID URINE DRUG SCREEN, HOSP PERFORMED
Amphetamines: NOT DETECTED
Barbiturates: NOT DETECTED
Benzodiazepines: NOT DETECTED
Cocaine: NOT DETECTED
Opiates: NOT DETECTED
Tetrahydrocannabinol: NOT DETECTED

## 2024-06-13 LAB — TRANSFERRIN: Transferrin: 175 mg/dL — ABNORMAL LOW (ref 192–382)

## 2024-06-13 LAB — MRSA NEXT GEN BY PCR, NASAL: MRSA by PCR Next Gen: NOT DETECTED

## 2024-06-13 LAB — MAGNESIUM: Magnesium: 1 mg/dL — ABNORMAL LOW (ref 1.7–2.4)

## 2024-06-13 LAB — TROPONIN I (HIGH SENSITIVITY)
Troponin I (High Sensitivity): 65 ng/L — ABNORMAL HIGH (ref <18)
Troponin I (High Sensitivity): 122 ng/L (ref <18)

## 2024-06-13 LAB — LIPASE, BLOOD: Lipase: 40 U/L (ref 11–51)

## 2024-06-13 LAB — OSMOLALITY: Osmolality: 331 mosm/kg (ref 275–295)

## 2024-06-13 LAB — PHOSPHORUS: Phosphorus: <1 mg/dL — CL (ref 2.5–4.6)

## 2024-06-13 LAB — FERRITIN: Ferritin: 50 ng/mL (ref 11–307)

## 2024-06-13 MED ORDER — SODIUM CHLORIDE 0.9% FLUSH
3.0000 mL | Freq: Two times a day (BID) | INTRAVENOUS | Status: DC
  Administered 2024-06-13 – 2024-06-16 (×7): 3 mL via INTRAVENOUS

## 2024-06-13 MED ORDER — MAGNESIUM SULFATE 4 GM/100ML IV SOLN
4.0000 g | Freq: Once | INTRAVENOUS | Status: AC
  Administered 2024-06-13: 4 g via INTRAVENOUS
  Filled 2024-06-13: qty 100

## 2024-06-13 MED ORDER — TICAGRELOR 90 MG PO TABS: 90.0000 mg | ORAL_TABLET | Freq: Two times a day (BID) | ORAL | Status: DC

## 2024-06-13 MED ORDER — NICOTINE 14 MG/24HR TD PT24
14.0000 mg | MEDICATED_PATCH | Freq: Every day | TRANSDERMAL | Status: DC
  Administered 2024-06-14 – 2024-06-16 (×3): 14 mg via TRANSDERMAL
  Filled 2024-06-13 (×4): qty 1

## 2024-06-13 MED ORDER — INSULIN ASPART 100 UNIT/ML IV SOLN: 10.0000 [IU] | Freq: Once | INTRAVENOUS | Status: AC

## 2024-06-13 MED ORDER — POTASSIUM CHLORIDE 10 MEQ/100ML IV SOLN
10.0000 meq | INTRAVENOUS | Status: AC
  Administered 2024-06-13 (×3): 10 meq via INTRAVENOUS
  Filled 2024-06-13 (×3): qty 100

## 2024-06-13 MED ORDER — ATORVASTATIN CALCIUM 40 MG PO TABS
80.0000 mg | ORAL_TABLET | Freq: Every day | ORAL | Status: DC
  Administered 2024-06-13 – 2024-06-15 (×3): 80 mg via ORAL
  Filled 2024-06-13 (×3): qty 2

## 2024-06-13 MED ORDER — ONDANSETRON HCL 4 MG/2ML IJ SOLN
4.0000 mg | Freq: Once | INTRAMUSCULAR | Status: AC
  Filled 2024-06-13: qty 2

## 2024-06-13 MED ORDER — ARFORMOTEROL TARTRATE 15 MCG/2ML IN NEBU
15.0000 ug | INHALATION_SOLUTION | Freq: Two times a day (BID) | RESPIRATORY_TRACT | Status: DC
  Administered 2024-06-13: 15 ug via RESPIRATORY_TRACT
  Filled 2024-06-13 (×3): qty 2

## 2024-06-13 MED ORDER — LACTATED RINGERS IV SOLN: INTRAVENOUS | Status: DC

## 2024-06-13 MED ORDER — ARFORMOTEROL TARTRATE 15 MCG/2ML IN NEBU
15.0000 ug | INHALATION_SOLUTION | Freq: Two times a day (BID) | RESPIRATORY_TRACT | Status: DC
  Administered 2024-06-14 – 2024-06-16 (×5): 15 ug via RESPIRATORY_TRACT
  Filled 2024-06-13 (×5): qty 2

## 2024-06-13 MED ORDER — BUPRENORPHINE HCL-NALOXONE HCL 8-2 MG SL SUBL
1.0000 | SUBLINGUAL_TABLET | Freq: Three times a day (TID) | SUBLINGUAL | Status: DC
  Administered 2024-06-13 – 2024-06-16 (×9): 1 via SUBLINGUAL
  Filled 2024-06-13 (×9): qty 1

## 2024-06-13 MED ORDER — LACTATED RINGERS IV BOLUS
20.0000 mL/kg | Freq: Once | INTRAVENOUS | Status: AC
  Administered 2024-06-13: 1972 mL via INTRAVENOUS

## 2024-06-13 MED ORDER — BUDESONIDE 0.5 MG/2ML IN SUSP
0.5000 mg | Freq: Two times a day (BID) | RESPIRATORY_TRACT | Status: DC
  Administered 2024-06-14 – 2024-06-16 (×5): 0.5 mg via RESPIRATORY_TRACT
  Filled 2024-06-13 (×5): qty 2

## 2024-06-13 MED ORDER — VANCOMYCIN HCL 2000 MG/400ML IV SOLN: 2000.0000 mg | Freq: Once | INTRAVENOUS | Status: DC

## 2024-06-13 MED ORDER — DEXTROSE 50 % IV SOLN: 0.0000 mL | INTRAVENOUS | Status: DC | PRN

## 2024-06-13 MED ORDER — CHLORHEXIDINE GLUCONATE CLOTH 2 % EX PADS
6.0000 | MEDICATED_PAD | Freq: Every day | CUTANEOUS | Status: DC
  Administered 2024-06-13 – 2024-06-15 (×3): 6 via TOPICAL

## 2024-06-13 MED ORDER — IPRATROPIUM-ALBUTEROL 0.5-2.5 (3) MG/3ML IN SOLN
3.0000 mL | Freq: Four times a day (QID) | RESPIRATORY_TRACT | Status: DC | PRN
  Administered 2024-06-14 (×2): 3 mL via RESPIRATORY_TRACT
  Filled 2024-06-13 (×4): qty 3

## 2024-06-13 MED ORDER — LACTATED RINGERS IV BOLUS: 1000.0000 mL | Freq: Once | INTRAVENOUS | Status: AC

## 2024-06-13 MED ORDER — INSULIN ASPART 100 UNIT/ML IJ SOLN
0.0000 [IU] | INTRAMUSCULAR | Status: DC
  Administered 2024-06-13 – 2024-06-14 (×6): 2 [IU] via SUBCUTANEOUS
  Administered 2024-06-14 – 2024-06-15 (×3): 1 [IU] via SUBCUTANEOUS

## 2024-06-13 MED ORDER — METRONIDAZOLE 500 MG/100ML IV SOLN
500.0000 mg | Freq: Two times a day (BID) | INTRAVENOUS | Status: AC
  Administered 2024-06-13: 500 mg via INTRAVENOUS
  Filled 2024-06-13: qty 100

## 2024-06-13 MED ORDER — ASPIRIN 81 MG PO TBEC
81.0000 mg | DELAYED_RELEASE_TABLET | Freq: Every day | ORAL | Status: DC
  Administered 2024-06-14 – 2024-06-16 (×3): 81 mg via ORAL
  Filled 2024-06-13 (×4): qty 1

## 2024-06-13 MED ORDER — PANTOPRAZOLE SODIUM 40 MG IV SOLR
40.0000 mg | Freq: Two times a day (BID) | INTRAVENOUS | Status: DC
  Administered 2024-06-13 – 2024-06-16 (×7): 40 mg via INTRAVENOUS
  Filled 2024-06-13 (×7): qty 10

## 2024-06-13 MED ORDER — SODIUM CHLORIDE 0.9 % IV SOLN
2.0000 g | INTRAVENOUS | Status: AC
  Administered 2024-06-14: 2 g via INTRAVENOUS
  Filled 2024-06-13: qty 20

## 2024-06-13 MED ORDER — TICAGRELOR 90 MG PO TABS
90.0000 mg | ORAL_TABLET | Freq: Two times a day (BID) | ORAL | Status: DC
  Administered 2024-06-14 – 2024-06-16 (×5): 90 mg via ORAL
  Filled 2024-06-13 (×6): qty 1

## 2024-06-13 MED ORDER — ASPIRIN 81 MG PO CHEW: 81.0000 mg | CHEWABLE_TABLET | Freq: Every day | ORAL | Status: DC

## 2024-06-13 MED ORDER — INSULIN REGULAR(HUMAN) IN NACL 100-0.9 UT/100ML-% IV SOLN
INTRAVENOUS | Status: DC
  Administered 2024-06-13: 7.5 [IU]/h via INTRAVENOUS

## 2024-06-13 MED ORDER — SODIUM CHLORIDE 0.9 % IV SOLN
2.0000 g | Freq: Once | INTRAVENOUS | Status: AC
  Filled 2024-06-13: qty 20

## 2024-06-13 MED ORDER — BUDESONIDE 0.5 MG/2ML IN SUSP
0.5000 mg | Freq: Two times a day (BID) | RESPIRATORY_TRACT | Status: DC
  Filled 2024-06-13 (×3): qty 2

## 2024-06-13 MED ORDER — METRONIDAZOLE 500 MG/100ML IV SOLN
500.0000 mg | Freq: Once | INTRAVENOUS | Status: AC
  Administered 2024-06-13: 500 mg via INTRAVENOUS
  Filled 2024-06-13: qty 100

## 2024-06-13 MED ORDER — POTASSIUM PHOSPHATES 15 MMOLE/5ML IV SOLN
30.0000 mmol | Freq: Once | INTRAVENOUS | Status: AC
  Filled 2024-06-13: qty 10

## 2024-06-13 MED ORDER — DEXTROSE IN LACTATED RINGERS 5 % IV SOLN: INTRAVENOUS | Status: DC

## 2024-06-13 MED ORDER — STERILE WATER FOR INJECTION IV SOLN
INTRAVENOUS | Status: DC
  Filled 2024-06-13 (×4): qty 1000

## 2024-06-13 MED ORDER — PANTOPRAZOLE SODIUM 40 MG IV SOLR
40.0000 mg | Freq: Once | INTRAVENOUS | Status: AC
  Filled 2024-06-13: qty 10

## 2024-06-13 MED ORDER — EZETIMIBE 10 MG PO TABS
10.0000 mg | ORAL_TABLET | Freq: Every day | ORAL | Status: DC
  Administered 2024-06-14 – 2024-06-16 (×3): 10 mg via ORAL
  Filled 2024-06-13 (×3): qty 1

## 2024-06-13 NOTE — ED Notes (Signed)
240 CBG

## 2024-06-13 NOTE — Hospital Course (Signed)
 58 year old female recently discharged from [facility] on 06/05/2024 after being treated for large left MCA territory infarcts due to left distal ICA stent and MCA occlusion status post TNK and IR with TICI3 reperfusion, prior history of stroke, hypertension, hyperlipidemia, cocaine and marijuana abuse, tobacco abuse, dysphagia, PAD, obesity, history of right lower lobe lung mass had initially been recommended to go to [facility] however she ended up going to SNF at [facility].  She was supposed to be on aspirin  and Brilinta , Lipitor  and Zetia .  She presented to ED by EMS from [facility] about 10 minutes after they found her on the toilet with what appeared to be dried blood around the mouth and blood in the toilet.  She apparently had some melanotic stool.  Upon review of the MAR from the facility and discussing with our Pharm.D. patient was actually receiving Plavix  75 mg twice daily in addition to aspirin  81 mg daily.  She was severely dehydrated with a blood glucose of 528.  Her lactic acid was severely elevated at 6.9.  Her WBC was 35.0.  She had an AKI with a creatinine of 2.65.  She was hypokalemic with electrolyte abnormalities.  She was briefly placed on an IV insulin  infusion in the ED.  Patient was given IV fluid boluses and empirically given some IV antibiotics and admission was requested.

## 2024-06-13 NOTE — ED Triage Notes (Addendum)
 Pt BIB EMS from [facility] for vomiting blood. EMS stated, ago pt was found on toilet with what appears to be dried blood. [facility] stated she was fine at 9pm. Pt is drowsy and only responding to name being called at this time.   EMS: 96% on RA, 112/74, 130 HR, 480CBG, and 97.1 temp.   Pt currently has black stains around mouth. Dark feces is seen under pt.

## 2024-06-13 NOTE — H&P (Addendum)
 History and Physical    Rita [name]:[id number] DOB: [birth year] DOA: 06/13/2024  PCP: [facility]   Patient coming from: [place] [facility]   Chief Complaint:  Chief Complaint  Patient presents with   Hematemesis    HPI: History limited due to patients global aphasia  Rita [name] is a 58 y.o. female with hx of recent history of admission 6/2 - 6/11 with L MCA infarct secondary to left distal ICA stent thrombosis (initial placement 9/'23) and M1 occlusion s/p TNK and Neuro IR thrombectomy, c/b R hemiparesis, global aphasia, dysphagia; additional history including, HTN, HLD, substance use disorder (cocaine), smoking, who was brought in by EMS from SNF after found with decreased responsiveness on the toilet, with what appeared to be melena and dark material around mouth, possible coffee ground emesis.   On my evaluation she is now awake and alert, but history limited due to aphasia. She is able to answer one word sentences, answers no to all questions, even to questions about vomiting / witnessed events from earlier. She does not express any complaints    Review of Systems:  ROS limited due to aphasia   No Known Allergies  Prior to Admission medications   Medication Sig Start Date End Date Taking? Authorizing Provider  amLODipine  (NORVASC ) 5 MG tablet Take 1 tablet (5 mg total) by mouth daily. 06/06/24   Lehner, Erin C, NP  arformoterol  (BROVANA ) 15 MCG/2ML NEBU Take 2 mLs (15 mcg total) by nebulization 2 (two) times daily. 06/05/24   Lehner, Erin C, NP  aspirin  81 MG chewable tablet Chew 1 tablet (81 mg total) by mouth daily. 06/06/24   Lehner, Erin C, NP  atorvastatin  (LIPITOR ) 80 MG tablet Take 1 tablet (80 mg total) by mouth daily at 6 PM. Patient taking differently: Take 80 mg by mouth at bedtime. 04/01/23 05/28/24  Mason Sole, Pratik D, DO  budesonide  (PULMICORT ) 0.5 MG/2ML nebulizer solution Take 2 mLs (0.5 mg total) by nebulization 2 (two) times daily. 06/05/24    Lehner, Erin C, NP  Buprenorphine  HCl-Naloxone  HCl 8-2 MG FILM Take 1 Film by mouth 3 (three) times daily. 04/01/23   Mason Sole, Pratik D, DO  docusate sodium  (COLACE) 100 MG capsule Take 1 capsule (100 mg total) by mouth 2 (two) times daily as needed for mild constipation. 06/05/24   Audrene Lease, NP  ezetimibe  (ZETIA ) 10 MG tablet Take 1 tablet (10 mg total) by mouth daily. 04/01/23 05/28/24  Mason Sole, Pratik D, DO  insulin  aspart (NOVOLOG ) 100 UNIT/ML injection Inject 0-9 Units into the skin every 4 (four) hours. 06/05/24   Lehner, Erin C, NP  ipratropium-albuterol  (DUONEB) 0.5-2.5 (3) MG/3ML SOLN Take 3 mLs by nebulization every 4 (four) hours as needed. 06/05/24   Audrene Lease, NP  nicotine  (NICODERM CQ  - DOSED IN MG/24 HOURS) 14 mg/24hr patch Place 1 patch (14 mg total) onto the skin daily. 06/06/24   Lehner, Erin C, NP  pantoprazole  (PROTONIX ) 40 MG tablet Take 1 tablet (40 mg total) by mouth daily. 06/06/24   Lehner, Erin C, NP  polyethylene glycol (MIRALAX  / GLYCOLAX ) 17 g packet Take 17 g by mouth daily as needed for moderate constipation. 06/05/24   Lehner, Erin C, NP  ticagrelor  (BRILINTA ) 90 MG TABS tablet Take 1 tablet (90 mg total) by mouth 2 (two) times daily. 06/05/24   Audrene Lease, NP    Past Medical History:  Diagnosis Date   Arthritis    qwhere (02/22/2018)  Barrett's esophagus with esophagitis 03/26/2013   Chronic abdominal pain    Chronic lower back pain    Chronic pancreatitis (HCC)    COPD (chronic obstructive pulmonary disease) (HCC)    Depression    Dyspnea    GERD (gastroesophageal reflux disease)    Heart murmur    Hiatal hernia    High cholesterol    Hypertension    Peptic ulcer    Pneumonia 2000s X 1   Stroke (HCC)    Tobacco use 03/26/2013    Past Surgical History:  Procedure Laterality Date   AORTIC ARCH ANGIOGRAPHY N/A 01/19/2018   Procedure: AORTIC ARCH ANGIOGRAPHY;  Surgeon: Richrd Char, MD;  [facility];  Service: Cardiovascular;   Laterality: N/A;   BIOPSY  04/17/2017   Procedure: BIOPSY;  Surgeon: Suzette Espy, MD;  [facility]: [facility];  Service: Endoscopy;;  esophageal   CARDIAC CATHETERIZATION     CAROTID-SUBCLAVIAN BYPASS GRAFT Left 03/19/2018   Procedure: BYPASS GRAFT CAROTID-SUBCLAVIAN;  Surgeon: Richrd Char, MD;  Location: [facility] OR;  Service: Vascular;  Laterality: Left;   CESAREAN SECTION  1985; 1988   COLONOSCOPY WITH PROPOFOL  N/A 04/17/2017   Procedure: COLONOSCOPY WITH PROPOFOL ;  Surgeon: Suzette Espy, MD;  [facility]: [facility];  Service: Endoscopy;  Laterality: N/A;  100   ESOPHAGOGASTRODUODENOSCOPY  Oct 2011   Dr. Homero Luster: ulcer in distal esophagus, soft stricture at GE junction s/p balloon dilation PATH: BARRETT'S   ESOPHAGOGASTRODUODENOSCOPY (EGD) WITH ESOPHAGEAL DILATION N/A 03/27/2013   Procedure: ESOPHAGOGASTRODUODENOSCOPY (EGD) WITH ESOPHAGEAL DILATION;  Surgeon: Suzette Espy, MD;  [facility];  Service: Endoscopy;  Laterality: N/A;  possible dilation   ESOPHAGOGASTRODUODENOSCOPY (EGD) WITH PROPOFOL  N/A 01/02/2017   Procedure: ESOPHAGOGASTRODUODENOSCOPY (EGD) WITH PROPOFOL ;  Surgeon: Suzette Espy, MD;  [facility];  Service: Endoscopy;  Laterality: N/A;  with possible esophageal dilation   ESOPHAGOGASTRODUODENOSCOPY (EGD) WITH PROPOFOL  N/A 04/17/2017   Procedure: ESOPHAGOGASTRODUODENOSCOPY (EGD) WITH PROPOFOL ;  Surgeon: Suzette Espy, MD;  [facility];  Service: Endoscopy;  Laterality: N/A;   EUS N/A 05/09/2013   Procedure: UPPER ENDOSCOPIC ULTRASOUND (EUS) LINEAR;  Surgeon: Janel Medford, MD;  Location: [facility];  Service: Endoscopy;  Laterality: N/A;   FRACTURE SURGERY     pt. denies   IR ANGIO INTRA EXTRACRAN SEL COM CAROTID INNOMINATE BILAT MOD SED  09/06/2022   IR ANGIO INTRA EXTRACRAN SEL COM CAROTID INNOMINATE BILAT MOD SED  09/13/2022   IR ANGIO VERTEBRAL SEL SUBCLAVIAN INNOMINATE BILAT MOD SED  09/13/2022   IR ANGIO VERTEBRAL SEL SUBCLAVIAN  INNOMINATE UNI R MOD SED  09/06/2022   IR ANGIOGRAM FOLLOW UP STUDY  09/13/2022   IR CT HEAD LTD  09/08/2022   IR CT HEAD LTD  05/27/2024   IR CT HEAD LTD  05/27/2024   IR NEURO EACH ADD'L AFTER BASIC UNI LEFT (MS)  09/13/2022   IR PATIENT EVAL TECH 0-60 MINS  05/28/2024   IR PERCUTANEOUS ART THROMBECTOMY/INFUSION INTRACRANIAL INC DIAG ANGIO  05/27/2024   IR TRANSCATH/EMBOLIZ  09/08/2022   IR US  GUIDE VASC ACCESS RIGHT  09/08/2022   IR US  GUIDE VASC ACCESS RIGHT  09/06/2022   KNEE ARTHROSCOPY Right    LAPAROSCOPIC CHOLECYSTECTOMY     LEFT HEART CATH AND CORONARY ANGIOGRAPHY N/A 02/26/2018   Procedure: LEFT HEART CATH AND CORONARY ANGIOGRAPHY;  Surgeon: Odie Benne, MD;  [facility];  Service: Cardiovascular;  Laterality: N/A;  MALONEY DILATION N/A 04/17/2017   Procedure: Londa Rival DILATION;  Surgeon: Suzette Espy, MD;  [facility];  Service: Endoscopy;  Laterality: N/A;   PATELLA FRACTURE SURGERY Left    POLYPECTOMY  04/17/2017   Procedure: POLYPECTOMY;  Surgeon: Suzette Espy, MD;  [facility];  Service: Endoscopy;;   RADIOLOGY WITH ANESTHESIA N/A 09/08/2022   Procedure: L ICA Aneurysm Embolazation;  Surgeon: Luellen Sages, MD;  Location: [facility];  Service: Radiology;  Laterality: N/A;   RADIOLOGY WITH ANESTHESIA N/A 05/27/2024   Procedure: RADIOLOGY WITH ANESTHESIA;  Surgeon: Radiologist, Medication, MD;  Location: [facility];  Service: Radiology;  Laterality: N/A;   TEE WITHOUT CARDIOVERSION N/A 01/23/2019   Procedure: TRANSESOPHAGEAL ECHOCARDIOGRAM (TEE);  Surgeon: Hugh Madura, MD;  Location: [facility] ENDOSCOPY;  Service: Cardiovascular;  Laterality: N/A;   TUBAL LIGATION  1992   UPPER EXTREMITY ANGIOGRAPHY N/A 01/19/2018   Procedure: UPPER EXTREMITY ANGIOGRAPHY;  Surgeon: Richrd Char, MD;  [facility];  Service: Cardiovascular;  Laterality: N/A;     reports that she has been smoking cigarettes. She started smoking about 41 years  ago. She has a 20.9 pack-year smoking history. She has never used smokeless tobacco. She reports that she does not drink alcohol  and does not use drugs.  Family History  Problem Relation Age of Onset   Asthma Mother    Heart failure Mother    Cancer Mother        pancreatic   Diabetes Mother    Hypertension Mother    Stroke Mother    Pancreatic cancer Mother        deceased   Heart failure Father    Diabetes Father    Colon cancer Neg Hx      Physical Exam: Vitals:   06/13/24 0430 06/13/24 0456 06/13/24 0600 06/13/24 0615  BP: (!) 113/59  (!) 99/57 (!) 123/49  Pulse: (!) 122 (!) 118 (!) 116 (!) 115  Resp: (!) 25 (!) 25 (!) 24 (!) 23  Temp:      TempSrc:      SpO2: 95% 96% 96% 98%  Weight:      Height:        Gen: Awake, alert, acutely and chronically ill appearing.  HEENT: Dark brown / black residue on the tongue, no tongue bite wounds.   CV: Tachycardic, regular, normal S1, S2, no murmurs  Resp: Normal WOB, there is scant wheezing. Rales in the bases.  Abd: Round, nondistended, hyperactive, nontender MSK: Symmetric, no edema  Skin: No rashes or lesions to exposed skin  Neuro: Alert and interactive, oriented to self only, CN exam limited by ability to follow commands, unable to assess VF. CN VII with mild R lower facial droop, otherwise 3-12 appear intact. Motor is 4/5 in the RUE. 5/5 on the LUE. RLE unclear if not understanding command v 0/5 strength. LLE 4/5. Sensation appears intact to fine touch.  Psych: global aphasia limiting assessment    Data review:   Labs reviewed, notable for:   VBG 7.37/22, bicarb 13, anion gap 22, Lactate 6.9 -> 4.1 NA corrected within normal limit K3, CR 2.65 (baseline 0.7), BUN 76  Blood glucose 515  HS trop 65 -> 122  WBC 35, neutrophilic Hemoglobin 13, platelet 944 Urinalysis questionable for UTI, ketone neg.    Micro:  Results for orders placed or performed during the hospital encounter of 05/27/24  SARS Coronavirus 2 by RT  PCR (hospital order, performed in [facility] hospital lab) *cepheid single result test* Anterior Nasal Swab     Status: None   Collection Time: 05/27/24  4:18 PM   Specimen: Anterior Nasal Swab  Result Value Ref Range Status   SARS Coronavirus 2 by RT PCR NEGATIVE NEGATIVE Final    Comment: Performed at [facility], [address], [place], [place] [address]  MRSA Next Gen by PCR, Nasal     Status: None   Collection Time: 05/27/24  7:24 PM   Specimen: Nasal Mucosa; Nasal Swab  Result Value Ref Range Status   MRSA by PCR Next Gen NOT DETECTED NOT DETECTED Final    Comment: (NOTE) The GeneXpert MRSA Assay (FDA approved for NASAL specimens only), is one component of a comprehensive MRSA colonization surveillance program. It is not intended to diagnose MRSA infection nor to guide or monitor treatment for MRSA infections. Test performance is not FDA approved in patients less than 87 years old. Performed at [facility], [address], [place], [place] [address]     Imaging reviewed:  CT Renal Stone Study Result Date: 06/13/2024 CLINICAL DATA:  58 year old female with abdomen and flank pain. History of stroke. EXAM: CT ABDOMEN AND PELVIS WITHOUT CONTRAST TECHNIQUE: Multidetector CT imaging of the abdomen and pelvis was performed following the standard protocol without IV contrast. RADIATION DOSE REDUCTION: This exam was performed according to the departmental dose-optimization program which includes automated exposure control, adjustment of the mA and/or kV according to patient size and/or use of iterative reconstruction technique. COMPARISON:  CT Abdomen and Pelvis 01/20/2019. FINDINGS: Lower chest: Normal heart size. Negative lung bases. No pericardial or pleural effusion. Mildly thickened appearance of the distal thoracic esophagus, appears to be circumferential and is similar to the 2020 comparison (series 2, image 4). Hepatobiliary: Chronic cholecystectomy and hepatic steatosis.  Pancreas: Partially atrophied since 2020. Spleen: Negative. Adrenals/Urinary Tract: Normal adrenal glands. Nonobstructed kidneys. No nephrolithiasis. Symmetric diminutive ureters. Diminutive bladder. Small pelvic phleboliths. Stomach/Bowel: Fluid-filled small and large bowel loops throughout the abdomen and pelvis, fluid in the large bowel to the rectum. No abnormally dilated loops. No transition point. Moderate fluid distended stomach also. No pneumoperitoneum. No focal mesenteric inflammation identified. Normal appendix identified on coronal image 54. No free fluid. Vascular/Lymphatic: Aortoiliac calcified atherosclerosis. Normal caliber abdominal aorta. No lymphadenopathy. Reproductive: Negative noncontrast appearance. Other: No pelvis free fluid. Musculoskeletal: Chronic lower lumbar disc degeneration with vacuum disc. No acute osseous abnormality identified. IMPRESSION: 1. Fluid-filled bowel throughout the abdomen and pelvis suggesting Enteritis/Diarrhea. Normal appendix. No evidence of bowel obstruction or perforation. 2. This circumferentially thickened appearance of the distal esophagus, similar to a 2020 CT. Consider acute or chronic Esophagitis. 3. No other acute or inflammatory process identified in the noncontrast abdomen or pelvis. Chronic cholecystectomy and hepatic steatosis. Aortic Atherosclerosis (ICD10-I70.0). Electronically Signed   By: Marlise Simpers M.D.   On: 06/13/2024 05:09   DG Chest Portable 1 View Result Date: 06/13/2024 CLINICAL DATA:  Suffered a stroke last week. EXAM: PORTABLE CHEST 1 VIEW COMPARISON:  May 28, 2024 FINDINGS: The heart size and mediastinal contours are within normal limits. There is no evidence of acute infiltrate, pleural effusion or pneumothorax. The visualized skeletal structures are unremarkable. IMPRESSION: No active disease. Electronically Signed   By: Virgle Grime M.D.   On: 06/13/2024 03:22   CT Head Wo Contrast Result Date: 06/13/2024 EXAM: CT HEAD WITHOUT  CONTRAST 06/13/2024 03:13:05 AM TECHNIQUE: CT of the head was performed without the administration of intravenous  contrast. Automated exposure control, iterative reconstruction, and/or weight based adjustment of the mA/kV was utilized to reduce the radiation dose to as low as reasonably achievable. COMPARISON: MRI brain dated 05/28/2024. CLINICAL HISTORY: Mental status change, unknown cause. Pt is drowsy and only responding to name being called at this time. Altered mental status. FINDINGS: BRAIN AND VENTRICLES: There is no acute intracranial hemorrhage, mass effect or midline shift. No abnormal extra-axial fluid collection. The gray-white differentiation is maintained without an acute infarct. There is no hydrocephalus. Old left PCA (posterior cerebral artery) distribution and MCA (middle cerebral artery) watershed infarcts. Old right cerebellar infarct. Global cortical atrophy. Subcortical and periventricular small vessel ischemic changes. ORBITS: The visualized portion of the orbits demonstrate no acute abnormality. SINUSES: The visualized paranasal sinuses and mastoid air cells demonstrate no acute abnormality. SOFT TISSUES AND SKULL: No acute abnormality of the visualized skull or soft tissues. VASCULATURE: Intracranial atherosclerosis. Left ICA (internal carotid artery) stent. IMPRESSION: 1. No acute intracranial abnormality. 2. Old left PCA distribution and MCA watershed infarcts. Old right cerebellar infarct. 3. Atrophy with small vessel ischemic changes. Electronically signed by: Zadie Herter MD 06/13/2024 03:18 AM EDT RP Workstation: [id number]    EKG:  Personally reviewed, sinus tachycardia, RBBB, borderline prolonged QTc 493, computer read of acute inferior / posterior infarct. There is possible concordant ST depression in inferior leads.   ED Course:  Treated with vancomycin, ceftriaxone , Flagyl, 3 L IV fluid, and started on maintenance IV fluid.,  For severe range hyperglycemia and possible  DKA treated with 10 units of aspart and started on insulin  drip.  Ordered for 4 runs of potassium.  Treated with PPI IV for suspected GI bleeding   Assessment/Plan:  58 y.o. female with hx of recent history of admission 6/2 - 6/11 with L MCA infarct secondary to left distal ICA stent thrombosis (initial placement 9/'23) and M1 occlusion s/p TNK and Neuro IR thrombectomy, c/b R hemiparesis, global aphasia, dysphagia; additional history including, HTN, HLD, substance use disorder (cocaine), smoking, who was brought in by EMS from SNF after found with decreased responsiveness on the toilet, with what appeared to be melena and dark material around mouth, possible coffee ground emesis. Complex admission here with problems including severe sepsis suspected GI v urinary source, possible GI bleeding, early / developing DKA, AKI, acute myocardial injury, encephalopathy.   Severe sepsis, suspected GI v urinary source  Lactic acidosis  On initial evaluation tachycardic in the 130s, tachypneic mid 20s. WBC 35, neurophil predominant. Lactate 6.9 -> 4.1 after ~ 3L IVF. CXR with no acute infiltrate. CT A/P with fluid filled bowel consistent with enteritis / diarrheal state. UA suspicious for UTI. Patient unable to provide history, suspect GI v urinary source per workup obtained.  - Continue Ceftriaxone  2 g IV q 24 hr, Flagyl 500 mg IV q12 hr. S/p Vanc in ED.  - s/p ~ 3L of IVF and remains on mIVF per insulin  gtt protocol. Lactate improving 6.9 -> 4.1. Continue to trend but hold on additional boluses.  - F/u Blood cultures, urine culture. If there is diarrhea, send for GI pathogen and C diff. Enteric precautions until ruled out.   Suspected upper GI bleeding  Found on toilet with suspected melena, possible coffee ground emesis around mouth. No active bleeding since ED arrival. Hb is 13. PLT acutely increased 944. She is on DAPT after recent carotid stent occlusion and M1 occlusion s/p TNK and mechanical thrombectomy.   - GI consult this morning, did not contact overnight  as close to changeover.  - NPO, sips with meds  - Pantoprazole  40 mg IV BID  - FOBT pending  - Ideally we should continue DAPT in setting of recent stent occlusion and stroke, see additional discussion below. Will involve neuroradiology   Early / developing DKA  Hx DM2 On presentation hyperglycemic to 515, VBG 7.37/22, bicarb 13, anion gap 22, Lactate 6.9 -> 4.1. Beta hydroxy butyrate 0.44. Osm pending. UA negative for ketone. Overall suspect acidosis mainly driven by her severe sepsis with limited ketosis / ketoacidosis. However given acuity of illness placed on insulin  gtt in the ED which is reasonable, but will need K repletion prior to resuming. Last A1c 6.3 % 6/2.  -Insulin  drip, DKA protocol paused until next BMP result due to hypokalemia  -see IVF in sepsis above  -BMP q4 hr, Beta OH butyrate q4 hr.  -K repletion  -DM educator c/s  -N.p.o. except sip with meds.   Acute kidney injury  Baseline Cr 0.7-> elevated to 2.65 on admission. Prerenal injury in setting of above.  - See IVF above.  - Check PVR  - If fails to improve, consider renal US .   Acute myocardial injury Does not provide history. EKG with question of ischemic changes. HS trop 65 -> 122. Suspect demand in setting of sepsis, hypovolemia predominately.  - Management directed at sepsis, volume status above.   Encephalopathy, acute metabolic Somnolent with decreased responsiveness on arrival. On my evaluation significant improvement and now alert. She has chronic neuro deficits consistent with her prior stroke. CT Head negative for acute changes. Suspect metabolic cause related to infection, early DKA.  - Management directed at issues above, improving with treatment of these.  - Delirium precautions, fall precautions   Hx recent L MCA infarct secondary to left distal ICA stent thrombosis (initial placement 9/'23) and M1 occlusion s/p TNK and Neuro IR thrombectomy,   c/b R hemiparesis, global aphasia, dysphagia - Due to recent carotid stent thrombosis, M1 occlusion resulting in stroke would ideally not interrupt DAPT with aspirin  and Ticagrelor  unless she has life threatening bleeding.  - For now tentatively ordered to continue aspirin , Ticagrelor .  - Will contact neuro IR, Dr. Alvira Josephs re: antiplatelet management and contingency planning in case develops more significant bleeding.  - Continue home statin   Thrombocytosis  Suspect related to hemoconcentration, possible iron deficiency  - check iron panel.   Chronic medical problems:  HTN: Hold home Amlodipine  in setting of sepsis / hypovolemia  HLD: continue home atorvastatin .  Substance use disorder: Hx recent cocaine use. Check UDS.  Smoking cessation: Continue nicotine  patch  Dysphagia: NPO per above, when taking PO will need dysphagia 1 with nectar thick liquid.  Chronic pain: Resume home Suboxone  this afternoon, holding AM dose    Body mass index is 34.05 kg/m. Obesity class I would benefit from weight loss outpatient.    DVT prophylaxis:  SCDs Code Status:  Full Code Diet:  Diet Orders (From admission, onward)     Start     Ordered   06/13/24 0602  Diet NPO time specified Except for: Sips with Meds  Diet effective now       Question:  Except for  Answer:  Bula Carney with Meds   06/13/24 0602           Family Communication:  None   Consults:  None   Admission status:   Inpatient, Step Down Unit  Severity of Illness: The appropriate patient status for this patient is INPATIENT.  Inpatient status is judged to be reasonable and necessary in order to provide the required intensity of service to ensure the patient's safety. The patient's presenting symptoms, physical exam findings, and initial radiographic and laboratory data in the context of their chronic comorbidities is felt to place them at high risk for further clinical deterioration. Furthermore, it is not anticipated that the patient  will be medically stable for discharge from the hospital within 2 midnights of admission.   * I certify that at the point of admission it is my clinical judgment that the patient will require inpatient hospital care spanning beyond 2 midnights from the point of admission due to high intensity of service, high risk for further deterioration and high frequency of surveillance required.*   Arnulfo Larch, MD [facility]  How to contact the [facility] Attending or Consulting provider 7A - 7P or covering provider during after hours 7P -7A, for this patient.  Check the care team in [facility] and look for a) attending/consulting [facility] provider listed and b) the [facility] team listed Log into [email or web address] and use [facility] universal password to access. If you do not have the password, please contact the hospital operator. Locate the [facility] provider you are looking for under [facility] and page to a number that you can be directly reached. If you still have difficulty reaching the provider, please page the [facility] (Director on Call) for the Hospitalists listed on amion for assistance.  06/13/2024, 6:47 AM

## 2024-06-13 NOTE — Plan of Care (Signed)

## 2024-06-13 NOTE — Progress Notes (Addendum)
 PROGRESS NOTE   Rita [name]  NWG:[id number] DOB: [birth year] DOA: 06/13/2024 PCP: [facility]   Chief Complaint  Patient presents with   Hematemesis   Level of care: Stepdown  Brief Admission History:  58 year old female recently discharged from [facility] on 06/05/2024 after being treated for large left MCA territory infarcts due to left distal ICA stent and MCA occlusion status post TNK and IR with TICI3 reperfusion, prior history of stroke, hypertension, hyperlipidemia, cocaine and marijuana abuse, tobacco abuse, dysphagia, PAD, obesity, history of right lower lobe lung mass had initially been recommended to go to CIR however she ended up going to SNF at [facility].  She was supposed to be on aspirin  and Brilinta , Lipitor  and Zetia .  She presented to ED by EMS from [facility] about 10 minutes after they found her on the toilet with what appeared to be dried blood around the mouth and blood in the toilet.  She apparently had some melanotic stool.  Upon review of the MAR from the facility and discussing with our Pharm.D. patient was actually receiving Plavix  75 mg twice daily in addition to aspirin  81 mg daily.  She was severely dehydrated with a blood glucose of 528.  Her lactic acid was severely elevated at 6.9.  Her WBC was 35.0.  She had an AKI with a creatinine of 2.65.  She was hypokalemic with electrolyte abnormalities.  She was briefly placed on an IV insulin  infusion in the ED.  Patient was given IV fluid boluses and empirically given some IV antibiotics and admission was requested.   Assessment and Plan:  Severe Dehydration  -- likely from severe hyperglycemia (glucotoxicity) -- IV fluids ordered for aggressive volume replacement   Hyperglycemic Hyperosmolar Syndrome -- briefly was on IV insulin  but treatment really is hydration -- aggressive IV fluid hydration   Lactic Acidosis  Metabolic Acidosis  -- suspect from severe dehydration  -- trend and treat with  IV fluids  -- added bicarb to IV fluid  -- recheck labs again in AM  -- repeated lactic acid at 10:30 am was 1.6 (normal)  -- sepsis has been ruled out   Acute Metabolic Encephalopathy -- improving slowly with treatments   Acute Renal Failure  -- prerenal given severe dehydration  -- aggressive volume replacement   Coffee [place] Emesis Upper GI bleed  -- appreciate GI consult  -- ok to resume DAPT -- IV pantoprazole  ordered -- monitor for signs of severe bleeding -- with recent CVA trying to avoid endoscopy at this time  Cerebrovascular disease s/p recent large left MCA infarct -- for some unknown reason patient was being given plavix  75 mg BID instead of brilinta  90 mg BID per [facility] records confirmed with our pharm D -- discussed with GI service, ok to continue DAPT given no severe bleeding and monitor  Hypokalemia -- IV replacement ordered -- check Mg and replete as needed   Right lower lobe lung mass -- pt was referred to outpatient pulmonary nodule clinic during last recent hospitalization at [facility]  Hypomagnesemia - severe  Hypophosphatemia - severe  -- IV replacement ordered -- recheck levels in AM, likely will need more replacement   DVT prophylaxis: SCDs Code Status: DNR  Family Communication:  Disposition: anticipate return to [facility]    Consultants:  GI  Procedures:  Na Antimicrobials:  Ceftriaxone  6/19 Metronidazole 6/19   Subjective: Pt c/o dry mouth, wanting to eat/drink; difficulty speaking   Objective: Vitals:   06/13/24 1500 06/13/24  1600 06/13/24 1621 06/13/24 1700  BP: 120/67 (!) 98/42  121/65  Pulse: (!) 102 (!) 110  (!) 111  Resp: 18 20  12   Temp:   98.6 F (37 C)   TempSrc:   Oral   SpO2: 95% 96%  97%  Weight:      Height:        Intake/Output Summary (Last 24 hours) at 06/13/2024 1730 Last data filed at 06/13/2024 1622 Gross per 24 hour  Intake 1592.6 ml  Output 1050 ml  Net 542.6 ml   Filed Weights   06/13/24 0222   Weight: 98.6 kg   Examination:  General exam: Appears acutely and chronically ill poor dentition; calm and comfortable, dysphonia   Respiratory system: Clear to auscultation. Respiratory effort normal. Cardiovascular system: normal S1 & S2 heard. No JVD, murmurs, rubs, gallops or clicks. No pedal edema. Gastrointestinal system: Abdomen is nondistended, soft and nontender. No organomegaly or masses felt. Normal bowel sounds heard. Central nervous system: Alert and oriented. Facial droop on right, dysphonia, 4/5 RUE, 5/5 BLEs ambulating with assist.  Extremities: no cyanosis. Skin: No rashes, lesions or ulcers. Psychiatry: Judgement and insight appear normal. Mood & affect appropriate.   Data Reviewed: I have personally reviewed following labs and imaging studies  CBC: Recent Labs  Lab 06/13/24 0236  WBC 35.0*  NEUTROABS 31.9*  HGB 13.1  HCT 39.3  MCV 88.9  PLT 944*    Basic Metabolic Panel: Recent Labs  Lab 06/13/24 0236 06/13/24 0653 06/13/24 1141  NA 132* 137 137  K 3.0* 3.2* 3.2*  CL 97* 110 105  CO2 13* 16* 19*  GLUCOSE 515* 142* 198*  BUN 76* 90* 94*  CREATININE 2.65* 1.96* 1.75*  CALCIUM  8.3* 7.8* 8.0*  MG  --  1.0*  --   PHOS  --  <1.0*  --     CBG: Recent Labs  Lab 06/13/24 0420 06/13/24 0531 06/13/24 0817 06/13/24 1157 06/13/24 1620  GLUCAP 240* 163* 152* 219* 195*    Recent Results (from the past 240 hours)  Blood culture (routine x 2)     Status: None (Preliminary result)   Collection Time: 06/13/24  2:24 AM   Specimen: BLOOD  Result Value Ref Range Status   Specimen Description BLOOD BLOOD LEFT ARM  Final   Special Requests   Final    BOTTLES DRAWN AEROBIC AND ANAEROBIC Blood Culture results may not be optimal due to an inadequate volume of blood received in culture bottles   Culture   Final    NO GROWTH < 12 HOURS Performed at [facility], [address], [place], [place] [address]    Report Status PENDING  Incomplete  Blood culture  (routine x 2)     Status: None (Preliminary result)   Collection Time: 06/13/24  2:36 AM   Specimen: BLOOD  Result Value Ref Range Status   Specimen Description BLOOD BLOOD LEFT HAND  Final   Special Requests   Final    BOTTLES DRAWN AEROBIC AND ANAEROBIC Blood Culture adequate volume   Culture   Final    NO GROWTH < 12 HOURS Performed at [facility], [address], [place], [place] [address]    Report Status PENDING  Incomplete  MRSA Next Gen by PCR, Nasal     Status: None   Collection Time: 06/13/24  8:06 AM   Specimen: Nasal Mucosa; Nasal Swab  Result Value Ref Range Status   MRSA by PCR Next Gen NOT DETECTED NOT  DETECTED Final    Comment: (NOTE) The GeneXpert MRSA Assay (FDA approved for NASAL specimens only), is one component of a comprehensive MRSA colonization surveillance program. It is not intended to diagnose MRSA infection nor to guide or monitor treatment for MRSA infections. Test performance is not FDA approved in patients less than 28 years old. Performed at [facility], [address] [place], [place] [address]      Radiology Studies: CT Renal Stone Study Result Date: 06/13/2024 CLINICAL DATA:  58 year old female with abdomen and flank pain. History of stroke. EXAM: CT ABDOMEN AND PELVIS WITHOUT CONTRAST TECHNIQUE: Multidetector CT imaging of the abdomen and pelvis was performed following the standard protocol without IV contrast. RADIATION DOSE REDUCTION: This exam was performed according to the departmental dose-optimization program which includes automated exposure control, adjustment of the mA and/or kV according to patient size and/or use of iterative reconstruction technique. COMPARISON:  CT Abdomen and Pelvis 01/20/2019. FINDINGS: Lower chest: Normal heart size. Negative lung bases. No pericardial or pleural effusion. Mildly thickened appearance of the distal thoracic esophagus, appears to be circumferential and is similar to the 2020 comparison (series 2, image  4). Hepatobiliary: Chronic cholecystectomy and hepatic steatosis. Pancreas: Partially atrophied since 2020. Spleen: Negative. Adrenals/Urinary Tract: Normal adrenal glands. Nonobstructed kidneys. No nephrolithiasis. Symmetric diminutive ureters. Diminutive bladder. Small pelvic phleboliths. Stomach/Bowel: Fluid-filled small and large bowel loops throughout the abdomen and pelvis, fluid in the large bowel to the rectum. No abnormally dilated loops. No transition point. Moderate fluid distended stomach also. No pneumoperitoneum. No focal mesenteric inflammation identified. Normal appendix identified on coronal image 54. No free fluid. Vascular/Lymphatic: Aortoiliac calcified atherosclerosis. Normal caliber abdominal aorta. No lymphadenopathy. Reproductive: Negative noncontrast appearance. Other: No pelvis free fluid. Musculoskeletal: Chronic lower lumbar disc degeneration with vacuum disc. No acute osseous abnormality identified. IMPRESSION: 1. Fluid-filled bowel throughout the abdomen and pelvis suggesting Enteritis/Diarrhea. Normal appendix. No evidence of bowel obstruction or perforation. 2. This circumferentially thickened appearance of the distal esophagus, similar to a 2020 CT. Consider acute or chronic Esophagitis. 3. No other acute or inflammatory process identified in the noncontrast abdomen or pelvis. Chronic cholecystectomy and hepatic steatosis. Aortic Atherosclerosis (ICD10-I70.0). Electronically Signed   By: Marlise Simpers M.D.   On: 06/13/2024 05:09   DG Chest Portable 1 View Result Date: 06/13/2024 CLINICAL DATA:  Suffered a stroke last week. EXAM: PORTABLE CHEST 1 VIEW COMPARISON:  May 28, 2024 FINDINGS: The heart size and mediastinal contours are within normal limits. There is no evidence of acute infiltrate, pleural effusion or pneumothorax. The visualized skeletal structures are unremarkable. IMPRESSION: No active disease. Electronically Signed   By: Virgle Grime M.D.   On: 06/13/2024 03:22   CT  Head Wo Contrast Result Date: 06/13/2024 EXAM: CT HEAD WITHOUT CONTRAST 06/13/2024 03:13:05 AM TECHNIQUE: CT of the head was performed without the administration of intravenous contrast. Automated exposure control, iterative reconstruction, and/or weight based adjustment of the mA/kV was utilized to reduce the radiation dose to as low as reasonably achievable. COMPARISON: MRI brain dated 05/28/2024. CLINICAL HISTORY: Mental status change, unknown cause. Pt is drowsy and only responding to name being called at this time. Altered mental status. FINDINGS: BRAIN AND VENTRICLES: There is no acute intracranial hemorrhage, mass effect or midline shift. No abnormal extra-axial fluid collection. The gray-white differentiation is maintained without an acute infarct. There is no hydrocephalus. Old left PCA (posterior cerebral artery) distribution and MCA (middle cerebral artery) watershed infarcts. Old right cerebellar infarct. Global cortical atrophy. Subcortical and  periventricular small vessel ischemic changes. ORBITS: The visualized portion of the orbits demonstrate no acute abnormality. SINUSES: The visualized paranasal sinuses and mastoid air cells demonstrate no acute abnormality. SOFT TISSUES AND SKULL: No acute abnormality of the visualized skull or soft tissues. VASCULATURE: Intracranial atherosclerosis. Left ICA (internal carotid artery) stent. IMPRESSION: 1. No acute intracranial abnormality. 2. Old left PCA distribution and MCA watershed infarcts. Old right cerebellar infarct. 3. Atrophy with small vessel ischemic changes. Electronically signed by: Zadie Herter MD 06/13/2024 03:18 AM EDT RP Workstation: [id number]    Scheduled Meds:  arformoterol   15 mcg Nebulization BID   aspirin  EC  81 mg Oral Daily   atorvastatin   80 mg Oral QHS   budesonide   0.5 mg Nebulization BID   buprenorphine -naloxone   1 tablet Sublingual TID   Chlorhexidine  Gluconate Cloth  6 each Topical Q0600   ezetimibe   10 mg Oral  Daily   insulin  aspart  0-9 Units Subcutaneous Q4H   nicotine   14 mg Transdermal Daily   pantoprazole  (PROTONIX ) IV  40 mg Intravenous Q12H   sodium chloride  flush  3 mL Intravenous Q12H   ticagrelor   90 mg Oral BID   Continuous Infusions:  [START ON 06/14/2024] cefTRIAXone  (ROCEPHIN )  IV     metronidazole 500 mg (06/13/24 1639)   sodium bicarbonate 150 mEq in sterile water  1,150 mL infusion 150 mL/hr at 06/13/24 1640     LOS: 0 days   Critical Care Procedure Note Authorized and Performed by: Olga Berthold MD  Total Critical Care time:  65 mins Due to a high probability of clinically significant, life threatening deterioration, the patient required my highest level of preparedness to intervene emergently and I personally spent this critical care time directly and personally managing the patient.  This critical care time included obtaining a history; examining the patient, pulse oximetry; ordering and review of studies; arranging urgent treatment with development of a management plan; evaluation of patient's response of treatment; frequent reassessment; and discussions with other providers.  This critical care time was performed to assess and manage the high probability of imminent and life threatening deterioration that could result in multi-organ failure.  It was exclusive of separately billable procedures and treating other patients and teaching time.    Faustino Hook, MD How to contact the [facility] Attending or Consulting provider 7A - 7P or covering provider during after hours 7P -7A, for this patient?  Check the care team in [facility] and look for a) attending/consulting [facility] provider listed and b) the [facility] team listed Log into [email or web address] to find provider on call.  Locate the [facility] provider you are looking for under [facility] and page to a number that you can be directly reached. If you still have difficulty reaching the provider, please page the [facility] (Director on Call) for the Hospitalists  listed on amion for assistance.  06/13/2024, 5:30 PM

## 2024-06-13 NOTE — ED Notes (Signed)
 Peterson Brandt, RN attempted to call report at [phone]

## 2024-06-13 NOTE — ED Notes (Signed)
Pt continues to sleep intermittently.   

## 2024-06-13 NOTE — ED Notes (Signed)
 Pt is more alert. Able to nod head for yes and no. Boyfriend at bedside stated pt had a stroke recently.

## 2024-06-13 NOTE — Consult Note (Signed)
 Gastroenterology Consult   Referring Provider: No ref. provider found Primary Care Physician:  [facility] Primary Gastroenterologist:  Rheba Cedar, MD  Patient ID: Rita [name]; [id number]; Apr 02, 1966   Admit date: 06/13/2024  LOS: 0 days  Date of Consultation: 06/13/2024 Reason for Consultation:  coffee ground emesis on DAPT    History of Present Illness   Rita [name] is a 58 y.o. female with global aphasia, recent admission 6/2-6/11 with L MCA infarct secondary to left distal ICA stent thrombosis (initial placement 08/2022) and M1 occlusion s/p TNK and Neuro IR thrombectomy, complicated by R hemiparesis, global aphasia, dysphagia. Additional history of HTN, HLD, substance use disorder (cocaine), smoking, history of Barrett's esophagus (2011 pathology but not evident on follow-up EGD in 2014 and 2018), h/o remote pancreatitis (unremarkable EUS in 2014), COPD who was brought in by EMS from SNF after found with decreased responsiveness on toilet, appeared to have melena and dark material around mouth, possible coffee ground emesis.   In ED: White blood cell count 35,000, hemoglobin 13.1, platelets 944,000, sodium 132, potassium 3, glucose 515, creatinine 2.65 (up from 0.76), BUN 76 (up from 25), albumin 3.1, total bilirubin 0.6, alk phos 78, AST 31, ALT 39, lipase 40, lactic acid 6.9, beta hydroxybutyrate acid 0.44, troponin I- 65, urine drug screen negative (2 weeks ago tested positive for cocaine and THC).  CT head with no acute findings.  Old left PCA distribution and MCA watershed infarcts.  Old right cerebellar infarct.  Chest x-ray with no active disease.  CT renal stone study with mildly thickened appearance of the distal thoracic esophagus, appears to be circumferential, similar to 2020 study.  Hepatic steatosis.  Partially atrophied pancreas since 2020.  Fluid-filled small and large bowel throughout the abdomen and pelvis, fluid in the large bowel to the rectum.   Moderately fluid distended stomach suggesting enteritis/diarrhea.  Repeat lactic acid 4.1 this morning.  Troponin up to 122.  Potassium 3.2, glucose 142, BUN 90, creatinine 1.96, beta hydroxybutyrate acid 0.28, magnesium  1.0, phosphorus less than 1.  GI consult: obtained information from patient's long term significant other, Rita [name]. He states patient was doing well until about 3 days ago. Then she started having more lethargy. Not wanting to do PT. Not acting like herself. She was found with coffee ground emesis and melena as previously reported. He has not heard of any history of diarrhea. States she had been eating very well. No vomiting. He cautioned saying that patient can answer yes or no but he has witnessed her say yes when answer should be no vice versa. He has not witnessed any swallowing issues. Ate a fish sandwich with him couple of days ago. Patient reports heartburn. She denies abdominal pain. Denies diarrhea.   According to [facility] pharmacist, patient was receiving Plavix  BID instead of Brilinta  BID per current MAR. Unclear reason why, discharge papers from last admission had her on Brilinta .    EGD April 2018: - LA grade B esophagitis.  Abnormal distal esophagus.  Status post dilation and biopsy.  Inflammation consistent with reflux.  No evidence of Barrett's - Medium size hiatal hernia  Colonoscopy April 2018: - Diverticulosis in the sigmoid colon and in the descending colon. -one 15 mm polyp at the hepatic flexure removed -one 7 mm polyp at the hepatic flexure removed - Internal hemorrhoids - Tubular adenomas - Next colonoscopy in 3 years    Prior to Admission medications   Medication Sig Start Date End Date Taking?  Authorizing Provider  amLODipine  (NORVASC ) 5 MG tablet Take 1 tablet (5 mg total) by mouth daily. 06/06/24  Yes Audrene Lease, NP  arformoterol  (BROVANA ) 15 MCG/2ML NEBU Take 2 mLs (15 mcg total) by nebulization 2 (two) times daily. 06/05/24  Yes Audrene Lease, NP  aspirin  81 MG chewable tablet Chew 1 tablet (81 mg total) by mouth daily. 06/06/24  Yes Audrene Lease, NP  atorvastatin  (LIPITOR ) 80 MG tablet Take 1 tablet (80 mg total) by mouth daily at 6 PM. Patient taking differently: Take 80 mg by mouth daily. 04/01/23 06/13/24 Yes Shah, Pratik D, DO  budesonide  (PULMICORT ) 0.5 MG/2ML nebulizer solution Take 2 mLs (0.5 mg total) by nebulization 2 (two) times daily. 06/05/24  Yes Celia Coles C, NP  Buprenorphine  HCl-Naloxone  HCl 8-2 MG FILM Take 1 Film by mouth 3 (three) times daily. 04/01/23  Yes Shah, Pratik D, DO  docusate sodium  (COLACE) 100 MG capsule Take 1 capsule (100 mg total) by mouth 2 (two) times daily as needed for mild constipation. 06/05/24  Yes Celia Coles C, NP  ezetimibe  (ZETIA ) 10 MG tablet Take 1 tablet (10 mg total) by mouth daily. 04/01/23 06/13/24 Yes Shah, Pratik D, DO  ipratropium-albuterol  (DUONEB) 0.5-2.5 (3) MG/3ML SOLN Take 3 mLs by nebulization every 4 (four) hours as needed. 06/05/24  Yes Celia Coles C, NP  nicotine  (NICODERM CQ  - DOSED IN MG/24 HOURS) 14 mg/24hr patch Place 1 patch (14 mg total) onto the skin daily. 06/06/24  Yes Audrene Lease, NP  pantoprazole  (PROTONIX ) 40 MG tablet Take 1 tablet (40 mg total) by mouth daily. 06/06/24  Yes Celia Coles C, NP  polyethylene glycol (MIRALAX  / GLYCOLAX ) 17 g packet Take 17 g by mouth daily as needed for moderate constipation. 06/05/24  Yes Celia Coles C, NP  insulin  aspart (NOVOLOG ) 100 UNIT/ML injection Inject 0-9 Units into the skin every 4 (four) hours. Patient not taking: Reported on 06/13/2024 06/05/24   Audrene Lease, NP  ticagrelor  (BRILINTA ) 90 MG TABS tablet Take 1 tablet (90 mg total) by mouth 2 (two) times daily. Patient not taking: Reported on 06/13/2024 06/05/24   Audrene Lease, NP  Per [facility] pharmacist, SNF [facility] shows patient receiving Plavix  BID not Brilinta  BID  Current Facility-Administered Medications  Medication Dose Route Frequency Provider Last Rate Last Admin    arformoterol  (BROVANA ) nebulizer solution 15 mcg  15 mcg Nebulization BID Segars, Jonathan, MD   15 mcg at 06/13/24 1610   atorvastatin  (LIPITOR ) tablet 80 mg  80 mg Oral QHS Segars, Jonathan, MD       budesonide  (PULMICORT ) nebulizer solution 0.5 mg  0.5 mg Nebulization BID Segars, Jonathan, MD   0.5 mg at 06/13/24 0845   buprenorphine -naloxone  (SUBOXONE ) 8-2 mg per SL tablet 1 tablet  1 tablet Sublingual TID Segars, Jonathan, MD       Cecily Cohen ON 06/14/2024] cefTRIAXone  (ROCEPHIN ) 2 g in sodium chloride  0.9 % 100 mL IVPB  2 g Intravenous Q24H Segars, Arlyce Lambert, MD       Chlorhexidine  Gluconate Cloth 2 % PADS 6 each  6 each Topical Q0600 Johnson, Clanford L, MD   6 each at 06/13/24 9604   dextrose  50 % solution 0-50 mL  0-50 mL Intravenous PRN Mesner, Reymundo Caulk, MD       ezetimibe  (ZETIA ) tablet 10 mg  10 mg Oral Daily Segars, Jonathan, MD       insulin  aspart (novoLOG ) injection 0-9 Units  0-9 Units Subcutaneous Q4H Johnson, Clanford L,  MD   3 Units at 06/13/24 1218   ipratropium-albuterol  (DUONEB) 0.5-2.5 (3) MG/3ML nebulizer solution 3 mL  3 mL Nebulization Q6H PRN Arnulfo Larch, MD   3 mL at 06/13/24 0850   metroNIDAZOLE (FLAGYL) IVPB 500 mg  500 mg Intravenous Q12H Segars, Arlyce Lambert, MD       nicotine  (NICODERM CQ  - dosed in mg/24 hours) patch 14 mg  14 mg Transdermal Daily Segars, Jonathan, MD   14 mg at 06/13/24 0945   pantoprazole  (PROTONIX ) injection 40 mg  40 mg Intravenous Q12H Segars, Arlyce Lambert, MD   40 mg at 06/13/24 1300   potassium PHOSPHATE 30 mmol in dextrose  5 % 500 mL infusion  30 mmol Intravenous Once Johnson, Clanford L, MD 85 mL/hr at 06/13/24 0929 30 mmol at 06/13/24 0929   sodium bicarbonate 150 mEq in sterile water  1,150 mL infusion   Intravenous Continuous Lincoln Renshaw, Clanford L, MD 150 mL/hr at 06/13/24 0820 New Bag at 06/13/24 0820   sodium chloride  flush (NS) 0.9 % injection 3 mL  3 mL Intravenous Q12H Arnulfo Larch, MD   3 mL at 06/13/24 0821    Allergies as of 06/13/2024    (No Known Allergies)    Past Medical History:  Diagnosis Date   Arthritis    qwhere (02/22/2018)   Barrett's esophagus with esophagitis 03/26/2013   Confirmed via biopsy in 2011.  Subsequent EGD in 2014 and 2018 with biopsies consistent with reflux.  No evidence of Barrett's at that time.   Chronic abdominal pain    Chronic lower back pain    Chronic pancreatitis (HCC)    COPD (chronic obstructive pulmonary disease) (HCC)    Depression    Dyspnea    GERD (gastroesophageal reflux disease)    Heart murmur    Hiatal hernia    High cholesterol    Hypertension    Peptic ulcer    Pneumonia 2000s X 1   Stroke (HCC)    Tobacco use 03/26/2013    Past Surgical History:  Procedure Laterality Date   AORTIC ARCH ANGIOGRAPHY N/A 01/19/2018   Procedure: AORTIC ARCH ANGIOGRAPHY;  Surgeon: Richrd Char, MD;  [facility];  Service: Cardiovascular;  Laterality: N/A;   BIOPSY  04/17/2017   Procedure: BIOPSY;  Surgeon: Suzette Espy, MD;  [facility]: [facility];  Service: Endoscopy;;  esophageal   CARDIAC CATHETERIZATION     CAROTID-SUBCLAVIAN BYPASS GRAFT Left 03/19/2018   Procedure: BYPASS GRAFT CAROTID-SUBCLAVIAN;  Surgeon: Richrd Char, MD;  Location: [facility] OR;  Service: Vascular;  Laterality: Left;   CESAREAN SECTION  1985; 1988   COLONOSCOPY WITH PROPOFOL  N/A 04/17/2017   Procedure: COLONOSCOPY WITH PROPOFOL ;  Surgeon: Suzette Espy, MD;  [facility]: [facility];  Service: Endoscopy;  Laterality: N/A;  100   ESOPHAGOGASTRODUODENOSCOPY  Oct 2011   Dr. Homero Luster: ulcer in distal esophagus, soft stricture at GE junction s/p balloon dilation PATH: BARRETT'S   ESOPHAGOGASTRODUODENOSCOPY (EGD) WITH ESOPHAGEAL DILATION N/A 03/27/2013   Procedure: ESOPHAGOGASTRODUODENOSCOPY (EGD) WITH ESOPHAGEAL DILATION;  Surgeon: Suzette Espy, MD;  [facility];  Service: Endoscopy;  Laterality: N/A;  possible dilation   ESOPHAGOGASTRODUODENOSCOPY (EGD) WITH PROPOFOL  N/A  01/02/2017   Procedure: ESOPHAGOGASTRODUODENOSCOPY (EGD) WITH PROPOFOL ;  Surgeon: Suzette Espy, MD;  [facility];  Service: Endoscopy;  Laterality: N/A;  with possible esophageal dilation   ESOPHAGOGASTRODUODENOSCOPY (EGD) WITH PROPOFOL  N/A 04/17/2017   Procedure: ESOPHAGOGASTRODUODENOSCOPY (EGD) WITH PROPOFOL ;  Surgeon: Suzette Espy, MD;  [facility];  Service: Endoscopy;  Laterality: N/A;   EUS N/A 05/09/2013   Procedure: UPPER ENDOSCOPIC ULTRASOUND (EUS) LINEAR;  Surgeon: Janel Medford, MD;  Location: [facility]OS[facility];  Service: Endoscopy;  Laterality: N/A;   FRACTURE SURGERY     pt. denies   IR ANGIO INTRA EXTRACRAN SEL COM CAROTID INNOMINATE BILAT MOD SED  09/06/2022   IR ANGIO INTRA EXTRACRAN SEL COM CAROTID INNOMINATE BILAT MOD SED  09/13/2022   IR ANGIO VERTEBRAL SEL SUBCLAVIAN INNOMINATE BILAT MOD SED  09/13/2022   IR ANGIO VERTEBRAL SEL SUBCLAVIAN INNOMINATE UNI R MOD SED  09/06/2022   IR ANGIOGRAM FOLLOW UP STUDY  09/13/2022   IR CT HEAD LTD  09/08/2022   IR CT HEAD LTD  05/27/2024   IR CT HEAD LTD  05/27/2024   IR NEURO EACH ADD'L AFTER BASIC UNI LEFT (MS)  09/13/2022   IR PATIENT EVAL TECH 0-60 MINS  05/28/2024   IR PERCUTANEOUS ART THROMBECTOMY/INFUSION INTRACRANIAL INC DIAG ANGIO  05/27/2024   IR TRANSCATH/EMBOLIZ  09/08/2022   IR US  GUIDE VASC ACCESS RIGHT  09/08/2022   IR US  GUIDE VASC ACCESS RIGHT  09/06/2022   KNEE ARTHROSCOPY Right    LAPAROSCOPIC CHOLECYSTECTOMY     LEFT HEART CATH AND CORONARY ANGIOGRAPHY N/A 02/26/2018   Procedure: LEFT HEART CATH AND CORONARY ANGIOGRAPHY;  Surgeon: Odie Benne, MD;  [facility];  Service: Cardiovascular;  Laterality: N/A;   MALONEY DILATION N/A 04/17/2017   Procedure: Londa Rival DILATION;  Surgeon: Suzette Espy, MD;  [facility];  Service: Endoscopy;  Laterality: N/A;   PATELLA FRACTURE SURGERY Left    POLYPECTOMY  04/17/2017   Procedure: POLYPECTOMY;  Surgeon: Suzette Espy, MD;   [facility];  Service: Endoscopy;;   RADIOLOGY WITH ANESTHESIA N/A 09/08/2022   Procedure: L ICA Aneurysm Embolazation;  Surgeon: Luellen Sages, MD;  Location: [facility];  Service: Radiology;  Laterality: N/A;   RADIOLOGY WITH ANESTHESIA N/A 05/27/2024   Procedure: RADIOLOGY WITH ANESTHESIA;  Surgeon: Radiologist, Medication, MD;  Location: [facility];  Service: Radiology;  Laterality: N/A;   TEE WITHOUT CARDIOVERSION N/A 01/23/2019   Procedure: TRANSESOPHAGEAL ECHOCARDIOGRAM (TEE);  Surgeon: Hugh Madura, MD;  Location[facility];  Service: Cardiovascular;  Laterality: N/A;   TUBAL LIGATION  1992   UPPER EXTREMITY ANGIOGRAPHY N/A 01/19/2018   Procedure: UPPER EXTREMITY ANGIOGRAPHY;  Surgeon: Richrd Char, MD;  [facility];  Service: Cardiovascular;  Laterality: N/A;    Family History  Problem Relation Age of Onset   Asthma Mother    Heart failure Mother    Cancer Mother        pancreatic   Diabetes Mother    Hypertension Mother    Stroke Mother    Pancreatic cancer Mother        deceased   Heart failure Father    Diabetes Father    Colon cancer Neg Hx     Social History   Socioeconomic History   Marital status: Divorced    Spouse name: Not on file   Number of children: Not on file   Years of education: Not on file   Highest education level: Not on file  Occupational History   Occupation: farm  Tobacco Use   Smoking status: Every Day    Current packs/day: 0.50    Average packs/day: 0.5 packs/day for 41.7 years (20.9 ttl pk-yrs)    Types: Cigarettes  Start date: 09/26/1982   Smokeless tobacco: Never  Vaping Use   Vaping status: Former  Substance and Sexual Activity   Alcohol  use: No   Drug use: No   Sexual activity: Not Currently    Partners: Male    Birth control/protection: Surgical  Other Topics Concern   Not on file  Social History Narrative   Lives in [place], [place].   Is on disability, was a Visual merchandiser and drove tractors.     Wears seatbelt.   Cannot eat meat due to esophagus.   Smokes cigarettes.   Drinks sodas, 1/2 liter a day.    3 boys and 1 girl. 2 boys live in [place] . 1 son died in 46th month of pregnancy.   Live with friend and his wife at this time.   Used to be married and was abandoned, he would not give her a divorce.   Right handed      Social Drivers of Health   Financial Resource Strain: Not on file  Food Insecurity: Patient Unable To Answer (05/28/2024)   Hunger Vital Sign    Worried About Running Out of Food in the Last Year: Patient unable to answer    Ran Out of Food in the Last Year: Patient unable to answer  Transportation Needs: Patient Unable To Answer (05/28/2024)   PRAPARE - Transportation    Lack of Transportation (Medical): Patient unable to answer    Lack of Transportation (Non-Medical): Patient unable to answer  Physical Activity: Not on file  Stress: Not on file  Social Connections: Not on file  Intimate Partner Violence: Patient Unable To Answer (05/28/2024)   Humiliation, Afraid, Rape, and Kick questionnaire    Fear of Current or Ex-Partner: Patient unable to answer    Emotionally Abused: Patient unable to answer    Physically Abused: Patient unable to answer    Sexually Abused: Patient unable to answer     Review of System:  Limited history and accuracy questionable  General: Negative for anorexia, weight loss, fever, chills, fatigue, +weakness. Eyes: Negative for vision changes.  ENT: Negative for hoarseness, difficulty swallowing , nasal congestion. CV: Negative for chest pain, angina, palpitations, dyspnea on exertion, peripheral edema.  Respiratory: Negative for dyspnea at rest, dyspnea on exertion, cough, sputum, wheezing.  GI: See history of present illness. GU:  Negative for dysuria, hematuria, urinary incontinence, urinary frequency, nocturnal urination.  MS: Negative for joint pain, low back pain.  Derm: Negative for rash or itching.  Neuro: Negative for  weakness, abnormal sensation, seizure, frequent headaches, memory loss, +confusion.  Psych: Negative for anxiety, depression, suicidal ideation, hallucinations.  Endo: Negative for unusual weight change.  Heme: Negative for bruising or bleeding. Allergy: Negative for rash or hives.      Physical Examination:   Vital signs in last 24 hours: Temp:  [97.9 F (36.6 C)-99.7 F (37.6 C)] 98.4 F (36.9 C) (06/19 1200) Pulse Rate:  [107-135] 110 (06/19 1000) Resp:  [19-28] 22 (06/19 1000) BP: (92-123)/(35-81) 92/45 (06/19 1000) SpO2:  [93 %-100 %] 99 % (06/19 0900) Weight:  [98.6 kg] 98.6 kg (06/19 0222)    General: acutely ill appearing female. Resting. Easily arouses. Quickly falls back asleep. in no acute distress.  Head: Normocephalic, atraumatic.   Eyes: Conjunctiva pink, no icterus. Mouth: Oropharyngeal mucosa moist and pink  Neck: Supple without thyromegaly, masses, or lymphadenopathy.  Lungs: Clear to auscultation bilaterally.  Heart: Regular rate and rhythm, no murmurs rubs or gallops.  Abdomen: Bowel sounds are normal,  nontender, nondistended, no hepatosplenomegaly or masses, no abdominal bruits or hernia , no rebound or guarding.   Rectal: not performed Extremities: No lower extremity edema, clubbing, deformity.  Neuro: Alert and oriented x 4 , grossly normal neurologically.  Skin: Warm and dry, no rash or jaundice.   Psych: Alert and cooperative, normal mood and affect.        Intake/Output from previous day: No intake/output data recorded. Intake/Output this shift: Total I/O In: 197.2 [IV Piggyback:197.2] Out: 700 [Urine:700]  Lab Results:   CBC Recent Labs    06/13/24 0236  WBC 35.0*  HGB 13.1  HCT 39.3  MCV 88.9  PLT 944*   BMET Recent Labs    06/13/24 0236 06/13/24 0653 06/13/24 1141  NA 132* 137 137  K 3.0* 3.2* 3.2*  CL 97* 110 105  CO2 13* 16* 19*  GLUCOSE 515* 142* 198*  BUN 76* 90* 94*  CREATININE 2.65* 1.96* 1.75*  CALCIUM  8.3* 7.8* 8.0*    LFT Recent Labs    06/13/24 0236  BILITOT 0.6  ALKPHOS 78  AST 31  ALT 39  PROT 6.3*  ALBUMIN 3.1*    Lipase Recent Labs    06/13/24 0236  LIPASE 40   Lab Results  Component Value Date   IRON 73 06/13/2024   TIBC 245 (L) 06/13/2024   FERRITIN 50 06/13/2024   No results found for: VITAMINB12 No results found for: FOLATE   PT/INR No results for input(s): LABPROT, INR in the last 72 hours.   Hepatitis Panel No results for input(s): HEPBSAG, HCVAB, HEPAIGM, HEPBIGM in the last 72 hours.   Imaging Studies:   CT Renal Stone Study Result Date: 06/13/2024 CLINICAL DATA:  58 year old female with abdomen and flank pain. History of stroke. EXAM: CT ABDOMEN AND PELVIS WITHOUT CONTRAST TECHNIQUE: Multidetector CT imaging of the abdomen and pelvis was performed following the standard protocol without IV contrast. RADIATION DOSE REDUCTION: This exam was performed according to the departmental dose-optimization program which includes automated exposure control, adjustment of the mA and/or kV according to patient size and/or use of iterative reconstruction technique. COMPARISON:  CT Abdomen and Pelvis 01/20/2019. FINDINGS: Lower chest: Normal heart size. Negative lung bases. No pericardial or pleural effusion. Mildly thickened appearance of the distal thoracic esophagus, appears to be circumferential and is similar to the 2020 comparison (series 2, image 4). Hepatobiliary: Chronic cholecystectomy and hepatic steatosis. Pancreas: Partially atrophied since 2020. Spleen: Negative. Adrenals/Urinary Tract: Normal adrenal glands. Nonobstructed kidneys. No nephrolithiasis. Symmetric diminutive ureters. Diminutive bladder. Small pelvic phleboliths. Stomach/Bowel: Fluid-filled small and large bowel loops throughout the abdomen and pelvis, fluid in the large bowel to the rectum. No abnormally dilated loops. No transition point. Moderate fluid distended stomach also. No  pneumoperitoneum. No focal mesenteric inflammation identified. Normal appendix identified on coronal image 54. No free fluid. Vascular/Lymphatic: Aortoiliac calcified atherosclerosis. Normal caliber abdominal aorta. No lymphadenopathy. Reproductive: Negative noncontrast appearance. Other: No pelvis free fluid. Musculoskeletal: Chronic lower lumbar disc degeneration with vacuum disc. No acute osseous abnormality identified. IMPRESSION: 1. Fluid-filled bowel throughout the abdomen and pelvis suggesting Enteritis/Diarrhea. Normal appendix. No evidence of bowel obstruction or perforation. 2. This circumferentially thickened appearance of the distal esophagus, similar to a 2020 CT. Consider acute or chronic Esophagitis. 3. No other acute or inflammatory process identified in the noncontrast abdomen or pelvis. Chronic cholecystectomy and hepatic steatosis. Aortic Atherosclerosis (ICD10-I70.0). Electronically Signed   By: Marlise Simpers M.D.   On: 06/13/2024 05:09   DG Chest Portable 1  View Result Date: 06/13/2024 CLINICAL DATA:  Suffered a stroke last week. EXAM: PORTABLE CHEST 1 VIEW COMPARISON:  May 28, 2024 FINDINGS: The heart size and mediastinal contours are within normal limits. There is no evidence of acute infiltrate, pleural effusion or pneumothorax. The visualized skeletal structures are unremarkable. IMPRESSION: No active disease. Electronically Signed   By: Virgle Grime M.D.   On: 06/13/2024 03:22   CT Head Wo Contrast Result Date: 06/13/2024 EXAM: CT HEAD WITHOUT CONTRAST 06/13/2024 03:13:05 AM TECHNIQUE: CT of the head was performed without the administration of intravenous contrast. Automated exposure control, iterative reconstruction, and/or weight based adjustment of the mA/kV was utilized to reduce the radiation dose to as low as reasonably achievable. COMPARISON: MRI brain dated 05/28/2024. CLINICAL HISTORY: Mental status change, unknown cause. Pt is drowsy and only responding to name being called  at this time. Altered mental status. FINDINGS: BRAIN AND VENTRICLES: There is no acute intracranial hemorrhage, mass effect or midline shift. No abnormal extra-axial fluid collection. The gray-white differentiation is maintained without an acute infarct. There is no hydrocephalus. Old left PCA (posterior cerebral artery) distribution and MCA (middle cerebral artery) watershed infarcts. Old right cerebellar infarct. Global cortical atrophy. Subcortical and periventricular small vessel ischemic changes. ORBITS: The visualized portion of the orbits demonstrate no acute abnormality. SINUSES: The visualized paranasal sinuses and mastoid air cells demonstrate no acute abnormality. SOFT TISSUES AND SKULL: No acute abnormality of the visualized skull or soft tissues. VASCULATURE: Intracranial atherosclerosis. Left ICA (internal carotid artery) stent. IMPRESSION: 1. No acute intracranial abnormality. 2. Old left PCA distribution and MCA watershed infarcts. Old right cerebellar infarct. 3. Atrophy with small vessel ischemic changes. Electronically signed by: Zadie Herter MD 06/13/2024 03:18 AM EDT RP Workstation: [id number]   DG Swallowing Func-Speech Pathology Result Date: 05/31/2024 Table formatting from the original result was not included. Modified Barium Swallow Study Patient Details Name: TRYSTYN SITTS MRN: [id number] Date of Birth: 12/30/1965 Today's Date: 05/31/2024 HPI/PMH: HPI: MALKY RUDZINSKI is a 58 yo female presenting to [facility] with R sided weakness and aphasia. CTA showed occlusion of the L ICA, L vertebral artery and L subclavian artery occlusions. TNK administered and transferred to [facility] for thrombectomy. Remained intubated after the procedure, 6/2-6/4. MRI shows large L MCA ischemic infarct and nonhemorrhagic L lentiform nucleus and periventricular white matter infarcts. PMH includes HTN, HLD, prior CVA s/p L carotid subclavian bypass graft, COPD, tobacco use, GERD with Barrett's esophagus, chronic pancreatitis,  chronic opioid use, depression Clinical Impression: Clinical Impression: Swallow study was limited by reduced visibility at the level of the vocal folds and UES due to shadow from shoulder. Pt had difficulty following commands to adjust positioning and even when anatomy was more visible, she did not stay in that position long. Her oral phase was fairly appropriate with liquid and pureed boluses, with only trace residue along the tongue and base of tongue, and minimal interlabial escape, and slow lingual transit with purees. Incomplete velopharyngeal closure was observed with small amounts of boluses flowing upward between the soft palate and posterior pharyngeal wall, but then falling back down into her pharynx. Pharyngeally there is reduced movement of the hyoid, larynx, and epiglottis. Base of tongue retraction and pharyngeal squeeze are reduced. This impacts airway closure more than it does her efficiency. She has pretty consistent penetration across consistencies, with ejection of penetrates with purees (PAS 2) and a little frank penetration with nectar and honey (PAS 3), although with most of the barium clearing spontaneously. Pt  had intermittent coughing with thin liquids that was concerning for aspiration but could not be visualized, but when able to see the vocal folds, there were instances when a trace amount of thin liquids reached below them (PAS 7 or 8). Given current cognitive/communicative difficulties, she is not really appropriate for compensatory strategies at this time. Recommend that she advance to puree diet but with nectar (mildly) thick liquids. Factors that may increase risk of adverse event in presence of aspiration Roderick Civatte & Jessy Morocco 2021): Factors that may increase risk of adverse event in presence of aspiration Roderick Civatte & Jessy Morocco 2021): Reduced cognitive function; Dependence for feeding and/or oral hygiene Recommendations/Plan: Swallowing Evaluation Recommendations Swallowing Evaluation  Recommendations Recommendations: PO diet PO Diet Recommendation: Dysphagia 1 (Pureed); Mildly thick liquids (Level 2, nectar thick) Liquid Administration via: Cup; Straw Medication Administration: Crushed with puree Supervision: Staff to assist with self-feeding; Full supervision/cueing for swallowing strategies Swallowing strategies  : Minimize environmental distractions; Slow rate; Small bites/sips; Check for pocketing or oral holding; Check for anterior loss Postural changes: Position pt fully upright for meals Oral care recommendations: Oral care BID (2x/day) Caregiver Recommendations: Avoid jello, ice cream, thin soups, popsicles; Remove water  pitcher Treatment Plan Treatment Plan Treatment recommendations: Therapy as outlined in treatment plan below Follow-up recommendations: Acute inpatient rehab (3 hours/day) Functional status assessment: Patient has had a recent decline in their functional status and demonstrates the ability to make significant improvements in function in a reasonable and predictable amount of time. Treatment frequency: Min 2x/week Treatment duration: 2 weeks Interventions: Aspiration precaution training; Compensatory techniques; Patient/family education; Trials of upgraded texture/liquids; Diet toleration management by SLP Recommendations Recommendations for follow up therapy are one component of a multi-disciplinary discharge planning process, led by the attending physician.  Recommendations may be updated based on patient status, additional functional criteria and insurance authorization. Assessment: Orofacial Exam: Orofacial Exam Oral Cavity - Dentition: Edentulous Anatomy: No data recorded Boluses Administered: Boluses Administered Boluses Administered: Thin liquids (Level 0); Mildly thick liquids (Level 2, nectar thick); Moderately thick liquids (Level 3, honey thick); Puree  Oral Impairment Domain: Oral Impairment Domain Lip Closure: Interlabial escape, no progression to anterior lip  Tongue control during bolus hold: Posterior escape of greater than half of bolus Bolus preparation/mastication: -- (deferred given minimal mastication observed at bedside) Bolus transport/lingual motion: Slow tongue motion Oral residue: Trace residue lining oral structures Location of oral residue : Tongue Initiation of pharyngeal swallow : Pyriform sinuses  Pharyngeal Impairment Domain: Pharyngeal Impairment Domain Soft palate elevation: Trace column of contrast or air between SP and PW Laryngeal elevation: Partial superior movement of thyroid  cartilage/partial approximation of arytenoids to epiglottic petiole Anterior hyoid excursion: Partial anterior movement Epiglottic movement: Partial inversion Laryngeal vestibule closure: Incomplete, narrow column air/contrast in laryngeal vestibule Pharyngeal stripping wave : Present - diminished Pharyngeal contraction (A/P view only): N/A Pharyngoesophageal segment opening: Complete distension and complete duration, no obstruction of flow (as can be viewed - limited visibility) Tongue base retraction: Narrow column of contrast or air between tongue base and PPW Pharyngeal residue: Trace residue within or on pharyngeal structures Location of pharyngeal residue: Tongue base  Esophageal Impairment Domain: No data recorded Pill: No data recorded Penetration/Aspiration Scale Score: Penetration/Aspiration Scale Score 2.  Material enters airway, remains ABOVE vocal cords then ejected out: Puree 3.  Material enters airway, remains ABOVE vocal cords and not ejected out: Mildly thick liquids (Level 2, nectar thick); Moderately thick liquids (Level 3, honey thick) 8.  Material enters airway, passes BELOW cords without  attempt by patient to eject out (silent aspiration) : Thin liquids (Level 0) Compensatory Strategies: No data recorded  General Information: Caregiver present: No  Diet Prior to this Study: Full liquid diet; Thin liquids (Level 0)   Temperature : Normal   Respiratory  Status: WFL   Supplemental O2: None (Room air)   History of Recent Intubation: Yes  Behavior/Cognition: Alert; Cooperative; Requires cueing Self-Feeding Abilities: Needs assist with self-feeding Baseline vocal quality/speech: Not observed No data recorded No data recorded Exam Limitations: Limited visibility Goal Planning: Prognosis for improved oropharyngeal function: Good Barriers to Reach Goals: Cognitive deficits; Language deficits No data recorded Patient/Family Stated Goal: difficulty stating Consulted and agree with results and recommendations: Patient; Nurse; Dietitian Pain: Pain Assessment Pain Assessment: Faces Faces Pain Scale: 0 Facial Expression: 0 Body Movements: 0 Muscle Tension: 0 Compliance with ventilator (intubated pts.): N/A Vocalization (extubated pts.): 0 CPOT Total: 0 End of Session: Start Time:SLP Start Time (ACUTE ONLY): 1154 Stop Time: SLP Stop Time (ACUTE ONLY): 1205 Time Calculation:SLP Time Calculation (min) (ACUTE ONLY): 11 min Charges: SLP Evaluations $ SLP Speech Visit: 1 Visit SLP Evaluations $MBS Swallow: 1 Procedure $Swallowing Treatment: 1 Procedure SLP visit diagnosis: SLP Visit Diagnosis: Dysphagia, oropharyngeal phase (R13.12) Past Medical History: Past Medical History: Diagnosis Date  Arthritis   qwhere (02/22/2018)  Barrett's esophagus with esophagitis 03/26/2013  Chronic abdominal pain   Chronic lower back pain   Chronic pancreatitis (HCC)   COPD (chronic obstructive pulmonary disease) (HCC)   Depression   Dyspnea   GERD (gastroesophageal reflux disease)   Heart murmur   Hiatal hernia   High cholesterol   Hypertension   Peptic ulcer   Pneumonia 2000s X 1  Stroke (HCC)   Tobacco use 03/26/2013 Past Surgical History: Past Surgical History: Procedure Laterality Date  AORTIC ARCH ANGIOGRAPHY N/A 01/19/2018  Procedure: AORTIC ARCH ANGIOGRAPHY;  Surgeon: Richrd Char, MD;  [facility]: [facility];  Service: Cardiovascular;  Laterality: N/A;  BIOPSY  04/17/2017  Procedure:  BIOPSY;  Surgeon: Suzette Espy, MD;  [facility];  Service: Endoscopy;;  esophageal  CARDIAC CATHETERIZATION    CAROTID-SUBCLAVIAN BYPASS GRAFT Left 03/19/2018  Procedure: BYPASS GRAFT CAROTID-SUBCLAVIAN;  Surgeon: Richrd Char, MD;  Location: [facility] OR;  Service: Vascular;  Laterality: Left;  CESAREAN SECTION  1985; 1988  COLONOSCOPY WITH PROPOFOL  N/A 04/17/2017  Procedure: COLONOSCOPY WITH PROPOFOL ;  Surgeon: Suzette Espy, MD;  [facility]: [facility];  Service: Endoscopy;  Laterality: N/A;  100  ESOPHAGOGASTRODUODENOSCOPY  Oct 2011  Dr. Homero Luster: ulcer in distal esophagus, soft stricture at GE junction s/p balloon dilation PATH: BARRETT'S  ESOPHAGOGASTRODUODENOSCOPY (EGD) WITH ESOPHAGEAL DILATION N/A 03/27/2013  Procedure: ESOPHAGOGASTRODUODENOSCOPY (EGD) WITH ESOPHAGEAL DILATION;  Surgeon: Suzette Espy, MD;  [facility];  Service: Endoscopy;  Laterality: N/A;  possible dilation  ESOPHAGOGASTRODUODENOSCOPY (EGD) WITH PROPOFOL  N/A 01/02/2017  Procedure: ESOPHAGOGASTRODUODENOSCOPY (EGD) WITH PROPOFOL ;  Surgeon: Suzette Espy, MD;  [facility];  Service: Endoscopy;  Laterality: N/A;  with possible esophageal dilation  ESOPHAGOGASTRODUODENOSCOPY (EGD) WITH PROPOFOL  N/A 04/17/2017  Procedure: ESOPHAGOGASTRODUODENOSCOPY (EGD) WITH PROPOFOL ;  Surgeon: Suzette Espy, MD;  [facility];  Service: Endoscopy;  Laterality: N/A;  EUS N/A 05/09/2013  Procedure: UPPER ENDOSCOPIC ULTRASOUND (EUS) LINEAR;  Surgeon: Janel Medford, MD;  Location: [facility];  Service: Endoscopy;  Laterality: N/A;  FRACTURE SURGERY    pt. denies  IR ANGIO INTRA EXTRACRAN SEL COM CAROTID INNOMINATE BILAT MOD SED  09/06/2022  IR ANGIO INTRA EXTRACRAN SEL COM CAROTID INNOMINATE BILAT MOD SED  09/13/2022  IR ANGIO VERTEBRAL SEL SUBCLAVIAN INNOMINATE BILAT MOD SED  09/13/2022  IR ANGIO VERTEBRAL SEL SUBCLAVIAN INNOMINATE UNI R MOD SED  09/06/2022  IR ANGIOGRAM FOLLOW UP STUDY  09/13/2022  IR CT HEAD LTD   09/08/2022  IR CT HEAD LTD  05/27/2024  IR CT HEAD LTD  05/27/2024  IR NEURO EACH ADD'L AFTER BASIC UNI LEFT (MS)  09/13/2022  IR PATIENT EVAL TECH 0-60 MINS  05/28/2024  IR PERCUTANEOUS ART THROMBECTOMY/INFUSION INTRACRANIAL INC DIAG ANGIO  05/27/2024  IR TRANSCATH/EMBOLIZ  09/08/2022  IR US  GUIDE VASC ACCESS RIGHT  09/08/2022  IR US  GUIDE VASC ACCESS RIGHT  09/06/2022  KNEE ARTHROSCOPY Right   LAPAROSCOPIC CHOLECYSTECTOMY    LEFT HEART CATH AND CORONARY ANGIOGRAPHY N/A 02/26/2018  Procedure: LEFT HEART CATH AND CORONARY ANGIOGRAPHY;  Surgeon: Odie Benne, MD;  [facility];  Service: Cardiovascular;  Laterality: N/A;  MALONEY DILATION N/A 04/17/2017  Procedure: Londa Rival DILATION;  Surgeon: Suzette Espy, MD;  [facility];  Service: Endoscopy;  Laterality: N/A;  PATELLA FRACTURE SURGERY Left   POLYPECTOMY  04/17/2017  Procedure: POLYPECTOMY;  Surgeon: Suzette Espy, MD;  [facility];  Service: Endoscopy;;  RADIOLOGY WITH ANESTHESIA N/A 09/08/2022  Procedure: L ICA Aneurysm Embolazation;  Surgeon: Luellen Sages, MD;  Location: [facility];  Service: Radiology;  Laterality: N/A;  RADIOLOGY WITH ANESTHESIA N/A 05/27/2024  Procedure: RADIOLOGY WITH ANESTHESIA;  Surgeon: Radiologist, Medication, MD;  Location: [facility];  Service: Radiology;  Laterality: N/A;  TEE WITHOUT CARDIOVERSION N/A 01/23/2019  Procedure: TRANSESOPHAGEAL ECHOCARDIOGRAM (TEE);  Surgeon: Hugh Madura, MD;  Location: [facility] ENDOSCOPY;  Service: Cardiovascular;  Laterality: N/A;  TUBAL LIGATION  1992  UPPER EXTREMITY ANGIOGRAPHY N/A 01/19/2018  Procedure: UPPER EXTREMITY ANGIOGRAPHY;  Surgeon: Richrd Char, MD;  [facility];  Service: Cardiovascular;  Laterality: N/A; Beth Brooke., M.A. CCC-SLP Acute Rehabilitation Services Office: [phone] Secure chat preferred 05/31/2024, 1:20 PM  DG Abd Portable 1V Result Date: 05/29/2024 CLINICAL DATA:  Feeding tube placement. EXAM: PORTABLE ABDOMEN - 1 VIEW  COMPARISON:  May 27, 2024. FINDINGS: Distal tip of feeding tube is seen in expected position of distal stomach or proximal duodenum. IMPRESSION: Feeding tube tip seen in expected position of distal stomach or proximal duodenum. Electronically Signed   By: Rosalene Colon M.D.   On: 05/29/2024 13:53   IR PERCUTANEOUS ART THROMBECTOMY/INFUSION INTRACRANIAL INC DIAG ANGIO Result Date: 05/29/2024 INDICATION: Acute onset of left gaze deviation, right-sided hemiplegia, right-sided neglect, and aphasia. 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 EXAM: 1. EMERGENT LARGE VESSEL OCCLUSION THROMBOLYSIS (anterior CIRCULATION) COMPARISON:  CT angiogram of the head and neck of 05/27/2024. MEDICATIONS: No antibiotic was administered within 1 hour of the procedure. ANESTHESIA/SEDATION: General anesthesia. CONTRAST:  Omnipaque  300 approximately 100 mL. FLUOROSCOPY TIME:  Fluoroscopy Time: 23 minutes 4 seconds (1502 mGy). COMPLICATIONS: None immediate. TECHNIQUE: An emergent 2 physician consent was obtained in view of the non availability of a patient's next of kin or relatives physically or from the telephone numbers listed in the patient's chart. The patient was then put under general anesthesia by the Department of Anesthesiology at [facility]. The right groin was prepped and draped in the usual sterile fashion. Thereafter using modified Seldinger technique, transfemoral access into the right common femoral artery was obtained without  difficulty. Over an 0.035 inch guidewire an 8 French 25 cm Pinnacle sheath was inserted. Through this, and also over an 0.035 inch guidewire combination of a 125 cm 6 [place] Berenstein support catheter inside of an 088 100 cm Zoom support catheter was advanced to initially the common carotid artery and then to the distal left internal carotid artery at the cervical petrous junction. FINDINGS: The left common carotid  arteriogram demonstrates patency of the common carotid artery subclavian bypass supplying the left subclavian artery. The left common carotid artery bifurcation demonstrates the left external carotid artery and its major branches to be widely patent. 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 No distal opacification of the left middle cerebral artery or the left anterior cerebral artery is evident. PROCEDURE: Through the Zoom support catheter and the distal petrous segment, a combination of an 068 134 cm Penumbra aspiration catheter with a 160 cm 021 Trevo ProVue microcatheter was advanced over an 018 inch Aristotle micro guidewire with a moderate J configuration to the distal end of the 088 support catheter. The micro guidewire was then advanced through the occluded flow diverter device without difficulty into the M2 segment of the inferior division followed by the microcatheter followed by the Zoom 068 aspiration catheter through the pipeline flow diverter device into the left mid M1 segment. The microcatheter and micro guidewire were removed as aspiration was then applied at the hub of the 068 aspiration catheter and at the hub of the 088 support catheter which had been advanced just proximal to the pipeline flow diverter device. The 068 catheter was removed. A control arteriogram performed through the 088 support catheter demonstrated revascularization of the left anterior and left middle cerebral artery distributions. A TICI 2C revascularization was obtained. A control arteriogram through the support catheter revealed a moderate-sized filling defect in the supraclinoid left ICA, and also along the lower edge of the pipeline flow diverter device. A second pass was then made using a 120 cm 070 APRO  aspiration catheter which was advanced in combination with an 021 microcatheter over an 018 inch Aristotle micro guidewire. Access was obtained with a microcatheter and micro guidewire in the M2 region of an inferior division branch. The 070 APRO catheter was advanced into the proximal left M1 segment. Aspiration was then applied at the hub of the APRO aspiration catheter, and also the Zoom support catheter following removal of the microcatheter. After a minute and a half of aspiration, the combination was retrieved and removed. A control arteriogram performed through the Zoom support catheter demonstrated moderate improvement in the previously noted filling defects in the supraclinoid left ICA, and also along the lower portion of the flow diverter device. Spasm of the left M1 segment was treated with 3 aliquots of 25 mcg of Nitroglycerin  with complete relief. Over the next few minutes, decrease in the filling defect was noted within the pipeline flow diverter device. Also noted was decreased hemodynamic flow into the left MCA distribution. A follow-up biplane DSA now demonstrated progressively worsening flow through the left M1 distribution secondary to increasing filling defect within the supraclinoid left ICA, and in the proximal left MCA leading to complete occlusion of the left supraclinoid ICA and the proximal left M1 segment. A third pass was made again using a combination of the APRO 125 070 aspiration  catheter advanced again into the proximal M1 segment in conjunction with an 021 microcatheter and the micro guidewire. The micro guidewire and the microcatheter were removed. Aspiration was then applied for a minute and a half through the hub of the APRO intermediate catheter. This was then removed. A DSA through the support catheter now demonstrated improved flow through the supraclinoid left ICA and MCA distribution. Due to the aggressive platelet aggregation, it was decided to proceed with use of IV  cangrelor  infusion in order to prevent further platelet aggregation. Left femoral venous access was then obtained due to the patient having only 1 IV at the start of the procedure. Attempts to obtain access via a vein were unsuccessful in both arms. DSAs performed at 10 and 20 minutes post commencement of the IV cangrelor  demonstrated gradual clearing of filling defects in the supraclinoid left ICA, the pipeline flow diverter device, and the in the left MCA distribution. A final control arteriogram performed through the Zoom support catheter in the left internal artery now demonstrated complete revascularization of the left MCA distribution and of the supraclinoid left ICA achieving a TICI 3 revascularization. The left anterior cerebral artery remained widely patent. A flap panel CT obtained at the end of the procedure demonstrated no evidence of intracranial hemorrhage or mass effect. An 8 French Angio-Seal closure device was deployed for hemostasis at the right groin puncture site. The left 4 French sheath was left in position to continue the 4 hour IV cangrelor  infusion. Distal pulses remained present in both feet at the end of the procedure. The patient was left intubated due to her medical condition. She was then transferred to neuro ICU for post revascularization care. Medications: Aspirin  81 mg, and Brilinta  90 mg p.o. via a nasogastric tube after the first pass. IV half bolus dose of cangrelor , followed by a half dose infusion over 4 hours. CT scan of the brain to be obtained at the end of 4 hours. IMPRESSION: Status post revascularization of the occluded distal left internal carotid artery supraclinoid segment, and the proximal left middle cerebral M1 segment with 3 passes of contact aspiration achieving a TICI 3 revascularization. PLAN: As per Stroke Neurology. Electronically Signed   By: Luellen Sages M.D.   On: 05/29/2024 08:05   IR CT Head Ltd Result Date: 05/29/2024 INDICATION: Acute onset of  left gaze deviation, right-sided hemiplegia, right-sided neglect, and aphasia. 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 EXAM: 1. EMERGENT LARGE VESSEL OCCLUSION THROMBOLYSIS (anterior CIRCULATION) COMPARISON:  CT angiogram of the head and neck of 05/27/2024. MEDICATIONS: No antibiotic was administered within 1 hour of the procedure. ANESTHESIA/SEDATION: General anesthesia. CONTRAST:  Omnipaque  300 approximately 100 mL. FLUOROSCOPY TIME:  Fluoroscopy Time: 23 minutes 4 seconds (1502 mGy). COMPLICATIONS: None immediate. TECHNIQUE: An emergent 2 physician consent was obtained in view of the non availability of a patient's next of kin or relatives physically or from the telephone numbers listed in the patient's chart. The patient was then put under general anesthesia by the Department of Anesthesiology at [facility]. The right groin was prepped and draped in the usual sterile fashion. Thereafter using modified Seldinger technique, transfemoral access into the right common femoral artery was obtained without difficulty. Over an 0.035 inch guidewire an 8 French 25 cm Pinnacle sheath was inserted. Through this, and also over an 0.035 inch guidewire combination of a 125 cm 6 [place] Berenstein support catheter inside of an  088 100 cm Zoom support catheter was advanced to initially the common carotid artery and then to the distal left internal carotid artery at the cervical petrous junction. FINDINGS: The left common carotid arteriogram demonstrates patency of the common carotid artery subclavian bypass supplying the left subclavian artery. The left common carotid artery bifurcation demonstrates the left external carotid artery and its major branches to be widely patent. The left internal carotid artery at the bulb and distally demonstrates opacification with complete occlusion in the proximal cavernous segment at the site of the previously  positioned pipeline flow diverter used to treat a superior hypophyseal region aneurysm. No distal opacification of the left middle cerebral artery or the left anterior cerebral artery is evident. PROCEDURE: Through the Zoom support catheter and the distal petrous segment, a combination of an 068 134 cm Penumbra aspiration catheter with a 160 cm 021 Trevo ProVue microcatheter was advanced over an 018 inch Aristotle micro guidewire with a moderate J configuration to the distal end of the 088 support catheter. The micro guidewire was then advanced through the occluded flow diverter device without difficulty into the M2 segment of the inferior division followed by the microcatheter followed by the Zoom 068 aspiration catheter through the pipeline flow diverter device into the left mid M1 segment. The microcatheter and micro guidewire were removed as aspiration was then applied at the hub of the 068 aspiration catheter and at the hub of the 088 support catheter which had been advanced just proximal to the pipeline flow diverter device. The 068 catheter was removed. A control arteriogram performed through the 088 support catheter demonstrated revascularization of the left anterior and left middle cerebral artery distributions. A TICI 2C revascularization was obtained. A control arteriogram through the support catheter revealed a moderate-sized filling defect in the supraclinoid left ICA, and also along the lower edge of the pipeline flow diverter device. A second pass was then made using a 120 cm 070 APRO aspiration catheter which was advanced in combination with an 021 microcatheter over an 018 inch Aristotle micro guidewire. Access was obtained with a microcatheter and micro guidewire in the M2 region of an inferior division branch. The 070 APRO catheter was advanced into the proximal left M1 segment. Aspiration was then applied at the hub of the APRO aspiration catheter, and also the Zoom support catheter following  removal of the microcatheter. After a minute and a half of aspiration, the combination was retrieved and removed. A control arteriogram performed through the Zoom support catheter demonstrated moderate improvement in the previously noted filling defects in the supraclinoid left ICA, and also along the lower portion of the flow diverter device. Spasm of the left M1 segment was treated with 3 aliquots of 25 mcg of Nitroglycerin  with complete relief. Over the next few minutes, decrease in the filling defect was noted within the pipeline flow diverter device. Also noted was decreased hemodynamic flow into the left MCA distribution. A follow-up biplane DSA now demonstrated progressively worsening flow through the left M1 distribution secondary to increasing filling defect within the supraclinoid left ICA, and in the proximal left MCA leading to complete occlusion of the left supraclinoid ICA and the proximal left M1 segment. A third pass was made again using a combination of the APRO 125 070 aspiration catheter advanced again into the proximal M1 segment in conjunction with an 021 microcatheter and the micro guidewire. The micro guidewire and the microcatheter were removed. Aspiration was then applied for a minute and a half through  the hub of the APRO intermediate catheter. This was then removed. A DSA through the support catheter now demonstrated improved flow through the supraclinoid left ICA and MCA distribution. Due to the aggressive platelet aggregation, it was decided to proceed with use of IV cangrelor  infusion in order to prevent further platelet aggregation. Left femoral venous access was then obtained due to the patient having only 1 IV at the start of the procedure. Attempts to obtain access via a vein were unsuccessful in both arms. DSAs performed at 10 and 20 minutes post commencement of the IV cangrelor  demonstrated gradual clearing of filling defects in the supraclinoid left ICA, the pipeline flow  diverter device, and the in the left MCA distribution. A final control arteriogram performed through the Zoom support catheter in the left internal artery now demonstrated complete revascularization of the left MCA distribution and of the supraclinoid left ICA achieving a TICI 3 revascularization. The left anterior cerebral artery remained widely patent. A flap panel CT obtained at the end of the procedure demonstrated no evidence of intracranial hemorrhage or mass effect. An 8 French Angio-Seal closure device was deployed for hemostasis at the right groin puncture site. The left 4 French sheath was left in position to continue the 4 hour IV cangrelor  infusion. Distal pulses remained present in both feet at the end of the procedure. The patient was left intubated due to her medical condition. She was then transferred to neuro ICU for post revascularization care. Medications: Aspirin  81 mg, and Brilinta  90 mg p.o. via a nasogastric tube after the first pass. IV half bolus dose of cangrelor , followed by a half dose infusion over 4 hours. CT scan of the brain to be obtained at the end of 4 hours. IMPRESSION: Status post revascularization of the occluded distal left internal carotid artery supraclinoid segment, and the proximal left middle cerebral M1 segment with 3 passes of contact aspiration achieving a TICI 3 revascularization. PLAN: As per Stroke Neurology. Electronically Signed   By: Luellen Sages M.D.   On: 05/29/2024 08:05   IR CT Head Ltd Result Date: 05/29/2024 INDICATION: Acute onset of left gaze deviation, right-sided hemiplegia, right-sided neglect, and aphasia. 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 EXAM: 1. EMERGENT LARGE VESSEL OCCLUSION THROMBOLYSIS (anterior CIRCULATION) COMPARISON:  CT angiogram of the head and neck of 05/27/2024. MEDICATIONS: No antibiotic was administered within 1 hour of the procedure. ANESTHESIA/SEDATION:  General anesthesia. CONTRAST:  Omnipaque  300 approximately 100 mL. FLUOROSCOPY TIME:  Fluoroscopy Time: 23 minutes 4 seconds (1502 mGy). COMPLICATIONS: None immediate. TECHNIQUE: An emergent 2 physician consent was obtained in view of the non availability of a patient's next of kin or relatives physically or from the telephone numbers listed in the patient's chart. The patient was then put under general anesthesia by the Department of Anesthesiology at [facility]. The right groin was prepped and draped in the usual sterile fashion. Thereafter using modified Seldinger technique, transfemoral access into the right common femoral artery was obtained without difficulty. Over an 0.035 inch guidewire an 8 French 25 cm Pinnacle sheath was inserted. Through this, and also over an 0.035 inch guidewire combination of a 125 cm 6 [place] Berenstein support catheter inside of an 088 100 cm Zoom support catheter was advanced to initially the common carotid artery and then to the distal left internal carotid artery at the cervical petrous junction. FINDINGS: The left common carotid arteriogram demonstrates patency of  the common carotid artery subclavian bypass supplying the left subclavian artery. The left common carotid artery bifurcation demonstrates the left external carotid artery and its major branches to be widely patent. 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 No distal opacification of the left middle cerebral artery or the left anterior cerebral artery is evident. PROCEDURE: Through the Zoom support catheter and the distal petrous segment, a combination of an 068 134 cm Penumbra aspiration catheter with a 160 cm 021 Trevo ProVue microcatheter was advanced over an 018 inch Aristotle micro guidewire  with a moderate J configuration to the distal end of the 088 support catheter. The micro guidewire was then advanced through the occluded flow diverter device without difficulty into the M2 segment of the inferior division followed by the microcatheter followed by the Zoom 068 aspiration catheter through the pipeline flow diverter device into the left mid M1 segment. The microcatheter and micro guidewire were removed as aspiration was then applied at the hub of the 068 aspiration catheter and at the hub of the 088 support catheter which had been advanced just proximal to the pipeline flow diverter device. The 068 catheter was removed. A control arteriogram performed through the 088 support catheter demonstrated revascularization of the left anterior and left middle cerebral artery distributions. A TICI 2C revascularization was obtained. A control arteriogram through the support catheter revealed a moderate-sized filling defect in the supraclinoid left ICA, and also along the lower edge of the pipeline flow diverter device. A second pass was then made using a 120 cm 070 APRO aspiration catheter which was advanced in combination with an 021 microcatheter over an 018 inch Aristotle micro guidewire. Access was obtained with a microcatheter and micro guidewire in the M2 region of an inferior division branch. The 070 APRO catheter was advanced into the proximal left M1 segment. Aspiration was then applied at the hub of the APRO aspiration catheter, and also the Zoom support catheter following removal of the microcatheter. After a minute and a half of aspiration, the combination was retrieved and removed. A control arteriogram performed through the Zoom support catheter demonstrated moderate improvement in the previously noted filling defects in the supraclinoid left ICA, and also along the lower portion of the flow diverter device. Spasm of the left M1 segment was treated with 3 aliquots of 25 mcg of Nitroglycerin  with  complete relief. Over the next few minutes, decrease in the filling defect was noted within the pipeline flow diverter device. Also noted was decreased hemodynamic flow into the left MCA distribution. A follow-up biplane DSA now demonstrated progressively worsening flow through the left M1 distribution secondary to increasing filling defect within the supraclinoid left ICA, and in the proximal left MCA leading to complete occlusion of the left supraclinoid ICA and the proximal left M1 segment. A third pass was made again using a combination of the APRO 125 070 aspiration catheter advanced again into the proximal M1 segment in conjunction with an 021 microcatheter and the micro guidewire. The micro guidewire and the microcatheter were removed. Aspiration was then applied for a minute and a half through the hub of the APRO intermediate catheter. This was then removed. A DSA through the support catheter now demonstrated improved flow through the supraclinoid left ICA and MCA distribution. Due to the aggressive platelet aggregation, it was  decided to proceed with use of IV cangrelor  infusion in order to prevent further platelet aggregation. Left femoral venous access was then obtained due to the patient having only 1 IV at the start of the procedure. Attempts to obtain access via a vein were unsuccessful in both arms. DSAs performed at 10 and 20 minutes post commencement of the IV cangrelor  demonstrated gradual clearing of filling defects in the supraclinoid left ICA, the pipeline flow diverter device, and the in the left MCA distribution. A final control arteriogram performed through the Zoom support catheter in the left internal artery now demonstrated complete revascularization of the left MCA distribution and of the supraclinoid left ICA achieving a TICI 3 revascularization. The left anterior cerebral artery remained widely patent. A flap panel CT obtained at the end of the procedure demonstrated no evidence of  intracranial hemorrhage or mass effect. An 8 French Angio-Seal closure device was deployed for hemostasis at the right groin puncture site. The left 4 French sheath was left in position to continue the 4 hour IV cangrelor  infusion. Distal pulses remained present in both feet at the end of the procedure. The patient was left intubated due to her medical condition. She was then transferred to neuro ICU for post revascularization care. Medications: Aspirin  81 mg, and Brilinta  90 mg p.o. via a nasogastric tube after the first pass. IV half bolus dose of cangrelor , followed by a half dose infusion over 4 hours. CT scan of the brain to be obtained at the end of 4 hours. IMPRESSION: Status post revascularization of the occluded distal left internal carotid artery supraclinoid segment, and the proximal left middle cerebral M1 segment with 3 passes of contact aspiration achieving a TICI 3 revascularization. PLAN: As per Stroke Neurology. Electronically Signed   By: Luellen Sages M.D.   On: 05/29/2024 08:05   MR BRAIN WO CONTRAST Result Date: 05/29/2024 EXAM: MRI BRAIN WITHOUT CONTRAST 05/28/2024 04:35:37 PM TECHNIQUE: Multiplanar multisequence MRI of the head/brain was performed without the administration of intravenous contrast. COMPARISON: CT head without contrast 05/28/2024. MR head without contrast 05/19/2023. CLINICAL HISTORY: Stroke, follow up; 24 hrs post-TNK r/o hemorrhagic conversion. Revascularization of occluded left internal carotid artery 05/27/2024. FINDINGS: BRAIN AND VENTRICLES: A large acute nonhemorrhagic left MCA territory ischemic infarct is present. Acute nonhemorrhagic infarct is present in the posterior left lentiform nucleus. Acute nonhemorrhagic left periventricular acute white matter infarcts are present. No acute hemorrhage is present. Remote cortical infarcts are present in the left parietal lobe and along the watershed distribution over the left hemisphere. Remote inferior cerebellar  infarcts are present, right greater than left. No mass. No midline shift. No hydrocephalus. The sella is unremarkable. Normal flow voids. ORBITS: No acute abnormality. SINUSES AND MASTOIDS: Fluid in the nasopharynx is likely secondary to intubation. Moderate mucosal disease is present in the sphenoid sinus bilaterally and posterior maxillary sinuses. Mild mucosal thickening is present in the anterior ethmoid air cells and inferior frontal sinuses. BONES AND SOFT TISSUES: Normal marrow signal. No acute soft tissue abnormality. IMPRESSION: 1. No acute hemorrhage. 2. Large acute nonhemorrhagic left MCA territory ischemic infarct. 3. Acute nonhemorrhagic infarct in the posterior left lentiform nucleus. 4. Acute nonhemorrhagic left periventricular white matter infarcts. 5. Remote cortical infarcts in the left parietal lobe and along the watershed distribution over the left hemisphere. Remote inferior cerebellar infarcts, right greater than left. 6. Moderate mucosal disease in the sphenoid sinus bilaterally and posterior maxillary sinuses. Mild mucosal thickening in the anterior ethmoid air cells and inferior  frontal sinuses. The pertinent results were texted to Dr. Murvin Arthurs via the [facility] system at 5:22am. Electronically signed by: Audree Leas MD 05/29/2024 05:23 AM EDT RP Workstation: [id number]   ECHOCARDIOGRAM COMPLETE BUBBLE STUDY Result Date: 05/28/2024    ECHOCARDIOGRAM REPORT   Patient Name:   BAMBI FEHNEL Date of Exam: 05/28/2024 Medical Rec #:  [id number]     Height:       67.0 in Accession #:    [id number]    Weight:       224.9 lb Date of Birth:  12-27-1965     BSA:          2.126 m Patient Age:    57 years      BP:           120/61 mmHg Patient Gender: F             HR:           86 bpm. Exam Location:  Inpatient Procedure: 2D Echo, Color Doppler and Cardiac Doppler (Both Spectral and Color            Flow Doppler were utilized during procedure). Indications:    Stroke  History:        Patient has  prior history of Echocardiogram examinations, most                 recent 03/31/2023. Risk Factors:Hypertension.  Sonographer:    Jeralene Mom Referring Phys: [id number] Alphonza Ashing STACK IMPRESSIONS  1. Left ventricular ejection fraction, by estimation, is 65 to 70%. The left ventricle has hyperdynamic function. The left ventricle has no regional wall motion abnormalities. Left ventricular diastolic parameters are consistent with Grade I diastolic dysfunction (impaired relaxation).  2. Right ventricular systolic function is normal. The right ventricular size is normal. There is mildly elevated pulmonary artery systolic pressure. The estimated right ventricular systolic pressure is 37.1 mmHg.  3. The mitral valve is normal in structure. No evidence of mitral valve regurgitation. No evidence of mitral stenosis.  4. Increased aortic valve velocities are at least in part secondary to hyperdynamic state/increased cardiac output. The aortic valve is tricuspid. There is mild thickening of the aortic valve. Aortic valve regurgitation is not visualized. Aortic valve sclerosis is present, with no evidence of aortic valve stenosis.  5. Agitated saline contrast bubble study was negative, with no evidence of any interatrial shunt. FINDINGS  Left Ventricle: Left ventricular ejection fraction, by estimation, is 65 to 70%. The left ventricle has hyperdynamic function. The left ventricle has no regional wall motion abnormalities. The left ventricular internal cavity size was small. There is no  left ventricular hypertrophy. Left ventricular diastolic parameters are consistent with Grade I diastolic dysfunction (impaired relaxation). Indeterminate filling pressures. Right Ventricle: The right ventricular size is normal. Right vetricular wall thickness was not well visualized. Right ventricular systolic function is normal. There is mildly elevated pulmonary artery systolic pressure. The tricuspid regurgitant velocity  is 2.92 m/s, and  with an assumed right atrial pressure of 3 mmHg, the estimated right ventricular systolic pressure is 37.1 mmHg. Left Atrium: Left atrial size was normal in size. Right Atrium: Right atrial size was normal in size. Pericardium: There is no evidence of pericardial effusion. Mitral Valve: The mitral valve is normal in structure. No evidence of mitral valve regurgitation. No evidence of mitral valve stenosis. Tricuspid Valve: The tricuspid valve is normal in structure. Tricuspid valve regurgitation is trivial. Aortic Valve: Increased aortic valve velocities are at least in  part secondary to hyperdynamic state/increased cardiac output. The aortic valve is tricuspid. There is mild thickening of the aortic valve. Aortic valve regurgitation is not visualized. Aortic valve sclerosis is present, with no evidence of aortic valve stenosis. Aortic valve mean gradient measures 15.7 mmHg. Aortic valve peak gradient measures 30.5 mmHg. Aortic valve area, by VTI measures 2.42 cm. Pulmonic Valve: The pulmonic valve was grossly normal. Pulmonic valve regurgitation is not visualized. No evidence of pulmonic stenosis. Aorta: The aortic root is normal in size and structure. IAS/Shunts: No atrial level shunt detected by color flow Doppler. Agitated saline contrast bubble study was negative, with no evidence of any interatrial shunt.  LEFT VENTRICLE PLAX 2D LVIDd:         4.80 cm   Diastology LVIDs:         3.00 cm   LV e' medial:    7.29 cm/s LV PW:         0.90 cm   LV E/e' medial:  12.5 LV IVS:        1.10 cm   LV e' lateral:   9.90 cm/s LVOT diam:     2.20 cm   LV E/e' lateral: 9.2 LV SV:         113 LV SV Index:   53 LVOT Area:     3.80 cm  LEFT ATRIUM           Index LA diam:      3.80 cm 1.79 cm/m LA Vol (A4C): 45.4 ml 21.36 ml/m  AORTIC VALVE AV Area (Vmax):    2.36 cm AV Area (Vmean):   2.39 cm AV Area (VTI):     2.42 cm AV Vmax:           276.00 cm/s AV Vmean:          180.000 cm/s AV VTI:            0.469 m AV Peak Grad:       30.5 mmHg AV Mean Grad:      15.7 mmHg LVOT Vmax:         171.00 cm/s LVOT Vmean:        113.000 cm/s LVOT VTI:          0.298 m LVOT/AV VTI ratio: 0.64  AORTA Ao Root diam: 2.90 cm MITRAL VALVE                TRICUSPID VALVE MV Area (PHT): 3.31 cm     TR Peak grad:   34.1 mmHg MV Decel Time: 229 msec     TR Vmax:        292.00 cm/s MV E velocity: 91.30 cm/s MV A velocity: 122.00 cm/s  SHUNTS MV E/A ratio:  0.75         Systemic VTI:  0.30 m                             Systemic Diam: 2.20 cm Karyl Paget Croitoru MD Electronically signed by Luana Rumple MD Signature Date/Time: 05/28/2024/1:10:16 PM    Final    IR PATIENT EVAL TECH 0-60 MINS Result Date: 05/28/2024 Cleotis Daily     05/28/2024 10:23 AM Left 4 french venous femoral sheath removed at 1000. Manual pressure applied to obtain hemostasis at 1010. Dressing applied and site reviewed with [facility]. No acute complications. Distal pulses intact.   Portable Chest xray Result Date: 05/28/2024 CLINICAL DATA:  Status post  ET tube placement. EXAM: PORTABLE CHEST 1 VIEW COMPARISON:  05/27/2024 FINDINGS: The endotracheal tube tip is 5.5 cm above the carina. Enteric tube courses below the GE junction. Stable cardiomediastinal contours. Mild subsegmental atelectasis in the left midlung and medial right lung base. No significant pleural effusion, interstitial edema or airspace disease. No pneumothorax identified. Visualized osseous structures appear intact. IMPRESSION: 1. Satisfactory position of endotracheal tube. 2. Mild subsegmental atelectasis in the left midlung and medial right lung base. Electronically Signed   By: Kimberley Penman M.D.   On: 05/28/2024 06:08   CT HEAD WO CONTRAST ( ) Result Date: 05/28/2024 EXAM: CT HEAD WITHOUT 05/28/2024 05:25:22 AM TECHNIQUE: CT of the head was performed without the administration of intravenous contrast. Automated exposure control, iterative reconstruction, and/or weight based adjustment of the mA/kV was utilized to  reduce the radiation dose to as low as reasonably achievable. COMPARISON: CT head without contrast 05/27/2024. CT head without contrast 05/19/2023. CLINICAL HISTORY: Status post revascularization of occluded left ICA. Stroke, follow up. FINDINGS: BRAIN AND VENTRICLES: Remote left occipital and posterior watershed infarcts are stable. Remote cerebellar infarcts are again noted, more prominent on the right. There is no acute intracranial hemorrhage, mass effect or midline shift. No abnormal extra-axial fluid collection. The gray-white differentiation is maintained without an acute infarct. There is no hydrocephalus. ORBITS: The visualized portion of the orbits demonstrate no acute abnormality. SINUSES: Mild mucosal thickening is present in the sphenoid sinuses bilaterally. No fluid levels are present. Mild mucosal thickening is present in the anterior ethmoid air cells and inferior frontal sinuses. The mastoid air cells are clear. SOFT TISSUES AND SKULL: The patient is intubated. OG tube is in place. No acute abnormality of the visualized skull or soft tissues. IMPRESSION: 1. Stable remote left occipital and posterior watershed infarcts. 2. Stable remote cerebellar infarcts, more prominent on the right. Electronically signed by: Audree Leas MD 05/28/2024 05:47 AM EDT RP Workstation: [id number]   CT ANGIO HEAD NECK W WO CM Result Date: 05/28/2024 EXAM: CTA HEAD AND NECK WITH AND WITHOUT 05/28/2024 05:25:22 AM TECHNIQUE: CTA of the head and neck was performed with and without the administration of intravenous contrast. Multiplanar 2D and/or 3D reformatted images are provided for review. Automated exposure control, iterative reconstruction, and/or weight based adjustment of the mA/kV was utilized to reduce the radiation dose to as low as reasonably achievable. Stenosis of the internal carotid arteries measured using NASCET criteria. COMPARISON: CT head without contrast 05/28/2024 at 05:19 am and CT angio head  and neck 05/27/2024 CLINICAL HISTORY: Neuro deficit, acute, stroke suspected. Status post revascularization of occluded flow diverter stent in the left internal carotid artery. Revascularization of left M1 segment. FINDINGS: CTA NECK: AORTIC ARCH AND ARCH VESSELS: Mild atherosclerotic calcifications are again noted at the aortic arch. The great vessel origins are widely patent. CAROTID ARTERIES: Atherosclerotic changes are present at the right carotid bifurcation without significant stenosis. Mild mural plaque is present in the left common carotid artery and bifurcation without significant stenosis or change. The left internal carotid artery is revascularized. VERTEBRAL ARTERIES: The proximal left vertebral artery is occluded. The vessel is reconstituted in the distal left v2 segment. High-grade stenosis and occlusion of the right vertebral artery at the v3 segment is noted. The left vertebral artery is occluded just below the dural margin, stable. The distal vertebral arteries are reconstituted. SOFT TISSUES: The lung apices are clear. No cervical or superior mediastinal lymphadenopathy. The larynx and pharynx are unremarkable. No acute abnormality of the salivary  and thyroid  glands. BONES: No acute osseous abnormality. CTA HEAD: ANTERIOR CIRCULATION: Atherosclerotic calcifications are again noted in the supraclinoid right ICA without significant stenosis through the ICA terminus. The left cavernous ICA stent is patent. The A1 and M1 segments are patent. Mild proximal left M1 segment narrowing is present. The ACA and MCA branch vessels opacify normally on both sides. POSTERIOR CIRCULATION: The basilar artery is small. Prominent iliac vessels are again noted. The superior cerebellar arteries are patent bilaterally, with posterior cerebral arteries originating from the basilar tip. Moderate attenuation of distal PCA branch vessels is present bilaterally, worse on the right. OTHER: No dural venous sinus thrombosis on  this non-dedicated study. BRAIN: There is no acute infarct or acute intracranial hemorrhage. No mass effect or midline shift. IMPRESSION: 1. Status post revascularization of occluded flow diverter stent in the left internal carotid artery and revascularization of left M1 segment. Mild proximal left M1 segment narrowing. 2. High-grade stenosis and occlusion of the right vertebral artery at the V3 segment, stable. 3. Mild mural plaque in the left common carotid artery and bifurcation without significant stenosis or change. 4. Atherosclerotic changes at the right carotid bifurcation without significant stenosis. 5. Occlusion of the left vertebral artery just below the dural margin, stable. 6. Moderate attenuation of distal PCA branch vessels bilaterally, worse on the right. Electronically signed by: Audree Leas MD 05/28/2024 05:44 AM EDT RP Workstation: [id number]   CT HEAD POST STROKE FOLLOWUP/TIMED/STAT READ Result Date: 05/27/2024 CLINICAL DATA:  Code stroke.  Neuro deficit, acute, stroke suspected EXAM: CT HEAD WITHOUT CONTRAST TECHNIQUE: Contiguous axial images were obtained from the base of the skull through the vertex without intravenous contrast. RADIATION DOSE REDUCTION: This exam was performed according to the departmental dose-optimization program which includes automated exposure control, adjustment of the mA and/or kV according to patient size and/or use of iterative reconstruction technique. COMPARISON:  CT head May 27, 2024. FINDINGS: Brain: Similar remote infarcts in the left cerebral hemisphere and cerebellum. No evidence of acute large vascular territory infarct, acute hemorrhage, mass lesion, midline shift or hydrocephalus. Vascular: Calcific atherosclerosis. Skull: No acute fracture. Sinuses/Orbits: Paranasal sinus mucosal thickening. No acute orbital findings. ASPECTS [facility] Stroke Program Early CT Score) Total score (0-10 with 10 being normal): 10. IMPRESSION: 1. No evidence of acute  intracranial abnormality.  ASPECTS is 10. 2. Similar remote infarcts. Electronically Signed   By: Stevenson Elbe M.D.   On: 05/27/2024 23:04   DG Abd 1 View Result Date: 05/27/2024 CLINICAL DATA:  OG tube placement EXAM: ABDOMEN - 1 VIEW COMPARISON:  01/20/2019 FINDINGS: Limited field of view for tube placement verification purposes. An enteric tube is present with tip projecting over the right upper quadrant consistent location of the distal stomach or proximal duodenum. Visualized bowel gas pattern is normal. Mild perihilar infiltration is suggested in the lungs. Surgical clip in the right upper quadrant. Residual contrast material in the urinary tract. IMPRESSION: Enteric tube tip projects over the right upper quadrant consistent with location in the distal stomach or proximal duodenum. Electronically Signed   By: Boyce Byes M.D.   On: 05/27/2024 20:24   Portable Chest x-ray Result Date: 05/27/2024 CLINICAL DATA:  Endotracheal tube.  OG tube placement. EXAM: PORTABLE CHEST 1 VIEW COMPARISON:  CT chest 09/02/2022 FINDINGS: Endotracheal tube is present with tip measuring 4.2 cm above the carina. Enteric tube is present. Tip is off the field of view but below the left hemidiaphragm. Shallow inspiration. Cardiac enlargement. Perihilar infiltrates, greater on the  left. This may represent edema or pneumonia. No pleural effusion or pneumothorax. Patient is rotated towards the right. IMPRESSION: 1. Appliances appear in satisfactory position. 2. Cardiac enlargement. 3. Perihilar infiltrates, greater on the left, possibly pneumonia or edema. Electronically Signed   By: Boyce Byes M.D.   On: 05/27/2024 20:23   CT ANGIO HEAD NECK W WO CM (CODE STROKE) Result Date: 05/27/2024 CLINICAL DATA:  Neuro deficit, concern for stroke, right-sided weakness. EXAM: CT ANGIOGRAPHY HEAD AND NECK WITH AND WITHOUT CONTRAST TECHNIQUE: Multidetector CT imaging of the head and neck was performed using the standard protocol  during bolus administration of intravenous contrast. Multiplanar CT image reconstructions and MIPs were obtained to evaluate the vascular anatomy. Carotid stenosis measurements (when applicable) are obtained utilizing NASCET criteria, using the distal internal carotid diameter as the denominator. RADIATION DOSE REDUCTION: This exam was performed according to the departmental dose-optimization program which includes automated exposure control, adjustment of the mA and/or kV according to patient size and/or use of iterative reconstruction technique. CONTRAST:  75mL OMNIPAQUE  IOHEXOL  350 MG/ML SOLN COMPARISON:  Same day CT head.  CTA head and neck 03/31/2023. FINDINGS: CTA NECK FINDINGS Aortic arch: Standard configuration of the aortic arch. Imaged portion shows no evidence of aneurysm or dissection. Atherosclerosis of the visualized aortic arch involving the origins of the aortic arch vessels. There is prominent noncalcified atherosclerotic plaque involving the proximal left subclavian artery. Additional noncalcified atherosclerotic plaque involving the brachiocephalic artery origin resulting in mild stenosis. Pulmonary arteries: As permitted by contrast timing, there are no filling defects in the visualized pulmonary arteries. Subclavian arteries: Similar focal occlusion of the proximal left subclavian artery with reconstitution of the distal left subclavian. Right subclavian arteries patent. Right carotid system: Patent from the origin to the skull base. Atherosclerosis at the carotid bifurcation without stenosis greater than 50%. No evidence of dissection. Left carotid system: Patent from the origin to the carotid bifurcation. Multifocal atherosclerosis of the common carotid artery. Redemonstrated left common carotid to left subclavian artery bypass which appears patent. There is noncalcified atherosclerotic plaque at the carotid bifurcation. The external carotid artery branches are patent. There is abrupt  occlusion of the proximal cervical ICA. Vertebral arteries: The right vertebral artery is patent from the origin to the V3 segment which is significantly diminished in caliber similar to prior. There is severely diminished caliber and intraluminal contrast within the intracranial vertebral artery. The left vertebral artery demonstrates intermittent contrast opacification primarily within the V2 segment. The left vertebral artery origins not well visualized. The left V3 segment is patent with poor contrast opacification of the left V4 segment similar to prior. Skeleton: No acute or aggressive finding noted. Other neck: The visualized airway is patent. No cervical lymphadenopathy. Upper chest: Visualized lung apices are clear. Review of the MIP images confirms the above findings CTA HEAD FINDINGS ANTERIOR CIRCULATION: The left internal carotid artery is occluded from the cervical segment to the distal supraclinoid ICA. There is reconstitution at the ICA terminus likely via the circle-of-Willis. The right internal carotid artery is patent to the ICA terminus. Atherosclerosis of the right carotid siphon with moderate stenosis of the supraclinoid ICA. MCAs: Patent bilaterally. Mild narrowing at the origin of the left M1 segment similar to prior. Moderate stenosis of a proximal M2 inferior division branch of the left MCA. ACAs: Patent bilaterally. Moderate narrowing of the left A2 segment. POSTERIOR CIRCULATION: Small caliber basilar artery similar to prior without evidence of focal occlusion. There is poor contrast opacification of both  PCAs similar to prior. The SCA is are visualized proximally. Venous sinuses: As permitted by contrast timing, patent. Anatomic variants: None Review of the MIP images confirms the above findings IMPRESSION: Occlusion of the left internal carotid artery from the proximal cervical segment to the ICA terminus likely secondary to noncalcified atherosclerotic plaque near the carotid  bifurcation. Reconstitution of the ICA terminus via the circle-of-Willis. Multifocal atherosclerosis in the neck. Redemonstrated multifocal occlusion of the left vertebral artery in the neck. Poor contrast opacification of the V3 and V4 segments bilaterally. Similar occlusion of the proximal left subclavian artery with patent left common carotid to left subclavian bypass noted. Intracranial atherosclerotic disease as above. Moderate stenosis of the right supraclinoid ICA. Focal moderate stenosis of a proximal M2 branch of the left MCA. Moderate stenosis of the A2 segment left ACA. These results were called by telephone at the time of interpretation on 05/27/2024 at 4:15 pm to provider Dr. Liam Redhead, who verbally acknowledged these results. Electronically Signed   By: Denny Flack M.D.   On: 05/27/2024 16:16   CT HEAD CODE STROKE WO CONTRAST Result Date: 05/27/2024 CLINICAL DATA:  Code stroke. Neuro deficit, concern for stroke, right-sided weakness. EXAM: CT HEAD WITHOUT CONTRAST TECHNIQUE: Contiguous axial images were obtained from the base of the skull through the vertex without intravenous contrast. RADIATION DOSE REDUCTION: This exam was performed according to the departmental dose-optimization program which includes automated exposure control, adjustment of the mA and/or kV according to patient size and/or use of iterative reconstruction technique. COMPARISON:  CT head and CTA head and neck 03/31/2023. FINDINGS: Brain: No acute intracranial hemorrhage. Similar appearance of encephalomalacia throughout the right cerebral hemisphere particularly in the frontoparietal lobes and superior left occipital lobe. Additional remote infarcts in the bilateral cerebellum. Nonspecific hypoattenuation in the periventricular and subcortical white matter favored to reflect chronic microvascular ischemic changes. No edema, mass effect, or midline shift. The basilar cisterns are patent. Ventricles: Slight ex vacuo dilatation of  the right lateral ventricle. No hydrocephalus. Vascular: Atherosclerosis in the right carotid siphon. Redemonstrated stent in the left carotid siphon. Skull: No acute or aggressive finding. Orbits: Orbits are symmetric. Sinuses: Mucosal thickening in the ethmoid sinuses. Other: Mastoid air cells are clear. ASPECTS [facility] Stroke Program Early CT Score) - Ganglionic level infarction (caudate, lentiform nuclei, internal capsule, insula, M1-M3 cortex): 7 - Supraganglionic infarction (M4-M6 cortex): 3 Total score (0-10 with 10 being normal): 10 IMPRESSION: 1. No CT evidence of acute intracranial abnormality. 2. Similar remote infarcts in the left cerebral hemisphere and bilateral cerebellum. Chronic microvascular ischemic changes. 3. ASPECTS is 10 These results were called by telephone at the time of interpretation on 05/27/2024 at 3:22 pm to provider Dorenda Gandy , who verbally acknowledged these results. Electronically Signed   By: Denny Flack M.D.   On: 05/27/2024 15:22  [4 week]  Assessment:   58 yo female with global aphasia, recent admission 6/2-6/11 with L MCA infarct secondary to left distal ICA stent thrombosis (initial placement 08/2022) and M1 occlusion s/p TNK and Neuro IR thrombectomy, complicated by R hemiparesis, global aphasia, dysphagia. Additional history of HTN, HLD, substance use disorder (cocaine), smoking, Barrett's esophagus (remote pathology, not seen on subsequent EGDs), h/o remote pancreatitis (neg EUS 2014), COPD who was brought in by EMS from SNF after found with decreased responsiveness on toilet, appeared to have melena and dark material around mouth, possible coffee ground emesis. On admission found to have severe sepsis, suspected GI vs urinary source, possible GI bleeding, early/developing  DKA, AKI, acute myocardial injury, and encephalopathy. GI consulted for coffee ground emesis on DAPT (ASA and Ticagrelor ). [facility] pharmacist noted per [facility], patient actually receiving Plavix  BID  not Ticagrelor .  Coffee ground emesis/melena: in setting of DAPT after recent left distal ICA stent thrombosis and M1 occlusion s/p TNK and thrombectomy. -noted prior to presentation at 1-2am. None since admission. -receiving Plavix  BID as outlined, instead of Ticagrelor . -Hgb this morning normal. Anticipate some decline with hydration -appears she has UGI bleed, differential includes esophagitis, ulcer, AVMs, malignancy. On CT she has mild circumferential esophageal wall thickening of distal esophagus, but similar to 2020 study  Sepsis:  -likely urinary source although CT shows fluid filled bowel suspicious for enteritis/diarrhea state. No reported diarrhea per SNF or boyfriend. No stool since admission.  -if develops diarrhea, recommend stool studies  Elevated Troponin: Trop 65-->122  Early/developing DKA:  Managed per attending   Plan:   Monitor for overt GI bleeding.  Recheck CBC in AM or sooner if active bleeding noted.  IV PPI BID.  Continue DAPT unless significant bleeding.  Given recent stroke and current acute illness, we will hold off on EGD unless she has significant decline in Hgb or has overt GI bleeding. Risks outweigh the benefits at this time. We will continue to monitor with you.    LOS: 0 days   We would like to thank you for the opportunity to participate in the care of Deasiah W Rockford.  Trudie Fuse. Verlie Glisson [facility] [phone] 6/19/20251:10 PM

## 2024-06-13 NOTE — TOC Initial Note (Signed)
 Transition of Care [facility]) - Initial/Assessment Note    Patient Details  Name: Rita [name] MRN: [id number] Date of Birth: 04-01-1966  Transition of Care [facility]) CM/SW Contact:    Orelia Binet, RN Phone Number: 06/13/2024, 12:40 PM  Clinical Narrative:           Patient admitted with sepsis. Recently had a long admission at [facility], recommended for CIR , family wanted [facility]. Patient admitted to SNF on 6/11 due to not enough support at home. PT eval pending. TOC following to start Auth. DC planning for 3-4 days.      Expected Discharge Plan: Skilled Nursing Facility Barriers to Discharge: Continued Medical Work up   Patient Goals and CMS Choice Patient states their goals for this hospitalization and ongoing recovery are:: Return to SNF CMS Medicare.gov Compare Post Acute Care list provided to:: Patient Represenative (must comment) Choice offered to / list presented to : Adult Children      Expected Discharge Plan and Services     Post Acute Care Choice: Skilled Nursing Facility Living arrangements for the past 2 months: Single Family Home                         Prior Living Arrangements/Services Living arrangements for the past 2 months: Single Family Home      Current home services: DME       Orientation: : Oriented to Self Alcohol  / Substance Use: Illicit Drugs Psych Involvement: No (comment)  Admission diagnosis:  Dehydration [E86.0] Hyperglycemia [R73.9] Sepsis (HCC) [A41.9] Acute renal failure, unspecified acute renal failure type (HCC) [N17.9] Altered mental status, unspecified altered mental status type [R41.82] Lactic acidemia [E87.20] Patient Active Problem List   Diagnosis Date Noted   Sepsis (HCC) 06/13/2024   Upper GI bleed 06/13/2024   AKI (acute kidney injury) (HCC) 06/13/2024   Elevated troponin 06/13/2024   Acute encephalopathy 06/13/2024   Lactic acidosis 06/13/2024   Middle cerebral artery embolism, left 05/27/2024   Acute hypoxic  respiratory failure (HCC) 05/27/2024   Acute CVA (cerebrovascular accident) (HCC) 03/31/2023   Internal carotid artery stenosis, left 09/08/2022   Opioid dependence (HCC) 09/02/2022   Obesity, Class II, BMI 35-39.9 09/02/2022   Lung mass 09/02/2022   Impaired glucose tolerance 09/02/2022   Acute ischemic stroke (HCC) 09/01/2022   Mixed hyperlipidemia 09/01/2022   Cavitating mass in right lower lung lobe (PNA Vs Cancer) 01/21/2019   Gastroenteritis 01/20/2019   Left sided cerebral hemisphere cerebrovascular accident (CVA) (HCC) 01/20/2019   Stenosis of left subclavian artery (HCC) 03/19/2018   Chest pain 02/21/2018   Left subclavian artery occlusion 02/21/2018   Rectal bleeding 03/21/2017   Hiatal hernia with gastroesophageal reflux disease and esophagitis    Biliary pain    Pain of upper abdomen    Elevated liver enzymes    Chronic abdominal pain    Pancreatitis 02/06/2015   Acute pancreatitis    Elevated LFTs    Pancreatitis, acute 05/09/2013   Hypokalemia 03/27/2013   Anemia 03/27/2013   Esophageal dysphagia 03/26/2013   Chronic back pain 03/26/2013   GERD (gastroesophageal reflux disease) 03/26/2013   Tobacco use 03/26/2013   Barrett's esophagus with esophagitis 03/26/2013   Elevated lipase 08/20/2011   PCP:  [facility]:   [facility] [phone] - [place], [place] - [address] AT [facility] [address] [place] [address] Phone: [phone] Fax: [phone]     Social Drivers  of Health (SDOH) Social History: SDOH Screenings   Food Insecurity: Patient Unable To Answer (05/28/2024)  Housing: Patient Unable To Answer (05/28/2024)  Transportation Needs: Patient Unable To Answer (05/28/2024)  Utilities: Patient Unable To Answer (05/28/2024)  Tobacco Use: High Risk (06/13/2024)   SDOH Interventions:     Readmission Risk Interventions    06/13/2024   12:39 PM  Readmission Risk Prevention Plan  Transportation  Screening Complete  PCP or Specialist Appt within 3-5 Days Not Complete  HRI or Home Care Consult Complete  Social Work Consult for Recovery Care Planning/Counseling Complete  Palliative Care Screening Not Applicable  Medication Review Oceanographer) Complete

## 2024-06-13 NOTE — ED Provider Notes (Signed)
 [place] EMERGENCY DEPARTMENT AT [facility] Provider Note   CSN: [id number] Arrival date & time: 06/13/24  8657     Patient presents with: Hematemesis   Rita [name] is a 58 y.o. female.   59 yo F w/ recent cva on brilinta , plavix  and asa, DM on insulin , HLD and HTN who presents to the ED secondary to AMS. Apparently was ok around 2100. When they checked on her this evening she had black stuff coming out of her mouth and was vomiting, not acting her self so sent here for eval. W/ EMS she was tachycardic, normotensive, normoxic, no active emesis. Able to walk with significant assistance to the stretcher.         Prior to Admission medications   Medication Sig Start Date End Date Taking? Authorizing Provider  amLODipine  (NORVASC ) 5 MG tablet Take 1 tablet (5 mg total) by mouth daily. 06/06/24   Audrene Lease, NP  arformoterol  (BROVANA ) 15 MCG/2ML NEBU Take 2 mLs (15 mcg total) by nebulization 2 (two) times daily. 06/05/24   Lehner, Erin C, NP  aspirin  81 MG chewable tablet Chew 1 tablet (81 mg total) by mouth daily. 06/06/24   Lehner, Erin C, NP  atorvastatin  (LIPITOR ) 80 MG tablet Take 1 tablet (80 mg total) by mouth daily at 6 PM. Patient taking differently: Take 80 mg by mouth at bedtime. 04/01/23 05/28/24  Mason Sole, Pratik D, DO  budesonide  (PULMICORT ) 0.5 MG/2ML nebulizer solution Take 2 mLs (0.5 mg total) by nebulization 2 (two) times daily. 06/05/24   Lehner, Erin C, NP  Buprenorphine  HCl-Naloxone  HCl 8-2 MG FILM Take 1 Film by mouth 3 (three) times daily. 04/01/23   Mason Sole, Pratik D, DO  docusate sodium  (COLACE) 100 MG capsule Take 1 capsule (100 mg total) by mouth 2 (two) times daily as needed for mild constipation. 06/05/24   Audrene Lease, NP  ezetimibe  (ZETIA ) 10 MG tablet Take 1 tablet (10 mg total) by mouth daily. 04/01/23 05/28/24  Mason Sole, Pratik D, DO  insulin  aspart (NOVOLOG ) 100 UNIT/ML injection Inject 0-9 Units into the skin every 4 (four) hours. 06/05/24   Lehner, Erin C,  NP  ipratropium-albuterol  (DUONEB) 0.5-2.5 (3) MG/3ML SOLN Take 3 mLs by nebulization every 4 (four) hours as needed. 06/05/24   Lehner, Erin C, NP  nicotine  (NICODERM CQ  - DOSED IN MG/24 HOURS) 14 mg/24hr patch Place 1 patch (14 mg total) onto the skin daily. 06/06/24   Audrene Lease, NP  pantoprazole  (PROTONIX ) 40 MG tablet Take 1 tablet (40 mg total) by mouth daily. 06/06/24   Lehner, Erin C, NP  polyethylene glycol (MIRALAX  / GLYCOLAX ) 17 g packet Take 17 g by mouth daily as needed for moderate constipation. 06/05/24   Audrene Lease, NP  ticagrelor  (BRILINTA ) 90 MG TABS tablet Take 1 tablet (90 mg total) by mouth 2 (two) times daily. 06/05/24   Lehner, Erin C, NP    Allergies: Patient has no known allergies.    Review of Systems  Updated Vital Signs BP (!) 113/59   Pulse (!) 118   Temp 99.7 F (37.6 C) (Rectal)   Resp (!) 25   Ht 5' 7 (1.702 m)   Wt 98.6 kg   LMP 02/09/2012   SpO2 96%   BMI 34.05 kg/m   Physical Exam Vitals and nursing note reviewed.  Constitutional:      Appearance: She is well-developed.  HENT:     Head: Normocephalic and atraumatic.  Mouth/Throat:     Mouth: Mucous membranes are dry.     Comments: Black liquid dried on mouth  Cardiovascular:     Rate and Rhythm: Regular rhythm. Tachycardia present.  Pulmonary:     Effort: Tachypnea present. No respiratory distress.     Breath sounds: No stridor.     Comments: Appears to be Kussmaul breathing Abdominal:     General: There is no distension.   Musculoskeletal:     Cervical back: Normal range of motion.   Skin:    General: Skin is warm and dry.   Neurological:     Mental Status: She is alert.     Comments: Wont move her right side, left side can move feet on command    (all labs ordered are listed, but only abnormal results are displayed) Labs Reviewed  CBC WITH DIFFERENTIAL/PLATELET - Abnormal; Notable for the following components:      Result Value   WBC 35.0 (*)    Platelets 944 (*)     Neutro Abs 31.9 (*)    Monocytes Absolute 1.3 (*)    Abs Immature Granulocytes 0.63 (*)    All other components within normal limits  COMPREHENSIVE METABOLIC PANEL WITH GFR - Abnormal; Notable for the following components:   Sodium 132 (*)    Potassium 3.0 (*)    Chloride 97 (*)    CO2 13 (*)    Glucose, Bld 515 (*)    BUN 76 (*)    Creatinine, Ser 2.65 (*)    Calcium  8.3 (*)    Total Protein 6.3 (*)    Albumin 3.1 (*)    GFR, Estimated 20 (*)    Anion gap 22 (*)    All other components within normal limits  URINALYSIS, ROUTINE W REFLEX MICROSCOPIC - Abnormal; Notable for the following components:   APPearance CLOUDY (*)    Glucose, UA 50 (*)    Protein, ur 100 (*)    Leukocytes,Ua MODERATE (*)    Bacteria, UA FEW (*)    Non Squamous Epithelial 0-5 (*)    All other components within normal limits  LACTIC ACID, PLASMA - Abnormal; Notable for the following components:   Lactic Acid, Venous 6.9 (*)    All other components within normal limits  BETA-HYDROXYBUTYRIC ACID - Abnormal; Notable for the following components:   Beta-Hydroxybutyric Acid 0.44 (*)    All other components within normal limits  BLOOD GAS, VENOUS - Abnormal; Notable for the following components:   pCO2, Ven 22 (*)    pO2, Ven 105 (*)    Bicarbonate 13.0 (*)    Acid-base deficit 10.4 (*)    All other components within normal limits  CBG MONITORING, ED - Abnormal; Notable for the following components:   Glucose-Capillary 528 (*)    All other components within normal limits  CBG MONITORING, ED - Abnormal; Notable for the following components:   Glucose-Capillary 240 (*)    All other components within normal limits  CBG MONITORING, ED - Abnormal; Notable for the following components:   Glucose-Capillary 163 (*)    All other components within normal limits  TROPONIN I (HIGH SENSITIVITY) - Abnormal; Notable for the following components:   Troponin I (High Sensitivity) 65 (*)    All other components within  normal limits  CULTURE, BLOOD (ROUTINE X 2)  CULTURE, BLOOD (ROUTINE X 2)  URINE CULTURE  LIPASE, BLOOD  LACTIC ACID, PLASMA  TYPE AND SCREEN  TROPONIN I (HIGH SENSITIVITY)  EKG: None  Radiology: CT Renal Stone Study Result Date: 06/13/2024 CLINICAL DATA:  58 year old female with abdomen and flank pain. History of stroke. EXAM: CT ABDOMEN AND PELVIS WITHOUT CONTRAST TECHNIQUE: Multidetector CT imaging of the abdomen and pelvis was performed following the standard protocol without IV contrast. RADIATION DOSE REDUCTION: This exam was performed according to the departmental dose-optimization program which includes automated exposure control, adjustment of the mA and/or kV according to patient size and/or use of iterative reconstruction technique. COMPARISON:  CT Abdomen and Pelvis 01/20/2019. FINDINGS: Lower chest: Normal heart size. Negative lung bases. No pericardial or pleural effusion. Mildly thickened appearance of the distal thoracic esophagus, appears to be circumferential and is similar to the 2020 comparison (series 2, image 4). Hepatobiliary: Chronic cholecystectomy and hepatic steatosis. Pancreas: Partially atrophied since 2020. Spleen: Negative. Adrenals/Urinary Tract: Normal adrenal glands. Nonobstructed kidneys. No nephrolithiasis. Symmetric diminutive ureters. Diminutive bladder. Small pelvic phleboliths. Stomach/Bowel: Fluid-filled small and large bowel loops throughout the abdomen and pelvis, fluid in the large bowel to the rectum. No abnormally dilated loops. No transition point. Moderate fluid distended stomach also. No pneumoperitoneum. No focal mesenteric inflammation identified. Normal appendix identified on coronal image 54. No free fluid. Vascular/Lymphatic: Aortoiliac calcified atherosclerosis. Normal caliber abdominal aorta. No lymphadenopathy. Reproductive: Negative noncontrast appearance. Other: No pelvis free fluid. Musculoskeletal: Chronic lower lumbar disc degeneration  with vacuum disc. No acute osseous abnormality identified. IMPRESSION: 1. Fluid-filled bowel throughout the abdomen and pelvis suggesting Enteritis/Diarrhea. Normal appendix. No evidence of bowel obstruction or perforation. 2. This circumferentially thickened appearance of the distal esophagus, similar to a 2020 CT. Consider acute or chronic Esophagitis. 3. No other acute or inflammatory process identified in the noncontrast abdomen or pelvis. Chronic cholecystectomy and hepatic steatosis. Aortic Atherosclerosis (ICD10-I70.0). Electronically Signed   By: Marlise Simpers M.D.   On: 06/13/2024 05:09   DG Chest Portable 1 View Result Date: 06/13/2024 CLINICAL DATA:  Suffered a stroke last week. EXAM: PORTABLE CHEST 1 VIEW COMPARISON:  May 28, 2024 FINDINGS: The heart size and mediastinal contours are within normal limits. There is no evidence of acute infiltrate, pleural effusion or pneumothorax. The visualized skeletal structures are unremarkable. IMPRESSION: No active disease. Electronically Signed   By: Virgle Grime M.D.   On: 06/13/2024 03:22   CT Head Wo Contrast Result Date: 06/13/2024 EXAM: CT HEAD WITHOUT CONTRAST 06/13/2024 03:13:05 AM TECHNIQUE: CT of the head was performed without the administration of intravenous contrast. Automated exposure control, iterative reconstruction, and/or weight based adjustment of the mA/kV was utilized to reduce the radiation dose to as low as reasonably achievable. COMPARISON: MRI brain dated 05/28/2024. CLINICAL HISTORY: Mental status change, unknown cause. Pt is drowsy and only responding to name being called at this time. Altered mental status. FINDINGS: BRAIN AND VENTRICLES: There is no acute intracranial hemorrhage, mass effect or midline shift. No abnormal extra-axial fluid collection. The gray-white differentiation is maintained without an acute infarct. There is no hydrocephalus. Old left PCA (posterior cerebral artery) distribution and MCA (middle cerebral artery)  watershed infarcts. Old right cerebellar infarct. Global cortical atrophy. Subcortical and periventricular small vessel ischemic changes. ORBITS: The visualized portion of the orbits demonstrate no acute abnormality. SINUSES: The visualized paranasal sinuses and mastoid air cells demonstrate no acute abnormality. SOFT TISSUES AND SKULL: No acute abnormality of the visualized skull or soft tissues. VASCULATURE: Intracranial atherosclerosis. Left ICA (internal carotid artery) stent. IMPRESSION: 1. No acute intracranial abnormality. 2. Old left PCA distribution and MCA watershed infarcts. Old right cerebellar infarct.  3. Atrophy with small vessel ischemic changes. Electronically signed by: Zadie Herter MD 06/13/2024 03:18 AM EDT RP Workstation: [id number]     .Critical Care  Performed by: Eve Hinders, MD Authorized by: Eve Hinders, MD   Critical care provider statement:    Critical care time (minutes):  45   Critical care was necessary to treat or prevent imminent or life-threatening deterioration of the following conditions:  Renal failure, dehydration, endocrine crisis and metabolic crisis   Critical care was time spent personally by me on the following activities:  Development of treatment plan with patient or surrogate, discussions with consultants, evaluation of patient's response to treatment, examination of patient, ordering and review of laboratory studies, ordering and review of radiographic studies, ordering and performing treatments and interventions, pulse oximetry, re-evaluation of patient's condition and review of old charts    Medications Ordered in the ED  insulin  regular, human (MYXREDLIN) 100 units/ 100 mL infusion (1.8 Units/hr Intravenous Rate/Dose Change 06/13/24 0538)  lactated ringers  infusion (has no administration in time range)  dextrose  5 % in lactated ringers  infusion ( Intravenous New Bag/Given 06/13/24 0430)  dextrose  50 % solution 0-50 mL (has no administration in  time range)  potassium chloride  10 mEq in 100 mL IVPB (10 mEq Intravenous New Bag/Given 06/13/24 0445)  vancomycin (VANCOREADY) IVPB 2000 mg/400 mL (has no administration in time range)  metroNIDAZOLE (FLAGYL) IVPB 500 mg (has no administration in time range)  cefTRIAXone  (ROCEPHIN ) 2 g in sodium chloride  0.9 % 100 mL IVPB (0 g Intravenous Stopped 06/13/24 0327)  lactated ringers  bolus 1,000 mL (0 mLs Intravenous Stopped 06/13/24 0430)  ondansetron  (ZOFRAN ) injection 4 mg (4 mg Intravenous Given 06/13/24 0227)  pantoprazole  (PROTONIX ) injection 40 mg (40 mg Intravenous Given 06/13/24 0227)  insulin  aspart (novoLOG ) injection 10 Units (10 Units Intravenous Given 06/13/24 0233)  lactated ringers  bolus 1,972 mL (1,972 mLs Intravenous New Bag/Given 06/13/24 0354)                                    Medical Decision Making Amount and/or Complexity of Data Reviewed Labs: ordered. Radiology: ordered.  Risk OTC drugs. Prescription drug management. Decision regarding hospitalization.  Initial concern for GIB +/- DKA. Will check labs for same. MOST form wants all interventions aside form intubation.  Cbg >500 - will add on beta hydroxy, vbg. Lr bolus, insulin , protonix  ordered.  Labs w/ AKI, lactic acidemia, elevated anion gap but VBG without acidosis (combo LA and early dka?), fluids bing given. HR improving. MS improving. Ct done to eval for sources of GI bleed or otherwise causing symptoms.  HR improving, CBG improving, tachycardia improving. MS stable, will d/w TRH for admission.      Final diagnoses:  Dehydration  Acute renal failure, unspecified acute renal failure type (HCC)  Lactic acidemia  Hyperglycemia  Altered mental status, unspecified altered mental status type    ED Discharge Orders     None          Jonluke Cobbins, Reymundo Caulk, MD 06/13/24 [phone]

## 2024-06-14 ENCOUNTER — Inpatient Hospital Stay (HOSPITAL_COMMUNITY)

## 2024-06-14 DIAGNOSIS — Z66 Do not resuscitate: Secondary | ICD-10-CM

## 2024-06-14 DIAGNOSIS — A419 Sepsis, unspecified organism: Secondary | ICD-10-CM | POA: Diagnosis not present

## 2024-06-14 DIAGNOSIS — R933 Abnormal findings on diagnostic imaging of other parts of digestive tract: Secondary | ICD-10-CM | POA: Diagnosis not present

## 2024-06-14 DIAGNOSIS — F1120 Opioid dependence, uncomplicated: Secondary | ICD-10-CM | POA: Diagnosis not present

## 2024-06-14 DIAGNOSIS — E860 Dehydration: Secondary | ICD-10-CM | POA: Diagnosis not present

## 2024-06-14 DIAGNOSIS — K920 Hematemesis: Secondary | ICD-10-CM | POA: Diagnosis not present

## 2024-06-14 DIAGNOSIS — R710 Precipitous drop in hematocrit: Secondary | ICD-10-CM

## 2024-06-14 LAB — BASIC METABOLIC PANEL WITH GFR
Anion gap: 13 (ref 5–15)
BUN: 65 mg/dL — ABNORMAL HIGH (ref 6–20)
CO2: 30 mmol/L (ref 22–32)
Calcium: 7.7 mg/dL — ABNORMAL LOW (ref 8.9–10.3)
Chloride: 99 mmol/L (ref 98–111)
Creatinine, Ser: 1.22 mg/dL — ABNORMAL HIGH (ref 0.44–1.00)
GFR, Estimated: 51 mL/min — ABNORMAL LOW (ref >60)
Glucose, Bld: 160 mg/dL — ABNORMAL HIGH (ref 70–99)
Potassium: 2.8 mmol/L — ABNORMAL LOW (ref 3.5–5.1)
Sodium: 142 mmol/L (ref 135–145)

## 2024-06-14 LAB — CBC
HCT: 20.9 % — ABNORMAL LOW (ref 36.0–46.0)
Hemoglobin: 7.2 g/dL — ABNORMAL LOW (ref 12.0–15.0)
MCH: 30.8 pg (ref 26.0–34.0)
MCHC: 34.4 g/dL (ref 30.0–36.0)
MCV: 89.3 fL (ref 80.0–100.0)
Platelets: 470 10*3/uL — ABNORMAL HIGH (ref 150–400)
RBC: 2.34 MIL/uL — ABNORMAL LOW (ref 3.87–5.11)
RDW: 14.4 % (ref 11.5–15.5)
WBC: 18.2 10*3/uL — ABNORMAL HIGH (ref 4.0–10.5)
nRBC: 0 % (ref 0.0–0.2)

## 2024-06-14 LAB — PREPARE RBC (CROSSMATCH)

## 2024-06-14 LAB — ABO/RH: ABO/RH(D): O POS

## 2024-06-14 LAB — PHOSPHORUS: Phosphorus: 3 mg/dL (ref 2.5–4.6)

## 2024-06-14 LAB — HEMOGLOBIN AND HEMATOCRIT, BLOOD
HCT: 23.8 % — ABNORMAL LOW (ref 36.0–46.0)
HCT: 19 % — ABNORMAL LOW (ref 36.0–46.0)
Hemoglobin: 8.1 g/dL — ABNORMAL LOW (ref 12.0–15.0)
Hemoglobin: 6.5 g/dL — CL (ref 12.0–15.0)

## 2024-06-14 LAB — GLUCOSE, CAPILLARY
Glucose-Capillary: 107 mg/dL — ABNORMAL HIGH (ref 70–99)
Glucose-Capillary: 124 mg/dL — ABNORMAL HIGH (ref 70–99)
Glucose-Capillary: 135 mg/dL — ABNORMAL HIGH (ref 70–99)
Glucose-Capillary: 153 mg/dL — ABNORMAL HIGH (ref 70–99)
Glucose-Capillary: 160 mg/dL — ABNORMAL HIGH (ref 70–99)
Glucose-Capillary: 163 mg/dL — ABNORMAL HIGH (ref 70–99)
Glucose-Capillary: 174 mg/dL — ABNORMAL HIGH (ref 70–99)

## 2024-06-14 LAB — MAGNESIUM: Magnesium: 1.9 mg/dL (ref 1.7–2.4)

## 2024-06-14 LAB — PROCALCITONIN: Procalcitonin: 1.99 ng/mL

## 2024-06-14 MED ORDER — ENSURE PLUS HIGH PROTEIN PO LIQD
237.0000 mL | Freq: Two times a day (BID) | ORAL | Status: DC
  Administered 2024-06-15 – 2024-06-16 (×3): 237 mL via ORAL

## 2024-06-14 MED ORDER — LACTATED RINGERS IV BOLUS
1000.0000 mL | Freq: Once | INTRAVENOUS | Status: AC
  Administered 2024-06-14: 1000 mL via INTRAVENOUS

## 2024-06-14 MED ORDER — MAGNESIUM SULFATE 2 GM/50ML IV SOLN
2.0000 g | Freq: Once | INTRAVENOUS | Status: AC
  Administered 2024-06-14: 2 g via INTRAVENOUS
  Filled 2024-06-14: qty 50

## 2024-06-14 MED ORDER — LACTATED RINGERS IV SOLN: INTRAVENOUS | Status: DC

## 2024-06-14 MED ORDER — METRONIDAZOLE 500 MG/100ML IV SOLN
500.0000 mg | Freq: Two times a day (BID) | INTRAVENOUS | Status: DC
  Administered 2024-06-14 – 2024-06-15 (×2): 500 mg via INTRAVENOUS
  Filled 2024-06-14 (×2): qty 100

## 2024-06-14 MED ORDER — SODIUM CHLORIDE 0.9 % IV SOLN
2.0000 g | INTRAVENOUS | Status: DC
  Administered 2024-06-15: 2 g via INTRAVENOUS
  Filled 2024-06-14: qty 20

## 2024-06-14 MED ORDER — POTASSIUM CHLORIDE CRYS ER 20 MEQ PO TBCR
40.0000 meq | EXTENDED_RELEASE_TABLET | Freq: Once | ORAL | Status: AC
  Administered 2024-06-14: 40 meq via ORAL
  Filled 2024-06-14: qty 2

## 2024-06-14 MED ORDER — POTASSIUM CHLORIDE 10 MEQ/100ML IV SOLN
10.0000 meq | INTRAVENOUS | Status: AC
  Administered 2024-06-14 (×6): 10 meq via INTRAVENOUS
  Filled 2024-06-14 (×6): qty 100

## 2024-06-14 MED ORDER — MIDODRINE HCL 5 MG PO TABS
5.0000 mg | ORAL_TABLET | Freq: Three times a day (TID) | ORAL | Status: DC
  Administered 2024-06-14 – 2024-06-16 (×5): 5 mg via ORAL
  Filled 2024-06-14 (×5): qty 1

## 2024-06-14 MED ORDER — SODIUM CHLORIDE 0.9% IV SOLUTION: Freq: Once | INTRAVENOUS | Status: DC

## 2024-06-14 MED ORDER — IOHEXOL 350 MG/ML SOLN
100.0000 mL | Freq: Once | INTRAVENOUS | Status: AC | PRN
  Administered 2024-06-14: 100 mL via INTRAVENOUS

## 2024-06-14 NOTE — TOC Progression Note (Signed)
 Transition of Care [facility]) - Progression Note    Patient Details  Name: Rita [name] MRN: [id number] Date of Birth: 1966/09/09  Transition of Care [facility]) CM/SW Contact  Ander Katos, [place] Phone Number: 06/14/2024, 10:47 AM  Clinical Narrative: Per PT, pt does not want to return to [facility]. LCSW met with pt who was unable to provide information on who she would be staying with. Per chart, oriented x2. She is agreeable to LCSW following up with family. LCSW spoke with Cherise Cornelia who states pt must have misunderstood. He is hoping to be able to bring pt home for a couple hours while at [facility], but cannot care for her around the clock. Pt's daughter, Landa Pine states she is going to discuss with Cherise Cornelia and asks that TOC follow up tomorrow.     Expected Discharge Plan: Skilled Nursing Facility Barriers to Discharge: Continued Medical Work up  Expected Discharge Plan and Services     Post Acute Care Choice: Skilled Nursing Facility Living arrangements for the past 2 months: Single Family Home                                       Social Determinants of Health (SDOH) Interventions SDOH Screenings   Food Insecurity: Patient Unable To Answer (06/13/2024)  Housing: Unknown (06/13/2024)  Transportation Needs: Patient Unable To Answer (06/13/2024)  Utilities: Patient Unable To Answer (06/13/2024)  Social Connections: Patient Unable To Answer (06/13/2024)  Tobacco Use: High Risk (06/13/2024)    Readmission Risk Interventions    06/13/2024   12:39 PM  Readmission Risk Prevention Plan  Transportation Screening Complete  PCP or Specialist Appt within 3-5 Days Not Complete  HRI or Home Care Consult Complete  Social Work Consult for Recovery Care Planning/Counseling Complete  Palliative Care Screening Not Applicable  Medication Review Oceanographer) Complete

## 2024-06-14 NOTE — Evaluation (Signed)
 Physical Therapy Evaluation Patient Details Name: Rita [name] MRN: [id number] DOB: 05/03/1966 Today's Date: 06/14/2024  History of Present Illness  History limited due to patients global aphasia   Rita [name] is a 58 y.o. female with hx of recent history of admission 6/2 - 6/11 with L MCA infarct secondary to left distal ICA stent thrombosis (initial placement 9/'23) and M1 occlusion s/p TNK and Neuro IR thrombectomy, c/b R hemiparesis, global aphasia, dysphagia; additional history including, HTN, HLD, substance use disorder (cocaine), smoking, who was brought in by EMS from SNF after found with decreased responsiveness on the toilet, with what appeared to be melena and dark material around mouth, possible coffee ground emesis.   Clinical Impression  Patient able to sit up at bedside without assistance, demonstrates labored movement for transferring to chair and ambulating in hallway without loss of balance without need for an AD. Patient limited mostly due to fatigue and tolerated sitting up in chair after therapy. Patient will benefit from continued skilled physical therapy in hospital and recommended venue below to increase strength, balance, endurance for safe ADLs and gait.       If plan is discharge home, recommend the following: A little help with walking and/or transfers;A little help with bathing/dressing/bathroom;Help with stairs or ramp for entrance;Assistance with cooking/housework   Can travel by private vehicle   Yes    Equipment Recommendations None recommended by PT  Recommendations for Other Services       Functional Status Assessment Patient has had a recent decline in their functional status and demonstrates the ability to make significant improvements in function in a reasonable and predictable amount of time.     Precautions / Restrictions Precautions Precautions: Fall Recall of Precautions/Restrictions: Intact Restrictions Weight Bearing Restrictions Per  Provider Order: No      Mobility  Bed Mobility Overal bed mobility: Needs Assistance Bed Mobility: Supine to Sit     Supine to sit: Supervision Sit to supine: Supervision   General bed mobility comments: slightly labored movement    Transfers Overall transfer level: Needs assistance Equipment used: None Transfers: Sit to/from Stand, Bed to chair/wheelchair/BSC Sit to Stand: Contact guard assist   Step pivot transfers: Contact guard assist       General transfer comment: slightly labored movement without need for an AD    Ambulation/Gait Ambulation/Gait assistance: Supervision, Contact guard assist Gait Distance (Feet): 60 Feet Assistive device: None Gait Pattern/deviations: Decreased step length - right, Decreased step length - left, Decreased stride length Gait velocity: decreased     General Gait Details: slow labored movement without loss of balance or need for an AD  Stairs            Wheelchair Mobility     Tilt Bed    Modified Rankin (Stroke Patients Only)       Balance Overall balance assessment: Needs assistance Sitting-balance support: Feet supported, No upper extremity supported Sitting balance-Leahy Scale: Good Sitting balance - Comments: seated at EOB   Standing balance support: No upper extremity supported, During functional activity Standing balance-Leahy Scale: Fair Standing balance comment: fair/good without AD                             Pertinent Vitals/Pain Pain Assessment Pain Assessment: Faces Pain Score: 0-No pain    Home Living Family/patient expects to be discharged to:: Private residence Living Arrangements: Spouse/significant other Available Help at Discharge: Family;Available 24 hours/day Type of  Home: House Home Access: Stairs to enter Entrance Stairs-Rails: Right Entrance Stairs-Number of Steps: 1   Home Layout: One level Home Equipment: Cane - single point Additional Comments: per chart due  tpt's expressive difficulties; pt came from SNF which she reported was for short term purposes.    Prior Function Prior Level of Function : Independent/Modified Independent             Mobility Comments: Pt reports independent ambulation ADLs Comments: Pt reports independence with ADL's.     Extremity/Trunk Assessment   Upper Extremity Assessment Upper Extremity Assessment: Defer to OT evaluation RUE Deficits / Details: 4-/5 shoulder flexion; 4+/5 elbow flexion; poor ability to motor plan for wrist flexion and extension; 4-/5 gross grasp; poor fine and gross motor skills RUE Sensation: WNL RUE Coordination: decreased fine motor;decreased gross motor LUE Deficits / Details: WFL    Lower Extremity Assessment Lower Extremity Assessment: Generalized weakness;RLE deficits/detail RLE Deficits / Details: grossly -4/5 RLE Sensation: decreased light touch RLE Coordination: WNL    Cervical / Trunk Assessment Cervical / Trunk Assessment: Normal  Communication   Communication Communication: Impaired Factors Affecting Communication: Difficulty expressing self    Cognition Arousal: Alert Behavior During Therapy: WFL for tasks assessed/performed   PT - Cognitive impairments: Difficult to assess, No apparent impairments                         Following commands: Intact Following commands impaired: Follows one step commands with increased time     Cueing Cueing Techniques: Verbal cues, Gestural cues, Tactile cues     General Comments General comments (skin integrity, edema, etc.): HR noted to increase during ambulation above 100 BPM    Exercises     Assessment/Plan    PT Assessment Patient needs continued PT services  PT Problem List Decreased strength;Decreased activity tolerance;Decreased balance;Decreased mobility;Decreased coordination;Impaired sensation       PT Treatment Interventions DME instruction;Gait training;Stair training;Functional mobility  training;Therapeutic activities;Therapeutic exercise;Balance training;Patient/family education    PT Goals (Current goals can be found in the Care Plan section)  Acute Rehab PT Goals Patient Stated Goal: return home with family to assist PT Goal Formulation: With patient Time For Goal Achievement: 06/28/24 Potential to Achieve Goals: Good    Frequency Min 3X/week     Co-evaluation PT/OT/SLP Co-Evaluation/Treatment: Yes Reason for Co-Treatment: To address functional/ADL transfers PT goals addressed during session: Mobility/safety with mobility;Balance OT goals addressed during session: ADL's and self-care       AM-PAC PT 6 Clicks Mobility  Outcome Measure Help needed turning from your back to your side while in a flat bed without using bedrails?: None Help needed moving from lying on your back to sitting on the side of a flat bed without using bedrails?: A Little Help needed moving to and from a bed to a chair (including a wheelchair)?: A Little Help needed standing up from a chair using your arms (e.g., wheelchair or bedside chair)?: A Little Help needed to walk in hospital room?: A Little Help needed climbing 3-5 steps with a railing? : A Lot 6 Click Score: 18    End of Session Equipment Utilized During Treatment: Gait belt Activity Tolerance: Patient tolerated treatment well;Patient limited by fatigue Patient left: in chair;with call bell/phone within reach Nurse Communication: Mobility status PT Visit Diagnosis: Unsteadiness on feet (R26.81);Hemiplegia and hemiparesis Hemiplegia - Right/Left: Right Hemiplegia - dominant/non-dominant: Dominant Hemiplegia - caused by: Cerebral infarction    Time: 2130-8657 PT Time  Calculation (min) (ACUTE ONLY): 23 min   Charges:   PT Evaluation $PT Eval Moderate Complexity: 1 Mod PT Treatments $Therapeutic Activity: 23-37 mins PT General Charges $$ ACUTE PT VISIT: 1 Visit         2:40 PM, 06/14/24 Walton Guppy,  MPT Physical Therapist with [facility] [phone] office [phone] mobile phone

## 2024-06-14 NOTE — Plan of Care (Signed)
  Problem: Education: Goal: Knowledge of General Education information will improve Description: Including pain rating scale, medication(s)/side effects and non-pharmacologic comfort measures Outcome: Progressing   Problem: Health Behavior/Discharge Planning: Goal: Ability to manage health-related needs will improve Outcome: Progressing   Problem: Clinical Measurements: Goal: Ability to maintain clinical measurements within normal limits will improve Outcome: Progressing Goal: Will remain free from infection Outcome: Progressing Goal: Diagnostic test results will improve Outcome: Progressing Goal: Respiratory complications will improve Outcome: Progressing   Problem: Activity: Goal: Risk for activity intolerance will decrease Outcome: Progressing   Problem: Nutrition: Goal: Adequate nutrition will be maintained Outcome: Progressing   Problem: Coping: Goal: Level of anxiety will decrease Outcome: Progressing   Problem: Elimination: Goal: Will not experience complications related to bowel motility Outcome: Progressing Goal: Will not experience complications related to urinary retention Outcome: Progressing   Problem: Pain Managment: Goal: General experience of comfort will improve and/or be controlled Outcome: Progressing   Problem: Safety: Goal: Ability to remain free from injury will improve Outcome: Progressing   Problem: Skin Integrity: Goal: Risk for impaired skin integrity will decrease Outcome: Progressing   Problem: Education: Goal: Ability to describe self-care measures that may prevent or decrease complications (Diabetes Survival Skills Education) will improve Outcome: Progressing Goal: Individualized Educational Video(s) Outcome: Progressing   Problem: Coping: Goal: Ability to adjust to condition or change in health will improve Outcome: Progressing   Problem: Fluid Volume: Goal: Ability to maintain a balanced intake and output will  improve Outcome: Progressing   Problem: Health Behavior/Discharge Planning: Goal: Ability to identify and utilize available resources and services will improve Outcome: Progressing Goal: Ability to manage health-related needs will improve Outcome: Progressing   Problem: Metabolic: Goal: Ability to maintain appropriate glucose levels will improve Outcome: Progressing   Problem: Nutritional: Goal: Maintenance of adequate nutrition will improve Outcome: Progressing Goal: Progress toward achieving an optimal weight will improve Outcome: Progressing   Problem: Skin Integrity: Goal: Risk for impaired skin integrity will decrease Outcome: Progressing   Problem: Tissue Perfusion: Goal: Adequacy of tissue perfusion will improve Outcome: Progressing   Problem: Education: Goal: Ability to describe self-care measures that may prevent or decrease complications (Diabetes Survival Skills Education) will improve Outcome: Progressing Goal: Individualized Educational Video(s) Outcome: Progressing   Problem: Cardiac: Goal: Ability to maintain an adequate cardiac output will improve Outcome: Progressing   Problem: Health Behavior/Discharge Planning: Goal: Ability to identify and utilize available resources and services will improve Outcome: Progressing Goal: Ability to manage health-related needs will improve Outcome: Progressing   Problem: Fluid Volume: Goal: Ability to achieve a balanced intake and output will improve Outcome: Progressing   Problem: Metabolic: Goal: Ability to maintain appropriate glucose levels will improve Outcome: Progressing   Problem: Nutritional: Goal: Maintenance of adequate nutrition will improve Outcome: Progressing Goal: Maintenance of adequate weight for body size and type will improve Outcome: Progressing   Problem: Respiratory: Goal: Will regain and/or maintain adequate ventilation Outcome: Progressing   Problem: Urinary Elimination: Goal:  Ability to achieve and maintain adequate renal perfusion and functioning will improve Outcome: Progressing   Problem: Clinical Measurements: Goal: Cardiovascular complication will be avoided Outcome: Not Progressing

## 2024-06-14 NOTE — Care Management Important Message (Signed)
 Important Message  Patient Details  Name: Rita [name] MRN: [id number] Date of Birth: 1966/06/26   Important Message Given:  Yes - Medicare IM     [name] 06/14/2024, 9:45 AM

## 2024-06-14 NOTE — Plan of Care (Signed)
  Problem: Acute Rehab PT Goals(only PT should resolve) Goal: Pt Will Go Supine/Side To Sit Outcome: Progressing Flowsheets (Taken 06/14/2024 1443) Pt will go Supine/Side to Sit:  Independently  with modified independence Goal: Patient Will Transfer Sit To/From Stand Outcome: Progressing Flowsheets (Taken 06/14/2024 1443) Patient will transfer sit to/from stand:  Independently  with modified independence Goal: Pt Will Transfer Bed To Chair/Chair To Bed Outcome: Progressing Flowsheets (Taken 06/14/2024 1443) Pt will Transfer Bed to Chair/Chair to Bed:  Independently  with modified independence Goal: Pt Will Ambulate Outcome: Progressing Flowsheets (Taken 06/14/2024 1443) Pt will Ambulate:  100 feet  with supervision  with modified independence  with least restrictive assistive device   2:43 PM, 06/14/24 Walton Guppy, MPT Physical Therapist with [facility] [phone] office [phone] mobile phone

## 2024-06-14 NOTE — Evaluation (Signed)
 Occupational Therapy Evaluation Patient Details Name: Rita [name] MRN: [id number] DOB: November 22, 1966 Today's Date: 06/14/2024   History of Present Illness   History limited due to patients global aphasia   Rita [name] is a 58 y.o. female with hx of recent history of admission 6/2 - 6/11 with L MCA infarct secondary to left distal ICA stent thrombosis (initial placement 9/'23) and M1 occlusion s/p TNK and Neuro IR thrombectomy, c/b R hemiparesis, global aphasia, dysphagia; additional history including, HTN, HLD, substance use disorder (cocaine), smoking, who was brought in by EMS from SNF after found with decreased responsiveness on the toilet, with what appeared to be melena and dark material around mouth, possible coffee ground emesis. (per MD)     Clinical Impressions Pt agreeable to OT and PT co-evaluation. Pt able to communicate using yes and no questions with gestures. Pt reported desire to return home and not to SNF. Pt was able to complete bed mobility with supervision and lower body dressing with set up assist at the EOB today. Able to ambulate in the hall without AD and CGA. R UE limited in strength and coordination but able to use functionally with extended time. No family present to confirm level of assist at home. Pt left in the chair with call bell within reach. Pt will benefit from continued OT in the hospital and recommended venue below to increase strength, balance, and endurance for safe ADL's.        If plan is discharge home, recommend the following:   A little help with walking and/or transfers;A little help with bathing/dressing/bathroom;Assistance with cooking/housework;Assist for transportation;Help with stairs or ramp for entrance     Functional Status Assessment   Patient has had a recent decline in their functional status and demonstrates the ability to make significant improvements in function in a reasonable and predictable amount of time.     Equipment  Recommendations   None recommended by OT             Precautions/Restrictions   Precautions Precautions: Fall Recall of Precautions/Restrictions: Intact Restrictions Weight Bearing Restrictions Per Provider Order: No     Mobility Bed Mobility Overal bed mobility: Needs Assistance Bed Mobility: Supine to Sit     Supine to sit: Supervision     General bed mobility comments: no physical assist needed    Transfers Overall transfer level: Needs assistance Equipment used: None Transfers: Sit to/from Stand, Bed to chair/wheelchair/BSC Sit to Stand: Contact guard assist     Step pivot transfers: Contact guard assist     General transfer comment: from EOB to chair      Balance Overall balance assessment: Needs assistance Sitting-balance support: Feet supported, No upper extremity supported Sitting balance-Leahy Scale: Good Sitting balance - Comments: seated at EOB   Standing balance support: No upper extremity supported, During functional activity Standing balance-Leahy Scale: Fair Standing balance comment: fair to good without AD                           ADL either performed or assessed with clinical judgement   ADL Overall ADL's : Needs assistance/impaired     Grooming: Set up;Sitting   Upper Body Bathing: Set up;Sitting   Lower Body Bathing: Set up;Sitting/lateral leans;Minimal assistance   Upper Body Dressing : Set up;Sitting;Minimal assistance   Lower Body Dressing: Set up;Minimal assistance Lower Body Dressing Details (indicate cue type and reason): Pt able to don socks without physical assist today  seated at the EOB; extended time needed Toilet Transfer: Contact guard Marine scientist Details (indicate cue type and reason): simulated via ambulation in the room and hall without AD Toileting- Clothing Manipulation and Hygiene: Minimal assistance;Set up;Sitting/lateral lean       Functional mobility during ADLs:  Contact guard assist       Vision Baseline Vision/History: 1 Wears glasses Ability to See in Adequate Light: 1 Impaired Patient Visual Report: No change from baseline Vision Assessment?: No apparent visual deficits     Perception Perception: Not tested       Praxis Praxis: Impaired Praxis Impairment Details: Initiation, Ideomotor Praxis-Other Comments: Based on pt difficult coordinating movement for R UE assessment.   Pertinent Vitals/Pain Pain Assessment Pain Assessment: Faces Faces Pain Scale: No hurt     Extremity/Trunk Assessment Upper Extremity Assessment Upper Extremity Assessment: RUE deficits/detail;LUE deficits/detail RUE Deficits / Details: 4-/5 shoulder flexion; 4+/5 elbow flexion; poor ability to motor plan for wrist flexion and extension; 4-/5 gross grasp; poor fine and gross motor skills RUE Sensation: WNL RUE Coordination: decreased fine motor;decreased gross motor LUE Deficits / Details: WFL   Lower Extremity Assessment Lower Extremity Assessment: Defer to PT evaluation   Cervical / Trunk Assessment Cervical / Trunk Assessment: Normal   Communication Communication Communication: Impaired Factors Affecting Communication: Difficulty expressing self   Cognition Arousal: Alert Behavior During Therapy: WFL for tasks assessed/performed Cognition: Difficult to assess (Appears to be [facility] with expressive communication deficits.) Difficult to assess due to: Impaired communication           OT - Cognition Comments: Pt able to follow commands well. Attempted yes/no questions but pt seemed to struggle to respond as she intended to for these questions without providing the question with yes in one and and no in another so the pt could gesture to the answer she was intending.                 Following commands: Intact       Cueing  General Comments   Cueing Techniques: Verbal cues;Gestural cues;Tactile cues  HR noted to increase during ambulation  above 100 BPM              Home Living Family/patient expects to be discharged to:: Skilled nursing facility Living Arrangements: Spouse/significant other (fiance) Available Help at Discharge: Family;Available 24 hours/day Type of Home: House Home Access: Stairs to enter [employer] of Steps: 1 Entrance Stairs-Rails: Right Home Layout: One level     Bathroom Shower/Tub: Chief Strategy Officer: Standard Bathroom Accessibility: Yes How Accessible: Accessible via walker Home Equipment: Cane - single point   Additional Comments: per chart due tpt's expressive difficulties; pt came from SNF which she reported was for short term purposes.      Prior Functioning/Environment Prior Level of Function : Independent/Modified Independent             Mobility Comments: Pt reports independent ambulation ADLs Comments: Pt reports independence with ADL's.    OT Problem List: Decreased activity tolerance;Impaired balance (sitting and/or standing);Decreased coordination;Decreased knowledge of use of DME or AE;Decreased knowledge of precautions;Impaired UE functional use;Decreased strength;Decreased range of motion   OT Treatment/Interventions: Self-care/ADL training;Therapeutic exercise;Neuromuscular education;Energy conservation;DME and/or AE instruction;Therapeutic activities;Patient/family education;Balance training      OT Goals(Current goals can be found in the care plan section)   Acute Rehab OT Goals Patient Stated Goal: return home OT Goal Formulation: With patient Time For Goal Achievement: 06/28/24 Potential to Achieve Goals:  Good   OT Frequency:  Min 2X/week    Co-evaluation PT/OT/SLP Co-Evaluation/Treatment: Yes Reason for Co-Treatment: To address functional/ADL transfers   OT goals addressed during session: ADL's and self-care                       End of Session Equipment Utilized During Treatment: Gait belt Nurse  Communication: Mobility status  Activity Tolerance: Patient tolerated treatment well Patient left: in chair;with call bell/phone within reach  OT Visit Diagnosis: Unsteadiness on feet (R26.81);Muscle weakness (generalized) (M62.81)                Time: 1610-9604 OT Time Calculation (min): 22 min Charges:  OT General Charges $OT Visit: 1 Visit OT Evaluation $OT Eval Low Complexity: 1 Low  Vivan Vanderveer OT, MOT   Thurnell Floss 06/14/2024, 11:19 AM

## 2024-06-14 NOTE — Progress Notes (Signed)
 Gastroenterology Progress Note   Referring Provider: No ref. provider found Primary Care Physician:  [facility] Primary Gastroenterologist: Windsor Hatcher.Rourk, MD  Patient ID: Rita [name]; [id number]; 1966/12/08    Subjective   Alert and interactive this morning.  She is only able to answer yes or no to questions and was able to say good morning.  Throughout conversation/Q&A she occasionally does not answer yes/no appropriately.  Through yes or no questioning she denied any abdominal pain or any emesis.  No family at bedside.  Spoke with nursing who stated she has not had any bowel movements or any vomiting.  She has no complaints of any abdominal pain.  Ate breakfast this morning but was not very hungry for lunch time.  Patient did answer yes to feeling tired/fatigued.   Objective   Vital signs in last 24 hours Temp:  [97.7 F (36.5 C)-98.7 F (37.1 C)] 97.9 F (36.6 C) (06/20 1146) Pulse Rate:  [97-111] 108 (06/20 0409) Resp:  [12-22] 14 (06/20 0409) BP: (84-121)/(36-67) 113/61 (06/20 0409) SpO2:  [92 %-100 %] 100 % (06/20 0902) Weight:  [98.5 kg] 98.5 kg (06/20 0500) Last BM Date :  (pta)  Physical Exam General:   Alert and pleasant.  Cooperative. Fatigued.  Head:  Normocephalic and atraumatic. Eyes:  No icterus, sclera clear. Conjuctiva pink.  Abdomen:  Bowel sounds present, soft, non-tender, non-distended. No HSM or hernias noted. No rebound or guarding. No masses appreciated  Msk: Weak grip. Neurologic:  Alert. UTA orientation fully. Aphasic.  Psych:  Alert and cooperative. Normal mood and affect.  Intake/Output from previous day: 06/19 0701 - 06/20 0700 In: 4560.6 [P.O.:240; I.V.:3260.5; IV Piggyback:1060.1] Out: 3050 [Urine:3050] Intake/Output this shift: Total I/O In: 240 [P.O.:240] Out: -   Lab Results  Recent Labs    06/13/24 0236 06/14/24 0411 06/14/24 1137  WBC 35.0* 18.2*  --   HGB 13.1 7.2* 8.1*  HCT 39.3 20.9* 23.8*  PLT 944* 470*   --    BMET Recent Labs    06/13/24 0653 06/13/24 1141 06/14/24 0411  NA 137 137 142  K 3.2* 3.2* 2.8*  CL 110 105 99  CO2 16* 19* 30  GLUCOSE 142* 198* 160*  BUN 90* 94* 65*  CREATININE 1.96* 1.75* 1.22*  CALCIUM  7.8* 8.0* 7.7*   LFT Recent Labs    06/13/24 0236  PROT 6.3*  ALBUMIN 3.1*  AST 31  ALT 39  ALKPHOS 78  BILITOT 0.6   PT/INR No results for input(s): LABPROT, INR in the last 72 hours. Hepatitis Panel No results for input(s): HEPBSAG, HCVAB, HEPAIGM, HEPBIGM in the last 72 hours.  Studies/Results CT Renal Stone Study Result Date: 06/13/2024 CLINICAL DATA:  58 year old female with abdomen and flank pain. History of stroke. EXAM: CT ABDOMEN AND PELVIS WITHOUT CONTRAST TECHNIQUE: Multidetector CT imaging of the abdomen and pelvis was performed following the standard protocol without IV contrast. RADIATION DOSE REDUCTION: This exam was performed according to the departmental dose-optimization program which includes automated exposure control, adjustment of the mA and/or kV according to patient size and/or use of iterative reconstruction technique. COMPARISON:  CT Abdomen and Pelvis 01/20/2019. FINDINGS: Lower chest: Normal heart size. Negative lung bases. No pericardial or pleural effusion. Mildly thickened appearance of the distal thoracic esophagus, appears to be circumferential and is similar to the 2020 comparison (series 2, image 4). Hepatobiliary: Chronic cholecystectomy and hepatic steatosis. Pancreas: Partially atrophied since 2020. Spleen: Negative. Adrenals/Urinary Tract: Normal adrenal glands. Nonobstructed kidneys. No nephrolithiasis.  Symmetric diminutive ureters. Diminutive bladder. Small pelvic phleboliths. Stomach/Bowel: Fluid-filled small and large bowel loops throughout the abdomen and pelvis, fluid in the large bowel to the rectum. No abnormally dilated loops. No transition point. Moderate fluid distended stomach also. No pneumoperitoneum. No  focal mesenteric inflammation identified. Normal appendix identified on coronal image 54. No free fluid. Vascular/Lymphatic: Aortoiliac calcified atherosclerosis. Normal caliber abdominal aorta. No lymphadenopathy. Reproductive: Negative noncontrast appearance. Other: No pelvis free fluid. Musculoskeletal: Chronic lower lumbar disc degeneration with vacuum disc. No acute osseous abnormality identified. IMPRESSION: 1. Fluid-filled bowel throughout the abdomen and pelvis suggesting Enteritis/Diarrhea. Normal appendix. No evidence of bowel obstruction or perforation. 2. This circumferentially thickened appearance of the distal esophagus, similar to a 2020 CT. Consider acute or chronic Esophagitis. 3. No other acute or inflammatory process identified in the noncontrast abdomen or pelvis. Chronic cholecystectomy and hepatic steatosis. Aortic Atherosclerosis (ICD10-I70.0). Electronically Signed   By: Marlise Simpers M.D.   On: 06/13/2024 05:09   DG Chest Portable 1 View Result Date: 06/13/2024 CLINICAL DATA:  Suffered a stroke last week. EXAM: PORTABLE CHEST 1 VIEW COMPARISON:  May 28, 2024 FINDINGS: The heart size and mediastinal contours are within normal limits. There is no evidence of acute infiltrate, pleural effusion or pneumothorax. The visualized skeletal structures are unremarkable. IMPRESSION: No active disease. Electronically Signed   By: Virgle Grime M.D.   On: 06/13/2024 03:22   CT Head Wo Contrast Result Date: 06/13/2024 EXAM: CT HEAD WITHOUT CONTRAST 06/13/2024 03:13:05 AM TECHNIQUE: CT of the head was performed without the administration of intravenous contrast. Automated exposure control, iterative reconstruction, and/or weight based adjustment of the mA/kV was utilized to reduce the radiation dose to as low as reasonably achievable. COMPARISON: MRI brain dated 05/28/2024. CLINICAL HISTORY: Mental status change, unknown cause. Pt is drowsy and only responding to name being called at this time. Altered  mental status. FINDINGS: BRAIN AND VENTRICLES: There is no acute intracranial hemorrhage, mass effect or midline shift. No abnormal extra-axial fluid collection. The gray-white differentiation is maintained without an acute infarct. There is no hydrocephalus. Old left PCA (posterior cerebral artery) distribution and MCA (middle cerebral artery) watershed infarcts. Old right cerebellar infarct. Global cortical atrophy. Subcortical and periventricular small vessel ischemic changes. ORBITS: The visualized portion of the orbits demonstrate no acute abnormality. SINUSES: The visualized paranasal sinuses and mastoid air cells demonstrate no acute abnormality. SOFT TISSUES AND SKULL: No acute abnormality of the visualized skull or soft tissues. VASCULATURE: Intracranial atherosclerosis. Left ICA (internal carotid artery) stent. IMPRESSION: 1. No acute intracranial abnormality. 2. Old left PCA distribution and MCA watershed infarcts. Old right cerebellar infarct. 3. Atrophy with small vessel ischemic changes. Electronically signed by: Zadie Herter MD 06/13/2024 03:18 AM EDT RP Workstation: [id number]   DG Swallowing Func-Speech Pathology Result Date: 05/31/2024 Table formatting from the original result was not included. Modified Barium Swallow Study Patient Details Name: Rita [name] MRN: [id number] Date of Birth: January 12, 1966 Today's Date: 05/31/2024 HPI/PMH: HPI: TYLAR MERENDINO is a 58 yo female presenting to [facility] with R sided weakness and aphasia. CTA showed occlusion of the L ICA, L vertebral artery and L subclavian artery occlusions. TNK administered and transferred to [facility] for thrombectomy. Remained intubated after the procedure, 6/2-6/4. MRI shows large L MCA ischemic infarct and nonhemorrhagic L lentiform nucleus and periventricular white matter infarcts. PMH includes HTN, HLD, prior CVA s/p L carotid subclavian bypass graft, COPD, tobacco use, GERD with Barrett's esophagus, chronic pancreatitis, chronic opioid use,  depression Clinical  Impression: Clinical Impression: Swallow study was limited by reduced visibility at the level of the vocal folds and UES due to shadow from shoulder. Pt had difficulty following commands to adjust positioning and even when anatomy was more visible, she did not stay in that position long. Her oral phase was fairly appropriate with liquid and pureed boluses, with only trace residue along the tongue and base of tongue, and minimal interlabial escape, and slow lingual transit with purees. Incomplete velopharyngeal closure was observed with small amounts of boluses flowing upward between the soft palate and posterior pharyngeal wall, but then falling back down into her pharynx. Pharyngeally there is reduced movement of the hyoid, larynx, and epiglottis. Base of tongue retraction and pharyngeal squeeze are reduced. This impacts airway closure more than it does her efficiency. She has pretty consistent penetration across consistencies, with ejection of penetrates with purees (PAS 2) and a little frank penetration with nectar and honey (PAS 3), although with most of the barium clearing spontaneously. Pt had intermittent coughing with thin liquids that was concerning for aspiration but could not be visualized, but when able to see the vocal folds, there were instances when a trace amount of thin liquids reached below them (PAS 7 or 8). Given current cognitive/communicative difficulties, she is not really appropriate for compensatory strategies at this time. Recommend that she advance to puree diet but with nectar (mildly) thick liquids. Factors that may increase risk of adverse event in presence of aspiration Roderick Civatte & Jessy Morocco 2021): Factors that may increase risk of adverse event in presence of aspiration Roderick Civatte & Jessy Morocco 2021): Reduced cognitive function; Dependence for feeding and/or oral hygiene Recommendations/Plan: Swallowing Evaluation Recommendations Swallowing Evaluation Recommendations  Recommendations: PO diet PO Diet Recommendation: Dysphagia 1 (Pureed); Mildly thick liquids (Level 2, nectar thick) Liquid Administration via: Cup; Straw Medication Administration: Crushed with puree Supervision: Staff to assist with self-feeding; Full supervision/cueing for swallowing strategies Swallowing strategies  : Minimize environmental distractions; Slow rate; Small bites/sips; Check for pocketing or oral holding; Check for anterior loss Postural changes: Position pt fully upright for meals Oral care recommendations: Oral care BID (2x/day) Caregiver Recommendations: Avoid jello, ice cream, thin soups, popsicles; Remove water  pitcher Treatment Plan Treatment Plan Treatment recommendations: Therapy as outlined in treatment plan below Follow-up recommendations: Acute inpatient rehab (3 hours/day) Functional status assessment: Patient has had a recent decline in their functional status and demonstrates the ability to make significant improvements in function in a reasonable and predictable amount of time. Treatment frequency: Min 2x/week Treatment duration: 2 weeks Interventions: Aspiration precaution training; Compensatory techniques; Patient/family education; Trials of upgraded texture/liquids; Diet toleration management by SLP Recommendations Recommendations for follow up therapy are one component of a multi-disciplinary discharge planning process, led by the attending physician.  Recommendations may be updated based on patient status, additional functional criteria and insurance authorization. Assessment: Orofacial Exam: Orofacial Exam Oral Cavity - Dentition: Edentulous Anatomy: No data recorded Boluses Administered: Boluses Administered Boluses Administered: Thin liquids (Level 0); Mildly thick liquids (Level 2, nectar thick); Moderately thick liquids (Level 3, honey thick); Puree  Oral Impairment Domain: Oral Impairment Domain Lip Closure: Interlabial escape, no progression to anterior lip Tongue control  during bolus hold: Posterior escape of greater than half of bolus Bolus preparation/mastication: -- (deferred given minimal mastication observed at bedside) Bolus transport/lingual motion: Slow tongue motion Oral residue: Trace residue lining oral structures Location of oral residue : Tongue Initiation of pharyngeal swallow : Pyriform sinuses  Pharyngeal Impairment Domain: Pharyngeal Impairment Domain Soft palate  elevation: Trace column of contrast or air between SP and PW Laryngeal elevation: Partial superior movement of thyroid  cartilage/partial approximation of arytenoids to epiglottic petiole Anterior hyoid excursion: Partial anterior movement Epiglottic movement: Partial inversion Laryngeal vestibule closure: Incomplete, narrow column air/contrast in laryngeal vestibule Pharyngeal stripping wave : Present - diminished Pharyngeal contraction (A/P view only): N/A Pharyngoesophageal segment opening: Complete distension and complete duration, no obstruction of flow (as can be viewed - limited visibility) Tongue base retraction: Narrow column of contrast or air between tongue base and PPW Pharyngeal residue: Trace residue within or on pharyngeal structures Location of pharyngeal residue: Tongue base  Esophageal Impairment Domain: No data recorded Pill: No data recorded Penetration/Aspiration Scale Score: Penetration/Aspiration Scale Score 2.  Material enters airway, remains ABOVE vocal cords then ejected out: Puree 3.  Material enters airway, remains ABOVE vocal cords and not ejected out: Mildly thick liquids (Level 2, nectar thick); Moderately thick liquids (Level 3, honey thick) 8.  Material enters airway, passes BELOW cords without attempt by patient to eject out (silent aspiration) : Thin liquids (Level 0) Compensatory Strategies: No data recorded  General Information: Caregiver present: No  Diet Prior to this Study: Full liquid diet; Thin liquids (Level 0)   Temperature : Normal   Respiratory Status: WFL    Supplemental O2: None (Room air)   History of Recent Intubation: Yes  Behavior/Cognition: Alert; Cooperative; Requires cueing Self-Feeding Abilities: Needs assist with self-feeding Baseline vocal quality/speech: Not observed No data recorded No data recorded Exam Limitations: Limited visibility Goal Planning: Prognosis for improved oropharyngeal function: Good Barriers to Reach Goals: Cognitive deficits; Language deficits No data recorded Patient/Family Stated Goal: difficulty stating Consulted and agree with results and recommendations: Patient; Nurse; Dietitian Pain: Pain Assessment Pain Assessment: Faces Faces Pain Scale: 0 Facial Expression: 0 Body Movements: 0 Muscle Tension: 0 Compliance with ventilator (intubated pts.): N/A Vocalization (extubated pts.): 0 CPOT Total: 0 End of Session: Start Time:SLP Start Time (ACUTE ONLY): 1154 Stop Time: SLP Stop Time (ACUTE ONLY): 1205 Time Calculation:SLP Time Calculation (min) (ACUTE ONLY): 11 min Charges: SLP Evaluations $ SLP Speech Visit: 1 Visit SLP Evaluations $MBS Swallow: 1 Procedure $Swallowing Treatment: 1 Procedure SLP visit diagnosis: SLP Visit Diagnosis: Dysphagia, oropharyngeal phase (R13.12) Past Medical History: Past Medical History: Diagnosis Date  Arthritis   qwhere (02/22/2018)  Barrett's esophagus with esophagitis 03/26/2013  Chronic abdominal pain   Chronic lower back pain   Chronic pancreatitis (HCC)   COPD (chronic obstructive pulmonary disease) (HCC)   Depression   Dyspnea   GERD (gastroesophageal reflux disease)   Heart murmur   Hiatal hernia   High cholesterol   Hypertension   Peptic ulcer   Pneumonia 2000s X 1  Stroke (HCC)   Tobacco use 03/26/2013 Past Surgical History: Past Surgical History: Procedure Laterality Date  AORTIC ARCH ANGIOGRAPHY N/A 01/19/2018  Procedure: AORTIC ARCH ANGIOGRAPHY;  Surgeon: Richrd Char, MD;  Location: [facility];  Service: Cardiovascular;  Laterality: N/A;  BIOPSY  04/17/2017  Procedure: BIOPSY;   Surgeon: Suzette Espy, MD;  [facility]: [facility];  Service: Endoscopy;;  esophageal  CARDIAC CATHETERIZATION    CAROTID-SUBCLAVIAN BYPASS GRAFT Left 03/19/2018  Procedure: BYPASS GRAFT CAROTID-SUBCLAVIAN;  Surgeon: Richrd Char, MD;  Location: [facility] OR;  Service: Vascular;  Laterality: Left;  CESAREAN SECTION  1985; 1988  COLONOSCOPY WITH PROPOFOL  N/A 04/17/2017  Procedure: COLONOSCOPY WITH PROPOFOL ;  Surgeon: Suzette Espy, MD;  [facility]: [facility];  Service: Endoscopy;  Laterality: N/A;  100  ESOPHAGOGASTRODUODENOSCOPY  Oct 2011  Dr. Homero Luster: ulcer in distal esophagus, soft stricture at GE junction s/p balloon dilation PATH: BARRETT'S  ESOPHAGOGASTRODUODENOSCOPY (EGD) WITH ESOPHAGEAL DILATION N/A 03/27/2013  Procedure: ESOPHAGOGASTRODUODENOSCOPY (EGD) WITH ESOPHAGEAL DILATION;  Surgeon: Suzette Espy, MD;  [facility];  Service: Endoscopy;  Laterality: N/A;  possible dilation  ESOPHAGOGASTRODUODENOSCOPY (EGD) WITH PROPOFOL  N/A 01/02/2017  Procedure: ESOPHAGOGASTRODUODENOSCOPY (EGD) WITH PROPOFOL ;  Surgeon: Suzette Espy, MD;  [facility];  Service: Endoscopy;  Laterality: N/A;  with possible esophageal dilation  ESOPHAGOGASTRODUODENOSCOPY (EGD) WITH PROPOFOL  N/A 04/17/2017  Procedure: ESOPHAGOGASTRODUODENOSCOPY (EGD) WITH PROPOFOL ;  Surgeon: Suzette Espy, MD;  [facility];  Service: Endoscopy;  Laterality: N/A;  EUS N/A 05/09/2013  Procedure: UPPER ENDOSCOPIC ULTRASOUND (EUS) LINEAR;  Surgeon: Janel Medford, MD;  Location: [facility];  Service: Endoscopy;  Laterality: N/A;  FRACTURE SURGERY    pt. denies  IR ANGIO INTRA EXTRACRAN SEL COM CAROTID INNOMINATE BILAT MOD SED  09/06/2022  IR ANGIO INTRA EXTRACRAN SEL COM CAROTID INNOMINATE BILAT MOD SED  09/13/2022  IR ANGIO VERTEBRAL SEL SUBCLAVIAN INNOMINATE BILAT MOD SED  09/13/2022  IR ANGIO VERTEBRAL SEL SUBCLAVIAN INNOMINATE UNI R MOD SED  09/06/2022  IR ANGIOGRAM FOLLOW UP STUDY  09/13/2022  IR CT HEAD LTD  09/08/2022  IR  CT HEAD LTD  05/27/2024  IR CT HEAD LTD  05/27/2024  IR NEURO EACH ADD'L AFTER BASIC UNI LEFT (MS)  09/13/2022  IR PATIENT EVAL TECH 0-60 MINS  05/28/2024  IR PERCUTANEOUS ART THROMBECTOMY/INFUSION INTRACRANIAL INC DIAG ANGIO  05/27/2024  IR TRANSCATH/EMBOLIZ  09/08/2022  IR US  GUIDE VASC ACCESS RIGHT  09/08/2022  IR US  GUIDE VASC ACCESS RIGHT  09/06/2022  KNEE ARTHROSCOPY Right   LAPAROSCOPIC CHOLECYSTECTOMY    LEFT HEART CATH AND CORONARY ANGIOGRAPHY N/A 02/26/2018  Procedure: LEFT HEART CATH AND CORONARY ANGIOGRAPHY;  Surgeon: Odie Benne, MD;  [facility]: [facility];  Service: Cardiovascular;  Laterality: N/A;  MALONEY DILATION N/A 04/17/2017  Procedure: Londa Rival DILATION;  Surgeon: Suzette Espy, MD;  [facility]: [facility];  Service: Endoscopy;  Laterality: N/A;  PATELLA FRACTURE SURGERY Left   POLYPECTOMY  04/17/2017  Procedure: POLYPECTOMY;  Surgeon: Suzette Espy, MD;  [facility];  Service: Endoscopy;;  RADIOLOGY WITH ANESTHESIA N/A 09/08/2022  Procedure: L ICA Aneurysm Embolazation;  Surgeon: Luellen Sages, MD;  Location: [facility];  Service: Radiology;  Laterality: N/A;  RADIOLOGY WITH ANESTHESIA N/A 05/27/2024  Procedure: RADIOLOGY WITH ANESTHESIA;  Surgeon: Radiologist, Medication, MD;  Location: [facility] OR;  Service: Radiology;  Laterality: N/A;  TEE WITHOUT CARDIOVERSION N/A 01/23/2019  Procedure: TRANSESOPHAGEAL ECHOCARDIOGRAM (TEE);  Surgeon: Hugh Madura, MD;  Location: [facility] ENDOSCOPY;  Service: Cardiovascular;  Laterality: N/A;  TUBAL LIGATION  1992  UPPER EXTREMITY ANGIOGRAPHY N/A 01/19/2018  Procedure: UPPER EXTREMITY ANGIOGRAPHY;  Surgeon: Richrd Char, MD;  [facility]: [facility];  Service: Cardiovascular;  Laterality: N/A; Beth Brooke., M.A. CCC-SLP Acute Rehabilitation Services Office: [phone] Secure chat preferred 05/31/2024, 1:20 PM  DG Abd Portable 1V Result Date: 05/29/2024 CLINICAL DATA:  Feeding tube placement. EXAM: PORTABLE ABDOMEN - 1 VIEW COMPARISON:  May 27, 2024. FINDINGS: Distal tip of feeding tube is seen in expected position of distal stomach or proximal duodenum. IMPRESSION: Feeding tube tip seen in expected position of distal stomach or proximal duodenum. Electronically Signed   By: Rosalene Colon M.D.   On: 05/29/2024 13:53   IR PERCUTANEOUS ART  THROMBECTOMY/INFUSION INTRACRANIAL INC DIAG ANGIO Result Date: 05/29/2024 INDICATION: Acute onset of left gaze deviation, right-sided hemiplegia, right-sided neglect, and aphasia. 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 EXAM: 1. EMERGENT LARGE VESSEL OCCLUSION THROMBOLYSIS (anterior CIRCULATION) COMPARISON:  CT angiogram of the head and neck of 05/27/2024. MEDICATIONS: No antibiotic was administered within 1 hour of the procedure. ANESTHESIA/SEDATION: General anesthesia. CONTRAST:  Omnipaque  300 approximately 100 mL. FLUOROSCOPY TIME:  Fluoroscopy Time: 23 minutes 4 seconds (1502 mGy). COMPLICATIONS: None immediate. TECHNIQUE: An emergent 2 physician consent was obtained in view of the non availability of a patient's next of kin or relatives physically or from the telephone numbers listed in the patient's chart. The patient was then put under general anesthesia by the Department of Anesthesiology at [facility]. The right groin was prepped and draped in the usual sterile fashion. Thereafter using modified Seldinger technique, transfemoral access into the right common femoral artery was obtained without difficulty. Over an 0.035 inch guidewire an 8 French 25 cm Pinnacle sheath was inserted. Through this, and also over an 0.035 inch guidewire combination of a 125 cm 6 [place] Berenstein support catheter inside of an 088 100 cm Zoom support catheter was advanced to initially the common carotid artery and then to the distal left internal carotid artery at the cervical petrous junction. FINDINGS: The left common carotid arteriogram demonstrates  patency of the common carotid artery subclavian bypass supplying the left subclavian artery. The left common carotid artery bifurcation demonstrates the left external carotid artery and its major branches to be widely patent. 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 No distal opacification of the left middle cerebral artery or the left anterior cerebral artery is evident. PROCEDURE: Through the Zoom support catheter and the distal petrous segment, a combination of an 068 134 cm Penumbra aspiration catheter with a 160 cm 021 Trevo ProVue microcatheter was advanced over an 018 inch Aristotle micro guidewire with a moderate J configuration to the distal end of the 088 support catheter. The micro guidewire was then advanced through the occluded flow diverter device without difficulty into the M2 segment of the inferior division followed by the microcatheter followed by the Zoom 068 aspiration catheter through the pipeline flow diverter device into the left mid M1 segment. The microcatheter and micro guidewire were removed as aspiration was then applied at the hub of the 068 aspiration catheter and at the hub of the 088 support catheter which had been advanced just proximal to the pipeline flow diverter device. The 068 catheter was removed. A control arteriogram performed through the 088 support catheter demonstrated revascularization of the left anterior and left middle cerebral artery distributions. A TICI 2C revascularization was obtained. A control arteriogram through the support catheter revealed a moderate-sized filling defect in the supraclinoid left ICA, and also along the lower edge of the pipeline flow diverter device. A second pass was then made using a 120 cm 070 APRO aspiration catheter which  was advanced in combination with an 021 microcatheter over an 018 inch Aristotle micro guidewire. Access was obtained with a microcatheter and micro guidewire in the M2 region of an inferior division branch. The 070 APRO catheter was advanced into the proximal left M1 segment. Aspiration was then applied at the  hub of the APRO aspiration catheter, and also the Zoom support catheter following removal of the microcatheter. After a minute and a half of aspiration, the combination was retrieved and removed. A control arteriogram performed through the Zoom support catheter demonstrated moderate improvement in the previously noted filling defects in the supraclinoid left ICA, and also along the lower portion of the flow diverter device. Spasm of the left M1 segment was treated with 3 aliquots of 25 mcg of Nitroglycerin  with complete relief. Over the next few minutes, decrease in the filling defect was noted within the pipeline flow diverter device. Also noted was decreased hemodynamic flow into the left MCA distribution. A follow-up biplane DSA now demonstrated progressively worsening flow through the left M1 distribution secondary to increasing filling defect within the supraclinoid left ICA, and in the proximal left MCA leading to complete occlusion of the left supraclinoid ICA and the proximal left M1 segment. A third pass was made again using a combination of the APRO 125 070 aspiration catheter advanced again into the proximal M1 segment in conjunction with an 021 microcatheter and the micro guidewire. The micro guidewire and the microcatheter were removed. Aspiration was then applied for a minute and a half through the hub of the APRO intermediate catheter. This was then removed. A DSA through the support catheter now demonstrated improved flow through the supraclinoid left ICA and MCA distribution. Due to the aggressive platelet aggregation, it was decided to proceed with use of IV cangrelor  infusion in order to  prevent further platelet aggregation. Left femoral venous access was then obtained due to the patient having only 1 IV at the start of the procedure. Attempts to obtain access via a vein were unsuccessful in both arms. DSAs performed at 10 and 20 minutes post commencement of the IV cangrelor  demonstrated gradual clearing of filling defects in the supraclinoid left ICA, the pipeline flow diverter device, and the in the left MCA distribution. A final control arteriogram performed through the Zoom support catheter in the left internal artery now demonstrated complete revascularization of the left MCA distribution and of the supraclinoid left ICA achieving a TICI 3 revascularization. The left anterior cerebral artery remained widely patent. A flap panel CT obtained at the end of the procedure demonstrated no evidence of intracranial hemorrhage or mass effect. An 8 French Angio-Seal closure device was deployed for hemostasis at the right groin puncture site. The left 4 French sheath was left in position to continue the 4 hour IV cangrelor  infusion. Distal pulses remained present in both feet at the end of the procedure. The patient was left intubated due to her medical condition. She was then transferred to neuro ICU for post revascularization care. Medications: Aspirin  81 mg, and Brilinta  90 mg p.o. via a nasogastric tube after the first pass. IV half bolus dose of cangrelor , followed by a half dose infusion over 4 hours. CT scan of the brain to be obtained at the end of 4 hours. IMPRESSION: Status post revascularization of the occluded distal left internal carotid artery supraclinoid segment, and the proximal left middle cerebral M1 segment with 3 passes of contact aspiration achieving a TICI 3 revascularization. PLAN: As per Stroke Neurology. Electronically Signed   By: Luellen Sages M.D.   On: 05/29/2024 08:05   IR CT Head Ltd Result Date: 05/29/2024 INDICATION: Acute onset of left gaze deviation, right-sided  hemiplegia, right-sided neglect, and aphasia. 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 EXAM: 1. EMERGENT LARGE VESSEL OCCLUSION THROMBOLYSIS (anterior CIRCULATION) COMPARISON:  CT angiogram of the head and neck of 05/27/2024. MEDICATIONS: No antibiotic was administered within 1 hour of the procedure. ANESTHESIA/SEDATION: General anesthesia. CONTRAST:  Omnipaque  300 approximately 100 mL. FLUOROSCOPY TIME:  Fluoroscopy Time: 23 minutes 4 seconds (1502 mGy). COMPLICATIONS: None immediate. TECHNIQUE: An emergent 2 physician consent was obtained in view of the non availability of a patient's next of kin or relatives physically or from the telephone numbers listed in the patient's chart. The patient was then put under general anesthesia by the Department of Anesthesiology at [facility]. The right groin was prepped and draped in the usual sterile fashion. Thereafter using modified Seldinger technique, transfemoral access into the right common femoral artery was obtained without difficulty. Over an 0.035 inch guidewire an 8 French 25 cm Pinnacle sheath was inserted. Through this, and also over an 0.035 inch guidewire combination of a 125 cm 6 [place] Berenstein support catheter inside of an 088 100 cm Zoom support catheter was advanced to initially the common carotid artery and then to the distal left internal carotid artery at the cervical petrous junction. FINDINGS: The left common carotid arteriogram demonstrates patency of the common carotid artery subclavian bypass supplying the left subclavian artery. The left common carotid artery bifurcation demonstrates the left external carotid artery and its major branches to be widely patent. 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 No distal opacification of the left middle cerebral artery or the left anterior cerebral artery is evident. PROCEDURE: Through the Zoom support catheter and the distal petrous segment, a combination of an 068 134 cm Penumbra aspiration catheter with a 160 cm 021 Trevo ProVue microcatheter was advanced over an 018 inch Aristotle micro guidewire with a moderate J configuration to the distal end of the 088 support catheter. The micro guidewire was then advanced through the occluded flow diverter device without difficulty into the M2 segment of the inferior division followed by the microcatheter followed by the Zoom 068 aspiration catheter through the pipeline flow diverter device into the left mid M1 segment. The microcatheter and micro guidewire were removed as aspiration was then applied at the hub of the 068 aspiration catheter and at the hub of the 088 support catheter which had been advanced just proximal to the pipeline flow diverter device. The 068 catheter was removed. A control arteriogram performed through the 088 support catheter demonstrated revascularization of the left anterior and left middle cerebral artery distributions. A TICI 2C revascularization was obtained. A control arteriogram through the support catheter revealed a moderate-sized filling defect in the supraclinoid left ICA, and also along the lower edge of the pipeline flow diverter device. A second pass was then made using a 120 cm 070 APRO aspiration catheter which was advanced in combination with an 021 microcatheter over an 018 inch Aristotle micro guidewire. Access was obtained with a microcatheter and micro guidewire in the M2 region of an inferior division branch. The 070 APRO catheter was advanced into the proximal left M1 segment. Aspiration was then applied at the hub of the APRO aspiration catheter, and also the Zoom support catheter following removal of the microcatheter. After a minute  and a half of aspiration, the combination was retrieved and removed. A control arteriogram performed through the  Zoom support catheter demonstrated moderate improvement in the previously noted filling defects in the supraclinoid left ICA, and also along the lower portion of the flow diverter device. Spasm of the left M1 segment was treated with 3 aliquots of 25 mcg of Nitroglycerin  with complete relief. Over the next few minutes, decrease in the filling defect was noted within the pipeline flow diverter device. Also noted was decreased hemodynamic flow into the left MCA distribution. A follow-up biplane DSA now demonstrated progressively worsening flow through the left M1 distribution secondary to increasing filling defect within the supraclinoid left ICA, and in the proximal left MCA leading to complete occlusion of the left supraclinoid ICA and the proximal left M1 segment. A third pass was made again using a combination of the APRO 125 070 aspiration catheter advanced again into the proximal M1 segment in conjunction with an 021 microcatheter and the micro guidewire. The micro guidewire and the microcatheter were removed. Aspiration was then applied for a minute and a half through the hub of the APRO intermediate catheter. This was then removed. A DSA through the support catheter now demonstrated improved flow through the supraclinoid left ICA and MCA distribution. Due to the aggressive platelet aggregation, it was decided to proceed with use of IV cangrelor  infusion in order to prevent further platelet aggregation. Left femoral venous access was then obtained due to the patient having only 1 IV at the start of the procedure. Attempts to obtain access via a vein were unsuccessful in both arms. DSAs performed at 10 and 20 minutes post commencement of the IV cangrelor  demonstrated gradual clearing of filling defects in the supraclinoid left ICA, the pipeline flow diverter device, and the in the left MCA  distribution. A final control arteriogram performed through the Zoom support catheter in the left internal artery now demonstrated complete revascularization of the left MCA distribution and of the supraclinoid left ICA achieving a TICI 3 revascularization. The left anterior cerebral artery remained widely patent. A flap panel CT obtained at the end of the procedure demonstrated no evidence of intracranial hemorrhage or mass effect. An 8 French Angio-Seal closure device was deployed for hemostasis at the right groin puncture site. The left 4 French sheath was left in position to continue the 4 hour IV cangrelor  infusion. Distal pulses remained present in both feet at the end of the procedure. The patient was left intubated due to her medical condition. She was then transferred to neuro ICU for post revascularization care. Medications: Aspirin  81 mg, and Brilinta  90 mg p.o. via a nasogastric tube after the first pass. IV half bolus dose of cangrelor , followed by a half dose infusion over 4 hours. CT scan of the brain to be obtained at the end of 4 hours. IMPRESSION: Status post revascularization of the occluded distal left internal carotid artery supraclinoid segment, and the proximal left middle cerebral M1 segment with 3 passes of contact aspiration achieving a TICI 3 revascularization. PLAN: As per Stroke Neurology. Electronically Signed   By: Luellen Sages M.D.   On: 05/29/2024 08:05   IR CT Head Ltd Result Date: 05/29/2024 INDICATION: Acute onset of left gaze deviation, right-sided hemiplegia, right-sided neglect, and aphasia. 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 EXAM: 1. EMERGENT LARGE VESSEL OCCLUSION THROMBOLYSIS (anterior CIRCULATION) COMPARISON:  CT angiogram of the head and neck of 05/27/2024. MEDICATIONS: No antibiotic was administered within 1 hour of the procedure. ANESTHESIA/SEDATION: General anesthesia. CONTRAST:  Omnipaque   300 approximately 100 mL. FLUOROSCOPY TIME:  Fluoroscopy Time: 23 minutes 4 seconds (1502 mGy). COMPLICATIONS: None immediate. TECHNIQUE: An emergent 2 physician consent was obtained in view of the non availability of a patient's next of kin or relatives physically or from the telephone numbers listed in the patient's chart. The patient was then put under general anesthesia by the Department of Anesthesiology at [facility]. The right groin was prepped and draped in the usual sterile fashion. Thereafter using modified Seldinger technique, transfemoral access into the right common femoral artery was obtained without difficulty. Over an 0.035 inch guidewire an 8 French 25 cm Pinnacle sheath was inserted. Through this, and also over an 0.035 inch guidewire combination of a 125 cm 6 [place] Berenstein support catheter inside of an 088 100 cm Zoom support catheter was advanced to initially the common carotid artery and then to the distal left internal carotid artery at the cervical petrous junction. FINDINGS: The left common carotid arteriogram demonstrates patency of the common carotid artery subclavian bypass supplying the left subclavian artery. The left common carotid artery bifurcation demonstrates the left external carotid artery and its major branches to be widely patent. 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 No distal opacification of the left middle cerebral artery or the left anterior cerebral artery is evident. PROCEDURE: Through the Zoom support catheter and the distal petrous segment, a combination of an 068 134 cm Penumbra aspiration catheter with a 160 cm 021 Trevo ProVue microcatheter was advanced over an 018 inch Aristotle micro guidewire with a moderate J configuration to the  distal end of the 088 support catheter. The micro guidewire was then advanced through the occluded flow diverter device without difficulty into the M2 segment of the inferior division followed by the microcatheter followed by the Zoom 068 aspiration catheter through the pipeline flow diverter device into the left mid M1 segment. The microcatheter and micro guidewire were removed as aspiration was then applied at the hub of the 068 aspiration catheter and at the hub of the 088 support catheter which had been advanced just proximal to the pipeline flow diverter device. The 068 catheter was removed. A control arteriogram performed through the 088 support catheter demonstrated revascularization of the left anterior and left middle cerebral artery distributions. A TICI 2C revascularization was obtained. A control arteriogram through the support catheter revealed a moderate-sized filling defect in the supraclinoid left ICA, and also along the lower edge of the pipeline flow diverter device. A second pass was then made using a 120 cm 070 APRO aspiration catheter which was advanced in combination with an 021 microcatheter over an 018 inch Aristotle micro guidewire. Access was obtained with a microcatheter and micro guidewire in the M2 region of an inferior division branch. The 070 APRO catheter was advanced into the proximal left M1 segment. Aspiration was then applied at the hub of the APRO aspiration catheter, and also the Zoom support catheter following removal of the microcatheter. After a minute and a half of aspiration, the combination was retrieved and removed. A control arteriogram performed through the Zoom support catheter demonstrated moderate improvement in the previously noted filling defects in the supraclinoid left ICA, and also along the lower portion of the flow diverter device. Spasm of the left M1 segment was treated with 3 aliquots  of 25 mcg of Nitroglycerin  with complete relief. Over the next few  minutes, decrease in the filling defect was noted within the pipeline flow diverter device. Also noted was decreased hemodynamic flow into the left MCA distribution. A follow-up biplane DSA now demonstrated progressively worsening flow through the left M1 distribution secondary to increasing filling defect within the supraclinoid left ICA, and in the proximal left MCA leading to complete occlusion of the left supraclinoid ICA and the proximal left M1 segment. A third pass was made again using a combination of the APRO 125 070 aspiration catheter advanced again into the proximal M1 segment in conjunction with an 021 microcatheter and the micro guidewire. The micro guidewire and the microcatheter were removed. Aspiration was then applied for a minute and a half through the hub of the APRO intermediate catheter. This was then removed. A DSA through the support catheter now demonstrated improved flow through the supraclinoid left ICA and MCA distribution. Due to the aggressive platelet aggregation, it was decided to proceed with use of IV cangrelor  infusion in order to prevent further platelet aggregation. Left femoral venous access was then obtained due to the patient having only 1 IV at the start of the procedure. Attempts to obtain access via a vein were unsuccessful in both arms. DSAs performed at 10 and 20 minutes post commencement of the IV cangrelor  demonstrated gradual clearing of filling defects in the supraclinoid left ICA, the pipeline flow diverter device, and the in the left MCA distribution. A final control arteriogram performed through the Zoom support catheter in the left internal artery now demonstrated complete revascularization of the left MCA distribution and of the supraclinoid left ICA achieving a TICI 3 revascularization. The left anterior cerebral artery remained widely patent. A flap panel CT obtained at the end of the procedure demonstrated no evidence of intracranial hemorrhage or mass effect.  An 8 French Angio-Seal closure device was deployed for hemostasis at the right groin puncture site. The left 4 French sheath was left in position to continue the 4 hour IV cangrelor  infusion. Distal pulses remained present in both feet at the end of the procedure. The patient was left intubated due to her medical condition. She was then transferred to neuro ICU for post revascularization care. Medications: Aspirin  81 mg, and Brilinta  90 mg p.o. via a nasogastric tube after the first pass. IV half bolus dose of cangrelor , followed by a half dose infusion over 4 hours. CT scan of the brain to be obtained at the end of 4 hours. IMPRESSION: Status post revascularization of the occluded distal left internal carotid artery supraclinoid segment, and the proximal left middle cerebral M1 segment with 3 passes of contact aspiration achieving a TICI 3 revascularization. PLAN: As per Stroke Neurology. Electronically Signed   By: Luellen Sages M.D.   On: 05/29/2024 08:05   MR BRAIN WO CONTRAST Result Date: 05/29/2024 EXAM: MRI BRAIN WITHOUT CONTRAST 05/28/2024 04:35:37 PM TECHNIQUE: Multiplanar multisequence MRI of the head/brain was performed without the administration of intravenous contrast. COMPARISON: CT head without contrast 05/28/2024. MR head without contrast 05/19/2023. CLINICAL HISTORY: Stroke, follow up; 24 hrs post-TNK r/o hemorrhagic conversion. Revascularization of occluded left internal carotid artery 05/27/2024. FINDINGS: BRAIN AND VENTRICLES: A large acute nonhemorrhagic left MCA territory ischemic infarct is present. Acute nonhemorrhagic infarct is present in the posterior left lentiform nucleus. Acute nonhemorrhagic left periventricular acute white matter infarcts are present. No acute hemorrhage is present. Remote cortical infarcts are present in the left parietal lobe and  along the watershed distribution over the left hemisphere. Remote inferior cerebellar infarcts are present, right greater than  left. No mass. No midline shift. No hydrocephalus. The sella is unremarkable. Normal flow voids. ORBITS: No acute abnormality. SINUSES AND MASTOIDS: Fluid in the nasopharynx is likely secondary to intubation. Moderate mucosal disease is present in the sphenoid sinus bilaterally and posterior maxillary sinuses. Mild mucosal thickening is present in the anterior ethmoid air cells and inferior frontal sinuses. BONES AND SOFT TISSUES: Normal marrow signal. No acute soft tissue abnormality. IMPRESSION: 1. No acute hemorrhage. 2. Large acute nonhemorrhagic left MCA territory ischemic infarct. 3. Acute nonhemorrhagic infarct in the posterior left lentiform nucleus. 4. Acute nonhemorrhagic left periventricular white matter infarcts. 5. Remote cortical infarcts in the left parietal lobe and along the watershed distribution over the left hemisphere. Remote inferior cerebellar infarcts, right greater than left. 6. Moderate mucosal disease in the sphenoid sinus bilaterally and posterior maxillary sinuses. Mild mucosal thickening in the anterior ethmoid air cells and inferior frontal sinuses. The pertinent results were texted to Dr. Murvin Arthurs via the [facility] system at 5:22am. Electronically signed by: Audree Leas MD 05/29/2024 05:23 AM EDT RP Workstation: [id number]   ECHOCARDIOGRAM COMPLETE BUBBLE STUDY Result Date: 05/28/2024    ECHOCARDIOGRAM REPORT   Patient Name:   OSIRIS CHARLES Date of Exam: 05/28/2024 Medical Rec #:  [id number]     Height:       67.0 in Accession #:    [id number]    Weight:       224.9 lb Date of Birth:  January 08, 1966     BSA:          2.126 m Patient Age:    57 years      BP:           120/61 mmHg Patient Gender: F             HR:           86 bpm. Exam Location:  Inpatient Procedure: 2D Echo, Color Doppler and Cardiac Doppler (Both Spectral and Color            Flow Doppler were utilized during procedure). Indications:    Stroke  History:        Patient has prior history of Echocardiogram  examinations, most                 recent 03/31/2023. Risk Factors:Hypertension.  Sonographer:    Jeralene Mom Referring Phys: [id number] Alphonza Ashing STACK IMPRESSIONS  1. Left ventricular ejection fraction, by estimation, is 65 to 70%. The left ventricle has hyperdynamic function. The left ventricle has no regional wall motion abnormalities. Left ventricular diastolic parameters are consistent with Grade I diastolic dysfunction (impaired relaxation).  2. Right ventricular systolic function is normal. The right ventricular size is normal. There is mildly elevated pulmonary artery systolic pressure. The estimated right ventricular systolic pressure is 37.1 mmHg.  3. The mitral valve is normal in structure. No evidence of mitral valve regurgitation. No evidence of mitral stenosis.  4. Increased aortic valve velocities are at least in part secondary to hyperdynamic state/increased cardiac output. The aortic valve is tricuspid. There is mild thickening of the aortic valve. Aortic valve regurgitation is not visualized. Aortic valve sclerosis is present, with no evidence of aortic valve stenosis.  5. Agitated saline contrast bubble study was negative, with no evidence of any interatrial shunt. FINDINGS  Left Ventricle: Left ventricular ejection fraction, by estimation, is 65 to 70%. The left ventricle  has hyperdynamic function. The left ventricle has no regional wall motion abnormalities. The left ventricular internal cavity size was small. There is no  left ventricular hypertrophy. Left ventricular diastolic parameters are consistent with Grade I diastolic dysfunction (impaired relaxation). Indeterminate filling pressures. Right Ventricle: The right ventricular size is normal. Right vetricular wall thickness was not well visualized. Right ventricular systolic function is normal. There is mildly elevated pulmonary artery systolic pressure. The tricuspid regurgitant velocity  is 2.92 m/s, and with an assumed right atrial  pressure of 3 mmHg, the estimated right ventricular systolic pressure is 37.1 mmHg. Left Atrium: Left atrial size was normal in size. Right Atrium: Right atrial size was normal in size. Pericardium: There is no evidence of pericardial effusion. Mitral Valve: The mitral valve is normal in structure. No evidence of mitral valve regurgitation. No evidence of mitral valve stenosis. Tricuspid Valve: The tricuspid valve is normal in structure. Tricuspid valve regurgitation is trivial. Aortic Valve: Increased aortic valve velocities are at least in part secondary to hyperdynamic state/increased cardiac output. The aortic valve is tricuspid. There is mild thickening of the aortic valve. Aortic valve regurgitation is not visualized. Aortic valve sclerosis is present, with no evidence of aortic valve stenosis. Aortic valve mean gradient measures 15.7 mmHg. Aortic valve peak gradient measures 30.5 mmHg. Aortic valve area, by VTI measures 2.42 cm. Pulmonic Valve: The pulmonic valve was grossly normal. Pulmonic valve regurgitation is not visualized. No evidence of pulmonic stenosis. Aorta: The aortic root is normal in size and structure. IAS/Shunts: No atrial level shunt detected by color flow Doppler. Agitated saline contrast bubble study was negative, with no evidence of any interatrial shunt.  LEFT VENTRICLE PLAX 2D LVIDd:         4.80 cm   Diastology LVIDs:         3.00 cm   LV e' medial:    7.29 cm/s LV PW:         0.90 cm   LV E/e' medial:  12.5 LV IVS:        1.10 cm   LV e' lateral:   9.90 cm/s LVOT diam:     2.20 cm   LV E/e' lateral: 9.2 LV SV:         113 LV SV Index:   53 LVOT Area:     3.80 cm  LEFT ATRIUM           Index LA diam:      3.80 cm 1.79 cm/m LA Vol (A4C): 45.4 ml 21.36 ml/m  AORTIC VALVE AV Area (Vmax):    2.36 cm AV Area (Vmean):   2.39 cm AV Area (VTI):     2.42 cm AV Vmax:           276.00 cm/s AV Vmean:          180.000 cm/s AV VTI:            0.469 m AV Peak Grad:      30.5 mmHg AV Mean Grad:       15.7 mmHg LVOT Vmax:         171.00 cm/s LVOT Vmean:        113.000 cm/s LVOT VTI:          0.298 m LVOT/AV VTI ratio: 0.64  AORTA Ao Root diam: 2.90 cm MITRAL VALVE                TRICUSPID VALVE MV Area (PHT): 3.31 cm  TR Peak grad:   34.1 mmHg MV Decel Time: 229 msec     TR Vmax:        292.00 cm/s MV E velocity: 91.30 cm/s MV A velocity: 122.00 cm/s  SHUNTS MV E/A ratio:  0.75         Systemic VTI:  0.30 m                             Systemic Diam: 2.20 cm Karyl Paget Croitoru MD Electronically signed by Luana Rumple MD Signature Date/Time: 05/28/2024/1:10:16 PM    Final    IR PATIENT EVAL TECH 0-60 MINS Result Date: 05/28/2024 Cleotis Daily     05/28/2024 10:23 AM Left 4 french venous femoral sheath removed at 1000. Manual pressure applied to obtain hemostasis at 1010. Dressing applied and site reviewed with [facility]. No acute complications. Distal pulses intact.   Portable Chest xray Result Date: 05/28/2024 CLINICAL DATA:  Status post ET tube placement. EXAM: PORTABLE CHEST 1 VIEW COMPARISON:  05/27/2024 FINDINGS: The endotracheal tube tip is 5.5 cm above the carina. Enteric tube courses below the GE junction. Stable cardiomediastinal contours. Mild subsegmental atelectasis in the left midlung and medial right lung base. No significant pleural effusion, interstitial edema or airspace disease. No pneumothorax identified. Visualized osseous structures appear intact. IMPRESSION: 1. Satisfactory position of endotracheal tube. 2. Mild subsegmental atelectasis in the left midlung and medial right lung base. Electronically Signed   By: Kimberley Penman M.D.   On: 05/28/2024 06:08   CT HEAD WO CONTRAST ( ) Result Date: 05/28/2024 EXAM: CT HEAD WITHOUT 05/28/2024 05:25:22 AM TECHNIQUE: CT of the head was performed without the administration of intravenous contrast. Automated exposure control, iterative reconstruction, and/or weight based adjustment of the mA/kV was utilized to reduce the radiation dose to as  low as reasonably achievable. COMPARISON: CT head without contrast 05/27/2024. CT head without contrast 05/19/2023. CLINICAL HISTORY: Status post revascularization of occluded left ICA. Stroke, follow up. FINDINGS: BRAIN AND VENTRICLES: Remote left occipital and posterior watershed infarcts are stable. Remote cerebellar infarcts are again noted, more prominent on the right. There is no acute intracranial hemorrhage, mass effect or midline shift. No abnormal extra-axial fluid collection. The gray-white differentiation is maintained without an acute infarct. There is no hydrocephalus. ORBITS: The visualized portion of the orbits demonstrate no acute abnormality. SINUSES: Mild mucosal thickening is present in the sphenoid sinuses bilaterally. No fluid levels are present. Mild mucosal thickening is present in the anterior ethmoid air cells and inferior frontal sinuses. The mastoid air cells are clear. SOFT TISSUES AND SKULL: The patient is intubated. OG tube is in place. No acute abnormality of the visualized skull or soft tissues. IMPRESSION: 1. Stable remote left occipital and posterior watershed infarcts. 2. Stable remote cerebellar infarcts, more prominent on the right. Electronically signed by: Audree Leas MD 05/28/2024 05:47 AM EDT RP Workstation: [id number]   CT ANGIO HEAD NECK W WO CM Result Date: 05/28/2024 EXAM: CTA HEAD AND NECK WITH AND WITHOUT 05/28/2024 05:25:22 AM TECHNIQUE: CTA of the head and neck was performed with and without the administration of intravenous contrast. Multiplanar 2D and/or 3D reformatted images are provided for review. Automated exposure control, iterative reconstruction, and/or weight based adjustment of the mA/kV was utilized to reduce the radiation dose to as low as reasonably achievable. Stenosis of the internal carotid arteries measured using NASCET criteria. COMPARISON: CT head without contrast 05/28/2024 at 05:19 am and CT angio  head and neck 05/27/2024 CLINICAL  HISTORY: Neuro deficit, acute, stroke suspected. Status post revascularization of occluded flow diverter stent in the left internal carotid artery. Revascularization of left M1 segment. FINDINGS: CTA NECK: AORTIC ARCH AND ARCH VESSELS: Mild atherosclerotic calcifications are again noted at the aortic arch. The great vessel origins are widely patent. CAROTID ARTERIES: Atherosclerotic changes are present at the right carotid bifurcation without significant stenosis. Mild mural plaque is present in the left common carotid artery and bifurcation without significant stenosis or change. The left internal carotid artery is revascularized. VERTEBRAL ARTERIES: The proximal left vertebral artery is occluded. The vessel is reconstituted in the distal left v2 segment. High-grade stenosis and occlusion of the right vertebral artery at the v3 segment is noted. The left vertebral artery is occluded just below the dural margin, stable. The distal vertebral arteries are reconstituted. SOFT TISSUES: The lung apices are clear. No cervical or superior mediastinal lymphadenopathy. The larynx and pharynx are unremarkable. No acute abnormality of the salivary and thyroid  glands. BONES: No acute osseous abnormality. CTA HEAD: ANTERIOR CIRCULATION: Atherosclerotic calcifications are again noted in the supraclinoid right ICA without significant stenosis through the ICA terminus. The left cavernous ICA stent is patent. The A1 and M1 segments are patent. Mild proximal left M1 segment narrowing is present. The ACA and MCA branch vessels opacify normally on both sides. POSTERIOR CIRCULATION: The basilar artery is small. Prominent iliac vessels are again noted. The superior cerebellar arteries are patent bilaterally, with posterior cerebral arteries originating from the basilar tip. Moderate attenuation of distal PCA branch vessels is present bilaterally, worse on the right. OTHER: No dural venous sinus thrombosis on this non-dedicated study.  BRAIN: There is no acute infarct or acute intracranial hemorrhage. No mass effect or midline shift. IMPRESSION: 1. Status post revascularization of occluded flow diverter stent in the left internal carotid artery and revascularization of left M1 segment. Mild proximal left M1 segment narrowing. 2. High-grade stenosis and occlusion of the right vertebral artery at the V3 segment, stable. 3. Mild mural plaque in the left common carotid artery and bifurcation without significant stenosis or change. 4. Atherosclerotic changes at the right carotid bifurcation without significant stenosis. 5. Occlusion of the left vertebral artery just below the dural margin, stable. 6. Moderate attenuation of distal PCA branch vessels bilaterally, worse on the right. Electronically signed by: Audree Leas MD 05/28/2024 05:44 AM EDT RP Workstation: [id number]   CT HEAD POST STROKE FOLLOWUP/TIMED/STAT READ Result Date: 05/27/2024 CLINICAL DATA:  Code stroke.  Neuro deficit, acute, stroke suspected EXAM: CT HEAD WITHOUT CONTRAST TECHNIQUE: Contiguous axial images were obtained from the base of the skull through the vertex without intravenous contrast. RADIATION DOSE REDUCTION: This exam was performed according to the departmental dose-optimization program which includes automated exposure control, adjustment of the mA and/or kV according to patient size and/or use of iterative reconstruction technique. COMPARISON:  CT head May 27, 2024. FINDINGS: Brain: Similar remote infarcts in the left cerebral hemisphere and cerebellum. No evidence of acute large vascular territory infarct, acute hemorrhage, mass lesion, midline shift or hydrocephalus. Vascular: Calcific atherosclerosis. Skull: No acute fracture. Sinuses/Orbits: Paranasal sinus mucosal thickening. No acute orbital findings. ASPECTS [facility] Stroke Program Early CT Score) Total score (0-10 with 10 being normal): 10. IMPRESSION: 1. No evidence of acute intracranial abnormality.   ASPECTS is 10. 2. Similar remote infarcts. Electronically Signed   By: Stevenson Elbe M.D.   On: 05/27/2024 23:04   DG Abd 1 View Result Date: 05/27/2024 CLINICAL  DATA:  OG tube placement EXAM: ABDOMEN - 1 VIEW COMPARISON:  01/20/2019 FINDINGS: Limited field of view for tube placement verification purposes. An enteric tube is present with tip projecting over the right upper quadrant consistent location of the distal stomach or proximal duodenum. Visualized bowel gas pattern is normal. Mild perihilar infiltration is suggested in the lungs. Surgical clip in the right upper quadrant. Residual contrast material in the urinary tract. IMPRESSION: Enteric tube tip projects over the right upper quadrant consistent with location in the distal stomach or proximal duodenum. Electronically Signed   By: Boyce Byes M.D.   On: 05/27/2024 20:24   Portable Chest x-ray Result Date: 05/27/2024 CLINICAL DATA:  Endotracheal tube.  OG tube placement. EXAM: PORTABLE CHEST 1 VIEW COMPARISON:  CT chest 09/02/2022 FINDINGS: Endotracheal tube is present with tip measuring 4.2 cm above the carina. Enteric tube is present. Tip is off the field of view but below the left hemidiaphragm. Shallow inspiration. Cardiac enlargement. Perihilar infiltrates, greater on the left. This may represent edema or pneumonia. No pleural effusion or pneumothorax. Patient is rotated towards the right. IMPRESSION: 1. Appliances appear in satisfactory position. 2. Cardiac enlargement. 3. Perihilar infiltrates, greater on the left, possibly pneumonia or edema. Electronically Signed   By: Boyce Byes M.D.   On: 05/27/2024 20:23   CT ANGIO HEAD NECK W WO CM (CODE STROKE) Result Date: 05/27/2024 CLINICAL DATA:  Neuro deficit, concern for stroke, right-sided weakness. EXAM: CT ANGIOGRAPHY HEAD AND NECK WITH AND WITHOUT CONTRAST TECHNIQUE: Multidetector CT imaging of the head and neck was performed using the standard protocol during bolus administration  of intravenous contrast. Multiplanar CT image reconstructions and MIPs were obtained to evaluate the vascular anatomy. Carotid stenosis measurements (when applicable) are obtained utilizing NASCET criteria, using the distal internal carotid diameter as the denominator. RADIATION DOSE REDUCTION: This exam was performed according to the departmental dose-optimization program which includes automated exposure control, adjustment of the mA and/or kV according to patient size and/or use of iterative reconstruction technique. CONTRAST:  75mL OMNIPAQUE  IOHEXOL  350 MG/ML SOLN COMPARISON:  Same day CT head.  CTA head and neck 03/31/2023. FINDINGS: CTA NECK FINDINGS Aortic arch: Standard configuration of the aortic arch. Imaged portion shows no evidence of aneurysm or dissection. Atherosclerosis of the visualized aortic arch involving the origins of the aortic arch vessels. There is prominent noncalcified atherosclerotic plaque involving the proximal left subclavian artery. Additional noncalcified atherosclerotic plaque involving the brachiocephalic artery origin resulting in mild stenosis. Pulmonary arteries: As permitted by contrast timing, there are no filling defects in the visualized pulmonary arteries. Subclavian arteries: Similar focal occlusion of the proximal left subclavian artery with reconstitution of the distal left subclavian. Right subclavian arteries patent. Right carotid system: Patent from the origin to the skull base. Atherosclerosis at the carotid bifurcation without stenosis greater than 50%. No evidence of dissection. Left carotid system: Patent from the origin to the carotid bifurcation. Multifocal atherosclerosis of the common carotid artery. Redemonstrated left common carotid to left subclavian artery bypass which appears patent. There is noncalcified atherosclerotic plaque at the carotid bifurcation. The external carotid artery branches are patent. There is abrupt occlusion of the proximal cervical  ICA. Vertebral arteries: The right vertebral artery is patent from the origin to the V3 segment which is significantly diminished in caliber similar to prior. There is severely diminished caliber and intraluminal contrast within the intracranial vertebral artery. The left vertebral artery demonstrates intermittent contrast opacification primarily within the V2 segment. The left vertebral  artery origins not well visualized. The left V3 segment is patent with poor contrast opacification of the left V4 segment similar to prior. Skeleton: No acute or aggressive finding noted. Other neck: The visualized airway is patent. No cervical lymphadenopathy. Upper chest: Visualized lung apices are clear. Review of the MIP images confirms the above findings CTA HEAD FINDINGS ANTERIOR CIRCULATION: The left internal carotid artery is occluded from the cervical segment to the distal supraclinoid ICA. There is reconstitution at the ICA terminus likely via the circle-of-Willis. The right internal carotid artery is patent to the ICA terminus. Atherosclerosis of the right carotid siphon with moderate stenosis of the supraclinoid ICA. MCAs: Patent bilaterally. Mild narrowing at the origin of the left M1 segment similar to prior. Moderate stenosis of a proximal M2 inferior division branch of the left MCA. ACAs: Patent bilaterally. Moderate narrowing of the left A2 segment. POSTERIOR CIRCULATION: Small caliber basilar artery similar to prior without evidence of focal occlusion. There is poor contrast opacification of both PCAs similar to prior. The SCA is are visualized proximally. Venous sinuses: As permitted by contrast timing, patent. Anatomic variants: None Review of the MIP images confirms the above findings IMPRESSION: Occlusion of the left internal carotid artery from the proximal cervical segment to the ICA terminus likely secondary to noncalcified atherosclerotic plaque near the carotid bifurcation. Reconstitution of the ICA  terminus via the circle-of-Willis. Multifocal atherosclerosis in the neck. Redemonstrated multifocal occlusion of the left vertebral artery in the neck. Poor contrast opacification of the V3 and V4 segments bilaterally. Similar occlusion of the proximal left subclavian artery with patent left common carotid to left subclavian bypass noted. Intracranial atherosclerotic disease as above. Moderate stenosis of the right supraclinoid ICA. Focal moderate stenosis of a proximal M2 branch of the left MCA. Moderate stenosis of the A2 segment left ACA. These results were called by telephone at the time of interpretation on 05/27/2024 at 4:15 pm to provider Dr. Liam Redhead, who verbally acknowledged these results. Electronically Signed   By: Denny Flack M.D.   On: 05/27/2024 16:16   CT HEAD CODE STROKE WO CONTRAST Result Date: 05/27/2024 CLINICAL DATA:  Code stroke. Neuro deficit, concern for stroke, right-sided weakness. EXAM: CT HEAD WITHOUT CONTRAST TECHNIQUE: Contiguous axial images were obtained from the base of the skull through the vertex without intravenous contrast. RADIATION DOSE REDUCTION: This exam was performed according to the departmental dose-optimization program which includes automated exposure control, adjustment of the mA and/or kV according to patient size and/or use of iterative reconstruction technique. COMPARISON:  CT head and CTA head and neck 03/31/2023. FINDINGS: Brain: No acute intracranial hemorrhage. Similar appearance of encephalomalacia throughout the right cerebral hemisphere particularly in the frontoparietal lobes and superior left occipital lobe. Additional remote infarcts in the bilateral cerebellum. Nonspecific hypoattenuation in the periventricular and subcortical white matter favored to reflect chronic microvascular ischemic changes. No edema, mass effect, or midline shift. The basilar cisterns are patent. Ventricles: Slight ex vacuo dilatation of the right lateral ventricle. No  hydrocephalus. Vascular: Atherosclerosis in the right carotid siphon. Redemonstrated stent in the left carotid siphon. Skull: No acute or aggressive finding. Orbits: Orbits are symmetric. Sinuses: Mucosal thickening in the ethmoid sinuses. Other: Mastoid air cells are clear. ASPECTS [facility] Stroke Program Early CT Score) - Ganglionic level infarction (caudate, lentiform nuclei, internal capsule, insula, M1-M3 cortex): 7 - Supraganglionic infarction (M4-M6 cortex): 3 Total score (0-10 with 10 being normal): 10 IMPRESSION: 1. No CT evidence of acute intracranial abnormality. 2. Similar remote infarcts  in the left cerebral hemisphere and bilateral cerebellum. Chronic microvascular ischemic changes. 3. ASPECTS is 10 These results were called by telephone at the time of interpretation on 05/27/2024 at 3:22 pm to provider Dorenda Gandy , who verbally acknowledged these results. Electronically Signed   By: Denny Flack M.D.   On: 05/27/2024 15:22    Assessment  58 y.o. female with a history of global aphasia with recent mission 6/2 - 6-11 with L MCA infarct secondary to left distal ICA stent thrombosis (initial stent placement 08/2022) and M1 occlusion s/p TNK and neuro IR thrombectomy complicated by R hemiparesis, global aphasia, dysphagia; additional history including HLD, HTN, substance use disorder, tobacco use, Barrett's esophagus, remote pancreatitis s/p EUS in 2014, and COPD who was brought in by SNF via EMS after EMS on commode and appeared to have melena and concern for coffee-ground emesis around her mouth.  She was admitted for sepsis, possible DKA, AKI, acute myocardial injury with encephalopathy, and suspected GI bleed.  GI consulted given concern for coffee-ground emesis  Coffee-ground emesis/melena: - Likely in the setting of DAPT after recent left distal ICA stent thrombosis with M1 occlusion s/p TNK and thrombectomy - Emesis/melena noted PTA, none since admission. - Per review of chart she had  been receiving Plavix  twice daily instead of Ticagrelor  - Hemoglobin was stable yesterday at 13.1.  Decline initially to 7.1 this morning, I ordered a recheck at which she came back 8.1 (some dilution expected given significant amounts of IV fluid given overnight and into the morning).  - Given the degree of drop in hemoglobin without any overt signs of bleeding, would recommend frequent H/H checks and diligent monitoring of overt GI bleeding - Most likely source would be UTI - CT with findings of mild circumferential esophageal wall thickening of the distal esophagus similar to 2020 - Will remain on PPI twice daily at this time as well. - Given light of recent stroke and neurological deficits with concurrent sepsis at this time we will continue to hold off on upper endoscopy unless overt bleeding identified but will need to consider strongly if this develops.  Sepsis: - Suspected urinary source - Unable to completely rule out GI source given CT with fluid-filled bowel suspicious for enteritis - No reported diarrhea per nursing or family - Stool studies recommended if diarrhea develops. - Management per hospitalist  Plan / Recommendations  Continue to monitor for overt GI bleeding Frequent H/H, at least every 8 hours IV PPI twice daily Given high risk of recurrent thrombosis, continue DAPT Continue to believe that risk outweigh benefits at this time in regards to upper endoscopy.  May need to consider CTA if she remains hypotensive and has further drop in hemoglobin requiring transfusion.    LOS: 1 day    06/14/2024, 1:10 PM   Julian Obey, MSN, FNP-BC, AGACNP-BC [facility]

## 2024-06-14 NOTE — Progress Notes (Signed)
 PROGRESS NOTE   Rita [name]  ZOX:[id number] DOB: [birth year] DOA: 06/13/2024 PCP: [facility], [facility]   Chief Complaint  Patient presents with   Hematemesis   Level of care: Stepdown  Brief Admission History:  58 year old female recently discharged from [facility] on 06/05/2024 after being treated for large left MCA territory infarcts due to left distal ICA stent and MCA occlusion status post TNK and IR with TICI3 reperfusion, prior history of stroke, hypertension, hyperlipidemia, cocaine and marijuana abuse, tobacco abuse, dysphagia, PAD, obesity, history of right lower lobe lung mass had initially been recommended to go to CIR however she ended up going to SNF at [facility].  She was supposed to be on aspirin  and Brilinta , Lipitor  and Zetia .  She presented to ED by EMS from [facility] about 10 minutes after they found her on the toilet with what appeared to be dried blood around the mouth and blood in the toilet.  She apparently had some melanotic stool.  Upon review of the MAR from the facility and discussing with our Pharm.D. patient was actually receiving Plavix  75 mg twice daily in addition to aspirin  81 mg daily.  She was severely dehydrated with a blood glucose of 528.  Her lactic acid was severely elevated at 6.9.  Her WBC was 35.0.  She had an AKI with a creatinine of 2.65.  She was hypokalemic with electrolyte abnormalities.  She was briefly placed on an IV insulin  infusion in the ED.  Patient was given IV fluid boluses and empirically given some IV antibiotics and admission was requested.   Assessment and Plan:  Severe Dehydration - Improving  -- likely from severe hyperglycemia (glucotoxicity) -- IV fluids ordered for aggressive volume replacement   Hyperglycemic Hyperosmolar Syndrome -- briefly was on IV insulin  but treatment really is hydration -- aggressive IV fluid hydration   Lactic Acidosis  Metabolic Acidosis  -- suspect from severe dehydration  -- trend and  treat with IV fluids  -- resolved after bicarb IV fluid  -- sepsis has been ruled out   Acute Metabolic Encephalopathy -- improving slowly with treatments   Normocytic anemia -- Hg down likely from hemodilution -- agree with monitoring H/H -- transfuse for Hg<7  -- agree with GI service that risks of continuing DAPT outweigh benefits of holding it if not having severe bleeding; if she has further sig drop in Hg agree with GI to obtain a CTA GI bleed study  Acute Renal Failure - Improving  -- prerenal given severe dehydration  -- aggressive volume replacement has helped to improve renal function  [place] Emesis Upper GI bleed  -- appreciate GI consult and recommendations -- resumed DAPT where we feel risks of re-occlusion outweigh bleeding risk right now if the bleeding is not severe;  -- IV pantoprazole  ordered -- monitor for signs of severe bleeding -- with recent CVA trying to avoid endoscopy at this time  Cerebrovascular disease s/p recent large left MCA infarct -- for some unknown reason patient was being given plavix  75 mg BID instead of brilinta  90 mg BID per [facility] records confirmed with our pharm D -- discussed with GI service, ok to continue DAPT given no severe bleeding and monitor  Hypokalemia -- IV replacement ordered -- check Mg and replete as needed  -- continue to replenish potassium as it has not fully corrected  Right lower lobe lung mass -- pt was referred to outpatient pulmonary nodule clinic during last recent hospitalization at [facility]  Hypomagnesemia -  severe  Hypophosphatemia - severe  -- IV replacement ordered and repleted   DVT prophylaxis: SCDs Code Status: DNR  Family Communication:  Disposition: anticipate return to [facility]    Consultants:  GI  Procedures:  Na Antimicrobials:  Ceftriaxone  6/19 Metronidazole 6/19   Subjective: Seems to be feeling better,  nods yes to that, eating, drinking fluids   Objective: Vitals:    06/14/24 1300 06/14/24 1405 06/14/24 1430 06/14/24 1542  BP: (!) 108/35 (!) 75/42 (!) 95/47   Pulse: 92 (!) 106 96   Resp: (!) 7 18 11    Temp:    98.4 F (36.9 C)  TempSrc:    Oral  SpO2: 95% 99% 98%   Weight:      Height:        Intake/Output Summary (Last 24 hours) at 06/14/2024 1613 Last data filed at 06/14/2024 1156 Gross per 24 hour  Intake 3208 ml  Output 2350 ml  Net 858 ml   Filed Weights   06/13/24 0222 06/14/24 0500  Weight: 98.6 kg 98.5 kg   Examination:  General exam: Appears acutely and chronically ill poor dentition; calm and comfortable, dysphonia   Respiratory system: Clear to auscultation. Respiratory effort normal. Cardiovascular system: normal S1 & S2 heard. No JVD, murmurs, rubs, gallops or clicks. No pedal edema. Gastrointestinal system: Abdomen is nondistended, soft and nontender. No organomegaly or masses felt. Normal bowel sounds heard. Central nervous system: Alert and oriented. Facial droop on right, dysphonia, 4/5 RUE, 5/5 BLEs ambulating with assist.  Extremities: no cyanosis. Skin: No rashes, lesions or ulcers. Psychiatry: Judgement and insight appear normal. Mood & affect appropriate.   Data Reviewed: I have personally reviewed following labs and imaging studies  CBC: Recent Labs  Lab 06/13/24 0236 06/14/24 0411 06/14/24 1137  WBC 35.0* 18.2*  --   NEUTROABS 31.9*  --   --   HGB 13.1 7.2* 8.1*  HCT 39.3 20.9* 23.8*  MCV 88.9 89.3  --   PLT 944* 470*  --     Basic Metabolic Panel: Recent Labs  Lab 06/13/24 0236 06/13/24 0653 06/13/24 1141 06/14/24 0411  NA 132* 137 137 142  K 3.0* 3.2* 3.2* 2.8*  CL 97* 110 105 99  CO2 13* 16* 19* 30  GLUCOSE 515* 142* 198* 160*  BUN 76* 90* 94* 65*  CREATININE 2.65* 1.96* 1.75* 1.22*  CALCIUM  8.3* 7.8* 8.0* 7.7*  MG  --  1.0*  --  1.9  PHOS  --  <1.0*  --  3.0    CBG: Recent Labs  Lab 06/13/24 2104 06/14/24 0030 06/14/24 0408 06/14/24 0730 06/14/24 1124  GLUCAP 151* 153* 160*  163* 174*    Recent Results (from the past 240 hours)  Blood culture (routine x 2)     Status: None (Preliminary result)   Collection Time: 06/13/24  2:24 AM   Specimen: BLOOD  Result Value Ref Range Status   Specimen Description BLOOD BLOOD LEFT ARM  Final   Special Requests   Final    BOTTLES DRAWN AEROBIC AND ANAEROBIC Blood Culture results may not be optimal due to an inadequate volume of blood received in culture bottles   Culture   Final    NO GROWTH 1 DAY Performed at [facility], [address] [place], [place] [address]    Report Status PENDING  Incomplete  Blood culture (routine x 2)     Status: None (Preliminary result)   Collection Time: 06/13/24  2:36 AM   Specimen:  BLOOD  Result Value Ref Range Status   Specimen Description BLOOD BLOOD LEFT HAND  Final   Special Requests   Final    BOTTLES DRAWN AEROBIC AND ANAEROBIC Blood Culture adequate volume   Culture   Final    NO GROWTH 1 DAY Performed at [facility], [address], [place], [place] [address]    Report Status PENDING  Incomplete  Urine Culture     Status: None (Preliminary result)   Collection Time: 06/13/24  2:47 AM   Specimen: Urine, Clean Catch  Result Value Ref Range Status   Specimen Description   Final    URINE, CLEAN CATCH Performed at [facility], [address] [place], [place] [address]    Special Requests   Final    NONE Performed at [facility], [address] [place], [place] [address]    Culture   Final    CULTURE REINCUBATED FOR BETTER GROWTH Performed at [facility], [address] [place], [place] [address]    Report Status PENDING  Incomplete  MRSA Next Gen by PCR, Nasal     Status: None   Collection Time: 06/13/24  8:06 AM   Specimen: Nasal Mucosa; Nasal Swab  Result Value Ref Range Status   MRSA by PCR Next Gen NOT DETECTED NOT DETECTED Final    Comment: (NOTE) The GeneXpert MRSA Assay (FDA approved for NASAL specimens only), is one component of a comprehensive  MRSA colonization surveillance program. It is not intended to diagnose MRSA infection nor to guide or monitor treatment for MRSA infections. Test performance is not FDA approved in patients less than 62 years old. Performed at [facility], [address], [place], [place] [address]      Radiology Studies: CT Renal Stone Study Result Date: 06/13/2024 CLINICAL DATA:  58 year old female with abdomen and flank pain. History of stroke. EXAM: CT ABDOMEN AND PELVIS WITHOUT CONTRAST TECHNIQUE: Multidetector CT imaging of the abdomen and pelvis was performed following the standard protocol without IV contrast. RADIATION DOSE REDUCTION: This exam was performed according to the departmental dose-optimization program which includes automated exposure control, adjustment of the mA and/or kV according to patient size and/or use of iterative reconstruction technique. COMPARISON:  CT Abdomen and Pelvis 01/20/2019. FINDINGS: Lower chest: Normal heart size. Negative lung bases. No pericardial or pleural effusion. Mildly thickened appearance of the distal thoracic esophagus, appears to be circumferential and is similar to the 2020 comparison (series 2, image 4). Hepatobiliary: Chronic cholecystectomy and hepatic steatosis. Pancreas: Partially atrophied since 2020. Spleen: Negative. Adrenals/Urinary Tract: Normal adrenal glands. Nonobstructed kidneys. No nephrolithiasis. Symmetric diminutive ureters. Diminutive bladder. Small pelvic phleboliths. Stomach/Bowel: Fluid-filled small and large bowel loops throughout the abdomen and pelvis, fluid in the large bowel to the rectum. No abnormally dilated loops. No transition point. Moderate fluid distended stomach also. No pneumoperitoneum. No focal mesenteric inflammation identified. Normal appendix identified on coronal image 54. No free fluid. Vascular/Lymphatic: Aortoiliac calcified atherosclerosis. Normal caliber abdominal aorta. No lymphadenopathy. Reproductive: Negative  noncontrast appearance. Other: No pelvis free fluid. Musculoskeletal: Chronic lower lumbar disc degeneration with vacuum disc. No acute osseous abnormality identified. IMPRESSION: 1. Fluid-filled bowel throughout the abdomen and pelvis suggesting Enteritis/Diarrhea. Normal appendix. No evidence of bowel obstruction or perforation. 2. This circumferentially thickened appearance of the distal esophagus, similar to a 2020 CT. Consider acute or chronic Esophagitis. 3. No other acute or inflammatory process identified in the noncontrast abdomen or pelvis. Chronic cholecystectomy and hepatic steatosis. Aortic Atherosclerosis (ICD10-I70.0). Electronically  Signed   By: Marlise Simpers M.D.   On: 06/13/2024 05:09   DG Chest Portable 1 View Result Date: 06/13/2024 CLINICAL DATA:  Suffered a stroke last week. EXAM: PORTABLE CHEST 1 VIEW COMPARISON:  May 28, 2024 FINDINGS: The heart size and mediastinal contours are within normal limits. There is no evidence of acute infiltrate, pleural effusion or pneumothorax. The visualized skeletal structures are unremarkable. IMPRESSION: No active disease. Electronically Signed   By: Virgle Grime M.D.   On: 06/13/2024 03:22   CT Head Wo Contrast Result Date: 06/13/2024 EXAM: CT HEAD WITHOUT CONTRAST 06/13/2024 03:13:05 AM TECHNIQUE: CT of the head was performed without the administration of intravenous contrast. Automated exposure control, iterative reconstruction, and/or weight based adjustment of the mA/kV was utilized to reduce the radiation dose to as low as reasonably achievable. COMPARISON: MRI brain dated 05/28/2024. CLINICAL HISTORY: Mental status change, unknown cause. Pt is drowsy and only responding to name being called at this time. Altered mental status. FINDINGS: BRAIN AND VENTRICLES: There is no acute intracranial hemorrhage, mass effect or midline shift. No abnormal extra-axial fluid collection. The gray-white differentiation is maintained without an acute infarct. There  is no hydrocephalus. Old left PCA (posterior cerebral artery) distribution and MCA (middle cerebral artery) watershed infarcts. Old right cerebellar infarct. Global cortical atrophy. Subcortical and periventricular small vessel ischemic changes. ORBITS: The visualized portion of the orbits demonstrate no acute abnormality. SINUSES: The visualized paranasal sinuses and mastoid air cells demonstrate no acute abnormality. SOFT TISSUES AND SKULL: No acute abnormality of the visualized skull or soft tissues. VASCULATURE: Intracranial atherosclerosis. Left ICA (internal carotid artery) stent. IMPRESSION: 1. No acute intracranial abnormality. 2. Old left PCA distribution and MCA watershed infarcts. Old right cerebellar infarct. 3. Atrophy with small vessel ischemic changes. Electronically signed by: Zadie Herter MD 06/13/2024 03:18 AM EDT RP Workstation: [id number]    Scheduled Meds:  arformoterol   15 mcg Nebulization BID   aspirin  EC  81 mg Oral Daily   atorvastatin   80 mg Oral QHS   budesonide   0.5 mg Nebulization BID   buprenorphine -naloxone   1 tablet Sublingual TID   Chlorhexidine  Gluconate Cloth  6 each Topical Q0600   ezetimibe   10 mg Oral Daily   feeding supplement  237 mL Oral BID BM   insulin  aspart  0-9 Units Subcutaneous Q4H   midodrine  5 mg Oral TID WC   nicotine   14 mg Transdermal Daily   pantoprazole  (PROTONIX ) IV  40 mg Intravenous Q12H   sodium chloride  flush  3 mL Intravenous Q12H   ticagrelor   90 mg Oral BID   Continuous Infusions:  lactated ringers  125 mL/hr at 06/14/24 1500     LOS: 1 day   Critical Care Procedure Note Authorized and Performed by: Olga Berthold MD  Total Critical Care time:  54 mins Due to a high probability of clinically significant, life threatening deterioration, the patient required my highest level of preparedness to intervene emergently and I personally spent this critical care time directly and personally managing the patient.  This critical care  time included obtaining a history; examining the patient, pulse oximetry; ordering and review of studies; arranging urgent treatment with development of a management plan; evaluation of patient's response of treatment; frequent reassessment; and discussions with other providers.  This critical care time was performed to assess and manage the high probability of imminent and life threatening deterioration that could result in multi-organ failure.  It was exclusive of separately billable procedures and treating other  patients and teaching time.    Faustino Hook, MD How to contact the [facility] Attending or Consulting provider 7A - 7P or covering provider during after hours 7P -7A, for this patient?  Check the care team in [facility] and look for a) attending/consulting [facility] provider listed and b) the [facility] team listed Log into [email or web address] to find provider on call.  Locate the [facility] provider you are looking for under [facility] and page to a number that you can be directly reached. If you still have difficulty reaching the provider, please page the [facility] (Director on Call) for the Hospitalists listed on amion for assistance.  06/14/2024, 4:13 PM

## 2024-06-14 NOTE — Plan of Care (Signed)
  Problem: Acute Rehab OT Goals (only OT should resolve) Goal: Pt. Will Perform Grooming Flowsheets (Taken 06/14/2024 1121) Pt Will Perform Grooming:  with modified independence  standing Goal: Pt. Will Perform Upper Body Dressing Flowsheets (Taken 06/14/2024 1121) Pt Will Perform Upper Body Dressing: with modified independence Goal: Pt. Will Perform Lower Body Dressing Flowsheets (Taken 06/14/2024 1121) Pt Will Perform Lower Body Dressing: with modified independence Goal: Pt. Will Transfer To Toilet Flowsheets (Taken 06/14/2024 1121) Pt Will Transfer to Toilet:  with modified independence  ambulating Goal: Pt. Will Perform Toileting-Clothing Manipulation Flowsheets (Taken 06/14/2024 1121) Pt Will Perform Toileting - Clothing Manipulation and hygiene: with modified independence Goal: Pt/Caregiver Will Perform Home Exercise Program Flowsheets (Taken 06/14/2024 1121) Pt/caregiver will Perform Home Exercise Program:  Increased strength  Increased ROM  Right Upper extremity  Independently  Ved Martos OT, MOT

## 2024-06-15 DIAGNOSIS — E860 Dehydration: Secondary | ICD-10-CM | POA: Diagnosis not present

## 2024-06-15 DIAGNOSIS — K922 Gastrointestinal hemorrhage, unspecified: Secondary | ICD-10-CM | POA: Diagnosis not present

## 2024-06-15 DIAGNOSIS — E1100 Type 2 diabetes mellitus with hyperosmolarity without nonketotic hyperglycemic-hyperosmolar coma (NKHHC): Secondary | ICD-10-CM

## 2024-06-15 DIAGNOSIS — A419 Sepsis, unspecified organism: Secondary | ICD-10-CM | POA: Diagnosis not present

## 2024-06-15 DIAGNOSIS — Z66 Do not resuscitate: Secondary | ICD-10-CM | POA: Diagnosis not present

## 2024-06-15 LAB — TYPE AND SCREEN
ABO/RH(D): O POS
Antibody Screen: NEGATIVE
Unit division: 0

## 2024-06-15 LAB — BASIC METABOLIC PANEL WITH GFR
Anion gap: 9 (ref 5–15)
BUN: 31 mg/dL — ABNORMAL HIGH (ref 6–20)
CO2: 28 mmol/L (ref 22–32)
Calcium: 7.8 mg/dL — ABNORMAL LOW (ref 8.9–10.3)
Chloride: 105 mmol/L (ref 98–111)
Creatinine, Ser: 0.89 mg/dL (ref 0.44–1.00)
GFR, Estimated: >60 mL/min (ref >60)
Glucose, Bld: 119 mg/dL — ABNORMAL HIGH (ref 70–99)
Potassium: 3.3 mmol/L — ABNORMAL LOW (ref 3.5–5.1)
Sodium: 142 mmol/L (ref 135–145)

## 2024-06-15 LAB — GLUCOSE, CAPILLARY
Glucose-Capillary: 105 mg/dL — ABNORMAL HIGH (ref 70–99)
Glucose-Capillary: 126 mg/dL — ABNORMAL HIGH (ref 70–99)
Glucose-Capillary: 144 mg/dL — ABNORMAL HIGH (ref 70–99)
Glucose-Capillary: 107 mg/dL — ABNORMAL HIGH (ref 70–99)
Glucose-Capillary: 125 mg/dL — ABNORMAL HIGH (ref 70–99)

## 2024-06-15 LAB — CBC
HCT: 21.1 % — ABNORMAL LOW (ref 36.0–46.0)
Hemoglobin: 7.3 g/dL — ABNORMAL LOW (ref 12.0–15.0)
MCH: 30.5 pg (ref 26.0–34.0)
MCHC: 34.6 g/dL (ref 30.0–36.0)
MCV: 88.3 fL (ref 80.0–100.0)
Platelets: 375 10*3/uL (ref 150–400)
RBC: 2.39 MIL/uL — ABNORMAL LOW (ref 3.87–5.11)
RDW: 15.5 % (ref 11.5–15.5)
WBC: 14.4 10*3/uL — ABNORMAL HIGH (ref 4.0–10.5)
nRBC: 0.1 % (ref 0.0–0.2)

## 2024-06-15 LAB — BPAM RBC
Blood Product Expiration Date: 202507092359
ISSUE DATE / TIME: 202506202159
Unit Type and Rh: 5100

## 2024-06-15 LAB — HEMOGLOBIN AND HEMATOCRIT, BLOOD
HCT: 24.7 % — ABNORMAL LOW (ref 36.0–46.0)
Hemoglobin: 8.3 g/dL — ABNORMAL LOW (ref 12.0–15.0)

## 2024-06-15 LAB — PROCALCITONIN: Procalcitonin: 1.31 ng/mL

## 2024-06-15 MED ORDER — INSULIN ASPART 100 UNIT/ML IJ SOLN: 0.0000 [IU] | Freq: Three times a day (TID) | INTRAMUSCULAR | Status: DC

## 2024-06-15 MED ORDER — LACTATED RINGERS IV SOLN: INTRAVENOUS | Status: DC

## 2024-06-15 MED ORDER — SODIUM CHLORIDE 0.9 % IV SOLN
3.0000 g | Freq: Four times a day (QID) | INTRAVENOUS | Status: DC
  Administered 2024-06-15 – 2024-06-16 (×4): 3 g via INTRAVENOUS
  Filled 2024-06-15 (×3): qty 8
  Filled 2024-06-15: qty 3
  Filled 2024-06-15 (×4): qty 8

## 2024-06-15 NOTE — Progress Notes (Signed)
 Report given to Deanna, LPN on receiving unit.

## 2024-06-15 NOTE — Progress Notes (Signed)
 PROGRESS NOTE   Rita GIANNOTTI  FMW:[id number] DOB: February 15, 1966 DOA: 06/13/2024 PCP: [facility]   Chief Complaint  Patient presents with   Hematemesis   Level of care: Telemetry  Brief Admission History:  58 year old female recently discharged from [facility] on 06/05/2024 after being treated for large left MCA territory infarcts due to left distal ICA stent and MCA occlusion status post TNK and IR with TICI3 reperfusion, prior history of stroke, hypertension, hyperlipidemia, cocaine and marijuana abuse, tobacco abuse, dysphagia, PAD, obesity, history of right lower lobe lung mass had initially been recommended to go to CIR however she ended up going to SNF at [facility].  She was supposed to be on aspirin  and Brilinta , Lipitor  and Zetia .  She presented to ED by EMS from [facility] about 10 minutes after they found her on the toilet with what appeared to be dried blood around the mouth and blood in the toilet.  She apparently had some melanotic stool.  Upon review of the MAR from the facility and discussing with our Pharm.D. patient was actually receiving Plavix  75 mg twice daily in addition to aspirin  81 mg daily.  She was severely dehydrated with a blood glucose of 528.  Her lactic acid was severely elevated at 6.9.  Her WBC was 35.0.  She had an AKI with a creatinine of 2.65.  She was hypokalemic with electrolyte abnormalities.  She was briefly placed on an IV insulin  infusion in the ED.  Patient was given IV fluid boluses and empirically given some IV antibiotics and admission was requested.   Assessment and Plan:  Severe Dehydration - Improving  -- likely from severe hyperglycemia (glucotoxicity) -- IV fluids ordered for aggressive volume replacement   Hyperglycemic Hyperosmolar Syndrome -- briefly was on IV insulin  but treatment really is hydration -- aggressive IV fluid hydration   Lactic Acidosis  Metabolic Acidosis  -- suspect from severe dehydration  -- trend  and treat with IV fluids  -- resolved after bicarb IV fluid  -- sepsis has been ruled out   Acute Metabolic Encephalopathy -- improving slowly with treatments   Aspiration pneumonia -- IV ampicillin /sulbactam ordered  -- aspiration precautions ordered   Normocytic anemia -- Hg down likely from hemodilution -- agree with monitoring H/H -- transfuse for Hg<7  -- agree with GI service that risks of continuing DAPT outweigh benefits of holding it if not having severe bleeding; CTA GI bleed study negative for active bleed -- transfused 1 unit PRBC  -- repeat CBC in AM   Acute Renal Failure - Resolved  -- prerenal given severe dehydration  -- aggressive volume replacement has helped to improve renal function  Coffee Ground Emesis Upper GI bleed  -- appreciate GI consult and recommendations -- resumed DAPT where we feel risks of re-occlusion outweigh bleeding risk right now if the bleeding is not severe;  -- IV pantoprazole  ordered -- monitor for signs of severe bleeding -- with recent CVA trying to avoid endoscopy at this time  Cerebrovascular disease s/p recent large left MCA infarct -- for some unknown reason patient was being given plavix  75 mg BID instead of brilinta  90 mg BID per [facility] records confirmed with our pharm D -- discussed with GI service, ok to continue DAPT given no severe bleeding and monitor  Hypokalemia -- IV replacement ordered -- check Mg and replete as needed  -- continue to replenish potassium as it has not fully corrected  Right lower lobe lung mass -- pt  was referred to outpatient pulmonary nodule clinic during last recent hospitalization at [facility]  Hypomagnesemia - severe  Hypophosphatemia - severe  -- IV replacement ordered and repleted   DVT prophylaxis: SCDs Code Status: DNR  Family Communication:  Disposition: anticipate return to [facility]    Consultants:  GI  Procedures:  Na Antimicrobials:  Ceftriaxone  6/19 Metronidazole  6/19    Subjective: No complaints, she nods affirmatively that she is feeling better    Objective: Vitals:   06/15/24 0500 06/15/24 0600 06/15/24 0717 06/15/24 1044  BP: 97/63 122/73  109/61  Pulse: 81 89  81  Resp: (!) 4 17  18   Temp:   97.8 F (36.6 C) 98.6 F (37 C)  TempSrc:   Oral Oral  SpO2: 96% 100%  100%  Weight:      Height:        Intake/Output Summary (Last 24 hours) at 06/15/2024 1609 Last data filed at 06/15/2024 1403 Gross per 24 hour  Intake 3189.73 ml  Output 1610 ml  Net 1579.73 ml   Filed Weights   06/13/24 0222 06/14/24 0500  Weight: 98.6 kg 98.5 kg   Examination:  General exam: Appears acutely and chronically ill poor dentition; calm and comfortable, dysphonia  MM moist.  Respiratory system: Clear to auscultation. Respiratory effort normal. Cardiovascular system: normal S1 & S2 heard. No JVD, murmurs, rubs, gallops or clicks. No pedal edema. Gastrointestinal system: Abdomen is nondistended, soft and nontender. No organomegaly or masses felt. Normal bowel sounds heard. Central nervous system: Alert and oriented. Facial droop on right, dysphonia, 4/5 RUE, 5/5 BLEs ambulating with assist.  Extremities: no cyanosis. Skin: No rashes, lesions or ulcers. Psychiatry: Judgement and insight appear normal. Mood & affect appropriate.   Data Reviewed: I have personally reviewed following labs and imaging studies  CBC: Recent Labs  Lab 06/13/24 0236 06/14/24 0411 06/14/24 1137 06/14/24 1959 06/15/24 0258 06/15/24 1042  WBC 35.0* 18.2*  --   --  14.4*  --   NEUTROABS 31.9*  --   --   --   --   --   HGB 13.1 7.2* 8.1* 6.5* 7.3* 8.3*  HCT 39.3 20.9* 23.8* 19.0* 21.1* 24.7*  MCV 88.9 89.3  --   --  88.3  --   PLT 944* 470*  --   --  375  --     Basic Metabolic Panel: Recent Labs  Lab 06/13/24 0236 06/13/24 0653 06/13/24 1141 06/14/24 0411 06/15/24 0258  NA 132* 137 137 142 142  K 3.0* 3.2* 3.2* 2.8* 3.3*  CL 97* 110 105 99 105  CO2 13* 16* 19* 30 28   GLUCOSE 515* 142* 198* 160* 119*  BUN 76* 90* 94* 65* 31*  CREATININE 2.65* 1.96* 1.75* 1.22* 0.89  CALCIUM  8.3* 7.8* 8.0* 7.7* 7.8*  MG  --  1.0*  --  1.9  --   PHOS  --  <1.0*  --  3.0  --     CBG: Recent Labs  Lab 06/14/24 1959 06/14/24 2357 06/15/24 0415 06/15/24 0718 06/15/24 1053  GLUCAP 135* 107* 105* 126* 144*    Recent Results (from the past 240 hours)  Blood culture (routine x 2)     Status: None (Preliminary result)   Collection Time: 06/13/24  2:24 AM   Specimen: BLOOD  Result Value Ref Range Status   Specimen Description BLOOD BLOOD LEFT ARM  Final   Special Requests   Final    BOTTLES DRAWN AEROBIC AND ANAEROBIC Blood Culture  results may not be optimal due to an inadequate volume of blood received in culture bottles   Culture   Final    NO GROWTH 2 DAYS Performed at [facility], [address] [place], [place] [address]    Report Status PENDING  Incomplete  Blood culture (routine x 2)     Status: None (Preliminary result)   Collection Time: 06/13/24  2:36 AM   Specimen: BLOOD  Result Value Ref Range Status   Specimen Description BLOOD BLOOD LEFT HAND  Final   Special Requests   Final    BOTTLES DRAWN AEROBIC AND ANAEROBIC Blood Culture adequate volume   Culture   Final    NO GROWTH 2 DAYS Performed at [facility], [address], [place], [place] [address]    Report Status PENDING  Incomplete  Urine Culture     Status: Abnormal (Preliminary result)   Collection Time: 06/13/24  2:47 AM   Specimen: Urine, Clean Catch  Result Value Ref Range Status   Specimen Description   Final    URINE, CLEAN CATCH Performed at [facility], [address], [place], [place] [address]    Special Requests   Final    NONE Performed at [facility], [address] [place], [place] [address]    Culture (A)  Final    >=100,000 COLONIES/mL GRAM NEGATIVE RODS IDENTIFICATION AND SUSCEPTIBILITIES TO FOLLOW Performed at [facility], [address]  [place], [place] [address]    Report Status PENDING  Incomplete  MRSA Next Gen by PCR, Nasal     Status: None   Collection Time: 06/13/24  8:06 AM   Specimen: Nasal Mucosa; Nasal Swab  Result Value Ref Range Status   MRSA by PCR Next Gen NOT DETECTED NOT DETECTED Final    Comment: (NOTE) The GeneXpert MRSA Assay (FDA approved for NASAL specimens only), is one component of a comprehensive MRSA colonization surveillance program. It is not intended to diagnose MRSA infection nor to guide or monitor treatment for MRSA infections. Test performance is not FDA approved in patients less than 59 years old. Performed at [facility], [address] [place], [place] [address]      Radiology Studies: CT Angio Abd/Pel w/ and/or w/o Result Date: 06/14/2024 CLINICAL DATA:  Evaluate for occult/retroperitoneal bleed. Coffee-ground emesis. Anemia. EXAM: CTA ABDOMEN AND PELVIS WITHOUT AND WITH CONTRAST TECHNIQUE: Multidetector CT imaging of the abdomen and pelvis was performed using the standard protocol during bolus administration of intravenous contrast. Multiplanar reconstructed images and MIPs were obtained and reviewed to evaluate the vascular anatomy. RADIATION DOSE REDUCTION: This exam was performed according to the departmental dose-optimization program which includes automated exposure control, adjustment of the mA and/or kV according to patient size and/or use of iterative reconstruction technique. CONTRAST:  OMNIPAQUE  IOHEXOL  350 MG/ML SOLN COMPARISON:  CT abdomen and pelvis 06/03/2024 FINDINGS: VASCULAR Aorta: Normal caliber aorta without aneurysm, dissection, vasculitis or significant stenosis. Celiac: Patent without evidence of aneurysm, dissection, vasculitis or significant stenosis. SMA: Patent without evidence of aneurysm, dissection, vasculitis or significant stenosis. Renals: Both renal arteries are patent without evidence of aneurysm, dissection, vasculitis, fibromuscular dysplasia or  significant stenosis. IMA: Patent without evidence of aneurysm, dissection, vasculitis or significant stenosis. Inflow: Patent without evidence of aneurysm, dissection, vasculitis or significant stenosis. Proximal Outflow: Bilateral common femoral and visualized portions of the superficial and profunda femoral arteries are patent without evidence of aneurysm, dissection, vasculitis or significant stenosis. Veins: No obvious venous abnormality within the  limitations of this arterial phase study. Review of the MIP images confirms the above findings. NON-VASCULAR Lower chest: There is new small amount of right lower lobe airspace disease. Hepatobiliary: The liver is enlarged. Gallbladder surgically absent. No biliary ductal dilatation. Pancreas: Unremarkable. No pancreatic ductal dilatation or surrounding inflammatory changes. Spleen: Normal in size without focal abnormality. Adrenals/Urinary Tract: There subcentimeter cysts in the kidneys. Otherwise, the kidneys, adrenal glands and bladder are within normal limits. Stomach/Bowel: There is diffuse gastric wall thickening versus normal under distension. No surrounding inflammation. Appendix appears normal. No evidence of bowel wall thickening, distention, or inflammatory changes. No active gastrointestinal bleeding identified. There is sigmoid colon diverticulosis Lymphatic: Aortic atherosclerosis. No enlarged abdominal or pelvic lymph nodes. Reproductive: Uterus and bilateral adnexa are unremarkable. Other: No abdominal wall hernia or abnormality. No abdominopelvic ascites. There is no retroperitoneal hematoma. Musculoskeletal: No acute or significant osseous findings. IMPRESSION: 1. No evidence for active gastrointestinal bleeding or retroperitoneal hematoma. 2. New small amount of right lower lobe airspace disease worrisome for pneumonia. 3. Diffuse gastric wall thickening versus normal under distension. Correlate clinically for gastritis. 4. Hepatomegaly. 5. Sigmoid  colon diverticulosis. 6. Aortic atherosclerosis. Aortic Atherosclerosis (ICD10-I70.0). Electronically Signed   By: Greig Pique M.D.   On: 06/14/2024 22:21   Scheduled Meds:  sodium chloride    Intravenous Once   arformoterol   15 mcg Nebulization BID   aspirin  EC  81 mg Oral Daily   atorvastatin   80 mg Oral QHS   budesonide   0.5 mg Nebulization BID   buprenorphine -naloxone   1 tablet Sublingual TID   Chlorhexidine  Gluconate Cloth  6 each Topical Q0600   ezetimibe   10 mg Oral Daily   feeding supplement  237 mL Oral BID BM   insulin  aspart  0-9 Units Subcutaneous Q4H   midodrine   5 mg Oral TID WC   nicotine   14 mg Transdermal Daily   pantoprazole  (PROTONIX ) IV  40 mg Intravenous Q12H   sodium chloride  flush  3 mL Intravenous Q12H   ticagrelor   90 mg Oral BID   Continuous Infusions:  ampicillin -sulbactam (UNASYN ) IV 3 g (06/15/24 1024)   lactated ringers  70 mL/hr at 06/15/24 0738     LOS: 2 days   Time spent: 55 mins  Rita Bellomo Vicci, MD How to contact the [facility] Attending or Consulting provider 7A - 7P or covering provider during after hours 7P -7A, for this patient?  Check the care team in [facility] and look for a) attending/consulting [facility] provider listed and b) the [facility] team listed Log into [email or web address] to find provider on call.  Locate the [facility] provider you are looking for under [facility] and page to a number that you can be directly reached. If you still have difficulty reaching the provider, please page the [facility] (Director on Call) for the Hospitalists listed on amion for assistance.  06/15/2024, 4:09 PM

## 2024-06-15 NOTE — Plan of Care (Signed)

## 2024-06-15 NOTE — Plan of Care (Signed)

## 2024-06-16 DIAGNOSIS — Z66 Do not resuscitate: Secondary | ICD-10-CM | POA: Diagnosis not present

## 2024-06-16 DIAGNOSIS — A419 Sepsis, unspecified organism: Secondary | ICD-10-CM | POA: Diagnosis not present

## 2024-06-16 DIAGNOSIS — K922 Gastrointestinal hemorrhage, unspecified: Secondary | ICD-10-CM | POA: Diagnosis not present

## 2024-06-16 DIAGNOSIS — E860 Dehydration: Secondary | ICD-10-CM | POA: Diagnosis not present

## 2024-06-16 LAB — CBC WITH DIFFERENTIAL/PLATELET
Abs Immature Granulocytes: 0.17 10*3/uL — ABNORMAL HIGH (ref 0.00–0.07)
Basophils Absolute: 0 10*3/uL (ref 0.0–0.1)
Basophils Relative: 0 %
Eosinophils Absolute: 0.3 10*3/uL (ref 0.0–0.5)
Eosinophils Relative: 3 %
HCT: 23.6 % — ABNORMAL LOW (ref 36.0–46.0)
Hemoglobin: 7.9 g/dL — ABNORMAL LOW (ref 12.0–15.0)
Immature Granulocytes: 1 %
Lymphocytes Relative: 28 %
Lymphs Abs: 3.6 10*3/uL (ref 0.7–4.0)
MCH: 30.4 pg (ref 26.0–34.0)
MCHC: 33.5 g/dL (ref 30.0–36.0)
MCV: 90.8 fL (ref 80.0–100.0)
Monocytes Absolute: 0.7 10*3/uL (ref 0.1–1.0)
Monocytes Relative: 6 %
Neutro Abs: 7.9 10*3/uL — ABNORMAL HIGH (ref 1.7–7.7)
Neutrophils Relative %: 62 %
Platelets: 439 10*3/uL — ABNORMAL HIGH (ref 150–400)
RBC: 2.6 MIL/uL — ABNORMAL LOW (ref 3.87–5.11)
RDW: 15.8 % — ABNORMAL HIGH (ref 11.5–15.5)
WBC: 12.7 10*3/uL — ABNORMAL HIGH (ref 4.0–10.5)
nRBC: 0.2 % (ref 0.0–0.2)

## 2024-06-16 LAB — BASIC METABOLIC PANEL WITH GFR
Anion gap: 9 (ref 5–15)
BUN: 19 mg/dL (ref 6–20)
CO2: 27 mmol/L (ref 22–32)
Calcium: 7.9 mg/dL — ABNORMAL LOW (ref 8.9–10.3)
Chloride: 106 mmol/L (ref 98–111)
Creatinine, Ser: 0.85 mg/dL (ref 0.44–1.00)
GFR, Estimated: >60 mL/min (ref >60)
Glucose, Bld: 113 mg/dL — ABNORMAL HIGH (ref 70–99)
Potassium: 3.1 mmol/L — ABNORMAL LOW (ref 3.5–5.1)
Sodium: 142 mmol/L (ref 135–145)

## 2024-06-16 LAB — TECHNOLOGIST SMEAR REVIEW

## 2024-06-16 LAB — URINE CULTURE: Culture: >=100000 — AB

## 2024-06-16 LAB — LACTATE DEHYDROGENASE: LDH: 157 U/L (ref 98–192)

## 2024-06-16 LAB — GLUCOSE, CAPILLARY
Glucose-Capillary: 107 mg/dL — ABNORMAL HIGH (ref 70–99)
Glucose-Capillary: 120 mg/dL — ABNORMAL HIGH (ref 70–99)
Glucose-Capillary: 122 mg/dL — ABNORMAL HIGH (ref 70–99)

## 2024-06-16 LAB — MAGNESIUM: Magnesium: 1.7 mg/dL (ref 1.7–2.4)

## 2024-06-16 MED ORDER — POLYETHYLENE GLYCOL 3350 17 G PO PACK: 17.0000 g | PACK | Freq: Every day | ORAL | 2 refills | Status: DC | PRN

## 2024-06-16 MED ORDER — ENSURE PLUS HIGH PROTEIN PO LIQD: 237.0000 mL | Freq: Two times a day (BID) | ORAL | 0 refills | Status: DC

## 2024-06-16 MED ORDER — BUDESONIDE 0.5 MG/2ML IN SUSP: 0.5000 mg | Freq: Two times a day (BID) | RESPIRATORY_TRACT | 2 refills | Status: DC

## 2024-06-16 MED ORDER — METFORMIN HCL ER 500 MG PO TB24
500.0000 mg | ORAL_TABLET | Freq: Every day | ORAL | 1 refills | Status: DC
Start: 2024-06-16 — End: 2024-06-16

## 2024-06-16 MED ORDER — IPRATROPIUM-ALBUTEROL 0.5-2.5 (3) MG/3ML IN SOLN: 3.0000 mL | RESPIRATORY_TRACT | 1 refills | Status: DC | PRN

## 2024-06-16 MED ORDER — BUPRENORPHINE HCL-NALOXONE HCL 8-2 MG SL FILM: 1.0000 | ORAL_FILM | Freq: Three times a day (TID) | SUBLINGUAL | 0 refills | Status: DC

## 2024-06-16 MED ORDER — EZETIMIBE 10 MG PO TABS: 10.0000 mg | ORAL_TABLET | Freq: Every day | ORAL | 2 refills | Status: DC

## 2024-06-16 MED ORDER — PANTOPRAZOLE SODIUM 40 MG PO TBEC: 40.0000 mg | DELAYED_RELEASE_TABLET | Freq: Every day | ORAL | 2 refills | Status: DC

## 2024-06-16 MED ORDER — POTASSIUM CHLORIDE CRYS ER 20 MEQ PO TBCR
40.0000 meq | EXTENDED_RELEASE_TABLET | Freq: Once | ORAL | Status: AC
  Administered 2024-06-16: 40 meq via ORAL
  Filled 2024-06-16: qty 2

## 2024-06-16 MED ORDER — ATORVASTATIN CALCIUM 80 MG PO TABS: 80.0000 mg | ORAL_TABLET | Freq: Every day | ORAL | 2 refills | Status: DC

## 2024-06-16 MED ORDER — ATORVASTATIN CALCIUM 80 MG PO TABS
80.0000 mg | ORAL_TABLET | Freq: Every day | ORAL | 2 refills | Status: DC
Start: 2024-06-16 — End: 2024-06-16

## 2024-06-16 MED ORDER — POLYETHYLENE GLYCOL 3350 17 G PO PACK
17.0000 g | PACK | Freq: Every day | ORAL | 2 refills | Status: DC | PRN
Start: 2024-06-16 — End: 2024-06-21

## 2024-06-16 MED ORDER — NICOTINE 14 MG/24HR TD PT24: 14.0000 mg | MEDICATED_PATCH | Freq: Every day | TRANSDERMAL | 0 refills | Status: DC

## 2024-06-16 MED ORDER — TICAGRELOR 90 MG PO TABS: 90.0000 mg | ORAL_TABLET | Freq: Two times a day (BID) | ORAL | 2 refills | Status: DC

## 2024-06-16 MED ORDER — DOCUSATE SODIUM 100 MG PO CAPS: 100.0000 mg | ORAL_CAPSULE | Freq: Two times a day (BID) | ORAL | Status: DC | PRN

## 2024-06-16 MED ORDER — NICOTINE 14 MG/24HR TD PT24
14.0000 mg | MEDICATED_PATCH | Freq: Every day | TRANSDERMAL | 0 refills | Status: DC
Start: 2024-06-16 — End: 2024-06-21

## 2024-06-16 MED ORDER — AMLODIPINE BESYLATE 5 MG PO TABS: 5.0000 mg | ORAL_TABLET | Freq: Every day | ORAL | 2 refills | Status: DC

## 2024-06-16 MED ORDER — METFORMIN HCL ER 500 MG PO TB24: 500.0000 mg | ORAL_TABLET | Freq: Every day | ORAL | 1 refills | Status: DC

## 2024-06-16 MED ORDER — ARFORMOTEROL TARTRATE 15 MCG/2ML IN NEBU: 15.0000 ug | INHALATION_SOLUTION | Freq: Two times a day (BID) | RESPIRATORY_TRACT | 1 refills | Status: DC

## 2024-06-16 MED ORDER — MAGNESIUM SULFATE 4 GM/100ML IV SOLN
4.0000 g | Freq: Once | INTRAVENOUS | Status: AC
  Administered 2024-06-16: 4 g via INTRAVENOUS
  Filled 2024-06-16: qty 100

## 2024-06-16 MED ORDER — AMOXICILLIN-POT CLAVULANATE 875-125 MG PO TABS: 1.0000 | ORAL_TABLET | Freq: Two times a day (BID) | ORAL | 0 refills | Status: AC

## 2024-06-16 MED ORDER — AMOXICILLIN-POT CLAVULANATE 875-125 MG PO TABS: 1.0000 | ORAL_TABLET | Freq: Two times a day (BID) | ORAL | 0 refills | Status: DC

## 2024-06-16 MED ORDER — AMOXICILLIN-POT CLAVULANATE 875-125 MG PO TABS
1.0000 | ORAL_TABLET | Freq: Two times a day (BID) | ORAL | Status: DC
  Administered 2024-06-16: 1 via ORAL
  Filled 2024-06-16: qty 1

## 2024-06-16 NOTE — Discharge Instructions (Addendum)
 What is the Dysphagia Level 1 Diet? The Dysphagia Level 1 diet is designed for people who have trouble swallowing (dysphagia). This diet includes only foods that are completely smooth and pureed, with no lumps, chunks, or pieces. Foods should be the consistency of pudding or mashed potatoes. This helps lower the risk of choking or food going down the wrong way (aspiration).[2][4]  What Can Be Eaten?  Pureed meats, vegetables, and fruits (no lumps)  Smooth puddings, custards, and yogurts (without fruit pieces)  Mashed potatoes or pureed cereals (no lumps)  No bread, crackers, or foods that need chewing  What are Nectar-Thickened Liquids? Nectar-thickened liquids are drinks that are thicker than water  but not as thick as honey. They have a texture similar to a thick fruit nectar or a milkshake that has started to melt. These liquids move more slowly than thin liquids, making them easier and safer to swallow for people with dysphagia.  Why Use Nectar-Thickened Liquids? Thickening liquids can help prevent drinks from entering the airway, which can cause coughing or pneumonia. Studies show that nectar-thick liquids are often enough to help people who can swallow pureed foods safely but have trouble with thin liquids.[3][5-6] The [employer] and the European Society for Swallowing Disorders both recommend thickened liquids to reduce the risk of aspiration in people with swallowing problems.  How Thick is Nectar-Thick? Nectar-thick liquids have a viscosity (thickness) between 51 and 350 mPas, according to the IDDSI standards.[1][7] This means they are thicker than water  but still pourable. How to Prepare Nectar-Thickened Liquids  Use commercial thickening powders or gels as directed on the package.  Mix until the drink is smooth and even.  Test the thickness: Nectar-thick liquids should slowly drip off a spoon but not hold their shape. Important Tips  Always follow the  instructions from your healthcare team or speech-language pathologist.  Do not eat foods or drink liquids that are not allowed on your diet.  If you have trouble swallowing, cough, or feel like food or drink is going down the wrong way, tell your healthcare provider right away. Staying Healthy Eating the right texture and thickness helps keep you safe and well-nourished. If you have questions about your diet, ask your healthcare team for help    IMPORTANT INFORMATION: PAY CLOSE ATTENTION   PHYSICIAN DISCHARGE INSTRUCTIONS  Follow with Primary care provider  [facility]  and other consultants as instructed by your Hospitalist Physician  SEEK MEDICAL CARE OR RETURN TO EMERGENCY ROOM IF SYMPTOMS COME BACK, WORSEN OR NEW PROBLEM DEVELOPS   Please note: You were cared for by a hospitalist during your hospital stay. Every effort will be made to forward records to your primary care provider.  You can request that your primary care provider send for your hospital records if they have not received them.  Once you are discharged, your primary care physician will handle any further medical issues. Please note that NO REFILLS for any discharge medications will be authorized once you are discharged, as it is imperative that you return to your primary care physician (or establish a relationship with a primary care physician if you do not have one) for your post hospital discharge needs so that they can reassess your need for medications and monitor your lab values.  Please get a complete blood count and chemistry panel checked by your Primary MD at your next visit, and again as instructed by your Primary MD.  Get Medicines reviewed and adjusted: Please take all  your medications with you for your next visit with your Primary MD  Laboratory/radiological data: Please request your Primary MD to go over all hospital tests and procedure/radiological results at the follow up, please ask your  primary care provider to get all Hospital records sent to his/her office.  In some cases, they will be blood work, cultures and biopsy results pending at the time of your discharge. Please request that your primary care provider follow up on these results.  If you are diabetic, please bring your blood sugar readings with you to your follow up appointment with primary care.    Please call and make your follow up appointments as soon as possible.    Also Note the following: If you experience worsening of your admission symptoms, develop shortness of breath, life threatening emergency, suicidal or homicidal thoughts you must seek medical attention immediately by calling 911 or calling your MD immediately  if symptoms less severe.  You must read complete instructions/literature along with all the possible adverse reactions/side effects for all the Medicines you take and that have been prescribed to you. Take any new Medicines after you have completely understood and accpet all the possible adverse reactions/side effects.   Do not drive when taking Pain medications or sleeping medications (Benzodiazepines)  Do not take more than prescribed Pain, Sleep and Anxiety Medications. It is not advisable to combine anxiety,sleep and pain medications without talking with your primary care practitioner  Special Instructions: If you have smoked or chewed Tobacco  in the last 2 yrs please stop smoking, stop any regular Alcohol   and or any Recreational drug use.  Wear Seat belts while driving.  Do not drive if taking any narcotic, mind altering or controlled substances or recreational drugs or alcohol .

## 2024-06-16 NOTE — Discharge Summary (Addendum)
 Physician Discharge Summary  Rita [name] FMW:[id number] DOB: 02/09/1966 DOA: 06/13/2024  PCP: [facility]  Admit date: 06/13/2024 Discharge date: 06/16/2024  Admitted From: [facility]  Disposition: Home with Home Health/24 hours supervision  Recommendations for Outpatient Follow-up:  Follow up with PCP in 1-2 weeks Follow up with [facility] Neurology in 4 weeks  Follow up with [facility] IR NeuroRadiology as scheduled Follow up with pain management for suboxone  treatments   Please obtain BMP/CBC/Mg/Phos in 1-2 weeks Please follow up with [facility] in 3 weeks   Home Health: PT/OT/SLP/RN/Aide  (Pt declined to return to SNF)  Discharge Condition: STABLE   CODE STATUS: [id number] DIET: dysphagia 1 diet with nectar thickened liquids   ASPIRATION PRECAUTIONS   Brief Hospitalization Summary: Please see all hospital notes, images, labs for full details of the hospitalization. Admission provider HPI:  58 year old female recently discharged from [facility] on 06/05/2024 after being treated for large left MCA territory infarcts due to left distal ICA stent and MCA occlusion status post TNK and IR with TICI3 reperfusion, prior history of stroke, hypertension, hyperlipidemia, cocaine and marijuana abuse, tobacco abuse, dysphagia, PAD, obesity, history of right lower lobe lung mass had initially been recommended to go to CIR however she ended up going to SNF at [facility].  She was supposed to be on aspirin  and Brilinta , Lipitor  and Zetia .  She presented to ED by EMS from [facility] about 10 minutes after they found her on the toilet with what appeared to be dried blood around the mouth and blood in the toilet.  She apparently had some melanotic stool.  Upon review of the MAR from the facility and discussing with our Pharm.D. patient was actually receiving Plavix  75 mg twice daily in addition to aspirin  81 mg daily.  She was severely dehydrated with a blood glucose of 528.  Her lactic acid  was severely elevated at 6.9.  Her WBC was 35.0.  She had an AKI with a creatinine of 2.65.  She was hypokalemic with electrolyte abnormalities.  She was briefly placed on an IV insulin  infusion in the ED.  Patient was given IV fluid boluses and empirically given some IV antibiotics and admission was requested.  Hospital Course by listed problems addressed   Severe Dehydration - Treated and resolved now  -- likely from severe hyperglycemia (glucotoxicity) -- IV fluids ordered for aggressive volume replacement  -- pt is eating and drinking well with no difficulty    Hyperglycemic Hyperosmolar Syndrome -- briefly was on IV insulin  but treatment really is hydration -- aggressive IV fluid hydration  was given -- blood sugars have been optimally controlled    Prediabetes mellitus -- A1c 6.3%  -- low carb diet encouraged -- given presentation of HHS trial of metformin ER 500 mg with supper -- follow up with PCP for ongoing management   Lactic Acidosis  Metabolic Acidosis  -- RESOLVED  -- suspect from severe dehydration  -- trended and treated with IV fluids  -- resolved after bicarb IV fluid  -- sepsis has been ruled out    Acute Metabolic Encephalopathy -- RESOLVED  -- improved slowly with treatments  -- pt now back to baseline    Aspiration pneumonia -- IV ampicillin /sulbactam ordered and will complete 3 more doses of oral augmentin  to complete course  -- aspiration precautions ordered    Normocytic anemia -- Hg down likely from hemodilution -- agree with monitoring H/H -- transfuse for Hg<7  -- agree with GI service  that risks of continuing DAPT outweigh benefits of holding it if not having severe bleeding; CTA GI bleed study negative for active bleed -- transfused 1 unit PRBC  -- Hg stable at 7.9 -- GI ordering LDH, haptoglobin and smear to rule out hemolysis -- outpatient follow up with [facility] in 3 weeks    Acute Renal Failure - Resolved  -- prerenal given severe  dehydration  -- aggressive volume replacement has helped to improve renal function -- creatinine return to normal level    Coffee Ground Emesis Upper GI bleed  -- appreciate GI consult and recommendations -- likely from inappropriate meds given at SNF, she was being given aspirin  daily and plavix  75 mg BID and neurology had her on brilinta  BID and was not supposed to be on plavix  -- resumed DAPT (aspirin /brilinta ) where we feel risks of re-occlusion outweigh bleeding risk right now if the bleeding is not severe; She doesn't have evidence of ongoing bleeding and has been stable on the aspirin /brilinta  in the hospital  -- IV pantoprazole  ordered in hospital, resume home oral pantoprazole  at discharge  -- monitor for signs of severe bleeding -- with recent CVA trying to avoid endoscopy at this time -- outpatient follow up with [facility] in 3 weeks recommended    Cerebrovascular disease s/p recent large left MCA infarct -- for some unknown reason patient was being given plavix  75 mg BID instead of brilinta  90 mg BID per [facility] records confirmed with our pharm D -- discussed with GI service, ok to continue DAPT given no severe bleeding and monitor -- ambulatory referral made to [facility] neurology for 1 month  -- pt declines to return to [facility] but agreeable to going home with sister and will accept home health services, TOC ordered for home health RN, PT/OT/SLP/Aide -- sister reports she will provide 24/7 supervision     Hypokalemia  Hypomagnesemia  -- IV and replacement ordered and repleted  -- recommend rechecking BMP and Mg in 1-2 weeks    Right lower lobe lung mass -- pt was referred to outpatient pulmonary nodule clinic during last recent hospitalization at [facility]   Hypomagnesemia - severe  Hypophosphatemia - severe  -- IV replacement ordered and repleted    Essential Hypertension -- resumed home medication and noted good BP control in hospital   Opioid Dependence -- pt  is on suboxone  TID -- pt to follow up with her pain management provider for suboxone  treatments  -- Rx given for 7 days supply, last Rx from pain management provider was 05/01/24   Discharge Diagnoses:  Active Problems:   Hyperosmolar hyperglycemic state (HHS) (HCC)   Severe dehydration   Coffee ground emesis   Hypomagnesemia   Hypophosphatemia   Mixed hyperlipidemia   Opioid dependence (HCC)   Lung mass   Upper GI bleed   AKI (acute kidney injury) (HCC)   Elevated troponin   Acute encephalopathy   Lactic acidosis   DNR (do not resuscitate)   Discharge Instructions: Discharge Instructions     Ambulatory referral to Neurology   Complete by: As directed    An appointment is requested in approximately: 4 weeks      Allergies as of 06/16/2024   No Known Allergies      Medication List     STOP taking these medications    clopidogrel  75 MG tablet Commonly known as: PLAVIX    insulin  aspart 100 UNIT/ML injection Commonly known as: novoLOG        TAKE  these medications    amLODipine  5 MG tablet Commonly known as: NORVASC  Take 1 tablet (5 mg total) by mouth daily.   amoxicillin -clavulanate 875-125 MG tablet Commonly known as: AUGMENTIN  Take 1 tablet by mouth every 12 (twelve) hours for 3 doses.   arformoterol  15 MCG/2ML Nebu Commonly known as: BROVANA  Take 2 mLs (15 mcg total) by nebulization 2 (two) times daily.   aspirin  81 MG chewable tablet Chew 1 tablet (81 mg total) by mouth daily.   atorvastatin  80 MG tablet Commonly known as: LIPITOR  Take 1 tablet (80 mg total) by mouth daily.   budesonide  0.5 MG/2ML nebulizer solution Commonly known as: PULMICORT  Take 2 mLs (0.5 mg total) by nebulization 2 (two) times daily.   Buprenorphine  HCl-Naloxone  HCl 8-2 MG Film Take 1 Film by mouth 3 (three) times daily for 7 days.   docusate sodium  100 MG capsule Commonly known as: COLACE Take 1 capsule (100 mg total) by mouth 2 (two) times daily as needed for mild  constipation.   ezetimibe  10 MG tablet Commonly known as: ZETIA  Take 1 tablet (10 mg total) by mouth daily.   feeding supplement Liqd Take 237 mLs by mouth 2 (two) times daily between meals for 14 days.   ipratropium-albuterol  0.5-2.5 (3) MG/3ML Soln Commonly known as: DUONEB Take 3 mLs by nebulization every 4 (four) hours as needed (shortness of breath, wheezing). What changed: reasons to take this   metFORMIN 500 MG 24 hr tablet Commonly known as: GLUCOPHAGE-XR Take 1 tablet (500 mg total) by mouth daily with supper.   nicotine  14 mg/24hr patch Commonly known as: NICODERM CQ  - dosed in mg/24 hours Place 1 patch (14 mg total) onto the skin daily.   pantoprazole  40 MG tablet Commonly known as: PROTONIX  Take 1 tablet (40 mg total) by mouth daily.   polyethylene glycol 17 g packet Commonly known as: MIRALAX  / GLYCOLAX  Take 17 g by mouth daily as needed for moderate constipation.   ticagrelor  90 MG Tabs tablet Commonly known as: BRILINTA  Take 1 tablet (90 mg total) by mouth 2 (two) times daily.        Follow-up Information     [facility] Schedule an appointment as soon as possible for a visit in 1 week(s).   Why: Hospital Follow Up Contact information: [address] [place] [address] [phone]         [facility]. Schedule an appointment as soon as possible for a visit in 1 month(s).   Specialty: Neurology Why: Hospital Follow Up Contact information: [address] [place]  [address] [phone]               No Known Allergies Allergies as of 06/16/2024   No Known Allergies      Medication List     STOP taking these medications    clopidogrel  75 MG tablet Commonly known as: PLAVIX    insulin  aspart 100 UNIT/ML injection Commonly known as: novoLOG        TAKE these medications    amLODipine  5 MG tablet Commonly known as: NORVASC  Take 1 tablet (5 mg  total) by mouth daily.   amoxicillin -clavulanate 875-125 MG tablet Commonly known as: AUGMENTIN  Take 1 tablet by mouth every 12 (twelve) hours for 3 doses.   arformoterol  15 MCG/2ML Nebu Commonly known as: BROVANA  Take 2 mLs (15 mcg total) by nebulization 2 (two) times daily.   aspirin  81 MG chewable tablet Chew 1 tablet (81 mg total) by mouth  daily.   atorvastatin  80 MG tablet Commonly known as: LIPITOR  Take 1 tablet (80 mg total) by mouth daily.   budesonide  0.5 MG/2ML nebulizer solution Commonly known as: PULMICORT  Take 2 mLs (0.5 mg total) by nebulization 2 (two) times daily.   Buprenorphine  HCl-Naloxone  HCl 8-2 MG Film Take 1 Film by mouth 3 (three) times daily for 7 days.   docusate sodium  100 MG capsule Commonly known as: COLACE Take 1 capsule (100 mg total) by mouth 2 (two) times daily as needed for mild constipation.   ezetimibe  10 MG tablet Commonly known as: ZETIA  Take 1 tablet (10 mg total) by mouth daily.   feeding supplement Liqd Take 237 mLs by mouth 2 (two) times daily between meals for 14 days.   ipratropium-albuterol  0.5-2.5 (3) MG/3ML Soln Commonly known as: DUONEB Take 3 mLs by nebulization every 4 (four) hours as needed (shortness of breath, wheezing). What changed: reasons to take this   metFORMIN 500 MG 24 hr tablet Commonly known as: GLUCOPHAGE-XR Take 1 tablet (500 mg total) by mouth daily with supper.   nicotine  14 mg/24hr patch Commonly known as: NICODERM CQ  - dosed in mg/24 hours Place 1 patch (14 mg total) onto the skin daily.   pantoprazole  40 MG tablet Commonly known as: PROTONIX  Take 1 tablet (40 mg total) by mouth daily.   polyethylene glycol 17 g packet Commonly known as: MIRALAX  / GLYCOLAX  Take 17 g by mouth daily as needed for moderate constipation.   ticagrelor  90 MG Tabs tablet Commonly known as: BRILINTA  Take 1 tablet (90 mg total) by mouth 2 (two) times daily.        Procedures/Studies: CT Angio Abd/Pel w/ and/or  w/o Result Date: 06/14/2024 CLINICAL DATA:  Evaluate for occult/retroperitoneal bleed. Coffee-ground emesis. Anemia. EXAM: CTA ABDOMEN AND PELVIS WITHOUT AND WITH CONTRAST TECHNIQUE: Multidetector CT imaging of the abdomen and pelvis was performed using the standard protocol during bolus administration of intravenous contrast. Multiplanar reconstructed images and MIPs were obtained and reviewed to evaluate the vascular anatomy. RADIATION DOSE REDUCTION: This exam was performed according to the departmental dose-optimization program which includes automated exposure control, adjustment of the mA and/or kV according to patient size and/or use of iterative reconstruction technique. CONTRAST:  OMNIPAQUE  IOHEXOL  350 MG/ML SOLN COMPARISON:  CT abdomen and pelvis 06/03/2024 FINDINGS: VASCULAR Aorta: Normal caliber aorta without aneurysm, dissection, vasculitis or significant stenosis. Celiac: Patent without evidence of aneurysm, dissection, vasculitis or significant stenosis. SMA: Patent without evidence of aneurysm, dissection, vasculitis or significant stenosis. Renals: Both renal arteries are patent without evidence of aneurysm, dissection, vasculitis, fibromuscular dysplasia or significant stenosis. IMA: Patent without evidence of aneurysm, dissection, vasculitis or significant stenosis. Inflow: Patent without evidence of aneurysm, dissection, vasculitis or significant stenosis. Proximal Outflow: Bilateral common femoral and visualized portions of the superficial and profunda femoral arteries are patent without evidence of aneurysm, dissection, vasculitis or significant stenosis. Veins: No obvious venous abnormality within the limitations of this arterial phase study. Review of the MIP images confirms the above findings. NON-VASCULAR Lower chest: There is new small amount of right lower lobe airspace disease. Hepatobiliary: The liver is enlarged. Gallbladder surgically absent. No biliary ductal dilatation.  Pancreas: Unremarkable. No pancreatic ductal dilatation or surrounding inflammatory changes. Spleen: Normal in size without focal abnormality. Adrenals/Urinary Tract: There subcentimeter cysts in the kidneys. Otherwise, the kidneys, adrenal glands and bladder are within normal limits. Stomach/Bowel: There is diffuse gastric wall thickening versus normal under distension. No surrounding inflammation. Appendix appears normal. No  evidence of bowel wall thickening, distention, or inflammatory changes. No active gastrointestinal bleeding identified. There is sigmoid colon diverticulosis Lymphatic: Aortic atherosclerosis. No enlarged abdominal or pelvic lymph nodes. Reproductive: Uterus and bilateral adnexa are unremarkable. Other: No abdominal wall hernia or abnormality. No abdominopelvic ascites. There is no retroperitoneal hematoma. Musculoskeletal: No acute or significant osseous findings. IMPRESSION: 1. No evidence for active gastrointestinal bleeding or retroperitoneal hematoma. 2. New small amount of right lower lobe airspace disease worrisome for pneumonia. 3. Diffuse gastric wall thickening versus normal under distension. Correlate clinically for gastritis. 4. Hepatomegaly. 5. Sigmoid colon diverticulosis. 6. Aortic atherosclerosis. Aortic Atherosclerosis (ICD10-I70.0). Electronically Signed   By: Greig Pique M.D.   On: 06/14/2024 22:21   CT Renal Stone Study Result Date: 06/13/2024 CLINICAL DATA:  58 year old female with abdomen and flank pain. History of stroke. EXAM: CT ABDOMEN AND PELVIS WITHOUT CONTRAST TECHNIQUE: Multidetector CT imaging of the abdomen and pelvis was performed following the standard protocol without IV contrast. RADIATION DOSE REDUCTION: This exam was performed according to the departmental dose-optimization program which includes automated exposure control, adjustment of the mA and/or kV according to patient size and/or use of iterative reconstruction technique. COMPARISON:  CT  Abdomen and Pelvis 01/20/2019. FINDINGS: Lower chest: Normal heart size. Negative lung bases. No pericardial or pleural effusion. Mildly thickened appearance of the distal thoracic esophagus, appears to be circumferential and is similar to the 2020 comparison (series 2, image 4). Hepatobiliary: Chronic cholecystectomy and hepatic steatosis. Pancreas: Partially atrophied since 2020. Spleen: Negative. Adrenals/Urinary Tract: Normal adrenal glands. Nonobstructed kidneys. No nephrolithiasis. Symmetric diminutive ureters. Diminutive bladder. Small pelvic phleboliths. Stomach/Bowel: Fluid-filled small and large bowel loops throughout the abdomen and pelvis, fluid in the large bowel to the rectum. No abnormally dilated loops. No transition point. Moderate fluid distended stomach also. No pneumoperitoneum. No focal mesenteric inflammation identified. Normal appendix identified on coronal image 54. No free fluid. Vascular/Lymphatic: Aortoiliac calcified atherosclerosis. Normal caliber abdominal aorta. No lymphadenopathy. Reproductive: Negative noncontrast appearance. Other: No pelvis free fluid. Musculoskeletal: Chronic lower lumbar disc degeneration with vacuum disc. No acute osseous abnormality identified. IMPRESSION: 1. Fluid-filled bowel throughout the abdomen and pelvis suggesting Enteritis/Diarrhea. Normal appendix. No evidence of bowel obstruction or perforation. 2. This circumferentially thickened appearance of the distal esophagus, similar to a 2020 CT. Consider acute or chronic Esophagitis. 3. No other acute or inflammatory process identified in the noncontrast abdomen or pelvis. Chronic cholecystectomy and hepatic steatosis. Aortic Atherosclerosis (ICD10-I70.0). Electronically Signed   By: VEAR Hurst M.D.   On: 06/13/2024 05:09   DG Chest Portable 1 View Result Date: 06/13/2024 CLINICAL DATA:  Suffered a stroke last week. EXAM: PORTABLE CHEST 1 VIEW COMPARISON:  May 28, 2024 FINDINGS: The heart size and  mediastinal contours are within normal limits. There is no evidence of acute infiltrate, pleural effusion or pneumothorax. The visualized skeletal structures are unremarkable. IMPRESSION: No active disease. Electronically Signed   By: Suzen Dials M.D.   On: 06/13/2024 03:22   CT Head Wo Contrast Result Date: 06/13/2024 EXAM: CT HEAD WITHOUT CONTRAST 06/13/2024 03:13:05 AM TECHNIQUE: CT of the head was performed without the administration of intravenous contrast. Automated exposure control, iterative reconstruction, and/or weight based adjustment of the mA/kV was utilized to reduce the radiation dose to as low as reasonably achievable. COMPARISON: MRI brain dated 05/28/2024. CLINICAL HISTORY: Mental status change, unknown cause. Pt is drowsy and only responding to name being called at this time. Altered mental status. FINDINGS: BRAIN AND VENTRICLES: There is no acute  intracranial hemorrhage, mass effect or midline shift. No abnormal extra-axial fluid collection. The gray-white differentiation is maintained without an acute infarct. There is no hydrocephalus. Old left PCA (posterior cerebral artery) distribution and MCA (middle cerebral artery) watershed infarcts. Old right cerebellar infarct. Global cortical atrophy. Subcortical and periventricular small vessel ischemic changes. ORBITS: The visualized portion of the orbits demonstrate no acute abnormality. SINUSES: The visualized paranasal sinuses and mastoid air cells demonstrate no acute abnormality. SOFT TISSUES AND SKULL: No acute abnormality of the visualized skull or soft tissues. VASCULATURE: Intracranial atherosclerosis. Left ICA (internal carotid artery) stent. IMPRESSION: 1. No acute intracranial abnormality. 2. Old left PCA distribution and MCA watershed infarcts. Old right cerebellar infarct. 3. Atrophy with small vessel ischemic changes. Electronically signed by: Pinkie Pebbles MD 06/13/2024 03:18 AM EDT RP Workstation: [id number]   DG  Swallowing Func-Speech Pathology Result Date: 05/31/2024 Table formatting from the original result was not included. Modified Barium Swallow Study Patient Details Name: ADALIE MAND MRN: [id number] Date of Birth: 07-08-1966 Today's Date: 05/31/2024 HPI/PMH: HPI: TAMMIE YANDA is a 58 yo female presenting to [facility] with R sided weakness and aphasia. CTA showed occlusion of the L ICA, L vertebral artery and L subclavian artery occlusions. TNK administered and transferred to [facility] for thrombectomy. Remained intubated after the procedure, 6/2-6/4. MRI shows large L MCA ischemic infarct and nonhemorrhagic L lentiform nucleus and periventricular white matter infarcts. PMH includes HTN, HLD, prior CVA s/p L carotid subclavian bypass graft, COPD, tobacco use, GERD with Barrett's esophagus, chronic pancreatitis, chronic opioid use, depression Clinical Impression: Clinical Impression: Swallow study was limited by reduced visibility at the level of the vocal folds and UES due to shadow from shoulder. Pt had difficulty following commands to adjust positioning and even when anatomy was more visible, she did not stay in that position long. Her oral phase was fairly appropriate with liquid and pureed boluses, with only trace residue along the tongue and base of tongue, and minimal interlabial escape, and slow lingual transit with purees. Incomplete velopharyngeal closure was observed with small amounts of boluses flowing upward between the soft palate and posterior pharyngeal wall, but then falling back down into her pharynx. Pharyngeally there is reduced movement of the hyoid, larynx, and epiglottis. Base of tongue retraction and pharyngeal squeeze are reduced. This impacts airway closure more than it does her efficiency. She has pretty consistent penetration across consistencies, with ejection of penetrates with purees (PAS 2) and a little frank penetration with nectar and honey (PAS 3), although with most of the barium clearing  spontaneously. Pt had intermittent coughing with thin liquids that was concerning for aspiration but could not be visualized, but when able to see the vocal folds, there were instances when a trace amount of thin liquids reached below them (PAS 7 or 8). Given current cognitive/communicative difficulties, she is not really appropriate for compensatory strategies at this time. Recommend that she advance to puree diet but with nectar (mildly) thick liquids. Factors that may increase risk of adverse event in presence of aspiration Noe & Lianne 2021): Factors that may increase risk of adverse event in presence of aspiration Noe & Lianne 2021): Reduced cognitive function; Dependence for feeding and/or oral hygiene Recommendations/Plan: Swallowing Evaluation Recommendations Swallowing Evaluation Recommendations Recommendations: PO diet PO Diet Recommendation: Dysphagia 1 (Pureed); Mildly thick liquids (Level 2, nectar thick) Liquid Administration via: Cup; Straw Medication Administration: Crushed with puree Supervision: Staff to assist with self-feeding; Full supervision/cueing for swallowing strategies Swallowing strategies  : Minimize  environmental distractions; Slow rate; Small bites/sips; Check for pocketing or oral holding; Check for anterior loss Postural changes: Position pt fully upright for meals Oral care recommendations: Oral care BID (2x/day) Caregiver Recommendations: Avoid jello, ice cream, thin soups, popsicles; Remove water  pitcher Treatment Plan Treatment Plan Treatment recommendations: Therapy as outlined in treatment plan below Follow-up recommendations: Acute inpatient rehab (3 hours/day) Functional status assessment: Patient has had a recent decline in their functional status and demonstrates the ability to make significant improvements in function in a reasonable and predictable amount of time. Treatment frequency: Min 2x/week Treatment duration: 2 weeks Interventions: Aspiration precaution  training; Compensatory techniques; Patient/family education; Trials of upgraded texture/liquids; Diet toleration management by SLP Recommendations Recommendations for follow up therapy are one component of a multi-disciplinary discharge planning process, led by the attending physician.  Recommendations may be updated based on patient status, additional functional criteria and insurance authorization. Assessment: Orofacial Exam: Orofacial Exam Oral Cavity - Dentition: Edentulous Anatomy: No data recorded Boluses Administered: Boluses Administered Boluses Administered: Thin liquids (Level 0); Mildly thick liquids (Level 2, nectar thick); Moderately thick liquids (Level 3, honey thick); Puree  Oral Impairment Domain: Oral Impairment Domain Lip Closure: Interlabial escape, no progression to anterior lip Tongue control during bolus hold: Posterior escape of greater than half of bolus Bolus preparation/mastication: -- (deferred given minimal mastication observed at bedside) Bolus transport/lingual motion: Slow tongue motion Oral residue: Trace residue lining oral structures Location of oral residue : Tongue Initiation of pharyngeal swallow : Pyriform sinuses  Pharyngeal Impairment Domain: Pharyngeal Impairment Domain Soft palate elevation: Trace column of contrast or air between SP and PW Laryngeal elevation: Partial superior movement of thyroid  cartilage/partial approximation of arytenoids to epiglottic petiole Anterior hyoid excursion: Partial anterior movement Epiglottic movement: Partial inversion Laryngeal vestibule closure: Incomplete, narrow column air/contrast in laryngeal vestibule Pharyngeal stripping wave : Present - diminished Pharyngeal contraction (A/P view only): N/A Pharyngoesophageal segment opening: Complete distension and complete duration, no obstruction of flow (as can be viewed - limited visibility) Tongue base retraction: Narrow column of contrast or air between tongue base and PPW Pharyngeal  residue: Trace residue within or on pharyngeal structures Location of pharyngeal residue: Tongue base  Esophageal Impairment Domain: No data recorded Pill: No data recorded Penetration/Aspiration Scale Score: Penetration/Aspiration Scale Score 2.  Material enters airway, remains ABOVE vocal cords then ejected out: Puree 3.  Material enters airway, remains ABOVE vocal cords and not ejected out: Mildly thick liquids (Level 2, nectar thick); Moderately thick liquids (Level 3, honey thick) 8.  Material enters airway, passes BELOW cords without attempt by patient to eject out (silent aspiration) : Thin liquids (Level 0) Compensatory Strategies: No data recorded  General Information: Caregiver present: No  Diet Prior to this Study: Full liquid diet; Thin liquids (Level 0)   Temperature : Normal   Respiratory Status: WFL   Supplemental O2: None (Room air)   History of Recent Intubation: Yes  Behavior/Cognition: Alert; Cooperative; Requires cueing Self-Feeding Abilities: Needs assist with self-feeding Baseline vocal quality/speech: Not observed No data recorded No data recorded Exam Limitations: Limited visibility Goal Planning: Prognosis for improved oropharyngeal function: Good Barriers to Reach Goals: Cognitive deficits; Language deficits No data recorded Patient/Family Stated Goal: difficulty stating Consulted and agree with results and recommendations: Patient; Nurse; Dietitian Pain: Pain Assessment Pain Assessment: Faces Faces Pain Scale: 0 Facial Expression: 0 Body Movements: 0 Muscle Tension: 0 Compliance with ventilator (intubated pts.): N/A Vocalization (extubated pts.): 0 CPOT Total: 0 End of Session: Start Time:SLP  Start Time (ACUTE ONLY): 1154 Stop Time: SLP Stop Time (ACUTE ONLY): 1205 Time Calculation:SLP Time Calculation (min) (ACUTE ONLY): 11 min Charges: SLP Evaluations $ SLP Speech Visit: 1 Visit SLP Evaluations $MBS Swallow: 1 Procedure $Swallowing Treatment: 1 Procedure SLP visit diagnosis: SLP Visit  Diagnosis: Dysphagia, oropharyngeal phase (R13.12) Past Medical History: Past Medical History: Diagnosis Date  Arthritis   qwhere (02/22/2018)  Barrett's esophagus with esophagitis 03/26/2013  Chronic abdominal pain   Chronic lower back pain   Chronic pancreatitis (HCC)   COPD (chronic obstructive pulmonary disease) (HCC)   Depression   Dyspnea   GERD (gastroesophageal reflux disease)   Heart murmur   Hiatal hernia   High cholesterol   Hypertension   Peptic ulcer   Pneumonia 2000s X 1  Stroke (HCC)   Tobacco use 03/26/2013 Past Surgical History: Past Surgical History: Procedure Laterality Date  AORTIC ARCH ANGIOGRAPHY N/A 01/19/2018  Procedure: AORTIC ARCH ANGIOGRAPHY;  Surgeon: Harvey Carlin BRAVO, MD;  Location: [facility];  Service: Cardiovascular;  Laterality: N/A;  BIOPSY  04/17/2017  Procedure: BIOPSY;  Surgeon: Lamar CHRISTELLA Hollingshead, MD;  Location: [facility];  Service: Endoscopy;;  esophageal  CARDIAC CATHETERIZATION    CAROTID-SUBCLAVIAN BYPASS GRAFT Left 03/19/2018  Procedure: BYPASS GRAFT CAROTID-SUBCLAVIAN;  Surgeon: Harvey Carlin BRAVO, MD;  Location: [facility] OR;  Service: Vascular;  Laterality: Left;  CESAREAN SECTION  1985; 1988  COLONOSCOPY WITH PROPOFOL  N/A 04/17/2017  Procedure: COLONOSCOPY WITH PROPOFOL ;  Surgeon: Lamar CHRISTELLA Hollingshead, MD;  Location: [facility];  Service: Endoscopy;  Laterality: N/A;  100  ESOPHAGOGASTRODUODENOSCOPY  Oct 2011  Dr. Golda: ulcer in distal esophagus, soft stricture at GE junction s/p balloon dilation PATH: BARRETT'S  ESOPHAGOGASTRODUODENOSCOPY (EGD) WITH ESOPHAGEAL DILATION N/A 03/27/2013  Procedure: ESOPHAGOGASTRODUODENOSCOPY (EGD) WITH ESOPHAGEAL DILATION;  Surgeon: Lamar CHRISTELLA Hollingshead, MD;  [facility];  Service: Endoscopy;  Laterality: N/A;  possible dilation  ESOPHAGOGASTRODUODENOSCOPY (EGD) WITH PROPOFOL  N/A 01/02/2017  Procedure: ESOPHAGOGASTRODUODENOSCOPY (EGD) WITH PROPOFOL ;  Surgeon: Lamar CHRISTELLA Hollingshead, MD;  [facility];  Service: Endoscopy;  Laterality:  N/A;  with possible esophageal dilation  ESOPHAGOGASTRODUODENOSCOPY (EGD) WITH PROPOFOL  N/A 04/17/2017  Procedure: ESOPHAGOGASTRODUODENOSCOPY (EGD) WITH PROPOFOL ;  Surgeon: Lamar CHRISTELLA Hollingshead, MD;  [facility];  Service: Endoscopy;  Laterality: N/A;  EUS N/A 05/09/2013  Procedure: UPPER ENDOSCOPIC ULTRASOUND (EUS) LINEAR;  Surgeon: Toribio SHAUNNA Cedar, MD;  Location: [facility];  Service: Endoscopy;  Laterality: N/A;  FRACTURE SURGERY    pt. denies  IR ANGIO INTRA EXTRACRAN SEL COM CAROTID INNOMINATE BILAT MOD SED  09/06/2022  IR ANGIO INTRA EXTRACRAN SEL COM CAROTID INNOMINATE BILAT MOD SED  09/13/2022  IR ANGIO VERTEBRAL SEL SUBCLAVIAN INNOMINATE BILAT MOD SED  09/13/2022  IR ANGIO VERTEBRAL SEL SUBCLAVIAN INNOMINATE UNI R MOD SED  09/06/2022  IR ANGIOGRAM FOLLOW UP STUDY  09/13/2022  IR CT HEAD LTD  09/08/2022  IR CT HEAD LTD  05/27/2024  IR CT HEAD LTD  05/27/2024  IR NEURO EACH ADD'L AFTER BASIC UNI LEFT (MS)  09/13/2022  IR PATIENT EVAL TECH 0-60 MINS  05/28/2024  IR PERCUTANEOUS ART THROMBECTOMY/INFUSION INTRACRANIAL INC DIAG ANGIO  05/27/2024  IR TRANSCATH/EMBOLIZ  09/08/2022  IR US  GUIDE VASC ACCESS RIGHT  09/08/2022  IR US  GUIDE VASC ACCESS RIGHT  09/06/2022  KNEE ARTHROSCOPY Right   LAPAROSCOPIC CHOLECYSTECTOMY    LEFT HEART CATH AND CORONARY ANGIOGRAPHY N/A 02/26/2018  Procedure: LEFT HEART CATH AND CORONARY ANGIOGRAPHY;  Surgeon: Verlin Lonni BIRCH, MD;  Location: [facility]  INVASIVE CV LAB;  Service: Cardiovascular;  Laterality: N/A;  MALONEY DILATION N/A 04/17/2017  Procedure: AGAPITO DILATION;  Surgeon: Lamar CHRISTELLA Hollingshead, MD;  [facility];  Service: Endoscopy;  Laterality: N/A;  PATELLA FRACTURE SURGERY Left   POLYPECTOMY  04/17/2017  Procedure: POLYPECTOMY;  Surgeon: Lamar CHRISTELLA Hollingshead, MD;  [facility];  Service: Endoscopy;;  RADIOLOGY WITH ANESTHESIA N/A 09/08/2022  Procedure: L ICA Aneurysm Embolazation;  Surgeon: Dolphus Carrion, MD;  Location: [facility] OR;  Service: Radiology;  Laterality: N/A;   RADIOLOGY WITH ANESTHESIA N/A 05/27/2024  Procedure: RADIOLOGY WITH ANESTHESIA;  Surgeon: Radiologist, Medication, MD;  Location: [facility] OR;  Service: Radiology;  Laterality: N/A;  TEE WITHOUT CARDIOVERSION N/A 01/23/2019  Procedure: TRANSESOPHAGEAL ECHOCARDIOGRAM (TEE);  Surgeon: Jeffrie Oneil BROCKS, MD;  Location: [facility] ENDOSCOPY;  Service: Cardiovascular;  Laterality: N/A;  TUBAL LIGATION  1992  UPPER EXTREMITY ANGIOGRAPHY N/A 01/19/2018  Procedure: UPPER EXTREMITY ANGIOGRAPHY;  Surgeon: Harvey Carlin BRAVO, MD;  Location: [facility];  Service: Cardiovascular;  Laterality: N/A; Leita SAILOR., M.A. CCC-SLP Acute Rehabilitation Services Office: [phone] Secure chat preferred 05/31/2024, 1:20 PM  DG Abd Portable 1V Result Date: 05/29/2024 CLINICAL DATA:  Feeding tube placement. EXAM: PORTABLE ABDOMEN - 1 VIEW COMPARISON:  May 27, 2024. FINDINGS: Distal tip of feeding tube is seen in expected position of distal stomach or proximal duodenum. IMPRESSION: Feeding tube tip seen in expected position of distal stomach or proximal duodenum. Electronically Signed   By: Lynwood Landy Raddle M.D.   On: 05/29/2024 13:53   IR PERCUTANEOUS ART THROMBECTOMY/INFUSION INTRACRANIAL INC DIAG ANGIO Result Date: 05/29/2024 INDICATION: Acute onset of left gaze deviation, right-sided hemiplegia, right-sided neglect, and aphasia. 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 EXAM: 1. EMERGENT LARGE VESSEL OCCLUSION THROMBOLYSIS (anterior CIRCULATION) COMPARISON:  CT angiogram of the head and neck of 05/27/2024. MEDICATIONS: No antibiotic was administered within 1 hour of the procedure. ANESTHESIA/SEDATION: General anesthesia. CONTRAST:  Omnipaque  300 approximately 100 mL. FLUOROSCOPY TIME:  Fluoroscopy Time: 23 minutes 4 seconds (1502 mGy). COMPLICATIONS: None immediate. TECHNIQUE: An emergent 2 physician consent was obtained in view of the non availability of a patient's next of kin or  relatives physically or from the telephone numbers listed in the patient's chart. The patient was then put under general anesthesia by the Department of Anesthesiology at [facility]. The right groin was prepped and draped in the usual sterile fashion. Thereafter using modified Seldinger technique, transfemoral access into the right common femoral artery was obtained without difficulty. Over an 0.035 inch guidewire an 8 French 25 cm Pinnacle sheath was inserted. Through this, and also over an 0.035 inch guidewire combination of a 125 cm 6 [place] Berenstein support catheter inside of an 088 100 cm Zoom support catheter was advanced to initially the common carotid artery and then to the distal left internal carotid artery at the cervical petrous junction. FINDINGS: The left common carotid arteriogram demonstrates patency of the common carotid artery subclavian bypass supplying the left subclavian artery. The left common carotid artery bifurcation demonstrates the left external carotid artery and its major branches to be widely patent. 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 No distal opacification of the left middle cerebral artery or the left anterior cerebral artery  is evident. PROCEDURE: Through the Zoom support catheter and the distal petrous segment, a combination of an 068 134 cm Penumbra aspiration catheter with a 160 cm 021 Trevo ProVue microcatheter was advanced over an 018 inch Aristotle micro guidewire with a moderate J configuration to the distal end of the 088 support catheter. The micro guidewire was then advanced through the occluded flow diverter device without difficulty into the M2 segment of the inferior division followed by the microcatheter followed by the Zoom 068  aspiration catheter through the pipeline flow diverter device into the left mid M1 segment. The microcatheter and micro guidewire were removed as aspiration was then applied at the hub of the 068 aspiration catheter and at the hub of the 088 support catheter which had been advanced just proximal to the pipeline flow diverter device. The 068 catheter was removed. A control arteriogram performed through the 088 support catheter demonstrated revascularization of the left anterior and left middle cerebral artery distributions. A TICI 2C revascularization was obtained. A control arteriogram through the support catheter revealed a moderate-sized filling defect in the supraclinoid left ICA, and also along the lower edge of the pipeline flow diverter device. A second pass was then made using a 120 cm 070 APRO aspiration catheter which was advanced in combination with an 021 microcatheter over an 018 inch Aristotle micro guidewire. Access was obtained with a microcatheter and micro guidewire in the M2 region of an inferior division branch. The 070 APRO catheter was advanced into the proximal left M1 segment. Aspiration was then applied at the hub of the APRO aspiration catheter, and also the Zoom support catheter following removal of the microcatheter. After a minute and a half of aspiration, the combination was retrieved and removed. A control arteriogram performed through the Zoom support catheter demonstrated moderate improvement in the previously noted filling defects in the supraclinoid left ICA, and also along the lower portion of the flow diverter device. Spasm of the left M1 segment was treated with 3 aliquots of 25 mcg of Nitroglycerin  with complete relief. Over the next few minutes, decrease in the filling defect was noted within the pipeline flow diverter device. Also noted was decreased hemodynamic flow into the left MCA distribution. A follow-up biplane DSA now demonstrated progressively worsening flow through  the left M1 distribution secondary to increasing filling defect within the supraclinoid left ICA, and in the proximal left MCA leading to complete occlusion of the left supraclinoid ICA and the proximal left M1 segment. A third pass was made again using a combination of the APRO 125 070 aspiration catheter advanced again into the proximal M1 segment in conjunction with an 021 microcatheter and the micro guidewire. The micro guidewire and the microcatheter were removed. Aspiration was then applied for a minute and a half through the hub of the APRO intermediate catheter. This was then removed. A DSA through the support catheter now demonstrated improved flow through the supraclinoid left ICA and MCA distribution. Due to the aggressive platelet aggregation, it was decided to proceed with use of IV cangrelor  infusion in order to prevent further platelet aggregation. Left femoral venous access was then obtained due to the patient having only 1 IV at the start of the procedure. Attempts to obtain access via a vein were unsuccessful in both arms. DSAs performed at 10 and 20 minutes post commencement of the IV cangrelor  demonstrated gradual clearing of filling defects in the supraclinoid left ICA, the pipeline flow diverter device, and the in the left MCA  distribution. A final control arteriogram performed through the Zoom support catheter in the left internal artery now demonstrated complete revascularization of the left MCA distribution and of the supraclinoid left ICA achieving a TICI 3 revascularization. The left anterior cerebral artery remained widely patent. A flap panel CT obtained at the end of the procedure demonstrated no evidence of intracranial hemorrhage or mass effect. An 8 French Angio-Seal closure device was deployed for hemostasis at the right groin puncture site. The left 4 French sheath was left in position to continue the 4 hour IV cangrelor  infusion. Distal pulses remained present in both feet at the  end of the procedure. The patient was left intubated due to her medical condition. She was then transferred to neuro ICU for post revascularization care. Medications: Aspirin  81 mg, and Brilinta  90 mg p.o. via a nasogastric tube after the first pass. IV half bolus dose of cangrelor , followed by a half dose infusion over 4 hours. CT scan of the brain to be obtained at the end of 4 hours. IMPRESSION: Status post revascularization of the occluded distal left internal carotid artery supraclinoid segment, and the proximal left middle cerebral M1 segment with 3 passes of contact aspiration achieving a TICI 3 revascularization. PLAN: As per Stroke Neurology. Electronically Signed   By: Thyra Nash M.D.   On: 05/29/2024 08:05   IR CT Head Ltd Result Date: 05/29/2024 INDICATION: Acute onset of left gaze deviation, right-sided hemiplegia, right-sided neglect, and aphasia. 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 EXAM: 1. EMERGENT LARGE VESSEL OCCLUSION THROMBOLYSIS (anterior CIRCULATION) COMPARISON:  CT angiogram of the head and neck of 05/27/2024. MEDICATIONS: No antibiotic was administered within 1 hour of the procedure. ANESTHESIA/SEDATION: General anesthesia. CONTRAST:  Omnipaque  300 approximately 100 mL. FLUOROSCOPY TIME:  Fluoroscopy Time: 23 minutes 4 seconds (1502 mGy). COMPLICATIONS: None immediate. TECHNIQUE: An emergent 2 physician consent was obtained in view of the non availability of a patient's next of kin or relatives physically or from the telephone numbers listed in the patient's chart. The patient was then put under general anesthesia by the Department of Anesthesiology at [facility]. The right groin was prepped and draped in the usual sterile fashion. Thereafter using modified Seldinger technique, transfemoral access into the right common femoral artery was obtained without difficulty. Over an 0.035 inch guidewire an 8  French 25 cm Pinnacle sheath was inserted. Through this, and also over an 0.035 inch guidewire combination of a 125 cm 6 [place] Berenstein support catheter inside of an 088 100 cm Zoom support catheter was advanced to initially the common carotid artery and then to the distal left internal carotid artery at the cervical petrous junction. FINDINGS: The left common carotid arteriogram demonstrates patency of the common carotid artery subclavian bypass supplying the left subclavian artery. The left common carotid artery bifurcation demonstrates the left external carotid artery and its major branches to be widely patent. 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 No distal opacification of the left middle cerebral artery or the left anterior cerebral artery is evident. PROCEDURE: Through the Zoom support catheter and the distal petrous segment, a combination of an 068 134 cm Penumbra aspiration catheter with a 160 cm 021 Trevo ProVue microcatheter was advanced over an 018 inch  Aristotle micro guidewire with a moderate J configuration to the distal end of the 088 support catheter. The micro guidewire was then advanced through the occluded flow diverter device without difficulty into the M2 segment of the inferior division followed by the microcatheter followed by the Zoom 068 aspiration catheter through the pipeline flow diverter device into the left mid M1 segment. The microcatheter and micro guidewire were removed as aspiration was then applied at the hub of the 068 aspiration catheter and at the hub of the 088 support catheter which had been advanced just proximal to the pipeline flow diverter device. The 068 catheter was removed. A control arteriogram performed through the 088 support catheter  demonstrated revascularization of the left anterior and left middle cerebral artery distributions. A TICI 2C revascularization was obtained. A control arteriogram through the support catheter revealed a moderate-sized filling defect in the supraclinoid left ICA, and also along the lower edge of the pipeline flow diverter device. A second pass was then made using a 120 cm 070 APRO aspiration catheter which was advanced in combination with an 021 microcatheter over an 018 inch Aristotle micro guidewire. Access was obtained with a microcatheter and micro guidewire in the M2 region of an inferior division branch. The 070 APRO catheter was advanced into the proximal left M1 segment. Aspiration was then applied at the hub of the APRO aspiration catheter, and also the Zoom support catheter following removal of the microcatheter. After a minute and a half of aspiration, the combination was retrieved and removed. A control arteriogram performed through the Zoom support catheter demonstrated moderate improvement in the previously noted filling defects in the supraclinoid left ICA, and also along the lower portion of the flow diverter device. Spasm of the left M1 segment was treated with 3 aliquots of 25 mcg of Nitroglycerin  with complete relief. Over the next few minutes, decrease in the filling defect was noted within the pipeline flow diverter device. Also noted was decreased hemodynamic flow into the left MCA distribution. A follow-up biplane DSA now demonstrated progressively worsening flow through the left M1 distribution secondary to increasing filling defect within the supraclinoid left ICA, and in the proximal left MCA leading to complete occlusion of the left supraclinoid ICA and the proximal left M1 segment. A third pass was made again using a combination of the APRO 125 070 aspiration catheter advanced again into the proximal M1 segment in conjunction with an 021 microcatheter and the micro guidewire. The micro  guidewire and the microcatheter were removed. Aspiration was then applied for a minute and a half through the hub of the APRO intermediate catheter. This was then removed. A DSA through the support catheter now demonstrated improved flow through the supraclinoid left ICA and MCA distribution. Due to the aggressive platelet aggregation, it was decided to proceed with use of IV cangrelor  infusion in order to prevent further platelet aggregation. Left femoral venous access was then obtained due to the patient having only 1 IV at the start of the procedure. Attempts to obtain access via a vein were unsuccessful in both arms. DSAs performed at 10 and 20 minutes post commencement of the IV cangrelor  demonstrated gradual clearing of filling defects in the supraclinoid left ICA, the pipeline flow diverter device, and the in the left MCA distribution. A final control arteriogram performed through the Zoom support catheter in the left internal artery now demonstrated complete revascularization of the left MCA distribution and of the supraclinoid left ICA achieving a TICI 3 revascularization. The  left anterior cerebral artery remained widely patent. A flap panel CT obtained at the end of the procedure demonstrated no evidence of intracranial hemorrhage or mass effect. An 8 French Angio-Seal closure device was deployed for hemostasis at the right groin puncture site. The left 4 French sheath was left in position to continue the 4 hour IV cangrelor  infusion. Distal pulses remained present in both feet at the end of the procedure. The patient was left intubated due to her medical condition. She was then transferred to neuro ICU for post revascularization care. Medications: Aspirin  81 mg, and Brilinta  90 mg p.o. via a nasogastric tube after the first pass. IV half bolus dose of cangrelor , followed by a half dose infusion over 4 hours. CT scan of the brain to be obtained at the end of 4 hours. IMPRESSION: Status post  revascularization of the occluded distal left internal carotid artery supraclinoid segment, and the proximal left middle cerebral M1 segment with 3 passes of contact aspiration achieving a TICI 3 revascularization. PLAN: As per Stroke Neurology. Electronically Signed   By: Thyra Nash M.D.   On: 05/29/2024 08:05   IR CT Head Ltd Result Date: 05/29/2024 INDICATION: Acute onset of left gaze deviation, right-sided hemiplegia, right-sided neglect, and aphasia. 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 EXAM: 1. EMERGENT LARGE VESSEL OCCLUSION THROMBOLYSIS (anterior CIRCULATION) COMPARISON:  CT angiogram of the head and neck of 05/27/2024. MEDICATIONS: No antibiotic was administered within 1 hour of the procedure. ANESTHESIA/SEDATION: General anesthesia. CONTRAST:  Omnipaque  300 approximately 100 mL. FLUOROSCOPY TIME:  Fluoroscopy Time: 23 minutes 4 seconds (1502 mGy). COMPLICATIONS: None immediate. TECHNIQUE: An emergent 2 physician consent was obtained in view of the non availability of a patient's next of kin or relatives physically or from the telephone numbers listed in the patient's chart. The patient was then put under general anesthesia by the Department of Anesthesiology at [facility]. The right groin was prepped and draped in the usual sterile fashion. Thereafter using modified Seldinger technique, transfemoral access into the right common femoral artery was obtained without difficulty. Over an 0.035 inch guidewire an 8 French 25 cm Pinnacle sheath was inserted. Through this, and also over an 0.035 inch guidewire combination of a 125 cm 6 [place] Berenstein support catheter inside of an 088 100 cm Zoom support catheter was advanced to initially the common carotid artery and then to the distal left internal carotid artery at the cervical petrous junction. FINDINGS: The left common carotid arteriogram demonstrates patency of the  common carotid artery subclavian bypass supplying the left subclavian artery. The left common carotid artery bifurcation demonstrates the left external carotid artery and its major branches to be widely patent. 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 No distal opacification of the left middle cerebral artery or the left anterior cerebral artery is evident. PROCEDURE: Through the Zoom support catheter and the distal petrous segment, a combination of an 068 134 cm Penumbra aspiration catheter with a 160 cm 021 Trevo ProVue microcatheter was advanced over an 018 inch Aristotle micro guidewire with a moderate J configuration to the distal end of the 088 support catheter. The micro guidewire was then advanced through the occluded flow diverter device without difficulty into the M2 segment of the  inferior division followed by the microcatheter followed by the Zoom 068 aspiration catheter through the pipeline flow diverter device into the left mid M1 segment. The microcatheter and micro guidewire were removed as aspiration was then applied at the hub of the 068 aspiration catheter and at the hub of the 088 support catheter which had been advanced just proximal to the pipeline flow diverter device. The 068 catheter was removed. A control arteriogram performed through the 088 support catheter demonstrated revascularization of the left anterior and left middle cerebral artery distributions. A TICI 2C revascularization was obtained. A control arteriogram through the support catheter revealed a moderate-sized filling defect in the supraclinoid left ICA, and also along the lower edge of the pipeline flow diverter device. A second pass was then made using a 120 cm 070 APRO aspiration catheter which was advanced  in combination with an 021 microcatheter over an 018 inch Aristotle micro guidewire. Access was obtained with a microcatheter and micro guidewire in the M2 region of an inferior division branch. The 070 APRO catheter was advanced into the proximal left M1 segment. Aspiration was then applied at the hub of the APRO aspiration catheter, and also the Zoom support catheter following removal of the microcatheter. After a minute and a half of aspiration, the combination was retrieved and removed. A control arteriogram performed through the Zoom support catheter demonstrated moderate improvement in the previously noted filling defects in the supraclinoid left ICA, and also along the lower portion of the flow diverter device. Spasm of the left M1 segment was treated with 3 aliquots of 25 mcg of Nitroglycerin  with complete relief. Over the next few minutes, decrease in the filling defect was noted within the pipeline flow diverter device. Also noted was decreased hemodynamic flow into the left MCA distribution. A follow-up biplane DSA now demonstrated progressively worsening flow through the left M1 distribution secondary to increasing filling defect within the supraclinoid left ICA, and in the proximal left MCA leading to complete occlusion of the left supraclinoid ICA and the proximal left M1 segment. A third pass was made again using a combination of the APRO 125 070 aspiration catheter advanced again into the proximal M1 segment in conjunction with an 021 microcatheter and the micro guidewire. The micro guidewire and the microcatheter were removed. Aspiration was then applied for a minute and a half through the hub of the APRO intermediate catheter. This was then removed. A DSA through the support catheter now demonstrated improved flow through the supraclinoid left ICA and MCA distribution. Due to the aggressive platelet aggregation, it was decided to proceed with use of IV cangrelor  infusion in order to prevent further  platelet aggregation. Left femoral venous access was then obtained due to the patient having only 1 IV at the start of the procedure. Attempts to obtain access via a vein were unsuccessful in both arms. DSAs performed at 10 and 20 minutes post commencement of the IV cangrelor  demonstrated gradual clearing of filling defects in the supraclinoid left ICA, the pipeline flow diverter device, and the in the left MCA distribution. A final control arteriogram performed through the Zoom support catheter in the left internal artery now demonstrated complete revascularization of the left MCA distribution and of the supraclinoid left ICA achieving a TICI 3 revascularization. The left anterior cerebral artery remained widely patent. A flap panel CT obtained at the end of the procedure demonstrated no evidence of intracranial hemorrhage or mass effect. An 8 French Angio-Seal closure device was deployed for hemostasis  at the right groin puncture site. The left 4 French sheath was left in position to continue the 4 hour IV cangrelor  infusion. Distal pulses remained present in both feet at the end of the procedure. The patient was left intubated due to her medical condition. She was then transferred to neuro ICU for post revascularization care. Medications: Aspirin  81 mg, and Brilinta  90 mg p.o. via a nasogastric tube after the first pass. IV half bolus dose of cangrelor , followed by a half dose infusion over 4 hours. CT scan of the brain to be obtained at the end of 4 hours. IMPRESSION: Status post revascularization of the occluded distal left internal carotid artery supraclinoid segment, and the proximal left middle cerebral M1 segment with 3 passes of contact aspiration achieving a TICI 3 revascularization. PLAN: As per Stroke Neurology. Electronically Signed   By: Thyra Nash M.D.   On: 05/29/2024 08:05   MR BRAIN WO CONTRAST Result Date: 05/29/2024 EXAM: MRI BRAIN WITHOUT CONTRAST 05/28/2024 04:35:37 PM TECHNIQUE:  Multiplanar multisequence MRI of the head/brain was performed without the administration of intravenous contrast. COMPARISON: CT head without contrast 05/28/2024. MR head without contrast 05/19/2023. CLINICAL HISTORY: Stroke, follow up; 24 hrs post-TNK r/o hemorrhagic conversion. Revascularization of occluded left internal carotid artery 05/27/2024. FINDINGS: BRAIN AND VENTRICLES: A large acute nonhemorrhagic left MCA territory ischemic infarct is present. Acute nonhemorrhagic infarct is present in the posterior left lentiform nucleus. Acute nonhemorrhagic left periventricular acute white matter infarcts are present. No acute hemorrhage is present. Remote cortical infarcts are present in the left parietal lobe and along the watershed distribution over the left hemisphere. Remote inferior cerebellar infarcts are present, right greater than left. No mass. No midline shift. No hydrocephalus. The sella is unremarkable. Normal flow voids. ORBITS: No acute abnormality. SINUSES AND MASTOIDS: Fluid in the nasopharynx is likely secondary to intubation. Moderate mucosal disease is present in the sphenoid sinus bilaterally and posterior maxillary sinuses. Mild mucosal thickening is present in the anterior ethmoid air cells and inferior frontal sinuses. BONES AND SOFT TISSUES: Normal marrow signal. No acute soft tissue abnormality. IMPRESSION: 1. No acute hemorrhage. 2. Large acute nonhemorrhagic left MCA territory ischemic infarct. 3. Acute nonhemorrhagic infarct in the posterior left lentiform nucleus. 4. Acute nonhemorrhagic left periventricular white matter infarcts. 5. Remote cortical infarcts in the left parietal lobe and along the watershed distribution over the left hemisphere. Remote inferior cerebellar infarcts, right greater than left. 6. Moderate mucosal disease in the sphenoid sinus bilaterally and posterior maxillary sinuses. Mild mucosal thickening in the anterior ethmoid air cells and inferior frontal sinuses.  The pertinent results were texted to Dr. Vanessa via the [facility] system at 5:22am. Electronically signed by: Lonni Necessary MD 05/29/2024 05:23 AM EDT RP Workstation: [id number]   ECHOCARDIOGRAM COMPLETE BUBBLE STUDY Result Date: 05/28/2024    ECHOCARDIOGRAM REPORT   Patient Name:   NAYLIN BURKLE Date of Exam: 05/28/2024 Medical Rec #:  [id number]     Height:       67.0 in Accession #:    [id number]    Weight:       224.9 lb Date of Birth:  26-May-1966     BSA:          2.126 m Patient Age:    57 years      BP:           120/61 mmHg Patient Gender: F             HR:  86 bpm. Exam Location:  Inpatient Procedure: 2D Echo, Color Doppler and Cardiac Doppler (Both Spectral and Color            Flow Doppler were utilized during procedure). Indications:    Stroke  History:        Patient has prior history of Echocardiogram examinations, most                 recent 03/31/2023. Risk Factors:Hypertension.  Sonographer:    Benard Stallion Referring Phys: [id number] ELIDA HERO STACK IMPRESSIONS  1. Left ventricular ejection fraction, by estimation, is 65 to 70%. The left ventricle has hyperdynamic function. The left ventricle has no regional wall motion abnormalities. Left ventricular diastolic parameters are consistent with Grade I diastolic dysfunction (impaired relaxation).  2. Right ventricular systolic function is normal. The right ventricular size is normal. There is mildly elevated pulmonary artery systolic pressure. The estimated right ventricular systolic pressure is 37.1 mmHg.  3. The mitral valve is normal in structure. No evidence of mitral valve regurgitation. No evidence of mitral stenosis.  4. Increased aortic valve velocities are at least in part secondary to hyperdynamic state/increased cardiac output. The aortic valve is tricuspid. There is mild thickening of the aortic valve. Aortic valve regurgitation is not visualized. Aortic valve sclerosis is present, with no evidence of aortic valve stenosis.  5.  Agitated saline contrast bubble study was negative, with no evidence of any interatrial shunt. FINDINGS  Left Ventricle: Left ventricular ejection fraction, by estimation, is 65 to 70%. The left ventricle has hyperdynamic function. The left ventricle has no regional wall motion abnormalities. The left ventricular internal cavity size was small. There is no  left ventricular hypertrophy. Left ventricular diastolic parameters are consistent with Grade I diastolic dysfunction (impaired relaxation). Indeterminate filling pressures. Right Ventricle: The right ventricular size is normal. Right vetricular wall thickness was not well visualized. Right ventricular systolic function is normal. There is mildly elevated pulmonary artery systolic pressure. The tricuspid regurgitant velocity  is 2.92 m/s, and with an assumed right atrial pressure of 3 mmHg, the estimated right ventricular systolic pressure is 37.1 mmHg. Left Atrium: Left atrial size was normal in size. Right Atrium: Right atrial size was normal in size. Pericardium: There is no evidence of pericardial effusion. Mitral Valve: The mitral valve is normal in structure. No evidence of mitral valve regurgitation. No evidence of mitral valve stenosis. Tricuspid Valve: The tricuspid valve is normal in structure. Tricuspid valve regurgitation is trivial. Aortic Valve: Increased aortic valve velocities are at least in part secondary to hyperdynamic state/increased cardiac output. The aortic valve is tricuspid. There is mild thickening of the aortic valve. Aortic valve regurgitation is not visualized. Aortic valve sclerosis is present, with no evidence of aortic valve stenosis. Aortic valve mean gradient measures 15.7 mmHg. Aortic valve peak gradient measures 30.5 mmHg. Aortic valve area, by VTI measures 2.42 cm. Pulmonic Valve: The pulmonic valve was grossly normal. Pulmonic valve regurgitation is not visualized. No evidence of pulmonic stenosis. Aorta: The aortic root is  normal in size and structure. IAS/Shunts: No atrial level shunt detected by color flow Doppler. Agitated saline contrast bubble study was negative, with no evidence of any interatrial shunt.  LEFT VENTRICLE PLAX 2D LVIDd:         4.80 cm   Diastology LVIDs:         3.00 cm   LV e' medial:    7.29 cm/s LV PW:         0.90  cm   LV E/e' medial:  12.5 LV IVS:        1.10 cm   LV e' lateral:   9.90 cm/s LVOT diam:     2.20 cm   LV E/e' lateral: 9.2 LV SV:         113 LV SV Index:   53 LVOT Area:     3.80 cm  LEFT ATRIUM           Index LA diam:      3.80 cm 1.79 cm/m LA Vol (A4C): 45.4 ml 21.36 ml/m  AORTIC VALVE AV Area (Vmax):    2.36 cm AV Area (Vmean):   2.39 cm AV Area (VTI):     2.42 cm AV Vmax:           276.00 cm/s AV Vmean:          180.000 cm/s AV VTI:            0.469 m AV Peak Grad:      30.5 mmHg AV Mean Grad:      15.7 mmHg LVOT Vmax:         171.00 cm/s LVOT Vmean:        113.000 cm/s LVOT VTI:          0.298 m LVOT/AV VTI ratio: 0.64  AORTA Ao Root diam: 2.90 cm MITRAL VALVE                TRICUSPID VALVE MV Area (PHT): 3.31 cm     TR Peak grad:   34.1 mmHg MV Decel Time: 229 msec     TR Vmax:        292.00 cm/s MV E velocity: 91.30 cm/s MV A velocity: 122.00 cm/s  SHUNTS MV E/A ratio:  0.75         Systemic VTI:  0.30 m                             Systemic Diam: 2.20 cm Jerel Croitoru MD Electronically signed by Jerel Balding MD Signature Date/Time: 05/28/2024/1:10:16 PM    Final    IR PATIENT EVAL TECH 0-60 MINS Result Date: 05/28/2024 Royston Powell DEL     05/28/2024 10:23 AM Left 4 french venous femoral sheath removed at 1000. Manual pressure applied to obtain hemostasis at 1010. Dressing applied and site reviewed with [facility]. No acute complications. Distal pulses intact.   Portable Chest xray Result Date: 05/28/2024 CLINICAL DATA:  Status post ET tube placement. EXAM: PORTABLE CHEST 1 VIEW COMPARISON:  05/27/2024 FINDINGS: The endotracheal tube tip is 5.5 cm above the carina. Enteric tube  courses below the GE junction. Stable cardiomediastinal contours. Mild subsegmental atelectasis in the left midlung and medial right lung base. No significant pleural effusion, interstitial edema or airspace disease. No pneumothorax identified. Visualized osseous structures appear intact. IMPRESSION: 1. Satisfactory position of endotracheal tube. 2. Mild subsegmental atelectasis in the left midlung and medial right lung base. Electronically Signed   By: Waddell Calk M.D.   On: 05/28/2024 06:08   CT HEAD WO CONTRAST ( ) Result Date: 05/28/2024 EXAM: CT HEAD WITHOUT 05/28/2024 05:25:22 AM TECHNIQUE: CT of the head was performed without the administration of intravenous contrast. Automated exposure control, iterative reconstruction, and/or weight based adjustment of the mA/kV was utilized to reduce the radiation dose to as low as reasonably achievable. COMPARISON: CT head without contrast 05/27/2024. CT head without contrast 05/19/2023. CLINICAL HISTORY: Status  post revascularization of occluded left ICA. Stroke, follow up. FINDINGS: BRAIN AND VENTRICLES: Remote left occipital and posterior watershed infarcts are stable. Remote cerebellar infarcts are again noted, more prominent on the right. There is no acute intracranial hemorrhage, mass effect or midline shift. No abnormal extra-axial fluid collection. The gray-white differentiation is maintained without an acute infarct. There is no hydrocephalus. ORBITS: The visualized portion of the orbits demonstrate no acute abnormality. SINUSES: Mild mucosal thickening is present in the sphenoid sinuses bilaterally. No fluid levels are present. Mild mucosal thickening is present in the anterior ethmoid air cells and inferior frontal sinuses. The mastoid air cells are clear. SOFT TISSUES AND SKULL: The patient is intubated. OG tube is in place. No acute abnormality of the visualized skull or soft tissues. IMPRESSION: 1. Stable remote left occipital and posterior watershed  infarcts. 2. Stable remote cerebellar infarcts, more prominent on the right. Electronically signed by: Lonni Necessary MD 05/28/2024 05:47 AM EDT RP Workstation: [id number]   CT ANGIO HEAD NECK W WO CM Result Date: 05/28/2024 EXAM: CTA HEAD AND NECK WITH AND WITHOUT 05/28/2024 05:25:22 AM TECHNIQUE: CTA of the head and neck was performed with and without the administration of intravenous contrast. Multiplanar 2D and/or 3D reformatted images are provided for review. Automated exposure control, iterative reconstruction, and/or weight based adjustment of the mA/kV was utilized to reduce the radiation dose to as low as reasonably achievable. Stenosis of the internal carotid arteries measured using NASCET criteria. COMPARISON: CT head without contrast 05/28/2024 at 05:19 am and CT angio head and neck 05/27/2024 CLINICAL HISTORY: Neuro deficit, acute, stroke suspected. Status post revascularization of occluded flow diverter stent in the left internal carotid artery. Revascularization of left M1 segment. FINDINGS: CTA NECK: AORTIC ARCH AND ARCH VESSELS: Mild atherosclerotic calcifications are again noted at the aortic arch. The great vessel origins are widely patent. CAROTID ARTERIES: Atherosclerotic changes are present at the right carotid bifurcation without significant stenosis. Mild mural plaque is present in the left common carotid artery and bifurcation without significant stenosis or change. The left internal carotid artery is revascularized. VERTEBRAL ARTERIES: The proximal left vertebral artery is occluded. The vessel is reconstituted in the distal left v2 segment. High-grade stenosis and occlusion of the right vertebral artery at the v3 segment is noted. The left vertebral artery is occluded just below the dural margin, stable. The distal vertebral arteries are reconstituted. SOFT TISSUES: The lung apices are clear. No cervical or superior mediastinal lymphadenopathy. The larynx and pharynx are  unremarkable. No acute abnormality of the salivary and thyroid  glands. BONES: No acute osseous abnormality. CTA HEAD: ANTERIOR CIRCULATION: Atherosclerotic calcifications are again noted in the supraclinoid right ICA without significant stenosis through the ICA terminus. The left cavernous ICA stent is patent. The A1 and M1 segments are patent. Mild proximal left M1 segment narrowing is present. The ACA and MCA branch vessels opacify normally on both sides. POSTERIOR CIRCULATION: The basilar artery is small. Prominent iliac vessels are again noted. The superior cerebellar arteries are patent bilaterally, with posterior cerebral arteries originating from the basilar tip. Moderate attenuation of distal PCA branch vessels is present bilaterally, worse on the right. OTHER: No dural venous sinus thrombosis on this non-dedicated study. BRAIN: There is no acute infarct or acute intracranial hemorrhage. No mass effect or midline shift. IMPRESSION: 1. Status post revascularization of occluded flow diverter stent in the left internal carotid artery and revascularization of left M1 segment. Mild proximal left M1 segment narrowing. 2. High-grade stenosis and occlusion  of the right vertebral artery at the V3 segment, stable. 3. Mild mural plaque in the left common carotid artery and bifurcation without significant stenosis or change. 4. Atherosclerotic changes at the right carotid bifurcation without significant stenosis. 5. Occlusion of the left vertebral artery just below the dural margin, stable. 6. Moderate attenuation of distal PCA branch vessels bilaterally, worse on the right. Electronically signed by: Lonni Necessary MD 05/28/2024 05:44 AM EDT RP Workstation: [id number]   CT HEAD POST STROKE FOLLOWUP/TIMED/STAT READ Result Date: 05/27/2024 CLINICAL DATA:  Code stroke.  Neuro deficit, acute, stroke suspected EXAM: CT HEAD WITHOUT CONTRAST TECHNIQUE: Contiguous axial images were obtained from the base of the skull  through the vertex without intravenous contrast. RADIATION DOSE REDUCTION: This exam was performed according to the departmental dose-optimization program which includes automated exposure control, adjustment of the mA and/or kV according to patient size and/or use of iterative reconstruction technique. COMPARISON:  CT head May 27, 2024. FINDINGS: Brain: Similar remote infarcts in the left cerebral hemisphere and cerebellum. No evidence of acute large vascular territory infarct, acute hemorrhage, mass lesion, midline shift or hydrocephalus. Vascular: Calcific atherosclerosis. Skull: No acute fracture. Sinuses/Orbits: Paranasal sinus mucosal thickening. No acute orbital findings. ASPECTS [facility] Stroke Program Early CT Score) Total score (0-10 with 10 being normal): 10. IMPRESSION: 1. No evidence of acute intracranial abnormality.  ASPECTS is 10. 2. Similar remote infarcts. Electronically Signed   By: Gilmore GORMAN Molt M.D.   On: 05/27/2024 23:04   DG Abd 1 View Result Date: 05/27/2024 CLINICAL DATA:  OG tube placement EXAM: ABDOMEN - 1 VIEW COMPARISON:  01/20/2019 FINDINGS: Limited field of view for tube placement verification purposes. An enteric tube is present with tip projecting over the right upper quadrant consistent location of the distal stomach or proximal duodenum. Visualized bowel gas pattern is normal. Mild perihilar infiltration is suggested in the lungs. Surgical clip in the right upper quadrant. Residual contrast material in the urinary tract. IMPRESSION: Enteric tube tip projects over the right upper quadrant consistent with location in the distal stomach or proximal duodenum. Electronically Signed   By: Elsie Gravely M.D.   On: 05/27/2024 20:24   Portable Chest x-ray Result Date: 05/27/2024 CLINICAL DATA:  Endotracheal tube.  OG tube placement. EXAM: PORTABLE CHEST 1 VIEW COMPARISON:  CT chest 09/02/2022 FINDINGS: Endotracheal tube is present with tip measuring 4.2 cm above the carina.  Enteric tube is present. Tip is off the field of view but below the left hemidiaphragm. Shallow inspiration. Cardiac enlargement. Perihilar infiltrates, greater on the left. This may represent edema or pneumonia. No pleural effusion or pneumothorax. Patient is rotated towards the right. IMPRESSION: 1. Appliances appear in satisfactory position. 2. Cardiac enlargement. 3. Perihilar infiltrates, greater on the left, possibly pneumonia or edema. Electronically Signed   By: Elsie Gravely M.D.   On: 05/27/2024 20:23   CT ANGIO HEAD NECK W WO CM (CODE STROKE) Result Date: 05/27/2024 CLINICAL DATA:  Neuro deficit, concern for stroke, right-sided weakness. EXAM: CT ANGIOGRAPHY HEAD AND NECK WITH AND WITHOUT CONTRAST TECHNIQUE: Multidetector CT imaging of the head and neck was performed using the standard protocol during bolus administration of intravenous contrast. Multiplanar CT image reconstructions and MIPs were obtained to evaluate the vascular anatomy. Carotid stenosis measurements (when applicable) are obtained utilizing NASCET criteria, using the distal internal carotid diameter as the denominator. RADIATION DOSE REDUCTION: This exam was performed according to the departmental dose-optimization program which includes automated exposure control, adjustment of the mA and/or kV  according to patient size and/or use of iterative reconstruction technique. CONTRAST:  75mL OMNIPAQUE  IOHEXOL  350 MG/ML SOLN COMPARISON:  Same day CT head.  CTA head and neck 03/31/2023. FINDINGS: CTA NECK FINDINGS Aortic arch: Standard configuration of the aortic arch. Imaged portion shows no evidence of aneurysm or dissection. Atherosclerosis of the visualized aortic arch involving the origins of the aortic arch vessels. There is prominent noncalcified atherosclerotic plaque involving the proximal left subclavian artery. Additional noncalcified atherosclerotic plaque involving the brachiocephalic artery origin resulting in mild stenosis.  Pulmonary arteries: As permitted by contrast timing, there are no filling defects in the visualized pulmonary arteries. Subclavian arteries: Similar focal occlusion of the proximal left subclavian artery with reconstitution of the distal left subclavian. Right subclavian arteries patent. Right carotid system: Patent from the origin to the skull base. Atherosclerosis at the carotid bifurcation without stenosis greater than 50%. No evidence of dissection. Left carotid system: Patent from the origin to the carotid bifurcation. Multifocal atherosclerosis of the common carotid artery. Redemonstrated left common carotid to left subclavian artery bypass which appears patent. There is noncalcified atherosclerotic plaque at the carotid bifurcation. The external carotid artery branches are patent. There is abrupt occlusion of the proximal cervical ICA. Vertebral arteries: The right vertebral artery is patent from the origin to the V3 segment which is significantly diminished in caliber similar to prior. There is severely diminished caliber and intraluminal contrast within the intracranial vertebral artery. The left vertebral artery demonstrates intermittent contrast opacification primarily within the V2 segment. The left vertebral artery origins not well visualized. The left V3 segment is patent with poor contrast opacification of the left V4 segment similar to prior. Skeleton: No acute or aggressive finding noted. Other neck: The visualized airway is patent. No cervical lymphadenopathy. Upper chest: Visualized lung apices are clear. Review of the MIP images confirms the above findings CTA HEAD FINDINGS ANTERIOR CIRCULATION: The left internal carotid artery is occluded from the cervical segment to the distal supraclinoid ICA. There is reconstitution at the ICA terminus likely via the circle-of-Willis. The right internal carotid artery is patent to the ICA terminus. Atherosclerosis of the right carotid siphon with moderate  stenosis of the supraclinoid ICA. MCAs: Patent bilaterally. Mild narrowing at the origin of the left M1 segment similar to prior. Moderate stenosis of a proximal M2 inferior division branch of the left MCA. ACAs: Patent bilaterally. Moderate narrowing of the left A2 segment. POSTERIOR CIRCULATION: Small caliber basilar artery similar to prior without evidence of focal occlusion. There is poor contrast opacification of both PCAs similar to prior. The SCA is are visualized proximally. Venous sinuses: As permitted by contrast timing, patent. Anatomic variants: None Review of the MIP images confirms the above findings IMPRESSION: Occlusion of the left internal carotid artery from the proximal cervical segment to the ICA terminus likely secondary to noncalcified atherosclerotic plaque near the carotid bifurcation. Reconstitution of the ICA terminus via the circle-of-Willis. Multifocal atherosclerosis in the neck. Redemonstrated multifocal occlusion of the left vertebral artery in the neck. Poor contrast opacification of the V3 and V4 segments bilaterally. Similar occlusion of the proximal left subclavian artery with patent left common carotid to left subclavian bypass noted. Intracranial atherosclerotic disease as above. Moderate stenosis of the right supraclinoid ICA. Focal moderate stenosis of a proximal M2 branch of the left MCA. Moderate stenosis of the A2 segment left ACA. These results were called by telephone at the time of interpretation on 05/27/2024 at 4:15 pm to provider Dr. Garrick, who verbally acknowledged  these results. Electronically Signed   By: Donnice Mania M.D.   On: 05/27/2024 16:16   CT HEAD CODE STROKE WO CONTRAST Result Date: 05/27/2024 CLINICAL DATA:  Code stroke. Neuro deficit, concern for stroke, right-sided weakness. EXAM: CT HEAD WITHOUT CONTRAST TECHNIQUE: Contiguous axial images were obtained from the base of the skull through the vertex without intravenous contrast. RADIATION DOSE  REDUCTION: This exam was performed according to the departmental dose-optimization program which includes automated exposure control, adjustment of the mA and/or kV according to patient size and/or use of iterative reconstruction technique. COMPARISON:  CT head and CTA head and neck 03/31/2023. FINDINGS: Brain: No acute intracranial hemorrhage. Similar appearance of encephalomalacia throughout the right cerebral hemisphere particularly in the frontoparietal lobes and superior left occipital lobe. Additional remote infarcts in the bilateral cerebellum. Nonspecific hypoattenuation in the periventricular and subcortical white matter favored to reflect chronic microvascular ischemic changes. No edema, mass effect, or midline shift. The basilar cisterns are patent. Ventricles: Slight ex vacuo dilatation of the right lateral ventricle. No hydrocephalus. Vascular: Atherosclerosis in the right carotid siphon. Redemonstrated stent in the left carotid siphon. Skull: No acute or aggressive finding. Orbits: Orbits are symmetric. Sinuses: Mucosal thickening in the ethmoid sinuses. Other: Mastoid air cells are clear. ASPECTS [facility] Stroke Program Early CT Score) - Ganglionic level infarction (caudate, lentiform nuclei, internal capsule, insula, M1-M3 cortex): 7 - Supraganglionic infarction (M4-M6 cortex): 3 Total score (0-10 with 10 being normal): 10 IMPRESSION: 1. No CT evidence of acute intracranial abnormality. 2. Similar remote infarcts in the left cerebral hemisphere and bilateral cerebellum. Chronic microvascular ischemic changes. 3. ASPECTS is 10 These results were called by telephone at the time of interpretation on 05/27/2024 at 3:22 pm to provider LAMAR SALEN , who verbally acknowledged these results. Electronically Signed   By: Donnice Mania M.D.   On: 05/27/2024 15:22     Subjective: Pt says she is not going back to [facility] and will go home with her sister who agreed to care for her 24/7 and she is  agreeable to home health.    Discharge Exam: Vitals:   06/16/24 0733 06/16/24 0936  BP:  125/75  Pulse:    Resp:  16  Temp:  97.7 F (36.5 C)  SpO2: 100% 99%   Vitals:   06/16/24 0358 06/16/24 0730 06/16/24 0733 06/16/24 0936  BP: (!) 140/88   125/75  Pulse: 80     Resp: 16   16  Temp: 98.8 F (37.1 C)   97.7 F (36.5 C)  TempSrc: Oral   Oral  SpO2: 98% 100% 100% 99%  Weight:      Height:       General exam: Appears acutely and chronically ill poor dentition; calm and comfortable, dysphonia  MM moist.  Respiratory system: Clear to auscultation. Respiratory effort normal. Cardiovascular system: normal S1 & S2 heard. No JVD, murmurs, rubs, gallops or clicks. No pedal edema. Gastrointestinal system: Abdomen is nondistended, soft and nontender. No organomegaly or masses felt. Normal bowel sounds heard. Central nervous system: Alert and oriented. Facial droop on right, dysphonia, 4/5 RUE, 5/5 BLEs ambulating with assist.  Extremities: no cyanosis. Skin: No rashes, lesions or ulcers. Psychiatry: Judgement and insight appear normal. Mood & affect appropriate.    The results of significant diagnostics from this hospitalization (including imaging, microbiology, ancillary and laboratory) are listed below for reference.     Microbiology: Recent Results (from the past 240 hours)  Blood culture (routine x 2)  Status: None (Preliminary result)   Collection Time: 06/13/24  2:24 AM   Specimen: BLOOD  Result Value Ref Range Status   Specimen Description BLOOD BLOOD LEFT ARM  Final   Special Requests   Final    BOTTLES DRAWN AEROBIC AND ANAEROBIC Blood Culture results may not be optimal due to an inadequate volume of blood received in culture bottles   Culture   Final    NO GROWTH 3 DAYS Performed at [facility], [address] [place], [place] [address]    Report Status PENDING  Incomplete  Blood culture (routine x 2)     Status: None (Preliminary result)   Collection Time:  06/13/24  2:36 AM   Specimen: BLOOD  Result Value Ref Range Status   Specimen Description BLOOD BLOOD LEFT HAND  Final   Special Requests   Final    BOTTLES DRAWN AEROBIC AND ANAEROBIC Blood Culture adequate volume   Culture   Final    NO GROWTH 3 DAYS Performed at [facility], [address], [place], [place] [address]    Report Status PENDING  Incomplete  Urine Culture     Status: Abnormal   Collection Time: 06/13/24  2:47 AM   Specimen: Urine, Clean Catch  Result Value Ref Range Status   Specimen Description   Final    URINE, CLEAN CATCH Performed at [facility], [address], [place], [place] [address]    Special Requests   Final    NONE Performed at [facility], [address] [place], [place] [address]    Culture >=100,000 COLONIES/mL KLEBSIELLA PNEUMONIAE (A)  Final   Report Status 06/16/2024 FINAL  Final   Organism ID, Bacteria KLEBSIELLA PNEUMONIAE (A)  Final      Susceptibility   Klebsiella pneumoniae - MIC*    AMPICILLIN  >=32 RESISTANT Resistant     CEFAZOLIN  <=4 SENSITIVE Sensitive     CEFEPIME <=0.12 SENSITIVE Sensitive     CEFTRIAXONE  <=0.25 SENSITIVE Sensitive     CIPROFLOXACIN <=0.25 SENSITIVE Sensitive     GENTAMICIN <=1 SENSITIVE Sensitive     IMIPENEM <=0.25 SENSITIVE Sensitive     NITROFURANTOIN 64 INTERMEDIATE Intermediate     TRIMETH/SULFA <=20 SENSITIVE Sensitive     AMPICILLIN /SULBACTAM 4 SENSITIVE Sensitive     PIP/TAZO <=4 SENSITIVE Sensitive ug/mL    * >=100,000 COLONIES/mL KLEBSIELLA PNEUMONIAE  MRSA Next Gen by PCR, Nasal     Status: None   Collection Time: 06/13/24  8:06 AM   Specimen: Nasal Mucosa; Nasal Swab  Result Value Ref Range Status   MRSA by PCR Next Gen NOT DETECTED NOT DETECTED Final    Comment: (NOTE) The GeneXpert MRSA Assay (FDA approved for NASAL specimens only), is one component of a comprehensive MRSA colonization surveillance program. It is not intended to diagnose MRSA infection nor to guide or monitor treatment for  MRSA infections. Test performance is not FDA approved in patients less than 45 years old. Performed at [facility], [address], [place], [place] [address]      Labs: BNP (last 3 results) No results for input(s): BNP in the last 8760 hours. Basic Metabolic Panel: Recent Labs  Lab 06/13/24 0653 06/13/24 1141 06/14/24 0411 06/15/24 0258 06/16/24 0408  NA 137 137 142 142 142  K 3.2* 3.2* 2.8* 3.3* 3.1*  CL 110 105 99 105 106  CO2 16* 19* 30 28 27   GLUCOSE 142* 198* 160* 119* 113*  BUN 90* 94* 65* 31* 19  CREATININE 1.96* 1.75*  1.22* 0.89 0.85  CALCIUM  7.8* 8.0* 7.7* 7.8* 7.9*  MG 1.0*  --  1.9  --  1.7  PHOS <1.0*  --  3.0  --   --    Liver Function Tests: Recent Labs  Lab 06/13/24 0236  AST 31  ALT 39  ALKPHOS 78  BILITOT 0.6  PROT 6.3*  ALBUMIN 3.1*   Recent Labs  Lab 06/13/24 0236  LIPASE 40   No results for input(s): AMMONIA in the last 168 hours. CBC: Recent Labs  Lab 06/13/24 0236 06/14/24 0411 06/14/24 1137 06/14/24 1959 06/15/24 0258 06/15/24 1042 06/16/24 0408  WBC 35.0* 18.2*  --   --  14.4*  --  12.7*  NEUTROABS 31.9*  --   --   --   --   --  7.9*  HGB 13.1 7.2* 8.1* 6.5* 7.3* 8.3* 7.9*  HCT 39.3 20.9* 23.8* 19.0* 21.1* 24.7* 23.6*  MCV 88.9 89.3  --   --  88.3  --  90.8  PLT 944* 470*  --   --  375  --  439*   Cardiac Enzymes: No results for input(s): CKTOTAL, CKMB, CKMBINDEX, TROPONINI in the last 168 hours. BNP: Invalid input(s): POCBNP CBG: Recent Labs  Lab 06/15/24 1617 06/15/24 2054 06/16/24 0633 06/16/24 0659 06/16/24 1104  GLUCAP 107* 125* 107* 120* 122*   D-Dimer No results for input(s): DDIMER in the last 72 hours. Hgb A1c No results for input(s): HGBA1C in the last 72 hours. Lipid Profile No results for input(s): CHOL, HDL, LDLCALC, TRIG, CHOLHDL, LDLDIRECT in the last 72 hours. Thyroid  function studies No results for input(s): TSH, T4TOTAL, T3FREE, THYROIDAB in the last 72  hours.  Invalid input(s): FREET3 Anemia work up No results for input(s): VITAMINB12, FOLATE, FERRITIN, TIBC, IRON, RETICCTPCT in the last 72 hours. Urinalysis    Component Value Date/Time   COLORURINE YELLOW 06/13/2024 0247   APPEARANCEUR CLOUDY (A) 06/13/2024 0247   LABSPEC 1.015 06/13/2024 0247   PHURINE 5.0 06/13/2024 0247   GLUCOSEU 50 (A) 06/13/2024 0247   HGBUR NEGATIVE 06/13/2024 0247   BILIRUBINUR NEGATIVE 06/13/2024 0247   KETONESUR NEGATIVE 06/13/2024 0247   PROTEINUR 100 (A) 06/13/2024 0247   UROBILINOGEN 0.2 02/05/2015 2347   NITRITE NEGATIVE 06/13/2024 0247   LEUKOCYTESUR MODERATE (A) 06/13/2024 0247   Sepsis Labs Recent Labs  Lab 06/13/24 0236 06/14/24 0411 06/15/24 0258 06/16/24 0408  WBC 35.0* 18.2* 14.4* 12.7*   Microbiology Recent Results (from the past 240 hours)  Blood culture (routine x 2)     Status: None (Preliminary result)   Collection Time: 06/13/24  2:24 AM   Specimen: BLOOD  Result Value Ref Range Status   Specimen Description BLOOD BLOOD LEFT ARM  Final   Special Requests   Final    BOTTLES DRAWN AEROBIC AND ANAEROBIC Blood Culture results may not be optimal due to an inadequate volume of blood received in culture bottles   Culture   Final    NO GROWTH 3 DAYS Performed at [facility], [address] [place], [place] [address]    Report Status PENDING  Incomplete  Blood culture (routine x 2)     Status: None (Preliminary result)   Collection Time: 06/13/24  2:36 AM   Specimen: BLOOD  Result Value Ref Range Status   Specimen Description BLOOD BLOOD LEFT HAND  Final   Special Requests   Final    BOTTLES DRAWN AEROBIC AND ANAEROBIC Blood Culture adequate volume   Culture   Final  NO GROWTH 3 DAYS Performed at [facility], [address], [place], [place] [address]    Report Status PENDING  Incomplete  Urine Culture     Status: Abnormal   Collection Time: 06/13/24  2:47 AM   Specimen: Urine, Clean Catch  Result Value  Ref Range Status   Specimen Description   Final    URINE, CLEAN CATCH Performed at [facility], [address], [place], [place] [address]    Special Requests   Final    NONE Performed at [facility], [address] [place], [place] [address]    Culture >=100,000 COLONIES/mL KLEBSIELLA PNEUMONIAE (A)  Final   Report Status 06/16/2024 FINAL  Final   Organism ID, Bacteria KLEBSIELLA PNEUMONIAE (A)  Final      Susceptibility   Klebsiella pneumoniae - MIC*    AMPICILLIN  >=32 RESISTANT Resistant     CEFAZOLIN  <=4 SENSITIVE Sensitive     CEFEPIME <=0.12 SENSITIVE Sensitive     CEFTRIAXONE  <=0.25 SENSITIVE Sensitive     CIPROFLOXACIN <=0.25 SENSITIVE Sensitive     GENTAMICIN <=1 SENSITIVE Sensitive     IMIPENEM <=0.25 SENSITIVE Sensitive     NITROFURANTOIN 64 INTERMEDIATE Intermediate     TRIMETH/SULFA <=20 SENSITIVE Sensitive     AMPICILLIN /SULBACTAM 4 SENSITIVE Sensitive     PIP/TAZO <=4 SENSITIVE Sensitive ug/mL    * >=100,000 COLONIES/mL KLEBSIELLA PNEUMONIAE  MRSA Next Gen by PCR, Nasal     Status: None   Collection Time: 06/13/24  8:06 AM   Specimen: Nasal Mucosa; Nasal Swab  Result Value Ref Range Status   MRSA by PCR Next Gen NOT DETECTED NOT DETECTED Final    Comment: (NOTE) The GeneXpert MRSA Assay (FDA approved for NASAL specimens only), is one component of a comprehensive MRSA colonization surveillance program. It is not intended to diagnose MRSA infection nor to guide or monitor treatment for MRSA infections. Test performance is not FDA approved in patients less than 23 years old. Performed at [facility], [address] [place], [place] [address]    Time coordinating discharge:  47 mins  SIGNED:  Afton Louder, MD  [facility] 06/16/2024, 11:54 AM How to contact the [facility] Attending or Consulting provider 7A - 7P or covering provider during after hours 7P -7A, for this patient?  Check the care team in [facility] and look for a) attending/consulting [facility] provider  listed and b) the TRH team listed Log into [email or web address] and use [place]'s universal password to access. If you do not have the password, please contact the hospital operator. Locate the TR[facility] provider you are looking for under [facility] and page to a number that you can be directly reached. If you still have difficulty reaching the provider, please page the [facility] (Director on Call) for the Hospitalists listed on amion for assistance.

## 2024-06-16 NOTE — Progress Notes (Incomplete)
 Gastroenterology & Hepatology   Interval History: ***  Inpatient Medications:  Current Facility-Administered Medications:    amoxicillin -clavulanate (AUGMENTIN ) 875-125 MG per tablet 1 tablet, 1 tablet, Oral, Q12H, Johnson, Clanford L, MD, 1 tablet at 06/16/24 0947   arformoterol  (BROVANA ) nebulizer solution 15 mcg, 15 mcg, Nebulization, BID, Johnson, Clanford L, MD, 15 mcg at 06/16/24 9266   aspirin  EC tablet 81 mg, 81 mg, Oral, Daily, Johnson, Clanford L, MD, 81 mg at 06/16/24 9060   atorvastatin  (LIPITOR ) tablet 80 mg, 80 mg, Oral, QHS, Segars, Dorn, MD, 80 mg at 06/15/24 2145   budesonide  (PULMICORT ) nebulizer solution 0.5 mg, 0.5 mg, Nebulization, BID, Johnson, Clanford L, MD, 0.5 mg at 06/16/24 0730   buprenorphine -naloxone  (SUBOXONE ) 8-2 mg per SL tablet 1 tablet, 1 tablet, Sublingual, TID, Segars, Jonathan, MD, 1 tablet at 06/16/24 9060   Chlorhexidine  Gluconate Cloth 2 % PADS 6 each, 6 each, Topical, Q0600, Vicci, Clanford L, MD, 6 each at 06/15/24 0558   dextrose  50 % solution 0-50 mL, 0-50 mL, Intravenous, PRN, Mesner, Selinda, MD   ezetimibe  (ZETIA ) tablet 10 mg, 10 mg, Oral, Daily, Segars, Dorn, MD, 10 mg at 06/16/24 0939   feeding supplement (ENSURE PLUS HIGH PROTEIN) liquid 237 mL, 237 mL, Oral, BID BM, Johnson, Clanford L, MD, 237 mL at 06/16/24 0940   insulin  aspart (novoLOG ) injection 0-9 Units, 0-9 Units, Subcutaneous, TID WC, Johnson, Clanford L, MD   ipratropium-albuterol  (DUONEB) 0.5-2.5 (3) MG/3ML nebulizer solution 3 mL, 3 mL, Nebulization, Q6H PRN, Segars, Jonathan, MD, 3 mL at 06/14/24 2001   magnesium  sulfate IVPB 4 g 100 mL, 4 g, Intravenous, Once, Johnson, Clanford L, MD, Last Rate: 50 mL/hr at 06/16/24 0952, 4 g at 06/16/24 9047   midodrine  (PROAMATINE ) tablet 5 mg, 5 mg, Oral, TID WC, Johnson, Clanford L, MD, 5 mg at 06/16/24 9065   nicotine  (NICODERM CQ  - dosed in mg/24 hours) patch 14 mg, 14 mg, Transdermal, Daily, Segars, Dorn, MD, 14 mg at 06/16/24  9060   pantoprazole  (PROTONIX ) injection 40 mg, 40 mg, Intravenous, Q12H, Segars, Dorn, MD, 40 mg at 06/16/24 9061   sodium chloride  flush (NS) 0.9 % injection 3 mL, 3 mL, Intravenous, Q12H, Segars, Dorn, MD, 3 mL at 06/16/24 0939   ticagrelor  (BRILINTA ) tablet 90 mg, 90 mg, Oral, BID, Johnson, Clanford L, MD, 90 mg at 06/16/24 9060   I/O    Intake/Output Summary (Last 24 hours) at 06/16/2024 1142 Last data filed at 06/16/2024 0502 Gross per 24 hour  Intake 1983.02 ml  Output 500 ml  Net 1483.02 ml     Physical Exam: Temp:  [97.7 F (36.5 C)-99.4 F (37.4 C)] 97.7 F (36.5 C) (06/22 0936) Pulse Rate:  [69-88] 80 (06/22 0358) Resp:  [16-20] 16 (06/22 0936) BP: (106-140)/(51-88) 125/75 (06/22 0936) SpO2:  [95 %-100 %] 99 % (06/22 0936)  Temp (24hrs), Avg:98.8 F (37.1 C), Min:97.7 F (36.5 C), Max:99.4 F (37.4 C)  GENERAL: The patient is AO x3, in no acute distress. HEENT: Head is normocephalic and atraumatic. EOMI are intact. Mouth is well hydrated and without lesions. NECK: Supple. No masses LUNGS: Clear to auscultation. No presence of rhonchi/wheezing/rales. Adequate chest expansion HEART: RRR, normal s1 and s2. ABDOMEN: Soft, nontender, no guarding, no peritoneal signs, and nondistended. BS +. No masses. EXTREMITIES: Without any cyanosis, clubbing, rash, lesions or edema. NEUROLOGIC: AOx3, no focal motor deficit. SKIN: no jaundice, no rashes  Laboratory Data: CBC:     Component Value Date/Time   WBC 12.7 (  H) 06/16/2024 0408   RBC 2.60 (L) 06/16/2024 0408   HGB 7.9 (L) 06/16/2024 0408   HCT 23.6 (L) 06/16/2024 0408   PLT 439 (H) 06/16/2024 0408   MCV 90.8 06/16/2024 0408   MCH 30.4 06/16/2024 0408   MCHC 33.5 06/16/2024 0408   RDW 15.8 (H) 06/16/2024 0408   LYMPHSABS 3.6 06/16/2024 0408   MONOABS 0.7 06/16/2024 0408   EOSABS 0.3 06/16/2024 0408   BASOSABS 0.0 06/16/2024 0408   COAG:  Lab Results  Component Value Date   INR 0.9 05/27/2024   INR 0.9  03/31/2023   INR 0.9 09/08/2022    BMP:     Latest Ref Rng & Units 06/16/2024    4:08 AM 06/15/2024    2:58 AM 06/14/2024    4:11 AM  BMP  Glucose 70 - 99 mg/dL 886  880  839   BUN 6 - 20 mg/dL 19  31  65   Creatinine 0.44 - 1.00 mg/dL 9.14  9.10  8.77   Sodium 135 - 145 mmol/L 142  142  142   Potassium 3.5 - 5.1 mmol/L 3.1  3.3  2.8   Chloride 98 - 111 mmol/L 106  105  99   CO2 22 - 32 mmol/L 27  28  30    Calcium  8.9 - 10.3 mg/dL 7.9  7.8  7.7     HEPATIC:     Latest Ref Rng & Units 06/13/2024    2:36 AM 05/27/2024    3:08 PM 03/31/2023    6:40 AM  Hepatic Function  Total Protein 6.5 - 8.1 g/dL 6.3  7.4  7.1   Albumin 3.5 - 5.0 g/dL 3.1  4.0  3.7   AST 15 - 41 U/L 31  21  20    ALT 0 - 44 U/L 39  20  24   Alk Phosphatase 38 - 126 U/L 78  73  80   Total Bilirubin 0.0 - 1.2 mg/dL 0.6  0.2  0.4     CARDIAC:  Lab Results  Component Value Date   CKTOTAL 41 06/23/2010   CKMB 1.0 06/23/2010   TROPONINI <0.03 02/22/2018      Imaging: I personally reviewed and interpreted the available labs, imaging and endoscopic files.   Assessment/Plan:  Rita [name] Rita Kalia Vahey, MD Gastroenterology and Hepatology [facility]   This chart has been completed using [facility] Dictation software, and while attempts have been made to ensure accuracy , certain words and phrases may not be transcribed as intended

## 2024-06-16 NOTE — TOC Transition Note (Signed)
 Transition of Care [facility]) - Discharge Note   Patient Details  Name: Rita [name] MRN: [id number] Date of Birth: 01/01/1966  Transition of Care [facility]) CM/SW Contact:  Nena LITTIE Coffee, RN Phone Number: 06/16/2024, 11:31 AM   Clinical Narrative:    Have been unable to contact pt's daughter, Rita [name]. Several messages have been left over the weekend. Pt's sister, Rita states that pt does not want to return to [facility] and pt can discharge to sister's home. Pt has indicated agreement. Pt contacts updated with sister's information. Home health services have been ordered, however HH agencies in our area are not presently accepting [facility] insurance due to quotas. This has been explained to pt's sister, Rita. Instructed Mickey to follow-up regarding HH at pt's PCP appt as services post discharge can be arranged from there. HH referrals sent to [place], [place], [facility], [place], [place], [place], [place] and [address]. Mickey verbalized understanding.   Final next level of care: Other (comment) (Home with sister, Rita [name].) Barriers to Discharge: Barriers Resolved  Patient Goals and CMS Choice Patient states their goals for this hospitalization and ongoing recovery are:: To return to her home CMS Medicare.gov Compare Post Acute Care list provided to:: Patient Represenative (must comment) Choice offered to / list presented to : Adult Children    Discharge Placement                  Name of family member notified: Rita [name], sister, and brother-in-law. Patient and family notified of of transfer: 06/16/24  Discharge Plan and Services Additional resources added to the After Visit Summary for       Post Acute Care Choice: Skilled Nursing Facility                           Social Drivers of Health (SDOH) Interventions SDOH Screenings   Food Insecurity: Patient Unable To Answer (06/13/2024)  Housing: Unknown (06/13/2024)  Transportation Needs: Patient Unable To Answer  (06/13/2024)  Utilities: Patient Unable To Answer (06/13/2024)  Social Connections: Patient Unable To Answer (06/13/2024)  Tobacco Use: High Risk (06/13/2024)     Readmission Risk Interventions    06/13/2024   12:39 PM  Readmission Risk Prevention Plan  Transportation Screening Complete  PCP or Specialist Appt within 3-5 Days Not Complete  HRI or Home Care Consult Complete  Social Work Consult for Recovery Care Planning/Counseling Complete  Palliative Care Screening Not Applicable  Medication Review Oceanographer) Complete

## 2024-06-18 LAB — HAPTOGLOBIN: Haptoglobin: 280 mg/dL (ref 33–346)

## 2024-06-18 LAB — CULTURE, BLOOD (ROUTINE X 2)
Culture: NO GROWTH
Culture: NO GROWTH
Special Requests: ADEQUATE

## 2024-06-21 ENCOUNTER — Inpatient Hospital Stay (HOSPITAL_COMMUNITY)

## 2024-06-21 ENCOUNTER — Inpatient Hospital Stay (HOSPITAL_COMMUNITY)
Admission: EM | Admit: 2024-06-21 | Discharge: 2024-07-26 | DRG: 870 | Disposition: E | Attending: Internal Medicine | Admitting: Internal Medicine

## 2024-06-21 ENCOUNTER — Emergency Department (HOSPITAL_COMMUNITY)

## 2024-06-21 ENCOUNTER — Encounter (HOSPITAL_COMMUNITY): Payer: Self-pay | Admitting: Interventional Radiology

## 2024-06-21 ENCOUNTER — Encounter (HOSPITAL_COMMUNITY): Payer: Self-pay | Admitting: Emergency Medicine

## 2024-06-21 ENCOUNTER — Other Ambulatory Visit: Payer: Self-pay

## 2024-06-21 DIAGNOSIS — R6520 Severe sepsis without septic shock: Secondary | ICD-10-CM | POA: Diagnosis not present

## 2024-06-21 DIAGNOSIS — J90 Pleural effusion, not elsewhere classified: Secondary | ICD-10-CM | POA: Diagnosis present

## 2024-06-21 DIAGNOSIS — R918 Other nonspecific abnormal finding of lung field: Secondary | ICD-10-CM | POA: Diagnosis present

## 2024-06-21 DIAGNOSIS — G40909 Epilepsy, unspecified, not intractable, without status epilepticus: Secondary | ICD-10-CM | POA: Diagnosis present

## 2024-06-21 DIAGNOSIS — E7800 Pure hypercholesterolemia, unspecified: Secondary | ICD-10-CM | POA: Diagnosis present

## 2024-06-21 DIAGNOSIS — F17210 Nicotine dependence, cigarettes, uncomplicated: Secondary | ICD-10-CM | POA: Diagnosis present

## 2024-06-21 DIAGNOSIS — I6602 Occlusion and stenosis of left middle cerebral artery: Secondary | ICD-10-CM | POA: Diagnosis present

## 2024-06-21 DIAGNOSIS — G4089 Other seizures: Secondary | ICD-10-CM | POA: Diagnosis not present

## 2024-06-21 DIAGNOSIS — E1151 Type 2 diabetes mellitus with diabetic peripheral angiopathy without gangrene: Secondary | ICD-10-CM | POA: Diagnosis present

## 2024-06-21 DIAGNOSIS — K2101 Gastro-esophageal reflux disease with esophagitis, with bleeding: Secondary | ICD-10-CM | POA: Diagnosis present

## 2024-06-21 DIAGNOSIS — F1120 Opioid dependence, uncomplicated: Secondary | ICD-10-CM | POA: Diagnosis present

## 2024-06-21 DIAGNOSIS — G931 Anoxic brain damage, not elsewhere classified: Secondary | ICD-10-CM | POA: Diagnosis present

## 2024-06-21 DIAGNOSIS — Z833 Family history of diabetes mellitus: Secondary | ICD-10-CM

## 2024-06-21 DIAGNOSIS — S270XXA Traumatic pneumothorax, initial encounter: Secondary | ICD-10-CM | POA: Diagnosis present

## 2024-06-21 DIAGNOSIS — Z7902 Long term (current) use of antithrombotics/antiplatelets: Secondary | ICD-10-CM

## 2024-06-21 DIAGNOSIS — J440 Chronic obstructive pulmonary disease with acute lower respiratory infection: Secondary | ICD-10-CM | POA: Diagnosis present

## 2024-06-21 DIAGNOSIS — R1319 Other dysphagia: Secondary | ICD-10-CM | POA: Diagnosis present

## 2024-06-21 DIAGNOSIS — I69320 Aphasia following cerebral infarction: Secondary | ICD-10-CM

## 2024-06-21 DIAGNOSIS — E669 Obesity, unspecified: Secondary | ICD-10-CM | POA: Diagnosis present

## 2024-06-21 DIAGNOSIS — G40901 Epilepsy, unspecified, not intractable, with status epilepticus: Secondary | ICD-10-CM | POA: Diagnosis not present

## 2024-06-21 DIAGNOSIS — E871 Hypo-osmolality and hyponatremia: Secondary | ICD-10-CM | POA: Diagnosis present

## 2024-06-21 DIAGNOSIS — M96A3 Multiple fractures of ribs associated with chest compression and cardiopulmonary resuscitation: Secondary | ICD-10-CM | POA: Diagnosis present

## 2024-06-21 DIAGNOSIS — I6522 Occlusion and stenosis of left carotid artery: Secondary | ICD-10-CM | POA: Diagnosis present

## 2024-06-21 DIAGNOSIS — E781 Pure hyperglyceridemia: Secondary | ICD-10-CM | POA: Diagnosis not present

## 2024-06-21 DIAGNOSIS — Z7982 Long term (current) use of aspirin: Secondary | ICD-10-CM

## 2024-06-21 DIAGNOSIS — D62 Acute posthemorrhagic anemia: Secondary | ICD-10-CM | POA: Diagnosis not present

## 2024-06-21 DIAGNOSIS — E860 Dehydration: Secondary | ICD-10-CM | POA: Diagnosis present

## 2024-06-21 DIAGNOSIS — Z8711 Personal history of peptic ulcer disease: Secondary | ICD-10-CM

## 2024-06-21 DIAGNOSIS — I69351 Hemiplegia and hemiparesis following cerebral infarction affecting right dominant side: Secondary | ICD-10-CM

## 2024-06-21 DIAGNOSIS — J189 Pneumonia, unspecified organism: Secondary | ICD-10-CM | POA: Diagnosis present

## 2024-06-21 DIAGNOSIS — R4182 Altered mental status, unspecified: Principal | ICD-10-CM

## 2024-06-21 DIAGNOSIS — N179 Acute kidney failure, unspecified: Secondary | ICD-10-CM | POA: Diagnosis not present

## 2024-06-21 DIAGNOSIS — R569 Unspecified convulsions: Secondary | ICD-10-CM | POA: Diagnosis not present

## 2024-06-21 DIAGNOSIS — J9602 Acute respiratory failure with hypercapnia: Secondary | ICD-10-CM | POA: Diagnosis present

## 2024-06-21 DIAGNOSIS — I469 Cardiac arrest, cause unspecified: Secondary | ICD-10-CM | POA: Diagnosis present

## 2024-06-21 DIAGNOSIS — K922 Gastrointestinal hemorrhage, unspecified: Secondary | ICD-10-CM | POA: Diagnosis not present

## 2024-06-21 DIAGNOSIS — R6521 Severe sepsis with septic shock: Secondary | ICD-10-CM | POA: Diagnosis present

## 2024-06-21 DIAGNOSIS — K529 Noninfective gastroenteritis and colitis, unspecified: Secondary | ICD-10-CM | POA: Diagnosis present

## 2024-06-21 DIAGNOSIS — N170 Acute kidney failure with tubular necrosis: Secondary | ICD-10-CM | POA: Diagnosis present

## 2024-06-21 DIAGNOSIS — Z7984 Long term (current) use of oral hypoglycemic drugs: Secondary | ICD-10-CM

## 2024-06-21 DIAGNOSIS — Z66 Do not resuscitate: Secondary | ICD-10-CM | POA: Diagnosis not present

## 2024-06-21 DIAGNOSIS — G9340 Encephalopathy, unspecified: Secondary | ICD-10-CM | POA: Diagnosis present

## 2024-06-21 DIAGNOSIS — Z720 Tobacco use: Secondary | ICD-10-CM | POA: Diagnosis present

## 2024-06-21 DIAGNOSIS — J939 Pneumothorax, unspecified: Secondary | ICD-10-CM | POA: Diagnosis not present

## 2024-06-21 DIAGNOSIS — Z6834 Body mass index (BMI) 34.0-34.9, adult: Secondary | ICD-10-CM

## 2024-06-21 DIAGNOSIS — K2270 Barrett's esophagus without dysplasia: Secondary | ICD-10-CM | POA: Diagnosis present

## 2024-06-21 DIAGNOSIS — K560 Paralytic ileus: Secondary | ICD-10-CM | POA: Diagnosis present

## 2024-06-21 DIAGNOSIS — I959 Hypotension, unspecified: Secondary | ICD-10-CM

## 2024-06-21 DIAGNOSIS — Y848 Other medical procedures as the cause of abnormal reaction of the patient, or of later complication, without mention of misadventure at the time of the procedure: Secondary | ICD-10-CM | POA: Diagnosis present

## 2024-06-21 DIAGNOSIS — E876 Hypokalemia: Secondary | ICD-10-CM | POA: Diagnosis present

## 2024-06-21 DIAGNOSIS — G9389 Other specified disorders of brain: Secondary | ICD-10-CM | POA: Diagnosis present

## 2024-06-21 DIAGNOSIS — R680 Hypothermia, not associated with low environmental temperature: Secondary | ICD-10-CM | POA: Diagnosis present

## 2024-06-21 DIAGNOSIS — Z79899 Other long term (current) drug therapy: Secondary | ICD-10-CM

## 2024-06-21 DIAGNOSIS — G9349 Other encephalopathy: Secondary | ICD-10-CM | POA: Diagnosis not present

## 2024-06-21 DIAGNOSIS — E8729 Other acidosis: Secondary | ICD-10-CM | POA: Diagnosis not present

## 2024-06-21 DIAGNOSIS — K209 Barrett's esophagus without dysplasia: Secondary | ICD-10-CM | POA: Diagnosis present

## 2024-06-21 DIAGNOSIS — K7200 Acute and subacute hepatic failure without coma: Secondary | ICD-10-CM | POA: Diagnosis present

## 2024-06-21 DIAGNOSIS — J9601 Acute respiratory failure with hypoxia: Secondary | ICD-10-CM | POA: Diagnosis present

## 2024-06-21 DIAGNOSIS — T41295A Adverse effect of other general anesthetics, initial encounter: Secondary | ICD-10-CM | POA: Diagnosis not present

## 2024-06-21 DIAGNOSIS — Z8249 Family history of ischemic heart disease and other diseases of the circulatory system: Secondary | ICD-10-CM

## 2024-06-21 DIAGNOSIS — D649 Anemia, unspecified: Secondary | ICD-10-CM | POA: Diagnosis present

## 2024-06-21 DIAGNOSIS — I4901 Ventricular fibrillation: Secondary | ICD-10-CM | POA: Diagnosis present

## 2024-06-21 DIAGNOSIS — I10 Essential (primary) hypertension: Secondary | ICD-10-CM | POA: Diagnosis present

## 2024-06-21 DIAGNOSIS — Z823 Family history of stroke: Secondary | ICD-10-CM

## 2024-06-21 DIAGNOSIS — Z515 Encounter for palliative care: Secondary | ICD-10-CM

## 2024-06-21 DIAGNOSIS — A419 Sepsis, unspecified organism: Secondary | ICD-10-CM | POA: Diagnosis present

## 2024-06-21 DIAGNOSIS — E872 Acidosis, unspecified: Secondary | ICD-10-CM | POA: Diagnosis present

## 2024-06-21 DIAGNOSIS — K219 Gastro-esophageal reflux disease without esophagitis: Secondary | ICD-10-CM | POA: Diagnosis present

## 2024-06-21 DIAGNOSIS — I4510 Unspecified right bundle-branch block: Secondary | ICD-10-CM | POA: Diagnosis present

## 2024-06-21 DIAGNOSIS — Z7951 Long term (current) use of inhaled steroids: Secondary | ICD-10-CM

## 2024-06-21 DIAGNOSIS — R1314 Dysphagia, pharyngoesophageal phase: Secondary | ICD-10-CM | POA: Diagnosis present

## 2024-06-21 DIAGNOSIS — E8721 Acute metabolic acidosis: Secondary | ICD-10-CM | POA: Diagnosis present

## 2024-06-21 DIAGNOSIS — I69392 Facial weakness following cerebral infarction: Secondary | ICD-10-CM | POA: Diagnosis not present

## 2024-06-21 LAB — COMPREHENSIVE METABOLIC PANEL WITH GFR
ALT: 30 U/L (ref 0–44)
ALT: 55 U/L — ABNORMAL HIGH (ref 0–44)
ALT: 122 U/L — ABNORMAL HIGH (ref 0–44)
AST: 30 U/L (ref 15–41)
AST: 51 U/L — ABNORMAL HIGH (ref 15–41)
AST: 130 U/L — ABNORMAL HIGH (ref 15–41)
Albumin: 3 g/dL — ABNORMAL LOW (ref 3.5–5.0)
Albumin: 1.8 g/dL — ABNORMAL LOW (ref 3.5–5.0)
Albumin: 2 g/dL — ABNORMAL LOW (ref 3.5–5.0)
Alkaline Phosphatase: 60 U/L (ref 38–126)
Alkaline Phosphatase: 45 U/L (ref 38–126)
Alkaline Phosphatase: 36 U/L — ABNORMAL LOW (ref 38–126)
Anion gap: 26 — ABNORMAL HIGH (ref 5–15)
Anion gap: 21 — ABNORMAL HIGH (ref 5–15)
Anion gap: 14 (ref 5–15)
BUN: 68 mg/dL — ABNORMAL HIGH (ref 6–20)
BUN: 78 mg/dL — ABNORMAL HIGH (ref 6–20)
BUN: 76 mg/dL — ABNORMAL HIGH (ref 6–20)
CO2: 18 mmol/L — ABNORMAL LOW (ref 22–32)
CO2: 14 mmol/L — ABNORMAL LOW (ref 22–32)
CO2: 18 mmol/L — ABNORMAL LOW (ref 22–32)
Calcium: 8.8 mg/dL — ABNORMAL LOW (ref 8.9–10.3)
Calcium: 7.3 mg/dL — ABNORMAL LOW (ref 8.9–10.3)
Calcium: 6.4 mg/dL — CL (ref 8.9–10.3)
Chloride: 92 mmol/L — ABNORMAL LOW (ref 98–111)
Chloride: 105 mmol/L (ref 98–111)
Chloride: 102 mmol/L (ref 98–111)
Creatinine, Ser: 2.11 mg/dL — ABNORMAL HIGH (ref 0.44–1.00)
Creatinine, Ser: 2.26 mg/dL — ABNORMAL HIGH (ref 0.44–1.00)
Creatinine, Ser: 2.59 mg/dL — ABNORMAL HIGH (ref 0.44–1.00)
GFR, Estimated: 27 mL/min — ABNORMAL LOW (ref >60)
GFR, Estimated: 25 mL/min — ABNORMAL LOW (ref >60)
GFR, Estimated: 21 mL/min — ABNORMAL LOW (ref >60)
Glucose, Bld: 379 mg/dL — ABNORMAL HIGH (ref 70–99)
Glucose, Bld: 258 mg/dL — ABNORMAL HIGH (ref 70–99)
Glucose, Bld: 249 mg/dL — ABNORMAL HIGH (ref 70–99)
Potassium: 3.2 mmol/L — ABNORMAL LOW (ref 3.5–5.1)
Potassium: 4.8 mmol/L (ref 3.5–5.1)
Potassium: 4.7 mmol/L (ref 3.5–5.1)
Sodium: 136 mmol/L (ref 135–145)
Sodium: 140 mmol/L (ref 135–145)
Sodium: 134 mmol/L — ABNORMAL LOW (ref 135–145)
Total Bilirubin: 0.5 mg/dL (ref 0.0–1.2)
Total Bilirubin: 0.5 mg/dL (ref 0.0–1.2)
Total Bilirubin: 0.5 mg/dL (ref 0.0–1.2)
Total Protein: 6.3 g/dL — ABNORMAL LOW (ref 6.5–8.1)
Total Protein: 3.9 g/dL — ABNORMAL LOW (ref 6.5–8.1)
Total Protein: 4 g/dL — ABNORMAL LOW (ref 6.5–8.1)

## 2024-06-21 LAB — CBC WITH DIFFERENTIAL/PLATELET
Abs Immature Granulocytes: 1.62 10*3/uL — ABNORMAL HIGH (ref 0.00–0.07)
Basophils Absolute: 0.1 10*3/uL (ref 0.0–0.1)
Basophils Relative: 0 %
Eosinophils Absolute: 0 10*3/uL (ref 0.0–0.5)
Eosinophils Relative: 0 %
HCT: 36.1 % (ref 36.0–46.0)
Hemoglobin: 11.8 g/dL — ABNORMAL LOW (ref 12.0–15.0)
Immature Granulocytes: 5 %
Lymphocytes Relative: 4 %
Lymphs Abs: 1.4 10*3/uL (ref 0.7–4.0)
MCH: 29.6 pg (ref 26.0–34.0)
MCHC: 32.7 g/dL (ref 30.0–36.0)
MCV: 90.5 fL (ref 80.0–100.0)
Monocytes Absolute: 1.9 10*3/uL — ABNORMAL HIGH (ref 0.1–1.0)
Monocytes Relative: 6 %
Neutro Abs: 29.8 10*3/uL — ABNORMAL HIGH (ref 1.7–7.7)
Neutrophils Relative %: 85 %
Platelets: 911 10*3/uL (ref 150–400)
RBC: 3.99 MIL/uL (ref 3.87–5.11)
RDW: 14.9 % (ref 11.5–15.5)
WBC: 34.8 10*3/uL — ABNORMAL HIGH (ref 4.0–10.5)
nRBC: 0.1 % (ref 0.0–0.2)

## 2024-06-21 LAB — URINALYSIS, W/ REFLEX TO CULTURE (INFECTION SUSPECTED)
Bacteria, UA: NONE SEEN
Bilirubin Urine: NEGATIVE
Glucose, UA: NEGATIVE mg/dL
Hgb urine dipstick: NEGATIVE
Ketones, ur: NEGATIVE mg/dL
Leukocytes,Ua: NEGATIVE
Nitrite: NEGATIVE
Protein, ur: 30 mg/dL — AB
Specific Gravity, Urine: 1.017 (ref 1.005–1.030)
pH: 5 (ref 5.0–8.0)

## 2024-06-21 LAB — GLUCOSE, CAPILLARY
Glucose-Capillary: 127 mg/dL — ABNORMAL HIGH (ref 70–99)
Glucose-Capillary: 102 mg/dL — ABNORMAL HIGH (ref 70–99)
Glucose-Capillary: 137 mg/dL — ABNORMAL HIGH (ref 70–99)
Glucose-Capillary: 148 mg/dL — ABNORMAL HIGH (ref 70–99)

## 2024-06-21 LAB — CBC
HCT: 22.3 % — ABNORMAL LOW (ref 36.0–46.0)
HCT: 31.7 % — ABNORMAL LOW (ref 36.0–46.0)
Hemoglobin: 6.9 g/dL — CL (ref 12.0–15.0)
Hemoglobin: 10.9 g/dL — ABNORMAL LOW (ref 12.0–15.0)
MCH: 29.9 pg (ref 26.0–34.0)
MCH: 31 pg (ref 26.0–34.0)
MCHC: 30.9 g/dL (ref 30.0–36.0)
MCHC: 34.4 g/dL (ref 30.0–36.0)
MCV: 96.5 fL (ref 80.0–100.0)
MCV: 90.1 fL (ref 80.0–100.0)
Platelets: 548 10*3/uL — ABNORMAL HIGH (ref 150–400)
Platelets: 455 10*3/uL — ABNORMAL HIGH (ref 150–400)
RBC: 2.31 MIL/uL — ABNORMAL LOW (ref 3.87–5.11)
RBC: 3.52 MIL/uL — ABNORMAL LOW (ref 3.87–5.11)
RDW: 14.8 % (ref 11.5–15.5)
RDW: 15.5 % (ref 11.5–15.5)
WBC: 27.6 10*3/uL — ABNORMAL HIGH (ref 4.0–10.5)
WBC: 33.1 10*3/uL — ABNORMAL HIGH (ref 4.0–10.5)
nRBC: 0.7 % — ABNORMAL HIGH (ref 0.0–0.2)
nRBC: 0.2 % (ref 0.0–0.2)

## 2024-06-21 LAB — BLOOD GAS, ARTERIAL
Acid-base deficit: 19.3 mmol/L — ABNORMAL HIGH (ref 0.0–2.0)
Bicarbonate: 11.8 mmol/L — ABNORMAL LOW (ref 20.0–28.0)
Drawn by: 23430
O2 Saturation: 89.4 %
Patient temperature: 36.5
pCO2 arterial: 47 mmHg (ref 32–48)
pH, Arterial: 7.01 — CL (ref 7.35–7.45)
pO2, Arterial: 66 mmHg — ABNORMAL LOW (ref 83–108)

## 2024-06-21 LAB — BLOOD GAS, VENOUS
Acid-base deficit: 8.6 mmol/L — ABNORMAL HIGH (ref 0.0–2.0)
Bicarbonate: 13.9 mmol/L — ABNORMAL LOW (ref 20.0–28.0)
Drawn by: 442
O2 Saturation: 44.4 %
Patient temperature: 37.3
pCO2, Ven: 22 mmHg — ABNORMAL LOW (ref 44–60)
pH, Ven: 7.41 (ref 7.25–7.43)
pO2, Ven: 32 mmHg (ref 32–45)

## 2024-06-21 LAB — APTT: aPTT: 27 s (ref 24–36)

## 2024-06-21 LAB — RESP PANEL BY RT-PCR (RSV, FLU A&B, COVID)  RVPGX2
Influenza A by PCR: NEGATIVE
Influenza B by PCR: NEGATIVE
Resp Syncytial Virus by PCR: NEGATIVE
SARS Coronavirus 2 by RT PCR: NEGATIVE

## 2024-06-21 LAB — BETA-HYDROXYBUTYRIC ACID: Beta-Hydroxybutyric Acid: 0.31 mmol/L — ABNORMAL HIGH (ref 0.05–0.27)

## 2024-06-21 LAB — MRSA NEXT GEN BY PCR, NASAL
MRSA by PCR Next Gen: NOT DETECTED
MRSA by PCR Next Gen: NOT DETECTED

## 2024-06-21 LAB — PROTIME-INR
INR: 1 (ref 0.8–1.2)
INR: 1.4 — ABNORMAL HIGH (ref 0.8–1.2)
Prothrombin Time: 14 s (ref 11.4–15.2)
Prothrombin Time: 18.3 s — ABNORMAL HIGH (ref 11.4–15.2)

## 2024-06-21 LAB — MAGNESIUM: Magnesium: 1.2 mg/dL — ABNORMAL LOW (ref 1.7–2.4)

## 2024-06-21 LAB — CBG MONITORING, ED: Glucose-Capillary: 323 mg/dL — ABNORMAL HIGH (ref 70–99)

## 2024-06-21 LAB — TROPONIN I (HIGH SENSITIVITY)
Troponin I (High Sensitivity): 41 ng/L — ABNORMAL HIGH (ref <18)
Troponin I (High Sensitivity): 39 ng/L — ABNORMAL HIGH (ref <18)

## 2024-06-21 LAB — ETHANOL: Alcohol, Ethyl (B): <15 mg/dL (ref <15)

## 2024-06-21 LAB — C DIFFICILE QUICK SCREEN W PCR REFLEX
C Diff antigen: NEGATIVE
C Diff interpretation: NOT DETECTED
C Diff toxin: NEGATIVE

## 2024-06-21 LAB — CG4 I-STAT (LACTIC ACID): Lactic Acid, Venous: 1.8 mmol/L (ref 0.5–1.9)

## 2024-06-21 LAB — PHOSPHORUS: Phosphorus: 6.5 mg/dL — ABNORMAL HIGH (ref 2.5–4.6)

## 2024-06-21 LAB — LACTIC ACID, PLASMA
Lactic Acid, Venous: 8.8 mmol/L (ref 0.5–1.9)
Lactic Acid, Venous: >9 mmol/L (ref 0.5–1.9)
Lactic Acid, Venous: >9 mmol/L (ref 0.5–1.9)

## 2024-06-21 LAB — AMMONIA: Ammonia: <13 umol/L (ref 9–35)

## 2024-06-21 LAB — SALICYLATE LEVEL: Salicylate Lvl: <7 mg/dL — ABNORMAL LOW (ref 7.0–30.0)

## 2024-06-21 LAB — POC OCCULT BLOOD, ED: Fecal Occult Bld: POSITIVE — AB

## 2024-06-21 LAB — PREPARE RBC (CROSSMATCH)

## 2024-06-21 LAB — FIBRINOGEN: Fibrinogen: 326 mg/dL (ref 210–475)

## 2024-06-21 LAB — OCCULT BLOOD X 1 CARD TO LAB, STOOL: Fecal Occult Bld: POSITIVE — AB

## 2024-06-21 MED ORDER — ATORVASTATIN CALCIUM 40 MG PO TABS: 80.0000 mg | ORAL_TABLET | Freq: Every day | ORAL | Status: DC

## 2024-06-21 MED ORDER — LIDOCAINE HCL (PF) 2 % IJ SOLN
INTRAMUSCULAR | Status: AC
  Filled 2024-06-21: qty 5

## 2024-06-21 MED ORDER — ACETAMINOPHEN 650 MG RE SUPP: 650.0000 mg | Freq: Four times a day (QID) | RECTAL | Status: DC | PRN

## 2024-06-21 MED ORDER — POLYETHYLENE GLYCOL 3350 17 G PO PACK: 17.0000 g | PACK | Freq: Every day | ORAL | Status: DC | PRN

## 2024-06-21 MED ORDER — LACTATED RINGERS IV BOLUS (SEPSIS)
1000.0000 mL | Freq: Once | INTRAVENOUS | Status: AC
  Administered 2024-06-21: 1000 mL via INTRAVENOUS

## 2024-06-21 MED ORDER — PANTOPRAZOLE SODIUM 40 MG IV SOLR: 40.0000 mg | INTRAVENOUS | Status: DC

## 2024-06-21 MED ORDER — POTASSIUM CHLORIDE 10 MEQ/100ML IV SOLN
10.0000 meq | Freq: Once | INTRAVENOUS | Status: AC
  Administered 2024-06-21: 10 meq via INTRAVENOUS
  Filled 2024-06-21: qty 100

## 2024-06-21 MED ORDER — ONDANSETRON HCL 4 MG PO TABS: 4.0000 mg | ORAL_TABLET | Freq: Four times a day (QID) | ORAL | Status: DC | PRN

## 2024-06-21 MED ORDER — SODIUM CHLORIDE 0.45 % IV SOLN
INTRAVENOUS | Status: DC
  Filled 2024-06-21 (×4): qty 75

## 2024-06-21 MED ORDER — SODIUM CHLORIDE 0.9 % IV SOLN: INTRAVENOUS | Status: DC

## 2024-06-21 MED ORDER — HEPARIN SODIUM (PORCINE) 5000 UNIT/ML IJ SOLN
5000.0000 [IU] | Freq: Three times a day (TID) | INTRAMUSCULAR | Status: DC
  Filled 2024-06-21: qty 1

## 2024-06-21 MED ORDER — LIDOCAINE HCL (PF) 1 % IJ SOLN
INTRAMUSCULAR | Status: DC
Start: 2024-06-21 — End: 2024-06-21
  Filled 2024-06-21: qty 5

## 2024-06-21 MED ORDER — PANTOPRAZOLE SODIUM 40 MG IV SOLR
40.0000 mg | Freq: Once | INTRAVENOUS | Status: AC
  Administered 2024-06-21: 40 mg via INTRAVENOUS
  Filled 2024-06-21: qty 10

## 2024-06-21 MED ORDER — ACETAMINOPHEN 325 MG PO TABS: 650.0000 mg | ORAL_TABLET | Freq: Four times a day (QID) | ORAL | Status: DC | PRN

## 2024-06-21 MED ORDER — LIDOCAINE HCL (PF) 1 % IJ SOLN
INTRAMUSCULAR | Status: AC
  Filled 2024-06-21: qty 5

## 2024-06-21 MED ORDER — NOREPINEPHRINE 4 MG/250ML-% IV SOLN
0.0000 ug/min | INTRAVENOUS | Status: DC
  Administered 2024-06-21: 5 ug/min via INTRAVENOUS
  Filled 2024-06-21: qty 250

## 2024-06-21 MED ORDER — PROPOFOL 1000 MG/100ML IV EMUL
0.0000 ug/kg/min | INTRAVENOUS | Status: DC
  Administered 2024-06-21: 5 ug/kg/min via INTRAVENOUS
  Administered 2024-06-22 (×2): 30 ug/kg/min via INTRAVENOUS
  Administered 2024-06-22: 60 ug/kg/min via INTRAVENOUS
  Administered 2024-06-22 (×2): 80 ug/kg/min via INTRAVENOUS
  Administered 2024-06-22: 50 ug/kg/min via INTRAVENOUS
  Administered 2024-06-22: 90 ug/kg/min via INTRAVENOUS
  Administered 2024-06-22: 60 ug/kg/min via INTRAVENOUS
  Administered 2024-06-23: 50 ug/kg/min via INTRAVENOUS
  Administered 2024-06-23: 70 ug/kg/min via INTRAVENOUS
  Administered 2024-06-23: 30 ug/kg/min via INTRAVENOUS
  Filled 2024-06-21 (×7): qty 100
  Filled 2024-06-21: qty 500
  Filled 2024-06-21 (×3): qty 100

## 2024-06-21 MED ORDER — NOREPINEPHRINE 4 MG/250ML-% IV SOLN
0.0000 ug/min | INTRAVENOUS | Status: DC
  Administered 2024-06-21: 10 ug/min via INTRAVENOUS
  Administered 2024-06-21: 15 ug/min via INTRAVENOUS
  Administered 2024-06-22: 11 ug/min via INTRAVENOUS
  Administered 2024-06-22: 25 ug/min via INTRAVENOUS
  Administered 2024-06-22: 14 ug/min via INTRAVENOUS
  Administered 2024-06-23: 25 ug/min via INTRAVENOUS
  Administered 2024-06-23: 35 ug/min via INTRAVENOUS
  Administered 2024-06-23: 30 ug/min via INTRAVENOUS
  Administered 2024-06-23: 38 ug/min via INTRAVENOUS
  Filled 2024-06-21 (×3): qty 250
  Filled 2024-06-21: qty 500
  Filled 2024-06-21 (×3): qty 250

## 2024-06-21 MED ORDER — SODIUM CHLORIDE 0.9 % IV SOLN
250.0000 mL | INTRAVENOUS | Status: DC
  Administered 2024-06-21: 250 mL via INTRAVENOUS

## 2024-06-21 MED ORDER — SODIUM BICARBONATE 8.4 % IV SOLN
50.0000 meq | Freq: Once | INTRAVENOUS | Status: AC
  Administered 2024-06-21: 50 meq via INTRAVENOUS

## 2024-06-21 MED ORDER — VANCOMYCIN HCL 2000 MG/400ML IV SOLN
2000.0000 mg | Freq: Once | INTRAVENOUS | Status: AC
  Administered 2024-06-21: 2000 mg via INTRAVENOUS
  Filled 2024-06-21: qty 400

## 2024-06-21 MED ORDER — ENSURE PLUS HIGH PROTEIN PO LIQD
237.0000 mL | Freq: Three times a day (TID) | ORAL | Status: DC
  Administered 2024-06-22: 237 mL via ORAL

## 2024-06-21 MED ORDER — SODIUM CHLORIDE 0.9 % IV SOLN: INTRAVENOUS | Status: AC | PRN

## 2024-06-21 MED ORDER — FENTANYL CITRATE PF 50 MCG/ML IJ SOSY
50.0000 ug | PREFILLED_SYRINGE | INTRAMUSCULAR | Status: AC | PRN
  Administered 2024-06-21 – 2024-06-22 (×3): 50 ug via INTRAVENOUS
  Filled 2024-06-21 (×2): qty 1

## 2024-06-21 MED ORDER — POTASSIUM CHLORIDE 10 MEQ/100ML IV SOLN
10.0000 meq | INTRAVENOUS | Status: AC
  Administered 2024-06-21: 10 meq via INTRAVENOUS
  Filled 2024-06-21: qty 100

## 2024-06-21 MED ORDER — SODIUM CHLORIDE 0.9% IV SOLUTION: Freq: Once | INTRAVENOUS | Status: AC

## 2024-06-21 MED ORDER — METRONIDAZOLE 500 MG/100ML IV SOLN
500.0000 mg | Freq: Once | INTRAVENOUS | Status: AC
  Administered 2024-06-21: 500 mg via INTRAVENOUS
  Filled 2024-06-21: qty 100

## 2024-06-21 MED ORDER — SODIUM CHLORIDE 0.9% IV SOLUTION: Freq: Once | INTRAVENOUS | Status: DC

## 2024-06-21 MED ORDER — VANCOMYCIN HCL IN DEXTROSE 1-5 GM/200ML-% IV SOLN: 1000.0000 mg | Freq: Once | INTRAVENOUS | Status: DC

## 2024-06-21 MED ORDER — SODIUM CHLORIDE 0.9 % IV SOLN
2.0000 g | INTRAVENOUS | Status: DC
  Administered 2024-06-21: 2 g via INTRAVENOUS
  Filled 2024-06-21: qty 20

## 2024-06-21 MED ORDER — ALBUTEROL SULFATE (2.5 MG/3ML) 0.083% IN NEBU: 2.5000 mg | INHALATION_SOLUTION | RESPIRATORY_TRACT | Status: DC | PRN

## 2024-06-21 MED ORDER — SODIUM CHLORIDE 0.9% FLUSH
3.0000 mL | Freq: Two times a day (BID) | INTRAVENOUS | Status: DC
  Administered 2024-06-21 – 2024-06-26 (×10): 3 mL via INTRAVENOUS

## 2024-06-21 MED ORDER — CHLORHEXIDINE GLUCONATE CLOTH 2 % EX PADS
6.0000 | MEDICATED_PAD | Freq: Every day | CUTANEOUS | Status: DC
  Administered 2024-06-21 – 2024-06-26 (×6): 6 via TOPICAL

## 2024-06-21 MED ORDER — SODIUM CHLORIDE 0.9 % IV SOLN
500.0000 mg | INTRAVENOUS | Status: DC
  Administered 2024-06-21 – 2024-06-22 (×2): 500 mg via INTRAVENOUS
  Filled 2024-06-21 (×2): qty 5

## 2024-06-21 MED ORDER — METFORMIN HCL ER 500 MG PO TB24: 500.0000 mg | ORAL_TABLET | Freq: Every day | ORAL | Status: DC

## 2024-06-21 MED ORDER — ATORVASTATIN CALCIUM 80 MG PO TABS
80.0000 mg | ORAL_TABLET | Freq: Every day | ORAL | Status: DC
  Administered 2024-06-22 – 2024-06-26 (×5): 80 mg
  Filled 2024-06-21 (×5): qty 1

## 2024-06-21 MED ORDER — FENTANYL CITRATE PF 50 MCG/ML IJ SOSY
50.0000 ug | PREFILLED_SYRINGE | INTRAMUSCULAR | Status: DC | PRN
  Administered 2024-06-21 – 2024-06-22 (×4): 50 ug via INTRAVENOUS
  Administered 2024-06-22 (×2): 100 ug via INTRAVENOUS
  Filled 2024-06-21: qty 1

## 2024-06-21 MED ORDER — PANTOPRAZOLE SODIUM 40 MG IV SOLR
40.0000 mg | Freq: Two times a day (BID) | INTRAVENOUS | Status: DC
  Administered 2024-06-21 – 2024-06-26 (×10): 40 mg via INTRAVENOUS
  Filled 2024-06-21 (×10): qty 10

## 2024-06-21 MED ORDER — ORAL CARE MOUTH RINSE: 15.0000 mL | OROMUCOSAL | Status: DC | PRN

## 2024-06-21 MED ORDER — SODIUM CHLORIDE 0.9% FLUSH: 3.0000 mL | INTRAVENOUS | Status: DC | PRN

## 2024-06-21 MED ORDER — POLYETHYLENE GLYCOL 3350 17 G PO PACK: 17.0000 g | PACK | Freq: Every day | ORAL | Status: DC

## 2024-06-21 MED ORDER — POTASSIUM CHLORIDE 10 MEQ/100ML IV SOLN
10.0000 meq | INTRAVENOUS | Status: AC
  Administered 2024-06-21 (×4): 10 meq via INTRAVENOUS
  Filled 2024-06-21 (×4): qty 100

## 2024-06-21 MED ORDER — METRONIDAZOLE 500 MG/100ML IV SOLN
500.0000 mg | Freq: Two times a day (BID) | INTRAVENOUS | Status: DC
  Administered 2024-06-21 – 2024-06-22 (×2): 500 mg via INTRAVENOUS
  Filled 2024-06-21 (×2): qty 100

## 2024-06-21 MED ORDER — ALBUTEROL SULFATE HFA 108 (90 BASE) MCG/ACT IN AERS: 4.0000 | INHALATION_SPRAY | Freq: Four times a day (QID) | RESPIRATORY_TRACT | Status: DC

## 2024-06-21 MED ORDER — FENTANYL 2500MCG IN NS 250ML (10MCG/ML) PREMIX INFUSION
0.0000 ug/h | INTRAVENOUS | Status: DC
  Administered 2024-06-21: 50 ug/h via INTRAVENOUS
  Administered 2024-06-23: 100 ug/h via INTRAVENOUS
  Filled 2024-06-21 (×2): qty 250

## 2024-06-21 MED ORDER — LIDOCAINE HCL (PF) 1 % IJ SOLN
INTRAMUSCULAR | Status: AC
  Administered 2024-06-21: 15 mL
  Filled 2024-06-21: qty 30

## 2024-06-21 MED ORDER — DOCUSATE SODIUM 50 MG/5ML PO LIQD: 100.0000 mg | Freq: Two times a day (BID) | ORAL | Status: DC

## 2024-06-21 MED ORDER — LIDOCAINE HCL (PF) 1 % IJ SOLN: 30.0000 mL | Freq: Once | INTRAMUSCULAR | Status: AC

## 2024-06-21 MED ORDER — IPRATROPIUM-ALBUTEROL 0.5-2.5 (3) MG/3ML IN SOLN: 3.0000 mL | Freq: Four times a day (QID) | RESPIRATORY_TRACT | Status: DC

## 2024-06-21 MED ORDER — ORAL CARE MOUTH RINSE
15.0000 mL | OROMUCOSAL | Status: DC
  Administered 2024-06-21 – 2024-06-26 (×57): 15 mL via OROMUCOSAL

## 2024-06-21 MED ORDER — SODIUM CHLORIDE 0.9 % IV SOLN
2.0000 g | Freq: Once | INTRAVENOUS | Status: AC
  Administered 2024-06-21: 2 g via INTRAVENOUS
  Filled 2024-06-21: qty 12.5

## 2024-06-21 MED ORDER — ONDANSETRON HCL 4 MG/2ML IJ SOLN
4.0000 mg | Freq: Four times a day (QID) | INTRAMUSCULAR | Status: DC | PRN
Start: 2024-06-21 — End: 2024-06-27

## 2024-06-21 MED ORDER — BISACODYL 10 MG RE SUPP
10.0000 mg | Freq: Every day | RECTAL | Status: DC | PRN
Start: 2024-06-21 — End: 2024-06-27

## 2024-06-21 MED ORDER — LIDOCAINE-EPINEPHRINE (PF) 2 %-1:200000 IJ SOLN
INTRAMUSCULAR | Status: AC
  Filled 2024-06-21: qty 20

## 2024-06-21 NOTE — Progress Notes (Signed)
 Right radial art stick attempt unsuccessful. Able to get blood back but unable to thread catheter.

## 2024-06-21 NOTE — ED Triage Notes (Signed)
 Pt bib EMS as possible code sepsis after family called for AMS. Pt recently discharged from hospital per EMS with recent stroke.

## 2024-06-21 NOTE — ED Notes (Signed)
 X-ray at bedside

## 2024-06-21 NOTE — ED Notes (Signed)
 Pt in bed, MD and two RN at bedside attempting central line placement, per off going shift multiple ivs have been attempted including two IO.  Pt is tachy on monitor, elevated RR, pt eyes are open and moving extremities, pt is not oriented, re oriented pt.  Pupils  are 4mm and reactive.

## 2024-06-21 NOTE — ED Notes (Signed)
 Date and time results received: 06/21/24 825 (use smartphrase .now to insert current time)  Test: Lactic acid Critical Value: 8.8  Name of Provider Notified: Zammit

## 2024-06-21 NOTE — ED Provider Notes (Signed)
 [place] EMERGENCY DEPARTMENT AT [facility]  Provider Note  CSN: [id number] Arrival date & time: 06/21/24 9395  History Chief Complaint  Patient presents with   Code Sepsis    NAJMO PARDUE is a 58 y.o. female brought to the ED via EMS from home for SOB and AMS starting when family went to change her clothes and found her unresponsive about prior to EMS arrival. She had a recent stroke s/p TNK and IR thrombectomy discharged to [facility] 6/11. Readmitted with hyperosmolar hyperglycemic coma, AKI/dehydration and lactic acidosis on 6/19 and then discharged home on 6/22. She has a long history of multiple medical problems as well as alcohol  and drug use. EMS found her to be tachycardic, tachypneic and hypotensive and brought her as a Code Sepsis. She should still be on Brilinta .    Home Medications Prior to Admission medications   Medication Sig Start Date End Date Taking? Authorizing Provider  amLODipine  (NORVASC ) 5 MG tablet Take 1 tablet (5 mg total) by mouth daily. 06/16/24   Johnson, Clanford L, MD  arformoterol  (BROVANA ) 15 MCG/2ML NEBU Take 2 mLs (15 mcg total) by nebulization 2 (two) times daily. 06/16/24   Vicci Afton CROME, MD  aspirin  81 MG chewable tablet Chew 1 tablet (81 mg total) by mouth daily. 06/06/24   Judithe Rocky BROCKS, NP  atorvastatin  (LIPITOR ) 80 MG tablet Take 1 tablet (80 mg total) by mouth daily. 06/16/24   Johnson, Clanford L, MD  budesonide  (PULMICORT ) 0.5 MG/2ML nebulizer solution Take 2 mLs (0.5 mg total) by nebulization 2 (two) times daily. 06/16/24   Johnson, Clanford L, MD  Buprenorphine  HCl-Naloxone  HCl 8-2 MG FILM Take 1 Film by mouth 3 (three) times daily for 7 days. 06/16/24 06/23/24  Johnson, Clanford L, MD  docusate sodium  (COLACE) 100 MG capsule Take 1 capsule (100 mg total) by mouth 2 (two) times daily as needed for mild constipation. 06/16/24   Johnson, Clanford L, MD  ezetimibe  (ZETIA ) 10 MG tablet Take 1 tablet (10 mg total) by mouth daily.  06/16/24 07/16/24  Vicci Afton CROME, MD  feeding supplement (ENSURE PLUS HIGH PROTEIN) LIQD Take 237 mLs by mouth 2 (two) times daily between meals for 14 days. 06/16/24 06/30/24  Johnson, Clanford L, MD  ipratropium-albuterol  (DUONEB) 0.5-2.5 (3) MG/3ML SOLN Take 3 mLs by nebulization every 4 (four) hours as needed (shortness of breath, wheezing). 06/16/24   Johnson, Clanford L, MD  metFORMIN  (GLUCOPHAGE -XR) 500 MG 24 hr tablet Take 1 tablet (500 mg total) by mouth daily with supper. 06/16/24   Johnson, Clanford L, MD  nicotine  (NICODERM CQ  - DOSED IN MG/24 HOURS) 14 mg/24hr patch Place 1 patch (14 mg total) onto the skin daily. 06/16/24   Johnson, Clanford L, MD  pantoprazole  (PROTONIX ) 40 MG tablet Take 1 tablet (40 mg total) by mouth daily. 06/16/24   Johnson, Clanford L, MD  polyethylene glycol (MIRALAX  / GLYCOLAX ) 17 g packet Take 17 g by mouth daily as needed for moderate constipation. 06/16/24   Johnson, Clanford L, MD  ticagrelor  (BRILINTA ) 90 MG TABS tablet Take 1 tablet (90 mg total) by mouth 2 (two) times daily. 06/16/24   Vicci Afton CROME, MD     Allergies    Patient has no known allergies.   Review of Systems   Review of Systems Please see HPI for pertinent positives and negatives  Physical Exam BP (!) 79/46   Pulse (!) 119   Temp 98.2 F (36.8 C)   Resp (!)  44   Ht 5' 7 (1.702 m)   Wt 99 kg   LMP 02/09/2012   SpO2 99%   BMI 34.18 kg/m   Physical Exam Vitals and nursing note reviewed.  Constitutional:      General: She is in acute distress.     Appearance: She is obese. She is ill-appearing.  HENT:     Head: Normocephalic and atraumatic.     Nose: Nose normal.     Mouth/Throat:     Mouth: Mucous membranes are dry.   Eyes:     Conjunctiva/sclera: Conjunctivae normal.    Cardiovascular:     Rate and Rhythm: Tachycardia present.  Pulmonary:     Comments: tachypnea Abdominal:     Palpations: Abdomen is soft.   Musculoskeletal:        General: No swelling.  Normal range of motion.     Cervical back: Neck supple.     Right lower leg: No edema.     Left lower leg: No edema.   Skin:    General: Skin is dry.     Comments: Cool to touch   Neurological:     Comments: Responds to painful stimuli    ED Results / Procedures / Treatments   EKG EKG Interpretation Date/Time:  Friday June 21 2024 06:26:53 EDT Ventricular Rate:  119 PR Interval:  142 QRS Duration:  131 QT Interval:  346 QTC Calculation: 487 R Axis:   247  Text Interpretation: Sinus tachycardia Right bundle branch block No significant change since last tracing Reconfirmed by Roselyn Dunnings [phone]) on 06/21/2024 7:28:19 AM  Procedures Central Line  Date/Time: 06/21/2024 7:22 AM  Performed by: Roselyn Dunnings NOVAK, MD Authorized by: Roselyn Dunnings NOVAK, MD   Consent:    Consent obtained:  Emergent situation Pre-procedure details:    Indication(s): central venous access and insufficient peripheral access     Hand hygiene: Hand hygiene performed prior to insertion     Sterile barrier technique: All elements of maximal sterile technique followed     Skin preparation:  Chlorhexidine  Anesthesia:    Anesthesia method:  Local infiltration   Local anesthetic:  Lidocaine  1% WITH epi Procedure details:    Location:  R internal jugular, R femoral and R subclavian   Patient position:  Trendelenburg   Procedural supplies:  Triple lumen   Catheter size:  8 Fr   Landmarks identified: yes     Ultrasound guidance: yes     Ultrasound guidance timing: real time     Sterile ultrasound techniques: Sterile gel and sterile probe covers were used     Number of attempts:  4   Successful placement: yes   Post-procedure details:    Post-procedure:  Dressing applied and line sutured   Assessment:  Blood return through all ports   Procedure completion:  Tolerated well, no immediate complications Comments:     Unsuccessful R internal jugular x 2 and R subclavian x 1 before successful R  femoral IO LINE INSERTION  Date/Time: 06/21/2024 7:24 AM  Performed by: Roselyn Dunnings NOVAK, MD Authorized by: Roselyn Dunnings NOVAK, MD   Consent:    Consent obtained:  Emergent situation Pre-procedure details:    Site preparation:  Alcohol  Anesthesia:    Anesthesia method:  Local infiltration   Local anesthetic:  Lidocaine  2% w/o epi Procedure details:    Insertion site:  R proximal tibia   Insertion device:  Drill device   Insertion: Needle was inserted through the bony cortex  Number of attempts:  1   Insertion confirmation:  Aspiration of blood/marrow (would not flush) Post-procedure details:    Procedure completion:  Tolerated IO LINE INSERTION  Date/Time: 06/21/2024 7:24 AM  Performed by: Roselyn Carlin NOVAK, MD Authorized by: Roselyn Carlin NOVAK, MD   Consent:    Consent obtained:  Emergent situation Pre-procedure details:    Site preparation:  Alcohol  Procedure details:    Insertion site:  L proximal tibia   Insertion device:  Drill device   Insertion: Needle was inserted through the bony cortex     Number of attempts:  1   Insertion confirmation:  Aspiration of blood/marrow (would not flush) Post-procedure details:    Procedure completion:  Tolerated .Critical Care  Performed by: Roselyn Carlin NOVAK, MD Authorized by: Roselyn Carlin NOVAK, MD   Critical care provider statement:    Critical care time (minutes):  75   Critical care time was exclusive of:  Separately billable procedures and treating other patients   Critical care was necessary to treat or prevent imminent or life-threatening deterioration of the following conditions:  Sepsis and shock   Critical care was time spent personally by me on the following activities:  Development of treatment plan with patient or surrogate, discussions with consultants, evaluation of patient's response to treatment, examination of patient, ordering and review of laboratory studies, ordering and review of radiographic studies,  ordering and performing treatments and interventions, pulse oximetry, re-evaluation of patient's condition and review of old charts   Medications Ordered in the ED Medications  lidocaine  HCl (PF) (XYLOCAINE ) 2 % injection (has no administration in time range)  lactated ringers  bolus 1,000 mL (1,000 mLs Intravenous New Bag/Given 06/21/24 0727)    And  lactated ringers  bolus 1,000 mL (has no administration in time range)    And  lactated ringers  bolus 1,000 mL (has no administration in time range)  ceFEPIme (MAXIPIME) 2 g in sodium chloride  0.9 % 100 mL IVPB (2 g Intravenous New Bag/Given 06/21/24 0737)  metroNIDAZOLE  (FLAGYL ) IVPB 500 mg (has no administration in time range)  vancomycin  (VANCOCIN ) IVPB 1000 mg/200 mL premix (has no administration in time range)    Initial Impression and Plan  Patient arrives from EMS with sepsis vs metabolic disorder, reportedly started acutely prior to arrival. She was hypotensive with EMS, normotensive on arrival here but dropped BP after a short time. Poorly perfusing peripherally, difficult IV access, multiple attempts at central line and IO unsuccessful but eventually was able to get a R femoral central line. Labs imaging IVF and broad spectrum Abx ordered pending results.   ED Course   Clinical Course as of 06/21/24 0737  Fri Jun 21, 2024  9263 Care of the patient will be signed out to Dr. Suzette at the change of shift.  [CS]    Clinical Course User Index [CS] Roselyn Carlin NOVAK, MD     MDM Rules/Calculators/A&P Medical Decision Making Amount and/or Complexity of Data Reviewed Labs: ordered. Radiology: ordered.  Risk Prescription drug management.     Final Clinical Impression(s) / ED Diagnoses Final diagnoses:  Altered mental status, unspecified altered mental status type  Hypotension, unspecified hypotension type    Rx / DC Orders ED Discharge Orders     None        Roselyn Carlin NOVAK, MD 06/21/24 [phone]

## 2024-06-21 NOTE — Progress Notes (Signed)
 Elink following for sepsis protocol.

## 2024-06-21 NOTE — Progress Notes (Signed)
 1740 Patient transported to and from CT,on the ventilator, without incident.

## 2024-06-21 NOTE — ED Notes (Signed)
 Pt is more alert, pt nodding yes or no to questions, boyfriend states that she had a stroke recently and hasn't been  able to talk since then, per him all she will say is I don't know

## 2024-06-21 NOTE — H&P (Addendum)
 History and Physical    Patient: Rita [name] FMW:[id number] DOB: 05-12-1966 DOA: 06/21/2024 DOS: the patient was seen and examined on 06/21/2024 PCP: [facility]  Patient coming from: Home  Chief Complaint:  Chief Complaint  Patient presents with   Code Sepsis   HPI: Rita [name] is a 58 y.o. female with medical history significant for Recent (05/28/24) large left MCA territory infarcts due to left distal ICA stent and MCA occlusion status post TNK and IR with TICI3 reperfusion in June 2025, HTN, HLD, h/o polysubstance abuse including alcohol , cocaine and marijuana abuse, tobacco abuse, dysphagia, PAD and obesity presents from home after being found unresponsive on 06/21/2024 with concerns about possible sepsis and pneumonia. -- She was apparently unresponsive for at least 20 minutes prior to EMS arrival. -EDP placed right femoral central line -At the time of my evaluation patient was responsive and verbal--- she requested full CODE STATUS despite previously being DNR during her last admission--RN Rosina Lamer and RN Zachary Horn were Both Present during the conversation about CODE STATUS and concern for PRBC transfusion.  -- Additional history obtained from patient's boyfriend Rita [name] and patient's sister Rita [name] -- Unable to reach patient's daughter Rita [name].  No fever  Or chills  - Patient with dyspnea, cough and nausea with dry heaving -- Patient had significant amount of melanotic diarrhea - She was transiently hypotensive, tachycardic, tachypneic and hypothermic with temperature down to 96.1 --WBC 34.8 >>27.6 Lactic Acid 8.8 >>> 9.0 >>> 9.0 , AG 26 >>21 -C. difficile negative stool culture pending Hgb 11.8 >>6.9, stool occult blood positive Platelets 911 >>548 - Creatinine 2.11 >>2.26 -Bicarb 18>> 14 - Glucose 379 >>258 -Beta-hydroxybutyrate currently 0.31 -AST 30 >>51  ALT 30 >>55 - Troponin 41 >>39, EKG sinus tachycardia with right bundle branch block - UA not  suggestive of UTI BAL--Neg  CT abdomen and pelvis shows:- IMPRESSION: 1. Diffuse mild-to-moderate dilatation of the largely fluid-filled small bowel and mildly distended fluid-filled right and transverse colon and splenic flexure of the colon, without bowel wall thickening. Findings are most suggestive of a paralytic ileus as can be seen with enterocolitis. 2. Chronic circumferential wall thickening in the fluid-filled patulous lower thoracic esophagus, most commonly due to reflux esophagitis, with Barrett's esophagus or neoplasm not excluded by CT. 3. Small layering right pleural effusion, new.  -CT head without new acute findings, evolving subacute to chronic left MCA territory stroke previously noted noted again  CT chest done post CPR shows:--  IMPRESSION: 1. Moderate right hydropneumothorax.  Small left pleural effusion. 2. Extensive pneumomediastinum with gas extending into the neck, retroperitoneum, and peritoneum. 3. Airspace opacities with a perihilar distribution greatest in the lower lobes. Findings may be due to aspiration, pneumonia, or atelectasis. 4. Suspected fractures in the left 5th-7th ribs and the anterior right 4th-9th ribs. Question fracture of the manubrium. Motion limits evaluation. 5. Aortic Atherosclerosis (ICD10-I70.0).  Review of Systems: As mentioned in the history of present illness. All other systems reviewed and are negative. Past Medical History:  Diagnosis Date   Arthritis    qwhere (02/22/2018)   Barrett's esophagus with esophagitis 03/26/2013   Confirmed via biopsy in 2011.  Subsequent EGD in 2014 and 2018 with biopsies consistent with reflux.  No evidence of Barrett's at that time.   Chronic abdominal pain    Chronic lower back pain    COPD (chronic obstructive pulmonary disease) (HCC)    Depression    Dyspnea    GERD (gastroesophageal reflux disease)  H/O acute pancreatitis    Heart murmur    Hiatal hernia    High cholesterol     Hypertension    Peptic ulcer    Pneumonia 2000s X 1   Stroke (HCC)    Tobacco use 03/26/2013   Past Surgical History:  Procedure Laterality Date   AORTIC ARCH ANGIOGRAPHY N/A 01/19/2018   Procedure: AORTIC ARCH ANGIOGRAPHY;  Surgeon: Harvey Carlin BRAVO, MD;  [facility];  Service: Cardiovascular;  Laterality: N/A;   BIOPSY  04/17/2017   Procedure: BIOPSY;  Surgeon: Lamar CHRISTELLA Hollingshead, MD;  Location: [facility];  Service: Endoscopy;;  esophageal   CARDIAC CATHETERIZATION     CAROTID-SUBCLAVIAN BYPASS GRAFT Left 03/19/2018   Procedure: BYPASS GRAFT CAROTID-SUBCLAVIAN;  Surgeon: Harvey Carlin BRAVO, MD;  Location: [facility] OR;  Service: Vascular;  Laterality: Left;   CESAREAN SECTION  1985; 1988   COLONOSCOPY WITH PROPOFOL  N/A 04/17/2017   Procedure: COLONOSCOPY WITH PROPOFOL ;  Surgeon: Lamar CHRISTELLA Hollingshead, MD;  [facility]: [facility];  Service: Endoscopy;  Laterality: N/A;  100   ESOPHAGOGASTRODUODENOSCOPY  Oct 2011   Dr. Golda: ulcer in distal esophagus, soft stricture at GE junction s/p balloon dilation PATH: BARRETT'S   ESOPHAGOGASTRODUODENOSCOPY (EGD) WITH ESOPHAGEAL DILATION N/A 03/27/2013   Procedure: ESOPHAGOGASTRODUODENOSCOPY (EGD) WITH ESOPHAGEAL DILATION;  Surgeon: Lamar CHRISTELLA Hollingshead, MD;  [facility];  Service: Endoscopy;  Laterality: N/A;  possible dilation   ESOPHAGOGASTRODUODENOSCOPY (EGD) WITH PROPOFOL  N/A 01/02/2017   Procedure: ESOPHAGOGASTRODUODENOSCOPY (EGD) WITH PROPOFOL ;  Surgeon: Lamar CHRISTELLA Hollingshead, MD;  [facility];  Service: Endoscopy;  Laterality: N/A;  with possible esophageal dilation   ESOPHAGOGASTRODUODENOSCOPY (EGD) WITH PROPOFOL  N/A 04/17/2017   Procedure: ESOPHAGOGASTRODUODENOSCOPY (EGD) WITH PROPOFOL ;  Surgeon: Lamar CHRISTELLA Hollingshead, MD;  [facility];  Service: Endoscopy;  Laterality: N/A;   EUS N/A 05/09/2013   Procedure: UPPER ENDOSCOPIC ULTRASOUND (EUS) LINEAR;  Surgeon: Toribio SHAUNNA Cedar, MD;  Location: [facility];  Service: Endoscopy;   Laterality: N/A;   FRACTURE SURGERY     pt. denies   IR ANGIO INTRA EXTRACRAN SEL COM CAROTID INNOMINATE BILAT MOD SED  09/06/2022   IR ANGIO INTRA EXTRACRAN SEL COM CAROTID INNOMINATE BILAT MOD SED  09/13/2022   IR ANGIO VERTEBRAL SEL SUBCLAVIAN INNOMINATE BILAT MOD SED  09/13/2022   IR ANGIO VERTEBRAL SEL SUBCLAVIAN INNOMINATE UNI R MOD SED  09/06/2022   IR ANGIOGRAM FOLLOW UP STUDY  09/13/2022   IR CT HEAD LTD  09/08/2022   IR CT HEAD LTD  05/27/2024   IR CT HEAD LTD  05/27/2024   IR NEURO EACH ADD'L AFTER BASIC UNI LEFT (MS)  09/13/2022   IR PATIENT EVAL TECH 0-60 MINS  05/28/2024   IR PERCUTANEOUS ART THROMBECTOMY/INFUSION INTRACRANIAL INC DIAG ANGIO  05/27/2024   IR TRANSCATH/EMBOLIZ  09/08/2022   IR US  GUIDE VASC ACCESS RIGHT  09/08/2022   IR US  GUIDE VASC ACCESS RIGHT  09/06/2022   KNEE ARTHROSCOPY Right    LAPAROSCOPIC CHOLECYSTECTOMY     LEFT HEART CATH AND CORONARY ANGIOGRAPHY N/A 02/26/2018   Procedure: LEFT HEART CATH AND CORONARY ANGIOGRAPHY;  Surgeon: Verlin Lonni BIRCH, MD;  [facility];  Service: Cardiovascular;  Laterality: N/A;   MALONEY DILATION N/A 04/17/2017   Procedure: AGAPITO DILATION;  Surgeon: Lamar CHRISTELLA Hollingshead, MD;  [facility];  Service: Endoscopy;  Laterality: N/A;   PATELLA FRACTURE SURGERY Left    POLYPECTOMY  04/17/2017   Procedure: POLYPECTOMY;  Surgeon:  Lamar CHRISTELLA Hollingshead, MD;  [facility];  Service: Endoscopy;;   RADIOLOGY WITH ANESTHESIA N/A 09/08/2022   Procedure: L ICA Aneurysm Embolazation;  Surgeon: Dolphus Carrion, MD;  Location: [facility];  Service: Radiology;  Laterality: N/A;   RADIOLOGY WITH ANESTHESIA N/A 05/27/2024   Procedure: RADIOLOGY WITH ANESTHESIA;  Surgeon: Radiologist, Medication, MD;  Location: [facility];  Service: Radiology;  Laterality: N/A;   TEE WITHOUT CARDIOVERSION N/A 01/23/2019   Procedure: TRANSESOPHAGEAL ECHOCARDIOGRAM (TEE);  Surgeon: Jeffrie Oneil BROCKS, MD;  Location: [facility] ENDOSCOPY;  Service: Cardiovascular;   Laterality: N/A;   TUBAL LIGATION  1992   UPPER EXTREMITY ANGIOGRAPHY N/A 01/19/2018   Procedure: UPPER EXTREMITY ANGIOGRAPHY;  Surgeon: Harvey Carlin BRAVO, MD;  [facility];  Service: Cardiovascular;  Laterality: N/A;   Social History:  reports that she has been smoking cigarettes. She started smoking about 41 years ago. She has a 20.9 pack-year smoking history. She has never used smokeless tobacco. She reports that she does not drink alcohol  and does not use drugs.  No Known Allergies  Family History  Problem Relation Age of Onset   Asthma Mother    Heart failure Mother    Cancer Mother        pancreatic   Diabetes Mother    Hypertension Mother    Stroke Mother    Pancreatic cancer Mother        deceased   Heart failure Father    Diabetes Father    Colon cancer Neg Hx     Prior to Admission medications   Medication Sig Start Date End Date Taking? Authorizing Provider  amLODipine  (NORVASC ) 5 MG tablet Take 1 tablet (5 mg total) by mouth daily. 06/16/24  Yes Johnson, Clanford L, MD  arformoterol  (BROVANA ) 15 MCG/2ML NEBU Take 2 mLs (15 mcg total) by nebulization 2 (two) times daily. 06/16/24  Yes Johnson, Clanford L, MD  aspirin  81 MG chewable tablet Chew 1 tablet (81 mg total) by mouth daily. 06/06/24  Yes Judithe Rocky BROCKS, NP  atorvastatin  (LIPITOR ) 80 MG tablet Take 1 tablet (80 mg total) by mouth daily. 06/16/24  Yes Johnson, Clanford L, MD  budesonide  (PULMICORT ) 0.5 MG/2ML nebulizer solution Take 2 mLs (0.5 mg total) by nebulization 2 (two) times daily. 06/16/24  Yes Johnson, Clanford L, MD  Buprenorphine  HCl-Naloxone  HCl 8-2 MG FILM Take 1 Film by mouth 3 (three) times daily for 7 days. 06/16/24 06/23/24 Yes Johnson, Clanford L, MD  ezetimibe  (ZETIA ) 10 MG tablet Take 1 tablet (10 mg total) by mouth daily. 06/16/24 07/16/24 Yes Johnson, Clanford L, MD  feeding supplement (ENSURE PLUS HIGH PROTEIN) LIQD Take 237 mLs by mouth 2 (two) times daily between meals for 14 days.  06/16/24 06/30/24 Yes Johnson, Clanford L, MD  ipratropium-albuterol  (DUONEB) 0.5-2.5 (3) MG/3ML SOLN Take 3 mLs by nebulization every 4 (four) hours as needed (shortness of breath, wheezing). 06/16/24  Yes Johnson, Clanford L, MD  metFORMIN  (GLUCOPHAGE -XR) 500 MG 24 hr tablet Take 1 tablet (500 mg total) by mouth daily with supper. 06/16/24  Yes Johnson, Clanford L, MD  pantoprazole  (PROTONIX ) 40 MG tablet Take 1 tablet (40 mg total) by mouth daily. 06/16/24  Yes Johnson, Clanford L, MD  ticagrelor  (BRILINTA ) 90 MG TABS tablet Take 1 tablet (90 mg total) by mouth 2 (two) times daily. 06/16/24  Yes Johnson, Clanford L, MD  nicotine  (NICODERM CQ  - DOSED IN MG/24 HOURS) 14 mg/24hr patch Place 1 patch (14 mg total) onto the skin  daily. Patient not taking: Reported on 06/21/2024 06/16/24   Vicci Afton CROME, MD    Physical Exam: Vitals:   06/21/24 1530 06/21/24 1540 06/21/24 1550 06/21/24 1610  BP: 129/64 (!) 149/81 (!) 152/97 (!) 145/78  Pulse: 97 (!) 136 (!) 135 (!) 130  Resp: 12 19 (!) 23 (!) 24  Temp: 97.7 F (36.5 C)     TempSrc:      SpO2: 100% 100% 100% 100%  Weight:      Height:    5' 7 (1.702 m)    Physical Exam Gen:-Patient was initially awake, alert, she is now sedated and intubated  HEENT:--- ET tube Lungs-diminished somewhat on the right, no wheezing, no rhonchi  CV- S1, S2 normal, RRR, tachycardic Abd-diminished bowel sounds, abdomen was tender and distended prior to intubation and sedation,  -extremity/Skin:- No  edema,   good pedal pulses  Neuro-Psych-initially patient had no new focal deficits, she is now intubated and sedated and neuropsych exam is currently limited GU--Foley in situ-placed on 06/21/2024 MSK-right groin femoral catheter placed on 06/21/2024 Rectal Tube--with melanotic loose stools  Data Reviewed: - Patient had significant amount of melanotic diarrhea - She was transiently hypotensive, tachycardic, tachypneic and hypothermic with temperature down to  96.1 --WBC 34.8 >>27.6 Lactic Acid 8.8 >>> 9.0 >>> 9.0 , AG 26 >>21 -C. difficile negative stool culture pending Hgb 11.8 >>6.9, stool occult blood positive Platelets 911 >>548 - Creatinine 2.11 >>2.26 -Bicarb 18>> 14 - Glucose 379 >>258 -Beta-hydroxybutyrate currently 0.31 -AST 30 >>51  ALT 30 >>55 - Troponin 41 >>39, EKG sinus tachycardia with right bundle branch block - UA not suggestive of UTI BAL--Neg  CT abdomen and pelvis shows:- IMPRESSION: 1. Diffuse mild-to-moderate dilatation of the largely fluid-filled small bowel and mildly distended fluid-filled right and transverse colon and splenic flexure of the colon, without bowel wall thickening. Findings are most suggestive of a paralytic ileus as can be seen with enterocolitis. 2. Chronic circumferential wall thickening in the fluid-filled patulous lower thoracic esophagus, most commonly due to reflux esophagitis, with Barrett's esophagus or neoplasm not excluded by CT. 3. Small layering right pleural effusion, new.  -CT head without new acute findings, evolving subacute to chronic left MCA territory stroke previously noted noted again  CT chest done post CPR shows:--  IMPRESSION: 1. Moderate right hydropneumothorax.  Small left pleural effusion. 2. Extensive pneumomediastinum with gas extending into the neck, retroperitoneum, and peritoneum. 3. Airspace opacities with a perihilar distribution greatest in the lower lobes. Findings may be due to aspiration, pneumonia, or atelectasis. 4. Suspected fractures in the left 5th-7th ribs and the anterior right 4th-9th ribs. Question fracture of the manubrium. Motion limits evaluation. 5. Aortic Atherosclerosis (ICD10-I70.0).  Assessment and Plan: 1)Presumed severe sepsis with septic shock --- suspect secondary to aspiration pneumonia. --WBC 34.8 >>27.6 Lactic Acid 8.8 >>> 9.0 >>> 9.0 , AG 26 >>21 -She was transiently hypotensive, tachycardic, tachypneic and hypothermic  with temperature down to 96.1 -- Cannot rule out intra-abdominal pathology - Continue IV Rocephin , azithromycin and Flagyl  pending further culture data -- Imaging studies as above - Patient currently intubated and sedated - Continue bronchodilators - Currently requiring IV Levophed for pressure support - Continue aggressive IV fluids  2)Acute hypoxic respiratory failure--- s/p cardiopulmonary arrest,  - Successfully resuscitated  -currently intubated and sedated Vent Setting :- PRVC/20/440/100%/5 - ABG noted - Multiple left and right-sided rib fractures after successful CPR/resuscitation as noted above on CT chest -Continue ventilator support --Further management per PCCM  3)Possible  Perforated Viscus------CT chest done post CPR shows:--  Moderate right hydropneumothorax.  Small left pleural effusion And  Extensive pneumomediastinum with gas extending into the neck, retroperitoneum, and peritoneum. ---- Defer to PCCM service if patient needs a chest tube  4)Enterocolitis--- CT abdomen and pelvis suggest enterocolitis -WBC 34.8 >>27.6 Lactic Acid 8.8 >>> 9.0 >>> 9.0 , AG 26 >>21 -C. difficile negative stool culture pending - Concerns about perforated viscus as noted above #3 - Stool culture/GI pathogen test pending - Continue Rocephin /Flagyl  for now  5)AKI----acute kidney injury --due to ongoing diarrhea poor oral intake dehydration and hypotension -Anion gap metabolic acidosis noted -- Creatinine 2.11 >>2.26 (creatinine was 0.85 on 06/16/2024 and 0.89 on 06/15/2024) -Bicarb 18>> 14 AG 26 >>21  renally adjust medications, avoid nephrotoxic agents / dehydration  / hypotension --Continue IV fluids and bicarb supplementation  6) acute on chronic anemia due to acute blood loss/GI bleed-- Hgb 11.8 >>6.9, stool occult blood positive Platelets 911 >>548 --- IV Protonix  as ordered -Type and cross and transfuse 2 units of PRBC at this time- - GI consult for possible abdominal  evaluation once patient is  stable  7)DM2--  Glucose 379 >>258 .  A1c 6.3 reflecting good excellent control PTA -Beta-hydroxybutyrate currently 0.31 - Hold metformin  Use Novolog /Humalog Sliding scale insulin  with Accu-Cheks/Fingersticks as ordered   8)H/o Recent Lt MCA Stroke ---June 2025 - Hold Brilinta  due to GI bleed requiring transfusion - Continue aspirin   9) mild transaminitis--- suspect due to hypotension/shock liver  Prophylaxis--IV Protonix  for GI prophylaxis/GI bleed, teds and SCDs for DVT prophylaxis in the setting of acute GI bleed  -Procedures/Lines-- -status post successful CPR/resuscitation for cardiopulmonary arrest -status post intubation - Status post right femoral line placement - Status post Foley and rectal tube placement   Advance Care Planning:   Full Code  Consults: PCCM/Radiology  Family Communication: - Additional history obtained from patient's boyfriend Rita [name] and patient's sister Rita [name] -- Unable to reach patient's daughter Rita [name].   Severity of Illness: The appropriate patient status for this patient is INPATIENT. Inpatient status is judged to be reasonable and necessary in order to provide the required intensity of service to ensure the patient's safety. The patient's presenting symptoms, physical exam findings, and initial radiographic and laboratory data in the context of their chronic comorbidities is felt to place them at high risk for further clinical deterioration. Furthermore, it is not anticipated that the patient will be medically stable for discharge from the hospital within 2 midnights of admission.   * I certify that at the point of admission it is my clinical judgment that the patient will require inpatient hospital care spanning beyond 2 midnights from the point of admission due to high intensity of service, high risk for further deterioration and high frequency of surveillance required.*  Author: Rendall Carwin, MD 06/21/2024 4:27  PM  For on call review [email or web address].

## 2024-06-21 NOTE — ED Notes (Addendum)
 Pts daughter, Rita [name], emergency contact number information updated to current phone number

## 2024-06-21 NOTE — Progress Notes (Signed)
   Follow-up CT chest findings discussed with on-call critical care attending Dr. Orlin Fairly -- Patient's current condition and status reviewed --Dr. Fairly requested patient now be transferred to [facility] rather than [facility] --Patient will most likely need a chest tube ---Additional chart review, orders and input from [facility] attending Dr. Orlin Fairly appreciated - Patient remains a full code -Remains intubated and sedated - Please see EMR for additional orders -Please see full H&P dictated earlier -Total additional critical care time 49 mins  Rendall Carwin, MD  -

## 2024-06-21 NOTE — Progress Notes (Signed)
 At approximately 1430 this afternoon, Dr. Rendall, Rosina Lamer RN, and I had a conversation with Rita [name] about code status. We had been talking with her the entire time and throughout she was nodding/gesturing appropriately. It was clear that she could understand what we were telling her. We asked her several times if she would want to be given CPR, and if she would want a breathing tube down her throat. Every time she said yes. Per her request the patient was left full code.

## 2024-06-21 NOTE — ED Provider Notes (Signed)
  Physical Exam  BP (!) 112/98   Pulse (!) 112   Temp 98.6 F (37 C)   Resp (!) 33   Ht 5' 7 (1.702 m)   Wt 99 kg   LMP 02/09/2012   SpO2 100%   BMI 34.18 kg/m   Physical Exam Vitals reviewed.  HENT:     Head: Normocephalic.     Nose: Nose normal.     Mouth/Throat:     Mouth: Mucous membranes are dry.   Eyes:     Pupils: Pupils are equal, round, and reactive to light.    Cardiovascular:     Rate and Rhythm: Tachycardia present.  Pulmonary:     Comments: Tachypnea Abdominal:     Palpations: There is no mass.   Musculoskeletal:        General: No swelling.     Cervical back: Normal range of motion.   Skin:    General: Skin is warm.   Neurological:     Mental Status: She is alert.     Comments: Patient alert but only responding to painful stimuli    Procedures  Procedures  ED Course / MDM   Clinical Course as of 06/21/24 0944  Fri Jun 21, 2024  [id number] Care of the patient will be signed out to Dr. Lamyra Malcolm at the change of shift.  [CS]    Clinical Course User Index [CS] Roselyn Carlin NOVAK, MD   CRITICAL CARE Performed by: Fairy Sermon Total critical care time: 45 minutes Critical care time was exclusive of separately billable procedures and treating other patients. Critical care was necessary to treat or prevent imminent or life-threatening deterioration. Critical care was time spent personally by me on the following activities: development of treatment plan with patient and/or surrogate as well as nursing, discussions with consultants, evaluation of patient's response to treatment, examination of patient, obtaining history from patient or surrogate, ordering and performing treatments and interventions, ordering and review of laboratory studies, ordering and review of radiographic studies, pulse oximetry and re-evaluation of patient's condition.     Medical Decision Making Amount and/or Complexity of Data Reviewed Labs: ordered. Radiology:  ordered.  Risk Prescription drug management. Decision regarding hospitalization.   Acute sepsis.  Pt admitted to icu by hospitalist       Sermon Fairy, MD 06/21/24 [phone]

## 2024-06-21 NOTE — Progress Notes (Addendum)
-   Responded to CODE BLUE in ICU room 8 at [facility] - -58 year old with history of recent large left MCA stroke, HTN, HLD, persistent abuse including cocaine and THC, tobacco abuse, PAD, who presented earlier after being found unresponsive with diarrhea and leukocytosis--with CT consistent with paralytic ileus-and enterocolitis--with heme positive stools significant abdominal discomfort with abdominal distention and persistent nausea and dry heaving... -NG tube placement attempted unsuccessfully Patient found to be bradycardic with heart rate in the 20s, she was unresponsive and in respiratory distress, patient initially had a weak slow pulse -Bag mask ventilation initiated, bicarb given --Patient received atropine x 2 doses --Patient lost her pulse and -Developed tachyarrhythmia concerning for possible pulseless V-fib--- prior to preparing for possible shock--- epinephrine was given  --- Patient developed sinus tachycardia and regained pulse....  --Intubated by anesthesiologist at bedside - Patient Sister Elin Dixons and brother-in-law updated initially by phone later at bedside --Left voicemail for patient's daughter Ms. Levon Sieve ---[phone] and also left a voicemail for patient's significant other--Mr Marty Roses  -Follow workup revealed Hgb dropped to 6.9 will transfuse PRBC and continue IV Protonix  given history of GI bleeds - Case discussed with on-call PCCM attending Dr. Donnice Beals--- who kindly accepted patient for transfer to critical care service at [facility])  - Patient remains intubated, sedated - Total critical care time including time spent coordinating care with family and PCCM attending is 43 minutes  Rendall Carwin, MD

## 2024-06-21 NOTE — Progress Notes (Signed)
 Patient awake, alert, nods/gestures appropriately. 2 nurse verification for patient's blood consent. Dr. KYM Carwin to bedside due to multiple bowel movements with large amounts of fluid loss. Code status discussed with patient. Patient made aware that her last admission she was a DNR/DNI. She nodded yes. But then patient nodded yes when asked if she wanted CPR to restart her heart. Patient also nodded yes when asked if she would want to be placed on a breathing machine if she could not breathe on her own. Full code status documented in chart.

## 2024-06-21 NOTE — H&P (Signed)
 NAME:  Rita [name], MRN:  [id number], DOB:  10/08/1966, LOS: 0 ADMISSION DATE:  06/21/2024, CONSULTATION DATE:  06/21/2024 REFERRING MD:  Pearlean Manus, MD, CHIEF COMPLAINT:  Sepsis  History of Present Illness:  30 yu/o female with PMH for PAD, Cociane and THC abuse, HLD, HTN who recently suffered a left MCA stroke who presented with found unresponsive with diarrhea, CT abd showing paralytic ileus and enterocolitis with with heme positive stools significant abdominal discomfort with abdominal distention and persistent nausea and dry heaving... -NG tube placement attempted unsuccessfully. She had episode of bradycardia and rates in the 20's.  Patient had a weak pulse and received Atropine x 2, but then lost her pulse and possibly had V fib/Vtach and then had PEA.  and CPR initiated and given Epinephrine.  ROCS achieved but she was intubated by Anesthesia.   She was hemoccult positive.  She was ordered for 2 units PRBC. Post code cxr showing a right hydropneumothorax and rib fracture possibly during CPR.   Pertinent  Medical History  Arthritis, Barrett's esophagus with esophagitis (03/26/2013), Chronic abdominal pain, Chronic lower back pain, COPD (chronic obstructive pulmonary disease) (HCC), Depression, Dyspnea, GERD (gastroesophageal reflux disease), H/O acute pancreatitis, Heart murmur, Hiatal hernia, High cholesterol, Hypertension, Peptic ulcer, Pneumonia (2000s X 1), Stroke (HCC), and Tobacco use (03/26/2013).    Significant Hospital Events: Including procedures, antibiotic start and stop dates in addition to other pertinent events   6/27: Admit to ICU  Interim History / Subjective:  N/a  Objective    Blood pressure (!) 130/92, pulse (!) 119, temperature 99.3 F (37.4 C), resp. rate (!) 37, height 5' 7 (1.702 m), weight 98.6 kg, last menstrual period 02/09/2012, SpO2 100%.    Vent Mode: PRVC FiO2 (%):  [100 %] 100 % Set Rate:  [20 bmp-24 bmp] 24 bmp Vt Set:  [440 mL-490 mL]  490 mL PEEP:  [5 cmH20] 5 cmH20   Intake/Output Summary (Last 24 hours) at 06/21/2024 2332 Last data filed at 06/21/2024 1853 Gross per 24 hour  Intake 6450.17 ml  Output 1000 ml  Net 5450.17 ml   Filed Weights   06/21/24 0612 06/21/24 1040  Weight: 99 kg 98.6 kg    Examination: General: intubated and sedated NAD HENT: PERRLA no Icterus EOMI Lungs: Supple no LN no JVD Cardiovascular: CTA diminished bs b/l no wheezes no rales Abdomen: reg s1s2 no murmurs gallops or rubs Extremities: no cyanosis clubbing or edema Neuro: sedated and intubated   Resolved problem list   Assessment and Plan  Acute Hypoxic Respiratory Failure Continue with Vent management SBT/SAT daily  HAP prevention Severe sepsis, source likely pneumonia Broad spectrum antibiotics Tracheal Aspirates for culture Wean pressors as possible Lactic Acidosis IV fluids Bicarb dri\p Monitor VBG/ABG Right Hydropneumothorax S/p chest tube placement with resolution Chest tube to LWS 20cwp Pleural effusions b/l Chest tube on right Send for pleural effusion studies Lactic Acidosis/Severe metabolic acidosis IVF and Bicarb drip AKI Monitor serial Cr Monitor Is/Os GI bleed Received 2 units PRBC and HgB responded appropriately ABLA Monitor H/H Received 2 units PRBC H/o CVA Continue to monitor  Best Practice (right click and Reselect all SmartList Selections daily)   Diet/type: NPO DVT prophylaxis prophylactic heparin   Pressure ulcer(s): N/A GI prophylaxis: H2B Lines: Right Femoral Central Line Foley:  N/A Code Status:  full code   Labs   CBC: Recent Labs  Lab 06/15/24 0258 06/15/24 1042 06/16/24 0408 06/21/24 0722 06/21/24 1539 06/21/24 2138  WBC 14.4*  --  12.7* 34.8* 27.6* 33.1*  NEUTROABS  --   --  7.9* 29.8*  --   --   HGB 7.3* 8.3* 7.9* 11.8* 6.9* 10.9*  HCT 21.1* 24.7* 23.6* 36.1 22.3* 31.7*  MCV 88.3  --  90.8 90.5 96.5 90.1  PLT 375  --  439* 911* 548* 455*    Basic Metabolic  Panel: Recent Labs  Lab 06/15/24 0258 06/16/24 0408 06/21/24 0722 06/21/24 1539 06/21/24 2138  NA 142 142 136 140 134*  K 3.3* 3.1* 3.2* 4.8 4.7  CL 105 106 92* 105 102  CO2 28 27 18* 14* 18*  GLUCOSE 119* 113* 379* 258* 249*  BUN 31* 19 68* 78* 76*  CREATININE 0.89 0.85 2.11* 2.26* 2.59*  CALCIUM  7.8* 7.9* 8.8* 7.3* 6.4*  MG  --  1.7  --   --  1.2*  PHOS  --   --   --  6.5*  --    GFR: Estimated Creatinine Clearance: 28.6 mL/min (A) (by C-G formula based on SCr of 2.59 mg/dL (H)). Recent Labs  Lab 06/15/24 0258 06/16/24 0408 06/21/24 0611 06/21/24 0722 06/21/24 0845 06/21/24 1539 06/21/24 2138 06/21/24 2140  PROCALCITON 1.31  --   --   --   --   --   --   --   WBC 14.4* 12.7*  --  34.8*  --  27.6* 33.1*  --   LATICACIDVEN  --   --  8.8*  --  >9.0* >9.0*  --  1.8    Liver Function Tests: Recent Labs  Lab 06/21/24 0722 06/21/24 1539 06/21/24 2138  AST 30 51* 130*  ALT 30 55* 122*  ALKPHOS 60 45 36*  BILITOT 0.5 0.5 0.5  PROT 6.3* 3.9* 4.0*  ALBUMIN 3.0* 1.8* 2.0*   No results for input(s): LIPASE, AMYLASE in the last 168 hours. Recent Labs  Lab 06/21/24 0722  AMMONIA <13    ABG    Component Value Date/Time   PHART 7.01 (LL) 06/21/2024 1835   PCO2ART 47 06/21/2024 1835   PO2ART 66 (L) 06/21/2024 1835   HCO3 11.8 (L) 06/21/2024 1835   TCO2 23 05/27/2024 2135   ACIDBASEDEF 19.3 (H) 06/21/2024 1835   O2SAT 89.4 06/21/2024 1835     Coagulation Profile: Recent Labs  Lab 06/21/24 0722 06/21/24 2138  INR 1.0 1.4*    Cardiac Enzymes: No results for input(s): CKTOTAL, CKMB, CKMBINDEX, TROPONINI in the last 168 hours.  HbA1C: Hgb A1c MFr Bld  Date/Time Value Ref Range Status  05/27/2024 08:11 PM 6.3 (H) 4.8 - 5.6 % Final    Comment:    (NOTE) Diagnosis of Diabetes The following HbA1c ranges recommended by the American Diabetes Association (ADA) may be used as an aid in the diagnosis of diabetes mellitus.  Hemoglobin              Suggested A1C NGSP%              Diagnosis  <5.7                   Non Diabetic  5.7-6.4                Pre-Diabetic  >6.4                   Diabetic  <7.0                   Glycemic control for  adults with diabetes.    03/31/2023 06:40 AM 6.1 (H) 4.8 - 5.6 % Final    Comment:    (NOTE) Pre diabetes:          5.7%-6.4%  Diabetes:              >6.4%  Glycemic control for   <7.0% adults with diabetes     CBG: Recent Labs  Lab 06/21/24 0615 06/21/24 1721 06/21/24 2007 06/21/24 2120 06/21/24 2326  GLUCAP 323* 127* 102* 137* 148*    Review of Systems:   Intubated unable to obtain  Past Medical History:  She,  has a past medical history of Arthritis, Barrett's esophagus with esophagitis (03/26/2013), Chronic abdominal pain, Chronic lower back pain, COPD (chronic obstructive pulmonary disease) (HCC), Depression, Dyspnea, GERD (gastroesophageal reflux disease), H/O acute pancreatitis, Heart murmur, Hiatal hernia, High cholesterol, Hypertension, Peptic ulcer, Pneumonia (2000s X 1), Stroke (HCC), and Tobacco use (03/26/2013).   Surgical History:   Past Surgical History:  Procedure Laterality Date   AORTIC ARCH ANGIOGRAPHY N/A 01/19/2018   Procedure: AORTIC ARCH ANGIOGRAPHY;  Surgeon: Harvey Carlin BRAVO, MD;  Location: [facility];  Service: Cardiovascular;  Laterality: N/A;   BIOPSY  04/17/2017   Procedure: BIOPSY;  Surgeon: Lamar CHRISTELLA Hollingshead, MD;  Location: [facility];  Service: Endoscopy;;  esophageal   CARDIAC CATHETERIZATION     CAROTID-SUBCLAVIAN BYPASS GRAFT Left 03/19/2018   Procedure: BYPASS GRAFT CAROTID-SUBCLAVIAN;  Surgeon: Harvey Carlin BRAVO, MD;  Location: [facility] OR;  Service: Vascular;  Laterality: Left;   CESAREAN SECTION  1985; 1988   COLONOSCOPY WITH PROPOFOL  N/A 04/17/2017   Procedure: COLONOSCOPY WITH PROPOFOL ;  Surgeon: Lamar CHRISTELLA Hollingshead, MD;  [facility]: [facility];  Service: Endoscopy;  Laterality: N/A;  100    ESOPHAGOGASTRODUODENOSCOPY  Oct 2011   Dr. Golda: ulcer in distal esophagus, soft stricture at GE junction s/p balloon dilation PATH: BARRETT'S   ESOPHAGOGASTRODUODENOSCOPY (EGD) WITH ESOPHAGEAL DILATION N/A 03/27/2013   Procedure: ESOPHAGOGASTRODUODENOSCOPY (EGD) WITH ESOPHAGEAL DILATION;  Surgeon: Lamar CHRISTELLA Hollingshead, MD;  [facility];  Service: Endoscopy;  Laterality: N/A;  possible dilation   ESOPHAGOGASTRODUODENOSCOPY (EGD) WITH PROPOFOL  N/A 01/02/2017   Procedure: ESOPHAGOGASTRODUODENOSCOPY (EGD) WITH PROPOFOL ;  Surgeon: Lamar CHRISTELLA Hollingshead, MD;  [facility];  Service: Endoscopy;  Laterality: N/A;  with possible esophageal dilation   ESOPHAGOGASTRODUODENOSCOPY (EGD) WITH PROPOFOL  N/A 04/17/2017   Procedure: ESOPHAGOGASTRODUODENOSCOPY (EGD) WITH PROPOFOL ;  Surgeon: Lamar CHRISTELLA Hollingshead, MD;  [facility];  Service: Endoscopy;  Laterality: N/A;   EUS N/A 05/09/2013   Procedure: UPPER ENDOSCOPIC ULTRASOUND (EUS) LINEAR;  Surgeon: Toribio SHAUNNA Cedar, MD;  Location: [facility];  Service: Endoscopy;  Laterality: N/A;   FRACTURE SURGERY     pt. denies   IR ANGIO INTRA EXTRACRAN SEL COM CAROTID INNOMINATE BILAT MOD SED  09/06/2022   IR ANGIO INTRA EXTRACRAN SEL COM CAROTID INNOMINATE BILAT MOD SED  09/13/2022   IR ANGIO VERTEBRAL SEL SUBCLAVIAN INNOMINATE BILAT MOD SED  09/13/2022   IR ANGIO VERTEBRAL SEL SUBCLAVIAN INNOMINATE UNI R MOD SED  09/06/2022   IR ANGIOGRAM FOLLOW UP STUDY  09/13/2022   IR CT HEAD LTD  09/08/2022   IR CT HEAD LTD  05/27/2024   IR CT HEAD LTD  05/27/2024   IR NEURO EACH ADD'L AFTER BASIC UNI LEFT (MS)  09/13/2022   IR PATIENT EVAL TECH 0-60 MINS  05/28/2024   IR PERCUTANEOUS ART THROMBECTOMY/INFUSION INTRACRANIAL INC DIAG ANGIO  05/27/2024  IR TRANSCATH/EMBOLIZ  09/08/2022   IR US  GUIDE VASC ACCESS RIGHT  09/08/2022   IR US  GUIDE VASC ACCESS RIGHT  09/06/2022   KNEE ARTHROSCOPY Right    LAPAROSCOPIC CHOLECYSTECTOMY     LEFT HEART CATH AND CORONARY ANGIOGRAPHY N/A  02/26/2018   Procedure: LEFT HEART CATH AND CORONARY ANGIOGRAPHY;  Surgeon: Verlin Lonni BIRCH, MD;  [facility];  Service: Cardiovascular;  Laterality: N/A;   MALONEY DILATION N/A 04/17/2017   Procedure: AGAPITO DILATION;  Surgeon: Lamar CHRISTELLA Hollingshead, MD;  [facility];  Service: Endoscopy;  Laterality: N/A;   PATELLA FRACTURE SURGERY Left    POLYPECTOMY  04/17/2017   Procedure: POLYPECTOMY;  Surgeon: Lamar CHRISTELLA Hollingshead, MD;  [facility];  Service: Endoscopy;;   RADIOLOGY WITH ANESTHESIA N/A 09/08/2022   Procedure: L ICA Aneurysm Embolazation;  Surgeon: Dolphus Carrion, MD;  Location: [facility];  Service: Radiology;  Laterality: N/A;   RADIOLOGY WITH ANESTHESIA N/A 05/27/2024   Procedure: RADIOLOGY WITH ANESTHESIA;  Surgeon: Radiologist, Medication, MD;  Location: [facility];  Service: Radiology;  Laterality: N/A;   TEE WITHOUT CARDIOVERSION N/A 01/23/2019   Procedure: TRANSESOPHAGEAL ECHOCARDIOGRAM (TEE);  Surgeon: Jeffrie Oneil BROCKS, MD;  Location: [facility] ENDOSCOPY;  Service: Cardiovascular;  Laterality: N/A;   TUBAL LIGATION  1992   UPPER EXTREMITY ANGIOGRAPHY N/A 01/19/2018   Procedure: UPPER EXTREMITY ANGIOGRAPHY;  Surgeon: Harvey Carlin BRAVO, MD;  Location: [facility];  Service: Cardiovascular;  Laterality: N/A;     Social History:   reports that she has been smoking cigarettes. She started smoking about 41 years ago. She has a 20.9 pack-year smoking history. She has never used smokeless tobacco. She reports that she does not drink alcohol  and does not use drugs.   Family History:  Her family history includes Asthma in her mother; Cancer in her mother; Diabetes in her father and mother; Heart failure in her father and mother; Hypertension in her mother; Pancreatic cancer in her mother; Stroke in her mother. There is no history of Colon cancer.   Allergies No Known Allergies   Home Medications  Prior to Admission medications   Medication Sig Start Date End Date  Taking? Authorizing Provider  atorvastatin  (LIPITOR ) 80 MG tablet Take 1 tablet (80 mg total) by mouth daily. 06/16/24  Yes Johnson, Clanford L, MD  feeding supplement (ENSURE PLUS HIGH PROTEIN) LIQD Take 237 mLs by mouth 2 (two) times daily between meals for 14 days. 06/16/24 06/30/24 Yes Johnson, Clanford L, MD  ipratropium-albuterol  (DUONEB) 0.5-2.5 (3) MG/3ML SOLN Take 3 mLs by nebulization every 4 (four) hours as needed (shortness of breath, wheezing). 06/16/24  Yes Johnson, Clanford L, MD  metFORMIN  (GLUCOPHAGE -XR) 500 MG 24 hr tablet Take 1 tablet (500 mg total) by mouth daily with supper. 06/16/24  Yes Vicci Afton CROME, MD     Critical care time: 29   . The patient is critically ill with multiple organ system failure and requires high complexity decision making for assessment and support, frequent evaluation and titration of therapies, advanced monitoring, review of radiographic studies and interpretation of complex data.   Critical Care Time devoted to patient care services, exclusive of separately billable procedures, described in this note is 35 minutes.   Orlin Fairly MD [facility] See Amion for pager  If no response to pager , please call [phone] until 7pm After 7:00 pm call Elink  [phone] 06/21/2024, 11:34 PM

## 2024-06-21 NOTE — ED Notes (Signed)
 Pt in ct on monitor with RN

## 2024-06-21 NOTE — ED Notes (Signed)
 Md notified of breath sounds, OK, for third liter of fluid.

## 2024-06-21 NOTE — ED Notes (Signed)
 Unable to get second set of blood cultures at this time, starting abx per md instructions.

## 2024-06-21 NOTE — Progress Notes (Signed)
 eLink Physician-Brief Progress Note Patient Name: Rita [name] DOB: 1966-04-27 MRN: [id number]   Date of Service  06/21/2024  HPI/Events of Note  Patient with recent hospitalization for CVA now admitted with altered mental status and suspected septic shock, she arrested in the ED and required cardiopulmonary resuscitation. Patient also has imaging evidence of extensive hydropneumothorax and an entero-colitis.  eICU Interventions  New Patient Evaluation.        Makih Stefanko U Raeann Offner 06/21/2024, 10:13 PM

## 2024-06-22 ENCOUNTER — Inpatient Hospital Stay (HOSPITAL_COMMUNITY)

## 2024-06-22 DIAGNOSIS — J9602 Acute respiratory failure with hypercapnia: Secondary | ICD-10-CM

## 2024-06-22 DIAGNOSIS — A419 Sepsis, unspecified organism: Secondary | ICD-10-CM | POA: Diagnosis not present

## 2024-06-22 DIAGNOSIS — J189 Pneumonia, unspecified organism: Secondary | ICD-10-CM

## 2024-06-22 DIAGNOSIS — R6521 Severe sepsis with septic shock: Secondary | ICD-10-CM

## 2024-06-22 DIAGNOSIS — E8729 Other acidosis: Secondary | ICD-10-CM | POA: Diagnosis not present

## 2024-06-22 DIAGNOSIS — R6520 Severe sepsis without septic shock: Secondary | ICD-10-CM | POA: Diagnosis not present

## 2024-06-22 DIAGNOSIS — K922 Gastrointestinal hemorrhage, unspecified: Secondary | ICD-10-CM

## 2024-06-22 DIAGNOSIS — I469 Cardiac arrest, cause unspecified: Secondary | ICD-10-CM

## 2024-06-22 DIAGNOSIS — I69320 Aphasia following cerebral infarction: Secondary | ICD-10-CM

## 2024-06-22 DIAGNOSIS — G4089 Other seizures: Secondary | ICD-10-CM

## 2024-06-22 DIAGNOSIS — J90 Pleural effusion, not elsewhere classified: Secondary | ICD-10-CM

## 2024-06-22 DIAGNOSIS — J939 Pneumothorax, unspecified: Secondary | ICD-10-CM

## 2024-06-22 DIAGNOSIS — N179 Acute kidney failure, unspecified: Secondary | ICD-10-CM

## 2024-06-22 DIAGNOSIS — J9601 Acute respiratory failure with hypoxia: Secondary | ICD-10-CM | POA: Diagnosis not present

## 2024-06-22 DIAGNOSIS — K560 Paralytic ileus: Secondary | ICD-10-CM

## 2024-06-22 DIAGNOSIS — D62 Acute posthemorrhagic anemia: Secondary | ICD-10-CM

## 2024-06-22 DIAGNOSIS — I69392 Facial weakness following cerebral infarction: Secondary | ICD-10-CM

## 2024-06-22 DIAGNOSIS — G9349 Other encephalopathy: Secondary | ICD-10-CM

## 2024-06-22 LAB — BASIC METABOLIC PANEL WITH GFR
Anion gap: 17 — ABNORMAL HIGH (ref 5–15)
BUN: 82 mg/dL — ABNORMAL HIGH (ref 6–20)
CO2: 16 mmol/L — ABNORMAL LOW (ref 22–32)
Calcium: 7 mg/dL — ABNORMAL LOW (ref 8.9–10.3)
Chloride: 105 mmol/L (ref 98–111)
Creatinine, Ser: 3.18 mg/dL — ABNORMAL HIGH (ref 0.44–1.00)
GFR, Estimated: 16 mL/min — ABNORMAL LOW (ref >60)
Glucose, Bld: 135 mg/dL — ABNORMAL HIGH (ref 70–99)
Potassium: 5 mmol/L (ref 3.5–5.1)
Sodium: 138 mmol/L (ref 135–145)

## 2024-06-22 LAB — BLOOD CULTURE ID PANEL (REFLEXED) - BCID2

## 2024-06-22 LAB — POCT I-STAT EG7
Acid-base deficit: 9 mmol/L — ABNORMAL HIGH (ref 0.0–2.0)
Acid-base deficit: 9 mmol/L — ABNORMAL HIGH (ref 0.0–2.0)
Bicarbonate: 16.4 mmol/L — ABNORMAL LOW (ref 20.0–28.0)
Bicarbonate: 18.2 mmol/L — ABNORMAL LOW (ref 20.0–28.0)
Calcium, Ion: 1.02 mmol/L — ABNORMAL LOW (ref 1.15–1.40)
Calcium, Ion: 0.92 mmol/L — ABNORMAL LOW (ref 1.15–1.40)
HCT: 32 % — ABNORMAL LOW (ref 36.0–46.0)
HCT: 31 % — ABNORMAL LOW (ref 36.0–46.0)
Hemoglobin: 10.9 g/dL — ABNORMAL LOW (ref 12.0–15.0)
Hemoglobin: 10.5 g/dL — ABNORMAL LOW (ref 12.0–15.0)
O2 Saturation: 79 %
O2 Saturation: 28 %
Patient temperature: 37.6
Patient temperature: 97.6
Potassium: 4.8 mmol/L (ref 3.5–5.1)
Potassium: 5.4 mmol/L — ABNORMAL HIGH (ref 3.5–5.1)
Sodium: 135 mmol/L (ref 135–145)
Sodium: 136 mmol/L (ref 135–145)
TCO2: 17 mmol/L — ABNORMAL LOW (ref 22–32)
TCO2: 20 mmol/L — ABNORMAL LOW (ref 22–32)
pCO2, Ven: 32.2 mmHg — ABNORMAL LOW (ref 44–60)
pCO2, Ven: 43.6 mmHg — ABNORMAL LOW (ref 44–60)
pH, Ven: 7.319 (ref 7.25–7.43)
pH, Ven: 7.225 — ABNORMAL LOW (ref 7.25–7.43)
pO2, Ven: 48 mmHg — ABNORMAL HIGH (ref 32–45)
pO2, Ven: 21 mmHg — CL (ref 32–45)

## 2024-06-22 LAB — BPAM RBC
Blood Product Expiration Date: 202507152359
Blood Product Expiration Date: 202507192359
ISSUE DATE / TIME: 202506271628
ISSUE DATE / TIME: 202506271816
Unit Type and Rh: 5100
Unit Type and Rh: 5100

## 2024-06-22 LAB — GASTROINTESTINAL PANEL BY PCR, STOOL (REPLACES STOOL CULTURE)

## 2024-06-22 LAB — COMPREHENSIVE METABOLIC PANEL WITH GFR
ALT: 137 U/L — ABNORMAL HIGH (ref 0–44)
AST: 175 U/L — ABNORMAL HIGH (ref 15–41)
Albumin: 1.9 g/dL — ABNORMAL LOW (ref 3.5–5.0)
Alkaline Phosphatase: 40 U/L (ref 38–126)
Anion gap: 17 — ABNORMAL HIGH (ref 5–15)
BUN: 80 mg/dL — ABNORMAL HIGH (ref 6–20)
CO2: 18 mmol/L — ABNORMAL LOW (ref 22–32)
Calcium: 6.2 mg/dL — CL (ref 8.9–10.3)
Chloride: 102 mmol/L (ref 98–111)
Creatinine, Ser: 2.99 mg/dL — ABNORMAL HIGH (ref 0.44–1.00)
GFR, Estimated: 18 mL/min — ABNORMAL LOW (ref >60)
Glucose, Bld: 205 mg/dL — ABNORMAL HIGH (ref 70–99)
Potassium: 5.2 mmol/L — ABNORMAL HIGH (ref 3.5–5.1)
Sodium: 137 mmol/L (ref 135–145)
Total Bilirubin: 0.9 mg/dL (ref 0.0–1.2)
Total Protein: 4 g/dL — ABNORMAL LOW (ref 6.5–8.1)

## 2024-06-22 LAB — GLUCOSE, CAPILLARY
Glucose-Capillary: 184 mg/dL — ABNORMAL HIGH (ref 70–99)
Glucose-Capillary: 149 mg/dL — ABNORMAL HIGH (ref 70–99)
Glucose-Capillary: 163 mg/dL — ABNORMAL HIGH (ref 70–99)
Glucose-Capillary: 150 mg/dL — ABNORMAL HIGH (ref 70–99)
Glucose-Capillary: 141 mg/dL — ABNORMAL HIGH (ref 70–99)
Glucose-Capillary: 156 mg/dL — ABNORMAL HIGH (ref 70–99)

## 2024-06-22 LAB — CBC
HCT: 31.4 % — ABNORMAL LOW (ref 36.0–46.0)
Hemoglobin: 10.7 g/dL — ABNORMAL LOW (ref 12.0–15.0)
MCH: 30.7 pg (ref 26.0–34.0)
MCHC: 34.1 g/dL (ref 30.0–36.0)
MCV: 90 fL (ref 80.0–100.0)
Platelets: 457 10*3/uL — ABNORMAL HIGH (ref 150–400)
RBC: 3.49 MIL/uL — ABNORMAL LOW (ref 3.87–5.11)
RDW: 15.6 % — ABNORMAL HIGH (ref 11.5–15.5)
WBC: 35.7 10*3/uL — ABNORMAL HIGH (ref 4.0–10.5)
nRBC: 0.1 % (ref 0.0–0.2)

## 2024-06-22 LAB — TYPE AND SCREEN
ABO/RH(D): O POS
Antibody Screen: NEGATIVE
Unit division: 0
Unit division: 0

## 2024-06-22 LAB — PROTIME-INR
INR: 1.4 — ABNORMAL HIGH (ref 0.8–1.2)
Prothrombin Time: 17.9 s — ABNORMAL HIGH (ref 11.4–15.2)

## 2024-06-22 LAB — MAGNESIUM: Magnesium: 2.6 mg/dL — ABNORMAL HIGH (ref 1.7–2.4)

## 2024-06-22 LAB — TRIGLYCERIDES
Triglycerides: 433 mg/dL — ABNORMAL HIGH (ref <150)
Triglycerides: 429 mg/dL — ABNORMAL HIGH (ref <150)

## 2024-06-22 LAB — APTT: aPTT: 29 s (ref 24–36)

## 2024-06-22 LAB — PHOSPHORUS: Phosphorus: 4.5 mg/dL (ref 2.5–4.6)

## 2024-06-22 MED ORDER — IPRATROPIUM-ALBUTEROL 0.5-2.5 (3) MG/3ML IN SOLN: 3.0000 mL | RESPIRATORY_TRACT | Status: DC | PRN

## 2024-06-22 MED ORDER — INSULIN ASPART 100 UNIT/ML IJ SOLN
0.0000 [IU] | INTRAMUSCULAR | Status: DC
  Administered 2024-06-22 (×3): 2 [IU] via SUBCUTANEOUS
  Administered 2024-06-22 – 2024-06-23 (×5): 1 [IU] via SUBCUTANEOUS
  Administered 2024-06-23: 2 [IU] via SUBCUTANEOUS
  Administered 2024-06-23: 3 [IU] via SUBCUTANEOUS
  Administered 2024-06-23 (×2): 2 [IU] via SUBCUTANEOUS
  Administered 2024-06-23 – 2024-06-24 (×3): 1 [IU] via SUBCUTANEOUS
  Administered 2024-06-24: 2 [IU] via SUBCUTANEOUS
  Administered 2024-06-24 – 2024-06-25 (×5): 1 [IU] via SUBCUTANEOUS

## 2024-06-22 MED ORDER — LEVETIRACETAM (KEPPRA) 500 MG/5 ML ADULT IV PUSH
1000.0000 mg | Freq: Two times a day (BID) | INTRAVENOUS | Status: DC
  Administered 2024-06-22 – 2024-06-26 (×9): 1000 mg via INTRAVENOUS
  Filled 2024-06-22 (×9): qty 10

## 2024-06-22 MED ORDER — METRONIDAZOLE 500 MG/100ML IV SOLN: 500.0000 mg | Freq: Two times a day (BID) | INTRAVENOUS | Status: DC

## 2024-06-22 MED ORDER — SODIUM CHLORIDE 0.9 % IV SOLN
2.0000 g | Freq: Every day | INTRAVENOUS | Status: DC
  Administered 2024-06-22: 2 g via INTRAVENOUS
  Filled 2024-06-22: qty 12.5

## 2024-06-22 MED ORDER — SODIUM BICARBONATE 8.4 % IV SOLN
INTRAVENOUS | Status: AC
  Filled 2024-06-22: qty 50

## 2024-06-22 MED ORDER — MAGNESIUM SULFATE 4 GM/100ML IV SOLN
4.0000 g | Freq: Once | INTRAVENOUS | Status: AC
  Administered 2024-06-22: 4 g via INTRAVENOUS
  Filled 2024-06-22: qty 100

## 2024-06-22 MED ORDER — MIDAZOLAM BOLUS VIA INFUSION
10.0000 mg | INTRAVENOUS | Status: DC | PRN
  Administered 2024-06-22 – 2024-06-24 (×7): 10 mg via INTRAVENOUS

## 2024-06-22 MED ORDER — MIDAZOLAM HCL 2 MG/2ML IJ SOLN: 10.0000 mg | INTRAMUSCULAR | Status: DC | PRN

## 2024-06-22 MED ORDER — MIDAZOLAM HCL 2 MG/2ML IJ SOLN
2.0000 mg | INTRAMUSCULAR | Status: DC | PRN
  Administered 2024-06-22 (×2): 2 mg via INTRAVENOUS
  Filled 2024-06-22 (×2): qty 2

## 2024-06-22 MED ORDER — HEPARIN SODIUM (PORCINE) 5000 UNIT/ML IJ SOLN
5000.0000 [IU] | Freq: Three times a day (TID) | INTRAMUSCULAR | Status: DC
  Administered 2024-06-22 – 2024-06-25 (×9): 5000 [IU] via SUBCUTANEOUS
  Filled 2024-06-22 (×9): qty 1

## 2024-06-22 MED ORDER — VANCOMYCIN HCL 1500 MG/300ML IV SOLN
1500.0000 mg | INTRAVENOUS | Status: DC
  Filled 2024-06-22: qty 300

## 2024-06-22 MED ORDER — CALCIUM GLUCONATE-NACL 2-0.675 GM/100ML-% IV SOLN
2.0000 g | Freq: Once | INTRAVENOUS | Status: AC
  Administered 2024-06-22: 2000 mg via INTRAVENOUS
  Filled 2024-06-22: qty 100

## 2024-06-22 MED ORDER — MIDAZOLAM-SODIUM CHLORIDE 100-0.9 MG/100ML-% IV SOLN
10.0000 mg/h | INTRAVENOUS | Status: DC
  Administered 2024-06-22: 10 mg/h via INTRAVENOUS
  Administered 2024-06-23 (×2): 40 mg/h via INTRAVENOUS
  Administered 2024-06-23: 10 mg/h via INTRAVENOUS
  Administered 2024-06-23 (×5): 40 mg/h via INTRAVENOUS
  Filled 2024-06-22 (×2): qty 100
  Filled 2024-06-22: qty 400
  Filled 2024-06-22 (×3): qty 100
  Filled 2024-06-22: qty 300
  Filled 2024-06-22 (×2): qty 100

## 2024-06-22 MED ORDER — SODIUM BICARBONATE 8.4 % IV SOLN
100.0000 meq | Freq: Once | INTRAVENOUS | Status: AC
  Administered 2024-06-22: 100 meq via INTRAVENOUS
  Filled 2024-06-22: qty 50

## 2024-06-22 MED ORDER — MIDAZOLAM HCL 2 MG/2ML IJ SOLN
INTRAMUSCULAR | Status: AC
  Filled 2024-06-22: qty 2

## 2024-06-22 MED ORDER — PIPERACILLIN-TAZOBACTAM 3.375 G IVPB
3.3750 g | Freq: Three times a day (TID) | INTRAVENOUS | Status: DC
  Administered 2024-06-22 – 2024-06-26 (×10): 3.375 g via INTRAVENOUS
  Filled 2024-06-22 (×11): qty 50

## 2024-06-22 NOTE — Progress Notes (Signed)
 EEG reviewed, worsening myoclonic discharges   - Keppra 500 mg BID is max for her current renal function - Propofol  currently at 100 with concern for rising triglycerides; limited room here  - Add Midazolam  typically 0.2 mg/kg loading dose, 0.1 - 2 mg/kg/hr maintenance; given hepatic dysfunction, will use 10 mg boluses for now start at 10 mg/hr maintenance, noting potential to accumulate  Lola Jernigan MD-PhD [facility] [phone] Available 7 PM to 7 AM, outside of these hours please call Neurologist on call as listed on Amion.   CRITICAL CARE Performed by: Lola LITTIE Jernigan   Total critical care time: 10 minutes  Critical care time was exclusive of separately billable procedures and treating other patients.  Critical care was necessary to treat or prevent imminent or life-threatening deterioration.  Critical care was time spent personally by me on the following activities: development of treatment plan with patient and/or surrogate as well as nursing, discussions with consultants, evaluation of patient's response to treatment, examination of patient, obtaining history from patient or surrogate, ordering and performing treatments and interventions, ordering and review of laboratory studies, ordering and review of radiographic studies, pulse oximetry and re-evaluation of patient's condition.

## 2024-06-22 NOTE — Plan of Care (Signed)
  Problem: Clinical Measurements: Goal: Ability to maintain clinical measurements within normal limits will improve Outcome: Not Progressing Goal: Respiratory complications will improve Outcome: Not Progressing Goal: Cardiovascular complication will be avoided Outcome: Not Progressing

## 2024-06-22 NOTE — Progress Notes (Signed)
 Same-day progress note Please see initial consult note by Dr. Jerrie from this morning. Patient seen and examined in the to her ICU Remains on sedation with propofol  which was held for the exam Brief subtle myoclonic twitching of full body noted off of propofol . Pupils equal, round, sluggishly reactive to light Sluggish corneal reflexes Intact cough and gag No purposeful withdrawal to noxious stimulation-noxious stimulation only induces myoclonus  Patient with seizure activity status post PEA arrest-multiple comorbidities including GI bleed, recent large left MCA stroke with residual aphasia and right hemiparesis, acute blood loss anemia due to the GI bleed, possible septic shock, bilateral pleural effusions and right hydropneumothorax, acute kidney injury-likely going to be a poor prognosis from a neurologically meaningful recovery standpoint.  Will recommend ongoing supportive care per primary team and as provided in the initial neurology consult but would encourage GOC discussions early on with the family for them to understand the extremely poor chances of any neurological meaningful recovery given all of her comorbidities.  Plan discussed with Dr. Candyce Eligio Lav, MD Neurology

## 2024-06-22 NOTE — Progress Notes (Signed)
 MB performed maintenance on A2, F4, T8 electrodes. All impedances are below 10k ohms. No skin breakdown noted.

## 2024-06-22 NOTE — IPAL (Signed)
 Interdisciplinary Goals of Care Family Meeting   Date carried out:: 06/22/2024  Location of the meeting: Bedside  Member's involved: Physician, Bedside Registered Nurse, and Family Member or next of kin  Durable Power of Attorney or acting medical decision maker: Rita [name]  Discussion: We discussed goals of care for Rita [name] .    The Clinical status was relayed to daughter at bedside  in detail.   Updated and notified of patients medical condition.   Patient remains unresponsive and will not open eyes to command, having myoclonic seizures. Signs of brain damage Evidence of kidney failure   Patient with Progressive multiorgan failure with a very high probablity of a very minimal chance of meaningful recovery despite all aggressive and optimal medical therapy.  Code status: Full DNR  Disposition: Continue current acute care    Family are satisfied with Plan of action and management. All questions answered   Rita Novas MD [facility] See Amion for pager If no response to pager, please call [phone] until 7pm After 7pm, Please call E-link [phone]

## 2024-06-22 NOTE — Progress Notes (Signed)
     Palliative Care consult requested for goals of care discussion in this 58 y.o. female  with past medical history of HLD, HTN, PAD, cocaine and THC abuse, Barrett's esophagitis (2014), chronic back pain, COPD, GERD, depression, CVA, and pancreatitis. Recently hospitalized and discharged to SNF on 6/11 s/p CVA requiring TNK and IR thrombectomy. She was admitted on 06/21/2024 to [facility] via EMS from home after reports of shortness of breath and AMS per family. She went unresponsive for approximately 20 min prior to EMS arrival. During work-up patient was alert and responsive. Tachycardic, tachypnea, and hypothermic, subsequently code sepsis initiated. CT chest showed moderate right hydropneumothorax,  Extensive pneumomediastinum with gas extending into the neck, retroperitoneum, and peritoneum. Suspected fractures in the left 5th-7th ribs and the anterior right 4th-9th ribs. CT abd consistent with paralytic ileus-and enterocolitis--with heme positive stools. Patient suffered PEA arrest with ROSC and intubation. Patient transferred to [facility] ICU on 06/21/24.   Referral received for Rita [name] : goals of care discussion. Chart reviewed and updates received from RN. Patient assessed and is unable to engage appropriately in discussions. Significant Other at bedside emotional. Daughter has left.   Family recently had goals of care discussions with Dr. Harold 30 min prior to my visit. Code status changed to DNR.   PMT will re-attempt to contact family at a later time/date for ongoing goals of care discussion. Allowing needed time to process information and not feel overwhelmed with multiple discussions. Detailed note and recommendations to follow once GOC has been completed.   Thank you for your referral and allowing PMT to assist in Rita [name]'s care.   Levon Borer, AGPCNP-BC Palliative Medicine Team  Phone: [phone] Pager: [phone] Amion: N. Cousar   NO  CHARGE

## 2024-06-22 NOTE — Progress Notes (Signed)
 eLink Physician-Brief Progress Note Patient Name: Rita [name] DOB: [birth year] MRN: [id number]   Date of Service  06/22/2024  HPI/Events of Note  Mg++ 1.2  eICU Interventions  Magnesium  sulfate 4 gm iv x 1 ordered.        Taber Sweetser U Baker Kogler 06/22/2024, 12:50 AM

## 2024-06-22 NOTE — Consult Note (Signed)
 NEUROLOGY CONSULT NOTE   Date of service: June 22, 2024 Patient Name: Rita [name] MRN:  [id number] DOB:  09-21-1966 Chief Complaint: Concern for seizures s/p cardiac arrest Requesting Provider: Maree Harder, MD  History of Present Illness  Rita [name] is a 58 y.o. female with hx of recent left MCA stroke status post thrombectomy, peripheral artery disease, cocaine and THC abuse, hypertension, hyperlipidemia  Per primary team notes found unresponsive with diarrhea and abdomen imaging demonstrating paralytic ileus and enterocolitis with heme positive stools, became bradycardic to the 20s, and then developed V-fib/V. tach after atropine x 2 and then had PEA with CPR initiated, downtime is not clearly documented likely 6-8 min total.  She was recently discharged after left MCA stroke with residual global aphasia, but minimal weakness  Subsequently developed myoclonic activity for which neurology was consulted    ROS  Unable to assess secondary to patient's mental status   Past History   Past Medical History:  Diagnosis Date   Arthritis    qwhere (02/22/2018)   Barrett's esophagus with esophagitis 03/26/2013   Confirmed via biopsy in 2011.  Subsequent EGD in 2014 and 2018 with biopsies consistent with reflux.  No evidence of Barrett's at that time.   Chronic abdominal pain    Chronic lower back pain    COPD (chronic obstructive pulmonary disease) (HCC)    Depression    Dyspnea    GERD (gastroesophageal reflux disease)    H/O acute pancreatitis    Heart murmur    Hiatal hernia    High cholesterol    Hypertension    Peptic ulcer    Pneumonia 2000s X 1   Stroke (HCC)    Tobacco use 03/26/2013    Past Surgical History:  Procedure Laterality Date   AORTIC ARCH ANGIOGRAPHY N/A 01/19/2018   Procedure: AORTIC ARCH ANGIOGRAPHY;  Surgeon: Harvey Carlin BRAVO, MD;  Location: [facility];  Service: Cardiovascular;  Laterality: N/A;   BIOPSY  04/17/2017   Procedure: BIOPSY;   Surgeon: Lamar CHRISTELLA Hollingshead, MD;  Location: [facility];  Service: Endoscopy;;  esophageal   CARDIAC CATHETERIZATION     CAROTID-SUBCLAVIAN BYPASS GRAFT Left 03/19/2018   Procedure: BYPASS GRAFT CAROTID-SUBCLAVIAN;  Surgeon: Harvey Carlin BRAVO, MD;  Location: [facility] OR;  Service: Vascular;  Laterality: Left;   CESAREAN SECTION  1985; 1988   COLONOSCOPY WITH PROPOFOL  N/A 04/17/2017   Procedure: COLONOSCOPY WITH PROPOFOL ;  Surgeon: Lamar CHRISTELLA Hollingshead, MD;  Location: [facility];  Service: Endoscopy;  Laterality: N/A;  100   ESOPHAGOGASTRODUODENOSCOPY  Oct 2011   Dr. Golda: ulcer in distal esophagus, soft stricture at GE junction s/p balloon dilation PATH: BARRETT'S   ESOPHAGOGASTRODUODENOSCOPY (EGD) WITH ESOPHAGEAL DILATION N/A 03/27/2013   Procedure: ESOPHAGOGASTRODUODENOSCOPY (EGD) WITH ESOPHAGEAL DILATION;  Surgeon: Lamar CHRISTELLA Hollingshead, MD;  [facility]: [facility];  Service: Endoscopy;  Laterality: N/A;  possible dilation   ESOPHAGOGASTRODUODENOSCOPY (EGD) WITH PROPOFOL  N/A 01/02/2017   Procedure: ESOPHAGOGASTRODUODENOSCOPY (EGD) WITH PROPOFOL ;  Surgeon: Lamar CHRISTELLA Hollingshead, MD;  [facility]: [facility];  Service: Endoscopy;  Laterality: N/A;  with possible esophageal dilation   ESOPHAGOGASTRODUODENOSCOPY (EGD) WITH PROPOFOL  N/A 04/17/2017   Procedure: ESOPHAGOGASTRODUODENOSCOPY (EGD) WITH PROPOFOL ;  Surgeon: Lamar CHRISTELLA Hollingshead, MD;  [facility]: [facility];  Service: Endoscopy;  Laterality: N/A;   EUS N/A 05/09/2013   Procedure: UPPER ENDOSCOPIC ULTRASOUND (EUS) LINEAR;  Surgeon: Toribio SHAUNNA Cedar, MD;  Location: [facility];  Service: Endoscopy;  Laterality: N/A;   FRACTURE SURGERY  pt. denies   IR ANGIO INTRA EXTRACRAN SEL COM CAROTID INNOMINATE BILAT MOD SED  09/06/2022   IR ANGIO INTRA EXTRACRAN SEL COM CAROTID INNOMINATE BILAT MOD SED  09/13/2022   IR ANGIO VERTEBRAL SEL SUBCLAVIAN INNOMINATE BILAT MOD SED  09/13/2022   IR ANGIO VERTEBRAL SEL SUBCLAVIAN INNOMINATE UNI R MOD SED  09/06/2022   IR ANGIOGRAM FOLLOW UP  STUDY  09/13/2022   IR CT HEAD LTD  09/08/2022   IR CT HEAD LTD  05/27/2024   IR CT HEAD LTD  05/27/2024   IR NEURO EACH ADD'L AFTER BASIC UNI LEFT (MS)  09/13/2022   IR PATIENT EVAL TECH 0-60 MINS  05/28/2024   IR PERCUTANEOUS ART THROMBECTOMY/INFUSION INTRACRANIAL INC DIAG ANGIO  05/27/2024   IR TRANSCATH/EMBOLIZ  09/08/2022   IR US  GUIDE VASC ACCESS RIGHT  09/08/2022   IR US  GUIDE VASC ACCESS RIGHT  09/06/2022   KNEE ARTHROSCOPY Right    LAPAROSCOPIC CHOLECYSTECTOMY     LEFT HEART CATH AND CORONARY ANGIOGRAPHY N/A 02/26/2018   Procedure: LEFT HEART CATH AND CORONARY ANGIOGRAPHY;  Surgeon: Verlin Lonni BIRCH, MD;  [facility]: [facility];  Service: Cardiovascular;  Laterality: N/A;   MALONEY DILATION N/A 04/17/2017   Procedure: AGAPITO DILATION;  Surgeon: Lamar CHRISTELLA Hollingshead, MD;  [facility]: [facility];  Service: Endoscopy;  Laterality: N/A;   PATELLA FRACTURE SURGERY Left    POLYPECTOMY  04/17/2017   Procedure: POLYPECTOMY;  Surgeon: Lamar CHRISTELLA Hollingshead, MD;  [facility]: [facility];  Service: Endoscopy;;   RADIOLOGY WITH ANESTHESIA N/A 09/08/2022   Procedure: L ICA Aneurysm Embolazation;  Surgeon: Dolphus Carrion, MD;  Location: [facility];  Service: Radiology;  Laterality: N/A;   RADIOLOGY WITH ANESTHESIA N/A 05/27/2024   Procedure: RADIOLOGY WITH ANESTHESIA;  Surgeon: Radiologist, Medication, MD;  Location: [facility];  Service: Radiology;  Laterality: N/A;   TEE WITHOUT CARDIOVERSION N/A 01/23/2019   Procedure: TRANSESOPHAGEAL ECHOCARDIOGRAM (TEE);  Surgeon: Jeffrie Oneil BROCKS, MD;  Location: [facility];  Service: Cardiovascular;  Laterality: N/A;   TUBAL LIGATION  1992   UPPER EXTREMITY ANGIOGRAPHY N/A 01/19/2018   Procedure: UPPER EXTREMITY ANGIOGRAPHY;  Surgeon: Harvey Carlin BRAVO, MD;  Location: [facility];  Service: Cardiovascular;  Laterality: N/A;    Family History: Family History  Problem Relation Age of Onset   Asthma Mother    Heart failure Mother    Cancer Mother        pancreatic    Diabetes Mother    Hypertension Mother    Stroke Mother    Pancreatic cancer Mother        deceased   Heart failure Father    Diabetes Father    Colon cancer Neg Hx     Social History  reports that she has been smoking cigarettes. She started smoking about 41 years ago. She has a 20.9 pack-year smoking history. She has never used smokeless tobacco. She reports that she does not drink alcohol  and does not use drugs.  No Known Allergies  Medications   Current Facility-Administered Medications:    0.9 %  sodium chloride  infusion (Manually program via Guardrails IV Fluids), , Intravenous, Once, Emokpae, Courage, MD   0.9 %  sodium chloride  infusion, , Intravenous, Continuous, Emokpae, Courage, MD, Last Rate: 150 mL/hr at 06/22/24 0455, Infusion Verify at 06/22/24 0455   0.9 %  sodium chloride  infusion, 250 mL, Intravenous, Continuous, Emokpae, Courage, MD, Stopped at 06/21/24 2342   0.9 %  sodium chloride  infusion, , Intravenous,  PRN, Emokpae, Courage, MD   acetaminophen  (TYLENOL ) tablet 650 mg, 650 mg, Oral, Q6H PRN **OR** acetaminophen  (TYLENOL ) suppository 650 mg, 650 mg, Rectal, Q6H PRN, Emokpae, Courage, MD   albuterol  (VENTOLIN  HFA) 108 (90 Base) MCG/ACT inhaler 4 puff, 4 puff, Inhalation, QID, Emokpae, Courage, MD   atorvastatin  (LIPITOR ) tablet 80 mg, 80 mg, Per Tube, Daily, Hunsucker, Donnice SAUNDERS, MD   azithromycin (ZITHROMAX) 500 mg in sodium chloride  0.9 % 250 mL IVPB, 500 mg, Intravenous, Q24H, Emokpae, Courage, MD, Stopped at 06/21/24 1242   bisacodyl  (DULCOLAX) suppository 10 mg, 10 mg, Rectal, Daily PRN, Emokpae, Courage, MD   ceFEPIme (MAXIPIME) 2 g in sodium chloride  0.9 % 100 mL IVPB, 2 g, Intravenous, Daily, Maree Harder, MD   Chlorhexidine  Gluconate Cloth 2 % PADS 6 each, 6 each, Topical, Q0600, Emokpae, Courage, MD, 6 each at 06/22/24 0511   feeding supplement (ENSURE PLUS HIGH PROTEIN) liquid 237 mL, 237 mL, Oral, TID, Emokpae, Courage, MD   fentaNYL  (SUBLIMAZE )  injection 50 mcg, 50 mcg, Intravenous, Q15 min PRN, Emokpae, Courage, MD, 50 mcg at 06/21/24 1839   fentaNYL  (SUBLIMAZE ) injection 50-200 mcg, 50-200 mcg, Intravenous, Q30 min PRN, Emokpae, Courage, MD, 50 mcg at 06/22/24 0154   fentaNYL  in NS (7mcg/ml) infusion-PREMIX, 0-400 mcg/hr, Intravenous, Continuous, Maree Harder, MD, Last Rate: 5 mL/hr at 06/22/24 0455, 50 mcg/hr at 06/22/24 0455   heparin  injection 5,000 Units, 5,000 Units, Subcutaneous, Q8H, Maree Harder, MD   insulin  aspart (novoLOG ) injection 0-9 Units, 0-9 Units, Subcutaneous, Q4H, Maree Harder, MD, 2 Units at 06/22/24 0324   levETIRAcetam (KEPPRA) undiluted injection 1,000 mg, 1,000 mg, Intravenous, BID, Ogan, Okoronkwo U, MD, 1,000 mg at 06/22/24 0343   metroNIDAZOLE  (FLAGYL ) IVPB 500 mg, 500 mg, Intravenous, Q12H, Emokpae, Courage, MD, Paused at 06/22/24 0043   midazolam  (VERSED ) injection 2 mg, 2 mg, Intravenous, Q5 min PRN, Ogan, Okoronkwo U, MD, 2 mg at 06/22/24 0340   norepinephrine (LEVOPHED) 4mg  in (0.016 mg/mL) premix infusion, 0-40 mcg/min, Intravenous, Titrated, Lenon Elsie HERO, [facility], Last Rate: 30 mL/hr at 06/22/24 0455, 8 mcg/min at 06/22/24 0455   ondansetron  (ZOFRAN ) tablet 4 mg, 4 mg, Oral, Q6H PRN **OR** ondansetron  (ZOFRAN ) injection 4 mg, 4 mg, Intravenous, Q6H PRN, Emokpae, Courage, MD   Oral care mouth rinse, 15 mL, Mouth Rinse, Q2H, Hunsucker, Donnice SAUNDERS, MD, 15 mL at 06/22/24 0511   Oral care mouth rinse, 15 mL, Mouth Rinse, PRN, Hunsucker, Donnice SAUNDERS, MD   pantoprazole  (PROTONIX ) injection 40 mg, 40 mg, Intravenous, Q12H, Emokpae, Courage, MD, 40 mg at 06/21/24 1708   polyethylene glycol (MIRALAX  / GLYCOLAX ) packet 17 g, 17 g, Oral, Daily PRN, Emokpae, Courage, MD   propofol  (DIPRIVAN ) 1000 MG/100ML infusion, 0-80 mcg/kg/min, Intravenous, Continuous, Emokpae, Courage, MD, Last Rate: 17.75 mL/hr at 06/22/24 0455, 30 mcg/kg/min at 06/22/24 0455   sodium bicarbonate 75 mEq in sodium chloride   0.45 % 1,075 mL infusion, , Intravenous, Continuous, Stinson, Jacob J, DO, Last Rate: 125 mL/hr at 06/22/24 0455, Infusion Verify at 06/22/24 0455   sodium chloride  flush (NS) 0.9 % injection 3 mL, 3 mL, Intravenous, Q12H, Emokpae, Courage, MD, 3 mL at 06/21/24 2122   sodium chloride  flush (NS) 0.9 % injection 3 mL, 3 mL, Intravenous, Q12H, Emokpae, Courage, MD, 3 mL at 06/21/24 2122   sodium chloride  flush (NS) 0.9 % injection 3 mL, 3 mL, Intravenous, PRN, Emokpae, Courage, MD   [START ON 06/23/2024] vancomycin  (VANCOREADY) IVPB 1500 mg/300 mL, 1,500 mg, Intravenous, Q48H, Maree,  Orlin, MD  Vitals   Vitals:   06/22/24 9549 06/22/24 0451 06/22/24 0452 06/22/24 0500  BP: 114/68     Pulse: 95 94 95   Resp: (!) 33 (!) 32 (!) 35   Temp: 98.8 F (37.1 C) 98.8 F (37.1 C) 98.8 F (37.1 C)   TempSrc:      SpO2: 99% 99% 100%   Weight:    102.5 kg  Height:        Body mass index is 35.39 kg/m.   Physical Exam   Physical Exam  Constitutional: Appears ill Psych: Minimally interactive Eyes: Scleral edema is absent HENT: ET tube in place MSK: no major joint deformities.  Cardiovascular: Normal rate and regular rhythm.  Respiratory: Breathing comfortably on the ventilator, breathing over the ventilator set rate  Neuro: Mental Status: Does not open eyes spontaneously, to voice or noxious stimulation Does not follow any commands Cranial Nerves: II: No blink to threat. Pupils are equal, round, and reactive to light.   III,IV, VI/VIII: No eye movement on VOR testing V/VII: Intact to saline to the eyes  VIII: No response to voice X/XI: Intact cough on deep suction  XII: Unable to assess tongue protrusion secondary to patient's mental status  Motor/Sensory: Tone is normal. Bulk is normal.  No movement to noxious stim in any of the extremities other than slight myoclonic jerking Deep Tendon Reflexes: Absent throughout  Plantars: Toes are mute bilaterally.  Cerebellar: Unable to  assess secondary to patient's mental status    Labs/Imaging/Neurodiagnostic studies   CBC:  Recent Labs  Lab 07-04-2024 0408 06/21/24 0722 06/21/24 1539 06/21/24 2138 06/21/24 2319  WBC 12.7* 34.8* 27.6* 33.1*  --   NEUTROABS 7.9* 29.8*  --   --   --   HGB 7.9* 11.8* 6.9* 10.9* 10.9*  HCT 23.6* 36.1 22.3* 31.7* 32.0*  MCV 90.8 90.5 96.5 90.1  --   PLT 439* 911* 548* 455*  --    Basic Metabolic Panel:  Lab Results  Component Value Date   NA 135 06/21/2024   K 4.8 06/21/2024   CO2 18 (L) 06/21/2024   GLUCOSE 249 (H) 06/21/2024   BUN 76 (H) 06/21/2024   CREATININE 2.59 (H) 06/21/2024   CALCIUM  6.4 (LL) 06/21/2024   GFRNONAA 21 (L) 06/21/2024   GFRAA >60 01/24/2019   Lipid Panel:  Lab Results  Component Value Date   LDLCALC UNABLE TO CALCULATE IF TRIGLYCERIDE OVER 400 mg/dL 93/96/7974   YhaJ8r:  Lab Results  Component Value Date   HGBA1C 6.3 (H) 05/27/2024   Urine Drug Screen:     Component Value Date/Time   LABOPIA NONE DETECTED 06/13/2024 0247   COCAINSCRNUR NONE DETECTED 06/13/2024 0247   LABBENZ NONE DETECTED 06/13/2024 0247   AMPHETMU NONE DETECTED 06/13/2024 0247   THCU NONE DETECTED 06/13/2024 0247   LABBARB NONE DETECTED 06/13/2024 0247    Alcohol  Level     Component Value Date/Time   ETH <15 06/21/2024 0722   INR  Lab Results  Component Value Date   INR 1.4 (H) 06/21/2024   APTT  Lab Results  Component Value Date   APTT 27 06/21/2024   AED levels: No results found for: PHENYTOIN, ZONISAMIDE, LAMOTRIGINE, LEVETIRACETA  CT Head without contrast(Personally reviewed): No CT evidence of acute intracranial abnormality. Evolving subacute to chronic left MCA territory infarct corresponding to findings on prior MRI. Additional remote left PCA territory and watershed infarcts and remote infarcts in the cerebellum. Chronic microvascular ischemic changes.   ASSESSMENT  MESHAWN OCONNOR is a 58 y.o. female with concern for myoclonic seizure  activity after PEA arrest at approximately 5 AM on 6/27; formal neuro prognostication would not be until after 5 AM on 6/30.   On exam does have majority of brainstem function intact   Very challenging situation here given already recovering from significant stroke, now with infection, sepsis GI bleed and likely secondary brain injury from cardiac arrest.   Multiple medical comorbidities including: - Recent large left MCA stroke with residual global aphasia, decreased blink to threat on the right, mild right facial droop and mild right upper extremity weakness, only able to mimic simple commands on discharge 6 10/2024 - Paralytic ileus/enterocolitis and concern for pneumonia - Septic shock - Bilateral pleural effusions and right hydropneumothorax s/p chest tube placement - Acute kidney injury - GI bleeding s/p 2 units of blood  RECOMMENDATIONS  - cEEG monitoring - if cEEG confirms seizures, consider alternative to cefepime if possible due to this lowering seizure threshold - Keppra 1000 mg load, then 500 mg BID. Adjust as needed for renal function:  Estimated Creatinine Clearance: 29.2 mL/min (A) (by C-G formula based on SCr of 2.59 mg/dL (H)).   CrCl 30 to <50 mL/minute/1.73 m2: 250 to 750 mg every 12 hours. - Appreciate ongoing goals of care discussions and management of comorbidities per primary team - High risk of stent reocclusion without antiplatelet agents, restart when medically able  - Neurology will follow along ______________________________________________________________________   Lola Jernigan MD-PhD [facility] [phone]  CRITICAL CARE Performed by: Lola LITTIE Jernigan   Total critical care time: 40 minutes  Critical care time was exclusive of separately billable procedures and treating other patients.  Critical care was necessary to treat or prevent imminent or life-threatening deterioration.  Critical care was time spent personally by me on the  following activities: development of treatment plan with patient and/or surrogate as well as nursing, discussions with consultants, evaluation of patient's response to treatment, examination of patient, obtaining history from patient or surrogate, ordering and performing treatments and interventions, ordering and review of laboratory studies, ordering and review of radiographic studies, pulse oximetry and re-evaluation of patient's condition.

## 2024-06-22 NOTE — Progress Notes (Signed)
 LTM EEG hooked up and running - no initial skin breakdown - push button tested - [facility] monitoring.

## 2024-06-22 NOTE — Progress Notes (Signed)
 PHARMACY - PHYSICIAN COMMUNICATION CRITICAL VALUE ALERT - BLOOD CULTURE IDENTIFICATION (BCID)  Rita [name] is an 58 y.o. female who presented to [facility] on 06/21/2024 with ventilator dependent respiratory failure, sepsis, and pneumonia.  Assessment:  Blood cultures containing methicillin resistant staphylococcus epidermidis. Discussed with MD, unlikely the cause of her illness. Patient with ventilator dependent pneumonia with emphysema on CXR. Will continue current antibiotics at this time.  Name of physician (or Provider) Contacted: Dr. Harold  Current antibiotics: Vancomycin  + Zosyn  Changes to prescribed antibiotics recommended:  Recommendations declined by provider due to multiple possible sources of infection  Results for orders placed or performed during the hospital encounter of 06/21/24  Blood Culture ID Panel (Reflexed) (Collected: 06/21/2024  7:22 AM)  Result Value Ref Range   Enterococcus faecalis NOT DETECTED NOT DETECTED   Enterococcus Faecium NOT DETECTED NOT DETECTED   Listeria monocytogenes NOT DETECTED NOT DETECTED   Staphylococcus species DETECTED (A) NOT DETECTED   Staphylococcus aureus (BCID) NOT DETECTED NOT DETECTED   Staphylococcus epidermidis DETECTED (A) NOT DETECTED   Staphylococcus lugdunensis NOT DETECTED NOT DETECTED   Streptococcus species NOT DETECTED NOT DETECTED   Streptococcus agalactiae NOT DETECTED NOT DETECTED   Streptococcus pneumoniae NOT DETECTED NOT DETECTED   Streptococcus pyogenes NOT DETECTED NOT DETECTED   A.calcoaceticus-baumannii NOT DETECTED NOT DETECTED   Bacteroides fragilis NOT DETECTED NOT DETECTED   Enterobacterales NOT DETECTED NOT DETECTED   Enterobacter cloacae complex NOT DETECTED NOT DETECTED   Escherichia coli NOT DETECTED NOT DETECTED   Klebsiella aerogenes NOT DETECTED NOT DETECTED   Klebsiella oxytoca NOT DETECTED NOT DETECTED   Klebsiella pneumoniae NOT DETECTED NOT DETECTED   Proteus species NOT DETECTED NOT  DETECTED   Salmonella species NOT DETECTED NOT DETECTED   Serratia marcescens NOT DETECTED NOT DETECTED   Haemophilus influenzae NOT DETECTED NOT DETECTED   Neisseria meningitidis NOT DETECTED NOT DETECTED   Pseudomonas aeruginosa NOT DETECTED NOT DETECTED   Stenotrophomonas maltophilia NOT DETECTED NOT DETECTED   Candida albicans NOT DETECTED NOT DETECTED   Candida auris NOT DETECTED NOT DETECTED   Candida glabrata NOT DETECTED NOT DETECTED   Candida krusei NOT DETECTED NOT DETECTED   Candida parapsilosis NOT DETECTED NOT DETECTED   Candida tropicalis NOT DETECTED NOT DETECTED   Cryptococcus neoformans/gattii NOT DETECTED NOT DETECTED   Methicillin resistance mecA/C DETECTED (A) NOT DETECTED    Morna Breach, PharmD PGY2 Cardiology Pharmacy Resident 06/22/2024 2:44 PM

## 2024-06-22 NOTE — H&P (Signed)
 NAME:  Rita [name], MRN:  [id number], DOB:  31-Jan-1966, LOS: 1 ADMISSION DATE:  06/21/2024, CONSULTATION DATE:  06/21/2024 REFERRING MD:  Pearlean Manus, MD, CHIEF COMPLAINT:  Sepsis  History of Present Illness:  27 yu/o female with PMH for PAD, Cociane and THC abuse, HLD, HTN who recently suffered a left MCA stroke who presented with found unresponsive with diarrhea, CT abd showing paralytic ileus and enterocolitis with with heme positive stools significant abdominal discomfort with abdominal distention and persistent nausea and dry heaving... -NG tube placement attempted unsuccessfully. She had episode of bradycardia and rates in the 20's.  Patient had a weak pulse and received Atropine x 2, but then lost her pulse and possibly had V fib/Vtach and then had PEA.  and CPR initiated and given Epinephrine.  ROCS achieved but she was intubated by Anesthesia.   She was hemoccult positive.  She was ordered for 2 units PRBC. Post code cxr showing a right hydropneumothorax and rib fracture possibly during CPR.   Pertinent  Medical History  Arthritis, Barrett's esophagus with esophagitis (03/26/2013), Chronic abdominal pain, Chronic lower back pain, COPD (chronic obstructive pulmonary disease) (HCC), Depression, Dyspnea, GERD (gastroesophageal reflux disease), H/O acute pancreatitis, Heart murmur, Hiatal hernia, High cholesterol, Hypertension, Peptic ulcer, Pneumonia (2000s X 1), Stroke (HCC), and Tobacco use (03/26/2013).    Significant Hospital Events: Including procedures, antibiotic start and stop dates in addition to other pertinent events   6/27: Admit to ICU  Interim History / Subjective:  Patient is having status myoclonus Remain afebrile Vasopressor requirement is improving C. difficile stool toxin and GI PCR panel is negative  Objective    Blood pressure (!) 91/41, pulse 90, temperature 98.2 F (36.8 C), resp. rate (!) 35, height 5' 7 (1.702 m), weight 102.5 kg, last menstrual  period 02/09/2012, SpO2 94%.    Vent Mode: PRVC FiO2 (%):  [60 %-100 %] 60 % Set Rate:  [18 bmp-24 bmp] 18 bmp Vt Set:  [440 mL-490 mL] 490 mL PEEP:  [5 cmH20] 5 cmH20 Plateau Pressure:  [16 cmH20-20 cmH20] 16 cmH20   Intake/Output Summary (Last 24 hours) at 06/22/2024 1209 Last data filed at 06/22/2024 1100 Gross per 24 hour  Intake 7789.5 ml  Output 2115 ml  Net 5674.5 ml   Filed Weights   06/21/24 1040 06/21/24 2330 06/22/24 0500  Weight: 98.6 kg 99.8 kg 102.5 kg    Examination: General: Crtitically ill-appearing female, orally intubated HEENT: [place]/AT, eyes anicteric.  ETT and cortrak in place Neuro: Eyes closed, does not open, not following commands, having frequent stimulus induced myoclonus Chest: Bilateral basal crackles, no wheezes or rhonchi Heart: Regular rate and rhythm, no murmurs or gallops Abdomen: Soft, nondistended, bowel sounds present  Labs and images reviewed  Patient Lines/Drains/Airways Status     Active Line/Drains/Airways     Name Placement date Placement time Site Days   Peripheral IV 06/21/24 22 G Anterior;Right Forearm 06/21/24  0806  Forearm  1   CVC Triple Lumen 06/21/24 Right Femoral 06/21/24  0718  -- 1   Chest Tube 1 Lateral;Right Pleural 06/21/24  2209  Pleural  1   NG/OG Vented/Dual Lumen 14 Fr. Left nare Marking at nare/corner of mouth 60 cm 06/21/24  2236  Left nare  1   Urethral Catheter Almarie, NT Latex;Temperature probe 14 Fr. 06/21/24  9373  Latex;Temperature probe  1   Fecal Management System 45 mL 06/21/24  1847  -- 1   Airway 7.5 mm 06/21/24  1530  --  1        Resolved problem list   Assessment and Plan  Status post PEA cardiac arrest Status myoclonus Acute encephalopathy, likely hypoxic/anoxic Acute respiratory failure with hypoxia and hypercapnia Severe sepsis with septic shock due to bilateral lower lobe pneumonia, POA Right-sided pneumothorax Bilateral pleural effusion status post right-sided chest tube  placement Acute metabolic acidosis/lactic acidosis Acute kidney injury due to ischemic ATN in the setting of cardiac arrest Hyponatremia/hypocalcemia Acute GI bleeding Acute blood loss anemia Recent history of stroke with right-sided weakness  Continue supportive care Continue TTM Patient is having myoclonus Neurology is following Continue Keppra Continue propofol  Continue nonproductive ventilation VAP prevention bundle in place Bed protocol with propofol  and as needed fentanyl  Vent setting was adjusted to clear hypercapnia Continue to titrate vasopressor support with MAP goal 65 Continue broad-spectrum antibiotics with vancomycin  and Zosyn Avoid cefepime in the setting of AKI leading to low threshold for seizure Chest tube was placed with resolving pneumothorax, she is having dark-colored pleural effusion consistent with parapneumonic effusion/empyema Lactic acidosis has cleared Monitor intake and output Avoid nephrotoxic agent Closely monitor and supplement electrolytes No more episodes of GI bleeding Monitor H&H and transfuse if less than 7 Continue statin, hold aspirin  in the setting of active GI bleeding  Best Practice (right click and Reselect all SmartList Selections daily)   Diet/type: NPO DVT prophylaxis prophylactic heparin   Pressure ulcer(s): Please refer to nursing notes GI prophylaxis: PPI Lines: Right Femoral Central Line Foley: Still needed Code Status: DNR   Labs   CBC: Recent Labs  Lab 06/16/24 0408 06/21/24 0722 06/21/24 1539 06/21/24 2138 06/21/24 2319 06/22/24 0504  WBC 12.7* 34.8* 27.6* 33.1*  --   --   NEUTROABS 7.9* 29.8*  --   --   --   --   HGB 7.9* 11.8* 6.9* 10.9* 10.9* 10.5*  HCT 23.6* 36.1 22.3* 31.7* 32.0* 31.0*  MCV 90.8 90.5 96.5 90.1  --   --   PLT 439* 911* 548* 455*  --   --     Basic Metabolic Panel: Recent Labs  Lab 06/16/24 0408 06/21/24 0722 06/21/24 1539 06/21/24 2138 06/21/24 2319 06/22/24 0431  06/22/24 0504  NA 142 136 140 134* 135  --  136  K 3.1* 3.2* 4.8 4.7 4.8  --  5.4*  CL 106 92* 105 102  --   --   --   CO2 27 18* 14* 18*  --   --   --   GLUCOSE 113* 379* 258* 249*  --   --   --   BUN 19 68* 78* 76*  --   --   --   CREATININE 0.85 2.11* 2.26* 2.59*  --   --   --   CALCIUM  7.9* 8.8* 7.3* 6.4*  --   --   --   MG 1.7  --   --  1.2*  --  2.6*  --   PHOS  --   --  6.5*  --   --  4.5  --    GFR: Estimated Creatinine Clearance: 29.2 mL/min (A) (by C-G formula based on SCr of 2.59 mg/dL (H)). Recent Labs  Lab 06/16/24 0408 06/21/24 0611 06/21/24 0722 06/21/24 0845 06/21/24 1539 06/21/24 2138 06/21/24 2140  WBC 12.7*  --  34.8*  --  27.6* 33.1*  --   LATICACIDVEN  --  8.8*  --  >9.0* >9.0*  --  1.8    Liver Function Tests: Recent Labs  Lab 06/21/24  9277 06/21/24 1539 06/21/24 2138  AST 30 51* 130*  ALT 30 55* 122*  ALKPHOS 60 45 36*  BILITOT 0.5 0.5 0.5  PROT 6.3* 3.9* 4.0*  ALBUMIN 3.0* 1.8* 2.0*   No results for input(s): LIPASE, AMYLASE in the last 168 hours. Recent Labs  Lab 06/21/24 0722  AMMONIA <13    ABG    Component Value Date/Time   PHART 7.01 (LL) 06/21/2024 1835   PCO2ART 47 06/21/2024 1835   PO2ART 66 (L) 06/21/2024 1835   HCO3 18.2 (L) 06/22/2024 0504   TCO2 20 (L) 06/22/2024 0504   ACIDBASEDEF 9.0 (H) 06/22/2024 0504   O2SAT 28 06/22/2024 0504     Coagulation Profile: Recent Labs  Lab 06/21/24 0722 06/21/24 2138  INR 1.0 1.4*    Cardiac Enzymes: No results for input(s): CKTOTAL, CKMB, CKMBINDEX, TROPONINI in the last 168 hours.  HbA1C: Hgb A1c MFr Bld  Date/Time Value Ref Range Status  05/27/2024 08:11 PM 6.3 (H) 4.8 - 5.6 % Final    Comment:    (NOTE) Diagnosis of Diabetes The following HbA1c ranges recommended by the American Diabetes Association (ADA) may be used as an aid in the diagnosis of diabetes mellitus.  Hemoglobin             Suggested A1C NGSP%              Diagnosis  <5.7                    Non Diabetic  5.7-6.4                Pre-Diabetic  >6.4                   Diabetic  <7.0                   Glycemic control for                       adults with diabetes.    03/31/2023 06:40 AM 6.1 (H) 4.8 - 5.6 % Final    Comment:    (NOTE) Pre diabetes:          5.7%-6.4%  Diabetes:              >6.4%  Glycemic control for   <7.0% adults with diabetes     CBG: Recent Labs  Lab 06/21/24 2120 06/21/24 2326 06/22/24 0321 06/22/24 0735 06/22/24 1113  GLUCAP 137* 148* 184* 149* 163*     The patient is critically ill due to severe sepsis with septic shock/acute respiratory failure/cardiac arrest.  Critical care was necessary to treat or prevent imminent or life-threatening deterioration.  Critical care was time spent personally by me on the following activities: development of treatment plan with patient and/or surrogate as well as nursing, discussions with consultants, evaluation of patient's response to treatment, examination of patient, obtaining history from patient or surrogate, ordering and performing treatments and interventions, ordering and review of laboratory studies, ordering and review of radiographic studies, pulse oximetry, re-evaluation of patient's condition and participation in multidisciplinary rounds.   During this encounter critical care time was devoted to patient care services described in this note for 42 minutes.     Valinda Novas, MD [facility] See Amion for pager If no response to pager, please call [phone] until 7pm After 7pm, Please call E-link [phone]

## 2024-06-22 NOTE — Procedures (Signed)
 Insertion of Chest Tube Procedure Note  Rita [name]  [id number]  1966-11-03  Date:06/22/24  Time:12:04 AM    Provider Performing: Orlin Fairly   Procedure: Chest Tube Insertion (32551)  Indication(s) Pneumothorax  Consent Risks of the procedure as well as the alternatives and risks of each were explained to the patient and/or caregiver.  Consent for the procedure was obtained and is signed in the bedside chart  Case d/w with Rita [name] her sister who gave the verbal consent.  Anesthesia Topical only with 1% lidocaine     Time Out Verified patient identification, verified procedure, site/side was marked, verified correct patient position, special equipment/implants available, medications/allergies/relevant history reviewed, required imaging and test results available.   Sterile Technique Maximal sterile technique including full sterile barrier drape, hand hygiene, sterile gown, sterile gloves, mask, hair covering, sterile ultrasound probe cover (if used).   Procedure Description Ultrasound not used to identify appropriate pleural anatomy for placement and overlying skin marked. Area of placement cleaned and draped in sterile fashion.  A 20 French chest tube was placed into the right pleural space using Kelly dissection. Appropriate return of blood was obtained.  The tube was connected to atrium and placed on -20 cm H2O wall suction.   Complications/Tolerance None; patient tolerated the procedure well. Chest X-ray is ordered to verify placement.   EBL Minimal  Specimen(s) none

## 2024-06-22 NOTE — Progress Notes (Signed)
 Pharmacy Antibiotic Note  Rita [name] is a 58 y.o. female admitted on 06/21/2024 with VDRF/sepsis/PNA.  Pharmacy has been consulted for Vancomycin   dosing.  Plan: Vancomycin  1500 mg IV q48h F/U renal function  Height: 5' 7 (170.2 cm) Weight: 99.8 kg (220 lb 0.3 oz) IBW/kg (Calculated) : 61.6  Temp (24hrs), Avg:99.3 F (37.4 C), Min:96.1 F (35.6 C), Max:99.7 F (37.6 C)  Recent Labs  Lab 06/15/24 0258 06/16/24 0408 06/21/24 0611 06/21/24 0722 06/21/24 0845 06/21/24 1539 06/21/24 2138 06/21/24 2140  WBC 14.4* 12.7*  --  34.8*  --  27.6* 33.1*  --   CREATININE 0.89 0.85  --  2.11*  --  2.26* 2.59*  --   LATICACIDVEN  --   --  8.8*  --  >9.0* >9.0*  --  1.8    Estimated Creatinine Clearance: 28.7 mL/min (A) (by C-G formula based on SCr of 2.59 mg/dL (H)).    No Known Allergies  Dail Cordella Misty 06/22/2024 12:30 AM

## 2024-06-22 NOTE — Progress Notes (Signed)
 eLink Physician-Brief Progress Note Patient Name: Rita [name] DOB: Aug 06, 1966 MRN: [id number]   Date of Service  06/22/2024  HPI/Events of Note  Patient with myoclonic seizures s/p cardiac arrest with CPR followed by ROSC.  eICU Interventions  Versed  2 mg iv PRN seizures ordered, cEEG, patient is on Propofol  gtt, Keppra ordered, neurology consulted.        Donalee Gaumond U Adalberto Metzgar 06/22/2024, 3:45 AM

## 2024-06-23 ENCOUNTER — Encounter (HOSPITAL_COMMUNITY)

## 2024-06-23 DIAGNOSIS — G4089 Other seizures: Secondary | ICD-10-CM | POA: Diagnosis not present

## 2024-06-23 DIAGNOSIS — R569 Unspecified convulsions: Secondary | ICD-10-CM

## 2024-06-23 DIAGNOSIS — E8729 Other acidosis: Secondary | ICD-10-CM | POA: Diagnosis not present

## 2024-06-23 DIAGNOSIS — I69320 Aphasia following cerebral infarction: Secondary | ICD-10-CM | POA: Diagnosis not present

## 2024-06-23 DIAGNOSIS — A419 Sepsis, unspecified organism: Secondary | ICD-10-CM | POA: Diagnosis not present

## 2024-06-23 DIAGNOSIS — G931 Anoxic brain damage, not elsewhere classified: Secondary | ICD-10-CM | POA: Diagnosis not present

## 2024-06-23 DIAGNOSIS — R6520 Severe sepsis without septic shock: Secondary | ICD-10-CM | POA: Diagnosis not present

## 2024-06-23 DIAGNOSIS — J9601 Acute respiratory failure with hypoxia: Secondary | ICD-10-CM | POA: Diagnosis not present

## 2024-06-23 DIAGNOSIS — K560 Paralytic ileus: Secondary | ICD-10-CM | POA: Diagnosis not present

## 2024-06-23 LAB — CBC WITH DIFFERENTIAL/PLATELET
Abs Immature Granulocytes: 0.42 10*3/uL — ABNORMAL HIGH (ref 0.00–0.07)
Basophils Absolute: 0.1 10*3/uL (ref 0.0–0.1)
Basophils Relative: 0 %
Eosinophils Absolute: 0 10*3/uL (ref 0.0–0.5)
Eosinophils Relative: 0 %
HCT: 28.9 % — ABNORMAL LOW (ref 36.0–46.0)
Hemoglobin: 10 g/dL — ABNORMAL LOW (ref 12.0–15.0)
Immature Granulocytes: 1 %
Lymphocytes Relative: 4 %
Lymphs Abs: 1.6 10*3/uL (ref 0.7–4.0)
MCH: 32.1 pg (ref 26.0–34.0)
MCHC: 34.6 g/dL (ref 30.0–36.0)
MCV: 92.6 fL (ref 80.0–100.0)
Monocytes Absolute: 1.8 10*3/uL — ABNORMAL HIGH (ref 0.1–1.0)
Monocytes Relative: 5 %
Neutro Abs: 33.6 10*3/uL — ABNORMAL HIGH (ref 1.7–7.7)
Neutrophils Relative %: 90 %
Platelets: 404 10*3/uL — ABNORMAL HIGH (ref 150–400)
RBC: 3.12 MIL/uL — ABNORMAL LOW (ref 3.87–5.11)
RDW: 16.1 % — ABNORMAL HIGH (ref 11.5–15.5)
WBC Morphology: INCREASED
WBC: 37.6 10*3/uL — ABNORMAL HIGH (ref 4.0–10.5)
nRBC: 0.1 % (ref 0.0–0.2)

## 2024-06-23 LAB — HEPATIC FUNCTION PANEL
ALT: 172 U/L — ABNORMAL HIGH (ref 0–44)
AST: 145 U/L — ABNORMAL HIGH (ref 15–41)
Albumin: 1.6 g/dL — ABNORMAL LOW (ref 3.5–5.0)
Alkaline Phosphatase: 63 U/L (ref 38–126)
Bilirubin, Direct: <0.1 mg/dL (ref 0.0–0.2)
Total Bilirubin: 0.4 mg/dL (ref 0.0–1.2)
Total Protein: 4.3 g/dL — ABNORMAL LOW (ref 6.5–8.1)

## 2024-06-23 LAB — RENAL FUNCTION PANEL
Albumin: 1.6 g/dL — ABNORMAL LOW (ref 3.5–5.0)
Anion gap: 18 — ABNORMAL HIGH (ref 5–15)
BUN: 81 mg/dL — ABNORMAL HIGH (ref 6–20)
CO2: 18 mmol/L — ABNORMAL LOW (ref 22–32)
Calcium: 7.2 mg/dL — ABNORMAL LOW (ref 8.9–10.3)
Chloride: 97 mmol/L — ABNORMAL LOW (ref 98–111)
Creatinine, Ser: 3.74 mg/dL — ABNORMAL HIGH (ref 0.44–1.00)
GFR, Estimated: 13 mL/min — ABNORMAL LOW (ref >60)
Glucose, Bld: 205 mg/dL — ABNORMAL HIGH (ref 70–99)
Phosphorus: 7.3 mg/dL — ABNORMAL HIGH (ref 2.5–4.6)
Potassium: 4.4 mmol/L (ref 3.5–5.1)
Sodium: 133 mmol/L — ABNORMAL LOW (ref 135–145)

## 2024-06-23 LAB — GLUCOSE, CAPILLARY
Glucose-Capillary: 212 mg/dL — ABNORMAL HIGH (ref 70–99)
Glucose-Capillary: 194 mg/dL — ABNORMAL HIGH (ref 70–99)
Glucose-Capillary: 168 mg/dL — ABNORMAL HIGH (ref 70–99)
Glucose-Capillary: 153 mg/dL — ABNORMAL HIGH (ref 70–99)
Glucose-Capillary: 172 mg/dL — ABNORMAL HIGH (ref 70–99)
Glucose-Capillary: 138 mg/dL — ABNORMAL HIGH (ref 70–99)
Glucose-Capillary: 146 mg/dL — ABNORMAL HIGH (ref 70–99)

## 2024-06-23 LAB — TRIGLYCERIDES: Triglycerides: 1183 mg/dL — ABNORMAL HIGH (ref <150)

## 2024-06-23 LAB — MAGNESIUM: Magnesium: 2.4 mg/dL (ref 1.7–2.4)

## 2024-06-23 LAB — VANCOMYCIN, RANDOM: Vancomycin Rm: 17 ug/mL

## 2024-06-23 MED ORDER — SODIUM CHLORIDE 0.9 % IV SOLN
10.0000 mg/h | INTRAVENOUS | Status: DC
  Administered 2024-06-24 – 2024-06-26 (×5): 40 mg/h via INTRAVENOUS
  Filled 2024-06-23 (×6): qty 100

## 2024-06-23 MED ORDER — VANCOMYCIN HCL IN DEXTROSE 1-5 GM/200ML-% IV SOLN
1000.0000 mg | Freq: Once | INTRAVENOUS | Status: AC
  Administered 2024-06-23: 1000 mg via INTRAVENOUS
  Filled 2024-06-23: qty 200

## 2024-06-23 MED ORDER — VANCOMYCIN VARIABLE DOSE PER UNSTABLE RENAL FUNCTION (PHARMACIST DOSING): Status: DC

## 2024-06-23 MED ORDER — FUROSEMIDE 10 MG/ML IJ SOLN
80.0000 mg | Freq: Once | INTRAMUSCULAR | Status: AC
  Administered 2024-06-23: 80 mg via INTRAVENOUS
  Filled 2024-06-23: qty 8

## 2024-06-23 MED ORDER — NOREPINEPHRINE 16 MG/250ML-% IV SOLN
0.0000 ug/min | INTRAVENOUS | Status: DC
  Administered 2024-06-23: 34 ug/min via INTRAVENOUS
  Administered 2024-06-23: 40 ug/min via INTRAVENOUS
  Administered 2024-06-23: 34 ug/min via INTRAVENOUS
  Administered 2024-06-24: 28 ug/min via INTRAVENOUS
  Administered 2024-06-24: 32 ug/min via INTRAVENOUS
  Administered 2024-06-25: 17 ug/min via INTRAVENOUS
  Administered 2024-06-25: 24 ug/min via INTRAVENOUS
  Filled 2024-06-23 (×7): qty 250

## 2024-06-23 MED ORDER — VASOPRESSIN 20 UNITS/100 ML INFUSION FOR SHOCK
0.0000 [IU]/min | INTRAVENOUS | Status: DC
  Administered 2024-06-23 – 2024-06-26 (×7): 0.03 [IU]/min via INTRAVENOUS
  Filled 2024-06-23 (×7): qty 100

## 2024-06-23 NOTE — Progress Notes (Signed)
 NEUROLOGY CONSULT FOLLOW UP NOTE   Date of service: June 23, 2024 Patient Name: Rita [name] MRN:  [id number] DOB:  23-Jun-1966  Interval Hx/subjective  Seen and examined Triglycerides elevated overnight.  Propofol  was reduced.  Versed  drip was added.  Vitals   Vitals:   06/23/24 0945 06/23/24 1000 06/23/24 1015 06/23/24 1030  BP: (!) 117/56 (!) 110/58 (!) 105/48   Pulse:  89    Resp: (!) 30 (!) 30 (!) 30 (!) 30  Temp: (!) 97.2 F (36.2 C) (!) 97.2 F (36.2 C) (!) 97.3 F (36.3 C) (!) 97.3 F (36.3 C)  TempSrc:      SpO2:  97%    Weight:      Height:         Body mass index is 35.7 kg/m.  Physical Exam  General: Sedated intubated HEENT: Normocephalic atraumatic Lungs: Vented Neurological exam Breathing over the ventilator No response to voice Sedated intubated Propofol  held for exam Continues to be on Versed  drip Cranial nerves: Pupils are equal very sluggishly reactive, absent corneal reflexes, absent VOR, gaze midline Motor examination with no spontaneous movement.  Upon holding the propofol , there was some right upper extremity myoclonic twitching that was observed.  No withdrawal to noxious stimulation Sensory exam: As above  Medications  Current Facility-Administered Medications:    0.9 %  sodium chloride  infusion (Manually program via Guardrails IV Fluids), , Intravenous, Once, Emokpae, Courage, MD   acetaminophen  (TYLENOL ) tablet 650 mg, 650 mg, Oral, Q6H PRN **OR** acetaminophen  (TYLENOL ) suppository 650 mg, 650 mg, Rectal, Q6H PRN, Emokpae, Courage, MD   atorvastatin  (LIPITOR ) tablet 80 mg, 80 mg, Per Tube, Daily, Hunsucker, Donnice SAUNDERS, MD, 80 mg at 06/23/24 0956   bisacodyl  (DULCOLAX) suppository 10 mg, 10 mg, Rectal, Daily PRN, Emokpae, Courage, MD   Chlorhexidine  Gluconate Cloth 2 % PADS 6 each, 6 each, Topical, Q0600, Emokpae, Courage, MD, 6 each at 06/23/24 0526   feeding supplement (ENSURE PLUS HIGH PROTEIN) liquid 237 mL, 237 mL, Oral, TID, Emokpae,  Courage, MD, 237 mL at 06/22/24 2125   fentaNYL  (SUBLIMAZE ) injection 50-200 mcg, 50-200 mcg, Intravenous, Q30 min PRN, Emokpae, Courage, MD, 100 mcg at 06/22/24 2003   fentaNYL  in NS (20mcg/ml) infusion-PREMIX, 0-400 mcg/hr, Intravenous, Continuous, Maree Harder, MD, Last Rate: 7.5 mL/hr at 06/23/24 0820, 75 mcg/hr at 06/23/24 0820   heparin  injection 5,000 Units, 5,000 Units, Subcutaneous, Q8H, Maree Harder, MD, 5,000 Units at 06/23/24 9472   insulin  aspart (novoLOG ) injection 0-9 Units, 0-9 Units, Subcutaneous, Q4H, Maree Harder, MD, 2 Units at 06/23/24 0750   ipratropium-albuterol  (DUONEB) 0.5-2.5 (3) MG/3ML nebulizer solution 3 mL, 3 mL, Nebulization, Q4H PRN, Maree Harder, MD   levETIRAcetam (KEPPRA) undiluted injection 1,000 mg, 1,000 mg, Intravenous, BID, Ogan, Okoronkwo U, MD, 1,000 mg at 06/23/24 0956   midazolam  (VERSED ) 100 mg/100 mL (1 mg/mL) premix infusion, 10-60 mg/hr, Intravenous, Continuous, Bhagat, Srishti L, MD, Last Rate: 40 mL/hr at 06/23/24 0901, 40 mg/hr at 06/23/24 0901   midazolam  (VERSED ) bolus via infusion 10 mg, 10 mg, Intravenous, Q5 min PRN, Bhagat, Srishti L, MD, 10 mg at 06/23/24 0653   norepinephrine (LEVOPHED) 16 mg in 250mL (0.064 mg/mL) premix infusion, 0-40 mcg/min, Intravenous, Titrated, Chand, Sudham, MD, Last Rate: 37.5 mL/hr at 06/23/24 0931, 40 mcg/min at 06/23/24 0931   ondansetron  (ZOFRAN ) tablet 4 mg, 4 mg, Oral, Q6H PRN **OR** ondansetron  (ZOFRAN ) injection 4 mg, 4 mg, Intravenous, Q6H PRN, Emokpae, Courage, MD   Oral care mouth rinse,  15 mL, Mouth Rinse, Q2H, Hunsucker, Donnice SAUNDERS, MD, 15 mL at 06/23/24 0957   Oral care mouth rinse, 15 mL, Mouth Rinse, PRN, Hunsucker, Donnice SAUNDERS, MD   pantoprazole  (PROTONIX ) injection 40 mg, 40 mg, Intravenous, Q12H, Emokpae, Courage, MD, 40 mg at 06/23/24 0956   piperacillin-tazobactam (ZOSYN) IVPB 3.375 g, 3.375 g, Intravenous, Q8H, Chand, Valinda, MD, Last Rate: 12.5 mL/hr at 06/23/24 0820, Infusion Verify  at 06/23/24 0820   polyethylene glycol (MIRALAX  / GLYCOLAX ) packet 17 g, 17 g, Oral, Daily PRN, Pearlean, Courage, MD   propofol  (DIPRIVAN ) 1000 MG/100ML infusion, 0-100 mcg/kg/min, Intravenous, Continuous, Harold Valinda, MD, Stopped at 06/23/24 0817   sodium chloride  flush (NS) 0.9 % injection 3 mL, 3 mL, Intravenous, Q12H, Emokpae, Courage, MD, 3 mL at 06/23/24 0957   sodium chloride  flush (NS) 0.9 % injection 3 mL, 3 mL, Intravenous, Q12H, Emokpae, Courage, MD, 3 mL at 06/23/24 0957   sodium chloride  flush (NS) 0.9 % injection 3 mL, 3 mL, Intravenous, PRN, Emokpae, Courage, MD   vancomycin  variable dose per unstable renal function (pharmacist dosing), , Does not apply, See admin instructions, Serena Morna SAUNDERS, [facility]   CBC:  Recent Labs  Lab 06/21/24 0722 06/21/24 1539 06/21/24 2138 06/21/24 2319 06/22/24 0504 06/23/24 0331  WBC 34.8*   < > 33.1*  --   --  37.6*  NEUTROABS 29.8*  --   --   --   --  33.6*  HGB 11.8*   < > 10.9*   < > 10.5* 10.0*  HCT 36.1   < > 31.7*   < > 31.0* 28.9*  MCV 90.5   < > 90.1  --   --  92.6  PLT 911*   < > 455*  --   --  404*   < > = values in this interval not displayed.    Basic Metabolic Panel:  Lab Results  Component Value Date   NA 133 (L) 06/23/2024   K 4.4 06/23/2024   CO2 18 (L) 06/23/2024   GLUCOSE 205 (H) 06/23/2024   BUN 81 (H) 06/23/2024   CREATININE 3.74 (H) 06/23/2024   CALCIUM  7.2 (L) 06/23/2024   GFRNONAA 13 (L) 06/23/2024   GFRAA >60 01/24/2019   Lipid Panel:  Lab Results  Component Value Date   LDLCALC UNABLE TO CALCULATE IF TRIGLYCERIDE OVER 400 mg/dL 93/96/7974   YhaJ8r:  Lab Results  Component Value Date   HGBA1C 6.3 (H) 05/27/2024   Urine Drug Screen:     Component Value Date/Time   LABOPIA NONE DETECTED 06/13/2024 0247   COCAINSCRNUR NONE DETECTED 06/13/2024 0247   LABBENZ NONE DETECTED 06/13/2024 0247   AMPHETMU NONE DETECTED 06/13/2024 0247   THCU NONE DETECTED 06/13/2024  0247   LABBARB NONE DETECTED 06/13/2024 0247    Alcohol  Level     Component Value Date/Time   ETH <15 06/21/2024 0722   INR  Lab Results  Component Value Date   INR 1.4 (H) 06/21/2024   APTT  Lab Results  Component Value Date   APTT 27 06/21/2024     CT Head without contrast(Personally reviewed): Evolving subacute to chronic left MCA territory infarct corresponding to findings on prior MRI.  Additional remote left PCA territory and watershed infarct and remote infarcts in the cerebellum.  cEEG: Myoclonic seizure, generalized, spikes which are also generalized and burst suppression generalized.  At the beginning of the study, patient was noted to have episodes of brief sudden whole body  jerking consistent with myoclonic seizure.  Gradually as medications were adjusted after around 2130 hrs. on 06/22/2024, EEG showed evidence of epileptogenicity with generalized onset as well as profound diffuse encephalopathy.   Assessment   TENNYSON KALLEN is a 58 y.o. female past history of a recent left MCA territory infarct status post EVT and stent placement in the MCA, peripheral artery disease, cocaine and THC abuse, hypertension, hyperlipidemia, found unresponsive with diarrhea and abdominal imaging with paralytic ileus and enterocolitis with heme positive stools, became bradycardic and then developed V-fib/V. tach and then had a PEA arrest with CPR initiation-unclear downtime-could be 6 to 8 minutes.  Myoclonic jerking noted after the cardiac arrest for which neurology was consulted.  Started on continuous EEG after being given Keppra load and started on propofol  drip.  Overnight, triglycerides elevated for which propofol  dose had to be reduced and Versed  drip had to be started. Exam remains very poor In the setting of her recent large left MCA stroke, septic shock from paralytic ileus/enterocolitis and also concern for pneumonia and acute kidney injury L as well as GI bleeding, chances for  neurological meaningful recovery are grim at this time  Impression:  Anoxic brain injury Recent large left MCA stroke with global aphasia Paralytic ileus/enterocolitis and concern for pneumonia causing septic shock Acute kidney injury GI bleeding  Recommendations  Management of the infectious process and GI bleed as well as kidney function per primary team. Continue Keppra 500 twice daily which is max for her renal function. Continue current doses of Versed  and Keppra. If she continues to have myoclonic seizure activity, consider adding Depakote followed by Klonopin. More on formal prognostication tomorrow morning but with the amount of comorbidities and the current clinical picture, chances for neurological meaningful recovery appear grim. Discussed discussed with the critical care team, Dr. Harold at bedside ______________________________________________________________________   Signed, Eligio Lav, MD [facility]  CRITICAL CARE ATTESTATION Performed by: Eligio Lav, MD Total critical care time: 36 minutes Critical care time was exclusive of separately billable procedures and treating other patients and/or supervising APPs/Residents/Students Critical care was necessary to treat or prevent imminent or life-threatening deterioration. This patient is critically ill and at significant risk for neurological worsening and/or death and care requires constant monitoring. Critical care was time spent personally by me on the following activities: development of treatment plan with patient and/or surrogate as well as nursing, discussions with consultants, evaluation of patient's response to treatment, examination of patient, obtaining history from patient or surrogate, ordering and performing treatments and interventions, ordering and review of laboratory studies, ordering and review of radiographic studies, pulse oximetry, re-evaluation of patient's condition, participation in  multidisciplinary rounds and medical decision making of high complexity in the care of this patient.

## 2024-06-23 NOTE — Procedures (Addendum)
 Patient Name: Rita [name]  MRN: [id number]  Epilepsy Attending: Arlin MALVA Krebs  Referring Physician/Provider: Ogan, Okoronkwo U, MD  Duration: 06/22/2024 0654 to 06/23/2024 0654  Patient history:  58 y.o. female with concern for myoclonic seizure activity after PEA arrest at approximately 5 AM on 6/27. EEG to evaluate for seizure.  Level of alertness:  comatose  AEDs during EEG study: Propofol , Versed   Technical aspects: This EEG study was done with scalp electrodes positioned according to the 10-20 International system of electrode placement. Electrical activity was reviewed with band pass filter of 1-70Hz , sensitivity of 7 uV/mm, display speed of 43mm/sec with a 60Hz  notched filter applied as appropriate. EEG data were recorded continuously and digitally stored.  Video monitoring was available and reviewed as appropriate.  Description: EEG initially showed continuous generalized 5 to 6 Hz theta slowing. Generalized spikes were noted at times periodic at 2-3hz . Intermittently, patient was noted to have episodes of brief sudden whole body jerking. Concomitant EEG showed generalized spikes consistent with myoclonic seizures. Around 2130 on 06/22/2024, EEG showed burst suppression with burst of 3-6hz  theta-delta slowing and generalized spikes lasting 3-4 seconds admixed with generalized suppression lasting 4-12 seconds. Hyperventilation and photic stimulation were not performed.     ABNORMALITY - Myoclonic seizure, generalized - Spikes, generalized - Burst suppression, generalized  IMPRESSION: At the beginning of the study, patient was noted to have episodes of brief sudden whole body jerking consistent with myoclonic seizure. Gradually as medications were adjusted after around 2130 on 06/22/2024, EEG showed evidence of epileptogenicity with generalized onset as well as profound diffuse encephalopathy.  Tayveon Lombardo O Hayde Kilgour

## 2024-06-23 NOTE — Progress Notes (Addendum)
 Pharmacy Antibiotic Note  Rita [name] is a 58 y.o. female admitted on 06/21/2024 with VDRF/sepsis/PNA.  Pharmacy has been consulted for Vancomycin  dosing.  6/29: Scr increased to 3.74 today, vancomycin  random checked at 0958 today (~50 hours after vancomycin  2000 mg IV x1 on 06/23/24). Vancomycin  level was 17, within trough goal of 15-20. Will administer another dose of vancomycin  today. Noted patient is growing MRSE in 2/4 bottles, discussed with MD and not de-escalating due to other possible causes of infection.  Plan: Vancomycin  1000 mg IV x1 - consider rechecking vancomycin  random level Tuesday morning Continue Zosyn 3.375 gm IV every 8 hours F/U renal function and clinical progression  Height: 5' 7 (170.2 cm) Weight: 103.4 kg (227 lb 15.3 oz) IBW/kg (Calculated) : 61.6  Temp (24hrs), Avg:98.2 F (36.8 C), Min:96.8 F (36 C), Max:99.9 F (37.7 C)  Recent Labs  Lab 06/21/24 0611 06/21/24 0722 06/21/24 0845 06/21/24 1539 06/21/24 2138 06/21/24 2140 06/22/24 0925 06/23/24 0331 06/23/24 0958  WBC  --  34.8*  --  27.6* 33.1*  --   --  37.6*  --   CREATININE  --  2.11*  --  2.26* 2.59*  --  3.18* 3.74*  --   LATICACIDVEN 8.8*  --  >9.0* >9.0*  --  1.8  --   --   --   VANCORANDOM  --   --   --   --   --   --   --   --  17    Estimated Creatinine Clearance: 20.3 mL/min (A) (by C-G formula based on SCr of 3.74 mg/dL (H)).    No Known Allergies  Antibiotics this admission: Azithromycin 6/27 > 6/28 Cefepime 6/27 > 6/28 Ceftriaxone  6/27 x1 Vancomycin  6/27 >> Zosyn 6/28 >>  Blood cultures this admission: 6/27 RVP: negative 6/27 C. Diff: negative 6/27 MRSA: negative 6/28 Bcx: GPCs in 2/4 bottles - MRSE  Morna Breach, PharmD PGY2 Cardiology Pharmacy Resident 06/23/2024 1:57 PM

## 2024-06-23 NOTE — Progress Notes (Signed)
 NAME:  Rita [name], MRN:  [id number], DOB:  [birth year], LOS: 2 ADMISSION DATE:  06/21/2024, CONSULTATION DATE:  06/21/2024 REFERRING MD:  Pearlean Manus, MD, CHIEF COMPLAINT:  Sepsis  History of Present Illness:  22 yu/o female with PMH for PAD, Cociane and THC abuse, HLD, HTN who recently suffered a left MCA stroke who presented with found unresponsive with diarrhea, CT abd showing paralytic ileus and enterocolitis with with heme positive stools significant abdominal discomfort with abdominal distention and persistent nausea and dry heaving... -NG tube placement attempted unsuccessfully. She had episode of bradycardia and rates in the 20's.  Patient had a weak pulse and received Atropine x 2, but then lost her pulse and possibly had V fib/Vtach and then had PEA.  and CPR initiated and given Epinephrine.  ROCS achieved but she was intubated by Anesthesia.   She was hemoccult positive.  She was ordered for 2 units PRBC. Post code cxr showing a right hydropneumothorax and rib fracture possibly during CPR.   Pertinent  Medical History  Arthritis, Barrett's esophagus with esophagitis (03/26/2013), Chronic abdominal pain, Chronic lower back pain, COPD (chronic obstructive pulmonary disease) (HCC), Depression, Dyspnea, GERD (gastroesophageal reflux disease), H/O acute pancreatitis, Heart murmur, Hiatal hernia, High cholesterol, Hypertension, Peptic ulcer, Pneumonia (2000s X 1), Stroke (HCC), and Tobacco use (03/26/2013).    Significant Hospital Events: Including procedures, antibiotic start and stop dates in addition to other pertinent events   6/27: Admit to ICU 6/28 continue to have myoclonus, on high-dose propofol   Interim History / Subjective:  EEG showed continuous myoclonus despite being on high-dose propofol  and Versed  Serum creatinine continued to get worse  Objective    Blood pressure (!) 112/52, pulse 90, temperature (!) 97.3 F (36.3 C), resp. rate (!) 30, height 5' 7 (1.702 m),  weight 103.4 kg, last menstrual period 02/09/2012, SpO2 95%.    Vent Mode: PRVC FiO2 (%):  [50 %-60 %] 50 % Set Rate:  [18 bmp] 18 bmp Vt Set:  [490 mL] 490 mL PEEP:  [10 cmH20] 10 cmH20 Plateau Pressure:  [12 cmH20-24 cmH20] 24 cmH20   Intake/Output Summary (Last 24 hours) at 06/23/2024 1205 Last data filed at 06/23/2024 1000 Gross per 24 hour  Intake 2787.79 ml  Output 1015 ml  Net 1772.79 ml   Filed Weights   06/21/24 2330 06/22/24 0500 06/23/24 0537  Weight: 99.8 kg 102.5 kg 103.4 kg    Examination: General: Crtitically ill-appearing middle-aged female, orally intubated HEENT: [place]/AT, eyes anicteric.  ETT and cortrak in place Neuro: Eyes closed, does not open, not following commands, absent cough and gag, patient is having stimulus induced myoclonus Chest: Coarse breath sounds, no wheezes or rhonchi Heart: Regular rate and rhythm, no murmurs or gallops Abdomen: Soft, nondistended, bowel sounds present  Labs and images reviewed  Patient Lines/Drains/Airways Status     Active Line/Drains/Airways     Name Placement date Placement time Site Days   Peripheral IV 06/21/24 22 G Anterior;Right Forearm 06/21/24  0806  Forearm  2   CVC Triple Lumen 06/21/24 Right Femoral 06/21/24  0718  -- 2   Chest Tube 1 Lateral;Right Pleural 06/21/24  2209  Pleural  2   NG/OG Vented/Dual Lumen 14 Fr. Left nare Marking at nare/corner of mouth 60 cm 06/21/24  2236  Left nare  2   Urethral Catheter Almarie, NT Latex;Temperature probe 14 Fr. 06/21/24  9373  Latex;Temperature probe  2   Fecal Management System 45 mL 06/21/24  1847  -- 2  Airway 7.5 mm 06/21/24  1530  -- 2         Resolved problem list   Assessment and Plan  Status post PEA cardiac arrest Status myoclonus Acute encephalopathy, likely hypoxic/anoxic Acute respiratory failure with hypoxia and hypercapnia Severe sepsis with septic shock due to bilateral lower lobe pneumonia, POA Right-sided pneumothorax Bilateral pleural  effusion status post right-sided chest tube placement Acute metabolic acidosis/lactic acidosis Acute kidney injury due to ischemic ATN in the setting of cardiac arrest, worsening Shock liver Hyponatremia/hypocalcemia Acute GI bleeding Acute blood loss anemia Recent history of stroke with right-sided weakness Propofol  induced hypertriglyceridemia  Continue TTM Patient continued to have myoclonus Appreciate neurology follow-up Currently she is on 75 mics per KG per hour of propofol  and midazolam  40 mg/h Continue Keppra Continue lung protective ventilation VAP prevention bundle in place Continue to require high vasopressor support especially in the setting of deep sedation to control her myoclonus Currently on 40 mics of Levophed Will add vasopressin Chest tube is draining dark fluid Continue broad-spectrum antibiotics with vancomycin  and Zosyn Lactate has cleared Serum creatinine continue to rise, made only 800 cc of urine Will try Lasix 80 mg x 1 LFTs are elevated Avoid nephrotoxic and hepatotoxic agent Closely monitor electrolytes H&H is stable now No more active bleeding Closely monitor triglyceride level  Best Practice (right click and Reselect all SmartList Selections daily)   Diet/type: NPO DVT prophylaxis prophylactic heparin   Pressure ulcer(s): Please refer to nursing notes GI prophylaxis: PPI Lines: Right Femoral Central Line Foley: Still needed Code Status: DNR Goals of care discussion: 6/28 patient's daughter and patient's boyfriend were updated at bedside, decision was to continue full scope of care while keeping her DNR until we get MRI brain and have further discussion   Labs   CBC: Recent Labs  Lab 06/21/24 0722 06/21/24 1539 06/21/24 2138 06/21/24 2319 06/22/24 0504 06/23/24 0331  WBC 34.8* 27.6* 33.1*  --   --  37.6*  NEUTROABS 29.8*  --   --   --   --  33.6*  HGB 11.8* 6.9* 10.9* 10.9* 10.5* 10.0*  HCT 36.1 22.3* 31.7* 32.0* 31.0* 28.9*  MCV  90.5 96.5 90.1  --   --  92.6  PLT 911* 548* 455*  --   --  404*    Basic Metabolic Panel: Recent Labs  Lab 06/21/24 0722 06/21/24 1539 06/21/24 2138 06/21/24 2319 06/22/24 0431 06/22/24 0504 06/22/24 0925 06/23/24 0331  NA 136 140 134* 135  --  136 138 133*  K 3.2* 4.8 4.7 4.8  --  5.4* 5.0 4.4  CL 92* 105 102  --   --   --  105 97*  CO2 18* 14* 18*  --   --   --  16* 18*  GLUCOSE 379* 258* 249*  --   --   --  135* 205*  BUN 68* 78* 76*  --   --   --  82* 81*  CREATININE 2.11* 2.26* 2.59*  --   --   --  3.18* 3.74*  CALCIUM  8.8* 7.3* 6.4*  --   --   --  7.0* 7.2*  MG  --   --  1.2*  --  2.6*  --   --  2.4  PHOS  --  6.5*  --   --  4.5  --   --  7.3*   GFR: Estimated Creatinine Clearance: 20.3 mL/min (A) (by C-G formula based on SCr of 3.74 mg/dL (H)). Recent  Labs  Lab 06/21/24 0611 06/21/24 0722 06/21/24 0845 06/21/24 1539 06/21/24 2138 06/21/24 2140 06/23/24 0331  WBC  --  34.8*  --  27.6* 33.1*  --  37.6*  LATICACIDVEN 8.8*  --  >9.0* >9.0*  --  1.8  --     Liver Function Tests: Recent Labs  Lab 06/21/24 0722 06/21/24 1539 06/21/24 2138 06/23/24 0331  AST 30 51* 130*  --   ALT 30 55* 122*  --   ALKPHOS 60 45 36*  --   BILITOT 0.5 0.5 0.5  --   PROT 6.3* 3.9* 4.0*  --   ALBUMIN 3.0* 1.8* 2.0* 1.6*   No results for input(s): LIPASE, AMYLASE in the last 168 hours. Recent Labs  Lab 06/21/24 0722  AMMONIA <13    ABG    Component Value Date/Time   PHART 7.01 (LL) 06/21/2024 1835   PCO2ART 47 06/21/2024 1835   PO2ART 66 (L) 06/21/2024 1835   HCO3 18.2 (L) 06/22/2024 0504   TCO2 20 (L) 06/22/2024 0504   ACIDBASEDEF 9.0 (H) 06/22/2024 0504   O2SAT 28 06/22/2024 0504     Coagulation Profile: Recent Labs  Lab 06/21/24 0722 06/21/24 2138  INR 1.0 1.4*    Cardiac Enzymes: No results for input(s): CKTOTAL, CKMB, CKMBINDEX, TROPONINI in the last 168 hours.  HbA1C: Hgb A1c MFr Bld  Date/Time Value Ref Range Status  05/27/2024  08:11 PM 6.3 (H) 4.8 - 5.6 % Final    Comment:    (NOTE) Diagnosis of Diabetes The following HbA1c ranges recommended by the American Diabetes Association (ADA) may be used as an aid in the diagnosis of diabetes mellitus.  Hemoglobin             Suggested A1C NGSP%              Diagnosis  <5.7                   Non Diabetic  5.7-6.4                Pre-Diabetic  >6.4                   Diabetic  <7.0                   Glycemic control for                       adults with diabetes.    03/31/2023 06:40 AM 6.1 (H) 4.8 - 5.6 % Final    Comment:    (NOTE) Pre diabetes:          5.7%-6.4%  Diabetes:              >6.4%  Glycemic control for   <7.0% adults with diabetes     CBG: Recent Labs  Lab 06/22/24 2307 06/23/24 0327 06/23/24 0355 06/23/24 0743 06/23/24 1155  GLUCAP 156* 212* 194* 168* 153*     The patient is critically ill due to severe sepsis with septic shock/acute respiratory failure/cardiac arrest.  Critical care was necessary to treat or prevent imminent or life-threatening deterioration.  Critical care was time spent personally by me on the following activities: development of treatment plan with patient and/or surrogate as well as nursing, discussions with consultants, evaluation of patient's response to treatment, examination of patient, obtaining history from patient or surrogate, ordering and performing treatments and interventions, ordering and review of laboratory studies, ordering and review of  radiographic studies, pulse oximetry, re-evaluation of patient's condition and participation in multidisciplinary rounds.   During this encounter critical care time was devoted to patient care services described in this note for 40 minutes.     Valinda Novas, MD [facility] See Amion for pager If no response to pager, please call [phone] until 7pm After 7pm, Please call E-link [phone]

## 2024-06-23 NOTE — Plan of Care (Signed)
  Problem: Health Behavior/Discharge Planning: Goal: Ability to manage health-related needs will improve Outcome: Progressing   Problem: Clinical Measurements: Goal: Respiratory complications will improve Outcome: Progressing   Problem: Activity: Goal: Risk for activity intolerance will decrease Outcome: Progressing   

## 2024-06-24 ENCOUNTER — Inpatient Hospital Stay (HOSPITAL_COMMUNITY)

## 2024-06-24 DIAGNOSIS — G931 Anoxic brain damage, not elsewhere classified: Secondary | ICD-10-CM | POA: Diagnosis not present

## 2024-06-24 DIAGNOSIS — G40901 Epilepsy, unspecified, not intractable, with status epilepticus: Secondary | ICD-10-CM | POA: Diagnosis not present

## 2024-06-24 DIAGNOSIS — I69320 Aphasia following cerebral infarction: Secondary | ICD-10-CM | POA: Diagnosis not present

## 2024-06-24 DIAGNOSIS — E8729 Other acidosis: Secondary | ICD-10-CM | POA: Diagnosis not present

## 2024-06-24 DIAGNOSIS — A419 Sepsis, unspecified organism: Secondary | ICD-10-CM | POA: Diagnosis not present

## 2024-06-24 DIAGNOSIS — J9601 Acute respiratory failure with hypoxia: Secondary | ICD-10-CM | POA: Diagnosis not present

## 2024-06-24 DIAGNOSIS — R569 Unspecified convulsions: Secondary | ICD-10-CM | POA: Diagnosis not present

## 2024-06-24 DIAGNOSIS — K560 Paralytic ileus: Secondary | ICD-10-CM | POA: Diagnosis not present

## 2024-06-24 DIAGNOSIS — R6520 Severe sepsis without septic shock: Secondary | ICD-10-CM | POA: Diagnosis not present

## 2024-06-24 LAB — MAGNESIUM
Magnesium: 2.3 mg/dL (ref 1.7–2.4)
Magnesium: 2.3 mg/dL (ref 1.7–2.4)

## 2024-06-24 LAB — CBC WITH DIFFERENTIAL/PLATELET
Abs Immature Granulocytes: 0 10*3/uL (ref 0.00–0.07)
Abs Immature Granulocytes: 1.43 10*3/uL — ABNORMAL HIGH (ref 0.00–0.07)
Band Neutrophils: 1 %
Basophils Absolute: 0 10*3/uL (ref 0.0–0.1)
Basophils Absolute: 0 10*3/uL (ref 0.0–0.1)
Basophils Relative: 0 %
Basophils Relative: 0 %
Eosinophils Absolute: 0 10*3/uL (ref 0.0–0.5)
Eosinophils Absolute: 0 10*3/uL (ref 0.0–0.5)
Eosinophils Relative: 0 %
Eosinophils Relative: 0 %
HCT: 28.1 % — ABNORMAL LOW (ref 36.0–46.0)
HCT: 26.2 % — ABNORMAL LOW (ref 36.0–46.0)
Hemoglobin: 9.3 g/dL — ABNORMAL LOW (ref 12.0–15.0)
Hemoglobin: 8.6 g/dL — ABNORMAL LOW (ref 12.0–15.0)
Immature Granulocytes: 4 %
Lymphocytes Relative: 10 %
Lymphocytes Relative: 8 %
Lymphs Abs: 3.7 10*3/uL (ref 0.7–4.0)
Lymphs Abs: 3 10*3/uL (ref 0.7–4.0)
MCH: 30.1 pg (ref 26.0–34.0)
MCH: 30.6 pg (ref 26.0–34.0)
MCHC: 33.1 g/dL (ref 30.0–36.0)
MCHC: 32.8 g/dL (ref 30.0–36.0)
MCV: 90.9 fL (ref 80.0–100.0)
MCV: 93.2 fL (ref 80.0–100.0)
Monocytes Absolute: 0.4 10*3/uL (ref 0.1–1.0)
Monocytes Absolute: 2.7 10*3/uL — ABNORMAL HIGH (ref 0.1–1.0)
Monocytes Relative: 1 %
Monocytes Relative: 7 %
Neutro Abs: 33.3 10*3/uL — ABNORMAL HIGH (ref 1.7–7.7)
Neutro Abs: 32.1 10*3/uL — ABNORMAL HIGH (ref 1.7–7.7)
Neutrophils Relative %: 88 %
Neutrophils Relative %: 81 %
Platelets: 381 10*3/uL (ref 150–400)
Platelets: 362 10*3/uL (ref 150–400)
RBC: 3.09 MIL/uL — ABNORMAL LOW (ref 3.87–5.11)
RBC: 2.81 MIL/uL — ABNORMAL LOW (ref 3.87–5.11)
RDW: 16 % — ABNORMAL HIGH (ref 11.5–15.5)
RDW: 16.4 % — ABNORMAL HIGH (ref 11.5–15.5)
Smear Review: NORMAL
WBC: 37.4 10*3/uL — ABNORMAL HIGH (ref 4.0–10.5)
WBC: 39.3 10*3/uL — ABNORMAL HIGH (ref 4.0–10.5)
nRBC: 0 /100{WBCs}
nRBC: 0.1 % (ref 0.0–0.2)
nRBC: 0.1 % (ref 0.0–0.2)

## 2024-06-24 LAB — TROPONIN I (HIGH SENSITIVITY): Troponin I (High Sensitivity): 421 ng/L (ref <18)

## 2024-06-24 LAB — POCT I-STAT 7, (LYTES, BLD GAS, ICA,H+H)
Acid-base deficit: 8 mmol/L — ABNORMAL HIGH (ref 0.0–2.0)
Bicarbonate: 20 mmol/L (ref 20.0–28.0)
Calcium, Ion: 1.01 mmol/L — ABNORMAL LOW (ref 1.15–1.40)
HCT: 25 % — ABNORMAL LOW (ref 36.0–46.0)
Hemoglobin: 8.5 g/dL — ABNORMAL LOW (ref 12.0–15.0)
O2 Saturation: 92 %
Patient temperature: 97.8
Potassium: 3.9 mmol/L (ref 3.5–5.1)
Sodium: 137 mmol/L (ref 135–145)
TCO2: 22 mmol/L (ref 22–32)
pCO2 arterial: 53.7 mmHg — ABNORMAL HIGH (ref 32–48)
pH, Arterial: 7.177 — CL (ref 7.35–7.45)
pO2, Arterial: 79 mmHg — ABNORMAL LOW (ref 83–108)

## 2024-06-24 LAB — GLUCOSE, CAPILLARY
Glucose-Capillary: 162 mg/dL — ABNORMAL HIGH (ref 70–99)
Glucose-Capillary: 140 mg/dL — ABNORMAL HIGH (ref 70–99)
Glucose-Capillary: 148 mg/dL — ABNORMAL HIGH (ref 70–99)
Glucose-Capillary: 129 mg/dL — ABNORMAL HIGH (ref 70–99)
Glucose-Capillary: 122 mg/dL — ABNORMAL HIGH (ref 70–99)
Glucose-Capillary: 124 mg/dL — ABNORMAL HIGH (ref 70–99)

## 2024-06-24 LAB — CK TOTAL AND CKMB (NOT AT ARMC)
CK, MB: 22.5 ng/mL — ABNORMAL HIGH (ref 0.5–5.0)
Total CK: 181 U/L (ref 38–234)

## 2024-06-24 LAB — FIBRINOGEN: Fibrinogen: >800 mg/dL — ABNORMAL HIGH (ref 210–475)

## 2024-06-24 LAB — RESP PANEL BY RT-PCR (RSV, FLU A&B, COVID)  RVPGX2
Influenza A by PCR: NEGATIVE
Influenza B by PCR: NEGATIVE
Resp Syncytial Virus by PCR: NEGATIVE
SARS Coronavirus 2 by RT PCR: NEGATIVE

## 2024-06-24 LAB — COMPREHENSIVE METABOLIC PANEL WITH GFR
ALT: 160 U/L — ABNORMAL HIGH (ref 0–44)
AST: 85 U/L — ABNORMAL HIGH (ref 15–41)
Albumin: <1.5 g/dL — ABNORMAL LOW (ref 3.5–5.0)
Alkaline Phosphatase: 149 U/L — ABNORMAL HIGH (ref 38–126)
Anion gap: 18 — ABNORMAL HIGH (ref 5–15)
BUN: 89 mg/dL — ABNORMAL HIGH (ref 6–20)
CO2: 18 mmol/L — ABNORMAL LOW (ref 22–32)
Calcium: 7.2 mg/dL — ABNORMAL LOW (ref 8.9–10.3)
Chloride: 101 mmol/L (ref 98–111)
Creatinine, Ser: 4.6 mg/dL — ABNORMAL HIGH (ref 0.44–1.00)
GFR, Estimated: 10 mL/min — ABNORMAL LOW (ref >60)
Glucose, Bld: 136 mg/dL — ABNORMAL HIGH (ref 70–99)
Potassium: 4 mmol/L (ref 3.5–5.1)
Sodium: 137 mmol/L (ref 135–145)
Total Bilirubin: 0.4 mg/dL (ref 0.0–1.2)
Total Protein: 4.7 g/dL — ABNORMAL LOW (ref 6.5–8.1)

## 2024-06-24 LAB — RENAL FUNCTION PANEL
Albumin: <1.5 g/dL — ABNORMAL LOW (ref 3.5–5.0)
Anion gap: 20 — ABNORMAL HIGH (ref 5–15)
BUN: 83 mg/dL — ABNORMAL HIGH (ref 6–20)
CO2: 19 mmol/L — ABNORMAL LOW (ref 22–32)
Calcium: 7.4 mg/dL — ABNORMAL LOW (ref 8.9–10.3)
Chloride: 97 mmol/L — ABNORMAL LOW (ref 98–111)
Creatinine, Ser: 4.49 mg/dL — ABNORMAL HIGH (ref 0.44–1.00)
GFR, Estimated: 11 mL/min — ABNORMAL LOW (ref >60)
Glucose, Bld: 164 mg/dL — ABNORMAL HIGH (ref 70–99)
Phosphorus: 9.3 mg/dL — ABNORMAL HIGH (ref 2.5–4.6)
Potassium: 4.3 mmol/L (ref 3.5–5.1)
Sodium: 136 mmol/L (ref 135–145)

## 2024-06-24 LAB — HEMOGLOBIN A1C
Hgb A1c MFr Bld: 5.5 % (ref 4.8–5.6)
Mean Plasma Glucose: 111.15 mg/dL

## 2024-06-24 LAB — PHOSPHORUS: Phosphorus: 11.2 mg/dL — ABNORMAL HIGH (ref 2.5–4.6)

## 2024-06-24 LAB — AMYLASE: Amylase: 82 U/L (ref 28–100)

## 2024-06-24 LAB — APTT: aPTT: 32 s (ref 24–36)

## 2024-06-24 LAB — BILIRUBIN, DIRECT: Bilirubin, Direct: <0.1 mg/dL (ref 0.0–0.2)

## 2024-06-24 LAB — LACTIC ACID, PLASMA: Lactic Acid, Venous: 1.7 mmol/L (ref 0.5–1.9)

## 2024-06-24 LAB — ALBUMIN: Albumin: <1.5 g/dL — ABNORMAL LOW (ref 3.5–5.0)

## 2024-06-24 LAB — PROTIME-INR
INR: 1.4 — ABNORMAL HIGH (ref 0.8–1.2)
Prothrombin Time: 17.5 s — ABNORMAL HIGH (ref 11.4–15.2)

## 2024-06-24 LAB — VANCOMYCIN, RANDOM: Vancomycin Rm: 22 ug/mL

## 2024-06-24 LAB — HCG, QUANTITATIVE, PREGNANCY: hCG, Beta Chain, Quant, S: 3 m[IU]/mL (ref <5)

## 2024-06-24 LAB — VALPROIC ACID LEVEL: Valproic Acid Lvl: 43 ug/mL — ABNORMAL LOW (ref 50–100)

## 2024-06-24 LAB — TRIGLYCERIDES: Triglycerides: 341 mg/dL — ABNORMAL HIGH (ref <150)

## 2024-06-24 LAB — LIPASE, BLOOD: Lipase: 77 U/L — ABNORMAL HIGH (ref 11–51)

## 2024-06-24 MED ORDER — KETAMINE BOLUS VIA INFUSION
0.5000 mg/kg | Freq: Once | INTRAVENOUS | Status: AC
  Administered 2024-06-24: 52.45 mg via INTRAVENOUS
  Filled 2024-06-24: qty 55

## 2024-06-24 MED ORDER — KETAMINE HCL-SODIUM CHLORIDE 1000-0.69 MG/100ML-% IV SOLN
2.0000 mg/kg/h | INTRAVENOUS | Status: DC
Start: 2024-06-24 — End: 2024-06-27
  Administered 2024-06-24 (×2): 2 mg/kg/h via INTRAVENOUS
  Administered 2024-06-24: 1 mg/kg/h via INTRAVENOUS
  Administered 2024-06-24 – 2024-06-26 (×10): 2 mg/kg/h via INTRAVENOUS
  Filled 2024-06-24 (×15): qty 100

## 2024-06-24 MED ORDER — KETAMINE BOLUS VIA INFUSION
1.5000 mg/kg | Freq: Once | INTRAVENOUS | Status: AC
  Administered 2024-06-24: 157.35 mg via INTRAVENOUS

## 2024-06-24 MED ORDER — VALPROATE SODIUM 100 MG/ML IV SOLN
500.0000 mg | Freq: Three times a day (TID) | INTRAVENOUS | Status: DC
  Administered 2024-06-24 – 2024-06-26 (×6): 500 mg via INTRAVENOUS
  Filled 2024-06-24 (×9): qty 5

## 2024-06-24 MED ORDER — VALPROATE SODIUM 100 MG/ML IV SOLN
2000.0000 mg | Freq: Once | INTRAVENOUS | Status: AC
  Administered 2024-06-24: 2000 mg via INTRAVENOUS
  Filled 2024-06-24: qty 20

## 2024-06-24 MED ORDER — SODIUM BICARBONATE 8.4 % IV SOLN
100.0000 meq | Freq: Once | INTRAVENOUS | Status: AC
  Administered 2024-06-24: 100 meq via INTRAVENOUS
  Filled 2024-06-24: qty 100

## 2024-06-24 NOTE — Progress Notes (Signed)
 eLink Physician-Brief Progress Note Patient Name: Rita [name] DOB: Jan 17, 1966 MRN: [id number]   Date of Service  06/24/2024  HPI/Events of Note  pt is donor, HB requesting the following:  WBCs still going up, 39 they have requested to have another dose of Vanc given  Albumin < 1.5, asking to given albumin  pt needs covid test orders  eICU Interventions  Can obtain Vanc random trough and reassess dosing with pharmacy. At last check she was therapeutic.  Low albumin is not an indication to give albumin, deferred this.  Covid testing ordered.   0127 -ABG reviewed with primarily metabolic acidosis, small amount of respiratory acidosis.  pH is reasonable, increase respiratory rate to 25.  No other intervention.  9365 - K3.4, Kcl, calcium   Intervention Category Minor Interventions: Routine modifications to care plan (e.g. PRN medications for pain, fever)  Fabrizzio Marcella 06/24/2024, 8:49 PM

## 2024-06-24 NOTE — Progress Notes (Signed)
LTM EEG disconnected - no skin breakdown at unhook. [facility] notified.  

## 2024-06-24 NOTE — Progress Notes (Addendum)
 EEG with increased myoclonic activity  Give versed  bolus 10 mg  Depakote 20 mg/kg loading dose once (2 g) ordered  5:35 AM Addendum  EEG still active, give additional versed  10 mg   Depakote being started now as well   Lola Jernigan MD-PhD [facility] [phone] Available 7 PM to 7 AM, outside of these hours please call Neurologist on call as listed on Amion.

## 2024-06-24 NOTE — Progress Notes (Signed)
 vLTM re-hooked after MRI All impedances below 10k.  [facility] notified when back online for viewing.  No skin breakdown noted at all skin sites

## 2024-06-24 NOTE — Progress Notes (Signed)
 NAME:  Rita [name], MRN:  [id number], DOB:  10/08/1966, LOS: 3 ADMISSION DATE:  06/21/2024, CONSULTATION DATE:  06/21/2024 REFERRING MD:  Pearlean Manus, MD, CHIEF COMPLAINT:  Sepsis  History of Present Illness:  1 yu/o female with PMH for PAD, Cociane and THC abuse, HLD, HTN who recently suffered a left MCA stroke who presented with found unresponsive with diarrhea, CT abd showing paralytic ileus and enterocolitis with with heme positive stools significant abdominal discomfort with abdominal distention and persistent nausea and dry heaving... -NG tube placement attempted unsuccessfully. She had episode of bradycardia and rates in the 20's.  Patient had a weak pulse and received Atropine x 2, but then lost her pulse and possibly had V fib/Vtach and then had PEA.  and CPR initiated and given Epinephrine.  ROCS achieved but she was intubated by Anesthesia.   She was hemoccult positive.  She was ordered for 2 units PRBC. Post code cxr showing a right hydropneumothorax and rib fracture possibly during CPR.   Pertinent  Medical History  Arthritis, Barrett's esophagus with esophagitis (03/26/2013), Chronic abdominal pain, Chronic lower back pain, COPD (chronic obstructive pulmonary disease) (HCC), Depression, Dyspnea, GERD (gastroesophageal reflux disease), H/O acute pancreatitis, Heart murmur, Hiatal hernia, High cholesterol, Hypertension, Peptic ulcer, Pneumonia (2000s X 1), Stroke (HCC), and Tobacco use (03/26/2013).    Significant Hospital Events: Including procedures, antibiotic start and stop dates in addition to other pertinent events   6/27: Admit to ICU 6/28 continue to have myoclonus, on high-dose propofol  6/29 triglyceride went up, propofol  was decreased, placed on Versed   Interim History / Subjective:  Patient continued to have stimulus induced twitching Propofol  was stopped, Versed  remain at 40 mg/h Patient was loaded with Depakote overnight Made 3.6 L of urine with Lasix  challenge but serum creatinine continued to rise  Objective    Blood pressure 118/63, pulse 87, temperature 99 F (37.2 C), resp. rate 20, height 5' 7 (1.702 m), weight 104.9 kg, last menstrual period 02/09/2012, SpO2 94%.    Vent Mode: PRVC FiO2 (%):  [50 %] 50 % Set Rate:  [18 bmp] 18 bmp Vt Set:  [490 mL] 490 mL PEEP:  [10 cmH20] 10 cmH20 Plateau Pressure:  [20 cmH20-24 cmH20] 20 cmH20   Intake/Output Summary (Last 24 hours) at 06/24/2024 0815 Last data filed at 06/24/2024 0701 Gross per 24 hour  Intake 2857.03 ml  Output 3740 ml  Net -882.97 ml   Filed Weights   06/22/24 0500 06/23/24 0537 06/24/24 0429  Weight: 102.5 kg 103.4 kg 104.9 kg    Examination: General: Crtitically ill-appearing middle-age female, orally intubated HEENT: [place]/AT, eyes anicteric.  ETT and OGT in place Neuro: Eyes closed, does not open, not following commands, positive corneals, requiring vent, repeated movement with painful stimuli in right upper extremity Chest: Coarse breath sounds all over, no wheezes Heart: Regular rate and rhythm, no murmurs or gallops Abdomen: Soft, nondistended, bowel sounds present  Labs and images reviewed  Patient Lines/Drains/Airways Status     Active Line/Drains/Airways     Name Placement date Placement time Site Days   Peripheral IV 06/21/24 22 G Anterior;Right Forearm 06/21/24  0806  Forearm  3   CVC Triple Lumen 06/21/24 Right Femoral 06/21/24  0718  -- 3   Chest Tube 1 Lateral;Right Pleural 06/21/24  2209  Pleural  3   NG/OG Vented/Dual Lumen 14 Fr. Left nare Marking at nare/corner of mouth 60 cm 06/21/24  2236  Left nare  3   Urethral Catheter  Almarie, NT Latex;Temperature probe 14 Fr. 06/21/24  9373  Latex;Temperature probe  3   Fecal Management System 45 mL 06/21/24  1847  -- 3   Airway 7.5 mm 06/21/24  1530  -- 3        Resolved problem list   Assessment and Plan  Status post PEA cardiac arrest Status myoclonus Acute encephalopathy, likely  hypoxic/anoxic Acute respiratory failure with hypoxia and hypercapnia Severe sepsis with septic shock due to bilateral lower lobe pneumonia, POA MRSE bacteremia Right-sided pneumothorax Bilateral pleural effusion status post right-sided chest tube placement Acute metabolic acidosis/lactic acidosis Acute kidney injury due to ischemic ATN in the setting of cardiac arrest, worsening Shock liver Hyponatremia/hypocalcemia, resolved Hypophosphatemia Acute GI bleeding Acute blood loss anemia Recent history of stroke with right-sided weakness Propofol  induced hypertriglyceridemia, improving  Continue TTM and supportive care Continue to have stimulus induced muscle twitching Neurology is following Currently on Versed  at 40 mg/h Propofol  was stopped due to hypertriglyceridemia Loaded with valproic acid overnight Continue Keppra Per neurology patient is being started on ketamine  continue on protective ventilation VAP prevention bundle in place PAD protocol with Versed  and ketamine  Continue to require vasopressor support, currently on 30 mics of Levophed and vasopressin Continue to titrate with MAP goal 65 Continue chest tube on suction, draining dark fluid Continue broad-spectrum antibiotics with vancomycin  and Zosyn Will get MRI brain to rule out anoxic brain injury Serum creatinine continue to rise despite she made 3.6 L of urine with Lasix saline Holding diuretics Closely monitor and supplement electrolytes H&H is stable GI bleeding has stopped   Best Practice (right click and Reselect all SmartList Selections daily)   Diet/type: NPO  DVT prophylaxis prophylactic heparin   Pressure ulcer(s): Please refer to nursing notes GI prophylaxis: PPI Lines: Right Femoral Central Line Foley: Still needed Code Status: DNR Goals of care discussion: 6/28 patient's daughter and patient's boyfriend were updated at bedside, decision was to continue full scope of care while keeping her DNR  until we get MRI brain and have further discussion   Labs   CBC: Recent Labs  Lab 06/21/24 0722 06/21/24 1539 06/21/24 2138 06/21/24 2319 06/22/24 0431 06/22/24 0504 06/23/24 0331 06/24/24 0418  WBC 34.8* 27.6* 33.1*  --  35.7*  --  37.6* 37.4*  NEUTROABS 29.8*  --   --   --   --   --  33.6* 33.3*  HGB 11.8* 6.9* 10.9* 10.9* 10.7* 10.5* 10.0* 9.3*  HCT 36.1 22.3* 31.7* 32.0* 31.4* 31.0* 28.9* 28.1*  MCV 90.5 96.5 90.1  --  90.0  --  92.6 90.9  PLT 911* 548* 455*  --  457*  --  404* 381    Basic Metabolic Panel: Recent Labs  Lab 06/21/24 1539 06/21/24 2138 06/21/24 2319 06/22/24 0431 06/22/24 0504 06/22/24 0925 06/23/24 0331 06/24/24 0418  NA 140 134*   < > 137 136 138 133* 136  K 4.8 4.7   < > 5.2* 5.4* 5.0 4.4 4.3  CL 105 102  --  102  --  105 97* 97*  CO2 14* 18*  --  18*  --  16* 18* 19*  GLUCOSE 258* 249*  --  205*  --  135* 205* 164*  BUN 78* 76*  --  80*  --  82* 81* 83*  CREATININE 2.26* 2.59*  --  2.99*  --  3.18* 3.74* 4.49*  CALCIUM  7.3* 6.4*  --  6.2*  --  7.0* 7.2* 7.4*  MG  --  1.2*  --  2.6*  --   --  2.4 2.3  PHOS 6.5*  --   --  4.5  --   --  7.3* 9.3*   < > = values in this interval not displayed.   GFR: Estimated Creatinine Clearance: 17 mL/min (A) (by C-G formula based on SCr of 4.49 mg/dL (H)). Recent Labs  Lab 06/21/24 0611 06/21/24 0722 06/21/24 0845 06/21/24 1539 06/21/24 2138 06/21/24 2140 06/22/24 0431 06/23/24 0331 06/24/24 0418  WBC  --    < >  --  27.6* 33.1*  --  35.7* 37.6* 37.4*  LATICACIDVEN 8.8*  --  >9.0* >9.0*  --  1.8  --   --   --    < > = values in this interval not displayed.    Liver Function Tests: Recent Labs  Lab 06/21/24 0722 06/21/24 1539 06/21/24 2138 06/22/24 0431 06/23/24 0331 06/23/24 0800 06/24/24 0418  AST 30 51* 130* 175*  --  145*  --   ALT 30 55* 122* 137*  --  172*  --   ALKPHOS 60 45 36* 40  --  63  --   BILITOT 0.5 0.5 0.5 0.9  --  0.4  --   PROT 6.3* 3.9* 4.0* 4.0*  --  4.3*  --    ALBUMIN 3.0* 1.8* 2.0* 1.9* 1.6* 1.6* <1.5*   No results for input(s): LIPASE, AMYLASE in the last 168 hours. Recent Labs  Lab 06/21/24 0722  AMMONIA <13    ABG    Component Value Date/Time   PHART 7.01 (LL) 06/21/2024 1835   PCO2ART 47 06/21/2024 1835   PO2ART 66 (L) 06/21/2024 1835   HCO3 18.2 (L) 06/22/2024 0504   TCO2 20 (L) 06/22/2024 0504   ACIDBASEDEF 9.0 (H) 06/22/2024 0504   O2SAT 28 06/22/2024 0504     Coagulation Profile: Recent Labs  Lab 06/21/24 0722 06/21/24 2138 06/22/24 0431  INR 1.0 1.4* 1.4*    Cardiac Enzymes: No results for input(s): CKTOTAL, CKMB, CKMBINDEX, TROPONINI in the last 168 hours.  HbA1C: Hgb A1c MFr Bld  Date/Time Value Ref Range Status  05/27/2024 08:11 PM 6.3 (H) 4.8 - 5.6 % Final    Comment:    (NOTE) Diagnosis of Diabetes The following HbA1c ranges recommended by the American Diabetes Association (ADA) may be used as an aid in the diagnosis of diabetes mellitus.  Hemoglobin             Suggested A1C NGSP%              Diagnosis  <5.7                   Non Diabetic  5.7-6.4                Pre-Diabetic  >6.4                   Diabetic  <7.0                   Glycemic control for                       adults with diabetes.    03/31/2023 06:40 AM 6.1 (H) 4.8 - 5.6 % Final    Comment:    (NOTE) Pre diabetes:          5.7%-6.4%  Diabetes:              >6.4%  Glycemic control for   <  7.0% adults with diabetes     CBG: Recent Labs  Lab 06/23/24 1155 06/23/24 1548 06/23/24 1920 06/23/24 2314 06/24/24 0325  GLUCAP 153* 172* 138* 146* 162*     The patient is critically ill due to severe sepsis with septic shock/acute respiratory failure/cardiac arrest.  Critical care was necessary to treat or prevent imminent or life-threatening deterioration.  Critical care was time spent personally by me on the following activities: development of treatment plan with patient and/or surrogate as well as nursing,  discussions with consultants, evaluation of patient's response to treatment, examination of patient, obtaining history from patient or surrogate, ordering and performing treatments and interventions, ordering and review of laboratory studies, ordering and review of radiographic studies, pulse oximetry, re-evaluation of patient's condition and participation in multidisciplinary rounds.   During this encounter critical care time was devoted to patient care services described in this note for 39 minutes.     Valinda Novas, MD [place] [facility] See Amion for pager If no response to pager, please call [phone] until 7pm After 7pm, Please call E-link [phone]

## 2024-06-24 NOTE — Procedures (Signed)
 Patient Name: Rita [name]  MRN: [id number]  Epilepsy Attending: Arlin MALVA Krebs  Referring Physician/Provider: Ogan, Okoronkwo U, MD  Duration: 06/23/2024 0654 to 06/24/2024 9345   Patient history:  58 y.o. female with concern for myoclonic seizure activity after PEA arrest at approximately 5 AM on 6/27. EEG to evaluate for seizure.   Level of alertness:  comatose   AEDs during EEG study: Propofol , Versed , LEV, VPA   Technical aspects: This EEG study was done with scalp electrodes positioned according to the 10-20 International system of electrode placement. Electrical activity was reviewed with band pass filter of 1-70Hz , sensitivity of 7 uV/mm, display speed of 25mm/sec with a 60Hz  notched filter applied as appropriate. EEG data were recorded continuously and digitally stored.  Video monitoring was available and reviewed as appropriate.   Description: EEG initially showed burst suppression with burst of 3-6hz  theta-delta slowing and generalized spikes lasting 3-4 seconds admixed with generalized suppression lasting 4-12 seconds. Gradually EEG worsened and showed near continuous generalized 5 to 6 Hz theta slowing admixed with generalized spikes with fluctuating frequency of 1 to 5 Hz.  Hyperventilation and photic stimulation were not performed.     Event button was pressed on 06/23/2024 at 0817.  Propofol  was held for neurologic exam and patient was noted to have right upper extremity twitching.  Concomitant EEG showed generalized spikes consistent with myoclonic seizure   ABNORMALITY - Myoclonic seizure, generalized - Spikes, generalized - Burst suppression, generalized   IMPRESSION: This study showed evidence of epileptogenicity with generalized onset as well as severe diffuse encephalopathy.  Due to the frequency of spikes, this EEG was on the ictal-interictal continuum with high suspicion for ictal nature.  One event was reported on 06/23/2024 at 0817 during which patient had right  upper extremity twitching after propofol  was held further neurologic exam.  Concomitant EEG showed generalized spikes consistent with myoclonic seizure.   [name]

## 2024-06-24 NOTE — Progress Notes (Addendum)
 NEUROLOGY CONSULT FOLLOW UP NOTE   Date of service: June 24, 2024 Patient Name: Rita [name] MRN:  [id number] DOB:  08-31-1966  Interval Hx/subjective   Patient was weaned off of propofol , and has frequent generalized discharges which I do feel have some degree of evolution  Vitals   Vitals:   06/24/24 1200 06/24/24 1205 06/24/24 1215 06/24/24 1230  BP: (!) 109/57  (!) 105/51 (!) 106/50  Pulse: 89  87 87  Resp: 19  19 19   Temp:  97.6 F (36.4 C)    TempSrc:  Oral    SpO2: 91%  90% 91%  Weight:      Height:         Body mass index is 36.22 kg/m.  Physical Exam  General: Sedated intubated HEENT: Normocephalic atraumatic Lungs: Vented  Neurological exam Breathing over the ventilator No response to voice Sedated intubated Propofol  held for exam Continues to be on Versed  drip Cranial nerves: Pupils are equal very sluggishly reactive,corneal reflexes present, absent VOR, gaze midline Motor examination precipitated subtle generalized myoclonic-like activity Sensory exam: As above  Medications  Current Facility-Administered Medications:    0.9 %  sodium chloride  infusion (Manually program via Guardrails IV Fluids), , Intravenous, Once, Emokpae, Courage, MD   acetaminophen  (TYLENOL ) tablet 650 mg, 650 mg, Oral, Q6H PRN **OR** acetaminophen  (TYLENOL ) suppository 650 mg, 650 mg, Rectal, Q6H PRN, Emokpae, Courage, MD   atorvastatin  (LIPITOR ) tablet 80 mg, 80 mg, Per Tube, Daily, Hunsucker, Donnice SAUNDERS, MD, 80 mg at 06/24/24 1125   bisacodyl  (DULCOLAX) suppository 10 mg, 10 mg, Rectal, Daily PRN, Pearlean, Courage, MD   Chlorhexidine  Gluconate Cloth 2 % PADS 6 each, 6 each, Topical, Q0600, Emokpae, Courage, MD, 6 each at 06/24/24 0608   feeding supplement (ENSURE PLUS HIGH PROTEIN) liquid 237 mL, 237 mL, Oral, TID, Emokpae, Courage, MD, 237 mL at 06/22/24 2125   fentaNYL  (SUBLIMAZE ) injection 50-200 mcg, 50-200 mcg, Intravenous, Q30 min PRN, Emokpae, Courage, MD, 100 mcg at  06/22/24 2003   fentaNYL  in NS (51mcg/ml) infusion-PREMIX, 0-400 mcg/hr, Intravenous, Continuous, Maree Harder, MD, Stopped at 06/24/24 9160   heparin  injection 5,000 Units, 5,000 Units, Subcutaneous, Q8H, Maree Harder, MD, 5,000 Units at 06/24/24 9396   insulin  aspart (novoLOG ) injection 0-9 Units, 0-9 Units, Subcutaneous, Q4H, Maree Harder, MD, 1 Units at 06/24/24 1305   ipratropium-albuterol  (DUONEB) 0.5-2.5 (3) MG/3ML nebulizer solution 3 mL, 3 mL, Nebulization, Q4H PRN, Maree Harder, MD   ketamine  (KETALAR ) adult IV infusion 1000mg /110mL (10 mg/mL-Premix), 2 mg/kg/hr, Intravenous, Continuous, Michaela Aisha SQUIBB, MD, Last Rate: 21 mL/hr at 06/24/24 1300, 2 mg/kg/hr at 06/24/24 1300   ketamine  (KETALAR ) bolus via infusion 52.45 mg, 0.5 mg/kg, Intravenous, Once, Michaela Aisha SQUIBB, MD   levETIRAcetam (KEPPRA) undiluted injection 1,000 mg, 1,000 mg, Intravenous, BID, Ogan, Okoronkwo U, MD, 1,000 mg at 06/24/24 1125   midazolam  (VERSED ) 500 mg in sodium chloride  0.9 % 500 mL (1 mg/mL) infusion, 10-60 mg/hr, Intravenous, Continuous, Chand, Valinda, MD, Last Rate: 40 mL/hr at 06/24/24 1300, 40 mg/hr at 06/24/24 1300   midazolam  (VERSED ) bolus via infusion 10 mg, 10 mg, Intravenous, Q5 min PRN, Bhagat, Srishti L, MD, 10 mg at 06/24/24 0535   norepinephrine (LEVOPHED) 16 mg in 250mL (0.064 mg/mL) premix infusion, 0-40 mcg/min, Intravenous, Titrated, Chand, Valinda, MD, Last Rate: 30 mL/hr at 06/24/24 1300, 32 mcg/min at 06/24/24 1300   ondansetron  (ZOFRAN ) tablet 4 mg, 4 mg, Oral, Q6H PRN **OR** ondansetron  (ZOFRAN ) injection 4 mg, 4 mg, Intravenous,  Q6H PRN, Pearlean Manus, MD   Oral care mouth rinse, 15 mL, Mouth Rinse, Q2H, Hunsucker, Donnice SAUNDERS, MD, 15 mL at 06/24/24 1200   Oral care mouth rinse, 15 mL, Mouth Rinse, PRN, Hunsucker, Donnice SAUNDERS, MD   pantoprazole  (PROTONIX ) injection 40 mg, 40 mg, Intravenous, Q12H, Emokpae, Courage, MD, 40 mg at 06/24/24 1125    piperacillin-tazobactam (ZOSYN) IVPB 3.375 g, 3.375 g, Intravenous, Q8H, Harold Scholz, MD, Stopped at 06/24/24 9047   polyethylene glycol (MIRALAX  / GLYCOLAX ) packet 17 g, 17 g, Oral, Daily PRN, Emokpae, Courage, MD   sodium chloride  flush (NS) 0.9 % injection 3 mL, 3 mL, Intravenous, Q12H, Emokpae, Courage, MD, 3 mL at 06/24/24 0949   sodium chloride  flush (NS) 0.9 % injection 3 mL, 3 mL, Intravenous, Q12H, Emokpae, Courage, MD, 3 mL at 06/24/24 0949   sodium chloride  flush (NS) 0.9 % injection 3 mL, 3 mL, Intravenous, PRN, Emokpae, Courage, MD   valproate (DEPACON) 500 mg in dextrose  5 % 50 mL IVPB, 500 mg, Intravenous, Q8H, Michaela Aisha SQUIBB, MD   vancomycin  variable dose per unstable renal function (pharmacist dosing), , Does not apply, See admin instructions, Serena Morna SAUNDERS, RPH   vasopressin (PITRESSIN) 20 Units in 100 mL (0.2 unit/mL) infusion-*FOR SHOCK*, 0-0.03 Units/min, Intravenous, Continuous, Chand, Sudham, MD, Last Rate: 9 mL/hr at 06/24/24 1300, 0.03 Units/min at 06/24/24 1300  Labs and Diagnostic Imaging   CBC:  Recent Labs  Lab 06/23/24 0331 06/24/24 0418  WBC 37.6* 37.4*  NEUTROABS 33.6* 33.3*  HGB 10.0* 9.3*  HCT 28.9* 28.1*  MCV 92.6 90.9  PLT 404* 381    Basic Metabolic Panel:  Lab Results  Component Value Date   NA 136 06/24/2024   K 4.3 06/24/2024   CO2 19 (L) 06/24/2024   GLUCOSE 164 (H) 06/24/2024   BUN 83 (H) 06/24/2024   CREATININE 4.49 (H) 06/24/2024   CALCIUM  7.4 (L) 06/24/2024   GFRNONAA 11 (L) 06/24/2024   GFRAA >60 01/24/2019   Lipid Panel:  Lab Results  Component Value Date   LDLCALC UNABLE TO CALCULATE IF TRIGLYCERIDE OVER 400 mg/dL 93/96/7974   YhaJ8r:  Lab Results  Component Value Date   HGBA1C 6.3 (H) 05/27/2024   Urine Drug Screen:     Component Value Date/Time   LABOPIA NONE DETECTED 06/13/2024 0247   COCAINSCRNUR NONE DETECTED 06/13/2024 0247   LABBENZ NONE DETECTED 06/13/2024 0247   AMPHETMU NONE DETECTED  06/13/2024 0247   THCU NONE DETECTED 06/13/2024 0247   LABBARB NONE DETECTED 06/13/2024 0247    Alcohol  Level     Component Value Date/Time   [facility] <15 06/21/2024 0722   INR  Lab Results  Component Value Date   INR 1.4 (H) 06/22/2024   APTT  Lab Results  Component Value Date   APTT 29 06/22/2024     CT Head without contrast(Personally reviewed): Evolving subacute to chronic left MCA territory infarct corresponding to findings on prior MRI.  Additional remote left PCA territory and watershed infarct and remote infarcts in the cerebellum.  cEEG (personally reviewed): She has generalized discharges which occur at times in clusters, and do seem to vary in frequency at times, suggesting evolution.  This is on the ictal-interictal continuum, but I would favor treating it as an ictal pattern.  Assessment   Rita [name] is a 58 y.o. female past history of a recent left MCA territory infarct status post EVT and stent placement in the MCA, peripheral artery disease, cocaine and THC abuse,  hypertension, hyperlipidemia, found unresponsive with diarrhea and abdominal imaging with paralytic ileus and enterocolitis with heme positive stools, became bradycardic and then developed V-fib/V. tach and then had a PEA arrest with CPR initiation-unclear downtime-could be 6 to 8 minutes.    Myoclonic jerking noted after the cardiac arrest for which neurology was consulted.  Started on continuous EEG after being given Keppra load and started on propofol  drip.    Exam remains very poor In the setting of her recent large left MCA stroke, septic shock from paralytic ileus/enterocolitis and also concern for pneumonia and acute kidney injury L as well as GI bleeding, she has multiple factors working against any chance of neurological recovery.  That being said, there is a fairly continuous background with generalized discharges superimposed, and I am not certain if this truly does represent the dismal prognostic  myoclonic status that is often seen in a post anoxic state.  If an MRI were to reveal no evidence of anoxic brain injury, could consider aggressive treatment.  That being said, given her recent fairly severe stroke, prolonged super refractory status epilepticus, and other multiorgan system failure, I do think palliative conversations are appropriate and the family does not feel that she would want extremely prolonged treatment, then I do think comfort measures would be appropriate.  As we are having these conversations, I would favor starting ketamine  in the interim.  Impression:  Likely anoxic brain injury Recent large left MCA stroke with global aphasia Paralytic ileus/enterocolitis and concern for pneumonia causing septic shock Status epilepticus Acute kidney injury GI bleeding  Recommendations  Management of the infectious process and GI bleed as well as kidney function per primary team. Continue Keppra 1g twice daily. Continue current doses of Versed   Start ketamine  1.5 mg/kg load, followed by titration Continue Depakote 500 3 times daily  Agree with palliative discussions with the family to see what degree of aggressiveness the patient would want   ______________________________________________________________________  Aisha Seals, MD [facility]   If 7pm- 7am, please page neurology on call as listed in AMION.   CRITICAL CARE ATTESTATION Performed by: Aisha Seals Total critical care time: 32 minutes Critical care time was exclusive of separately billable procedures and treating other patients and/or supervising APPs/Residents/Students Critical care was necessary to treat or prevent imminent or life-threatening deterioration. This patient is critically ill and at significant risk for neurological worsening and/or death and care requires constant monitoring. Critical care was time spent personally by me on the following activities: development of  treatment plan with patient and/or surrogate as well as nursing, discussions with consultants, evaluation of patient's response to treatment, examination of patient, obtaining history from patient or surrogate, ordering and performing treatments and interventions, ordering and review of laboratory studies, ordering and review of radiographic studies, pulse oximetry, re-evaluation of patient's condition, participation in multidisciplinary rounds and medical decision making of high complexity in the care of this patient.

## 2024-06-24 NOTE — Procedures (Signed)
 Arterial Catheter Insertion Procedure Note  Rita [name]  [id number]  November 03, 1966  Date:06/24/24  Time:3:30 PM    Provider Performing: Valinda Novas    Procedure: Insertion of Arterial Line (63379) with US  guidance (23062)   Indication(s) Blood pressure monitoring and/or need for frequent ABGs  Consent Risks of the procedure as well as the alternatives and risks of each were explained to the patient and/or caregiver.  Consent for the procedure was obtained and is signed in the bedside chart  Anesthesia None   Time Out Verified patient identification, verified procedure, site/side was marked, verified correct patient position, special equipment/implants available, medications/allergies/relevant history reviewed, required imaging and test results available.   Sterile Technique Maximal sterile technique including full sterile barrier drape, hand hygiene, sterile gown, sterile gloves, mask, hair covering, sterile ultrasound probe cover (if used).   Procedure Description Area of catheter insertion was cleaned with chlorhexidine  and draped in sterile fashion. With real-time ultrasound guidance an arterial catheter was placed into the right Axillary artery.  Appropriate arterial tracings confirmed on monitor.     Complications/Tolerance None; patient tolerated the procedure well.   EBL Minimal   Specimen(s) None

## 2024-06-24 NOTE — Progress Notes (Signed)
 Patient transported from 2H20 to MRI and back to 2H20 with no complications noted

## 2024-06-25 ENCOUNTER — Encounter (HOSPITAL_COMMUNITY): Payer: Self-pay | Admitting: Anesthesiology

## 2024-06-25 ENCOUNTER — Inpatient Hospital Stay (HOSPITAL_COMMUNITY)

## 2024-06-25 DIAGNOSIS — J9601 Acute respiratory failure with hypoxia: Secondary | ICD-10-CM | POA: Diagnosis not present

## 2024-06-25 DIAGNOSIS — R6520 Severe sepsis without septic shock: Secondary | ICD-10-CM | POA: Diagnosis not present

## 2024-06-25 DIAGNOSIS — A419 Sepsis, unspecified organism: Secondary | ICD-10-CM | POA: Diagnosis not present

## 2024-06-25 DIAGNOSIS — E8729 Other acidosis: Secondary | ICD-10-CM | POA: Diagnosis not present

## 2024-06-25 LAB — POCT I-STAT 7, (LYTES, BLD GAS, ICA,H+H)
Acid-base deficit: 11 mmol/L — ABNORMAL HIGH (ref 0.0–2.0)
Acid-base deficit: 5 mmol/L — ABNORMAL HIGH (ref 0.0–2.0)
Acid-base deficit: 6 mmol/L — ABNORMAL HIGH (ref 0.0–2.0)
Acid-base deficit: 7 mmol/L — ABNORMAL HIGH (ref 0.0–2.0)
Bicarbonate: 14.3 mmol/L — ABNORMAL LOW (ref 20.0–28.0)
Bicarbonate: 19.9 mmol/L — ABNORMAL LOW (ref 20.0–28.0)
Bicarbonate: 21 mmol/L (ref 20.0–28.0)
Bicarbonate: 21.2 mmol/L (ref 20.0–28.0)
Calcium, Ion: 0.99 mmol/L — ABNORMAL LOW (ref 1.15–1.40)
Calcium, Ion: 1 mmol/L — ABNORMAL LOW (ref 1.15–1.40)
Calcium, Ion: 1.02 mmol/L — ABNORMAL LOW (ref 1.15–1.40)
Calcium, Ion: 1.14 mmol/L — ABNORMAL LOW (ref 1.15–1.40)
HCT: 19 % — ABNORMAL LOW (ref 36.0–46.0)
HCT: 22 % — ABNORMAL LOW (ref 36.0–46.0)
HCT: 27 % — ABNORMAL LOW (ref 36.0–46.0)
HCT: 28 % — ABNORMAL LOW (ref 36.0–46.0)
Hemoglobin: 6.5 g/dL — CL (ref 12.0–15.0)
Hemoglobin: 7.5 g/dL — ABNORMAL LOW (ref 12.0–15.0)
Hemoglobin: 9.2 g/dL — ABNORMAL LOW (ref 12.0–15.0)
Hemoglobin: 9.5 g/dL — ABNORMAL LOW (ref 12.0–15.0)
O2 Saturation: 80 %
O2 Saturation: 92 %
O2 Saturation: 94 %
O2 Saturation: 95 %
Patient temperature: 36.8
Patient temperature: 97.6
Patient temperature: 97.8
Potassium: 3.4 mmol/L — ABNORMAL LOW (ref 3.5–5.1)
Potassium: 3.6 mmol/L (ref 3.5–5.1)
Potassium: 4 mmol/L (ref 3.5–5.1)
Potassium: 4.7 mmol/L (ref 3.5–5.1)
Sodium: 133 mmol/L — ABNORMAL LOW (ref 135–145)
Sodium: 138 mmol/L (ref 135–145)
Sodium: 139 mmol/L (ref 135–145)
Sodium: 139 mmol/L (ref 135–145)
TCO2: 15 mmol/L — ABNORMAL LOW (ref 22–32)
TCO2: 21 mmol/L — ABNORMAL LOW (ref 22–32)
TCO2: 22 mmol/L (ref 22–32)
TCO2: 23 mmol/L (ref 22–32)
pCO2 arterial: 28.7 mmHg — ABNORMAL LOW (ref 32–48)
pCO2 arterial: 38.3 mmHg (ref 32–48)
pCO2 arterial: 41.6 mmHg (ref 32–48)
pCO2 arterial: 50.1 mmHg — ABNORMAL HIGH (ref 32–48)
pH, Arterial: 7.228 — ABNORMAL LOW (ref 7.35–7.45)
pH, Arterial: 7.304 — ABNORMAL LOW (ref 7.35–7.45)
pH, Arterial: 7.314 — ABNORMAL LOW (ref 7.35–7.45)
pH, Arterial: 7.323 — ABNORMAL LOW (ref 7.35–7.45)
pO2, Arterial: 48 mmHg — ABNORMAL LOW (ref 83–108)
pO2, Arterial: 68 mmHg — ABNORMAL LOW (ref 83–108)
pO2, Arterial: 76 mmHg — ABNORMAL LOW (ref 83–108)
pO2, Arterial: 88 mmHg (ref 83–108)

## 2024-06-25 LAB — URINALYSIS, W/ REFLEX TO CULTURE (INFECTION SUSPECTED)
Bilirubin Urine: NEGATIVE
Glucose, UA: NEGATIVE mg/dL
Ketones, ur: NEGATIVE mg/dL
Leukocytes,Ua: NEGATIVE
Nitrite: NEGATIVE
Protein, ur: 30 mg/dL — AB
Specific Gravity, Urine: 1.013 (ref 1.005–1.030)
pH: 5 (ref 5.0–8.0)

## 2024-06-25 LAB — CBC WITH DIFFERENTIAL/PLATELET
Abs Immature Granulocytes: 1.1 10*3/uL — ABNORMAL HIGH (ref 0.00–0.07)
Abs Immature Granulocytes: 1.1 10*3/uL — ABNORMAL HIGH (ref 0.00–0.07)
Abs Immature Granulocytes: 0.79 10*3/uL — ABNORMAL HIGH (ref 0.00–0.07)
Abs Immature Granulocytes: 0.98 10*3/uL — ABNORMAL HIGH (ref 0.00–0.07)
Basophils Absolute: 0.1 10*3/uL (ref 0.0–0.1)
Basophils Absolute: 0.1 10*3/uL (ref 0.0–0.1)
Basophils Absolute: 0.1 10*3/uL (ref 0.0–0.1)
Basophils Absolute: 0.1 10*3/uL (ref 0.0–0.1)
Basophils Relative: 0 %
Basophils Relative: 0 %
Basophils Relative: 0 %
Basophils Relative: 0 %
Eosinophils Absolute: 0.1 10*3/uL (ref 0.0–0.5)
Eosinophils Absolute: 0.1 10*3/uL (ref 0.0–0.5)
Eosinophils Absolute: 0.1 10*3/uL (ref 0.0–0.5)
Eosinophils Absolute: 0.1 10*3/uL (ref 0.0–0.5)
Eosinophils Relative: 0 %
Eosinophils Relative: 0 %
Eosinophils Relative: 0 %
Eosinophils Relative: 0 %
HCT: 24.2 % — ABNORMAL LOW (ref 36.0–46.0)
HCT: 23.1 % — ABNORMAL LOW (ref 36.0–46.0)
HCT: 22.3 % — ABNORMAL LOW (ref 36.0–46.0)
HCT: 25.1 % — ABNORMAL LOW (ref 36.0–46.0)
Hemoglobin: 8 g/dL — ABNORMAL LOW (ref 12.0–15.0)
Hemoglobin: 7.9 g/dL — ABNORMAL LOW (ref 12.0–15.0)
Hemoglobin: 7.5 g/dL — ABNORMAL LOW (ref 12.0–15.0)
Hemoglobin: 8.4 g/dL — ABNORMAL LOW (ref 12.0–15.0)
Immature Granulocytes: 3 %
Immature Granulocytes: 4 %
Immature Granulocytes: 3 %
Immature Granulocytes: 3 %
Lymphocytes Relative: 6 %
Lymphocytes Relative: 5 %
Lymphocytes Relative: 4 %
Lymphocytes Relative: 5 %
Lymphs Abs: 2 10*3/uL (ref 0.7–4.0)
Lymphs Abs: 1.7 10*3/uL (ref 0.7–4.0)
Lymphs Abs: 1.1 10*3/uL (ref 0.7–4.0)
Lymphs Abs: 1.5 10*3/uL (ref 0.7–4.0)
MCH: 30.4 pg (ref 26.0–34.0)
MCH: 30.7 pg (ref 26.0–34.0)
MCH: 30.4 pg (ref 26.0–34.0)
MCH: 29.8 pg (ref 26.0–34.0)
MCHC: 33.1 g/dL (ref 30.0–36.0)
MCHC: 34.2 g/dL (ref 30.0–36.0)
MCHC: 33.6 g/dL (ref 30.0–36.0)
MCHC: 33.5 g/dL (ref 30.0–36.0)
MCV: 92 fL (ref 80.0–100.0)
MCV: 89.9 fL (ref 80.0–100.0)
MCV: 90.3 fL (ref 80.0–100.0)
MCV: 89 fL (ref 80.0–100.0)
Monocytes Absolute: 2.4 10*3/uL — ABNORMAL HIGH (ref 0.1–1.0)
Monocytes Absolute: 2.2 10*3/uL — ABNORMAL HIGH (ref 0.1–1.0)
Monocytes Absolute: 1.6 10*3/uL — ABNORMAL HIGH (ref 0.1–1.0)
Monocytes Absolute: 2.1 10*3/uL — ABNORMAL HIGH (ref 0.1–1.0)
Monocytes Relative: 7 %
Monocytes Relative: 7 %
Monocytes Relative: 6 %
Monocytes Relative: 7 %
Neutro Abs: 30.1 10*3/uL — ABNORMAL HIGH (ref 1.7–7.7)
Neutro Abs: 26 10*3/uL — ABNORMAL HIGH (ref 1.7–7.7)
Neutro Abs: 23.1 10*3/uL — ABNORMAL HIGH (ref 1.7–7.7)
Neutro Abs: 24.3 10*3/uL — ABNORMAL HIGH (ref 1.7–7.7)
Neutrophils Relative %: 84 %
Neutrophils Relative %: 84 %
Neutrophils Relative %: 87 %
Neutrophils Relative %: 85 %
Platelets: 339 10*3/uL (ref 150–400)
Platelets: 335 10*3/uL (ref 150–400)
Platelets: 294 10*3/uL (ref 150–400)
Platelets: 291 10*3/uL (ref 150–400)
RBC: 2.63 MIL/uL — ABNORMAL LOW (ref 3.87–5.11)
RBC: 2.57 MIL/uL — ABNORMAL LOW (ref 3.87–5.11)
RBC: 2.47 MIL/uL — ABNORMAL LOW (ref 3.87–5.11)
RBC: 2.82 MIL/uL — ABNORMAL LOW (ref 3.87–5.11)
RDW: 16.5 % — ABNORMAL HIGH (ref 11.5–15.5)
RDW: 16.4 % — ABNORMAL HIGH (ref 11.5–15.5)
RDW: 16.6 % — ABNORMAL HIGH (ref 11.5–15.5)
RDW: 17.2 % — ABNORMAL HIGH (ref 11.5–15.5)
Smear Review: NORMAL
Smear Review: NORMAL
WBC: 35.8 10*3/uL — ABNORMAL HIGH (ref 4.0–10.5)
WBC: 31.1 10*3/uL — ABNORMAL HIGH (ref 4.0–10.5)
WBC: 26.7 10*3/uL — ABNORMAL HIGH (ref 4.0–10.5)
WBC: 28.9 10*3/uL — ABNORMAL HIGH (ref 4.0–10.5)
nRBC: 0.1 % (ref 0.0–0.2)
nRBC: 0.2 % (ref 0.0–0.2)
nRBC: 0.1 % (ref 0.0–0.2)
nRBC: 0.3 % — ABNORMAL HIGH (ref 0.0–0.2)

## 2024-06-25 LAB — CULTURE, BLOOD (ROUTINE X 2)

## 2024-06-25 LAB — BILIRUBIN, DIRECT
Bilirubin, Direct: <0.1 mg/dL (ref 0.0–0.2)
Bilirubin, Direct: 0.1 mg/dL (ref 0.0–0.2)
Bilirubin, Direct: 0.1 mg/dL (ref 0.0–0.2)
Bilirubin, Direct: <0.1 mg/dL (ref 0.0–0.2)

## 2024-06-25 LAB — COMPREHENSIVE METABOLIC PANEL WITH GFR
ALT: 139 U/L — ABNORMAL HIGH (ref 0–44)
ALT: 132 U/L — ABNORMAL HIGH (ref 0–44)
ALT: 117 U/L — ABNORMAL HIGH (ref 0–44)
ALT: 108 U/L — ABNORMAL HIGH (ref 0–44)
AST: 66 U/L — ABNORMAL HIGH (ref 15–41)
AST: 60 U/L — ABNORMAL HIGH (ref 15–41)
AST: 52 U/L — ABNORMAL HIGH (ref 15–41)
AST: 48 U/L — ABNORMAL HIGH (ref 15–41)
Albumin: <1.5 g/dL — ABNORMAL LOW (ref 3.5–5.0)
Albumin: <1.5 g/dL — ABNORMAL LOW (ref 3.5–5.0)
Albumin: <1.5 g/dL — ABNORMAL LOW (ref 3.5–5.0)
Albumin: <1.5 g/dL — ABNORMAL LOW (ref 3.5–5.0)
Alkaline Phosphatase: 121 U/L (ref 38–126)
Alkaline Phosphatase: 134 U/L — ABNORMAL HIGH (ref 38–126)
Alkaline Phosphatase: 137 U/L — ABNORMAL HIGH (ref 38–126)
Alkaline Phosphatase: 145 U/L — ABNORMAL HIGH (ref 38–126)
Anion gap: 14 (ref 5–15)
Anion gap: 18 — ABNORMAL HIGH (ref 5–15)
Anion gap: 18 — ABNORMAL HIGH (ref 5–15)
Anion gap: 17 — ABNORMAL HIGH (ref 5–15)
BUN: 86 mg/dL — ABNORMAL HIGH (ref 6–20)
BUN: 89 mg/dL — ABNORMAL HIGH (ref 6–20)
BUN: 87 mg/dL — ABNORMAL HIGH (ref 6–20)
BUN: 84 mg/dL — ABNORMAL HIGH (ref 6–20)
CO2: 24 mmol/L (ref 22–32)
CO2: 19 mmol/L — ABNORMAL LOW (ref 22–32)
CO2: 18 mmol/L — ABNORMAL LOW (ref 22–32)
CO2: 19 mmol/L — ABNORMAL LOW (ref 22–32)
Calcium: 7.1 mg/dL — ABNORMAL LOW (ref 8.9–10.3)
Calcium: 7.2 mg/dL — ABNORMAL LOW (ref 8.9–10.3)
Calcium: 8 mg/dL — ABNORMAL LOW (ref 8.9–10.3)
Calcium: 7.8 mg/dL — ABNORMAL LOW (ref 8.9–10.3)
Chloride: 101 mmol/L (ref 98–111)
Chloride: 103 mmol/L (ref 98–111)
Chloride: 103 mmol/L (ref 98–111)
Chloride: 102 mmol/L (ref 98–111)
Creatinine, Ser: 4.53 mg/dL — ABNORMAL HIGH (ref 0.44–1.00)
Creatinine, Ser: 4.38 mg/dL — ABNORMAL HIGH (ref 0.44–1.00)
Creatinine, Ser: 4.29 mg/dL — ABNORMAL HIGH (ref 0.44–1.00)
Creatinine, Ser: 4.24 mg/dL — ABNORMAL HIGH (ref 0.44–1.00)
GFR, Estimated: 11 mL/min — ABNORMAL LOW (ref >60)
GFR, Estimated: 11 mL/min — ABNORMAL LOW (ref >60)
GFR, Estimated: 11 mL/min — ABNORMAL LOW (ref >60)
GFR, Estimated: 12 mL/min — ABNORMAL LOW (ref >60)
Glucose, Bld: 128 mg/dL — ABNORMAL HIGH (ref 70–99)
Glucose, Bld: 122 mg/dL — ABNORMAL HIGH (ref 70–99)
Glucose, Bld: 115 mg/dL — ABNORMAL HIGH (ref 70–99)
Glucose, Bld: 118 mg/dL — ABNORMAL HIGH (ref 70–99)
Potassium: 3.6 mmol/L (ref 3.5–5.1)
Potassium: 3.4 mmol/L — ABNORMAL LOW (ref 3.5–5.1)
Potassium: 4.1 mmol/L (ref 3.5–5.1)
Potassium: 3.7 mmol/L (ref 3.5–5.1)
Sodium: 139 mmol/L (ref 135–145)
Sodium: 140 mmol/L (ref 135–145)
Sodium: 139 mmol/L (ref 135–145)
Sodium: 138 mmol/L (ref 135–145)
Total Bilirubin: 0.5 mg/dL (ref 0.0–1.2)
Total Bilirubin: 0.5 mg/dL (ref 0.0–1.2)
Total Bilirubin: 0.9 mg/dL (ref 0.0–1.2)
Total Bilirubin: 0.6 mg/dL (ref 0.0–1.2)
Total Protein: 4.5 g/dL — ABNORMAL LOW (ref 6.5–8.1)
Total Protein: 4.7 g/dL — ABNORMAL LOW (ref 6.5–8.1)
Total Protein: 4.6 g/dL — ABNORMAL LOW (ref 6.5–8.1)
Total Protein: 4.7 g/dL — ABNORMAL LOW (ref 6.5–8.1)

## 2024-06-25 LAB — MAGNESIUM
Magnesium: 2.2 mg/dL (ref 1.7–2.4)
Magnesium: 2.2 mg/dL (ref 1.7–2.4)
Magnesium: 2.3 mg/dL (ref 1.7–2.4)
Magnesium: 2.2 mg/dL (ref 1.7–2.4)

## 2024-06-25 LAB — APTT
aPTT: 30 s (ref 24–36)
aPTT: 31 s (ref 24–36)
aPTT: 36 s (ref 24–36)
aPTT: 39 s — ABNORMAL HIGH (ref 24–36)

## 2024-06-25 LAB — RENAL FUNCTION PANEL
Albumin: <1.5 g/dL — ABNORMAL LOW (ref 3.5–5.0)
Anion gap: 15 (ref 5–15)
BUN: 86 mg/dL — ABNORMAL HIGH (ref 6–20)
CO2: 20 mmol/L — ABNORMAL LOW (ref 22–32)
Calcium: 7.1 mg/dL — ABNORMAL LOW (ref 8.9–10.3)
Chloride: 104 mmol/L (ref 98–111)
Creatinine, Ser: 4.4 mg/dL — ABNORMAL HIGH (ref 0.44–1.00)
GFR, Estimated: 11 mL/min — ABNORMAL LOW (ref >60)
Glucose, Bld: 120 mg/dL — ABNORMAL HIGH (ref 70–99)
Phosphorus: 10.4 mg/dL — ABNORMAL HIGH (ref 2.5–4.6)
Potassium: 3.4 mmol/L — ABNORMAL LOW (ref 3.5–5.1)
Sodium: 139 mmol/L (ref 135–145)

## 2024-06-25 LAB — LIPASE, BLOOD
Lipase: 143 U/L — ABNORMAL HIGH (ref 11–51)
Lipase: 211 U/L — ABNORMAL HIGH (ref 11–51)
Lipase: 202 U/L — ABNORMAL HIGH (ref 11–51)
Lipase: 209 U/L — ABNORMAL HIGH (ref 11–51)

## 2024-06-25 LAB — CK TOTAL AND CKMB (NOT AT ARMC)
CK, MB: 11.7 ng/mL — ABNORMAL HIGH (ref 0.5–5.0)
CK, MB: 10.4 ng/mL — ABNORMAL HIGH (ref 0.5–5.0)
CK, MB: 10.9 ng/mL — ABNORMAL HIGH (ref 0.5–5.0)
CK, MB: 6.9 ng/mL — ABNORMAL HIGH (ref 0.5–5.0)
Total CK: 144 U/L (ref 38–234)
Total CK: 115 U/L (ref 38–234)
Total CK: 89 U/L (ref 38–234)
Total CK: 61 U/L (ref 38–234)

## 2024-06-25 LAB — AMYLASE
Amylase: 93 U/L (ref 28–100)
Amylase: 120 U/L — ABNORMAL HIGH (ref 28–100)
Amylase: 116 U/L — ABNORMAL HIGH (ref 28–100)
Amylase: 107 U/L — ABNORMAL HIGH (ref 28–100)

## 2024-06-25 LAB — URINE CULTURE: Culture: NO GROWTH

## 2024-06-25 LAB — GLUCOSE, CAPILLARY
Glucose-Capillary: 114 mg/dL — ABNORMAL HIGH (ref 70–99)
Glucose-Capillary: 137 mg/dL — ABNORMAL HIGH (ref 70–99)
Glucose-Capillary: 107 mg/dL — ABNORMAL HIGH (ref 70–99)
Glucose-Capillary: 126 mg/dL — ABNORMAL HIGH (ref 70–99)
Glucose-Capillary: 85 mg/dL (ref 70–99)

## 2024-06-25 LAB — PHOSPHORUS
Phosphorus: 11.1 mg/dL — ABNORMAL HIGH (ref 2.5–4.6)
Phosphorus: 10.1 mg/dL — ABNORMAL HIGH (ref 2.5–4.6)
Phosphorus: 10.1 mg/dL — ABNORMAL HIGH (ref 2.5–4.6)
Phosphorus: 9.2 mg/dL — ABNORMAL HIGH (ref 2.5–4.6)

## 2024-06-25 LAB — PREPARE RBC (CROSSMATCH)

## 2024-06-25 LAB — PROTIME-INR
INR: 1.3 — ABNORMAL HIGH (ref 0.8–1.2)
INR: 1.3 — ABNORMAL HIGH (ref 0.8–1.2)
INR: 1.3 — ABNORMAL HIGH (ref 0.8–1.2)
INR: 1.3 — ABNORMAL HIGH (ref 0.8–1.2)
Prothrombin Time: 16.8 s — ABNORMAL HIGH (ref 11.4–15.2)
Prothrombin Time: 17.1 s — ABNORMAL HIGH (ref 11.4–15.2)
Prothrombin Time: 17.1 s — ABNORMAL HIGH (ref 11.4–15.2)
Prothrombin Time: 17.1 s — ABNORMAL HIGH (ref 11.4–15.2)

## 2024-06-25 LAB — TROPONIN I (HIGH SENSITIVITY)
Troponin I (High Sensitivity): 643 ng/L (ref <18)
Troponin I (High Sensitivity): 333 ng/L (ref <18)
Troponin I (High Sensitivity): 252 ng/L (ref <18)
Troponin I (High Sensitivity): 300 ng/L (ref <18)

## 2024-06-25 LAB — FIBRINOGEN
Fibrinogen: >800 mg/dL — ABNORMAL HIGH (ref 210–475)
Fibrinogen: >800 mg/dL — ABNORMAL HIGH (ref 210–475)
Fibrinogen: >800 mg/dL — ABNORMAL HIGH (ref 210–475)
Fibrinogen: >800 mg/dL — ABNORMAL HIGH (ref 210–475)

## 2024-06-25 MED ORDER — POTASSIUM CHLORIDE 10 MEQ/50ML IV SOLN
10.0000 meq | INTRAVENOUS | Status: AC
  Administered 2024-06-25 (×4): 10 meq via INTRAVENOUS
  Filled 2024-06-25 (×4): qty 50

## 2024-06-25 MED ORDER — CALCIUM GLUCONATE-NACL 2-0.675 GM/100ML-% IV SOLN
2.0000 g | INTRAVENOUS | Status: AC
  Administered 2024-06-25 (×2): 2000 mg via INTRAVENOUS
  Filled 2024-06-25 (×2): qty 100

## 2024-06-25 MED ORDER — SODIUM CHLORIDE 0.9 % IV SOLN: 4.0000 g | Freq: Once | INTRAVENOUS | Status: DC

## 2024-06-25 MED ORDER — VANCOMYCIN HCL IN DEXTROSE 1-5 GM/200ML-% IV SOLN
1000.0000 mg | Freq: Once | INTRAVENOUS | Status: AC
  Administered 2024-06-25: 1000 mg via INTRAVENOUS
  Filled 2024-06-25: qty 200

## 2024-06-25 MED ORDER — SODIUM CHLORIDE 0.9% IV SOLUTION: Freq: Once | INTRAVENOUS | Status: AC

## 2024-06-25 NOTE — Progress Notes (Signed)
 Pharmacy Antibiotic Note  Rita [name] is a 58 y.o. female admitted on 06/21/2024 with VDRF/sepsis/PNA.  Pharmacy has been consulted for Vancomycin  dosing.  -WBC= 31, afebrile -SCr 4.4 (baseline < 1.0); UOP 1875 -blood cultures with MRSE -vancomycin  random level= 22 at ~ 10pm (last dose 1000mg  in 6/29)  Plan: Vancomycin  1000 mg IV x1 Will check a random vancomycin  level on 7/3 in am Continue Zosyn 3.375 gm IV every 8 hours F/U renal function and clinical progression  Height: 5' 7 (170.2 cm) Weight: 102.1 kg (225 lb 1.4 oz) IBW/kg (Calculated) : 61.6  Temp (24hrs), Avg:97.7 F (36.5 C), Min:96.6 F (35.9 C), Max:98.4 F (36.9 C)  Recent Labs  Lab 06/21/24 0611 06/21/24 0722 06/21/24 0845 06/21/24 1539 06/21/24 2138 06/21/24 2140 06/22/24 0431 06/23/24 0331 06/23/24 0958 06/24/24 0418 06/24/24 1555 06/24/24 1711 06/24/24 2147 06/25/24 0013 06/25/24 0521  WBC  --    < >  --  27.6*   < >  --    < > 37.6*  --  37.4*  --  39.3*  --  35.8* 31.1*  CREATININE  --    < >  --  2.26*   < >  --    < > 3.74*  --  4.49*  --  4.60*  --  4.53* 4.38*  4.40*  LATICACIDVEN 8.8*  --  >9.0* >9.0*  --  1.8  --   --   --   --  1.7  --   --   --   --   VANCORANDOM  --   --   --   --   --   --   --   --  17  --   --   --  22  --   --    < > = values in this interval not displayed.    Estimated Creatinine Clearance: 17.1 mL/min (A) (by C-G formula based on SCr of 4.4 mg/dL (H)).    No Known Allergies  Antibiotics this admission: Azithromycin 6/27 > 6/28 Cefepime 6/27 > 6/28 Ceftriaxone  6/27 x1 Vancomycin  6/27 >> Zosyn 6/28 >>  Blood cultures this admission: 6/27 RVP: negative 6/27 C. Diff: negative 6/27 MRSA: negative 6/28 Bcx: GPCs in 2/4 bottles - MRSE  Prentice Poisson, PharmD Clinical Pharmacist **Pharmacist phone directory can now be found on [email or web address] ([email or web address]).  Listed under [facility].

## 2024-06-25 NOTE — Progress Notes (Signed)
 NAME:  Rita [name], MRN:  [id number], DOB:  May 20, 1966, LOS: 4 ADMISSION DATE:  06/21/2024, CONSULTATION DATE:  06/21/2024 REFERRING MD:  Pearlean Manus, MD, CHIEF COMPLAINT:  Sepsis  History of Present Illness:  60 yu/o female with PMH for PAD, Cociane and THC abuse, HLD, HTN who recently suffered a left MCA stroke who presented with found unresponsive with diarrhea, CT abd showing paralytic ileus and enterocolitis with with heme positive stools significant abdominal discomfort with abdominal distention and persistent nausea and dry heaving... -NG tube placement attempted unsuccessfully. She had episode of bradycardia and rates in the 20's.  Patient had a weak pulse and received Atropine x 2, but then lost her pulse and possibly had V fib/Vtach and then had PEA.  and CPR initiated and given Epinephrine.  ROCS achieved but she was intubated by Anesthesia.   She was hemoccult positive.  She was ordered for 2 units PRBC. Post code cxr showing a right hydropneumothorax and rib fracture possibly during CPR.   Pertinent  Medical History  Arthritis, Barrett's esophagus with esophagitis (03/26/2013), Chronic abdominal pain, Chronic lower back pain, COPD (chronic obstructive pulmonary disease) (HCC), Depression, Dyspnea, GERD (gastroesophageal reflux disease), H/O acute pancreatitis, Heart murmur, Hiatal hernia, High cholesterol, Hypertension, Peptic ulcer, Pneumonia (2000s X 1), Stroke (HCC), and Tobacco use (03/26/2013).    Significant Hospital Events: Including procedures, antibiotic start and stop dates in addition to other pertinent events   6/27: Admit to ICU 6/28 continue to have myoclonus, on high-dose propofol  6/29 triglyceride went up, propofol  was decreased, placed on Versed  6/30 MRI brain>> 1. Expected evolution of extensive Left MCA territory infarct. Laminar necrosis, developing encephalomalacia, mild petechial hemorrhage. No intracranial mass effect.  2. No new intracranial  abnormality. Advanced chronic ischemic disease also in the left PCA and bilateral cerebellar artery territories.  Interim History / Subjective:  No acute change overnight  Remains on pressors Hemodynamically stable  Objective    Blood pressure (!) 107/54, pulse (!) 59, temperature (!) 96.6 F (35.9 C), temperature source Axillary, resp. rate (!) 25, height 5' 7 (1.702 m), weight 102.1 kg, last menstrual period 02/09/2012, SpO2 95%.    Vent Mode: PRVC FiO2 (%):  [40 %] 40 % Set Rate:  [18 bmp-25 bmp] 25 bmp Vt Set:  [490 mL] 490 mL PEEP:  [10 cmH20] 10 cmH20 Plateau Pressure:  [22 cmH20-23 cmH20] 23 cmH20   Intake/Output Summary (Last 24 hours) at 06/25/2024 0851 Last data filed at 06/25/2024 0830 Gross per 24 hour  Intake 2715.43 ml  Output 1800 ml  Net 915.43 ml   Filed Weights   06/23/24 0537 06/24/24 0429 06/25/24 0500  Weight: 103.4 kg 104.9 kg 102.1 kg    Examination: General: Crtitically ill-appearing middle-age female, orally intubated HEENT: [place]/AT, eyes anicteric.  ETT and OGT in place Neuro: on low dose versed  gtt, ketamine , no obvious seizure activity or myoclonus this morning  Chest: resps even non labored on vent, not breathing over vent rate, chest tube to suction  Heart: Regular rate and rhythm, no murmurs or gallops Abdomen: Soft, nondistended, bowel sounds present  Labs and images reviewed  Patient Lines/Drains/Airways Status     Active Line/Drains/Airways     Name Placement date Placement time Site Days   Peripheral IV 06/21/24 22 G Anterior;Right Forearm 06/21/24  0806  Forearm  3   CVC Triple Lumen 06/21/24 Right Femoral 06/21/24  0718  -- 3   Chest Tube 1 Lateral;Right Pleural 06/21/24  2209  Pleural  3   NG/OG Vented/Dual Lumen 14 Fr. Left nare Marking at nare/corner of mouth 60 cm 06/21/24  2236  Left nare  3   Urethral Catheter Almarie, NT Latex;Temperature probe 14 Fr. 06/21/24  9373  Latex;Temperature probe  3   Fecal Management System 45 mL  06/21/24  1847  -- 3   Airway 7.5 mm 06/21/24  1530  -- 3        Resolved problem list   Assessment and Plan  Status post PEA cardiac arrest Status myoclonus Acute encephalopathy, likely hypoxic/anoxic Acute respiratory failure with hypoxia and hypercapnia Severe sepsis with septic shock due to bilateral lower lobe pneumonia, POA MRSE bacteremia Right-sided pneumothorax Bilateral pleural effusion status post right-sided chest tube placement Acute metabolic acidosis/lactic acidosis Acute kidney injury due to ischemic ATN in the setting of cardiac arrest, worsening Shock liver Hyponatremia/hypocalcemia, resolved Hypophosphatemia Acute GI bleeding Acute blood loss anemia Recent history of stroke with right-sided weakness Propofol  induced hypertriglyceridemia, improving  Continue TTM and supportive care Continue to have stimulus induced muscle twitching Neurology is following Continue versed  gtt, ketamine  per neuro  Propofol  was stopped due to hypertriglyceridemia Continue depakote, keppra  VAP prevention bundle in place PAD protocol with Versed  and ketamine  Continue to require vasopressor support currently on levophed, vasopressin  Continue to titrate with MAP goal 65 Continue chest tube on suction, draining dark fluid Continue broad-spectrum antibiotics with vancomycin  and Zosyn Scr trending down slowly  Holding diuretics Closely monitor and supplement electrolytes H&H is stable Replete K  F/u chem   Best Practice (right click and Reselect all SmartList Selections daily)   Diet/type: NPO  DVT prophylaxis prophylactic heparin   Pressure ulcer(s): Please refer to nursing notes GI prophylaxis: PPI Lines: Right Femoral Central Line Foley: Still needed Code Status: DNR Goals of care discussion: 6/28  Labs   CBC: Recent Labs  Lab 06/23/24 0331 06/24/24 0418 06/24/24 1639 06/24/24 1711 06/25/24 0013 06/25/24 0521 06/25/24 0558  WBC 37.6* 37.4*  --  39.3*  35.8* 31.1*  --   NEUTROABS 33.6* 33.3*  --  32.1* 30.1* 26.0*  --   HGB 10.0* 9.3* 8.5* 8.6* 8.0*  9.2* 7.9* 7.5*  HCT 28.9* 28.1* 25.0* 26.2* 24.2*  27.0* 23.1* 22.0*  MCV 92.6 90.9  --  93.2 92.0 89.9  --   PLT 404* 381  --  362 339 335  --     Basic Metabolic Panel: Recent Labs  Lab 06/23/24 0331 06/24/24 0418 06/24/24 1639 06/24/24 1711 06/25/24 0013 06/25/24 0521 06/25/24 0558  NA 133* 136 137 137 139  139 140  139 139  K 4.4 4.3 3.9 4.0 3.6  3.6 3.4*  3.4* 3.4*  CL 97* 97*  --  101 101 103  104  --   CO2 18* 19*  --  18* 24 19*  20*  --   GLUCOSE 205* 164*  --  136* 128* 122*  120*  --   BUN 81* 83*  --  89* 86* 89*  86*  --   CREATININE 3.74* 4.49*  --  4.60* 4.53* 4.38*  4.40*  --   CALCIUM  7.2* 7.4*  --  7.2* 7.1* 7.2*  7.1*  --   MG 2.4 2.3  --  2.3 2.2 2.2  --   PHOS 7.3* 9.3*  --  11.2* 11.1* 10.1*  10.4*  --    GFR: Estimated Creatinine Clearance: 17.1 mL/min (A) (by C-G formula based on SCr of 4.4 mg/dL (H)). Recent Labs  Lab 06/21/24 0845 06/21/24 1539 06/21/24 2138 06/21/24 2140 06/22/24 0431 06/24/24 0418 06/24/24 1555 06/24/24 1711 06/25/24 0013 06/25/24 0521  WBC  --  27.6*   < >  --    < > 37.4*  --  39.3* 35.8* 31.1*  LATICACIDVEN >9.0* >9.0*  --  1.8  --   --  1.7  --   --   --    < > = values in this interval not displayed.    Liver Function Tests: Recent Labs  Lab 06/22/24 0431 06/23/24 0331 06/23/24 0800 06/24/24 0418 06/24/24 1555 06/24/24 1711 06/25/24 0013 06/25/24 0521  AST 175*  --  145*  --   --  85* 66* 60*  ALT 137*  --  172*  --   --  160* 139* 132*  ALKPHOS 40  --  63  --   --  149* 121 134*  BILITOT 0.9  --  0.4  --   --  0.4 0.5 0.5  PROT 4.0*  --  4.3*  --   --  4.7* 4.5* 4.7*  ALBUMIN 1.9*   < > 1.6* <1.5* <1.5* <1.5* <1.5* <1.5*  <1.5*   < > = values in this interval not displayed.   Recent Labs  Lab 06/24/24 1711 06/25/24 0013 06/25/24 0521  LIPASE 77* 143* 211*  AMYLASE 82 93 120*    Recent Labs  Lab 06/21/24 0722  AMMONIA <13    ABG    Component Value Date/Time   PHART 7.314 (L) 06/25/2024 0558   PCO2ART 41.6 06/25/2024 0558   PO2ART 68 (L) 06/25/2024 0558   HCO3 21.2 06/25/2024 0558   TCO2 22 06/25/2024 0558   ACIDBASEDEF 5.0 (H) 06/25/2024 0558   O2SAT 92 06/25/2024 0558     Coagulation Profile: Recent Labs  Lab 06/21/24 2138 06/22/24 0431 06/24/24 1711 06/25/24 0013 06/25/24 0521  INR 1.4* 1.4* 1.4* 1.3* 1.3*    Cardiac Enzymes: Recent Labs  Lab 06/24/24 1711 06/25/24 0013 06/25/24 0521  CKTOTAL 181 144 115  CKMB 22.5* 11.7* 10.4*    HbA1C: Hgb A1c MFr Bld  Date/Time Value Ref Range Status  06/24/2024 05:34 PM 5.5 4.8 - 5.6 % Final    Comment:    (NOTE) Diagnosis of Diabetes The following HbA1c ranges recommended by the American Diabetes Association (ADA) may be used as an aid in the diagnosis of diabetes mellitus.  Hemoglobin             Suggested A1C NGSP%              Diagnosis  <5.7                   Non Diabetic  5.7-6.4                Pre-Diabetic  >6.4                   Diabetic  <7.0                   Glycemic control for                       adults with diabetes.    05/27/2024 08:11 PM 6.3 (H) 4.8 - 5.6 % Final    Comment:    (NOTE) Diagnosis of Diabetes The following HbA1c ranges recommended by the American Diabetes Association (ADA) may be used as an aid in the diagnosis of diabetes mellitus.  Hemoglobin  Suggested A1C NGSP%              Diagnosis  <5.7                   Non Diabetic  5.7-6.4                Pre-Diabetic  >6.4                   Diabetic  <7.0                   Glycemic control for                       adults with diabetes.      CBG: Recent Labs  Lab 06/24/24 1602 06/24/24 1934 06/24/24 2306 06/25/24 0346 06/25/24 0728  GLUCAP 129* 122* 124* 114* 137*     The patient is critically ill due to severe sepsis with septic shock/acute respiratory  failure/cardiac arrest.  Critical care was necessary to treat or prevent imminent or life-threatening deterioration.  Critical care was time spent personally by me on the following activities: development of treatment plan with patient and/or surrogate as well as nursing, discussions with consultants, evaluation of patient's response to treatment, examination of patient, obtaining history from patient or surrogate, ordering and performing treatments and interventions, ordering and review of laboratory studies, ordering and review of radiographic studies, pulse oximetry, re-evaluation of patient's condition and participation in multidisciplinary rounds.   During this encounter critical care time was devoted to patient care services described in this note for 33 minutes.  Rockie Myers, NP Pulmonary/Critical Care Medicine  06/25/2024  8:51 AM   See Tracey for personal pager PCCM on call pager [phone] until 7pm. Please call Elink 7p-7a. [phone]

## 2024-06-25 NOTE — Progress Notes (Signed)
 PCCM notified for small bloody outpt from OG. Hgb on istat 6.5 earlier receiving 1 unit PRBC. Continue PPI BID. Hold subcu heparin . F/u post transfusion h/h.  RODGER Cedar, PA-C [facility] 06/25/2024, 4:25 PM  Please see [email or web address] for pager details.  From 7A-7P if no response, please call [phone]. After hours, please call ELink [phone].

## 2024-06-25 DEATH — deceased

## 2024-06-26 ENCOUNTER — Inpatient Hospital Stay (HOSPITAL_COMMUNITY)

## 2024-06-26 ENCOUNTER — Encounter (HOSPITAL_COMMUNITY): Admission: EM | Disposition: E | Payer: Self-pay | Source: Home / Self Care | Attending: Pulmonary Disease

## 2024-06-26 ENCOUNTER — Other Ambulatory Visit: Payer: Self-pay

## 2024-06-26 DIAGNOSIS — R6520 Severe sepsis without septic shock: Secondary | ICD-10-CM | POA: Diagnosis not present

## 2024-06-26 DIAGNOSIS — A419 Sepsis, unspecified organism: Secondary | ICD-10-CM | POA: Diagnosis not present

## 2024-06-26 DIAGNOSIS — J9601 Acute respiratory failure with hypoxia: Secondary | ICD-10-CM | POA: Diagnosis not present

## 2024-06-26 DIAGNOSIS — E8729 Other acidosis: Secondary | ICD-10-CM | POA: Diagnosis not present

## 2024-06-26 HISTORY — PX: ORGAN PROCUREMENT: SHX5270

## 2024-06-26 LAB — GLUCOSE, CAPILLARY
Glucose-Capillary: 131 mg/dL — ABNORMAL HIGH (ref 70–99)
Glucose-Capillary: 69 mg/dL — ABNORMAL LOW (ref 70–99)
Glucose-Capillary: 72 mg/dL (ref 70–99)
Glucose-Capillary: 79 mg/dL (ref 70–99)
Glucose-Capillary: 81 mg/dL (ref 70–99)
Glucose-Capillary: 82 mg/dL (ref 70–99)

## 2024-06-26 LAB — CBC WITH DIFFERENTIAL/PLATELET
Abs Immature Granulocytes: 1.04 10*3/uL — ABNORMAL HIGH (ref 0.00–0.07)
Abs Immature Granulocytes: 1.13 10*3/uL — ABNORMAL HIGH (ref 0.00–0.07)
Abs Immature Granulocytes: 1.18 10*3/uL — ABNORMAL HIGH (ref 0.00–0.07)
Basophils Absolute: 0.1 10*3/uL (ref 0.0–0.1)
Basophils Absolute: 0.1 10*3/uL (ref 0.0–0.1)
Basophils Absolute: 0.1 10*3/uL (ref 0.0–0.1)
Basophils Relative: 0 %
Basophils Relative: 0 %
Basophils Relative: 0 %
Eosinophils Absolute: 0 10*3/uL (ref 0.0–0.5)
Eosinophils Absolute: 0.1 10*3/uL (ref 0.0–0.5)
Eosinophils Absolute: 0.1 10*3/uL (ref 0.0–0.5)
Eosinophils Relative: 0 %
Eosinophils Relative: 0 %
Eosinophils Relative: 0 %
HCT: 22.6 % — ABNORMAL LOW (ref 36.0–46.0)
HCT: 23.6 % — ABNORMAL LOW (ref 36.0–46.0)
HCT: 24 % — ABNORMAL LOW (ref 36.0–46.0)
Hemoglobin: 7.5 g/dL — ABNORMAL LOW (ref 12.0–15.0)
Hemoglobin: 7.8 g/dL — ABNORMAL LOW (ref 12.0–15.0)
Hemoglobin: 8 g/dL — ABNORMAL LOW (ref 12.0–15.0)
Immature Granulocytes: 4 %
Immature Granulocytes: 5 %
Immature Granulocytes: 5 %
Lymphocytes Relative: 4 %
Lymphocytes Relative: 4 %
Lymphocytes Relative: 5 %
Lymphs Abs: 1 10*3/uL (ref 0.7–4.0)
Lymphs Abs: 1 10*3/uL (ref 0.7–4.0)
Lymphs Abs: 1.2 10*3/uL (ref 0.7–4.0)
MCH: 29.2 pg (ref 26.0–34.0)
MCH: 29.5 pg (ref 26.0–34.0)
MCH: 29.6 pg (ref 26.0–34.0)
MCHC: 33.1 g/dL (ref 30.0–36.0)
MCHC: 33.2 g/dL (ref 30.0–36.0)
MCHC: 33.3 g/dL (ref 30.0–36.0)
MCV: 88.4 fL (ref 80.0–100.0)
MCV: 88.9 fL (ref 80.0–100.0)
MCV: 89 fL (ref 80.0–100.0)
Monocytes Absolute: 2.2 10*3/uL — ABNORMAL HIGH (ref 0.1–1.0)
Monocytes Absolute: 2.3 10*3/uL — ABNORMAL HIGH (ref 0.1–1.0)
Monocytes Absolute: 2.3 10*3/uL — ABNORMAL HIGH (ref 0.1–1.0)
Monocytes Relative: 10 %
Monocytes Relative: 10 %
Monocytes Relative: 9 %
Neutro Abs: 18.3 10*3/uL — ABNORMAL HIGH (ref 1.7–7.7)
Neutro Abs: 19.4 10*3/uL — ABNORMAL HIGH (ref 1.7–7.7)
Neutro Abs: 19.6 10*3/uL — ABNORMAL HIGH (ref 1.7–7.7)
Neutrophils Relative %: 81 %
Neutrophils Relative %: 81 %
Neutrophils Relative %: 82 %
Platelets: 242 10*3/uL (ref 150–400)
Platelets: 266 10*3/uL (ref 150–400)
Platelets: 278 10*3/uL (ref 150–400)
RBC: 2.54 MIL/uL — ABNORMAL LOW (ref 3.87–5.11)
RBC: 2.67 MIL/uL — ABNORMAL LOW (ref 3.87–5.11)
RBC: 2.7 MIL/uL — ABNORMAL LOW (ref 3.87–5.11)
RDW: 17.3 % — ABNORMAL HIGH (ref 11.5–15.5)
RDW: 17.7 % — ABNORMAL HIGH (ref 11.5–15.5)
RDW: 17.9 % — ABNORMAL HIGH (ref 11.5–15.5)
Smear Review: NORMAL
WBC: 22.8 10*3/uL — ABNORMAL HIGH (ref 4.0–10.5)
WBC: 24 10*3/uL — ABNORMAL HIGH (ref 4.0–10.5)
WBC: 24.3 10*3/uL — ABNORMAL HIGH (ref 4.0–10.5)
nRBC: 0.4 % — ABNORMAL HIGH (ref 0.0–0.2)
nRBC: 0.5 % — ABNORMAL HIGH (ref 0.0–0.2)
nRBC: 0.5 % — ABNORMAL HIGH (ref 0.0–0.2)

## 2024-06-26 LAB — COMPREHENSIVE METABOLIC PANEL WITH GFR
ALT: 98 U/L — ABNORMAL HIGH (ref 0–44)
ALT: 90 U/L — ABNORMAL HIGH (ref 0–44)
ALT: 83 U/L — ABNORMAL HIGH (ref 0–44)
AST: 44 U/L — ABNORMAL HIGH (ref 15–41)
AST: 43 U/L — ABNORMAL HIGH (ref 15–41)
AST: 40 U/L (ref 15–41)
Albumin: <1.5 g/dL — ABNORMAL LOW (ref 3.5–5.0)
Albumin: <1.5 g/dL — ABNORMAL LOW (ref 3.5–5.0)
Albumin: <1.5 g/dL — ABNORMAL LOW (ref 3.5–5.0)
Alkaline Phosphatase: 158 U/L — ABNORMAL HIGH (ref 38–126)
Alkaline Phosphatase: 175 U/L — ABNORMAL HIGH (ref 38–126)
Alkaline Phosphatase: 164 U/L — ABNORMAL HIGH (ref 38–126)
Anion gap: 15 (ref 5–15)
Anion gap: 21 — ABNORMAL HIGH (ref 5–15)
Anion gap: 18 — ABNORMAL HIGH (ref 5–15)
BUN: 84 mg/dL — ABNORMAL HIGH (ref 6–20)
BUN: 83 mg/dL — ABNORMAL HIGH (ref 6–20)
BUN: 82 mg/dL — ABNORMAL HIGH (ref 6–20)
CO2: 20 mmol/L — ABNORMAL LOW (ref 22–32)
CO2: 17 mmol/L — ABNORMAL LOW (ref 22–32)
CO2: 18 mmol/L — ABNORMAL LOW (ref 22–32)
Calcium: 7.6 mg/dL — ABNORMAL LOW (ref 8.9–10.3)
Calcium: 7.6 mg/dL — ABNORMAL LOW (ref 8.9–10.3)
Calcium: 7.7 mg/dL — ABNORMAL LOW (ref 8.9–10.3)
Chloride: 106 mmol/L (ref 98–111)
Chloride: 105 mmol/L (ref 98–111)
Chloride: 108 mmol/L (ref 98–111)
Creatinine, Ser: 4.43 mg/dL — ABNORMAL HIGH (ref 0.44–1.00)
Creatinine, Ser: 4.17 mg/dL — ABNORMAL HIGH (ref 0.44–1.00)
Creatinine, Ser: 4.17 mg/dL — ABNORMAL HIGH (ref 0.44–1.00)
GFR, Estimated: 11 mL/min — ABNORMAL LOW (ref >60)
GFR, Estimated: 12 mL/min — ABNORMAL LOW (ref >60)
GFR, Estimated: 12 mL/min — ABNORMAL LOW (ref >60)
Glucose, Bld: 87 mg/dL (ref 70–99)
Glucose, Bld: 69 mg/dL — ABNORMAL LOW (ref 70–99)
Glucose, Bld: 82 mg/dL (ref 70–99)
Potassium: 3.5 mmol/L (ref 3.5–5.1)
Potassium: 3.5 mmol/L (ref 3.5–5.1)
Potassium: 3.3 mmol/L — ABNORMAL LOW (ref 3.5–5.1)
Sodium: 141 mmol/L (ref 135–145)
Sodium: 143 mmol/L (ref 135–145)
Sodium: 144 mmol/L (ref 135–145)
Total Bilirubin: 0.6 mg/dL (ref 0.0–1.2)
Total Bilirubin: 0.5 mg/dL (ref 0.0–1.2)
Total Bilirubin: 0.6 mg/dL (ref 0.0–1.2)
Total Protein: 4.7 g/dL — ABNORMAL LOW (ref 6.5–8.1)
Total Protein: 4.6 g/dL — ABNORMAL LOW (ref 6.5–8.1)
Total Protein: 4.6 g/dL — ABNORMAL LOW (ref 6.5–8.1)

## 2024-06-26 LAB — POCT I-STAT 7, (LYTES, BLD GAS, ICA,H+H)
Acid-base deficit: 6 mmol/L — ABNORMAL HIGH (ref 0.0–2.0)
Acid-base deficit: 6 mmol/L — ABNORMAL HIGH (ref 0.0–2.0)
Acid-base deficit: 7 mmol/L — ABNORMAL HIGH (ref 0.0–2.0)
Acid-base deficit: 7 mmol/L — ABNORMAL HIGH (ref 0.0–2.0)
Bicarbonate: 18.8 mmol/L — ABNORMAL LOW (ref 20.0–28.0)
Bicarbonate: 19.4 mmol/L — ABNORMAL LOW (ref 20.0–28.0)
Bicarbonate: 20 mmol/L (ref 20.0–28.0)
Bicarbonate: 20 mmol/L (ref 20.0–28.0)
Calcium, Ion: 1.06 mmol/L — ABNORMAL LOW (ref 1.15–1.40)
Calcium, Ion: 1.09 mmol/L — ABNORMAL LOW (ref 1.15–1.40)
Calcium, Ion: 1.1 mmol/L — ABNORMAL LOW (ref 1.15–1.40)
Calcium, Ion: 1.1 mmol/L — ABNORMAL LOW (ref 1.15–1.40)
HCT: 22 % — ABNORMAL LOW (ref 36.0–46.0)
HCT: 25 % — ABNORMAL LOW (ref 36.0–46.0)
HCT: 25 % — ABNORMAL LOW (ref 36.0–46.0)
HCT: 38 % (ref 36.0–46.0)
Hemoglobin: 12.9 g/dL (ref 12.0–15.0)
Hemoglobin: 7.5 g/dL — ABNORMAL LOW (ref 12.0–15.0)
Hemoglobin: 8.5 g/dL — ABNORMAL LOW (ref 12.0–15.0)
Hemoglobin: 8.5 g/dL — ABNORMAL LOW (ref 12.0–15.0)
O2 Saturation: 92 %
O2 Saturation: 93 %
O2 Saturation: 94 %
O2 Saturation: 96 %
Patient temperature: 36.6
Patient temperature: 36.8
Patient temperature: 37.1
Patient temperature: 37.3
Potassium: 3.3 mmol/L — ABNORMAL LOW (ref 3.5–5.1)
Potassium: 3.3 mmol/L — ABNORMAL LOW (ref 3.5–5.1)
Potassium: 3.3 mmol/L — ABNORMAL LOW (ref 3.5–5.1)
Potassium: 3.6 mmol/L (ref 3.5–5.1)
Sodium: 140 mmol/L (ref 135–145)
Sodium: 141 mmol/L (ref 135–145)
Sodium: 141 mmol/L (ref 135–145)
Sodium: 144 mmol/L (ref 135–145)
TCO2: 20 mmol/L — ABNORMAL LOW (ref 22–32)
TCO2: 21 mmol/L — ABNORMAL LOW (ref 22–32)
TCO2: 21 mmol/L — ABNORMAL LOW (ref 22–32)
TCO2: 21 mmol/L — ABNORMAL LOW (ref 22–32)
pCO2 arterial: 39.2 mmHg (ref 32–48)
pCO2 arterial: 39.9 mmHg (ref 32–48)
pCO2 arterial: 43.5 mmHg (ref 32–48)
pCO2 arterial: 43.7 mmHg (ref 32–48)
pH, Arterial: 7.27 — ABNORMAL LOW (ref 7.35–7.45)
pH, Arterial: 7.271 — ABNORMAL LOW (ref 7.35–7.45)
pH, Arterial: 7.289 — ABNORMAL LOW (ref 7.35–7.45)
pH, Arterial: 7.294 — ABNORMAL LOW (ref 7.35–7.45)
pO2, Arterial: 73 mmHg — ABNORMAL LOW (ref 83–108)
pO2, Arterial: 75 mmHg — ABNORMAL LOW (ref 83–108)
pO2, Arterial: 79 mmHg — ABNORMAL LOW (ref 83–108)
pO2, Arterial: 89 mmHg (ref 83–108)

## 2024-06-26 LAB — POCT I-STAT, CHEM 8
BUN: 49 mg/dL — ABNORMAL HIGH (ref 6–20)
Calcium, Ion: 0.97 mmol/L — ABNORMAL LOW (ref 1.15–1.40)
Chloride: 99 mmol/L (ref 98–111)
Creatinine, Ser: 1.6 mg/dL — ABNORMAL HIGH (ref 0.44–1.00)
Glucose, Bld: 255 mg/dL — ABNORMAL HIGH (ref 70–99)
HCT: 19 % — ABNORMAL LOW (ref 36.0–46.0)
Hemoglobin: 6.5 g/dL — CL (ref 12.0–15.0)
Potassium: 3.1 mmol/L — ABNORMAL LOW (ref 3.5–5.1)
Sodium: 134 mmol/L — ABNORMAL LOW (ref 135–145)
TCO2: 13 mmol/L — ABNORMAL LOW (ref 22–32)

## 2024-06-26 LAB — APTT
aPTT: 39 s — ABNORMAL HIGH (ref 24–36)
aPTT: 41 s — ABNORMAL HIGH (ref 24–36)
aPTT: 51 s — ABNORMAL HIGH (ref 24–36)

## 2024-06-26 LAB — LIPASE, BLOOD
Lipase: 212 U/L — ABNORMAL HIGH (ref 11–51)
Lipase: 194 U/L — ABNORMAL HIGH (ref 11–51)
Lipase: 201 U/L — ABNORMAL HIGH (ref 11–51)

## 2024-06-26 LAB — URINALYSIS, W/ REFLEX TO CULTURE (INFECTION SUSPECTED)
Bilirubin Urine: NEGATIVE
Glucose, UA: NEGATIVE mg/dL
Ketones, ur: NEGATIVE mg/dL
Nitrite: NEGATIVE
Protein, ur: 30 mg/dL — AB
Specific Gravity, Urine: 1.015 (ref 1.005–1.030)
WBC, UA: >50 WBC/hpf (ref 0–5)
pH: 5 (ref 5.0–8.0)

## 2024-06-26 LAB — AMYLASE
Amylase: 106 U/L — ABNORMAL HIGH (ref 28–100)
Amylase: 106 U/L — ABNORMAL HIGH (ref 28–100)
Amylase: 103 U/L — ABNORMAL HIGH (ref 28–100)

## 2024-06-26 LAB — MAGNESIUM
Magnesium: 2.3 mg/dL (ref 1.7–2.4)
Magnesium: 2.4 mg/dL (ref 1.7–2.4)
Magnesium: 2.2 mg/dL (ref 1.7–2.4)

## 2024-06-26 LAB — BILIRUBIN, DIRECT
Bilirubin, Direct: 0.1 mg/dL (ref 0.0–0.2)
Bilirubin, Direct: <0.1 mg/dL (ref 0.0–0.2)
Bilirubin, Direct: <0.1 mg/dL (ref 0.0–0.2)

## 2024-06-26 LAB — PHOSPHORUS
Phosphorus: 8.4 mg/dL — ABNORMAL HIGH (ref 2.5–4.6)
Phosphorus: 8.6 mg/dL — ABNORMAL HIGH (ref 2.5–4.6)
Phosphorus: 8.4 mg/dL — ABNORMAL HIGH (ref 2.5–4.6)

## 2024-06-26 LAB — PROTIME-INR
INR: 1.3 — ABNORMAL HIGH (ref 0.8–1.2)
INR: 1.3 — ABNORMAL HIGH (ref 0.8–1.2)
INR: 1.3 — ABNORMAL HIGH (ref 0.8–1.2)
Prothrombin Time: 16.9 s — ABNORMAL HIGH (ref 11.4–15.2)
Prothrombin Time: 17.1 s — ABNORMAL HIGH (ref 11.4–15.2)
Prothrombin Time: 17.2 s — ABNORMAL HIGH (ref 11.4–15.2)

## 2024-06-26 LAB — CK TOTAL AND CKMB (NOT AT ARMC)
CK, MB: 7.7 ng/mL — ABNORMAL HIGH (ref 0.5–5.0)
CK, MB: 12 ng/mL — ABNORMAL HIGH (ref 0.5–5.0)
CK, MB: 6.1 ng/mL — ABNORMAL HIGH (ref 0.5–5.0)
Total CK: 53 U/L (ref 38–234)
Total CK: 53 U/L (ref 38–234)
Total CK: 50 U/L (ref 38–234)

## 2024-06-26 LAB — FIBRINOGEN
Fibrinogen: >800 mg/dL — ABNORMAL HIGH (ref 210–475)
Fibrinogen: >800 mg/dL — ABNORMAL HIGH (ref 210–475)
Fibrinogen: >800 mg/dL — ABNORMAL HIGH (ref 210–475)

## 2024-06-26 LAB — TROPONIN I (HIGH SENSITIVITY)
Troponin I (High Sensitivity): 287 ng/L (ref <18)
Troponin I (High Sensitivity): 204 ng/L (ref <18)

## 2024-06-26 LAB — PREPARE RBC (CROSSMATCH)

## 2024-06-26 SURGERY — SURGICAL PROCUREMENT, ORGAN
Anesthesia: Choice

## 2024-06-26 MED ORDER — POTASSIUM CHLORIDE 20 MEQ PO PACK
40.0000 meq | PACK | Freq: Once | ORAL | Status: AC
  Administered 2024-06-26: 40 meq
  Filled 2024-06-26: qty 2

## 2024-06-26 MED ORDER — PIPERACILLIN-TAZOBACTAM 3.375 G IVPB 30 MIN
3.3750 g | INTRAVENOUS | Status: AC
Start: 2024-06-26 — End: 2024-06-26
  Administered 2024-06-26: 3.375 g via INTRAVENOUS
  Filled 2024-06-26: qty 50

## 2024-06-26 MED ORDER — DEXTROSE 50 % IV SOLN
INTRAVENOUS | Status: AC
  Filled 2024-06-26: qty 50

## 2024-06-26 MED ORDER — FENTANYL CITRATE PF 50 MCG/ML IJ SOSY
50.0000 ug | PREFILLED_SYRINGE | INTRAMUSCULAR | Status: DC | PRN
  Filled 2024-06-26: qty 2

## 2024-06-26 MED ORDER — DEXTROSE 50 % IV SOLN
12.5000 g | INTRAVENOUS | Status: AC
  Administered 2024-06-26: 12.5 g via INTRAVENOUS

## 2024-06-26 MED ORDER — HEPARIN SODIUM 10000 UNIT/ML FOR ORGAN DONATION
30000.0000 [IU] | INTRAMUSCULAR | Status: AC
  Administered 2024-06-26: 30000 [IU] via INTRAVENOUS
  Filled 2024-06-26: qty 3

## 2024-06-26 MED ORDER — PIPERACILLIN-TAZOBACTAM 3.375 G IVPB: 3.3750 g | Freq: Two times a day (BID) | INTRAVENOUS | Status: DC

## 2024-06-26 MED ORDER — 0.9 % SODIUM CHLORIDE (POUR BTL) OPTIME
TOPICAL | Status: DC | PRN
  Administered 2024-06-26: 6000 mL

## 2024-06-26 MED ORDER — SODIUM CHLORIDE 0.9% IV SOLUTION: Freq: Once | INTRAVENOUS | Status: DC

## 2024-06-26 MED ORDER — HEPARIN SODIUM 10000 UNIT/ML FOR ORGAN DONATION
10500.0000 [IU] | INTRAMUSCULAR | Status: DC
  Filled 2024-06-26: qty 2

## 2024-06-26 MED ORDER — MIDAZOLAM HCL 2 MG/2ML IJ SOLN
2.0000 mg | INTRAMUSCULAR | Status: DC | PRN
  Filled 2024-06-26: qty 4

## 2024-06-26 MED ORDER — HEPARIN SODIUM 10000 UNIT/ML FOR ORGAN DONATION
30000.0000 [IU] | INTRAMUSCULAR | Status: DC
  Filled 2024-06-26: qty 3

## 2024-06-26 SURGICAL SUPPLY — 78 items
BAG COUNTER SPONGE SURGICOUNT (BAG) ×1 IMPLANT
BLADE CLIPPER SURG (BLADE) IMPLANT
BLADE SAW STERNAL (BLADE) ×1 IMPLANT
BLADE STERNUM SYSTEM 6 (BLADE) IMPLANT
BLADE SURG 10 STRL SS (BLADE) IMPLANT
CLIP APPLIE 11 MED OPEN (CLIP) ×1 IMPLANT
CLIP TI MEDIUM 24 (CLIP) IMPLANT
CLIP TI WIDE RED SMALL 24 (CLIP) IMPLANT
CNTNR URN SCR LID CUP LEK RST (MISCELLANEOUS) ×1 IMPLANT
COVER BACK TABLE 60X90IN (DRAPES) IMPLANT
COVER MAYO STAND STRL (DRAPES) IMPLANT
COVER SURGICAL LIGHT HANDLE (MISCELLANEOUS) ×1 IMPLANT
DRAPE HALF SHEET 40X57 (DRAPES) IMPLANT
DRAPE SLUSH MACHINE 52X66 (DRAPES) ×1 IMPLANT
DRSG COVADERM 4X10 (GAUZE/BANDAGES/DRESSINGS) IMPLANT
DRSG TELFA 3X8 NADH STRL (GAUZE/BANDAGES/DRESSINGS) ×1 IMPLANT
DURAPREP 26ML APPLICATOR (WOUND CARE) IMPLANT
ELECT BLADE 6.5 EXT (BLADE) IMPLANT
ELECTRODE REM PT RTRN 9FT ADLT (ELECTROSURGICAL) ×2 IMPLANT
GAUZE 4X4 16PLY ~~LOC~~+RFID DBL (SPONGE) IMPLANT
GAUZE SPONGE 4X4 16PLY XRAY LF (GAUZE/BANDAGES/DRESSINGS) IMPLANT
GLOVE BIO SURGEON STRL SZ7 (GLOVE) IMPLANT
GLOVE BIO SURGEON STRL SZ7.5 (GLOVE) IMPLANT
GLOVE BIO SURGEON STRL SZ8 (GLOVE) IMPLANT
GLOVE BIO SURGEON STRL SZ8.5 (GLOVE) IMPLANT
GLOVE BIOGEL PI IND STRL 7.0 (GLOVE) IMPLANT
GLOVE BIOGEL PI IND STRL 7.5 (GLOVE) IMPLANT
GLOVE BIOGEL PI IND STRL 8 (GLOVE) IMPLANT
GLOVE BIOGEL PI IND STRL 8.5 (GLOVE) IMPLANT
GLOVE SURG SS PI 7.0 STRL IVOR (GLOVE) IMPLANT
GLOVE SURG SS PI 7.5 STRL IVOR (GLOVE) IMPLANT
GLOVE SURG SS PI 8.0 STRL IVOR (GLOVE) IMPLANT
GOWN STRL REUS W/ TWL LRG LVL3 (GOWN DISPOSABLE) ×4 IMPLANT
GOWN STRL REUS W/ TWL XL LVL3 (GOWN DISPOSABLE) ×2 IMPLANT
HANDLE SUCTION POOLE (INSTRUMENTS) IMPLANT
KIT POST MORTEM ADULT 36X90 (BAG) ×1 IMPLANT
KIT TURNOVER KIT B (KITS) ×1 IMPLANT
LOOP VASCLR MAXI BLUE 18IN ST (MISCELLANEOUS) IMPLANT
LOOPS VASCLR MAXI BLUE 18IN ST (MISCELLANEOUS) ×3 IMPLANT
MANIFOLD NEPTUNE II (INSTRUMENTS) ×1 IMPLANT
NDL BIOPSY 14X6 SOFT TISS (NEEDLE) IMPLANT
NEEDLE BIOPSY 14X6 SOFT TISS (NEEDLE) ×1 IMPLANT
NS IRRIG 1000ML POUR BTL (IV SOLUTION) IMPLANT
PACK AORTA (CUSTOM PROCEDURE TRAY) ×1 IMPLANT
PAD ARMBOARD POSITIONER FOAM (MISCELLANEOUS) ×2 IMPLANT
PENCIL BUTTON HOLSTER BLD 10FT (ELECTRODE) ×1 IMPLANT
SOL PREP POV-IOD 4OZ 10% (MISCELLANEOUS) ×2 IMPLANT
SPONGE INTESTINAL PEANUT (DISPOSABLE) IMPLANT
SPONGE T-LAP 18X18 ~~LOC~~+RFID (SPONGE) IMPLANT
STAPLER SKIN PROX 35W (STAPLE) ×1 IMPLANT
SUT BONE WAX W31G (SUTURE) IMPLANT
SUT ETHIBOND 5 LR DA (SUTURE) IMPLANT
SUT ETHILON 1 LR 30 (SUTURE) ×2 IMPLANT
SUT ETHILON 2 LR (SUTURE) IMPLANT
SUT PROLENE 3 0 SH 1 (SUTURE) IMPLANT
SUT PROLENE 4 0 SH DA (SUTURE) IMPLANT
SUT PROLENE 4-0 RB1 .5 CRCL 36 (SUTURE) IMPLANT
SUT PROLENE 5 0 C 1 24 (SUTURE) IMPLANT
SUT PROLENE 6 0 BV (SUTURE) IMPLANT
SUT SILK 0 TIES 10X30 (SUTURE) IMPLANT
SUT SILK 1 SH (SUTURE) IMPLANT
SUT SILK 1 TIES 10X30 (SUTURE) IMPLANT
SUT SILK 2 0 SH (SUTURE) IMPLANT
SUT SILK 2 0 SH CR/8 (SUTURE) IMPLANT
SUT SILK 2 0 TIES 10X30 (SUTURE) IMPLANT
SUT SILK 2-0 18XBRD TIE 12 (SUTURE) IMPLANT
SUT SILK 3 0 SH CR/8 (SUTURE) IMPLANT
SUT SILK 3 0 TIES 10X30 (SUTURE) IMPLANT
SUT SILK 3-0 18XBRD TIE 12 (SUTURE) IMPLANT
SWAB COLLECTION DEVICE MRSA (MISCELLANEOUS) IMPLANT
SWAB CULTURE ESWAB REG 1ML (MISCELLANEOUS) IMPLANT
SYR 50ML LL SCALE MARK (SYRINGE) IMPLANT
SYRINGE TOOMEY DISP (SYRINGE) IMPLANT
TAPE UMBILICAL 1/8 X36 TWILL (MISCELLANEOUS) IMPLANT
TUBE CONNECTING 12X1/4 (SUCTIONS) ×1 IMPLANT
VASCULAR TIE MINI RED 18IN STL (MISCELLANEOUS) IMPLANT
WATER STERILE IRR 1000ML POUR (IV SOLUTION) IMPLANT
YANKAUER SUCT BULB TIP NO VENT (SUCTIONS) ×1 IMPLANT

## 2024-06-26 NOTE — Progress Notes (Signed)
 eLink Physician-Brief Progress Note Patient Name: Rita [name] DOB: 1966-06-13 MRN: [id number]   Date of Service  06/29/2024  HPI/Events of Note  Potassium 3.5  eICU Interventions  KCl     Intervention Category Minor Interventions: Electrolytes abnormality - evaluation and management  Salahuddin Arismendez 07/06/2024, 4:46 AM

## 2024-06-26 NOTE — Progress Notes (Signed)
 NAME:  Rita [name], MRN:  [id number], DOB:  15-May-1966, LOS: 5 ADMISSION DATE:  06/21/2024, CONSULTATION DATE:  06/21/2024 REFERRING MD:  Pearlean Manus, MD, CHIEF COMPLAINT:  Sepsis  History of Present Illness:  67 yu/o female with PMH for PAD, Cociane and THC abuse, HLD, HTN who recently suffered a left MCA stroke who presented with found unresponsive with diarrhea, CT abd showing paralytic ileus and enterocolitis with with heme positive stools significant abdominal discomfort with abdominal distention and persistent nausea and dry heaving... -NG tube placement attempted unsuccessfully. She had episode of bradycardia and rates in the 20's.  Patient had a weak pulse and received Atropine x 2, but then lost her pulse and possibly had V fib/Vtach and then had PEA.  and CPR initiated and given Epinephrine.  ROCS achieved but she was intubated by Anesthesia.   She was hemoccult positive.  She was ordered for 2 units PRBC. Post code cxr showing a right hydropneumothorax and rib fracture possibly during CPR.   Pertinent  Medical History  Arthritis, Barrett's esophagus with esophagitis (03/26/2013), Chronic abdominal pain, Chronic lower back pain, COPD (chronic obstructive pulmonary disease) (HCC), Depression, Dyspnea, GERD (gastroesophageal reflux disease), H/O acute pancreatitis, Heart murmur, Hiatal hernia, High cholesterol, Hypertension, Peptic ulcer, Pneumonia (2000s X 1), Stroke (HCC), and Tobacco use (03/26/2013).    Significant Hospital Events: Including procedures, antibiotic start and stop dates in addition to other pertinent events   6/27: Admit to ICU 6/28 continue to have myoclonus, on high-dose propofol  6/29 triglyceride went up, propofol  was decreased, placed on Versed  6/30 MRI brain>> 1. Expected evolution of extensive Left MCA territory infarct. Laminar necrosis, developing encephalomalacia, mild petechial hemorrhage. No intracranial mass effect.  2. No new intracranial  abnormality. Advanced chronic ischemic disease also in the left PCA and bilateral cerebellar artery territories.  Interim History / Subjective:  No overnight issues Vasopressor requirement is improving Getting worked up for organ procurement  Objective    Blood pressure (!) 107/58, pulse 70, temperature 98.1 F (36.7 C), resp. rate (!) 25, height 5' 7 (1.702 m), weight 104.1 kg, last menstrual period 02/09/2012, SpO2 94%.    Vent Mode: PRVC FiO2 (%):  [40 %-50 %] 50 % Set Rate:  [25 bmp] 25 bmp Vt Set:  [490 mL] 490 mL PEEP:  [10 cmH20] 10 cmH20 Plateau Pressure:  [22 cmH20-24 cmH20] 24 cmH20   Intake/Output Summary (Last 24 hours) at 06/30/2024 0933 Last data filed at 07/08/2024 0830 Gross per 24 hour  Intake 3219.66 ml  Output 1710 ml  Net 1509.66 ml   Filed Weights   06/24/24 0429 06/25/24 0500 07/06/2024 0330  Weight: 104.9 kg 102.1 kg 104.1 kg    Examination: General: Crtitically ill-appearing female, orally intubated HEENT: [place]/AT, eyes anicteric.  ETT and cortrak in place Neuro: Eyes closed, does not open, not following commands, sedated with Versed  Chest: Coarse breath sounds, no wheezes or rhonchi Heart: Regular rate and rhythm, no murmurs or gallops Abdomen: Soft, nondistended, bowel sounds present   Labs and images reviewed  Patient Lines/Drains/Airways Status     Active Line/Drains/Airways     Name Placement date Placement time Site Days   Peripheral IV 06/21/24 22 G Anterior;Right Forearm 06/21/24  0806  Forearm  3   CVC Triple Lumen 06/21/24 Right Femoral 06/21/24  0718  -- 3   Chest Tube 1 Lateral;Right Pleural 06/21/24  2209  Pleural  3   NG/OG Vented/Dual Lumen 14 Fr. Left nare Marking at nare/corner of mouth  60 cm 06/21/24  2236  Left nare  3   Urethral Catheter Almarie, NT Latex;Temperature probe 14 Fr. 06/21/24  9373  Latex;Temperature probe  3   Fecal Management System 45 mL 06/21/24  1847  -- 3   Airway 7.5 mm 06/21/24  1530  -- 3         Resolved problem list  Hyponatremia/hypocalcemia, resolved Hyperphosphatemia  Assessment and Plan  Status post PEA cardiac arrest Status myoclonus Acute encephalopathy, likely hypoxic/anoxic Acute respiratory failure with hypoxia and hypercapnia Severe sepsis with septic shock due to bilateral lower lobe pneumonia, POA MRSE bacteremia Right-sided pneumothorax Bilateral pleural effusion status post right-sided chest tube placement Acute metabolic acidosis/lactic acidosis Acute kidney injury due to ischemic ATN in the setting of cardiac arrest, worsening Shock liver Hypokalemia Acute GI bleeding Acute blood loss anemia Recent history of stroke with right-sided weakness Propofol  induced hypertriglyceridemia, improving  Continue supportive care Patient is being worked up for Theatre manager, plan to go to the OR later today [facility] is following Continue broad-spectrum antibiotics Serum creatinine is improving  Best Practice (right click and Reselect all SmartList Selections daily)   Diet/type: NPO  DVT prophylaxis prophylactic heparin   Pressure ulcer(s): Please refer to nursing notes GI prophylaxis: PPI Lines: Right Femoral Central Line Foley: Still needed Code Status: DNR Goals of care discussion: 6/28  Labs   CBC: Recent Labs  Lab 06/25/24 0521 06/25/24 0558 06/25/24 1151 06/25/24 1159 06/25/24 1751 06/25/24 1756 06/25/24 2349 07/04/2024 0022 07/02/2024 0530 07/04/2024 0542  WBC 31.1*  --  26.7*  --  28.9*  --  24.3*  --  24.0*  --   NEUTROABS 26.0*  --  23.1*  --  24.3*  --  19.6*  --  19.4*  --   HGB 7.9*   < > 7.5*   < > 8.4* 8.5* 8.0* 8.5* 7.8* 12.9  HCT 23.1*   < > 22.3*   < > 25.1* 25.0* 24.0* 25.0* 23.6* 38.0  MCV 89.9  --  90.3  --  89.0  --  88.9  --  88.4  --   PLT 335  --  294  --  291  --  278  --  266  --    < > = values in this interval not displayed.    Basic Metabolic Panel: Recent Labs  Lab 06/25/24 0521 06/25/24 0558  06/25/24 1151 06/25/24 1159 06/25/24 1751 06/25/24 1756 06/25/24 2349 06/29/2024 0022 06/30/2024 0530 07/09/2024 0542  NA 140  139   < > 139   < > 138 140 141 141 143 141  K 3.4*  3.4*   < > 4.1   < > 3.7 3.6 3.5 3.3* 3.5 3.3*  CL 103  104  --  103  --  102  --  106  --  105  --   CO2 19*  20*  --  18*  --  19*  --  20*  --  17*  --   GLUCOSE 122*  120*  --  115*  --  118*  --  87  --  69*  --   BUN 89*  86*  --  87*  --  84*  --  84*  --  83*  --   CREATININE 4.38*  4.40*  --  4.29*  --  4.24*  --  4.43*  --  4.17*  --   CALCIUM  7.2*  7.1*  --  8.0*  --  7.8*  --  7.6*  --  7.6*  --   MG 2.2  --  2.3  --  2.2  --  2.3  --  2.4  --   PHOS 10.1*  10.4*  --  10.1*  --  9.2*  --  8.4*  --  8.6*  --    < > = values in this interval not displayed.   GFR: Estimated Creatinine Clearance: 18.2 mL/min (A) (by C-G formula based on SCr of 4.17 mg/dL (H)). Recent Labs  Lab 06/21/24 0845 06/21/24 1539 06/21/24 2138 06/21/24 2140 06/22/24 0431 06/24/24 1555 06/24/24 1711 06/25/24 1151 06/25/24 1751 06/25/24 2349 07/03/2024 0530  WBC  --  27.6*   < >  --    < >  --    < > 26.7* 28.9* 24.3* 24.0*  LATICACIDVEN >9.0* >9.0*  --  1.8  --  1.7  --   --   --   --   --    < > = values in this interval not displayed.    Liver Function Tests: Recent Labs  Lab 06/25/24 0521 06/25/24 1151 06/25/24 1751 06/25/24 2349 07/13/2024 0530  AST 60* 52* 48* 44* 43*  ALT 132* 117* 108* 98* 90*  ALKPHOS 134* 137* 145* 158* 175*  BILITOT 0.5 0.9 0.6 0.6 0.5  PROT 4.7* 4.6* 4.7* 4.7* 4.6*  ALBUMIN <1.5*  <1.5* <1.5* <1.5* <1.5* <1.5*   Recent Labs  Lab 06/25/24 0521 06/25/24 1151 06/25/24 1751 06/25/24 2349 07/09/2024 0530  LIPASE 211* 202* 209* 212* 194*  AMYLASE 120* 116* 107* 106* 106*   Recent Labs  Lab 06/21/24 0722  AMMONIA <13    ABG    Component Value Date/Time   PHART 7.289 (L) 07/08/2024 0542   PCO2ART 39.2 07/18/2024 0542   PO2ART 89 07/08/2024 0542   HCO3 18.8 (L)  07/08/2024 0542   TCO2 20 (L) 07/21/2024 0542   ACIDBASEDEF 7.0 (H) 07/22/2024 0542   O2SAT 96 07/02/2024 0542     Coagulation Profile: Recent Labs  Lab 06/25/24 0521 06/25/24 1151 06/25/24 1751 06/25/24 2349 07/17/2024 0530  INR 1.3* 1.3* 1.3* 1.3* 1.3*    Cardiac Enzymes: Recent Labs  Lab 06/25/24 0521 06/25/24 1151 06/25/24 1751 06/25/24 2349 07/10/2024 0530  CKTOTAL 115 89 61 53 53  CKMB 10.4* 10.9* 6.9* 7.7* 12.0*    HbA1C: Hgb A1c MFr Bld  Date/Time Value Ref Range Status  06/24/2024 05:34 PM 5.5 4.8 - 5.6 % Final    Comment:    (NOTE) Diagnosis of Diabetes The following HbA1c ranges recommended by the American Diabetes Association (ADA) may be used as an aid in the diagnosis of diabetes mellitus.  Hemoglobin             Suggested A1C NGSP%              Diagnosis  <5.7                   Non Diabetic  5.7-6.4                Pre-Diabetic  >6.4                   Diabetic  <7.0                   Glycemic control for                       adults with diabetes.    05/27/2024  08:11 PM 6.3 (H) 4.8 - 5.6 % Final    Comment:    (NOTE) Diagnosis of Diabetes The following HbA1c ranges recommended by the American Diabetes Association (ADA) may be used as an aid in the diagnosis of diabetes mellitus.  Hemoglobin             Suggested A1C NGSP%              Diagnosis  <5.7                   Non Diabetic  5.7-6.4                Pre-Diabetic  >6.4                   Diabetic  <7.0                   Glycemic control for                       adults with diabetes.      CBG: Recent Labs  Lab 06/25/24 2021 06/25/24 2358 07/08/2024 0358 07/05/2024 0815 07/14/2024 0843  GLUCAP 85 82 72 69* 131*    The patient is critically ill due to septic shock, requiring titration of vasopressors/cardiac arrest.  Critical care was necessary to treat or prevent imminent or life-threatening deterioration.  Critical care was time spent personally by me on the following  activities: development of treatment plan with patient and/or surrogate as well as nursing, discussions with consultants, evaluation of patient's response to treatment, examination of patient, obtaining history from patient or surrogate, ordering and performing treatments and interventions, ordering and review of laboratory studies, ordering and review of radiographic studies, pulse oximetry, re-evaluation of patient's condition and participation in multidisciplinary rounds.   During this encounter critical care time was devoted to patient care services described in this note for 30 minutes.     Valinda Novas, MD [facility] See Amion for pager If no response to pager, please call [phone] until 7pm After 7pm, Please call E-link [phone]

## 2024-06-26 NOTE — Progress Notes (Signed)
 Patient escorted to OR for [facility] donation. Patient extubated at 1847 and pressors stopped. Patient pronounced dead at 3 by Marjorie Gunner, RN and Rock Aw, RN. Ketamine  and Versed  bags brought back to unit to be wasted. Versed  bag wasn't in pyxis system; 62 ml was wasted with both RNs.

## 2024-06-26 NOTE — Progress Notes (Signed)
 RT assisted with patient transport on vent to OR and extubated the patient per [facility] request at [phone].

## 2024-06-26 NOTE — Progress Notes (Addendum)
 Patient extubated to room air per [facility] request in OR.

## 2024-06-27 ENCOUNTER — Encounter (HOSPITAL_COMMUNITY): Payer: Self-pay

## 2024-06-27 LAB — URINE CULTURE: Culture: NO GROWTH

## 2024-06-28 LAB — TYPE AND SCREEN
ABO/RH(D): O POS
Antibody Screen: NEGATIVE
Unit division: 0
Unit division: 0
Unit division: 0
Unit division: 0
Unit division: 0
Unit division: 0

## 2024-06-28 LAB — BLOOD CULTURE ID PANEL (REFLEXED) - BCID2

## 2024-06-28 LAB — BPAM RBC
Blood Product Expiration Date: 202508052359
Blood Product Expiration Date: 202508062359
Blood Product Expiration Date: 202508062359
Blood Product Expiration Date: 202508062359
Blood Product Expiration Date: 202508062359
Blood Product Expiration Date: 202508062359
ISSUE DATE / TIME: 202507011235
ISSUE DATE / TIME: 202507021539
ISSUE DATE / TIME: 202507021539
ISSUE DATE / TIME: 202507021539
ISSUE DATE / TIME: 202507021539
ISSUE DATE / TIME: 202507021539
Unit Type and Rh: 5100
Unit Type and Rh: 5100
Unit Type and Rh: 5100
Unit Type and Rh: 5100
Unit Type and Rh: 5100
Unit Type and Rh: 5100

## 2024-06-29 LAB — CULTURE, BLOOD (ROUTINE X 2)
Culture: NO GROWTH
Special Requests: ADEQUATE

## 2024-07-01 LAB — SURGICAL PATHOLOGY

## 2024-07-02 LAB — CULTURE, BLOOD (ROUTINE X 2)

## 2024-07-02 MED FILL — Midazolam HCl Inj PF 5 MG/5ML (Base Equivalent): INTRAMUSCULAR | Qty: 5 | Status: AC

## 2024-07-02 MED FILL — Sodium Bicarbonate IV Soln 8.4%: INTRAVENOUS | Qty: 50 | Status: AC

## 2024-07-09 MED FILL — Medication: Qty: 1 | Status: AC

## 2024-07-12 ENCOUNTER — Inpatient Hospital Stay: Admitting: Diagnostic Neuroimaging

## 2024-07-26 NOTE — Procedures (Signed)
 Patient Name: Rita [name]  MRN: [id number]  Epilepsy Attending: Arlin MALVA Krebs  Referring Physician/Provider: Ogan, Okoronkwo U, MD  Duration: 06/24/2024 0654 to 06/24/2024 1530   Patient history:  58 y.o. female with concern for myoclonic seizure activity after PEA arrest at approximately 5 AM on 6/27. EEG to evaluate for seizure.   Level of alertness:  comatose   AEDs during EEG study: Propofol , Versed , LEV, VPA   Technical aspects: This EEG study was done with scalp electrodes positioned according to the 10-20 International system of electrode placement. Electrical activity was reviewed with band pass filter of 1-70Hz , sensitivity of 7 uV/mm, display speed of 62mm/sec with a 60Hz  notched filter applied as appropriate. EEG data were recorded continuously and digitally stored.  Video monitoring was available and reviewed as appropriate.   Description: EEG initially showed burst suppression with burst of 3-6hz  theta-delta slowing and generalized spikes lasting 3-4 seconds admixed with generalized suppression lasting 4-12 seconds. Gradually EEG worsened and showed near continuous generalized 5 to 6 Hz theta slowing admixed with generalized spikes with fluctuating frequency of 1 to 5 Hz.  Hyperventilation and photic stimulation were not performed.     ABNORMALITY - Myoclonic seizure, generalized - Spikes, generalized - Burst suppression, generalized   IMPRESSION: This study showed evidence of epileptogenicity with generalized onset as well as severe diffuse encephalopathy.  Due to the frequency of spikes, this EEG was on the ictal-interictal continuum with high suspicion for ictal nature.  [name]

## 2024-07-26 NOTE — Death Summary Note (Signed)
 DEATH SUMMARY   Patient Details  Name: Rita [name] MRN: [id number] DOB: [birth year]  Admission/Discharge Information   Admit Date:  2024-06-29  Date of Death: Date of Death: July 04, 2024  Time of Death: Time of Death: July 09, 1902  Length of Stay: 6  Referring Physician: [facility]   Reason(s) for Hospitalization  Status post PEA cardiac arrest Status myoclonus Acute encephalopathy, likely hypoxic/anoxic Acute respiratory failure with hypoxia and hypercapnia Severe sepsis with septic shock due to bilateral lower lobe pneumonia, POA MRSE bacteremia Right-sided pneumothorax Bilateral pleural effusion status post right-sided chest tube placement Acute metabolic acidosis/lactic acidosis Acute kidney injury due to ischemic ATN in the setting of cardiac arrest, worsening Shock liver Hypokalemia/hyponatremia/hypocalcemia/hyperphosphatemia Acute GI bleeding Acute blood loss anemia Recent history of stroke with right-sided weakness Propofol  induced hypertriglyceridemia  Diagnoses  Preliminary cause of death: Withdrawal of care in the setting of multisystem organ failure and severe anoxic brain injury postcardiac arrest Secondary Diagnoses (including complications and co-morbidities):  Principal Problem:   Severe sepsis (HCC) Active Problems:   Esophageal dysphagia   GERD (gastroesophageal reflux disease)   Tobacco use   Barrett's esophagus with esophagitis   Hypokalemia   Anemia   Gastroenteritis   Opioid dependence (HCC)   Lung mass   Internal carotid artery stenosis, left   Middle cerebral artery embolism, left   Upper GI bleed   Acute encephalopathy   Lactic acidosis   Cardiac arrest (HCC)   Acute sepsis [facility])   Brief Hospital Course (including significant findings, care, treatment, and services provided and events leading to death)  Rita [name] is a 58 y.o. year old female with PMH for PAD, Cociane and THC abuse, HLD, HTN who recently suffered a left MCA stroke  who presented with found unresponsive with diarrhea, CT abd showing paralytic ileus and enterocolitis with with heme positive stools significant abdominal discomfort with abdominal distention and persistent nausea and dry heaving... -NG tube placement attempted unsuccessfully. She had episode of bradycardia and rates in the 07-09-2024.  Patient had a weak pulse and received Atropine x 2, but then lost her pulse and possibly had V fib/Vtach and then had PEA.  and CPR initiated and given Epinephrine.  ROCS achieved but she was intubated by Anesthesia.   She was hemoccult positive.  She was ordered for 2 units PRBC. Post code cxr showing a right hydropneumothorax and rib fracture possibly during CPR, chest tube was placed Patient started with myoclonus, was started on high-dose propofol , despite that she continued to have myoclonus, was started on Versed  infusion, neurology was following, her triglyceride level went up, propofol  was finally stopped, MRI brain was done which showed large extensive evolving left MCA stroke with encephalomalacia, no other intracranial abnormalities.  Patient remained in AKI Goals of care discussions were carried with family due to multisystem organ failure, they decided to proceed with comfort care and palliative extubation, [facility] bridge was called as patient was listed donor, patient's family agreed, she went to OR on 07-04-24 and was palliatively extubated and she passed at 7:03 PM.  Pertinent Labs and Studies  Significant Diagnostic Studies DG CHEST PORT 1 VIEW Result Date: 07-04-24 CLINICAL DATA:  Organ donation. EXAM: PORTABLE CHEST 1 VIEW COMPARISON:  06/25/2024 FINDINGS: Rightward patient rotation. Endotracheal tube tip is 4.6 cm above the base of the carina. The NG tube passes into the stomach although the distal tip position is not included on the film. Right chest tube remains in place. No definite pneumothorax. Slight progression  of right base collapse/consolidation with  effusion. Probable layering small left effusion. Telemetry leads overlie the chest. IMPRESSION: 1. Slight progression of right base collapse/consolidation with effusion. 2. Probable layering small left effusion. Electronically Signed   By: Camellia Candle M.D.   On: 07/11/2024 07:28   DG CHEST PORT 1 VIEW Result Date: 06/25/2024 CLINICAL DATA:  Organ donation. EXAM: PORTABLE CHEST 1 VIEW COMPARISON:  r 06/24/2024 FINDINGS: Endotracheal tube tip is 4.6 cm above the base of the carina. The NG tube passes into the stomach although the distal tip position is not included on the film. Right chest tube noted without evidence for pneumothorax. Bibasilar atelectasis or infiltrate with layering pleural effusions, right greater than left. IMPRESSION: 1. Bibasilar atelectasis or infiltrate with layering pleural effusions, right greater than left. 2. Right chest tube without pneumothorax. Electronically Signed   By: Camellia Candle M.D.   On: 06/25/2024 07:50   DG CHEST PORT 1 VIEW Result Date: 06/24/2024 CLINICAL DATA:  Organ donation EXAM: PORTABLE CHEST 1 VIEW COMPARISON:  06/21/2024 FINDINGS: Endotracheal tube tip in the intrathoracic trachea. Subdiaphragmatic enteric tube. Right chest tube. Layering pleural effusion in the right lung. Right basilar atelectasis or infiltrates. The left lung is clear. IMPRESSION: Layering pleural effusion in the right lung. Right basilar atelectasis or infiltrates. Electronically Signed   By: Norman Gatlin M.D.   On: 06/24/2024 20:24   MR BRAIN WO CONTRAST Result Date: 06/24/2024 CLINICAL DATA:  58 year old female code stroke presentation on 05/27/2024 with acute on chronic cortical ischemia at that time. Altered mental status anoxic brain damage. EXAM: MRI HEAD WITHOUT CONTRAST TECHNIQUE: Multiplanar, multiecho pulse sequences of the brain and surrounding structures were obtained without intravenous contrast. COMPARISON:  Brain MRI 05/28/2024. FINDINGS: Brain: Fading abnormal  diffusion and developing encephalomalacia now throughout the left hemisphere MCA territory cortex and subcortical white matter (series 15, image 17) with laminar necrosis. Comparatively mild petechial hemorrhage on SWI in some of the affected areas (series 18 image 46). Mild new ex vacuo enlargement of the left lateral ventricle. Pre-existing chronic left PCA or PCA/MCA watershed infarct with encephalomalacia. Chronic bilateral cerebellar infarcts with stable hemosiderin. No new restricted diffusion to suggest acute infarction. No midline shift, mass effect, evidence of mass lesion, ventriculomegaly, extra-axial collection or acute intracranial hemorrhage. Cervicomedullary junction and pituitary are within normal limits. Vascular: Major intracranial vascular flow voids are stable, preserved. Skull and upper cervical spine: Negative visible cervical spine. Visualized bone marrow signal is within normal limits. Sinuses/Orbits: Negative orbits. Intubated, left nasoenteric tube in place. Fluid and mucosal thickening in the paranasal sinuses and mastoids. Other: Negative visible scalp and face. IMPRESSION: 1. Expected evolution of extensive Left MCA territory infarct. Laminar necrosis, developing encephalomalacia, mild petechial hemorrhage. No intracranial mass effect. 2. No new intracranial abnormality. Advanced chronic ischemic disease also in the left PCA and bilateral cerebellar artery territories. Electronically Signed   By: VEAR Hurst M.D.   On: 06/24/2024 11:07   Overnight EEG with video Result Date: 06/23/2024 Shelton Arlin KIDD, MD     06/23/2024  7:29 AM Patient Name: CHRISTALYNN BOISE MRN: [id number] Epilepsy Attending: Arlin KIDD Shelton Referring Physician/Provider: Ogan, Okoronkwo U, MD Duration: 06/22/2024 0654 to 06/23/2024 0654 Patient history:  58 y.o. female with concern for myoclonic seizure activity after PEA arrest at approximately 5 AM on 6/27. EEG to evaluate for seizure. Level of alertness:  comatose AEDs  during EEG study: Propofol , Versed  Technical aspects: This EEG study was done with scalp electrodes positioned according  to the 10-20 International system of electrode placement. Electrical activity was reviewed with band pass filter of 1-70Hz , sensitivity of 7 uV/mm, display speed of 8mm/sec with a 60Hz  notched filter applied as appropriate. EEG data were recorded continuously and digitally stored.  Video monitoring was available and reviewed as appropriate. Description: EEG initially showed continuous generalized 5 to 6 Hz theta slowing. Generalized spikes were noted at times periodic at 2-3hz . Intermittently, patient was noted to have episodes of brief sudden whole body jerking. Concomitant EEG showed generalized spikes consistent with myoclonic seizures. Around 2130 on 06/22/2024, EEG showed burst suppression with burst of 3-6hz  theta-delta slowing and generalized spikes lasting 3-4 seconds admixed with generalized suppression lasting 4-12 seconds. Hyperventilation and photic stimulation were not performed.   ABNORMALITY - Myoclonic seizure, generalized - Spikes, generalized - Burst suppression, generalized IMPRESSION: At the beginning of the study, patient was noted to have episodes of brief sudden whole body jerking consistent with myoclonic seizure. Gradually as medications were adjusted after around 2130 on 06/22/2024, EEG showed evidence of epileptogenicity with generalized onset as well as profound diffuse encephalopathy. Arlin MALVA Krebs   DG Abd 1 View Result Date: 06/21/2024 CLINICAL DATA:  NG tube placement EXAM: ABDOMEN - 1 VIEW COMPARISON:  Same day CT abdomen pelvis FINDINGS: Enteric tube tip and side-port in the stomach. Remainder unchanged from same day CT abdomen pelvis. IMPRESSION: Enteric tube tip and side-port in the stomach. Electronically Signed   By: Norman Gatlin M.D.   On: 06/21/2024 23:02   DG CHEST PORT 1 VIEW Result Date: 06/21/2024 CLINICAL DATA:  Chest tube EXAM: PORTABLE  CHEST 1 VIEW COMPARISON:  Radiograph and CT earlier today FINDINGS: Endotracheal tube tip in the intrathoracic trachea 3.6 cm from the carina. Subdiaphragmatic enteric tube. Right apically oriented chest tube. No apparent pneumothorax. Diffuse interstitial coarsening. Known pleural effusions and airspace opacities were better appreciated on CT. Defibrillator pad. Stable cardiomediastinal silhouette. Extensive pneumomediastinum and subcutaneous emphysema. IMPRESSION: 1. Right apically oriented chest tube. No apparent pneumothorax. 2. Extensive pneumomediastinum and subcutaneous emphysema. 3. Known pleural effusions and airspace opacities were better appreciated on CT. Electronically Signed   By: Norman Gatlin M.D.   On: 06/21/2024 23:01   CT CHEST WO CONTRAST Result Date: 06/21/2024 CLINICAL DATA:  Pneumothorax, code sepsis EXAM: CT CHEST WITHOUT CONTRAST TECHNIQUE: Multidetector CT imaging of the chest was performed following the standard protocol without IV contrast. RADIATION DOSE REDUCTION: This exam was performed according to the departmental dose-optimization program which includes automated exposure control, adjustment of the mA and/or kV according to patient size and/or use of iterative reconstruction technique. COMPARISON:  Radiograph 06/21/2024 FINDINGS: Cardiovascular: Normal heart size. No pericardial effusion. Aortic atherosclerotic calcification. Mediastinum/Nodes: Extensive pneumomediastinum with gas extending into the neck rhomboid musculature retroperitoneum, and peritoneum. Endotracheal tube in the intrathoracic trachea. Question diffuse esophageal wall thickening this not well evaluated without IV contrast. Lungs/Pleura: Small to moderate right pneumothorax. No evidence of mediastinal shift. Airspace opacities with a perihilar distribution greatest in the lower lobes. Moderate right and small left pleural effusions. Upper Abdomen: Hepatic steatosis.  No acute abnormality. Musculoskeletal:  Motion limits evaluation for rib fractures. Suspected fractures in the left 5th-7th ribs and the anterior right 4th-9th ribs. Question nondisplaced fracture of the manubrium. IMPRESSION: 1. Moderate right hydropneumothorax.  Small left pleural effusion. 2. Extensive pneumomediastinum with gas extending into the neck, retroperitoneum, and peritoneum. 3. Airspace opacities with a perihilar distribution greatest in the lower lobes. Findings may be due to aspiration, pneumonia, or  atelectasis. 4. Suspected fractures in the left 5th-7th ribs and the anterior right 4th-9th ribs. Question fracture of the manubrium. Motion limits evaluation. 5. Aortic Atherosclerosis (ICD10-I70.0). Electronically Signed   By: Norman Gatlin M.D.   On: 06/21/2024 18:24   DG CHEST PORT 1 VIEW Result Date: 06/21/2024 CLINICAL DATA:  [id number] Intubation of airway performed without difficulty [id number] EXAM: PORTABLE CHEST - 1 VIEW COMPARISON:  June 21, 2024 FINDINGS: Endotracheal tube terminates in the mid trachea. Esophagogastric tube is coiled within the upper esophagus. Overlying defibrillator pads noted. Bilateral perihilar interstitial opacities, more so on the left than the right. Small right pneumothorax. Subcutaneous gas and pneumomediastinum noted. No cardiomegaly. IMPRESSION: 1. Small right apical pneumothorax, measuring 1.1 cm at the lung apex. Pneumomediastinum and subcutaneous emphysema noted in the lower neck. A follow-up CT of the chest with IV contrast, if patient is able, would be recommended for further characterization. 2. Findings suggestive of interstitial edema, more so on the left than the right. 3. Well-positioned endotracheal tube. Esophagogastric tube is coiled upon itself in the upper esophagus. Removal and replacement recommended. Critical Value/emergent results were called by telephone at the time of interpretation on 06/21/2024 at 5:11 pm to provider [facility] , who verbally acknowledged these results.  Electronically Signed   By: Rogelia Myers M.D.   On: 06/21/2024 17:21   CT Head Wo Contrast Result Date: 06/21/2024 CLINICAL DATA:  Mental status change, altered mental status. Recent stroke. EXAM: CT HEAD WITHOUT CONTRAST TECHNIQUE: Contiguous axial images were obtained from the base of the skull through the vertex without intravenous contrast. RADIATION DOSE REDUCTION: This exam was performed according to the departmental dose-optimization program which includes automated exposure control, adjustment of the mA and/or kV according to patient size and/or use of iterative reconstruction technique. COMPARISON:  CT head 06/13/2024, MRI head 05/28/2024. FINDINGS: Brain: No acute intracranial hemorrhage. Redemonstrated subacute to chronic left MCA territory infarct. Remote left PCA territory infarct again noted additional remote watershed infarcts and remote infarct in the right cerebellum noted. Small remote infarcts in the left cerebellum. Remote lacunar infarct in the left lentiform nucleus. No edema, mass effect, or midline shift. The basilar cisterns are patent. No extra-axial fluid collection. Ventricles: Ex vacuo dilatation of the left lateral ventricle. Vascular: Stent noted within the left ICA. Atherosclerotic calcifications of the right carotid siphon. Skull: No acute or aggressive finding. Orbits: Orbits are symmetric. Sinuses: Mild mucosal thickening in the left sphenoid sinus. Other: Mastoid air cells are clear. IMPRESSION: No CT evidence of acute intracranial abnormality. Evolving subacute to chronic left MCA territory infarct corresponding to findings on prior MRI. Additional remote left PCA territory and watershed infarcts and remote infarcts in the cerebellum. Chronic microvascular ischemic changes. Electronically Signed   By: Donnice Mania M.D.   On: 06/21/2024 08:59   CT ABDOMEN PELVIS WO CONTRAST Result Date: 06/21/2024 CLINICAL DATA:  Sepsis EXAM: CT ABDOMEN AND PELVIS WITHOUT CONTRAST  TECHNIQUE: Multidetector CT imaging of the abdomen and pelvis was performed following the standard protocol without IV contrast. RADIATION DOSE REDUCTION: This exam was performed according to the departmental dose-optimization program which includes automated exposure control, adjustment of the mA and/or kV according to patient size and/or use of iterative reconstruction technique. COMPARISON:  06/14/2024 CT angiogram abdomen and pelvis FINDINGS: Lower chest: Small layering right pleural effusion with dependent right basilar mild atelectasis, new. Circumferential wall thickening in the fluid-filled patulous lower thoracic esophagus. Hepatobiliary: Normal liver size. No liver mass. Cholecystectomy. No biliary ductal dilatation.  Pancreas: Normal, with no mass or duct dilation. Spleen: Normal size. No mass. Adrenals/Urinary Tract: Normal adrenals. No hydronephrosis. No renal stones. No contour deforming renal masses. Bladder collapsed by indwelling Foley catheter. Stomach/Bowel: Distended fluid-filled stomach. Diffuse mild-to-moderate dilatation of the largely fluid-filled small bowel up to 3.5 cm diameter, without small bowel wall thickening. Normal appendix. Mildly distended fluid-filled right and transverse colon and splenic flexure of the colon. No large bowel wall thickening, diverticulosis or significant pericolonic fat stranding. Vascular/Lymphatic: Atherosclerotic nonaneurysmal abdominal aorta. Right common femoral central venous catheter terminates in the right common iliac vein. No pathologically enlarged lymph nodes in the abdomen or pelvis. Reproductive: Grossly normal uterus.  No adnexal mass. Other: No pneumoperitoneum, ascites or focal fluid collection. Musculoskeletal: No aggressive appearing focal osseous lesions. Mild thoracolumbar spondylosis. IMPRESSION: 1. Diffuse mild-to-moderate dilatation of the largely fluid-filled small bowel and mildly distended fluid-filled right and transverse colon and  splenic flexure of the colon, without bowel wall thickening. Findings are most suggestive of a paralytic ileus as can be seen with enterocolitis. 2. Chronic circumferential wall thickening in the fluid-filled patulous lower thoracic esophagus, most commonly due to reflux esophagitis, with Barrett's esophagus or neoplasm not excluded by CT. 3. Small layering right pleural effusion, new. 4.  Aortic Atherosclerosis (ICD10-I70.0). Electronically Signed   By: Selinda DELENA Blue M.D.   On: 06/21/2024 08:53   DG Chest Port 1 View Result Date: 06/21/2024 CLINICAL DATA:  AMS, SOB, recent stroke, ?aspiration. EXAM: PORTABLE CHEST 1 VIEW COMPARISON:  06/13/2024. FINDINGS: Low lung volume. Bilateral lung fields are clear. Bilateral costophrenic angles are clear. Normal cardio-mediastinal silhouette. No acute osseous abnormalities. The soft tissues are within normal limits. IMPRESSION: No active disease. Electronically Signed   By: Ree Molt M.D.   On: 06/21/2024 08:16   CT Angio Abd/Pel w/ and/or w/o Result Date: 06/14/2024 CLINICAL DATA:  Evaluate for occult/retroperitoneal bleed. Coffee-ground emesis. Anemia. EXAM: CTA ABDOMEN AND PELVIS WITHOUT AND WITH CONTRAST TECHNIQUE: Multidetector CT imaging of the abdomen and pelvis was performed using the standard protocol during bolus administration of intravenous contrast. Multiplanar reconstructed images and MIPs were obtained and reviewed to evaluate the vascular anatomy. RADIATION DOSE REDUCTION: This exam was performed according to the departmental dose-optimization program which includes automated exposure control, adjustment of the mA and/or kV according to patient size and/or use of iterative reconstruction technique. CONTRAST:  OMNIPAQUE  IOHEXOL  350 MG/ML SOLN COMPARISON:  CT abdomen and pelvis 06/03/2024 FINDINGS: VASCULAR Aorta: Normal caliber aorta without aneurysm, dissection, vasculitis or significant stenosis. Celiac: Patent without evidence of aneurysm,  dissection, vasculitis or significant stenosis. SMA: Patent without evidence of aneurysm, dissection, vasculitis or significant stenosis. Renals: Both renal arteries are patent without evidence of aneurysm, dissection, vasculitis, fibromuscular dysplasia or significant stenosis. IMA: Patent without evidence of aneurysm, dissection, vasculitis or significant stenosis. Inflow: Patent without evidence of aneurysm, dissection, vasculitis or significant stenosis. Proximal Outflow: Bilateral common femoral and visualized portions of the superficial and profunda femoral arteries are patent without evidence of aneurysm, dissection, vasculitis or significant stenosis. Veins: No obvious venous abnormality within the limitations of this arterial phase study. Review of the MIP images confirms the above findings. NON-VASCULAR Lower chest: There is new small amount of right lower lobe airspace disease. Hepatobiliary: The liver is enlarged. Gallbladder surgically absent. No biliary ductal dilatation. Pancreas: Unremarkable. No pancreatic ductal dilatation or surrounding inflammatory changes. Spleen: Normal in size without focal abnormality. Adrenals/Urinary Tract: There subcentimeter cysts in the kidneys. Otherwise, the kidneys, adrenal glands and bladder  are within normal limits. Stomach/Bowel: There is diffuse gastric wall thickening versus normal under distension. No surrounding inflammation. Appendix appears normal. No evidence of bowel wall thickening, distention, or inflammatory changes. No active gastrointestinal bleeding identified. There is sigmoid colon diverticulosis Lymphatic: Aortic atherosclerosis. No enlarged abdominal or pelvic lymph nodes. Reproductive: Uterus and bilateral adnexa are unremarkable. Other: No abdominal wall hernia or abnormality. No abdominopelvic ascites. There is no retroperitoneal hematoma. Musculoskeletal: No acute or significant osseous findings. IMPRESSION: 1. No evidence for active  gastrointestinal bleeding or retroperitoneal hematoma. 2. New small amount of right lower lobe airspace disease worrisome for pneumonia. 3. Diffuse gastric wall thickening versus normal under distension. Correlate clinically for gastritis. 4. Hepatomegaly. 5. Sigmoid colon diverticulosis. 6. Aortic atherosclerosis. Aortic Atherosclerosis (ICD10-I70.0). Electronically Signed   By: Greig Pique M.D.   On: 06/14/2024 22:21   CT Renal Stone Study Result Date: 06/13/2024 CLINICAL DATA:  58 year old female with abdomen and flank pain. History of stroke. EXAM: CT ABDOMEN AND PELVIS WITHOUT CONTRAST TECHNIQUE: Multidetector CT imaging of the abdomen and pelvis was performed following the standard protocol without IV contrast. RADIATION DOSE REDUCTION: This exam was performed according to the departmental dose-optimization program which includes automated exposure control, adjustment of the mA and/or kV according to patient size and/or use of iterative reconstruction technique. COMPARISON:  CT Abdomen and Pelvis 01/20/2019. FINDINGS: Lower chest: Normal heart size. Negative lung bases. No pericardial or pleural effusion. Mildly thickened appearance of the distal thoracic esophagus, appears to be circumferential and is similar to the 2020 comparison (series 2, image 4). Hepatobiliary: Chronic cholecystectomy and hepatic steatosis. Pancreas: Partially atrophied since 2020. Spleen: Negative. Adrenals/Urinary Tract: Normal adrenal glands. Nonobstructed kidneys. No nephrolithiasis. Symmetric diminutive ureters. Diminutive bladder. Small pelvic phleboliths. Stomach/Bowel: Fluid-filled small and large bowel loops throughout the abdomen and pelvis, fluid in the large bowel to the rectum. No abnormally dilated loops. No transition point. Moderate fluid distended stomach also. No pneumoperitoneum. No focal mesenteric inflammation identified. Normal appendix identified on coronal image 54. No free fluid. Vascular/Lymphatic:  Aortoiliac calcified atherosclerosis. Normal caliber abdominal aorta. No lymphadenopathy. Reproductive: Negative noncontrast appearance. Other: No pelvis free fluid. Musculoskeletal: Chronic lower lumbar disc degeneration with vacuum disc. No acute osseous abnormality identified. IMPRESSION: 1. Fluid-filled bowel throughout the abdomen and pelvis suggesting Enteritis/Diarrhea. Normal appendix. No evidence of bowel obstruction or perforation. 2. This circumferentially thickened appearance of the distal esophagus, similar to a 2020 CT. Consider acute or chronic Esophagitis. 3. No other acute or inflammatory process identified in the noncontrast abdomen or pelvis. Chronic cholecystectomy and hepatic steatosis. Aortic Atherosclerosis (ICD10-I70.0). Electronically Signed   By: VEAR Hurst M.D.   On: 06/13/2024 05:09   DG Chest Portable 1 View Result Date: 06/13/2024 CLINICAL DATA:  Suffered a stroke last week. EXAM: PORTABLE CHEST 1 VIEW COMPARISON:  May 28, 2024 FINDINGS: The heart size and mediastinal contours are within normal limits. There is no evidence of acute infiltrate, pleural effusion or pneumothorax. The visualized skeletal structures are unremarkable. IMPRESSION: No active disease. Electronically Signed   By: Suzen Dials M.D.   On: 06/13/2024 03:22   CT Head Wo Contrast Result Date: 06/13/2024 EXAM: CT HEAD WITHOUT CONTRAST 06/13/2024 03:13:05 AM TECHNIQUE: CT of the head was performed without the administration of intravenous contrast. Automated exposure control, iterative reconstruction, and/or weight based adjustment of the mA/kV was utilized to reduce the radiation dose to as low as reasonably achievable. COMPARISON: MRI brain dated 05/28/2024. CLINICAL HISTORY: Mental status change, unknown cause. Pt is  drowsy and only responding to name being called at this time. Altered mental status. FINDINGS: BRAIN AND VENTRICLES: There is no acute intracranial hemorrhage, mass effect or midline shift. No  abnormal extra-axial fluid collection. The gray-white differentiation is maintained without an acute infarct. There is no hydrocephalus. Old left PCA (posterior cerebral artery) distribution and MCA (middle cerebral artery) watershed infarcts. Old right cerebellar infarct. Global cortical atrophy. Subcortical and periventricular small vessel ischemic changes. ORBITS: The visualized portion of the orbits demonstrate no acute abnormality. SINUSES: The visualized paranasal sinuses and mastoid air cells demonstrate no acute abnormality. SOFT TISSUES AND SKULL: No acute abnormality of the visualized skull or soft tissues. VASCULATURE: Intracranial atherosclerosis. Left ICA (internal carotid artery) stent. IMPRESSION: 1. No acute intracranial abnormality. 2. Old left PCA distribution and MCA watershed infarcts. Old right cerebellar infarct. 3. Atrophy with small vessel ischemic changes. Electronically signed by: Pinkie Pebbles MD 06/13/2024 03:18 AM EDT RP Workstation: [id number]   DG Swallowing Func-Speech Pathology Result Date: 05/31/2024 Table formatting from the original result was not included. Modified Barium Swallow Study Patient Details Name: EMERA BUSSIE MRN: [id number] Date of Birth: 1966-04-12 Today's Date: 05/31/2024 HPI/PMH: HPI: BECKY COLAN is a 58 yo female presenting to [facility] with R sided weakness and aphasia. CTA showed occlusion of the L ICA, L vertebral artery and L subclavian artery occlusions. TNK administered and transferred to [facility] for thrombectomy. Remained intubated after the procedure, 6/2-6/4. MRI shows large L MCA ischemic infarct and nonhemorrhagic L lentiform nucleus and periventricular white matter infarcts. PMH includes HTN, HLD, prior CVA s/p L carotid subclavian bypass graft, COPD, tobacco use, GERD with Barrett's esophagus, chronic pancreatitis, chronic opioid use, depression Clinical Impression: Clinical Impression: Swallow study was limited by reduced visibility at the level of the  vocal folds and UES due to shadow from shoulder. Pt had difficulty following commands to adjust positioning and even when anatomy was more visible, she did not stay in that position long. Her oral phase was fairly appropriate with liquid and pureed boluses, with only trace residue along the tongue and base of tongue, and minimal interlabial escape, and slow lingual transit with purees. Incomplete velopharyngeal closure was observed with small amounts of boluses flowing upward between the soft palate and posterior pharyngeal wall, but then falling back down into her pharynx. Pharyngeally there is reduced movement of the hyoid, larynx, and epiglottis. Base of tongue retraction and pharyngeal squeeze are reduced. This impacts airway closure more than it does her efficiency. She has pretty consistent penetration across consistencies, with ejection of penetrates with purees (PAS 2) and a little frank penetration with nectar and honey (PAS 3), although with most of the barium clearing spontaneously. Pt had intermittent coughing with thin liquids that was concerning for aspiration but could not be visualized, but when able to see the vocal folds, there were instances when a trace amount of thin liquids reached below them (PAS 7 or 8). Given current cognitive/communicative difficulties, she is not really appropriate for compensatory strategies at this time. Recommend that she advance to puree diet but with nectar (mildly) thick liquids. Factors that may increase risk of adverse event in presence of aspiration Noe & Lianne 2021): Factors that may increase risk of adverse event in presence of aspiration Noe & Lianne 2021): Reduced cognitive function; Dependence for feeding and/or oral hygiene Recommendations/Plan: Swallowing Evaluation Recommendations Swallowing Evaluation Recommendations Recommendations: PO diet PO Diet Recommendation: Dysphagia 1 (Pureed); Mildly thick liquids (Level 2, nectar thick) Liquid  Administration via:  Cup; Straw Medication Administration: Crushed with puree Supervision: Staff to assist with self-feeding; Full supervision/cueing for swallowing strategies Swallowing strategies  : Minimize environmental distractions; Slow rate; Small bites/sips; Check for pocketing or oral holding; Check for anterior loss Postural changes: Position pt fully upright for meals Oral care recommendations: Oral care BID (2x/day) Caregiver Recommendations: Avoid jello, ice cream, thin soups, popsicles; Remove water  pitcher Treatment Plan Treatment Plan Treatment recommendations: Therapy as outlined in treatment plan below Follow-up recommendations: Acute inpatient rehab (3 hours/day) Functional status assessment: Patient has had a recent decline in their functional status and demonstrates the ability to make significant improvements in function in a reasonable and predictable amount of time. Treatment frequency: Min 2x/week Treatment duration: 2 weeks Interventions: Aspiration precaution training; Compensatory techniques; Patient/family education; Trials of upgraded texture/liquids; Diet toleration management by SLP Recommendations Recommendations for follow up therapy are one component of a multi-disciplinary discharge planning process, led by the attending physician.  Recommendations may be updated based on patient status, additional functional criteria and insurance authorization. Assessment: Orofacial Exam: Orofacial Exam Oral Cavity - Dentition: Edentulous Anatomy: No data recorded Boluses Administered: Boluses Administered Boluses Administered: Thin liquids (Level 0); Mildly thick liquids (Level 2, nectar thick); Moderately thick liquids (Level 3, honey thick); Puree  Oral Impairment Domain: Oral Impairment Domain Lip Closure: Interlabial escape, no progression to anterior lip Tongue control during bolus hold: Posterior escape of greater than half of bolus Bolus preparation/mastication: -- (deferred given minimal  mastication observed at bedside) Bolus transport/lingual motion: Slow tongue motion Oral residue: Trace residue lining oral structures Location of oral residue : Tongue Initiation of pharyngeal swallow : Pyriform sinuses  Pharyngeal Impairment Domain: Pharyngeal Impairment Domain Soft palate elevation: Trace column of contrast or air between SP and PW Laryngeal elevation: Partial superior movement of thyroid  cartilage/partial approximation of arytenoids to epiglottic petiole Anterior hyoid excursion: Partial anterior movement Epiglottic movement: Partial inversion Laryngeal vestibule closure: Incomplete, narrow column air/contrast in laryngeal vestibule Pharyngeal stripping wave : Present - diminished Pharyngeal contraction (A/P view only): N/A Pharyngoesophageal segment opening: Complete distension and complete duration, no obstruction of flow (as can be viewed - limited visibility) Tongue base retraction: Narrow column of contrast or air between tongue base and PPW Pharyngeal residue: Trace residue within or on pharyngeal structures Location of pharyngeal residue: Tongue base  Esophageal Impairment Domain: No data recorded Pill: No data recorded Penetration/Aspiration Scale Score: Penetration/Aspiration Scale Score 2.  Material enters airway, remains ABOVE vocal cords then ejected out: Puree 3.  Material enters airway, remains ABOVE vocal cords and not ejected out: Mildly thick liquids (Level 2, nectar thick); Moderately thick liquids (Level 3, honey thick) 8.  Material enters airway, passes BELOW cords without attempt by patient to eject out (silent aspiration) : Thin liquids (Level 0) Compensatory Strategies: No data recorded  General Information: Caregiver present: No  Diet Prior to this Study: Full liquid diet; Thin liquids (Level 0)   Temperature : Normal   Respiratory Status: WFL   Supplemental O2: None (Room air)   History of Recent Intubation: Yes  Behavior/Cognition: Alert; Cooperative; Requires cueing  Self-Feeding Abilities: Needs assist with self-feeding Baseline vocal quality/speech: Not observed No data recorded No data recorded Exam Limitations: Limited visibility Goal Planning: Prognosis for improved oropharyngeal function: Good Barriers to Reach Goals: Cognitive deficits; Language deficits No data recorded Patient/Family Stated Goal: difficulty stating Consulted and agree with results and recommendations: Patient; Nurse; Dietitian Pain: Pain Assessment Pain Assessment: Faces Faces Pain Scale: 0 Facial Expression: 0 Body  Movements: 0 Muscle Tension: 0 Compliance with ventilator (intubated pts.): N/A Vocalization (extubated pts.): 0 CPOT Total: 0 End of Session: Start Time:SLP Start Time (ACUTE ONLY): 1154 Stop Time: SLP Stop Time (ACUTE ONLY): 1205 Time Calculation:SLP Time Calculation (min) (ACUTE ONLY): 11 min Charges: SLP Evaluations $ SLP Speech Visit: 1 Visit SLP Evaluations $MBS Swallow: 1 Procedure $Swallowing Treatment: 1 Procedure SLP visit diagnosis: SLP Visit Diagnosis: Dysphagia, oropharyngeal phase (R13.12) Past Medical History: Past Medical History: Diagnosis Date  Arthritis   qwhere (02/22/2018)  Barrett's esophagus with esophagitis 03/26/2013  Chronic abdominal pain   Chronic lower back pain   Chronic pancreatitis (HCC)   COPD (chronic obstructive pulmonary disease) (HCC)   Depression   Dyspnea   GERD (gastroesophageal reflux disease)   Heart murmur   Hiatal hernia   High cholesterol   Hypertension   Peptic ulcer   Pneumonia 2000s X 1  Stroke (HCC)   Tobacco use 03/26/2013 Past Surgical History: Past Surgical History: Procedure Laterality Date  AORTIC ARCH ANGIOGRAPHY N/A 01/19/2018  Procedure: AORTIC ARCH ANGIOGRAPHY;  Surgeon: Harvey Carlin BRAVO, MD;  [facility]: [facility];  Service: Cardiovascular;  Laterality: N/A;  BIOPSY  04/17/2017  Procedure: BIOPSY;  Surgeon: Lamar CHRISTELLA Hollingshead, MD;  Location: [facility];  Service: Endoscopy;;  esophageal  CARDIAC CATHETERIZATION     CAROTID-SUBCLAVIAN BYPASS GRAFT Left 03/19/2018  Procedure: BYPASS GRAFT CAROTID-SUBCLAVIAN;  Surgeon: Harvey Carlin BRAVO, MD;  Location: [facility] OR;  Service: Vascular;  Laterality: Left;  CESAREAN SECTION  1985; 1988  COLONOSCOPY WITH PROPOFOL  N/A 04/17/2017  Procedure: COLONOSCOPY WITH PROPOFOL ;  Surgeon: Lamar CHRISTELLA Hollingshead, MD;  [facility];  Service: Endoscopy;  Laterality: N/A;  100  ESOPHAGOGASTRODUODENOSCOPY  Oct 2011  Dr. Golda: ulcer in distal esophagus, soft stricture at GE junction s/p balloon dilation PATH: BARRETT'S  ESOPHAGOGASTRODUODENOSCOPY (EGD) WITH ESOPHAGEAL DILATION N/A 03/27/2013  Procedure: ESOPHAGOGASTRODUODENOSCOPY (EGD) WITH ESOPHAGEAL DILATION;  Surgeon: Lamar CHRISTELLA Hollingshead, MD;  [facility];  Service: Endoscopy;  Laterality: N/A;  possible dilation  ESOPHAGOGASTRODUODENOSCOPY (EGD) WITH PROPOFOL  N/A 01/02/2017  Procedure: ESOPHAGOGASTRODUODENOSCOPY (EGD) WITH PROPOFOL ;  Surgeon: Lamar CHRISTELLA Hollingshead, MD;  [facility];  Service: Endoscopy;  Laterality: N/A;  with possible esophageal dilation  ESOPHAGOGASTRODUODENOSCOPY (EGD) WITH PROPOFOL  N/A 04/17/2017  Procedure: ESOPHAGOGASTRODUODENOSCOPY (EGD) WITH PROPOFOL ;  Surgeon: Lamar CHRISTELLA Hollingshead, MD;  [facility];  Service: Endoscopy;  Laterality: N/A;  EUS N/A 05/09/2013  Procedure: UPPER ENDOSCOPIC ULTRASOUND (EUS) LINEAR;  Surgeon: Toribio SHAUNNA Cedar, MD;  Location: [facility];  Service: Endoscopy;  Laterality: N/A;  FRACTURE SURGERY    pt. denies  IR ANGIO INTRA EXTRACRAN SEL COM CAROTID INNOMINATE BILAT MOD SED  09/06/2022  IR ANGIO INTRA EXTRACRAN SEL COM CAROTID INNOMINATE BILAT MOD SED  09/13/2022  IR ANGIO VERTEBRAL SEL SUBCLAVIAN INNOMINATE BILAT MOD SED  09/13/2022  IR ANGIO VERTEBRAL SEL SUBCLAVIAN INNOMINATE UNI R MOD SED  09/06/2022  IR ANGIOGRAM FOLLOW UP STUDY  09/13/2022  IR CT HEAD LTD  09/08/2022  IR CT HEAD LTD  05/27/2024  IR CT HEAD LTD  05/27/2024  IR NEURO EACH ADD'L AFTER BASIC UNI LEFT (MS)  09/13/2022  IR PATIENT  EVAL TECH 0-60 MINS  05/28/2024  IR PERCUTANEOUS ART THROMBECTOMY/INFUSION INTRACRANIAL INC DIAG ANGIO  05/27/2024  IR TRANSCATH/EMBOLIZ  09/08/2022  IR US  GUIDE VASC ACCESS RIGHT  09/08/2022  IR US  GUIDE VASC ACCESS RIGHT  09/06/2022  KNEE ARTHROSCOPY Right   LAPAROSCOPIC CHOLECYSTECTOMY    LEFT HEART  CATH AND CORONARY ANGIOGRAPHY N/A 02/26/2018  Procedure: LEFT HEART CATH AND CORONARY ANGIOGRAPHY;  Surgeon: Verlin Lonni BIRCH, MD;  [facility];  Service: Cardiovascular;  Laterality: N/A;  MALONEY DILATION N/A 04/17/2017  Procedure: AGAPITO DILATION;  Surgeon: Lamar CHRISTELLA Hollingshead, MD;  [facility];  Service: Endoscopy;  Laterality: N/A;  PATELLA FRACTURE SURGERY Left   POLYPECTOMY  04/17/2017  Procedure: POLYPECTOMY;  Surgeon: Lamar CHRISTELLA Hollingshead, MD;  [facility];  Service: Endoscopy;;  RADIOLOGY WITH ANESTHESIA N/A 09/08/2022  Procedure: L ICA Aneurysm Embolazation;  Surgeon: Dolphus Carrion, MD;  Location: [facility];  Service: Radiology;  Laterality: N/A;  RADIOLOGY WITH ANESTHESIA N/A 05/27/2024  Procedure: RADIOLOGY WITH ANESTHESIA;  Surgeon: Radiologist, Medication, MD;  Location: [facility];  Service: Radiology;  Laterality: N/A;  TEE WITHOUT CARDIOVERSION N/A 01/23/2019  Procedure: TRANSESOPHAGEAL ECHOCARDIOGRAM (TEE);  Surgeon: Jeffrie Oneil BROCKS, MD;  Location: [facility] ENDOSCOPY;  Service: Cardiovascular;  Laterality: N/A;  TUBAL LIGATION  1992  UPPER EXTREMITY ANGIOGRAPHY N/A 01/19/2018  Procedure: UPPER EXTREMITY ANGIOGRAPHY;  Surgeon: Harvey Carlin BRAVO, MD;  [facility];  Service: Cardiovascular;  Laterality: N/A; Leita SAILOR., M.A. CCC-SLP Acute Rehabilitation Services Office: [phone] Secure chat preferred 05/31/2024, 1:20 PM  DG Abd Portable 1V Result Date: 05/29/2024 CLINICAL DATA:  Feeding tube placement. EXAM: PORTABLE ABDOMEN - 1 VIEW COMPARISON:  May 27, 2024. FINDINGS: Distal tip of feeding tube is seen in expected position of distal stomach or proximal duodenum.  IMPRESSION: Feeding tube tip seen in expected position of distal stomach or proximal duodenum. Electronically Signed   By: Lynwood Landy Raddle M.D.   On: 05/29/2024 13:53   MR BRAIN WO CONTRAST Result Date: 05/29/2024 EXAM: MRI BRAIN WITHOUT CONTRAST 05/28/2024 04:35:37 PM TECHNIQUE: Multiplanar multisequence MRI of the head/brain was performed without the administration of intravenous contrast. COMPARISON: CT head without contrast 05/28/2024. MR head without contrast 05/19/2023. CLINICAL HISTORY: Stroke, follow up; 24 hrs post-TNK r/o hemorrhagic conversion. Revascularization of occluded left internal carotid artery 05/27/2024. FINDINGS: BRAIN AND VENTRICLES: A large acute nonhemorrhagic left MCA territory ischemic infarct is present. Acute nonhemorrhagic infarct is present in the posterior left lentiform nucleus. Acute nonhemorrhagic left periventricular acute white matter infarcts are present. No acute hemorrhage is present. Remote cortical infarcts are present in the left parietal lobe and along the watershed distribution over the left hemisphere. Remote inferior cerebellar infarcts are present, right greater than left. No mass. No midline shift. No hydrocephalus. The sella is unremarkable. Normal flow voids. ORBITS: No acute abnormality. SINUSES AND MASTOIDS: Fluid in the nasopharynx is likely secondary to intubation. Moderate mucosal disease is present in the sphenoid sinus bilaterally and posterior maxillary sinuses. Mild mucosal thickening is present in the anterior ethmoid air cells and inferior frontal sinuses. BONES AND SOFT TISSUES: Normal marrow signal. No acute soft tissue abnormality. IMPRESSION: 1. No acute hemorrhage. 2. Large acute nonhemorrhagic left MCA territory ischemic infarct. 3. Acute nonhemorrhagic infarct in the posterior left lentiform nucleus. 4. Acute nonhemorrhagic left periventricular white matter infarcts. 5. Remote cortical infarcts in the left parietal lobe and along the watershed  distribution over the left hemisphere. Remote inferior cerebellar infarcts, right greater than left. 6. Moderate mucosal disease in the sphenoid sinus bilaterally and posterior maxillary sinuses. Mild mucosal thickening in the anterior ethmoid air cells and inferior frontal sinuses. The pertinent results were texted to Dr. Vanessa via the [facility] system at 5:22am. Electronically signed by: Lonni Necessary MD 05/29/2024 05:23 AM EDT RP Workstation:  [id number]   ECHOCARDIOGRAM COMPLETE BUBBLE STUDY Result Date: 05/28/2024    ECHOCARDIOGRAM REPORT   Patient Name:   CONSTANCIA GEETING Date of Exam: 05/28/2024 Medical Rec #:  [id number]     Height:       67.0 in Accession #:    [id number]    Weight:       224.9 lb Date of Birth:  06/30/1966     BSA:          2.126 m Patient Age:    57 years      BP:           120/61 mmHg Patient Gender: F             HR:           86 bpm. Exam Location:  Inpatient Procedure: 2D Echo, Color Doppler and Cardiac Doppler (Both Spectral and Color            Flow Doppler were utilized during procedure). Indications:    Stroke  History:        Patient has prior history of Echocardiogram examinations, most                 recent 03/31/2023. Risk Factors:Hypertension.  Sonographer:    Benard Stallion Referring Phys: [id number] ELIDA HERO STACK IMPRESSIONS  1. Left ventricular ejection fraction, by estimation, is 65 to 70%. The left ventricle has hyperdynamic function. The left ventricle has no regional wall motion abnormalities. Left ventricular diastolic parameters are consistent with Grade I diastolic dysfunction (impaired relaxation).  2. Right ventricular systolic function is normal. The right ventricular size is normal. There is mildly elevated pulmonary artery systolic pressure. The estimated right ventricular systolic pressure is 37.1 mmHg.  3. The mitral valve is normal in structure. No evidence of mitral valve regurgitation. No evidence of mitral stenosis.  4. Increased aortic valve velocities  are at least in part secondary to hyperdynamic state/increased cardiac output. The aortic valve is tricuspid. There is mild thickening of the aortic valve. Aortic valve regurgitation is not visualized. Aortic valve sclerosis is present, with no evidence of aortic valve stenosis.  5. Agitated saline contrast bubble study was negative, with no evidence of any interatrial shunt. FINDINGS  Left Ventricle: Left ventricular ejection fraction, by estimation, is 65 to 70%. The left ventricle has hyperdynamic function. The left ventricle has no regional wall motion abnormalities. The left ventricular internal cavity size was small. There is no  left ventricular hypertrophy. Left ventricular diastolic parameters are consistent with Grade I diastolic dysfunction (impaired relaxation). Indeterminate filling pressures. Right Ventricle: The right ventricular size is normal. Right vetricular wall thickness was not well visualized. Right ventricular systolic function is normal. There is mildly elevated pulmonary artery systolic pressure. The tricuspid regurgitant velocity  is 2.92 m/s, and with an assumed right atrial pressure of 3 mmHg, the estimated right ventricular systolic pressure is 37.1 mmHg. Left Atrium: Left atrial size was normal in size. Right Atrium: Right atrial size was normal in size. Pericardium: There is no evidence of pericardial effusion. Mitral Valve: The mitral valve is normal in structure. No evidence of mitral valve regurgitation. No evidence of mitral valve stenosis. Tricuspid Valve: The tricuspid valve is normal in structure. Tricuspid valve regurgitation is trivial. Aortic Valve: Increased aortic valve velocities are at least in part secondary to hyperdynamic state/increased cardiac output. The aortic valve is tricuspid. There is mild thickening of the aortic valve. Aortic valve regurgitation is not visualized. Aortic valve sclerosis  is present, with no evidence of aortic valve stenosis. Aortic valve mean  gradient measures 15.7 mmHg. Aortic valve peak gradient measures 30.5 mmHg. Aortic valve area, by VTI measures 2.42 cm. Pulmonic Valve: The pulmonic valve was grossly normal. Pulmonic valve regurgitation is not visualized. No evidence of pulmonic stenosis. Aorta: The aortic root is normal in size and structure. IAS/Shunts: No atrial level shunt detected by color flow Doppler. Agitated saline contrast bubble study was negative, with no evidence of any interatrial shunt.  LEFT VENTRICLE PLAX 2D LVIDd:         4.80 cm   Diastology LVIDs:         3.00 cm   LV e' medial:    7.29 cm/s LV PW:         0.90 cm   LV E/e' medial:  12.5 LV IVS:        1.10 cm   LV e' lateral:   9.90 cm/s LVOT diam:     2.20 cm   LV E/e' lateral: 9.2 LV SV:         113 LV SV Index:   53 LVOT Area:     3.80 cm  LEFT ATRIUM           Index LA diam:      3.80 cm 1.79 cm/m LA Vol (A4C): 45.4 ml 21.36 ml/m  AORTIC VALVE AV Area (Vmax):    2.36 cm AV Area (Vmean):   2.39 cm AV Area (VTI):     2.42 cm AV Vmax:           276.00 cm/s AV Vmean:          180.000 cm/s AV VTI:            0.469 m AV Peak Grad:      30.5 mmHg AV Mean Grad:      15.7 mmHg LVOT Vmax:         171.00 cm/s LVOT Vmean:        113.000 cm/s LVOT VTI:          0.298 m LVOT/AV VTI ratio: 0.64  AORTA Ao Root diam: 2.90 cm MITRAL VALVE                TRICUSPID VALVE MV Area (PHT): 3.31 cm     TR Peak grad:   34.1 mmHg MV Decel Time: 229 msec     TR Vmax:        292.00 cm/s MV E velocity: 91.30 cm/s MV A velocity: 122.00 cm/s  SHUNTS MV E/A ratio:  0.75         Systemic VTI:  0.30 m                             Systemic Diam: 2.20 cm Jerel Croitoru MD Electronically signed by Jerel Balding MD Signature Date/Time: 05/28/2024/1:10:16 PM    Final    IR PATIENT EVAL TECH 0-60 MINS Result Date: 05/28/2024 Royston Powell DEL     05/28/2024 10:23 AM Left 4 french venous femoral sheath removed at 1000. Manual pressure applied to obtain hemostasis at 1010. Dressing applied and site reviewed  with [facility]. No acute complications. Distal pulses intact.    Microbiology Recent Results (from the past 240 hours)  Culture, blood (routine x 2)     Status: Abnormal   Collection Time: 06/21/24  7:22 AM   Specimen: BLOOD  Result Value Ref Range Status  Specimen Description   Final    BLOOD m. central Performed at [facility], [address], [place], [place] [address]    Special Requests   Final    AEROBIC BOTTLE ONLY Blood Culture results may not be optimal due to an inadequate volume of blood received in culture bottles Performed at [facility], [address] [place], [place] [address]    Culture  Setup Time   Final    GRAM POSITIVE COCCI AEROBIC BOTTLE ONLY Gram Stain Report Called to,Read Back By and Verified With: J. SKIEF ON 06/22/2024 @9 :19 BY T.HAMER  GRAM STAIN REVIEWED-AGREE WITH RESULT CRITICAL RESULT CALLED TO, READ BACK BY AND VERIFIED WITH: PHARMD POWELL BLUSH [id number] AT 1259 BY EC Performed at [facility], [address] [place], [place] [address]    Culture STAPHYLOCOCCUS EPIDERMIDIS (A)  Final   Report Status 06/25/2024 FINAL  Final   Organism ID, Bacteria STAPHYLOCOCCUS EPIDERMIDIS  Final      Susceptibility   Staphylococcus epidermidis - MIC*    CIPROFLOXACIN >=8 RESISTANT Resistant     ERYTHROMYCIN >=8 RESISTANT Resistant     GENTAMICIN <=0.5 SENSITIVE Sensitive     OXACILLIN >=4 RESISTANT Resistant     TETRACYCLINE 2 SENSITIVE Sensitive     VANCOMYCIN  2 SENSITIVE Sensitive     TRIMETH/SULFA 80 RESISTANT Resistant     CLINDAMYCIN  >=8 RESISTANT Resistant     RIFAMPIN <=0.5 SENSITIVE Sensitive     Inducible Clindamycin  NEGATIVE Sensitive     * STAPHYLOCOCCUS EPIDERMIDIS  Culture, blood (routine x 2)     Status: Abnormal   Collection Time: 06/21/24  7:22 AM   Specimen: BLOOD RIGHT HAND  Result Value Ref Range Status   Specimen Description   Final    BLOOD RIGHT HAND Performed at [facility], [address], [place], [place]  [address]    Special Requests   Final    BOTTLES DRAWN AEROBIC AND ANAEROBIC Blood Culture results may not be optimal due to an inadequate volume of blood received in culture bottles Performed at [facility], [address], [place], [place] [address]    Culture  Setup Time   Final    GRAM POSITIVE COCCI ANAEROBIC BOTTLE ONLY Gram Stain Report Called to,Read Back By and Verified With: J. SKIEF ON 06/22/2024 @9 :19 BY T.HAMER     Culture (A)  Final    STAPHYLOCOCCUS EPIDERMIDIS SUSCEPTIBILITIES PERFORMED ON PREVIOUS CULTURE WITHIN THE LAST 5 DAYS. Performed at [facility], [address], [place], [place] [address]    Report Status 06/25/2024 FINAL  Final  Blood Culture ID Panel (Reflexed)     Status: Abnormal   Collection Time: 06/21/24  7:22 AM  Result Value Ref Range Status   Enterococcus faecalis NOT DETECTED NOT DETECTED Final   Enterococcus Faecium NOT DETECTED NOT DETECTED Final   Listeria monocytogenes NOT DETECTED NOT DETECTED Final   Staphylococcus species DETECTED (A) NOT DETECTED Final    Comment: CRITICAL RESULT CALLED TO, READ BACK BY AND VERIFIED WITH: PHARMD HEATHER WILSON [phone] AT 1259 BY EC    Staphylococcus aureus (BCID) NOT DETECTED NOT DETECTED Final   Staphylococcus epidermidis DETECTED (A) NOT DETECTED Final    Comment: Methicillin (oxacillin) resistant coagulase negative staphylococcus. Possible blood culture contaminant (unless isolated from more than one blood culture draw or clinical case suggests pathogenicity). No antibiotic treatment is indicated for blood  culture contaminants. CRITICAL RESULT CALLED TO, READ BACK BY AND VERIFIED WITH: PHARMD  POWELL BLUSH [id number] AT 1259 BY EC    Staphylococcus lugdunensis NOT DETECTED NOT DETECTED Final   Streptococcus species NOT DETECTED NOT DETECTED Final   Streptococcus agalactiae NOT DETECTED NOT DETECTED Final   Streptococcus pneumoniae NOT DETECTED NOT DETECTED Final   Streptococcus pyogenes NOT  DETECTED NOT DETECTED Final   A.calcoaceticus-baumannii NOT DETECTED NOT DETECTED Final   Bacteroides fragilis NOT DETECTED NOT DETECTED Final   Enterobacterales NOT DETECTED NOT DETECTED Final   Enterobacter cloacae complex NOT DETECTED NOT DETECTED Final   Escherichia coli NOT DETECTED NOT DETECTED Final   Klebsiella aerogenes NOT DETECTED NOT DETECTED Final   Klebsiella oxytoca NOT DETECTED NOT DETECTED Final   Klebsiella pneumoniae NOT DETECTED NOT DETECTED Final   Proteus species NOT DETECTED NOT DETECTED Final   Salmonella species NOT DETECTED NOT DETECTED Final   Serratia marcescens NOT DETECTED NOT DETECTED Final   Haemophilus influenzae NOT DETECTED NOT DETECTED Final   Neisseria meningitidis NOT DETECTED NOT DETECTED Final   Pseudomonas aeruginosa NOT DETECTED NOT DETECTED Final   Stenotrophomonas maltophilia NOT DETECTED NOT DETECTED Final   Candida albicans NOT DETECTED NOT DETECTED Final   Candida auris NOT DETECTED NOT DETECTED Final   Candida glabrata NOT DETECTED NOT DETECTED Final   Candida krusei NOT DETECTED NOT DETECTED Final   Candida parapsilosis NOT DETECTED NOT DETECTED Final   Candida tropicalis NOT DETECTED NOT DETECTED Final   Cryptococcus neoformans/gattii NOT DETECTED NOT DETECTED Final   Methicillin resistance mecA/C DETECTED (A) NOT DETECTED Final    Comment: CRITICAL RESULT CALLED TO, READ BACK BY AND VERIFIED WITH: PHARMD POWELL BLUSH [phone] AT 1259 BY EC Performed at [facility], [address], [place], [place] [address]   Resp panel by RT-PCR (RSV, Flu A&B, Covid) Anterior Nasal Swab     Status: None   Collection Time: 06/21/24  7:35 AM   Specimen: Anterior Nasal Swab  Result Value Ref Range Status   SARS Coronavirus 2 by RT PCR NEGATIVE NEGATIVE Final    Comment: (NOTE) SARS-CoV-2 target nucleic acids are NOT DETECTED.  The SARS-CoV-2 RNA is generally detectable in upper respiratory specimens during the acute phase of infection. The  lowest concentration of SARS-CoV-2 viral copies this assay can detect is 138 copies/mL. A negative result does not preclude SARS-Cov-2 infection and should not be used as the sole basis for treatment or other patient management decisions. A negative result may occur with  improper specimen collection/handling, submission of specimen other than nasopharyngeal swab, presence of viral mutation(s) within the areas targeted by this assay, and inadequate number of viral copies(<138 copies/mL). A negative result must be combined with clinical observations, patient history, and epidemiological information. The expected result is Negative.  Fact Sheet for Patients:  BloggerCourse.com  Fact Sheet for Healthcare Providers:  SeriousBroker.it  This test is no t yet approved or cleared by the [place]  FDA and  has been authorized for detection and/or diagnosis of SARS-CoV-2 by FDA under an Emergency Use Authorization (EUA). This EUA will remain  in effect (meaning this test can be used) for the duration of the COVID-19 declaration under Section 564(b)(1) of the Act, 21 U.S.C.section 360bbb-3(b)(1), unless the authorization is terminated  or revoked sooner.       Influenza A by PCR NEGATIVE NEGATIVE Final   Influenza B by PCR NEGATIVE NEGATIVE Final    Comment: (NOTE) The Xpert Xpress SARS-CoV-2/FLU/RSV plus assay is intended as an aid in the diagnosis of influenza from Nasopharyngeal  swab specimens and should not be used as a sole basis for treatment. Nasal washings and aspirates are unacceptable for Xpert Xpress SARS-CoV-2/FLU/RSV testing.  Fact Sheet for Patients: BloggerCourse.com  Fact Sheet for Healthcare Providers: SeriousBroker.it  This test is not yet approved or cleared by the [place]  FDA and has been authorized for detection and/or diagnosis of SARS-CoV-2 by FDA under  an Emergency Use Authorization (EUA). This EUA will remain in effect (meaning this test can be used) for the duration of the COVID-19 declaration under Section 564(b)(1) of the Act, 21 U.S.C. section 360bbb-3(b)(1), unless the authorization is terminated or revoked.     Resp Syncytial Virus by PCR NEGATIVE NEGATIVE Final    Comment: (NOTE) Fact Sheet for Patients: BloggerCourse.com  Fact Sheet for Healthcare Providers: SeriousBroker.it  This test is not yet approved or cleared by the [place]  FDA and has been authorized for detection and/or diagnosis of SARS-CoV-2 by FDA under an Emergency Use Authorization (EUA). This EUA will remain in effect (meaning this test can be used) for the duration of the COVID-19 declaration under Section 564(b)(1) of the Act, 21 U.S.C. section 360bbb-3(b)(1), unless the authorization is terminated or revoked.  Performed at [facility], [address] [place], [place] [address]   C Difficile Quick Screen w PCR reflex     Status: None   Collection Time: 06/21/24 10:33 AM   Specimen: STOOL  Result Value Ref Range Status   C Diff antigen NEGATIVE NEGATIVE Final   C Diff toxin NEGATIVE NEGATIVE Final   C Diff interpretation No C. difficile detected.  Final    Comment: Performed at [facility], [address] [place], [place] [address]  MRSA Next Gen by PCR, Nasal     Status: None   Collection Time: 06/21/24 10:45 AM   Specimen: Nasal Mucosa; Nasal Swab  Result Value Ref Range Status   MRSA by PCR Next Gen NOT DETECTED NOT DETECTED Final    Comment: (NOTE) The GeneXpert MRSA Assay (FDA approved for NASAL specimens only), is one component of a comprehensive MRSA colonization surveillance program. It is not intended to diagnose MRSA infection nor to guide or monitor treatment for MRSA infections. Test performance is not FDA approved in patients less than 31 years old. Performed at [facility], [address] [place], [place] [address]   Gastrointestinal Panel by PCR , Stool     Status: None   Collection Time: 06/21/24  1:45 PM   Specimen: STOOL  Result Value Ref Range Status   Campylobacter species NOT DETECTED NOT DETECTED Final   Plesimonas shigelloides NOT DETECTED NOT DETECTED Final   Salmonella species NOT DETECTED NOT DETECTED Final   Yersinia enterocolitica NOT DETECTED NOT DETECTED Final   Vibrio species NOT DETECTED NOT DETECTED Final   Vibrio cholerae NOT DETECTED NOT DETECTED Final   Enteroaggregative E coli (EAEC) NOT DETECTED NOT DETECTED Final   Enteropathogenic E coli (EPEC) NOT DETECTED NOT DETECTED Final   Enterotoxigenic E coli (ETEC) NOT DETECTED NOT DETECTED Final   Shiga like toxin producing E coli (STEC) NOT DETECTED NOT DETECTED Final   Shigella/Enteroinvasive E coli (EIEC) NOT DETECTED NOT DETECTED Final   Cryptosporidium NOT DETECTED NOT DETECTED Final   Cyclospora cayetanensis NOT DETECTED NOT DETECTED Final   Entamoeba histolytica NOT DETECTED NOT DETECTED Final   Giardia lamblia NOT DETECTED NOT DETECTED Final   Adenovirus F40/41 NOT DETECTED NOT DETECTED Final   Astrovirus NOT DETECTED NOT DETECTED Final   Norovirus  GI/GII NOT DETECTED NOT DETECTED Final   Rotavirus A NOT DETECTED NOT DETECTED Final   Sapovirus (I, II, IV, and V) NOT DETECTED NOT DETECTED Final    Comment: Performed at [facility], [address] [place], [place] [address]  MRSA Next Gen by PCR, Nasal     Status: None   Collection Time: 06/21/24  9:38 PM   Specimen: Nasal Mucosa; Nasal Swab  Result Value Ref Range Status   MRSA by PCR Next Gen NOT DETECTED NOT DETECTED Final    Comment: (NOTE) The GeneXpert MRSA Assay (FDA approved for NASAL specimens only), is one component of a comprehensive MRSA colonization surveillance program. It is not intended to diagnose MRSA infection nor to guide or monitor treatment for MRSA infections. Test performance is not FDA  approved in patients less than 37 years old. Performed at [facility], [address] [place], [place] [address]   Urine Culture (for pregnant, neutropenic or urologic patients or patients with an indwelling urinary catheter)     Status: None   Collection Time: 06/24/24  4:26 PM   Specimen: Urine, Catheterized  Result Value Ref Range Status   Specimen Description URINE, CATHETERIZED  Final   Special Requests NONE  Final   Culture   Final    NO GROWTH Performed at [facility], [address], [place], [place] [address]    Report Status 06/25/2024 FINAL  Final  Resp panel by RT-PCR (RSV, Flu A&B, Covid) Anterior Nasal Swab     Status: None   Collection Time: 06/24/24  5:03 PM   Specimen: Anterior Nasal Swab  Result Value Ref Range Status   SARS Coronavirus 2 by RT PCR NEGATIVE NEGATIVE Final   Influenza A by PCR NEGATIVE NEGATIVE Final   Influenza B by PCR NEGATIVE NEGATIVE Final    Comment: (NOTE) The Xpert Xpress SARS-CoV-2/FLU/RSV plus assay is intended as an aid in the diagnosis of influenza from Nasopharyngeal swab specimens and should not be used as a sole basis for treatment. Nasal washings and aspirates are unacceptable for Xpert Xpress SARS-CoV-2/FLU/RSV testing.  Fact Sheet for Patients: BloggerCourse.com  Fact Sheet for Healthcare Providers: SeriousBroker.it  This test is not yet approved or cleared by the [place]  FDA and has been authorized for detection and/or diagnosis of SARS-CoV-2 by FDA under an Emergency Use Authorization (EUA). This EUA will remain in effect (meaning this test can be used) for the duration of the COVID-19 declaration under Section 564(b)(1) of the Act, 21 U.S.C. section 360bbb-3(b)(1), unless the authorization is terminated or revoked.     Resp Syncytial Virus by PCR NEGATIVE NEGATIVE Final    Comment: (NOTE) Fact Sheet for  Patients: BloggerCourse.com  Fact Sheet for Healthcare Providers: SeriousBroker.it  This test is not yet approved or cleared by the [place]  FDA and has been authorized for detection and/or diagnosis of SARS-CoV-2 by FDA under an Emergency Use Authorization (EUA). This EUA will remain in effect (meaning this test can be used) for the duration of the COVID-19 declaration under Section 564(b)(1) of the Act, 21 U.S.C. section 360bbb-3(b)(1), unless the authorization is terminated or revoked.  Performed at [facility], [address] [place], [place] [address]   Culture, blood (Routine X 2) w Reflex to ID Panel     Status: None (Preliminary result)   Collection Time: 06/24/24  6:36 PM   Specimen: BLOOD  Result Value Ref Range Status   Specimen Description BLOOD SITE  NOT SPECIFIED  Final   Special Requests   Final    BOTTLES DRAWN AEROBIC AND ANAEROBIC Blood Culture results may not be optimal due to an inadequate volume of blood received in culture bottles   Culture   Final    NO GROWTH 3 DAYS Performed at [facility], [address], [place], [place] [address]    Report Status PENDING  Incomplete  Culture, blood (Routine X 2) w Reflex to ID Panel     Status: None (Preliminary result)   Collection Time: 06/24/24  9:18 PM   Specimen: BLOOD  Result Value Ref Range Status   Specimen Description BLOOD BLOOD RIGHT HAND  Final   Special Requests   Final    BOTTLES DRAWN AEROBIC AND ANAEROBIC Blood Culture adequate volume   Culture   Final    NO GROWTH 3 DAYS Performed at [facility], [address], [place], [place] [address]    Report Status PENDING  Incomplete  Urine Culture     Status: None   Collection Time: 07/03/2024  4:48 AM   Specimen: Urine, Random  Result Value Ref Range Status   Specimen Description URINE, RANDOM  Final   Special Requests NONE Reflexed from [id number]  Final   Culture   Final    NO  GROWTH Performed at [facility], [address], [place], [place] [address]    Report Status Jul 14, 2024 FINAL  Final    Lab Basic Metabolic Panel: Recent Labs  Lab 06/25/24 1151 06/25/24 1159 06/25/24 1751 06/25/24 1756 06/25/24 2349 07/09/2024 0022 07/08/2024 0530 07/05/2024 0542 07/10/2024 1151 07/11/2024 1206  NA 139   < > 138   < > 141 141 143 141 144 144  K 4.1   < > 3.7   < > 3.5 3.3* 3.5 3.3* 3.3* 3.3*  CL 103  --  102  --  106  --  105  --  108  --   CO2 18*  --  19*  --  20*  --  17*  --  18*  --   GLUCOSE 115*  --  118*  --  87  --  69*  --  82  --   BUN 87*  --  84*  --  84*  --  83*  --  82*  --   CREATININE 4.29*  --  4.24*  --  4.43*  --  4.17*  --  4.17*  --   CALCIUM  8.0*  --  7.8*  --  7.6*  --  7.6*  --  7.7*  --   MG 2.3  --  2.2  --  2.3  --  2.4  --  2.2  --   PHOS 10.1*  --  9.2*  --  8.4*  --  8.6*  --  8.4*  --    < > = values in this interval not displayed.   Liver Function Tests: Recent Labs  Lab 06/25/24 1151 06/25/24 1751 06/25/24 2349 06/29/2024 0530 06/29/2024 1151  AST 52* 48* 44* 43* 40  ALT 117* 108* 98* 90* 83*  ALKPHOS 137* 145* 158* 175* 164*  BILITOT 0.9 0.6 0.6 0.5 0.6  PROT 4.6* 4.7* 4.7* 4.6* 4.6*  ALBUMIN <1.5* <1.5* <1.5* <1.5* <1.5*   Recent Labs  Lab 06/25/24 1151 06/25/24 1751 06/25/24 2349 07/06/2024 0530 07/11/2024 1151  LIPASE 202* 209* 212* 194* 201*  AMYLASE 116* 107* 106* 106* 103*   Recent Labs  Lab 06/21/24 0722  AMMONIA <13   CBC: Recent Labs  Lab 06/25/24 1151 06/25/24 1159 06/25/24 1751 06/25/24 1756 06/25/24 2349 07/14/2024 0022 07/22/2024 0530 07/18/2024 0542 07/19/2024 1151 07/11/2024 1206  WBC 26.7*  --  28.9*  --  24.3*  --  24.0*  --  22.8*  --   NEUTROABS 23.1*  --  24.3*  --  19.6*  --  19.4*  --  18.3*  --   HGB 7.5*   < > 8.4*   < > 8.0* 8.5* 7.8* 12.9 7.5* 7.5*  HCT 22.3*   < > 25.1*   < > 24.0* 25.0* 23.6* 38.0 22.6* 22.0*  MCV 90.3  --  89.0  --  88.9  --  88.4  --  89.0  --   PLT 294  --   291  --  278  --  266  --  242  --    < > = values in this interval not displayed.   Cardiac Enzymes: Recent Labs  Lab 06/25/24 1151 06/25/24 1751 06/25/24 2349 07/10/2024 0530 07/10/2024 1151  CKTOTAL 89 61 53 53 50  CKMB 10.9* 6.9* 7.7* 12.0* 6.1*   Sepsis Labs: Recent Labs  Lab 06/21/24 0845 06/21/24 1539 06/21/24 2138 06/21/24 2140 06/22/24 0431 06/24/24 1555 06/24/24 1711 06/25/24 1751 06/25/24 2349 07/03/2024 0530 07/21/2024 1151  WBC  --  27.6*   < >  --    < >  --    < > 28.9* 24.3* 24.0* 22.8*  LATICACIDVEN >9.0* >9.0*  --  1.8  --  1.7  --   --   --   --   --    < > = values in this interval not displayed.    Procedures/Operations     [employer] 2024/06/28, 9:16 AM

## 2024-07-26 DEATH — deceased

## 2024-10-02 LAB — YEAST SUSCEPTIBILITIES
Amphotericin B MIC: 1
Fluconazole Islt MIC: 4
ISAVUCONAZOLE MIC: 0.25
Itraconazole MIC: 0.5
Posaconazole MIC: 2
Voriconazole MIC: 0.25
